# Patient Record
Sex: Female | Born: 1950
Health system: Southern US, Community
[De-identification: ages and names within clinical notes are randomized; demographics above are authoritative.]

## PROBLEM LIST (undated history)

## (undated) ENCOUNTER — Emergency Department (HOSPITAL_COMMUNITY): Payer: Medicare Other

## (undated) DIAGNOSIS — N390 Urinary tract infection, site not specified: Secondary | ICD-10-CM

## (undated) DIAGNOSIS — I10 Essential (primary) hypertension: Secondary | ICD-10-CM

## (undated) DIAGNOSIS — G8929 Other chronic pain: Secondary | ICD-10-CM

## (undated) DIAGNOSIS — F201 Disorganized schizophrenia: Secondary | ICD-10-CM

## (undated) DIAGNOSIS — E785 Hyperlipidemia, unspecified: Secondary | ICD-10-CM

## (undated) DIAGNOSIS — E1165 Type 2 diabetes mellitus with hyperglycemia: Secondary | ICD-10-CM

## (undated) DIAGNOSIS — I5022 Chronic systolic (congestive) heart failure: Secondary | ICD-10-CM

## (undated) DIAGNOSIS — I69391 Dysphagia following cerebral infarction: Secondary | ICD-10-CM

## (undated) DIAGNOSIS — F209 Schizophrenia, unspecified: Secondary | ICD-10-CM

## (undated) DIAGNOSIS — I639 Cerebral infarction, unspecified: Secondary | ICD-10-CM

## (undated) DIAGNOSIS — K5792 Diverticulitis of intestine, part unspecified, without perforation or abscess without bleeding: Secondary | ICD-10-CM

## (undated) DIAGNOSIS — I739 Peripheral vascular disease, unspecified: Secondary | ICD-10-CM

## (undated) DIAGNOSIS — I7123 Aneurysm of the descending thoracic aorta, without rupture: Secondary | ICD-10-CM

## (undated) DIAGNOSIS — I119 Hypertensive heart disease without heart failure: Secondary | ICD-10-CM

## (undated) DIAGNOSIS — I422 Other hypertrophic cardiomyopathy: Secondary | ICD-10-CM

## (undated) DIAGNOSIS — N182 Chronic kidney disease, stage 2 (mild): Secondary | ICD-10-CM

## (undated) DIAGNOSIS — I350 Nonrheumatic aortic (valve) stenosis: Secondary | ICD-10-CM

## (undated) DIAGNOSIS — M549 Dorsalgia, unspecified: Secondary | ICD-10-CM

## (undated) DIAGNOSIS — Z8659 Personal history of other mental and behavioral disorders: Secondary | ICD-10-CM

## (undated) DIAGNOSIS — E440 Moderate protein-calorie malnutrition: Secondary | ICD-10-CM

## (undated) DIAGNOSIS — I69322 Dysarthria following cerebral infarction: Secondary | ICD-10-CM

## (undated) DIAGNOSIS — I7121 Aneurysm of the ascending aorta, without rupture: Secondary | ICD-10-CM

## (undated) DIAGNOSIS — I2510 Atherosclerotic heart disease of native coronary artery without angina pectoris: Secondary | ICD-10-CM

## (undated) DIAGNOSIS — I420 Dilated cardiomyopathy: Secondary | ICD-10-CM

## (undated) DIAGNOSIS — I7143 Infrarenal abdominal aortic aneurysm, without rupture: Secondary | ICD-10-CM

## (undated) HISTORY — DX: Atherosclerotic heart disease of native coronary artery without angina pectoris: I25.10

## (undated) HISTORY — DX: Nonrheumatic aortic (valve) stenosis: I35.0

## (undated) HISTORY — DX: Hyperlipidemia, unspecified: E78.5

## (undated) HISTORY — DX: Other chronic pain: G89.29

## (undated) HISTORY — DX: Peripheral vascular disease, unspecified: I73.9

## (undated) HISTORY — DX: Infrarenal abdominal aortic aneurysm, without rupture: I71.43

## (undated) HISTORY — DX: Cerebral infarction, unspecified: I63.9

## (undated) HISTORY — PX: BUNIONECTOMY: SHX129

## (undated) HISTORY — DX: Other hypertrophic cardiomyopathy: I42.2

## (undated) HISTORY — DX: Dilated cardiomyopathy: I42.0

## (undated) HISTORY — DX: Disorganized schizophrenia: F20.1

## (undated) HISTORY — DX: Type 2 diabetes mellitus with hyperglycemia: E11.65

## (undated) HISTORY — DX: Personal history of other mental and behavioral disorders: Z86.59

## (undated) HISTORY — DX: Chronic kidney disease, stage 2 (mild): N18.2

## (undated) HISTORY — DX: Dorsalgia, unspecified: M54.9

## (undated) HISTORY — DX: Moderate protein-calorie malnutrition: E44.0

## (undated) HISTORY — DX: Dysphagia following cerebral infarction: I69.391

## (undated) HISTORY — DX: Aneurysm of the descending thoracic aorta, without rupture: I71.23

## (undated) HISTORY — DX: Chronic systolic (congestive) heart failure: I50.22

## (undated) HISTORY — DX: Aneurysm of the ascending aorta, without rupture: I71.21

## (undated) HISTORY — DX: Hypertensive heart disease without heart failure: I11.9

## (undated) HISTORY — DX: Dysarthria following cerebral infarction: I69.322

---

## 1997-06-23 ENCOUNTER — Inpatient Hospital Stay (HOSPITAL_COMMUNITY): Admission: AD | Admit: 1997-06-23 | Discharge: 1997-06-23 | Payer: Self-pay | Admitting: *Deleted

## 1998-01-24 ENCOUNTER — Emergency Department (HOSPITAL_COMMUNITY): Admission: EM | Admit: 1998-01-24 | Discharge: 1998-01-24 | Payer: Self-pay | Admitting: Emergency Medicine

## 1998-08-24 ENCOUNTER — Emergency Department (HOSPITAL_COMMUNITY): Admission: EM | Admit: 1998-08-24 | Discharge: 1998-08-24 | Payer: Self-pay | Admitting: Emergency Medicine

## 1998-10-08 ENCOUNTER — Emergency Department (HOSPITAL_COMMUNITY): Admission: EM | Admit: 1998-10-08 | Discharge: 1998-10-08 | Payer: Self-pay | Admitting: Emergency Medicine

## 1998-10-08 ENCOUNTER — Encounter: Payer: Self-pay | Admitting: Emergency Medicine

## 1998-11-03 ENCOUNTER — Emergency Department (HOSPITAL_COMMUNITY): Admission: EM | Admit: 1998-11-03 | Discharge: 1998-11-03 | Payer: Self-pay | Admitting: Emergency Medicine

## 1998-12-09 ENCOUNTER — Emergency Department (HOSPITAL_COMMUNITY): Admission: EM | Admit: 1998-12-09 | Discharge: 1998-12-09 | Payer: Self-pay | Admitting: Emergency Medicine

## 1999-02-15 ENCOUNTER — Emergency Department (HOSPITAL_COMMUNITY): Admission: EM | Admit: 1999-02-15 | Discharge: 1999-02-15 | Payer: Self-pay | Admitting: Emergency Medicine

## 1999-07-18 ENCOUNTER — Emergency Department (HOSPITAL_COMMUNITY): Admission: EM | Admit: 1999-07-18 | Discharge: 1999-07-18 | Payer: Self-pay | Admitting: Emergency Medicine

## 1999-08-24 ENCOUNTER — Emergency Department (HOSPITAL_COMMUNITY): Admission: EM | Admit: 1999-08-24 | Discharge: 1999-08-24 | Payer: Self-pay | Admitting: Emergency Medicine

## 1999-08-24 ENCOUNTER — Encounter: Payer: Self-pay | Admitting: Emergency Medicine

## 1999-09-27 ENCOUNTER — Emergency Department (HOSPITAL_COMMUNITY): Admission: EM | Admit: 1999-09-27 | Discharge: 1999-09-27 | Payer: Self-pay | Admitting: *Deleted

## 1999-10-26 ENCOUNTER — Emergency Department (HOSPITAL_COMMUNITY): Admission: EM | Admit: 1999-10-26 | Discharge: 1999-10-26 | Payer: Self-pay | Admitting: Emergency Medicine

## 1999-12-06 ENCOUNTER — Inpatient Hospital Stay (HOSPITAL_COMMUNITY): Admission: EM | Admit: 1999-12-06 | Discharge: 1999-12-07 | Payer: Self-pay | Admitting: *Deleted

## 2003-02-17 ENCOUNTER — Emergency Department (HOSPITAL_COMMUNITY): Admission: EM | Admit: 2003-02-17 | Discharge: 2003-02-17 | Payer: Self-pay | Admitting: Emergency Medicine

## 2003-03-25 ENCOUNTER — Emergency Department (HOSPITAL_COMMUNITY): Admission: EM | Admit: 2003-03-25 | Discharge: 2003-03-25 | Payer: Self-pay | Admitting: Emergency Medicine

## 2003-04-03 ENCOUNTER — Ambulatory Visit (HOSPITAL_COMMUNITY): Admission: RE | Admit: 2003-04-03 | Discharge: 2003-04-03 | Payer: Self-pay | Admitting: Orthopedic Surgery

## 2003-04-15 ENCOUNTER — Ambulatory Visit (HOSPITAL_COMMUNITY): Admission: RE | Admit: 2003-04-15 | Discharge: 2003-04-15 | Payer: Self-pay | Admitting: Orthopedic Surgery

## 2003-04-15 ENCOUNTER — Ambulatory Visit (HOSPITAL_BASED_OUTPATIENT_CLINIC_OR_DEPARTMENT_OTHER): Admission: RE | Admit: 2003-04-15 | Discharge: 2003-04-16 | Payer: Self-pay | Admitting: Orthopedic Surgery

## 2003-07-01 ENCOUNTER — Encounter: Admission: RE | Admit: 2003-07-01 | Discharge: 2003-09-29 | Payer: Self-pay | Admitting: Orthopedic Surgery

## 2003-09-30 ENCOUNTER — Encounter: Admission: RE | Admit: 2003-09-30 | Discharge: 2003-10-29 | Payer: Self-pay | Admitting: Orthopedic Surgery

## 2003-12-23 ENCOUNTER — Inpatient Hospital Stay (HOSPITAL_COMMUNITY): Admission: RE | Admit: 2003-12-23 | Discharge: 2003-12-25 | Payer: Self-pay | Admitting: Orthopedic Surgery

## 2004-01-26 ENCOUNTER — Encounter: Admission: RE | Admit: 2004-01-26 | Discharge: 2004-04-25 | Payer: Self-pay | Admitting: Orthopedic Surgery

## 2004-02-23 ENCOUNTER — Emergency Department (HOSPITAL_COMMUNITY): Admission: EM | Admit: 2004-02-23 | Discharge: 2004-02-23 | Payer: Self-pay | Admitting: Emergency Medicine

## 2004-04-26 ENCOUNTER — Encounter: Admission: RE | Admit: 2004-04-26 | Discharge: 2004-05-31 | Payer: Self-pay | Admitting: Orthopedic Surgery

## 2004-10-06 ENCOUNTER — Encounter: Admission: RE | Admit: 2004-10-06 | Discharge: 2004-10-06 | Payer: Self-pay | Admitting: Family Medicine

## 2004-10-19 ENCOUNTER — Encounter: Admission: RE | Admit: 2004-10-19 | Discharge: 2004-10-19 | Payer: Self-pay | Admitting: Family Medicine

## 2004-12-15 ENCOUNTER — Emergency Department (HOSPITAL_COMMUNITY): Admission: EM | Admit: 2004-12-15 | Discharge: 2004-12-15 | Payer: Self-pay | Admitting: Emergency Medicine

## 2004-12-25 ENCOUNTER — Emergency Department (HOSPITAL_COMMUNITY): Admission: EM | Admit: 2004-12-25 | Discharge: 2004-12-25 | Payer: Self-pay | Admitting: Emergency Medicine

## 2005-02-11 ENCOUNTER — Emergency Department (HOSPITAL_COMMUNITY): Admission: EM | Admit: 2005-02-11 | Discharge: 2005-02-12 | Payer: Self-pay | Admitting: Emergency Medicine

## 2005-02-13 ENCOUNTER — Emergency Department (HOSPITAL_COMMUNITY): Admission: EM | Admit: 2005-02-13 | Discharge: 2005-02-13 | Payer: Self-pay | Admitting: Emergency Medicine

## 2005-02-23 ENCOUNTER — Emergency Department (HOSPITAL_COMMUNITY): Admission: EM | Admit: 2005-02-23 | Discharge: 2005-02-23 | Payer: Self-pay | Admitting: Emergency Medicine

## 2005-02-24 ENCOUNTER — Other Ambulatory Visit: Admission: RE | Admit: 2005-02-24 | Discharge: 2005-02-24 | Payer: Self-pay | Admitting: Obstetrics and Gynecology

## 2005-02-26 ENCOUNTER — Inpatient Hospital Stay (HOSPITAL_COMMUNITY): Admission: AD | Admit: 2005-02-26 | Discharge: 2005-02-28 | Payer: Self-pay | Admitting: Psychiatry

## 2005-02-26 ENCOUNTER — Ambulatory Visit: Payer: Self-pay | Admitting: Psychiatry

## 2005-06-29 ENCOUNTER — Emergency Department (HOSPITAL_COMMUNITY): Admission: EM | Admit: 2005-06-29 | Discharge: 2005-06-29 | Payer: Self-pay | Admitting: Emergency Medicine

## 2005-07-11 ENCOUNTER — Ambulatory Visit: Payer: Self-pay | Admitting: Pulmonary Disease

## 2005-07-11 ENCOUNTER — Inpatient Hospital Stay (HOSPITAL_COMMUNITY): Admission: EM | Admit: 2005-07-11 | Discharge: 2005-07-14 | Payer: Self-pay | Admitting: Emergency Medicine

## 2005-08-20 ENCOUNTER — Inpatient Hospital Stay (HOSPITAL_COMMUNITY): Admission: AD | Admit: 2005-08-20 | Discharge: 2005-08-20 | Payer: Self-pay | Admitting: Obstetrics and Gynecology

## 2005-09-09 ENCOUNTER — Emergency Department (HOSPITAL_COMMUNITY): Admission: EM | Admit: 2005-09-09 | Discharge: 2005-09-10 | Payer: Self-pay | Admitting: Emergency Medicine

## 2005-10-05 ENCOUNTER — Emergency Department (HOSPITAL_COMMUNITY): Admission: EM | Admit: 2005-10-05 | Discharge: 2005-10-06 | Payer: Self-pay | Admitting: Emergency Medicine

## 2005-10-11 ENCOUNTER — Emergency Department (HOSPITAL_COMMUNITY): Admission: EM | Admit: 2005-10-11 | Discharge: 2005-10-12 | Payer: Self-pay | Admitting: Emergency Medicine

## 2005-10-13 ENCOUNTER — Emergency Department (HOSPITAL_COMMUNITY): Admission: EM | Admit: 2005-10-13 | Discharge: 2005-10-13 | Payer: Self-pay | Admitting: Emergency Medicine

## 2005-10-18 ENCOUNTER — Emergency Department (HOSPITAL_COMMUNITY): Admission: EM | Admit: 2005-10-18 | Discharge: 2005-10-18 | Payer: Self-pay | Admitting: *Deleted

## 2009-01-04 DIAGNOSIS — I420 Dilated cardiomyopathy: Secondary | ICD-10-CM

## 2009-01-04 HISTORY — DX: Dilated cardiomyopathy: I42.0

## 2009-02-01 ENCOUNTER — Inpatient Hospital Stay (HOSPITAL_COMMUNITY): Admission: EM | Admit: 2009-02-01 | Discharge: 2009-02-06 | Payer: Self-pay | Admitting: Emergency Medicine

## 2009-02-02 ENCOUNTER — Encounter (INDEPENDENT_AMBULATORY_CARE_PROVIDER_SITE_OTHER): Payer: Self-pay | Admitting: Cardiovascular Disease

## 2009-03-06 HISTORY — PX: NM MYOCAR PERF WALL MOTION: HXRAD629

## 2009-04-06 DIAGNOSIS — I2510 Atherosclerotic heart disease of native coronary artery without angina pectoris: Secondary | ICD-10-CM

## 2009-04-06 HISTORY — DX: Atherosclerotic heart disease of native coronary artery without angina pectoris: I25.10

## 2009-04-09 ENCOUNTER — Ambulatory Visit (HOSPITAL_COMMUNITY)
Admission: RE | Admit: 2009-04-09 | Discharge: 2009-04-10 | Payer: Self-pay | Source: Home / Self Care | Admitting: Cardiology

## 2010-03-27 ENCOUNTER — Encounter: Payer: Self-pay | Admitting: Family Medicine

## 2010-04-23 ENCOUNTER — Encounter (HOSPITAL_COMMUNITY): Payer: Self-pay | Admitting: Radiology

## 2010-04-23 ENCOUNTER — Emergency Department (HOSPITAL_COMMUNITY)
Admission: EM | Admit: 2010-04-23 | Discharge: 2010-04-23 | Disposition: A | Discharge status: Home / Self Care | Payer: Medicare Other | Attending: Emergency Medicine | Admitting: Emergency Medicine

## 2010-04-23 ENCOUNTER — Emergency Department (HOSPITAL_COMMUNITY): Payer: Medicare Other

## 2010-04-23 DIAGNOSIS — J029 Acute pharyngitis, unspecified: Secondary | ICD-10-CM | POA: Insufficient documentation

## 2010-04-23 DIAGNOSIS — N83209 Unspecified ovarian cyst, unspecified side: Secondary | ICD-10-CM | POA: Insufficient documentation

## 2010-04-23 DIAGNOSIS — R109 Unspecified abdominal pain: Secondary | ICD-10-CM | POA: Insufficient documentation

## 2010-04-23 DIAGNOSIS — I10 Essential (primary) hypertension: Secondary | ICD-10-CM | POA: Insufficient documentation

## 2010-04-23 DIAGNOSIS — R112 Nausea with vomiting, unspecified: Secondary | ICD-10-CM | POA: Insufficient documentation

## 2010-04-23 DIAGNOSIS — J3489 Other specified disorders of nose and nasal sinuses: Secondary | ICD-10-CM | POA: Insufficient documentation

## 2010-04-23 DIAGNOSIS — J069 Acute upper respiratory infection, unspecified: Secondary | ICD-10-CM | POA: Insufficient documentation

## 2010-04-23 HISTORY — DX: Essential (primary) hypertension: I10

## 2010-04-23 LAB — CBC
HCT: 37.9 % (ref 36.0–46.0)
Hemoglobin: 13 g/dL (ref 12.0–15.0)
MCV: 90.7 fL (ref 78.0–100.0)
Platelets: 332 10*3/uL (ref 150–400)
RBC: 4.18 MIL/uL (ref 3.87–5.11)
RDW: 13.1 % (ref 11.5–15.5)
WBC: 10.4 10*3/uL (ref 4.0–10.5)

## 2010-04-23 LAB — COMPREHENSIVE METABOLIC PANEL
ALT: 15 U/L (ref 0–35)
AST: 19 U/L (ref 0–37)
Albumin: 3.8 g/dL (ref 3.5–5.2)
Alkaline Phosphatase: 80 U/L (ref 39–117)
BUN: 13 mg/dL (ref 6–23)
CO2: 28 mEq/L (ref 19–32)
Chloride: 99 mEq/L (ref 96–112)
Creatinine, Ser: 1.56 mg/dL — ABNORMAL HIGH (ref 0.4–1.2)
GFR calc Af Amer: 41 mL/min — ABNORMAL LOW (ref >60)
GFR calc non Af Amer: 34 mL/min — ABNORMAL LOW (ref >60)
Glucose, Bld: 128 mg/dL — ABNORMAL HIGH (ref 70–99)
Potassium: 3.8 mEq/L (ref 3.5–5.1)
Sodium: 135 mEq/L (ref 135–145)
Total Bilirubin: 0.4 mg/dL (ref 0.3–1.2)
Total Protein: 8.3 g/dL (ref 6.0–8.3)

## 2010-04-23 LAB — LIPASE, BLOOD: Lipase: 103 U/L — ABNORMAL HIGH (ref 11–59)

## 2010-04-23 LAB — URINALYSIS, ROUTINE W REFLEX MICROSCOPIC
Bilirubin Urine: NEGATIVE
Ketones, ur: NEGATIVE mg/dL
Protein, ur: 30 mg/dL — AB
Specific Gravity, Urine: 1.036 — ABNORMAL HIGH (ref 1.005–1.030)
Urine Glucose, Fasting: NEGATIVE mg/dL
Urobilinogen, UA: 0.2 mg/dL (ref 0.0–1.0)
pH: 6.5 (ref 5.0–8.0)

## 2010-04-23 LAB — DIFFERENTIAL
Basophils Absolute: 0 10*3/uL (ref 0.0–0.1)
Basophils Relative: 0 % (ref 0–1)
Eosinophils Absolute: 0.1 10*3/uL (ref 0.0–0.7)
Eosinophils Relative: 1 % (ref 0–5)
Lymphocytes Relative: 16 % (ref 12–46)
Lymphs Abs: 1.6 10*3/uL (ref 0.7–4.0)
Monocytes Absolute: 0.5 10*3/uL (ref 0.1–1.0)
Monocytes Relative: 5 % (ref 3–12)

## 2010-04-23 LAB — URINE MICROSCOPIC-ADD ON

## 2010-04-23 MED ORDER — IOHEXOL 300 MG/ML  SOLN
100.0000 mL | Freq: Once | INTRAMUSCULAR | Status: AC | PRN
  Administered 2010-04-23: 100 mL via INTRAVENOUS

## 2010-05-27 LAB — POCT I-STAT 3, ART BLOOD GAS (G3+)
Acid-Base Excess: 3 mmol/L — ABNORMAL HIGH (ref 0.0–2.0)
Bicarbonate: 26.7 mEq/L — ABNORMAL HIGH (ref 20.0–24.0)
O2 Saturation: 96 %
pO2, Arterial: 75 mmHg — ABNORMAL LOW (ref 80.0–100.0)

## 2010-05-27 LAB — POCT I-STAT 3, VENOUS BLOOD GAS (G3P V)
Bicarbonate: 27.2 mEq/L — ABNORMAL HIGH (ref 20.0–24.0)
O2 Saturation: 62 %
TCO2: 29 mmol/L (ref 0–100)
pH, Ven: 7.4 — ABNORMAL HIGH (ref 7.250–7.300)

## 2010-05-27 LAB — BASIC METABOLIC PANEL
BUN: 19 mg/dL (ref 6–23)
CO2: 29 mEq/L (ref 19–32)
Calcium: 9 mg/dL (ref 8.4–10.5)
Calcium: 9 mg/dL (ref 8.4–10.5)
Chloride: 102 mEq/L (ref 96–112)
Creatinine, Ser: 1.37 mg/dL — ABNORMAL HIGH (ref 0.4–1.2)
GFR calc Af Amer: 48 mL/min — ABNORMAL LOW (ref >60)
GFR calc Af Amer: 50 mL/min — ABNORMAL LOW (ref >60)
GFR calc non Af Amer: 40 mL/min — ABNORMAL LOW (ref >60)
GFR calc non Af Amer: 41 mL/min — ABNORMAL LOW (ref >60)
Glucose, Bld: 106 mg/dL — ABNORMAL HIGH (ref 70–99)
Potassium: 3.2 mEq/L — ABNORMAL LOW (ref 3.5–5.1)
Potassium: 3.3 mEq/L — ABNORMAL LOW (ref 3.5–5.1)
Sodium: 137 mEq/L (ref 135–145)
Sodium: 138 mEq/L (ref 135–145)

## 2010-05-27 LAB — URINALYSIS, MICROSCOPIC ONLY
Bilirubin Urine: NEGATIVE
Nitrite: NEGATIVE
Protein, ur: NEGATIVE mg/dL
Specific Gravity, Urine: 1.018 (ref 1.005–1.030)
Urobilinogen, UA: 0.2 mg/dL (ref 0.0–1.0)

## 2010-05-27 LAB — CBC
MCHC: 33.8 g/dL (ref 30.0–36.0)
MCV: 89.3 fL (ref 78.0–100.0)
Platelets: 232 10*3/uL (ref 150–400)
RDW: 18 % — ABNORMAL HIGH (ref 11.5–15.5)

## 2010-05-27 LAB — URINALYSIS, ROUTINE W REFLEX MICROSCOPIC
Bilirubin Urine: NEGATIVE
Glucose, UA: NEGATIVE mg/dL
Hgb urine dipstick: NEGATIVE
Protein, ur: NEGATIVE mg/dL
Urobilinogen, UA: 0.2 mg/dL (ref 0.0–1.0)

## 2010-05-27 LAB — GLUCOSE, CAPILLARY
Glucose-Capillary: 107 mg/dL — ABNORMAL HIGH (ref 70–99)
Glucose-Capillary: 94 mg/dL (ref 70–99)

## 2010-05-27 LAB — PROTIME-INR
INR: 1.1 (ref 0.00–1.49)
Prothrombin Time: 14.1 seconds (ref 11.6–15.2)

## 2010-06-07 LAB — LIPID PANEL
LDL Cholesterol: 57 mg/dL (ref 0–99)
VLDL: 16 mg/dL (ref 0–40)

## 2010-06-07 LAB — BASIC METABOLIC PANEL
BUN: 16 mg/dL (ref 6–23)
BUN: 16 mg/dL (ref 6–23)
BUN: 25 mg/dL — ABNORMAL HIGH (ref 6–23)
CO2: 31 mEq/L (ref 19–32)
Calcium: 8.2 mg/dL — ABNORMAL LOW (ref 8.4–10.5)
Calcium: 8.6 mg/dL (ref 8.4–10.5)
Chloride: 106 mEq/L (ref 96–112)
Chloride: 99 mEq/L (ref 96–112)
Creatinine, Ser: 1.39 mg/dL — ABNORMAL HIGH (ref 0.4–1.2)
Creatinine, Ser: 1.7 mg/dL — ABNORMAL HIGH (ref 0.4–1.2)
GFR calc Af Amer: 37 mL/min — ABNORMAL LOW (ref >60)
GFR calc Af Amer: 46 mL/min — ABNORMAL LOW (ref >60)
GFR calc non Af Amer: 31 mL/min — ABNORMAL LOW (ref >60)
GFR calc non Af Amer: 39 mL/min — ABNORMAL LOW (ref >60)
Glucose, Bld: 106 mg/dL — ABNORMAL HIGH (ref 70–99)
Glucose, Bld: 89 mg/dL (ref 70–99)
Potassium: 4 mEq/L (ref 3.5–5.1)
Potassium: 4 mEq/L (ref 3.5–5.1)
Sodium: 139 mEq/L (ref 135–145)

## 2010-06-07 LAB — CBC
HCT: 34.7 % — ABNORMAL LOW (ref 36.0–46.0)
MCV: 93.3 fL (ref 78.0–100.0)
Platelets: 263 10*3/uL (ref 150–400)
RDW: 15.8 % — ABNORMAL HIGH (ref 11.5–15.5)

## 2010-06-07 LAB — BRAIN NATRIURETIC PEPTIDE: Pro B Natriuretic peptide (BNP): 673 pg/mL — ABNORMAL HIGH (ref 0.0–100.0)

## 2010-06-07 LAB — MAGNESIUM: Magnesium: 1.8 mg/dL (ref 1.5–2.5)

## 2010-06-08 LAB — GLUCOSE, CAPILLARY
Glucose-Capillary: 117 mg/dL — ABNORMAL HIGH (ref 70–99)
Glucose-Capillary: 119 mg/dL — ABNORMAL HIGH (ref 70–99)

## 2010-06-08 LAB — BASIC METABOLIC PANEL
BUN: 32 mg/dL — ABNORMAL HIGH (ref 6–23)
CO2: 25 mEq/L (ref 19–32)
Calcium: 8.7 mg/dL (ref 8.4–10.5)
Chloride: 110 mEq/L (ref 96–112)
Creatinine, Ser: 1.82 mg/dL — ABNORMAL HIGH (ref 0.4–1.2)
GFR calc Af Amer: 33 mL/min — ABNORMAL LOW (ref >60)
GFR calc Af Amer: 35 mL/min — ABNORMAL LOW (ref >60)
GFR calc non Af Amer: 27 mL/min — ABNORMAL LOW (ref >60)
GFR calc non Af Amer: 29 mL/min — ABNORMAL LOW (ref >60)
Glucose, Bld: 114 mg/dL — ABNORMAL HIGH (ref 70–99)
Potassium: 4 mEq/L (ref 3.5–5.1)
Sodium: 140 mEq/L (ref 135–145)

## 2010-06-08 LAB — COMPREHENSIVE METABOLIC PANEL
Albumin: 2.8 g/dL — ABNORMAL LOW (ref 3.5–5.2)
Alkaline Phosphatase: 85 U/L (ref 39–117)
BUN: 27 mg/dL — ABNORMAL HIGH (ref 6–23)
CO2: 23 mEq/L (ref 19–32)
Chloride: 112 mEq/L (ref 96–112)
Glucose, Bld: 115 mg/dL — ABNORMAL HIGH (ref 70–99)
Potassium: 3.8 mEq/L (ref 3.5–5.1)
Total Bilirubin: 1.5 mg/dL — ABNORMAL HIGH (ref 0.3–1.2)

## 2010-06-08 LAB — TROPONIN I: Troponin I: 0.47 ng/mL — ABNORMAL HIGH (ref 0.00–0.06)

## 2010-06-08 LAB — URINALYSIS, ROUTINE W REFLEX MICROSCOPIC
Glucose, UA: NEGATIVE mg/dL
Ketones, ur: NEGATIVE mg/dL
Nitrite: NEGATIVE
Specific Gravity, Urine: 1.01 (ref 1.005–1.030)
pH: 5 (ref 5.0–8.0)

## 2010-06-08 LAB — CBC
HCT: 36.1 % (ref 36.0–46.0)
Hemoglobin: 11.7 g/dL — ABNORMAL LOW (ref 12.0–15.0)
Hemoglobin: 12 g/dL (ref 12.0–15.0)
MCHC: 32.8 g/dL (ref 30.0–36.0)
Platelets: 252 10*3/uL (ref 150–400)
RDW: 16 % — ABNORMAL HIGH (ref 11.5–15.5)
WBC: 5.8 10*3/uL (ref 4.0–10.5)

## 2010-06-08 LAB — BRAIN NATRIURETIC PEPTIDE
Pro B Natriuretic peptide (BNP): >3200 pg/mL — ABNORMAL HIGH (ref 0.0–100.0)
Pro B Natriuretic peptide (BNP): >3200 pg/mL — ABNORMAL HIGH (ref 0.0–100.0)

## 2010-06-08 LAB — TSH: TSH: 3.219 u[IU]/mL (ref 0.350–4.500)

## 2010-06-08 LAB — DIFFERENTIAL
Basophils Absolute: 0 10*3/uL (ref 0.0–0.1)
Basophils Relative: 0 % (ref 0–1)
Monocytes Absolute: 0.5 10*3/uL (ref 0.1–1.0)
Neutro Abs: 3.5 10*3/uL (ref 1.7–7.7)
Neutrophils Relative %: 60 % (ref 43–77)

## 2010-06-08 LAB — POCT CARDIAC MARKERS: CKMB, poc: 1.6 ng/mL (ref 1.0–8.0)

## 2010-06-08 LAB — CK TOTAL AND CKMB (NOT AT ARMC)
CK, MB: 2.9 ng/mL (ref 0.3–4.0)
CK, MB: 3.4 ng/mL (ref 0.3–4.0)
Relative Index: 2.4 (ref 0.0–2.5)
Total CK: 172 U/L (ref 7–177)
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
## 2010-07-22 NOTE — H&P (Signed)
[facility]  Patient:    Erin Riley, Erin Riley                          MRN: [id number] Adm. Date:  [id number] Disc. Date: [id number] Attending:  Otilio Saber Dictator:   Johnella Moloney, N.P.                         History and Physical  IDENTIFYING INFORMATION:  Erin Riley is a 60 year old African-American married (?) female, admitted 12/06/99 on involuntary commitment via [facility] for psychotic behavior and a danger to herself due to inability to care for herself.  The patient is a poor historian and she refuses to cooperate.  So most of this information is recorded from the chart, from her sister, and information obtained by the ACT team and nursing staff.  HISTORY OF PRESENT ILLNESS:  Again, the patient refused to answer any questions, maybe answered two.  The patient was petitioned by her sister.  The petitionist stated that she was extremely paranoid, President Joni Fears was talking about her on TV today, claims her sister and her husband have computers in their homes and are watching.  She cannot sleep at night, does during the day.  Claims sister has been "rubbing on her behind through the computer."  According to the medical doctor who examined her at [facility], she was paranoid, thinking people had broken into her home, thought that someone unplugged the refrigerator and hit her in the head.  Her thoughts were very disorganized.  She was clearly unable to care for herself.  The patient states that she is not mentally ill, that she is going home, according to the nursing notes.  There has been no problems with appetite.  PAST PSYCHIATRIC HISTORY:  As best as we could determine, the patient has been to [facility] in the past on a commitment in 2000.  There were two previous admissions.  SOCIAL HISTORY:  All we have is that she lives alone, four children.  FAMILY HISTORY:  Unknown.  ALCOHOL AND  DRUG HISTORY:  Unknown.  PAST MEDICAL HISTORY:  Primary care Anuradha Chabot: [facility].  She said that she was last seen one week ago.  Medical problems: Hypertension; otherwise, unknown.  MEDICATIONS:   Verapamil, unknown dose.  The patient has not been taking this.  The family could not give [place] a list of medications.  She apparently has been noncompliant with all her medication.  She was supposed to be on Risperdal 1 mg b.i.d. and Verapamil, dose unknown.  DRUG ALLERGIES:  No known drug allergies.  PHYSICAL EXAMINATION:  The patient is extremely agitated.  She refuses a physical examination and basically is refusing to cooperate.  On 10/2 on admission, her blood pressure 193/118 and 211/137.  Dr. Claudette Head was called and Verapamil SR 240 mg q.d. was ordered by Dr. Claudette Head.  This morning, her blood pressure was 214/127 sitting, standing 218/129.  It did drop slightly during the night to 172/106.  Temperature 97.2, pulse 90, respirations 20, height 67 inches, weight 230.5 pounds.  MENTAL STATUS EXAMINATION:  A large African-American female lying in bed. Uncooperative, pacing some, and becoming irritable with the staff.  Speech: Immediately when I saw her, she would not talk.  She made an occasional comment; however she has since gotten up and apparently is agitated and is demanding to leave, refusing  to take medicine.  Mood: Irritable.  Affect: Labile, hostile.  Denies suicidal ideation, denies homicidal ideation. Thought process: Visual, auditory, and tactile hallucinations, paranoid, delusional, disorganized thinking, thought insertion.  Cognitive: Unable to ascertain at this time due to the patient being uncooperative secondary to her psychosis.  CURRENT DIAGNOSES: Axis I:    1. Psychotic disorder, not otherwise specified.            2. Rule out schizophrenia. Axis II:   Deferred. Axis III:  Hypertension. Axis IV:   Severe related to problems with social environment, medical             problems, and her mental illness. Axis V:    Current global assessment of functioning 20, highest in the past            year 60.  TREATMENT PLAN AND RECOMMENDATIONS:  Involuntary commitment to [facility].  Check every 15 minutes to maintain safety.  Attempt to stabilize psychosis.  Restart the Risperdal 1 mg b.i.d. p.o., Seroquel 25 h.s. p.o., Seroquel 25 mg q.4h. p.r.n. for agitation, Verapamil SR 240 mg q.d. per Dr. Claudette Head.  On 12/07/99, her blood pressure continued to be high.  Dr. Claudette Head ordered clonidine 0.1 mg p.o. stat along with bedrest while her blood pressure was elevated and to check her blood pressure frequently.  The patient is refusing all medications except the blood pressure medications at this time.  TENTATIVE LENGTH OF STAY AND DISCHARGE PLAN:  Four to five days. DD:  12/07/99 TD:  12/07/99 Job: [id number]

## 2010-07-22 NOTE — Op Note (Signed)
NAME:  Erin Riley, Erin Riley                             ACCOUNT NO.:  [id number]   MEDICAL RECORD NO.:  [id number]                   PATIENT TYPE:  AMB   LOCATION:  DSC                                  FACILITY:  [facility]   PHYSICIAN:  Thera Flake., M.D.             DATE OF BIRTH:  03-11-1950   DATE OF PROCEDURE:  04/15/2003  DATE OF DISCHARGE:                                 OPERATIVE REPORT   INDICATIONS FOR PROCEDURE:  60 year old female with partial quad tear and  torn lateral meniscus thought to be amenable to overnight hospitalization,  arthroscopy, and repair.   PREOPERATIVE DIAGNOSIS:  1. Torn lateral meniscus, right knee.  2. 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  3. Partial quad tear, right knee.   POSTOPERATIVE DIAGNOSIS:  1. Torn lateral meniscus, right knee.  2. 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  3. Partial quad tear, right knee.   OPERATION:  1. Arthroscopic lateral medial meniscectomy, right knee.  2. Debridement chondroplasty (tricompartmental), right knee.  3. Open quad tendon repair, right knee.   SURGEON:  Dyke Brackett, M.D.   ASSISTANT:   TOURNIQUET TIME:  Approximately 70 minutes for the procedure.   DESCRIPTION OF PROCEDURE:  She was arthroscoped through an inferomedial and  inferolateral portal.  Systematic inspection of the knee showed the patient  to have a torn lateral meniscus with grade 3 chondromalacia of the medial  compartment and lateral compartment, particularly laterally.  These were on  both sides of the joint.  There was a fraying type tear requiring resection  of about 15-20% of the posterior horn lateral meniscus debrided.  Patellofemoral arthrosis was encountered which required debridement, as  well.  The debridement of the patellofemoral joint medial and lateral  compartment as well as lateral meniscectomy was carried out.  The portals  were closed.  A longitudinal incision was made centered on the rectus tendon  where MRI had shown a partial lesion.  Indeed, there was attenuation and  retraction of the central part of the rectus that had healed back in an  elongated position.  The edges of the insertion were degenerative.  They  were freshened with a rongeur and split, tagged with #5 FiberWire sutures  placed through drill holes in the patella, to repair the partial lesion,  this was oversewn with #1  Ethibond which created an excellent repair.  Attenuated tendon was  encountered and repaired.  Closure was affected with 2-0 Vicryl and skin  clips.  Marcaine with epinephrine on the skin.  A lightly compressive  sterile dressing and knee immobilizer was applied.  Thera Flake., M.D.    WDC/MEDQ  D:  04/15/2003  T:  04/15/2003  Job:  [id number]

## 2010-07-22 NOTE — Discharge Summary (Signed)
Erin Riley, Erin Riley                   ACCOUNT NO.:  [id number]   MEDICAL RECORD NO.:  [id number]          PATIENT TYPE:  INP   LOCATION:  1511                         FACILITY:  [facility]   PHYSICIAN:  Lonia Blood, M.D.       DATE OF BIRTH:  [birth year]   DATE OF ADMISSION:  07/11/2005  DATE OF DISCHARGE:  07/14/2005                                 DISCHARGE SUMMARY   DISCHARGE DIAGNOSES:  1.  Hypertensive emergency, resolved.  2.  Schizophrenia with paranoid delusions.  3.  Osteoarthritis of the right shoulder status post hemiarthroplasty by Dr.      Lequita Halt in October 2005.  4.  Obesity.   DISCHARGE MEDICATIONS:  1.  Klonopin 0.2 mg by mouth twice a day.  2.  Hydralazine 10 mg by mouth twice a day.  3.  Dyazide 25/30 7.5 mg daily.  4.  Zyprexa mg at bedtime.   CONDITION ON DISCHARGE:  Erin Riley was discharged in good condition. At the  time of discharge she was instructed to follow-up with her primary care  physician Dr. Billee Cashing and to follow-up at [facility]  outpatient clinic.   For admission history and physical, the patient was admitted by Dr. Craige Cotta  from critical care services for a hypertensive crisis.  Briefly, the patient  presented to [facility] emergency room on Jul 12, 2005 with  complaints of chest pain, headaches and with elevated blood pressure  readings. In fact, in the emergency room the patient was found to have a  systolic blood pressure of 229 with a diastolic blood pressure 105.   The patient was admitted to the intensive care unit at [facility]  where she was started on intravenous Nipride and Labetalol together with  oral clonidine and hydralazine.   HOSPITAL COURSE:  Problem #1. Hypertensive crisis.  Erin Riley was admitted  to [facility] intensive care unit. She was started on intravenous  antihypertensives.  The patient did respond very well to the treatment and  by hospital day #2 she was switched to oral clonidine,  hydralazine and the  diuretic.  The patient's blood pressure levels remained fairly stable during  her hospitalization and at the time of discharge her systolic blood pressure  is 137, diastolic 90.  The patient was instructed to follow up with her  primary care physician for further hypertensive treatment.   Problem #2. Paranoid schizophrenia. The patient was seen in consultation by  Dr. Antonietta Breach from psychiatry who started the patient on Zyprexa 10 mg  daily.  The patient did seem to respond fairly well to the treatment and it  was deemed safe to discharge the patient home with outpatient psychiatric  follow-up.      Lonia Blood, M.D.  Electronically Signed     SL/MEDQ  D:  07/14/2005  T:  07/16/2005  Job:  [id number]   cc:   Lorelle Formosa, M.D.  Fax: [phone]

## 2010-07-22 NOTE — Op Note (Signed)
Erin, Riley                   ACCOUNT NO.:  [id number]   MEDICAL RECORD NO.:  [id number]          PATIENT TYPE:  INP   LOCATION:  0005                         FACILITY:  [facility]   PHYSICIAN:  Ollen Gross, M.D.    DATE OF BIRTH:  1951-02-28   DATE OF PROCEDURE:  12/23/2003  DATE OF DISCHARGE:                                 OPERATIVE REPORT   PREOPERATIVE DIAGNOSIS:  Right shoulder rotator cuff tear arthropathy.   POSTOPERATIVE DIAGNOSIS:  Right shoulder rotator cuff tear arthropathy.   OPERATION PERFORMED:  Right shoulder hemiarthroplasty.   SURGEON:  Ollen Gross, M.D.   ASSISTANT:  Alexzandrew L. Julien Girt, P.A.   ANESTHESIA:  General with interscalene block.   ESTIMATED BLOOD LOSS:  200 mL.   DRAINS:  None.   COMPLICATIONS:  None.  Condition stable to recovery.   INDICATIONS FOR PROCEDURE:  Erin Riley is a 60 year old female who has a  rotator cuff tear arthropathy of the right shoulder with severe degenerative  change, high riding humeral head and large osteophyte formation.  She has  intractable right shoulder pain and presents now for hemiarthroplasty.  She  had several injections by Dr. Kristeen Miss and the injections are no longer  working.   DESCRIPTION OF PROCEDURE:  After the successful administration of  interscalene block and general anesthetic, the patient was placed in the  beach chair position and her right upper extremity and shoulder girdle were  isolated from her trunk with plastic drapes, prepped and draped in the usual  sterile fashion.  I made the incision line along the deltopectoral interval  and cut the skin with 15 blade through the subcutaneous tissue to the level  of the deltopectoral interval.  We carefully dissected the cephalic vein and  retracted it laterally with the deltoid.  I incised the clavipectoral fascia  and released the superior one third of the pec tendon.  The shoulder was  externally rotated and a vertical incision was made  in the subscapularis  tendon and the tendon's medial side was then tagged with  Ethibond sutures.  The shoulder was then dislocated.  We then removed the osteophytes off the  inferior humeral neck and place the cutting template on the anterior  cervical humerus.  The osteotomy line was then marked and osteotomy made  with an oscillating saw.  I made the osteotomy with the arm externally  rotated 30 degrees to give 30 degrees of retroversion.  The osteophytes were  then removed around the rim of the osteotomy line.  We then reamed the canal  up to size 12 and broached starting at 8 up to 12.  The broach was placed in  30 degrees of retroversion.  She had no rotator cuff left, thus we were  going to do the CTA cuff tear arthropathy head and I made the additional cut  for that.  Based on the templating and the head that were removed, she had a  52 mm circumference and we used a 52 x 18 trial.  With the trial she had a  very stable  reduction with about 50% inferior and 50% posterior translation.  I removed some loose bodies from the glenoid, inspected the glenoid and it  was in condition with some chondromalacia but no full thickness defects.  Given that she had no rotator cuff, we were not going to put a glenoid  component.  We then removed the trials and placed the Depuy size 12 global,  humeral stem.  I then placed the 52 x 18 CTA head.  The shoulder was reduced  with the same stability.  The wound was copiously irrigated with saline  solution and the subscapularis repaired with interrupted #1 Ethibond  sutures.  It was felt to be a very stable repair.  The shoulder remained  reduced throughout the full abduction and full forward elevation.  I could  externally rotate to 20 degrees before tension was placed on the suture  line.  The deltopectoral interval was then tacked closed with interrupted #1  Vicryl, subcu closed with interrupted 2-0 Vicryl and subcuticular running 4-  0 Monocryl.   Incision was clean and dry and Steri-Strips and a bulky sterile  dressing applied.  She was then placed into a shoulder immobilizer, awakened  and transported to recovery in stable condition.      FA/MEDQ  D:  12/23/2003  T:  12/23/2003  Job:  [id number]
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
## 2010-08-15 ENCOUNTER — Emergency Department (HOSPITAL_COMMUNITY): Payer: Medicare Other

## 2010-08-15 ENCOUNTER — Emergency Department (HOSPITAL_COMMUNITY)
Admission: EM | Admit: 2010-08-15 | Discharge: 2010-08-15 | Disposition: A | Discharge status: Home / Self Care | Payer: Medicare Other | Attending: Emergency Medicine | Admitting: Emergency Medicine

## 2010-08-15 DIAGNOSIS — Z8659 Personal history of other mental and behavioral disorders: Secondary | ICD-10-CM | POA: Insufficient documentation

## 2010-08-15 DIAGNOSIS — R112 Nausea with vomiting, unspecified: Secondary | ICD-10-CM | POA: Insufficient documentation

## 2010-08-15 DIAGNOSIS — R1013 Epigastric pain: Secondary | ICD-10-CM | POA: Insufficient documentation

## 2010-08-15 DIAGNOSIS — I10 Essential (primary) hypertension: Secondary | ICD-10-CM | POA: Insufficient documentation

## 2010-08-15 DIAGNOSIS — R079 Chest pain, unspecified: Secondary | ICD-10-CM | POA: Insufficient documentation

## 2010-08-15 DIAGNOSIS — I509 Heart failure, unspecified: Secondary | ICD-10-CM | POA: Insufficient documentation

## 2010-08-15 DIAGNOSIS — Z79899 Other long term (current) drug therapy: Secondary | ICD-10-CM | POA: Insufficient documentation

## 2010-08-15 DIAGNOSIS — I059 Rheumatic mitral valve disease, unspecified: Secondary | ICD-10-CM | POA: Insufficient documentation

## 2010-08-15 LAB — CBC
HCT: 34.3 % — ABNORMAL LOW (ref 36.0–46.0)
Hemoglobin: 11.8 g/dL — ABNORMAL LOW (ref 12.0–15.0)
WBC: 7.8 10*3/uL (ref 4.0–10.5)

## 2010-08-15 LAB — COMPREHENSIVE METABOLIC PANEL
ALT: 16 U/L (ref 0–35)
AST: 18 U/L (ref 0–37)
Albumin: 3.8 g/dL (ref 3.5–5.2)
Alkaline Phosphatase: 75 U/L (ref 39–117)
Potassium: 4.1 mEq/L (ref 3.5–5.1)
Sodium: 136 mEq/L (ref 135–145)
Total Protein: 8 g/dL (ref 6.0–8.3)

## 2010-08-15 LAB — TROPONIN I
Troponin I: <0.3 ng/mL (ref <0.30)
Troponin I: <0.3 ng/mL (ref <0.30)

## 2010-08-15 LAB — CK TOTAL AND CKMB (NOT AT ARMC)
CK, MB: 2.7 ng/mL (ref 0.3–4.0)
CK, MB: 3 ng/mL (ref 0.3–4.0)
Relative Index: 1.1 (ref 0.0–2.5)
Relative Index: 1.2 (ref 0.0–2.5)
Total CK: 248 U/L — ABNORMAL HIGH (ref 7–177)

## 2010-08-15 LAB — DIFFERENTIAL
Basophils Absolute: 0 10*3/uL (ref 0.0–0.1)
Lymphocytes Relative: 22 % (ref 12–46)
Lymphs Abs: 1.7 10*3/uL (ref 0.7–4.0)
Neutro Abs: 5.6 10*3/uL (ref 1.7–7.7)

## 2010-08-15 LAB — LIPASE, BLOOD: Lipase: 27 U/L (ref 11–59)

## 2011-05-05 DIAGNOSIS — I739 Peripheral vascular disease, unspecified: Secondary | ICD-10-CM | POA: Insufficient documentation

## 2011-05-05 HISTORY — DX: Peripheral vascular disease, unspecified: I73.9

## 2011-05-29 HISTORY — PX: OTHER SURGICAL HISTORY: SHX169

## 2012-05-13 ENCOUNTER — Emergency Department (HOSPITAL_COMMUNITY): Payer: No Typology Code available for payment source

## 2012-05-13 ENCOUNTER — Observation Stay (HOSPITAL_COMMUNITY)
Admission: EM | Admit: 2012-05-13 | Discharge: 2012-05-15 | Disposition: A | Discharge status: Home / Self Care | Payer: No Typology Code available for payment source | Attending: General Surgery | Admitting: General Surgery

## 2012-05-13 ENCOUNTER — Encounter (HOSPITAL_COMMUNITY): Payer: Self-pay

## 2012-05-13 DIAGNOSIS — I10 Essential (primary) hypertension: Secondary | ICD-10-CM | POA: Insufficient documentation

## 2012-05-13 DIAGNOSIS — M542 Cervicalgia: Secondary | ICD-10-CM | POA: Insufficient documentation

## 2012-05-13 DIAGNOSIS — R079 Chest pain, unspecified: Principal | ICD-10-CM | POA: Insufficient documentation

## 2012-05-13 DIAGNOSIS — M545 Low back pain, unspecified: Secondary | ICD-10-CM | POA: Insufficient documentation

## 2012-05-13 DIAGNOSIS — R9431 Abnormal electrocardiogram [ECG] [EKG]: Secondary | ICD-10-CM | POA: Insufficient documentation

## 2012-05-13 DIAGNOSIS — S2020XA Contusion of thorax, unspecified, initial encounter: Secondary | ICD-10-CM

## 2012-05-13 LAB — CBC
HCT: 38.4 % (ref 36.0–46.0)
MCV: 93.2 fL (ref 78.0–100.0)
RBC: 4.12 MIL/uL (ref 3.87–5.11)
RDW: 13.3 % (ref 11.5–15.5)
WBC: 6.3 10*3/uL (ref 4.0–10.5)

## 2012-05-13 LAB — BASIC METABOLIC PANEL
BUN: 23 mg/dL (ref 6–23)
CO2: 23 mEq/L (ref 19–32)
Chloride: 105 mEq/L (ref 96–112)
Creatinine, Ser: 1.23 mg/dL — ABNORMAL HIGH (ref 0.50–1.10)
GFR calc Af Amer: 54 mL/min — ABNORMAL LOW (ref >90)
Potassium: 4.2 mEq/L (ref 3.5–5.1)

## 2012-05-13 LAB — POCT I-STAT TROPONIN I: Troponin i, poc: 0 ng/mL (ref 0.00–0.08)

## 2012-05-13 MED ORDER — HYDRALAZINE HCL 20 MG/ML IJ SOLN
10.0000 mg | Freq: Once | INTRAMUSCULAR | Status: AC
  Administered 2012-05-14: 10 mg via INTRAVENOUS
  Filled 2012-05-13 (×2): qty 0.5

## 2012-05-13 MED ORDER — HYDROMORPHONE HCL PF 1 MG/ML IJ SOLN
1.0000 mg | Freq: Once | INTRAMUSCULAR | Status: AC
  Administered 2012-05-13: 1 mg via INTRAVENOUS
  Filled 2012-05-13: qty 1

## 2012-05-13 MED ORDER — IOHEXOL 300 MG/ML  SOLN
100.0000 mL | Freq: Once | INTRAMUSCULAR | Status: AC | PRN
  Administered 2012-05-13: 100 mL via INTRAVENOUS
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
## 2012-05-13 NOTE — ED Notes (Signed)
Patient transported to CT 
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
## 2012-05-14 ENCOUNTER — Encounter (HOSPITAL_COMMUNITY): Payer: Self-pay | Admitting: *Deleted

## 2012-05-14 DIAGNOSIS — S2691XA Contusion of heart, unspecified with or without hemopericardium, initial encounter: Secondary | ICD-10-CM

## 2012-05-14 DIAGNOSIS — I10 Essential (primary) hypertension: Secondary | ICD-10-CM

## 2012-05-14 LAB — BASIC METABOLIC PANEL
Calcium: 9.5 mg/dL (ref 8.4–10.5)
GFR calc Af Amer: 68 mL/min — ABNORMAL LOW (ref >90)
GFR calc non Af Amer: 59 mL/min — ABNORMAL LOW (ref >90)
Glucose, Bld: 119 mg/dL — ABNORMAL HIGH (ref 70–99)
Potassium: 4.1 mEq/L (ref 3.5–5.1)
Sodium: 136 mEq/L (ref 135–145)

## 2012-05-14 LAB — CBC
Hemoglobin: 12.7 g/dL (ref 12.0–15.0)
MCHC: 33.4 g/dL (ref 30.0–36.0)
Platelets: 258 10*3/uL (ref 150–400)

## 2012-05-14 MED ORDER — HYDRALAZINE HCL 20 MG/ML IJ SOLN
20.0000 mg | INTRAMUSCULAR | Status: DC | PRN
  Administered 2012-05-14 – 2012-05-15 (×2): 20 mg via INTRAVENOUS
  Filled 2012-05-14 (×2): qty 1

## 2012-05-14 MED ORDER — PANTOPRAZOLE SODIUM 40 MG PO TBEC
40.0000 mg | DELAYED_RELEASE_TABLET | Freq: Every day | ORAL | Status: DC
  Administered 2012-05-14 – 2012-05-15 (×2): 40 mg via ORAL
  Filled 2012-05-14 (×2): qty 1

## 2012-05-14 MED ORDER — MORPHINE SULFATE 2 MG/ML IJ SOLN: 2.0000 mg | INTRAMUSCULAR | Status: DC | PRN

## 2012-05-14 MED ORDER — DOCUSATE SODIUM 100 MG PO CAPS
100.0000 mg | ORAL_CAPSULE | Freq: Two times a day (BID) | ORAL | Status: DC
  Administered 2012-05-14 – 2012-05-15 (×3): 100 mg via ORAL
  Filled 2012-05-14 (×4): qty 1

## 2012-05-14 MED ORDER — DIGOXIN 125 MCG PO TABS
0.1250 mg | ORAL_TABLET | Freq: Every day | ORAL | Status: DC
  Administered 2012-05-14 – 2012-05-15 (×2): 0.125 mg via ORAL
  Filled 2012-05-14 (×2): qty 1

## 2012-05-14 MED ORDER — ONDANSETRON HCL 4 MG PO TABS
4.0000 mg | ORAL_TABLET | Freq: Four times a day (QID) | ORAL | Status: DC | PRN
  Administered 2012-05-15: 4 mg via ORAL
  Filled 2012-05-14: qty 1

## 2012-05-14 MED ORDER — SIMVASTATIN 20 MG PO TABS
20.0000 mg | ORAL_TABLET | Freq: Every evening | ORAL | Status: DC
  Administered 2012-05-14: 20 mg via ORAL
  Filled 2012-05-14 (×2): qty 1

## 2012-05-14 MED ORDER — CARVEDILOL 25 MG PO TABS
25.0000 mg | ORAL_TABLET | Freq: Two times a day (BID) | ORAL | Status: DC
  Administered 2012-05-14 – 2012-05-15 (×3): 25 mg via ORAL
  Filled 2012-05-14 (×5): qty 1

## 2012-05-14 MED ORDER — SODIUM CHLORIDE 0.9 % IV SOLN
INTRAVENOUS | Status: AC
  Administered 2012-05-14: 04:00:00 via INTRAVENOUS

## 2012-05-14 MED ORDER — HYDRALAZINE HCL 50 MG PO TABS
50.0000 mg | ORAL_TABLET | Freq: Three times a day (TID) | ORAL | Status: DC
  Administered 2012-05-14 – 2012-05-15 (×4): 50 mg via ORAL
  Filled 2012-05-14 (×6): qty 1

## 2012-05-14 MED ORDER — ONDANSETRON HCL 4 MG/2ML IJ SOLN: 4.0000 mg | Freq: Four times a day (QID) | INTRAMUSCULAR | Status: DC | PRN

## 2012-05-14 MED ORDER — ASPIRIN EC 81 MG PO TBEC
81.0000 mg | DELAYED_RELEASE_TABLET | Freq: Every day | ORAL | Status: DC
  Administered 2012-05-14 – 2012-05-15 (×2): 81 mg via ORAL
  Filled 2012-05-14 (×2): qty 1

## 2012-05-14 MED ORDER — SPIRONOLACTONE 12.5 MG HALF TABLET
12.5000 mg | ORAL_TABLET | Freq: Every day | ORAL | Status: DC
  Administered 2012-05-14 – 2012-05-15 (×2): 12.5 mg via ORAL
  Filled 2012-05-14 (×3): qty 1

## 2012-05-14 MED ORDER — HYDROCODONE-ACETAMINOPHEN 5-325 MG PO TABS
2.0000 | ORAL_TABLET | ORAL | Status: DC | PRN
  Administered 2012-05-14 (×2): 2 via ORAL
  Filled 2012-05-14 (×2): qty 2

## 2012-05-14 MED ORDER — PANTOPRAZOLE SODIUM 40 MG IV SOLR: 40.0000 mg | Freq: Every day | INTRAVENOUS | Status: DC

## 2012-05-14 MED ORDER — ACETAMINOPHEN 325 MG PO TABS: 650.0000 mg | ORAL_TABLET | ORAL | Status: DC | PRN

## 2012-05-14 MED ORDER — ENOXAPARIN SODIUM 40 MG/0.4ML ~~LOC~~ SOLN
40.0000 mg | SUBCUTANEOUS | Status: DC
  Administered 2012-05-14: 40 mg via SUBCUTANEOUS
  Filled 2012-05-14 (×2): qty 0.4

## 2012-05-14 MED ORDER — ISOSORBIDE MONONITRATE ER 60 MG PO TB24
90.0000 mg | ORAL_TABLET | Freq: Every day | ORAL | Status: DC
  Administered 2012-05-14 – 2012-05-15 (×2): 90 mg via ORAL
  Filled 2012-05-14 (×2): qty 1

## 2012-05-14 NOTE — ED Notes (Signed)
[facility] called for transport 
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
## 2012-05-14 NOTE — Progress Notes (Signed)
  Echocardiogram 2D Echocardiogram has been performed.  Erin Riley 05/14/2012, 11:18 AM
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
## 2012-05-14 NOTE — Progress Notes (Signed)
Pt admitted to 3W06 from [facility] ED.  Pt in no acute distress, reports pain all over 9/10.  SBP 198 with heart rate 58.  Dr. Lindie Spruce notified of patient arrival.

## 2012-05-14 NOTE — Progress Notes (Signed)
UR completed 

## 2012-05-14 NOTE — Progress Notes (Signed)
Attempted to speak with patient to assess and complete SBIRT but patient refuses to speak with me right now as she is upset and "pissed she doesn't have her phone charger." Patient appears very agitated and "frazzled" at the moment- will f/u with patient later today to attempt SBIRT assessment.  Reece Levy, MSW, LCSWA [phone] For Macario Golds, LCSW, Trauma Team CSW

## 2012-05-15 DIAGNOSIS — I10 Essential (primary) hypertension: Secondary | ICD-10-CM | POA: Insufficient documentation

## 2012-05-15 DIAGNOSIS — S2020XA Contusion of thorax, unspecified, initial encounter: Secondary | ICD-10-CM

## 2012-05-15 MED ORDER — HYDROCODONE-ACETAMINOPHEN 5-325 MG PO TABS: 2.0000 | ORAL_TABLET | ORAL | Status: DC | PRN
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
## 2012-05-15 NOTE — Progress Notes (Signed)
Okay to go home.  This patient has been seen and I agree with the findings and treatment plan.  James O. Wyatt, III, MD, FACS [phone] (pager) [phone] (direct pager) Trauma Surgeon 
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
## 2012-05-15 NOTE — Discharge Summary (Signed)
Patient okay to go home.  This patient has been seen and I agree with the findings and treatment plan.  Marta Lamas. Gae Bon, MD, FACS [phone] (pager) [phone] (direct pager) Trauma Surgeon

## 2012-07-04 HISTORY — PX: TRANSTHORACIC ECHOCARDIOGRAM: SHX275

## 2012-07-31 ENCOUNTER — Inpatient Hospital Stay (HOSPITAL_COMMUNITY)
Admission: EM | Admit: 2012-07-31 | Discharge: 2012-08-02 | DRG: 684 | Disposition: A | Discharge status: Home / Self Care | Payer: No Typology Code available for payment source | Attending: Internal Medicine | Admitting: Internal Medicine

## 2012-07-31 ENCOUNTER — Encounter (HOSPITAL_COMMUNITY): Payer: Self-pay | Admitting: Emergency Medicine

## 2012-07-31 DIAGNOSIS — M549 Dorsalgia, unspecified: Secondary | ICD-10-CM

## 2012-07-31 DIAGNOSIS — I160 Hypertensive urgency: Secondary | ICD-10-CM

## 2012-07-31 DIAGNOSIS — N183 Chronic kidney disease, stage 3 unspecified: Secondary | ICD-10-CM | POA: Diagnosis present

## 2012-07-31 DIAGNOSIS — I10 Essential (primary) hypertension: Secondary | ICD-10-CM

## 2012-07-31 DIAGNOSIS — F209 Schizophrenia, unspecified: Secondary | ICD-10-CM

## 2012-07-31 DIAGNOSIS — I129 Hypertensive chronic kidney disease with stage 1 through stage 4 chronic kidney disease, or unspecified chronic kidney disease: Principal | ICD-10-CM | POA: Diagnosis present

## 2012-07-31 DIAGNOSIS — N1831 Chronic kidney disease, stage 3a: Secondary | ICD-10-CM | POA: Diagnosis present

## 2012-07-31 DIAGNOSIS — Z7982 Long term (current) use of aspirin: Secondary | ICD-10-CM

## 2012-07-31 DIAGNOSIS — I059 Rheumatic mitral valve disease, unspecified: Secondary | ICD-10-CM | POA: Diagnosis present

## 2012-07-31 DIAGNOSIS — M545 Low back pain, unspecified: Secondary | ICD-10-CM | POA: Diagnosis present

## 2012-07-31 DIAGNOSIS — G8929 Other chronic pain: Secondary | ICD-10-CM | POA: Diagnosis present

## 2012-07-31 DIAGNOSIS — F200 Paranoid schizophrenia: Secondary | ICD-10-CM | POA: Diagnosis present

## 2012-07-31 DIAGNOSIS — Z79899 Other long term (current) drug therapy: Secondary | ICD-10-CM

## 2012-07-31 DIAGNOSIS — M79605 Pain in left leg: Secondary | ICD-10-CM | POA: Diagnosis present

## 2012-07-31 DIAGNOSIS — I340 Nonrheumatic mitral (valve) insufficiency: Secondary | ICD-10-CM | POA: Diagnosis present

## 2012-07-31 DIAGNOSIS — R079 Chest pain, unspecified: Secondary | ICD-10-CM

## 2012-07-31 DIAGNOSIS — Z87891 Personal history of nicotine dependence: Secondary | ICD-10-CM

## 2012-07-31 LAB — LIPID PANEL
LDL Cholesterol: 130 mg/dL — ABNORMAL HIGH (ref 0–99)
Triglycerides: 120 mg/dL (ref <150)

## 2012-07-31 LAB — URINALYSIS, ROUTINE W REFLEX MICROSCOPIC
Hgb urine dipstick: NEGATIVE
Ketones, ur: NEGATIVE mg/dL
Protein, ur: 30 mg/dL — AB
Urobilinogen, UA: 0.2 mg/dL (ref 0.0–1.0)

## 2012-07-31 LAB — CBC
HCT: 38.8 % (ref 36.0–46.0)
HCT: 38 % (ref 36.0–46.0)
Hemoglobin: 12.9 g/dL (ref 12.0–15.0)
MCH: 31.1 pg (ref 26.0–34.0)
MCHC: 34.3 g/dL (ref 30.0–36.0)
MCHC: 33.9 g/dL (ref 30.0–36.0)
RDW: 12.9 % (ref 11.5–15.5)
WBC: 5.6 10*3/uL (ref 4.0–10.5)

## 2012-07-31 LAB — BASIC METABOLIC PANEL
BUN: 20 mg/dL (ref 6–23)
CO2: 28 mEq/L (ref 19–32)
Chloride: 101 mEq/L (ref 96–112)
Glucose, Bld: 182 mg/dL — ABNORMAL HIGH (ref 70–99)
Potassium: 3.7 mEq/L (ref 3.5–5.1)

## 2012-07-31 LAB — DIGOXIN LEVEL: Digoxin Level: <0.3 ng/mL — ABNORMAL LOW (ref 0.8–2.0)

## 2012-07-31 LAB — POCT I-STAT, CHEM 8
BUN: 20 mg/dL (ref 6–23)
Calcium, Ion: 1.22 mmol/L (ref 1.13–1.30)
Creatinine, Ser: 1.2 mg/dL — ABNORMAL HIGH (ref 0.50–1.10)
Hemoglobin: 13.9 g/dL (ref 12.0–15.0)
Sodium: 141 mEq/L (ref 135–145)
TCO2: 30 mmol/L (ref 0–100)

## 2012-07-31 LAB — URINE MICROSCOPIC-ADD ON

## 2012-07-31 MED ORDER — HYDRALAZINE HCL 50 MG PO TABS
50.0000 mg | ORAL_TABLET | Freq: Four times a day (QID) | ORAL | Status: DC
  Administered 2012-08-01 (×3): 50 mg via ORAL
  Filled 2012-07-31 (×6): qty 1

## 2012-07-31 MED ORDER — HEPARIN SODIUM (PORCINE) 5000 UNIT/ML IJ SOLN
5000.0000 [IU] | Freq: Three times a day (TID) | INTRAMUSCULAR | Status: DC
  Administered 2012-08-01 – 2012-08-02 (×4): 5000 [IU] via SUBCUTANEOUS
  Filled 2012-07-31 (×7): qty 1

## 2012-07-31 MED ORDER — CLONIDINE HCL 0.2 MG PO TABS: 0.2000 mg | ORAL_TABLET | Freq: Once | ORAL | Status: DC

## 2012-07-31 MED ORDER — ASPIRIN EC 81 MG PO TBEC
81.0000 mg | DELAYED_RELEASE_TABLET | Freq: Every day | ORAL | Status: DC
  Administered 2012-08-01 – 2012-08-02 (×2): 81 mg via ORAL
  Filled 2012-07-31 (×2): qty 1

## 2012-07-31 MED ORDER — SPIRONOLACTONE 12.5 MG HALF TABLET
12.5000 mg | ORAL_TABLET | Freq: Every day | ORAL | Status: DC
  Administered 2012-08-01 – 2012-08-02 (×2): 12.5 mg via ORAL
  Filled 2012-07-31 (×2): qty 1

## 2012-07-31 MED ORDER — SODIUM CHLORIDE 0.9 % IJ SOLN
3.0000 mL | Freq: Two times a day (BID) | INTRAMUSCULAR | Status: DC
  Administered 2012-08-01 – 2012-08-02 (×2): 3 mL via INTRAVENOUS

## 2012-07-31 MED ORDER — CLONIDINE HCL 0.1 MG PO TABS
0.1000 mg | ORAL_TABLET | Freq: Once | ORAL | Status: AC
  Administered 2012-07-31: 0.1 mg via ORAL
  Filled 2012-07-31: qty 1

## 2012-07-31 MED ORDER — CARVEDILOL 25 MG PO TABS
25.0000 mg | ORAL_TABLET | ORAL | Status: AC
  Administered 2012-07-31: 25 mg via ORAL
  Filled 2012-07-31: qty 1

## 2012-07-31 MED ORDER — SIMVASTATIN 20 MG PO TABS
20.0000 mg | ORAL_TABLET | Freq: Every evening | ORAL | Status: DC
  Administered 2012-08-01: 20 mg via ORAL
  Filled 2012-07-31 (×2): qty 1

## 2012-07-31 MED ORDER — SODIUM CHLORIDE 0.9 % IV SOLN: 250.0000 mL | INTRAVENOUS | Status: DC | PRN

## 2012-07-31 MED ORDER — AMLODIPINE BESYLATE 10 MG PO TABS
10.0000 mg | ORAL_TABLET | Freq: Every day | ORAL | Status: DC
  Administered 2012-07-31 – 2012-08-02 (×3): 10 mg via ORAL
  Filled 2012-07-31 (×3): qty 1

## 2012-07-31 MED ORDER — OXYCODONE HCL 5 MG PO TABS
5.0000 mg | ORAL_TABLET | ORAL | Status: DC | PRN
Start: 2012-07-31 — End: 2012-08-02
  Administered 2012-08-02: 5 mg via ORAL
  Filled 2012-07-31: qty 1

## 2012-07-31 MED ORDER — ISOSORBIDE MONONITRATE ER 60 MG PO TB24
90.0000 mg | ORAL_TABLET | Freq: Every day | ORAL | Status: DC
  Administered 2012-08-01 – 2012-08-02 (×2): 90 mg via ORAL
  Filled 2012-07-31 (×2): qty 1

## 2012-07-31 MED ORDER — SODIUM CHLORIDE 0.9 % IJ SOLN: 3.0000 mL | INTRAMUSCULAR | Status: DC | PRN

## 2012-07-31 MED ORDER — HYDRALAZINE HCL 50 MG PO TABS
50.0000 mg | ORAL_TABLET | ORAL | Status: DC
  Administered 2012-07-31: 50 mg via ORAL
  Filled 2012-07-31: qty 1

## 2012-07-31 MED ORDER — OXYCODONE-ACETAMINOPHEN 5-325 MG PO TABS
1.0000 | ORAL_TABLET | ORAL | Status: DC | PRN
Start: 2012-07-31 — End: 2012-08-02
  Administered 2012-08-01 – 2012-08-02 (×6): 1 via ORAL
  Filled 2012-07-31 (×6): qty 1

## 2012-07-31 MED ORDER — HYDROMORPHONE HCL PF 1 MG/ML IJ SOLN
1.0000 mg | Freq: Once | INTRAMUSCULAR | Status: AC
  Administered 2012-07-31: 1 mg via INTRAVENOUS
  Filled 2012-07-31: qty 1

## 2012-07-31 MED ORDER — LABETALOL HCL 5 MG/ML IV SOLN
20.0000 mg | Freq: Once | INTRAVENOUS | Status: AC
  Administered 2012-07-31: 20 mg via INTRAVENOUS
  Filled 2012-07-31: qty 4

## 2012-07-31 MED ORDER — DIGOXIN 125 MCG PO TABS
0.1250 mg | ORAL_TABLET | Freq: Every day | ORAL | Status: DC
  Administered 2012-08-01 – 2012-08-02 (×2): 0.125 mg via ORAL
  Filled 2012-07-31 (×2): qty 1

## 2012-07-31 MED ORDER — AMLODIPINE BESYLATE 10 MG PO TABS: 10.0000 mg | ORAL_TABLET | Freq: Every day | ORAL | Status: DC

## 2012-07-31 NOTE — ED Notes (Signed)
Pippin, MD aware that hydralazine and carvedilol must come from main pharmacy and may take a while to receive.

## 2012-07-31 NOTE — ED Notes (Signed)
Attempted to call report x 1  
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
## 2012-07-31 NOTE — ED Notes (Signed)
Pippin, MD made aware of pt's very high BP.  Stated will see pt.

## 2012-08-01 ENCOUNTER — Encounter (HOSPITAL_COMMUNITY): Payer: Self-pay | Admitting: General Practice

## 2012-08-01 ENCOUNTER — Inpatient Hospital Stay (HOSPITAL_COMMUNITY): Payer: No Typology Code available for payment source

## 2012-08-01 DIAGNOSIS — N1831 Chronic kidney disease, stage 3a: Secondary | ICD-10-CM | POA: Diagnosis present

## 2012-08-01 DIAGNOSIS — F200 Paranoid schizophrenia: Secondary | ICD-10-CM | POA: Diagnosis present

## 2012-08-01 DIAGNOSIS — I517 Cardiomegaly: Secondary | ICD-10-CM

## 2012-08-01 DIAGNOSIS — I340 Nonrheumatic mitral (valve) insufficiency: Secondary | ICD-10-CM | POA: Diagnosis present

## 2012-08-01 DIAGNOSIS — F209 Schizophrenia, unspecified: Secondary | ICD-10-CM

## 2012-08-01 DIAGNOSIS — I10 Essential (primary) hypertension: Secondary | ICD-10-CM

## 2012-08-01 DIAGNOSIS — M545 Low back pain: Secondary | ICD-10-CM

## 2012-08-01 LAB — CBC
HCT: 38.2 % (ref 36.0–46.0)
MCHC: 33.8 g/dL (ref 30.0–36.0)
RDW: 13.1 % (ref 11.5–15.5)

## 2012-08-01 LAB — COMPREHENSIVE METABOLIC PANEL
ALT: 13 U/L (ref 0–35)
Alkaline Phosphatase: 63 U/L (ref 39–117)
CO2: 26 mEq/L (ref 19–32)
GFR calc Af Amer: 58 mL/min — ABNORMAL LOW (ref >90)
GFR calc non Af Amer: 50 mL/min — ABNORMAL LOW (ref >90)
Glucose, Bld: 92 mg/dL (ref 70–99)
Potassium: 4.1 mEq/L (ref 3.5–5.1)
Sodium: 137 mEq/L (ref 135–145)

## 2012-08-01 LAB — TROPONIN I: Troponin I: <0.3 ng/mL (ref <0.30)

## 2012-08-01 LAB — PRO B NATRIURETIC PEPTIDE: Pro B Natriuretic peptide (BNP): 316.9 pg/mL — ABNORMAL HIGH (ref 0–125)

## 2012-08-01 LAB — HEMOGLOBIN A1C: Mean Plasma Glucose: 105 mg/dL (ref <117)

## 2012-08-01 LAB — RAPID URINE DRUG SCREEN, HOSP PERFORMED: Opiates: POSITIVE — AB

## 2012-08-01 MED ORDER — CARVEDILOL 25 MG PO TABS
25.0000 mg | ORAL_TABLET | Freq: Two times a day (BID) | ORAL | Status: DC
  Administered 2012-08-01 – 2012-08-02 (×2): 25 mg via ORAL
  Filled 2012-08-01 (×4): qty 1

## 2012-08-01 MED ORDER — HYDRALAZINE HCL 50 MG PO TABS
50.0000 mg | ORAL_TABLET | Freq: Three times a day (TID) | ORAL | Status: DC
  Administered 2012-08-01 – 2012-08-02 (×2): 50 mg via ORAL
  Filled 2012-08-01 (×5): qty 1

## 2012-08-01 MED ORDER — DOCUSATE SODIUM 100 MG PO CAPS
100.0000 mg | ORAL_CAPSULE | Freq: Two times a day (BID) | ORAL | Status: DC
  Administered 2012-08-01 – 2012-08-02 (×3): 100 mg via ORAL
  Filled 2012-08-01 (×4): qty 1

## 2012-08-01 NOTE — Evaluation (Signed)
Physical Therapy Evaluation Patient Details Name: Erin Riley MRN: [id number] DOB: 19-Nov-1950 Today's Date: 08/01/2012 Time: 6644-0347 PT Time Calculation (min): 25 min  PT Assessment / Plan / Recommendation Clinical Impression  Pt is a 62 y.o. female with increased low back pain and HTN. Patient demonstrates deficits in functional mobility as indicated below. Pt will benefit from skilled PT to address deficitis and maximize independence. Will continue to see as indicated. Rec use of rw and d/c home with assist.    PT Assessment  Patient needs continued PT services    Follow Up Recommendations  No PT follow up;Supervision for mobility/OOB    Does the patient have the potential to tolerate intense rehabilitation      Barriers to Discharge        Equipment Recommendations  Rolling walker with 5" wheels    Recommendations for Other Services OT consult   Frequency Min 3X/week    Precautions / Restrictions Precautions Precautions: Fall   Pertinent Vitals/Pain 10/10 pain; pt premedicated, no physiological signs of distress      Mobility  Bed Mobility Bed Mobility: Supine to Sit;Sitting - Scoot to Edge of Bed Supine to Sit: 5: Supervision Sitting - Scoot to Edge of Bed: 5: Supervision Details for Bed Mobility Assistance: pt somewhat impuslive with movements, VCs for controlled movement and educated on rolling for LBP Transfers Transfers: Sit to Stand;Stand to Sit Sit to Stand: 5: Supervision Stand to Sit: 5: Supervision Details for Transfer Assistance: VCs for hand placement and upright posture Ambulation/Gait Ambulation/Gait Assistance: 4: Min guard Ambulation Distance (Feet): 46 Feet Assistive device: Rolling walker Ambulation/Gait Assistance Details: VCs for upright posture and positioning within rw Gait Pattern: Step-through pattern;Decreased stride length;Antalgic;Trunk flexed;Wide base of support Gait velocity: decreased and guarded General Gait Details: slow gait  with some instability noted, increased pain reported with gait Stairs: No    Exercises Total Joint Exercises Ankle Circles/Pumps: AROM;Both;10 reps Long Arc Quad: AROM;Both;10 reps   PT Diagnosis: Difficulty walking;Acute pain  PT Problem List: Decreased strength;Decreased activity tolerance;Decreased balance;Decreased mobility;Decreased knowledge of use of DME PT Treatment Interventions: DME instruction;Gait training;Functional mobility training;Therapeutic activities;Therapeutic exercise;Patient/family education   PT Goals Acute Rehab PT Goals PT Goal Formulation: With patient Time For Goal Achievement: 08/15/12 Potential to Achieve Goals: Good Pt will go Supine/Side to Sit: with modified independence PT Goal: Supine/Side to Sit - Progress: Goal set today Pt will go Sit to Stand: with modified independence PT Goal: Sit to Stand - Progress: Goal set today Pt will Ambulate: >150 feet;with modified independence PT Goal: Ambulate - Progress: Goal set today  Visit Information  Last PT Received On: 08/01/12 Assistance Needed: +1    Subjective Data  Subjective: I was getting sharp pain in my back I just had to lay down Patient Stated Goal: to go home   Prior Functioning  Home Living Lives With: Son Available Help at Discharge: Family;Available PRN/intermittently Type of Home: Apartment Home Access: Level entry Home Layout: One level Bathroom Shower/Tub: Engineer, manufacturing systems: Standard Home Adaptive Equipment: None Additional Comments: mom has wc that she borrows Prior Function Level of Independence: Independent Able to Take Stairs?: Yes Driving: Yes Communication Communication: No difficulties Dominant Hand: Right    Cognition  Cognition Arousal/Alertness: Awake/alert Behavior During Therapy: WFL for tasks assessed/performed Overall Cognitive Status: Within Functional Limits for tasks assessed    Extremity/Trunk Assessment Right Upper Extremity  Assessment RUE ROM/Strength/Tone: WFL for tasks assessed RUE Sensation: WFL - Light Touch;WFL - Proprioception RUE Coordination:  WFL - gross/fine motor Left Upper Extremity Assessment LUE ROM/Strength/Tone: WFL for tasks assessed LUE Sensation: WFL - Light Touch;WFL - Proprioception LUE Coordination: WFL - gross/fine motor Right Lower Extremity Assessment RLE ROM/Strength/Tone: WFL for tasks assessed RLE Coordination: WFL - gross motor Left Lower Extremity Assessment LLE ROM/Strength/Tone: WFL for tasks assessed LLE Sensation: Deficits LLE Sensation Deficits: "pins and needles sensation" LLE Coordination: WFL - gross motor   Balance Balance Balance Assessed: Yes Dynamic Sitting Balance Dynamic Sitting - Balance Support: Feet supported;During functional activity Dynamic Sitting - Balance Activities: Lateral lean/weight shifting;Forward lean/weight shifting;Trunk control activities Dynamic Sitting - Comments: patient demonstrates good overall sitting balance despite pain Dynamic Standing Balance Dynamic Standing - Balance Support: No upper extremity supported Dynamic Standing - Level of Assistance: 5: Stand by assistance Dynamic Standing - Balance Activities: Reaching for objects;Reaching across midline Dynamic Standing - Comments: patient with increased flexion in standing activities High Level Balance High Level Balance Activites: Side stepping;Backward walking;Direction changes;Turns;Sudden stops;Head turns High Level Balance Comments: some instability noted, limited by pain   End of Session PT - End of Session Equipment Utilized During Treatment: Gait belt Activity Tolerance: Patient limited by pain Patient left: in chair;with call bell/phone within reach Nurse Communication: Mobility status;Other (comment) (provided lumbar support, pt states relief with use)  GP     Fabio Asa 08/01/2012, 2:37 PM Charlotte Crumb, PT DPT  [phone]

## 2012-08-01 NOTE — Progress Notes (Signed)
[facility] PROGRESS NOTE  DEEANNA BEIGHTOL WUJ:[id number] DOB: Feb 20, 1951 DOA: 07/31/2012 PCP: Billee Cashing, MD  Assessment/Plan: Principal Problem:   Hypertensive urgency Active Problems:   Schizophrenia   Low back pain radiating to both legs   CKD (chronic kidney disease) stage 3, GFR 30-59 ml/min   Mitral regurgitation  BP improved. Await echo and PT.  Will MRI LS spine  Code Status: full Family Communication: none today Disposition Plan: home   Consultants:    Procedures:    Antibiotics:    HPI/Subjective: C/o back pain with radiation to legs  Objective: Filed Vitals:   08/01/12 0500 08/01/12 1111 08/01/12 1114 08/01/12 1216  BP:  149/93  139/73  Pulse:   66   Temp:      TempSrc:      Resp:      Weight: 91.3 kg (201 lb 4.5 oz)     SpO2:        Intake/Output Summary (Last 24 hours) at 08/01/12 1308 Last data filed at 08/01/12 1118  Gross per 24 hour  Intake    363 ml  Output      0 ml  Net    363 ml   Filed Weights   08/01/12 0023 08/01/12 0500  Weight: 88.905 kg (196 lb) 91.3 kg (201 lb 4.5 oz)    Exam:   General:  Unkempt.  Red wig on  Cardiovascular: RRR without MGR  Respiratory: CTA without WRR  Abdomen: S, NT, ND  Musculoskeletal: SLR negative.  Yells when i even lightly touch any part of back  Data Reviewed: Basic Metabolic Panel:  Recent Labs Lab 07/31/12 2048 07/31/12 2323 08/01/12 0530  NA 141 138 137  K 3.9 3.7 4.1  CL 105 101 101  CO2  --  28 26  GLUCOSE 100* 182* 92  BUN 20 20 21   CREATININE 1.20* 1.18* 1.16*  CALCIUM  --  9.6 9.5   Liver Function Tests:  Recent Labs Lab 08/01/12 0530  AST 19  ALT 13  ALKPHOS 63  BILITOT 0.5  PROT 7.5  ALBUMIN 3.5   No results found for this basename: LIPASE, AMYLASE,  in the last 168 hours No results found for this basename: AMMONIA,  in the last 168 hours CBC:  Recent Labs Lab 07/31/12 2038 07/31/12 2048 07/31/12 2323 08/01/12 0530  WBC 6.5  --  5.6  6.2  HGB 13.3 13.9 12.9 12.9  HCT 38.8 41.0 38.0 38.2  MCV 90.7  --  91.1 91.2  PLT 273  --  248 287   Cardiac Enzymes:  Recent Labs Lab 07/31/12 2316 08/01/12 0530 08/01/12 1027  TROPONINI <0.30 <0.30 <0.30   BNP (last 3 results)  Recent Labs  07/31/12 2316  PROBNP 316.9*   CBG: No results found for this basename: GLUCAP,  in the last 168 hours  No results found for this or any previous visit (from the past 240 hour(s)).   Studies: No results found.  Scheduled Meds: . amLODipine  10 mg Oral Daily  . aspirin EC  81 mg Oral Daily  . digoxin  0.125 mg Oral Daily  . docusate sodium  100 mg Oral BID  . heparin  5,000 Units Subcutaneous Q8H  . hydrALAZINE  50 mg Oral Q6H  . isosorbide mononitrate  90 mg Oral Daily  . simvastatin  20 mg Oral QPM  . sodium chloride  3 mL Intravenous Q12H  . sodium chloride  3 mL Intravenous Q12H  . spironolactone  12.5 mg Oral Daily   Continuous Infusions:   Time spent: 35 min  Dennice Tindol L  [facility] Pager [phone]. If 7PM-7AM, please contact night-coverage at [email or web address], password [facility] 08/01/2012, 1:08 PM  LOS: 1 day

## 2012-08-01 NOTE — ED Provider Notes (Signed)
I have personally seen and examined the patient.  I have discussed the plan of care with the resident.  I have reviewed the documentation on PMH/FH/Soc. History.  I have reviewed the documentation of the resident and agree.  Doug Sou, MD 08/01/12 0111

## 2012-08-01 NOTE — Care Management Note (Unsigned)
    Page 1 of 1   08/01/2012     4:04:40 PM   CARE MANAGEMENT NOTE 08/01/2012  Patient:  Erin Riley, Erin Riley   Account Number:  [id number]  Date Initiated:  08/01/2012  Documentation initiated by:  Farin Buhman  Subjective/Objective Assessment:   PT ADM WITH CHRONIC BACK PAIN, HYPERTENSION.  PTA, PT RESIDES AT HOME WITH SON.     Action/Plan:   WILL FOLLOW FOR HOME NEEDS AS PT PROGRESSES.   Anticipated DC Date:  08/02/2012   Anticipated DC Plan:  HOME/SELF CARE      DC Planning Services  CM consult      Choice offered to / List presented to:             Status of service:  In process, will continue to follow Medicare Important Message given?   (If response is "NO", the following Medicare IM given date fields will be blank) Date Medicare IM given:   Date Additional Medicare IM given:    Discharge Disposition:    Per UR Regulation:  Reviewed for med. necessity/level of care/duration of stay  If discussed at Long Length of Stay Meetings, dates discussed:    Comments:

## 2012-08-01 NOTE — Progress Notes (Signed)
  Echocardiogram 2D Echocardiogram has been performed.  Mykell Rawl 08/01/2012, 10:00 AM

## 2012-08-02 DIAGNOSIS — M549 Dorsalgia, unspecified: Secondary | ICD-10-CM

## 2012-08-02 LAB — URINE CULTURE: Colony Count: 9000

## 2012-08-02 MED ORDER — ACETAMINOPHEN 325 MG PO TABS
650.0000 mg | ORAL_TABLET | ORAL | Status: DC | PRN
  Administered 2012-08-02: 650 mg via ORAL
  Filled 2012-08-02: qty 2

## 2012-08-02 MED ORDER — PROMETHAZINE HCL 12.5 MG PO TABS: 12.5000 mg | ORAL_TABLET | Freq: Four times a day (QID) | ORAL | Status: DC | PRN

## 2012-08-02 MED ORDER — METHOCARBAMOL 500 MG PO TABS: 500.0000 mg | ORAL_TABLET | Freq: Three times a day (TID) | ORAL | Status: DC | PRN

## 2012-08-02 MED ORDER — ACETAMINOPHEN 325 MG PO TABS: 650.0000 mg | ORAL_TABLET | Freq: Three times a day (TID) | ORAL | Status: DC

## 2012-08-02 MED ORDER — ONDANSETRON HCL 4 MG/2ML IJ SOLN
4.0000 mg | INTRAMUSCULAR | Status: DC | PRN
  Administered 2012-08-02 (×2): 4 mg via INTRAVENOUS
  Filled 2012-08-02 (×2): qty 2

## 2012-08-02 MED ORDER — METHOCARBAMOL 100 MG/ML IJ SOLN
500.0000 mg | Freq: Once | INTRAVENOUS | Status: DC
  Filled 2012-08-02: qty 5

## 2012-08-02 MED ORDER — ISOSORBIDE MONONITRATE ER 60 MG PO TB24: 60.0000 mg | ORAL_TABLET | Freq: Every day | ORAL | Status: DC

## 2012-08-02 MED ORDER — AMLODIPINE BESYLATE 10 MG PO TABS: 10.0000 mg | ORAL_TABLET | Freq: Every day | ORAL | Status: DC

## 2012-08-02 NOTE — Progress Notes (Signed)
Pt d/c home with instructions, rx, and f/u appointments. Pt and dtr verbalized understanding. RW ordered and delivered to room before discharge.  Pt home with family.
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
## 2012-08-02 NOTE — Progress Notes (Signed)
Physical Therapy Treatment Patient Details Name: Erin Riley MRN: [id number] DOB: 1950-05-04 Today's Date: 08/02/2012 Time: 4098-1191 PT Time Calculation (min): 23 min  PT Assessment / Plan / Recommendation Comments on Treatment Session  Patient overall demonstrates improvements in functional mobility at this time. Patient able to increase ambulation but was limited by nausea, Nsg aware.    Follow Up Recommendations  No PT follow up;Supervision for mobility/OOB     Does the patient have the potential to tolerate intense rehabilitation     Barriers to Discharge        Equipment Recommendations  Rolling walker with 5" wheels    Recommendations for Other Services    Frequency Min 3X/week   Plan Discharge plan remains appropriate    Precautions / Restrictions Precautions Precautions: Fall   Pertinent Vitals/Pain 2/10 pain at this time; nauseated at conclusion of session    Mobility  Bed Mobility Bed Mobility: Supine to Sit;Sitting - Scoot to Edge of Bed Supine to Sit: 5: Supervision Sitting - Scoot to Edge of Bed: 5: Supervision Details for Bed Mobility Assistance: pt somewhat impuslive with movements, VCs for controlled movement and educated on rolling for LBP Transfers Transfers: Sit to Stand;Stand to Sit Sit to Stand: 5: Supervision Stand to Sit: 5: Supervision Details for Transfer Assistance: VCs for hand placement and upright posture Ambulation/Gait Ambulation/Gait Assistance: 5: Supervision Ambulation Distance (Feet): 110 Feet Assistive device: Rolling walker Ambulation/Gait Assistance Details: Pt is a 62 year old with T1DM 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  Went to see pt for PT evaluation, upon entering patient was asleep in bed but easily aroused.  Pt initially declined activity with agreement to participate in the afternoon stating she was very tired. Pt did agree to ambulation to tub room for bathing per nsg request.  Patient demonstrates some  instability with ambulation, question if related to lethargy or instability.  Pt left in tub room, phone in hand, and nsg aware. This PT will follow up this afternoon to perform some ther-ex and additional activities to address deconditioning.  Per chart pt is in process of application for [place] [facility].  Will continue to and increase activity in preparation for pt discharge.  Gait Pattern: Step-through pattern;Decreased stride length;Antalgic;Trunk flexed;Wide base of support Gait velocity: decreased and guarded General Gait Details: slow gait with some instability noted, increased pain reported with gait Stairs: No    Exercises Total Joint Exercises Ankle Circles/Pumps: AROM;Both;10 reps Long Arc Quad: AROM;Both;10 reps General Exercises - Lower Extremity Hip Flexion/Marching: AROM;Both;10 reps (increased pain with this exercise but able to perform)   PT Diagnosis:    PT Problem List:   PT Treatment Interventions:     PT Goals Acute Rehab PT Goals PT Goal Formulation: With patient Time For Goal Achievement: 08/15/12 Potential to Achieve Goals: Good Pt will go Supine/Side to Sit: with modified independence PT Goal: Supine/Side to Sit - Progress: Progressing toward goal Pt will go Sit to Stand: with modified independence PT Goal: Sit to Stand - Progress: Progressing toward goal Pt will Ambulate: >150 feet;with modified independence PT Goal: Ambulate - Progress: Progressing toward goal  Visit Information  Last PT Received On: 08/02/12 Assistance Needed: +1    Subjective Data  Subjective: I feel better now but my headache was bad earlier Patient Stated Goal: to go home   Cognition  Cognition Arousal/Alertness: Awake/alert Behavior During Therapy: WFL for tasks assessed/performed Overall Cognitive Status: Within Functional Limits for tasks assessed    Balance  Balance Balance Assessed: Yes Dynamic Sitting Balance Dynamic Sitting - Balance Support: Feet supported;During  functional activity Dynamic Sitting - Balance Activities: Lateral lean/weight shifting;Forward lean/weight shifting;Trunk control activities Dynamic Sitting - Comments: steady with all activities Dynamic Standing Balance Dynamic Standing - Balance Support: No upper extremity supported Dynamic Standing - Level of Assistance: 5: Stand by assistance Dynamic Standing - Balance Activities: Reaching for objects;Reaching across midline Dynamic Standing - Comments: steady High Level Balance High Level Balance Activites: Side stepping;Backward walking;Direction changes;Turns;Sudden stops;Head turns High Level Balance Comments: some instability noted, limited by pain   End of Session PT - End of Session Equipment Utilized During Treatment: Gait belt Activity Tolerance: Patient limited by pain Patient left: in chair;with call bell/phone within reach Nurse Communication: Mobility status;Other (comment) (pt nauseated at end of session)   GP     Fabio Asa 08/02/2012, 2:39 PM

## 2012-08-11 ENCOUNTER — Encounter: Payer: Self-pay | Admitting: *Deleted

## 2012-08-12 ENCOUNTER — Ambulatory Visit: Payer: Medicare Other | Admitting: Cardiology

## 2012-08-12 ENCOUNTER — Encounter: Payer: Self-pay | Admitting: Cardiology

## 2012-10-08 ENCOUNTER — Other Ambulatory Visit: Payer: Self-pay | Admitting: Cardiology

## 2012-10-08 NOTE — Telephone Encounter (Signed)
Rx was sent to pharmacy electronically. 

## 2012-10-09 NOTE — Telephone Encounter (Signed)
Rx was sent to pharmacy electronically. 

## 2012-11-16 ENCOUNTER — Other Ambulatory Visit: Payer: Self-pay | Admitting: Cardiology

## 2012-11-18 NOTE — Telephone Encounter (Signed)
Rx was sent to pharmacy electronically. 

## 2013-02-13 ENCOUNTER — Encounter: Payer: Self-pay | Admitting: Cardiology

## 2013-02-13 ENCOUNTER — Ambulatory Visit (INDEPENDENT_AMBULATORY_CARE_PROVIDER_SITE_OTHER): Payer: Medicare Other | Admitting: Cardiology

## 2013-02-13 VITALS — BP 132/94 | HR 83 | Ht 68.0 in | Wt 225.2 lb

## 2013-02-13 DIAGNOSIS — I119 Hypertensive heart disease without heart failure: Secondary | ICD-10-CM

## 2013-02-13 DIAGNOSIS — I10 Essential (primary) hypertension: Secondary | ICD-10-CM

## 2013-02-13 DIAGNOSIS — I739 Peripheral vascular disease, unspecified: Secondary | ICD-10-CM

## 2013-02-13 DIAGNOSIS — E669 Obesity, unspecified: Secondary | ICD-10-CM

## 2013-02-13 DIAGNOSIS — I519 Heart disease, unspecified: Secondary | ICD-10-CM

## 2013-02-13 DIAGNOSIS — E785 Hyperlipidemia, unspecified: Secondary | ICD-10-CM

## 2013-02-13 DIAGNOSIS — R079 Chest pain, unspecified: Secondary | ICD-10-CM

## 2013-02-13 DIAGNOSIS — I340 Nonrheumatic mitral (valve) insufficiency: Secondary | ICD-10-CM

## 2013-02-13 DIAGNOSIS — I422 Other hypertrophic cardiomyopathy: Secondary | ICD-10-CM

## 2013-02-13 DIAGNOSIS — I059 Rheumatic mitral valve disease, unspecified: Secondary | ICD-10-CM

## 2013-02-13 DIAGNOSIS — I5189 Other ill-defined heart diseases: Secondary | ICD-10-CM

## 2013-02-13 MED ORDER — DIGOXIN 125 MCG PO TABS: ORAL_TABLET | ORAL | Status: DC

## 2013-02-13 MED ORDER — ISOSORBIDE MONONITRATE ER 60 MG PO TB24: 90.0000 mg | ORAL_TABLET | Freq: Every day | ORAL | Status: DC

## 2013-02-13 MED ORDER — ATORVASTATIN CALCIUM 40 MG PO TABS: 40.0000 mg | ORAL_TABLET | Freq: Every day | ORAL | Status: DC

## 2013-02-13 MED ORDER — SPIRONOLACTONE 25 MG PO TABS: ORAL_TABLET | ORAL | Status: DC

## 2013-02-13 MED ORDER — AMLODIPINE BESYLATE 10 MG PO TABS: 10.0000 mg | ORAL_TABLET | Freq: Every day | ORAL | Status: DC

## 2013-02-13 MED ORDER — CARVEDILOL 25 MG PO TABS: 25.0000 mg | ORAL_TABLET | Freq: Two times a day (BID) | ORAL | Status: DC

## 2013-02-13 MED ORDER — HYDRALAZINE HCL 50 MG PO TABS: ORAL_TABLET | ORAL | Status: DC
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
## 2013-02-13 NOTE — Patient Instructions (Signed)
Continue with current medication.   Your physician wants you to follow-up in 6 months Dr Harding.  You will receive a reminder letter in the mail two months in advance. If you don't receive a letter, please call our office to schedule the follow-up appointment.  

## 2013-02-15 ENCOUNTER — Encounter: Payer: Self-pay | Admitting: Cardiology

## 2013-02-15 DIAGNOSIS — I519 Heart disease, unspecified: Secondary | ICD-10-CM | POA: Insufficient documentation

## 2013-02-15 DIAGNOSIS — I422 Other hypertrophic cardiomyopathy: Secondary | ICD-10-CM | POA: Insufficient documentation

## 2013-02-15 DIAGNOSIS — E785 Hyperlipidemia, unspecified: Secondary | ICD-10-CM | POA: Insufficient documentation

## 2013-02-15 DIAGNOSIS — E669 Obesity, unspecified: Secondary | ICD-10-CM | POA: Insufficient documentation

## 2013-02-15 DIAGNOSIS — I119 Hypertensive heart disease without heart failure: Secondary | ICD-10-CM

## 2013-02-15 HISTORY — DX: Hypertensive heart disease without heart failure: I11.9

## 2013-02-15 NOTE — Assessment & Plan Note (Signed)
Not quite at goal for her by her last labs in May. Unfortunately does not on the simvastatin. There is troubled with the simvastatin plus amlodipine, therefore were noted just stop simvastatin altogether and restart her back on atorvastatin at 40 mg daily. We can recheck her labs her to see her back in followup.

## 2013-02-15 NOTE — Assessment & Plan Note (Signed)
For blood pressure control is likely important, she is on a good regimen. She also cannot tolerate dehydration with her significant LVOT gradient. Is a fine line between and diastolic heart failure which would occur in the setting of accelerated hypertension/hypertensive urgency. Thankfully she is only minimally symptomatic.  Plan: Stay at rehydrated, continue current measures for now. Consider stopping digoxin. Consider changing from hydralazine/by cerumen nitrate to ACE inhibitor versus ARB.

## 2013-02-15 NOTE — Assessment & Plan Note (Signed)
Thankfully she is not having any heart failure symptoms. She is on a good regimen as noted above.

## 2013-02-15 NOTE — Assessment & Plan Note (Signed)
Her blood pressure is great today, her diastolic pressure is a little high, but systolic pressure was good. She is on 50 twice a day hydralazine plus nitrate as well as carvedilol, amlodipine and spironolactone. She is not on an ACE inhibitor, mostly because of her history of CKD.  I think I see her back in 6 months we may be considered potentially trying an ARB or ACE inhibitor in lieu of the hydralazine- Imdur combination.

## 2013-02-15 NOTE — Assessment & Plan Note (Signed)
She does state he knows intuition with problems as far as claudication. She would probably be due for lower extremity arterial Dopplers at her next followup.

## 2013-02-15 NOTE — Assessment & Plan Note (Signed)
No cyanotic regurgitation at her last 2 echoes. It can take this off as a resolved diagnosis.
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
## 2013-03-17 ENCOUNTER — Telehealth: Payer: Self-pay | Admitting: Cardiology

## 2013-03-17 NOTE — Telephone Encounter (Signed)
Returned call and pt verified x 2.  Pt informed message received and advised she contact PCP for information.  Pt stated Dr. Ronne BinningMcKenzie didn't have one and Dr. Herbie BaltimoreHarding told her to go to "the place across the hall from y'all."  Pt informed that is [facility].  Pt needs number.  RN google searched the number and gave to pt.  Pt verbalized understanding and agreed w/ plan.

## 2013-03-17 NOTE — Telephone Encounter (Signed)
Wants to get name of a back specialist.  Was in hospital 2 days and was told to go to back specialist.  Please call

## 2013-04-10 ENCOUNTER — Telehealth: Payer: Self-pay | Admitting: *Deleted

## 2013-04-10 NOTE — Telephone Encounter (Signed)
Pt stated she was told by Dr. Herbie BaltimoreHarding that he was sending her over to [facility] for her back. She stated that they need a referral and they have not received one and she is in A LOT of pain and needs this done.  DH

## 2013-04-10 NOTE — Telephone Encounter (Signed)
Returned call and pt verified x 2.  Pt informed message received and advised she contact her PCP for a referral to [facility] for her back pain as Dr. Herbie BaltimoreHarding is her heart doctor and would not f/u on any findings for her back.  Pt stated Dr. Herbie BaltimoreHarding told her to go to [facility] at her last visit and that he would send a referral to them.  Pt informed there is no documentation of that in her note.  Pt stated Dr. Ronne BinningMcKenzie has her on meds for her back and Dr. Herbie BaltimoreHarding is aware of it b/c she brought all of her meds to her appt.  Pt informed that is true, but she is being treated by Dr. Ronne BinningMcKenzie for her back and her referral should come from him. Pt stated she wants to talk to Dr. Herbie BaltimoreHarding directly and informed he is out of the office and his nurse, Jasmine DecemberSharon, will be notified.  Pt verbalized understanding and agreed w/ plan.  Message forwarded to Jasmine DecemberSharon, [place].

## 2013-04-14 ENCOUNTER — Telehealth (HOSPITAL_COMMUNITY): Payer: Self-pay | Admitting: *Deleted

## 2013-04-25 NOTE — Telephone Encounter (Signed)
Spoke to patient.  RN apologies for not getting back to her sooner,per Dr Herbie BaltimoreHarding -he would like for her PCP to refer her to ortho.of his choice. Patient verbalized understanding.

## 2013-05-15 ENCOUNTER — Telehealth (HOSPITAL_COMMUNITY): Payer: Self-pay | Admitting: *Deleted

## 2013-07-16 ENCOUNTER — Encounter (HOSPITAL_COMMUNITY): Payer: Self-pay | Admitting: Emergency Medicine

## 2013-07-16 ENCOUNTER — Emergency Department (HOSPITAL_COMMUNITY)
Admission: EM | Admit: 2013-07-16 | Discharge: 2013-07-16 | Disposition: A | Discharge status: Home / Self Care | Payer: Medicare Other | Attending: Emergency Medicine | Admitting: Emergency Medicine

## 2013-07-16 ENCOUNTER — Emergency Department (HOSPITAL_COMMUNITY): Payer: Medicare Other

## 2013-07-16 DIAGNOSIS — Z8659 Personal history of other mental and behavioral disorders: Secondary | ICD-10-CM | POA: Insufficient documentation

## 2013-07-16 DIAGNOSIS — G8921 Chronic pain due to trauma: Secondary | ICD-10-CM | POA: Insufficient documentation

## 2013-07-16 DIAGNOSIS — M25511 Pain in right shoulder: Secondary | ICD-10-CM

## 2013-07-16 DIAGNOSIS — Z9889 Other specified postprocedural states: Secondary | ICD-10-CM | POA: Insufficient documentation

## 2013-07-16 DIAGNOSIS — I519 Heart disease, unspecified: Secondary | ICD-10-CM | POA: Insufficient documentation

## 2013-07-16 DIAGNOSIS — Z7982 Long term (current) use of aspirin: Secondary | ICD-10-CM | POA: Insufficient documentation

## 2013-07-16 DIAGNOSIS — E785 Hyperlipidemia, unspecified: Secondary | ICD-10-CM | POA: Insufficient documentation

## 2013-07-16 DIAGNOSIS — M25519 Pain in unspecified shoulder: Secondary | ICD-10-CM | POA: Insufficient documentation

## 2013-07-16 DIAGNOSIS — Z79899 Other long term (current) drug therapy: Secondary | ICD-10-CM | POA: Insufficient documentation

## 2013-07-16 DIAGNOSIS — R011 Cardiac murmur, unspecified: Secondary | ICD-10-CM | POA: Insufficient documentation

## 2013-07-16 DIAGNOSIS — I129 Hypertensive chronic kidney disease with stage 1 through stage 4 chronic kidney disease, or unspecified chronic kidney disease: Secondary | ICD-10-CM | POA: Insufficient documentation

## 2013-07-16 DIAGNOSIS — N182 Chronic kidney disease, stage 2 (mild): Secondary | ICD-10-CM | POA: Insufficient documentation

## 2013-07-16 DIAGNOSIS — I2510 Atherosclerotic heart disease of native coronary artery without angina pectoris: Secondary | ICD-10-CM | POA: Insufficient documentation

## 2013-07-16 DIAGNOSIS — Z87891 Personal history of nicotine dependence: Secondary | ICD-10-CM | POA: Insufficient documentation

## 2013-07-16 MED ORDER — OXYCODONE-ACETAMINOPHEN 5-325 MG PO TABS: 1.0000 | ORAL_TABLET | Freq: Four times a day (QID) | ORAL | Status: DC | PRN

## 2013-07-16 MED ORDER — OXYCODONE-ACETAMINOPHEN 5-325 MG PO TABS: 1.0000 | ORAL_TABLET | Freq: Once | ORAL | Status: DC

## 2013-07-16 MED ORDER — OXYCODONE-ACETAMINOPHEN 5-325 MG PO TABS
1.0000 | ORAL_TABLET | Freq: Once | ORAL | Status: AC
  Administered 2013-07-16: 1 via ORAL
  Filled 2013-07-16: qty 1

## 2013-07-16 MED ORDER — ONDANSETRON 4 MG PO TBDP
4.0000 mg | ORAL_TABLET | Freq: Once | ORAL | Status: AC
  Administered 2013-07-16: 4 mg via ORAL
  Filled 2013-07-16: qty 1

## 2013-07-16 NOTE — ED Notes (Signed)
PA at bedside.
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
## 2013-07-16 NOTE — ED Notes (Signed)
No answer when called in waiting room. 
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
## 2013-07-16 NOTE — Discharge Instructions (Signed)
Arthralgia  Your caregiver has diagnosed you as suffering from an arthralgia. Arthralgia means there is pain in a joint. This can come from many reasons including:  · Bruising the joint which causes soreness (inflammation) in the joint.  · Wear and tear on the joints which occur as we grow older (osteoarthritis).  · Overusing the joint.  · Various forms of arthritis.  · Infections of the joint.  Regardless of the cause of pain in your joint, most of these different pains respond to anti-inflammatory drugs and rest. The exception to this is when a joint is infected, and these cases are treated with antibiotics, if it is a bacterial infection.  HOME CARE INSTRUCTIONS   · Rest the injured area for as long as directed by your caregiver. Then slowly start using the joint as directed by your caregiver and as the pain allows. Crutches as directed may be useful if the ankles, knees or hips are involved. If the knee was splinted or casted, continue use and care as directed. If an stretchy or elastic wrapping bandage has been applied today, it should be removed and re-applied every 3 to 4 hours. It should not be applied tightly, but firmly enough to keep swelling down. Watch toes and feet for swelling, bluish discoloration, coldness, numbness or excessive pain. If any of these problems (symptoms) occur, remove the ace bandage and re-apply more loosely. If these symptoms persist, contact your caregiver or return to this location.  · For the first 24 hours, keep the injured extremity elevated on pillows while lying down.  · Apply ice for 15-20 minutes to the sore joint every couple hours while awake for the first half day. Then 03-04 times per day for the first 48 hours. Put the ice in a plastic bag and place a towel between the bag of ice and your skin.  · Wear any splinting, casting, elastic bandage applications, or slings as instructed.  · Only take over-the-counter or prescription medicines for pain, discomfort, or fever as  directed by your caregiver. Do not use aspirin immediately after the injury unless instructed by your physician. Aspirin can cause increased bleeding and bruising of the tissues.  · If you were given crutches, continue to use them as instructed and do not resume weight bearing on the sore joint until instructed.  Persistent pain and inability to use the sore joint as directed for more than 2 to 3 days are warning signs indicating that you should see a caregiver for a follow-up visit as soon as possible. Initially, a hairline fracture (break in bone) may not be evident on X-rays. Persistent pain and swelling indicate that further evaluation, non-weight bearing or use of the joint (use of crutches or slings as instructed), or further X-rays are indicated. X-rays may sometimes not show a small fracture until a week or 10 days later. Make a follow-up appointment with your own caregiver or one to whom we have referred you. A radiologist (specialist in reading X-rays) may read your X-rays. Make sure you know how you are to obtain your X-ray results. Do not assume everything is normal if you do not hear from us.  SEEK MEDICAL CARE IF:  Bruising, swelling, or pain increases.  SEEK IMMEDIATE MEDICAL CARE IF:   · Your fingers or toes are numb or blue.  · The pain is not responding to medications and continues to stay the same or get worse.  · The pain in your joint becomes severe.  · You   develop a fever over 102° F (38.9° C).  · It becomes impossible to move or use the joint.  MAKE SURE YOU:   · Understand these instructions.  · Will watch your condition.  · Will get help right away if you are not doing well or get worse.  Document Released: 02/20/2005 Document Revised: 05/15/2011 Document Reviewed: 10/09/2007  [employer]® Patient Information ©2014 [employer].

## 2013-07-21 NOTE — ED Provider Notes (Signed)
Medical screening examination/treatment/procedure(s) were performed by non-physician practitioner and as supervising physician I was immediately available for consultation/collaboration.  Zanden Colver T Kaelan Emami, MD 07/21/13 0732 

## 2013-09-25 ENCOUNTER — Encounter (HOSPITAL_COMMUNITY): Payer: Self-pay | Admitting: Emergency Medicine

## 2013-09-25 ENCOUNTER — Emergency Department (HOSPITAL_COMMUNITY)
Admission: EM | Admit: 2013-09-25 | Discharge: 2013-09-25 | Disposition: A | Discharge status: Home / Self Care | Payer: Medicare Other | Attending: Emergency Medicine | Admitting: Emergency Medicine

## 2013-09-25 ENCOUNTER — Emergency Department (HOSPITAL_COMMUNITY): Payer: Medicare Other

## 2013-09-25 DIAGNOSIS — E785 Hyperlipidemia, unspecified: Secondary | ICD-10-CM | POA: Insufficient documentation

## 2013-09-25 DIAGNOSIS — N182 Chronic kidney disease, stage 2 (mild): Secondary | ICD-10-CM | POA: Diagnosis not present

## 2013-09-25 DIAGNOSIS — Z8659 Personal history of other mental and behavioral disorders: Secondary | ICD-10-CM | POA: Diagnosis not present

## 2013-09-25 DIAGNOSIS — Z7982 Long term (current) use of aspirin: Secondary | ICD-10-CM | POA: Diagnosis not present

## 2013-09-25 DIAGNOSIS — I5030 Unspecified diastolic (congestive) heart failure: Secondary | ICD-10-CM | POA: Insufficient documentation

## 2013-09-25 DIAGNOSIS — R079 Chest pain, unspecified: Secondary | ICD-10-CM | POA: Insufficient documentation

## 2013-09-25 DIAGNOSIS — I129 Hypertensive chronic kidney disease with stage 1 through stage 4 chronic kidney disease, or unspecified chronic kidney disease: Secondary | ICD-10-CM | POA: Insufficient documentation

## 2013-09-25 DIAGNOSIS — R011 Cardiac murmur, unspecified: Secondary | ICD-10-CM | POA: Insufficient documentation

## 2013-09-25 DIAGNOSIS — R110 Nausea: Secondary | ICD-10-CM | POA: Insufficient documentation

## 2013-09-25 DIAGNOSIS — I2510 Atherosclerotic heart disease of native coronary artery without angina pectoris: Secondary | ICD-10-CM | POA: Diagnosis not present

## 2013-09-25 DIAGNOSIS — K2100 Gastro-esophageal reflux disease with esophagitis, without bleeding: Secondary | ICD-10-CM | POA: Insufficient documentation

## 2013-09-25 DIAGNOSIS — Z79899 Other long term (current) drug therapy: Secondary | ICD-10-CM | POA: Diagnosis not present

## 2013-09-25 DIAGNOSIS — Z87891 Personal history of nicotine dependence: Secondary | ICD-10-CM | POA: Insufficient documentation

## 2013-09-25 DIAGNOSIS — G8929 Other chronic pain: Secondary | ICD-10-CM | POA: Insufficient documentation

## 2013-09-25 DIAGNOSIS — I422 Other hypertrophic cardiomyopathy: Secondary | ICD-10-CM | POA: Insufficient documentation

## 2013-09-25 LAB — COMPREHENSIVE METABOLIC PANEL
ALBUMIN: 4 g/dL (ref 3.5–5.2)
ALK PHOS: 83 U/L (ref 39–117)
ALT: 14 U/L (ref 0–35)
AST: 17 U/L (ref 0–37)
Anion gap: 12 (ref 5–15)
BILIRUBIN TOTAL: 0.3 mg/dL (ref 0.3–1.2)
BUN: 17 mg/dL (ref 6–23)
CHLORIDE: 102 meq/L (ref 96–112)
CO2: 24 meq/L (ref 19–32)
Calcium: 9.4 mg/dL (ref 8.4–10.5)
Creatinine, Ser: 1.28 mg/dL — ABNORMAL HIGH (ref 0.50–1.10)
GFR calc Af Amer: 51 mL/min — ABNORMAL LOW (ref >90)
GFR, EST NON AFRICAN AMERICAN: 44 mL/min — AB (ref >90)
Glucose, Bld: 90 mg/dL (ref 70–99)
POTASSIUM: 4.4 meq/L (ref 3.7–5.3)
SODIUM: 138 meq/L (ref 137–147)
Total Protein: 8.2 g/dL (ref 6.0–8.3)

## 2013-09-25 LAB — CBC WITH DIFFERENTIAL/PLATELET
BASOS ABS: 0 10*3/uL (ref 0.0–0.1)
BASOS PCT: 0 % (ref 0–1)
Eosinophils Absolute: 0.1 10*3/uL (ref 0.0–0.7)
Eosinophils Relative: 2 % (ref 0–5)
HEMATOCRIT: 38.2 % (ref 36.0–46.0)
Hemoglobin: 12.8 g/dL (ref 12.0–15.0)
Lymphocytes Relative: 39 % (ref 12–46)
Lymphs Abs: 2.5 10*3/uL (ref 0.7–4.0)
MCH: 31.1 pg (ref 26.0–34.0)
MCHC: 33.5 g/dL (ref 30.0–36.0)
MCV: 92.9 fL (ref 78.0–100.0)
Monocytes Absolute: 0.5 10*3/uL (ref 0.1–1.0)
Monocytes Relative: 8 % (ref 3–12)
NEUTROS ABS: 3.3 10*3/uL (ref 1.7–7.7)
NEUTROS PCT: 51 % (ref 43–77)
Platelets: 344 10*3/uL (ref 150–400)
RBC: 4.11 MIL/uL (ref 3.87–5.11)
RDW: 13 % (ref 11.5–15.5)
WBC: 6.5 10*3/uL (ref 4.0–10.5)

## 2013-09-25 LAB — I-STAT TROPONIN, ED: Troponin i, poc: 0 ng/mL (ref 0.00–0.08)

## 2013-09-25 LAB — PRO B NATRIURETIC PEPTIDE: Pro B Natriuretic peptide (BNP): 173.3 pg/mL — ABNORMAL HIGH (ref 0–125)

## 2013-09-25 MED ORDER — ONDANSETRON 4 MG PO TBDP: 4.0000 mg | ORAL_TABLET | Freq: Three times a day (TID) | ORAL | Status: DC | PRN

## 2013-09-25 MED ORDER — GI COCKTAIL ~~LOC~~
30.0000 mL | Freq: Once | ORAL | Status: AC
  Administered 2013-09-25: 30 mL via ORAL
  Filled 2013-09-25: qty 30

## 2013-09-25 MED ORDER — SUCRALFATE 1 G PO TABS: 1.0000 g | ORAL_TABLET | Freq: Four times a day (QID) | ORAL | Status: DC

## 2013-09-25 MED ORDER — PANTOPRAZOLE SODIUM 20 MG PO TBEC: 20.0000 mg | DELAYED_RELEASE_TABLET | Freq: Every day | ORAL | Status: DC

## 2013-09-25 MED ORDER — MORPHINE SULFATE 10 MG/ML IJ SOLN
10.0000 mg | Freq: Once | INTRAMUSCULAR | Status: AC
  Administered 2013-09-25: 10 mg via INTRAMUSCULAR
  Filled 2013-09-25: qty 1

## 2013-09-25 MED ORDER — ONDANSETRON 4 MG PO TBDP
4.0000 mg | ORAL_TABLET | Freq: Once | ORAL | Status: AC
  Administered 2013-09-25: 4 mg via ORAL
  Filled 2013-09-25: qty 1

## 2013-09-25 NOTE — ED Notes (Signed)
Pt states that her chest is hurting and burning.  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  Pt has been treated for reflux but meds are not working.  Pt states pain is radiating up into neck area
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
## 2013-09-25 NOTE — Discharge Instructions (Signed)
Esophagitis °Esophagitis is inflammation of the esophagus. It can involve swelling, soreness, and pain in the esophagus. This condition can make it difficult and painful to swallow. °CAUSES  °Most causes of esophagitis are not serious. Many different factors can cause esophagitis, including: °· Gastroesophageal reflux disease (GERD). This is when acid from your stomach flows up into the esophagus. °· Recurrent vomiting. °· An allergic-type reaction. °· Certain medicines, especially those that come in large pills. °· Ingestion of harmful chemicals, such as household cleaning products. °· Heavy alcohol use. °· An infection of the esophagus. °· Radiation treatment for cancer. °· Certain diseases such as sarcoidosis, Crohn's disease, and scleroderma. These diseases may cause recurrent esophagitis. °SYMPTOMS  °· Trouble swallowing. °· Painful swallowing. °· Chest pain. °· Difficulty breathing. °· Nausea. °· Vomiting. °· Abdominal pain. °DIAGNOSIS  °Your caregiver will take your history and do a physical exam. Depending upon what your caregiver finds, certain tests may also be done, including: °· Barium X-ray. You will drink a solution that coats the esophagus, and X-rays will be taken. °· Endoscopy. A lighted tube is put down the esophagus so your caregiver can examine the area. °· Allergy tests. These can sometimes be arranged through follow-up visits. °TREATMENT  °Treatment will depend on the cause of your esophagitis. In some cases, steroids or other medicines may be given to help relieve your symptoms or to treat the underlying cause of your condition. Medicines that may be recommended include: °· Viscous lidocaine, to soothe the esophagus. °· Antacids. °· Acid reducers. °· Proton pump inhibitors. °· Antiviral medicines for certain viral infections of the esophagus. °· Antifungal medicines for certain fungal infections of the esophagus. °· Antibiotic medicines, depending on the cause of the esophagitis. °HOME CARE  INSTRUCTIONS  °· Avoid foods and drinks that seem to make your symptoms worse. °· Eat small, frequent meals instead of large meals. °· Avoid eating for the 3 hours prior to your bedtime. °· If you have trouble taking pills, use a pill splitter to decrease the size and likelihood of the pill getting stuck or injuring the esophagus on the way down. Drinking water after taking a pill also helps. °· Stop smoking if you smoke. °· Maintain a healthy weight. °· Wear loose-fitting clothing. Do not wear anything tight around your waist that causes pressure on your stomach. °· Raise the head of your bed 6 to 8 inches with wood blocks to help you sleep. Extra pillows will not help. °· Only take over-the-counter or prescription medicines as directed by your caregiver. °SEEK IMMEDIATE MEDICAL CARE IF: °· You have severe chest pain that radiates into your arm, neck, or jaw. °· You feel sweaty, dizzy, or lightheaded. °· You have shortness of breath. °· You vomit blood. °· You have difficulty or pain with swallowing. °· You have bloody or black, tarry stools. °· You have a fever. °· You have a burning sensation in the chest more than 3 times a week for more than 2 weeks. °· You cannot swallow, drink, or eat. °· You drool because you cannot swallow your saliva. °MAKE SURE YOU: °· Understand these instructions. °· Will watch your condition. °· Will get help right away if you are not doing well or get worse. °Document Released: 03/30/2004 Document Revised: 05/15/2011 Document Reviewed: 10/21/2010 °ExitCare® Patient Information ©2015 [employer]. This information is not intended to replace advice given to you by your health care provider. Make sure you discuss any questions you have with your health care provider. ° °  Gastroesophageal Reflux Disease, Adult °Gastroesophageal reflux disease (GERD) happens when acid from your stomach flows up into the esophagus. When acid comes in contact with the esophagus, the acid causes soreness  (inflammation) in the esophagus. Over time, GERD may create small holes (ulcers) in the lining of the esophagus. °CAUSES  °· Increased body weight. This puts pressure on the stomach, making acid rise from the stomach into the esophagus. °· Smoking. This increases acid production in the stomach. °· Drinking alcohol. This causes decreased pressure in the lower esophageal sphincter (valve or ring of muscle between the esophagus and stomach), allowing acid from the stomach into the esophagus. °· Late evening meals and a full stomach. This increases pressure and acid production in the stomach. °· A malformed lower esophageal sphincter. °Sometimes, no cause is found. °SYMPTOMS  °· Burning pain in the lower part of the mid-chest behind the breastbone and in the mid-stomach area. This may occur twice a week or more often. °· Trouble swallowing. °· Sore throat. °· Dry cough. °· Asthma-like symptoms including chest tightness, shortness of breath, or wheezing. °DIAGNOSIS  °Your caregiver may be able to diagnose GERD based on your symptoms. In some cases, X-rays and other tests may be done to check for complications or to check the condition of your stomach and esophagus. °TREATMENT  °Your caregiver may recommend over-the-counter or prescription medicines to help decrease acid production. Ask your caregiver before starting or adding any new medicines.  °HOME CARE INSTRUCTIONS  °· Change the factors that you can control. Ask your caregiver for guidance concerning weight loss, quitting smoking, and alcohol consumption. °· Avoid foods and drinks that make your symptoms worse, such as: °¨ Caffeine or alcoholic drinks. °¨ Chocolate. °¨ Peppermint or mint flavorings. °¨ Garlic and onions. °¨ Spicy foods. °¨ Citrus fruits, such as oranges, lemons, or limes. °¨ Tomato-based foods such as sauce, chili, salsa, and pizza. °¨ Fried and fatty foods. °· Avoid lying down for the 3 hours prior to your bedtime or prior to taking a nap. °· Eat  small, frequent meals instead of large meals. °· Wear loose-fitting clothing. Do not wear anything tight around your waist that causes pressure on your stomach. °· Raise the head of your bed 6 to 8 inches with wood blocks to help you sleep. Extra pillows will not help. °· Only take over-the-counter or prescription medicines for pain, discomfort, or fever as directed by your caregiver. °· Do not take aspirin, ibuprofen, or other nonsteroidal anti-inflammatory drugs (NSAIDs). °SEEK IMMEDIATE MEDICAL CARE IF:  °· You have pain in your arms, neck, jaw, teeth, or back. °· Your pain increases or changes in intensity or duration. °· You develop nausea, vomiting, or sweating (diaphoresis). °· You develop shortness of breath, or you faint. °· Your vomit is green, yellow, black, or looks like coffee grounds or blood. °· Your stool is red, bloody, or black. °These symptoms could be signs of other problems, such as heart disease, gastric bleeding, or esophageal bleeding. °MAKE SURE YOU:  °· Understand these instructions. °· Will watch your condition. °· Will get help right away if you are not doing well or get worse. °Document Released: 11/30/2004 Document Revised: 05/15/2011 Document Reviewed: 09/09/2010 °ExitCare® Patient Information ©2015 [employer]. This information is not intended to replace advice given to you by your health care provider. Make sure you discuss any questions you have with your health care provider. ° °

## 2013-09-30 ENCOUNTER — Telehealth: Payer: Self-pay | Admitting: Cardiology

## 2013-10-02 ENCOUNTER — Encounter: Payer: Self-pay | Admitting: Internal Medicine

## 2013-10-07 NOTE — Telephone Encounter (Signed)
Closed enounter °

## 2013-11-17 ENCOUNTER — Telehealth: Payer: Self-pay | Admitting: Cardiology

## 2013-11-17 NOTE — Telephone Encounter (Signed)
Pt called in stating that she is having bad muscle spasms on her behind and legs and would like to be seen sooner that 10/1. Please call  Thanks

## 2013-11-17 NOTE — Telephone Encounter (Signed)
Agree with this recommendation.  If no better after 1 month - it is not statin induced.  Marykay Lex, MD

## 2013-11-17 NOTE — Telephone Encounter (Signed)
Returned a call a call to patient. She informs me that she is having "cramps in her legs" pain is so bad that she can hardly stand it. Recommended to patient to take a statin holiday from her atorvastatin 40 mg until her appointment on October the 1st. Report the progress to Dr. Herbie Baltimore at that appointment. I will notify Dr. Herbie Baltimore of the problems that she is having if he wants her to do anything differently I will call her back. Message sent to Dr. Herbie Baltimore for review.

## 2013-12-03 ENCOUNTER — Encounter (HOSPITAL_COMMUNITY): Payer: Self-pay | Admitting: Emergency Medicine

## 2013-12-03 ENCOUNTER — Emergency Department (HOSPITAL_COMMUNITY)
Admission: EM | Admit: 2013-12-03 | Discharge: 2013-12-03 | Disposition: A | Discharge status: Home / Self Care | Payer: Medicare Other | Attending: Emergency Medicine | Admitting: Emergency Medicine

## 2013-12-03 DIAGNOSIS — I1310 Hypertensive heart and chronic kidney disease without heart failure, with stage 1 through stage 4 chronic kidney disease, or unspecified chronic kidney disease: Secondary | ICD-10-CM | POA: Insufficient documentation

## 2013-12-03 DIAGNOSIS — G8929 Other chronic pain: Secondary | ICD-10-CM | POA: Diagnosis not present

## 2013-12-03 DIAGNOSIS — N182 Chronic kidney disease, stage 2 (mild): Secondary | ICD-10-CM | POA: Insufficient documentation

## 2013-12-03 DIAGNOSIS — Z7982 Long term (current) use of aspirin: Secondary | ICD-10-CM | POA: Diagnosis not present

## 2013-12-03 DIAGNOSIS — R011 Cardiac murmur, unspecified: Secondary | ICD-10-CM | POA: Insufficient documentation

## 2013-12-03 DIAGNOSIS — I10 Essential (primary) hypertension: Secondary | ICD-10-CM | POA: Diagnosis not present

## 2013-12-03 DIAGNOSIS — M549 Dorsalgia, unspecified: Secondary | ICD-10-CM

## 2013-12-03 DIAGNOSIS — M545 Low back pain, unspecified: Secondary | ICD-10-CM | POA: Insufficient documentation

## 2013-12-03 DIAGNOSIS — Z9889 Other specified postprocedural states: Secondary | ICD-10-CM | POA: Insufficient documentation

## 2013-12-03 DIAGNOSIS — I739 Peripheral vascular disease, unspecified: Secondary | ICD-10-CM | POA: Insufficient documentation

## 2013-12-03 DIAGNOSIS — Z8659 Personal history of other mental and behavioral disorders: Secondary | ICD-10-CM | POA: Diagnosis not present

## 2013-12-03 DIAGNOSIS — E785 Hyperlipidemia, unspecified: Secondary | ICD-10-CM | POA: Insufficient documentation

## 2013-12-03 DIAGNOSIS — Z87891 Personal history of nicotine dependence: Secondary | ICD-10-CM | POA: Diagnosis not present

## 2013-12-03 DIAGNOSIS — Z79899 Other long term (current) drug therapy: Secondary | ICD-10-CM | POA: Diagnosis not present

## 2013-12-03 DIAGNOSIS — I2510 Atherosclerotic heart disease of native coronary artery without angina pectoris: Secondary | ICD-10-CM | POA: Diagnosis not present

## 2013-12-03 MED ORDER — CYCLOBENZAPRINE HCL 10 MG PO TABS: 10.0000 mg | ORAL_TABLET | Freq: Two times a day (BID) | ORAL | Status: DC | PRN

## 2013-12-03 MED ORDER — HYDROCODONE-ACETAMINOPHEN 5-325 MG PO TABS
2.0000 | ORAL_TABLET | Freq: Once | ORAL | Status: AC
  Administered 2013-12-03: 2 via ORAL
  Filled 2013-12-03: qty 2

## 2013-12-03 MED ORDER — IBUPROFEN 600 MG PO TABS: 600.0000 mg | ORAL_TABLET | Freq: Four times a day (QID) | ORAL | Status: DC | PRN

## 2013-12-03 NOTE — Discharge Instructions (Signed)
Back Pain, Adult Low back pain is very common. About 1 in 5 people have back pain.The cause of low back pain is rarely dangerous. The pain often gets better over time.About half of people with a sudden onset of back pain feel better in just 2 weeks. About 8 in 10 people feel better by 6 weeks.  CAUSES Some common causes of back pain include:  Strain of the muscles or ligaments supporting the spine.  Wear and tear (degeneration) of the spinal discs.  Arthritis.  Direct injury to the back. DIAGNOSIS Most of the time, the direct cause of low back pain is not known.However, back pain can be treated effectively even when the exact cause of the pain is unknown.Answering your caregiver's questions about your overall health and symptoms is one of the most accurate ways to make sure the cause of your pain is not dangerous. If your caregiver needs more information, he or she may order lab work or imaging tests (X-rays or MRIs).However, even if imaging tests show changes in your back, this usually does not require surgery. HOME CARE INSTRUCTIONS For many people, back pain returns.Since low back pain is rarely dangerous, it is often a condition that people can learn to manageon their own.   Remain active. It is stressful on the back to sit or stand in one place. Do not sit, drive, or stand in one place for more than 30 minutes at a time. Take short walks on level surfaces as soon as pain allows.Try to increase the length of time you walk each day.  Do not stay in bed.Resting more than 1 or 2 days can delay your recovery.  Do not avoid exercise or work.Your body is made to move.It is not dangerous to be active, even though your back may hurt.Your back will likely heal faster if you return to being active before your pain is gone.  Pay attention to your body when you bend and lift. Many people have less discomfortwhen lifting if they bend their knees, keep the load close to their bodies,and  avoid twisting. Often, the most comfortable positions are those that put less stress on your recovering back.  Find a comfortable position to sleep. Use a firm mattress and lie on your side with your knees slightly bent. If you lie on your back, put a pillow under your knees.  Only take over-the-counter or prescription medicines as directed by your caregiver. Over-the-counter medicines to reduce pain and inflammation are often the most helpful.Your caregiver may prescribe muscle relaxant drugs.These medicines help dull your pain so you can more quickly return to your normal activities and healthy exercise.  Put ice on the injured area.  Put ice in a plastic bag.  Place a towel between your skin and the bag.  Leave the ice on for 15-20 minutes, 03-04 times a day for the first 2 to 3 days. After that, ice and heat may be alternated to reduce pain and spasms.  Ask your caregiver about trying back exercises and gentle massage. This may be of some benefit.  Avoid feeling anxious or stressed.Stress increases muscle tension and can worsen back pain.It is important to recognize when you are anxious or stressed and learn ways to manage it.Exercise is a great option. SEEK MEDICAL CARE IF:  You have pain that is not relieved with rest or medicine.  You have pain that does not improve in 1 week.  You have new symptoms.  You are generally not feeling well. SEEK   IMMEDIATE MEDICAL CARE IF:   You have pain that radiates from your back into your legs.  You develop new bowel or bladder control problems.  You have unusual weakness or numbness in your arms or legs.  You develop nausea or vomiting.  You develop abdominal pain.  You feel faint. Document Released: 02/20/2005 Document Revised: 08/22/2011 Document Reviewed: 06/24/2013 Exit[employer] Patient Information 2015 [employer]. This information is not intended to replace advice given to you by your health care provider. Make sure you  discuss any questions you have with your health care provider.  
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
## 2013-12-03 NOTE — ED Provider Notes (Signed)
Medical screening examination/treatment/procedure(s) were performed by non-physician practitioner and as supervising physician I was immediately available for consultation/collaboration.   EKG Interpretation None        Curties Conigliaro H Shealee Yordy, MD 12/03/13 1955 

## 2013-12-03 NOTE — ED Notes (Signed)
Patient states went to ortho a while back "and they didn't do nothing.  They said nothing is wrong".   Patient has primary md appointment tomorrow.
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
## 2013-12-04 ENCOUNTER — Ambulatory Visit: Payer: Medicare Other | Admitting: Cardiology

## 2013-12-04 ENCOUNTER — Encounter: Payer: Self-pay | Admitting: Cardiology

## 2013-12-04 ENCOUNTER — Ambulatory Visit (INDEPENDENT_AMBULATORY_CARE_PROVIDER_SITE_OTHER): Payer: Medicare Other | Admitting: Cardiology

## 2013-12-04 VITALS — BP 156/110 | HR 68 | Ht 68.0 in | Wt 224.9 lb

## 2013-12-04 DIAGNOSIS — I422 Other hypertrophic cardiomyopathy: Secondary | ICD-10-CM

## 2013-12-04 DIAGNOSIS — I10 Essential (primary) hypertension: Secondary | ICD-10-CM

## 2013-12-04 DIAGNOSIS — Z79899 Other long term (current) drug therapy: Secondary | ICD-10-CM

## 2013-12-04 DIAGNOSIS — E785 Hyperlipidemia, unspecified: Secondary | ICD-10-CM

## 2013-12-04 DIAGNOSIS — I739 Peripheral vascular disease, unspecified: Secondary | ICD-10-CM

## 2013-12-04 DIAGNOSIS — E669 Obesity, unspecified: Secondary | ICD-10-CM

## 2013-12-04 DIAGNOSIS — I119 Hypertensive heart disease without heart failure: Secondary | ICD-10-CM

## 2013-12-04 NOTE — Patient Instructions (Addendum)
Your physician recommends that you schedule a follow-up appointment in BLOOD P RESSURE CHECK WITH NURSE IN 1-2 WEEKS   LABS FASTING CMP,FASTING LIPIDS   TAKE CO-Q10  300 MG THREE TIMES A DAY MAY GET OVER THE COUNTER.    Your physician wants you to follow-up in 6 MONTH DR [facility]ARD[facility]. You will receive a reminder letter in the mail two months in advance. If you don't receive a letter, please call our office to schedule the follow-up appointment.

## 2013-12-06 ENCOUNTER — Encounter: Payer: Self-pay | Admitting: Cardiology
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
## 2013-12-06 NOTE — Assessment & Plan Note (Signed)
No active heart failure symptoms. She does have a very thick myocardium. Blood pressure is high today which is above normal for her. Is more probably more related to her back pain because she is in significant pain still.  I will like her to come back in a couple weeks for blood pressure check just to confirm that she is usually better controlled.
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
## 2013-12-06 NOTE — Assessment & Plan Note (Signed)
Most of what she describes to be consistent with claudication is buttock claudication. She is on routine followups for her lower extremity Dopplers, not currently to the extent of either symptoms or Doppler stenoses to suggest invasive evaluation..[name]

## 2013-12-06 NOTE — Assessment & Plan Note (Signed)
See above. Blood pressure not quite at goal today. She is on hydralazine nitrate (not on ARB or ACE inhibitor due to history of renal insufficiency).  On max dose of carvedilol and amlodipine along with spironolactone.

## 2013-12-06 NOTE — Assessment & Plan Note (Addendum)
Last check was in May of 2014. Is old. She is now not on atorvastatin. I would like to recheck labs and determine if we need to restart a statin. I would not restart atorvastatin. She was not at goal with 40 of Lipitor, so going to a less potent statin would not be helpful either. Best option would be Crestor 40 mg, but for her insurance services wouldn't start with pravastatin 40 mg daily Start Coenzyme Q-10 & increase to 300 mg daily.

## 2013-12-08 ENCOUNTER — Ambulatory Visit: Payer: Medicare Other | Admitting: Internal Medicine

## 2013-12-12 ENCOUNTER — Ambulatory Visit (INDEPENDENT_AMBULATORY_CARE_PROVIDER_SITE_OTHER): Payer: Medicare Other | Admitting: Gastroenterology

## 2013-12-12 ENCOUNTER — Encounter: Payer: Self-pay | Admitting: Gastroenterology

## 2013-12-12 VITALS — BP 130/70 | HR 68 | Ht 67.0 in | Wt 224.4 lb

## 2013-12-12 DIAGNOSIS — K219 Gastro-esophageal reflux disease without esophagitis: Secondary | ICD-10-CM

## 2013-12-12 DIAGNOSIS — R112 Nausea with vomiting, unspecified: Secondary | ICD-10-CM
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
## 2013-12-12 NOTE — Patient Instructions (Addendum)
Please take prilosec twice a day (one pill 20-30 min before BF and dinner meals). You will be set up for an upper endoscopy for pyrosis, dysphagia.   01/28/2014 AT 12PM

## 2013-12-31 ENCOUNTER — Emergency Department (HOSPITAL_COMMUNITY)
Admission: EM | Admit: 2013-12-31 | Discharge: 2014-01-01 | Disposition: A | Discharge status: Home / Self Care | Payer: Medicare Other | Attending: Emergency Medicine | Admitting: Emergency Medicine

## 2013-12-31 ENCOUNTER — Encounter (HOSPITAL_COMMUNITY): Payer: Self-pay | Admitting: Emergency Medicine

## 2013-12-31 ENCOUNTER — Emergency Department (HOSPITAL_COMMUNITY): Payer: Medicare Other

## 2013-12-31 DIAGNOSIS — Z79899 Other long term (current) drug therapy: Secondary | ICD-10-CM | POA: Insufficient documentation

## 2013-12-31 DIAGNOSIS — Z7982 Long term (current) use of aspirin: Secondary | ICD-10-CM | POA: Diagnosis not present

## 2013-12-31 DIAGNOSIS — G8929 Other chronic pain: Secondary | ICD-10-CM | POA: Diagnosis not present

## 2013-12-31 DIAGNOSIS — Z9889 Other specified postprocedural states: Secondary | ICD-10-CM | POA: Diagnosis not present

## 2013-12-31 DIAGNOSIS — N182 Chronic kidney disease, stage 2 (mild): Secondary | ICD-10-CM | POA: Insufficient documentation

## 2013-12-31 DIAGNOSIS — E785 Hyperlipidemia, unspecified: Secondary | ICD-10-CM | POA: Diagnosis not present

## 2013-12-31 DIAGNOSIS — I2510 Atherosclerotic heart disease of native coronary artery without angina pectoris: Secondary | ICD-10-CM | POA: Diagnosis not present

## 2013-12-31 DIAGNOSIS — Z87891 Personal history of nicotine dependence: Secondary | ICD-10-CM | POA: Insufficient documentation

## 2013-12-31 DIAGNOSIS — Z8659 Personal history of other mental and behavioral disorders: Secondary | ICD-10-CM | POA: Insufficient documentation

## 2013-12-31 DIAGNOSIS — M79642 Pain in left hand: Secondary | ICD-10-CM | POA: Diagnosis not present

## 2013-12-31 DIAGNOSIS — R011 Cardiac murmur, unspecified: Secondary | ICD-10-CM | POA: Diagnosis not present

## 2013-12-31 DIAGNOSIS — I129 Hypertensive chronic kidney disease with stage 1 through stage 4 chronic kidney disease, or unspecified chronic kidney disease: Secondary | ICD-10-CM | POA: Insufficient documentation

## 2013-12-31 MED ORDER — ACETAMINOPHEN 325 MG PO TABS
650.0000 mg | ORAL_TABLET | Freq: Once | ORAL | Status: AC
  Administered 2014-01-01: 650 mg via ORAL
  Filled 2013-12-31: qty 2

## 2013-12-31 NOTE — Discharge Instructions (Signed)
Take Tylenol as needed for pain.
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
## 2014-01-01 NOTE — ED Provider Notes (Signed)
Medical screening examination/treatment/procedure(s) were performed by non-physician practitioner and as supervising physician I was immediately available for consultation/collaboration.   EKG Interpretation None        Paytan Recine, MD 01/01/14 0035 

## 2014-01-28 ENCOUNTER — Ambulatory Visit (AMBULATORY_SURGERY_CENTER): Payer: Medicare Other | Admitting: Gastroenterology

## 2014-01-28 ENCOUNTER — Encounter: Payer: Self-pay | Admitting: Gastroenterology

## 2014-01-28 VITALS — BP 121/61 | HR 67 | Temp 95.6°F | Resp 15 | Ht 67.0 in | Wt 224.0 lb

## 2014-01-28 DIAGNOSIS — K2970 Gastritis, unspecified, without bleeding: Secondary | ICD-10-CM

## 2014-01-28 DIAGNOSIS — K219 Gastro-esophageal reflux disease without esophagitis: Secondary | ICD-10-CM

## 2014-01-28 DIAGNOSIS — K2950 Unspecified chronic gastritis without bleeding: Secondary | ICD-10-CM

## 2014-01-28 DIAGNOSIS — K2990 Gastroduodenitis, unspecified, without bleeding: Secondary | ICD-10-CM

## 2014-01-28 MED ORDER — SODIUM CHLORIDE 0.9 % IV SOLN: 500.0000 mL | INTRAVENOUS | Status: DC

## 2014-01-28 NOTE — Patient Instructions (Signed)
YOU HAD AN ENDOSCOPIC PROCEDURE TODAY AT THE [facility]: Refer to the procedure report that was given to you for any specific questions about what was found during the examination.  If the procedure report does not answer your questions, please call your gastroenterologist to clarify.  If you requested that your care partner not be given the details of your procedure findings, then the procedure report has been included in a sealed envelope for you to review at your convenience later.  YOU SHOULD EXPECT: Some feelings of bloating in the abdomen. Passage of more gas than usual.  Walking can help get rid of the air that was put into your GI tract during the procedure and reduce the bloating. If you had a lower endoscopy (such as a colonoscopy or flexible sigmoidoscopy) you may notice spotting of blood in your stool or on the toilet paper. If you underwent a bowel prep for your procedure, then you may not have a normal bowel movement for a few days.  DIET: Your first meal following the procedure should be a light meal and then it is ok to progress to your normal diet.  A half-sandwich or bowl of soup is an example of a good first meal.  Heavy or fried foods are harder to digest and may make you feel nauseous or bloated.  Likewise meals heavy in dairy and vegetables can cause extra gas to form and this can also increase the bloating.  Drink plenty of fluids but you should avoid alcoholic beverages for 24 hours.  ACTIVITY: Your care partner should take you home directly after the procedure.  You should plan to take it easy, moving slowly for the rest of the day.  You can resume normal activity the day after the procedure however you should NOT DRIVE or use heavy machinery for 24 hours (because of the sedation medicines used during the test).    SYMPTOMS TO REPORT IMMEDIATELY: A gastroenterologist can be reached at any hour.  During normal business hours, 8:30 AM to 5:00 PM Monday through Friday,  call [phone].  After hours and on weekends, please call the GI answering service at [phone] who will take a message and have the physician on call contact you.    Following upper endoscopy (EGD)  Vomiting of blood or coffee ground material  New chest pain or pain under the shoulder blades  Painful or persistently difficult swallowing  New shortness of breath  Fever of 100F or higher  Black, tarry-looking stools  FOLLOW UP: If any biopsies were taken you will be contacted by phone or by letter within the next 1-3 weeks.  Call your gastroenterologist if you have not heard about the biopsies in 3 weeks.  Our staff will call the home number listed on your records the next business day following your procedure to check on you and address any questions or concerns that you may have at that time regarding the information given to you following your procedure. This is a courtesy call and so if there is no answer at the home number and we have not heard from you through the emergency physician on call, we will assume that you have returned to your regular daily activities without incident.  SIGNATURES/CONFIDENTIALITY: You and/or your care partner have signed paperwork which will be entered into your electronic medical record.  These signatures attest to the fact that that the information above on your After Visit Summary has been reviewed and is understood.  Full   responsibility of the confidentiality of this discharge information lies with you and/or your care-partner.   Resume medications. Information given on gastritis with discharge instructions. 

## 2014-01-28 NOTE — Progress Notes (Signed)
Called to room to assist during endoscopic procedure.  Patient ID and intended procedure confirmed with present staff. Received instructions for my participation in the procedure from the performing physician.  

## 2014-01-28 NOTE — Progress Notes (Signed)
Report to PACU, RN, vss, BBS= Clear.  

## 2014-01-28 NOTE — Op Note (Signed)
[facility] [address] [place], [id number]   ENDOSCOPY PROCEDURE REPORT  PATIENT: Erin Riley, Erin Riley  MR#: [id number] BIRTHDATE: 1950-05-10 , 63  yrs. old GENDER: female ENDOSCOPIST: Rachael Feeaniel P Ahriyah Vannest, MD REFERRED BY:  Billee CashingWayland McKenzie, M.D. PROCEDURE DATE:  01/28/2014 PROCEDURE:  EGD w/ biopsy ASA CLASS:     Class II INDICATIONS:  GERD, dyspepsia. MEDICATIONS: Monitored anesthesia care, Propofol 150 mg IV, and lidocaine 100mg  IV TOPICAL ANESTHETIC: none  DESCRIPTION OF PROCEDURE: After the risks benefits and alternatives of the procedure were thoroughly explained, informed consent was obtained.  The LB [id number] endoscope was introduced through the mouth and advanced to the second portion of the duodenum , Without limitations.  The instrument was slowly withdrawn as the mucosa was fully examined.  There was mild to moderate, non-specific distal gastritis.  This was biopsied and sent to pathology.  The examination was otherwise normal.  Retroflexed views revealed no abnormalities.     The scope was then withdrawn from the patient and the procedure completed.  COMPLICATIONS: There were no immediate complications.  ENDOSCOPIC IMPRESSION: There was mild to moderate, non-specific distal gastritis.  This was biopsied and sent to pathology.  The examination was otherwise normal  RECOMMENDATIONS: If biopsies show Riley.  pylori, you will be started on appropriate antibiotics.  eSigned:  Rachael Feeaniel P Yi Haugan, MD 01/28/2014 11:42 AM

## 2014-02-02 ENCOUNTER — Telehealth: Payer: Self-pay | Admitting: *Deleted

## 2014-02-02 NOTE — Telephone Encounter (Signed)
  Follow up Call-  Call back number 01/28/2014  Post procedure Call Back phone  # [phone]  Permission to leave phone message Yes     Patient questions:  Do you have a fever, pain , or abdominal swelling? No. Pain Score  0 *  Have you tolerated food without any problems? Yes.    Have you been able to return to your normal activities? Yes.    Do you have any questions about your discharge instructions: Diet   No. Medications  No. Follow up visit  No.  Do you have questions or concerns about your Care? No.  Actions: * If pain score is 4 or above: No action needed, pain <4.

## 2014-02-03 ENCOUNTER — Encounter: Payer: Self-pay | Admitting: Gastroenterology

## 2014-03-13 ENCOUNTER — Telehealth: Payer: Self-pay | Admitting: Cardiology

## 2014-03-13 MED ORDER — CARVEDILOL 25 MG PO TABS: 25.0000 mg | ORAL_TABLET | Freq: Two times a day (BID) | ORAL | Status: DC

## 2014-03-13 MED ORDER — HYDRALAZINE HCL 50 MG PO TABS: ORAL_TABLET | ORAL | Status: DC

## 2014-03-13 NOTE — Telephone Encounter (Signed)
Pt called in stating that the following medications [facility] [address] Hydralazine Digoxin Carvedilol Amlodipine Spironolactone Atorvastatin Isosorbide  Thanks

## 2014-03-13 NOTE — Telephone Encounter (Signed)
Refills submitted.  

## 2014-06-08 ENCOUNTER — Other Ambulatory Visit: Payer: Self-pay

## 2014-06-08 MED ORDER — ATORVASTATIN CALCIUM 40 MG PO TABS: 40.0000 mg | ORAL_TABLET | Freq: Every day | ORAL | Status: DC

## 2014-06-08 NOTE — Telephone Encounter (Signed)
Rx(s) sent to pharmacy electronically.  

## 2014-06-11 ENCOUNTER — Telehealth: Payer: Self-pay | Admitting: Cardiology

## 2014-06-12 NOTE — Telephone Encounter (Signed)
Close encounter 

## 2014-06-15 ENCOUNTER — Ambulatory Visit (INDEPENDENT_AMBULATORY_CARE_PROVIDER_SITE_OTHER): Payer: Medicare Other | Admitting: Cardiology

## 2014-06-15 ENCOUNTER — Encounter: Payer: Self-pay | Admitting: Cardiology

## 2014-06-15 VITALS — BP 110/68 | HR 73 | Ht 67.5 in | Wt 232.0 lb

## 2014-06-15 DIAGNOSIS — Z79899 Other long term (current) drug therapy: Secondary | ICD-10-CM

## 2014-06-15 DIAGNOSIS — I119 Hypertensive heart disease without heart failure: Secondary | ICD-10-CM | POA: Diagnosis not present

## 2014-06-15 DIAGNOSIS — I519 Heart disease, unspecified: Secondary | ICD-10-CM

## 2014-06-15 DIAGNOSIS — I340 Nonrheumatic mitral (valve) insufficiency: Secondary | ICD-10-CM

## 2014-06-15 DIAGNOSIS — I739 Peripheral vascular disease, unspecified: Secondary | ICD-10-CM

## 2014-06-15 DIAGNOSIS — E785 Hyperlipidemia, unspecified: Secondary | ICD-10-CM

## 2014-06-15 DIAGNOSIS — I10 Essential (primary) hypertension: Secondary | ICD-10-CM | POA: Diagnosis not present

## 2014-06-15 DIAGNOSIS — I5189 Other ill-defined heart diseases: Secondary | ICD-10-CM

## 2014-06-15 DIAGNOSIS — I422 Other hypertrophic cardiomyopathy: Secondary | ICD-10-CM

## 2014-06-15 LAB — COMPREHENSIVE METABOLIC PANEL
ALT: 21 U/L (ref 0–35)
AST: 20 U/L (ref 0–37)
Albumin: 3.7 g/dL (ref 3.5–5.2)
Alkaline Phosphatase: 64 U/L (ref 39–117)
BUN: 17 mg/dL (ref 6–23)
CALCIUM: 9 mg/dL (ref 8.4–10.5)
CHLORIDE: 105 meq/L (ref 96–112)
CO2: 26 meq/L (ref 19–32)
CREATININE: 1.21 mg/dL — AB (ref 0.50–1.10)
Glucose, Bld: 101 mg/dL — ABNORMAL HIGH (ref 70–99)
Potassium: 4.6 mEq/L (ref 3.5–5.3)
SODIUM: 138 meq/L (ref 135–145)
Total Bilirubin: 0.5 mg/dL (ref 0.2–1.2)
Total Protein: 6.9 g/dL (ref 6.0–8.3)

## 2014-06-15 LAB — LIPID PANEL
CHOLESTEROL: 134 mg/dL (ref 0–200)
HDL: 51 mg/dL (ref >=46)
LDL CALC: 67 mg/dL (ref 0–99)
Total CHOL/HDL Ratio: 2.6 Ratio
Triglycerides: 82 mg/dL (ref <150)
VLDL: 16 mg/dL (ref 0–40)

## 2014-06-15 NOTE — Patient Instructions (Addendum)
PLEASE DO LABS -CMP LPIDS  CONTINUE WITH CURRENT MEDICATIONS  Your physician wants you to follow-up in 6 MONTH DR HARDING   30 MIN APPOINTMENT.  You will receive a reminder letter in the mail two months in advance. If you don't receive a letter, please call our office to schedule the follow-up appointment. Cardiac Diet This diet can help prevent heart disease and stroke. Many factors influence your heart health, including eating and exercise habits. Coronary risk rises a lot with abnormal blood fat (lipid) levels. Cardiac meal planning includes limiting unhealthy fats, increasing healthy fats, and making other small dietary changes. General guidelines are as follows:  Adjust calorie intake to reach and maintain desirable body weight.  Limit total fat intake to less than 30% of total calories. Saturated fat should be less than 7% of calories.  Saturated fats are found in animal products and in some vegetable products. Saturated vegetable fats are found in coconut oil, cocoa butter, palm oil, and palm kernel oil. Read labels carefully to avoid these products as much as possible. Use butter in moderation. Choose tub margarines and oils that have 2 grams of fat or less. Good cooking oils are canola and olive oils.  Practice low-fat cooking techniques. Do not fry food. Instead, broil, bake, boil, steam, grill, roast on a rack, stir-fry, or microwave it. Other fat reducing suggestions include:  Remove the skin from poultry.  Remove all visible fat from meats.  Skim the fat off stews, soups, and gravies before serving them.  Steam vegetables in water or broth instead of sauting them in fat.  Avoid foods with trans fat (or hydrogenated oils), such as commercially fried foods and commercially baked goods. Commercial shortening and deep-frying fats will contain trans fat.  Increase intake of fruits, vegetables, whole grains, and legumes to replace foods high in fat.  Increase consumption of nuts,  legumes, and seeds to at least 4 servings weekly. One serving of a legume equals  cup, and 1 serving of nuts or seeds equals  cup.  Choose whole grains more often. Have 3 servings per day (a serving is 1 ounce [oz]).  Eat 4 to 5 servings of vegetables per day. A serving of vegetables is 1 cup of raw leafy vegetables;  cup of raw or cooked cut-up vegetables;  cup of vegetable juice.  Eat 4 to 5 servings of fruit per day. A serving of fruit is 1 medium whole fruit;  cup of dried fruit;  cup of fresh, frozen, or canned fruit;  cup of 100% fruit juice.  Increase your intake of dietary fiber to 20 to 30 grams per day. Insoluble fiber may help lower your risk of heart disease and may help curb your appetite.  Soluble fiber binds cholesterol to be removed from the blood. Foods high in soluble fiber are dried beans, citrus fruits, oats, apples, bananas, broccoli, Brussels sprouts, and eggplant.  Try to include foods fortified with plant sterols or stanols, such as yogurt, breads, juices, or margarines. Choose several fortified foods to achieve a daily intake of 2 to 3 grams of plant sterols or stanols.  Foods with omega-3 fats can help reduce your risk of heart disease. Aim to have a 3.5 oz portion of fatty fish twice per week, such as salmon, mackerel, albacore tuna, sardines, lake trout, or herring. If you wish to take a fish oil supplement, choose one that contains 1 gram of both DHA and EPA.  Limit processed meats to 2 servings (3 oz  portion) weekly.  Limit the sodium in your diet to 1500 milligrams (mg) per day. If you have high blood pressure, talk to a registered dietitian about a DASH (Dietary Approaches to Stop Hypertension) eating plan.  Limit sweets and beverages with added sugar, such as soda, to no more than 5 servings per week. One serving is:   1 tablespoon sugar.  1 tablespoon jelly or jam.   cup sorbet.  1 cup lemonade.   cup regular soda. CHOOSING  FOODS Starches  Allowed: Breads: All kinds (wheat, rye, raisin, white, oatmeal, [place], [place], and English muffin bread). Low-fat rolls: English muffins, frankfurter and hamburger buns, bagels, pita bread, tortillas (not fried). Pancakes, waffles, biscuits, and muffins made with recommended oil.  Avoid: Products made with saturated or trans fats, oils, or whole milk products. Butter rolls, cheese breads, croissants. Commercial doughnuts, muffins, sweet rolls, biscuits, waffles, pancakes, store-bought mixes. Crackers  Allowed: Low-fat crackers and snacks: Animal, graham, rye, saltine (with recommended oil, no lard), oyster, and matzo crackers. Bread sticks, melba toast, rusks, flatbread, pretzels, and light popcorn.  Avoid: High-fat crackers: cheese crackers, butter crackers, and those made with coconut, palm oil, or trans fat (hydrogenated oils). Buttered popcorn. Cereals  Allowed: Hot or cold whole-grain cereals.  Avoid: Cereals containing coconut, hydrogenated vegetable fat, or animal fat. Potatoes / Pasta / Rice  Allowed: All kinds of potatoes, rice, and pasta (such as macaroni, spaghetti, and noodles).  Avoid: Pasta or rice prepared with cream sauce or high-fat cheese. Chow mein noodles, [place] fries. Vegetables  Allowed: All vegetables and vegetable juices.  Avoid: Fried vegetables. Vegetables in cream, butter, or high-fat cheese sauces. Limit coconut. Fruit in cream or custard. Protein  Allowed: Limit your intake of meat, seafood, and poultry to no more than 6 oz (cooked weight) per day. All lean, well-trimmed beef, veal, pork, and lamb. All chicken and [place] without skin. All fish and shellfish. Wild game: wild duck, rabbit, pheasant, and venison. Egg whites or low-cholesterol egg substitutes may be used as desired. Meatless dishes: recipes with dried beans, peas, lentils, and tofu (soybean curd). Seeds and nuts: all seeds and most nuts.  Avoid: Prime grade and other heavily  marbled and fatty meats, such as short ribs, spare ribs, rib eye roast or steak, frankfurters, sausage, bacon, and high-fat luncheon meats, mutton. Caviar. Commercially fried fish. Domestic duck, goose, venison sausage. Organ meats: liver, gizzard, heart, chitterlings, brains, kidney, sweetbreads. Dairy  Allowed: Low-fat cheeses: nonfat or low-fat cottage cheese (1% or 2% fat), cheeses made with part skim milk, such as mozzarella, farmers, string, or ricotta. (Cheeses should be labeled no more than 2 to 6 grams fat per oz.). Skim (or 1%) milk: liquid, powdered, or evaporated. Buttermilk made with low-fat milk. Drinks made with skim or low-fat milk or cocoa. Chocolate milk or cocoa made with skim or low-fat (1%) milk. Nonfat or low-fat yogurt.  Avoid: Whole milk cheeses, including colby, cheddar, [place], [address], [place], [place], [place], [address], [place], and blue. Creamed cottage cheese, cream cheese. Whole milk and whole milk products, including buttermilk or yogurt made from whole milk, drinks made from whole milk. Condensed milk, evaporated whole milk, and 2% milk. Soups and Combination Foods  Allowed: Low-fat low-sodium soups: broth, dehydrated soups, homemade broth, soups with the fat removed, homemade cream soups made with skim or low-fat milk. Low-fat spaghetti, lasagna, chili, and Spanish rice if low-fat ingredients and low-fat cooking techniques are used.  Avoid: Cream soups made with whole milk, cream, or high-fat cheese.  All other soups. Desserts and Sweets  Allowed: Sherbet, fruit ices, gelatins, meringues, and angel food cake. Homemade desserts with recommended fats, oils, and milk products. Jam, jelly, honey, marmalade, sugars, and syrups. Pure sugar candy, such as gum drops, hard candy, jelly beans, marshmallows, mints, and small amounts of dark chocolate.  Avoid: Commercially prepared cakes, pies, cookies, frosting, pudding, or mixes for these products. Desserts containing  whole milk products, chocolate, coconut, lard, palm oil, or palm kernel oil. Ice cream or ice cream drinks. Candy that contains chocolate, coconut, butter, hydrogenated fat, or unknown ingredients. Buttered syrups. Fats and Oils  Allowed: Vegetable oils: safflower, sunflower, corn, soybean, cottonseed, sesame, canola, olive, or peanut. Non-hydrogenated margarines. Salad dressing or mayonnaise: homemade or commercial, made with a recommended oil. Low or nonfat salad dressing or mayonnaise.  Limit added fats and oils to 6 to 8 tsp per day (includes fats used in cooking, baking, salads, and spreads on bread). Remember to count the "hidden fats" in foods.  Avoid: Solid fats and shortenings: butter, lard, salt pork, bacon drippings. Gravy containing meat fat, shortening, or suet. Cocoa butter, coconut. Coconut oil, palm oil, palm kernel oil, or hydrogenated oils: these ingredients are often used in bakery products, nondairy creamers, whipped toppings, candy, and commercially fried foods. Read labels carefully. Salad dressings made of unknown oils, sour cream, or cheese, such as blue cheese and Roquefort. Cream, all kinds: half-and-half, light, heavy, or whipping. Sour cream or cream cheese (even if "light" or low-fat). Nondairy cream substitutes: coffee creamers and sour cream substitutes made with palm, palm kernel, hydrogenated oils, or coconut oil. Beverages  Allowed: Coffee (regular or decaffeinated), tea. Diet carbonated beverages, mineral water. Alcohol: Check with your caregiver. Moderation is recommended.  Avoid: Whole milk, regular sodas, and juice drinks with added sugar. Condiments  Allowed: All seasonings and condiments. Cocoa powder. "Cream" sauces made with recommended ingredients.  Avoid: Carob powder made with hydrogenated fats. SAMPLE MENU Breakfast   cup orange juice   cup oatmeal  1 slice toast  1 tsp margarine  1 cup skim milk Lunch  [place] sandwich with 2 oz [place],  2 slices bread  Lettuce and tomato slices  Fresh fruit  Carrot sticks  Coffee or tea Snack  Fresh fruit or low-fat crackers Dinner  3 oz lean ground beef  1 baked potato  1 tsp margarine   cup asparagus  Lettuce salad  1 tbs non-creamy dressing   cup peach slices  1 cup skim milk Document Released: 11/30/2007 Document Revised: 08/22/2011 Document Reviewed: 04/22/2013 Exit[employer] Patient Information 2015 [place], [place]. This information is not intended to replace advice given to you by your health care provider. Make sure you discuss any questions you have with your health care provider.
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
## 2014-06-17 ENCOUNTER — Encounter: Payer: Self-pay | Admitting: Cardiology

## 2014-06-17 NOTE — Assessment & Plan Note (Signed)
Frankly no active heart failure symptoms. Not expectedly does have some exertional dyspnea.. S. Not limiting at this particular point.  Heard her dyspnea has improved significantly after having stopped smoking.  Her blood pressures now finally well controlled. Continue current medications including: Spironolactone, Imdur,hydralazine, carvedilol, amlodipine

## 2014-06-17 NOTE — Assessment & Plan Note (Signed)
Previously reported severe MR in the setting of dilated cardiomyopathy.  Now she is no longer volume overloaded with dilated LV, Her MR is no longer seen on echo as significant. This would suggest that her MR was functional as opposed to structural.  Monitor - low threshold to recheck Echo if murmur worsens or symptoms dictate.

## 2014-06-17 NOTE — Assessment & Plan Note (Addendum)
She doesn't complain of any significant claudication symptoms. She has some buttock claudication but not overly aggressive. Tolerating walking without too much difficulty. followup of routine Dopplers.

## 2014-06-17 NOTE — Assessment & Plan Note (Signed)
See above. Blood pressure at goal today. Continue current medications. Not on ACE inhibitor or ARB due to history of renal insufficiency. As her pressures are well controlled now we'll continue to avoid ACE inhibitor or ARB for now.  Using hydralazine./nitrate as an alternative.

## 2014-06-17 NOTE — Assessment & Plan Note (Signed)
Not overly symptomatic. On aggressive medications.

## 2014-06-17 NOTE — Assessment & Plan Note (Addendum)
I don't have any recent labs. She is due for lipid panel but did not get drawn last time I saw her. She actually has not been taking atorvastatin for some time now. I want her to restart it and we will recheck her lipids and chemistries. Also discussed dietary modification. May consider switching from atorvastatin to his Crestor for more potency.

## 2014-06-29 ENCOUNTER — Telehealth: Payer: Self-pay | Admitting: *Deleted

## 2014-06-29 NOTE — Telephone Encounter (Signed)
-----   Message from Marykay Lexavid W Harding, MD sent at 06/29/2014  1:48 PM EDT ----- Labs look outstanding. Total cholesterol is down to 134 and triglycerides 82. HDL is stable at 51 but LDL is down from 1:30 to 67. This is similar to the labs from 5 years ago.  Continue current regimen  [facility]

## 2014-06-29 NOTE — Telephone Encounter (Signed)
No answer, unable to leave a message. °

## 2014-06-30 ENCOUNTER — Encounter: Payer: Self-pay | Admitting: *Deleted

## 2014-08-18 ENCOUNTER — Emergency Department (HOSPITAL_COMMUNITY)
Admission: EM | Admit: 2014-08-18 | Discharge: 2014-08-18 | Disposition: A | Discharge status: Home / Self Care | Payer: Medicare Other | Attending: Emergency Medicine | Admitting: Emergency Medicine

## 2014-08-18 ENCOUNTER — Encounter (HOSPITAL_COMMUNITY): Payer: Self-pay | Admitting: *Deleted

## 2014-08-18 DIAGNOSIS — G8929 Other chronic pain: Secondary | ICD-10-CM | POA: Diagnosis not present

## 2014-08-18 DIAGNOSIS — I2510 Atherosclerotic heart disease of native coronary artery without angina pectoris: Secondary | ICD-10-CM | POA: Insufficient documentation

## 2014-08-18 DIAGNOSIS — Z8659 Personal history of other mental and behavioral disorders: Secondary | ICD-10-CM | POA: Diagnosis not present

## 2014-08-18 DIAGNOSIS — Z87891 Personal history of nicotine dependence: Secondary | ICD-10-CM | POA: Diagnosis not present

## 2014-08-18 DIAGNOSIS — I129 Hypertensive chronic kidney disease with stage 1 through stage 4 chronic kidney disease, or unspecified chronic kidney disease: Secondary | ICD-10-CM | POA: Diagnosis not present

## 2014-08-18 DIAGNOSIS — N182 Chronic kidney disease, stage 2 (mild): Secondary | ICD-10-CM | POA: Diagnosis not present

## 2014-08-18 DIAGNOSIS — I519 Heart disease, unspecified: Secondary | ICD-10-CM | POA: Diagnosis not present

## 2014-08-18 DIAGNOSIS — Z9889 Other specified postprocedural states: Secondary | ICD-10-CM | POA: Insufficient documentation

## 2014-08-18 DIAGNOSIS — Z79899 Other long term (current) drug therapy: Secondary | ICD-10-CM | POA: Diagnosis not present

## 2014-08-18 DIAGNOSIS — R011 Cardiac murmur, unspecified: Secondary | ICD-10-CM | POA: Insufficient documentation

## 2014-08-18 DIAGNOSIS — M545 Low back pain, unspecified: Secondary | ICD-10-CM

## 2014-08-18 DIAGNOSIS — E785 Hyperlipidemia, unspecified: Secondary | ICD-10-CM | POA: Diagnosis not present

## 2014-08-18 DIAGNOSIS — Z7982 Long term (current) use of aspirin: Secondary | ICD-10-CM | POA: Diagnosis not present

## 2014-08-18 DIAGNOSIS — M549 Dorsalgia, unspecified: Secondary | ICD-10-CM

## 2014-08-18 MED ORDER — IBUPROFEN 800 MG PO TABS: 800.0000 mg | ORAL_TABLET | Freq: Three times a day (TID) | ORAL | Status: DC

## 2014-08-18 MED ORDER — CYCLOBENZAPRINE HCL 10 MG PO TABS
5.0000 mg | ORAL_TABLET | Freq: Once | ORAL | Status: AC
  Administered 2014-08-18: 5 mg via ORAL
  Filled 2014-08-18: qty 1

## 2014-08-18 MED ORDER — IBUPROFEN 400 MG PO TABS
800.0000 mg | ORAL_TABLET | Freq: Once | ORAL | Status: AC
  Administered 2014-08-18: 800 mg via ORAL
  Filled 2014-08-18: qty 2

## 2014-08-18 MED ORDER — CYCLOBENZAPRINE HCL 5 MG PO TABS: 5.0000 mg | ORAL_TABLET | Freq: Two times a day (BID) | ORAL | Status: DC | PRN
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
## 2014-08-18 NOTE — Discharge Instructions (Signed)
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
## 2014-08-24 ENCOUNTER — Other Ambulatory Visit: Payer: Self-pay | Admitting: Neurological Surgery

## 2014-09-16 ENCOUNTER — Encounter (HOSPITAL_COMMUNITY)
Admission: RE | Admit: 2014-09-16 | Discharge: 2014-09-16 | Disposition: A | Discharge status: Home / Self Care | Payer: Medicare Other | Source: Ambulatory Visit | Attending: Neurological Surgery | Admitting: Neurological Surgery

## 2014-09-16 DIAGNOSIS — Z0183 Encounter for blood typing: Secondary | ICD-10-CM | POA: Diagnosis not present

## 2014-09-16 DIAGNOSIS — Z01812 Encounter for preprocedural laboratory examination: Secondary | ICD-10-CM | POA: Insufficient documentation

## 2014-09-16 DIAGNOSIS — M7130 Other bursal cyst, unspecified site: Secondary | ICD-10-CM | POA: Diagnosis not present

## 2014-09-16 LAB — BASIC METABOLIC PANEL
ANION GAP: 5 (ref 5–15)
BUN: 19 mg/dL (ref 6–20)
CALCIUM: 9.8 mg/dL (ref 8.9–10.3)
CO2: 27 mmol/L (ref 22–32)
Chloride: 105 mmol/L (ref 101–111)
Creatinine, Ser: 1.19 mg/dL — ABNORMAL HIGH (ref 0.44–1.00)
GFR, EST AFRICAN AMERICAN: 55 mL/min — AB (ref >60)
GFR, EST NON AFRICAN AMERICAN: 48 mL/min — AB (ref >60)
Glucose, Bld: 137 mg/dL — ABNORMAL HIGH (ref 65–99)
Potassium: 4.1 mmol/L (ref 3.5–5.1)
Sodium: 137 mmol/L (ref 135–145)

## 2014-09-16 LAB — CBC WITH DIFFERENTIAL/PLATELET
BASOS PCT: 0 % (ref 0–1)
Basophils Absolute: 0 10*3/uL (ref 0.0–0.1)
EOS PCT: 2 % (ref 0–5)
Eosinophils Absolute: 0.1 10*3/uL (ref 0.0–0.7)
HCT: 37.1 % (ref 36.0–46.0)
Hemoglobin: 12.3 g/dL (ref 12.0–15.0)
LYMPHS PCT: 31 % (ref 12–46)
Lymphs Abs: 1.9 10*3/uL (ref 0.7–4.0)
MCH: 30.8 pg (ref 26.0–34.0)
MCHC: 33.2 g/dL (ref 30.0–36.0)
MCV: 92.8 fL (ref 78.0–100.0)
MONO ABS: 0.4 10*3/uL (ref 0.1–1.0)
MONOS PCT: 6 % (ref 3–12)
NEUTROS ABS: 3.7 10*3/uL (ref 1.7–7.7)
Neutrophils Relative %: 61 % (ref 43–77)
PLATELETS: 309 10*3/uL (ref 150–400)
RBC: 4 MIL/uL (ref 3.87–5.11)
RDW: 12.7 % (ref 11.5–15.5)
WBC: 6.1 10*3/uL (ref 4.0–10.5)

## 2014-09-16 LAB — PROTIME-INR
INR: 1.07 (ref 0.00–1.49)
Prothrombin Time: 14.1 seconds (ref 11.6–15.2)

## 2014-09-16 LAB — TYPE AND SCREEN
ABO/RH(D): B POS
Antibody Screen: NEGATIVE

## 2014-09-16 LAB — SURGICAL PCR SCREEN
MRSA, PCR: NEGATIVE
Staphylococcus aureus: NEGATIVE

## 2014-09-16 LAB — ABO/RH: ABO/RH(D): B POS

## 2014-09-16 NOTE — Pre-Procedure Instructions (Signed)
    Wyvonnia LoraMae H Kalas  09/16/2014      [facility] - [place], [place] - [address]izbeth Squires[address] [place] [id number] Phone: [phone] Fax: [phone]    Your procedure is scheduled on September 24, 2014.  [facility] Admitting at 5:30 A.M.  Call this number if you have problems the morning of surgery:  [phone]   Remember:  Do not eat food or drink liquids after midnight.  Take these medicines the morning of surgery with A SIP OF WATER : amLODipine (NORVASC), carvedilol (COREG), hydrALAZINE (APRESOLINE) , isosorbide  mononitrate (IMDUR), omeprazole (PRILOSEC), DIGITEK   IF NEEDED: pain pill, muscle relaxer    STOP ASPIRIN, NSAIDS (ADVIL, IBUPROFEN, ALEVE), HERBAL MEDICATIONS ONE WEEK PRIOR TO SURGERY    Do not wear jewelry, make-up or nail polish.  Do not wear lotions, powders, or perfumes.  You may wear deodorant.  Do not shave 48 hours prior to surgery.    Do not bring valuables to the hospital.  [facility] for any belongings or valuables.  Contacts, dentures or bridgework may not be worn into surgery.  Leave your suitcase in the car.  After surgery it may be brought to your room.  For patients admitted to the hospital, discharge time will be determined by your treatment team.  Patients discharged the day of surgery will not be allowed to drive home.   Name and phone number of your driver:    Special instructions:  "preparing for surgery"  Please read over the following fact sheets that you were given. Pain Booklet, Coughing and Deep Breathing, Blood Transfusion Information and Surgical Site Infection Prevention

## 2014-09-23 MED ORDER — CEFAZOLIN SODIUM-DEXTROSE 2-3 GM-% IV SOLR
2.0000 g | INTRAVENOUS | Status: AC
  Administered 2014-09-24: 2 g via INTRAVENOUS
  Filled 2014-09-23: qty 50

## 2014-09-23 MED ORDER — DEXAMETHASONE SODIUM PHOSPHATE 10 MG/ML IJ SOLN
10.0000 mg | INTRAMUSCULAR | Status: AC
  Administered 2014-09-24: 10 mg via INTRAVENOUS
  Filled 2014-09-23: qty 1

## 2014-09-24 ENCOUNTER — Inpatient Hospital Stay (HOSPITAL_COMMUNITY): Payer: Medicare Other | Admitting: Anesthesiology

## 2014-09-24 ENCOUNTER — Inpatient Hospital Stay (HOSPITAL_COMMUNITY): Payer: Medicare Other

## 2014-09-24 ENCOUNTER — Encounter (HOSPITAL_COMMUNITY)
Admission: RE | Disposition: A | Discharge status: Home / Self Care | Payer: Medicare Other | Source: Ambulatory Visit | Attending: Neurological Surgery

## 2014-09-24 ENCOUNTER — Inpatient Hospital Stay (HOSPITAL_COMMUNITY)
Admission: RE | Admit: 2014-09-24 | Discharge: 2014-09-28 | DRG: 516 | Disposition: A | Discharge status: Home / Self Care | Payer: Medicare Other | Source: Ambulatory Visit | Attending: Neurological Surgery | Admitting: Neurological Surgery

## 2014-09-24 ENCOUNTER — Encounter (HOSPITAL_COMMUNITY): Payer: Self-pay | Admitting: *Deleted

## 2014-09-24 DIAGNOSIS — Z79899 Other long term (current) drug therapy: Secondary | ICD-10-CM

## 2014-09-24 DIAGNOSIS — Z8249 Family history of ischemic heart disease and other diseases of the circulatory system: Secondary | ICD-10-CM | POA: Diagnosis not present

## 2014-09-24 DIAGNOSIS — I422 Other hypertrophic cardiomyopathy: Secondary | ICD-10-CM | POA: Diagnosis present

## 2014-09-24 DIAGNOSIS — E785 Hyperlipidemia, unspecified: Secondary | ICD-10-CM | POA: Diagnosis present

## 2014-09-24 DIAGNOSIS — M4806 Spinal stenosis, lumbar region: Secondary | ICD-10-CM | POA: Diagnosis not present

## 2014-09-24 DIAGNOSIS — I129 Hypertensive chronic kidney disease with stage 1 through stage 4 chronic kidney disease, or unspecified chronic kidney disease: Secondary | ICD-10-CM | POA: Diagnosis not present

## 2014-09-24 DIAGNOSIS — Z87891 Personal history of nicotine dependence: Secondary | ICD-10-CM

## 2014-09-24 DIAGNOSIS — N182 Chronic kidney disease, stage 2 (mild): Secondary | ICD-10-CM | POA: Diagnosis not present

## 2014-09-24 DIAGNOSIS — Z79891 Long term (current) use of opiate analgesic: Secondary | ICD-10-CM | POA: Diagnosis not present

## 2014-09-24 DIAGNOSIS — I2510 Atherosclerotic heart disease of native coronary artery without angina pectoris: Secondary | ICD-10-CM | POA: Diagnosis present

## 2014-09-24 DIAGNOSIS — Z7982 Long term (current) use of aspirin: Secondary | ICD-10-CM

## 2014-09-24 DIAGNOSIS — Z981 Arthrodesis status: Secondary | ICD-10-CM

## 2014-09-24 DIAGNOSIS — M7138 Other bursal cyst, other site: Secondary | ICD-10-CM | POA: Diagnosis present

## 2014-09-24 DIAGNOSIS — M5489 Other dorsalgia: Secondary | ICD-10-CM | POA: Diagnosis present

## 2014-09-24 DIAGNOSIS — I700 Atherosclerosis of aorta: Secondary | ICD-10-CM | POA: Diagnosis present

## 2014-09-24 DIAGNOSIS — G8929 Other chronic pain: Secondary | ICD-10-CM | POA: Diagnosis present

## 2014-09-24 DIAGNOSIS — M549 Dorsalgia, unspecified: Secondary | ICD-10-CM | POA: Diagnosis present

## 2014-09-24 DIAGNOSIS — I739 Peripheral vascular disease, unspecified: Secondary | ICD-10-CM | POA: Diagnosis present

## 2014-09-24 DIAGNOSIS — M4326 Fusion of spine, lumbar region: Secondary | ICD-10-CM

## 2014-09-24 SURGERY — POSTERIOR LUMBAR FUSION 2 LEVEL
Anesthesia: General | Site: Back

## 2014-09-24 MED ORDER — ONDANSETRON HCL 4 MG/2ML IJ SOLN
4.0000 mg | INTRAMUSCULAR | Status: DC | PRN
  Administered 2014-09-24 – 2014-09-25 (×3): 4 mg via INTRAVENOUS
  Filled 2014-09-24 (×3): qty 2

## 2014-09-24 MED ORDER — CARVEDILOL 12.5 MG PO TABS
25.0000 mg | ORAL_TABLET | Freq: Two times a day (BID) | ORAL | Status: DC
  Administered 2014-09-24 – 2014-09-28 (×9): 25 mg via ORAL
  Filled 2014-09-24 (×9): qty 2

## 2014-09-24 MED ORDER — STERILE WATER FOR INJECTION IJ SOLN
INTRAMUSCULAR | Status: AC
  Filled 2014-09-24: qty 20

## 2014-09-24 MED ORDER — HYDRALAZINE HCL 50 MG PO TABS
50.0000 mg | ORAL_TABLET | Freq: Three times a day (TID) | ORAL | Status: DC
  Administered 2014-09-24 – 2014-09-28 (×11): 50 mg via ORAL
  Filled 2014-09-24 (×12): qty 1

## 2014-09-24 MED ORDER — PROPOFOL 10 MG/ML IV BOLUS
INTRAVENOUS | Status: DC | PRN
  Administered 2014-09-24: 80 mg via INTRAVENOUS
  Administered 2014-09-24: 120 mg via INTRAVENOUS

## 2014-09-24 MED ORDER — HYDROMORPHONE HCL 1 MG/ML IJ SOLN
0.2500 mg | INTRAMUSCULAR | Status: DC | PRN
  Administered 2014-09-24 (×4): 0.25 mg via INTRAVENOUS

## 2014-09-24 MED ORDER — ONDANSETRON HCL 4 MG/2ML IJ SOLN: 4.0000 mg | Freq: Once | INTRAMUSCULAR | Status: DC | PRN

## 2014-09-24 MED ORDER — MENTHOL 3 MG MT LOZG: 1.0000 | LOZENGE | OROMUCOSAL | Status: DC | PRN

## 2014-09-24 MED ORDER — ACETAMINOPHEN 325 MG PO TABS: 650.0000 mg | ORAL_TABLET | ORAL | Status: DC | PRN

## 2014-09-24 MED ORDER — GLYCOPYRROLATE 0.2 MG/ML IJ SOLN
INTRAMUSCULAR | Status: AC
  Filled 2014-09-24: qty 3

## 2014-09-24 MED ORDER — ROCURONIUM BROMIDE 50 MG/5ML IV SOLN
INTRAVENOUS | Status: AC
  Filled 2014-09-24: qty 1

## 2014-09-24 MED ORDER — VECURONIUM BROMIDE 10 MG IV SOLR
INTRAVENOUS | Status: DC | PRN
Start: 2014-09-24 — End: 2014-09-24
  Administered 2014-09-24: 5 mg via INTRAVENOUS

## 2014-09-24 MED ORDER — ISOSORBIDE MONONITRATE ER 30 MG PO TB24
60.0000 mg | ORAL_TABLET | Freq: Every day | ORAL | Status: DC
  Administered 2014-09-25 – 2014-09-28 (×4): 60 mg via ORAL
  Filled 2014-09-24 (×4): qty 2

## 2014-09-24 MED ORDER — PHENYLEPHRINE HCL 10 MG/ML IJ SOLN
INTRAMUSCULAR | Status: DC | PRN
  Administered 2014-09-24: 80 ug via INTRAVENOUS

## 2014-09-24 MED ORDER — SUGAMMADEX SODIUM 500 MG/5ML IV SOLN
INTRAVENOUS | Status: AC
  Filled 2014-09-24: qty 5

## 2014-09-24 MED ORDER — MEPERIDINE HCL 25 MG/ML IJ SOLN: 6.2500 mg | INTRAMUSCULAR | Status: DC | PRN

## 2014-09-24 MED ORDER — ONDANSETRON HCL 4 MG/2ML IJ SOLN
INTRAMUSCULAR | Status: DC | PRN
  Administered 2014-09-24 (×2): 4 mg via INTRAVENOUS

## 2014-09-24 MED ORDER — SODIUM CHLORIDE 0.9 % IR SOLN
Status: DC | PRN
  Administered 2014-09-24: 500 mL

## 2014-09-24 MED ORDER — LIDOCAINE HCL (CARDIAC) 20 MG/ML IV SOLN
INTRAVENOUS | Status: AC
  Filled 2014-09-24: qty 20

## 2014-09-24 MED ORDER — SUCCINYLCHOLINE CHLORIDE 20 MG/ML IJ SOLN
INTRAMUSCULAR | Status: AC
  Filled 2014-09-24: qty 1

## 2014-09-24 MED ORDER — BUPIVACAINE HCL (PF) 0.25 % IJ SOLN
INTRAMUSCULAR | Status: DC | PRN
  Administered 2014-09-24: 4 mL

## 2014-09-24 MED ORDER — ROCURONIUM BROMIDE 100 MG/10ML IV SOLN
INTRAVENOUS | Status: DC | PRN
  Administered 2014-09-24: 10 mg via INTRAVENOUS

## 2014-09-24 MED ORDER — VECURONIUM BROMIDE 10 MG IV SOLR
INTRAVENOUS | Status: AC
  Filled 2014-09-24: qty 10

## 2014-09-24 MED ORDER — CEFAZOLIN SODIUM 1-5 GM-% IV SOLN
1.0000 g | Freq: Three times a day (TID) | INTRAVENOUS | Status: AC
  Administered 2014-09-24 (×2): 1 g via INTRAVENOUS
  Filled 2014-09-24 (×2): qty 50

## 2014-09-24 MED ORDER — NEOSTIGMINE METHYLSULFATE 10 MG/10ML IV SOLN
INTRAVENOUS | Status: AC
  Filled 2014-09-24: qty 1

## 2014-09-24 MED ORDER — LIDOCAINE HCL (CARDIAC) 20 MG/ML IV SOLN
INTRAVENOUS | Status: DC | PRN
  Administered 2014-09-24: 100 mg via INTRAVENOUS

## 2014-09-24 MED ORDER — PROPOFOL 10 MG/ML IV BOLUS
INTRAVENOUS | Status: AC
  Filled 2014-09-24: qty 20

## 2014-09-24 MED ORDER — FENTANYL CITRATE (PF) 250 MCG/5ML IJ SOLN
INTRAMUSCULAR | Status: AC
  Filled 2014-09-24: qty 5

## 2014-09-24 MED ORDER — ALUM & MAG HYDROXIDE-SIMETH 200-200-20 MG/5ML PO SUSP
15.0000 mL | Freq: Four times a day (QID) | ORAL | Status: DC | PRN
  Administered 2014-09-24 – 2014-09-26 (×2): 15 mL via ORAL
  Filled 2014-09-24 (×2): qty 30

## 2014-09-24 MED ORDER — MORPHINE SULFATE 2 MG/ML IJ SOLN
1.0000 mg | INTRAMUSCULAR | Status: DC | PRN
  Administered 2014-09-26 – 2014-09-27 (×3): 2 mg via INTRAVENOUS
  Filled 2014-09-24 (×4): qty 1

## 2014-09-24 MED ORDER — LACTATED RINGERS IV SOLN
INTRAVENOUS | Status: DC | PRN
  Administered 2014-09-24 (×3): via INTRAVENOUS

## 2014-09-24 MED ORDER — SODIUM CHLORIDE 0.9 % IV SOLN
10.0000 mg | INTRAVENOUS | Status: DC | PRN
  Administered 2014-09-24: 20 ug/min via INTRAVENOUS

## 2014-09-24 MED ORDER — SUGAMMADEX SODIUM 500 MG/5ML IV SOLN
INTRAVENOUS | Status: DC | PRN
  Administered 2014-09-24: 200 mg via INTRAVENOUS

## 2014-09-24 MED ORDER — ARTIFICIAL TEARS OP OINT
TOPICAL_OINTMENT | OPHTHALMIC | Status: DC | PRN
  Administered 2014-09-24: 1 via OPHTHALMIC

## 2014-09-24 MED ORDER — PHENOL 1.4 % MT LIQD: 1.0000 | OROMUCOSAL | Status: DC | PRN

## 2014-09-24 MED ORDER — CELECOXIB 200 MG PO CAPS
200.0000 mg | ORAL_CAPSULE | Freq: Two times a day (BID) | ORAL | Status: DC
  Administered 2014-09-24 – 2014-09-28 (×8): 200 mg via ORAL
  Filled 2014-09-24 (×8): qty 1

## 2014-09-24 MED ORDER — DOCUSATE SODIUM 100 MG PO CAPS
100.0000 mg | ORAL_CAPSULE | Freq: Two times a day (BID) | ORAL | Status: DC
  Administered 2014-09-24 – 2014-09-28 (×8): 100 mg via ORAL
  Filled 2014-09-24 (×8): qty 1

## 2014-09-24 MED ORDER — SUCRALFATE 1 G PO TABS
1.0000 g | ORAL_TABLET | Freq: Four times a day (QID) | ORAL | Status: DC
  Administered 2014-09-24 – 2014-09-28 (×16): 1 g via ORAL
  Filled 2014-09-24 (×16): qty 1

## 2014-09-24 MED ORDER — ACETAMINOPHEN 650 MG RE SUPP: 650.0000 mg | RECTAL | Status: DC | PRN

## 2014-09-24 MED ORDER — MIDAZOLAM HCL 5 MG/5ML IJ SOLN
INTRAMUSCULAR | Status: DC | PRN
  Administered 2014-09-24: 2 mg via INTRAVENOUS

## 2014-09-24 MED ORDER — SODIUM CHLORIDE 0.9 % IJ SOLN
3.0000 mL | INTRAMUSCULAR | Status: DC | PRN
Start: 2014-09-24 — End: 2014-09-28

## 2014-09-24 MED ORDER — SODIUM CHLORIDE 0.9 % IJ SOLN
INTRAMUSCULAR | Status: AC
  Filled 2014-09-24: qty 30

## 2014-09-24 MED ORDER — POTASSIUM CHLORIDE IN NACL 20-0.9 MEQ/L-% IV SOLN
INTRAVENOUS | Status: DC
  Administered 2014-09-24: 15:00:00 via INTRAVENOUS
  Administered 2014-09-25: 1000 mL via INTRAVENOUS
  Filled 2014-09-24 (×4): qty 1000

## 2014-09-24 MED ORDER — THROMBIN 5000 UNITS EX SOLR
OROMUCOSAL | Status: DC | PRN
  Administered 2014-09-24: 5 mL via TOPICAL

## 2014-09-24 MED ORDER — SURGIFOAM 100 EX MISC
CUTANEOUS | Status: DC | PRN
  Administered 2014-09-24: 20 mL via TOPICAL

## 2014-09-24 MED ORDER — FENTANYL CITRATE (PF) 100 MCG/2ML IJ SOLN
INTRAMUSCULAR | Status: DC | PRN
  Administered 2014-09-24 (×2): 50 ug via INTRAVENOUS
  Administered 2014-09-24 (×2): 100 ug via INTRAVENOUS
  Administered 2014-09-24 (×2): 50 ug via INTRAVENOUS

## 2014-09-24 MED ORDER — SODIUM CHLORIDE 0.9 % IV SOLN: 250.0000 mL | INTRAVENOUS | Status: DC

## 2014-09-24 MED ORDER — AMLODIPINE BESYLATE 10 MG PO TABS
10.0000 mg | ORAL_TABLET | Freq: Every day | ORAL | Status: DC
Start: 2014-09-25 — End: 2014-09-28
  Administered 2014-09-25 – 2014-09-28 (×4): 10 mg via ORAL
  Filled 2014-09-24 (×4): qty 1

## 2014-09-24 MED ORDER — ASPIRIN EC 81 MG PO TBEC
81.0000 mg | DELAYED_RELEASE_TABLET | Freq: Every day | ORAL | Status: DC
  Administered 2014-09-25 – 2014-09-28 (×4): 81 mg via ORAL
  Filled 2014-09-24 (×4): qty 1

## 2014-09-24 MED ORDER — 0.9 % SODIUM CHLORIDE (POUR BTL) OPTIME
TOPICAL | Status: DC | PRN
  Administered 2014-09-24: 1000 mL

## 2014-09-24 MED ORDER — CYCLOBENZAPRINE HCL 5 MG PO TABS
5.0000 mg | ORAL_TABLET | Freq: Two times a day (BID) | ORAL | Status: DC | PRN
  Administered 2014-09-26: 5 mg via ORAL
  Filled 2014-09-24 (×2): qty 1

## 2014-09-24 MED ORDER — OXYCODONE-ACETAMINOPHEN 5-325 MG PO TABS
1.0000 | ORAL_TABLET | ORAL | Status: DC | PRN
  Administered 2014-09-24 – 2014-09-27 (×14): 2 via ORAL
  Administered 2014-09-28: 1 via ORAL
  Administered 2014-09-28 (×2): 2 via ORAL
  Filled 2014-09-24 (×17): qty 2

## 2014-09-24 MED ORDER — DIGOXIN 125 MCG PO TABS
125.0000 ug | ORAL_TABLET | Freq: Every day | ORAL | Status: DC
  Administered 2014-09-25 – 2014-09-28 (×4): 125 ug via ORAL
  Filled 2014-09-24 (×4): qty 1

## 2014-09-24 MED ORDER — SODIUM CHLORIDE 0.9 % IJ SOLN
3.0000 mL | Freq: Two times a day (BID) | INTRAMUSCULAR | Status: DC
  Administered 2014-09-24 – 2014-09-28 (×4): 3 mL via INTRAVENOUS

## 2014-09-24 MED ORDER — SPIRONOLACTONE 25 MG PO TABS
12.5000 mg | ORAL_TABLET | Freq: Every morning | ORAL | Status: DC
  Administered 2014-09-25 – 2014-09-28 (×4): 12.5 mg via ORAL
  Filled 2014-09-24 (×4): qty 1

## 2014-09-24 MED ORDER — MIDAZOLAM HCL 2 MG/2ML IJ SOLN
INTRAMUSCULAR | Status: AC
  Filled 2014-09-24: qty 2

## 2014-09-24 MED ORDER — PROPOFOL INFUSION 10 MG/ML OPTIME
INTRAVENOUS | Status: DC | PRN
  Administered 2014-09-24: 50 ug/kg/min via INTRAVENOUS

## 2014-09-24 MED ORDER — SUCCINYLCHOLINE CHLORIDE 20 MG/ML IJ SOLN
INTRAMUSCULAR | Status: DC | PRN
  Administered 2014-09-24: 160 mg via INTRAVENOUS

## 2014-09-24 MED ORDER — EPHEDRINE SULFATE 50 MG/ML IJ SOLN
INTRAMUSCULAR | Status: AC
  Filled 2014-09-24: qty 2

## 2014-09-24 MED ORDER — EPHEDRINE SULFATE 50 MG/ML IJ SOLN
INTRAMUSCULAR | Status: DC | PRN
  Administered 2014-09-24: 10 mg via INTRAVENOUS
  Administered 2014-09-24: 20 mg via INTRAVENOUS

## 2014-09-24 MED ORDER — HYDROMORPHONE HCL 1 MG/ML IJ SOLN
INTRAMUSCULAR | Status: AC
  Administered 2014-09-24: 0.25 mg via INTRAVENOUS
  Filled 2014-09-24: qty 1

## 2014-09-24 SURGICAL SUPPLY — 68 items
APL SKNCLS STERI-STRIP NONHPOA (GAUZE/BANDAGES/DRESSINGS) ×1
BAG DECANTER FOR FLEXI CONT (MISCELLANEOUS) ×3 IMPLANT
BENZOIN TINCTURE PRP APPL 2/3 (GAUZE/BANDAGES/DRESSINGS) ×3 IMPLANT
BIT DRILL PLIF MAS 5.0MM DISP (DRILL) IMPLANT
BLADE CLIPPER SURG (BLADE) IMPLANT
BONE MATRIX OSTEOCEL PRO MED (Bone Implant) ×4 IMPLANT
BUR MATCHSTICK NEURO 3.0 LAGG (BURR) ×3 IMPLANT
CANISTER SUCT 3000ML PPV (MISCELLANEOUS) ×3 IMPLANT
CLIP NEUROVISION LG (CLIP) ×2 IMPLANT
CLOSURE WOUND 1/2 X4 (GAUZE/BANDAGES/DRESSINGS) ×2
CONT SPEC 4OZ CLIKSEAL STRL BL (MISCELLANEOUS) ×6 IMPLANT
COVER BACK TABLE 60X90IN (DRAPES) ×3 IMPLANT
DRAPE C-ARM 42X72 X-RAY (DRAPES) ×6 IMPLANT
DRAPE LAPAROTOMY 100X72X124 (DRAPES) ×3 IMPLANT
DRAPE POUCH INSTRU U-SHP 10X18 (DRAPES) ×3 IMPLANT
DRAPE SURG 17X23 STRL (DRAPES) ×3 IMPLANT
DRILL PLIF MAS 5.0MM DISP (DRILL) ×3
DRSG OPSITE 4X5.5 SM (GAUZE/BANDAGES/DRESSINGS) ×2 IMPLANT
DRSG OPSITE POSTOP 4X6 (GAUZE/BANDAGES/DRESSINGS) ×2 IMPLANT
DRSG TELFA 3X8 NADH (GAUZE/BANDAGES/DRESSINGS) IMPLANT
DURAPREP 26ML APPLICATOR (WOUND CARE) ×3 IMPLANT
ELECT BLADE 4.0 EZ CLEAN MEGAD (MISCELLANEOUS) ×3
ELECT REM PT RETURN 9FT ADLT (ELECTROSURGICAL) ×3
ELECTRODE BLDE 4.0 EZ CLN MEGD (MISCELLANEOUS) IMPLANT
ELECTRODE REM PT RTRN 9FT ADLT (ELECTROSURGICAL) ×1 IMPLANT
EVACUATOR 1/8 PVC DRAIN (DRAIN) ×3 IMPLANT
GAUZE SPONGE 4X4 16PLY XRAY LF (GAUZE/BANDAGES/DRESSINGS) IMPLANT
GLOVE BIO SURGEON STRL SZ8 (GLOVE) ×6 IMPLANT
GOWN STRL REUS W/ TWL LRG LVL3 (GOWN DISPOSABLE) IMPLANT
GOWN STRL REUS W/ TWL XL LVL3 (GOWN DISPOSABLE) ×2 IMPLANT
GOWN STRL REUS W/TWL 2XL LVL3 (GOWN DISPOSABLE) IMPLANT
GOWN STRL REUS W/TWL LRG LVL3 (GOWN DISPOSABLE) ×3
GOWN STRL REUS W/TWL XL LVL3 (GOWN DISPOSABLE) ×6
HEMOSTAT POWDER KIT SURGIFOAM (HEMOSTASIS) ×2 IMPLANT
KIT BASIN OR (CUSTOM PROCEDURE TRAY) ×3 IMPLANT
KIT NDL NVM5 EMG ELECT (KITS) IMPLANT
KIT NEEDLE NVM5 EMG ELECT (KITS) ×1 IMPLANT
KIT NEEDLE NVM5 EMG ELECTRODE (KITS) ×2
KIT ROOM TURNOVER OR (KITS) ×3 IMPLANT
NDL ASP BONE MRW 8GX15 (NEEDLE) IMPLANT
NDL HYPO 25X1 1.5 SAFETY (NEEDLE) ×1 IMPLANT
NEEDLE ASP BONE MRW 8GX15 (NEEDLE) ×3 IMPLANT
NEEDLE HYPO 25X1 1.5 SAFETY (NEEDLE) ×3 IMPLANT
NS IRRIG 1000ML POUR BTL (IV SOLUTION) ×3 IMPLANT
PACK LAMINECTOMY NEURO (CUSTOM PROCEDURE TRAY) ×3 IMPLANT
PAD ARMBOARD 7.5X6 YLW CONV (MISCELLANEOUS) ×9 IMPLANT
PAD DRESSING TELFA 3X8 NADH (GAUZE/BANDAGES/DRESSINGS) ×1 IMPLANT
ROD 60MM LUMBAR (Rod) ×4 IMPLANT
SCREW LOCK (Screw) ×18 IMPLANT
SCREW LOCK FXNS SPNE MAS PL (Screw) IMPLANT
SCREW SHANK 5.0X35 (Screw) ×8 IMPLANT
SCREW SHANK 6.5X65 (Screw) ×4 IMPLANT
SCREW TULIP 5.5 (Screw) ×12 IMPLANT
SPONGE LAP 4X18 X RAY DECT (DISPOSABLE) IMPLANT
SPONGE SURGIFOAM ABS GEL 100 (HEMOSTASIS) ×3 IMPLANT
STRIP BIOACTIVE VITOSS 25X100X (Neuro Prosthesis/Implant) ×2 IMPLANT
STRIP CLOSURE SKIN 1/2X4 (GAUZE/BANDAGES/DRESSINGS) ×4 IMPLANT
SUT VIC AB 0 CT1 18XCR BRD8 (SUTURE) ×1 IMPLANT
SUT VIC AB 0 CT1 8-18 (SUTURE) ×6
SUT VIC AB 2-0 CP2 18 (SUTURE) ×5 IMPLANT
SUT VIC AB 3-0 SH 8-18 (SUTURE) ×6 IMPLANT
SYR 20ML ECCENTRIC (SYRINGE) ×3 IMPLANT
TAPE STRIPS DRAPE STRL (GAUZE/BANDAGES/DRESSINGS) ×2 IMPLANT
TOWEL OR 17X24 6PK STRL BLUE (TOWEL DISPOSABLE) ×3 IMPLANT
TOWEL OR 17X26 10 PK STRL BLUE (TOWEL DISPOSABLE) ×3 IMPLANT
TRAP SPECIMEN MUCOUS 40CC (MISCELLANEOUS) ×2 IMPLANT
TRAY FOLEY W/METER SILVER 14FR (SET/KITS/TRAYS/PACK) ×3 IMPLANT
WATER STERILE IRR 1000ML POUR (IV SOLUTION) ×3 IMPLANT
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
## 2014-09-24 NOTE — Progress Notes (Signed)
Patient is admitted to 4N21 from PACU

## 2014-09-24 NOTE — Transfer of Care (Signed)
Immediate Anesthesia Transfer of Care Note  Patient: Erin Riley  Procedure(s) Performed: Procedure(s) with comments: PLIF - L4-L5 - L5-S1  (N/A) - MAS PLIF  Patient Location: PACU  Anesthesia Type:General  Level of Consciousness: awake, alert , oriented and patient cooperative  Airway & Oxygen Therapy: Patient Spontanous Breathing and Patient connected to nasal cannula oxygen  Post-op Assessment: Report given to RN, Post -op Vital signs reviewed and stable and Patient moving all extremities  Post vital signs: Reviewed and stable  Last Vitals:  Filed Vitals:   09/24/14 0622  BP: 100/43  Pulse: 70  Temp: 36.3 C  Resp: 18    Complications: No apparent anesthesia complications

## 2014-09-24 NOTE — Anesthesia Postprocedure Evaluation (Signed)
Anesthesia Post Note  Patient: Erin Riley  Procedure(s) Performed: Procedure(s) (LRB): PLIF - L4-L5 - L5-S1  (N/A)  Anesthesia type: general  Patient location: PACU  Post pain: Pain level controlled  Post assessment: Patient's Cardiovascular Status Stable  Last Vitals:  Filed Vitals:   09/24/14 1300  BP:   Pulse:   Temp: 36.1 C  Resp:     Post vital signs: Reviewed and stable  Level of consciousness: sedated  Complications: No apparent anesthesia complications

## 2014-09-24 NOTE — Anesthesia Procedure Notes (Signed)
Procedure Name: Intubation Date/Time: 09/24/2014 7:42 AM Performed by: Wray Kearns A Pre-anesthesia Checklist: Patient identified, Timeout performed, Emergency Drugs available, Suction available and Patient being monitored Patient Re-evaluated:Patient Re-evaluated prior to inductionOxygen Delivery Method: Circle system utilized Preoxygenation: Pre-oxygenation with 100% oxygen Intubation Type: IV induction and Cricoid Pressure applied Ventilation: Mask ventilation without difficulty and Oral airway inserted - appropriate to patient size Laryngoscope Size: Mac and 4 Grade View: Grade I Tube type: Oral Number of attempts: 1 Airway Equipment and Method: Stylet Placement Confirmation: ETT inserted through vocal cords under direct vision,  breath sounds checked- equal and bilateral and positive ETCO2 Secured at: 20 cm Tube secured with: Tape Dental Injury: Teeth and Oropharynx as per pre-operative assessment

## 2014-09-24 NOTE — H&P (Signed)
Subjective Patient is a 64 y.o. female admitted for PLIF. Onset of symptoms was many months ago, worse since that time.  The pain is rated severe, and is located at the low back and radiates to legs. The pain is described as aching and occurs al day. The symptoms have been progressive. Symptoms are exacerbated by activity. MRI or CT showed stenosis/ spondylolisthesis.  Past Medical History  Diagnosis Date  . Hypertension   . Left ventricular diastolic dysfunction, NYHA class 1 March 20    Severe concentric LVH (likely hypertensive), mild aortic sclerosis. Grade 1 diastolic dysfunction  . Coronary artery disease 04/2009    50% stenosis in the perforator of LAD; catheterization was for an abnormal Myoview in January 2000 showing anterior and inferolateral ischemia.  [name] PAD (peripheral artery disease) March 2013    Lower extremity Dopplers: R. SFA 50-60%, R. PTA proximally occluded with distal reconstitution;; L. common iliac ~50%, L. SFA 50-70% stenosis, L. PTA < 50%  . Hyperlipidemia   . Chronic kidney disease (CKD), stage II (mild)     Class I-II  . History of schizophrenia     However I am not sure about the validity of this. She is not on any medications.  . Chronic back pain   . 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 At least 3 subsequent echocardiograms have shown improved/normal EF with moderate to severe concentric LVH and diastolic dysfunction with LVOT/intracavitary gradient  . Heart murmur     Previously reported moderate to severe MR. Most recent echo in May 2014 revealed mild MR.  [name] Hypertensive hypertrophic cardiomyopathy: NYHA class II:  Echo: Severe concentric LVH with LV OT gradient; essentially preserved EF with diastolic dysfunction 02/15/2013    Not significantly symptomatic; echocardiogram from May 2014: Severe concentric LVH with EF 50-55%. Somewhat  unbelievably grade 1 diastolic dysfunction with moderate LE dilation. Trivial MR noted.    Past Surgical History  Procedure Laterality Date  . Bunionectomy    . Nm myocar perf wall motion  03/2009    Persantine; EF 51%-both anterior and inferolateral ischemia  . Lower extremity doppler  05/29/2011    right SFA 50% to 59% diameter reduction,right posterior tibal atreery occlusive disease,reconstituting distally, left common illiac<50%,left SFA 50 to70%,left post. tibial <50%  . Doppler echocardiography  May 2014    EF 50-55%; severe concentric LVH; only grade 1 diastolic dysfunction. Mild aortic sclerosis - with LVOT /intracavitary gradient of roughly 20 mmHg mean. Mild to moderately dilated LA;; previously reported MR not seen  . Doppler echocardiography  02/02/2009    mod to severe regurg mitral, EF 20 TO 25%, tricuspid severe regurg. ,pulmonic moderate regurg.  . Cardiac catheterization  04/09/2009    rt and lt cath -non obstructive coronary disease   . Carotid doppler  05/29/2011    left bulb/prox ICA moderate amtfibrous plaque with no evidence significant reduction.,right bulb /proximal ICA normal patency    Prior to Admission medications   Medication Sig Start Date End Date Taking? Authorizing Provider  amLODipine (NORVASC) 10 MG tablet take 1 tablet by mouth once daily 03/13/14  Yes Marykay Lex, MD  aspirin EC 81 MG tablet Take 81 mg by mouth daily.   Yes Historical Provider, MD  atorvastatin (LIPITOR) 40 MG tablet Take 1 tablet (40 mg total) by mouth daily. 06/08/14  Yes Marykay Lex, MD  carisoprodol (SOMA)  350 MG tablet Take 350 mg by mouth 3 (three) times daily.    Yes Historical Provider, MD  carvedilol (COREG) 25 MG tablet Take 1 tablet (25 mg total) by mouth 2 (two) times daily with a meal. 03/13/14  Yes Marykay Lex, MD  cyclobenzaprine (FLEXERIL) 5 MG tablet Take 1 tablet (5 mg total) by mouth 2 (two) times daily as needed for muscle spasms. 08/18/14  Yes Teressa Lower,  NP  DIGITEK 125 MCG tablet take 1 tablet by mouth once daily 03/13/14  Yes Marykay Lex, MD  hydrALAZINE (APRESOLINE) 50 MG tablet take 1 tablet by mouth three times a day 03/13/14  Yes Marykay Lex, MD  HYDROcodone-acetaminophen (NORCO/VICODIN) 5-325 MG per tablet Take 1 tablet by mouth every 4 (four) hours as needed for moderate pain.   Yes Historical Provider, MD  ibuprofen (ADVIL,MOTRIN) 800 MG tablet Take 1 tablet (800 mg total) by mouth 3 (three) times daily. 08/18/14  Yes Teressa Lower, NP  isosorbide mononitrate (IMDUR) 60 MG 24 hr tablet take 1 1/2 tablets by mouth once daily 03/13/14  Yes Marykay Lex, MD  omeprazole (PRILOSEC) 20 MG capsule Take 20 mg by mouth daily.  09/22/13  Yes Historical Provider, MD  spironolactone (ALDACTONE) 25 MG tablet take 1/2 tablet by mouth every morning 03/13/14  Yes Marykay Lex, MD  sucralfate (CARAFATE) 1 G tablet Take 1 g by mouth 4 (four) times daily. 09/25/13  Yes Rolland Porter, MD  methocarbamol (ROBAXIN) 500 MG tablet Take 500 mg by mouth 3 (three) times daily as needed for muscle spasms (back spasm). 08/02/12   Christiane Ha, MD   No Known Allergies  History  Substance Use Topics  . Smoking status: Former Smoker    Quit date: 05/14/2002  . Smokeless tobacco: Never Used  . Alcohol Use: No    Family History  Problem Relation Age of Onset  . Hypertension Mother      Review of Systems  Positive ROS: neg  All other systems have been reviewed and were otherwise negative with the exception of those mentioned in the HPI and as above.  Objective: Vital signs in last 24 hours: Temp:  [97.4 F (36.3 C)] 97.4 F (36.3 C) (07/21 0622) Pulse Rate:  [70] 70 (07/21 0622) Resp:  [18] 18 (07/21 0622) BP: (100)/(43) 100/43 mmHg (07/21 0622) SpO2:  [99 %] 99 % (07/21 0622) Weight:  [233 lb 12.8 oz (106.051 kg)] 233 lb 12.8 oz (106.051 kg) (07/21 0622)  General Appearance: Alert, cooperative, no distress, appears stated age Head:  Normocephalic, without obvious abnormality, atraumatic Eyes: PERRL, conjunctiva/corneas clear, EOM's intact    Neck: Supple, symmetrical, trachea midline Back: Symmetric, no curvature, ROM normal, no CVA tenderness Lungs:  respirations unlabored Heart: Regular rate and rhythm Abdomen: Soft, non-tender Extremities: Extremities normal, atraumatic, no cyanosis or edema Pulses: 2+ and symmetric all extremities Skin: Skin color, texture, turgor normal, no rashes or lesions  NEUROLOGIC:   Mental status: Alert and oriented x4,  no aphasia, good attention span, fund of knowledge, and memory Motor Exam - grossly normal Sensory Exam - grossly normal Reflexes: 1+ Coordination - grossly normal Gait - grossly normal Balance - grossly normal Cranial Nerves: I: smell Not tested  II: visual acuity  OS: nl    OD: nl  II: visual fields Full to confrontation  II: pupils Equal, round, reactive to light  III,VII: ptosis None  III,IV,VI: extraocular muscles  Full ROM  V: mastication Normal  V:  facial light touch sensation  Normal  V,VII: corneal reflex  Present  VII: facial muscle function - upper  Normal  VII: facial muscle function - lower Normal  VIII: hearing Not tested  IX: soft palate elevation  Normal  IX,X: gag reflex Present  XI: trapezius strength  5/5  XI: sternocleidomastoid strength 5/5  XI: neck flexion strength  5/5  XII: tongue strength  Normal    Data Review Lab Results  Component Value Date   WBC 6.1 09/16/2014   HGB 12.3 09/16/2014   HCT 37.1 09/16/2014   MCV 92.8 09/16/2014   PLT 309 09/16/2014   Lab Results  Component Value Date   NA 137 09/16/2014   K 4.1 09/16/2014   CL 105 09/16/2014   CO2 27 09/16/2014   BUN 19 09/16/2014   CREATININE 1.19* 09/16/2014   GLUCOSE 137* 09/16/2014   Lab Results  Component Value Date   INR 1.07 09/16/2014    Assessment/Plan: Patient admitted for PLIF. Patient has failed a reasonable attempt at conservative therapy.  I  explained the condition and procedure to the patient and answered any questions.  Patient wishes to proceed with procedure as planned. Understands risks/ benefits and typical outcomes of procedure.   Glynna Failla S 09/24/2014 6:23 AM

## 2014-09-24 NOTE — Op Note (Signed)
09/24/2014  11:35 AM  PATIENT:  Erin Riley  64 y.o. female  PRE-OPERATIVE DIAGNOSIS:  Spondylolisthesis L4-5 L5-S1, spinal stenosis, synovial cyst, back and leg pain  POST-OPERATIVE DIAGNOSIS:  Same  PROCEDURE:   1. Decompressive lumbar laminectomy L4-5 and L5-S1 on the left with sublaminar decompression and resection of synovial cyst 2. Posterior fixation L4-S1 inclusive using cortical pedicle screws.  3. Intertransverse arthrodesis L4-S1 inclusive bilaterally using morcellized autograft and allograft. 4. Bone marrow aspirate through separate fascial incision from the right iliac crest  SURGEON:  Marikay Alar, MD  ASSISTANTS: None  ANESTHESIA:  General  EBL: 350 ml  Total I/O In: 2000 [I.V.:2000] Out: 800 [Urine:450; Blood:350]  BLOOD ADMINISTERED:none  DRAINS: Hemovac   INDICATION FOR PROCEDURE: This is a patient who presented with severe chronic back pain with some leg pain. MRI and plain films showed a grade 1 spondylolisthesis at L4-5 and L5-S1 with severe facet arthropathy and a synovial cyst at L4-5. She tried medical management for quite a long time without significant relief. Recommended decompression and instrumented fusion L4-5 and L5-S1. My original plan was to perform a PLIF at L4-5 and L5-S1, but upon further review of her imaging intraoperatively we felt that her disc space were too tall and we would not have cages large enough to feel such a disc space. Therefore decided to move forward with decompression and posterior lateral fusion with instrumentation. This also made sense because her disks look quite healthy on the MRI despite the spondylolisthesis. Patient understood the risks, benefits, and alternatives and potential outcomes and wished to proceed.  PROCEDURE DETAILS:  The patient was brought to the operating room. After induction of generalized endotracheal anesthesia the patient was rolled into the prone position on chest rolls and all pressure points were  padded. The patient's lumbar region was cleaned and then prepped with DuraPrep and draped in the usual sterile fashion. Anesthesia was injected and then a dorsal midline incision was made and carried down to the lumbosacral fascia. The fascia was opened and the paraspinous musculature was taken down in a subperiosteal fashion to expose L4-5 and L5-S1. A self-retaining retractor was placed. Intraoperative fluoroscopy confirmed my level, and I started with placement of the L4 and L5 cortical pedicle screws. The pedicle screw entry zones were identified utilizing surface landmarks and  AP and lateral fluoroscopy. I scored the cortex with the high-speed drill and then used the hand drill and EMG monitoring to drill an upward and outward direction into the pedicle. I then tapped line to line, and the tap was also monitored. I then placed a 5-0 eye 35mm cortical pedicle screw into the pedicles of L4 and L5 bilaterally. I then turned my attention to the decompression and I performed a left L4-5 L5-S1 hemilaminectomy, medial facetectomy, and foraminotomies. The yellow ligament was removed to expose the underlying dura and nerve roots, and generous foraminotomies were performed to adequately decompress the neural elements. Both the exiting and traversing nerve roots were decompressed until a coronary dilator passed easily along the nerve roots. I also performed a sublaminar decompression at L4-5 and removed the synovial cyst on the patient's right-hand side. Once the decompression was complete, I dissected between the fascia and the subcutaneous tissues on the right to expose the right iliac crest. The fascia was opened over the right iliac crest and a Jamshidi was used to extract about 30 mL of bone marrow aspirate from the right iliac crest. An control any oozing from the hole in  the iliac crest and closed the fascia with interrupted 0 Vicryls. We then turned our attention to the placement of the lower pedicle screws. The  pedicle screw entry zones were identified utilizing surface landmarks and fluoroscopy. I drilled into each pedicle utilizing the hand drill and EMG monitoring, and tapped each pedicle with the appropriate tap. We palpated with a ball probe to assure no break in the cortex. We then placed 6.5 x 35 mm pedicle screws into the pedicles bilaterally at S1. We then decorticated the transverse processes and the remaining lamina and facet complex and laid a mixture of morcellized autograft and allograft out over these to perform intertransverse arthrodesis at L4-S1 bilaterally. We then placed lordotic rods into the multiaxial screw heads of the pedicle screws and locked these in position with the locking caps and anti-torque device. We then checked our construct with AP and lateral fluoroscopy. Irrigated with copious amounts of bacitracin-containing saline solution. Placed a medium Hemovac drain through separate stab incision. Inspected the nerve roots once again to assure adequate decompression, lined to the dura with Gelfoam, and closed the muscle and the fascia with 0 Vicryl. Closed the subcutaneous tissues with 2-0 Vicryl and subcuticular tissues with 3-0 Vicryl. The skin was closed with benzoin and Steri-Strips. Dressing was then applied, the patient was awakened from general anesthesia and transported to the recovery room in stable condition. At the end of the procedure all sponge, needle and instrument counts were correct.   PLAN OF CARE: Admit to inpatient   PATIENT DISPOSITION:  PACU - hemodynamically stable.   Delay start of Pharmacological VTE agent (>24hrs) due to surgical blood loss or risk of bleeding:  yes

## 2014-09-25 MED ORDER — PROMETHAZINE HCL 25 MG/ML IJ SOLN
12.5000 mg | INTRAMUSCULAR | Status: DC | PRN
  Administered 2014-09-25: 12.5 mg via INTRAVENOUS
  Filled 2014-09-25 (×2): qty 1

## 2014-09-25 MED FILL — Heparin Sodium (Porcine) Inj 1000 Unit/ML: INTRAMUSCULAR | Qty: 30 | Status: AC

## 2014-09-25 MED FILL — Sodium Chloride IV Soln 0.9%: INTRAVENOUS | Qty: 1000 | Status: AC

## 2014-09-25 NOTE — Progress Notes (Signed)
Patient voided 400 cc of dark yellow, clear urine, no odor noted. Patient mobilized with RN, 1 assist with front wheel walker. Patient returned to bed, patient is now speaking with physical therapist in room. Call bell at bedside, within patient's reach. Will continue to monitor.

## 2014-09-25 NOTE — Progress Notes (Signed)
Zofran administered, patient states nausea is unrelieved. MD paged. Phenergan ordered. Phenergan administered, patient reports nausea is relieved.

## 2014-09-25 NOTE — Progress Notes (Signed)
Patient began throwing up while eating lunch. Patient states it is because she was "feeling to hot." Zofran administered,  given plain crackers and ginger ale, area cleaned, air was previously turned down to cool, door opened to vent. Patient states she has not had bowel movement since day prior to admission however is passing gas, bowel sounds active and audile, non tender, not distended. Patient bladder scanned, estimated 345 mL, bladder not distended, patient states she does not feel she has to void, refuses to try voiding. Patient is now in bed watching TV, call bell on patient's bed, and within patient's reach.

## 2014-09-25 NOTE — Progress Notes (Signed)
Pt yelling out.  Angry when I entered room stating "she threw my phone in the floor!"  Her phone was laying on the bed and the call bell was attached to the bed but had fallen down. She continued to be inappropriate and refused to work with PT stating "she had better not bring her fat ass in no more!"

## 2014-09-25 NOTE — Progress Notes (Signed)
Patient refused to ambulate with RN. Patient states she was just working with OT and will not move to edge of bed, chair, or ambulate to bathroom with RN. Will continue to monitor. Call bell on bed within patient's reach.

## 2014-09-25 NOTE — Progress Notes (Signed)
Subjective: Patient reports she's doing ok, back sore, no leg pain or NTW  Objective: Vital signs in last 24 hours: Temp:  [96.4 F (35.8 C)-98.1 F (36.7 C)] 98 F (36.7 C) (07/22 0528) Pulse Rate:  [61-78] 78 (07/22 0528) Resp:  [9-18] 18 (07/22 0528) BP: (112-138)/(60-83) 120/65 mmHg (07/22 0528) SpO2:  [94 %-100 %] 94 % (07/22 0528) Arterial Line BP: (148-160)/(84-97) 160/96 mmHg (07/21 1245)  Intake/Output from previous day: 07/21 0730 - 07/22 0729 In: 2865 [P.O.:265; I.V.:2600] Out: 2625 [Urine:2225; Drains:50; Blood:350] Intake/Output this shift:    Neurologic: Grossly normal  Dressing dry  Lab Results: Lab Results  Component Value Date   WBC 6.1 09/16/2014   HGB 12.3 09/16/2014   HCT 37.1 09/16/2014   MCV 92.8 09/16/2014   PLT 309 09/16/2014   Lab Results  Component Value Date   INR 1.07 09/16/2014   BMET Lab Results  Component Value Date   NA 137 09/16/2014   K 4.1 09/16/2014   CL 105 09/16/2014   CO2 27 09/16/2014   GLUCOSE 137* 09/16/2014   BUN 19 09/16/2014   CREATININE 1.19* 09/16/2014   CALCIUM 9.8 09/16/2014    Studies/Results: Dg Lumbar Spine 2-3 Views  09/24/2014   CLINICAL DATA:  Patient status post L4-S1 spinal fusion.  EXAM: DG C-ARM 61-120 MIN; LUMBAR SPINE - 2-3 VIEW  COMPARISON:  Lumbar spine radiographs 01/27/2014  FINDINGS: Single fluoroscopic intraoperative image demonstrates posterior spinal fusion hardware. No definite evidence for acute abnormality.  IMPRESSION: Single fluoroscopic image demonstrates posterior spinal fusion hardware.   Electronically Signed   By: Annia Belt M.D.   On: 09/24/2014 11:16   Dg C-arm 61-120 Min  09/24/2014   CLINICAL DATA:  Patient status post L4-S1 spinal fusion.  EXAM: DG C-ARM 61-120 MIN; LUMBAR SPINE - 2-3 VIEW  COMPARISON:  Lumbar spine radiographs 01/27/2014  FINDINGS: Single fluoroscopic intraoperative image demonstrates posterior spinal fusion hardware. No definite evidence for acute  abnormality.  IMPRESSION: Single fluoroscopic image demonstrates posterior spinal fusion hardware.   Electronically Signed   By: Annia Belt M.D.   On: 09/24/2014 11:16    Assessment/Plan: Doing well, continue to mobilize   LOS: 1 day    Tamzin Bertling S 09/25/2014, 9:21 AM

## 2014-09-25 NOTE — Progress Notes (Signed)
Utilization review completed. Estell Puccini, RN, BSN. 

## 2014-09-25 NOTE — Evaluation (Signed)
Occupational Therapy Evaluation Patient Details Name: Erin Riley MRN: [id number] DOB: 31-Jul-1950 Today's Date: 09/25/2014    History of Present Illness 64 y.o. female s/p PLIF L4-L5, L5-S1. PMH: HTN, CAD, PAD, CKD, schizophrenia   Clinical Impression   Patient is s/p PLIF L4-L5 L5-S1 surgery resulting in functional limitations due to the deficits listed below (see OT problem list). Education and handout provided on precautions. Pt initially refused to work with therapy, but was agreeable to complete bed mobility and sit EOB for 3 min; continues to refuse to get OOB. Very agitated and yelling at OT and nurses. Patient will benefit from skilled OT acutely to increase independence and safety with ADLs. Recommending SNF at this time due to decreased caregiver support and unsafe to return home alone.     Follow Up Recommendations  Supervision/Assistance - 24 hour;SNF    Equipment Recommendations  Other (comment) (TBD)    Recommendations for Other Services       Precautions / Restrictions Precautions Precautions: Back Precaution Booklet Issued: Yes (comment) Precaution Comments: handout provided  Required Braces or Orthoses: Spinal Brace Spinal Brace: Lumbar corset;Applied in sitting position Restrictions Weight Bearing Restrictions: No      Mobility Bed Mobility Overal bed mobility: Needs Assistance Bed Mobility: Rolling;Sidelying to Sit;Sit to Sidelying Rolling: Min assist (VC for technique) Sidelying to sit: Min assist;HOB elevated     Sit to sidelying: Mod assist (bring feet up to bed) General bed mobility comments: VC for technique; pt sat EOB for 3 min. refused to get OOB.  Transfers   Equipment used: None                  Balance Overall balance assessment: Needs assistance Sitting-balance support: Feet supported;No upper extremity supported                                        ADL Overall ADL's : Needs  assistance/impaired Eating/Feeding: Independent;Sitting   Grooming: Minimal assistance;Standing   Upper Body Bathing: Minimal assitance;Sitting   Lower Body Bathing: Moderate assistance;Sit to/from stand   Upper Body Dressing : Minimal assistance;Sitting   Lower Body Dressing: Moderate assistance;Sit to/from stand   Toilet Transfer: Minimal assistance;BSC;RW   Toileting- Architect and Hygiene: Min guard;Sit to/from stand         General ADL Comments: Pt completed bed mobility but refused to get OOB. Will continue to assess ADL abilities.     Vision Vision Assessment?: No apparent visual deficits   Perception     Praxis      Pertinent Vitals/Pain Pain Assessment: 0-10 Pain Score: 9  Pain Location: back Pain Intervention(s): Limited activity within patient's tolerance;Monitored during session;Repositioned     Hand Dominance Right   Extremity/Trunk Assessment Upper Extremity Assessment Upper Extremity Assessment: Overall WFL for tasks assessed   Lower Extremity Assessment Lower Extremity Assessment: Overall WFL for tasks assessed       Communication Communication Communication: No difficulties   Cognition Arousal/Alertness: Awake/alert Behavior During Therapy: Restless;Agitated Overall Cognitive Status: Within Functional Limits for tasks assessed                     General Comments       Exercises       Shoulder Instructions      Home Living Family/patient expects to be discharged to:: Private residence Living Arrangements: Children Available Help at Discharge: Family;Available PRN/intermittently  Type of Home: Apartment Home Access: Stairs to enter Entrance Stairs-Number of Steps: 3   Home Layout: One level     Bathroom Shower/Tub: Tub/shower unit Shower/tub characteristics: Engineer, building services: Standard     Home Equipment: None          Prior Functioning/Environment Level of Independence: Independent              OT Diagnosis: Generalized weakness;Acute pain   OT Problem List: Decreased strength;Decreased activity tolerance;Impaired balance (sitting and/or standing);Decreased safety awareness;Decreased knowledge of use of DME or AE;Decreased knowledge of precautions;Obesity;Pain   OT Treatment/Interventions: Self-care/ADL training;Therapeutic exercise;DME and/or AE instruction;Therapeutic activities;Patient/family education;Balance training    OT Goals(Current goals can be found in the care plan section) Acute Rehab OT Goals Patient Stated Goal: none stated OT Goal Formulation: With patient ADL Goals Pt Will Perform Grooming: with supervision;standing Pt Will Perform Upper Body Dressing: with modified independence;sitting Pt Will Perform Lower Body Dressing: with min guard assist;with adaptive equipment;sit to/from stand Pt Will Transfer to Toilet: with min guard assist;ambulating;bedside commode Additional ADL Goal #1: Pt will verbalize 3/3 precautions as precursor to ADLs.  OT Frequency: Min 2X/week   Barriers to D/C: Decreased caregiver support  Reports "no one helps me at home"       Co-evaluation              End of Session Nurse Communication: Mobility status;Precautions;Other (comment);Patient requests pain meds (Pt refuse OOB)  Activity Tolerance: Patient limited by pain Patient left: in bed;with call bell/phone within reach;with bed alarm set   Time: 4782-9562 OT Time Calculation (min): 16 min Charges:  OT General Charges $OT Visit: 1 Procedure OT Evaluation $Initial OT Evaluation Tier I: 1 Procedure G-Codes:    Marden Noble 09/25/2014, 10:22 AM

## 2014-09-25 NOTE — Evaluation (Signed)
Physical Therapy Evaluation Patient Details Name: Erin Riley MRN: [id number] DOB: 01/14/1951 Today's Date: 09/25/2014   History of Present Illness  64 y.o. female s/p PLIF L4-L5, L5-S1. PMH: HTN, CAD, PAD, CKD, schizophrenia  Clinical Impression  Pt with increased agitation but willing to ambulate in room and reluctantly agreeable to sitting up in recliner.  Pt needs max cues to recall back precautions as well as cues for safe donning of brace.  PT recommends continued acute services address deficits.  Recommend HHPT for follow up to increase pts functional independence.     Follow Up Recommendations Home health PT;Supervision for mobility/OOB    Equipment Recommendations  Rolling walker with 5" wheels;3in1 (PT)    Recommendations for Other Services       Precautions / Restrictions Precautions Precautions: Back Precaution Booklet Issued: Yes (comment) Precaution Comments: handout provided  Required Braces or Orthoses: Spinal Brace Spinal Brace: Lumbar corset;Applied in sitting position Restrictions Weight Bearing Restrictions: No      Mobility  Bed Mobility               General bed mobility comments: sitting at EOB when PT arrived  Transfers Overall transfer level: Needs assistance Equipment used: Rolling walker (2 wheeled) Transfers: Sit to/from Stand Sit to Stand: Supervision         General transfer comment: Mod cues for hand placement and safety when sitting/standing.   Ambulation/Gait Ambulation/Gait assistance: Supervision Ambulation Distance (Feet): 18 Feet Assistive device: Rolling walker (2 wheeled) Gait Pattern/deviations: Step-through pattern;Decreased stride length;Trunk flexed     General Gait Details: Pt only agreeable to ambulation around room, despite max encouragement.  Min cues for posture and safety with RW.   Stairs            Wheelchair Mobility    Modified Rankin (Stroke Patients Only)       Balance Overall balance  assessment: Needs assistance Sitting-balance support: Feet supported;No upper extremity supported Sitting balance-Leahy Scale: Good     Standing balance support: During functional activity;Bilateral upper extremity supported Standing balance-Leahy Scale: Poor Standing balance comment: Requires use of RW to maintain balance.                              Pertinent Vitals/Pain Pain Assessment: 0-10 Pain Score: 8  Pain Location: back Pain Descriptors / Indicators: Aching Pain Intervention(s): Limited activity within patient's tolerance;Monitored during session;Repositioned    Home Living Family/patient expects to be discharged to:: Private residence Living Arrangements: Children Available Help at Discharge: Family;Available PRN/intermittently Type of Home: Apartment Home Access: Stairs to enter   Entrance Stairs-Number of Steps: 3 Home Layout: One level Home Equipment: None      Prior Function Level of Independence: Independent               Hand Dominance   Dominant Hand: Right    Extremity/Trunk Assessment   Upper Extremity Assessment: Overall WFL for tasks assessed           Lower Extremity Assessment: Overall WFL for tasks assessed         Communication   Communication: No difficulties  Cognition Arousal/Alertness: Awake/alert Behavior During Therapy: Restless;Agitated Overall Cognitive Status: Within Functional Limits for tasks assessed                      General Comments      Exercises        Assessment/Plan  PT Assessment Patient needs continued PT services  PT Diagnosis Difficulty walking;Generalized weakness;Acute pain   PT Problem List Decreased strength;Decreased activity tolerance;Decreased balance;Decreased mobility;Decreased knowledge of use of DME;Decreased safety awareness;Decreased knowledge of precautions;Pain  PT Treatment Interventions DME instruction;Gait training;Stair training;Functional mobility  training;Therapeutic activities;Therapeutic exercise;Balance training;Patient/family education   PT Goals (Current goals can be found in the Care Plan section) Acute Rehab PT Goals Patient Stated Goal: none stated PT Goal Formulation: With patient Time For Goal Achievement: 10/02/14 Potential to Achieve Goals: Good    Frequency Min 5X/week   Barriers to discharge        Co-evaluation               End of Session Equipment Utilized During Treatment: Back brace Activity Tolerance: No increased pain;Treatment limited secondary to agitation Patient left: in chair;with call bell/phone within reach Nurse Communication: Mobility status         Time: 1191-4782 PT Time Calculation (min) (ACUTE ONLY): 14 min   Charges:   PT Evaluation $Initial PT Evaluation Tier I: 1 Procedure     PT G CodesVista Deck 09/25/2014, 2:26 PM

## 2014-09-26 MED ORDER — BISACODYL 10 MG RE SUPP
10.0000 mg | Freq: Every day | RECTAL | Status: DC | PRN
  Administered 2014-09-26: 10 mg via RECTAL
  Filled 2014-09-26: qty 1

## 2014-09-26 MED ORDER — POLYETHYLENE GLYCOL 3350 17 G PO PACK
17.0000 g | PACK | Freq: Every day | ORAL | Status: DC
  Administered 2014-09-26 – 2014-09-28 (×3): 17 g via ORAL
  Filled 2014-09-26 (×3): qty 1

## 2014-09-26 NOTE — Progress Notes (Signed)
Patient complaining of indigestion, Maalox administered. Patient states she has not been passing gas since yesterday, miralax administered, suppository administered. Bowel sounds faint, abdomen tender. Will continue to monitor.

## 2014-09-26 NOTE — Progress Notes (Signed)
Physical Therapy Treatment Patient Details Name: Erin Riley MRN: [id number] DOB: 1950/07/10 Today's Date: 09/26/2014    History of Present Illness 64 y.o. female s/p PLIF L4-L5, L5-S1. PMH: HTN, CAD, PAD, CKD, schizophrenia    PT Comments    Patient making gains with mobility and gait.  Able to ambulate 110' with RW.  Patient reports her son will be with her 24 hours.  Recommend HHPT to continue therapy and education/precautions.  Follow Up Recommendations  Home health PT;Supervision for mobility/OOB (Patient reports son will be available 24 hours/day)     Equipment Recommendations  Rolling walker with 5" wheels;3in1 (PT)    Recommendations for Other Services       Precautions / Restrictions Precautions Precautions: Back Precaution Comments: Reviewed precautions with patient. Required Braces or Orthoses: Spinal Brace Spinal Brace: Lumbar corset;Applied in sitting position Restrictions Weight Bearing Restrictions: No    Mobility  Bed Mobility Overal bed mobility: Needs Assistance Bed Mobility: Rolling;Sidelying to Sit;Sit to Sidelying Rolling: Supervision Sidelying to sit: Min guard     Sit to sidelying: Min assist General bed mobility comments: Verbal cues for correct technique.  Reviewed step-by-step to maintain precautions.  Patient required increased time for mobility. Min assist to don back brace in sitting.  Assist to bring LE's onto bed to return to sidelying.    Transfers Overall transfer level: Needs assistance Equipment used: Rolling walker (2 wheeled) Transfers: Sit to/from Stand Sit to Stand: Supervision         General transfer comment: Patient using correct hand placement from bed and toilet.  Ambulation/Gait Ambulation/Gait assistance: Supervision Ambulation Distance (Feet): 110 Feet Assistive device: Rolling walker (2 wheeled) Gait Pattern/deviations: Step-through pattern;Decreased stride length;Trunk flexed Gait velocity: Decreased Gait  velocity interpretation: Below normal speed for age/gender General Gait Details: Verbal cues to stand upright and look forward during gait.  Increased distance today.   Stairs            Wheelchair Mobility    Modified Rankin (Stroke Patients Only)       Balance                                    Cognition Arousal/Alertness: Awake/alert Behavior During Therapy: Agitated (Frustrated at having to get OOB again) Overall Cognitive Status: Within Functional Limits for tasks assessed                      Exercises      General Comments        Pertinent Vitals/Pain Pain Assessment: 0-10 Pain Score: 8  Pain Location: Back Pain Descriptors / Indicators: Aching;Sore Pain Intervention(s): Monitored during session;Repositioned;Patient requesting pain meds-RN notified    Home Living                      Prior Function            PT Goals (current goals can now be found in the care plan section) Progress towards PT goals: Progressing toward goals    Frequency  Min 5X/week    PT Plan Current plan remains appropriate    Co-evaluation             End of Session Equipment Utilized During Treatment: Back brace Activity Tolerance: Patient tolerated treatment well (Encouragement to participate) Patient left: in bed;with call bell/phone within reach;with bed alarm set;with family/visitor present     Time:  1610-9604 PT Time Calculation (min) (ACUTE ONLY): 20 min  Charges:  $Gait Training: 8-22 mins $Therapeutic Activity: 8-22 mins                    G Codes:      Vena Austria October 08, 2014, 7:06 PM Durenda Hurt. Renaldo Fiddler, [facility] Acute Rehab Services Pager [phone]

## 2014-09-26 NOTE — Progress Notes (Signed)
Occupational Therapy Treatment Patient Details Name: Erin Riley MRN: [id number] DOB: 08-31-1950 Today's Date: 09/26/2014    History of present illness 64 y.o. female s/p PLIF L4-L5, L5-S1. PMH: HTN, CAD, PAD, CKD, schizophrenia   OT comments  Pt demonstrates ability to complete adls but does not complete them using correct back precautions. OT educated throughout session with handout , demo and verbal cues. Pt continues to complete task at baseline method. Pt unable to recall precautions at this time. Recommending SNF due to safety concerns. Pt is able to sit eob don brace and cross bil LE to don socks. Pt very impulsive and needs max cues for back precautions/ safety.    Follow Up Recommendations  Supervision/Assistance - 24 hour;SNF    Equipment Recommendations  Other (comment) (defer)    Recommendations for Other Services      Precautions / Restrictions Precautions Precautions: Back Precaution Comments: handout present at bedside Required Braces or Orthoses: Spinal Brace Spinal Brace: Lumbar corset;Applied in sitting position       Mobility Bed Mobility Overal bed mobility: Needs Assistance Bed Mobility: Rolling;Supine to Sit;Sit to Supine Rolling: Supervision Sidelying to sit: Min assist Supine to sit: Min guard Sit to supine: Min guard   General bed mobility comments: cues for safety and use of bed rail required. Pt return sit<>supine with correct form  Transfers Overall transfer level: Needs assistance Equipment used: Rolling walker (2 wheeled) Transfers: Sit to/from Stand Sit to Stand: Supervision         General transfer comment: cues for safety and correct hand placement    Balance Overall balance assessment: Needs assistance Sitting-balance support: No upper extremity supported;Feet supported Sitting balance-Leahy Scale: Good     Standing balance support: Single extremity supported;During functional activity Standing balance-Leahy Scale: Fair                      ADL Overall ADL's : Needs assistance/impaired     Grooming: Wash/dry hands;Wash/dry face;Oral care;Applying deodorant;Min guard Grooming Details (indicate cue type and reason): cues for safety with back precautions Upper Body Bathing: Supervision/ safety;Sitting Upper Body Bathing Details (indicate cue type and reason): cues for back precautions Lower Body Bathing: Min guard;Sit to/from stand Lower Body Bathing Details (indicate cue type and reason): pt standing and cued for safety with back precautions. pt educated with demo of squat for peri care. pt continues to bend. pt encouraged to sit to avoid bending but pt continued to bend with max cueing.   Upper Body Dressing Details (indicate cue type and reason): declined blue gown stating it will make me itch. OT ordering cotton gown via front desk and awaiting its arrival to floor Tech / RN notified. pt required mod (A) for don of brace Lower Body Dressing: Min guard;Sit to/from stand Lower Body Dressing Details (indicate cue type and reason): pt able to cross bil LE and don socks at EOB Toilet Transfer: Min guard;RW;Grab Paramedic Details (indicate cue type and reason): cues for safety and to maintain precautions Toileting- Clothing Manipulation and Hygiene: Min guard;Sit to/from stand Toileting - Clothing Manipulation Details (indicate cue type and reason): pt again educated on how to perform peri care with no return demo of edcuation. pt insist on bending with max cues for safety from OT     Functional mobility during ADLs: Min guard;Rolling walker General ADL Comments: pt agreeable to OOB to get clean linen placed on bed and brush teeth. Pt with poor return demo to  all precautions throughout session and no attempt to return demo correct form despite max cues. pt states "okay " Pt pushing RW ahead of patient at unsafe distance      Vision                     Perception      Praxis      Cognition   Behavior During Therapy: Restless;Impulsive Overall Cognitive Status: No family/caregiver present to determine baseline cognitive functioning                       Extremity/Trunk Assessment               Exercises     Shoulder Instructions       General Comments      Pertinent Vitals/ Pain       Pain Assessment: No/denies pain  Home Living                                          Prior Functioning/Environment              Frequency Min 2X/week     Progress Toward Goals  OT Goals(current goals can now be found in the care plan section)  Progress towards OT goals: Progressing toward goals  Acute Rehab OT Goals Patient Stated Goal: none stated OT Goal Formulation: With patient ADL Goals Pt Will Perform Grooming: with supervision;standing Pt Will Perform Upper Body Dressing: with modified independence;sitting Pt Will Perform Lower Body Dressing: with min guard assist;with adaptive equipment;sit to/from stand Pt Will Transfer to Toilet: with min guard assist;ambulating;bedside commode Additional ADL Goal #1: Pt will verbalize 3/3 precautions as precursor to ADLs.  Plan Discharge plan remains appropriate    Co-evaluation                 End of Session Equipment Utilized During Treatment: Rolling walker;Back brace   Activity Tolerance Patient tolerated treatment well   Patient Left in bed;with call bell/phone within reach;with bed alarm set;Other (comment) (RN entering room with medication OT requested for heart burn)   Nurse Communication Mobility status;Precautions        Time: [phone] OT Time Calculation (min): 26 min  Charges: OT General Charges $OT Visit: 1 Procedure OT Treatments $Self Care/Home Management : 23-37 mins  Harolyn Rutherford 09/26/2014, 9:52 AM  Pager: [phone]

## 2014-09-26 NOTE — Progress Notes (Signed)
Patient ID: Erin Riley, female   DOB: 1951/01/18, 64 y.o.   MRN: [id number] BP 105/64 mmHg  Pulse 63  Temp(Src) 98.1 F (36.7 C) (Oral)  Resp 20  Wt 106.051 kg (233 lb 12.8 oz)  SpO2 95%  LMP 12/11/2011 Alert and oriented x 4, speech is clear, and fluent Moving lower extremities well Wound dressing is dry Continue with pt

## 2014-09-27 NOTE — Progress Notes (Signed)
Patient ID: Erin Riley, female   DOB: 1950-03-21, 64 y.o.   MRN: [id number] Afeb, vss No new neuro issues Says she needs at least another day in the hospital. Will increase activity slowly.

## 2014-09-27 NOTE — Progress Notes (Signed)
Physical Therapy Treatment Patient Details Name: Erin Riley MRN: [id number] DOB: [birth year] Today's Date: 09/27/2014    History of Present Illness 64 y.o. female s/p PLIF L4-L5, L5-S1. PMH: HTN, CAD, PAD, CKD, schizophrenia    PT Comments    Patient reluctant to participate in therapy today. Agreeable to ambulating within room and to bathroom but declines ambulation in hallway, "it is too early for that." Explained importance of mobility however pt is self limiting. Pt needs to perform stair training prior to d/c. Requires max encouragement. Will follow acutely.    Follow Up Recommendations  Home health PT;Supervision for mobility/OOB     Equipment Recommendations  Rolling walker with 5" wheels;3in1 (PT)    Recommendations for Other Services       Precautions / Restrictions Precautions Precautions: Back Precaution Booklet Issued: Yes (comment) Precaution Comments: Reviewed precautions with patient. Required Braces or Orthoses: Spinal Brace Spinal Brace: Lumbar corset;Applied in sitting position Restrictions Weight Bearing Restrictions: No    Mobility  Bed Mobility Overal bed mobility: Needs Assistance Bed Mobility: Rolling;Sidelying to Sit;Sit to Supine Rolling: Supervision Sidelying to sit: Supervision;HOB elevated   Sit to supine: Supervision   General bed mobility comments: VC's for correct technique as pt impulsive with mobility. No physical assist required. Refused to get into bed with HOB flat, "bring that rail up"  Transfers Overall transfer level: Needs assistance Equipment used: Rolling walker (2 wheeled) Transfers: Sit to/from Stand Sit to Stand: Supervision         General transfer comment: Supervision for safety as pt impulsive. "grab me that thing" (referring to RW). Stood from EOB, x1 from toilet x1.  Ambulation/Gait Ambulation/Gait assistance: Min guard Ambulation Distance (Feet): 20 Feet (x2 bouts) Assistive device: Rolling walker (2  wheeled) Gait Pattern/deviations: Step-through pattern;Decreased stride length;Trunk flexed Gait velocity: Decreased Gait velocity interpretation: Below normal speed for age/gender General Gait Details: Verbal cues to stand upright and look forward during gait.  Declined ambulation out of room today, "it's too early, I am not doing it until that older woman comes back; if she is off, so am I."    Stairs            Wheelchair Mobility    Modified Rankin (Stroke Patients Only)       Balance Overall balance assessment: Needs assistance Sitting-balance support: Feet supported;No upper extremity supported Sitting balance-Leahy Scale: Good     Standing balance support: During functional activity Standing balance-Leahy Scale: Fair                      Cognition Arousal/Alertness: Awake/alert Behavior During Therapy: Agitated Overall Cognitive Status: Within Functional Limits for tasks assessed                      Exercises      General Comments        Pertinent Vitals/Pain Pain Assessment: Faces Faces Pain Scale: Hurts a little bit Pain Location: "sore in my back" Pain Descriptors / Indicators: Sore Pain Intervention(s): Limited activity within patient's tolerance;Monitored during session;Repositioned    Home Living                      Prior Function            PT Goals (current goals can now be found in the care plan section) Progress towards PT goals: Not progressing toward goals - comment (self limiting)    Frequency  Min 5X/week  PT Plan Current plan remains appropriate    Co-evaluation             End of Session Equipment Utilized During Treatment: Back brace Activity Tolerance: Patient tolerated treatment well Patient left: in bed;with call bell/phone within reach;with bed alarm set     Time: 1610-9604 PT Time Calculation (min) (ACUTE ONLY): 11 min  Charges:  $Therapeutic Activity: 8-22 mins                     G Codes:      Kavontae Pritchard A Callan Yontz 09/27/2014, 10:37 AM Mylo Red, PT, DPT [phone]

## 2014-09-28 MED ORDER — METHOCARBAMOL 500 MG PO TABS: 500.0000 mg | ORAL_TABLET | Freq: Three times a day (TID) | ORAL | Status: DC | PRN

## 2014-09-28 MED ORDER — OXYCODONE-ACETAMINOPHEN 5-325 MG PO TABS: 1.0000 | ORAL_TABLET | Freq: Four times a day (QID) | ORAL | Status: DC | PRN

## 2014-09-28 NOTE — Progress Notes (Signed)
Physical Therapy Treatment Patient Details Name: Erin Riley MRN: [id number] DOB: February 10, 1951 Today's Date: Oct 09, 2014    History of Present Illness 64 y.o. female s/p PLIF L4-L5, L5-S1. PMH: HTN, CAD, PAD, CKD, schizophrenia    PT Comments    Upon entering room pt said she only wanted to walk with a woman. After re-directing the conversation was able to get pt up and start to ambulate. Pt can be impulsive. Pt stated during ambulation that she felt nauseated and wanted to go back to room. Pt was unable to remember any back precautions. Re-educated pt. Continue with POC.  Follow Up Recommendations  Home health PT;Supervision for mobility/OOB     Equipment Recommendations  Rolling walker with 5" wheels;3in1 (PT)    Recommendations for Other Services       Precautions / Restrictions Precautions Precautions: Back Precaution Comments: Reviewed precautions with patient. Required Braces or Orthoses: Spinal Brace Restrictions Weight Bearing Restrictions: No    Mobility  Bed Mobility               General bed mobility comments: Pt found sitting on EOB.  Transfers Overall transfer level: Needs assistance Equipment used: Rolling walker (2 wheeled) Transfers: Sit to/from Stand Sit to Stand: Supervision         General transfer comment: Supervision for safety.  Ambulation/Gait Ambulation/Gait assistance: Min guard Ambulation Distance (Feet): 100 Feet Assistive device: Rolling walker (2 wheeled) Gait Pattern/deviations: Step-through pattern;Trunk flexed   Gait velocity interpretation: Below normal speed for age/gender General Gait Details: VC's for upright posture stated she could walk farther but was feeling nauseated.   Stairs            Wheelchair Mobility    Modified Rankin (Stroke Patients Only)       Balance                                    Cognition Arousal/Alertness: Awake/alert Behavior During Therapy: Impulsive Overall  Cognitive Status: Within Functional Limits for tasks assessed                      Exercises      General Comments        Pertinent Vitals/Pain Pain Assessment: No/denies pain Faces Pain Scale: Hurts a little bit Pain Location: Back. Pain Descriptors / Indicators: Sore Pain Intervention(s): Monitored during session;Repositioned    Home Living                      Prior Function            PT Goals (current goals can now be found in the care plan section) Progress towards PT goals: Progressing toward goals    Frequency  Min 5X/week    PT Plan Current plan remains appropriate    Co-evaluation             End of Session Equipment Utilized During Treatment: Back brace Activity Tolerance: Patient tolerated treatment well Patient left: in chair;with call bell/phone within reach;with chair alarm set     Time: 8101-7510 PT Time Calculation (min) (ACUTE ONLY): 14 min  Charges:  $Gait Training: 8-22 mins                    G Codes:      Nita Sells, SPTA 2014/10/09, 10:02 AM

## 2014-09-28 NOTE — Discharge Summary (Signed)
Physician Discharge Summary  Patient ID: Erin Riley MRN: [id number] DOB/AGE: [birth year] 64 y.o.  Admit date: 09/24/2014 Discharge date: 09/28/2014  Admission Diagnoses: lumbar stenosis/ instability   Discharge Diagnoses: same   Discharged Condition: stable  Hospital Course: The patient was admitted on 09/24/2014 and taken to the operating room where the patient underwent lumbar fusion. The patient tolerated the procedure well and was taken to the recovery room and then to the floor in stable condition. The hospital course was routine. There were no complications. The wound remained clean dry and intact. Pt had appropriate back soreness. No complaints of leg pain or new N/T/W. The patient remained afebrile with stable vital signs, and tolerated a regular diet. The patient continued to increase activities, and pain was well controlled with oral pain medications.   Consults: None  Significant Diagnostic Studies:  Results for orders placed or performed during the hospital encounter of 09/16/14  Surgical pcr screen  Result Value Ref Range   MRSA, PCR NEGATIVE NEGATIVE   Staphylococcus aureus NEGATIVE NEGATIVE  Basic metabolic panel  Result Value Ref Range   Sodium 137 135 - 145 mmol/L   Potassium 4.1 3.5 - 5.1 mmol/L   Chloride 105 101 - 111 mmol/L   CO2 27 22 - 32 mmol/L   Glucose, Bld 137 (H) 65 - 99 mg/dL   BUN 19 6 - 20 mg/dL   Creatinine, Ser 4.09 (H) 0.44 - 1.00 mg/dL   Calcium 9.8 8.9 - 81.1 mg/dL   GFR calc non Af Amer 48 (L) >60 mL/min   GFR calc Af Amer 55 (L) >60 mL/min   Anion gap 5 5 - 15  CBC WITH DIFFERENTIAL  Result Value Ref Range   WBC 6.1 4.0 - 10.5 K/uL   RBC 4.00 3.87 - 5.11 MIL/uL   Hemoglobin 12.3 12.0 - 15.0 g/dL   HCT 91.4 78.2 - 95.6 %   MCV 92.8 78.0 - 100.0 fL   MCH 30.8 26.0 - 34.0 pg   MCHC 33.2 30.0 - 36.0 g/dL   RDW 21.3 08.6 - 57.8 %   Platelets 309 150 - 400 K/uL   Neutrophils Relative % 61 43 - 77 %   Neutro Abs 3.7 1.7 - 7.7 K/uL   Lymphocytes Relative 31 12 - 46 %   Lymphs Abs 1.9 0.7 - 4.0 K/uL   Monocytes Relative 6 3 - 12 %   Monocytes Absolute 0.4 0.1 - 1.0 K/uL   Eosinophils Relative 2 0 - 5 %   Eosinophils Absolute 0.1 0.0 - 0.7 K/uL   Basophils Relative 0 0 - 1 %   Basophils Absolute 0.0 0.0 - 0.1 K/uL  Protime-INR  Result Value Ref Range   Prothrombin Time 14.1 11.6 - 15.2 seconds   INR 1.07 0.00 - 1.49  Type and screen  Result Value Ref Range   ABO/RH(D) B POS    Antibody Screen NEG    Sample Expiration 09/30/2014   ABO/Rh  Result Value Ref Range   ABO/RH(D) B POS     Dg Lumbar Spine 2-3 Views  09/24/2014   CLINICAL DATA:  Patient status post L4-S1 spinal fusion.  EXAM: DG C-ARM 61-120 MIN; LUMBAR SPINE - 2-3 VIEW  COMPARISON:  Lumbar spine radiographs 01/27/2014  FINDINGS: Single fluoroscopic intraoperative image demonstrates posterior spinal fusion hardware. No definite evidence for acute abnormality.  IMPRESSION: Single fluoroscopic image demonstrates posterior spinal fusion hardware.   Electronically Signed   By: Annia Belt M.D.   On:  09/24/2014 11:16   Dg C-arm 61-120 Min  09/24/2014   CLINICAL DATA:  Patient status post L4-S1 spinal fusion.  EXAM: DG C-ARM 61-120 MIN; LUMBAR SPINE - 2-3 VIEW  COMPARISON:  Lumbar spine radiographs 01/27/2014  FINDINGS: Single fluoroscopic intraoperative image demonstrates posterior spinal fusion hardware. No definite evidence for acute abnormality.  IMPRESSION: Single fluoroscopic image demonstrates posterior spinal fusion hardware.   Electronically Signed   By: Annia Belt M.D.   On: 09/24/2014 11:16    Antibiotics:  Anti-infectives    Start     Dose/Rate Route Frequency Ordered Stop   09/24/14 1500  ceFAZolin (ANCEF) IVPB 1 g/50 mL premix     1 g 100 mL/hr over 30 Minutes Intravenous Every 8 hours 09/24/14 1351 09/24/14 2334   09/24/14 0736  bacitracin 50,000 Units in sodium chloride irrigation 0.9 % 500 mL irrigation  Status:  Discontinued       As needed  09/24/14 0738 09/24/14 1134   09/24/14 0700  ceFAZolin (ANCEF) IVPB 2 g/50 mL premix     2 g 100 mL/hr over 30 Minutes Intravenous To [facility] 09/23/14 1225 09/24/14 0755      Discharge Exam: Blood pressure 159/53, pulse 77, temperature 97.8 F (36.6 C), temperature source Oral, resp. rate 17, weight 233 lb 12.8 oz (106.051 kg), last menstrual period 12/11/2011, SpO2 96 %. Neurologic: Grossly normal Incision CDI  Discharge Medications:     Medication List    STOP taking these medications        carisoprodol 350 MG tablet  Commonly known as:  SOMA     cyclobenzaprine 5 MG tablet  Commonly known as:  FLEXERIL     HYDROcodone-acetaminophen 5-325 MG per tablet  Commonly known as:  NORCO/VICODIN     ibuprofen 800 MG tablet  Commonly known as:  ADVIL,MOTRIN      TAKE these medications        amLODipine 10 MG tablet  Commonly known as:  NORVASC  take 1 tablet by mouth once daily     aspirin EC 81 MG tablet  Take 81 mg by mouth daily.     atorvastatin 40 MG tablet  Commonly known as:  LIPITOR  Take 1 tablet (40 mg total) by mouth daily.     carvedilol 25 MG tablet  Commonly known as:  COREG  Take 1 tablet (25 mg total) by mouth 2 (two) times daily with a meal.     DIGITEK 0.125 MG tablet  Generic drug:  digoxin  take 1 tablet by mouth once daily     hydrALAZINE 50 MG tablet  Commonly known as:  APRESOLINE  take 1 tablet by mouth three times a day     isosorbide mononitrate 60 MG 24 hr tablet  Commonly known as:  IMDUR  take 1 1/2 tablets by mouth once daily     methocarbamol 500 MG tablet  Commonly known as:  ROBAXIN  Take 1 tablet (500 mg total) by mouth 3 (three) times daily as needed for muscle spasms (back spasm).     omeprazole 20 MG capsule  Commonly known as:  PRILOSEC  Take 20 mg by mouth daily.     oxyCODONE-acetaminophen 5-325 MG per tablet  Commonly known as:  PERCOCET/ROXICET  Take 1-2 tablets by mouth every 6 (six) hours as needed  for moderate pain.     spironolactone 25 MG tablet  Commonly known as:  ALDACTONE  take 1/2 tablet by mouth every morning  sucralfate 1 G tablet  Commonly known as:  CARAFATE  Take 1 g by mouth 4 (four) times daily.        Disposition: home with [facility] PT   Final Dx: lumbar fusion      Discharge Instructions    Call MD for:  difficulty breathing, headache or visual disturbances    Complete by:  As directed      Call MD for:  persistant nausea and vomiting    Complete by:  As directed      Call MD for:  redness, tenderness, or signs of infection (pain, swelling, redness, odor or green/yellow discharge around incision site)    Complete by:  As directed      Call MD for:  severe uncontrolled pain    Complete by:  As directed      Call MD for:  temperature >100.4    Complete by:  As directed      Diet - low sodium heart healthy    Complete by:  As directed      Discharge instructions    Complete by:  As directed   No driving, no lifting, no bending. Mat shower     Increase activity slowly    Complete by:  As directed      Remove dressing in 48 hours    Complete by:  As directed            Follow-up Information    Follow up with Marquett Bertoli S, MD. Schedule an appointment as soon as possible for a visit in 2 weeks.   Specialty:  Neurosurgery   Contact information:   [address] [place] [address] [phone]        Signed: Tia Alert 09/28/2014, 1:04 PM

## 2014-09-28 NOTE — Care Management Important Message (Signed)
Important Message  Patient Details  Name: Erin Riley MRN: [id number] Date of Birth: 11/06/1950   Medicare Important Message Given:  Yes-second notification given    Yvonna Alanis 09/28/2014, 2:41 PM

## 2014-09-28 NOTE — Progress Notes (Signed)
Occupational Therapy Treatment Patient Details Name: Erin Riley MRN: [id number] DOB: 1951-01-05 Today's Date: 09/28/2014    History of present illness 64 y.o. female s/p PLIF L4-L5, L5-S1. PMH: HTN, CAD, PAD, CKD, schizophrenia   OT comments  Pt continues to break back precautions with poor return demo. Pt with no carry over from previous sessions. Pt needed cues to don brace and to complete bed mobility. Pt is at risk for falls and injury due to poor recall of precautions with adls. Recommending SNF for (A) for adl task. Pt completes transfers at Supervision level but can not carry out an adl task without breaking precautions.   Follow Up Recommendations  Supervision/Assistance - 24 hour;SNF    Equipment Recommendations  Other (comment)    Recommendations for Other Services      Precautions / Restrictions Precautions Precautions: Back Precaution Comments: Reviewed precautions with patient. Required Braces or Orthoses: Spinal Brace Spinal Brace: Lumbar corset;Applied in sitting position Restrictions Weight Bearing Restrictions: No       Mobility Bed Mobility Overal bed mobility: Needs Assistance Bed Mobility: Supine to Sit;Sit to Supine Rolling: Supervision   Supine to sit: Min guard Sit to supine: Min guard   General bed mobility comments: cues for safety and to avoid twisting  Transfers Overall transfer level: Needs assistance Equipment used: Rolling walker (2 wheeled) Transfers: Sit to/from Stand Sit to Stand: Supervision         General transfer comment: pt pulling up on RW    Balance                                   ADL Overall ADL's : Needs assistance/impaired     Grooming: Wash/dry hands;Min guard;Standing Grooming Details (indicate cue type and reason): cues to complete task         Upper Body Dressing : Supervision/safety;Sitting Upper Body Dressing Details (indicate cue type and reason): cued to don brace but able to apply  brace     Toilet Transfer: Min guard;Ambulation;Regular Toilet;Grab bars;RW Statistician Details (indicate cue type and reason): cues for safety Toileting- Clothing Manipulation and Hygiene: Min guard;Sit to/from stand Toileting - Clothing Manipulation Details (indicate cue type and reason): cues for safety and avoid bending     Functional mobility during ADLs: Min guard;Rolling walker General ADL Comments: pt with poor return demo of back precautions with adls. pt refusing medication from RN and asking OT if she is the nurse. OT visiting patient now 3 times and pt does not recognize therapist or purpose of therapist arrival. RN telling patient that she is the nurse. Pt states "no I want my nurse." Pt asking to compare medications in purse with medication being provided by RN at [facility] stime.       Vision                     Perception     Praxis      Cognition   Behavior During Therapy: Impulsive Overall Cognitive Status: No family/caregiver present to determine baseline cognitive functioning Area of Impairment: Safety/judgement     Memory: Decreased short-term memory    Safety/Judgement: Decreased awareness of deficits     General Comments: Pt with no recall of precautions and lack of awareness to safety with brace and RW    Extremity/Trunk Assessment               Exercises  Shoulder Instructions       General Comments      Pertinent Vitals/ Pain       Pain Assessment: No/denies pain Faces Pain Scale: Hurts a little bit Pain Location: Back. Pain Descriptors / Indicators: Sore Pain Intervention(s): Monitored during session;Repositioned  Home Living                                          Prior Functioning/Environment              Frequency Min 2X/week     Progress Toward Goals  OT Goals(current goals can now be found in the care plan section)  Progress towards OT goals: Progressing toward goals (but no return  demo of back precautions)  Acute Rehab OT Goals Patient Stated Goal: none stated OT Goal Formulation: With patient ADL Goals Pt Will Perform Grooming: with supervision;standing Pt Will Perform Upper Body Dressing: with modified independence;sitting Pt Will Perform Lower Body Dressing: with min guard assist;with adaptive equipment;sit to/from stand Pt Will Transfer to Toilet: with min guard assist;ambulating;bedside commode Additional ADL Goal #1: Pt will verbalize 3/3 precautions as precursor to ADLs.  Plan Discharge plan remains appropriate    Co-evaluation                 End of Session Equipment Utilized During Treatment: Rolling walker;Back brace   Activity Tolerance Patient tolerated treatment well   Patient Left in bed;with call bell/phone within reach;with bed alarm set;Other (comment)   Nurse Communication Mobility status;Precautions        Time: 8657-8469 OT Time Calculation (min): 9 min  Charges: OT General Charges $OT Visit: 1 Procedure OT Treatments $Self Care/Home Management : 8-22 mins  Harolyn Rutherford 09/28/2014, 1:37 PM Pager: [phone]

## 2014-09-28 NOTE — Progress Notes (Signed)
Patient is discharged from room 4N21 at this time. Alert and in stable condition. IV site d/c'd and dressing changed. Instructions read to patient and understanding verbalized. Left unit via wheelchair with all belongings at side.

## 2014-11-24 ENCOUNTER — Encounter (HOSPITAL_COMMUNITY): Payer: Self-pay | Admitting: Emergency Medicine

## 2014-11-24 ENCOUNTER — Emergency Department (HOSPITAL_COMMUNITY)
Admission: EM | Admit: 2014-11-24 | Discharge: 2014-11-25 | Disposition: A | Discharge status: Home / Self Care | Payer: Medicare Other | Attending: Emergency Medicine | Admitting: Emergency Medicine

## 2014-11-24 DIAGNOSIS — G8929 Other chronic pain: Secondary | ICD-10-CM | POA: Diagnosis not present

## 2014-11-24 DIAGNOSIS — Z8659 Personal history of other mental and behavioral disorders: Secondary | ICD-10-CM | POA: Insufficient documentation

## 2014-11-24 DIAGNOSIS — I2510 Atherosclerotic heart disease of native coronary artery without angina pectoris: Secondary | ICD-10-CM | POA: Insufficient documentation

## 2014-11-24 DIAGNOSIS — I422 Other hypertrophic cardiomyopathy: Secondary | ICD-10-CM | POA: Diagnosis not present

## 2014-11-24 DIAGNOSIS — Z79899 Other long term (current) drug therapy: Secondary | ICD-10-CM | POA: Diagnosis not present

## 2014-11-24 DIAGNOSIS — E785 Hyperlipidemia, unspecified: Secondary | ICD-10-CM | POA: Insufficient documentation

## 2014-11-24 DIAGNOSIS — N182 Chronic kidney disease, stage 2 (mild): Secondary | ICD-10-CM | POA: Insufficient documentation

## 2014-11-24 DIAGNOSIS — I739 Peripheral vascular disease, unspecified: Secondary | ICD-10-CM | POA: Diagnosis not present

## 2014-11-24 DIAGNOSIS — Z87891 Personal history of nicotine dependence: Secondary | ICD-10-CM | POA: Insufficient documentation

## 2014-11-24 DIAGNOSIS — I1310 Hypertensive heart and chronic kidney disease without heart failure, with stage 1 through stage 4 chronic kidney disease, or unspecified chronic kidney disease: Secondary | ICD-10-CM | POA: Insufficient documentation

## 2014-11-24 DIAGNOSIS — M545 Low back pain: Secondary | ICD-10-CM | POA: Diagnosis present

## 2014-11-24 DIAGNOSIS — Z7982 Long term (current) use of aspirin: Secondary | ICD-10-CM | POA: Diagnosis not present

## 2014-11-24 DIAGNOSIS — Z9889 Other specified postprocedural states: Secondary | ICD-10-CM | POA: Diagnosis not present

## 2014-11-24 DIAGNOSIS — R011 Cardiac murmur, unspecified: Secondary | ICD-10-CM | POA: Insufficient documentation

## 2014-11-24 DIAGNOSIS — M5410 Radiculopathy, site unspecified: Secondary | ICD-10-CM
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
## 2014-11-25 MED ORDER — NAPROXEN 500 MG PO TABS: 500.0000 mg | ORAL_TABLET | Freq: Two times a day (BID) | ORAL | Status: DC

## 2014-11-25 NOTE — Discharge Instructions (Signed)
Radicular Pain  Radicular pain in either the arm or leg is usually from a bulging or herniated disk in the spine. A piece of the herniated disk may press against the nerves as the nerves exit the spine. This causes pain which is felt at the tips of the nerves down the arm or leg. Other causes of radicular pain may include:  · Fractures.  · Heart disease.  · Cancer.  · An abnormal and usually degenerative state of the nervous system or nerves (neuropathy).  Diagnosis may require CT or MRI scanning to determine the primary cause.   Nerves that start at the neck (nerve roots) may cause radicular pain in the outer shoulder and arm. It can spread down to the thumb and fingers. The symptoms vary depending on which nerve root has been affected. In most cases radicular pain improves with conservative treatment. Neck problems may require physical therapy, a neck collar, or cervical traction. Treatment may take many weeks, and surgery may be considered if the symptoms do not improve.   Conservative treatment is also recommended for sciatica. Sciatica causes pain to radiate from the lower back or buttock area down the leg into the foot. Often there is a history of back problems. Most patients with sciatica are better after 2 to 4 weeks of rest and other supportive care. Short term bed rest can reduce the disk pressure considerably. Sitting, however, is not a good position since this increases the pressure on the disk. You should avoid bending, lifting, and all other activities which make the problem worse. Traction can be used in severe cases. Surgery is usually reserved for patients who do not improve within the first months of treatment.  Only take over-the-counter or prescription medicines for pain, discomfort, or fever as directed by your caregiver. Narcotics and muscle relaxants may help by relieving more severe pain and spasm and by providing mild sedation. Cold or massage can give significant relief. Spinal manipulation  is not recommended. It can increase the degree of disc protrusion. Epidural steroid injections are often effective treatment for radicular pain. These injections deliver medicine to the spinal nerve in the space between the protective covering of the spinal cord and back bones (vertebrae). Your caregiver can give you more information about steroid injections. These injections are most effective when given within two weeks of the onset of pain.   You should see your caregiver for follow up care as recommended. A program for neck and back injury rehabilitation with stretching and strengthening exercises is an important part of management.   SEEK IMMEDIATE MEDICAL CARE IF:  · You develop increased pain, weakness, or numbness in your arm or leg.  · You develop difficulty with bladder or bowel control.  · You develop abdominal pain.  Document Released: 03/30/2004 Document Revised: 05/15/2011 Document Reviewed: 06/15/2008  Exit[employer]® Patient Information ©2015 [employer]. This information is not intended to replace advice given to you by your health care provider. Make sure you discuss any questions you have with your health care provider.

## 2014-11-25 NOTE — ED Provider Notes (Signed)
CSN: [id number]     Arrival date & time 11/24/14  2158 History   First MD Initiated Contact with Patient 11/25/14 0011     Chief Complaint  Patient presents with  . Back Pain     (Consider location/radiation/quality/duration/timing/severity/associated sxs/prior Treatment) HPI Comments: Patient reporting periodic episodes of back pain, chronic in nature. Recent surgery by Dr. Yetta Barre.  Today, patient is requesting evaluation for intermittent hand numbness and pain.  Patient is a 64 y.o. female presenting with back pain. The history is provided by the patient. No language interpreter was used.  Back Pain Location:  Lumbar spine Quality:  Stiffness Stiffness is present:  All day Pain is:  Same all the time Chronicity:  Chronic Relieved by:  Narcotics Associated symptoms: no fever, no numbness, no perianal numbness, no tingling and no weakness     Past Medical History  Diagnosis Date  . Hypertension   . Left ventricular diastolic dysfunction, NYHA class 1 March 20    Severe concentric LVH (likely hypertensive), mild aortic sclerosis. Grade 1 diastolic dysfunction  . Coronary artery disease 04/2009    50% stenosis in the perforator of LAD; catheterization was for an abnormal Myoview in January 2000 showing anterior and inferolateral ischemia.  [name] PAD (peripheral artery disease) March 2013    Lower extremity Dopplers: R. SFA 50-60%, R. PTA proximally occluded with distal reconstitution;; L. common iliac ~50%, L. SFA 50-70% stenosis, L. PTA < 50%  . Hyperlipidemia   . Chronic kidney disease (CKD), stage II (mild)     Class I-II  . History of schizophrenia     However I am not sure about the validity of this. She is not on any medications.  . Chronic back pain   . 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 At least 3 subsequent echocardiograms have shown  improved/normal EF with moderate to severe concentric LVH and diastolic dysfunction with LVOT/intracavitary gradient  . Heart murmur     Previously reported moderate to severe MR. Most recent echo in May 2014 revealed mild MR.  [name] Hypertensive hypertrophic cardiomyopathy: NYHA class II:  Echo: Severe concentric LVH with LV OT gradient; essentially preserved EF with diastolic dysfunction 02/15/2013    Not significantly symptomatic; echocardiogram from May 2014: Severe concentric LVH with EF 50-55%. Somewhat unbelievably grade 1 diastolic dysfunction with moderate LE dilation. Trivial MR noted.   Past Surgical History  Procedure Laterality Date  . Bunionectomy    . Nm myocar perf wall motion  03/2009    Persantine; EF 51%-both anterior and inferolateral ischemia  . Lower extremity doppler  05/29/2011    right SFA 50% to 59% diameter reduction,right posterior tibal atreery occlusive disease,reconstituting distally, left common illiac<50%,left SFA 50 to70%,left post. tibial <50%  . Doppler echocardiography  May 2014    EF 50-55%; severe concentric LVH; only grade 1 diastolic dysfunction. Mild aortic sclerosis - with LVOT /intracavitary gradient of roughly 20 mmHg mean. Mild to moderately dilated LA;; previously reported MR not seen  . Doppler echocardiography  02/02/2009    mod to severe regurg mitral, EF 20 TO 25%, tricuspid severe regurg. ,pulmonic moderate regurg.  . Cardiac catheterization  04/09/2009    rt and lt cath -non obstructive coronary disease   . Carotid doppler  05/29/2011    left bulb/prox ICA moderate amtfibrous plaque with no evidence significant reduction.,right bulb /proximal ICA normal patency  Family History  Problem Relation Age of Onset  . Hypertension Mother    Social History  Substance Use Topics  . Smoking status: Former Smoker    Quit date: 05/14/2002  . Smokeless tobacco: Never Used  . Alcohol Use: No   OB History    No data available     Review of  Systems  Unable to perform ROS Constitutional: Negative for fever.  Musculoskeletal: Positive for back pain.  Neurological: Negative for tingling, weakness and numbness.  All other systems reviewed and are negative.     Allergies  Review of patient's allergies indicates no known allergies.  Home Medications   Prior to Admission medications   Medication Sig Start Date End Date Taking? Authorizing Provider  amLODipine (NORVASC) 10 MG tablet take 1 tablet by mouth once daily 03/13/14   Marykay Lex, MD  aspirin EC 81 MG tablet Take 81 mg by mouth daily.    Historical Provider, MD  atorvastatin (LIPITOR) 40 MG tablet Take 1 tablet (40 mg total) by mouth daily. 06/08/14   Marykay Lex, MD  carvedilol (COREG) 25 MG tablet Take 1 tablet (25 mg total) by mouth 2 (two) times daily with a meal. 03/13/14   Marykay Lex, MD  DIGITEK 125 MCG tablet take 1 tablet by mouth once daily 03/13/14   Marykay Lex, MD  hydrALAZINE (APRESOLINE) 50 MG tablet take 1 tablet by mouth three times a day 03/13/14   Marykay Lex, MD  isosorbide mononitrate (IMDUR) 60 MG 24 hr tablet take 1 1/2 tablets by mouth once daily 03/13/14   Marykay Lex, MD  methocarbamol (ROBAXIN) 500 MG tablet Take 1 tablet (500 mg total) by mouth 3 (three) times daily as needed for muscle spasms (back spasm). 09/28/14   Tia Alert, MD  omeprazole (PRILOSEC) 20 MG capsule Take 20 mg by mouth daily.  09/22/13   Historical Provider, MD  oxyCODONE-acetaminophen (PERCOCET/ROXICET) 5-325 MG per tablet Take 1-2 tablets by mouth every 6 (six) hours as needed for moderate pain. 09/28/14   Tia Alert, MD  spironolactone (ALDACTONE) 25 MG tablet take 1/2 tablet by mouth every morning 03/13/14   Marykay Lex, MD  sucralfate (CARAFATE) 1 G tablet Take 1 g by mouth 4 (four) times daily. 09/25/13   Rolland Porter, MD   BP 149/78 mmHg  Pulse 105  Temp(Src) 97.5 F (36.4 C) (Oral)  Resp 20  Ht  (1.727 m)  Wt 220 lb 7 oz (99.99 kg)  BMI  33.53 kg/m2  SpO2 97%  LMP 12/11/2011 Physical Exam  Constitutional: She is oriented to person, place, and time. She appears well-developed and well-nourished.  HENT:  Head: Normocephalic.  Eyes: Pupils are equal, round, and reactive to light.  Neck: Normal range of motion.  Cardiovascular: Normal rate, regular rhythm and intact distal pulses.   Murmur heard. Pulmonary/Chest: Effort normal and breath sounds normal.  Abdominal: Soft. Bowel sounds are normal.  Musculoskeletal: Normal range of motion. She exhibits no edema or tenderness.  Neurological: She is alert and oriented to person, place, and time. She has normal strength. No sensory deficit.  Skin: Skin is warm and dry.  Psychiatric: She has a normal mood and affect.  Nursing note and vitals reviewed.   ED Course  Procedures (including critical care time) Labs Review Labs Reviewed - No data to display  Imaging Review No results found. I have personally reviewed and evaluated these images and lab results as part  of my medical decision-making.   EKG Interpretation None     Pain and numbness in hands is episodic. On exam, she has mild cervical tenderness.  No grip or strength deficit noted in upper extremities. Normal strength of lower extremities. No bowel or bladder incontinence. Not febrile. She is scheduled to see neurosurgery next week. MDM   Final diagnoses:  None    Radicular pain.    Felicie Morn, NP 11/25/14 0131  Layla Maw Ward, DO 11/25/14 4132

## 2014-11-28 ENCOUNTER — Inpatient Hospital Stay (HOSPITAL_COMMUNITY)
Admission: EM | Admit: 2014-11-28 | Discharge: 2014-11-30 | DRG: 194 | Disposition: A | Discharge status: Home / Self Care | Payer: Medicare Other | Attending: Internal Medicine | Admitting: Internal Medicine

## 2014-11-28 ENCOUNTER — Encounter (HOSPITAL_COMMUNITY): Payer: Self-pay | Admitting: Emergency Medicine

## 2014-11-28 ENCOUNTER — Emergency Department (HOSPITAL_COMMUNITY): Payer: Medicare Other

## 2014-11-28 DIAGNOSIS — Z87891 Personal history of nicotine dependence: Secondary | ICD-10-CM | POA: Diagnosis not present

## 2014-11-28 DIAGNOSIS — Z7982 Long term (current) use of aspirin: Secondary | ICD-10-CM | POA: Diagnosis not present

## 2014-11-28 DIAGNOSIS — I5032 Chronic diastolic (congestive) heart failure: Secondary | ICD-10-CM | POA: Diagnosis present

## 2014-11-28 DIAGNOSIS — I2510 Atherosclerotic heart disease of native coronary artery without angina pectoris: Secondary | ICD-10-CM | POA: Diagnosis present

## 2014-11-28 DIAGNOSIS — Z6836 Body mass index (BMI) 36.0-36.9, adult: Secondary | ICD-10-CM | POA: Diagnosis not present

## 2014-11-28 DIAGNOSIS — I739 Peripheral vascular disease, unspecified: Secondary | ICD-10-CM | POA: Diagnosis not present

## 2014-11-28 DIAGNOSIS — Z23 Encounter for immunization: Secondary | ICD-10-CM | POA: Diagnosis not present

## 2014-11-28 DIAGNOSIS — R079 Chest pain, unspecified: Secondary | ICD-10-CM

## 2014-11-28 DIAGNOSIS — E669 Obesity, unspecified: Secondary | ICD-10-CM | POA: Diagnosis not present

## 2014-11-28 DIAGNOSIS — Z8249 Family history of ischemic heart disease and other diseases of the circulatory system: Secondary | ICD-10-CM

## 2014-11-28 DIAGNOSIS — I422 Other hypertrophic cardiomyopathy: Secondary | ICD-10-CM | POA: Diagnosis present

## 2014-11-28 DIAGNOSIS — E785 Hyperlipidemia, unspecified: Secondary | ICD-10-CM | POA: Diagnosis present

## 2014-11-28 DIAGNOSIS — J189 Pneumonia, unspecified organism: Secondary | ICD-10-CM | POA: Diagnosis not present

## 2014-11-28 DIAGNOSIS — N183 Chronic kidney disease, stage 3 unspecified: Secondary | ICD-10-CM | POA: Diagnosis present

## 2014-11-28 DIAGNOSIS — Z79899 Other long term (current) drug therapy: Secondary | ICD-10-CM | POA: Diagnosis not present

## 2014-11-28 DIAGNOSIS — N1831 Chronic kidney disease, stage 3a: Secondary | ICD-10-CM | POA: Diagnosis present

## 2014-11-28 DIAGNOSIS — J180 Bronchopneumonia, unspecified organism: Secondary | ICD-10-CM | POA: Diagnosis present

## 2014-11-28 DIAGNOSIS — I10 Essential (primary) hypertension: Secondary | ICD-10-CM | POA: Diagnosis present

## 2014-11-28 DIAGNOSIS — M545 Low back pain: Secondary | ICD-10-CM | POA: Diagnosis not present

## 2014-11-28 DIAGNOSIS — I129 Hypertensive chronic kidney disease with stage 1 through stage 4 chronic kidney disease, or unspecified chronic kidney disease: Secondary | ICD-10-CM | POA: Diagnosis present

## 2014-11-28 DIAGNOSIS — G8929 Other chronic pain: Secondary | ICD-10-CM | POA: Diagnosis not present

## 2014-11-28 DIAGNOSIS — Z981 Arthrodesis status: Secondary | ICD-10-CM

## 2014-11-28 DIAGNOSIS — I340 Nonrheumatic mitral (valve) insufficiency: Secondary | ICD-10-CM | POA: Diagnosis not present

## 2014-11-28 LAB — CBC
HEMATOCRIT: 36.3 % (ref 36.0–46.0)
HEMOGLOBIN: 11.9 g/dL — AB (ref 12.0–15.0)
MCH: 30.2 pg (ref 26.0–34.0)
MCHC: 32.8 g/dL (ref 30.0–36.0)
MCV: 92.1 fL (ref 78.0–100.0)
Platelets: 372 10*3/uL (ref 150–400)
RBC: 3.94 MIL/uL (ref 3.87–5.11)
RDW: 13.1 % (ref 11.5–15.5)
WBC: 9.4 10*3/uL (ref 4.0–10.5)

## 2014-11-28 LAB — COMPREHENSIVE METABOLIC PANEL
ALBUMIN: 3.6 g/dL (ref 3.5–5.0)
ALT: 15 U/L (ref 14–54)
AST: 21 U/L (ref 15–41)
Alkaline Phosphatase: 94 U/L (ref 38–126)
Anion gap: 9 (ref 5–15)
BUN: 17 mg/dL (ref 6–20)
CO2: 25 mmol/L (ref 22–32)
Calcium: 9.7 mg/dL (ref 8.9–10.3)
Chloride: 103 mmol/L (ref 101–111)
Creatinine, Ser: 1.52 mg/dL — ABNORMAL HIGH (ref 0.44–1.00)
GFR calc Af Amer: 41 mL/min — ABNORMAL LOW (ref >60)
GFR, EST NON AFRICAN AMERICAN: 35 mL/min — AB (ref >60)
GLUCOSE: 122 mg/dL — AB (ref 65–99)
POTASSIUM: 3.9 mmol/L (ref 3.5–5.1)
SODIUM: 137 mmol/L (ref 135–145)
Total Bilirubin: 0.3 mg/dL (ref 0.3–1.2)
Total Protein: 7.2 g/dL (ref 6.5–8.1)

## 2014-11-28 LAB — TROPONIN I

## 2014-11-28 LAB — LIPASE, BLOOD: LIPASE: 24 U/L (ref 22–51)

## 2014-11-28 LAB — URINALYSIS, ROUTINE W REFLEX MICROSCOPIC
Bilirubin Urine: NEGATIVE
Glucose, UA: NEGATIVE mg/dL
Hgb urine dipstick: NEGATIVE
KETONES UR: NEGATIVE mg/dL
NITRITE: NEGATIVE
Protein, ur: NEGATIVE mg/dL
Specific Gravity, Urine: 1.018 (ref 1.005–1.030)
UROBILINOGEN UA: 1 mg/dL (ref 0.0–1.0)
pH: 6.5 (ref 5.0–8.0)

## 2014-11-28 LAB — URINE MICROSCOPIC-ADD ON

## 2014-11-28 MED ORDER — AMLODIPINE BESYLATE 10 MG PO TABS
10.0000 mg | ORAL_TABLET | Freq: Every day | ORAL | Status: DC
  Administered 2014-11-28 – 2014-11-30 (×3): 10 mg via ORAL
  Filled 2014-11-28 (×3): qty 1

## 2014-11-28 MED ORDER — ASPIRIN EC 81 MG PO TBEC
81.0000 mg | DELAYED_RELEASE_TABLET | Freq: Every day | ORAL | Status: DC
  Administered 2014-11-28 – 2014-11-30 (×3): 81 mg via ORAL
  Filled 2014-11-28 (×3): qty 1

## 2014-11-28 MED ORDER — METHOCARBAMOL 500 MG PO TABS: 500.0000 mg | ORAL_TABLET | Freq: Three times a day (TID) | ORAL | Status: DC | PRN

## 2014-11-28 MED ORDER — HYDRALAZINE HCL 50 MG PO TABS
50.0000 mg | ORAL_TABLET | Freq: Three times a day (TID) | ORAL | Status: DC
  Administered 2014-11-28 – 2014-11-30 (×6): 50 mg via ORAL
  Filled 2014-11-28 (×6): qty 1

## 2014-11-28 MED ORDER — SODIUM CHLORIDE 0.9 % IV SOLN
INTRAVENOUS | Status: DC
  Administered 2014-11-28 – 2014-11-30 (×2): via INTRAVENOUS

## 2014-11-28 MED ORDER — PANTOPRAZOLE SODIUM 40 MG PO TBEC
40.0000 mg | DELAYED_RELEASE_TABLET | Freq: Every day | ORAL | Status: DC
  Administered 2014-11-28 – 2014-11-30 (×3): 40 mg via ORAL
  Filled 2014-11-28 (×3): qty 1

## 2014-11-28 MED ORDER — LEVOFLOXACIN 750 MG PO TABS
750.0000 mg | ORAL_TABLET | Freq: Every day | ORAL | Status: AC
  Administered 2014-11-29 – 2014-11-30 (×2): 750 mg via ORAL
  Filled 2014-11-28 (×2): qty 1

## 2014-11-28 MED ORDER — MORPHINE SULFATE (PF) 2 MG/ML IV SOLN: 2.0000 mg | INTRAVENOUS | Status: DC | PRN

## 2014-11-28 MED ORDER — PNEUMOCOCCAL VAC POLYVALENT 25 MCG/0.5ML IJ INJ
0.5000 mL | INJECTION | INTRAMUSCULAR | Status: AC
  Administered 2014-11-30: 0.5 mL via INTRAMUSCULAR
  Filled 2014-11-28 (×2): qty 0.5

## 2014-11-28 MED ORDER — FENTANYL CITRATE (PF) 100 MCG/2ML IJ SOLN
25.0000 ug | Freq: Once | INTRAMUSCULAR | Status: AC
  Administered 2014-11-28: 25 ug via INTRAVENOUS
  Filled 2014-11-28: qty 2

## 2014-11-28 MED ORDER — ENOXAPARIN SODIUM 40 MG/0.4ML ~~LOC~~ SOLN
40.0000 mg | SUBCUTANEOUS | Status: DC
  Administered 2014-11-28 – 2014-11-29 (×2): 40 mg via SUBCUTANEOUS
  Filled 2014-11-28 (×2): qty 0.4

## 2014-11-28 MED ORDER — LEVOFLOXACIN IN D5W 750 MG/150ML IV SOLN
750.0000 mg | Freq: Once | INTRAVENOUS | Status: AC
  Administered 2014-11-28: 750 mg via INTRAVENOUS
  Filled 2014-11-28: qty 150

## 2014-11-28 MED ORDER — ISOSORBIDE MONONITRATE ER 60 MG PO TB24
90.0000 mg | ORAL_TABLET | Freq: Every day | ORAL | Status: DC
  Administered 2014-11-28 – 2014-11-30 (×3): 90 mg via ORAL
  Filled 2014-11-28 (×6): qty 1

## 2014-11-28 MED ORDER — CARVEDILOL 25 MG PO TABS
25.0000 mg | ORAL_TABLET | Freq: Two times a day (BID) | ORAL | Status: DC
  Administered 2014-11-28 – 2014-11-30 (×4): 25 mg via ORAL
  Filled 2014-11-28 (×4): qty 1

## 2014-11-28 MED ORDER — ASPIRIN 81 MG PO CHEW
324.0000 mg | CHEWABLE_TABLET | Freq: Once | ORAL | Status: AC
  Administered 2014-11-28: 324 mg via ORAL
  Filled 2014-11-28: qty 4

## 2014-11-28 MED ORDER — ACETAMINOPHEN 650 MG RE SUPP: 650.0000 mg | Freq: Four times a day (QID) | RECTAL | Status: DC | PRN

## 2014-11-28 MED ORDER — ACETAMINOPHEN 325 MG PO TABS: 650.0000 mg | ORAL_TABLET | Freq: Four times a day (QID) | ORAL | Status: DC | PRN

## 2014-11-28 MED ORDER — DIGOXIN 125 MCG PO TABS
125.0000 ug | ORAL_TABLET | Freq: Every day | ORAL | Status: DC
  Administered 2014-11-28 – 2014-11-30 (×3): 125 ug via ORAL
  Filled 2014-11-28 (×3): qty 1

## 2014-11-28 MED ORDER — ONDANSETRON HCL 4 MG/2ML IJ SOLN: 4.0000 mg | Freq: Four times a day (QID) | INTRAMUSCULAR | Status: DC | PRN

## 2014-11-28 MED ORDER — ATORVASTATIN CALCIUM 40 MG PO TABS
40.0000 mg | ORAL_TABLET | Freq: Every day | ORAL | Status: DC
  Administered 2014-11-28 – 2014-11-30 (×3): 40 mg via ORAL
  Filled 2014-11-28 (×3): qty 1

## 2014-11-28 MED ORDER — SUCRALFATE 1 G PO TABS
1.0000 g | ORAL_TABLET | Freq: Four times a day (QID) | ORAL | Status: DC
  Administered 2014-11-28 – 2014-11-30 (×7): 1 g via ORAL
  Filled 2014-11-28 (×7): qty 1

## 2014-11-28 MED ORDER — SODIUM CHLORIDE 0.9 % IV BOLUS (SEPSIS)
1000.0000 mL | Freq: Once | INTRAVENOUS | Status: AC
  Administered 2014-11-28: 1000 mL via INTRAVENOUS

## 2014-11-28 MED ORDER — OXYCODONE-ACETAMINOPHEN 5-325 MG PO TABS
1.0000 | ORAL_TABLET | Freq: Four times a day (QID) | ORAL | Status: DC | PRN
  Administered 2014-11-29: 1 via ORAL
  Filled 2014-11-28: qty 1

## 2014-11-28 MED ORDER — ONDANSETRON HCL 4 MG PO TABS
4.0000 mg | ORAL_TABLET | Freq: Four times a day (QID) | ORAL | Status: DC | PRN
Start: 2014-11-28 — End: 2014-11-30

## 2014-11-28 MED ORDER — MAGNESIUM CITRATE PO SOLN: 1.0000 | Freq: Once | ORAL | Status: DC | PRN

## 2014-11-28 MED ORDER — MORPHINE SULFATE (PF) 4 MG/ML IV SOLN
4.0000 mg | Freq: Once | INTRAVENOUS | Status: AC
  Administered 2014-11-28: 4 mg via INTRAVENOUS
  Filled 2014-11-28: qty 1

## 2014-11-28 NOTE — ED Provider Notes (Signed)
CSN: [id number]     Arrival date & time 11/28/14  0507 History   First MD Initiated Contact with Patient 11/28/14 [phone]     Chief Complaint  Patient presents with  . Chest Pain  . Abdominal Pain     (Consider location/radiation/quality/duration/timing/severity/associated sxs/prior Treatment) HPI  64 year old female presents with multiple complaints. Primary complaint is dysuria and lower abdominal pain. Also complaining of chest pain in the middle of her chest and left breast. All this started around 11 PM last night. Has not noticed vaginal bleeding or hematuria. Also complaining of back pain that has worsened over this time as well. Patient has a history of CAD but she's not remember what pain felt like with this. Patient states all these pains the back seems to be the worst. Upper and lower back pain. No vomiting and no diarrhea. No fevers. Rates her pain as severe.  Past Medical History  Diagnosis Date  . Hypertension   . Left ventricular diastolic dysfunction, NYHA class 1 March 20    Severe concentric LVH (likely hypertensive), mild aortic sclerosis. Grade 1 diastolic dysfunction  . Coronary artery disease 04/2009    50% stenosis in the perforator of LAD; catheterization was for an abnormal Myoview in January 2000 showing anterior and inferolateral ischemia.  [name] PAD (peripheral artery disease) March 2013    Lower extremity Dopplers: R. SFA 50-60%, R. PTA proximally occluded with distal reconstitution;; L. common iliac ~50%, L. SFA 50-70% stenosis, L. PTA < 50%  . Hyperlipidemia   . Chronic kidney disease (CKD), stage II (mild)     Class I-II  . History of schizophrenia     However I am not sure about the validity of this. She is not on any medications.  . Chronic back pain   . 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 At least 3 subsequent echocardiograms have  shown improved/normal EF with moderate to severe concentric LVH and diastolic dysfunction with LVOT/intracavitary gradient  . Heart murmur     Previously reported moderate to severe MR. Most recent echo in May 2014 revealed mild MR.  [name] Hypertensive hypertrophic cardiomyopathy: NYHA class II:  Echo: Severe concentric LVH with LV OT gradient; essentially preserved EF with diastolic dysfunction 02/15/2013    Not significantly symptomatic; echocardiogram from May 2014: Severe concentric LVH with EF 50-55%. Somewhat unbelievably grade 1 diastolic dysfunction with moderate LE dilation. Trivial MR noted.   Past Surgical History  Procedure Laterality Date  . Bunionectomy    . Nm myocar perf wall motion  03/2009    Persantine; EF 51%-both anterior and inferolateral ischemia  . Lower extremity doppler  05/29/2011    right SFA 50% to 59% diameter reduction,right posterior tibal atreery occlusive disease,reconstituting distally, left common illiac<50%,left SFA 50 to70%,left post. tibial <50%  . Doppler echocardiography  May 2014    EF 50-55%; severe concentric LVH; only grade 1 diastolic dysfunction. Mild aortic sclerosis - with LVOT /intracavitary gradient of roughly 20 mmHg mean. Mild to moderately dilated LA;; previously reported MR not seen  . Doppler echocardiography  02/02/2009    mod to severe regurg mitral, EF 20 TO 25%, tricuspid severe regurg. ,pulmonic moderate regurg.  . Cardiac catheterization  04/09/2009    rt and lt cath -non obstructive coronary disease   . Carotid doppler  05/29/2011    left bulb/prox ICA moderate amtfibrous plaque with no evidence  significant reduction.,right bulb /proximal ICA normal patency   Family History  Problem Relation Age of Onset  . Hypertension Mother    Social History  Substance Use Topics  . Smoking status: Former Smoker    Quit date: 05/14/2002  . Smokeless tobacco: Never Used  . Alcohol Use: No   OB History    No data available     Review of  Systems  Constitutional: Negative for fever.  HENT: Positive for congestion.   Respiratory: Positive for cough and shortness of breath.   Cardiovascular: Positive for chest pain.  Gastrointestinal: Positive for abdominal pain. Negative for vomiting.  Genitourinary: Positive for dysuria and frequency. Negative for hematuria and vaginal bleeding.  Musculoskeletal: Positive for back pain.  All other systems reviewed and are negative.     Allergies  Review of patient's allergies indicates no known allergies.  Home Medications   Prior to Admission medications   Medication Sig Start Date End Date Taking? Authorizing Provider  amLODipine (NORVASC) 10 MG tablet take 1 tablet by mouth once daily 03/13/14   Marykay Lex, MD  aspirin EC 81 MG tablet Take 81 mg by mouth daily.    Historical Provider, MD  atorvastatin (LIPITOR) 40 MG tablet Take 1 tablet (40 mg total) by mouth daily. 06/08/14   Marykay Lex, MD  carvedilol (COREG) 25 MG tablet Take 1 tablet (25 mg total) by mouth 2 (two) times daily with a meal. 03/13/14   Marykay Lex, MD  DIGITEK 125 MCG tablet take 1 tablet by mouth once daily 03/13/14   Marykay Lex, MD  hydrALAZINE (APRESOLINE) 50 MG tablet take 1 tablet by mouth three times a day 03/13/14   Marykay Lex, MD  isosorbide mononitrate (IMDUR) 60 MG 24 hr tablet take 1 1/2 tablets by mouth once daily 03/13/14   Marykay Lex, MD  methocarbamol (ROBAXIN) 500 MG tablet Take 1 tablet (500 mg total) by mouth 3 (three) times daily as needed for muscle spasms (back spasm). 09/28/14   Tia Alert, MD  naproxen (NAPROSYN) 500 MG tablet Take 1 tablet (500 mg total) by mouth 2 (two) times daily. 11/25/14   Felicie Morn, NP  omeprazole (PRILOSEC) 20 MG capsule Take 20 mg by mouth daily.  09/22/13   Historical Provider, MD  oxyCODONE-acetaminophen (PERCOCET/ROXICET) 5-325 MG per tablet Take 1-2 tablets by mouth every 6 (six) hours as needed for moderate pain. 09/28/14   Tia Alert, MD    spironolactone (ALDACTONE) 25 MG tablet take 1/2 tablet by mouth every morning 03/13/14   Marykay Lex, MD  sucralfate (CARAFATE) 1 G tablet Take 1 g by mouth 4 (four) times daily. 09/25/13   Rolland Porter, MD   BP 131/75 mmHg  Pulse 81  Resp 19  Ht 5\' 7"  (1.702 m)  Wt 213 lb (96.616 kg)  BMI 33.35 kg/m2  SpO2 97%  LMP 12/11/2011 Physical Exam  Constitutional: She is oriented to person, place, and time. She appears well-developed and well-nourished.  HENT:  Head: Normocephalic and atraumatic.  Right Ear: External ear normal.  Left Ear: External ear normal.  Nose: Nose normal.  Eyes: Right eye exhibits no discharge. Left eye exhibits no discharge.  Neck: Neck supple.  Cardiovascular: Normal rate, regular rhythm and normal heart sounds.   Pulmonary/Chest: Effort normal and breath sounds normal. She exhibits tenderness.  Abdominal: Soft. There is tenderness (mild) in the right lower quadrant, suprapubic area and left lower quadrant.  Musculoskeletal:  Cervical back: She exhibits tenderness.       Thoracic back: She exhibits tenderness.       Lumbar back: She exhibits tenderness.       Back:  Neurological: She is alert and oriented to person, place, and time.  Skin: Skin is warm and dry. She is not diaphoretic.  Nursing note and vitals reviewed.   ED Course  Procedures (including critical care time) Labs Review Labs Reviewed  CBC - Abnormal; Notable for the following:    Hemoglobin 11.9 (*)    All other components within normal limits  URINALYSIS, ROUTINE W REFLEX MICROSCOPIC (NOT AT [facility]) - Abnormal; Notable for the following:    APPearance CLOUDY (*)    Leukocytes, UA SMALL (*)    All other components within normal limits  COMPREHENSIVE METABOLIC PANEL - Abnormal; Notable for the following:    Glucose, Bld 122 (*)    Creatinine, Ser 1.52 (*)    GFR calc non Af Amer 35 (*)    GFR calc Af Amer 41 (*)    All other components within normal limits  URINE  MICROSCOPIC-ADD ON - Abnormal; Notable for the following:    Squamous Epithelial / LPF MANY (*)    All other components within normal limits  CULTURE, BLOOD (ROUTINE X 2)  CULTURE, BLOOD (ROUTINE X 2)  LIPASE, BLOOD  TROPONIN I    Imaging Review Ct Abdomen Pelvis Wo Contrast  11/28/2014   CLINICAL DATA:  Chest/back/lower abdominal pain  EXAM: CT CHEST, ABDOMEN AND PELVIS WITHOUT CONTRAST  TECHNIQUE: Multidetector CT imaging of the chest, abdomen and pelvis was performed following the standard protocol without IV contrast.  COMPARISON:  CT chest dated 05/13/2012. CT abdomen pelvis dated 04/23/2010.  FINDINGS: CT CHEST FINDINGS  Mediastinum/Nodes: Heart is top-normal in size. No pericardial effusion.  Atherosclerotic calcifications of the aortic arch.  Small mediastinal lymph nodes which do not meet pathologic CT size criteria.  Visualized thyroid is mildly heterogeneous/ nodular.  Lungs/Pleura: Multifocal patchy tree-in-bud nodularity bilaterally, measuring up to 8 mm (series 3/image 12) in the posterior right upper lobe, suspicious for infection such as mild bronchopneumonia.  Mild dependent atelectasis in the right lower lobe.  Mild centrilobular and paraseptal emphysematous changes.  No pleural effusion or pneumothorax.  Musculoskeletal: Degenerative changes the thoracic spine.  Right shoulder arthroplasty.  CT ABDOMEN PELVIS FINDINGS  Hepatobiliary: Unenhanced liver is unremarkable.  Gallbladder is unremarkable. No intrahepatic or extrahepatic ductal dilatation.  Pancreas: Within normal limits.  Spleen: Within normal limits.  Adrenals/Urinary Tract: Adrenal glands are mildly thickened but without discrete mass.  Kidneys are within normal limits.  No renal, ureteral, or bladder calculi.  No hydronephrosis.  Bladder is within normal limits.  Stomach/Bowel: Stomach is within normal limits.  No evidence of bowel obstruction.  Normal appendix.  Colonic diverticulosis, without evidence of diverticulitis.   Vascular/Lymphatic: Atherosclerotic calcifications of the abdominal aorta and branch vessels.  No suspicious abdominopelvic lymphadenopathy.  Reproductive: Uterus is unremarkable.  Left ovary is within normal limits.  4.8 x 3.0 cm cystic right ovarian lesion, previously 4.8 x 2.7 cm, grossly unchanged since 2012.  Other: No abdominopelvic ascites.  Musculoskeletal: Degenerative changes of the lumbar spine. Status post PLIF at L4-S1.  IMPRESSION: Mild patchy tree-in-bud nodularity in the lungs bilaterally, suspicious for infection such as mild bronchopneumonia.  No evidence of bowel obstruction.  Normal appendix.  4.8 x 3.0 cm cystic right ovarian lesion, grossly unchanged since 2012, likely benign.   Electronically Signed  By: Charline Bills M.D.   On: 11/28/2014 08:19   Dg Chest 2 View  11/28/2014   CLINICAL DATA:  Chest pain come ache epigastric pain, and lower abdominal pain.  EXAM: CHEST  2 VIEW  COMPARISON:  09/25/2013  FINDINGS: Normal heart size and pulmonary vascularity. No focal airspace disease or consolidation in the lungs. No blunting of costophrenic angles. No pneumothorax. Mediastinal contours appear intact. Postoperative changes in the low right shoulder. Degenerative changes in the left shoulder and spine.  IMPRESSION: No active cardiopulmonary disease.   Electronically Signed   By: Burman Nieves M.D.   On: 11/28/2014 06:36   Ct Chest Wo Contrast  11/28/2014   CLINICAL DATA:  Chest/back/lower abdominal pain  EXAM: CT CHEST, ABDOMEN AND PELVIS WITHOUT CONTRAST  TECHNIQUE: Multidetector CT imaging of the chest, abdomen and pelvis was performed following the standard protocol without IV contrast.  COMPARISON:  CT chest dated 05/13/2012. CT abdomen pelvis dated 04/23/2010.  FINDINGS: CT CHEST FINDINGS  Mediastinum/Nodes: Heart is top-normal in size. No pericardial effusion.  Atherosclerotic calcifications of the aortic arch.  Small mediastinal lymph nodes which do not meet pathologic CT size  criteria.  Visualized thyroid is mildly heterogeneous/ nodular.  Lungs/Pleura: Multifocal patchy tree-in-bud nodularity bilaterally, measuring up to 8 mm (series 3/image 12) in the posterior right upper lobe, suspicious for infection such as mild bronchopneumonia.  Mild dependent atelectasis in the right lower lobe.  Mild centrilobular and paraseptal emphysematous changes.  No pleural effusion or pneumothorax.  Musculoskeletal: Degenerative changes the thoracic spine.  Right shoulder arthroplasty.  CT ABDOMEN PELVIS FINDINGS  Hepatobiliary: Unenhanced liver is unremarkable.  Gallbladder is unremarkable. No intrahepatic or extrahepatic ductal dilatation.  Pancreas: Within normal limits.  Spleen: Within normal limits.  Adrenals/Urinary Tract: Adrenal glands are mildly thickened but without discrete mass.  Kidneys are within normal limits.  No renal, ureteral, or bladder calculi.  No hydronephrosis.  Bladder is within normal limits.  Stomach/Bowel: Stomach is within normal limits.  No evidence of bowel obstruction.  Normal appendix.  Colonic diverticulosis, without evidence of diverticulitis.  Vascular/Lymphatic: Atherosclerotic calcifications of the abdominal aorta and branch vessels.  No suspicious abdominopelvic lymphadenopathy.  Reproductive: Uterus is unremarkable.  Left ovary is within normal limits.  4.8 x 3.0 cm cystic right ovarian lesion, previously 4.8 x 2.7 cm, grossly unchanged since 2012.  Other: No abdominopelvic ascites.  Musculoskeletal: Degenerative changes of the lumbar spine. Status post PLIF at L4-S1.  IMPRESSION: Mild patchy tree-in-bud nodularity in the lungs bilaterally, suspicious for infection such as mild bronchopneumonia.  No evidence of bowel obstruction.  Normal appendix.  4.8 x 3.0 cm cystic right ovarian lesion, grossly unchanged since 2012, likely benign.   Electronically Signed   By: Charline Bills M.D.   On: 11/28/2014 08:19   I have personally reviewed and evaluated these images  and lab results as part of my medical decision-making.   EKG Interpretation   Date/Time:  Saturday November 28 2014 05:17:36 EDT Ventricular Rate:  88 PR Interval:  158 QRS Duration: 91 QT Interval:  421 QTC Calculation: 509 R Axis:   45 Text Interpretation:  Sinus rhythm Right atrial enlargement Nonspecific  repol abnrm, inferolateral lds Prolonged QT interval Sinus rhythm ST-t  wave abnormality Artifact Abnormal ekg Confirmed by Gerhard Munch  MD  ([id number]) on 11/28/2014 6:04:37 AM      MDM   Final diagnoses:  HCAP (healthcare-associated pneumonia)  Chest pain, unspecified chest pain type    Patient is well-appearing  but complains of diffuse pain as above. Does have a cough and CT scan shows mild pneumonia. Is afebrile, will treat with Levaquin given that she was recently admitted to the hospital 2 months ago. Her EKG does show nonspecific ST/T-wave changes that seem more pronounced than prior EKG. Given her history of CAD, I feel she deserves an ACS rule out. Plan to admit to the hospitalist for further care. Patient does have diffuse pain but I have low suspicion for an aortic dissection/aneurysm, especially with normal caliber aorta despite a noncontrasted CT scan given her renal function.    Pricilla Loveless, MD 11/28/14 [phone]

## 2014-11-28 NOTE — ED Provider Notes (Signed)
Brief screening exam conducted as the patient was rolling to her x-ray evaluation. Patient appears calm, vital signs stable, remaining of evaluation likely conducted by another practitioner.   EKG Interpretation  Date/Time:  Saturday November 28 2014 05:17:36 EDT Ventricular Rate:  88 PR Interval:  158 QRS Duration: 91 QT Interval:  421 QTC Calculation: 509 R Axis:   45 Text Interpretation:  Sinus rhythm Right atrial enlargement Nonspecific repol abnrm, inferolateral lds Prolonged QT interval Sinus rhythm ST-t wave abnormality Artifact Abnormal ekg Confirmed by Gerhard Munch  MD ([id number]) on 11/28/2014 6:04:37 AM        Gerhard Munch, MD 11/28/14 [phone]

## 2014-11-28 NOTE — ED Notes (Signed)
Pt ambulated with standby assistance, pt tolerated well.

## 2014-11-28 NOTE — ED Notes (Signed)
Pt arrives from home c/o CP, epigastric and lower abdominal pain.  Pt reports waking from sleep to pain, feeling of pressure.  Pt endorses N, dizziness, lightheadedness.  Pt denies V, D, LOC.  Pt reports dysuria and increased frequency of urination.  Resp e/u at this time. Pain 7/10

## 2014-11-28 NOTE — ED Notes (Signed)
Heart healthy lunch tray ordered 

## 2014-11-28 NOTE — H&P (Signed)
History and Physical  Erin Riley [name]:[id number] DOB: Mar 02, 1951 DOA: 11/28/2014  Referring physician: Dr. Criss Alvine, EDP PCP: Billee Cashing, MD   Chief Complaint: chest pain   HPI: 64 y.o. female w/ a hx of CAD, diastolic CHF, PAD, HLD, CKD stage 2, HTN, and hypertensive hypertrophic CM who presented to the ED c/o chest and epigastric pain.  This pain started abruptly while the pt was at rest.  There were no particular exacerbating or alleviating factors.  The pain is described as a pressure type sensation.  Though she states it was in her chest on exam it appears to be primarily epigastric.  She denies nausea vomiting or diarrhea but does admit to constipation.  She admits to some shortness of breath intermittently associated with this pain but not necessarily associated with exertion.  She states "I think him coming down with something" because she has been expectorating a modest amount of thickish whitish to tan sputum.  She "felt feverish last night" but did not measure her temperature and she denies having had chills.  In the emergency room a plain film of the chest was obtained which was unrevealing.  Given that some of her pain was also located in the back CT scan of chest and abdomen was accomplished to rule out dissection.  There was no evidence of dissection but there was a suggestion of a possible early bronchopneumonia.  Laboratory studies have been primarily unremarkable.  EKG is a poor quality study but does suggest the possibility of ST changes in the inferior leads.  Assessment/Plan  Chest Pain - known CAD w/ 50% stenosis in the perforator of LAD per cath 2011 The patient's chest pain is atypical in many ways - she does have a significant history of coronary artery disease however - she will be admitted to the acute units and we will rule her out for acute coronary syndrome with serial troponins and a repeat EKG - we will also investigate potential GI sources in that a lot of  her pain is epigastric   ?bronchopneumonia  CXR noted "mild patchy tree-in-bud nodularity in the lungs bilaterally" - plan for short antibiotic course with clinical monitoring  CKD stage 2 crt baseline appears to be approximately 1.2 with mildly elevated creatinine of 1.52 at presentation  Normocytic anemia Likely due to CKD - check anemia panel to rule out iron deficiency  PAD Denies claudication symptoms  HTN Reasonably well controlled at present though not ideal  HLD Follow-up lipid panel in a.m.  Chronic back pain s/p lumbar surgery   Does not appear to be worse than her baseline pain  Obesity - Body mass index is 33.35 kg/(m^2).  Code Status: FULL DVT Prophylaxis: lovenox Family Communication: no family present at time of exam  Disposition Plan: admit to tele bed - tx pna - r/o ACS   Past Medical History  Diagnosis Date  . Hypertension   . Left ventricular diastolic dysfunction, NYHA class 1 March 20    Severe concentric LVH (likely hypertensive), mild aortic sclerosis. Grade 1 diastolic dysfunction  . Coronary artery disease 04/2009    50% stenosis in the perforator of LAD; catheterization was for an abnormal Myoview in January 2000 showing anterior and inferolateral ischemia.  [name] PAD (peripheral artery disease) March 2013    Lower extremity Dopplers: R. SFA 50-60%, R. PTA proximally occluded with distal reconstitution;; L. common iliac ~50%, L. SFA 50-70% stenosis, L. PTA < 50%  . Hyperlipidemia   . Chronic kidney disease (  CKD), stage II (mild)     Class I-II  . History of schizophrenia     However I am not sure about the validity of this. She is not on any medications.  . Chronic back pain   . 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 At least 3 subsequent echocardiograms have shown improved/normal EF with moderate to severe concentric LVH  and diastolic dysfunction with LVOT/intracavitary gradient  . Heart murmur     Previously reported moderate to severe MR. Most recent echo in May 2014 revealed mild MR.  [name] Hypertensive hypertrophic cardiomyopathy: NYHA class II:  Echo: Severe concentric LVH with LV OT gradient; essentially preserved EF with diastolic dysfunction 02/15/2013    Not significantly symptomatic; echocardiogram from May 2014: Severe concentric LVH with EF 50-55%. Somewhat unbelievably grade 1 diastolic dysfunction with moderate LE dilation. Trivial MR noted.   Past Surgical History  Procedure Laterality Date  . Bunionectomy    . Nm myocar perf wall motion  03/2009    Persantine; EF 51%-both anterior and inferolateral ischemia  . Lower extremity doppler  05/29/2011    right SFA 50% to 59% diameter reduction,right posterior tibal atreery occlusive disease,reconstituting distally, left common illiac<50%,left SFA 50 to70%,left post. tibial <50%  . Doppler echocardiography  May 2014    EF 50-55%; severe concentric LVH; only grade 1 diastolic dysfunction. Mild aortic sclerosis - with LVOT /intracavitary gradient of roughly 20 mmHg mean. Mild to moderately dilated LA;; previously reported MR not seen  . Doppler echocardiography  02/02/2009    mod to severe regurg mitral, EF 20 TO 25%, tricuspid severe regurg. ,pulmonic moderate regurg.  . Cardiac catheterization  04/09/2009    rt and lt cath -non obstructive coronary disease   . Carotid doppler  05/29/2011    left bulb/prox ICA moderate amtfibrous plaque with no evidence significant reduction.,right bulb /proximal ICA normal patency    No Known Allergies  Social Hx:  reports that she quit smoking about 12 years ago. She has never used smokeless tobacco. She reports that she does not drink alcohol or use illicit drugs. She lives independently in [place] and at baseline is fully functional.  Family Hx:   Family History  Problem Relation Age of Onset  . Hypertension  Mother      Review of Systems:  Constitutional:  No weight loss, night sweats, fatigue.  HEENT:  No headaches, Difficulty swallowing,Tooth/dental problems,Sore throat,  No sneezing, itching, ear ache, nasal congestion, post nasal drip,  Cardio-vascular:  No Orthopnea, PND, swelling in lower extremities, anasarca, dizziness, palpitations  GI:  No nausea, vomiting, diarrhea, change in bowel habits, loss of appetite  Resp:  No coughing up of blood.No change in color of mucus.No wheezing.No chest wall deformity  Skin:  no rash or lesions.  GU:  no dysuria, change in color of urine, no urgency or frequency. No flank pain.  Musculoskeletal:  No joint pain or swelling. No decreased range of motion. Psych:  No change in mood or affect. No depression or anxiety. No memory loss.   Prior to Admission medications   Medication Sig Start Date End Date Taking? Authorizing Provider  amLODipine (NORVASC) 10 MG tablet take 1 tablet by mouth once daily 03/13/14  Yes Marykay Lex, MD  aspirin EC 81 MG tablet Take 81 mg by mouth daily.   Yes Historical Provider, MD  atorvastatin (LIPITOR) 40  MG tablet Take 1 tablet (40 mg total) by mouth daily. 06/08/14  Yes Marykay Lex, MD  carvedilol (COREG) 25 MG tablet Take 1 tablet (25 mg total) by mouth 2 (two) times daily with a meal. 03/13/14  Yes Marykay Lex, MD  DIGITEK 125 MCG tablet take 1 tablet by mouth once daily 03/13/14  Yes Marykay Lex, MD  hydrALAZINE (APRESOLINE) 50 MG tablet take 1 tablet by mouth three times a day 03/13/14  Yes Marykay Lex, MD  isosorbide mononitrate (IMDUR) 60 MG 24 hr tablet take 1 1/2 tablets by mouth once daily 03/13/14  Yes Marykay Lex, MD  methocarbamol (ROBAXIN) 500 MG tablet Take 1 tablet (500 mg total) by mouth 3 (three) times daily as needed for muscle spasms (back spasm). 09/28/14  Yes Tia Alert, MD  naproxen (NAPROSYN) 500 MG tablet Take 1 tablet (500 mg total) by mouth 2 (two) times daily. 11/25/14  Yes  Felicie Morn, NP  omeprazole (PRILOSEC) 20 MG capsule Take 20 mg by mouth daily.  09/22/13  Yes Historical Provider, MD  oxyCODONE-acetaminophen (PERCOCET/ROXICET) 5-325 MG per tablet Take 1-2 tablets by mouth every 6 (six) hours as needed for moderate pain. 09/28/14  Yes Tia Alert, MD  spironolactone (ALDACTONE) 25 MG tablet take 1/2 tablet by mouth every morning 03/13/14  Yes Marykay Lex, MD  sucralfate (CARAFATE) 1 G tablet Take 1 g by mouth 4 (four) times daily. 09/25/13  Yes Rolland Porter, MD    Physical Exam: Filed Vitals:   11/28/14 0553 11/28/14 0600 11/28/14 0700 11/28/14 0715  BP: 118/71 130/73 131/75 139/72  Pulse: 91 86 81 82  Resp: 22 17 19 19   Height:      Weight:      SpO2: 97% 99% 97% 96%    Wt Readings from Last 3 Encounters:  11/28/14 96.616 kg (213 lb)  11/24/14 99.99 kg (220 lb 7 oz)  09/24/14 106.051 kg (233 lb 12.8 oz)    General: No acute respiratory distress  HEENT: Normocephalic,atraumatic, pupils equal round reactive to light and accommodation, extraocular muscles intact bilaterally, OC/OP clear, sclera nonicteric Neck: No JVD or thyromegaly Lungs: Clear to auscultation bilaterally without wheezes or rhonchi, with good air movement throughout all fields though bs somewhat distant  Cardiovascular: Regular rate and rhythm without gallop or rub - 2/6 holosystolic M Abdomen: Nontender, overweight , soft, bowel sounds present, no organomegaly, no rebound, no ascites Extremities: No significant cyanosis, clubbing, edema bilateral lower extremities Neurologic: Alert and oriented x4, cranial nerves II through XII intact bilaterally, 5 over 5 strength bilateral upper and lower extremities, no Babinski, intact to sensation of touch throughout  Labs on Admission:  Basic Metabolic Panel:  Recent Labs Lab 11/28/14 0532  NA 137  K 3.9  CL 103  CO2 25  GLUCOSE 122*  BUN 17  CREATININE 1.52*  CALCIUM 9.7   Liver Function Tests:  Recent Labs Lab  11/28/14 0532  AST 21  ALT 15  ALKPHOS 94  BILITOT 0.3  PROT 7.2  ALBUMIN 3.6    Recent Labs Lab 11/28/14 0532  LIPASE 24   CBC:  Recent Labs Lab 11/28/14 0532  WBC 9.4  HGB 11.9*  HCT 36.3  MCV 92.1  PLT 372   Cardiac Enzymes:  Recent Labs Lab 11/28/14 0532  TROPONINI <0.03   Radiological Exams on Admission: Ct Abdomen Pelvis Wo Contrast  11/28/2014   CLINICAL DATA:  Chest/back/lower abdominal pain  EXAM: CT CHEST, ABDOMEN  AND PELVIS WITHOUT CONTRAST  TECHNIQUE: Multidetector CT imaging of the chest, abdomen and pelvis was performed following the standard protocol without IV contrast.  COMPARISON:  CT chest dated 05/13/2012. CT abdomen pelvis dated 04/23/2010.  FINDINGS: CT CHEST FINDINGS  Mediastinum/Nodes: Heart is top-normal in size. No pericardial effusion.  Atherosclerotic calcifications of the aortic arch.  Small mediastinal lymph nodes which do not meet pathologic CT size criteria.  Visualized thyroid is mildly heterogeneous/ nodular.  Lungs/Pleura: Multifocal patchy tree-in-bud nodularity bilaterally, measuring up to 8 mm (series 3/image 12) in the posterior right upper lobe, suspicious for infection such as mild bronchopneumonia.  Mild dependent atelectasis in the right lower lobe.  Mild centrilobular and paraseptal emphysematous changes.  No pleural effusion or pneumothorax.  Musculoskeletal: Degenerative changes the thoracic spine.  Right shoulder arthroplasty.  CT ABDOMEN PELVIS FINDINGS  Hepatobiliary: Unenhanced liver is unremarkable.  Gallbladder is unremarkable. No intrahepatic or extrahepatic ductal dilatation.  Pancreas: Within normal limits.  Spleen: Within normal limits.  Adrenals/Urinary Tract: Adrenal glands are mildly thickened but without discrete mass.  Kidneys are within normal limits.  No renal, ureteral, or bladder calculi.  No hydronephrosis.  Bladder is within normal limits.  Stomach/Bowel: Stomach is within normal limits.  No evidence of bowel  obstruction.  Normal appendix.  Colonic diverticulosis, without evidence of diverticulitis.  Vascular/Lymphatic: Atherosclerotic calcifications of the abdominal aorta and branch vessels.  No suspicious abdominopelvic lymphadenopathy.  Reproductive: Uterus is unremarkable.  Left ovary is within normal limits.  4.8 x 3.0 cm cystic right ovarian lesion, previously 4.8 x 2.7 cm, grossly unchanged since 2012.  Other: No abdominopelvic ascites.  Musculoskeletal: Degenerative changes of the lumbar spine. Status post PLIF at L4-S1.  IMPRESSION: Mild patchy tree-in-bud nodularity in the lungs bilaterally, suspicious for infection such as mild bronchopneumonia.  No evidence of bowel obstruction.  Normal appendix.  4.8 x 3.0 cm cystic right ovarian lesion, grossly unchanged since 2012, likely benign.   Electronically Signed   By: Charline Bills M.D.   On: 11/28/2014 08:19   Dg Chest 2 View  11/28/2014   CLINICAL DATA:  Chest pain come ache epigastric pain, and lower abdominal pain.  EXAM: CHEST  2 VIEW  COMPARISON:  09/25/2013  FINDINGS: Normal heart size and pulmonary vascularity. No focal airspace disease or consolidation in the lungs. No blunting of costophrenic angles. No pneumothorax. Mediastinal contours appear intact. Postoperative changes in the low right shoulder. Degenerative changes in the left shoulder and spine.  IMPRESSION: No active cardiopulmonary disease.   Electronically Signed   By: Burman Nieves M.D.   On: 11/28/2014 06:36   Ct Chest Wo Contrast  11/28/2014   CLINICAL DATA:  Chest/back/lower abdominal pain  EXAM: CT CHEST, ABDOMEN AND PELVIS WITHOUT CONTRAST  TECHNIQUE: Multidetector CT imaging of the chest, abdomen and pelvis was performed following the standard protocol without IV contrast.  COMPARISON:  CT chest dated 05/13/2012. CT abdomen pelvis dated 04/23/2010.  FINDINGS: CT CHEST FINDINGS  Mediastinum/Nodes: Heart is top-normal in size. No pericardial effusion.  Atherosclerotic  calcifications of the aortic arch.  Small mediastinal lymph nodes which do not meet pathologic CT size criteria.  Visualized thyroid is mildly heterogeneous/ nodular.  Lungs/Pleura: Multifocal patchy tree-in-bud nodularity bilaterally, measuring up to 8 mm (series 3/image 12) in the posterior right upper lobe, suspicious for infection such as mild bronchopneumonia.  Mild dependent atelectasis in the right lower lobe.  Mild centrilobular and paraseptal emphysematous changes.  No pleural effusion or pneumothorax.  Musculoskeletal: Degenerative  changes the thoracic spine.  Right shoulder arthroplasty.  CT ABDOMEN PELVIS FINDINGS  Hepatobiliary: Unenhanced liver is unremarkable.  Gallbladder is unremarkable. No intrahepatic or extrahepatic ductal dilatation.  Pancreas: Within normal limits.  Spleen: Within normal limits.  Adrenals/Urinary Tract: Adrenal glands are mildly thickened but without discrete mass.  Kidneys are within normal limits.  No renal, ureteral, or bladder calculi.  No hydronephrosis.  Bladder is within normal limits.  Stomach/Bowel: Stomach is within normal limits.  No evidence of bowel obstruction.  Normal appendix.  Colonic diverticulosis, without evidence of diverticulitis.  Vascular/Lymphatic: Atherosclerotic calcifications of the abdominal aorta and branch vessels.  No suspicious abdominopelvic lymphadenopathy.  Reproductive: Uterus is unremarkable.  Left ovary is within normal limits.  4.8 x 3.0 cm cystic right ovarian lesion, previously 4.8 x 2.7 cm, grossly unchanged since 2012.  Other: No abdominopelvic ascites.  Musculoskeletal: Degenerative changes of the lumbar spine. Status post PLIF at L4-S1.  IMPRESSION: Mild patchy tree-in-bud nodularity in the lungs bilaterally, suspicious for infection such as mild bronchopneumonia.  No evidence of bowel obstruction.  Normal appendix.  4.8 x 3.0 cm cystic right ovarian lesion, grossly unchanged since 2012, likely benign.   Electronically Signed   By:  Charline Bills M.D.   On: 11/28/2014 08:19    EKG: sinus rhythm - prolonged QT at 509 - possible ST depression in inferior leads, though EKG poor quality   Time spent: 57 mins+  Lonia Blood, MD [facility] For Consults/Admissions - Flow Manager - [phone] Office  [phone] Pager [phone]  On-Call/Text Page:      [email or web address]      password [facility]

## 2014-11-29 DIAGNOSIS — E669 Obesity, unspecified: Secondary | ICD-10-CM

## 2014-11-29 LAB — COMPREHENSIVE METABOLIC PANEL
ALBUMIN: 3.2 g/dL — AB (ref 3.5–5.0)
ALT: 12 U/L — ABNORMAL LOW (ref 14–54)
ANION GAP: 6 (ref 5–15)
AST: 16 U/L (ref 15–41)
Alkaline Phosphatase: 82 U/L (ref 38–126)
BUN: 18 mg/dL (ref 6–20)
CALCIUM: 9.3 mg/dL (ref 8.9–10.3)
CO2: 26 mmol/L (ref 22–32)
CREATININE: 1.31 mg/dL — AB (ref 0.44–1.00)
Chloride: 102 mmol/L (ref 101–111)
GFR calc Af Amer: 49 mL/min — ABNORMAL LOW (ref >60)
GFR calc non Af Amer: 42 mL/min — ABNORMAL LOW (ref >60)
Glucose, Bld: 115 mg/dL — ABNORMAL HIGH (ref 65–99)
Potassium: 4.3 mmol/L (ref 3.5–5.1)
SODIUM: 134 mmol/L — AB (ref 135–145)
Total Bilirubin: 0.7 mg/dL (ref 0.3–1.2)
Total Protein: 7.2 g/dL (ref 6.5–8.1)

## 2014-11-29 LAB — CBC
HCT: 34.4 % — ABNORMAL LOW (ref 36.0–46.0)
Hemoglobin: 11.2 g/dL — ABNORMAL LOW (ref 12.0–15.0)
MCH: 30.3 pg (ref 26.0–34.0)
MCHC: 32.6 g/dL (ref 30.0–36.0)
MCV: 93 fL (ref 78.0–100.0)
PLATELETS: 337 10*3/uL (ref 150–400)
RBC: 3.7 MIL/uL — AB (ref 3.87–5.11)
RDW: 13.1 % (ref 11.5–15.5)
WBC: 7.9 10*3/uL (ref 4.0–10.5)

## 2014-11-29 LAB — IRON AND TIBC
IRON: 55 ug/dL (ref 28–170)
Saturation Ratios: 20 % (ref 10.4–31.8)
TIBC: 272 ug/dL (ref 250–450)
UIBC: 217 ug/dL

## 2014-11-29 LAB — FERRITIN: FERRITIN: 229 ng/mL (ref 11–307)

## 2014-11-29 LAB — TROPONIN I: Troponin I: <0.03 ng/mL (ref <0.031)

## 2014-11-29 LAB — FOLATE: Folate: 14 ng/mL (ref >5.9)

## 2014-11-29 LAB — RETICULOCYTES
RBC.: 3.7 MIL/uL — ABNORMAL LOW (ref 3.87–5.11)
RETIC COUNT ABSOLUTE: 44.4 10*3/uL (ref 19.0–186.0)
RETIC CT PCT: 1.2 % (ref 0.4–3.1)

## 2014-11-29 LAB — VITAMIN B12: VITAMIN B 12: 407 pg/mL (ref 180–914)

## 2014-11-29 MED ORDER — BENZONATATE 100 MG PO CAPS
100.0000 mg | ORAL_CAPSULE | Freq: Three times a day (TID) | ORAL | Status: DC | PRN
  Administered 2014-11-29 – 2014-11-30 (×2): 100 mg via ORAL
  Filled 2014-11-29 (×2): qty 1

## 2014-11-29 NOTE — Progress Notes (Signed)
PROGRESS NOTE  Erin Riley ZOX:[id number] DOB: 09-13-1950 DOA: 11/28/2014 PCP: Billee Cashing, MD  Assessment/Plan: Chest Pain - known CAD w/ 50% stenosis in the perforator of LAD per cath 2011 The patient's chest pain is atypical in many ways CE negative  bronchopneumonia  levaquin x 8 days  CKD stage 2 crt baseline appears to be approximately 1.2 with mildly elevated creatinine of 1.52 at presentation  Normocytic anemia Likely due to CKD -no sign of Fe def  PAD Denies claudication symptoms  HTN Reasonably well controlled at present though not ideal  Chronic back pain s/p lumbar surgery  Does not appear to be worse than her baseline pain  Obesity - Body mass index is 33.35 kg/(m^2).   Code Status: full Family Communication: patient Disposition Plan:    Consultants:    Procedures:      HPI/Subjective: Feeling some better  Objective: Filed Vitals:   11/29/14 0524  BP: 133/76  Pulse: 82  Temp: 98.4 F (36.9 C)  Resp: 20    Intake/Output Summary (Last 24 hours) at 11/29/14 0953 Last data filed at 11/29/14 0900  Gross per 24 hour  Intake 1426.67 ml  Output   1226 ml  Net 200.67 ml   Filed Weights   11/28/14 1430 11/28/14 1525 11/29/14 0524  Weight: 102.967 kg (227 lb) 101.606 kg (224 lb) 102.377 kg (225 lb 11.2 oz)    Exam:   General:  Alert, NAD  Cardiovascular: rrr  Respiratory: clear  Abdomen: +BS, soft  Musculoskeletal: no edema  Data Reviewed: Basic Metabolic Panel:  Recent Labs Lab 11/28/14 0532 11/29/14 0535  NA 137 134*  K 3.9 4.3  CL 103 102  CO2 25 26  GLUCOSE 122* 115*  BUN 17 18  CREATININE 1.52* 1.31*  CALCIUM 9.7 9.3   Liver Function Tests:  Recent Labs Lab 11/28/14 0532 11/29/14 0535  AST 21 16  ALT 15 12*  ALKPHOS 94 82  BILITOT 0.3 0.7  PROT 7.2 7.2  ALBUMIN 3.6 3.2*    Recent Labs Lab 11/28/14 0532  LIPASE 24   No results for input(s): AMMONIA in the last 168 hours. CBC:  Recent  Labs Lab 11/28/14 0532 11/29/14 0535  WBC 9.4 7.9  HGB 11.9* 11.2*  HCT 36.3 34.4*  MCV 92.1 93.0  PLT 372 337   Cardiac Enzymes:  Recent Labs Lab 11/28/14 0532  TROPONINI <0.03   BNP (last 3 results) No results for input(s): BNP in the last 8760 hours.  ProBNP (last 3 results) No results for input(s): PROBNP in the last 8760 hours.  CBG: No results for input(s): GLUCAP in the last 168 hours.  No results found for this or any previous visit (from the past 240 hour(s)).   Studies: Ct Abdomen Pelvis Wo Contrast  11/28/2014   CLINICAL DATA:  Chest/back/lower abdominal pain  EXAM: CT CHEST, ABDOMEN AND PELVIS WITHOUT CONTRAST  TECHNIQUE: Multidetector CT imaging of the chest, abdomen and pelvis was performed following the standard protocol without IV contrast.  COMPARISON:  CT chest dated 05/13/2012. CT abdomen pelvis dated 04/23/2010.  FINDINGS: CT CHEST FINDINGS  Mediastinum/Nodes: Heart is top-normal in size. No pericardial effusion.  Atherosclerotic calcifications of the aortic arch.  Small mediastinal lymph nodes which do not meet pathologic CT size criteria.  Visualized thyroid is mildly heterogeneous/ nodular.  Lungs/Pleura: Multifocal patchy tree-in-bud nodularity bilaterally, measuring up to 8 mm (series 3/image 12) in the posterior right upper lobe, suspicious for infection such as mild bronchopneumonia.  Mild  dependent atelectasis in the right lower lobe.  Mild centrilobular and paraseptal emphysematous changes.  No pleural effusion or pneumothorax.  Musculoskeletal: Degenerative changes the thoracic spine.  Right shoulder arthroplasty.  CT ABDOMEN PELVIS FINDINGS  Hepatobiliary: Unenhanced liver is unremarkable.  Gallbladder is unremarkable. No intrahepatic or extrahepatic ductal dilatation.  Pancreas: Within normal limits.  Spleen: Within normal limits.  Adrenals/Urinary Tract: Adrenal glands are mildly thickened but without discrete mass.  Kidneys are within normal limits.  No  renal, ureteral, or bladder calculi.  No hydronephrosis.  Bladder is within normal limits.  Stomach/Bowel: Stomach is within normal limits.  No evidence of bowel obstruction.  Normal appendix.  Colonic diverticulosis, without evidence of diverticulitis.  Vascular/Lymphatic: Atherosclerotic calcifications of the abdominal aorta and branch vessels.  No suspicious abdominopelvic lymphadenopathy.  Reproductive: Uterus is unremarkable.  Left ovary is within normal limits.  4.8 x 3.0 cm cystic right ovarian lesion, previously 4.8 x 2.7 cm, grossly unchanged since 2012.  Other: No abdominopelvic ascites.  Musculoskeletal: Degenerative changes of the lumbar spine. Status post PLIF at L4-S1.  IMPRESSION: Mild patchy tree-in-bud nodularity in the lungs bilaterally, suspicious for infection such as mild bronchopneumonia.  No evidence of bowel obstruction.  Normal appendix.  4.8 x 3.0 cm cystic right ovarian lesion, grossly unchanged since 2012, likely benign.   Electronically Signed   By: Charline Bills M.D.   On: 11/28/2014 08:19   Dg Chest 2 View  11/28/2014   CLINICAL DATA:  Chest pain come ache epigastric pain, and lower abdominal pain.  EXAM: CHEST  2 VIEW  COMPARISON:  09/25/2013  FINDINGS: Normal heart size and pulmonary vascularity. No focal airspace disease or consolidation in the lungs. No blunting of costophrenic angles. No pneumothorax. Mediastinal contours appear intact. Postoperative changes in the low right shoulder. Degenerative changes in the left shoulder and spine.  IMPRESSION: No active cardiopulmonary disease.   Electronically Signed   By: Burman Nieves M.D.   On: 11/28/2014 06:36   Ct Chest Wo Contrast  11/28/2014   CLINICAL DATA:  Chest/back/lower abdominal pain  EXAM: CT CHEST, ABDOMEN AND PELVIS WITHOUT CONTRAST  TECHNIQUE: Multidetector CT imaging of the chest, abdomen and pelvis was performed following the standard protocol without IV contrast.  COMPARISON:  CT chest dated 05/13/2012. CT  abdomen pelvis dated 04/23/2010.  FINDINGS: CT CHEST FINDINGS  Mediastinum/Nodes: Heart is top-normal in size. No pericardial effusion.  Atherosclerotic calcifications of the aortic arch.  Small mediastinal lymph nodes which do not meet pathologic CT size criteria.  Visualized thyroid is mildly heterogeneous/ nodular.  Lungs/Pleura: Multifocal patchy tree-in-bud nodularity bilaterally, measuring up to 8 mm (series 3/image 12) in the posterior right upper lobe, suspicious for infection such as mild bronchopneumonia.  Mild dependent atelectasis in the right lower lobe.  Mild centrilobular and paraseptal emphysematous changes.  No pleural effusion or pneumothorax.  Musculoskeletal: Degenerative changes the thoracic spine.  Right shoulder arthroplasty.  CT ABDOMEN PELVIS FINDINGS  Hepatobiliary: Unenhanced liver is unremarkable.  Gallbladder is unremarkable. No intrahepatic or extrahepatic ductal dilatation.  Pancreas: Within normal limits.  Spleen: Within normal limits.  Adrenals/Urinary Tract: Adrenal glands are mildly thickened but without discrete mass.  Kidneys are within normal limits.  No renal, ureteral, or bladder calculi.  No hydronephrosis.  Bladder is within normal limits.  Stomach/Bowel: Stomach is within normal limits.  No evidence of bowel obstruction.  Normal appendix.  Colonic diverticulosis, without evidence of diverticulitis.  Vascular/Lymphatic: Atherosclerotic calcifications of the abdominal aorta and branch vessels.  No suspicious abdominopelvic lymphadenopathy.  Reproductive: Uterus is unremarkable.  Left ovary is within normal limits.  4.8 x 3.0 cm cystic right ovarian lesion, previously 4.8 x 2.7 cm, grossly unchanged since 2012.  Other: No abdominopelvic ascites.  Musculoskeletal: Degenerative changes of the lumbar spine. Status post PLIF at L4-S1.  IMPRESSION: Mild patchy tree-in-bud nodularity in the lungs bilaterally, suspicious for infection such as mild bronchopneumonia.  No evidence of  bowel obstruction.  Normal appendix.  4.8 x 3.0 cm cystic right ovarian lesion, grossly unchanged since 2012, likely benign.   Electronically Signed   By: Charline Bills M.D.   On: 11/28/2014 08:19    Scheduled Meds: . amLODipine  10 mg Oral Daily  . aspirin EC  81 mg Oral Daily  . atorvastatin  40 mg Oral Daily  . carvedilol  25 mg Oral BID WC  . digoxin  125 mcg Oral Daily  . enoxaparin (LOVENOX) injection  40 mg Subcutaneous Q24H  . hydrALAZINE  50 mg Oral 3 times per day  . isosorbide mononitrate  90 mg Oral Daily  . levofloxacin  750 mg Oral Daily  . pantoprazole  40 mg Oral Daily  . pneumococcal 23 valent vaccine  0.5 mL Intramuscular Tomorrow-1000  . sucralfate  1 g Oral QID   Continuous Infusions: . sodium chloride 50 mL/hr at 11/28/14 1700   Antibiotics Given (last 72 hours)    None      Active Problems:   Chest pain   Essential hypertension   CKD (chronic kidney disease) stage 3, GFR 30-59 ml/min   Obesity (BMI 30-39.9)   Hyperlipidemia with target LDL less than 100   HCAP (healthcare-associated pneumonia)   Chest pain at rest    Time spent: 25 min    [facility], JESSICA  [facility] Pager [phone]. If 7PM-7AM, please contact night-coverage at [email or web address], password [facility] 11/29/2014, 9:53 AM  LOS: 1 day

## 2014-11-29 NOTE — Progress Notes (Signed)
Utilization review completed.  

## 2014-11-30 DIAGNOSIS — J189 Pneumonia, unspecified organism: Secondary | ICD-10-CM

## 2014-11-30 DIAGNOSIS — J180 Bronchopneumonia, unspecified organism: Secondary | ICD-10-CM | POA: Diagnosis not present

## 2014-11-30 LAB — BASIC METABOLIC PANEL
ANION GAP: 6 (ref 5–15)
BUN: 18 mg/dL (ref 6–20)
CALCIUM: 9.4 mg/dL (ref 8.9–10.3)
CO2: 24 mmol/L (ref 22–32)
CREATININE: 1.31 mg/dL — AB (ref 0.44–1.00)
Chloride: 108 mmol/L (ref 101–111)
GFR, EST AFRICAN AMERICAN: 49 mL/min — AB (ref >60)
GFR, EST NON AFRICAN AMERICAN: 42 mL/min — AB (ref >60)
Glucose, Bld: 125 mg/dL — ABNORMAL HIGH (ref 65–99)
Potassium: 3.9 mmol/L (ref 3.5–5.1)
SODIUM: 138 mmol/L (ref 135–145)

## 2014-11-30 LAB — CBC
HCT: 31.8 % — ABNORMAL LOW (ref 36.0–46.0)
Hemoglobin: 10.1 g/dL — ABNORMAL LOW (ref 12.0–15.0)
MCH: 29.2 pg (ref 26.0–34.0)
MCHC: 31.8 g/dL (ref 30.0–36.0)
MCV: 91.9 fL (ref 78.0–100.0)
PLATELETS: 290 10*3/uL (ref 150–400)
RBC: 3.46 MIL/uL — ABNORMAL LOW (ref 3.87–5.11)
RDW: 13.2 % (ref 11.5–15.5)
WBC: 6.7 10*3/uL (ref 4.0–10.5)

## 2014-11-30 MED ORDER — BENZONATATE 100 MG PO CAPS
100.0000 mg | ORAL_CAPSULE | Freq: Three times a day (TID) | ORAL | Status: DC | PRN
Start: 2014-11-30 — End: 2015-01-18

## 2014-11-30 MED ORDER — INFLUENZA VAC SPLIT QUAD 0.5 ML IM SUSY: 0.5000 mL | PREFILLED_SYRINGE | INTRAMUSCULAR | Status: DC

## 2014-11-30 MED ORDER — INFLUENZA VAC SPLIT QUAD 0.5 ML IM SUSY
0.5000 mL | PREFILLED_SYRINGE | INTRAMUSCULAR | Status: AC
  Administered 2014-11-30: 0.5 mL via INTRAMUSCULAR
  Filled 2014-11-30: qty 0.5

## 2014-11-30 MED ORDER — LEVOFLOXACIN 750 MG PO TABS: 750.0000 mg | ORAL_TABLET | Freq: Every day | ORAL | Status: DC

## 2014-11-30 NOTE — Clinical Documentation Improvement (Signed)
Hospitalist  A cause and effect relationship may not be assumed and must be documented by a provider.  Please clarify the relationship, if any, between "atypical chest pain" and "bronchopneumonia".  Are the conditions:   Due to or associated with each other  Unrelated to each other  Other  Clinically Undetermined   Supporting Information (risk factors, sign and symptoms, diagnostics, treatment): -- bronchopneumonia per CXR with cough, admitted with "atypical chest pain" with negative enzymes being treated with IVABX  Please exercise your independent, professional judgment when responding. A specific answer is not anticipated or expected.   Thank You,  Beverley Fiedler RN [facility] [employer] [facility] [place] [phone]

## 2014-11-30 NOTE — Discharge Summary (Addendum)
Physician Discharge Summary  Erin Riley AVW:[id number] DOB: [birth year] DOA: 11/28/2014  PCP: Billee Cashing, MD  Admit date: 11/28/2014 Discharge date: 11/30/2014  Time spent: 35 minutes  Recommendations for Outpatient Follow-up:  1. Outpatient cardiology follow up  Discharge Diagnoses:  Active Problems:   Chest pain   Essential hypertension   CKD (chronic kidney disease) stage 3, GFR 30-59 ml/min   Obesity (BMI 30-39.9)   Hyperlipidemia with target LDL less than 100   Chest pain at rest   CAP (community acquired pneumonia)   Discharge Condition: improved  Diet recommendation: cardiac  Filed Weights   11/28/14 1525 11/29/14 0524 11/30/14 0435  Weight: 101.606 kg (224 lb) 102.377 kg (225 lb 11.2 oz) 106.232 kg (234 lb 3.2 oz)    History of present illness:  64 y.o. female w/ a hx of CAD, diastolic CHF, PAD, HLD, CKD stage 2, HTN, and hypertensive hypertrophic CM who presented to the ED c/o chest and epigastric pain. This pain started abruptly while the pt was at rest. There were no particular exacerbating or alleviating factors. The pain is described as a pressure type sensation. Though she states it was in her chest on exam it appears to be primarily epigastric. She denies nausea vomiting or diarrhea but does admit to constipation. She admits to some shortness of breath intermittently associated with this pain but not necessarily associated with exertion. She states "I think him coming down with something" because she has been expectorating a modest amount of thickish whitish to tan sputum. She "felt feverish last night" but did not measure her temperature and she denies having had chills.  In the emergency room a plain film of the chest was obtained which was unrevealing. Given that some of her pain was also located in the back CT scan of chest and abdomen was accomplished to rule out dissection. There was no evidence of dissection but there was a suggestion of a  possible early bronchopneumonia. Laboratory studies have been primarily unremarkable. EKG is a poor quality study but does suggest the possibility of ST changes in the inferior leads.   Hospital Course:  Atypical CP due to  bronchopneumonia  levaquin x 5 days  CKD stage 2 crt baseline appears to be approximately 1.2 with mildly elevated creatinine of 1.52 at presentation  Normocytic anemia Likely due to CKD -no sign of Fe def  PAD Denies claudication symptoms  HTN Reasonably well controlled at present though not ideal  Chronic back pain s/p lumbar surgery  Does not appear to be worse than her baseline pain  Obesity - Body mass index is 33.35 kg/(m^2).   Procedures:      Discharge Exam: Filed Vitals:   11/30/14 0800  BP: 134/71  Pulse: 70  Temp: 97.6 F (36.4 C)  Resp: 18    General: feeling much better Cardiovascular: rrr Respiratory: clear  Discharge Instructions   Discharge Instructions    Diet - low sodium heart healthy    Complete by:  As directed      Increase activity slowly    Complete by:  As directed           Discharge Medication List as of 11/30/2014 11:54 AM    START taking these medications   Details  benzonatate (TESSALON) 100 MG capsule Take 1 capsule (100 mg total) by mouth 3 (three) times daily as needed for cough., Starting 11/30/2014, Until Discontinued, Print    levofloxacin (LEVAQUIN) 750 MG tablet Take 1 tablet (750 mg total)  by mouth daily., Starting 11/30/2014, Until Discontinued, Print      CONTINUE these medications which have NOT CHANGED   Details  amLODipine (NORVASC) 10 MG tablet take 1 tablet by mouth once daily, Normal    aspirin EC 81 MG tablet Take 81 mg by mouth daily., Until Discontinued, Historical Med    atorvastatin (LIPITOR) 40 MG tablet Take 1 tablet (40 mg total) by mouth daily., Starting 06/08/2014, Until Discontinued, Normal    carvedilol (COREG) 25 MG tablet Take 1 tablet (25 mg total) by mouth 2 (two)  times daily with a meal., Starting 03/13/2014, Until Discontinued, Normal    DIGITEK 125 MCG tablet take 1 tablet by mouth once daily, Normal    hydrALAZINE (APRESOLINE) 50 MG tablet take 1 tablet by mouth three times a day, Normal    isosorbide mononitrate (IMDUR) 60 MG 24 hr tablet take 1 1/2 tablets by mouth once daily, Normal    methocarbamol (ROBAXIN) 500 MG tablet Take 1 tablet (500 mg total) by mouth 3 (three) times daily as needed for muscle spasms (back spasm)., Starting 09/28/2014, Until Discontinued, Normal    naproxen (NAPROSYN) 500 MG tablet Take 1 tablet (500 mg total) by mouth 2 (two) times daily., Starting 11/25/2014, Until Discontinued, Print    omeprazole (PRILOSEC) 20 MG capsule Take 20 mg by mouth daily. , Starting 09/22/2013, Until Discontinued, Historical Med    oxyCODONE-acetaminophen (PERCOCET/ROXICET) 5-325 MG per tablet Take 1-2 tablets by mouth every 6 (six) hours as needed for moderate pain., Starting 09/28/2014, Until Discontinued, Print    spironolactone (ALDACTONE) 25 MG tablet take 1/2 tablet by mouth every morning, Normal    sucralfate (CARAFATE) 1 G tablet Take 1 g by mouth 4 (four) times daily., Starting 09/25/2013, Until Discontinued, Historical Med       No Known Allergies Follow-up Information    Follow up with Billee Cashing, MD In 1 week.   Specialty:  Family Medicine   Contact information:   [address] [place] [address] [phone]        The results of significant diagnostics from this hospitalization (including imaging, microbiology, ancillary and laboratory) are listed below for reference.    Significant Diagnostic Studies: Ct Abdomen Pelvis Wo Contrast  11/28/2014   CLINICAL DATA:  Chest/back/lower abdominal pain  EXAM: CT CHEST, ABDOMEN AND PELVIS WITHOUT CONTRAST  TECHNIQUE: Multidetector CT imaging of the chest, abdomen and pelvis was performed following the standard protocol without IV contrast.  COMPARISON:  CT chest  dated 05/13/2012. CT abdomen pelvis dated 04/23/2010.  FINDINGS: CT CHEST FINDINGS  Mediastinum/Nodes: Heart is top-normal in size. No pericardial effusion.  Atherosclerotic calcifications of the aortic arch.  Small mediastinal lymph nodes which do not meet pathologic CT size criteria.  Visualized thyroid is mildly heterogeneous/ nodular.  Lungs/Pleura: Multifocal patchy tree-in-bud nodularity bilaterally, measuring up to 8 mm (series 3/image 12) in the posterior right upper lobe, suspicious for infection such as mild bronchopneumonia.  Mild dependent atelectasis in the right lower lobe.  Mild centrilobular and paraseptal emphysematous changes.  No pleural effusion or pneumothorax.  Musculoskeletal: Degenerative changes the thoracic spine.  Right shoulder arthroplasty.  CT ABDOMEN PELVIS FINDINGS  Hepatobiliary: Unenhanced liver is unremarkable.  Gallbladder is unremarkable. No intrahepatic or extrahepatic ductal dilatation.  Pancreas: Within normal limits.  Spleen: Within normal limits.  Adrenals/Urinary Tract: Adrenal glands are mildly thickened but without discrete mass.  Kidneys are within normal limits.  No renal, ureteral, or bladder calculi.  No hydronephrosis.  Bladder is within normal limits.  Stomach/Bowel: Stomach is within normal limits.  No evidence of bowel obstruction.  Normal appendix.  Colonic diverticulosis, without evidence of diverticulitis.  Vascular/Lymphatic: Atherosclerotic calcifications of the abdominal aorta and branch vessels.  No suspicious abdominopelvic lymphadenopathy.  Reproductive: Uterus is unremarkable.  Left ovary is within normal limits.  4.8 x 3.0 cm cystic right ovarian lesion, previously 4.8 x 2.7 cm, grossly unchanged since 2012.  Other: No abdominopelvic ascites.  Musculoskeletal: Degenerative changes of the lumbar spine. Status post PLIF at L4-S1.  IMPRESSION: Mild patchy tree-in-bud nodularity in the lungs bilaterally, suspicious for infection such as mild  bronchopneumonia.  No evidence of bowel obstruction.  Normal appendix.  4.8 x 3.0 cm cystic right ovarian lesion, grossly unchanged since 2012, likely benign.   Electronically Signed   By: Charline Bills M.D.   On: 11/28/2014 08:19   Dg Chest 2 View  11/28/2014   CLINICAL DATA:  Chest pain come ache epigastric pain, and lower abdominal pain.  EXAM: CHEST  2 VIEW  COMPARISON:  09/25/2013  FINDINGS: Normal heart size and pulmonary vascularity. No focal airspace disease or consolidation in the lungs. No blunting of costophrenic angles. No pneumothorax. Mediastinal contours appear intact. Postoperative changes in the low right shoulder. Degenerative changes in the left shoulder and spine.  IMPRESSION: No active cardiopulmonary disease.   Electronically Signed   By: Burman Nieves M.D.   On: 11/28/2014 06:36   Ct Chest Wo Contrast  11/28/2014   CLINICAL DATA:  Chest/back/lower abdominal pain  EXAM: CT CHEST, ABDOMEN AND PELVIS WITHOUT CONTRAST  TECHNIQUE: Multidetector CT imaging of the chest, abdomen and pelvis was performed following the standard protocol without IV contrast.  COMPARISON:  CT chest dated 05/13/2012. CT abdomen pelvis dated 04/23/2010.  FINDINGS: CT CHEST FINDINGS  Mediastinum/Nodes: Heart is top-normal in size. No pericardial effusion.  Atherosclerotic calcifications of the aortic arch.  Small mediastinal lymph nodes which do not meet pathologic CT size criteria.  Visualized thyroid is mildly heterogeneous/ nodular.  Lungs/Pleura: Multifocal patchy tree-in-bud nodularity bilaterally, measuring up to 8 mm (series 3/image 12) in the posterior right upper lobe, suspicious for infection such as mild bronchopneumonia.  Mild dependent atelectasis in the right lower lobe.  Mild centrilobular and paraseptal emphysematous changes.  No pleural effusion or pneumothorax.  Musculoskeletal: Degenerative changes the thoracic spine.  Right shoulder arthroplasty.  CT ABDOMEN PELVIS FINDINGS  Hepatobiliary:  Unenhanced liver is unremarkable.  Gallbladder is unremarkable. No intrahepatic or extrahepatic ductal dilatation.  Pancreas: Within normal limits.  Spleen: Within normal limits.  Adrenals/Urinary Tract: Adrenal glands are mildly thickened but without discrete mass.  Kidneys are within normal limits.  No renal, ureteral, or bladder calculi.  No hydronephrosis.  Bladder is within normal limits.  Stomach/Bowel: Stomach is within normal limits.  No evidence of bowel obstruction.  Normal appendix.  Colonic diverticulosis, without evidence of diverticulitis.  Vascular/Lymphatic: Atherosclerotic calcifications of the abdominal aorta and branch vessels.  No suspicious abdominopelvic lymphadenopathy.  Reproductive: Uterus is unremarkable.  Left ovary is within normal limits.  4.8 x 3.0 cm cystic right ovarian lesion, previously 4.8 x 2.7 cm, grossly unchanged since 2012.  Other: No abdominopelvic ascites.  Musculoskeletal: Degenerative changes of the lumbar spine. Status post PLIF at L4-S1.  IMPRESSION: Mild patchy tree-in-bud nodularity in the lungs bilaterally, suspicious for infection such as mild bronchopneumonia.  No evidence of bowel obstruction.  Normal appendix.  4.8 x 3.0 cm cystic right ovarian lesion, grossly unchanged since 2012,  likely benign.   Electronically Signed   By: Charline Bills M.D.   On: 11/28/2014 08:19    Microbiology: Recent Results (from the past 240 hour(s))  Culture, blood (routine x 2)     Status: None (Preliminary result)   Collection Time: 11/28/14  9:05 AM  Result Value Ref Range Status   Specimen Description BLOOD LEFT ARM  Final   Special Requests BOTTLES DRAWN AEROBIC AND ANAEROBIC 3CC  Final   Culture NO GROWTH 1 DAY  Final   Report Status PENDING  Incomplete  Culture, blood (routine x 2)     Status: None (Preliminary result)   Collection Time: 11/28/14  9:10 AM  Result Value Ref Range Status   Specimen Description BLOOD LEFT HAND  Final   Special Requests BOTTLES  DRAWN AEROBIC AND ANAEROBIC 5CC  Final   Culture NO GROWTH 1 DAY  Final   Report Status PENDING  Incomplete     Labs: Basic Metabolic Panel:  Recent Labs Lab 11/28/14 0532 11/29/14 0535 11/30/14 0240  NA 137 134* 138  K 3.9 4.3 3.9  CL 103 102 108  CO2 25 26 24   GLUCOSE 122* 115* 125*  BUN 17 18 18   CREATININE 1.52* 1.31* 1.31*  CALCIUM 9.7 9.3 9.4   Liver Function Tests:  Recent Labs Lab 11/28/14 0532 11/29/14 0535  AST 21 16  ALT 15 12*  ALKPHOS 94 82  BILITOT 0.3 0.7  PROT 7.2 7.2  ALBUMIN 3.6 3.2*    Recent Labs Lab 11/28/14 0532  LIPASE 24   No results for input(s): AMMONIA in the last 168 hours. CBC:  Recent Labs Lab 11/28/14 0532 11/29/14 0535 11/30/14 0240  WBC 9.4 7.9 6.7  HGB 11.9* 11.2* 10.1*  HCT 36.3 34.4* 31.8*  MCV 92.1 93.0 91.9  PLT 372 337 290   Cardiac Enzymes:  Recent Labs Lab 11/28/14 0532 11/29/14 1044 11/29/14 1530 11/29/14 2203  TROPONINI <0.03 <0.03 <0.03 <0.03   BNP: BNP (last 3 results) No results for input(s): BNP in the last 8760 hours.  ProBNP (last 3 results) No results for input(s): PROBNP in the last 8760 hours.  CBG: No results for input(s): GLUCAP in the last 168 hours.     SignedMarlin Canary  [facility] 11/30/2014, 12:09 PM

## 2014-12-03 LAB — CULTURE, BLOOD (ROUTINE X 2)
Culture: NO GROWTH
Culture: NO GROWTH

## 2014-12-07 ENCOUNTER — Ambulatory Visit: Payer: Medicare Other | Admitting: Cardiology

## 2014-12-13 ENCOUNTER — Emergency Department (HOSPITAL_COMMUNITY)
Admission: EM | Admit: 2014-12-13 | Discharge: 2014-12-13 | Disposition: A | Discharge status: Home / Self Care | Payer: Medicare Other | Attending: Emergency Medicine | Admitting: Emergency Medicine

## 2014-12-13 ENCOUNTER — Encounter (HOSPITAL_COMMUNITY): Payer: Self-pay | Admitting: Emergency Medicine

## 2014-12-13 ENCOUNTER — Emergency Department (HOSPITAL_COMMUNITY): Payer: Medicare Other

## 2014-12-13 DIAGNOSIS — E785 Hyperlipidemia, unspecified: Secondary | ICD-10-CM | POA: Diagnosis not present

## 2014-12-13 DIAGNOSIS — I2510 Atherosclerotic heart disease of native coronary artery without angina pectoris: Secondary | ICD-10-CM | POA: Diagnosis not present

## 2014-12-13 DIAGNOSIS — I129 Hypertensive chronic kidney disease with stage 1 through stage 4 chronic kidney disease, or unspecified chronic kidney disease: Secondary | ICD-10-CM | POA: Diagnosis not present

## 2014-12-13 DIAGNOSIS — Z87891 Personal history of nicotine dependence: Secondary | ICD-10-CM | POA: Insufficient documentation

## 2014-12-13 DIAGNOSIS — G8929 Other chronic pain: Secondary | ICD-10-CM | POA: Insufficient documentation

## 2014-12-13 DIAGNOSIS — Z791 Long term (current) use of non-steroidal anti-inflammatories (NSAID): Secondary | ICD-10-CM | POA: Insufficient documentation

## 2014-12-13 DIAGNOSIS — N182 Chronic kidney disease, stage 2 (mild): Secondary | ICD-10-CM | POA: Insufficient documentation

## 2014-12-13 DIAGNOSIS — Z3202 Encounter for pregnancy test, result negative: Secondary | ICD-10-CM | POA: Insufficient documentation

## 2014-12-13 DIAGNOSIS — R109 Unspecified abdominal pain: Secondary | ICD-10-CM | POA: Diagnosis not present

## 2014-12-13 DIAGNOSIS — R011 Cardiac murmur, unspecified: Secondary | ICD-10-CM | POA: Insufficient documentation

## 2014-12-13 DIAGNOSIS — F209 Schizophrenia, unspecified: Secondary | ICD-10-CM | POA: Insufficient documentation

## 2014-12-13 DIAGNOSIS — Z7982 Long term (current) use of aspirin: Secondary | ICD-10-CM | POA: Insufficient documentation

## 2014-12-13 DIAGNOSIS — M545 Low back pain: Secondary | ICD-10-CM | POA: Diagnosis not present

## 2014-12-13 DIAGNOSIS — Z79899 Other long term (current) drug therapy: Secondary | ICD-10-CM | POA: Diagnosis not present

## 2014-12-13 DIAGNOSIS — M549 Dorsalgia, unspecified: Secondary | ICD-10-CM

## 2014-12-13 LAB — URINALYSIS, ROUTINE W REFLEX MICROSCOPIC
BILIRUBIN URINE: NEGATIVE
Glucose, UA: NEGATIVE mg/dL
Hgb urine dipstick: NEGATIVE
Ketones, ur: NEGATIVE mg/dL
Leukocytes, UA: NEGATIVE
NITRITE: NEGATIVE
PROTEIN: NEGATIVE mg/dL
UROBILINOGEN UA: 0.2 mg/dL (ref 0.0–1.0)
pH: 5.5 (ref 5.0–8.0)

## 2014-12-13 LAB — PREGNANCY, URINE: PREG TEST UR: NEGATIVE

## 2014-12-13 MED ORDER — TRAMADOL HCL 50 MG PO TABS: 50.0000 mg | ORAL_TABLET | Freq: Four times a day (QID) | ORAL | Status: DC | PRN

## 2014-12-13 NOTE — ED Provider Notes (Signed)
CSN: [id number]     Arrival date & time 12/13/14  1243 History   First MD Initiated Contact with Patient 12/13/14 1252     Chief Complaint  Patient presents with  . Back Pain  . Abdominal Pain  . Flank Pain      HPI Pt c/o intermittent back and abdominal pain x 2-3 days increasing today. Pt points to left flank area when asked where pain is. Pt reports increase urination with only small amounts of urine. Pt denies N/V. Past Medical History  Diagnosis Date  . Hypertension   . Left ventricular diastolic dysfunction, NYHA class 1 March 20    Severe concentric LVH (likely hypertensive), mild aortic sclerosis. Grade 1 diastolic dysfunction  . Coronary artery disease 04/2009    50% stenosis in the perforator of LAD; catheterization was for an abnormal Myoview in January 2000 showing anterior and inferolateral ischemia.  [name] PAD (peripheral artery disease) [facility]) March 2013    Lower extremity Dopplers: R. SFA 50-60%, R. PTA proximally occluded with distal reconstitution;; L. common iliac ~50%, L. SFA 50-70% stenosis, L. PTA < 50%  . Hyperlipidemia   . Chronic kidney disease (CKD), stage II (mild)     Class I-II  . History of schizophrenia     However I am not sure about the validity of this. She is not on any medications.  . Chronic back pain   . 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 At least 3 subsequent echocardiograms have shown improved/normal EF with moderate to severe concentric LVH and diastolic dysfunction with LVOT/intracavitary gradient  . Heart murmur     Previously reported moderate to severe MR. Most recent echo in May 2014 revealed mild MR.  [name] Hypertensive hypertrophic cardiomyopathy: NYHA class II:  Echo: Severe concentric LVH with LV OT gradient; essentially preserved EF with diastolic dysfunction 02/15/2013    Not significantly symptomatic;  echocardiogram from May 2014: Severe concentric LVH with EF 50-55%. Somewhat unbelievably grade 1 diastolic dysfunction with moderate LE dilation. Trivial MR noted.   Past Surgical History  Procedure Laterality Date  . Bunionectomy    . Nm myocar perf wall motion  03/2009    Persantine; EF 51%-both anterior and inferolateral ischemia  . Lower extremity doppler  05/29/2011    right SFA 50% to 59% diameter reduction,right posterior tibal atreery occlusive disease,reconstituting distally, left common illiac<50%,left SFA 50 to70%,left post. tibial <50%  . Doppler echocardiography  May 2014    EF 50-55%; severe concentric LVH; only grade 1 diastolic dysfunction. Mild aortic sclerosis - with LVOT /intracavitary gradient of roughly 20 mmHg mean. Mild to moderately dilated LA;; previously reported MR not seen  . Doppler echocardiography  02/02/2009    mod to severe regurg mitral, EF 20 TO 25%, tricuspid severe regurg. ,pulmonic moderate regurg.  . Cardiac catheterization  04/09/2009    rt and lt cath -non obstructive coronary disease   . Carotid doppler  05/29/2011    left bulb/prox ICA moderate amtfibrous plaque with no evidence significant reduction.,right bulb /proximal ICA normal patency   Family History  Problem Relation Age of Onset  . Hypertension Mother    Social History  Substance Use Topics  . Smoking status: Former Smoker    Quit date: 05/14/2002  . Smokeless tobacco: Never Used  . Alcohol Use: No   OB History    No data available  Review of Systems  All other systems reviewed and are negative.     Allergies  Review of patient's allergies indicates no known allergies.  Home Medications   Prior to Admission medications   Medication Sig Start Date End Date Taking? Authorizing Provider  amLODipine (NORVASC) 10 MG tablet take 1 tablet by mouth once daily 03/13/14  Yes Marykay Lex, MD  aspirin EC 81 MG tablet Take 81 mg by mouth daily.   Yes Historical Provider, MD   atorvastatin (LIPITOR) 40 MG tablet Take 1 tablet (40 mg total) by mouth daily. 06/08/14  Yes Marykay Lex, MD  benzonatate (TESSALON) 100 MG capsule Take 1 capsule (100 mg total) by mouth 3 (three) times daily as needed for cough. 11/30/14  Yes Joseph Art, DO  carvedilol (COREG) 25 MG tablet Take 1 tablet (25 mg total) by mouth 2 (two) times daily with a meal. 03/13/14  Yes Marykay Lex, MD  DIGITEK 125 MCG tablet take 1 tablet by mouth once daily 03/13/14  Yes Marykay Lex, MD  hydrALAZINE (APRESOLINE) 50 MG tablet take 1 tablet by mouth three times a day 03/13/14  Yes Marykay Lex, MD  isosorbide mononitrate (IMDUR) 60 MG 24 hr tablet take 1 1/2 tablets by mouth once daily 03/13/14  Yes Marykay Lex, MD  methocarbamol (ROBAXIN) 500 MG tablet Take 1 tablet (500 mg total) by mouth 3 (three) times daily as needed for muscle spasms (back spasm). 09/28/14  Yes Tia Alert, MD  naproxen (NAPROSYN) 500 MG tablet Take 1 tablet (500 mg total) by mouth 2 (two) times daily. 11/25/14  Yes Felicie Morn, NP  omeprazole (PRILOSEC) 20 MG capsule Take 20 mg by mouth daily.  09/22/13  Yes Historical Provider, MD  oxyCODONE-acetaminophen (PERCOCET/ROXICET) 5-325 MG per tablet Take 1-2 tablets by mouth every 6 (six) hours as needed for moderate pain. 09/28/14  Yes Tia Alert, MD  spironolactone (ALDACTONE) 25 MG tablet take 1/2 tablet by mouth every morning 03/13/14  Yes Marykay Lex, MD  sucralfate (CARAFATE) 1 G tablet Take 1 g by mouth 4 (four) times daily. 09/25/13  Yes Rolland Porter, MD  levofloxacin (LEVAQUIN) 750 MG tablet Take 1 tablet (750 mg total) by mouth daily. 11/30/14   Joseph Art, DO  traMADol (ULTRAM) 50 MG tablet Take 1 tablet (50 mg total) by mouth every 6 (six) hours as needed. 12/13/14   Nelva Nay, MD   BP 124/69 mmHg  Pulse 85  Temp(Src) 98 F (36.7 C) (Oral)  Resp 15  Ht  (1.727 m)  Wt 215 lb (97.523 kg)  BMI 32.70 kg/m2  SpO2 100%  LMP 12/11/2011 Physical  Exam Physical Exam  Nursing note and vitals reviewed. Constitutional: She is oriented to person, place, and time. She appears well-developed and well-nourished. No distress.  HENT:  Head: Normocephalic and atraumatic.  Eyes: Pupils are equal, round, and reactive to light.  Neck: Normal range of motion.  Cardiovascular: Normal rate and intact distal pulses.   Pulmonary/Chest: No respiratory distress.  Abdominal: Normal appearance. She exhibits no distension.  Musculoskeletal: Normal range of motion.  patient has paraspinous sciatic lumbar tenderness on the right.  This pain increases with palpation. Neurological: She is alert and oriented to person, place, and time. No cranial nerve deficit.  no weakness or peripheral numbness noted. Skin: Skin is warm and dry. No rash noted.  Psychiatric: She has a normal mood and affect. Her behavior is normal.  ED Course  Procedures (including critical care time) Labs Review Labs Reviewed  URINALYSIS, ROUTINE W REFLEX MICROSCOPIC (NOT AT [facility]) - Abnormal; Notable for the following:    Specific Gravity, Urine >1.030 (*)    All other components within normal limits  PREGNANCY, URINE    Imaging Review No results found. Results for orders placed or performed during the hospital encounter of 12/13/14  Urinalysis, Routine w reflex microscopic-may I&O cath if menses (not at [facility])  Result Value Ref Range   Color, Urine YELLOW YELLOW   APPearance CLEAR CLEAR   Specific Gravity, Urine >1.030 (H) 1.005 - 1.030   pH 5.5 5.0 - 8.0   Glucose, UA NEGATIVE NEGATIVE mg/dL   Hgb urine dipstick NEGATIVE NEGATIVE   Bilirubin Urine NEGATIVE NEGATIVE   Ketones, ur NEGATIVE NEGATIVE mg/dL   Protein, ur NEGATIVE NEGATIVE mg/dL   Urobilinogen, UA 0.2 0.0 - 1.0 mg/dL   Nitrite NEGATIVE NEGATIVE   Leukocytes, UA NEGATIVE NEGATIVE  Pregnancy, urine  Result Value Ref Range   Preg Test, Ur NEGATIVE NEGATIVE     .   EKG Interpretation None      MDM    Final diagnoses:  Chronic back pain        Nelva Nay, MD 12/18/14 1229

## 2014-12-13 NOTE — ED Notes (Signed)
Patient transported to X-ray 

## 2014-12-13 NOTE — ED Notes (Signed)
Please note that I ordered the urine pregnancy on this pt as this was per pt insistence.  Pt was taken to radiology where per radiology department she told them she would like a Urine pregnancy test prior to xray.  Her LMP was last year and I asked her if she thought she may be pregnant to which she replied "yes".

## 2014-12-13 NOTE — Discharge Instructions (Signed)

## 2014-12-13 NOTE — ED Notes (Addendum)
Pt c/o intermittent back and abdominal pain x 2-3 days increasing today. Pt points to left flank area when asked where pain is. Pt reports increase urination with only small amounts of urine. Pt denies N/V.

## 2014-12-13 NOTE — ED Notes (Addendum)
Pt is alert and oriented, resting.  Pt reports back pain is sharp pain in lower back.

## 2014-12-28 ENCOUNTER — Emergency Department (HOSPITAL_COMMUNITY)
Admission: EM | Admit: 2014-12-28 | Discharge: 2014-12-28 | Disposition: A | Discharge status: Home / Self Care | Payer: Medicare Other | Attending: Emergency Medicine | Admitting: Emergency Medicine

## 2014-12-28 ENCOUNTER — Emergency Department (HOSPITAL_COMMUNITY): Payer: Medicare Other

## 2014-12-28 ENCOUNTER — Encounter (HOSPITAL_COMMUNITY): Payer: Self-pay | Admitting: Emergency Medicine

## 2014-12-28 DIAGNOSIS — R011 Cardiac murmur, unspecified: Secondary | ICD-10-CM | POA: Diagnosis not present

## 2014-12-28 DIAGNOSIS — F209 Schizophrenia, unspecified: Secondary | ICD-10-CM | POA: Diagnosis not present

## 2014-12-28 DIAGNOSIS — N182 Chronic kidney disease, stage 2 (mild): Secondary | ICD-10-CM | POA: Diagnosis not present

## 2014-12-28 DIAGNOSIS — Z791 Long term (current) use of non-steroidal anti-inflammatories (NSAID): Secondary | ICD-10-CM | POA: Diagnosis not present

## 2014-12-28 DIAGNOSIS — Z87891 Personal history of nicotine dependence: Secondary | ICD-10-CM | POA: Insufficient documentation

## 2014-12-28 DIAGNOSIS — Z3202 Encounter for pregnancy test, result negative: Secondary | ICD-10-CM | POA: Insufficient documentation

## 2014-12-28 DIAGNOSIS — R112 Nausea with vomiting, unspecified: Secondary | ICD-10-CM | POA: Diagnosis not present

## 2014-12-28 DIAGNOSIS — I2510 Atherosclerotic heart disease of native coronary artery without angina pectoris: Secondary | ICD-10-CM | POA: Diagnosis not present

## 2014-12-28 DIAGNOSIS — E785 Hyperlipidemia, unspecified: Secondary | ICD-10-CM | POA: Insufficient documentation

## 2014-12-28 DIAGNOSIS — R109 Unspecified abdominal pain: Secondary | ICD-10-CM | POA: Insufficient documentation

## 2014-12-28 DIAGNOSIS — I129 Hypertensive chronic kidney disease with stage 1 through stage 4 chronic kidney disease, or unspecified chronic kidney disease: Secondary | ICD-10-CM | POA: Insufficient documentation

## 2014-12-28 DIAGNOSIS — Z79899 Other long term (current) drug therapy: Secondary | ICD-10-CM | POA: Insufficient documentation

## 2014-12-28 DIAGNOSIS — Z7982 Long term (current) use of aspirin: Secondary | ICD-10-CM | POA: Diagnosis not present

## 2014-12-28 DIAGNOSIS — R079 Chest pain, unspecified: Secondary | ICD-10-CM | POA: Diagnosis present

## 2014-12-28 DIAGNOSIS — G8929 Other chronic pain: Secondary | ICD-10-CM | POA: Insufficient documentation

## 2014-12-28 LAB — COMPREHENSIVE METABOLIC PANEL
ALK PHOS: 77 U/L (ref 38–126)
ALT: 19 U/L (ref 14–54)
AST: 26 U/L (ref 15–41)
Albumin: 3.5 g/dL (ref 3.5–5.0)
Anion gap: 8 (ref 5–15)
BILIRUBIN TOTAL: 0.4 mg/dL (ref 0.3–1.2)
BUN: 11 mg/dL (ref 6–20)
CO2: 25 mmol/L (ref 22–32)
CREATININE: 1.06 mg/dL — AB (ref 0.44–1.00)
Calcium: 9.8 mg/dL (ref 8.9–10.3)
Chloride: 105 mmol/L (ref 101–111)
GFR calc Af Amer: >60 mL/min (ref >60)
GFR, EST NON AFRICAN AMERICAN: 55 mL/min — AB (ref >60)
Glucose, Bld: 128 mg/dL — ABNORMAL HIGH (ref 65–99)
Potassium: 3.7 mmol/L (ref 3.5–5.1)
Sodium: 138 mmol/L (ref 135–145)
TOTAL PROTEIN: 7.1 g/dL (ref 6.5–8.1)

## 2014-12-28 LAB — URINALYSIS, ROUTINE W REFLEX MICROSCOPIC
Bilirubin Urine: NEGATIVE
GLUCOSE, UA: NEGATIVE mg/dL
HGB URINE DIPSTICK: NEGATIVE
Ketones, ur: NEGATIVE mg/dL
Leukocytes, UA: NEGATIVE
Nitrite: NEGATIVE
PH: 5.5 (ref 5.0–8.0)
Protein, ur: NEGATIVE mg/dL
SPECIFIC GRAVITY, URINE: 1.015 (ref 1.005–1.030)
Urobilinogen, UA: 0.2 mg/dL (ref 0.0–1.0)

## 2014-12-28 LAB — CBC
HEMATOCRIT: 37.1 % (ref 36.0–46.0)
Hemoglobin: 12 g/dL (ref 12.0–15.0)
MCH: 29.7 pg (ref 26.0–34.0)
MCHC: 32.3 g/dL (ref 30.0–36.0)
MCV: 91.8 fL (ref 78.0–100.0)
Platelets: 296 10*3/uL (ref 150–400)
RBC: 4.04 MIL/uL (ref 3.87–5.11)
RDW: 13.9 % (ref 11.5–15.5)
WBC: 5.2 10*3/uL (ref 4.0–10.5)

## 2014-12-28 LAB — I-STAT BETA HCG BLOOD, ED (MC, WL, AP ONLY): I-stat hCG, quantitative: <5 m[IU]/mL (ref <5)

## 2014-12-28 LAB — I-STAT CHEM 8, ED
BUN: 14 mg/dL (ref 6–20)
CHLORIDE: 104 mmol/L (ref 101–111)
Calcium, Ion: 1.26 mmol/L (ref 1.13–1.30)
Creatinine, Ser: 1 mg/dL (ref 0.44–1.00)
GLUCOSE: 125 mg/dL — AB (ref 65–99)
HEMATOCRIT: 38 % (ref 36.0–46.0)
Hemoglobin: 12.9 g/dL (ref 12.0–15.0)
POTASSIUM: 3.7 mmol/L (ref 3.5–5.1)
Sodium: 140 mmol/L (ref 135–145)
TCO2: 24 mmol/L (ref 0–100)

## 2014-12-28 LAB — LIPASE, BLOOD: LIPASE: 32 U/L (ref 11–51)

## 2014-12-28 LAB — I-STAT TROPONIN, ED: TROPONIN I, POC: 0.01 ng/mL (ref 0.00–0.08)

## 2014-12-28 MED ORDER — IOHEXOL 300 MG/ML  SOLN
100.0000 mL | Freq: Once | INTRAMUSCULAR | Status: AC | PRN
  Administered 2014-12-28: 100 mL via INTRAVENOUS

## 2014-12-28 MED ORDER — METOCLOPRAMIDE HCL 5 MG/ML IJ SOLN
10.0000 mg | Freq: Once | INTRAMUSCULAR | Status: AC
  Administered 2014-12-28: 10 mg via INTRAVENOUS
  Filled 2014-12-28: qty 2

## 2014-12-28 MED ORDER — SODIUM CHLORIDE 0.9 % IV BOLUS (SEPSIS)
1000.0000 mL | Freq: Once | INTRAVENOUS | Status: AC
  Administered 2014-12-28: 1000 mL via INTRAVENOUS

## 2014-12-28 MED ORDER — GI COCKTAIL ~~LOC~~
30.0000 mL | Freq: Once | ORAL | Status: AC
  Administered 2014-12-28: 30 mL via ORAL
  Filled 2014-12-28: qty 30

## 2014-12-28 MED ORDER — AMLODIPINE BESYLATE 5 MG PO TABS
10.0000 mg | ORAL_TABLET | Freq: Once | ORAL | Status: AC
  Administered 2014-12-28: 10 mg via ORAL
  Filled 2014-12-28: qty 2

## 2014-12-28 MED ORDER — ONDANSETRON 4 MG PO TBDP: ORAL_TABLET | ORAL | Status: DC

## 2014-12-28 MED ORDER — ONDANSETRON HCL 4 MG/2ML IJ SOLN
4.0000 mg | Freq: Once | INTRAMUSCULAR | Status: AC
Start: 2014-12-28 — End: 2014-12-28
  Administered 2014-12-28: 4 mg via INTRAVENOUS
  Filled 2014-12-28: qty 2

## 2014-12-28 NOTE — ED Notes (Signed)
Patient ambulated in hallway used bathroom patient did not obtain urine sample. Ambulated steady gait.

## 2014-12-28 NOTE — ED Notes (Signed)
Onset one day ago LUQ and RUQ abdominal pain radiating to middle chest pain continued today 8/10 achy sharp. States possible might be pregnant. LMP March 2015. EMS gave 324 mg aspirin and 3 nitro SL prior to arrival.

## 2014-12-28 NOTE — ED Notes (Signed)
Pt stable, ambulatory, states understanding of discharge instructions 

## 2014-12-28 NOTE — ED Notes (Signed)
Patient placed back on monitor, continuous pulse oximetry and blood pressure cuff 

## 2014-12-28 NOTE — ED Provider Notes (Signed)
CSN: [id number]     Arrival date & time 12/28/14  0912 History   First MD Initiated Contact with Patient 12/28/14 0914     Chief Complaint  Patient presents with  . Chest Pain     (Consider location/radiation/quality/duration/timing/severity/associated sxs/prior Treatment) HPI Comments: Patient here with 5 days of vomiting, diarrhea, nausea, burning-like chest pain. She states she's had vomiting around twice a day usually after eating. She denies any hematemesis. She is also coughing up a lot of mucus.  She reports chest pain, describes as burning and one saying burning she traces from her epigastrium up to her throat. She denies any shortness of breath. She reports only history of hypertension and she reports she has been taking her medications.   Patient is a 64 y.o. female presenting with chest pain and vomiting. The history is provided by the patient.  Chest Pain Pain location:  Substernal area Pain quality: no pressure, not stabbing and not tearing   Pain radiates to:  Does not radiate Pain severity:  Moderate Onset quality:  Gradual Timing:  Constant Associated symptoms: no abdominal pain, no cough, no fever, no shortness of breath and not vomiting   Emesis Severity:  Mild Timing:  Intermittent Associated symptoms: no abdominal pain     Past Medical History  Diagnosis Date  . Hypertension   . Left ventricular diastolic dysfunction, NYHA class 1 March 20    Severe concentric LVH (likely hypertensive), mild aortic sclerosis. Grade 1 diastolic dysfunction  . Coronary artery disease 04/2009    50% stenosis in the perforator of LAD; catheterization was for an abnormal Myoview in January 2000 showing anterior and inferolateral ischemia.  [name] PAD (peripheral artery disease) [facility]) March 2013    Lower extremity Dopplers: R. SFA 50-60%, R. PTA proximally occluded with distal reconstitution;; L. common iliac ~50%, L. SFA 50-70% stenosis, L. PTA < 50%  . Hyperlipidemia   . Chronic  kidney disease (CKD), stage II (mild)     Class I-II  . History of schizophrenia     However I am not sure about the validity of this. She is not on any medications.  . Chronic back pain   . 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 At least 3 subsequent echocardiograms have shown improved/normal EF with moderate to severe concentric LVH and diastolic dysfunction with LVOT/intracavitary gradient  . Heart murmur     Previously reported moderate to severe MR. Most recent echo in May 2014 revealed mild MR.  [name] Hypertensive hypertrophic cardiomyopathy: NYHA class II:  Echo: Severe concentric LVH with LV OT gradient; essentially preserved EF with diastolic dysfunction 02/15/2013    Not significantly symptomatic; echocardiogram from May 2014: Severe concentric LVH with EF 50-55%. Somewhat unbelievably grade 1 diastolic dysfunction with moderate LE dilation. Trivial MR noted.   Past Surgical History  Procedure Laterality Date  . Bunionectomy    . Nm myocar perf wall motion  03/2009    Persantine; EF 51%-both anterior and inferolateral ischemia  . Lower extremity doppler  05/29/2011    right SFA 50% to 59% diameter reduction,right posterior tibal atreery occlusive disease,reconstituting distally, left common illiac<50%,left SFA 50 to70%,left post. tibial <50%  . Doppler echocardiography  May 2014    EF 50-55%; severe concentric LVH; only grade 1 diastolic dysfunction. Mild aortic sclerosis - with LVOT /intracavitary gradient of roughly 20 mmHg mean. Mild to moderately dilated  LA;; previously reported MR not seen  . Doppler echocardiography  02/02/2009    mod to severe regurg mitral, EF 20 TO 25%, tricuspid severe regurg. ,pulmonic moderate regurg.  . Cardiac catheterization  04/09/2009    rt and lt cath -non obstructive coronary disease   . Carotid doppler  05/29/2011    left  bulb/prox ICA moderate amtfibrous plaque with no evidence significant reduction.,right bulb /proximal ICA normal patency   Family History  Problem Relation Age of Onset  . Hypertension Mother    Social History  Substance Use Topics  . Smoking status: Former Smoker    Quit date: 05/14/2002  . Smokeless tobacco: Never Used  . Alcohol Use: No   OB History    No data available     Review of Systems  Constitutional: Negative for fever.  Respiratory: Negative for cough and shortness of breath.   Cardiovascular: Positive for chest pain.  Gastrointestinal: Negative for vomiting and abdominal pain.  All other systems reviewed and are negative.     Allergies  Review of patient's allergies indicates no known allergies.  Home Medications   Prior to Admission medications   Medication Sig Start Date End Date Taking? Authorizing Provider  amLODipine (NORVASC) 10 MG tablet take 1 tablet by mouth once daily 03/13/14   Marykay Lex, MD  aspirin EC 81 MG tablet Take 81 mg by mouth daily.    Historical Provider, MD  atorvastatin (LIPITOR) 40 MG tablet Take 1 tablet (40 mg total) by mouth daily. 06/08/14   Marykay Lex, MD  benzonatate (TESSALON) 100 MG capsule Take 1 capsule (100 mg total) by mouth 3 (three) times daily as needed for cough. 11/30/14   Joseph Art, DO  carvedilol (COREG) 25 MG tablet Take 1 tablet (25 mg total) by mouth 2 (two) times daily with a meal. 03/13/14   Marykay Lex, MD  DIGITEK 125 MCG tablet take 1 tablet by mouth once daily 03/13/14   Marykay Lex, MD  hydrALAZINE (APRESOLINE) 50 MG tablet take 1 tablet by mouth three times a day 03/13/14   Marykay Lex, MD  isosorbide mononitrate (IMDUR) 60 MG 24 hr tablet take 1 1/2 tablets by mouth once daily 03/13/14   Marykay Lex, MD  levofloxacin (LEVAQUIN) 750 MG tablet Take 1 tablet (750 mg total) by mouth daily. 11/30/14   Joseph Art, DO  methocarbamol (ROBAXIN) 500 MG tablet Take 1 tablet (500 mg total) by  mouth 3 (three) times daily as needed for muscle spasms (back spasm). 09/28/14   Tia Alert, MD  naproxen (NAPROSYN) 500 MG tablet Take 1 tablet (500 mg total) by mouth 2 (two) times daily. 11/25/14   Felicie Morn, NP  omeprazole (PRILOSEC) 20 MG capsule Take 20 mg by mouth daily.  09/22/13   Historical Provider, MD  oxyCODONE-acetaminophen (PERCOCET/ROXICET) 5-325 MG per tablet Take 1-2 tablets by mouth every 6 (six) hours as needed for moderate pain. 09/28/14   Tia Alert, MD  spironolactone (ALDACTONE) 25 MG tablet take 1/2 tablet by mouth every morning 03/13/14   Marykay Lex, MD  sucralfate (CARAFATE) 1 G tablet Take 1 g by mouth 4 (four) times daily. 09/25/13   Rolland Porter, MD  traMADol (ULTRAM) 50 MG tablet Take 1 tablet (50 mg total) by mouth every 6 (six) hours as needed. 12/13/14   Nelva Nay, MD   BP 216/113 mmHg  Pulse 74  Temp(Src) 97.8 F (36.6 C) (Oral)  Resp  16  Ht 5\' 8"  (1.727 m)  Wt 181 lb (82.101 kg)  BMI 27.53 kg/m2  SpO2 96%  LMP 05/10/2013 Physical Exam  Constitutional: She is oriented to person, place, and time. She appears well-developed and well-nourished. No distress.  HENT:  Head: Normocephalic and atraumatic.  Mouth/Throat: Oropharynx is clear and moist.  Eyes: EOM are normal. Pupils are equal, round, and reactive to light.  Neck: Normal range of motion. Neck supple.  Cardiovascular: Normal rate and regular rhythm.  Exam reveals no friction rub.   No murmur heard. Pulmonary/Chest: Effort normal and breath sounds normal. No respiratory distress. She has no wheezes. She has no rales.  Abdominal: Soft. She exhibits no distension. There is no tenderness. There is no rebound.  Musculoskeletal: Normal range of motion. She exhibits no edema.  Neurological: She is alert and oriented to person, place, and time.  Skin: No rash noted. She is not diaphoretic.  Nursing note and vitals reviewed.   ED Course  Procedures (including critical care time) Labs  Review Labs Reviewed  CBC  COMPREHENSIVE METABOLIC PANEL  LIPASE, BLOOD  URINALYSIS, ROUTINE W REFLEX MICROSCOPIC (NOT AT [facility])  I-STAT BETA HCG BLOOD, ED ([facility], [facility], [facility] ONLY)  I-STAT CHEM 8, ED    Imaging Review Dg Chest 2 View  12/28/2014  CLINICAL DATA:  Chest pain for 5 days.  Hypertension. EXAM: CHEST  2 VIEW COMPARISON:  Chest radiograph and chest CT November 28, 2014 FINDINGS: There is no demonstrable edema or consolidation. Heart size and pulmonary vascularity are normal. Aorta is rather prominent but stable. There is atherosclerotic calcification in the aorta. No adenopathy. There is a total shoulder replacement on the right. There is degenerative change in the thoracic spine. No pneumothorax. IMPRESSION: No edema or consolidation. Aortic prominence may reflect chronic hypertension. Electronically Signed   By: Bretta BangWilliam  Woodruff III M.D.   On: 12/28/2014 09:58   Ct Abdomen Pelvis W Contrast  12/28/2014  CLINICAL DATA:  Upper abdominal pain, nausea/vomiting EXAM: CT ABDOMEN AND PELVIS WITH CONTRAST TECHNIQUE: Multidetector CT imaging of the abdomen and pelvis was performed using the standard protocol following bolus administration of intravenous contrast. CONTRAST:  100mL OMNIPAQUE IOHEXOL 300 MG/ML  SOLN COMPARISON:  11/28/2014 FINDINGS: Lower chest:  Lung bases are clear. Hepatobiliary: Liver is within normal limits. No suspicious/enhancing hepatic lesions. Gallbladder is unremarkable. No intrahepatic or extrahepatic ductal dilatation. Pancreas: Within normal limits. Spleen: Within normal limits. Adrenals/Urinary Tract: Adrenal glands within normal limits. Kidneys are within normal limits.  No hydronephrosis. Bladder is within normal limits. Stomach/Bowel: Stomach is within normal limits. No evidence of bowel obstruction. Normal appendix. Colonic diverticulosis, without evidence of diverticulitis. Vascular/Lymphatic: Atherosclerotic calcifications of the abdominal aorta and branch vessels. No  suspicious abdominopelvic lymphadenopathy. Reproductive: Uterus is within normal limits. Left ovary is within normal limits. Again noted is a complex cystic right ovarian lesion, mildly tubular in appearance, measuring at least 3.1 x 5.0 cm (series 14/ image 69). This appearance remains grossly unchanged from 2012 and therefore is favored to be benign in etiology. Other: No abdominopelvic ascites. Musculoskeletal: Degenerative changes of the visualized thoracolumbar spine. Status post PLIF at L4-S1. IMPRESSION: No evidence of bowel obstruction.  Normal appendix. Colonic diverticulosis, without evidence of diverticulitis. No CT findings to account for the patient's upper abdominal pain. Stable 3.1 x 5.1 cm complex cystic right ovarian lesion, grossly unchanged since 2012, favored to be benign. Electronically Signed   By: Charline BillsSriyesh  Krishnan M.D.   On: 12/28/2014 12:06  I have personally reviewed and evaluated these images and lab results as part of my medical decision-making.   EKG Interpretation   Date/Time:  Monday December 28 2014 09:18:54 EDT Ventricular Rate:  72 PR Interval:  175 QRS Duration: 85 QT Interval:  400 QTC Calculation: 438 R Axis:   -7 Text Interpretation:  Sinus rhythm Left ventricular hypertrophy No  significant change since last tracing Confirmed by Gwendolyn Grant  MD, Shelley Cocke  ([id number]) on 12/28/2014 9:37:40 AM      MDM   Final diagnoses:  Non-intractable vomiting with nausea, vomiting of unspecified type    Review of records shows she was here with chest and epigastric pain one month ago. On that admission, she was noted to have possible early bronchopneumonia. She had a noncontrasted abdominal CT that did not show any bowel pathology.  Here labs are normal. Urine is normal. CT scan of the abdomen is normal. She's feeling better after pain medicine. I feel like this could be psychiatric as earlier she was concerned she might be pregnant and she is 64 years old. She is ambulatory,  tolerating by mouth. With normal labs and imaging, feel she stable for discharge. I gave her a dose of her amlodipine as she hadn't been able to take her blood pressure medicine today. Given Zofran prescription   Elwin Mocha, MD 12/28/14 [phone]

## 2014-12-28 NOTE — ED Notes (Signed)
Given patient [place] sandwich meal and drink per Doctor order.

## 2014-12-28 NOTE — Discharge Instructions (Signed)

## 2015-01-01 ENCOUNTER — Telehealth: Payer: Self-pay | Admitting: *Deleted

## 2015-01-01 NOTE — Telephone Encounter (Signed)
IMMUNIZATION RECORDED IN PATIENT CHART.  ZOSTAVAX BOOSTRIX TDAP

## 2015-01-13 ENCOUNTER — Emergency Department (HOSPITAL_COMMUNITY): Payer: Medicare Other

## 2015-01-13 ENCOUNTER — Encounter (HOSPITAL_COMMUNITY): Payer: Self-pay | Admitting: Family Medicine

## 2015-01-13 ENCOUNTER — Emergency Department (HOSPITAL_COMMUNITY)
Admission: EM | Admit: 2015-01-13 | Discharge: 2015-01-13 | Disposition: A | Discharge status: Home / Self Care | Payer: Medicare Other | Attending: Emergency Medicine | Admitting: Emergency Medicine

## 2015-01-13 DIAGNOSIS — G8929 Other chronic pain: Secondary | ICD-10-CM | POA: Diagnosis not present

## 2015-01-13 DIAGNOSIS — Z87891 Personal history of nicotine dependence: Secondary | ICD-10-CM | POA: Insufficient documentation

## 2015-01-13 DIAGNOSIS — F209 Schizophrenia, unspecified: Secondary | ICD-10-CM | POA: Insufficient documentation

## 2015-01-13 DIAGNOSIS — I129 Hypertensive chronic kidney disease with stage 1 through stage 4 chronic kidney disease, or unspecified chronic kidney disease: Secondary | ICD-10-CM | POA: Diagnosis present

## 2015-01-13 DIAGNOSIS — E785 Hyperlipidemia, unspecified: Secondary | ICD-10-CM | POA: Insufficient documentation

## 2015-01-13 DIAGNOSIS — Z79899 Other long term (current) drug therapy: Secondary | ICD-10-CM | POA: Insufficient documentation

## 2015-01-13 DIAGNOSIS — I2510 Atherosclerotic heart disease of native coronary artery without angina pectoris: Secondary | ICD-10-CM | POA: Insufficient documentation

## 2015-01-13 DIAGNOSIS — I10 Essential (primary) hypertension: Secondary | ICD-10-CM

## 2015-01-13 DIAGNOSIS — N182 Chronic kidney disease, stage 2 (mild): Secondary | ICD-10-CM | POA: Diagnosis not present

## 2015-01-13 DIAGNOSIS — R011 Cardiac murmur, unspecified: Secondary | ICD-10-CM | POA: Diagnosis not present

## 2015-01-13 DIAGNOSIS — B349 Viral infection, unspecified: Secondary | ICD-10-CM

## 2015-01-13 DIAGNOSIS — Z7982 Long term (current) use of aspirin: Secondary | ICD-10-CM | POA: Insufficient documentation

## 2015-01-13 LAB — CBC WITH DIFFERENTIAL/PLATELET
BASOS ABS: 0 10*3/uL (ref 0.0–0.1)
Basophils Relative: 0 %
Eosinophils Absolute: 0.2 10*3/uL (ref 0.0–0.7)
Eosinophils Relative: 3 %
HEMATOCRIT: 36.7 % (ref 36.0–46.0)
HEMOGLOBIN: 11.8 g/dL — AB (ref 12.0–15.0)
LYMPHS PCT: 42 %
Lymphs Abs: 2.1 10*3/uL (ref 0.7–4.0)
MCH: 29.5 pg (ref 26.0–34.0)
MCHC: 32.2 g/dL (ref 30.0–36.0)
MCV: 91.8 fL (ref 78.0–100.0)
MONO ABS: 0.3 10*3/uL (ref 0.1–1.0)
Monocytes Relative: 6 %
NEUTROS ABS: 2.5 10*3/uL (ref 1.7–7.7)
NEUTROS PCT: 49 %
Platelets: 310 10*3/uL (ref 150–400)
RBC: 4 MIL/uL (ref 3.87–5.11)
RDW: 13.9 % (ref 11.5–15.5)
WBC: 5.1 10*3/uL (ref 4.0–10.5)

## 2015-01-13 LAB — COMPREHENSIVE METABOLIC PANEL
ALT: 20 U/L (ref 14–54)
ANION GAP: 5 (ref 5–15)
AST: 25 U/L (ref 15–41)
Albumin: 3.4 g/dL — ABNORMAL LOW (ref 3.5–5.0)
Alkaline Phosphatase: 65 U/L (ref 38–126)
BILIRUBIN TOTAL: 0.9 mg/dL (ref 0.3–1.2)
BUN: 22 mg/dL — ABNORMAL HIGH (ref 6–20)
CO2: 25 mmol/L (ref 22–32)
Calcium: 9.4 mg/dL (ref 8.9–10.3)
Chloride: 105 mmol/L (ref 101–111)
Creatinine, Ser: 1.15 mg/dL — ABNORMAL HIGH (ref 0.44–1.00)
GFR calc Af Amer: 57 mL/min — ABNORMAL LOW (ref >60)
GFR calc non Af Amer: 50 mL/min — ABNORMAL LOW (ref >60)
GLUCOSE: 132 mg/dL — AB (ref 65–99)
POTASSIUM: 3.8 mmol/L (ref 3.5–5.1)
Sodium: 135 mmol/L (ref 135–145)
TOTAL PROTEIN: 7.1 g/dL (ref 6.5–8.1)

## 2015-01-13 LAB — URINALYSIS, ROUTINE W REFLEX MICROSCOPIC
Bilirubin Urine: NEGATIVE
Glucose, UA: NEGATIVE mg/dL
Hgb urine dipstick: NEGATIVE
Ketones, ur: NEGATIVE mg/dL
LEUKOCYTES UA: NEGATIVE
NITRITE: NEGATIVE
PH: 5.5 (ref 5.0–8.0)
Protein, ur: NEGATIVE mg/dL
SPECIFIC GRAVITY, URINE: 1.018 (ref 1.005–1.030)
Urobilinogen, UA: 0.2 mg/dL (ref 0.0–1.0)

## 2015-01-13 NOTE — ED Notes (Signed)
Pt states, "I just  Peed, you are going to have to give me a minute." pt aware of plan to reassess ability to urinate by 15:40 & if not able to produce urine sample discuss I&O

## 2015-01-13 NOTE — Discharge Instructions (Signed)
Take acetaminophen as needed for fever, aching. Drink plenty of fluids.  Monitor your blood pressure at home and keep a log of the readings. Bring the log with you when you see your doctor.  Hypertension Hypertension, commonly called high blood pressure, is when the force of blood pumping through your arteries is too strong. Your arteries are the blood vessels that carry blood from your heart throughout your body. A blood pressure reading consists of a higher number over a lower number, such as 110/72. The higher number (systolic) is the pressure inside your arteries when your heart pumps. The lower number (diastolic) is the pressure inside your arteries when your heart relaxes. Ideally you want your blood pressure below 120/80. Hypertension forces your heart to work harder to pump blood. Your arteries may become narrow or stiff. Having untreated or uncontrolled hypertension can cause heart attack, stroke, kidney disease, and other problems. RISK FACTORS Some risk factors for high blood pressure are controllable. Others are not.  Risk factors you cannot control include:   Race. You may be at higher risk if you are African American.  Age. Risk increases with age.  Gender. Men are at higher risk than women before age 53 years. After age 75, women are at higher risk than men. Risk factors you can control include:  Not getting enough exercise or physical activity.  Being overweight.  Getting too much fat, sugar, calories, or salt in your diet.  Drinking too much alcohol. SIGNS AND SYMPTOMS Hypertension does not usually cause signs or symptoms. Extremely high blood pressure (hypertensive crisis) may cause headache, anxiety, shortness of breath, and nosebleed. DIAGNOSIS To check if you have hypertension, your health care provider will measure your blood pressure while you are seated, with your arm held at the level of your heart. It should be measured at least twice using the same arm. Certain  conditions can cause a difference in blood pressure between your right and left arms. A blood pressure reading that is higher than normal on one occasion does not mean that you need treatment. If it is not clear whether you have high blood pressure, you may be asked to return on a different day to have your blood pressure checked again. Or, you may be asked to monitor your blood pressure at home for 1 or more weeks. TREATMENT Treating high blood pressure includes making lifestyle changes and possibly taking medicine. Living a healthy lifestyle can help lower high blood pressure. You may need to change some of your habits. Lifestyle changes may include:  Following the DASH diet. This diet is high in fruits, vegetables, and whole grains. It is low in salt, red meat, and added sugars.  Keep your sodium intake below 2,300 mg per day.  Getting at least 30-45 minutes of aerobic exercise at least 4 times per week.  Losing weight if necessary.  Not smoking.  Limiting alcoholic beverages.  Learning ways to reduce stress. Your health care provider may prescribe medicine if lifestyle changes are not enough to get your blood pressure under control, and if one of the following is true:  You are 4-45 years of age and your systolic blood pressure is above 140.  You are 81 years of age or older, and your systolic blood pressure is above 150.  Your diastolic blood pressure is above 90.  You have diabetes, and your systolic blood pressure is over 140 or your diastolic blood pressure is over 90.  You have kidney disease and your blood  pressure is above 140/90.  You have heart disease and your blood pressure is above 140/90. Your personal target blood pressure may vary depending on your medical conditions, your age, and other factors. HOME CARE INSTRUCTIONS  Have your blood pressure rechecked as directed by your health care provider.   Take medicines only as directed by your health care provider.  Follow the directions carefully. Blood pressure medicines must be taken as prescribed. The medicine does not work as well when you skip doses. Skipping doses also puts you at risk for problems.  Do not smoke.   Monitor your blood pressure at home as directed by your health care provider. SEEK MEDICAL CARE IF:   You think you are having a reaction to medicines taken.  You have recurrent headaches or feel dizzy.  You have swelling in your ankles.  You have trouble with your vision. SEEK IMMEDIATE MEDICAL CARE IF:  You develop a severe headache or confusion.  You have unusual weakness, numbness, or feel faint.  You have severe chest or abdominal pain.  You vomit repeatedly.  You have trouble breathing. MAKE SURE YOU:   Understand these instructions.  Will watch your condition.  Will get help right away if you are not doing well or get worse.   This information is not intended to replace advice given to you by your health care provider. Make sure you discuss any questions you have with your health care provider.   Document Released: 02/20/2005 Document Revised: 07/07/2014 Document Reviewed: 12/13/2012 Elsevier Interactive Patient Education [employer]2016 Elsevier Inc.

## 2015-01-13 NOTE — ED Notes (Signed)
Pt here for hypertension and body chills. sts that she may be anemic.

## 2015-01-13 NOTE — ED Provider Notes (Signed)
CSN: [id number]     Arrival date & time 01/13/15  1200 History   First MD Initiated Contact with Patient 01/13/15 1455     Chief Complaint  Patient presents with  . Hypertension  . Chills     (Consider location/radiation/quality/duration/timing/severity/associated sxs/prior Treatment) Patient is a 64 y.o. female presenting with hypertension. The history is provided by the patient.  Hypertension  She has had subjective fever along with chills and sweats for the last 3 days. There's been some mild nasal congestion but no sore throat or cough. She denies abdominal pain, nausea, vomiting, diarrhea. She has had urinary urgency and frequency. She denies any flank pain but has had some mild myalgias. She denies any sick contacts. She has not taken anything for any of these symptoms.  Past Medical History  Diagnosis Date  . Hypertension   . Left ventricular diastolic dysfunction, NYHA class 1 March 20    Severe concentric LVH (likely hypertensive), mild aortic sclerosis. Grade 1 diastolic dysfunction  . Coronary artery disease 04/2009    50% stenosis in the perforator of LAD; catheterization was for an abnormal Myoview in January 2000 showing anterior and inferolateral ischemia.  [name] PAD (peripheral artery disease) [facility]) March 2013    Lower extremity Dopplers: R. SFA 50-60%, R. PTA proximally occluded with distal reconstitution;; L. common iliac ~50%, L. SFA 50-70% stenosis, L. PTA < 50%  . Hyperlipidemia   . Chronic kidney disease (CKD), stage II (mild)     Class I-II  . History of schizophrenia     However I am not sure about the validity of this. She is not on any medications.  . Chronic back pain   . 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 At least 3 subsequent echocardiograms have shown improved/normal EF with moderate to severe concentric LVH and diastolic  dysfunction with LVOT/intracavitary gradient  . Heart murmur     Previously reported moderate to severe MR. Most recent echo in May 2014 revealed mild MR.  [name] Hypertensive hypertrophic cardiomyopathy: NYHA class II:  Echo: Severe concentric LVH with LV OT gradient; essentially preserved EF with diastolic dysfunction 02/15/2013    Not significantly symptomatic; echocardiogram from May 2014: Severe concentric LVH with EF 50-55%. Somewhat unbelievably grade 1 diastolic dysfunction with moderate LE dilation. Trivial MR noted.   Past Surgical History  Procedure Laterality Date  . Bunionectomy    . Nm myocar perf wall motion  03/2009    Persantine; EF 51%-both anterior and inferolateral ischemia  . Lower extremity doppler  05/29/2011    right SFA 50% to 59% diameter reduction,right posterior tibal atreery occlusive disease,reconstituting distally, left common illiac<50%,left SFA 50 to70%,left post. tibial <50%  . Doppler echocardiography  May 2014    EF 50-55%; severe concentric LVH; only grade 1 diastolic dysfunction. Mild aortic sclerosis - with LVOT /intracavitary gradient of roughly 20 mmHg mean. Mild to moderately dilated LA;; previously reported MR not seen  . Doppler echocardiography  02/02/2009    mod to severe regurg mitral, EF 20 TO 25%, tricuspid severe regurg. ,pulmonic moderate regurg.  . Cardiac catheterization  04/09/2009    rt and lt cath -non obstructive coronary disease   . Carotid doppler  05/29/2011    left bulb/prox ICA moderate amtfibrous plaque with no evidence significant reduction.,right bulb /proximal ICA normal patency   Family History  Problem Relation Age of Onset  .  Hypertension Mother    Social History  Substance Use Topics  . Smoking status: Former Smoker    Quit date: 05/14/2002  . Smokeless tobacco: Never Used  . Alcohol Use: No   OB History    No data available     Review of Systems  All other systems reviewed and are negative.     Allergies   Review of patient's allergies indicates no known allergies.  Home Medications   Prior to Admission medications   Medication Sig Start Date End Date Taking? Authorizing Provider  amLODipine (NORVASC) 10 MG tablet Take 10 mg by mouth daily.    Historical Provider, MD  aspirin EC 81 MG tablet Take 81 mg by mouth daily.    Historical Provider, MD  atorvastatin (LIPITOR) 40 MG tablet Take 1 tablet (40 mg total) by mouth daily. 06/08/14   Marykay Lexavid W Harding, MD  benzonatate (TESSALON) 100 MG capsule Take 1 capsule (100 mg total) by mouth 3 (three) times daily as needed for cough. 11/30/14   Joseph ArtJessica U Vann, DO  carvedilol (COREG) 25 MG tablet Take 1 tablet (25 mg total) by mouth 2 (two) times daily with a meal. 03/13/14   Marykay Lexavid W Harding, MD  digoxin (LANOXIN) 0.125 MG tablet Take 0.125 mg by mouth daily.    Historical Provider, MD  hydrALAZINE (APRESOLINE) 50 MG tablet take 1 tablet by mouth three times a day Patient taking differently: Take 50 mg by mouth 3 (three) times daily.  03/13/14   Marykay Lexavid W Harding, MD  isosorbide mononitrate (IMDUR) 60 MG 24 hr tablet Take 90 mg by mouth daily.    Historical Provider, MD  levofloxacin (LEVAQUIN) 750 MG tablet Take 1 tablet (750 mg total) by mouth daily. 11/30/14   Joseph ArtJessica U Vann, DO  methocarbamol (ROBAXIN) 500 MG tablet Take 1 tablet (500 mg total) by mouth 3 (three) times daily as needed for muscle spasms (back spasm). 09/28/14   Tia Alertavid S Jones, MD  naproxen (NAPROSYN) 500 MG tablet Take 1 tablet (500 mg total) by mouth 2 (two) times daily. 11/25/14   Felicie Mornavid Smith, NP  omeprazole (PRILOSEC) 20 MG capsule Take 20 mg by mouth daily as needed (reflux).  09/22/13   Historical Provider, MD  ondansetron (ZOFRAN ODT) 4 MG disintegrating tablet 1 tablet sublingual q6h PRN nausea/vomiting 12/28/14   Elwin MochaBlair Walden, MD  oxyCODONE-acetaminophen (PERCOCET/ROXICET) 5-325 MG per tablet Take 1-2 tablets by mouth every 6 (six) hours as needed for moderate pain. 09/28/14   Tia Alertavid S Jones, MD   spironolactone (ALDACTONE) 25 MG tablet Take 12.5 mg by mouth daily.    Historical Provider, MD  sucralfate (CARAFATE) 1 G tablet Take 1 g by mouth 4 (four) times daily. 09/25/13   Rolland PorterMark James, MD  traMADol (ULTRAM) 50 MG tablet Take 1 tablet (50 mg total) by mouth every 6 (six) hours as needed. 12/13/14   Nelva Nayobert Beaton, MD   BP 203/101 mmHg  Pulse 80  Temp(Src) 98.5 F (36.9 C) (Oral)  Resp 20  Ht 5\' 7"  (1.702 m)  Wt 211 lb (95.709 kg)  BMI 33.04 kg/m2  SpO2 99%  LMP 05/10/2013 Physical Exam  Nursing note and vitals reviewed.  64 year old female, resting comfortably and in no acute distress. Vital signs are significant for hypertension. Oxygen saturation is 99%, which is normal. Head is normocephalic and atraumatic. PERRLA, EOMI. Oropharynx is clear. Neck is nontender and supple without adenopathy or JVD. Back is nontender and there is no CVA tenderness. Lungs are  clear without rales, wheezes, or rhonchi. Chest is nontender. Heart has regular rate and rhythm with 2/6 systolic ejection murmur with radiation to the neck. Abdomen is soft, flat, nontender without masses or hepatosplenomegaly and peristalsis is normoactive. Extremities have trace edema, full range of motion is present. Skin is warm and dry without rash. Neurologic: Mental status is normal, cranial nerves are intact, there are no motor or sensory deficits.  ED Course  Procedures (including critical care time) Labs Review Results for orders placed or performed during the hospital encounter of 01/13/15  Comprehensive metabolic panel  Result Value Ref Range   Sodium 135 135 - 145 mmol/L   Potassium 3.8 3.5 - 5.1 mmol/L   Chloride 105 101 - 111 mmol/L   CO2 25 22 - 32 mmol/L   Glucose, Bld 132 (H) 65 - 99 mg/dL   BUN 22 (H) 6 - 20 mg/dL   Creatinine, Ser 1.61 (H) 0.44 - 1.00 mg/dL   Calcium 9.4 8.9 - 09.6 mg/dL   Total Protein 7.1 6.5 - 8.1 g/dL   Albumin 3.4 (L) 3.5 - 5.0 g/dL   AST 25 15 - 41 U/L   ALT 20 14 -  54 U/L   Alkaline Phosphatase 65 38 - 126 U/L   Total Bilirubin 0.9 0.3 - 1.2 mg/dL   GFR calc non Af Amer 50 (L) >60 mL/min   GFR calc Af Amer 57 (L) >60 mL/min   Anion gap 5 5 - 15  CBC with Differential  Result Value Ref Range   WBC 5.1 4.0 - 10.5 K/uL   RBC 4.00 3.87 - 5.11 MIL/uL   Hemoglobin 11.8 (L) 12.0 - 15.0 g/dL   HCT 04.5 40.9 - 81.1 %   MCV 91.8 78.0 - 100.0 fL   MCH 29.5 26.0 - 34.0 pg   MCHC 32.2 30.0 - 36.0 g/dL   RDW 91.4 78.2 - 95.6 %   Platelets 310 150 - 400 K/uL   Neutrophils Relative % 49 %   Neutro Abs 2.5 1.7 - 7.7 K/uL   Lymphocytes Relative 42 %   Lymphs Abs 2.1 0.7 - 4.0 K/uL   Monocytes Relative 6 %   Monocytes Absolute 0.3 0.1 - 1.0 K/uL   Eosinophils Relative 3 %   Eosinophils Absolute 0.2 0.0 - 0.7 K/uL   Basophils Relative 0 %   Basophils Absolute 0.0 0.0 - 0.1 K/uL  Urinalysis, Routine w reflex microscopic  Result Value Ref Range   Color, Urine YELLOW YELLOW   APPearance CLEAR CLEAR   Specific Gravity, Urine 1.018 1.005 - 1.030   pH 5.5 5.0 - 8.0   Glucose, UA NEGATIVE NEGATIVE mg/dL   Hgb urine dipstick NEGATIVE NEGATIVE   Bilirubin Urine NEGATIVE NEGATIVE   Ketones, ur NEGATIVE NEGATIVE mg/dL   Protein, ur NEGATIVE NEGATIVE mg/dL   Urobilinogen, UA 0.2 0.0 - 1.0 mg/dL   Nitrite NEGATIVE NEGATIVE   Leukocytes, UA NEGATIVE NEGATIVE   Imaging Review Dg Chest 2 View  01/13/2015  CLINICAL DATA:  Fevers and bilateral lower extremity swelling EXAM: CHEST - 2 VIEW COMPARISON:  12/28/2014 FINDINGS: Cardiac shadow is at the upper limits of normal in size. The lungs are clear bilaterally. No sizable effusion is noted. Degenerative change of thoracic spine is seen. Postsurgical changes in the right shoulder are noted. IMPRESSION: No acute abnormality noted. Electronically Signed   By: Alcide Clever M.D.   On: 01/13/2015 15:33   I have personally reviewed and evaluated these images and lab  results as part of my medical decision-making.   MDM    Final diagnoses:  Viral illness  Essential hypertension    Chills and subjective fever and myalgias. Possible viral illness. With urinary symptoms, need to rule out urinary tract infection. Will also get chest x-ray to rule out occult pneumonia. Hypertension is noted and review of old records shows that she has had elevated blood pressures in similar range that usually come down with observation in the ED and without any intervention.  ED workup is unremarkable. No evidence of pneumonia or urinary tract infection and WBC is normal. Blood pressure has come down although still somewhat elevated. Patient is advised to monitor her blood pressure at home. She is discharged with instructions to use acetaminophen as needed for fever and aching and follow-up with PCP.  Dione Booze, MD 01/13/15 1736

## 2015-01-18 ENCOUNTER — Encounter (HOSPITAL_COMMUNITY): Payer: Self-pay | Admitting: *Deleted

## 2015-01-18 ENCOUNTER — Inpatient Hospital Stay (HOSPITAL_COMMUNITY)
Admission: AD | Admit: 2015-01-18 | Discharge: 2015-01-18 | Disposition: A | Discharge status: Home / Self Care | Payer: Medicare Other | Source: Ambulatory Visit | Attending: Obstetrics & Gynecology | Admitting: Obstetrics & Gynecology

## 2015-01-18 DIAGNOSIS — Z87891 Personal history of nicotine dependence: Secondary | ICD-10-CM | POA: Diagnosis not present

## 2015-01-18 DIAGNOSIS — Z7982 Long term (current) use of aspirin: Secondary | ICD-10-CM | POA: Insufficient documentation

## 2015-01-18 DIAGNOSIS — I129 Hypertensive chronic kidney disease with stage 1 through stage 4 chronic kidney disease, or unspecified chronic kidney disease: Secondary | ICD-10-CM | POA: Diagnosis not present

## 2015-01-18 DIAGNOSIS — M7989 Other specified soft tissue disorders: Secondary | ICD-10-CM | POA: Diagnosis present

## 2015-01-18 DIAGNOSIS — N898 Other specified noninflammatory disorders of vagina: Secondary | ICD-10-CM | POA: Diagnosis not present

## 2015-01-18 LAB — URINALYSIS, ROUTINE W REFLEX MICROSCOPIC
BILIRUBIN URINE: NEGATIVE
GLUCOSE, UA: NEGATIVE mg/dL
HGB URINE DIPSTICK: NEGATIVE
Ketones, ur: NEGATIVE mg/dL
Leukocytes, UA: NEGATIVE
NITRITE: NEGATIVE
PH: 5.5 (ref 5.0–8.0)
Protein, ur: NEGATIVE mg/dL
SPECIFIC GRAVITY, URINE: 1.02 (ref 1.005–1.030)
Urobilinogen, UA: 0.2 mg/dL (ref 0.0–1.0)

## 2015-01-18 LAB — WET PREP, GENITAL
Clue Cells Wet Prep HPF POC: NONE SEEN
Trich, Wet Prep: NONE SEEN
Yeast Wet Prep HPF POC: NONE SEEN

## 2015-01-18 NOTE — Discharge Instructions (Signed)
Angina Pectoris  Angina pectoris, often called angina, is extreme discomfort in the chest, neck, or arm. This is caused by a lack of blood in the middle and thickest layer of the heart wall (myocardium). There are four types of angina:  · Stable angina. Stable angina usually occurs in episodes of predictable frequency and duration. It is usually brought on by physical activity, stress, or excitement. Stable angina usually lasts a few minutes and can often be relieved by a medicine that you place under your tongue. This medicine is called sublingual nitroglycerin.  · Unstable angina. Unstable angina can occur even when you are doing little or no physical activity. It can even occur while you are sleeping or when you are at rest. It can suddenly increase in severity or frequency. It may not be relieved by sublingual nitroglycerin, and it can last up to 30 minutes.  · Microvascular angina. This type of angina is caused by a disorder of tiny blood vessels called arterioles. Microvascular angina is more common in women. The pain may be more severe and last longer than other types of angina pectoris.  · Prinzmetal or variant angina. This type of angina pectoris is rare and usually occurs when you are doing little or no physical activity. It especially occurs in the early morning hours.  CAUSES  Atherosclerosis is the cause of angina. This is the buildup of fat and cholesterol (plaque) on the inside of the arteries. Over time, the plaque may narrow or block the artery, and this will lessen blood flow to the heart. Plaque can also become weak and break off within a coronary artery to form a clot and cause a sudden blockage.  RISK FACTORS  Risk factors common to both men and women include:  · High cholesterol levels.  · High blood pressure (hypertension).  · Tobacco use.  · Diabetes.  · Family history of angina.  · Obesity.  · Lack of exercise.  · A diet high in saturated fats.  Women are at greater risk for angina if they  are:  · Over age 55.  · Postmenopausal.  SYMPTOMS  Many people do not experience any symptoms during the early stages of angina. As the condition progresses, symptoms common to both men and women may include:  · Chest pain.    The pain can be described as a crushing or squeezing in the chest, or a tightness, pressure, fullness, or heaviness in the chest.    The pain can last more than a few minutes, or it can stop and recur.  · Pain in the arms, neck, jaw, or back.  · Unexplained heartburn or indigestion.  · Shortness of breath.  · Nausea.  · Sudden cold sweats.  · Sudden light-headedness.  Many women have chest discomfort and some of the other symptoms. However, women often have different (atypical) symptoms, such as:   · Fatigue.  · Unexplained feelings of nervousness or anxiety.  · Unexplained weakness.  · Dizziness or fainting.  Sometimes, women may have angina without any symptoms.  DIAGNOSIS   Tests to diagnose angina may include:  · ECG (electrocardiogram).  · Exercise stress test. This looks for signs of blockage when the heart is being exercised.  · Pharmacologic stress test. This test looks for signs of blockage when the heart is being stressed with a medicine.  · Blood tests.  · Coronary angiogram. This is a procedure to look at the coronary arteries to see if there is any blockage.    TREATMENT   The treatment of angina may include the following:  · Healthy behavioral changes to reduce or control risk factors.  · Medicine.  · Coronary stenting. A stent helps to keep an artery open.  · Coronary angioplasty. This procedure widens a narrowed or blocked artery.  · Coronary artery bypass surgery. This will allow your blood to pass the blockage (bypass) to reach your heart.  HOME CARE INSTRUCTIONS   · Take medicines only as directed by your health care provider.  · Do not take the following medicines unless your health care provider approves:    Nonsteroidal anti-inflammatory drugs (NSAIDs), such as ibuprofen,  naproxen, or celecoxib.    Vitamin supplements that contain vitamin A, vitamin E, or both.    Hormone replacement therapy that contains estrogen with or without progestin.  · Manage other health conditions such as hypertension and diabetes as directed by your health care provider.  · Follow a heart-healthy diet. A dietitian can help to educate you about healthy food options and changes.  · Use healthy cooking methods such as roasting, grilling, broiling, baking, poaching, steaming, or stir-frying. Talk to a dietitian to learn more about healthy cooking methods.  · Follow an exercise program approved by your health care provider.  · Maintain a healthy weight. Lose weight as approved by your health care provider.  · Plan rest periods when fatigued.  · Learn to manage stress.  · Do not use any tobacco products, including cigarettes, chewing tobacco, or electronic cigarettes. If you need help quitting, ask your health care provider.  · If you drink alcohol, and your health care provider approves, limit your alcohol intake to no more than 1 drink per day. One drink equals 12 ounces of beer, 5 ounces of wine, or 1½ ounces of hard liquor.  · Stop illegal drug use.  · Keep all follow-up visits as directed by your health care provider. This is important.  SEEK IMMEDIATE MEDICAL CARE IF:   · You have pain in your chest, neck, arm, jaw, stomach, or back that lasts more than a few minutes, is recurring, or is unrelieved by taking sublingual nitroglycerin.  · You have profuse sweating without cause.  · You have unexplained:    Heartburn or indigestion.    Shortness of breath or difficulty breathing.    Nausea or vomiting.    Fatigue.    Feelings of nervousness or anxiety.    Weakness.    Diarrhea.  · You have sudden light-headedness or dizziness.  · You faint.  These symptoms may represent a serious problem that is an emergency. Do not wait to see if the symptoms will go away. Get medical help right away. Call your local  emergency services (911 in the [place] Do not drive yourself to the hospital.     This information is not intended to replace advice given to you by your health care provider. Make sure you discuss any questions you have with your health care provider.     Document Released: 02/20/2005 Document Revised: 03/13/2014 Document Reviewed: 06/24/2013  Elsevier Interactive Patient Education ©2016 [employer]

## 2015-01-18 NOTE — MAU Note (Addendum)
States this morning she noticed swelling in her feet, legs and hands. Urgency and frequency of urination. C/O tightness and pulling in lower abdomen and suprapubic area. States she has had loose stools and constipation. States she has a thick whitish-yellow discharge with an odor.

## 2015-01-18 NOTE — MAU Provider Note (Signed)
History     CSN: [id number]  Arrival date and time: 01/18/15 4010   First Provider Initiated Contact with Patient 01/18/15 [phone]      Chief Complaint  Patient presents with  . Swelling of legs, feet and hands    HPI    Erin Riley is a 64 y.o. female G2P3003 non-pregnant female presenting with vaginal discharge, symptoms of vaginal itching and urinary fequency since November 9 when she was seen in the ER. She denies chest pain, visual disturbances, headache.  She see's Dr. Adela Lank for primary care and management of her hypertension  OB History    Gravida Para Term Preterm AB TAB SAB Ectopic Multiple Living   Past Medical History  Diagnosis Date  . Hypertension   . Left ventricular diastolic dysfunction, NYHA class 1 March 20    Severe concentric LVH (likely hypertensive), mild aortic sclerosis. Grade 1 diastolic dysfunction  . Coronary artery disease 04/2009    50% stenosis in the perforator of LAD; catheterization was for an abnormal Myoview in January 2000 showing anterior and inferolateral ischemia.  [name] PAD (peripheral artery disease) [facility]) March 2013    Lower extremity Dopplers: R. SFA 50-60%, R. PTA proximally occluded with distal reconstitution;; L. common iliac ~50%, L. SFA 50-70% stenosis, L. PTA < 50%  . Hyperlipidemia   . Chronic kidney disease (CKD), stage II (mild)     Class I-II  . History of schizophrenia     However I am not sure about the validity of this. She is not on any medications.  . Chronic back pain   . 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 At least 3 subsequent echocardiograms have shown improved/normal EF with moderate to severe concentric LVH and diastolic dysfunction with LVOT/intracavitary gradient  . Heart murmur     Previously reported moderate to severe MR. Most recent echo in May 2014  revealed mild MR.  [name] Hypertensive hypertrophic cardiomyopathy: NYHA class II:  Echo: Severe concentric LVH with LV OT gradient; essentially preserved EF with diastolic dysfunction 02/15/2013    Not significantly symptomatic; echocardiogram from May 2014: Severe concentric LVH with EF 50-55%. Somewhat unbelievably grade 1 diastolic dysfunction with moderate LE dilation. Trivial MR noted.    Past Surgical History  Procedure Laterality Date  . Bunionectomy    . Nm myocar perf wall motion  03/2009    Persantine; EF 51%-both anterior and inferolateral ischemia  . Lower extremity doppler  05/29/2011    right SFA 50% to 59% diameter reduction,right posterior tibal atreery occlusive disease,reconstituting distally, left common illiac<50%,left SFA 50 to70%,left post. tibial <50%  . Doppler echocardiography  May 2014    EF 50-55%; severe concentric LVH; only grade 1 diastolic dysfunction. Mild aortic sclerosis - with LVOT /intracavitary gradient of roughly 20 mmHg mean. Mild to moderately dilated LA;; previously reported MR not seen  . Doppler echocardiography  02/02/2009    mod to severe regurg mitral, EF 20 TO 25%, tricuspid severe regurg. ,pulmonic moderate regurg.  . Cardiac catheterization  04/09/2009    rt and lt cath -non obstructive coronary disease   . Carotid doppler  05/29/2011    left bulb/prox ICA moderate amtfibrous plaque with no evidence significant reduction.,right bulb /proximal ICA normal patency    Family History  Problem Relation Age of  Onset  . Hypertension Mother     Social History  Substance Use Topics  . Smoking status: Former Smoker    Quit date: 05/14/2002  . Smokeless tobacco: Never Used  . Alcohol Use: No    Allergies: No Known Allergies  Prescriptions prior to admission  Medication Sig Dispense Refill Last Dose  . amLODipine (NORVASC) 10 MG tablet Take 10 mg by mouth daily.   01/18/2015 at Unknown time  . aspirin EC 81 MG tablet Take 81 mg by mouth daily.    01/18/2015 at Unknown time  . atorvastatin (LIPITOR) 40 MG tablet Take 1 tablet (40 mg total) by mouth daily. 90 tablet 1 01/18/2015 at Unknown time  . benzonatate (TESSALON) 100 MG capsule Take 1 capsule (100 mg total) by mouth 3 (three) times daily as needed for cough. 20 capsule 0 01/18/2015 at Unknown time  . carvedilol (COREG) 25 MG tablet Take 1 tablet (25 mg total) by mouth 2 (two) times daily with a meal. 60 tablet 11 01/18/2015 at Unknown time  . digoxin (LANOXIN) 0.125 MG tablet Take 0.125 mg by mouth daily.   01/18/2015 at Unknown time  . hydrALAZINE (APRESOLINE) 50 MG tablet take 1 tablet by mouth three times a day (Patient taking differently: Take 50 mg by mouth 3 (three) times daily. ) 90 tablet 11 01/18/2015 at Unknown time  . isosorbide mononitrate (IMDUR) 60 MG 24 hr tablet Take 90 mg by mouth daily.   01/18/2015 at Unknown time  . methocarbamol (ROBAXIN) 500 MG tablet Take 1 tablet (500 mg total) by mouth 3 (three) times daily as needed for muscle spasms (back spasm). 60 tablet 1 01/18/2015 at Unknown time  . naproxen (NAPROSYN) 500 MG tablet Take 1 tablet (500 mg total) by mouth 2 (two) times daily. 20 tablet 0 01/18/2015 at Unknown time  . omeprazole (PRILOSEC) 20 MG capsule Take 20 mg by mouth daily.    01/18/2015 at Unknown time  . ondansetron (ZOFRAN ODT) 4 MG disintegrating tablet 1 tablet sublingual q6h PRN nausea/vomiting 10 tablet 0 01/18/2015 at Unknown time  . oxyCODONE-acetaminophen (PERCOCET/ROXICET) 5-325 MG per tablet Take 1-2 tablets by mouth every 6 (six) hours as needed for moderate pain. 120 tablet 0 01/17/2015 at Unknown time  . spironolactone (ALDACTONE) 25 MG tablet Take 12.5 mg by mouth daily.   01/18/2015 at Unknown time  . sucralfate (CARAFATE) 1 G tablet Take 1 g by mouth 4 (four) times daily.   01/18/2015 at Unknown time  . traMADol (ULTRAM) 50 MG tablet Take 1 tablet (50 mg total) by mouth every 6 (six) hours as needed. 20 tablet 0 01/18/2015 at Unknown  time  . levofloxacin (LEVAQUIN) 750 MG tablet Take 1 tablet (750 mg total) by mouth daily. (Patient not taking: Reported on 01/18/2015) 2 tablet 0 Completed Course at Unknown time   Results for orders placed or performed during the hospital encounter of 01/18/15 (from the past 24 hour(s))  Urinalysis, Routine w reflex microscopic (not at [facility])     Status: None   Collection Time: 01/18/15  8:49 AM  Result Value Ref Range   Color, Urine YELLOW YELLOW   APPearance CLEAR CLEAR   Specific Gravity, Urine 1.020 1.005 - 1.030   pH 5.5 5.0 - 8.0   Glucose, UA NEGATIVE NEGATIVE mg/dL   Hgb urine dipstick NEGATIVE NEGATIVE   Bilirubin Urine NEGATIVE NEGATIVE   Ketones, ur NEGATIVE NEGATIVE mg/dL   Protein, ur NEGATIVE NEGATIVE mg/dL   Urobilinogen, UA 0.2 0.0 -  1.0 mg/dL   Nitrite NEGATIVE NEGATIVE   Leukocytes, UA NEGATIVE NEGATIVE    Review of Systems  Constitutional: Negative for fever.  Eyes: Negative for blurred vision.  Cardiovascular: Negative for chest pain.  Gastrointestinal: Positive for abdominal pain, diarrhea and constipation.  Genitourinary: Positive for urgency and frequency.       Vaginal discharge  Neurological: Negative for headaches.   Physical Exam   Blood pressure 188/100, pulse 85, temperature 97.9 F (36.6 C), temperature source Oral, resp. rate 18, height  (1.727 m), weight 237 lb (107.502 kg), last menstrual period 05/10/2013.  Physical Exam  Nursing note and vitals reviewed. Constitutional: She is oriented to person, place, and time. She appears well-developed and well-nourished. No distress.  HENT:  Head: Normocephalic and atraumatic.  Neck: Normal range of motion. Neck supple.  Cardiovascular: Normal rate.   Respiratory: Effort normal. No respiratory distress.  GI: Soft.  Slight suprapubic tenderness  Genitourinary: Vaginal discharge found.  Clear discharge on spec exam  Neurological: She is alert and oriented to person, place, and time.  Skin:  Skin is warm and dry.   Results for orders placed or performed during the hospital encounter of 01/18/15 (from the past 24 hour(s))  Urinalysis, Routine w reflex microscopic (not at [facility])     Status: None   Collection Time: 01/18/15  8:49 AM  Result Value Ref Range   Color, Urine YELLOW YELLOW   APPearance CLEAR CLEAR   Specific Gravity, Urine 1.020 1.005 - 1.030   pH 5.5 5.0 - 8.0   Glucose, UA NEGATIVE NEGATIVE mg/dL   Hgb urine dipstick NEGATIVE NEGATIVE   Bilirubin Urine NEGATIVE NEGATIVE   Ketones, ur NEGATIVE NEGATIVE mg/dL   Protein, ur NEGATIVE NEGATIVE mg/dL   Urobilinogen, UA 0.2 0.0 - 1.0 mg/dL   Nitrite NEGATIVE NEGATIVE   Leukocytes, UA NEGATIVE NEGATIVE  Wet prep, genital     Status: Abnormal   Collection Time: 01/18/15 10:20 AM  Result Value Ref Range   Yeast Wet Prep HPF POC NONE SEEN NONE SEEN   Trich, Wet Prep NONE SEEN NONE SEEN   Clue Cells Wet Prep HPF POC NONE SEEN NONE SEEN   WBC, Wet Prep HPF POC FEW (A) NONE SEEN    MAU Course  Procedures  MDM WEt Prep and UA reveal normal results. Discussed her going to her Dr. For her management of hypertension. Discussed chest pain and stroke symptoms with pt and should she expereince any of these she should return to [facility] ED.  Assessment and Plan  Vaginal discharge Discharge to home

## 2015-02-02 ENCOUNTER — Encounter (HOSPITAL_COMMUNITY): Payer: Self-pay | Admitting: Emergency Medicine

## 2015-02-02 ENCOUNTER — Emergency Department (HOSPITAL_COMMUNITY)
Admission: EM | Admit: 2015-02-02 | Discharge: 2015-02-02 | Disposition: A | Discharge status: Home / Self Care | Payer: Medicare Other | Attending: Emergency Medicine | Admitting: Emergency Medicine

## 2015-02-02 DIAGNOSIS — E785 Hyperlipidemia, unspecified: Secondary | ICD-10-CM | POA: Insufficient documentation

## 2015-02-02 DIAGNOSIS — Z79899 Other long term (current) drug therapy: Secondary | ICD-10-CM | POA: Insufficient documentation

## 2015-02-02 DIAGNOSIS — Z87891 Personal history of nicotine dependence: Secondary | ICD-10-CM | POA: Diagnosis not present

## 2015-02-02 DIAGNOSIS — I129 Hypertensive chronic kidney disease with stage 1 through stage 4 chronic kidney disease, or unspecified chronic kidney disease: Secondary | ICD-10-CM | POA: Diagnosis not present

## 2015-02-02 DIAGNOSIS — G8929 Other chronic pain: Secondary | ICD-10-CM | POA: Diagnosis not present

## 2015-02-02 DIAGNOSIS — I2510 Atherosclerotic heart disease of native coronary artery without angina pectoris: Secondary | ICD-10-CM | POA: Diagnosis not present

## 2015-02-02 DIAGNOSIS — Z7982 Long term (current) use of aspirin: Secondary | ICD-10-CM | POA: Insufficient documentation

## 2015-02-02 DIAGNOSIS — M25511 Pain in right shoulder: Secondary | ICD-10-CM | POA: Insufficient documentation

## 2015-02-02 DIAGNOSIS — R011 Cardiac murmur, unspecified: Secondary | ICD-10-CM | POA: Diagnosis not present

## 2015-02-02 DIAGNOSIS — I519 Heart disease, unspecified: Secondary | ICD-10-CM | POA: Insufficient documentation

## 2015-02-02 DIAGNOSIS — Z9889 Other specified postprocedural states: Secondary | ICD-10-CM | POA: Insufficient documentation

## 2015-02-02 DIAGNOSIS — N182 Chronic kidney disease, stage 2 (mild): Secondary | ICD-10-CM | POA: Diagnosis not present

## 2015-02-02 DIAGNOSIS — Z8659 Personal history of other mental and behavioral disorders: Secondary | ICD-10-CM | POA: Insufficient documentation

## 2015-02-02 MED ORDER — GABAPENTIN 300 MG PO CAPS: 300.0000 mg | ORAL_CAPSULE | Freq: Two times a day (BID) | ORAL | Status: DC

## 2015-02-02 NOTE — ED Notes (Signed)
Pt c/o pain in right shoulder onset this am while laying in bed

## 2015-02-02 NOTE — Discharge Instructions (Signed)
Follow-up with your primary care doctor for pain control.   Shoulder Pain The shoulder is the joint that connects your arms to your body. The bones that form the shoulder joint include the upper arm bone (humerus), the shoulder blade (scapula), and the collarbone (clavicle). The top of the humerus is shaped like a ball and fits into a rather flat socket on the scapula (glenoid cavity). A combination of muscles and strong, fibrous tissues that connect muscles to bones (tendons) support your shoulder joint and hold the ball in the socket. Small, fluid-filled sacs (bursae) are located in different areas of the joint. They act as cushions between the bones and the overlying soft tissues and help reduce friction between the gliding tendons and the bone as you move your arm. Your shoulder joint allows a wide range of motion in your arm. This range of motion allows you to do things like scratch your back or throw a ball. However, this range of motion also makes your shoulder more prone to pain from overuse and injury. Causes of shoulder pain can originate from both injury and overuse and usually can be grouped in the following four categories: 1. Redness, swelling, and pain (inflammation) of the tendon (tendinitis) or the bursae (bursitis). 2. Instability, such as a dislocation of the joint. 3. Inflammation of the joint (arthritis). 4. Broken bone (fracture). HOME CARE INSTRUCTIONS  1. Apply ice to the sore area.  Put ice in a plastic bag.  Place a towel between your skin and the bag.  Leave the ice on for 15-20 minutes, 3-4 times per day for the first 2 days, or as directed by your health care provider. 2. Stop using cold packs if they do not help with the pain. 3. If you have a shoulder sling or immobilizer, wear it as long as your caregiver instructs. Only remove it to shower or bathe. Move your arm as little as possible, but keep your hand moving to prevent swelling. 4. Squeeze a soft ball or foam  pad as much as possible to help prevent swelling. 5. Only take over-the-counter or prescription medicines for pain, discomfort, or fever as directed by your caregiver. SEEK MEDICAL CARE IF:  1. Your shoulder pain increases, or new pain develops in your arm, hand, or fingers. 2. Your hand or fingers become cold and numb. 3. Your pain is not relieved with medicines. SEEK IMMEDIATE MEDICAL CARE IF:  1. Your arm, hand, or fingers are numb or tingling. 2. Your arm, hand, or fingers are significantly swollen or turn white or blue. MAKE SURE YOU:  1. Understand these instructions. 2. Will watch your condition. 3. Will get help right away if you are not doing well or get worse.   This information is not intended to replace advice given to you by your health care provider. Make sure you discuss any questions you have with your health care provider.   Document Released: 11/30/2004 Document Revised: 03/13/2014 Document Reviewed: 06/15/2014 Elsevier Interactive Patient Education 2016 [employer]  Shoulder Range of Motion Exercises Shoulder range of motion (ROM) exercises are designed to keep the shoulder moving freely. They are often recommended for people who have shoulder pain. MOVEMENT EXERCISE When you are able, do this exercise 5-6 days per week, or as told by your health care provider. Work toward doing 2 sets of 10 swings. Pendulum Exercise How To Do This Exercise Lying Down 5. Lie face-down on a bed with your abdomen close to the side of the bed.  6. Let your arm hang over the side of the bed. 7. Relax your shoulder, arm, and hand. 8. Slowly and gently swing your arm forward and back. Do not use your neck muscles to swing your arm. They should be relaxed. If you are struggling to swing your arm, have someone gently swing it for you. When you do this exercise for the first time, swing your arm at a 15 degree angle for 15 seconds, or swing your arm 10 times. As pain lessens over time,  increase the angle of the swing to 30-45 degrees. 9. Repeat steps 1-4 with the other arm. How To Do This Exercise While Standing 6. Stand next to a sturdy chair or table and hold on to it with your hand.  Bend forward at the waist.  Bend your knees slightly.  Relax your other arm and let it hang limp.  Relax the shoulder blade of the arm that is hanging and let it drop.  While keeping your shoulder relaxed, use body motion to swing your arm in small circles. The first time you do this exercise, swing your arm for about 30 seconds or 10 times. When you do it next time, swing your arm for a little longer.  Stand up tall and relax.  Repeat steps 1-7, this time changing the direction of the circles. 7. Repeat steps 1-8 with the other arm. STRETCHING EXERCISES Do these exercises 3-4 times per day on 5-6 days per week or as told by your health care provider. Work toward holding the stretch for 20 seconds. Stretching Exercise 1 4. Lift your arm straight out in front of you. 5. Bend your arm 90 degrees at the elbow (right angle) so your forearm goes across your body and looks like the letter "L." 6. Use your other arm to gently pull the elbow forward and across your body. 7. Repeat steps 1-3 with the other arm. Stretching Exercise 2 You will need a towel or rope for this exercise. 3. Bend one arm behind your back with the palm facing outward. 4. Hold a towel with your other hand. 5. Reach the arm that holds the towel above your head, and bend that arm at the elbow. Your wrist should be behind your neck. 6. Use your free hand to grab the free end of the towel. 7. With the higher hand, gently pull the towel up behind you. 8. With the lower hand, pull the towel down behind you. 9. Repeat steps 1-6 with the other arm. STRENGTHENING EXERCISES Do each of these exercises at four different times of day (sessions) every day or as told by your health care provider. To begin with, repeat each  exercise 5 times (repetitions). Work toward doing 3 sets of 12 repetitions or as told by your health care provider. Strengthening Exercise 1 You will need a light weight for this activity. As you grow stronger, you may use a heavier weight. 4. Standing with a weight in your hand, lift your arm straight out to the side until it is at the same height as your shoulder. 5. Bend your arm at 90 degrees so that your fingers are pointing to the ceiling. 6. Slowly raise your hand until your arm is straight up in the air. 7. Repeat steps 1-3 with the other arm. Strengthening Exercise 2 You will need a light weight for this activity. As you grow stronger, you may use a heavier weight. 1. Standing with a weight in your hand, gradually move your straight  arm in an arc, starting at your side, then out in front of you, then straight up over your head. 2. Gradually move your other arm in an arc, starting at your side, then out in front of you, then straight up over your head. 3. Repeat steps 1-2 with the other arm. Strengthening Exercise 3 You will need an elastic band for this activity. As you grow stronger, gradually increase the size of the bands or increase the number of bands that you use at one time. 1. While standing, hold an elastic band in one hand and raise that arm up in the air. 2. With your other hand, pull down the band until that hand is by your side. 3. Repeat steps 1-2 with the other arm.   This information is not intended to replace advice given to you by your health care provider. Make sure you discuss any questions you have with your health care provider.   Document Released: 11/19/2002 Document Revised: 07/07/2014 Document Reviewed: 02/16/2014 Elsevier Interactive Patient Education [employer]
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
## 2015-02-02 NOTE — ED Notes (Signed)
Called pt name, no answer.
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
## 2015-02-17 ENCOUNTER — Other Ambulatory Visit: Payer: Self-pay

## 2015-02-17 DIAGNOSIS — Z1231 Encounter for screening mammogram for malignant neoplasm of breast: Secondary | ICD-10-CM

## 2015-02-18 ENCOUNTER — Ambulatory Visit: Payer: Medicare Other | Admitting: Cardiology

## 2015-02-18 ENCOUNTER — Ambulatory Visit
Admission: RE | Admit: 2015-02-18 | Discharge: 2015-02-18 | Disposition: A | Discharge status: Home / Self Care | Payer: Medicare Other | Source: Ambulatory Visit

## 2015-02-18 DIAGNOSIS — Z1231 Encounter for screening mammogram for malignant neoplasm of breast: Secondary | ICD-10-CM

## 2015-02-22 ENCOUNTER — Other Ambulatory Visit: Payer: Self-pay | Admitting: Family Medicine

## 2015-02-22 DIAGNOSIS — R928 Other abnormal and inconclusive findings on diagnostic imaging of breast: Secondary | ICD-10-CM

## 2015-02-25 ENCOUNTER — Other Ambulatory Visit: Payer: Medicare Other

## 2015-03-23 ENCOUNTER — Emergency Department (HOSPITAL_COMMUNITY): Payer: Medicare Other

## 2015-03-23 ENCOUNTER — Encounter (HOSPITAL_COMMUNITY): Payer: Self-pay

## 2015-03-23 ENCOUNTER — Emergency Department (HOSPITAL_COMMUNITY)
Admission: EM | Admit: 2015-03-23 | Discharge: 2015-03-23 | Disposition: A | Discharge status: Home / Self Care | Payer: Medicare Other | Attending: Emergency Medicine | Admitting: Emergency Medicine

## 2015-03-23 DIAGNOSIS — R109 Unspecified abdominal pain: Secondary | ICD-10-CM

## 2015-03-23 DIAGNOSIS — Z7982 Long term (current) use of aspirin: Secondary | ICD-10-CM | POA: Insufficient documentation

## 2015-03-23 DIAGNOSIS — N183 Chronic kidney disease, stage 3 (moderate): Secondary | ICD-10-CM | POA: Insufficient documentation

## 2015-03-23 DIAGNOSIS — E785 Hyperlipidemia, unspecified: Secondary | ICD-10-CM | POA: Insufficient documentation

## 2015-03-23 DIAGNOSIS — I2510 Atherosclerotic heart disease of native coronary artery without angina pectoris: Secondary | ICD-10-CM | POA: Diagnosis not present

## 2015-03-23 DIAGNOSIS — G8929 Other chronic pain: Secondary | ICD-10-CM | POA: Insufficient documentation

## 2015-03-23 DIAGNOSIS — K625 Hemorrhage of anus and rectum: Secondary | ICD-10-CM | POA: Insufficient documentation

## 2015-03-23 DIAGNOSIS — R011 Cardiac murmur, unspecified: Secondary | ICD-10-CM | POA: Diagnosis not present

## 2015-03-23 DIAGNOSIS — K5900 Constipation, unspecified: Secondary | ICD-10-CM

## 2015-03-23 DIAGNOSIS — R195 Other fecal abnormalities: Secondary | ICD-10-CM | POA: Diagnosis present

## 2015-03-23 DIAGNOSIS — I129 Hypertensive chronic kidney disease with stage 1 through stage 4 chronic kidney disease, or unspecified chronic kidney disease: Secondary | ICD-10-CM | POA: Diagnosis not present

## 2015-03-23 DIAGNOSIS — Z87891 Personal history of nicotine dependence: Secondary | ICD-10-CM | POA: Insufficient documentation

## 2015-03-23 DIAGNOSIS — Z79899 Other long term (current) drug therapy: Secondary | ICD-10-CM | POA: Insufficient documentation

## 2015-03-23 LAB — COMPREHENSIVE METABOLIC PANEL
ALBUMIN: 3.4 g/dL — AB (ref 3.5–5.0)
ALT: 22 U/L (ref 14–54)
ANION GAP: 9 (ref 5–15)
AST: 21 U/L (ref 15–41)
Alkaline Phosphatase: 78 U/L (ref 38–126)
BILIRUBIN TOTAL: 0.2 mg/dL — AB (ref 0.3–1.2)
BUN: 27 mg/dL — ABNORMAL HIGH (ref 6–20)
CO2: 26 mmol/L (ref 22–32)
Calcium: 10.1 mg/dL (ref 8.9–10.3)
Chloride: 102 mmol/L (ref 101–111)
Creatinine, Ser: 1.36 mg/dL — ABNORMAL HIGH (ref 0.44–1.00)
GFR calc Af Amer: 47 mL/min — ABNORMAL LOW (ref >60)
GFR calc non Af Amer: 40 mL/min — ABNORMAL LOW (ref >60)
GLUCOSE: 116 mg/dL — AB (ref 65–99)
POTASSIUM: 4.8 mmol/L (ref 3.5–5.1)
SODIUM: 137 mmol/L (ref 135–145)
Total Protein: 6.9 g/dL (ref 6.5–8.1)

## 2015-03-23 LAB — CBC WITH DIFFERENTIAL/PLATELET
BASOS ABS: 0 10*3/uL (ref 0.0–0.1)
BASOS PCT: 0 %
EOS ABS: 0.2 10*3/uL (ref 0.0–0.7)
Eosinophils Relative: 2 %
HEMATOCRIT: 36.2 % (ref 36.0–46.0)
HEMOGLOBIN: 11.9 g/dL — AB (ref 12.0–15.0)
Lymphocytes Relative: 35 %
Lymphs Abs: 2.5 10*3/uL (ref 0.7–4.0)
MCH: 29.9 pg (ref 26.0–34.0)
MCHC: 32.9 g/dL (ref 30.0–36.0)
MCV: 91 fL (ref 78.0–100.0)
MONO ABS: 0.5 10*3/uL (ref 0.1–1.0)
Monocytes Relative: 7 %
NEUTROS ABS: 3.9 10*3/uL (ref 1.7–7.7)
NEUTROS PCT: 56 %
Platelets: 290 10*3/uL (ref 150–400)
RBC: 3.98 MIL/uL (ref 3.87–5.11)
RDW: 13.5 % (ref 11.5–15.5)
WBC: 7 10*3/uL (ref 4.0–10.5)

## 2015-03-23 LAB — POC OCCULT BLOOD, ED: FECAL OCCULT BLD: POSITIVE — AB

## 2015-03-23 MED ORDER — POLYETHYLENE GLYCOL 3350 17 G PO PACK: 17.0000 g | PACK | Freq: Every day | ORAL | Status: DC

## 2015-03-23 MED ORDER — IOHEXOL 300 MG/ML  SOLN
100.0000 mL | Freq: Once | INTRAMUSCULAR | Status: AC | PRN
  Administered 2015-03-23: 100 mL via INTRAVENOUS

## 2015-03-23 NOTE — Discharge Instructions (Signed)
Take neuroleptics as prescribed for constipation. Please follow up with her primary care doctor regarding your rectal bleeding. I suspect it is most likely coming from a hemorrhoid. Try sitz bath and over-the-counter medications topically.   Anal Fissure, Adult An anal fissure is a small tear or crack in the skin around the anus. Bleeding from a fissure usually stops on its own within a few minutes. However, bleeding will often occur again with each bowel movement until the crack heals. CAUSES This condition may be caused by:  Passing large, hard stool (feces).  Frequent diarrhea.  Constipation.  Inflammatory bowel disease (Crohn disease or ulcerative colitis).  Infections.  Anal sex. SYMPTOMS Symptoms of this condition include:  Bleeding from the rectum.  Small amounts of blood seen on your stool, on toilet paper, or in the toilet after a bowel movement.  Painful bowel movements.  Itching or irritation around the anus. DIAGNOSIS A health care provider may diagnose this condition by closely examining the anal area. An anal fissure can usually be seen with careful inspection. In some cases, a rectal exam may be performed, or a short tube (anoscope) may be used to examine the anal canal. TREATMENT Treatment for this condition may include:  Taking steps to avoid constipation. This may include making changes to your diet, such as increasing your intake of fiber or fluid.  Taking fiber supplements. These supplements can soften your stool to help make bowel movements easier. Your health care provider may also prescribe a stool softener if your stool is often hard.  Taking sitz baths. This may help to heal the tear.  Using medicated creams or ointments. These may be prescribed to lessen discomfort. HOME CARE INSTRUCTIONS Eating and Drinking  Avoid foods that may be constipating, such as bananas and dairy products.  Drink enough fluid to keep your urine clear or pale  yellow.  Maintain a diet that is high in fruits, whole grains, and vegetables. General Instructions  Keep the anal area as clean and dry as possible.  Take sitz baths as told by your health care provider. Do not use soap in the sitz baths.  Take over-the-counter and prescription medicines only as told by your health care provider.  Use creams or ointments only as told by your health care provider.  Keep all follow-up visits as told by your health care provider. This is important. SEEK MEDICAL CARE IF:  You have more bleeding.  You have a fever.  You have diarrhea that is mixed with blood.  You continue to have pain.  Your problem is getting worse rather than better.   This information is not intended to replace advice given to you by your health care provider. Make sure you discuss any questions you have with your health care provider.   Document Released: 02/20/2005 Document Revised: 11/11/2014 Document Reviewed: 05/18/2014 Elsevier Interactive Patient Education 2016 [employer]  Abdominal Pain, Adult Many things can cause abdominal pain. Usually, abdominal pain is not caused by a disease and will improve without treatment. It can often be observed and treated at home. Your health care provider will do a physical exam and possibly order blood tests and X-rays to help determine the seriousness of your pain. However, in many cases, more time must pass before a clear cause of the pain can be found. Before that point, your health care provider may not know if you need more testing or further treatment. HOME CARE INSTRUCTIONS Monitor your abdominal pain for any changes. The following  actions may help to alleviate any discomfort you are experiencing:  Only take over-the-counter or prescription medicines as directed by your health care provider.  Do not take laxatives unless directed to do so by your health care provider.  Try a clear liquid diet (broth, tea, or water) as  directed by your health care provider. Slowly move to a bland diet as tolerated. SEEK MEDICAL CARE IF:  You have unexplained abdominal pain.  You have abdominal pain associated with nausea or diarrhea.  You have pain when you urinate or have a bowel movement.  You experience abdominal pain that wakes you in the night.  You have abdominal pain that is worsened or improved by eating food.  You have abdominal pain that is worsened with eating fatty foods.  You have a fever. SEEK IMMEDIATE MEDICAL CARE IF:  Your pain does not go away within 2 hours.  You keep throwing up (vomiting).  Your pain is felt only in portions of the abdomen, such as the right side or the left lower portion of the abdomen.  You pass bloody or black tarry stools. MAKE SURE YOU:  Understand these instructions.  Will watch your condition.  Will get help right away if you are not doing well or get worse.   This information is not intended to replace advice given to you by your health care provider. Make sure you discuss any questions you have with your health care provider.   Document Released: 11/30/2004 Document Revised: 11/11/2014 Document Reviewed: 10/30/2012 Elsevier Interactive Patient Education [employer]

## 2015-03-23 NOTE — ED Provider Notes (Signed)
CSN: [id number]     Arrival date & time 03/23/15  1606 History   First MD Initiated Contact with Patient 03/23/15 1612     Chief Complaint  Patient presents with  . Blood In Stools     (Consider location/radiation/quality/duration/timing/severity/associated sxs/prior Treatment) HPI Erin Riley is a 66 y.o. female with history of coronary disease, CHF, chronic kidney disease, chronic back pain, schizophrenia, presents to emergency department complaining of constipation and rectal bleeding. Patient states she has had constipation for several days. She states she tried to drink orange juice, states today he had 2 loose bowel movements. She states when she wiped after her bowel movement she noticed blood on the tissue. She does not know if there was any blood in her stool and the commode because she did not check. She states she has had some rectal pain with a bowel movement as well. She states "my rectum is sore." She denies any history of prior GI bleeds. She denies any abdominal pain unless you press on her abdomen. She denies any fever or chills. No nausea or vomiting. No back pain other than her chronic pain. No urinary symptoms.  Past Medical History  Diagnosis Date  . Hypertension   . Left ventricular diastolic dysfunction, NYHA class 1 March 20    Severe concentric LVH (likely hypertensive), mild aortic sclerosis. Grade 1 diastolic dysfunction  . Coronary artery disease 04/2009    50% stenosis in the perforator of LAD; catheterization was for an abnormal Myoview in January 2000 showing anterior and inferolateral ischemia.  [name] PAD (peripheral artery disease) [facility]) March 2013    Lower extremity Dopplers: R. SFA 50-60%, R. PTA proximally occluded with distal reconstitution;; L. common iliac ~50%, L. SFA 50-70% stenosis, L. PTA < 50%  . Hyperlipidemia   . Chronic kidney disease (CKD), stage II (mild)     Class I-II  . History of schizophrenia     However I am not sure about the validity of  this. She is not on any medications.  . Chronic back pain   . 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 At least 3 subsequent echocardiograms have shown improved/normal EF with moderate to severe concentric LVH and diastolic dysfunction with LVOT/intracavitary gradient  . Heart murmur     Previously reported moderate to severe MR. Most recent echo in May 2014 revealed mild MR.  [name] Hypertensive hypertrophic cardiomyopathy: NYHA class II:  Echo: Severe concentric LVH with LV OT gradient; essentially preserved EF with diastolic dysfunction 02/15/2013    Not significantly symptomatic; echocardiogram from May 2014: Severe concentric LVH with EF 50-55%. Somewhat unbelievably grade 1 diastolic dysfunction with moderate LE dilation. Trivial MR noted.   Past Surgical History  Procedure Laterality Date  . Bunionectomy    . Nm myocar perf wall motion  03/2009    Persantine; EF 51%-both anterior and inferolateral ischemia  . Lower extremity doppler  05/29/2011    right SFA 50% to 59% diameter reduction,right posterior tibal atreery occlusive disease,reconstituting distally, left common illiac<50%,left SFA 50 to70%,left post. tibial <50%  . Doppler echocardiography  May 2014    EF 50-55%; severe concentric LVH; only grade 1 diastolic dysfunction. Mild aortic sclerosis - with LVOT /intracavitary gradient of roughly 20 mmHg mean. Mild to moderately dilated LA;; previously reported MR not seen  . Doppler echocardiography  02/02/2009    mod to severe regurg  mitral, EF 20 TO 25%, tricuspid severe regurg. ,pulmonic moderate regurg.  . Cardiac catheterization  04/09/2009    rt and lt cath -non obstructive coronary disease   . Carotid doppler  05/29/2011    left bulb/prox ICA moderate amtfibrous plaque with no evidence significant reduction.,right bulb /proximal ICA normal patency   Family  History  Problem Relation Age of Onset  . Hypertension Mother    Social History  Substance Use Topics  . Smoking status: Former Smoker    Quit date: 05/14/2002  . Smokeless tobacco: Never Used  . Alcohol Use: No   OB History    Gravida Para Term Preterm AB TAB SAB Ectopic Multiple Living   3 3 3       3      Review of Systems  Constitutional: Negative for fever and chills.  Respiratory: Negative for cough, chest tightness and shortness of breath.   Cardiovascular: Negative for chest pain, palpitations and leg swelling.  Gastrointestinal: Positive for abdominal pain, diarrhea, constipation, blood in stool and rectal pain. Negative for nausea and vomiting.  Genitourinary: Negative for dysuria and flank pain.  Musculoskeletal: Negative for myalgias, arthralgias, neck pain and neck stiffness.  Skin: Negative for rash.  Neurological: Negative for dizziness, weakness and headaches.  All other systems reviewed and are negative.     Allergies  Review of patient's allergies indicates no known allergies.  Home Medications   Prior to Admission medications   Medication Sig Start Date End Date Taking? Authorizing Provider  amLODipine (NORVASC) 10 MG tablet Take 10 mg by mouth daily.   Yes Historical Provider, MD  aspirin EC 81 MG tablet Take 81 mg by mouth daily.   Yes Historical Provider, MD  atorvastatin (LIPITOR) 40 MG tablet Take 1 tablet (40 mg total) by mouth daily. 06/08/14  Yes Marykay Lex, MD  carvedilol (COREG) 25 MG tablet Take 1 tablet (25 mg total) by mouth 2 (two) times daily with a meal. 03/13/14  Yes Marykay Lex, MD  digoxin (LANOXIN) 0.125 MG tablet Take 0.125 mg by mouth daily.   Yes Historical Provider, MD  gabapentin (NEURONTIN) 300 MG capsule Take 1 capsule (300 mg total) by mouth 2 (two) times daily. 02/02/15  Yes Stevi Barrett, PA-C  hydrALAZINE (APRESOLINE) 50 MG tablet take 1 tablet by mouth three times a day Patient taking differently: Take 50 mg by mouth 3  (three) times daily.  03/13/14  Yes Marykay Lex, MD  isosorbide mononitrate (IMDUR) 60 MG 24 hr tablet Take 90 mg by mouth daily.   Yes Historical Provider, MD  methocarbamol (ROBAXIN) 500 MG tablet Take 1 tablet (500 mg total) by mouth 3 (three) times daily as needed for muscle spasms (back spasm). 09/28/14  Yes Tia Alert, MD  omeprazole (PRILOSEC) 20 MG capsule Take 20 mg by mouth daily.  09/22/13  Yes Historical Provider, MD  spironolactone (ALDACTONE) 25 MG tablet Take 12.5 mg by mouth daily.   Yes Historical Provider, MD  sucralfate (CARAFATE) 1 G tablet Take 1 g by mouth 4 (four) times daily. 09/25/13  Yes Rolland Porter, MD   BP 130/75 mmHg  Pulse 73  Temp(Src) 98.3 F (36.8 C) (Oral)  Resp 18  Ht 5\' 7"  (1.702 m)  Wt 96.163 kg  BMI 33.20 kg/m2  SpO2 96%  LMP 05/10/2013 Physical Exam  Constitutional: She is oriented to person, place, and time. She appears well-developed and well-nourished. No distress.  HENT:  Head: Normocephalic.  Eyes: Conjunctivae are  normal.  Neck: Neck supple.  Cardiovascular: Normal rate, regular rhythm and normal heart sounds.   Pulmonary/Chest: Effort normal and breath sounds normal. No respiratory distress. She has no wheezes. She has no rales.  Abdominal: Soft. Bowel sounds are normal. She exhibits no distension. There is tenderness. There is no rebound.  Diffuse tenderness  Genitourinary:  Nonthrombosed external hemorrhoids to the rectum, one with bright red blood on it. Tender to palpation. No impaction  Musculoskeletal: She exhibits no edema.  Neurological: She is alert and oriented to person, place, and time.  Skin: Skin is warm and dry.  Psychiatric: She has a normal mood and affect. Her behavior is normal.  Nursing note and vitals reviewed.   ED Course  Procedures (including critical care time) Labs Review Labs Reviewed  CBC WITH DIFFERENTIAL/PLATELET - Abnormal; Notable for the following:    Hemoglobin 11.9 (*)    All other components  within normal limits  COMPREHENSIVE METABOLIC PANEL - Abnormal; Notable for the following:    Glucose, Bld 116 (*)    BUN 27 (*)    Creatinine, Ser 1.36 (*)    Albumin 3.4 (*)    Total Bilirubin 0.2 (*)    GFR calc non Af Amer 40 (*)    GFR calc Af Amer 47 (*)    All other components within normal limits  POC OCCULT BLOOD, ED - Abnormal; Notable for the following:    Fecal Occult Bld POSITIVE (*)    All other components within normal limits    Imaging Review Ct Abdomen Pelvis W Contrast  03/23/2015  CLINICAL DATA:  Hematochezia. EXAM: CT ABDOMEN AND PELVIS WITH CONTRAST TECHNIQUE: Multidetector CT imaging of the abdomen and pelvis was performed using the standard protocol following bolus administration of intravenous contrast. CONTRAST:  OMNIPAQUE IOHEXOL 300 MG/ML  SOLN COMPARISON:  December 28, 2014 and April 23, 2010 FINDINGS: Lower chest: There is a small area of airspace consolidation in the medial aspect of the posterior segment of the right lower lobe. Lung bases otherwise appear clear. Hepatobiliary: No focal liver lesions are identified. Gallbladder wall is not appreciably thickened. There is no biliary duct dilatation. Pancreas: There is no pancreatic mass or inflammatory focus. Spleen: No splenic lesions are identified. There are small splenules inferior to the spleen. Adrenals/Urinary Tract: Adrenals appear unremarkable bilaterally. There is a cyst arising from the lower pole of the left kidney measuring 1.6 x 1.0 cm. No hydronephrosis on either side. There is no renal or ureteral calculus on either side. The urinary bladder is midline with wall thickness within normal limits. Several small phleboliths are noted in the pelvis. Stomach/Bowel: There are multiple sigmoid diverticula without diverticulitis. There is no bowel wall or mesenteric thickening. No bowel obstruction. No free air or portal venous air. Vascular/Lymphatic: There is atherosclerotic calcification in the aorta  and iliac arteries. No aneurysm is apparent. No focal vascular lesions are identified apart from the calcification in the aorta and iliac arteries. There is no demonstrable adenopathy in the abdomen or pelvis. Reproductive: Uterus is anteverted. There is a persistent mass with cystic attenuation in the right adnexal region measuring 5.1 x 3.1 cm, stable. No other pelvic masses seen. No pelvic fluid collections are appreciable. Other: The appendix appears normal. There is a small ventral hernia containing only fat. No abscess or ascites is seen in the abdomen or pelvis. Musculoskeletal: The patient has had posterior screw and plate fixation at L4, L5, and S1 bilaterally. There are no blastic or  lytic bone lesions. There is no intramuscular or abdominal wall lesions. 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 Question simple ovarian cyst versus ovarian cystadenoma. Sigmoid diverticula without diverticulitis. No bowel obstruction. No abscess. Appendix appears normal. Small area of infiltrate medial right base. Small ventral hernia containing only fat. Postoperative change in lumbar spine. Electronically Signed   By: Bretta Bang III M.D.   On: 03/23/2015 19:28   Dg Abd 2 Views  03/23/2015  CLINICAL DATA:  Rectal bleeding ; constipation EXAM: ABDOMEN - 2 VIEW COMPARISON:  CT abdomen and pelvis December 28, 2014 FINDINGS: Supine and upright images were obtained. There is moderate stool throughout the colon. There is no bowel dilatation or air-fluid level suggesting obstruction. No free air. There are vascular calcifications in the pelvis. There is postoperative change in the lower lumbar spine. Lung bases are clear. IMPRESSION: Moderate stool in colon. No obstruction or free air. Lung bases clear. Electronically Signed   By: Bretta Bang III M.D.   On: 03/23/2015 17:31   I have personally reviewed and evaluated these images and lab results as part of my medical decision-making.   EKG  Interpretation None      MDM   Final diagnoses:  Rectal bleeding  Abdominal pain, unspecified abdominal location  Constipation, unspecified constipation type    patient with abdominal pain, diarrhea, constipation, bright red blood on tissue when wiping with some rectal irritation. Exam shows external hemorrhoid with some blood. Abdomen is diffusely tender. Discussed with Dr. Al Decant, will do CT.   8:06 PM CT is negative other than chronic findings. Also showed possible small area of infiltrate in the right lung base. Patient denies any cough or congestion. No symptoms to suggest pneumonia. Will defer on treatment at this time. Will start patient on Mirapex. Sitz bath for rectal irritation. Follow-up with primary care doctor. Vital signs are normal.   Filed Vitals:   03/23/15 1700 03/23/15 1745 03/23/15 1800 03/23/15 1815  BP: 130/75 141/83 133/84 144/80  Pulse: 73 64 70 67  Temp:      TempSrc:      Resp: 18     Height:      Weight:      SpO2: 96% 98% 97% 95%     Jaynie Crumble, PA-C 03/23/15 2007  Leta Baptist, MD 03/31/15 2152

## 2015-03-23 NOTE — ED Notes (Signed)
Patient transported to CT 

## 2015-03-23 NOTE — ED Notes (Signed)
Pt. Coming from home via [facility] c/o bright red blood in stools. Pt. Feeling constipated today. While using the bathroom patient noted bright red frank blood in stool. Pt. Hx of constipation. Pt. Denies hx of bowel obstruction or GI bleed. Pt. Aox4. Pt. Vitals en route [facility].

## 2015-03-23 NOTE — ED Notes (Signed)
Pt left with all her belongings and ambulated out of the treatment area.  

## 2015-03-24 ENCOUNTER — Encounter (HOSPITAL_COMMUNITY): Payer: Self-pay | Admitting: Emergency Medicine

## 2015-03-24 ENCOUNTER — Emergency Department (HOSPITAL_COMMUNITY)
Admission: EM | Admit: 2015-03-24 | Discharge: 2015-03-24 | Disposition: A | Discharge status: Home / Self Care | Payer: Medicare Other | Attending: Emergency Medicine | Admitting: Emergency Medicine

## 2015-03-24 DIAGNOSIS — G8929 Other chronic pain: Secondary | ICD-10-CM | POA: Insufficient documentation

## 2015-03-24 DIAGNOSIS — Z79899 Other long term (current) drug therapy: Secondary | ICD-10-CM | POA: Diagnosis not present

## 2015-03-24 DIAGNOSIS — K644 Residual hemorrhoidal skin tags: Secondary | ICD-10-CM | POA: Diagnosis not present

## 2015-03-24 DIAGNOSIS — Z9889 Other specified postprocedural states: Secondary | ICD-10-CM | POA: Diagnosis not present

## 2015-03-24 DIAGNOSIS — Z87891 Personal history of nicotine dependence: Secondary | ICD-10-CM | POA: Diagnosis not present

## 2015-03-24 DIAGNOSIS — N182 Chronic kidney disease, stage 2 (mild): Secondary | ICD-10-CM | POA: Diagnosis not present

## 2015-03-24 DIAGNOSIS — R011 Cardiac murmur, unspecified: Secondary | ICD-10-CM | POA: Insufficient documentation

## 2015-03-24 DIAGNOSIS — Z7982 Long term (current) use of aspirin: Secondary | ICD-10-CM | POA: Insufficient documentation

## 2015-03-24 DIAGNOSIS — Z8659 Personal history of other mental and behavioral disorders: Secondary | ICD-10-CM | POA: Insufficient documentation

## 2015-03-24 DIAGNOSIS — K649 Unspecified hemorrhoids: Secondary | ICD-10-CM | POA: Diagnosis present

## 2015-03-24 DIAGNOSIS — I2510 Atherosclerotic heart disease of native coronary artery without angina pectoris: Secondary | ICD-10-CM | POA: Diagnosis not present

## 2015-03-24 DIAGNOSIS — E669 Obesity, unspecified: Secondary | ICD-10-CM | POA: Diagnosis not present

## 2015-03-24 DIAGNOSIS — E785 Hyperlipidemia, unspecified: Secondary | ICD-10-CM | POA: Insufficient documentation

## 2015-03-24 DIAGNOSIS — I129 Hypertensive chronic kidney disease with stage 1 through stage 4 chronic kidney disease, or unspecified chronic kidney disease: Secondary | ICD-10-CM | POA: Insufficient documentation

## 2015-03-24 MED ORDER — TUCKS 50 % EX PADS: MEDICATED_PAD | CUTANEOUS | Status: DC

## 2015-03-24 MED ORDER — SENNOSIDES-DOCUSATE SODIUM 8.6-50 MG PO TABS: 1.0000 | ORAL_TABLET | Freq: Every day | ORAL | Status: DC

## 2015-03-24 NOTE — ED Provider Notes (Signed)
CSN: [id number]     Arrival date & time 03/24/15  1552 History  By signing my name below, I, Marica Otter, attest that this documentation has been prepared under the direction and in the presence of  [employer], PA-C. Electronically Signed: Marica Otter, ED Scribe. 03/24/2015. 5:52 PM.   Chief Complaint  Patient presents with  . Hemorrhoids   The history is provided by the patient. No language interpreter was used.   PCP: Billee Cashing, MD HPI Comments: Erin Riley is a 65 y.o. female, with PMHx noted below, who presents to the Emergency Department complaining of persistent, worsening hemorrhoids with associated rectal bleeding and pain onset several days ago. Pt specifies that she noted blood in her toilet paper after a BM today; pt cannot verify if there was blood present in her stool. Pt denies taking any measures at home to alleviate her Sx. Pt further notes she was treated for the same at the ED yesterday.   Past Medical History  Diagnosis Date  . Hypertension   . Left ventricular diastolic dysfunction, NYHA class 1 March 20    Severe concentric LVH (likely hypertensive), mild aortic sclerosis. Grade 1 diastolic dysfunction  . Coronary artery disease 04/2009    50% stenosis in the perforator of LAD; catheterization was for an abnormal Myoview in January 2000 showing anterior and inferolateral ischemia.  [name] PAD (peripheral artery disease) [facility]) March 2013    Lower extremity Dopplers: R. SFA 50-60%, R. PTA proximally occluded with distal reconstitution;; L. common iliac ~50%, L. SFA 50-70% stenosis, L. PTA < 50%  . Hyperlipidemia   . Chronic kidney disease (CKD), stage II (mild)     Class I-II  . History of schizophrenia     However I am not sure about the validity of this. She is not on any medications.  . Chronic back pain   . 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 At least 3 subsequent echocardiograms have shown improved/normal EF with moderate to severe concentric LVH and diastolic dysfunction with LVOT/intracavitary gradient  . Heart murmur     Previously reported moderate to severe MR. Most recent echo in May 2014 revealed mild MR.  [name] Hypertensive hypertrophic cardiomyopathy: NYHA class II:  Echo: Severe concentric LVH with LV OT gradient; essentially preserved EF with diastolic dysfunction 02/15/2013    Not significantly symptomatic; echocardiogram from May 2014: Severe concentric LVH with EF 50-55%. Somewhat unbelievably grade 1 diastolic dysfunction with moderate LE dilation. Trivial MR noted.   Past Surgical History  Procedure Laterality Date  . Bunionectomy    . Nm myocar perf wall motion  03/2009    Persantine; EF 51%-both anterior and inferolateral ischemia  . Lower extremity doppler  05/29/2011    right SFA 50% to 59% diameter reduction,right posterior tibal atreery occlusive disease,reconstituting distally, left common illiac<50%,left SFA 50 to70%,left post. tibial <50%  . Doppler echocardiography  May 2014    EF 50-55%; severe concentric LVH; only grade 1 diastolic dysfunction. Mild aortic sclerosis - with LVOT /intracavitary gradient of roughly 20 mmHg mean. Mild to moderately dilated LA;; previously reported MR not seen  . Doppler echocardiography  02/02/2009    mod to severe regurg mitral, EF 20 TO 25%, tricuspid severe regurg. ,pulmonic moderate regurg.  . Cardiac catheterization  04/09/2009    rt and lt cath -non obstructive coronary disease   . Carotid  doppler  05/29/2011    left bulb/prox ICA moderate amtfibrous plaque with no evidence significant reduction.,right bulb /proximal ICA normal patency   Family History  Problem Relation Age of Onset  . Hypertension Mother    Social History  Substance Use Topics  . Smoking status: Former Smoker    Quit date: 05/14/2002  . Smokeless tobacco: Never Used  .  Alcohol Use: No   OB History    Gravida Para Term Preterm AB TAB SAB Ectopic Multiple Living   Review of Systems    Allergies  Review of patient's allergies indicates no known allergies.  Home Medications   Prior to Admission medications   Medication Sig Start Date End Date Taking? Authorizing Provider  amLODipine (NORVASC) 10 MG tablet Take 10 mg by mouth daily.    Historical Provider, MD  aspirin EC 81 MG tablet Take 81 mg by mouth daily.    Historical Provider, MD  atorvastatin (LIPITOR) 40 MG tablet Take 1 tablet (40 mg total) by mouth daily. 06/08/14   Marykay Lex, MD  carvedilol (COREG) 25 MG tablet Take 1 tablet (25 mg total) by mouth 2 (two) times daily with a meal. 03/13/14   Marykay Lex, MD  digoxin (LANOXIN) 0.125 MG tablet Take 0.125 mg by mouth daily.    Historical Provider, MD  gabapentin (NEURONTIN) 300 MG capsule Take 1 capsule (300 mg total) by mouth 2 (two) times daily. 02/02/15   Stevi Barrett, PA-C  hydrALAZINE (APRESOLINE) 50 MG tablet take 1 tablet by mouth three times a day Patient taking differently: Take 50 mg by mouth 3 (three) times daily.  03/13/14   Marykay Lex, MD  isosorbide mononitrate (IMDUR) 60 MG 24 hr tablet Take 90 mg by mouth daily.    Historical Provider, MD  methocarbamol (ROBAXIN) 500 MG tablet Take 1 tablet (500 mg total) by mouth 3 (three) times daily as needed for muscle spasms (back spasm). 09/28/14   Tia Alert, MD  omeprazole (PRILOSEC) 20 MG capsule Take 20 mg by mouth daily.  09/22/13   Historical Provider, MD  polyethylene glycol (MIRALAX / GLYCOLAX) packet Take 17 g by mouth daily. 03/23/15   Tatyana Kirichenko, PA-C  senna-docusate (SENOKOT-S) 8.6-50 MG tablet Take 1 tablet by mouth daily. 03/24/15   Vane Yapp, PA-C  spironolactone (ALDACTONE) 25 MG tablet Take 12.5 mg by mouth daily.    Historical Provider, MD  sucralfate (CARAFATE) 1 G tablet Take 1 g by mouth 4 (four) times daily. 09/25/13   Rolland Porter, MD  Witch Hazel (TUCKS) 50 % PADS As directed 03/24/15   Wynetta Emery, PA-C   Triage Vitals: BP 165/89 mmHg  Pulse 96  Temp(Src) 97.6 F (36.4 C) (Oral)  Resp 18  SpO2 95%  LMP 05/10/2013 Physical Exam  Constitutional: She is oriented to person, place, and time. She appears well-developed and well-nourished. No distress.  Obese  HENT:  Head: Normocephalic and atraumatic.  Mouth/Throat: Oropharynx is clear and moist.  Eyes: Conjunctivae and EOM are normal. Pupils are equal, round, and reactive to light.  Neck: Normal range of motion.  Cardiovascular: Normal rate, regular rhythm and intact distal pulses.   Pulmonary/Chest: Effort normal and breath sounds normal. No stridor.  Abdominal: Soft. She exhibits no distension. There is no tenderness.  Genitourinary:     Musculoskeletal: Normal range of motion.  Neurological: She is alert and oriented to person, place,  and time.  Skin: She is not diaphoretic.  Psychiatric: She has a normal mood and affect.  Nursing note and vitals reviewed.   ED Course  Procedures (including critical care time) DIAGNOSTIC STUDIES: Oxygen Saturation is 95% on ra, nl by my interpretation.    COORDINATION OF CARE: 5:46 PM: Discussed treatment plan which includes, tux hemorrhoid pads and stool softener, with pt at bedside; patient verbalizes understanding and agrees with treatment plan. Pt also encouraged to f/u with her PCP.    MDM   Final diagnoses:  External hemorrhoids with complication    Filed Vitals:   03/24/15 1657 03/24/15 1800  BP: 165/89 149/95  Pulse: 96 95  Temp: 97.6 F (36.4 C) 97.8 F (36.6 C)  TempSrc: Oral Oral  Resp: 18 20  SpO2: 95% 98%    AGAPITA SAVARINO is 65 y.o. female presenting with assisting discomfort and bleeding from hemorrhoids. Physical exam with external hemorrhoid, nonthrombosed. Patient had negative CT and extensive workup yesterday. Patient given prescription for Senokot and Tucks pads, advised to  follow closely with primary care.  This is a shared visit with the attending physician who personally evaluated the patient and agrees with the care plan.   Evaluation does not show pathology that would require ongoing emergent intervention or inpatient treatment. Pt is hemodynamically stable and mentating appropriately. Discussed findings and plan with patient/guardian, who agrees with care plan. All questions answered. Return precautions discussed and outpatient follow up given.   Discharge Medication List as of 03/24/2015  5:51 PM    START taking these medications   Details  senna-docusate (SENOKOT-S) 8.6-50 MG tablet Take 1 tablet by mouth daily., Starting 03/24/2015, Until Discontinued, Print    Witch Hazel (TUCKS) 50 % PADS As directed, Print         I personally performed the services described in this documentation, which was scribed in my presence. The recorded information has been reviewed and is accurate.   Wynetta Emery, PA-C 03/24/15 1832  Arby Barrette, MD 03/24/15 Barry Brunner

## 2015-03-24 NOTE — ED Notes (Signed)
Per patient, states she has hemorrhoids-was worked up at [facility] for the same thing-still having pain

## 2015-03-24 NOTE — ED Notes (Signed)
Pt called for triage without answer from the lobby.

## 2015-03-24 NOTE — ED Notes (Signed)
Pt taken to exit via w/c by RN.  Pt began yelling stating "ya'll gotta give me money for these Rx's.  You gotta give me a voucher or something."  Informed can request SW speak with her.  SW informed & stated "I will see her in a few minutes".  Pt informed & continued yelling "Epimenio Sarin have to give me a voucher or something.  You can't expect to send somebody home without their medicine.  I need this medicine."  Reitereated SW will come to speak with her.  Pt demanding to be taken to her office.  Informed SW will come out.

## 2015-03-24 NOTE — ED Notes (Signed)
Pt called,no answer.

## 2015-03-24 NOTE — Discharge Instructions (Signed)
Please follow with your primary care doctor in the next 2 days for a check-up. They must obtain records for further management.  ° °Do not hesitate to return to the Emergency Department for any new, worsening or concerning symptoms.  ° ° °Hemorrhoids °Hemorrhoids are swollen veins around the rectum or anus. There are two types of hemorrhoids:  °· Internal hemorrhoids. These occur in the veins just inside the rectum. They may poke through to the outside and become irritated and painful. °· External hemorrhoids. These occur in the veins outside the anus and can be felt as a painful swelling or hard lump near the anus. °CAUSES °· Pregnancy.   °· Obesity.   °· Constipation or diarrhea.   °· Straining to have a bowel movement.   °· Sitting for long periods on the toilet. °· Heavy lifting or other activity that caused you to strain. °· Anal intercourse. °SYMPTOMS  °· Pain.   °· Anal itching or irritation.   °· Rectal bleeding.   °· Fecal leakage.   °· Anal swelling.   °· One or more lumps around the anus.   °DIAGNOSIS  °Your caregiver may be able to diagnose hemorrhoids by visual examination. Other examinations or tests that may be performed include:  °· Examination of the rectal area with a gloved hand (digital rectal exam).   °· Examination of anal canal using a small tube (scope).   °· A blood test if you have lost a significant amount of blood. °· A test to look inside the colon (sigmoidoscopy or colonoscopy). °TREATMENT °Most hemorrhoids can be treated at home. However, if symptoms do not seem to be getting better or if you have a lot of rectal bleeding, your caregiver may perform a procedure to help make the hemorrhoids get smaller or remove them completely. Possible treatments include:  °· Placing a rubber band at the base of the hemorrhoid to cut off the circulation (rubber band ligation).   °· Injecting a chemical to shrink the hemorrhoid (sclerotherapy).   °· Using a tool to burn the hemorrhoid (infrared light  therapy).   °· Surgically removing the hemorrhoid (hemorrhoidectomy).   °· Stapling the hemorrhoid to block blood flow to the tissue (hemorrhoid stapling).   °HOME CARE INSTRUCTIONS  °· Eat foods with fiber, such as whole grains, beans, nuts, fruits, and vegetables. Ask your doctor about taking products with added fiber in them (fiber supplements). °· Increase fluid intake. Drink enough water and fluids to keep your urine clear or pale yellow.   °· Exercise regularly.   °· Go to the bathroom when you have the urge to have a bowel movement. Do not wait.   °· Avoid straining to have bowel movements.   °· Keep the anal area dry and clean. Use wet toilet paper or moist towelettes after a bowel movement.   °· Medicated creams and suppositories may be used or applied as directed.   °· Only take over-the-counter or prescription medicines as directed by your caregiver.   °· Take warm sitz baths for 15-20 minutes, 3-4 times a day to ease pain and discomfort.   °· Place ice packs on the hemorrhoids if they are tender and swollen. Using ice packs between sitz baths may be helpful.   °¨ Put ice in a plastic bag.   °¨ Place a towel between your skin and the bag.   °¨ Leave the ice on for 15-20 minutes, 3-4 times a day.   °· Do not use a donut-shaped pillow or sit on the toilet for long periods. This increases blood pooling and pain.   °SEEK MEDICAL CARE IF: °· You have increasing pain and swelling that is   not controlled by treatment or medicine. °· You have uncontrolled bleeding. °· You have difficulty or you are unable to have a bowel movement. °· You have pain or inflammation outside the area of the hemorrhoids. °MAKE SURE YOU: °· Understand these instructions. °· Will watch your condition. °· Will get help right away if you are not doing well or get worse. °  °This information is not intended to replace advice given to you by your health care provider. Make sure you discuss any questions you have with your health care  provider. °  °Document Released: 02/18/2000 Document Revised: 02/07/2012 Document Reviewed: 12/26/2011 °Elsevier Interactive Patient Education ©2016 [employer] ° °

## 2015-03-24 NOTE — Progress Notes (Signed)
[facility] received consult for medication assistance.  Patient with Medicare insurance.  Patient with prescription for Senekot and witch hazel pads.  EDCM spoke to patient in lobby.  EDCM explained to patient that we are unable to provided medication cost assistance for over the counter medications.  [facility] provided patient with phone number to [facility] and [facility] outpatient pharmacies.  Also suggested patient try [employer] store.  Patient continues to state that she does not have any money and "Somebody has got to help me with these meds.  I'm sure all of these people need help with their medications too."  [facility] again apologized to patient and again stated was unable to assist due to over the counter medications.  EDCM was unaware patient had Medicare insurance until checked patient chart.  Patient thanked [facility] for services.  No further EDCM needs at this time.

## 2015-03-24 NOTE — ED Notes (Signed)
Pt called from lobby without answer.  

## 2015-03-25 ENCOUNTER — Emergency Department (HOSPITAL_COMMUNITY)
Admission: EM | Admit: 2015-03-25 | Discharge: 2015-03-26 | Disposition: A | Discharge status: Other Acute Inpatient Hospital | Payer: Medicare Other | Attending: Emergency Medicine | Admitting: Emergency Medicine

## 2015-03-25 ENCOUNTER — Encounter (HOSPITAL_COMMUNITY): Payer: Self-pay | Admitting: Emergency Medicine

## 2015-03-25 DIAGNOSIS — R1030 Lower abdominal pain, unspecified: Secondary | ICD-10-CM | POA: Insufficient documentation

## 2015-03-25 DIAGNOSIS — E785 Hyperlipidemia, unspecified: Secondary | ICD-10-CM | POA: Diagnosis not present

## 2015-03-25 DIAGNOSIS — Z7982 Long term (current) use of aspirin: Secondary | ICD-10-CM | POA: Diagnosis not present

## 2015-03-25 DIAGNOSIS — Z9119 Patient's noncompliance with other medical treatment and regimen: Secondary | ICD-10-CM | POA: Diagnosis not present

## 2015-03-25 DIAGNOSIS — R011 Cardiac murmur, unspecified: Secondary | ICD-10-CM | POA: Insufficient documentation

## 2015-03-25 DIAGNOSIS — R45850 Homicidal ideations: Secondary | ICD-10-CM | POA: Diagnosis not present

## 2015-03-25 DIAGNOSIS — Z046 Encounter for general psychiatric examination, requested by authority: Secondary | ICD-10-CM | POA: Diagnosis present

## 2015-03-25 DIAGNOSIS — N182 Chronic kidney disease, stage 2 (mild): Secondary | ICD-10-CM | POA: Diagnosis not present

## 2015-03-25 DIAGNOSIS — Z87891 Personal history of nicotine dependence: Secondary | ICD-10-CM | POA: Diagnosis not present

## 2015-03-25 DIAGNOSIS — I129 Hypertensive chronic kidney disease with stage 1 through stage 4 chronic kidney disease, or unspecified chronic kidney disease: Secondary | ICD-10-CM | POA: Diagnosis not present

## 2015-03-25 DIAGNOSIS — Z9889 Other specified postprocedural states: Secondary | ICD-10-CM | POA: Insufficient documentation

## 2015-03-25 DIAGNOSIS — F209 Schizophrenia, unspecified: Secondary | ICD-10-CM | POA: Insufficient documentation

## 2015-03-25 DIAGNOSIS — Z79899 Other long term (current) drug therapy: Secondary | ICD-10-CM | POA: Diagnosis not present

## 2015-03-25 DIAGNOSIS — G8929 Other chronic pain: Secondary | ICD-10-CM | POA: Insufficient documentation

## 2015-03-25 DIAGNOSIS — I2510 Atherosclerotic heart disease of native coronary artery without angina pectoris: Secondary | ICD-10-CM | POA: Diagnosis not present

## 2015-03-25 LAB — URINALYSIS, ROUTINE W REFLEX MICROSCOPIC
BILIRUBIN URINE: NEGATIVE
GLUCOSE, UA: NEGATIVE mg/dL
Hgb urine dipstick: NEGATIVE
KETONES UR: NEGATIVE mg/dL
Leukocytes, UA: NEGATIVE
Nitrite: NEGATIVE
PH: 5 (ref 5.0–8.0)
Protein, ur: NEGATIVE mg/dL
Specific Gravity, Urine: 1.026 (ref 1.005–1.030)

## 2015-03-25 LAB — COMPREHENSIVE METABOLIC PANEL
ALBUMIN: 4 g/dL (ref 3.5–5.0)
ALT: 21 U/L (ref 14–54)
AST: 25 U/L (ref 15–41)
Alkaline Phosphatase: 78 U/L (ref 38–126)
Anion gap: 8 (ref 5–15)
BUN: 21 mg/dL — ABNORMAL HIGH (ref 6–20)
CHLORIDE: 102 mmol/L (ref 101–111)
CO2: 27 mmol/L (ref 22–32)
CREATININE: 1.11 mg/dL — AB (ref 0.44–1.00)
Calcium: 9.6 mg/dL (ref 8.9–10.3)
GFR calc non Af Amer: 52 mL/min — ABNORMAL LOW (ref >60)
GFR, EST AFRICAN AMERICAN: 60 mL/min — AB (ref >60)
GLUCOSE: 112 mg/dL — AB (ref 65–99)
Potassium: 4.5 mmol/L (ref 3.5–5.1)
SODIUM: 137 mmol/L (ref 135–145)
Total Bilirubin: 0.4 mg/dL (ref 0.3–1.2)
Total Protein: 7.7 g/dL (ref 6.5–8.1)

## 2015-03-25 LAB — RAPID URINE DRUG SCREEN, HOSP PERFORMED
AMPHETAMINES: NOT DETECTED
Barbiturates: NOT DETECTED
Benzodiazepines: NOT DETECTED
COCAINE: NOT DETECTED
OPIATES: NOT DETECTED
TETRAHYDROCANNABINOL: NOT DETECTED

## 2015-03-25 LAB — ACETAMINOPHEN LEVEL

## 2015-03-25 LAB — SALICYLATE LEVEL

## 2015-03-25 LAB — CBC
HEMATOCRIT: 38.2 % (ref 36.0–46.0)
HEMOGLOBIN: 12.4 g/dL (ref 12.0–15.0)
MCH: 29.5 pg (ref 26.0–34.0)
MCHC: 32.5 g/dL (ref 30.0–36.0)
MCV: 91 fL (ref 78.0–100.0)
Platelets: 307 10*3/uL (ref 150–400)
RBC: 4.2 MIL/uL (ref 3.87–5.11)
RDW: 13.6 % (ref 11.5–15.5)
WBC: 5.6 10*3/uL (ref 4.0–10.5)

## 2015-03-25 LAB — ETHANOL: Alcohol, Ethyl (B): <5 mg/dL (ref <5)

## 2015-03-25 MED ORDER — SUCRALFATE 1 G PO TABS
1.0000 g | ORAL_TABLET | Freq: Four times a day (QID) | ORAL | Status: DC
  Administered 2015-03-25 – 2015-03-26 (×4): 1 g via ORAL
  Filled 2015-03-25 (×4): qty 1

## 2015-03-25 MED ORDER — GABAPENTIN 300 MG PO CAPS
300.0000 mg | ORAL_CAPSULE | Freq: Two times a day (BID) | ORAL | Status: DC
  Administered 2015-03-25 – 2015-03-26 (×2): 300 mg via ORAL
  Filled 2015-03-25 (×2): qty 1

## 2015-03-25 MED ORDER — ATORVASTATIN CALCIUM 40 MG PO TABS
40.0000 mg | ORAL_TABLET | Freq: Every day | ORAL | Status: DC
  Administered 2015-03-26: 40 mg via ORAL
  Filled 2015-03-25: qty 1

## 2015-03-25 MED ORDER — PANTOPRAZOLE SODIUM 40 MG PO TBEC
40.0000 mg | DELAYED_RELEASE_TABLET | Freq: Every day | ORAL | Status: DC
  Administered 2015-03-25 – 2015-03-26 (×2): 40 mg via ORAL
  Filled 2015-03-25 (×2): qty 1

## 2015-03-25 MED ORDER — DIGOXIN 125 MCG PO TABS
0.1250 mg | ORAL_TABLET | Freq: Every day | ORAL | Status: DC
  Administered 2015-03-26: 0.125 mg via ORAL
  Filled 2015-03-25: qty 1

## 2015-03-25 MED ORDER — SPIRONOLACTONE 12.5 MG HALF TABLET
12.5000 mg | ORAL_TABLET | Freq: Every day | ORAL | Status: DC
  Administered 2015-03-26: 12.5 mg via ORAL
  Filled 2015-03-25: qty 1

## 2015-03-25 MED ORDER — ASPIRIN EC 81 MG PO TBEC
81.0000 mg | DELAYED_RELEASE_TABLET | Freq: Every day | ORAL | Status: DC
  Administered 2015-03-26: 81 mg via ORAL
  Filled 2015-03-25: qty 1

## 2015-03-25 MED ORDER — AMLODIPINE BESYLATE 10 MG PO TABS
10.0000 mg | ORAL_TABLET | Freq: Every day | ORAL | Status: DC
  Administered 2015-03-26: 10 mg via ORAL
  Filled 2015-03-25: qty 1

## 2015-03-25 MED ORDER — HYDRALAZINE HCL 50 MG PO TABS
50.0000 mg | ORAL_TABLET | Freq: Three times a day (TID) | ORAL | Status: DC
  Administered 2015-03-25 – 2015-03-26 (×2): 50 mg via ORAL
  Filled 2015-03-25 (×4): qty 1

## 2015-03-25 MED ORDER — SENNOSIDES-DOCUSATE SODIUM 8.6-50 MG PO TABS
1.0000 | ORAL_TABLET | Freq: Every day | ORAL | Status: DC
  Administered 2015-03-25 – 2015-03-26 (×2): 1 via ORAL
  Filled 2015-03-25 (×2): qty 1

## 2015-03-25 MED ORDER — CARVEDILOL 25 MG PO TABS
25.0000 mg | ORAL_TABLET | Freq: Two times a day (BID) | ORAL | Status: DC
Start: 2015-03-25 — End: 2015-03-26
  Administered 2015-03-25 – 2015-03-26 (×2): 25 mg via ORAL
  Filled 2015-03-25 (×4): qty 1

## 2015-03-25 MED ORDER — POLYETHYLENE GLYCOL 3350 17 G PO PACK
17.0000 g | PACK | Freq: Every day | ORAL | Status: DC
  Administered 2015-03-26: 17 g via ORAL
  Filled 2015-03-25: qty 1

## 2015-03-25 MED ORDER — PROMETHAZINE HCL 25 MG PO TABS: 25.0000 mg | ORAL_TABLET | Freq: Four times a day (QID) | ORAL | Status: DC | PRN

## 2015-03-25 MED ORDER — METHOCARBAMOL 500 MG PO TABS
500.0000 mg | ORAL_TABLET | Freq: Three times a day (TID) | ORAL | Status: DC | PRN
  Administered 2015-03-26: 500 mg via ORAL
  Filled 2015-03-25: qty 1

## 2015-03-25 MED ORDER — ISOSORBIDE MONONITRATE ER 60 MG PO TB24
90.0000 mg | ORAL_TABLET | Freq: Every day | ORAL | Status: DC
  Administered 2015-03-26: 90 mg via ORAL
  Filled 2015-03-25: qty 1

## 2015-03-25 NOTE — ED Notes (Signed)
Bed: [facility]38 Expected date:  Expected time:  Means of arrival:  Comments: Triage 4  

## 2015-03-25 NOTE — ED Notes (Signed)
Pt stated "I'm here because my belly was hurting."  Questioned pt if she filled Rx's provided last evening.  Pt again stated "I don't have any money."  Questioned pt if she had not been taking her meds.  Pt denied.  When pt was questioned as to why she was here, she replied "my stomach"

## 2015-03-25 NOTE — ED Notes (Signed)
IVC: "RESPONDENT WITH HPB AND SCHIZOPHRENIA. [name] BELIEVES SHE IS NOT TAKING HER MEDS AND IS NOT UNDER DOCTORS CARE. SHE HAS BEEN WALKING AROUND IN THE MIDDLE OF THE NIGHT HAVING CONVERSATIONS AND LAUGHING WITH DEAD RELATIVES. SHE STATED SHE WAS GOING TO HER SISTER'S HOME TO KILL HER. SHE HAS CALLED 911 SEVERAL TIMES TO TAKE HER TO THE HOSPITAL WHEN SHE DOESN'T HAVE MEDICAL ISSUES. SHE TOLD THE DOCTOR SHE IS PREGNANT. SHE IS 65 YEARS OLD. SHE BOUGHT INFANT DIAPERS AND A DOLL IN PREPARATION FOR THE BABY."  Pt states she's having lower abdominal pain with painful urination. She says she wants to be checked to make sure she's not pregnant. She denies SI and HI. Compliant, calm, smiling.

## 2015-03-25 NOTE — BH Assessment (Signed)
[facility] Assessment Progress Note  Pt referred to [facility] and to [facility].  Decision pending.  Doylene Canning, MA Triage Specialist [phone]

## 2015-03-25 NOTE — BH Assessment (Addendum)
Assessment Note  Erin Riley is an 65 y.o. female who presents under IVC (by her son) to [facility]. IVC states that pt is schizophrenic and it is believed that she is not taking her meds or under doctors care. IVC continues that pt has been walking around in the middle of the night having conversations and laughing with dead relatives. IVC also reports that pt stated she was going to her sister's home to kill her. IVC also indicated that pt has "called 911 several times to take her to the hospital when she doesn't have medical issues. She told the doctor she is pregnant. She is 65 years old. She bought infant diapers and a doll in preparation for the baby".    Pt denied SI/HI/AVH. Pt denied having any psychiatric hx or being on any psychiatric meds. Pt indicated that she did want to be checked for pregnancy, but then said that she knew she wasn't pregnant. Counselor asked if it were possible for pt to be pregnant-if she still had her monthly cycle. Pt replied that she did still have her monthly cycle.   Counselor attempted to call petitioner, pt's son Erin Riley [phone]), but there was no answer nor voicemail.   Diagnosis: Schizophrenia  Past Medical History:  Past Medical History  Diagnosis Date  . Hypertension   . Left ventricular diastolic dysfunction, NYHA class 1 March 20    Severe concentric LVH (likely hypertensive), mild aortic sclerosis. Grade 1 diastolic dysfunction  . Coronary artery disease 04/2009    50% stenosis in the perforator of LAD; catheterization was for an abnormal Myoview in January 2000 showing anterior and inferolateral ischemia.  [name] PAD (peripheral artery disease) [facility]) March 2013    Lower extremity Dopplers: R. SFA 50-60%, R. PTA proximally occluded with distal reconstitution;; L. common iliac ~50%, L. SFA 50-70% stenosis, L. PTA < 50%  . Hyperlipidemia   . Chronic kidney disease (CKD), stage II (mild)     Class I-II  . History of schizophrenia     However I am not  sure about the validity of this. She is not on any medications.  . Chronic back pain   . 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 At least 3 subsequent echocardiograms have shown improved/normal EF with moderate to severe concentric LVH and diastolic dysfunction with LVOT/intracavitary gradient  . Heart murmur     Previously reported moderate to severe MR. Most recent echo in May 2014 revealed mild MR.  [name] Hypertensive hypertrophic cardiomyopathy: NYHA class II:  Echo: Severe concentric LVH with LV OT gradient; essentially preserved EF with diastolic dysfunction 02/15/2013    Not significantly symptomatic; echocardiogram from May 2014: Severe concentric LVH with EF 50-55%. Somewhat unbelievably grade 1 diastolic dysfunction with moderate LE dilation. Trivial MR noted.    Past Surgical History  Procedure Laterality Date  . Bunionectomy    . Nm myocar perf wall motion  03/2009    Persantine; EF 51%-both anterior and inferolateral ischemia  . Lower extremity doppler  05/29/2011    right SFA 50% to 59% diameter reduction,right posterior tibal atreery occlusive disease,reconstituting distally, left common illiac<50%,left SFA 50 to70%,left post. tibial <50%  . Doppler echocardiography  May 2014    EF 50-55%; severe concentric LVH; only grade 1 diastolic dysfunction. Mild aortic sclerosis - with LVOT /intracavitary gradient of roughly 20 mmHg mean. Mild to moderately dilated LA;;  previously reported MR not seen  . Doppler echocardiography  02/02/2009    mod to severe regurg mitral, EF 20 TO 25%, tricuspid severe regurg. ,pulmonic moderate regurg.  . Cardiac catheterization  04/09/2009    rt and lt cath -non obstructive coronary disease   . Carotid doppler  05/29/2011    left bulb/prox ICA moderate amtfibrous plaque with no evidence significant reduction.,right bulb /proximal  ICA normal patency    Family History:  Family History  Problem Relation Age of Onset  . Hypertension Mother     Social History:  reports that she quit smoking about 12 years ago. She has never used smokeless tobacco. She reports that she does not drink alcohol or use illicit drugs.  Additional Social History:  Alcohol / Drug Use Pain Medications: Pt denies Prescriptions: Pt denies Over the Counter: Pt denies History of alcohol / drug use?:  (unknown)  CIWA: CIWA-Ar BP: 156/93 mmHg Pulse Rate: 83 COWS:    Allergies: No Known Allergies  Home Medications:  (Not in a hospital admission)  OB/GYN Status:  Patient's last menstrual period was 05/10/2013.  General Assessment Data Location of Assessment: [facility] TTS Assessment: In system Is this a Tele or Face-to-Face Assessment?: Face-to-Face Is this an Initial Assessment or a Re-assessment for this encounter?: Initial Assessment Marital status: Single Is patient pregnant?: No Pregnancy Status: No Living Arrangements: Children Can pt return to current living arrangement?: Yes Admission Status: Involuntary Is patient capable of signing voluntary admission?: No Referral Source: Self/Family/Friend Insurance type: Medicare  Medical Screening Exam [facility]) Medical Exam completed: Yes  Crisis Care Plan Living Arrangements: Children Name of Psychiatrist: pt denies Name of Therapist: pt denies  Education Status Is patient currently in school?: No  Risk to self with the past 6 months Suicidal Ideation: No Has patient been a risk to self within the past 6 months prior to admission? : No Suicidal Intent: No Has patient had any suicidal intent within the past 6 months prior to admission? : No Is patient at risk for suicide?: No Suicidal Plan?: No Has patient had any suicidal plan within the past 6 months prior to admission? : No Access to Means: No What has been your use of drugs/alcohol within the last 12 months?:  pt denies Previous Attempts/Gestures: No Intentional Self Injurious Behavior: None Family Suicide History: Unknown Persecutory voices/beliefs?: No Depression: No Substance abuse history and/or treatment for substance abuse?: No Suicide prevention information given to non-admitted patients: Not applicable  Risk to Others within the past 6 months Homicidal Ideation: No Does patient have any lifetime risk of violence toward others beyond the six months prior to admission? : Unknown Current Homicidal Intent: No Current Homicidal Plan: No Access to Homicidal Means: No History of harm to others?: No Assessment of Violence: None Noted Does patient have access to weapons?: No Criminal Charges Pending?:  (unknown) Does patient have a court date:  (unknown) Is patient on probation?: Unknown  Psychosis Hallucinations: None noted Delusions: Unspecified (pt believes she is pregnant)  Mental Status Report Appearance/Hygiene: Unremarkable Eye Contact: Poor Motor Activity: Unremarkable Speech: Pressured Level of Consciousness: Alert Mood: Other (Comment) (unremarkable) Affect: Flat Anxiety Level: Minimal Thought Processes: Coherent, Irrelevant Judgement: Unable to Assess Orientation: Unable to assess Obsessive Compulsive Thoughts/Behaviors: None  Cognitive Functioning Concentration: Poor Memory: Unable to Assess IQ: Average Insight: Poor Impulse Control: Unable to Assess Appetite: Good Sleep: No Change Vegetative Symptoms: None  ADLScreening [facility] Assessment Services) Patient's cognitive ability adequate to safely complete  daily activities?: Yes Patient able to express need for assistance with ADLs?: Yes Independently performs ADLs?: Yes (appropriate for developmental age)  Prior Inpatient Therapy Prior Inpatient Therapy: No  Prior Outpatient Therapy Prior Outpatient Therapy: No Does patient have an ACCT team?: Unknown Does patient have Intensive In-House Services?  :  No Does patient have [facility] services? : Unknown Does patient have P4CC services?: No  ADL Screening (condition at time of admission) Patient's cognitive ability adequate to safely complete daily activities?: Yes Is the patient deaf or have difficulty hearing?: No Does the patient have difficulty seeing, even when wearing glasses/contacts?: No Does the patient have difficulty concentrating, remembering, or making decisions?: No Patient able to express need for assistance with ADLs?: Yes Does the patient have difficulty dressing or bathing?: No Independently performs ADLs?: Yes (appropriate for developmental age) Does the patient have difficulty walking or climbing stairs?: No Weakness of Legs: None Weakness of Arms/Hands: None  Home Assistive Devices/Equipment Home Assistive Devices/Equipment: None  Therapy Consults (therapy consults require a physician order) PT Evaluation Needed: No OT Evalulation Needed: No SLP Evaluation Needed: No Abuse/Neglect Assessment (Assessment to be complete while patient is alone) Physical Abuse: Denies Verbal Abuse: Denies Sexual Abuse: Denies Exploitation of patient/patient's resources: Denies Self-Neglect: Denies Values / Beliefs Cultural Requests During Hospitalization: None Spiritual Requests During Hospitalization: None Consults Spiritual Care Consult Needed: No Social Work Consult Needed: No      Additional Information 1:1 In Past 12 Months?: No CIRT Risk: No Elopement Risk: No Does patient have medical clearance?: Yes     Disposition:  Disposition Initial Assessment Completed for this Encounter: Yes Disposition of Patient: Inpatient treatment program (per Dahlia Byes, NP) Type of inpatient treatment program: Adult (TTS to seek placement)  On Site Evaluation by:   Reviewed with Physician:    Laddie Aquas 03/25/2015 2:11 PM

## 2015-03-25 NOTE — ED Provider Notes (Addendum)
CSN: [id number]     Arrival date & time 03/25/15  1122 History   First MD Initiated Contact with Patient 03/25/15 1243     Chief Complaint  Patient presents with  . IVC   . Medical Clearance     (Consider location/radiation/quality/duration/timing/severity/associated sxs/prior Treatment) HPI   Erin Riley is a 65 y.o. female with PMH significant for HTN, CAD, PAD, CKD stage II, chronic back pain who presents with IVC and medical clearance.  Per IVC papers, pt with hx of schizophrenia and medicinal noncompliance.  Patient has been walking around in the middle of the night having conversations and laughing with dead relatives.  She has also stated that she was going to her sister's house to kill her.  She presents complaining of lower abdominal pain, and states that she wants to make sure she is not pregnant.  Currently, she denies SI or HI.  She has been seen twice in the past 2 days for abdominal pain and rectal bleeding. Negative work up including CT 03/23/15.   Past Medical History  Diagnosis Date  . Hypertension   . Left ventricular diastolic dysfunction, NYHA class 1 March 20    Severe concentric LVH (likely hypertensive), mild aortic sclerosis. Grade 1 diastolic dysfunction  . Coronary artery disease 04/2009    50% stenosis in the perforator of LAD; catheterization was for an abnormal Myoview in January 2000 showing anterior and inferolateral ischemia.  [name] PAD (peripheral artery disease) [facility]) March 2013    Lower extremity Dopplers: R. SFA 50-60%, R. PTA proximally occluded with distal reconstitution;; L. common iliac ~50%, L. SFA 50-70% stenosis, L. PTA < 50%  . Hyperlipidemia   . Chronic kidney disease (CKD), stage II (mild)     Class I-II  . History of schizophrenia     However I am not sure about the validity of this. She is not on any medications.  . Chronic back pain   . 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 At least 3 subsequent echocardiograms have shown improved/normal EF with moderate to severe concentric LVH and diastolic dysfunction with LVOT/intracavitary gradient  . Heart murmur     Previously reported moderate to severe MR. Most recent echo in May 2014 revealed mild MR.  [name] Hypertensive hypertrophic cardiomyopathy: NYHA class II:  Echo: Severe concentric LVH with LV OT gradient; essentially preserved EF with diastolic dysfunction 02/15/2013    Not significantly symptomatic; echocardiogram from May 2014: Severe concentric LVH with EF 50-55%. Somewhat unbelievably grade 1 diastolic dysfunction with moderate LE dilation. Trivial MR noted.   Past Surgical History  Procedure Laterality Date  . Bunionectomy    . Nm myocar perf wall motion  03/2009    Persantine; EF 51%-both anterior and inferolateral ischemia  . Lower extremity doppler  05/29/2011    right SFA 50% to 59% diameter reduction,right posterior tibal atreery occlusive disease,reconstituting distally, left common illiac<50%,left SFA 50 to70%,left post. tibial <50%  . Doppler echocardiography  May 2014    EF 50-55%; severe concentric LVH; only grade 1 diastolic dysfunction. Mild aortic sclerosis - with LVOT /intracavitary gradient of roughly 20 mmHg mean. Mild to moderately dilated LA;; previously reported MR not seen  . Doppler echocardiography  02/02/2009    mod to severe regurg mitral, EF 20 TO 25%, tricuspid severe regurg. ,pulmonic moderate regurg.  . Cardiac catheterization  04/09/2009  rt and lt cath -non obstructive coronary disease   . Carotid doppler  05/29/2011    left bulb/prox ICA moderate amtfibrous plaque with no evidence significant reduction.,right bulb /proximal ICA normal patency   Family History  Problem Relation Age of Onset  . Hypertension Mother    Social History  Substance Use Topics  . Smoking status: Former Smoker    Quit date: 05/14/2002  .  Smokeless tobacco: Never Used  . Alcohol Use: No   OB History    Gravida Para Term Preterm AB TAB SAB Ectopic Multiple Living   Review of Systems All other systems negative unless otherwise stated in HPI    Allergies  Review of patient's allergies indicates no known allergies.  Home Medications   Prior to Admission medications   Medication Sig Start Date End Date Taking? Authorizing Provider  amLODipine (NORVASC) 10 MG tablet Take 10 mg by mouth daily.   Yes Historical Provider, MD  aspirin EC 81 MG tablet Take 81 mg by mouth daily.   Yes Historical Provider, MD  atorvastatin (LIPITOR) 40 MG tablet Take 1 tablet (40 mg total) by mouth daily. 06/08/14  Yes Marykay Lex, MD  carvedilol (COREG) 25 MG tablet Take 1 tablet (25 mg total) by mouth 2 (two) times daily with a meal. 03/13/14  Yes Marykay Lex, MD  digoxin (LANOXIN) 0.125 MG tablet Take 0.125 mg by mouth daily.   Yes Historical Provider, MD  gabapentin (NEURONTIN) 300 MG capsule Take 1 capsule (300 mg total) by mouth 2 (two) times daily. 02/02/15  Yes Stevi Barrett, PA-C  hydrALAZINE (APRESOLINE) 50 MG tablet take 1 tablet by mouth three times a day Patient taking differently: Take 50 mg by mouth 3 (three) times daily.  03/13/14  Yes Marykay Lex, MD  isosorbide mononitrate (IMDUR) 60 MG 24 hr tablet Take 90 mg by mouth daily.   Yes Historical Provider, MD  methocarbamol (ROBAXIN) 500 MG tablet Take 1 tablet (500 mg total) by mouth 3 (three) times daily as needed for muscle spasms (back spasm). 09/28/14  Yes Tia Alert, MD  omeprazole (PRILOSEC) 20 MG capsule Take 20 mg by mouth daily.  09/22/13  Yes Historical Provider, MD  polyethylene glycol (MIRALAX / GLYCOLAX) packet Take 17 g by mouth daily. 03/23/15  Yes Tatyana Kirichenko, PA-C  promethazine (PHENERGAN) 25 MG tablet Take 25 mg by mouth 4 (four) times daily as needed for nausea or vomiting.  03/18/15  Yes Historical Provider, MD  senna-docusate  (SENOKOT-S) 8.6-50 MG tablet Take 1 tablet by mouth daily. 03/24/15  Yes Nicole Pisciotta, PA-C  spironolactone (ALDACTONE) 25 MG tablet Take 12.5 mg by mouth daily.   Yes Historical Provider, MD  sucralfate (CARAFATE) 1 G tablet Take 1 g by mouth 4 (four) times daily. 09/25/13  Yes Rolland Porter, MD  Witch Hazel (TUCKS) 50 % PADS As directed 03/24/15  Yes Nicole Pisciotta, PA-C   BP 156/93 mmHg  Pulse 83  Temp(Src) 97.6 F (36.4 C) (Oral)  Resp 18  SpO2 100%  LMP 05/10/2013 Physical Exam  Constitutional: She is oriented to person, place, and time. She appears well-developed and well-nourished.  Patient calmly sitting in exam chair watching TV.  HENT:  Head: Normocephalic and atraumatic.  Mouth/Throat: Oropharynx is clear and moist.  Eyes: Conjunctivae are normal. Pupils are equal, round, and reactive to light.  Neck: Normal range of motion. Neck supple.  Cardiovascular: Normal rate, regular rhythm and normal heart sounds.   No murmur heard. Pulmonary/Chest: Effort normal and breath sounds normal. No accessory muscle usage or stridor. No respiratory distress. She has no wheezes. She has no rhonchi. She has no rales.  Abdominal: Soft. Bowel sounds are normal. She exhibits no distension. There is no tenderness. There is no rigidity, no rebound and no guarding.  Musculoskeletal: Normal range of motion.  Lymphadenopathy:    She has no cervical adenopathy.  Neurological: She is alert and oriented to person, place, and time.  Speech clear without dysarthria.  Skin: Skin is warm and dry.  Psychiatric: Her speech is normal and behavior is normal. Her affect is labile. She expresses homicidal (reported) ideation. She expresses no suicidal ideation. She expresses homicidal plans (reported).    ED Course  Procedures (including critical care time) Labs Review Labs Reviewed  COMPREHENSIVE METABOLIC PANEL - Abnormal; Notable for the following:    Glucose, Bld 112 (*)    BUN 21 (*)    Creatinine,  Ser 1.11 (*)    GFR calc non Af Amer 52 (*)    GFR calc Af Amer 60 (*)    All other components within normal limits  ACETAMINOPHEN LEVEL - Abnormal; Notable for the following:    Acetaminophen (Tylenol), Serum <10 (*)    All other components within normal limits  ETHANOL  SALICYLATE LEVEL  CBC  URINE RAPID DRUG SCREEN, HOSP PERFORMED  URINALYSIS, ROUTINE W REFLEX MICROSCOPIC (NOT AT [facility])    Imaging Review Ct Abdomen Pelvis W Contrast  03/23/2015  CLINICAL DATA:  Hematochezia. EXAM: CT ABDOMEN AND PELVIS WITH CONTRAST TECHNIQUE: Multidetector CT imaging of the abdomen and pelvis was performed using the standard protocol following bolus administration of intravenous contrast. CONTRAST:  OMNIPAQUE IOHEXOL 300 MG/ML  SOLN COMPARISON:  December 28, 2014 and April 23, 2010 FINDINGS: Lower chest: There is a small area of airspace consolidation in the medial aspect of the posterior segment of the right lower lobe. Lung bases otherwise appear clear. Hepatobiliary: No focal liver lesions are identified. Gallbladder wall is not appreciably thickened. There is no biliary duct dilatation. Pancreas: There is no pancreatic mass or inflammatory focus. Spleen: No splenic lesions are identified. There are small splenules inferior to the spleen. Adrenals/Urinary Tract: Adrenals appear unremarkable bilaterally. There is a cyst arising from the lower pole of the left kidney measuring 1.6 x 1.0 cm. No hydronephrosis on either side. There is no renal or ureteral calculus on either side. The urinary bladder is midline with wall thickness within normal limits. Several small phleboliths are noted in the pelvis. Stomach/Bowel: There are multiple sigmoid diverticula without diverticulitis. There is no bowel wall or mesenteric thickening. No bowel obstruction. No free air or portal venous air. Vascular/Lymphatic: There is atherosclerotic calcification in the aorta and iliac arteries. No aneurysm is apparent. No focal  vascular lesions are identified apart from the calcification in the aorta and iliac arteries. There is no demonstrable adenopathy in the abdomen or pelvis. Reproductive: Uterus is anteverted. There is a persistent mass with cystic attenuation in the right adnexal region measuring 5.1 x 3.1 cm, stable. No other pelvic masses seen. No pelvic fluid collections are appreciable. Other: The appendix appears normal. There is a small ventral hernia containing only fat. No abscess or ascites is seen in the abdomen or pelvis. Musculoskeletal: The patient has had posterior screw and plate fixation at L4, L5, and S1 bilaterally. There are no blastic or lytic bone  lesions. There is no intramuscular or abdominal wall lesions. 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 Question simple ovarian cyst versus ovarian cystadenoma. Sigmoid diverticula without diverticulitis. No bowel obstruction. No abscess. Appendix appears normal. Small area of infiltrate medial right base. Small ventral hernia containing only fat. Postoperative change in lumbar spine. Electronically Signed   By: Bretta Bang III M.D.   On: 03/23/2015 19:28   Dg Abd 2 Views  03/23/2015  CLINICAL DATA:  Rectal bleeding ; constipation EXAM: ABDOMEN - 2 VIEW COMPARISON:  CT abdomen and pelvis December 28, 2014 FINDINGS: Supine and upright images were obtained. There is moderate stool throughout the colon. There is no bowel dilatation or air-fluid level suggesting obstruction. No free air. There are vascular calcifications in the pelvis. There is postoperative change in the lower lumbar spine. Lung bases are clear. IMPRESSION: Moderate stool in colon. No obstruction or free air. Lung bases clear. Electronically Signed   By: Bretta Bang III M.D.   On: 03/23/2015 17:31   I have personally reviewed and evaluated these images and lab results as part of my medical decision-making.   EKG Interpretation None      MDM   Final diagnoses:   Homicidal ideation    Pt presents to the ED for medical clearance for IVC. Hx of schizophrenia possible med noncompliance.  The patient complains of lower abdominal pain and has been seen in the ED the past 2 days with negative CT scan and negative work up.  Patient given rx for hemorrhoids.  She is in no acute distress. The patients demeanor is calm and cooperative.  Labs unremarkable. UA negative.  Per TTS, patient meets inpatient criteria, will place in psych hold. Case has been discussed with Dr. Jeraldine Loots who agrees with the above plan.      Cheri Fowler, PA-C 03/25/15 1510  Gerhard Munch, MD 03/25/15 1521  Cheri Fowler, PA-C 03/25/15 1523  Gerhard Munch, MD 03/25/15 [phone]

## 2015-03-26 DIAGNOSIS — F209 Schizophrenia, unspecified: Secondary | ICD-10-CM | POA: Diagnosis not present

## 2015-03-26 MED ORDER — ALUM & MAG HYDROXIDE-SIMETH 200-200-20 MG/5ML PO SUSP
30.0000 mL | ORAL | Status: DC | PRN
  Administered 2015-03-26: 30 mL via ORAL
  Filled 2015-03-26: qty 30

## 2015-03-26 NOTE — ED Notes (Signed)
Med list printed and faxed to [phone] per Stephanie's request with [facility] facility.  Report received and reflects successfully sent.

## 2015-03-26 NOTE — ED Notes (Signed)
Patient ambulatory to restroom.  Sitter provided snack at the patients request.

## 2015-03-26 NOTE — BH Assessment (Signed)
[facility] Assessment Progress Note  At 12:28 Darren calls from [facility] in [place].  Pt has been accepted to their facility by Dr. Delene Loll.  Please call report to [phone]; ask for the adult unit.  Legrand Como, NP concurs with this decision.  Pt's nurse has been notified, and she agrees to call report.  Pt is under IVC and is to be transported via [facility].  Doylene Canning, MA Triage Specialist [phone]

## 2015-03-26 NOTE — ED Notes (Signed)
Pt c/o 'heartburn".  Dr. Clarene Duke informed.

## 2015-03-26 NOTE — ED Notes (Signed)
Received call from [place], RN Admission, [facility]).  Will take patient today.  Requesting med list be faxed.  # provided for fax [phone].

## 2015-03-26 NOTE — ED Notes (Signed)
rn checked with cafeteria, they do not have danish rolls

## 2015-03-26 NOTE — ED Notes (Addendum)
Sheriff department at bedside. 1 pt belonging bag given to sheriff

## 2015-03-26 NOTE — ED Notes (Signed)
Pt given extra orange juice, pt requesting danish rolls, unsure if cafeteria has those, pt okay with graham crackers.

## 2015-03-26 NOTE — ED Notes (Signed)
Sheriff will be here in 15-20 min. Pt told and encouraged to use the bathroom. Pt given her tennis shoes without laces to wear.

## 2015-04-05 ENCOUNTER — Ambulatory Visit: Payer: Medicare Other | Admitting: Cardiology

## 2015-04-08 ENCOUNTER — Encounter: Payer: Self-pay | Admitting: *Deleted

## 2015-04-09 ENCOUNTER — Emergency Department (HOSPITAL_COMMUNITY)
Admission: EM | Admit: 2015-04-09 | Discharge: 2015-04-09 | Disposition: A | Discharge status: Home / Self Care | Payer: Medicare Other | Attending: Emergency Medicine | Admitting: Emergency Medicine

## 2015-04-09 ENCOUNTER — Encounter (HOSPITAL_COMMUNITY): Payer: Self-pay | Admitting: Emergency Medicine

## 2015-04-09 DIAGNOSIS — G8929 Other chronic pain: Secondary | ICD-10-CM | POA: Insufficient documentation

## 2015-04-09 DIAGNOSIS — I129 Hypertensive chronic kidney disease with stage 1 through stage 4 chronic kidney disease, or unspecified chronic kidney disease: Secondary | ICD-10-CM | POA: Diagnosis not present

## 2015-04-09 DIAGNOSIS — R011 Cardiac murmur, unspecified: Secondary | ICD-10-CM | POA: Insufficient documentation

## 2015-04-09 DIAGNOSIS — I2510 Atherosclerotic heart disease of native coronary artery without angina pectoris: Secondary | ICD-10-CM | POA: Diagnosis not present

## 2015-04-09 DIAGNOSIS — Z87891 Personal history of nicotine dependence: Secondary | ICD-10-CM | POA: Insufficient documentation

## 2015-04-09 DIAGNOSIS — R109 Unspecified abdominal pain: Secondary | ICD-10-CM | POA: Diagnosis present

## 2015-04-09 DIAGNOSIS — Z7982 Long term (current) use of aspirin: Secondary | ICD-10-CM | POA: Insufficient documentation

## 2015-04-09 DIAGNOSIS — Z79899 Other long term (current) drug therapy: Secondary | ICD-10-CM | POA: Diagnosis not present

## 2015-04-09 DIAGNOSIS — F209 Schizophrenia, unspecified: Secondary | ICD-10-CM | POA: Insufficient documentation

## 2015-04-09 DIAGNOSIS — R1084 Generalized abdominal pain: Secondary | ICD-10-CM | POA: Diagnosis not present

## 2015-04-09 DIAGNOSIS — E785 Hyperlipidemia, unspecified: Secondary | ICD-10-CM | POA: Insufficient documentation

## 2015-04-09 DIAGNOSIS — N182 Chronic kidney disease, stage 2 (mild): Secondary | ICD-10-CM | POA: Diagnosis not present

## 2015-04-09 LAB — CBC WITH DIFFERENTIAL/PLATELET
Basophils Absolute: 0 10*3/uL (ref 0.0–0.1)
Basophils Relative: 0 %
Eosinophils Absolute: 0.2 10*3/uL (ref 0.0–0.7)
Eosinophils Relative: 2 %
HEMATOCRIT: 34.8 % — AB (ref 36.0–46.0)
HEMOGLOBIN: 11.5 g/dL — AB (ref 12.0–15.0)
LYMPHS ABS: 2.4 10*3/uL (ref 0.7–4.0)
LYMPHS PCT: 30 %
MCH: 30.5 pg (ref 26.0–34.0)
MCHC: 33 g/dL (ref 30.0–36.0)
MCV: 92.3 fL (ref 78.0–100.0)
Monocytes Absolute: 0.6 10*3/uL (ref 0.1–1.0)
Monocytes Relative: 7 %
NEUTROS ABS: 4.7 10*3/uL (ref 1.7–7.7)
NEUTROS PCT: 61 %
Platelets: 312 10*3/uL (ref 150–400)
RBC: 3.77 MIL/uL — AB (ref 3.87–5.11)
RDW: 13.6 % (ref 11.5–15.5)
WBC: 7.9 10*3/uL (ref 4.0–10.5)

## 2015-04-09 LAB — URINALYSIS, ROUTINE W REFLEX MICROSCOPIC
BILIRUBIN URINE: NEGATIVE
Glucose, UA: NEGATIVE mg/dL
HGB URINE DIPSTICK: NEGATIVE
KETONES UR: NEGATIVE mg/dL
Leukocytes, UA: NEGATIVE
Nitrite: NEGATIVE
Protein, ur: NEGATIVE mg/dL
SPECIFIC GRAVITY, URINE: 1.019 (ref 1.005–1.030)
pH: 7.5 (ref 5.0–8.0)

## 2015-04-09 LAB — COMPREHENSIVE METABOLIC PANEL
ALK PHOS: 76 U/L (ref 38–126)
ALT: 19 U/L (ref 14–54)
ANION GAP: 10 (ref 5–15)
AST: 22 U/L (ref 15–41)
Albumin: 3.5 g/dL (ref 3.5–5.0)
BILIRUBIN TOTAL: 0.1 mg/dL — AB (ref 0.3–1.2)
BUN: 24 mg/dL — ABNORMAL HIGH (ref 6–20)
CALCIUM: 9.4 mg/dL (ref 8.9–10.3)
CO2: 28 mmol/L (ref 22–32)
CREATININE: 1.61 mg/dL — AB (ref 0.44–1.00)
Chloride: 101 mmol/L (ref 101–111)
GFR calc non Af Amer: 33 mL/min — ABNORMAL LOW (ref >60)
GFR, EST AFRICAN AMERICAN: 38 mL/min — AB (ref >60)
Glucose, Bld: 138 mg/dL — ABNORMAL HIGH (ref 65–99)
Potassium: 4.3 mmol/L (ref 3.5–5.1)
SODIUM: 139 mmol/L (ref 135–145)
TOTAL PROTEIN: 7.1 g/dL (ref 6.5–8.1)

## 2015-04-09 LAB — LIPASE, BLOOD: Lipase: 33 U/L (ref 11–51)

## 2015-04-09 MED ORDER — MORPHINE SULFATE (PF) 4 MG/ML IV SOLN
6.0000 mg | Freq: Once | INTRAVENOUS | Status: AC
  Administered 2015-04-09: 6 mg via INTRAVENOUS
  Filled 2015-04-09: qty 2

## 2015-04-09 NOTE — ED Provider Notes (Signed)
CSN: [id number]     Arrival date & time 04/09/15  0500 History   First MD Initiated Contact with Patient 04/09/15 [phone]     Chief Complaint  Patient presents with  . Abdominal Pain     (Consider location/radiation/quality/duration/timing/severity/associated sxs/prior Treatment) HPI  Erin Riley is a 65 y.o. female with no sig PMH, here with abdominal pain.  Patient states it has been going on for several days.  She has not taken any medication to make it better.  It is diffuse and feels sharp without radiation.  She has no N/V/D for the past 2 days.  She denies urinary or vaginal symptoms.  She has had no fevers or recent infections.  Patient has no further complaints.    10 Systems reviewed and are negative for acute change except as noted in the HPI.   Past Medical History  Diagnosis Date  . Hypertension   . Left ventricular diastolic dysfunction, NYHA class 1 March 20    Severe concentric LVH (likely hypertensive), mild aortic sclerosis. Grade 1 diastolic dysfunction  . Coronary artery disease 04/2009    50% stenosis in the perforator of LAD; catheterization was for an abnormal Myoview in January 2000 showing anterior and inferolateral ischemia.  [name] PAD (peripheral artery disease) [facility]) March 2013    Lower extremity Dopplers: R. SFA 50-60%, R. PTA proximally occluded with distal reconstitution;; L. common iliac ~50%, L. SFA 50-70% stenosis, L. PTA < 50%  . Hyperlipidemia   . Chronic kidney disease (CKD), stage II (mild)     Class I-II  . History of schizophrenia     However I am not sure about the validity of this. She is not on any medications.  . Chronic back pain   . 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 At least 3 subsequent echocardiograms have shown improved/normal EF with moderate to severe concentric LVH and diastolic dysfunction with  LVOT/intracavitary gradient  . Heart murmur     Previously reported moderate to severe MR. Most recent echo in May 2014 revealed mild MR.  [name] Hypertensive hypertrophic cardiomyopathy: NYHA class II:  Echo: Severe concentric LVH with LV OT gradient; essentially preserved EF with diastolic dysfunction 02/15/2013    Not significantly symptomatic; echocardiogram from May 2014: Severe concentric LVH with EF 50-55%. Somewhat unbelievably grade 1 diastolic dysfunction with moderate LE dilation. Trivial MR noted.   Past Surgical History  Procedure Laterality Date  . Bunionectomy    . Nm myocar perf wall motion  03/2009    Persantine; EF 51%-both anterior and inferolateral ischemia  . Lower extremity doppler  05/29/2011    right SFA 50% to 59% diameter reduction,right posterior tibal atreery occlusive disease,reconstituting distally, left common illiac<50%,left SFA 50 to70%,left post. tibial <50%  . Doppler echocardiography  May 2014    EF 50-55%; severe concentric LVH; only grade 1 diastolic dysfunction. Mild aortic sclerosis - with LVOT /intracavitary gradient of roughly 20 mmHg mean. Mild to moderately dilated LA;; previously reported MR not seen  . Doppler echocardiography  02/02/2009    mod to severe regurg mitral, EF 20 TO 25%, tricuspid severe regurg. ,pulmonic moderate regurg.  . Cardiac catheterization  04/09/2009    rt and lt cath -non obstructive coronary disease   . Carotid doppler  05/29/2011    left bulb/prox ICA moderate amtfibrous plaque with no evidence significant reduction.,right bulb /proximal ICA normal  patency   Family History  Problem Relation Age of Onset  . Hypertension Mother    Social History  Substance Use Topics  . Smoking status: Former Smoker    Quit date: 05/14/2002  . Smokeless tobacco: Never Used  . Alcohol Use: No   OB History    Gravida Para Term Preterm AB TAB SAB Ectopic Multiple Living   Review of Systems    Allergies  Review  of patient's allergies indicates no known allergies.  Home Medications   Prior to Admission medications   Medication Sig Start Date End Date Taking? Authorizing Provider  amLODipine (NORVASC) 10 MG tablet Take 10 mg by mouth daily.    Historical Provider, MD  aspirin EC 81 MG tablet Take 81 mg by mouth daily.    Historical Provider, MD  atorvastatin (LIPITOR) 40 MG tablet Take 1 tablet (40 mg total) by mouth daily. 06/08/14   Marykay Lex, MD  carvedilol (COREG) 25 MG tablet Take 1 tablet (25 mg total) by mouth 2 (two) times daily with a meal. 03/13/14   Marykay Lex, MD  digoxin (LANOXIN) 0.125 MG tablet Take 0.125 mg by mouth daily.    Historical Provider, MD  gabapentin (NEURONTIN) 300 MG capsule Take 1 capsule (300 mg total) by mouth 2 (two) times daily. 02/02/15   Stevi Barrett, PA-C  hydrALAZINE (APRESOLINE) 50 MG tablet take 1 tablet by mouth three times a day Patient taking differently: Take 50 mg by mouth 3 (three) times daily.  03/13/14   Marykay Lex, MD  isosorbide mononitrate (IMDUR) 60 MG 24 hr tablet Take 90 mg by mouth daily.    Historical Provider, MD  methocarbamol (ROBAXIN) 500 MG tablet Take 1 tablet (500 mg total) by mouth 3 (three) times daily as needed for muscle spasms (back spasm). 09/28/14   Tia Alert, MD  omeprazole (PRILOSEC) 20 MG capsule Take 20 mg by mouth daily.  09/22/13   Historical Provider, MD  polyethylene glycol (MIRALAX / GLYCOLAX) packet Take 17 g by mouth daily. 03/23/15   Tatyana Kirichenko, PA-C  promethazine (PHENERGAN) 25 MG tablet Take 25 mg by mouth 4 (four) times daily as needed for nausea or vomiting.  03/18/15   Historical Provider, MD  senna-docusate (SENOKOT-S) 8.6-50 MG tablet Take 1 tablet by mouth daily. 03/24/15   Nicole Pisciotta, PA-C  spironolactone (ALDACTONE) 25 MG tablet Take 12.5 mg by mouth daily.    Historical Provider, MD  sucralfate (CARAFATE) 1 G tablet Take 1 g by mouth 4 (four) times daily. 09/25/13   Rolland Porter, MD  Witch  Hazel (TUCKS) 50 % PADS As directed 03/24/15   Joni Reining Pisciotta, PA-C   BP 135/72 mmHg  Pulse 85  Temp(Src) 97.8 F (36.6 C) (Oral)  Resp 32  SpO2 95%  LMP 05/10/2013 Physical Exam  Constitutional: She is oriented to person, place, and time. She appears well-developed and well-nourished. No distress.  HENT:  Head: Normocephalic and atraumatic.  Nose: Nose normal.  Mouth/Throat: Oropharynx is clear and moist. No oropharyngeal exudate.  Eyes: Conjunctivae and EOM are normal. Pupils are equal, round, and reactive to light. No scleral icterus.  Neck: Normal range of motion. Neck supple. No JVD present. No tracheal deviation present. No thyromegaly present.  Cardiovascular: Normal rate, regular rhythm and normal heart sounds.  Exam reveals no gallop and no friction rub.   No murmur heard. Pulmonary/Chest: Effort normal and  breath sounds normal. No respiratory distress. She has no wheezes. She exhibits no tenderness.  Abdominal: Soft. Bowel sounds are normal. She exhibits no distension and no mass. There is no tenderness. There is no rebound and no guarding.  Musculoskeletal: Normal range of motion. She exhibits no edema or tenderness.  Lymphadenopathy:    She has no cervical adenopathy.  Neurological: She is alert and oriented to person, place, and time. No cranial nerve deficit. She exhibits normal muscle tone.  Skin: Skin is warm and dry. No rash noted. No erythema. No pallor.  Nursing note and vitals reviewed.   ED Course  Procedures (including critical care time) Labs Review Labs Reviewed  CBC WITH DIFFERENTIAL/PLATELET - Abnormal; Notable for the following:    RBC 3.77 (*)    Hemoglobin 11.5 (*)    HCT 34.8 (*)    All other components within normal limits  COMPREHENSIVE METABOLIC PANEL - Abnormal; Notable for the following:    Glucose, Bld 138 (*)    BUN 24 (*)    Creatinine, Ser 1.61 (*)    Total Bilirubin 0.1 (*)    GFR calc non Af Amer 33 (*)    GFR calc Af Amer 38 (*)     All other components within normal limits  URINALYSIS, ROUTINE W REFLEX MICROSCOPIC (NOT AT [facility]) - Abnormal; Notable for the following:    APPearance HAZY (*)    All other components within normal limits  LIPASE, BLOOD    Imaging Review No results found. I have personally reviewed and evaluated these images and lab results as part of my medical decision-making.   EKG Interpretation   Date/Time:  Friday April 09 2015 05:22:52 EST Ventricular Rate:  78 PR Interval:  177 QRS Duration: 92 QT Interval:  363 QTC Calculation: 413 R Axis:   27 Text Interpretation:  Sinus rhythm Premature ventricular complexes  Probable left atrial enlargement Left ventricular hypertrophy Nonspecific  T abnormalities, lateral leads No significant change since last tracing  Confirmed by Erroll Luna [phone]) on 04/09/2015 5:41:33 AM      MDM   Final diagnoses:  None    Patient presents to the ED for abdominal pain. She has no significant tenderness on exam.  Will obtains labs, give IVF and morphine.  Upon repeat evaluation, the patient's pain has improved.  Labs are unremarkable other than Cr of 1.61.  Patient was informed of lab value, need to drink plenty of fluids and see a primary care doctor for repeat check within 3 days.  She demonstrates good understanding.  Abdominal exam is still benign.  She appears well and in NAD.  VS remain within her normal limits and she is safe for DC.    [name], MD 04/09/15 [phone]

## 2015-04-09 NOTE — ED Notes (Signed)
Pt verbalized understanding of d/c instructions and has no further questions. Pt stable and NAD. Pt d/c home with family driving.  

## 2015-04-09 NOTE — ED Notes (Signed)
Pt presents from home for abd pain that is described as sharp and extends across entire upper abd; pt denies n/v but reports diarrhea;

## 2015-04-09 NOTE — Discharge Instructions (Signed)
Abdominal Pain, Adult Erin Riley, your blood work today shows mild dehydration.  Drink plenty of water and see your primary care doctor within 3 days to have your kidney function checked again.  If any symptoms worsen, come back to the ED immediately. Thank you. Many things can cause belly (abdominal) pain. Most times, the belly pain is not dangerous. Many cases of belly pain can be watched and treated at home. HOME CARE   Do not take medicines that help you go poop (laxatives) unless told to by your doctor.  Only take medicine as told by your doctor.  Eat or drink as told by your doctor. Your doctor will tell you if you should be on a special diet. GET HELP IF:  You do not know what is causing your belly pain.  You have belly pain while you are sick to your stomach (nauseous) or have runny poop (diarrhea).  You have pain while you pee or poop.  Your belly pain wakes you up at night.  You have belly pain that gets worse or better when you eat.  You have belly pain that gets worse when you eat fatty foods.  You have a fever. GET HELP RIGHT AWAY IF:   The pain does not go away within 2 hours.  You keep throwing up (vomiting).  The pain changes and is only in the right or left part of the belly.  You have bloody or tarry looking poop. MAKE SURE YOU:   Understand these instructions.  Will watch your condition.  Will get help right away if you are not doing well or get worse.   This information is not intended to replace advice given to you by your health care provider. Make sure you discuss any questions you have with your health care provider.   Document Released: 08/09/2007 Document Revised: 03/13/2014 Document Reviewed: 10/30/2012 Elsevier Interactive Patient Education [employer]

## 2015-04-12 ENCOUNTER — Other Ambulatory Visit: Payer: Self-pay

## 2015-04-12 MED ORDER — ISOSORBIDE MONONITRATE ER 60 MG PO TB24: 90.0000 mg | ORAL_TABLET | Freq: Every day | ORAL | Status: DC

## 2015-04-12 MED ORDER — SPIRONOLACTONE 25 MG PO TABS: 12.5000 mg | ORAL_TABLET | Freq: Every day | ORAL | Status: DC

## 2015-04-12 MED ORDER — DIGOXIN 125 MCG PO TABS: 0.1250 mg | ORAL_TABLET | Freq: Every day | ORAL | Status: DC

## 2015-04-12 NOTE — Telephone Encounter (Signed)
Rx(s) sent to pharmacy electronically.  

## 2015-04-26 ENCOUNTER — Other Ambulatory Visit: Payer: Self-pay | Admitting: Cardiology

## 2015-04-26 MED ORDER — HYDRALAZINE HCL 50 MG PO TABS: ORAL_TABLET | ORAL | Status: DC

## 2015-04-26 NOTE — Telephone Encounter (Signed)
Rx(s) sent to pharmacy electronically.  

## 2015-04-27 ENCOUNTER — Telehealth: Payer: Self-pay | Admitting: Cardiology

## 2015-04-27 ENCOUNTER — Encounter (HOSPITAL_COMMUNITY): Payer: Self-pay | Admitting: *Deleted

## 2015-04-27 ENCOUNTER — Emergency Department (HOSPITAL_COMMUNITY)
Admission: EM | Admit: 2015-04-27 | Discharge: 2015-04-27 | Disposition: A | Discharge status: Home / Self Care | Payer: Medicare Other | Attending: Emergency Medicine | Admitting: Emergency Medicine

## 2015-04-27 ENCOUNTER — Emergency Department (HOSPITAL_COMMUNITY): Payer: Medicare Other

## 2015-04-27 DIAGNOSIS — N644 Mastodynia: Secondary | ICD-10-CM | POA: Insufficient documentation

## 2015-04-27 DIAGNOSIS — N182 Chronic kidney disease, stage 2 (mild): Secondary | ICD-10-CM | POA: Diagnosis not present

## 2015-04-27 DIAGNOSIS — I2510 Atherosclerotic heart disease of native coronary artery without angina pectoris: Secondary | ICD-10-CM | POA: Insufficient documentation

## 2015-04-27 DIAGNOSIS — G8929 Other chronic pain: Secondary | ICD-10-CM | POA: Insufficient documentation

## 2015-04-27 DIAGNOSIS — I129 Hypertensive chronic kidney disease with stage 1 through stage 4 chronic kidney disease, or unspecified chronic kidney disease: Secondary | ICD-10-CM | POA: Insufficient documentation

## 2015-04-27 DIAGNOSIS — R011 Cardiac murmur, unspecified: Secondary | ICD-10-CM | POA: Insufficient documentation

## 2015-04-27 DIAGNOSIS — R079 Chest pain, unspecified: Secondary | ICD-10-CM | POA: Diagnosis not present

## 2015-04-27 DIAGNOSIS — R002 Palpitations: Secondary | ICD-10-CM | POA: Diagnosis not present

## 2015-04-27 LAB — BASIC METABOLIC PANEL
Anion gap: 8 (ref 5–15)
BUN: 17 mg/dL (ref 6–20)
CHLORIDE: 106 mmol/L (ref 101–111)
CO2: 24 mmol/L (ref 22–32)
CREATININE: 1.34 mg/dL — AB (ref 0.44–1.00)
Calcium: 9.6 mg/dL (ref 8.9–10.3)
GFR calc non Af Amer: 41 mL/min — ABNORMAL LOW (ref >60)
GFR, EST AFRICAN AMERICAN: 48 mL/min — AB (ref >60)
GLUCOSE: 134 mg/dL — AB (ref 65–99)
Potassium: 4.3 mmol/L (ref 3.5–5.1)
SODIUM: 138 mmol/L (ref 135–145)

## 2015-04-27 LAB — CBC
HEMATOCRIT: 36.2 % (ref 36.0–46.0)
HEMOGLOBIN: 11.5 g/dL — AB (ref 12.0–15.0)
MCH: 29.1 pg (ref 26.0–34.0)
MCHC: 31.8 g/dL (ref 30.0–36.0)
MCV: 91.6 fL (ref 78.0–100.0)
Platelets: 287 10*3/uL (ref 150–400)
RBC: 3.95 MIL/uL (ref 3.87–5.11)
RDW: 14.2 % (ref 11.5–15.5)
WBC: 6.8 10*3/uL (ref 4.0–10.5)

## 2015-04-27 LAB — I-STAT TROPONIN, ED: TROPONIN I, POC: 0.01 ng/mL (ref 0.00–0.08)

## 2015-04-27 MED ORDER — CARVEDILOL 25 MG PO TABS: 25.0000 mg | ORAL_TABLET | Freq: Two times a day (BID) | ORAL | Status: DC

## 2015-04-27 MED ORDER — HYDRALAZINE HCL 50 MG PO TABS: ORAL_TABLET | ORAL | Status: DC

## 2015-04-27 NOTE — Telephone Encounter (Signed)
Refills sent to cover pt until next appt.

## 2015-04-27 NOTE — Telephone Encounter (Signed)
New message   Pt wants RN to call her she has an appt scheduled but she states that she needs her medication now  And the pharmacy will not fill it yet

## 2015-04-27 NOTE — ED Notes (Signed)
Called and no answer to re-vitalize

## 2015-04-27 NOTE — ED Notes (Signed)
Per EMS- pt woke this morning with dizziness that was worse with standing. Pt also reports pain to left breast with palpation. Pt was 190/100 BP. Pt denies any complaints at this time. States that she came in today as well to have her prescriptions refilled because she is not able to see her MD till next month.

## 2015-05-24 NOTE — H&P (Signed)
Erin Riley is an 65 y.o. female. [id number] with postmenopausal bleeding and questionable mass noted on u/s on right ovary. Pt was seen in office for several visits when reported postmenopausal bleeding. Pt was to be scheduled for [facility] hysteroscopy and diagnostic laparoscopy earlier however she did not appear to understand when counseled and was later determined to be off her psychiatry medications. With help of family pt received treatment and subsequent counseling regarding thickened endometrial lining and questionable mass near right ovary. Pt consented to treatment. Family member present and also confirmed agreement with plan of care  Pertinent Gynecological History: Menses: post-menopausal Bleeding: post menopausal bleeding Contraception: none DES exposure: unknown Blood transfusions: none Sexually transmitted diseases: no past history Previous GYN Procedures: emb  Last pap: normal Date: 12/21/2014 OB History: G4, P4004   Menstrual History: Menarche age: unknown  Patient's last menstrual period was 05/10/2013.    Past Medical History  Diagnosis Date  . Hypertension   . Left ventricular diastolic dysfunction, NYHA class 1 March 20    Severe concentric LVH (likely hypertensive), mild aortic sclerosis. Grade 1 diastolic dysfunction  . Coronary artery disease 04/2009    50% stenosis in the perforator of LAD; catheterization was for an abnormal Myoview in January 2000 showing anterior and inferolateral ischemia.  [name] PAD (peripheral artery disease) [facility]) March 2013    Lower extremity Dopplers: R. SFA 50-60%, R. PTA proximally occluded with distal reconstitution;; L. common iliac ~50%, L. SFA 50-70% stenosis, L. PTA < 50%  . Hyperlipidemia   . Chronic kidney disease (CKD), stage II (mild)     Class I-II  . History of schizophrenia     However I am not sure about the validity of this. She is not on any medications.  . Chronic back pain   . 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 At least 3 subsequent echocardiograms have shown improved/normal EF with moderate to severe concentric LVH and diastolic dysfunction with LVOT/intracavitary gradient  . Heart murmur     Previously reported moderate to severe MR. Most recent echo in May 2014 revealed mild MR.  [name] Hypertensive hypertrophic cardiomyopathy: NYHA class II:  Echo: Severe concentric LVH with LV OT gradient; essentially preserved EF with diastolic dysfunction 02/15/2013    Not significantly symptomatic; echocardiogram from May 2014: Severe concentric LVH with EF 50-55%. Somewhat unbelievably grade 1 diastolic dysfunction with moderate LE dilation. Trivial MR noted.    Past Surgical History  Procedure Laterality Date  . Bunionectomy    . Nm myocar perf wall motion  03/2009    Persantine; EF 51%-both anterior and inferolateral ischemia  . Lower extremity doppler  05/29/2011    right SFA 50% to 59% diameter reduction,right posterior tibal atreery occlusive disease,reconstituting distally, left common illiac<50%,left SFA 50 to70%,left post. tibial <50%  . Doppler echocardiography  May 2014    EF 50-55%; severe concentric LVH; only grade 1 diastolic dysfunction. Mild aortic sclerosis - with LVOT /intracavitary gradient of roughly 20 mmHg mean. Mild to moderately dilated LA;; previously reported MR not seen  . Doppler echocardiography  02/02/2009    mod to severe regurg mitral, EF 20 TO 25%, tricuspid severe regurg. ,pulmonic moderate regurg.  . Cardiac catheterization  04/09/2009    rt and lt cath -non obstructive coronary disease   . Carotid doppler  05/29/2011    left bulb/prox ICA moderate amtfibrous plaque with  no evidence significant reduction.,right bulb /proximal ICA normal patency    Family History  Problem Relation Age of Onset  . Hypertension Mother     Social History:  reports that she  quit smoking about 13 years ago. She has never used smokeless tobacco. She reports that she does not drink alcohol or use illicit drugs.  Allergies: No Known Allergies  No prescriptions prior to admission    Review of Systems  Constitutional: Negative for fever, chills and malaise/fatigue.  Eyes: Negative for blurred vision.  Respiratory: Negative for shortness of breath.   Cardiovascular: Negative for chest pain.  Gastrointestinal: Positive for abdominal pain. Negative for heartburn, nausea and vomiting.  Genitourinary: Negative for dysuria.  Musculoskeletal: Negative for back pain.  Neurological: Negative for dizziness, tingling, weakness and headaches.  Endo/Heme/Allergies: Bruises/bleeds easily.  Psychiatric/Behavioral: Negative for memory loss.       Hx of bipolar disorder    Last menstrual period 05/10/2013. Physical Exam  Constitutional: She is oriented to person, place, and time. She appears well-developed and well-nourished.  Neck: Normal range of motion.  Respiratory: Effort normal.  GI: Soft.  Genitourinary: Vagina normal and uterus normal.  Musculoskeletal: Normal range of motion. She exhibits no edema or tenderness.  Neurological: She is alert and oriented to person, place, and time.  Skin: Skin is warm.  Psychiatric: She has a normal mood and affect. Her behavior is normal.    No results found for this or any previous visit (from the past 24 hour(s)).  No results found.  Assessment/Plan: 65yo G4P4 postmenopausal female with bleeding To OR for diagnostic laparoscopy and hysteroscopy with D&C  Consent confirmed and all questions answered  Edwinna AreolaCecilia Worema Camaya Gannett 05/24/2015, 12:43 PM

## 2015-06-01 NOTE — Patient Instructions (Signed)
Your procedure is scheduled on:  Monday, June 14, 2015  [facility] at:  6:00 AM  Pick up the phone at the desk and dial [phone].  Call this number if you have problems the morning of surgery: [phone].  Remember: Do NOT eat food or drink after:  Midnight Sunday  Take these medicines the morning of surgery with a SIP OF WATER:  Amlodipine, Atorvastatin, Carvedilol, Digoxin, Hydralazine, Isosorbide, Spironolactone, Sucralfate  Do NOT wear jewelry (body piercing), metal hair clips/bobby pins, make-up, or nail polish. Do NOT wear lotions, powders, or perfumes.  You may wear deodorant. Do NOT shave for 48 hours prior to surgery. Do NOT bring valuables to the hospital. Contacts, dentures, or bridgework may not be worn into surgery.  Have a responsible adult drive you home and stay with you for 24 hours after your procedure

## 2015-06-02 ENCOUNTER — Encounter (HOSPITAL_COMMUNITY)
Admission: RE | Admit: 2015-06-02 | Discharge: 2015-06-02 | Disposition: A | Discharge status: Home / Self Care | Payer: Medicare Other | Source: Ambulatory Visit | Attending: Obstetrics and Gynecology | Admitting: Obstetrics and Gynecology

## 2015-06-02 ENCOUNTER — Encounter (HOSPITAL_COMMUNITY): Payer: Self-pay | Admitting: Anesthesiology

## 2015-06-02 ENCOUNTER — Encounter (HOSPITAL_COMMUNITY): Payer: Self-pay

## 2015-06-02 DIAGNOSIS — Z01812 Encounter for preprocedural laboratory examination: Secondary | ICD-10-CM | POA: Diagnosis not present

## 2015-06-02 HISTORY — DX: Schizophrenia, unspecified: F20.9

## 2015-06-02 LAB — BASIC METABOLIC PANEL
Anion gap: 7 (ref 5–15)
BUN: 19 mg/dL (ref 6–20)
CO2: 28 mmol/L (ref 22–32)
CREATININE: 1.18 mg/dL — AB (ref 0.44–1.00)
Calcium: 9.5 mg/dL (ref 8.9–10.3)
Chloride: 103 mmol/L (ref 101–111)
GFR, EST AFRICAN AMERICAN: 55 mL/min — AB (ref >60)
GFR, EST NON AFRICAN AMERICAN: 48 mL/min — AB (ref >60)
Glucose, Bld: 113 mg/dL — ABNORMAL HIGH (ref 65–99)
POTASSIUM: 4.1 mmol/L (ref 3.5–5.1)
SODIUM: 138 mmol/L (ref 135–145)

## 2015-06-02 LAB — CBC
HCT: 36 % (ref 36.0–46.0)
Hemoglobin: 11.9 g/dL — ABNORMAL LOW (ref 12.0–15.0)
MCH: 30.1 pg (ref 26.0–34.0)
MCHC: 33.1 g/dL (ref 30.0–36.0)
MCV: 91.1 fL (ref 78.0–100.0)
PLATELETS: 339 10*3/uL (ref 150–400)
RBC: 3.95 MIL/uL (ref 3.87–5.11)
RDW: 13.5 % (ref 11.5–15.5)
WBC: 7.3 10*3/uL (ref 4.0–10.5)

## 2015-06-02 LAB — TYPE AND SCREEN
ABO/RH(D): B POS
Antibody Screen: NEGATIVE

## 2015-06-02 LAB — ABO/RH: ABO/RH(D): B POS

## 2015-06-09 ENCOUNTER — Telehealth: Payer: Self-pay | Admitting: Cardiology

## 2015-06-09 NOTE — Telephone Encounter (Signed)
So, I have not seen her in over a year. I cannot comment on her Cardiac Risk Assessment from 1 year ago. I do not remember getting a Pre-op letter either.  She seemed to have done well for her back surgery in July & will probably do fine for this surgery.   She has Hypertensive Hypertrophic Cardiomyopathy without active CHF (per my last note), moderate (non-obstructive CAD), no DM, normal renal function & no CVA.  Her surgery is "Low Risk."  - If she has not had any CHF or Angina Sx in the past ~month, she is probably OK.  I just cannot say that I have seen her.  I do no think that I would do any additional evaluation of her pre-op.  Her BP was controlled last visit.   Maybe she can get in to see an APP to assess her Sx ?  If not.  All I can do is comment on her medical history.  If it is an urgent procedure, they should just proceed.  No testing will change her pre-existing conditions.  Antwain Caliendo W

## 2015-06-09 NOTE — Telephone Encounter (Signed)
She is checking on clearance she sent over 06-07-15. Pt is scheduled for Monday,need this asap please.

## 2015-06-10 NOTE — Pre-Procedure Instructions (Signed)
Dr. Berneice HeinrichManny reviewed Dr. Elissa HeftyHarding's [facility] 06/09/[facility].  Dr. Berneice HeinrichManny requests patient is seen by cardiology to obtain a surgical clearance for this procedure due to patient's elevated blood pressures at her pre op appointment and the laparoscopic procedure she is scheduled for.  We can not proceed with the procedure until cardiac clearance is obtained.  Jamie at Dr. Shella SpearingBanga's office was made aware.  She has agreed to cancel surgery for Monday, June 14, 2015 and reschedule after cardiac clearance is received.

## 2015-06-11 NOTE — Telephone Encounter (Signed)
Patient has schedule appointment 06/15/15

## 2015-06-11 NOTE — Telephone Encounter (Signed)
SENT INFORMATION TO JAIMIE  AT  DR  Pryor OchoaECILIA BANGA [facility]

## 2015-06-14 ENCOUNTER — Ambulatory Visit (HOSPITAL_COMMUNITY)
Admission: RE | Admit: 2015-06-14 | Payer: Medicare Other | Source: Ambulatory Visit | Admitting: Obstetrics and Gynecology

## 2015-06-14 ENCOUNTER — Encounter (HOSPITAL_COMMUNITY): Admission: RE | Payer: Self-pay | Source: Ambulatory Visit

## 2015-06-14 SURGERY — DILATATION AND CURETTAGE /HYSTEROSCOPY
Anesthesia: Choice

## 2015-06-15 ENCOUNTER — Encounter: Payer: Self-pay | Admitting: Cardiology

## 2015-06-15 ENCOUNTER — Ambulatory Visit (INDEPENDENT_AMBULATORY_CARE_PROVIDER_SITE_OTHER): Payer: Medicare Other | Admitting: Cardiology

## 2015-06-15 VITALS — BP 160/90 | HR 74 | Wt 245.4 lb

## 2015-06-15 DIAGNOSIS — I422 Other hypertrophic cardiomyopathy: Secondary | ICD-10-CM | POA: Diagnosis not present

## 2015-06-15 DIAGNOSIS — I119 Hypertensive heart disease without heart failure: Secondary | ICD-10-CM

## 2015-06-15 DIAGNOSIS — I10 Essential (primary) hypertension: Secondary | ICD-10-CM

## 2015-06-15 DIAGNOSIS — Z01810 Encounter for preprocedural cardiovascular examination: Secondary | ICD-10-CM | POA: Diagnosis not present

## 2015-06-15 DIAGNOSIS — E785 Hyperlipidemia, unspecified: Secondary | ICD-10-CM | POA: Diagnosis not present

## 2015-06-15 DIAGNOSIS — E669 Obesity, unspecified: Secondary | ICD-10-CM

## 2015-06-15 MED ORDER — HYDRALAZINE HCL 50 MG PO TABS: ORAL_TABLET | ORAL | Status: DC

## 2015-06-15 MED ORDER — SPIRONOLACTONE 25 MG PO TABS
25.0000 mg | ORAL_TABLET | Freq: Every day | ORAL | Status: DC
Start: 2015-06-15 — End: 2016-10-27

## 2015-06-15 NOTE — Assessment & Plan Note (Signed)
Significant weight gain with her loss mobility now. We talked about the importance of dietary adjustment and hopefully she can get some therapy for her knee and will get back in exercising.
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
## 2015-06-15 NOTE — Assessment & Plan Note (Signed)
Labs are followed by her PCP. She is on atorvastatin.

## 2015-06-15 NOTE — Assessment & Plan Note (Signed)
Not well controlled. Increase hydralazine to 75 mg 3 times a day and spironolactone 25 mg daily.

## 2015-06-15 NOTE — Assessment & Plan Note (Signed)
No obvious diastolic heart failure symptoms. She does have probable hypertensive heart disease. Not currently on diuretic. On max dose amlodipine, carvedilol, spironolactone and  hydralazine/Imdur.-- Increasing hydralazine and spironolactone dosing

## 2015-06-15 NOTE — Assessment & Plan Note (Signed)
No obvious diastolic heart failure symptoms. She does have probable hypertensive heart disease. Not currently on diuretic. On max dose amlodipine, carvedilol, spironolactone and  hydralazine/Imdur.-- Increasing hydralazine and spironolactone dosing 

## 2015-06-15 NOTE — Patient Instructions (Addendum)
Okay for surgery -with OB- GYN  PLEASE CONTACT YOUR PRIMARY OR ORTHOPEDIC DOCTOR ABOUT YOUR KNEE PAIN   INCREASE HYDRALAZINE TO 1 AND 1/2 TABLETS  -TAKE THREE TIMES A DAY  INCREASE SPIROLACTONE TO 25 MG ONE TABLET DAILY  Your physician wants you to follow-up in 12 MONTHS WITH DR HARDING.  You will receive a reminder letter in the mail two months in advance. If you don't receive a letter, please call our office to schedule the follow-up appointment.  If you need a refill on your cardiac medications before your next appointment, please call your pharmacy.

## 2015-06-15 NOTE — Assessment & Plan Note (Signed)
She has hypertension, but no signs of heart failure. No CAD or history of stroke. She is not diabetic/on insulin. Planned surgery is low risk.   She will be a low risk patient for low risk surgery on the ACC/AHA guidelines. No further cardiac evaluation is required.

## 2015-07-01 ENCOUNTER — Telehealth: Payer: Self-pay | Admitting: *Deleted

## 2015-07-01 NOTE — Telephone Encounter (Signed)
Routed last office visit  06/15/15 Gives clearance for upcoming hysteroscopy/d/c, diagnostic laparoscopy

## 2015-07-16 ENCOUNTER — Other Ambulatory Visit: Payer: Self-pay | Admitting: *Deleted

## 2015-07-16 MED ORDER — ATORVASTATIN CALCIUM 40 MG PO TABS: 40.0000 mg | ORAL_TABLET | Freq: Every day | ORAL | Status: DC

## 2015-07-16 NOTE — Telephone Encounter (Signed)
Rx(s) sent to pharmacy electronically.  

## 2015-07-23 ENCOUNTER — Other Ambulatory Visit: Payer: Self-pay | Admitting: *Deleted

## 2015-07-23 MED ORDER — ISOSORBIDE MONONITRATE ER 60 MG PO TB24: 90.0000 mg | ORAL_TABLET | Freq: Every day | ORAL | Status: DC

## 2015-07-23 MED ORDER — DIGOXIN 125 MCG PO TABS: 0.1250 mg | ORAL_TABLET | Freq: Every day | ORAL | Status: DC

## 2015-07-23 NOTE — Telephone Encounter (Signed)
Rx(s) sent to pharmacy electronically.  

## 2015-07-28 ENCOUNTER — Encounter (HOSPITAL_COMMUNITY): Admission: RE | Payer: Self-pay | Source: Ambulatory Visit

## 2015-07-28 ENCOUNTER — Ambulatory Visit (HOSPITAL_COMMUNITY)
Admission: RE | Admit: 2015-07-28 | Payer: Medicare Other | Source: Ambulatory Visit | Admitting: Obstetrics and Gynecology

## 2015-07-28 SURGERY — LAPAROSCOPY, DIAGNOSTIC
Anesthesia: Choice

## 2015-08-12 ENCOUNTER — Other Ambulatory Visit: Payer: Self-pay | Admitting: Cardiology

## 2015-08-12 MED ORDER — CARVEDILOL 25 MG PO TABS: 25.0000 mg | ORAL_TABLET | Freq: Two times a day (BID) | ORAL | Status: DC

## 2015-08-12 NOTE — Telephone Encounter (Signed)
°*  STAT* If patient is at the pharmacy, call can be transferred to refill team.   1. Which medications need to be refilled? (please list name of each medication and dose if known) Carvedilol  25 mg   2. Which pharmacy/location (including street and city if local pharmacy) is medication to be sent to? [facility] [address]   3. Do they need a 30 day or 90 day supply? 90

## 2015-08-12 NOTE — Telephone Encounter (Signed)
Rx(s) sent to pharmacy electronically.  

## 2016-02-09 ENCOUNTER — Emergency Department (HOSPITAL_COMMUNITY): Payer: Medicare Other

## 2016-02-09 ENCOUNTER — Encounter (HOSPITAL_COMMUNITY): Payer: Self-pay | Admitting: *Deleted

## 2016-02-09 ENCOUNTER — Emergency Department (HOSPITAL_COMMUNITY)
Admission: EM | Admit: 2016-02-09 | Discharge: 2016-02-10 | Disposition: A | Discharge status: Home / Self Care | Payer: Medicare Other | Attending: Emergency Medicine | Admitting: Emergency Medicine

## 2016-02-09 DIAGNOSIS — Z87891 Personal history of nicotine dependence: Secondary | ICD-10-CM | POA: Insufficient documentation

## 2016-02-09 DIAGNOSIS — Z79899 Other long term (current) drug therapy: Secondary | ICD-10-CM | POA: Diagnosis not present

## 2016-02-09 DIAGNOSIS — N183 Chronic kidney disease, stage 3 (moderate): Secondary | ICD-10-CM | POA: Insufficient documentation

## 2016-02-09 DIAGNOSIS — R1084 Generalized abdominal pain: Secondary | ICD-10-CM | POA: Diagnosis not present

## 2016-02-09 DIAGNOSIS — R1030 Lower abdominal pain, unspecified: Secondary | ICD-10-CM | POA: Diagnosis present

## 2016-02-09 DIAGNOSIS — I129 Hypertensive chronic kidney disease with stage 1 through stage 4 chronic kidney disease, or unspecified chronic kidney disease: Secondary | ICD-10-CM | POA: Insufficient documentation

## 2016-02-09 DIAGNOSIS — Z7982 Long term (current) use of aspirin: Secondary | ICD-10-CM | POA: Insufficient documentation

## 2016-02-09 DIAGNOSIS — I2510 Atherosclerotic heart disease of native coronary artery without angina pectoris: Secondary | ICD-10-CM | POA: Insufficient documentation

## 2016-02-09 LAB — COMPREHENSIVE METABOLIC PANEL
ALBUMIN: 3.9 g/dL (ref 3.5–5.0)
ALT: 21 U/L (ref 14–54)
AST: 25 U/L (ref 15–41)
Alkaline Phosphatase: 75 U/L (ref 38–126)
Anion gap: 8 (ref 5–15)
BILIRUBIN TOTAL: 0.6 mg/dL (ref 0.3–1.2)
BUN: 16 mg/dL (ref 6–20)
CO2: 24 mmol/L (ref 22–32)
Calcium: 9.9 mg/dL (ref 8.9–10.3)
Chloride: 105 mmol/L (ref 101–111)
Creatinine, Ser: 1.5 mg/dL — ABNORMAL HIGH (ref 0.44–1.00)
GFR calc Af Amer: 41 mL/min — ABNORMAL LOW (ref >60)
GFR calc non Af Amer: 36 mL/min — ABNORMAL LOW (ref >60)
GLUCOSE: 123 mg/dL — AB (ref 65–99)
POTASSIUM: 3.8 mmol/L (ref 3.5–5.1)
SODIUM: 137 mmol/L (ref 135–145)
TOTAL PROTEIN: 7.2 g/dL (ref 6.5–8.1)

## 2016-02-09 LAB — URINALYSIS, ROUTINE W REFLEX MICROSCOPIC
Bilirubin Urine: NEGATIVE
Glucose, UA: NEGATIVE mg/dL
Hgb urine dipstick: NEGATIVE
KETONES UR: NEGATIVE mg/dL
NITRITE: NEGATIVE
PROTEIN: 30 mg/dL — AB
Specific Gravity, Urine: 1.023 (ref 1.005–1.030)
pH: 5 (ref 5.0–8.0)

## 2016-02-09 LAB — CBC
HEMATOCRIT: 40.2 % (ref 36.0–46.0)
Hemoglobin: 13.2 g/dL (ref 12.0–15.0)
MCH: 30.3 pg (ref 26.0–34.0)
MCHC: 32.8 g/dL (ref 30.0–36.0)
MCV: 92.2 fL (ref 78.0–100.0)
Platelets: 333 10*3/uL (ref 150–400)
RBC: 4.36 MIL/uL (ref 3.87–5.11)
RDW: 13 % (ref 11.5–15.5)
WBC: 7.2 10*3/uL (ref 4.0–10.5)

## 2016-02-09 MED ORDER — HYOSCYAMINE SULFATE 0.5 MG/ML IJ SOLN
0.1250 mg | Freq: Once | INTRAMUSCULAR | Status: AC
  Administered 2016-02-10: 0.125 mg via INTRAVENOUS
  Filled 2016-02-09: qty 0.25

## 2016-02-09 NOTE — ED Triage Notes (Signed)
Pt c/o LLQ pain onset today, denies NVD
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
## 2016-02-09 NOTE — ED Notes (Signed)
Patient transported to X-ray 

## 2016-02-10 ENCOUNTER — Encounter (HOSPITAL_COMMUNITY): Payer: Self-pay | Admitting: *Deleted

## 2016-02-10 ENCOUNTER — Emergency Department (HOSPITAL_COMMUNITY): Payer: Medicare Other

## 2016-02-10 DIAGNOSIS — R1084 Generalized abdominal pain: Secondary | ICD-10-CM | POA: Diagnosis not present

## 2016-02-10 MED ORDER — DICYCLOMINE HCL 20 MG PO TABS: 20.0000 mg | ORAL_TABLET | Freq: Two times a day (BID) | ORAL | 0 refills | Status: DC

## 2016-02-10 MED ORDER — IOPAMIDOL (ISOVUE-300) INJECTION 61%
INTRAVENOUS | Status: AC
Start: 2016-02-10 — End: 2016-02-10
  Administered 2016-02-10: 80 mL
  Filled 2016-02-10: qty 100

## 2016-02-10 NOTE — ED Notes (Signed)
Patient transported to CT 

## 2016-02-12 LAB — URINE CULTURE

## 2016-02-13 ENCOUNTER — Telehealth: Payer: Self-pay

## 2016-02-13 NOTE — Progress Notes (Signed)
ED Antimicrobial Stewardship Positive Culture Follow Up   Erin Riley is an 65 y.o. female who [facility] with a chief complaint of   Chief Complaint  Patient presents with  . Abdominal Pain    Recent Results (from the past 720 hour(s))  Urine culture     Status: Abnormal   Collection Time: 02/09/16 10:30 AM  Result Value Ref Range Status   Specimen Description URINE, RANDOM  Final   Special Requests NONE  Final   Culture >=100,000 COLONIES/mL ENTEROCOCCUS FAECALIS (A)  Final   Report Status 02/12/2016 FINAL  Final   Organism ID, Bacteria ENTEROCOCCUS FAECALIS (A)  Final      Susceptibility   Enterococcus faecalis - MIC*    AMPICILLIN <=2 SENSITIVE Sensitive     LEVOFLOXACIN 2 SENSITIVE Sensitive     NITROFURANTOIN <=16 SENSITIVE Sensitive     VANCOMYCIN 1 SENSITIVE Sensitive     * >=100,000 COLONIES/mL ENTEROCOCCUS FAECALIS    [x]  Patient discharged originally without antimicrobial agent and treatment is now indicated  New antibiotic prescription: Amoxicillin 500 mg PO every 8 hours for 7 days   ED Provider: Georgiann Mohshris Lauyer PA-C   Erin Riley 02/13/2016, 8:37 AM Infectious Diseases Pharmacist Phone# [phone] for 02/13/16

## 2016-02-13 NOTE — Telephone Encounter (Signed)
Post ED Visit - Positive Culture Follow-up: Unsuccessful Patient Follow-up  Culture assessed and recommendations reviewed by: []  Enzo BiNathan Batchelder, Pharm.D. []  Celedonio MiyamotoJeremy Frens, Pharm.D., BCPS []  Garvin FilaMike Maccia, Pharm.D. []  Georgina PillionElizabeth Martin, Pharm.D., BCPS []  PastoriaMinh Pham, [address] BoulevardPharm.D., BCPS, AAHIVP []  Estella HuskMichelle Turner, Pharm.D., BCPS, AAHIVP []  Tennis Mustassie Stewart, Pharm.D. []  Sherle Poeob Vincent, [place]. Pollyann SamplesAndy Johnston Pharm D Positive urine culture  [x]  Patient discharged without antimicrobial prescription and treatment is now indicated []  Organism is resistant to prescribed ED discharge antimicrobial []  Patient with positive blood cultures   Unable to contact patient after 3 attempts, letter will be sent to address on file  Jerry CarasCullom, Alexius Hangartner Burnett 02/13/2016, 9:14 AM

## 2016-03-06 ENCOUNTER — Encounter (HOSPITAL_COMMUNITY): Payer: Self-pay

## 2016-03-06 ENCOUNTER — Emergency Department (HOSPITAL_COMMUNITY)
Admission: EM | Admit: 2016-03-06 | Discharge: 2016-03-07 | Disposition: A | Discharge status: Home / Self Care | Payer: Medicare Other | Attending: Emergency Medicine | Admitting: Emergency Medicine

## 2016-03-06 DIAGNOSIS — I129 Hypertensive chronic kidney disease with stage 1 through stage 4 chronic kidney disease, or unspecified chronic kidney disease: Secondary | ICD-10-CM | POA: Insufficient documentation

## 2016-03-06 DIAGNOSIS — I2510 Atherosclerotic heart disease of native coronary artery without angina pectoris: Secondary | ICD-10-CM | POA: Insufficient documentation

## 2016-03-06 DIAGNOSIS — Z79899 Other long term (current) drug therapy: Secondary | ICD-10-CM | POA: Insufficient documentation

## 2016-03-06 DIAGNOSIS — R1084 Generalized abdominal pain: Secondary | ICD-10-CM | POA: Insufficient documentation

## 2016-03-06 DIAGNOSIS — N183 Chronic kidney disease, stage 3 (moderate): Secondary | ICD-10-CM | POA: Diagnosis not present

## 2016-03-06 DIAGNOSIS — R1010 Upper abdominal pain, unspecified: Secondary | ICD-10-CM | POA: Diagnosis present

## 2016-03-06 LAB — URINALYSIS, ROUTINE W REFLEX MICROSCOPIC
Bacteria, UA: NONE SEEN
Bilirubin Urine: NEGATIVE
Glucose, UA: NEGATIVE mg/dL
HGB URINE DIPSTICK: NEGATIVE
Ketones, ur: NEGATIVE mg/dL
Nitrite: NEGATIVE
PH: 5 (ref 5.0–8.0)
Protein, ur: NEGATIVE mg/dL
SPECIFIC GRAVITY, URINE: 1.011 (ref 1.005–1.030)

## 2016-03-06 MED ORDER — ONDANSETRON HCL 4 MG/2ML IJ SOLN
4.0000 mg | Freq: Once | INTRAMUSCULAR | Status: AC
  Administered 2016-03-06: 4 mg via INTRAVENOUS
  Filled 2016-03-06: qty 2

## 2016-03-06 MED ORDER — FENTANYL CITRATE (PF) 100 MCG/2ML IJ SOLN
50.0000 ug | INTRAMUSCULAR | Status: DC | PRN
  Administered 2016-03-06: 50 ug via INTRAVENOUS
  Filled 2016-03-06: qty 2

## 2016-03-06 MED ORDER — SODIUM CHLORIDE 0.9 % IV BOLUS (SEPSIS)
1000.0000 mL | Freq: Once | INTRAVENOUS | Status: AC
  Administered 2016-03-06: 1000 mL via INTRAVENOUS

## 2016-03-06 NOTE — ED Notes (Signed)
ED Provider at bedside. 

## 2016-03-06 NOTE — ED Triage Notes (Signed)
Patient presents with ems after having left lower abdominal pain, a headache, nausea, vomiting and hypertension that started this morning.  She has a hx of htn. Alert and oriented and ambulatory. No neurological deficits present.
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
## 2016-03-07 DIAGNOSIS — R1084 Generalized abdominal pain: Secondary | ICD-10-CM | POA: Diagnosis not present

## 2016-03-07 LAB — COMPREHENSIVE METABOLIC PANEL
ALK PHOS: 67 U/L (ref 38–126)
ALT: 22 U/L (ref 14–54)
AST: 22 U/L (ref 15–41)
Albumin: 3.7 g/dL (ref 3.5–5.0)
Anion gap: 7 (ref 5–15)
BILIRUBIN TOTAL: 0.4 mg/dL (ref 0.3–1.2)
BUN: 19 mg/dL (ref 6–20)
CALCIUM: 9.8 mg/dL (ref 8.9–10.3)
CHLORIDE: 103 mmol/L (ref 101–111)
CO2: 26 mmol/L (ref 22–32)
CREATININE: 1.23 mg/dL — AB (ref 0.44–1.00)
GFR calc Af Amer: 53 mL/min — ABNORMAL LOW (ref >60)
GFR, EST NON AFRICAN AMERICAN: 45 mL/min — AB (ref >60)
Glucose, Bld: 103 mg/dL — ABNORMAL HIGH (ref 65–99)
Potassium: 4.8 mmol/L (ref 3.5–5.1)
Sodium: 136 mmol/L (ref 135–145)
Total Protein: 7.2 g/dL (ref 6.5–8.1)

## 2016-03-07 LAB — LIPASE, BLOOD: LIPASE: 38 U/L (ref 11–51)

## 2016-03-07 LAB — CBC WITH DIFFERENTIAL/PLATELET
BASOS ABS: 0 10*3/uL (ref 0.0–0.1)
Basophils Relative: 0 %
Eosinophils Absolute: 0.2 10*3/uL (ref 0.0–0.7)
Eosinophils Relative: 3 %
HEMATOCRIT: 39.5 % (ref 36.0–46.0)
HEMOGLOBIN: 12.8 g/dL (ref 12.0–15.0)
LYMPHS PCT: 43 %
Lymphs Abs: 2.6 10*3/uL (ref 0.7–4.0)
MCH: 30.8 pg (ref 26.0–34.0)
MCHC: 32.4 g/dL (ref 30.0–36.0)
MCV: 95.2 fL (ref 78.0–100.0)
Monocytes Absolute: 0.3 10*3/uL (ref 0.1–1.0)
Monocytes Relative: 6 %
NEUTROS ABS: 2.9 10*3/uL (ref 1.7–7.7)
Neutrophils Relative %: 48 %
PLATELETS: 279 10*3/uL (ref 150–400)
RBC: 4.15 MIL/uL (ref 3.87–5.11)
RDW: 13.9 % (ref 11.5–15.5)
WBC: 6 10*3/uL (ref 4.0–10.5)

## 2016-03-07 NOTE — ED Notes (Signed)
Pt ambulated to bathroom 

## 2016-03-08 LAB — URINE CULTURE

## 2016-03-10 ENCOUNTER — Encounter (HOSPITAL_COMMUNITY): Payer: Self-pay

## 2016-03-10 ENCOUNTER — Emergency Department (HOSPITAL_COMMUNITY)
Admission: EM | Admit: 2016-03-10 | Discharge: 2016-03-10 | Disposition: A | Discharge status: Home / Self Care | Payer: Medicare Other | Attending: Emergency Medicine | Admitting: Emergency Medicine

## 2016-03-10 DIAGNOSIS — Z87891 Personal history of nicotine dependence: Secondary | ICD-10-CM | POA: Diagnosis not present

## 2016-03-10 DIAGNOSIS — I129 Hypertensive chronic kidney disease with stage 1 through stage 4 chronic kidney disease, or unspecified chronic kidney disease: Secondary | ICD-10-CM | POA: Diagnosis not present

## 2016-03-10 DIAGNOSIS — N183 Chronic kidney disease, stage 3 (moderate): Secondary | ICD-10-CM | POA: Diagnosis not present

## 2016-03-10 DIAGNOSIS — R1084 Generalized abdominal pain: Secondary | ICD-10-CM | POA: Diagnosis present

## 2016-03-10 DIAGNOSIS — I2510 Atherosclerotic heart disease of native coronary artery without angina pectoris: Secondary | ICD-10-CM | POA: Insufficient documentation

## 2016-03-10 LAB — CBC WITH DIFFERENTIAL/PLATELET
BASOS ABS: 0 10*3/uL (ref 0.0–0.1)
BASOS PCT: 0 %
EOS ABS: 0.2 10*3/uL (ref 0.0–0.7)
EOS PCT: 3 %
HCT: 37.8 % (ref 36.0–46.0)
Hemoglobin: 12.5 g/dL (ref 12.0–15.0)
LYMPHS ABS: 2.4 10*3/uL (ref 0.7–4.0)
Lymphocytes Relative: 37 %
MCH: 30.4 pg (ref 26.0–34.0)
MCHC: 33.1 g/dL (ref 30.0–36.0)
MCV: 92 fL (ref 78.0–100.0)
Monocytes Absolute: 0.5 10*3/uL (ref 0.1–1.0)
Monocytes Relative: 7 %
Neutro Abs: 3.5 10*3/uL (ref 1.7–7.7)
Neutrophils Relative %: 53 %
PLATELETS: 298 10*3/uL (ref 150–400)
RBC: 4.11 MIL/uL (ref 3.87–5.11)
RDW: 13.4 % (ref 11.5–15.5)
WBC: 6.5 10*3/uL (ref 4.0–10.5)

## 2016-03-10 LAB — COMPREHENSIVE METABOLIC PANEL
ALK PHOS: 74 U/L (ref 38–126)
ALT: 22 U/L (ref 14–54)
AST: 22 U/L (ref 15–41)
Albumin: 3.9 g/dL (ref 3.5–5.0)
Anion gap: 7 (ref 5–15)
BILIRUBIN TOTAL: 0.4 mg/dL (ref 0.3–1.2)
BUN: 17 mg/dL (ref 6–20)
CALCIUM: 9.5 mg/dL (ref 8.9–10.3)
CO2: 25 mmol/L (ref 22–32)
CREATININE: 0.88 mg/dL (ref 0.44–1.00)
Chloride: 103 mmol/L (ref 101–111)
Glucose, Bld: 108 mg/dL — ABNORMAL HIGH (ref 65–99)
Potassium: 4.1 mmol/L (ref 3.5–5.1)
Sodium: 135 mmol/L (ref 135–145)
TOTAL PROTEIN: 7.4 g/dL (ref 6.5–8.1)

## 2016-03-10 LAB — URINALYSIS, ROUTINE W REFLEX MICROSCOPIC
Bilirubin Urine: NEGATIVE
GLUCOSE, UA: NEGATIVE mg/dL
Hgb urine dipstick: NEGATIVE
KETONES UR: NEGATIVE mg/dL
Nitrite: NEGATIVE
PH: 6 (ref 5.0–8.0)
Protein, ur: NEGATIVE mg/dL
Specific Gravity, Urine: 1.009 (ref 1.005–1.030)

## 2016-03-10 LAB — WET PREP, GENITAL
CLUE CELLS WET PREP: NONE SEEN
Sperm: NONE SEEN
Trich, Wet Prep: NONE SEEN
Yeast Wet Prep HPF POC: NONE SEEN

## 2016-03-10 LAB — LIPASE, BLOOD: LIPASE: 33 U/L (ref 11–51)

## 2016-03-10 MED ORDER — DICYCLOMINE HCL 10 MG PO CAPS
10.0000 mg | ORAL_CAPSULE | Freq: Once | ORAL | Status: AC
  Administered 2016-03-10: 10 mg via ORAL
  Filled 2016-03-10: qty 1

## 2016-03-10 MED ORDER — ONDANSETRON 4 MG PO TBDP
4.0000 mg | ORAL_TABLET | Freq: Once | ORAL | Status: AC
  Administered 2016-03-10: 4 mg via ORAL
  Filled 2016-03-10: qty 1

## 2016-03-10 MED ORDER — ONDANSETRON HCL 4 MG PO TABS: 4.0000 mg | ORAL_TABLET | Freq: Four times a day (QID) | ORAL | 0 refills | Status: DC

## 2016-03-10 MED ORDER — DICYCLOMINE HCL 20 MG PO TABS: 20.0000 mg | ORAL_TABLET | Freq: Two times a day (BID) | ORAL | 0 refills | Status: DC

## 2016-03-10 NOTE — ED Notes (Signed)
Bed: WA06 Expected date:  Expected time:  Means of arrival:  Comments: EMS-abdominal pain 
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
## 2016-03-10 NOTE — Discharge Instructions (Signed)

## 2016-03-10 NOTE — ED Triage Notes (Signed)
[facility]15 [facility]. [facility] states [facility] was told then ? P[facility]. Pt states she is here for ? Pregnancy. Pt states vaginal discharge with bleeding (spotting) started yesterday. Pt states she has not been to the bathroom to verify if it has continued. Pt stated vomited yesterday however not today.

## 2016-03-10 NOTE — ED Notes (Signed)
EDPA Doug Provider at bedside.

## 2016-03-13 ENCOUNTER — Encounter (HOSPITAL_COMMUNITY): Payer: Self-pay | Admitting: *Deleted

## 2016-03-13 ENCOUNTER — Inpatient Hospital Stay (HOSPITAL_COMMUNITY)
Admission: AD | Admit: 2016-03-13 | Discharge: 2016-03-14 | Disposition: A | Discharge status: Home / Self Care | Payer: Medicare Other | Source: Ambulatory Visit | Attending: Family Medicine | Admitting: Family Medicine

## 2016-03-13 DIAGNOSIS — E785 Hyperlipidemia, unspecified: Secondary | ICD-10-CM | POA: Diagnosis not present

## 2016-03-13 DIAGNOSIS — G8929 Other chronic pain: Secondary | ICD-10-CM | POA: Diagnosis not present

## 2016-03-13 DIAGNOSIS — I10 Essential (primary) hypertension: Secondary | ICD-10-CM | POA: Diagnosis not present

## 2016-03-13 DIAGNOSIS — F209 Schizophrenia, unspecified: Secondary | ICD-10-CM | POA: Diagnosis not present

## 2016-03-13 DIAGNOSIS — I129 Hypertensive chronic kidney disease with stage 1 through stage 4 chronic kidney disease, or unspecified chronic kidney disease: Secondary | ICD-10-CM | POA: Diagnosis not present

## 2016-03-13 DIAGNOSIS — Z7982 Long term (current) use of aspirin: Secondary | ICD-10-CM | POA: Insufficient documentation

## 2016-03-13 DIAGNOSIS — Z87891 Personal history of nicotine dependence: Secondary | ICD-10-CM | POA: Diagnosis not present

## 2016-03-13 DIAGNOSIS — R1030 Lower abdominal pain, unspecified: Secondary | ICD-10-CM | POA: Diagnosis present

## 2016-03-13 DIAGNOSIS — I2510 Atherosclerotic heart disease of native coronary artery without angina pectoris: Secondary | ICD-10-CM | POA: Insufficient documentation

## 2016-03-13 DIAGNOSIS — F458 Other somatoform disorders: Secondary | ICD-10-CM

## 2016-03-13 DIAGNOSIS — N182 Chronic kidney disease, stage 2 (mild): Secondary | ICD-10-CM | POA: Insufficient documentation

## 2016-03-13 DIAGNOSIS — M549 Dorsalgia, unspecified: Secondary | ICD-10-CM | POA: Insufficient documentation

## 2016-03-13 DIAGNOSIS — R109 Unspecified abdominal pain: Secondary | ICD-10-CM | POA: Diagnosis not present

## 2016-03-13 DIAGNOSIS — F22 Delusional disorders: Secondary | ICD-10-CM | POA: Diagnosis not present

## 2016-03-13 LAB — URINALYSIS, ROUTINE W REFLEX MICROSCOPIC
BILIRUBIN URINE: NEGATIVE
GLUCOSE, UA: NEGATIVE mg/dL
HGB URINE DIPSTICK: NEGATIVE
Ketones, ur: NEGATIVE mg/dL
Leukocytes, UA: NEGATIVE
Nitrite: NEGATIVE
PH: 5 (ref 5.0–8.0)
Protein, ur: NEGATIVE mg/dL
SPECIFIC GRAVITY, URINE: 1.014 (ref 1.005–1.030)

## 2016-03-13 LAB — GC/CHLAMYDIA PROBE AMP (~~LOC~~) NOT AT ARMC
Chlamydia: NEGATIVE
Neisseria Gonorrhea: NEGATIVE

## 2016-03-13 LAB — POCT PREGNANCY, URINE: Preg Test, Ur: NEGATIVE

## 2016-03-13 NOTE — MAU Provider Note (Signed)
History     CSN: [id number]  Arrival date and time: 03/13/16 2244   First Provider Initiated Contact with Patient 03/13/16 2343      Chief Complaint  Patient presents with  . Abdominal Pain   Erin Riley is a 66 y.o. [phone] who presents today with lower abdominal pain. She reported to the RN that she was [redacted] weeks pregnant, and having contractions. She reports to me that she is here because "I have had a miscarriage before." LMP 05/2013. She states that she see Dr. Ronne Binning for PCP. She has a hx of HTN, and she is on apresoline.    Abdominal Pain  This is a new problem. The current episode started today. The onset quality is gradual. The problem occurs intermittently. The problem has been unchanged. The pain is located in the suprapubic region. The pain is at a severity of 8/10. The quality of the pain is cramping. The abdominal pain does not radiate. Associated symptoms include constipation (last BM about 15 mins PTA. ) and dysuria. Pertinent negatives include no fever. Nothing aggravates the pain. The pain is relieved by nothing.    Past Medical History:  Diagnosis Date  . Chronic back pain   . Chronic kidney disease (CKD), stage II (mild)    Class I-II  . Coronary artery disease 04/2009   50% stenosis in the perforator of LAD; catheterization was for an abnormal Myoview in January 2000 showing anterior and inferolateral ischemia.  [name] Heart murmur    Previously reported moderate to severe MR. Most recent echo in May 2014 revealed mild MR.  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 At least 3 subsequent echocardiograms have shown improved/normal EF with moderate to severe concentric LVH and diastolic dysfunction with LVOT/intracavitary gradient  . History of schizophrenia    However I am not sure about the validity of this. She is not on any medications.  .  Hyperlipidemia   . Hypertension   . Hypertensive hypertrophic cardiomyopathy: NYHA class II:  Echo: Severe concentric LVH with LV OT gradient; essentially preserved EF with diastolic dysfunction 02/15/2013   Not significantly symptomatic; echocardiogram from May 2014: Severe concentric LVH with EF 50-55%. Somewhat unbelievably grade 1 diastolic dysfunction with moderate LE dilation. Trivial MR noted.  . Left ventricular diastolic dysfunction, NYHA class 1 March 20   Severe concentric LVH (likely hypertensive), mild aortic sclerosis. Grade 1 diastolic dysfunction  . PAD (peripheral artery disease) [facility]) March 2013   Lower extremity Dopplers: R. SFA 50-60%, R. PTA proximally occluded with distal reconstitution;; L. common iliac ~50%, L. SFA 50-70% stenosis, L. PTA < 50%  . Schizophrenia [facility])     Past Surgical History:  Procedure Laterality Date  . BUNIONECTOMY    . CARDIAC CATHETERIZATION  04/09/2009   rt and lt cath -non obstructive coronary disease   . carotid doppler  05/29/2011   left bulb/prox ICA moderate amtfibrous plaque with no evidence significant reduction.,right bulb /proximal ICA normal patency  . DOPPLER ECHOCARDIOGRAPHY  May 2014   EF 50-55%; severe concentric LVH; only grade 1 diastolic dysfunction. Mild aortic sclerosis - with LVOT /intracavitary gradient of roughly 20 mmHg mean. Mild to moderately dilated LA;; previously reported MR not seen  . DOPPLER ECHOCARDIOGRAPHY  02/02/2009   mod to severe regurg mitral, EF 20 TO 25%, tricuspid severe regurg. ,pulmonic moderate regurg.  . lower extremity doppler  05/29/2011   right SFA 50% to 59% diameter reduction,right posterior tibal atreery occlusive disease,reconstituting distally, left common illiac<50%,left SFA 50 to70%,left post. tibial <50%  . NM MYOCAR PERF WALL MOTION  03/2009   Persantine; EF 51%-both anterior and inferolateral ischemia    Family History  Problem Relation Age of Onset  . Hypertension Mother      Social History  Substance Use Topics  . Smoking status: Former Smoker    Quit date: 05/14/2002  . Smokeless tobacco: Never Used  . Alcohol use No    Allergies: No Known Allergies  Prescriptions Prior to Admission  Medication Sig Dispense Refill Last Dose  . amLODipine (NORVASC) 10 MG tablet Take 10 mg by mouth daily.   03/10/2016 at Unknown time  . aspirin EC 81 MG tablet Take 81 mg by mouth daily.   03/10/2016 at Unknown time  . atorvastatin (LIPITOR) 40 MG tablet Take 1 tablet (40 mg total) by mouth daily. 90 tablet 3 03/10/2016 at Unknown time  . carvedilol (COREG) 25 MG tablet Take 1 tablet (25 mg total) by mouth 2 (two) times daily with a meal. 60 tablet 9 03/10/2016 at 1000  . dicyclomine (BENTYL) 20 MG tablet Take 1 tablet (20 mg total) by mouth 2 (two) times daily. 20 tablet 0   . digoxin (LANOXIN) 0.125 MG tablet Take 1 tablet (0.125 mg total) by mouth daily. 30 tablet 6 03/10/2016 at Unknown time  . gabapentin (NEURONTIN) 300 MG capsule Take 1 capsule (300 mg total) by mouth 2 (two) times daily. 14 capsule 0 03/10/2016 at Unknown time  . hydrALAZINE (APRESOLINE) 50 MG tablet take 1 and 1/2 tablets by mouth three times a day 270 tablet 3 03/10/2016 at Unknown time  . isosorbide mononitrate (IMDUR) 60 MG 24 hr tablet Take 1.5 tablets (90 mg total) by mouth daily. 45 tablet 6 03/10/2016 at Unknown time  . methocarbamol (ROBAXIN) 500 MG tablet Take 1 tablet (500 mg total) by mouth 3 (three) times daily as needed for muscle spasms (back spasm). 60 tablet 1 03/10/2016 at Unknown time  . ondansetron (ZOFRAN) 4 MG tablet Take 1 tablet (4 mg total) by mouth every 6 (six) hours. 12 tablet 0   . polyethylene glycol (MIRALAX / GLYCOLAX) packet Take 17 g by mouth daily. 14 each 0 03/10/2016 at Unknown time  . senna-docusate (SENOKOT-S) 8.6-50 MG tablet Take 1 tablet by mouth daily. 30 tablet 0 03/10/2016 at Unknown time  . spironolactone (ALDACTONE) 25 MG tablet Take 1 tablet (25 mg total) by mouth daily. 90  tablet 3 03/10/2016 at Unknown time  . sucralfate (CARAFATE) 1 G tablet Take 1 g by mouth 3 (three) times daily.    03/10/2016 at Unknown time  . Witch Hazel (TUCKS) 50 % PADS As directed (Patient not taking: Reported on 03/10/2016) 40 each 0 Not Taking at Unknown time    Review of Systems  Constitutional: Negative for chills and fever.  Gastrointestinal: Positive for abdominal pain and constipation (last BM about 15 mins PTA. ).  Genitourinary: Positive for dysuria. Negative for vaginal bleeding and vaginal discharge.   Physical Exam   Blood pressure (!) 181/110, pulse 107, temperature 98.8 F (37.1 C), temperature source Oral, resp. rate 20, height 5\' 8"  (1.727 m), weight 238 lb 4 oz (108.1 kg), last menstrual period 05/10/2013.  Physical Exam  Nursing note and vitals reviewed. Constitutional: She is oriented to person, place, and time. She appears well-developed and well-nourished. No distress.  HENT:  Head: Normocephalic.  Respiratory: Effort normal.  GI: Soft. There is no tenderness. There is no rebound.  Neurological: She is alert and oriented to person, place, and time.  Skin: Skin is warm.  Psychiatric: She expresses impulsivity and inappropriate judgment.   MAU Course  Procedures  MDM Advised patient that she is not pregnant. She is asking for a [place] to confirm. Advised patient that with negative UPT and her age that there is no indication to perform an [place] to verify pregnancy. She is agreeable. She states that she is only here to find out if she is pregnant.   Assessment and Plan   1. Delusion of pregnancy    DC home Comfort measures reviewed  RX: none  Return to MAU as needed   Follow-up Information    Billee Cashing, MD Follow up.   Specialty:  Family Medicine Contact information: [address] [place] [address] [phone]            Tawnya Crook 03/13/2016, 11:45 PM

## 2016-03-13 NOTE — MAU Note (Addendum)
PT SAYS SHE IS HAVING  [facility]  -  STARTED  AT  10PM-       GETS [facility]-  DR MCKENZIE  .  HAS  BEEN VOIDING  A LOT

## 2016-03-13 NOTE — Discharge Instructions (Signed)

## 2016-03-14 DIAGNOSIS — F22 Delusional disorders: Secondary | ICD-10-CM | POA: Diagnosis not present

## 2016-04-23 ENCOUNTER — Other Ambulatory Visit: Payer: Self-pay | Admitting: Cardiology

## 2016-04-25 NOTE — Telephone Encounter (Signed)
Rx(s) sent to pharmacy electronically.  

## 2016-05-09 ENCOUNTER — Encounter: Payer: Self-pay | Admitting: Cardiology

## 2016-05-09 ENCOUNTER — Ambulatory Visit (INDEPENDENT_AMBULATORY_CARE_PROVIDER_SITE_OTHER): Payer: Medicare HMO | Admitting: Cardiology

## 2016-05-09 VITALS — BP 122/58 | HR 81 | Ht 68.0 in | Wt 245.0 lb

## 2016-05-09 DIAGNOSIS — I358 Other nonrheumatic aortic valve disorders: Secondary | ICD-10-CM

## 2016-05-09 DIAGNOSIS — E785 Hyperlipidemia, unspecified: Secondary | ICD-10-CM

## 2016-05-09 DIAGNOSIS — I8393 Asymptomatic varicose veins of bilateral lower extremities: Secondary | ICD-10-CM

## 2016-05-09 DIAGNOSIS — I422 Other hypertrophic cardiomyopathy: Secondary | ICD-10-CM | POA: Diagnosis not present

## 2016-05-09 DIAGNOSIS — I10 Essential (primary) hypertension: Secondary | ICD-10-CM | POA: Diagnosis not present

## 2016-05-09 DIAGNOSIS — I340 Nonrheumatic mitral (valve) insufficiency: Secondary | ICD-10-CM | POA: Diagnosis not present

## 2016-05-09 DIAGNOSIS — I119 Hypertensive heart disease without heart failure: Secondary | ICD-10-CM | POA: Diagnosis not present

## 2016-05-09 DIAGNOSIS — I739 Peripheral vascular disease, unspecified: Secondary | ICD-10-CM | POA: Diagnosis not present

## 2016-05-09 NOTE — Patient Instructions (Signed)
SCHEDULE AT [address] Your physician has requested that you have an echocardiogram. Echocardiography is a painless test that uses sound waves to create images of your heart. It provides your doctor with information about the size and shape of your heart and how well your heart's chambers and valves are working. This procedure takes approximately one hour. There are no restrictions for this procedure.   STOP TAKING - DIGOXIN (DIGATEK)   BLOOD PRESSURE IS GOOD.   KEEP WALKING, AND USE SUPPORT STOCKING OR SOCKS ( MAY PURCHASE AT PHARMACY OR STORE LIKE [facility] , [employer], [employer],   DRINK PLENTY OF WATER - 8-10 GLASSES     Your physician wants you to follow-up in 12 MONTHS WITH DR HARDING. You will receive a reminder letter in the mail two months in advance. If you don't receive a letter, please call our office to schedule the follow-up appointment.   If you need a refill on your cardiac medications before your next appointment, please call your pharmacy.

## 2016-05-09 NOTE — Progress Notes (Signed)
PCP: Billee Cashing, MD  Clinic Note: Chief Complaint  Patient presents with  . Follow-up    has some leg and ankle swelling sometimes, has tiredness sometimes. no other complaints.  . Hypertension    Hypertensive heart disease with mild valve disease    HPI: Erin Riley is a 66 y.o. female with a PMH below who presents today for annual follow-up for moderate CAD, hypertension, cardiomyopathy and PAD. She had an abnormal Myoview in 2011 followed by cardiac catheterization revealing nonobstructive disease.  Erin Riley was last seen on 06/15/2015. Was doing well at that time. Only noted some pain in her left knee. Therefore gained weight. This was for a preop evaluation - for back surgery   Recent Hospitalizations: Onalee Hua a generalized abdominal pain in January 2018  Studies Reviewed: Nothing since echo in 2014  Interval History: Erin Riley arrived today borderline late for her visit. This is a very difficult visit as she is a very poor historian, is very tangential speech. She will sometimes just nowhere start giggling when discussing symptoms. She would not directly answer questions. As far as I can tell, she is able to get out and about and walk without too much difficulty, but does not do routine exercise. Her major complaint today is what looks like nodular varicose veins in her lower extremities and some bruising. No real edema is suggested. She denies any resting or exertional chest tightness or pressure, but does get short of breath if she walks fast. 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 She says she is occasionally dizzy with positions or turning her head a certain direction. She says that occasionally she feels her heart skipping a beat nothing to suggest a rapid arrhythmia. She has a headache but denies any syncope/near-syncope or TIA/amaurosis fugax symptoms.   As a result of the  difficult interview, this visit lasted close to 30 minutes. Greater than 50% of the time was simply trying to obtain history and explain recommendations.  ROS: A comprehensive was performed. Review of Systems  Unable to perform ROS: Mental acuity  Constitutional: Positive for malaise/fatigue (Mostly lack of motivation).  Respiratory: Positive for cough (Occasional).   Cardiovascular:       Per history of present illness  Gastrointestinal: Positive for constipation (Doesn't routinely take her bowel regimen). Negative for blood in stool and melena.  Musculoskeletal: Positive for back pain and joint pain.       Her knee and back pain and neuropathy limits her activity  Skin:       Mild nodular varicosities in both legs.  Neurological: Positive for dizziness and headaches.       Neuropathic pain down mostly her left side.  Endo/Heme/Allergies: Bruises/bleeds easily (On her legs).  Psychiatric/Behavioral:       Very poor historian.  All other systems reviewed and are negative.   Past Medical History:  Diagnosis Date  . Chronic back pain   . Chronic kidney disease (CKD), stage II (mild)    Class I-II  . Coronary artery disease 04/2009   50% stenosis in the perforator of LAD; catheterization was for an abnormal Myoview in January 2000 showing anterior and inferolateral ischemia.  [name] Heart murmur    Previously reported moderate to severe MR. Most recent echo in May 2014 revealed mild MR.  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 At least 3 subsequent echocardiograms have shown improved/normal EF with moderate to severe concentric LVH and diastolic dysfunction with LVOT/intracavitary gradient  . History of schizophrenia    However I am not sure about the validity of this. She is not on any medications.  . Hyperlipidemia   . Hypertension   . Hypertensive  hypertrophic cardiomyopathy: NYHA class II:  Echo: Severe concentric LVH with LV OT gradient; essentially preserved EF with diastolic dysfunction 02/15/2013   Not significantly symptomatic; echocardiogram from May 2014: Severe concentric LVH with EF 50-55%. Somewhat unbelievably grade 1 diastolic dysfunction with moderate LE dilation. Trivial MR noted.  . Left ventricular diastolic dysfunction, NYHA class 1 March 20   Severe concentric LVH (likely hypertensive), mild aortic sclerosis. Grade 1 diastolic dysfunction  . PAD (peripheral artery disease) [facility]) March 2013   Lower extremity Dopplers: R. SFA 50-60%, R. PTA proximally occluded with distal reconstitution;; L. common iliac ~50%, L. SFA 50-70% stenosis, L. PTA < 50%  . Schizophrenia [facility])     Past Surgical History:  Procedure Laterality Date  . BUNIONECTOMY    . CARDIAC CATHETERIZATION  04/09/2009   rt and lt cath -non obstructive coronary disease   . carotid doppler  05/29/2011   left bulb/prox ICA moderate amtfibrous plaque with no evidence significant reduction.,right bulb /proximal ICA normal patency  . DOPPLER ECHOCARDIOGRAPHY  May 2014   EF 50-55%; severe concentric LVH; only grade 1 diastolic dysfunction. Mild aortic sclerosis - with LVOT /intracavitary gradient of roughly 20 mmHg mean. Mild to moderately dilated LA;; previously reported MR not seen  . DOPPLER ECHOCARDIOGRAPHY  02/02/2009   mod to severe regurg mitral, EF 20 TO 25%, tricuspid severe regurg. ,pulmonic moderate regurg.  . lower extremity doppler  05/29/2011   right SFA 50% to 59% diameter reduction,right posterior tibal atreery occlusive disease,reconstituting distally, left common illiac<50%,left SFA 50 to70%,left post. tibial <50%  . NM MYOCAR PERF WALL MOTION  03/2009   Persantine; EF 51%-both anterior and inferolateral ischemia    Current Meds  Medication Sig  . aspirin EC 81 MG tablet Take 81 mg by mouth daily.  [name] atorvastatin (LIPITOR) 40 MG tablet Take 1  tablet (40 mg total) by mouth daily.  . carvedilol (COREG) 25 MG tablet Take 1 tablet (25 mg total) by mouth 2 (two) times daily with a meal.  . dicyclomine (BENTYL) 20 MG tablet Take 1 tablet (20 mg total) by mouth 2 (two) times daily.  . hydrALAZINE (APRESOLINE) 50 MG tablet take 1 and 1/2 tablets by mouth three times a day  . isosorbide mononitrate (IMDUR) 60 MG 24 hr tablet Take 1.5 tablets (90 mg total) by mouth daily.  . Multiple Vitamins-Minerals (STRESS B COMPLEX/ZINC PO) Take 1 tablet by mouth daily.  . polyethylene glycol (MIRALAX / GLYCOLAX) packet Take 17 g by mouth daily.  [name] senna-docusate (SENOKOT-S) 8.6-50 MG tablet Take 1 tablet by mouth daily.  [name] spironolactone (ALDACTONE) 25 MG tablet Take 1 tablet (25 mg total) by mouth daily.  . sucralfate (CARAFATE) 1 G tablet Take 1 g by mouth 3 (three) times daily.   Weyman Croon Hazel (TUCKS) 50 % PADS As directed  . [DISCONTINUED] digoxin (DIGITEK) 0.125 MG tablet Take 1 tablet (0.125 mg total) by mouth daily.    No Known Allergies  Social History   Social History  . Marital status: Single    Spouse name: N/A  . Number of children:  N/A  . Years of education: N/A   Social History Main Topics  . Smoking status: Former Smoker    Quit date: 05/14/2002  . Smokeless tobacco: Never Used  . Alcohol use No  . Drug use: No  . Sexual activity: Not Asked   Other Topics Concern  . None   Social History Narrative   Now single mother of 2 with one grandchild. She quit smoking roughly 5 years ago and is not so since. She has also stopped drinking alcohol. She does try get routine exercise walking at least a mile 3-4 days a week.    She lives with her 65 year old mother. 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    family history includes Hypertension in her mother.  Wt Readings from Last 3 Encounters:  05/09/16 111.1 kg (245 lb)  03/13/16 108.1 kg (238 lb 4 oz)  03/10/16 108.9 kg (240 lb)    PHYSICAL EXAM BP (!) 122/58   Pulse 81   Ht  5\' 8"  (1.727 m)   Wt 111.1 kg (245 lb)   LMP 05/10/2013   SpO2 97%   BMI 37.25 kg/m  General appearance: alert, cooperative, appears stated age, no distress, mild-moderately obese. She is well-groomed and well-nourished. She is in a good mood with normal affect  Neck: Supple, no LAN or JVD Lungs: CTAB, normal percussion bilaterally and Nonlabored, good air movement  Heart: prominent apical impulse, RRR with nl S1&S2, S4 present and 2/6 c-d SEM at RUSB --> carotids, soft 1/6 HSM --> axilla.  Abdomen: soft, non-tender; bowel sounds normal; no masses, no organomegaly  Extremities: extremities normal, atraumatic, no cyanosis or edema; She does have stiffness and pain in the left knee. Mild swelling.She does have nodular liver post veins on both lower 70s with minimal edema. They are somewhat tender to touch. Pulses: Trace to 1+ R PT, 1+ LPT; otherwise 2+ pulses throughout  Skin: No obvious rashes or lesions  Neurologic: Mental status: Alert, oriented, thought content appropriate - inappropriate laughing.  Very tangential - hard to keep on topic.     Adult ECG Report None  Other studies Reviewed: Additional studies/ records that were reviewed today include:  Recent Labs:  Followed by PCP Lab Results  Component Value Date   CHOL 134 06/15/2014   HDL 51 06/15/2014   LDLCALC 67 06/15/2014   TRIG 82 06/15/2014   CHOLHDL 2.6 06/15/2014   Lab Results  Component Value Date   CREATININE 0.88 03/10/2016   BUN 17 03/10/2016   NA 135 03/10/2016   K 4.1 03/10/2016   CL 103 03/10/2016   CO2 25 03/10/2016    ASSESSMENT / PLAN: Problem List Items Addressed This Visit    Essential hypertension (Chronic)    Low pressure looks much better today. Interestingly, it appears that her amlodipine was discontinued.  Continue carvedilol and hydralazine/Imdur at current doses. Continue spironolactone      Relevant Orders   ECHOCARDIOGRAM COMPLETE   Heart murmur, aortic   Relevant Orders     ECHOCARDIOGRAM COMPLETE   Hyperlipidemia with target LDL less than 100 (Chronic)    She is on simvastatin. Labs followed presumably by her PCP. I have not seen recent labs, but by last check that I see she was relatively well-controlled.      Hypertensive hypertrophic cardiomyopathy: NYHA class II:  Echo: Severe concentric LVH with LV OT gradient; essentially preserved EF with diastolic dysfunction - Primary (Chronic)    No obvious CHF symptoms. Some exertional dyspnea, but no  real PND and orthopnea. Minimal edema. On max dose carvedilol. She is on hydralazine/Imdur. - No longer on amlodipine, but blood pressure looks relatively well-controlled.  She is not currently on a diuretic other than spironolactone. Relatively euvolemic today. No requirement for Lasix.  - As she has vigorous LV function, I think we can discontinue digoxin to avoid potential toxicities. - She is not on an ACE inhibitor or ARB (likely related to renal insufficiency in the past. Most recent creatinine was normal however. For the sake of avoiding effusion, I will simply continue the hydralazine/nitrate combination, but we may want to consider converting to an ARB for afterload reduction.      Relevant Orders   ECHOCARDIOGRAM COMPLETE   Nonrheumatic mitral valve regurgitation    Previously reported as severe in the setting of dilated cardiomyopathy.  Has not had a recheck. We will order echocardiogram to evaluate her aortic valve murmur as well as mitral valve.[name]      PAD (peripheral artery disease) (HCC) (Chronic)    No real claudication symptoms. We can recheck Dopplers if if her symptoms worsen - but otherwise would probably recheck next year.      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 My recommendation is continue walking into use support stockings. If symptoms continue to get worse, we can consider referral for the vein  and vascular evaluation.         Current medicines are reviewed at length with the patient today. (+/- concerns) None The following changes have been made: See below  Patient Instructions  SCHEDULE AT [address] Your physician has requested that you have an echocardiogram. Echocardiography is a painless test that uses sound waves to create images of your heart. It provides your doctor with information about the size and shape of your heart and how well your heart's chambers and valves are working. This procedure takes approximately one hour. There are no restrictions for this procedure.   STOP TAKING - DIGOXIN (DIGATEK)   BLOOD PRESSURE IS GOOD.   KEEP WALKING, AND USE SUPPORT STOCKING OR SOCKS ( MAY PURCHASE AT PHARMACY OR STORE LIKE [facility] , [employer], [employer],   DRINK PLENTY OF WATER - 8-10 GLASSES     Your physician wants you to follow-up in 12 MONTHS WITH DR Daisia Slomski. You will receive a reminder letter in the mail two months in advance. If you don't receive a letter, please call our office to schedule the follow-up appointment.   If you need a refill on your cardiac medications before your next appointment, please call your pharmacy.      Studies Ordered:   Orders Placed This Encounter  Procedures  . ECHOCARDIOGRAM COMPLETE      Bryan Lemma, M.D., M.S. Interventional Cardiologist   Pager # [phone] Phone # [phone] [address] [place], [place] [address]

## 2016-05-10 ENCOUNTER — Encounter: Payer: Self-pay | Admitting: Cardiology

## 2016-05-10 DIAGNOSIS — I8393 Asymptomatic varicose veins of bilateral lower extremities: Secondary | ICD-10-CM | POA: Insufficient documentation

## 2016-05-10 NOTE — Assessment & Plan Note (Signed)
Low pressure looks much better today. Interestingly, it appears that her amlodipine was discontinued.  Continue carvedilol and hydralazine/Imdur at current doses. Continue spironolactone

## 2016-05-10 NOTE — Assessment & Plan Note (Addendum)
Previously reported as severe in the setting of dilated cardiomyopathy.  Has not had a recheck. We will order echocardiogram to evaluate her aortic valve murmur as well as mitral valve..[name]

## 2016-05-10 NOTE — Assessment & Plan Note (Signed)
I she probably has some venous insufficiency. My recommendation is continue walking into use support stockings. If symptoms continue to get worse, we can consider referral for the vein and vascular evaluation.

## 2016-05-10 NOTE — Assessment & Plan Note (Signed)
No obvious CHF symptoms. Some exertional dyspnea, but no real PND and orthopnea. Minimal edema. On max dose carvedilol. She is on hydralazine/Imdur. - No longer on amlodipine, but blood pressure looks relatively well-controlled.  She is not currently on a diuretic other than spironolactone. Relatively euvolemic today. No requirement for Lasix.  - As she has vigorous LV function, I think we can discontinue digoxin to avoid potential toxicities. - She is not on an ACE inhibitor or ARB (likely related to renal insufficiency in the past. Most recent creatinine was normal however. For the sake of avoiding effusion, I will simply continue the hydralazine/nitrate combination, but we may want to consider converting to an ARB for afterload reduction.

## 2016-05-10 NOTE — Assessment & Plan Note (Signed)
She is on simvastatin. Labs followed presumably by her PCP. I have not seen recent labs, but by last check that I see she was relatively well-controlled.

## 2016-05-10 NOTE — Assessment & Plan Note (Addendum)
No real claudication symptoms. We can recheck Dopplers if if her symptoms worsen - but otherwise would probably recheck next year.

## 2016-05-25 ENCOUNTER — Ambulatory Visit (HOSPITAL_COMMUNITY): Payer: Medicare HMO | Attending: Cardiovascular Disease

## 2016-06-04 HISTORY — PX: TRANSTHORACIC ECHOCARDIOGRAM: SHX275

## 2016-06-08 ENCOUNTER — Emergency Department (HOSPITAL_COMMUNITY)
Admission: EM | Admit: 2016-06-08 | Discharge: 2016-06-09 | Disposition: A | Discharge status: Other Acute Inpatient Hospital | Payer: Medicare HMO | Attending: Emergency Medicine | Admitting: Emergency Medicine

## 2016-06-08 ENCOUNTER — Encounter (HOSPITAL_COMMUNITY): Payer: Self-pay | Admitting: Emergency Medicine

## 2016-06-08 DIAGNOSIS — I2510 Atherosclerotic heart disease of native coronary artery without angina pectoris: Secondary | ICD-10-CM | POA: Diagnosis not present

## 2016-06-08 DIAGNOSIS — I129 Hypertensive chronic kidney disease with stage 1 through stage 4 chronic kidney disease, or unspecified chronic kidney disease: Secondary | ICD-10-CM | POA: Diagnosis not present

## 2016-06-08 DIAGNOSIS — R443 Hallucinations, unspecified: Secondary | ICD-10-CM | POA: Diagnosis present

## 2016-06-08 DIAGNOSIS — F209 Schizophrenia, unspecified: Secondary | ICD-10-CM | POA: Diagnosis not present

## 2016-06-08 DIAGNOSIS — Z9114 Patient's other noncompliance with medication regimen: Secondary | ICD-10-CM

## 2016-06-08 DIAGNOSIS — N183 Chronic kidney disease, stage 3 (moderate): Secondary | ICD-10-CM | POA: Insufficient documentation

## 2016-06-08 DIAGNOSIS — Z87891 Personal history of nicotine dependence: Secondary | ICD-10-CM | POA: Diagnosis not present

## 2016-06-08 DIAGNOSIS — Z79899 Other long term (current) drug therapy: Secondary | ICD-10-CM | POA: Diagnosis not present

## 2016-06-08 DIAGNOSIS — Z7982 Long term (current) use of aspirin: Secondary | ICD-10-CM | POA: Diagnosis not present

## 2016-06-08 DIAGNOSIS — I10 Essential (primary) hypertension: Secondary | ICD-10-CM

## 2016-06-08 LAB — COMPREHENSIVE METABOLIC PANEL
ALBUMIN: 3.8 g/dL (ref 3.5–5.0)
ALT: 18 U/L (ref 14–54)
AST: 23 U/L (ref 15–41)
Alkaline Phosphatase: 68 U/L (ref 38–126)
Anion gap: 7 (ref 5–15)
BUN: 17 mg/dL (ref 6–20)
CHLORIDE: 104 mmol/L (ref 101–111)
CO2: 25 mmol/L (ref 22–32)
CREATININE: 1.23 mg/dL — AB (ref 0.44–1.00)
Calcium: 9.5 mg/dL (ref 8.9–10.3)
GFR calc Af Amer: 52 mL/min — ABNORMAL LOW (ref >60)
GFR calc non Af Amer: 45 mL/min — ABNORMAL LOW (ref >60)
GLUCOSE: 99 mg/dL (ref 65–99)
POTASSIUM: 3.8 mmol/L (ref 3.5–5.1)
Sodium: 136 mmol/L (ref 135–145)
Total Bilirubin: 0.7 mg/dL (ref 0.3–1.2)
Total Protein: 7.4 g/dL (ref 6.5–8.1)

## 2016-06-08 LAB — RAPID URINE DRUG SCREEN, HOSP PERFORMED
Amphetamines: NOT DETECTED
BARBITURATES: NOT DETECTED
Benzodiazepines: NOT DETECTED
COCAINE: NOT DETECTED
Opiates: NOT DETECTED
TETRAHYDROCANNABINOL: NOT DETECTED

## 2016-06-08 LAB — CBC
HEMATOCRIT: 38.8 % (ref 36.0–46.0)
HEMOGLOBIN: 12.6 g/dL (ref 12.0–15.0)
MCH: 30.5 pg (ref 26.0–34.0)
MCHC: 32.5 g/dL (ref 30.0–36.0)
MCV: 93.9 fL (ref 78.0–100.0)
Platelets: 302 10*3/uL (ref 150–400)
RBC: 4.13 MIL/uL (ref 3.87–5.11)
RDW: 12.9 % (ref 11.5–15.5)
WBC: 6.9 10*3/uL (ref 4.0–10.5)

## 2016-06-08 LAB — ETHANOL

## 2016-06-08 NOTE — ED Notes (Signed)
Staffing called for sitter. Placed in [place] scrubs. Security notified to wand pt.

## 2016-06-08 NOTE — ED Notes (Signed)
Wanded at this time

## 2016-06-08 NOTE — ED Triage Notes (Signed)
Pt arrives with [employer], pt was walk in at [facility] but they noted HTN at 200 systolic. Pt IVC'd by sister for hallucinations, off her meds one year. Hx schizoprenia.
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
## 2016-06-09 DIAGNOSIS — F209 Schizophrenia, unspecified: Secondary | ICD-10-CM | POA: Diagnosis not present

## 2016-06-09 LAB — LIPID PANEL
CHOLESTEROL: 171 mg/dL (ref 0–200)
HDL: 36 mg/dL — ABNORMAL LOW (ref >40)
LDL Cholesterol: 80 mg/dL (ref 0–99)
Total CHOL/HDL Ratio: 4.8 RATIO
Triglycerides: 274 mg/dL — ABNORMAL HIGH (ref <150)
VLDL: 55 mg/dL — ABNORMAL HIGH (ref 0–40)

## 2016-06-09 LAB — SALICYLATE LEVEL: Salicylate Lvl: <7 mg/dL (ref 2.8–30.0)

## 2016-06-09 LAB — ACETAMINOPHEN LEVEL

## 2016-06-09 LAB — TSH: TSH: 0.589 u[IU]/mL (ref 0.350–4.500)

## 2016-06-09 MED ORDER — ASPIRIN EC 81 MG PO TBEC
81.0000 mg | DELAYED_RELEASE_TABLET | Freq: Every day | ORAL | Status: DC
  Administered 2016-06-09: 81 mg via ORAL
  Filled 2016-06-09: qty 1

## 2016-06-09 MED ORDER — SPIRONOLACTONE 25 MG PO TABS
25.0000 mg | ORAL_TABLET | Freq: Every day | ORAL | Status: DC
  Administered 2016-06-09: 25 mg via ORAL
  Filled 2016-06-09: qty 1

## 2016-06-09 MED ORDER — SODIUM CHLORIDE 0.9 % IV BOLUS (SEPSIS)
1000.0000 mL | Freq: Once | INTRAVENOUS | Status: AC
  Administered 2016-06-09: 1000 mL via INTRAVENOUS

## 2016-06-09 MED ORDER — HALOPERIDOL 2 MG PO TABS: 2.0000 mg | ORAL_TABLET | Freq: Two times a day (BID) | ORAL | Status: DC | PRN

## 2016-06-09 MED ORDER — HYDRALAZINE HCL 25 MG PO TABS
100.0000 mg | ORAL_TABLET | Freq: Once | ORAL | Status: AC
  Administered 2016-06-09: 100 mg via ORAL
  Filled 2016-06-09: qty 4

## 2016-06-09 MED ORDER — HYDRALAZINE HCL 25 MG PO TABS
75.0000 mg | ORAL_TABLET | Freq: Three times a day (TID) | ORAL | Status: DC
  Administered 2016-06-09: 75 mg via ORAL
  Filled 2016-06-09 (×2): qty 3

## 2016-06-09 MED ORDER — ACETAMINOPHEN 325 MG PO TABS
650.0000 mg | ORAL_TABLET | ORAL | Status: DC | PRN
  Administered 2016-06-09: 650 mg via ORAL
  Filled 2016-06-09: qty 2

## 2016-06-09 MED ORDER — ATORVASTATIN CALCIUM 40 MG PO TABS
40.0000 mg | ORAL_TABLET | Freq: Every day | ORAL | Status: DC
  Administered 2016-06-09: 40 mg via ORAL
  Filled 2016-06-09: qty 1

## 2016-06-09 MED ORDER — LORAZEPAM 1 MG PO TABS
1.0000 mg | ORAL_TABLET | Freq: Three times a day (TID) | ORAL | Status: DC | PRN
  Filled 2016-06-09: qty 1

## 2016-06-09 MED ORDER — HALOPERIDOL LACTATE 5 MG/ML IJ SOLN: 5.0000 mg | Freq: Two times a day (BID) | INTRAMUSCULAR | Status: DC | PRN

## 2016-06-09 MED ORDER — ISOSORBIDE MONONITRATE ER 30 MG PO TB24
90.0000 mg | ORAL_TABLET | Freq: Every day | ORAL | Status: DC
  Administered 2016-06-09: 90 mg via ORAL
  Filled 2016-06-09: qty 3

## 2016-06-09 MED ORDER — IBUPROFEN 400 MG PO TABS
600.0000 mg | ORAL_TABLET | Freq: Three times a day (TID) | ORAL | Status: DC | PRN
  Administered 2016-06-09: 600 mg via ORAL
  Filled 2016-06-09: qty 1

## 2016-06-09 MED ORDER — ZOLPIDEM TARTRATE 5 MG PO TABS: 5.0000 mg | ORAL_TABLET | Freq: Every evening | ORAL | Status: DC | PRN

## 2016-06-09 MED ORDER — CARVEDILOL 12.5 MG PO TABS
25.0000 mg | ORAL_TABLET | ORAL | Status: AC
  Administered 2016-06-09: 25 mg via ORAL
  Filled 2016-06-09: qty 2

## 2016-06-09 MED ORDER — ALUM & MAG HYDROXIDE-SIMETH 200-200-20 MG/5ML PO SUSP: 30.0000 mL | ORAL | Status: DC | PRN

## 2016-06-09 MED ORDER — CARVEDILOL 12.5 MG PO TABS
25.0000 mg | ORAL_TABLET | Freq: Two times a day (BID) | ORAL | Status: DC
  Administered 2016-06-09: 25 mg via ORAL
  Filled 2016-06-09 (×2): qty 2

## 2016-06-09 MED ORDER — ONDANSETRON HCL 4 MG PO TABS: 4.0000 mg | ORAL_TABLET | Freq: Three times a day (TID) | ORAL | Status: DC | PRN

## 2016-06-09 NOTE — Progress Notes (Signed)
Pt meets criteria for inpatient admission; CSW faxed referral packet to the following facilities in attempts to secure inpatient bed:   [facility]  TTS will continue to seek placement.   Vernie Shanks, LCSW Clinical Social Work [phone]

## 2016-06-09 NOTE — ED Notes (Signed)
Breakfast order placed ?

## 2016-06-09 NOTE — ED Notes (Signed)
c/o h/a meds given

## 2016-06-09 NOTE — ED Notes (Signed)
[place], RN from [facility] contacted this RN stating that once patient was medically cleared they should be able to accept her back, [place] requesting update on patient after TTS assessment.

## 2016-06-09 NOTE — ED Provider Notes (Signed)
Received care of patient at 9AM.  Patient with schizophrenia, off of medications, responding to internal stimuli, concern she is danger to self. Completed assessment for continuing IVC.  She was initially very hypertensive at [facility], sent here for concern for BP. Blood pressure medications started yesterday and today blood pressures are decreased to 90s systolic. Patient denies vaginal bleeding, black or bloody stools, nausea, vomiting, diarrhea, fevers, cough, urinary symptoms or other infectious symptoms. Suspect most likely cause of her decreased blood pressures today is the blood pressure medications. Question whether patient has been taking these at all at home, whether doses rx are appropriate for her.  Given IV fluids with improvement of blood pressures. Feel she is stable for transfer to psychiatric facility.   Alvira Monday, MD 06/10/16 [phone]

## 2016-06-09 NOTE — ED Notes (Signed)
IVC paperwork faxed to [facility] at [facility]

## 2016-06-09 NOTE — Progress Notes (Signed)
Per Amy at [facility], Pt has been accepted to their facility to [facility]; accepting provider is Margarita Rana MD. Please call report to [phone].   RN Aram Beecham aware. Pt is IVC'd. [place] [employer] to trasnport.    Vernie Shanks, LCSW Clinical Social Work [phone]

## 2016-06-09 NOTE — ED Notes (Signed)
Breakfast tray at bedside 

## 2016-06-09 NOTE — ED Notes (Signed)
IVC paperwork given to pod A secretary to fax to [facility]

## 2016-06-09 NOTE — ED Notes (Addendum)
This RN spoke with marcus at [facility] in regards to receipt of IVC paperwork, Berna Spare advised they did not receive fax of IVC paperwork yet. This RN advised we will attempt to fax papers again.

## 2016-06-09 NOTE — ED Notes (Signed)
TTS assessment in process  

## 2016-06-09 NOTE — BH Assessment (Signed)
Tele Assessment Note   Erin Riley is an 66 y.o. female.  -Clinician reviewed note by Dr. Blinda Leatherwood.  Pt is a 66 y.o. female with hx of schizophrenia who presents to the Emergency Department [facility] for a mental evaluation onset x1 day. Per triage, pt was IVC'd by sister for hallucinations. According to IVC paperwork Pt is "a danger to self and other, TO WIT: Diagnosed with schizophrenia and has bene off medication for a year. Pt is 66 years old and thinks that she is pregnant. Sister believes that pt is hearing voices because she talks for hours and laughs inappropriately." Pt states "I'm fine, I just want a sandwich and to go to sleep" when asked a series of questions. She denies any modifying factors. Pt denies audible hallucinations, suicidal/homicidal ideation.  Patient does not wish to answer questions.  She asked why clinician doesn't ask various relatives of her about how they feel instead of how she feels.  She moved the teleassessment machine so that she did not have to see clinician.  When asked if she believed herself to be pregnant she says that "the blood test will tell the truth."  Patient then starts asking if clinician is still having sex and whether wife is pregnant.  Patient then said she would see if blood test showed anything.  Patient denies any SI, HI or A/V hallucinations.  Patient talks almost non-stop to herself.  She finds it difficult to focus on questions being asked.  Patient does not answer questions with simple answers.  In between questions she will be talking to herself.    Patient is unable to say whether she has a psychiatrist now.  It is presumed that she does not.  Patient has had inpatient care in the past.  -Patient care discussed with Nira Conn, FNP who recommends inpatient psychiatric care for stabilization.  Diagnosis: Schizophrenia  Past Medical History:  Past Medical History:  Diagnosis Date  . Chronic back pain   . Chronic kidney disease (CKD), stage  II (mild)    Class I-II  . Coronary artery disease 04/2009   50% stenosis in the perforator of LAD; catheterization was for an abnormal Myoview in January 2000 showing anterior and inferolateral ischemia.  [name] Heart murmur    Previously reported moderate to severe MR. Most recent echo in May 2014 revealed mild MR.  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 At least 3 subsequent echocardiograms have shown improved/normal EF with moderate to severe concentric LVH and diastolic dysfunction with LVOT/intracavitary gradient  . History of schizophrenia    However I am not sure about the validity of this. She is not on any medications.  . Hyperlipidemia   . Hypertension   . Hypertensive hypertrophic cardiomyopathy: NYHA class II:  Echo: Severe concentric LVH with LV OT gradient; essentially preserved EF with diastolic dysfunction 02/15/2013   Not significantly symptomatic; echocardiogram from May 2014: Severe concentric LVH with EF 50-55%. Somewhat unbelievably grade 1 diastolic dysfunction with moderate LE dilation. Trivial MR noted.  . Left ventricular diastolic dysfunction, NYHA class 1 March 20   Severe concentric LVH (likely hypertensive), mild aortic sclerosis. Grade 1 diastolic dysfunction  . PAD (peripheral artery disease) [facility] March 2013   Lower extremity Dopplers: R. SFA 50-60%, R. PTA proximally occluded with distal reconstitution;; L. common iliac ~50%, L. SFA 50-70% stenosis, L. PTA < 50%  .  Schizophrenia [facility])     Past Surgical History:  Procedure Laterality Date  . BUNIONECTOMY    . CARDIAC CATHETERIZATION  04/09/2009   rt and lt cath -non obstructive coronary disease   . carotid doppler  05/29/2011   left bulb/prox ICA moderate amtfibrous plaque with no evidence significant reduction.,right bulb /proximal ICA normal patency  . DOPPLER ECHOCARDIOGRAPHY  May  2014   EF 50-55%; severe concentric LVH; only grade 1 diastolic dysfunction. Mild aortic sclerosis - with LVOT /intracavitary gradient of roughly 20 mmHg mean. Mild to moderately dilated LA;; previously reported MR not seen  . DOPPLER ECHOCARDIOGRAPHY  02/02/2009   mod to severe regurg mitral, EF 20 TO 25%, tricuspid severe regurg. ,pulmonic moderate regurg.  . lower extremity doppler  05/29/2011   right SFA 50% to 59% diameter reduction,right posterior tibal atreery occlusive disease,reconstituting distally, left common illiac<50%,left SFA 50 to70%,left post. tibial <50%  . NM MYOCAR PERF WALL MOTION  03/2009   Persantine; EF 51%-both anterior and inferolateral ischemia    Family History:  Family History  Problem Relation Age of Onset  . Hypertension Mother     Social History:  reports that she quit smoking about 14 years ago. She has never used smokeless tobacco. She reports that she does not drink alcohol or use drugs.  Additional Social History:  Alcohol / Drug Use Pain Medications: PTA medication list Prescriptions: See PTA medications list Over the Counter: See PTA medication list History of alcohol / drug use?: No history of alcohol / drug abuse  CIWA: CIWA-Ar BP: (!) 158/88 Pulse Rate: 70 Nausea and Vomiting: no nausea and no vomiting Tactile Disturbances: none Tremor: no tremor Auditory Disturbances: not present Paroxysmal Sweats: no sweat visible Visual Disturbances: not present Anxiety: no anxiety, at ease Headache, Fullness in Head: none present Agitation: normal activity Orientation and Clouding of Sensorium: oriented and can do serial additions CIWA-Ar Total: 0 COWS:    PATIENT STRENGTHS: (choose at least two) Average or above average intelligence Communication skills  Allergies: No Known Allergies  Home Medications:  (Not in a hospital admission)  OB/GYN Status:  Patient's last menstrual period was 05/10/2013.  General Assessment Data Location of  Assessment: [facility] ED TTS Assessment: In system Is this a Tele or Face-to-Face Assessment?: Tele Assessment Is this an Initial Assessment or a Re-assessment for this encounter?: Initial Assessment Marital status: Single Is patient pregnant?: No Pregnancy Status: No Living Arrangements: Children Can pt return to current living arrangement?: Yes Admission Status: Involuntary Is patient capable of signing voluntary admission?: No Referral Source: Self/Family/Friend (Sister took out IVC papers.) Insurance type: [employer] MCR / MCD     Crisis Care Plan Living Arrangements: Children Name of Psychiatrist: None Name of Therapist: None  Education Status Is patient currently in school?: No Highest grade of school patient has completed: "Far enough"  Risk to self with the past 6 months Suicidal Ideation: No Has patient been a risk to self within the past 6 months prior to admission? : No Suicidal Intent: No Has patient had any suicidal intent within the past 6 months prior to admission? : No Is patient at risk for suicide?: No Suicidal Plan?: No Has patient had any suicidal plan within the past 6 months prior to admission? : No Access to Means: No What has been your use of drugs/alcohol within the last 12 months?: Pt denies Previous Attempts/Gestures: No How many times?: 0 Other Self Harm Risks: None Triggers for Past Attempts: None known Intentional Self Injurious  Behavior: None Family Suicide History: No Recent stressful life event(s): Other (Comment) (Pt denies any recent stressors.) Persecutory voices/beliefs?: Yes Depression: No Depression Symptoms:  (Pt denies.) Substance abuse history and/or treatment for substance abuse?: No Suicide prevention information given to non-admitted patients: Not applicable  Risk to Others within the past 6 months Homicidal Ideation: No Does patient have any lifetime risk of violence toward others beyond the six months prior to admission? :  No Thoughts of Harm to Others: No Current Homicidal Intent: No Current Homicidal Plan: No Access to Homicidal Means: No Identified Victim: No one History of harm to others?: No Assessment of Violence: None Noted Violent Behavior Description: None reported Does patient have access to weapons?: No Criminal Charges Pending?: No Does patient have a court date: No Is patient on probation?: No  Psychosis Hallucinations: Auditory Delusions: Somatic (Pt believes that she may be pregnant)  Mental Status Report Appearance/Hygiene: Disheveled, In scrubs Eye Contact: Poor Motor Activity: Freedom of movement, Unremarkable Speech: Incoherent Level of Consciousness: Alert, Irritable Mood: Anxious Affect: Preoccupied Anxiety Level: Minimal Thought Processes: Irrelevant, Flight of Ideas Judgement: Unimpaired Orientation: Appropriate for developmental age Obsessive Compulsive Thoughts/Behaviors: Moderate  Cognitive Functioning Concentration: Decreased Memory: Recent Impaired, Remote Impaired IQ: Average Insight: Poor Impulse Control: Poor Appetite: Good Weight Loss: 0 Weight Gain: 0 Sleep: Decreased Total Hours of Sleep:  (<6H/D) Vegetative Symptoms: Staying in bed  ADLScreening [facility] Assessment Services) Patient's cognitive ability adequate to safely complete daily activities?: Yes  Prior Inpatient Therapy Prior Inpatient Therapy: Yes Prior Therapy Dates: Unknown Prior Therapy Facilty/Provider(s): Unknown Reason for Treatment: Unknown  Prior Outpatient Therapy Prior Outpatient Therapy: Yes Prior Therapy Dates: Unknown Prior Therapy Facilty/Provider(s): Unknown Reason for Treatment: Unknown Does patient have an ACCT team?: No Does patient have Intensive In-House Services?  : No Does patient have [facility] services? : No Does patient have P4CC services?: No  ADL Screening (condition at time of admission) Patient's cognitive ability adequate to safely complete daily  activities?: Yes Is the patient deaf or have difficulty hearing?: No Does the patient have difficulty seeing, even when wearing glasses/contacts?: No Does the patient have difficulty concentrating, remembering, or making decisions?: Yes Does the patient have difficulty dressing or bathing?: No Does the patient have difficulty walking or climbing stairs?: No Weakness of Legs: None Weakness of Arms/Hands: None       Abuse/Neglect Assessment (Assessment to be complete while patient is alone) Physical Abuse: Denies Verbal Abuse: Denies Sexual Abuse: Denies Exploitation of patient/patient's resources: Denies Self-Neglect: Denies     Merchant navy officer (For Healthcare) Does Patient Have a Medical Advance Directive?: No Would patient like information on creating a medical advance directive?: Yes (ED - Information included in AVS)    Additional Information 1:1 In Past 12 Months?: No CIRT Risk: No Elopement Risk: No Does patient have medical clearance?: Yes     Disposition:  Disposition Initial Assessment Completed for this Encounter: Yes Disposition of Patient: Other dispositions Other disposition(s): Other (Comment) (Pt to be reviewed with FNP)  Beatriz Stallion Ray 06/09/2016 4:53 AM

## 2016-06-09 NOTE — ED Notes (Signed)
DR Dalene Seltzer into see pt

## 2016-06-09 NOTE — ED Notes (Signed)
Spoke to [facility]  New labs drawn , await geri bed

## 2016-06-09 NOTE — ED Notes (Signed)
Marcus at TTS mad aware machine is now available for him to assess patient.

## 2016-06-10 LAB — PROLACTIN: Prolactin: 16.5 ng/mL (ref 4.8–23.3)

## 2016-06-10 LAB — HEMOGLOBIN A1C
Hgb A1c MFr Bld: 5.9 % — ABNORMAL HIGH (ref 4.8–5.6)
Mean Plasma Glucose: 123 mg/dL

## 2016-06-14 ENCOUNTER — Other Ambulatory Visit (HOSPITAL_COMMUNITY): Payer: Medicare Other

## 2016-06-16 ENCOUNTER — Other Ambulatory Visit: Payer: Self-pay | Admitting: Cardiology

## 2016-06-16 NOTE — Telephone Encounter (Signed)
REFILL 

## 2016-07-03 ENCOUNTER — Other Ambulatory Visit: Payer: Self-pay

## 2016-07-03 ENCOUNTER — Ambulatory Visit (HOSPITAL_COMMUNITY): Payer: Medicare HMO | Attending: Cardiology

## 2016-07-03 DIAGNOSIS — I358 Other nonrheumatic aortic valve disorders: Secondary | ICD-10-CM | POA: Diagnosis not present

## 2016-07-03 DIAGNOSIS — I422 Other hypertrophic cardiomyopathy: Secondary | ICD-10-CM | POA: Diagnosis present

## 2016-07-03 DIAGNOSIS — I340 Nonrheumatic mitral (valve) insufficiency: Secondary | ICD-10-CM

## 2016-07-03 DIAGNOSIS — I10 Essential (primary) hypertension: Secondary | ICD-10-CM | POA: Diagnosis not present

## 2016-07-03 DIAGNOSIS — I119 Hypertensive heart disease without heart failure: Secondary | ICD-10-CM | POA: Diagnosis not present

## 2016-07-03 DIAGNOSIS — I348 Other nonrheumatic mitral valve disorders: Secondary | ICD-10-CM | POA: Diagnosis not present

## 2016-07-03 DIAGNOSIS — I350 Nonrheumatic aortic (valve) stenosis: Secondary | ICD-10-CM | POA: Insufficient documentation

## 2016-07-03 DIAGNOSIS — I517 Cardiomegaly: Secondary | ICD-10-CM | POA: Insufficient documentation

## 2016-07-03 DIAGNOSIS — I5030 Unspecified diastolic (congestive) heart failure: Secondary | ICD-10-CM | POA: Insufficient documentation

## 2016-07-07 ENCOUNTER — Telehealth: Payer: Self-pay | Admitting: *Deleted

## 2016-07-07 NOTE — Telephone Encounter (Signed)
unable to leave message - on voicemail not set up

## 2016-07-07 NOTE — Telephone Encounter (Signed)
-----   Message from Marykay Lexavid W Harding, MD sent at 07/04/2016 11:51 PM EDT ----- Relatively stable echocardiogram results. As previously described, severe LVH/thickening of the heart ventricle muscle. Vigorous contraction however with supernormal ejection fraction of 60 07/14/1968 percent. Normal wall motion. Again grade 1 diastolic dysfunction does not correlate with this.  Overall essentially stable findings. Murmur is likely caused by aortic calcification with maybe mild aortic stenosis. No significant change.  Bryan Lemmaavid Harding, MD

## 2016-07-10 ENCOUNTER — Telehealth: Payer: Self-pay

## 2016-07-10 NOTE — Telephone Encounter (Signed)
Patient walked in office wanting handi capp forms she brought last week.Dr.Harding signed form for a handi capp parking placard.Form was given to patient.Advised she will have to PCP sign form for handi c[employer] license tag.Patient complaining of lower abd pain.Stated she has had pain for years but worse this past weekend.Advised she needs to see PCP or go to Urgent care,ED.

## 2016-07-14 ENCOUNTER — Emergency Department (HOSPITAL_COMMUNITY): Payer: Medicare HMO

## 2016-07-14 ENCOUNTER — Encounter (HOSPITAL_COMMUNITY): Payer: Self-pay

## 2016-07-14 ENCOUNTER — Emergency Department (HOSPITAL_COMMUNITY)
Admission: EM | Admit: 2016-07-14 | Discharge: 2016-07-14 | Disposition: A | Discharge status: Home / Self Care | Payer: Medicare HMO | Attending: Emergency Medicine | Admitting: Emergency Medicine

## 2016-07-14 DIAGNOSIS — R079 Chest pain, unspecified: Secondary | ICD-10-CM | POA: Diagnosis not present

## 2016-07-14 DIAGNOSIS — Z87891 Personal history of nicotine dependence: Secondary | ICD-10-CM | POA: Insufficient documentation

## 2016-07-14 DIAGNOSIS — N183 Chronic kidney disease, stage 3 (moderate): Secondary | ICD-10-CM | POA: Insufficient documentation

## 2016-07-14 DIAGNOSIS — R4182 Altered mental status, unspecified: Secondary | ICD-10-CM | POA: Diagnosis present

## 2016-07-14 DIAGNOSIS — Z7982 Long term (current) use of aspirin: Secondary | ICD-10-CM | POA: Insufficient documentation

## 2016-07-14 DIAGNOSIS — I130 Hypertensive heart and chronic kidney disease with heart failure and stage 1 through stage 4 chronic kidney disease, or unspecified chronic kidney disease: Secondary | ICD-10-CM | POA: Insufficient documentation

## 2016-07-14 DIAGNOSIS — I2510 Atherosclerotic heart disease of native coronary artery without angina pectoris: Secondary | ICD-10-CM | POA: Insufficient documentation

## 2016-07-14 DIAGNOSIS — I509 Heart failure, unspecified: Secondary | ICD-10-CM | POA: Diagnosis not present

## 2016-07-14 DIAGNOSIS — Z79899 Other long term (current) drug therapy: Secondary | ICD-10-CM | POA: Diagnosis not present

## 2016-07-14 LAB — COMPREHENSIVE METABOLIC PANEL
ALT: 18 U/L (ref 14–54)
AST: 22 U/L (ref 15–41)
Albumin: 3.7 g/dL (ref 3.5–5.0)
Alkaline Phosphatase: 70 U/L (ref 38–126)
Anion gap: 7 (ref 5–15)
BUN: 16 mg/dL (ref 6–20)
CHLORIDE: 105 mmol/L (ref 101–111)
CO2: 23 mmol/L (ref 22–32)
CREATININE: 1.2 mg/dL — AB (ref 0.44–1.00)
Calcium: 9.8 mg/dL (ref 8.9–10.3)
GFR calc non Af Amer: 46 mL/min — ABNORMAL LOW (ref >60)
GFR, EST AFRICAN AMERICAN: 54 mL/min — AB (ref >60)
Glucose, Bld: 107 mg/dL — ABNORMAL HIGH (ref 65–99)
Potassium: 4.1 mmol/L (ref 3.5–5.1)
Sodium: 135 mmol/L (ref 135–145)
Total Bilirubin: 0.4 mg/dL (ref 0.3–1.2)
Total Protein: 7.5 g/dL (ref 6.5–8.1)

## 2016-07-14 LAB — CBC
HCT: 39.1 % (ref 36.0–46.0)
Hemoglobin: 12.7 g/dL (ref 12.0–15.0)
MCH: 30.8 pg (ref 26.0–34.0)
MCHC: 32.5 g/dL (ref 30.0–36.0)
MCV: 94.9 fL (ref 78.0–100.0)
Platelets: 264 10*3/uL (ref 150–400)
RBC: 4.12 MIL/uL (ref 3.87–5.11)
RDW: 13.5 % (ref 11.5–15.5)
WBC: 6.6 10*3/uL (ref 4.0–10.5)

## 2016-07-14 LAB — ACETAMINOPHEN LEVEL

## 2016-07-14 LAB — CBG MONITORING, ED: Glucose-Capillary: 96 mg/dL (ref 65–99)

## 2016-07-14 LAB — I-STAT TROPONIN, ED: TROPONIN I, POC: 0.05 ng/mL (ref 0.00–0.08)

## 2016-07-14 LAB — SALICYLATE LEVEL

## 2016-07-14 LAB — ETHANOL: Alcohol, Ethyl (B): <5 mg/dL (ref <5)

## 2016-07-14 MED ORDER — HYDRALAZINE HCL 50 MG PO TABS
50.0000 mg | ORAL_TABLET | Freq: Once | ORAL | Status: AC
  Administered 2016-07-14: 50 mg via ORAL
  Filled 2016-07-14: qty 1

## 2016-07-14 MED ORDER — ISOSORBIDE MONONITRATE ER 30 MG PO TB24: 90.0000 mg | ORAL_TABLET | Freq: Every day | ORAL | Status: DC

## 2016-07-14 MED ORDER — CARVEDILOL 12.5 MG PO TABS: 25.0000 mg | ORAL_TABLET | Freq: Two times a day (BID) | ORAL | Status: DC

## 2016-07-14 NOTE — ED Triage Notes (Signed)
Pt initially presented to nurse first and was very confused. P making conversation and appeared to have hallucinations. Once back in triage she expressed chest pain. Pt alert and oriented to my questions in triage.

## 2016-07-14 NOTE — ED Notes (Signed)
This writer attempting to ask pt about history and she stated "hush baby I just trying to watch the news".

## 2016-07-14 NOTE — ED Notes (Signed)
ED Provider at bedside. 

## 2016-07-14 NOTE — Discharge Instructions (Signed)
Follow up with your doctor about your chest pain, Come back to the ED if you change your mind about your workup.

## 2016-07-14 NOTE — ED Notes (Signed)
Pt cursing at staff stating she is ready to go, refusing to wait on paperwork or sign.

## 2016-07-14 NOTE — ED Notes (Signed)
Pt stating she is ready to go, MD made aware.

## 2016-07-14 NOTE — ED Notes (Signed)
Pt asking "Do ya'll have any children here?" When asked what she meant by this she stated "Do you hear anything? I just wanted to be checked."

## 2016-07-14 NOTE — ED Notes (Signed)
Pt laughing on the way back from triage looking at a magazine mumbling under her breath about "she got the same blood". Pt laughing in room when asked what brought her to the hospital stating "I was little nauseous." Pt denies vomiting.

## 2016-07-14 NOTE — BH Assessment (Signed)
This clinician spoke with patients nurse who reports the TTS consult has been pulled for now.

## 2016-07-14 NOTE — ED Notes (Signed)
Portable xray at bedside.
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
## 2016-07-19 ENCOUNTER — Telehealth: Payer: Self-pay

## 2016-07-19 NOTE — Telephone Encounter (Signed)
Patient walked in office complaining of pain in left upper side and pain in lower abdomen.Patient requesting to be seen by Dr.Rheman.Advised Dr.Rheman not in this office.Patient requesting his phone number.Dr.Rheman's phone number given to patient.

## 2016-07-30 ENCOUNTER — Emergency Department (HOSPITAL_COMMUNITY): Payer: Medicare HMO

## 2016-07-30 ENCOUNTER — Encounter (HOSPITAL_COMMUNITY): Payer: Self-pay

## 2016-07-30 ENCOUNTER — Emergency Department (HOSPITAL_COMMUNITY)
Admission: EM | Admit: 2016-07-30 | Discharge: 2016-07-30 | Disposition: A | Discharge status: Home / Self Care | Payer: Medicare HMO | Attending: Emergency Medicine | Admitting: Emergency Medicine

## 2016-07-30 DIAGNOSIS — N183 Chronic kidney disease, stage 3 (moderate): Secondary | ICD-10-CM | POA: Diagnosis not present

## 2016-07-30 DIAGNOSIS — I129 Hypertensive chronic kidney disease with stage 1 through stage 4 chronic kidney disease, or unspecified chronic kidney disease: Secondary | ICD-10-CM | POA: Diagnosis not present

## 2016-07-30 DIAGNOSIS — I2510 Atherosclerotic heart disease of native coronary artery without angina pectoris: Secondary | ICD-10-CM | POA: Diagnosis not present

## 2016-07-30 DIAGNOSIS — R1032 Left lower quadrant pain: Secondary | ICD-10-CM | POA: Diagnosis present

## 2016-07-30 DIAGNOSIS — Z87891 Personal history of nicotine dependence: Secondary | ICD-10-CM | POA: Insufficient documentation

## 2016-07-30 DIAGNOSIS — Z79899 Other long term (current) drug therapy: Secondary | ICD-10-CM | POA: Insufficient documentation

## 2016-07-30 DIAGNOSIS — R109 Unspecified abdominal pain: Secondary | ICD-10-CM

## 2016-07-30 LAB — I-STAT CHEM 8, ED
BUN: 19 mg/dL (ref 6–20)
CALCIUM ION: 1.19 mmol/L (ref 1.15–1.40)
Chloride: 105 mmol/L (ref 101–111)
Creatinine, Ser: 1.3 mg/dL — ABNORMAL HIGH (ref 0.44–1.00)
Glucose, Bld: 103 mg/dL — ABNORMAL HIGH (ref 65–99)
HCT: 40 % (ref 36.0–46.0)
Hemoglobin: 13.6 g/dL (ref 12.0–15.0)
Potassium: 3.6 mmol/L (ref 3.5–5.1)
SODIUM: 138 mmol/L (ref 135–145)
TCO2: 25 mmol/L (ref 0–100)

## 2016-07-30 LAB — URINALYSIS, ROUTINE W REFLEX MICROSCOPIC
BACTERIA UA: NONE SEEN
BILIRUBIN URINE: NEGATIVE
Glucose, UA: NEGATIVE mg/dL
HGB URINE DIPSTICK: NEGATIVE
Ketones, ur: NEGATIVE mg/dL
NITRITE: NEGATIVE
PH: 5 (ref 5.0–8.0)
Protein, ur: 30 mg/dL — AB
SPECIFIC GRAVITY, URINE: 1.014 (ref 1.005–1.030)

## 2016-07-30 LAB — CBC WITH DIFFERENTIAL/PLATELET
Basophils Absolute: 0 10*3/uL (ref 0.0–0.1)
Basophils Relative: 0 %
EOS ABS: 0.2 10*3/uL (ref 0.0–0.7)
EOS PCT: 3 %
HCT: 39.2 % (ref 36.0–46.0)
Hemoglobin: 13.2 g/dL (ref 12.0–15.0)
LYMPHS ABS: 3 10*3/uL (ref 0.7–4.0)
Lymphocytes Relative: 46 %
MCH: 31.3 pg (ref 26.0–34.0)
MCHC: 33.7 g/dL (ref 30.0–36.0)
MCV: 92.9 fL (ref 78.0–100.0)
MONO ABS: 0.3 10*3/uL (ref 0.1–1.0)
MONOS PCT: 5 %
Neutro Abs: 3 10*3/uL (ref 1.7–7.7)
Neutrophils Relative %: 46 %
PLATELETS: 285 10*3/uL (ref 150–400)
RBC: 4.22 MIL/uL (ref 3.87–5.11)
RDW: 12.9 % (ref 11.5–15.5)
WBC: 6.5 10*3/uL (ref 4.0–10.5)

## 2016-07-30 MED ORDER — DICLOFENAC SODIUM 1 % TD GEL: 4.0000 g | Freq: Four times a day (QID) | TRANSDERMAL | 0 refills | Status: DC

## 2016-07-30 NOTE — ED Triage Notes (Signed)
Pt complaining of L flank pain. Pt denies any N/V/D. Pt states some vaginal discharge. Pt also complaining of some dysuria. Pt denies any abdominal pain.
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
## 2016-07-30 NOTE — ED Notes (Signed)
Patient transported to CT 

## 2016-08-14 ENCOUNTER — Encounter (HOSPITAL_COMMUNITY): Payer: Self-pay | Admitting: Emergency Medicine

## 2016-08-14 ENCOUNTER — Emergency Department (HOSPITAL_COMMUNITY): Payer: Medicare HMO

## 2016-08-14 ENCOUNTER — Emergency Department (HOSPITAL_COMMUNITY)
Admission: EM | Admit: 2016-08-14 | Discharge: 2016-08-15 | Disposition: A | Discharge status: Other Acute Inpatient Hospital | Payer: Medicare HMO | Attending: Emergency Medicine | Admitting: Emergency Medicine

## 2016-08-14 DIAGNOSIS — R45850 Homicidal ideations: Secondary | ICD-10-CM | POA: Diagnosis not present

## 2016-08-14 DIAGNOSIS — N183 Chronic kidney disease, stage 3 (moderate): Secondary | ICD-10-CM | POA: Diagnosis not present

## 2016-08-14 DIAGNOSIS — I2510 Atherosclerotic heart disease of native coronary artery without angina pectoris: Secondary | ICD-10-CM | POA: Insufficient documentation

## 2016-08-14 DIAGNOSIS — Z049 Encounter for examination and observation for unspecified reason: Secondary | ICD-10-CM

## 2016-08-14 DIAGNOSIS — F200 Paranoid schizophrenia: Secondary | ICD-10-CM | POA: Diagnosis present

## 2016-08-14 DIAGNOSIS — F419 Anxiety disorder, unspecified: Secondary | ICD-10-CM | POA: Diagnosis not present

## 2016-08-14 DIAGNOSIS — I129 Hypertensive chronic kidney disease with stage 1 through stage 4 chronic kidney disease, or unspecified chronic kidney disease: Secondary | ICD-10-CM | POA: Insufficient documentation

## 2016-08-14 DIAGNOSIS — Z79899 Other long term (current) drug therapy: Secondary | ICD-10-CM | POA: Insufficient documentation

## 2016-08-14 DIAGNOSIS — Z7982 Long term (current) use of aspirin: Secondary | ICD-10-CM | POA: Diagnosis not present

## 2016-08-14 DIAGNOSIS — Z87891 Personal history of nicotine dependence: Secondary | ICD-10-CM | POA: Diagnosis not present

## 2016-08-14 DIAGNOSIS — F209 Schizophrenia, unspecified: Secondary | ICD-10-CM

## 2016-08-14 DIAGNOSIS — R451 Restlessness and agitation: Secondary | ICD-10-CM | POA: Diagnosis not present

## 2016-08-14 DIAGNOSIS — R443 Hallucinations, unspecified: Secondary | ICD-10-CM | POA: Diagnosis present

## 2016-08-14 DIAGNOSIS — I10 Essential (primary) hypertension: Secondary | ICD-10-CM

## 2016-08-14 LAB — COMPREHENSIVE METABOLIC PANEL
ALT: 19 U/L (ref 14–54)
ANION GAP: 6 (ref 5–15)
AST: 21 U/L (ref 15–41)
Albumin: 3.9 g/dL (ref 3.5–5.0)
Alkaline Phosphatase: 71 U/L (ref 38–126)
BILIRUBIN TOTAL: 0.3 mg/dL (ref 0.3–1.2)
BUN: 25 mg/dL — AB (ref 6–20)
CO2: 24 mmol/L (ref 22–32)
Calcium: 9.7 mg/dL (ref 8.9–10.3)
Chloride: 107 mmol/L (ref 101–111)
Creatinine, Ser: 1.19 mg/dL — ABNORMAL HIGH (ref 0.44–1.00)
GFR calc Af Amer: 54 mL/min — ABNORMAL LOW (ref >60)
GFR, EST NON AFRICAN AMERICAN: 47 mL/min — AB (ref >60)
Glucose, Bld: 102 mg/dL — ABNORMAL HIGH (ref 65–99)
POTASSIUM: 4.4 mmol/L (ref 3.5–5.1)
Sodium: 137 mmol/L (ref 135–145)
TOTAL PROTEIN: 7.7 g/dL (ref 6.5–8.1)

## 2016-08-14 LAB — URINALYSIS, ROUTINE W REFLEX MICROSCOPIC
Bilirubin Urine: NEGATIVE
Glucose, UA: NEGATIVE mg/dL
Hgb urine dipstick: NEGATIVE
KETONES UR: NEGATIVE mg/dL
LEUKOCYTES UA: NEGATIVE
NITRITE: NEGATIVE
PH: 6 (ref 5.0–8.0)
PROTEIN: NEGATIVE mg/dL
Specific Gravity, Urine: 1.015 (ref 1.005–1.030)

## 2016-08-14 LAB — RAPID URINE DRUG SCREEN, HOSP PERFORMED
Amphetamines: NOT DETECTED
BENZODIAZEPINES: NOT DETECTED
Barbiturates: NOT DETECTED
Cocaine: NOT DETECTED
Opiates: NOT DETECTED
Tetrahydrocannabinol: NOT DETECTED

## 2016-08-14 LAB — CBC WITH DIFFERENTIAL/PLATELET
Basophils Absolute: 0 10*3/uL (ref 0.0–0.1)
Basophils Relative: 0 %
EOS PCT: 2 %
Eosinophils Absolute: 0.2 10*3/uL (ref 0.0–0.7)
HEMATOCRIT: 41.3 % (ref 36.0–46.0)
HEMOGLOBIN: 13.6 g/dL (ref 12.0–15.0)
LYMPHS ABS: 2.8 10*3/uL (ref 0.7–4.0)
Lymphocytes Relative: 41 %
MCH: 30.7 pg (ref 26.0–34.0)
MCHC: 32.9 g/dL (ref 30.0–36.0)
MCV: 93.2 fL (ref 78.0–100.0)
Monocytes Absolute: 0.3 10*3/uL (ref 0.1–1.0)
Monocytes Relative: 5 %
NEUTROS ABS: 3.6 10*3/uL (ref 1.7–7.7)
NEUTROS PCT: 52 %
Platelets: 309 10*3/uL (ref 150–400)
RBC: 4.43 MIL/uL (ref 3.87–5.11)
RDW: 13.1 % (ref 11.5–15.5)
WBC: 6.9 10*3/uL (ref 4.0–10.5)

## 2016-08-14 LAB — ETHANOL

## 2016-08-14 MED ORDER — ISOSORBIDE MONONITRATE ER 60 MG PO TB24
60.0000 mg | ORAL_TABLET | Freq: Every day | ORAL | Status: DC
  Administered 2016-08-14 – 2016-08-15 (×2): 60 mg via ORAL
  Filled 2016-08-14 (×2): qty 1

## 2016-08-14 MED ORDER — SPIRONOLACTONE 25 MG PO TABS
25.0000 mg | ORAL_TABLET | Freq: Every day | ORAL | Status: DC
  Administered 2016-08-14 – 2016-08-15 (×2): 25 mg via ORAL
  Filled 2016-08-14 (×2): qty 1

## 2016-08-14 MED ORDER — CARVEDILOL 25 MG PO TABS
25.0000 mg | ORAL_TABLET | Freq: Two times a day (BID) | ORAL | Status: DC
  Administered 2016-08-14 – 2016-08-15 (×2): 25 mg via ORAL
  Filled 2016-08-14 (×5): qty 1

## 2016-08-14 MED ORDER — ATORVASTATIN CALCIUM 40 MG PO TABS
40.0000 mg | ORAL_TABLET | Freq: Every day | ORAL | Status: DC
  Administered 2016-08-14 – 2016-08-15 (×2): 40 mg via ORAL
  Filled 2016-08-14 (×2): qty 1

## 2016-08-14 MED ORDER — ASPIRIN EC 81 MG PO TBEC
81.0000 mg | DELAYED_RELEASE_TABLET | Freq: Every day | ORAL | Status: DC
Start: 2016-08-14 — End: 2016-08-15
  Administered 2016-08-14 – 2016-08-15 (×2): 81 mg via ORAL
  Filled 2016-08-14 (×2): qty 1

## 2016-08-14 MED ORDER — SUCRALFATE 1 G PO TABS
1.0000 g | ORAL_TABLET | Freq: Three times a day (TID) | ORAL | Status: DC
  Administered 2016-08-14 – 2016-08-15 (×3): 1 g via ORAL
  Filled 2016-08-14 (×5): qty 1

## 2016-08-14 MED ORDER — HYDRALAZINE HCL 50 MG PO TABS
50.0000 mg | ORAL_TABLET | Freq: Three times a day (TID) | ORAL | Status: DC
  Administered 2016-08-14 – 2016-08-15 (×3): 50 mg via ORAL
  Filled 2016-08-14 (×4): qty 1

## 2016-08-14 NOTE — [facility] Assessment (Signed)
[facility] Assessment Progress Note   Case was staffed with Lord DNP who recommended patient be re-evaluated in the a.m.    

## 2016-08-14 NOTE — Progress Notes (Signed)
Pt ambulatory to [facility] with a steady gait, [facility]. Presents elated, observed talking to self with inappropriate laughter on initial approach. Pt A & O to self, place and time. Replied "I'm here because of my stomach" when asked about reasons leading to admission. [facility] related to being dangerous to self and others, responding to unheard voices and h/o medication noncompliance. Intermittent verbal outburst observed related to d/c demands and wanting to see her purse. Pt redirectable at this time. Tolerates PO intake well when offered. Q 15 minutes safety checks initiated without self harm gestures to note.
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
## 2016-08-14 NOTE — ED Notes (Signed)
Bed: [facility] Expected date:  Expected time:  Means of arrival:  Comments: 

## 2016-08-14 NOTE — ED Notes (Signed)
Pt resting at present, no distress noted, calm at present.  Monitoring for safety, Q 15 min checks in effect. 
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
## 2016-08-14 NOTE — ED Notes (Signed)
Bed: [facility] Expected date:  Expected time:  Means of arrival:  Comments: [employer]-IVC 
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
## 2016-08-15 DIAGNOSIS — F200 Paranoid schizophrenia: Secondary | ICD-10-CM | POA: Diagnosis not present

## 2016-08-15 DIAGNOSIS — R451 Restlessness and agitation: Secondary | ICD-10-CM

## 2016-08-15 DIAGNOSIS — Z79899 Other long term (current) drug therapy: Secondary | ICD-10-CM | POA: Diagnosis not present

## 2016-08-15 DIAGNOSIS — Z7982 Long term (current) use of aspirin: Secondary | ICD-10-CM | POA: Diagnosis not present

## 2016-08-15 DIAGNOSIS — F419 Anxiety disorder, unspecified: Secondary | ICD-10-CM

## 2016-08-15 DIAGNOSIS — F209 Schizophrenia, unspecified: Secondary | ICD-10-CM | POA: Diagnosis not present

## 2016-08-15 DIAGNOSIS — Z87891 Personal history of nicotine dependence: Secondary | ICD-10-CM | POA: Diagnosis not present

## 2016-08-15 DIAGNOSIS — R45850 Homicidal ideations: Secondary | ICD-10-CM

## 2016-08-15 MED ORDER — HALOPERIDOL 1 MG PO TABS
0.5000 mg | ORAL_TABLET | Freq: Two times a day (BID) | ORAL | Status: DC
  Administered 2016-08-15: 0.5 mg via ORAL
  Filled 2016-08-15: qty 1

## 2016-08-15 MED ORDER — HYDROXYZINE HCL 10 MG PO TABS
10.0000 mg | ORAL_TABLET | Freq: Two times a day (BID) | ORAL | Status: DC
  Administered 2016-08-15: 10 mg via ORAL
  Filled 2016-08-15: qty 1

## 2016-08-15 NOTE — ED Notes (Signed)
Patient demanding to go home.  Redirection necssary.  Compliant with po meds.  Support offered and 15' safety checks cont.

## 2016-08-15 NOTE — BH Assessment (Signed)
[facility] Assessment Progress Note  Per Thedore MinsMojeed Akintayo, MD, this pt requires psychiatric hospitalization at this time.  Pt presents under IVC initiated by her son, which Dr Jannifer FranklinAkintayo has upheld.  The following facilities have been contacted to seek placement for this pt, with results as noted:  Beds available, information sent, decision pending:  [facility] [place]   [facility]   [facility]oyl[facility] [place] Alexie Lanni, [place] Triage Specialist [phone]

## 2016-08-15 NOTE — ED Provider Notes (Signed)
BH team [facility] Dr Loann Quillh[facility].   Pt alert, content, nad.   Vitals:   08/14/16 2059 08/15/16 0652  BP: 108/64 131/72  Pulse: 72 77  Resp: 18 16  Temp: 98.7 F (37.1 C) 97.7 F (36.5 C)   Patient currently appears stable for transfer.      Cathren LaineSteinl, Taha Dimond, MD 08/15/16 [phone]

## 2016-08-15 NOTE — Progress Notes (Signed)
08/15/16 1403:  LRT was informed by staff to let pt remain sleep.  Caroll RancherMarjette Saesha Llerenas, LRT/CTRS

## 2016-08-15 NOTE — Discharge Instructions (Signed)
[facility] Dr Loann Quill[facility].
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
## 2016-08-15 NOTE — BH Assessment (Signed)
[facility] Assessment Progress Note  Per Thedore MinsMojeed Akintayo, MD, this pt requires psychiatric hospitalization at this time.  Pt presents under IVC initiated by her son, which Dr Jannifer FranklinAkintayo has upheld.  At 15:30 Britta MccreedyBarbara [facility] accepted to their facility by Dr Loann Quillhivukula to Rm 153-2 on the [facility].  EDP Cathren LaineKevin Steinl, MD concurs with this decision.  Pt's nurse, Carlisle BeersLuann, has been notified, and agrees to call report to [phone].  Pt must arrive either before 23:00 today or after 06:00 tomorrow.  [facility]   Doylene Canninghomas Caitlinn Klinker, MA Triage Specialist [phone]

## 2016-09-22 ENCOUNTER — Other Ambulatory Visit: Payer: Self-pay | Admitting: Cardiology

## 2016-09-28 ENCOUNTER — Inpatient Hospital Stay (HOSPITAL_COMMUNITY)
Admission: AD | Admit: 2016-09-28 | Discharge: 2016-09-28 | Disposition: A | Discharge status: Home / Self Care | Payer: Medicare HMO | Source: Ambulatory Visit | Attending: Obstetrics and Gynecology | Admitting: Obstetrics and Gynecology

## 2016-09-28 ENCOUNTER — Encounter (HOSPITAL_COMMUNITY): Payer: Self-pay | Admitting: *Deleted

## 2016-09-28 DIAGNOSIS — I739 Peripheral vascular disease, unspecified: Secondary | ICD-10-CM | POA: Diagnosis not present

## 2016-09-28 DIAGNOSIS — Z87891 Personal history of nicotine dependence: Secondary | ICD-10-CM | POA: Insufficient documentation

## 2016-09-28 DIAGNOSIS — Z79899 Other long term (current) drug therapy: Secondary | ICD-10-CM | POA: Insufficient documentation

## 2016-09-28 DIAGNOSIS — S80211A Abrasion, right knee, initial encounter: Secondary | ICD-10-CM | POA: Diagnosis not present

## 2016-09-28 DIAGNOSIS — I2510 Atherosclerotic heart disease of native coronary artery without angina pectoris: Secondary | ICD-10-CM | POA: Insufficient documentation

## 2016-09-28 DIAGNOSIS — I129 Hypertensive chronic kidney disease with stage 1 through stage 4 chronic kidney disease, or unspecified chronic kidney disease: Secondary | ICD-10-CM | POA: Diagnosis not present

## 2016-09-28 DIAGNOSIS — N182 Chronic kidney disease, stage 2 (mild): Secondary | ICD-10-CM | POA: Diagnosis not present

## 2016-09-28 DIAGNOSIS — N950 Postmenopausal bleeding: Secondary | ICD-10-CM | POA: Diagnosis not present

## 2016-09-28 DIAGNOSIS — W19XXXA Unspecified fall, initial encounter: Secondary | ICD-10-CM | POA: Diagnosis not present

## 2016-09-28 DIAGNOSIS — Z7982 Long term (current) use of aspirin: Secondary | ICD-10-CM | POA: Insufficient documentation

## 2016-09-28 DIAGNOSIS — E785 Hyperlipidemia, unspecified: Secondary | ICD-10-CM | POA: Insufficient documentation

## 2016-09-28 LAB — URINALYSIS, ROUTINE W REFLEX MICROSCOPIC
BILIRUBIN URINE: NEGATIVE
Glucose, UA: NEGATIVE mg/dL
HGB URINE DIPSTICK: NEGATIVE
Ketones, ur: NEGATIVE mg/dL
Leukocytes, UA: NEGATIVE
NITRITE: NEGATIVE
PROTEIN: NEGATIVE mg/dL
SPECIFIC GRAVITY, URINE: 1.01 (ref 1.005–1.030)
pH: 6 (ref 5.0–8.0)

## 2016-09-28 LAB — POCT PREGNANCY, URINE: PREG TEST UR: NEGATIVE

## 2016-09-28 NOTE — MAU Provider Note (Signed)
Chief Complaint: Fall  SUBJECTIVE HPI: Erin Riley is a 66 y.o. [id number] who presents to MAU reporting vaginal bleeding that has been on and off for several months. She presents to MAU from psychiatrist office with concerns of being pregnant. Patient concerned that she may be going through "the changes of life". States that she last bleed 4 weeks ago and it lasted 3 days. Denies ever having a period >7312months of no bleeding.  Has never been worked up for this. Denies bleeding currently. Denies abdominal pain. Last had unprotected intercourse about 4 weeks ago.   Also of note, patient mentions that she fell 2 days ago at the grocery store. She fell on her right knee. Was able to ambulate after. Did not seek medical care. Denies falling and hitting her head or losing consciousness.   Past Medical History:  Diagnosis Date  . Chronic back pain   . Chronic kidney disease (CKD), stage II (mild)    Class I-II  . Coronary artery disease 04/2009   50% stenosis in the perforator of LAD; catheterization was for an abnormal Myoview in January 2000 showing anterior and inferolateral ischemia.  [name]. Heart murmur    Previously reported moderate to severe MR. Most recent echo in May 2014 revealed mild MR.  [name]. 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 At least 3 subsequent echocardiograms have shown improved/normal EF with moderate to severe concentric LVH and diastolic dysfunction with LVOT/intracavitary gradient  . History of schizophrenia    However I am not sure about the validity of this. She is not on any medications.  . Hyperlipidemia   . Hypertension   . Hypertensive hypertrophic cardiomyopathy: NYHA class II:  Echo: Severe concentric LVH with LV OT gradient; essentially preserved EF with diastolic dysfunction 02/15/2013   Not significantly symptomatic; echocardiogram from May 2014:  Severe concentric LVH with EF 50-55%. Somewhat unbelievably grade 1 diastolic dysfunction with moderate LE dilation. Trivial MR noted.  . Left ventricular diastolic dysfunction, NYHA class 1 March 20   Severe concentric LVH (likely hypertensive), mild aortic sclerosis. Grade 1 diastolic dysfunction  . PAD (peripheral artery disease) [facility] March 2013   Lower extremity Dopplers: R. SFA 50-60%, R. PTA proximally occluded with distal reconstitution;; L. common iliac ~50%, L. SFA 50-70% stenosis, L. PTA < 50%  . Schizophrenia ([facility])    OB History  Gravida Para Term Preterm AB Living  3 3 3     3   SAB TAB Ectopic Multiple Live Births          3    # Outcome Date GA Lbr Len/2nd Weight Sex Delivery Anes PTL Lv  3 Term      Vag-Spont     2 Term      Vag-Spont     1 Term      Vag-Spont        Past Surgical History:  Procedure Laterality Date  . BUNIONECTOMY    . CARDIAC CATHETERIZATION  04/09/2009   rt and lt cath -non obstructive coronary disease   . carotid doppler  05/29/2011   left bulb/prox ICA moderate amtfibrous plaque with no evidence significant reduction.,right bulb /proximal ICA normal patency  . DOPPLER ECHOCARDIOGRAPHY  May 2014   EF 50-55%; severe concentric LVH; only grade 1 diastolic dysfunction. Mild aortic sclerosis - with LVOT /intracavitary gradient of roughly 20 mmHg mean. Mild to moderately dilated LA;;  previously reported MR not seen  . DOPPLER ECHOCARDIOGRAPHY  02/02/2009   mod to severe regurg mitral, EF 20 TO 25%, tricuspid severe regurg. ,pulmonic moderate regurg.  . lower extremity doppler  05/29/2011   right SFA 50% to 59% diameter reduction,right posterior tibal atreery occlusive disease,reconstituting distally, left common illiac<50%,left SFA 50 to70%,left post. tibial <50%  . NM MYOCAR PERF WALL MOTION  03/2009   Persantine; EF 51%-both anterior and inferolateral ischemia   Social History   Social History  . Marital status: Married    Spouse name: N/A  .  Number of children: N/A  . Years of education: N/A   Occupational History  . Not on file.   Social History Main Topics  . Smoking status: Former Smoker    Quit date: 05/14/2002  . Smokeless tobacco: Never Used  . Alcohol use No  . Drug use: No  . Sexual activity: Not on file   Other Topics Concern  . Not on file   Social History Narrative   Now single mother of 2 with one grandchild. She quit smoking roughly 5 years ago and is not so since. She has also stopped drinking alcohol. She does try get routine exercise walking at least a mile 3-4 days a week.    She lives with her 65 year old mother. 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   No current facility-administered medications on file prior to encounter.    Current Outpatient Prescriptions on File Prior to Encounter  Medication Sig Dispense Refill  . aspirin EC 81 MG tablet Take 81 mg by mouth daily.    [name]. atorvastatin (LIPITOR) 40 MG tablet Take 1 tablet (40 mg total) by mouth daily. 90 tablet 3  . carvedilol (COREG) 25 MG tablet take 1 tablet by mouth twice a day with meals 60 tablet 9  . diclofenac sodium (VOLTAREN) 1 % GEL Apply 4 g topically 4 (four) times daily. (Patient not taking: Reported on 08/14/2016) 100 g 0  . dicyclomine (BENTYL) 20 MG tablet Take 1 tablet (20 mg total) by mouth 2 (two) times daily. 20 tablet 0  . hydrALAZINE (APRESOLINE) 50 MG tablet take 1 and 1/2 tablets by mouth three times a day 270 tablet 3  . isosorbide mononitrate (IMDUR) 60 MG 24 hr tablet Take 1.5 tablets (90 mg total) by mouth daily. 45 tablet 2  . Multiple Vitamins-Minerals (STRESS B COMPLEX/ZINC PO) Take 1 tablet by mouth daily.    . polyethylene glycol (MIRALAX / GLYCOLAX) packet Take 17 g by mouth daily. 14 each 0  . senna-docusate (SENOKOT-S) 8.6-50 MG tablet Take 1 tablet by mouth daily. 30 tablet 0  . spironolactone (ALDACTONE) 25 MG tablet Take 1 tablet (25 mg total) by mouth daily. 90 tablet 3  . sucralfate (CARAFATE) 1 G tablet Take 1  g by mouth 3 (three) times daily.     Weyman Croon. Witch Hazel (TUCKS) 50 % PADS As directed 40 each 0   No Known Allergies  I have reviewed the past Medical Hx, Surgical Hx, Social Hx, Allergies and Medications.   REVIEW OF SYSTEMS All systems reviewed and are negative for acute change except as noted in the HPI.  OBJECTIVE Patient Vitals for the past 24 hrs:  BP Temp Pulse Resp Height Weight  09/28/16 1024 (!) 147/89 - 84 18 - -  09/28/16 0947 (!) 188/122 98.5 F (36.9 C) 90 18 5' 8.31" (1.735 m) 258 lb 1.9 oz (117.1 kg)    PHYSICAL EXAM Constitutional: Well-developed, well-nourished female in  no acute distress. Tardative dyskinesia appreciated.  Cardiovascular: normal rate Respiratory: normal rate and effort.  GI: Abd soft, non-tender, non-distended. Pos BS x 4 MS: Extremities nontender, no edema, normal ROM. Right patella with abrasion.  Neurologic: Alert and oriented.  Psych: appropriate mood and affect  LAB RESULTS Results for orders placed or performed during the hospital encounter of 09/28/16 (from the past 24 hour(s))  Urinalysis, Routine w reflex microscopic     Status: Abnormal   Collection Time: 09/28/16  9:35 AM  Result Value Ref Range   Color, Urine STRAW (A) YELLOW   APPearance CLEAR CLEAR   Specific Gravity, Urine 1.010 1.005 - 1.030   pH 6.0 5.0 - 8.0   Glucose, UA NEGATIVE NEGATIVE mg/dL   Hgb urine dipstick NEGATIVE NEGATIVE   Bilirubin Urine NEGATIVE NEGATIVE   Ketones, ur NEGATIVE NEGATIVE mg/dL   Protein, ur NEGATIVE NEGATIVE mg/dL   Nitrite NEGATIVE NEGATIVE   Leukocytes, UA NEGATIVE NEGATIVE  Pregnancy, urine POC     Status: None   Collection Time: 09/28/16  9:55 AM  Result Value Ref Range   Preg Test, Ur NEGATIVE NEGATIVE    IMAGING No results found.  MAU COURSE Elevated BP. Patient took her BP medications in MAU. On recheck improved.  MDM Plan of care reviewed with patient, including labs and tests ordered and medical  treatment.   ASSESSMENT 1. Postmenopausal bleeding   2. Fall, initial encounter     PLAN Referral sent to admin pool to work patient up for postmenopausal bleeding Mechanical fall without concern  Discharge home in stable condition. Handout given - see AVS   Caryl Ada, DO OB Fellow 09/28/2016 9:43 AM

## 2016-09-28 NOTE — MAU Note (Addendum)
Pt fell day before yesterday at the store hit her right  knee. Right Knee has a dry abrasion on it. Pt stated she could not walk on it yesterday. It is better today. Unsure when her LMP maybe 4 seeks ago. Has been on and off. Thinks she is pregnant because she has been nauseated.

## 2016-09-28 NOTE — Discharge Instructions (Signed)
Postmenopausal Bleeding Postmenopausal bleeding is any bleeding after menopause. Menopause is when a woman's period stops. Any type of bleeding after menopause is concerning. It should be checked by your doctor. Any treatment will depend on the cause. Follow these instructions at home: Watch your condition for any changes.  Avoid the use of tampons and douches as told by your doctor.  Change your pads often.  Get regular pelvic exams and Pap tests.  Keep all appointments for tests as told by your doctor.  Contact a doctor if:  Your bleeding lasts for more than 1 week.  You have belly (abdominal) pain.  You have bleeding after sex (intercourse). Get help right away if:  You have a fever, chills, a headache, dizziness, muscle aches, and bleeding.  You have strong pain with bleeding.  You have clumps of blood (blood clots) coming from your vagina.  You have bleeding and need more than 1 pad an hour.  You feel like you are going to pass out (faint). This information is not intended to replace advice given to you by your health care provider. Make sure you discuss any questions you have with your health care provider. Document Released: 11/30/2007 Document Revised: 07/29/2015 Document Reviewed: 09/19/2012 Elsevier Interactive Patient Education  2017 Elsevier Inc.  

## 2016-10-02 ENCOUNTER — Telehealth: Payer: Self-pay | Admitting: *Deleted

## 2016-10-02 NOTE — Telephone Encounter (Signed)
Patient was in MAU for post menopausal bleeding 7/76/18. Was told to f/u to be rechecked. Will forward to admin pool for scheduling.

## 2016-10-12 ENCOUNTER — Telehealth: Payer: Self-pay | Admitting: *Deleted

## 2016-10-12 NOTE — Telephone Encounter (Signed)
Received DMV papers to fill out from [facility]( marg[place]) . patient aware may be few weeks for them to be completed.  Will call when ready for pick up.

## 2016-10-17 ENCOUNTER — Telehealth: Payer: Self-pay | Admitting: Cardiology

## 2016-10-17 NOTE — Telephone Encounter (Signed)
Duplicate msg.

## 2016-10-17 NOTE — Telephone Encounter (Signed)
F/u message  Pt call to f/u on DMV paperwork. Pt states she needs the paperwork filled out as soon as possible. Pt would like to speak with RN. Please call back to discuss

## 2016-10-17 NOTE — Telephone Encounter (Signed)
Returned call. Pt called regarding DMV paperwork she dropped off a few days ago. Informed patient Jasmine DecemberSharon is out of office. Reminded her the paperwork may take a few weeks to be completed and that we would call once ready. Patient asked if Jasmine DecemberSharon can call when back in office w any updates. Informed pt I would route msg. Pt verbalized understanding and thanks.

## 2016-10-17 NOTE — Telephone Encounter (Signed)
New message      Pt [facility] to talk to the nurse [facility].  Pt is aware Dr Herbie BaltimoreHarding is out of the office this week.  She insist on talking to the nurse.  Please call

## 2016-10-23 ENCOUNTER — Ambulatory Visit (INDEPENDENT_AMBULATORY_CARE_PROVIDER_SITE_OTHER): Payer: Medicare HMO | Admitting: Obstetrics & Gynecology

## 2016-10-23 ENCOUNTER — Encounter: Payer: Self-pay | Admitting: Obstetrics & Gynecology

## 2016-10-23 ENCOUNTER — Other Ambulatory Visit: Payer: Self-pay | Admitting: Obstetrics & Gynecology

## 2016-10-23 VITALS — BP 160/88 | HR 84 | Ht 67.5 in | Wt 252.2 lb

## 2016-10-23 DIAGNOSIS — Z0000 Encounter for general adult medical examination without abnormal findings: Secondary | ICD-10-CM | POA: Diagnosis not present

## 2016-10-23 DIAGNOSIS — N950 Postmenopausal bleeding: Secondary | ICD-10-CM

## 2016-10-23 DIAGNOSIS — Z1231 Encounter for screening mammogram for malignant neoplasm of breast: Secondary | ICD-10-CM

## 2016-10-23 NOTE — Progress Notes (Signed)
   Subjective:    Patient ID: Erin Riley, female    DOB: 19-Sep-1950, 66 y.o.   MRN: [id number]  HPI 66 yo aa P3 here for PMB. She was seen in the MAU 09-28-16 with the complaint of PMB. She was also concerned that she may be pregnant.   Review of Systems     Objective:   Physical Exam Obese black female, no acute distress Abd- benign Cervix with no lesions, nothing noted on gyn exam to explain PMB       Assessment & Plan:  Preventative care- mammogram ordered PMB- schedule gyn u/s If EMBX needed, pretreat with cytotec

## 2016-10-23 NOTE — Progress Notes (Signed)
Pt reports that she had a lot of periods last year.

## 2016-10-24 ENCOUNTER — Encounter: Payer: Self-pay | Admitting: *Deleted

## 2016-10-24 NOTE — Telephone Encounter (Signed)
PATIENT CALLED BACK TO STATE SHE WILL PICK THE FORM.ON Friday 10/27/16

## 2016-10-24 NOTE — Telephone Encounter (Signed)
Follow up     Please fax the paper work for her -to the [facility] in [place] , please call to confirm that you are going to do this for her

## 2016-10-24 NOTE — Telephone Encounter (Signed)
Spoke to patient. She states she doesn't have fax number, thinks this is on the forms she dropped off for MD signature. Informed her we will call her to let her know when these are ready. If fax number not on forms, I will call DMV to find out where to send.

## 2016-10-26 ENCOUNTER — Emergency Department (HOSPITAL_COMMUNITY): Payer: Medicare HMO

## 2016-10-26 ENCOUNTER — Encounter (HOSPITAL_COMMUNITY): Payer: Self-pay

## 2016-10-26 ENCOUNTER — Other Ambulatory Visit: Payer: Self-pay

## 2016-10-26 ENCOUNTER — Emergency Department (HOSPITAL_COMMUNITY)
Admission: EM | Admit: 2016-10-26 | Discharge: 2016-10-26 | Disposition: A | Discharge status: Home / Self Care | Payer: Medicare HMO | Attending: Emergency Medicine | Admitting: Emergency Medicine

## 2016-10-26 DIAGNOSIS — Z7982 Long term (current) use of aspirin: Secondary | ICD-10-CM | POA: Diagnosis not present

## 2016-10-26 DIAGNOSIS — F209 Schizophrenia, unspecified: Secondary | ICD-10-CM | POA: Insufficient documentation

## 2016-10-26 DIAGNOSIS — R51 Headache: Secondary | ICD-10-CM | POA: Diagnosis present

## 2016-10-26 DIAGNOSIS — R519 Headache, unspecified: Secondary | ICD-10-CM

## 2016-10-26 DIAGNOSIS — Z79899 Other long term (current) drug therapy: Secondary | ICD-10-CM | POA: Diagnosis not present

## 2016-10-26 DIAGNOSIS — I2510 Atherosclerotic heart disease of native coronary artery without angina pectoris: Secondary | ICD-10-CM | POA: Insufficient documentation

## 2016-10-26 DIAGNOSIS — N183 Chronic kidney disease, stage 3 (moderate): Secondary | ICD-10-CM | POA: Diagnosis not present

## 2016-10-26 DIAGNOSIS — Z87891 Personal history of nicotine dependence: Secondary | ICD-10-CM | POA: Insufficient documentation

## 2016-10-26 DIAGNOSIS — I129 Hypertensive chronic kidney disease with stage 1 through stage 4 chronic kidney disease, or unspecified chronic kidney disease: Secondary | ICD-10-CM | POA: Diagnosis not present

## 2016-10-26 DIAGNOSIS — I160 Hypertensive urgency: Secondary | ICD-10-CM | POA: Diagnosis not present

## 2016-10-26 DIAGNOSIS — R1084 Generalized abdominal pain: Secondary | ICD-10-CM | POA: Diagnosis not present

## 2016-10-26 LAB — COMPREHENSIVE METABOLIC PANEL
ALBUMIN: 3.7 g/dL (ref 3.5–5.0)
ALK PHOS: 58 U/L (ref 38–126)
ALT: 22 U/L (ref 14–54)
AST: 35 U/L (ref 15–41)
Anion gap: 6 (ref 5–15)
BILIRUBIN TOTAL: 0.7 mg/dL (ref 0.3–1.2)
BUN: 11 mg/dL (ref 6–20)
CALCIUM: 9.6 mg/dL (ref 8.9–10.3)
CO2: 28 mmol/L (ref 22–32)
CREATININE: 1.13 mg/dL — AB (ref 0.44–1.00)
Chloride: 102 mmol/L (ref 101–111)
GFR calc Af Amer: 58 mL/min — ABNORMAL LOW (ref >60)
GFR calc non Af Amer: 50 mL/min — ABNORMAL LOW (ref >60)
GLUCOSE: 94 mg/dL (ref 65–99)
Potassium: 4.9 mmol/L (ref 3.5–5.1)
Sodium: 136 mmol/L (ref 135–145)
TOTAL PROTEIN: 6.9 g/dL (ref 6.5–8.1)

## 2016-10-26 LAB — URINALYSIS, ROUTINE W REFLEX MICROSCOPIC
Bilirubin Urine: NEGATIVE
Glucose, UA: NEGATIVE mg/dL
Hgb urine dipstick: NEGATIVE
Ketones, ur: NEGATIVE mg/dL
Nitrite: NEGATIVE
Protein, ur: 30 mg/dL — AB
SPECIFIC GRAVITY, URINE: 1.014 (ref 1.005–1.030)
pH: 7 (ref 5.0–8.0)

## 2016-10-26 LAB — CBC WITH DIFFERENTIAL/PLATELET
BASOS ABS: 0 10*3/uL (ref 0.0–0.1)
BASOS PCT: 0 %
EOS ABS: 0.1 10*3/uL (ref 0.0–0.7)
Eosinophils Relative: 2 %
HEMATOCRIT: 41.1 % (ref 36.0–46.0)
HEMOGLOBIN: 13.4 g/dL (ref 12.0–15.0)
Lymphocytes Relative: 40 %
Lymphs Abs: 2.5 10*3/uL (ref 0.7–4.0)
MCH: 30.6 pg (ref 26.0–34.0)
MCHC: 32.6 g/dL (ref 30.0–36.0)
MCV: 93.8 fL (ref 78.0–100.0)
MONOS PCT: 7 %
Monocytes Absolute: 0.5 10*3/uL (ref 0.1–1.0)
NEUTROS ABS: 3.2 10*3/uL (ref 1.7–7.7)
NEUTROS PCT: 51 %
Platelets: 292 10*3/uL (ref 150–400)
RBC: 4.38 MIL/uL (ref 3.87–5.11)
RDW: 13.5 % (ref 11.5–15.5)
WBC: 6.2 10*3/uL (ref 4.0–10.5)

## 2016-10-26 LAB — I-STAT TROPONIN, ED: TROPONIN I, POC: 0 ng/mL (ref 0.00–0.08)

## 2016-10-26 LAB — LIPASE, BLOOD: LIPASE: 30 U/L (ref 11–51)

## 2016-10-26 MED ORDER — CLONIDINE HCL 0.1 MG PO TABS
0.1000 mg | ORAL_TABLET | Freq: Once | ORAL | Status: AC
  Administered 2016-10-26: 0.1 mg via ORAL
  Filled 2016-10-26: qty 1

## 2016-10-26 MED ORDER — MORPHINE SULFATE (PF) 4 MG/ML IV SOLN
4.0000 mg | Freq: Once | INTRAVENOUS | Status: AC
  Administered 2016-10-26: 4 mg via INTRAVENOUS
  Filled 2016-10-26: qty 1

## 2016-10-26 MED ORDER — HYDRALAZINE HCL 20 MG/ML IJ SOLN
5.0000 mg | Freq: Once | INTRAMUSCULAR | Status: AC
  Administered 2016-10-26: 5 mg via INTRAVENOUS
  Filled 2016-10-26: qty 1

## 2016-10-26 MED ORDER — CARVEDILOL 12.5 MG PO TABS: 25.0000 mg | ORAL_TABLET | Freq: Two times a day (BID) | ORAL | Status: DC

## 2016-10-26 MED ORDER — ACETAMINOPHEN 500 MG PO TABS
1000.0000 mg | ORAL_TABLET | Freq: Once | ORAL | Status: AC
  Administered 2016-10-26: 1000 mg via ORAL
  Filled 2016-10-26: qty 2

## 2016-10-26 MED ORDER — CARVEDILOL 12.5 MG PO TABS
25.0000 mg | ORAL_TABLET | Freq: Once | ORAL | Status: AC
  Administered 2016-10-26: 25 mg via ORAL
  Filled 2016-10-26: qty 2

## 2016-10-26 MED ORDER — HYDRALAZINE HCL 25 MG PO TABS
75.0000 mg | ORAL_TABLET | Freq: Once | ORAL | Status: AC
  Administered 2016-10-26: 75 mg via ORAL
  Filled 2016-10-26: qty 3

## 2016-10-26 MED ORDER — LISINOPRIL 5 MG PO TABS: 5.0000 mg | ORAL_TABLET | Freq: Every day | ORAL | 0 refills | Status: DC
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
## 2016-10-26 NOTE — ED Triage Notes (Signed)
Pt presents for evaluation of migraine since 0800 this AM. Pt reports took BP meds this am and felt like it made the pain worse. Pt also endorses some abd pain, denies N/V/D. Reports abd pain related to headache.

## 2016-10-26 NOTE — ED Provider Notes (Signed)
Patient was handed off to me at shift change by previous EDPA Street pending labs, med administration and reassessment for VS and headache normalization. Please see previous EDPA note for full HPI, ROS and PE.  Briefly, pt is a 66 yo female with h/o HTN, CKD, CAD, HTN, HLD, PAD, schizophrenia who presents to ED for evaluation of headache in setting of elevated BP. HTN medications include hydralazine, coreg and isosorbide, she reports medication compliance.   ED lab work has been reviewed including CBC, CMP, lipase, U/A, troponin, EKG and CT scan head. All overall reassuring. Cr/BUN 1.13 58, which is patient's baseline.   On my exam, neuro intact. Rates headache is better 8/10. SBP 185, manual taken by RN 190.  2045: At shift change, SBP remained elevated highest 204 despite IV hydralazine, clonidine and pain control. Oral HTN medications were added by supervising physician including coreg, hydralazine and tylenol. Will reassess after meds have been given.    2236: BP normalized to SBP 160s for the last 35 min. Will add lisinopril 5mg  to her BP regimen for renal protection. Encouraged she establish care with cardiology for HTN f/u. Pt considered safe for discharge at this time. Discussed return precautions. She verbalized understanding and is agreeable with plan. Patient, ED treatment and discharge plan was discussed with supervising physician who is agreeable with plan.    Liberty Handy, PA-C 10/26/16 2317    Arby Barrette, MD 10/30/16 [phone]

## 2016-10-26 NOTE — Discharge Instructions (Signed)
You presented to ED for evaluation of headache. Your blood pressure was elevated. We were able to bring your blood pressure down with your oral blood pressure medications and pain control.   Please continue taking your blood pressure medications as indicated. Additionally, we are going to add another blood pressure medication to make sure your blood pressure is well controlled. Please start taking lisinopril once daily.   You need to establish care with a cardiologist. Please contact cardiologist (see above) as soon as possible so they can see you in office within 1-2 weeks to re-evaluate your blood pressure.   Check your blood pressure at home. Your top number should always be around 150-160.   Return to ED if you develop worsening and persistent headache with visual changes, nausea, vomiting, CP, SOB, abdominal pain, lower extremity swelling.

## 2016-10-26 NOTE — ED Provider Notes (Signed)
Medical screening examination/treatment/procedure(s) were conducted as a shared visit with non-physician practitioner(s) and myself.  I personally evaluated the patient during the encounter.   EKG Interpretation  Date/Time:  Thursday October 26 2016 19:29:03 EDT Ventricular Rate:  57 PR Interval:  194 QRS Duration: 94 QT Interval:  394 QTC Calculation: 383 R Axis:   26 Text Interpretation:  Sinus bradycardia Possible Left atrial enlargement Left ventricular hypertrophy Nonspecific T wave abnormality Abnormal ECG agree,no sig change from previous Confirmed by Arby Barrette [id number]) on 10/26/2016 7:48:54 PM     Patient has frontal headache that is throbbing in quality. She does not have associated symptoms of confusion, nausea vomiting or focal neurologic deficit. On examination the patient is alert and appropriate. She does not show signs of mental status change, incoordination, or visual changes. Patient blood pressure is significant elevated. Patient is alert and appropriate. She shows no confusion or incoordination. Heart is regular. Lungs are clear. Cranial nerves are intact. Motor strength is 4\4. Plan is to treat patients hypertension and headache. Patient needs her own evening medications. Upon reassessment patient has improved significantly. Blood pressure is moving toward the patient's frequent baseline. I do not suspect hypertensive encephalopathy. I feel patient is stable for discharge with close follow-up. I agree with adding lisinopril to the patient's medication regimen.   Arby Barrette, MD 10/29/16 [phone]

## 2016-10-26 NOTE — ED Notes (Signed)
Patient brought to [facility]. Given warm blanket and ice pack for forehead for HA. Report to Nancee Liter. RN

## 2016-10-27 ENCOUNTER — Other Ambulatory Visit: Payer: Self-pay | Admitting: *Deleted

## 2016-10-27 MED ORDER — ATORVASTATIN CALCIUM 40 MG PO TABS: 40.0000 mg | ORAL_TABLET | Freq: Every day | ORAL | 3 refills | Status: DC

## 2016-10-27 MED ORDER — LISINOPRIL 5 MG PO TABS: 5.0000 mg | ORAL_TABLET | Freq: Every day | ORAL | 3 refills | Status: DC

## 2016-10-27 MED ORDER — SPIRONOLACTONE 25 MG PO TABS: 25.0000 mg | ORAL_TABLET | Freq: Every day | ORAL | 3 refills | Status: DC

## 2016-10-27 NOTE — Telephone Encounter (Signed)
PATIENT GIVEN DMV FORM - CARDIOVASCULAR PORTION COMPLETED . PATIENT AWARE  SHE NEEDS TO HAVE THE REST COMPLETED BY HER OTHER DOCTORS

## 2016-10-31 ENCOUNTER — Ambulatory Visit (HOSPITAL_COMMUNITY)
Admission: RE | Admit: 2016-10-31 | Discharge: 2016-10-31 | Disposition: A | Discharge status: Home / Self Care | Payer: Medicare HMO | Source: Ambulatory Visit | Attending: Obstetrics & Gynecology | Admitting: Obstetrics & Gynecology

## 2016-10-31 DIAGNOSIS — R938 Abnormal findings on diagnostic imaging of other specified body structures: Secondary | ICD-10-CM | POA: Diagnosis not present

## 2016-10-31 DIAGNOSIS — N950 Postmenopausal bleeding: Secondary | ICD-10-CM | POA: Insufficient documentation

## 2016-11-01 ENCOUNTER — Telehealth: Payer: Self-pay | Admitting: Cardiology

## 2016-11-01 NOTE — Telephone Encounter (Signed)
New message     *STAT* If patient is at the pharmacy, call can be transferred to refill team.   1. Which medications need to be refilled? (please list name of each medication and dose if known) digox 125 mg  2. Which pharmacy/location (including street and city if local pharmacy) is medication to be sent to? [facility] on [address]  3. Do they need a 30 day or 90 day supply? 30 day

## 2016-11-01 NOTE — Telephone Encounter (Signed)
Spoke with pt, digoxin was stopped at her visit in march. She reports she did not have any and wanted to make sure she was not supposed to be taking.

## 2016-11-08 ENCOUNTER — Telehealth: Payer: Self-pay | Admitting: General Practice

## 2016-11-08 NOTE — Telephone Encounter (Signed)
Patient called and left message stating she is calling about her mammogram appt. Called patient and informed her of appt. Patient states she already has something that day and cannot come then and is requesting we reschedule her appt for her. [facility] and [facility]13 @ 1pm. Called patient back and informed her of appt. Patient verbalized understanding & had no questions

## 2016-11-13 ENCOUNTER — Other Ambulatory Visit: Payer: Self-pay | Admitting: Cardiology

## 2016-11-13 MED ORDER — LISINOPRIL 5 MG PO TABS: 5.0000 mg | ORAL_TABLET | Freq: Every day | ORAL | 2 refills | Status: DC

## 2016-11-13 NOTE — Telephone Encounter (Signed)
°*  STAT* If patient is at the pharmacy, call can be transferred to refill team.   1. Which medications need to be refilled? (please list name of each medication and dose if known) New prescription for Lisinopril and Isosorbide    2. Which pharmacy/location (including street and city if local pharmacy) is medication to be sent to?[phone]  3. Do they need a 30 day or 90 day supply?Lisinopril #15 and Isosorbide #45 and refills

## 2016-11-13 NOTE — Telephone Encounter (Signed)
Attempted to call patient, no answer, and unable to leave message.

## 2016-11-13 NOTE — Addendum Note (Signed)
Addended by: Kandice RobinsonsYOUNG, Caprice Wasko T on: 11/13/2016 04:42 PM   Modules accepted: Orders

## 2016-11-13 NOTE — Telephone Encounter (Signed)
Rx(s) sent to pharmacy electronically.  

## 2016-11-14 ENCOUNTER — Ambulatory Visit: Payer: Self-pay

## 2016-11-15 ENCOUNTER — Telehealth: Payer: Self-pay | Admitting: General Practice

## 2016-11-15 ENCOUNTER — Encounter: Payer: Self-pay | Admitting: Obstetrics & Gynecology

## 2016-11-15 DIAGNOSIS — Z01818 Encounter for other preprocedural examination: Secondary | ICD-10-CM

## 2016-11-15 MED ORDER — MISOPROSTOL 200 MCG PO TABS: 600.0000 ug | ORAL_TABLET | Freq: Once | ORAL | 0 refills | Status: DC

## 2016-11-15 MED ORDER — MISOPROSTOL 200 MCG PO TABS: 200.0000 ug | ORAL_TABLET | Freq: Once | ORAL | 0 refills | Status: DC

## 2016-11-15 NOTE — Telephone Encounter (Signed)
Called patient and informed her of results, biopsy recommendation & medication sent to pharmacy. Discussed with patient having her take the medication the night before her appt with us. Told patient the front office will call her with an appt to see Dr Marice Potterove. Patient verbalized understanding to all and said she would see us tomorrow. Told patient [facility]. Patient states no it isn't. I'm not going there. I don't need my breast checked they're fine. Told patient the mammogram is recommended to make sure everything is okay because we don't know without the test. Reminded her of time and patient states no it isn't you're lying to me I didn't agree to that. Reminded patient of phone call in which I talked to her two weeks ago and she wanted the mammogram rescheduled because she had an appt yesterday and needed the appt changed. Patient states yes that's right. Told patient of her appt tomorrow and that she currently doesn't have an appointment in our office but someone from the front office will call her with an appt. Patient states she is supposed to be coming in tomorrow. I told patient that appt is for a mammogram not an appt with Dr Marice Potterove. Patient disagreed and states she had an appt two weeks and her had baby/body checked. Told patient yes that's the ultrasound and reminded her of results and Dr Ellin Sabaove's plan for a biopsy. Patient verbalized understanding. Reminded patient of medication instructions, mammogram appt, & that someone would contact her with an appt in our office. Patient verbalized understanding and had no questions

## 2016-11-15 NOTE — Telephone Encounter (Signed)
-----   Message from Allie BossierMyra C Dove, MD sent at 11/02/2016  5:14 PM EDT ----- [facility] Please give her a prescription for cytotec 600 mcg PO the night before her EMBX. Thanks

## 2016-11-16 ENCOUNTER — Ambulatory Visit: Payer: Self-pay

## 2016-11-21 ENCOUNTER — Other Ambulatory Visit (HOSPITAL_COMMUNITY): Payer: Self-pay | Admitting: Obstetrics & Gynecology

## 2016-11-21 ENCOUNTER — Ambulatory Visit: Payer: Self-pay

## 2016-11-21 DIAGNOSIS — Z0000 Encounter for general adult medical examination without abnormal findings: Secondary | ICD-10-CM

## 2016-11-23 ENCOUNTER — Telehealth: Payer: Self-pay | Admitting: *Deleted

## 2016-11-23 NOTE — Telephone Encounter (Signed)
Hien called today and left a message she had a mammogram and  Wants [place] to call her about that.

## 2016-11-24 NOTE — Telephone Encounter (Signed)
I called Erin Riley and we discussed her appointment for her mammogram- I explained the date /time and location and that it is not here at [facility].  She also discussed questions about her appointment here and that she may not be able to be here until 2-2;15- I offered to transfer to registrar and she declined, states she would try to get here on time.

## 2016-12-04 ENCOUNTER — Encounter (HOSPITAL_COMMUNITY): Payer: Self-pay | Admitting: *Deleted

## 2016-12-04 ENCOUNTER — Emergency Department (HOSPITAL_COMMUNITY)
Admission: EM | Admit: 2016-12-04 | Discharge: 2016-12-04 | Disposition: A | Discharge status: Home / Self Care | Payer: Medicare HMO | Attending: Emergency Medicine | Admitting: Emergency Medicine

## 2016-12-04 ENCOUNTER — Ambulatory Visit
Admission: RE | Admit: 2016-12-04 | Discharge: 2016-12-04 | Disposition: A | Discharge status: Home / Self Care | Payer: Medicare HMO | Source: Ambulatory Visit | Attending: Obstetrics & Gynecology | Admitting: Obstetrics & Gynecology

## 2016-12-04 DIAGNOSIS — Z1231 Encounter for screening mammogram for malignant neoplasm of breast: Secondary | ICD-10-CM

## 2016-12-04 DIAGNOSIS — Z79899 Other long term (current) drug therapy: Secondary | ICD-10-CM | POA: Insufficient documentation

## 2016-12-04 DIAGNOSIS — I129 Hypertensive chronic kidney disease with stage 1 through stage 4 chronic kidney disease, or unspecified chronic kidney disease: Secondary | ICD-10-CM | POA: Insufficient documentation

## 2016-12-04 DIAGNOSIS — Z87891 Personal history of nicotine dependence: Secondary | ICD-10-CM | POA: Diagnosis not present

## 2016-12-04 DIAGNOSIS — N898 Other specified noninflammatory disorders of vagina: Secondary | ICD-10-CM | POA: Diagnosis present

## 2016-12-04 DIAGNOSIS — N183 Chronic kidney disease, stage 3 (moderate): Secondary | ICD-10-CM | POA: Diagnosis not present

## 2016-12-04 DIAGNOSIS — Z7982 Long term (current) use of aspirin: Secondary | ICD-10-CM | POA: Insufficient documentation

## 2016-12-04 DIAGNOSIS — N3000 Acute cystitis without hematuria: Secondary | ICD-10-CM | POA: Insufficient documentation

## 2016-12-04 DIAGNOSIS — I2510 Atherosclerotic heart disease of native coronary artery without angina pectoris: Secondary | ICD-10-CM | POA: Insufficient documentation

## 2016-12-04 LAB — URINALYSIS, ROUTINE W REFLEX MICROSCOPIC
Bilirubin Urine: NEGATIVE
Glucose, UA: NEGATIVE mg/dL
KETONES UR: NEGATIVE mg/dL
Nitrite: NEGATIVE
PROTEIN: 100 mg/dL — AB
Specific Gravity, Urine: 1.017 (ref 1.005–1.030)
pH: 5 (ref 5.0–8.0)

## 2016-12-04 LAB — WET PREP, GENITAL
CLUE CELLS WET PREP: NONE SEEN
Sperm: NONE SEEN
TRICH WET PREP: NONE SEEN
Yeast Wet Prep HPF POC: NONE SEEN

## 2016-12-04 MED ORDER — CEPHALEXIN 500 MG PO CAPS: 500.0000 mg | ORAL_CAPSULE | Freq: Four times a day (QID) | ORAL | 0 refills | Status: AC

## 2016-12-04 NOTE — ED Notes (Signed)
Pelvic done by Irving Burton - PA and Kenney Houseman - EMT assisted.

## 2016-12-04 NOTE — ED Provider Notes (Signed)
  Face-to-face evaluation   History: She presents for evaluation of vaginal itching and burning.  Physical exam: Alert elderly female who is calm and comfortable.  Abdomen soft and nontender.  MDM -evaluation is consistent with UTI.  Doubt vaginitis, serious bacterial infection or metabolic instability.  Medical screening examination/treatment/procedure(s) were conducted as a shared visit with non-physician practitioner(s) and myself.  I personally evaluated the patient during the encounter   Mancel Bale, MD 12/05/16 [phone]

## 2016-12-04 NOTE — ED Notes (Signed)
Lab is adding on a urine culture at this time.

## 2016-12-04 NOTE — ED Notes (Signed)
Pt stated that she has not taken her BP meds today. Pt stated that she will take them as soon as she gets home.

## 2016-12-04 NOTE — ED Triage Notes (Signed)
Pt states she is here with vaginal infection of itching and burning for the last 2 days.  She denies diabetes or antibiotics recently.

## 2016-12-04 NOTE — ED Notes (Signed)
Pt. upset about the wait.Tried to explain to her how the acuities operate. She is the next pt. To go back with the same acuity.

## 2016-12-04 NOTE — Discharge Instructions (Signed)
You have a urinary tract infection. Please fill the prescription for the antibiotic and take 4xs a day for the next 7days  Please take all of your antibiotics until finished!   You may develop abdominal discomfort or diarrhea from the antibiotic.  You may help offset this with probiotics which you can buy or get in yogurt. Do not eat  or take the probiotics until 2 hours after your antibiotic.   Please schedule an appointment with your primary care doctor (Dr. Ronne Binning) for recheck of your blood pressure and further medication maintenance as your blood pressure was high today in the Emergency Department. Also follow up with your primary doctor if your symptoms continue, despite taking the antibiotic.   Please return to the ER if you have vomiting that will not stop, worsening abdominal pain or fever with your abdominal pain. Also for any new or worsening symptoms.
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
## 2016-12-05 LAB — GC/CHLAMYDIA PROBE AMP (~~LOC~~) NOT AT ARMC
CHLAMYDIA, DNA PROBE: NEGATIVE
Neisseria Gonorrhea: NEGATIVE

## 2016-12-06 ENCOUNTER — Other Ambulatory Visit (HOSPITAL_COMMUNITY): Payer: Self-pay | Admitting: Obstetrics & Gynecology

## 2016-12-06 DIAGNOSIS — R928 Other abnormal and inconclusive findings on diagnostic imaging of breast: Secondary | ICD-10-CM

## 2016-12-06 LAB — URINE CULTURE

## 2016-12-07 ENCOUNTER — Ambulatory Visit (INDEPENDENT_AMBULATORY_CARE_PROVIDER_SITE_OTHER): Payer: Medicare HMO | Admitting: Obstetrics & Gynecology

## 2016-12-07 ENCOUNTER — Encounter: Payer: Self-pay | Admitting: Obstetrics & Gynecology

## 2016-12-07 ENCOUNTER — Telehealth: Payer: Self-pay | Admitting: *Deleted

## 2016-12-07 ENCOUNTER — Other Ambulatory Visit (HOSPITAL_COMMUNITY)
Admission: RE | Admit: 2016-12-07 | Discharge: 2016-12-07 | Disposition: A | Discharge status: Home / Self Care | Payer: Medicare HMO | Source: Ambulatory Visit | Attending: Obstetrics & Gynecology | Admitting: Obstetrics & Gynecology

## 2016-12-07 VITALS — BP 131/83 | HR 86 | Wt 246.9 lb

## 2016-12-07 DIAGNOSIS — N950 Postmenopausal bleeding: Secondary | ICD-10-CM | POA: Diagnosis present

## 2016-12-07 DIAGNOSIS — N840 Polyp of corpus uteri: Secondary | ICD-10-CM | POA: Diagnosis not present

## 2016-12-07 NOTE — Telephone Encounter (Signed)
Post ED Visit - Positive Culture Follow-up  Culture report reviewed by antimicrobial stewardship pharmacist:   Enzo Bi, Pharm.D.  Celedonio Miyamoto, [address].D., BCPS AQ-ID  Garvin Fila, Pharm.D., BCPS  Georgina Pillion, Pharm.D., BCPS  [place], [address]D., BCPS, AAHIVP  Estella Husk, Pharm.D., BCPS, AAHIVP  Lysle Pearl, PharmD, BCPS  Casilda Carls, PharmD, BCPS  Pollyann Samples, PharmD, BCPS  Positive urine culture Treated with Cephalexin, organism sensitive to the same and no further patient follow-up is required at this time.  Virl Axe [facility] 12/07/2016, 9:45 AM

## 2016-12-07 NOTE — Progress Notes (Signed)
Patient ID: Erin Riley, female   DOB: January 21, 1951, 66 y.o.   MRN: [id number]  Chief Complaint  Patient presents with  . Vaginal Bleeding    postmenopausal     HPI Erin Riley is a 66 y.o. female.  She is here for an EMBX. She denies current PMB but was seen in the MAU 7/18. She had an u/s that showed a thickeded endometrium. HPI  Past Medical History:  Diagnosis Date  . Chronic back pain   . Chronic kidney disease (CKD), stage II (mild)    Class I-II  . Coronary artery disease 04/2009   50% stenosis in the perforator of LAD; catheterization was for an abnormal Myoview in January 2000 showing anterior and inferolateral ischemia.  [name] Heart murmur    Previously reported moderate to severe MR. Most recent echo in May 2014 revealed mild MR.  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 At least 3 subsequent echocardiograms have shown improved/normal EF with moderate to severe concentric LVH and diastolic dysfunction with LVOT/intracavitary gradient  . History of schizophrenia    However I am not sure about the validity of this. She is not on any medications.  . Hyperlipidemia   . Hypertension   . Hypertensive hypertrophic cardiomyopathy: NYHA class II:  Echo: Severe concentric LVH with LV OT gradient; essentially preserved EF with diastolic dysfunction 02/15/2013   Not significantly symptomatic; echocardiogram from May 2014: Severe concentric LVH with EF 50-55%. Somewhat unbelievably grade 1 diastolic dysfunction with moderate LE dilation. Trivial MR noted.  . Left ventricular diastolic dysfunction, NYHA class 1 March 20   Severe concentric LVH (likely hypertensive), mild aortic sclerosis. Grade 1 diastolic dysfunction  . PAD (peripheral artery disease) [facility]) March 2013   Lower extremity Dopplers: R. SFA 50-60%, R. PTA proximally occluded with distal reconstitution;; L.  common iliac ~50%, L. SFA 50-70% stenosis, L. PTA < 50%  . Schizophrenia [facility])     Past Surgical History:  Procedure Laterality Date  . BUNIONECTOMY    . CARDIAC CATHETERIZATION  04/09/2009   rt and lt cath -non obstructive coronary disease   . carotid doppler  05/29/2011   left bulb/prox ICA moderate amtfibrous plaque with no evidence significant reduction.,right bulb /proximal ICA normal patency  . DOPPLER ECHOCARDIOGRAPHY  May 2014   EF 50-55%; severe concentric LVH; only grade 1 diastolic dysfunction. Mild aortic sclerosis - with LVOT /intracavitary gradient of roughly 20 mmHg mean. Mild to moderately dilated LA;; previously reported MR not seen  . DOPPLER ECHOCARDIOGRAPHY  02/02/2009   mod to severe regurg mitral, EF 20 TO 25%, tricuspid severe regurg. ,pulmonic moderate regurg.  . lower extremity doppler  05/29/2011   right SFA 50% to 59% diameter reduction,right posterior tibal atreery occlusive disease,reconstituting distally, left common illiac<50%,left SFA 50 to70%,left post. tibial <50%  . NM MYOCAR PERF WALL MOTION  03/2009   Persantine; EF 51%-both anterior and inferolateral ischemia    Family History  Problem Relation Age of Onset  . Hypertension Mother   . Breast cancer Neg Hx     Social History Social History  Substance Use Topics  . Smoking status: Former Smoker    Quit date: 05/14/2002  . Smokeless tobacco: Never Used  . Alcohol use No    No Known Allergies  Current Outpatient Prescriptions  Medication Sig Dispense Refill  . atorvastatin (LIPITOR) 40 MG tablet Take 1  tablet (40 mg total) by mouth daily. 90 tablet 3  . carvedilol (COREG) 25 MG tablet take 1 tablet by mouth twice a day with meals 60 tablet 9  . hydrALAZINE (APRESOLINE) 50 MG tablet take 1 and 1/2 tablets by mouth three times a day (Patient taking differently: take 75 mg tablets by mouth three times a day) 270 tablet 3  . Multiple Vitamins-Minerals (STRESS B COMPLEX/ZINC PO) Take 1 tablet by  mouth daily.    [name] aspirin EC 81 MG tablet Take 81 mg by mouth daily.    . cephALEXin (KEFLEX) 500 MG capsule Take 1 capsule (500 mg total) by mouth 4 (four) times daily. (Patient not taking: Reported on 12/07/2016) 28 capsule 0  . diclofenac sodium (VOLTAREN) 1 % GEL Apply 4 g topically 4 (four) times daily. (Patient not taking: Reported on 10/26/2016) 100 g 0  . dicyclomine (BENTYL) 20 MG tablet Take 1 tablet (20 mg total) by mouth 2 (two) times daily. (Patient not taking: Reported on 10/26/2016) 20 tablet 0  . isosorbide mononitrate (IMDUR) 60 MG 24 hr tablet take 1 and 1/2 tablets by mouth once daily (Patient not taking: Reported on 12/07/2016) 45 tablet 2  . lisinopril (PRINIVIL,ZESTRIL) 5 MG tablet Take 1 tablet (5 mg total) by mouth daily. 90 tablet 2  . misoprostol (CYTOTEC) 200 MCG tablet Take 3 tablets (600 mcg total) by mouth once. 3 tablet 0  . polyethylene glycol (MIRALAX / GLYCOLAX) packet Take 17 g by mouth daily. (Patient not taking: Reported on 10/26/2016) 14 each 0  . senna-docusate (SENOKOT-S) 8.6-50 MG tablet Take 1 tablet by mouth daily. (Patient not taking: Reported on 10/26/2016) 30 tablet 0  . spironolactone (ALDACTONE) 25 MG tablet Take 1 tablet (25 mg total) by mouth daily. (Patient not taking: Reported on 12/07/2016) 90 tablet 3  . Witch Hazel (TUCKS) 50 % PADS As directed (Patient not taking: Reported on 10/26/2016) 40 each 0   No current facility-administered medications for this visit.     Review of Systems Review of Systems  Blood pressure 131/83, pulse 86, weight 246 lb 14.4 oz (112 kg), last menstrual period 05/10/2013.  Physical Exam Physical Exam  Breathing, conversing, and ambulating normally Well nourished, well hydrated black female, no apparent distress  UPT negative, consent signed, time out done Cervix prepped with betadine and grasped with a single tooth tenaculum Uterus sounded to 9 cm Pipelle used for 2 passes with a moderate amount of tissue  obtained. She tolerated the procedure well.      Assessment    PMB    Plan    EMBX done- will await pathology       Hazen Brumett C Jamirra Curnow 12/07/2016, 1:41 PM

## 2016-12-11 ENCOUNTER — Emergency Department (HOSPITAL_COMMUNITY)
Admission: EM | Admit: 2016-12-11 | Discharge: 2016-12-11 | Disposition: A | Discharge status: Home / Self Care | Payer: Medicare HMO | Attending: Emergency Medicine | Admitting: Emergency Medicine

## 2016-12-11 ENCOUNTER — Emergency Department (HOSPITAL_COMMUNITY): Payer: Medicare HMO

## 2016-12-11 ENCOUNTER — Encounter (HOSPITAL_COMMUNITY): Payer: Self-pay | Admitting: Emergency Medicine

## 2016-12-11 DIAGNOSIS — K5792 Diverticulitis of intestine, part unspecified, without perforation or abscess without bleeding: Secondary | ICD-10-CM

## 2016-12-11 DIAGNOSIS — K5732 Diverticulitis of large intestine without perforation or abscess without bleeding: Secondary | ICD-10-CM | POA: Insufficient documentation

## 2016-12-11 DIAGNOSIS — Z79899 Other long term (current) drug therapy: Secondary | ICD-10-CM | POA: Diagnosis not present

## 2016-12-11 DIAGNOSIS — R1030 Lower abdominal pain, unspecified: Secondary | ICD-10-CM | POA: Diagnosis present

## 2016-12-11 DIAGNOSIS — I130 Hypertensive heart and chronic kidney disease with heart failure and stage 1 through stage 4 chronic kidney disease, or unspecified chronic kidney disease: Secondary | ICD-10-CM | POA: Insufficient documentation

## 2016-12-11 DIAGNOSIS — Z87891 Personal history of nicotine dependence: Secondary | ICD-10-CM | POA: Insufficient documentation

## 2016-12-11 DIAGNOSIS — I2510 Atherosclerotic heart disease of native coronary artery without angina pectoris: Secondary | ICD-10-CM | POA: Diagnosis not present

## 2016-12-11 DIAGNOSIS — N183 Chronic kidney disease, stage 3 (moderate): Secondary | ICD-10-CM | POA: Insufficient documentation

## 2016-12-11 DIAGNOSIS — I5030 Unspecified diastolic (congestive) heart failure: Secondary | ICD-10-CM | POA: Insufficient documentation

## 2016-12-11 LAB — URINALYSIS, ROUTINE W REFLEX MICROSCOPIC
Bilirubin Urine: NEGATIVE
GLUCOSE, UA: NEGATIVE mg/dL
KETONES UR: NEGATIVE mg/dL
Nitrite: NEGATIVE
PH: 6 (ref 5.0–8.0)
PROTEIN: NEGATIVE mg/dL
Specific Gravity, Urine: 1.024 (ref 1.005–1.030)

## 2016-12-11 LAB — CBC
HEMATOCRIT: 36.5 % (ref 36.0–46.0)
Hemoglobin: 11.9 g/dL — ABNORMAL LOW (ref 12.0–15.0)
MCH: 30.2 pg (ref 26.0–34.0)
MCHC: 32.6 g/dL (ref 30.0–36.0)
MCV: 92.6 fL (ref 78.0–100.0)
PLATELETS: 310 10*3/uL (ref 150–400)
RBC: 3.94 MIL/uL (ref 3.87–5.11)
RDW: 13 % (ref 11.5–15.5)
WBC: 6.9 10*3/uL (ref 4.0–10.5)

## 2016-12-11 LAB — COMPREHENSIVE METABOLIC PANEL
ALBUMIN: 3.6 g/dL (ref 3.5–5.0)
ALT: 14 U/L (ref 14–54)
AST: 17 U/L (ref 15–41)
Alkaline Phosphatase: 76 U/L (ref 38–126)
Anion gap: 6 (ref 5–15)
BUN: 23 mg/dL — AB (ref 6–20)
CHLORIDE: 104 mmol/L (ref 101–111)
CO2: 26 mmol/L (ref 22–32)
CREATININE: 1.43 mg/dL — AB (ref 0.44–1.00)
Calcium: 9.5 mg/dL (ref 8.9–10.3)
GFR calc Af Amer: 43 mL/min — ABNORMAL LOW (ref >60)
GFR, EST NON AFRICAN AMERICAN: 38 mL/min — AB (ref >60)
GLUCOSE: 143 mg/dL — AB (ref 65–99)
POTASSIUM: 3.7 mmol/L (ref 3.5–5.1)
SODIUM: 136 mmol/L (ref 135–145)
Total Bilirubin: 0.6 mg/dL (ref 0.3–1.2)
Total Protein: 7.5 g/dL (ref 6.5–8.1)

## 2016-12-11 LAB — LIPASE, BLOOD: LIPASE: 36 U/L (ref 11–51)

## 2016-12-11 MED ORDER — ONDANSETRON HCL 4 MG/2ML IJ SOLN
4.0000 mg | Freq: Once | INTRAMUSCULAR | Status: AC
  Administered 2016-12-11: 4 mg via INTRAVENOUS
  Filled 2016-12-11: qty 2

## 2016-12-11 MED ORDER — METRONIDAZOLE IN NACL 5-0.79 MG/ML-% IV SOLN
500.0000 mg | Freq: Once | INTRAVENOUS | Status: AC
  Administered 2016-12-11: 500 mg via INTRAVENOUS
  Filled 2016-12-11: qty 100

## 2016-12-11 MED ORDER — IOPAMIDOL (ISOVUE-300) INJECTION 61%
INTRAVENOUS | Status: AC
  Filled 2016-12-11: qty 100

## 2016-12-11 MED ORDER — SODIUM CHLORIDE 0.9 % IV SOLN
3.0000 g | Freq: Once | INTRAVENOUS | Status: AC
  Administered 2016-12-11: 3 g via INTRAVENOUS
  Filled 2016-12-11: qty 3

## 2016-12-11 MED ORDER — MORPHINE SULFATE (PF) 4 MG/ML IV SOLN
4.0000 mg | INTRAVENOUS | Status: DC | PRN
  Administered 2016-12-11: 4 mg via INTRAVENOUS
  Filled 2016-12-11: qty 1

## 2016-12-11 MED ORDER — IOPAMIDOL (ISOVUE-300) INJECTION 61%
100.0000 mL | Freq: Once | INTRAVENOUS | Status: AC | PRN
  Administered 2016-12-11: 80 mL via INTRAVENOUS

## 2016-12-11 MED ORDER — AMOXICILLIN-POT CLAVULANATE 875-125 MG PO TABS: 1.0000 | ORAL_TABLET | Freq: Two times a day (BID) | ORAL | 0 refills | Status: DC

## 2016-12-11 MED ORDER — TRAMADOL HCL 50 MG PO TABS: 50.0000 mg | ORAL_TABLET | Freq: Four times a day (QID) | ORAL | 0 refills | Status: DC | PRN

## 2016-12-11 MED ORDER — METRONIDAZOLE 500 MG PO TABS: 500.0000 mg | ORAL_TABLET | Freq: Two times a day (BID) | ORAL | 0 refills | Status: DC
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
## 2016-12-11 NOTE — Discharge Instructions (Signed)
Absolutely no alcohol while taking Flagyl. Take entire course of Augmentin and Flagyl. Tramadol for pain if not relieved with tylenol or motrin. Return to ER with fever, vomiting, or worsening pain. Routine follow up with Dr. Thea Silversmith

## 2016-12-11 NOTE — ED Triage Notes (Signed)
Per [facility] patient from home for abd pain that started around 4am patient reported to EMS that pain was due to possibly being pregnant.  Denies n/v/d.

## 2016-12-13 ENCOUNTER — Other Ambulatory Visit (HOSPITAL_COMMUNITY): Payer: Self-pay | Admitting: Obstetrics & Gynecology

## 2016-12-13 ENCOUNTER — Ambulatory Visit
Admission: RE | Admit: 2016-12-13 | Discharge: 2016-12-13 | Disposition: A | Discharge status: Home / Self Care | Payer: Medicare HMO | Source: Ambulatory Visit | Attending: Obstetrics & Gynecology | Admitting: Obstetrics & Gynecology

## 2016-12-13 DIAGNOSIS — N6489 Other specified disorders of breast: Secondary | ICD-10-CM

## 2016-12-13 DIAGNOSIS — R928 Other abnormal and inconclusive findings on diagnostic imaging of breast: Secondary | ICD-10-CM

## 2016-12-18 ENCOUNTER — Ambulatory Visit
Admission: RE | Admit: 2016-12-18 | Discharge: 2016-12-18 | Disposition: A | Discharge status: Home / Self Care | Payer: Medicare HMO | Source: Ambulatory Visit | Attending: Obstetrics & Gynecology | Admitting: Obstetrics & Gynecology

## 2016-12-18 ENCOUNTER — Other Ambulatory Visit (HOSPITAL_COMMUNITY): Payer: Self-pay | Admitting: Obstetrics & Gynecology

## 2016-12-18 ENCOUNTER — Ambulatory Visit
Admission: RE | Admit: 2016-12-18 | Discharge: 2016-12-18 | Disposition: A | Discharge status: Home / Self Care | Payer: Medicaid Other | Source: Ambulatory Visit | Attending: Obstetrics & Gynecology | Admitting: Obstetrics & Gynecology

## 2016-12-18 DIAGNOSIS — N6489 Other specified disorders of breast: Secondary | ICD-10-CM

## 2016-12-25 ENCOUNTER — Other Ambulatory Visit (HOSPITAL_COMMUNITY)
Admission: RE | Admit: 2016-12-25 | Discharge: 2016-12-25 | Disposition: A | Discharge status: Home / Self Care | Payer: Medicare HMO | Source: Ambulatory Visit | Attending: Obstetrics & Gynecology | Admitting: Obstetrics & Gynecology

## 2016-12-25 ENCOUNTER — Ambulatory Visit (INDEPENDENT_AMBULATORY_CARE_PROVIDER_SITE_OTHER): Payer: Medicare HMO | Admitting: Obstetrics & Gynecology

## 2016-12-25 ENCOUNTER — Encounter: Payer: Self-pay | Admitting: Obstetrics & Gynecology

## 2016-12-25 VITALS — BP 152/102 | HR 120 | Wt 243.1 lb

## 2016-12-25 DIAGNOSIS — Z23 Encounter for immunization: Secondary | ICD-10-CM

## 2016-12-25 DIAGNOSIS — Z01419 Encounter for gynecological examination (general) (routine) without abnormal findings: Secondary | ICD-10-CM | POA: Diagnosis present

## 2016-12-25 DIAGNOSIS — Z1151 Encounter for screening for human papillomavirus (HPV): Secondary | ICD-10-CM

## 2016-12-25 DIAGNOSIS — G8929 Other chronic pain: Secondary | ICD-10-CM

## 2016-12-25 DIAGNOSIS — M545 Low back pain: Principal | ICD-10-CM

## 2016-12-25 DIAGNOSIS — R109 Unspecified abdominal pain: Secondary | ICD-10-CM

## 2016-12-25 DIAGNOSIS — Z0000 Encounter for general adult medical examination without abnormal findings: Secondary | ICD-10-CM

## 2016-12-25 DIAGNOSIS — Z124 Encounter for screening for malignant neoplasm of cervix: Secondary | ICD-10-CM | POA: Diagnosis not present

## 2016-12-25 NOTE — Progress Notes (Signed)
Patient ID: TKEYA STENCIL, female   DOB: April 11, 1950, 66 y.o.   MRN: [id number]  Chief Complaint  Patient presents with  . Abdominal Pain    HPI GIRTIE WIERSMA is a 66 y.o. female.   HPI 66 yo commonlaw married AA P4 (2 grandkids) here today with the issue LBP. She was seen in the ED about 10 days ago. A CT showed a chronic Right hydrosalpinx and diverticulitis.  She has a h/o constipation but that is resolved currently with meds and lots of water.  Past Medical History:  Diagnosis Date  . Chronic back pain   . Chronic kidney disease (CKD), stage II (mild)    Class I-II  . Coronary artery disease 04/2009   50% stenosis in the perforator of LAD; catheterization was for an abnormal Myoview in January 2000 showing anterior and inferolateral ischemia.  [name] Heart murmur    Previously reported moderate to severe MR. Most recent echo in May 2014 revealed mild MR.  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 At least 3 subsequent echocardiograms have shown improved/normal EF with moderate to severe concentric LVH and diastolic dysfunction with LVOT/intracavitary gradient  . History of schizophrenia    However I am not sure about the validity of this. She is not on any medications.  . Hyperlipidemia   . Hypertension   . Hypertensive hypertrophic cardiomyopathy: NYHA class II:  Echo: Severe concentric LVH with LV OT gradient; essentially preserved EF with diastolic dysfunction 02/15/2013   Not significantly symptomatic; echocardiogram from May 2014: Severe concentric LVH with EF 50-55%. Somewhat unbelievably grade 1 diastolic dysfunction with moderate LE dilation. Trivial MR noted.  . Left ventricular diastolic dysfunction, NYHA class 1 March 20   Severe concentric LVH (likely hypertensive), mild aortic sclerosis. Grade 1 diastolic dysfunction  . PAD (peripheral artery disease)  [facility]) March 2013   Lower extremity Dopplers: R. SFA 50-60%, R. PTA proximally occluded with distal reconstitution;; L. common iliac ~50%, L. SFA 50-70% stenosis, L. PTA < 50%  . Schizophrenia [facility])     Past Surgical History:  Procedure Laterality Date  . BUNIONECTOMY    . CARDIAC CATHETERIZATION  04/09/2009   rt and lt cath -non obstructive coronary disease   . carotid doppler  05/29/2011   left bulb/prox ICA moderate amtfibrous plaque with no evidence significant reduction.,right bulb /proximal ICA normal patency  . DOPPLER ECHOCARDIOGRAPHY  May 2014   EF 50-55%; severe concentric LVH; only grade 1 diastolic dysfunction. Mild aortic sclerosis - with LVOT /intracavitary gradient of roughly 20 mmHg mean. Mild to moderately dilated LA;; previously reported MR not seen  . DOPPLER ECHOCARDIOGRAPHY  02/02/2009   mod to severe regurg mitral, EF 20 TO 25%, tricuspid severe regurg. ,pulmonic moderate regurg.  . lower extremity doppler  05/29/2011   right SFA 50% to 59% diameter reduction,right posterior tibal atreery occlusive disease,reconstituting distally, left common illiac<50%,left SFA 50 to70%,left post. tibial <50%  . NM MYOCAR PERF WALL MOTION  03/2009   Persantine; EF 51%-both anterior and inferolateral ischemia    Family History  Problem Relation Age of Onset  . Hypertension Mother   . Breast cancer Neg Hx     Social History Social History  Substance Use Topics  . Smoking status: Former Smoker    Quit date: 05/14/2002  . Smokeless tobacco: Never Used  . Alcohol use No   She has  sex occasionally. She is due for a pap smear. She is on disability.  No Known Allergies  Current Outpatient Prescriptions  Medication Sig Dispense Refill  . atorvastatin (LIPITOR) 40 MG tablet Take 1 tablet (40 mg total) by mouth daily. 90 tablet 3  . carvedilol (COREG) 25 MG tablet take 1 tablet by mouth twice a day with meals 60 tablet 9  . isosorbide mononitrate (IMDUR) 60 MG 24 hr tablet take  1 and 1/2 tablets by mouth once daily 45 tablet 2  . amoxicillin-clavulanate (AUGMENTIN) 875-125 MG tablet Take 1 tablet by mouth 2 (two) times daily. (Patient not taking: Reported on 12/25/2016) 14 tablet 0  . diclofenac sodium (VOLTAREN) 1 % GEL Apply 4 g topically 4 (four) times daily. (Patient not taking: Reported on 10/26/2016) 100 g 0  . dicyclomine (BENTYL) 20 MG tablet Take 1 tablet (20 mg total) by mouth 2 (two) times daily. (Patient not taking: Reported on 10/26/2016) 20 tablet 0  . hydrALAZINE (APRESOLINE) 50 MG tablet take 1 and 1/2 tablets by mouth three times a day (Patient not taking: Reported on 12/25/2016) 270 tablet 3  . lisinopril (PRINIVIL,ZESTRIL) 5 MG tablet Take 1 tablet (5 mg total) by mouth daily. (Patient not taking: Reported on 12/11/2016) 90 tablet 2  . metroNIDAZOLE (FLAGYL) 500 MG tablet Take 1 tablet (500 mg total) by mouth 2 (two) times daily. (Patient not taking: Reported on 12/25/2016) 20 tablet 0  . misoprostol (CYTOTEC) 200 MCG tablet Take 3 tablets (600 mcg total) by mouth once. (Patient not taking: Reported on 12/11/2016) 3 tablet 0  . spironolactone (ALDACTONE) 25 MG tablet Take 1 tablet (25 mg total) by mouth daily. (Patient not taking: Reported on 12/07/2016) 90 tablet 3  . traMADol (ULTRAM) 50 MG tablet Take 1 tablet (50 mg total) by mouth every 6 (six) hours as needed. (Patient not taking: Reported on 12/25/2016) 10 tablet 0  . Witch Hazel (TUCKS) 50 % PADS As directed (Patient not taking: Reported on 10/26/2016) 40 each 0   No current facility-administered medications for this visit.     Review of Systems Review of Systems  Blood pressure (!) 152/102, pulse (!) 120, weight 243 lb 1.6 oz (110.3 kg), last menstrual period 05/10/2013.  Physical Exam Physical Exam  Breathing, conversing, and ambulating normally Abd- benign EG, vagina- atrophy noted No masses or tenderness with bimanual exam Pap smear obtained  Data Reviewed IMPRESSION: Sigmoid  diverticulitis without abscess or perforation.  Atherosclerosis.  Small fat containing umbilical hernia.  Chronic right hydrosalpinx.   Assessment    Lower back pain- reassurance given Preventative care-    Plan    Pap smear with cotesting today Flu vaccine today       Shamela Haydon C Marykate Heuberger 12/25/2016, 10:13 AM

## 2016-12-26 LAB — CYTOLOGY - PAP
Diagnosis: NEGATIVE
HPV (WINDOPATH): NOT DETECTED

## 2017-01-04 ENCOUNTER — Ambulatory Visit (INDEPENDENT_AMBULATORY_CARE_PROVIDER_SITE_OTHER): Payer: Medicare HMO

## 2017-01-04 ENCOUNTER — Other Ambulatory Visit (HOSPITAL_COMMUNITY)
Admission: RE | Admit: 2017-01-04 | Discharge: 2017-01-04 | Disposition: A | Discharge status: Home / Self Care | Payer: Medicare HMO | Source: Ambulatory Visit | Attending: Student | Admitting: Student

## 2017-01-04 DIAGNOSIS — R8290 Unspecified abnormal findings in urine: Secondary | ICD-10-CM

## 2017-01-04 DIAGNOSIS — N898 Other specified noninflammatory disorders of vagina: Secondary | ICD-10-CM | POA: Diagnosis present

## 2017-01-04 LAB — POCT URINALYSIS DIP (DEVICE)
BILIRUBIN URINE: NEGATIVE
Glucose, UA: NEGATIVE mg/dL
KETONES UR: NEGATIVE mg/dL
NITRITE: NEGATIVE
PH: 5.5 (ref 5.0–8.0)
Protein, ur: 100 mg/dL — AB
SPECIFIC GRAVITY, URINE: 1.02 (ref 1.005–1.030)
Urobilinogen, UA: 0.2 mg/dL (ref 0.0–1.0)

## 2017-01-04 NOTE — Progress Notes (Signed)
Pt here today for questionable UTI.  Pt reports leg and back pain.  UA resulted in trace of blood will send urine for culture.  Pt reports having vaginal discharge with irriation.  Pt requested to self-swab.  Pt advised that we will send off for urine culture and wet prep.  Pt also informed to please give us to Monday for results.  Pt stated understanding.

## 2017-01-04 NOTE — Progress Notes (Signed)
Chart reviewed for nurse visit. Agree with plan of care.   Steel Kerney Lorraine, CNM 01/04/2017 5:07 PM   

## 2017-01-05 LAB — CERVICOVAGINAL ANCILLARY ONLY
BACTERIAL VAGINITIS: POSITIVE — AB
CANDIDA VAGINITIS: NEGATIVE
Chlamydia: NEGATIVE
Neisseria Gonorrhea: NEGATIVE
Trichomonas: NEGATIVE

## 2017-01-06 LAB — URINE CULTURE

## 2017-01-08 ENCOUNTER — Encounter: Payer: Self-pay | Admitting: General Practice

## 2017-01-08 ENCOUNTER — Other Ambulatory Visit: Payer: Self-pay | Admitting: Student

## 2017-01-08 DIAGNOSIS — N3001 Acute cystitis with hematuria: Secondary | ICD-10-CM

## 2017-01-08 DIAGNOSIS — B9689 Other specified bacterial agents as the cause of diseases classified elsewhere: Secondary | ICD-10-CM

## 2017-01-08 DIAGNOSIS — N760 Acute vaginitis: Principal | ICD-10-CM

## 2017-01-08 MED ORDER — CEPHALEXIN 500 MG PO CAPS: 500.0000 mg | ORAL_CAPSULE | Freq: Three times a day (TID) | ORAL | 0 refills | Status: AC

## 2017-01-08 MED ORDER — METRONIDAZOLE 500 MG PO TABS: 500.0000 mg | ORAL_TABLET | Freq: Two times a day (BID) | ORAL | 0 refills | Status: DC

## 2017-01-08 NOTE — Progress Notes (Signed)
Patient came by office reporting abdominal pain. Informed patient of UTI & BV and antibiotics sent to pharmacy. Patient verbalized understanding and requests to see a provider. Discussed results with patient again as that could be the source of her pain. Patient states she wants to be checked out by the doctor because some stuff has changed since then. Encouraged patient to make an appt with the doctor if that's what she would like. Patient verbalized understanding & had no other questions.

## 2017-01-15 ENCOUNTER — Ambulatory Visit (INDEPENDENT_AMBULATORY_CARE_PROVIDER_SITE_OTHER): Payer: Medicare HMO | Admitting: Obstetrics & Gynecology

## 2017-01-15 ENCOUNTER — Encounter: Payer: Self-pay | Admitting: Obstetrics & Gynecology

## 2017-01-15 VITALS — BP 155/90 | HR 92 | Ht 68.0 in | Wt 247.0 lb

## 2017-01-15 DIAGNOSIS — N390 Urinary tract infection, site not specified: Secondary | ICD-10-CM | POA: Diagnosis not present

## 2017-01-15 DIAGNOSIS — R319 Hematuria, unspecified: Secondary | ICD-10-CM | POA: Diagnosis not present

## 2017-01-15 LAB — POCT URINALYSIS DIP (DEVICE)
Bilirubin Urine: NEGATIVE
Glucose, UA: NEGATIVE mg/dL
Hgb urine dipstick: NEGATIVE
Nitrite: POSITIVE — AB
Protein, ur: 100 mg/dL — AB
Specific Gravity, Urine: 1.02 (ref 1.005–1.030)
Urobilinogen, UA: 0.2 mg/dL (ref 0.0–1.0)
pH: 6 (ref 5.0–8.0)

## 2017-01-15 MED ORDER — CEFTRIAXONE SODIUM 500 MG IJ SOLR
500.0000 mg | Freq: Once | INTRAMUSCULAR | Status: AC
  Administered 2017-01-15: 250 mg via INTRAMUSCULAR

## 2017-01-15 NOTE — Progress Notes (Signed)
Patient ID: Erin Riley, female   DOB: 01/27/1951, 66 y.o.   MRN: [id number]  Chief Complaint  Patient presents with  . Pelvic Pain    HPI Erin LoraMae H Cadavid is a 66 y.o. female.   HPI 66 yo here because she is "still having pain". She was treated for a Klebsiella UTI, took augmentin.  Past Medical History:  Diagnosis Date  . Chronic back pain   . Chronic kidney disease (CKD), stage II (mild)    Class I-II  . Coronary artery disease 04/2009   50% stenosis in the perforator of LAD; catheterization was for an abnormal Myoview in January 2000 showing anterior and inferolateral ischemia.  [name]. Heart murmur    Previously reported moderate to severe MR. Most recent echo in May 2014 revealed mild MR.  [name]. 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 At least 3 subsequent echocardiograms have shown improved/normal EF with moderate to severe concentric LVH and diastolic dysfunction with LVOT/intracavitary gradient  . History of schizophrenia    However I am not sure about the validity of this. She is not on any medications.  . Hyperlipidemia   . Hypertension   . Hypertensive hypertrophic cardiomyopathy: NYHA class II:  Echo: Severe concentric LVH with LV OT gradient; essentially preserved EF with diastolic dysfunction 02/15/2013   Not significantly symptomatic; echocardiogram from May 2014: Severe concentric LVH with EF 50-55%. Somewhat unbelievably grade 1 diastolic dysfunction with moderate LE dilation. Trivial MR noted.  . Left ventricular diastolic dysfunction, NYHA class 1 March 20   Severe concentric LVH (likely hypertensive), mild aortic sclerosis. Grade 1 diastolic dysfunction  . PAD (peripheral artery disease) [facility](HCC) March 2013   Lower extremity Dopplers: R. SFA 50-60%, R. PTA proximally occluded with distal reconstitution;; L. common iliac ~50%, L. SFA 50-70% stenosis, L. PTA <  50%  . Schizophrenia [facility](HCC)     Past Surgical History:  Procedure Laterality Date  . BUNIONECTOMY    . CARDIAC CATHETERIZATION  04/09/2009   rt and lt cath -non obstructive coronary disease   . carotid doppler  05/29/2011   left bulb/prox ICA moderate amtfibrous plaque with no evidence significant reduction.,right bulb /proximal ICA normal patency  . DOPPLER ECHOCARDIOGRAPHY  May 2014   EF 50-55%; severe concentric LVH; only grade 1 diastolic dysfunction. Mild aortic sclerosis - with LVOT /intracavitary gradient of roughly 20 mmHg mean. Mild to moderately dilated LA;; previously reported MR not seen  . DOPPLER ECHOCARDIOGRAPHY  02/02/2009   mod to severe regurg mitral, EF 20 TO 25%, tricuspid severe regurg. ,pulmonic moderate regurg.  . lower extremity doppler  05/29/2011   right SFA 50% to 59% diameter reduction,right posterior tibal atreery occlusive disease,reconstituting distally, left common illiac<50%,left SFA 50 to70%,left post. tibial <50%  . NM MYOCAR PERF WALL MOTION  03/2009   Persantine; EF 51%-both anterior and inferolateral ischemia    Family History  Problem Relation Age of Onset  . Hypertension Mother   . Breast cancer Neg Hx     Social History Social History   Tobacco Use  . Smoking status: Former Smoker    Last attempt to quit: 05/14/2002    Years since quitting: 14.6  . Smokeless tobacco: Never Used  Substance Use Topics  . Alcohol use: No  . Drug use: No    No Known Allergies  Current Outpatient Medications  Medication Sig Dispense Refill  . atorvastatin (  LIPITOR) 40 MG tablet Take 1 tablet (40 mg total) by mouth daily. 90 tablet 3  . carvedilol (COREG) 25 MG tablet take 1 tablet by mouth twice a day with meals 60 tablet 9  . hydrALAZINE (APRESOLINE) 50 MG tablet take 1 and 1/2 tablets by mouth three times a day 270 tablet 3  . isosorbide mononitrate (IMDUR) 60 MG 24 hr tablet take 1 and 1/2 tablets by mouth once daily 45 tablet 2  . Multiple Vitamin  (MULTIVITAMIN) capsule Take 1 capsule daily by mouth.     No current facility-administered medications for this visit.     Review of Systems Review of Systems  Blood pressure (!) 155/90, pulse 92, height 5\' 8"  (1.727 m), weight 247 lb (112 kg), last menstrual period 05/10/2013.  Physical Exam Physical Exam Breathing and ambulating normally She has a conversation normally, but most of what she says is not factual (denies having schizophrenia, kidney disease, etc) Well nourished, well hydrated Black female, no apparent distress Back- no CVAT Speculum exam normal   Data Reviewed Collected: 01/04/17 1434  Resulting lab: [id number]  Value: Klebsiella pneumoniae Abnormal   Comment: Greater than 100,000 colony forming units per mL  Cefazolin <=4 ug/mL  Cefazolin with an MIC <=16 predicts susceptibility to the oral agents  cefaclor, cefdinir, cefpodoxime, cefprozil, cefuroxime, cephalexin,  and loracarbef when used for therapy of uncomplicated urinary tract  infections due to E. coli, Klebsiella pneumoniae, and Proteus  mirabilis.      Assessment    Continued UTI  schizophrenia  Plan    Treat with rocephin 500mg  She refuses to see Asher MuirJamie today, declines to call her psychiatrist Denies that she is any harm to herself or others        Allie BossierMyra C Daimien Patmon 01/15/2017, 8:08 AM

## 2017-01-20 ENCOUNTER — Encounter (HOSPITAL_COMMUNITY): Payer: Self-pay

## 2017-01-20 ENCOUNTER — Emergency Department (HOSPITAL_COMMUNITY): Payer: Medicare HMO

## 2017-01-20 ENCOUNTER — Emergency Department (HOSPITAL_COMMUNITY)
Admission: EM | Admit: 2017-01-20 | Discharge: 2017-01-20 | Disposition: A | Discharge status: Home / Self Care | Payer: Medicare HMO | Attending: Emergency Medicine | Admitting: Emergency Medicine

## 2017-01-20 ENCOUNTER — Other Ambulatory Visit: Payer: Self-pay

## 2017-01-20 DIAGNOSIS — I129 Hypertensive chronic kidney disease with stage 1 through stage 4 chronic kidney disease, or unspecified chronic kidney disease: Secondary | ICD-10-CM | POA: Diagnosis not present

## 2017-01-20 DIAGNOSIS — F209 Schizophrenia, unspecified: Secondary | ICD-10-CM | POA: Insufficient documentation

## 2017-01-20 DIAGNOSIS — Z79899 Other long term (current) drug therapy: Secondary | ICD-10-CM | POA: Diagnosis not present

## 2017-01-20 DIAGNOSIS — Z87891 Personal history of nicotine dependence: Secondary | ICD-10-CM | POA: Insufficient documentation

## 2017-01-20 DIAGNOSIS — N183 Chronic kidney disease, stage 3 (moderate): Secondary | ICD-10-CM | POA: Insufficient documentation

## 2017-01-20 DIAGNOSIS — I2510 Atherosclerotic heart disease of native coronary artery without angina pectoris: Secondary | ICD-10-CM | POA: Diagnosis not present

## 2017-01-20 DIAGNOSIS — R1030 Lower abdominal pain, unspecified: Secondary | ICD-10-CM | POA: Diagnosis present

## 2017-01-20 DIAGNOSIS — K5900 Constipation, unspecified: Secondary | ICD-10-CM | POA: Insufficient documentation

## 2017-01-20 DIAGNOSIS — R109 Unspecified abdominal pain: Secondary | ICD-10-CM

## 2017-01-20 LAB — URINALYSIS, ROUTINE W REFLEX MICROSCOPIC
BACTERIA UA: NONE SEEN
Bilirubin Urine: NEGATIVE
Glucose, UA: NEGATIVE mg/dL
Hgb urine dipstick: NEGATIVE
Ketones, ur: NEGATIVE mg/dL
NITRITE: NEGATIVE
Protein, ur: 30 mg/dL — AB
SPECIFIC GRAVITY, URINE: 1.017 (ref 1.005–1.030)
pH: 5 (ref 5.0–8.0)

## 2017-01-20 LAB — COMPREHENSIVE METABOLIC PANEL
ALBUMIN: 3.8 g/dL (ref 3.5–5.0)
ALT: 16 U/L (ref 14–54)
ANION GAP: 6 (ref 5–15)
AST: 22 U/L (ref 15–41)
Alkaline Phosphatase: 85 U/L (ref 38–126)
BILIRUBIN TOTAL: 0.6 mg/dL (ref 0.3–1.2)
BUN: 14 mg/dL (ref 6–20)
CALCIUM: 9.4 mg/dL (ref 8.9–10.3)
CO2: 25 mmol/L (ref 22–32)
Chloride: 103 mmol/L (ref 101–111)
Creatinine, Ser: 1.4 mg/dL — ABNORMAL HIGH (ref 0.44–1.00)
GFR, EST AFRICAN AMERICAN: 45 mL/min — AB (ref >60)
GFR, EST NON AFRICAN AMERICAN: 38 mL/min — AB (ref >60)
Glucose, Bld: 103 mg/dL — ABNORMAL HIGH (ref 65–99)
POTASSIUM: 4.3 mmol/L (ref 3.5–5.1)
SODIUM: 134 mmol/L — AB (ref 135–145)
TOTAL PROTEIN: 7.6 g/dL (ref 6.5–8.1)

## 2017-01-20 LAB — CBC
HCT: 42 % (ref 36.0–46.0)
HEMOGLOBIN: 13.9 g/dL (ref 12.0–15.0)
MCH: 30.7 pg (ref 26.0–34.0)
MCHC: 33.1 g/dL (ref 30.0–36.0)
MCV: 92.7 fL (ref 78.0–100.0)
Platelets: 318 10*3/uL (ref 150–400)
RBC: 4.53 MIL/uL (ref 3.87–5.11)
RDW: 13.6 % (ref 11.5–15.5)
WBC: 8.7 10*3/uL (ref 4.0–10.5)

## 2017-01-20 LAB — LIPASE, BLOOD: Lipase: 40 U/L (ref 11–51)

## 2017-01-20 MED ORDER — POLYETHYLENE GLYCOL 3350 17 G PO PACK: 17.0000 g | PACK | Freq: Every day | ORAL | 0 refills | Status: DC

## 2017-01-20 MED ORDER — OXYCODONE-ACETAMINOPHEN 5-325 MG PO TABS
1.0000 | ORAL_TABLET | ORAL | Status: DC | PRN
  Administered 2017-01-20: 1 via ORAL
  Filled 2017-01-20: qty 1

## 2017-01-20 NOTE — ED Triage Notes (Signed)
Pt arrived POV c/o lower abdominal pain that started today denies N/V/D
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
## 2017-01-22 ENCOUNTER — Telehealth: Payer: Self-pay | Admitting: General Practice

## 2017-01-22 NOTE — Telephone Encounter (Signed)
Patient called and left message requesting call back.

## 2017-01-29 ENCOUNTER — Other Ambulatory Visit (HOSPITAL_COMMUNITY): Payer: Self-pay | Admitting: Obstetrics & Gynecology

## 2017-01-29 DIAGNOSIS — N6489 Other specified disorders of breast: Secondary | ICD-10-CM

## 2017-01-29 NOTE — Telephone Encounter (Addendum)
I called Erin Riley and she states she wants to schedule the breast exam. States she has gotten 3 letters stating they want to see her, but can't find letters. Per chart review it appears she Erin Riley appointments for DX and breast wire.  I conference called [facility] to assist Erin Riley with rescheduling . They state she refused the biopsy.  Patient states she  Erin FlesherWent to another doctor and they couldn't find anything to biopsy and she wants to come back and get rechecked.  Per discussion with [facility] will reschedule for follow up in 3 months which will be January for left diagnostic mammogram and left breast ultrasound per recommendation from radiologist.

## 2017-02-04 ENCOUNTER — Other Ambulatory Visit: Payer: Self-pay

## 2017-02-04 ENCOUNTER — Encounter (HOSPITAL_COMMUNITY): Payer: Self-pay

## 2017-02-04 DIAGNOSIS — I129 Hypertensive chronic kidney disease with stage 1 through stage 4 chronic kidney disease, or unspecified chronic kidney disease: Secondary | ICD-10-CM | POA: Insufficient documentation

## 2017-02-04 DIAGNOSIS — Z79899 Other long term (current) drug therapy: Secondary | ICD-10-CM | POA: Diagnosis not present

## 2017-02-04 DIAGNOSIS — I2510 Atherosclerotic heart disease of native coronary artery without angina pectoris: Secondary | ICD-10-CM | POA: Diagnosis not present

## 2017-02-04 DIAGNOSIS — N183 Chronic kidney disease, stage 3 (moderate): Secondary | ICD-10-CM | POA: Insufficient documentation

## 2017-02-04 DIAGNOSIS — Z87891 Personal history of nicotine dependence: Secondary | ICD-10-CM | POA: Diagnosis not present

## 2017-02-04 DIAGNOSIS — R1030 Lower abdominal pain, unspecified: Secondary | ICD-10-CM | POA: Diagnosis present

## 2017-02-04 LAB — CBC
HCT: 41.6 % (ref 36.0–46.0)
HEMOGLOBIN: 13.9 g/dL (ref 12.0–15.0)
MCH: 31.1 pg (ref 26.0–34.0)
MCHC: 33.4 g/dL (ref 30.0–36.0)
MCV: 93.1 fL (ref 78.0–100.0)
PLATELETS: 298 10*3/uL (ref 150–400)
RBC: 4.47 MIL/uL (ref 3.87–5.11)
RDW: 13.9 % (ref 11.5–15.5)
WBC: 7.1 10*3/uL (ref 4.0–10.5)

## 2017-02-04 LAB — URINALYSIS, ROUTINE W REFLEX MICROSCOPIC
BILIRUBIN URINE: NEGATIVE
GLUCOSE, UA: NEGATIVE mg/dL
HGB URINE DIPSTICK: NEGATIVE
Ketones, ur: NEGATIVE mg/dL
Nitrite: NEGATIVE
PH: 6 (ref 5.0–8.0)
PROTEIN: 100 mg/dL — AB
Specific Gravity, Urine: 1.012 (ref 1.005–1.030)

## 2017-02-04 LAB — COMPREHENSIVE METABOLIC PANEL
ALT: 19 U/L (ref 14–54)
ANION GAP: 7 (ref 5–15)
AST: 22 U/L (ref 15–41)
Albumin: 3.7 g/dL (ref 3.5–5.0)
Alkaline Phosphatase: 81 U/L (ref 38–126)
BUN: 11 mg/dL (ref 6–20)
CALCIUM: 9.7 mg/dL (ref 8.9–10.3)
CHLORIDE: 105 mmol/L (ref 101–111)
CO2: 26 mmol/L (ref 22–32)
Creatinine, Ser: 1.07 mg/dL — ABNORMAL HIGH (ref 0.44–1.00)
GFR, EST NON AFRICAN AMERICAN: 53 mL/min — AB (ref >60)
Glucose, Bld: 100 mg/dL — ABNORMAL HIGH (ref 65–99)
Potassium: 3.6 mmol/L (ref 3.5–5.1)
SODIUM: 138 mmol/L (ref 135–145)
Total Bilirubin: 0.5 mg/dL (ref 0.3–1.2)
Total Protein: 7.9 g/dL (ref 6.5–8.1)

## 2017-02-04 LAB — LIPASE, BLOOD: LIPASE: 34 U/L (ref 11–51)

## 2017-02-04 NOTE — ED Triage Notes (Addendum)
Patient complains of 2 days of abdominal and back pain, complains of urinary frequency with same. Patient continues to state I am having contractions, NAD. Patient states that she took her Bp meds this am. Alert and oriented. Appears in no distress

## 2017-02-05 ENCOUNTER — Emergency Department (HOSPITAL_COMMUNITY)
Admission: EM | Admit: 2017-02-05 | Discharge: 2017-02-05 | Disposition: A | Discharge status: Home / Self Care | Payer: Medicare HMO | Attending: Emergency Medicine | Admitting: Emergency Medicine

## 2017-02-05 ENCOUNTER — Emergency Department (HOSPITAL_COMMUNITY): Payer: Medicare HMO

## 2017-02-05 DIAGNOSIS — N3000 Acute cystitis without hematuria: Secondary | ICD-10-CM

## 2017-02-05 MED ORDER — IOPAMIDOL (ISOVUE-300) INJECTION 61%
INTRAVENOUS | Status: AC
  Administered 2017-02-05: 100 mL
  Filled 2017-02-05: qty 100

## 2017-02-05 MED ORDER — CEPHALEXIN 500 MG PO CAPS: 500.0000 mg | ORAL_CAPSULE | Freq: Four times a day (QID) | ORAL | 0 refills | Status: DC

## 2017-02-05 MED ORDER — SODIUM CHLORIDE 0.9 % IV SOLN
INTRAVENOUS | Status: DC
  Administered 2017-02-05: 05:00:00 via INTRAVENOUS

## 2017-02-05 MED ORDER — ONDANSETRON HCL 4 MG/2ML IJ SOLN
4.0000 mg | Freq: Once | INTRAMUSCULAR | Status: AC
  Administered 2017-02-05: 4 mg via INTRAVENOUS
  Filled 2017-02-05: qty 2

## 2017-02-05 MED ORDER — HYDROMORPHONE HCL 1 MG/ML IJ SOLN
1.0000 mg | Freq: Once | INTRAMUSCULAR | Status: AC
  Administered 2017-02-05: 1 mg via INTRAVENOUS
  Filled 2017-02-05: qty 1

## 2017-02-05 MED ORDER — DEXTROSE 5 % IV SOLN
1.0000 g | Freq: Once | INTRAVENOUS | Status: AC
  Administered 2017-02-05: 1 g via INTRAVENOUS
  Filled 2017-02-05: qty 10

## 2017-02-05 MED ORDER — ISOSORBIDE MONONITRATE ER 30 MG PO TB24: 90.0000 mg | ORAL_TABLET | Freq: Every day | ORAL | Status: DC

## 2017-02-05 MED ORDER — METOCLOPRAMIDE HCL 5 MG/ML IJ SOLN
10.0000 mg | Freq: Once | INTRAMUSCULAR | Status: AC
  Administered 2017-02-05: 10 mg via INTRAVENOUS
  Filled 2017-02-05: qty 2

## 2017-02-05 MED ORDER — MORPHINE SULFATE (PF) 4 MG/ML IV SOLN
4.0000 mg | Freq: Once | INTRAVENOUS | Status: AC
  Administered 2017-02-05: 4 mg via INTRAVENOUS
  Filled 2017-02-05: qty 1

## 2017-02-05 NOTE — ED Notes (Signed)
ED Provider at bedside. 

## 2017-02-05 NOTE — ED Provider Notes (Signed)
I received the patient in signout from Dr. Anitra LauthPlunkett, briefly the patient is a 66 year old female with crampy lower abdominal pain.  She has had multiple recent urinary tract infections.  There is some concern that her urine today was infected as well.  Was given Rocephin.  The patient has some chronic low back pain that is somewhat worsened.  She was given pain medicines.  Plan is to reassess to make sure the patient can tolerate p.o.  On my reexamination the patient is still having some low back pain.  Her nausea is much better.  As her imaging has been unremarkable I will discharge the patient and let her follow-up with her family physician.  8:52 AM:  I have discussed the diagnosis/risks/treatment options with the patient and believe the pt to be eligible for discharge home to follow-up with PCP. We also discussed returning to the ED immediately if new or worsening sx occur. We discussed the sx which are most concerning (e.g., sudden worsening pain, fever, inability to tolerate by mouth) that necessitate immediate return. Medications administered to the patient during their visit and any new prescriptions provided to the patient are listed below.  Medications given during this visit Medications  0.9 %  sodium chloride infusion ( Intravenous New Bag/Given 02/05/17 0459)  isosorbide mononitrate (IMDUR) 24 hr tablet 90 mg (not administered)  morphine 4 MG/ML injection 4 mg (4 mg Intravenous Given 02/05/17 0456)  ondansetron (ZOFRAN) injection 4 mg (4 mg Intravenous Given 02/05/17 0456)  iopamidol (ISOVUE-300) 61 % injection (100 mLs  Contrast Given 02/05/17 0516)  HYDROmorphone (DILAUDID) injection 1 mg (1 mg Intravenous Given 02/05/17 0606)  cefTRIAXone (ROCEPHIN) 1 g in dextrose 5 % 50 mL IVPB (0 g Intravenous Stopped 02/05/17 0727)  metoCLOPramide (REGLAN) injection 10 mg (10 mg Intravenous Given 02/05/17 0727)     The patient appears reasonably screen and/or stabilized for discharge and I doubt any  other medical condition or other [facility] requiring further screening, evaluation, or treatment in the ED at this time prior to discharge.     Melene PlanFloyd, Durga Saldarriaga, DO 02/05/17 [id number]
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
## 2017-02-05 NOTE — ED Notes (Signed)
Patient transported to CT 

## 2017-02-06 LAB — URINE CULTURE

## 2017-02-20 ENCOUNTER — Inpatient Hospital Stay (HOSPITAL_COMMUNITY)
Admission: AD | Admit: 2017-02-20 | Discharge: 2017-02-20 | Disposition: A | Discharge status: Home / Self Care | Payer: Medicare HMO | Source: Ambulatory Visit | Attending: Family Medicine | Admitting: Family Medicine

## 2017-02-20 ENCOUNTER — Other Ambulatory Visit: Payer: Self-pay

## 2017-02-20 DIAGNOSIS — Z3202 Encounter for pregnancy test, result negative: Secondary | ICD-10-CM | POA: Diagnosis not present

## 2017-02-20 DIAGNOSIS — F209 Schizophrenia, unspecified: Secondary | ICD-10-CM | POA: Diagnosis not present

## 2017-02-20 DIAGNOSIS — N912 Amenorrhea, unspecified: Secondary | ICD-10-CM | POA: Insufficient documentation

## 2017-02-20 DIAGNOSIS — Z87891 Personal history of nicotine dependence: Secondary | ICD-10-CM | POA: Diagnosis not present

## 2017-02-20 DIAGNOSIS — I10 Essential (primary) hypertension: Secondary | ICD-10-CM | POA: Diagnosis not present

## 2017-02-20 DIAGNOSIS — I129 Hypertensive chronic kidney disease with stage 1 through stage 4 chronic kidney disease, or unspecified chronic kidney disease: Secondary | ICD-10-CM | POA: Diagnosis not present

## 2017-02-20 DIAGNOSIS — F458 Other somatoform disorders: Secondary | ICD-10-CM

## 2017-02-20 LAB — URINALYSIS, ROUTINE W REFLEX MICROSCOPIC
BILIRUBIN URINE: NEGATIVE
GLUCOSE, UA: NEGATIVE mg/dL
Hgb urine dipstick: NEGATIVE
KETONES UR: NEGATIVE mg/dL
Leukocytes, UA: NEGATIVE
Nitrite: NEGATIVE
PH: 5 (ref 5.0–8.0)
Protein, ur: 100 mg/dL — AB
SPECIFIC GRAVITY, URINE: 1.021 (ref 1.005–1.030)

## 2017-02-20 LAB — POCT PREGNANCY, URINE: Preg Test, Ur: NEGATIVE

## 2017-02-20 NOTE — MAU Note (Signed)
Pt presents with request to checked for a yeast infection, drug testing, and have pregnancy checked for dating.  Pt reports she got pregnant between February through April.  States she was seen for prenatal care 3-4 weeks ago and drugs were found in her urine, states was seen around the corner in the basement.

## 2017-02-20 NOTE — Discharge Instructions (Signed)
Pregnancy Test Information °What is a pregnancy test? °A pregnancy test is used to detect the presence of human chorionic gonadotropin (hCG) in a sample of your urine or blood. hCG is a hormone produced by the cells of the placenta. The placenta is the organ that forms to nourish and support a developing baby. °This test requires a sample of either blood or urine. A pregnancy test determines whether you are pregnant or not. °How are pregnancy tests done? °Pregnancy tests are done using a home pregnancy test or having a blood or urine test done at your health care provider's office. °Home pregnancy tests require a urine sample. °· Most kits use a plastic testing device with a strip of paper that indicates whether there is hCG in your urine. °· Follow the test instructions very carefully. °· After you urinate on the test stick, markings will appear to let you know whether you are pregnant. °· For best results, use your first urine of the morning. That is when the concentration of hCG is highest. ° °Having a blood test to check for pregnancy requires a sample of blood drawn from a vein in your hand or arm. Your health care provider will send your sample to a lab for testing. Results of a pregnancy test will be positive or negative. °Is one type of pregnancy test better than another? °In some cases, a blood test will return a positive result even if a urine test was negative because blood tests are more sensitive. This means blood tests can detect hCG earlier than home pregnancy tests. °How accurate are home pregnancy tests? °Both types of pregnancy tests are very accurate. °· A blood test is about 98% accurate. °· When you are far enough along in your pregnancy and when used correctly, home pregnancy tests are equally accurate. ° °Can anything interfere with home pregnancy test results °It is possible for certain conditions to cause an inaccurate test result (false positive or false negative). °· A false positive is a  positive test result when you are not pregnant. This can happen if you: °? Are taking certain medicines, including anticonvulsants or tranquilizers. °? Have certain proteins in your blood. °· A false negative is a negative test result when you are pregnant. This can happen if you: °? Took the test before there was enough hCG to detect. A pregnancy test will not be positive in most women until 3-4 weeks after conception. °? Drank a lot of liquid before the test. Diluted urine samples can sometimes give an inaccurate result. °? Take certain medicines, such as water pills (diuretics) or some antihistamines. ° °What should I do if I have a positive pregnancy test? °If you have a positive pregnancy test, schedule an appointment with your health care provider. You might need additional testing to confirm the pregnancy. In the meantime, begin taking a prenatal vitamin, stop smoking, stop drinking alcohol, and do not use street drugs. °Talk to your health care provider about how to take care of yourself during your pregnancy. Ask about what to expect from the care you will need throughout pregnancy (prenatal care). °This information is not intended to replace advice given to you by your health care provider. Make sure you discuss any questions you have with your health care provider. °Document Released: 02/23/2003 Document Revised: 01/18/2016 Document Reviewed: 06/17/2013 °Elsevier Interactive Patient Education © 2017 [employer] ° °

## 2017-02-20 NOTE — MAU Provider Note (Signed)
Chief Complaint: Amenorrhea   First Provider Initiated Contact with Patient 02/20/17 1722     SUBJECTIVE HPI: Erin Riley is a 66 y.o. G3P3003 female who presents to [facility] reporting being pregnant since some time btw February and April and wanting to get "checked out". Also wants to get drug tested because she states drugs were found in her urine. (On review of Epic multiple neg UDS's. One pos for opiates in 2014.)  Also reported yeast infection. Is a patient [facility]. Post-menopausal per office note 12/07/16.   Very difficult to obtain Hx due to pt's mental illness. Pt insistent that she has proof that she is pregnant.   Associated signs and symptoms: neg for abd pain or VB.   PMH pos for HTN, Schizophrenia. Several previous reports of pregnancy per Epic Notes.   Past Medical History:  Diagnosis Date  . Chronic back pain   . Chronic kidney disease (CKD), stage II (mild)    Class I-II  . Coronary artery disease 04/2009   50% stenosis in the perforator of LAD; catheterization was for an abnormal Myoview in January 2000 showing anterior and inferolateral ischemia.  [name]. Heart murmur    Previously reported moderate to severe MR. Most recent echo in May 2014 revealed mild MR.  [name]. 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 At least 3 subsequent echocardiograms have shown improved/normal EF with moderate to severe concentric LVH and diastolic dysfunction with LVOT/intracavitary gradient  . History of schizophrenia    However I am not sure about the validity of this. She is not on any medications.  . Hyperlipidemia   . Hypertension   . Hypertensive hypertrophic cardiomyopathy: NYHA class II:  Echo: Severe concentric LVH with LV OT gradient; essentially preserved EF with diastolic dysfunction 02/15/2013   Not significantly symptomatic; echocardiogram from May  2014: Severe concentric LVH with EF 50-55%. Somewhat unbelievably grade 1 diastolic dysfunction with moderate LE dilation. Trivial MR noted.  . Left ventricular diastolic dysfunction, NYHA class 1 March 20   Severe concentric LVH (likely hypertensive), mild aortic sclerosis. Grade 1 diastolic dysfunction  . PAD (peripheral artery disease) [facility](HCC) March 2013   Lower extremity Dopplers: R. SFA 50-60%, R. PTA proximally occluded with distal reconstitution;; L. common iliac ~50%, L. SFA 50-70% stenosis, L. PTA < 50%  . Schizophrenia (HCC)    OB History  Gravida Para Term Preterm AB Living  3 3 3     3   SAB TAB Ectopic Multiple Live Births          3    # Outcome Date GA Lbr Len/2nd Weight Sex Delivery Anes PTL Lv  3 Term      Vag-Spont     2 Term      Vag-Spont     1 Term      Vag-Spont        Past Surgical History:  Procedure Laterality Date  . BUNIONECTOMY    . CARDIAC CATHETERIZATION  04/09/2009   rt and lt cath -non obstructive coronary disease   . carotid doppler  05/29/2011   left bulb/prox ICA moderate amtfibrous plaque with no evidence significant reduction.,right bulb /proximal ICA normal patency  . DOPPLER ECHOCARDIOGRAPHY  May 2014   EF 50-55%; severe concentric LVH; only grade 1 diastolic dysfunction. Mild aortic sclerosis - with LVOT /intracavitary gradient of roughly 20 mmHg mean. Mild to moderately dilated  LA;; previously reported MR not seen  . DOPPLER ECHOCARDIOGRAPHY  02/02/2009   mod to severe regurg mitral, EF 20 TO 25%, tricuspid severe regurg. ,pulmonic moderate regurg.  . lower extremity doppler  05/29/2011   right SFA 50% to 59% diameter reduction,right posterior tibal atreery occlusive disease,reconstituting distally, left common illiac<50%,left SFA 50 to70%,left post. tibial <50%  . NM MYOCAR PERF WALL MOTION  03/2009   Persantine; EF 51%-both anterior and inferolateral ischemia   Social History   Socioeconomic History  . Marital status: Married    Spouse  name: Not on file  . Number of children: Not on file  . Years of education: Not on file  . Highest education level: Not on file  Social Needs  . Financial resource strain: Not on file  . Food insecurity - worry: Not on file  . Food insecurity - inability: Not on file  . Transportation needs - medical: Not on file  . Transportation needs - non-medical: Not on file  Occupational History  . Not on file  Tobacco Use  . Smoking status: Former Smoker    Last attempt to quit: 05/14/2002    Years since quitting: 14.7  . Smokeless tobacco: Never Used  Substance and Sexual Activity  . Alcohol use: No  . Drug use: No  . Sexual activity: Not on file  Other Topics Concern  . Not on file  Social History Narrative   Now single mother of 2 with one grandchild. She quit smoking roughly 5 years ago and is not so since. She has also stopped drinking alcohol. She does try get routine exercise walking at least a mile 3-4 days a week.    She lives with her 33 year old mother. 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   Family History  Problem Relation Age of Onset  . Hypertension Mother   . Breast cancer Neg Hx    No current facility-administered medications on file prior to encounter.    Current Outpatient Medications on File Prior to Encounter  Medication Sig Dispense Refill  . Ascorbic Acid (VITAMIN C) 100 MG tablet Take 500 mg daily by mouth.    [name] atorvastatin (LIPITOR) 40 MG tablet Take 1 tablet (40 mg total) by mouth daily. (Patient not taking: Reported on 02/05/2017) 90 tablet 3  . carvedilol (COREG) 25 MG tablet take 1 tablet by mouth twice a day with meals (Patient not taking: Reported on 02/05/2017) 60 tablet 9  . cephALEXin (KEFLEX) 500 MG capsule Take 1 capsule (500 mg total) by mouth 4 (four) times daily. 40 capsule 0  . cholecalciferol (VITAMIN D) 1000 units tablet Take 1,000 Units daily by mouth.    . hydrALAZINE (APRESOLINE) 50 MG tablet take 1 and 1/2 tablets by mouth three times a day  (Patient not taking: Reported on 01/20/2017) 270 tablet 3  . isosorbide mononitrate (IMDUR) 60 MG 24 hr tablet take 1 and 1/2 tablets by mouth once daily (Patient taking differently: take 90 mg tablets by mouth once daily) 45 tablet 2  . Multiple Vitamin (MULTIVITAMIN) capsule Take 1 capsule daily by mouth.    . polyethylene glycol (MIRALAX / GLYCOLAX) packet Take 17 g daily by mouth. 14 each 0  . spironolactone (ALDACTONE) 25 MG tablet Take 25 mg daily by mouth.  0  . traMADol (ULTRAM) 50 MG tablet Take 50 mg by mouth every 6 (six) hours as needed for moderate pain.   0   No Known Allergies  I have reviewed patient's Past Medical Hx,  Surgical Hx, Family Hx, Social Hx, medications and allergies.   Review of Systems  Unable to perform ROS: Psychiatric disorder  Respiratory: Negative for shortness of breath.   Gastrointestinal: Negative for abdominal pain.  Genitourinary: Positive for vaginal discharge. Negative for vaginal bleeding.  Neurological: Negative for headaches.    OBJECTIVE Patient Vitals for the past 24 hrs:  BP Temp Temp src Pulse Resp SpO2 Height Weight  02/20/17 1635 (!) 189/114 97.6 F (36.4 C) Oral 95 18 98 % 5\' 7"  (1.702 m) 187 lb (84.8 kg)   Constitutional: Well-developed, well-nourished female in no acute distress. No diaphoresis.  Cardiovascular: normal rate Respiratory: normal rate and effort.  GI: Deferred Neurologic: Alert and oriented x 4.  Psych: Agitated. Unable to obtain clear Hx due to perseveration on notification of neg UPT.  GU: Deferred  LAB RESULTS Results for orders placed or performed during the hospital encounter of 02/20/17 (from the past 24 hour(s))  Urinalysis, Routine w reflex microscopic     Status: Abnormal   Collection Time: 02/20/17  4:39 PM  Result Value Ref Range   Color, Urine YELLOW YELLOW   APPearance HAZY (A) CLEAR   Specific Gravity, Urine 1.021 1.005 - 1.030   pH 5.0 5.0 - 8.0   Glucose, UA NEGATIVE NEGATIVE mg/dL   Hgb  urine dipstick NEGATIVE NEGATIVE   Bilirubin Urine NEGATIVE NEGATIVE   Ketones, ur NEGATIVE NEGATIVE mg/dL   Protein, ur 161 (A) NEGATIVE mg/dL   Nitrite NEGATIVE NEGATIVE   Leukocytes, UA NEGATIVE NEGATIVE   RBC / HPF 0-5 0 - 5 RBC/hpf   WBC, UA 0-5 0 - 5 WBC/hpf   Bacteria, UA RARE (A) NONE SEEN   Squamous Epithelial / LPF 6-30 (A) NONE SEEN   Mucus PRESENT   Pregnancy, urine POC     Status: None   Collection Time: 02/20/17  4:59 PM  Result Value Ref Range   Preg Test, Ur NEGATIVE NEGATIVE    IMAGING NA  MAU COURSE/MDM Orders Placed This Encounter  Procedures  . Urinalysis, Routine w reflex microscopic  . Pregnancy, urine POC  . Discharge patient   Informed pt of neg UPT. Pt extremely insistent that she is pregnant. Pulling numerous hospital papers, hospital arm bands and receipts out of her pocketbook and stating she has proof. Asking to see our result. Shown neg result in computer. Does not believe results. Asking for another urine cup.   Asked what she needed to have checked out and patient said that people just keep giving her prescriptions and she doesn't want any more. CNM asked is she has been taking prescriptions and she says that she has been taking the important ones. CNM asked who patient lives with (wondering who helps with her medications and health problems.) Pt became very agitated at nurse and said she already told her that. Would not answer question. Asked to have results pronted out. Informe dthat she will have to request them from Medical Records or can sign up for MyChart. Activation Code given. Says she is going to the pharmacy to take her own pregnancy test and will bring it back as proof and walked out.   - Uncontrolled CHTN on Hydralazine and Coreg under care of Dr. Ronne Binning. No Chest pain, HA. Informed Dr. Adrian Blackwater that pt has known severe HTN and walked out on exam room.  ASSESSMENT 1. Pregnancy examination or test, negative result   2. Hypertension,  unspecified type   3. Schizophrenia, unspecified type (HCC)   4.  Pseudocyesis     PLAN Discharge home in stable condition. BP C/W previous ED visits. Unable to give precautions Follow-up Information    Billee CashingMcKenzie, [place], MD Follow up.   Specialty:  Family Medicine Why:  For HTN and as needed Contact information: [address] [place] [id number] [phone]          Allergies as of 02/20/2017   No Known Allergies     Medication List    TAKE these medications   atorvastatin 40 MG tablet Commonly known as:  LIPITOR Take 1 tablet (40 mg total) by mouth daily.   carvedilol 25 MG tablet Commonly known as:  COREG take 1 tablet by mouth twice a day with meals   cephALEXin 500 MG capsule Commonly known as:  KEFLEX Take 1 capsule (500 mg total) by mouth 4 (four) times daily.   cholecalciferol 1000 units tablet Commonly known as:  VITAMIN D Take 1,000 Units daily by mouth.   hydrALAZINE 50 MG tablet Commonly known as:  APRESOLINE take 1 and 1/2 tablets by mouth three times a day   isosorbide mononitrate 60 MG 24 hr tablet Commonly known as:  IMDUR take 1 and 1/2 tablets by mouth once daily What changed:    how much to take  how to take this  when to take this   multivitamin capsule Take 1 capsule daily by mouth.   polyethylene glycol packet Commonly known as:  MIRALAX / GLYCOLAX Take 17 g daily by mouth.   spironolactone 25 MG tablet Commonly known as:  ALDACTONE Take 25 mg daily by mouth.   traMADol 50 MG tablet Commonly known as:  ULTRAM Take 50 mg by mouth every 6 (six) hours as needed for moderate pain.   vitamin C 100 MG tablet Take 500 mg daily by mouth.        Katrinka BlazingSmith, [place], [place] 02/20/2017  8:04 PM

## 2017-02-21 ENCOUNTER — Other Ambulatory Visit: Payer: Self-pay

## 2017-02-21 ENCOUNTER — Emergency Department (HOSPITAL_COMMUNITY)
Admission: EM | Admit: 2017-02-21 | Discharge: 2017-02-21 | Disposition: A | Discharge status: Home / Self Care | Payer: Medicare HMO | Attending: Emergency Medicine | Admitting: Emergency Medicine

## 2017-02-21 ENCOUNTER — Encounter (HOSPITAL_COMMUNITY): Payer: Self-pay | Admitting: *Deleted

## 2017-02-21 DIAGNOSIS — R197 Diarrhea, unspecified: Secondary | ICD-10-CM | POA: Insufficient documentation

## 2017-02-21 DIAGNOSIS — Z5321 Procedure and treatment not carried out due to patient leaving prior to being seen by health care provider: Secondary | ICD-10-CM | POA: Insufficient documentation

## 2017-02-21 DIAGNOSIS — M545 Low back pain: Secondary | ICD-10-CM | POA: Diagnosis not present

## 2017-02-21 DIAGNOSIS — R1030 Lower abdominal pain, unspecified: Secondary | ICD-10-CM | POA: Diagnosis present

## 2017-02-21 DIAGNOSIS — R110 Nausea: Secondary | ICD-10-CM | POA: Insufficient documentation

## 2017-02-21 LAB — COMPREHENSIVE METABOLIC PANEL
ALBUMIN: 3.7 g/dL (ref 3.5–5.0)
ALK PHOS: 72 U/L (ref 38–126)
ALT: 18 U/L (ref 14–54)
ANION GAP: 6 (ref 5–15)
AST: 19 U/L (ref 15–41)
BILIRUBIN TOTAL: 0.6 mg/dL (ref 0.3–1.2)
BUN: 15 mg/dL (ref 6–20)
CALCIUM: 9.7 mg/dL (ref 8.9–10.3)
CO2: 23 mmol/L (ref 22–32)
CREATININE: 1.21 mg/dL — AB (ref 0.44–1.00)
Chloride: 103 mmol/L (ref 101–111)
GFR calc Af Amer: 53 mL/min — ABNORMAL LOW (ref >60)
GFR calc non Af Amer: 46 mL/min — ABNORMAL LOW (ref >60)
GLUCOSE: 108 mg/dL — AB (ref 65–99)
Potassium: 3.6 mmol/L (ref 3.5–5.1)
Sodium: 132 mmol/L — ABNORMAL LOW (ref 135–145)
TOTAL PROTEIN: 7.5 g/dL (ref 6.5–8.1)

## 2017-02-21 LAB — CBC
HCT: 41.1 % (ref 36.0–46.0)
HEMOGLOBIN: 13.5 g/dL (ref 12.0–15.0)
MCH: 30.9 pg (ref 26.0–34.0)
MCHC: 32.8 g/dL (ref 30.0–36.0)
MCV: 94.1 fL (ref 78.0–100.0)
PLATELETS: 341 10*3/uL (ref 150–400)
RBC: 4.37 MIL/uL (ref 3.87–5.11)
RDW: 14 % (ref 11.5–15.5)
WBC: 7.2 10*3/uL (ref 4.0–10.5)

## 2017-02-21 LAB — URINALYSIS, ROUTINE W REFLEX MICROSCOPIC
Bilirubin Urine: NEGATIVE
GLUCOSE, UA: NEGATIVE mg/dL
Hgb urine dipstick: NEGATIVE
Ketones, ur: NEGATIVE mg/dL
Leukocytes, UA: NEGATIVE
NITRITE: NEGATIVE
PH: 5 (ref 5.0–8.0)
Protein, ur: 100 mg/dL — AB
SPECIFIC GRAVITY, URINE: 1.023 (ref 1.005–1.030)

## 2017-02-21 LAB — LIPASE, BLOOD: Lipase: 35 U/L (ref 11–51)

## 2017-02-21 NOTE — ED Notes (Signed)
Pt called for treatment room x 2, no answer. 

## 2017-02-21 NOTE — ED Triage Notes (Signed)
Pt states she does not feel good- abdominal pain lower and lower back pain, leg problems.  Pt reports nausea and reports both diarrhea and constipation

## 2017-03-04 ENCOUNTER — Emergency Department (HOSPITAL_COMMUNITY)
Admission: EM | Admit: 2017-03-04 | Discharge: 2017-03-04 | Disposition: A | Discharge status: Home / Self Care | Payer: Medicare HMO | Attending: Emergency Medicine | Admitting: Emergency Medicine

## 2017-03-04 ENCOUNTER — Encounter (HOSPITAL_COMMUNITY): Payer: Self-pay

## 2017-03-04 ENCOUNTER — Other Ambulatory Visit: Payer: Self-pay

## 2017-03-04 DIAGNOSIS — I129 Hypertensive chronic kidney disease with stage 1 through stage 4 chronic kidney disease, or unspecified chronic kidney disease: Secondary | ICD-10-CM | POA: Diagnosis not present

## 2017-03-04 DIAGNOSIS — G8929 Other chronic pain: Secondary | ICD-10-CM | POA: Diagnosis not present

## 2017-03-04 DIAGNOSIS — Z87891 Personal history of nicotine dependence: Secondary | ICD-10-CM | POA: Diagnosis not present

## 2017-03-04 DIAGNOSIS — I2510 Atherosclerotic heart disease of native coronary artery without angina pectoris: Secondary | ICD-10-CM | POA: Insufficient documentation

## 2017-03-04 DIAGNOSIS — N183 Chronic kidney disease, stage 3 (moderate): Secondary | ICD-10-CM | POA: Diagnosis not present

## 2017-03-04 DIAGNOSIS — Z79899 Other long term (current) drug therapy: Secondary | ICD-10-CM | POA: Diagnosis not present

## 2017-03-04 DIAGNOSIS — I77819 Aortic ectasia, unspecified site: Secondary | ICD-10-CM | POA: Insufficient documentation

## 2017-03-04 DIAGNOSIS — M545 Low back pain: Secondary | ICD-10-CM | POA: Diagnosis not present

## 2017-03-04 DIAGNOSIS — M791 Myalgia, unspecified site: Secondary | ICD-10-CM | POA: Insufficient documentation

## 2017-03-04 DIAGNOSIS — R1030 Lower abdominal pain, unspecified: Secondary | ICD-10-CM | POA: Insufficient documentation

## 2017-03-04 LAB — COMPREHENSIVE METABOLIC PANEL
ALBUMIN: 3.5 g/dL (ref 3.5–5.0)
ALK PHOS: 78 U/L (ref 38–126)
ALT: 19 U/L (ref 14–54)
AST: 22 U/L (ref 15–41)
Anion gap: 3 — ABNORMAL LOW (ref 5–15)
BUN: 17 mg/dL (ref 6–20)
CALCIUM: 9.4 mg/dL (ref 8.9–10.3)
CHLORIDE: 110 mmol/L (ref 101–111)
CO2: 26 mmol/L (ref 22–32)
CREATININE: 1.13 mg/dL — AB (ref 0.44–1.00)
GFR calc non Af Amer: 50 mL/min — ABNORMAL LOW (ref >60)
GFR, EST AFRICAN AMERICAN: 58 mL/min — AB (ref >60)
GLUCOSE: 101 mg/dL — AB (ref 65–99)
Potassium: 3.6 mmol/L (ref 3.5–5.1)
SODIUM: 139 mmol/L (ref 135–145)
Total Bilirubin: 0.6 mg/dL (ref 0.3–1.2)
Total Protein: 7.2 g/dL (ref 6.5–8.1)

## 2017-03-04 LAB — CBC
HCT: 38.2 % (ref 36.0–46.0)
Hemoglobin: 12.7 g/dL (ref 12.0–15.0)
MCH: 30.9 pg (ref 26.0–34.0)
MCHC: 33.2 g/dL (ref 30.0–36.0)
MCV: 92.9 fL (ref 78.0–100.0)
PLATELETS: 300 10*3/uL (ref 150–400)
RBC: 4.11 MIL/uL (ref 3.87–5.11)
RDW: 13.9 % (ref 11.5–15.5)
WBC: 6.7 10*3/uL (ref 4.0–10.5)

## 2017-03-04 LAB — LIPASE, BLOOD: LIPASE: 40 U/L (ref 11–51)

## 2017-03-04 LAB — URINALYSIS, ROUTINE W REFLEX MICROSCOPIC
Bacteria, UA: NONE SEEN
Bilirubin Urine: NEGATIVE
GLUCOSE, UA: NEGATIVE mg/dL
Hgb urine dipstick: NEGATIVE
KETONES UR: NEGATIVE mg/dL
Leukocytes, UA: NEGATIVE
Nitrite: NEGATIVE
PH: 5 (ref 5.0–8.0)
PROTEIN: 30 mg/dL — AB
RBC / HPF: NONE SEEN RBC/hpf (ref 0–5)
Specific Gravity, Urine: 1.02 (ref 1.005–1.030)

## 2017-03-04 MED ORDER — HYDROMORPHONE HCL 1 MG/ML IJ SOLN
0.5000 mg | Freq: Once | INTRAMUSCULAR | Status: AC
  Administered 2017-03-04: 0.5 mg via INTRAVENOUS
  Filled 2017-03-04: qty 1

## 2017-03-04 NOTE — ED Provider Notes (Signed)
Medical screening examination/treatment/procedure(s) were conducted as a shared visit with non-physician practitioner(s) and myself.  I personally evaluated the patient during the encounter.  Clinical Impression:   Final diagnoses:  Lower abdominal pain  Aortic dilatation (HCC)  Chronic right-sided low back pain, with sciatica presence unspecified  Muscle pain    The pt has had recent diverticulitis stability however the CT scan that she underwent on December 3 did not show any evidence of diverticulitis.  She presented back to the hospital today with abdominal pain again.  This time she states that the pain is located in the left lower quadrant, on my exam the patient is a very soft abdomen and when I asked her about other things such as the scar on her shoulder I am able to deeply palpate diffusely through her abdomen without any tenderness or guarding.  When I asked her about her abdomen she pushes my hand away with even touching gently on her skin.  She is not tachycardic, she does have a soft systolic murmur, she has no significant edema of the legs and appears in absolutely no distress.  Lab work is totally unremarkable with no leukocytosis, her renal function is at baseline, and the urinalysis is negative for any signs of infection.  She appears stable for discharge, I do not think that she needs recurrent imaging since she just had a CT scan a month ago, she does not need antibiotics, she can be treated safely with Tylenol and supportive care.   Eber HongMiller, Maxine Fredman, MD 03/05/17 [phone]
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
## 2017-03-04 NOTE — ED Triage Notes (Signed)
Pt reports lower abd and back pain and bilateral leg pain since this morning. Denies urinary symptoms. Endorses nausea.

## 2017-03-04 NOTE — Discharge Instructions (Signed)
Please return to the ER for any worsening abdominal pain, fevers, or any new or worsening symptoms.  You may treat your lower back pain with Tylenol and ibuprofen as well as with warm and cold compresses as discussed.  Please follow-up with your primary doctor within a week to reevaluate your symptoms and also inform them of the atherosclerosis with dilatation of the intra-abdominal aorta was noted on your CT scan earlier this month.

## 2017-03-12 ENCOUNTER — Emergency Department (HOSPITAL_COMMUNITY)
Admission: EM | Admit: 2017-03-12 | Discharge: 2017-03-12 | Disposition: A | Discharge status: Home / Self Care | Payer: Medicare HMO | Attending: Emergency Medicine | Admitting: Emergency Medicine

## 2017-03-12 ENCOUNTER — Encounter: Payer: Self-pay | Admitting: Obstetrics & Gynecology

## 2017-03-12 ENCOUNTER — Other Ambulatory Visit: Payer: Self-pay

## 2017-03-12 ENCOUNTER — Ambulatory Visit (INDEPENDENT_AMBULATORY_CARE_PROVIDER_SITE_OTHER): Payer: Medicare HMO | Admitting: Obstetrics & Gynecology

## 2017-03-12 ENCOUNTER — Encounter (HOSPITAL_COMMUNITY): Payer: Self-pay

## 2017-03-12 VITALS — BP 237/117 | HR 92 | Wt 228.9 lb

## 2017-03-12 DIAGNOSIS — M545 Low back pain, unspecified: Secondary | ICD-10-CM

## 2017-03-12 DIAGNOSIS — R102 Pelvic and perineal pain: Secondary | ICD-10-CM

## 2017-03-12 DIAGNOSIS — Z5321 Procedure and treatment not carried out due to patient leaving prior to being seen by health care provider: Secondary | ICD-10-CM | POA: Diagnosis not present

## 2017-03-12 DIAGNOSIS — G8929 Other chronic pain: Secondary | ICD-10-CM

## 2017-03-12 DIAGNOSIS — R109 Unspecified abdominal pain: Secondary | ICD-10-CM | POA: Diagnosis not present

## 2017-03-12 LAB — COMPREHENSIVE METABOLIC PANEL
ALBUMIN: 3.6 g/dL (ref 3.5–5.0)
ALT: 19 U/L (ref 14–54)
ANION GAP: 7 (ref 5–15)
AST: 21 U/L (ref 15–41)
Alkaline Phosphatase: 82 U/L (ref 38–126)
BUN: 16 mg/dL (ref 6–20)
CHLORIDE: 103 mmol/L (ref 101–111)
CO2: 24 mmol/L (ref 22–32)
Calcium: 9.8 mg/dL (ref 8.9–10.3)
Creatinine, Ser: 1.03 mg/dL — ABNORMAL HIGH (ref 0.44–1.00)
GFR calc Af Amer: >60 mL/min (ref >60)
GFR calc non Af Amer: 56 mL/min — ABNORMAL LOW (ref >60)
GLUCOSE: 105 mg/dL — AB (ref 65–99)
POTASSIUM: 4 mmol/L (ref 3.5–5.1)
Sodium: 134 mmol/L — ABNORMAL LOW (ref 135–145)
Total Bilirubin: 0.8 mg/dL (ref 0.3–1.2)
Total Protein: 7.4 g/dL (ref 6.5–8.1)

## 2017-03-12 LAB — CBC
HEMATOCRIT: 43 % (ref 36.0–46.0)
HEMOGLOBIN: 14 g/dL (ref 12.0–15.0)
MCH: 30.4 pg (ref 26.0–34.0)
MCHC: 32.6 g/dL (ref 30.0–36.0)
MCV: 93.5 fL (ref 78.0–100.0)
Platelets: 297 10*3/uL (ref 150–400)
RBC: 4.6 MIL/uL (ref 3.87–5.11)
RDW: 13.4 % (ref 11.5–15.5)
WBC: 6.8 10*3/uL (ref 4.0–10.5)

## 2017-03-12 LAB — URINALYSIS, ROUTINE W REFLEX MICROSCOPIC
Bilirubin Urine: NEGATIVE
Glucose, UA: NEGATIVE mg/dL
Hgb urine dipstick: NEGATIVE
Ketones, ur: NEGATIVE mg/dL
NITRITE: NEGATIVE
PROTEIN: 30 mg/dL — AB
Specific Gravity, Urine: 1.016 (ref 1.005–1.030)
pH: 5 (ref 5.0–8.0)

## 2017-03-12 LAB — LIPASE, BLOOD: LIPASE: 39 U/L (ref 11–51)

## 2017-03-12 MED ORDER — IBUPROFEN 600 MG PO TABS: 600.0000 mg | ORAL_TABLET | Freq: Four times a day (QID) | ORAL | 1 refills | Status: DC | PRN

## 2017-03-12 NOTE — ED Notes (Signed)
Pt cannot be found no answer x 2

## 2017-03-12 NOTE — Progress Notes (Signed)
Patient ID: Erin Riley, female   DOB: [birth year], 67 y.o.   MRN: [id number]  No chief complaint on file.   HPI Erin Riley is a 67 y.o. female.  Single P3 here today with pain in her left lower quadrant and left flank pain for months. She had a CT done 02/05/17 when she went to the [facility] ED. It showed diverticulosis, chonic, stable chronic right hydrosalpinx, and a 3 cm aorta. She does not take IBU or tylenol when she has the pain, although the ER provider suggested this. HPI  Past Medical History:  Diagnosis Date  . Chronic back pain   . Chronic kidney disease (CKD), stage II (mild)    Class I-II  . Coronary artery disease 04/2009   50% stenosis in the perforator of LAD; catheterization was for an abnormal Myoview in January 2000 showing anterior and inferolateral ischemia.  [name] Heart murmur    Previously reported moderate to severe MR. Most recent echo in May 2014 revealed mild MR.  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 At least 3 subsequent echocardiograms have shown improved/normal EF with moderate to severe concentric LVH and diastolic dysfunction with LVOT/intracavitary gradient  . History of schizophrenia    However I am not sure about the validity of this. She is not on any medications.  . Hyperlipidemia   . Hypertension   . Hypertensive hypertrophic cardiomyopathy: NYHA class II:  Echo: Severe concentric LVH with LV OT gradient; essentially preserved EF with diastolic dysfunction 02/15/2013   Not significantly symptomatic; echocardiogram from May 2014: Severe concentric LVH with EF 50-55%. Somewhat unbelievably grade 1 diastolic dysfunction with moderate LE dilation. Trivial MR noted.  . Left ventricular diastolic dysfunction, NYHA class 1 March 20   Severe concentric LVH (likely hypertensive), mild aortic sclerosis. Grade 1 diastolic dysfunction  .  PAD (peripheral artery disease) [facility]) March 2013   Lower extremity Dopplers: R. SFA 50-60%, R. PTA proximally occluded with distal reconstitution;; L. common iliac ~50%, L. SFA 50-70% stenosis, L. PTA < 50%  . Schizophrenia [facility])     Past Surgical History:  Procedure Laterality Date  . BUNIONECTOMY    . CARDIAC CATHETERIZATION  04/09/2009   rt and lt cath -non obstructive coronary disease   . carotid doppler  05/29/2011   left bulb/prox ICA moderate amtfibrous plaque with no evidence significant reduction.,right bulb /proximal ICA normal patency  . DOPPLER ECHOCARDIOGRAPHY  May 2014   EF 50-55%; severe concentric LVH; only grade 1 diastolic dysfunction. Mild aortic sclerosis - with LVOT /intracavitary gradient of roughly 20 mmHg mean. Mild to moderately dilated LA;; previously reported MR not seen  . DOPPLER ECHOCARDIOGRAPHY  02/02/2009   mod to severe regurg mitral, EF 20 TO 25%, tricuspid severe regurg. ,pulmonic moderate regurg.  . lower extremity doppler  05/29/2011   right SFA 50% to 59% diameter reduction,right posterior tibal atreery occlusive disease,reconstituting distally, left common illiac<50%,left SFA 50 to70%,left post. tibial <50%  . NM MYOCAR PERF WALL MOTION  03/2009   Persantine; EF 51%-both anterior and inferolateral ischemia    Family History  Problem Relation Age of Onset  . Hypertension Mother   . Breast cancer Neg Hx     Social History Social History   Tobacco Use  . Smoking status: Current Every Day Smoker    Types: Cigarettes    Last attempt to quit: 05/14/2002  Years since quitting: 14.8  . Smokeless tobacco: Never Used  Substance Use Topics  . Alcohol use: No  . Drug use: No    No Known Allergies  Current Outpatient Medications  Medication Sig Dispense Refill  . cholecalciferol (VITAMIN D) 1000 units tablet Take 1,000 Units daily by mouth.    . hydrALAZINE (APRESOLINE) 50 MG tablet take 1 and 1/2 tablets by mouth three times a day 270 tablet  3  . isosorbide mononitrate (IMDUR) 60 MG 24 hr tablet take 1 and 1/2 tablets by mouth once daily (Patient taking differently: take 90 mg tablets by mouth once daily) 45 tablet 2  . Multiple Vitamin (MULTIVITAMIN) capsule Take 1 capsule daily by mouth.    . polyethylene glycol (MIRALAX / GLYCOLAX) packet Take 17 g daily by mouth. 14 each 0  . spironolactone (ALDACTONE) 25 MG tablet Take 25 mg daily by mouth.  0  . traMADol (ULTRAM) 50 MG tablet Take 50 mg by mouth every 6 (six) hours as needed for moderate pain.   0  . Ascorbic Acid (VITAMIN C) 100 MG tablet Take 500 mg daily by mouth.    [name]. atorvastatin (LIPITOR) 40 MG tablet Take 1 tablet (40 mg total) by mouth daily. (Patient not taking: Reported on 02/05/2017) 90 tablet 3  . carvedilol (COREG) 25 MG tablet take 1 tablet by mouth twice a day with meals (Patient not taking: Reported on 02/05/2017) 60 tablet 9  . cephALEXin (KEFLEX) 500 MG capsule Take 1 capsule (500 mg total) by mouth 4 (four) times daily. (Patient not taking: Reported on 03/12/2017) 40 capsule 0   No current facility-administered medications for this visit.     Review of Systems Review of Systems  She has been abstinent, but can't remember when.   Blood pressure (!) 237/117, pulse 92, weight 228 lb 14.4 oz (103.8 kg), last menstrual period 05/10/2013.  Physical Exam Physical Exam  Breathing, conversing, and ambulating normally Well nourished, well hydrated Black female, no apparent distress Bimanual exam with no masses or tenderness No CVAT  Data Reviewed Dx:  Preventative health care  Component 51mo ago  Adequacy Satisfactory for evaluation endocervical/transformation zone component PRESENT.   Diagnosis NEGATIVE FOR INTRAEPITHELIAL LESIONS OR MALIGNANCY.   HPV NOT DETECTED   Comment: Normal Reference Range - NOT Detected  Material Submitted CervicoVaginal Pap [ThinPrep Imaged]   CYTOLOGY - PAP PAP RESULT   Resulting Agency [place]      Specimen           Assessment    Chronic pelvic pain, reassurance given IBU prescribed Urine culture today    Plan     Very high blood pressure today She agrees to go back to the ER for this today.       Aden Youngman C Konrad Hoak 03/12/2017, 10:06 AM

## 2017-03-12 NOTE — ED Triage Notes (Signed)
Per Pt, Pt Is coming from home with left flank pain and back pain that has been going on since 12/30. Reports blood in her urine and some burning when she urinates.

## 2017-03-12 NOTE — ED Notes (Signed)
No answer to vitals. 

## 2017-03-15 ENCOUNTER — Other Ambulatory Visit: Payer: Self-pay | Admitting: Family Medicine

## 2017-03-15 ENCOUNTER — Other Ambulatory Visit: Payer: Medicare HMO

## 2017-03-15 DIAGNOSIS — Z1231 Encounter for screening mammogram for malignant neoplasm of breast: Secondary | ICD-10-CM

## 2017-03-20 ENCOUNTER — Emergency Department (HOSPITAL_COMMUNITY)
Admission: EM | Admit: 2017-03-20 | Discharge: 2017-03-20 | Disposition: A | Discharge status: Home / Self Care | Payer: Medicare HMO | Attending: Emergency Medicine | Admitting: Emergency Medicine

## 2017-03-20 ENCOUNTER — Encounter (HOSPITAL_COMMUNITY): Payer: Self-pay | Admitting: Emergency Medicine

## 2017-03-20 ENCOUNTER — Other Ambulatory Visit: Payer: Self-pay

## 2017-03-20 DIAGNOSIS — Z5321 Procedure and treatment not carried out due to patient leaving prior to being seen by health care provider: Secondary | ICD-10-CM | POA: Insufficient documentation

## 2017-03-20 DIAGNOSIS — R300 Dysuria: Secondary | ICD-10-CM | POA: Diagnosis not present

## 2017-03-20 LAB — COMPREHENSIVE METABOLIC PANEL
ALT: 17 U/L (ref 14–54)
AST: 20 U/L (ref 15–41)
Albumin: 3.7 g/dL (ref 3.5–5.0)
Alkaline Phosphatase: 81 U/L (ref 38–126)
Anion gap: 9 (ref 5–15)
BUN: 23 mg/dL — AB (ref 6–20)
CHLORIDE: 102 mmol/L (ref 101–111)
CO2: 25 mmol/L (ref 22–32)
Calcium: 9.9 mg/dL (ref 8.9–10.3)
Creatinine, Ser: 1.68 mg/dL — ABNORMAL HIGH (ref 0.44–1.00)
GFR, EST AFRICAN AMERICAN: 36 mL/min — AB (ref >60)
GFR, EST NON AFRICAN AMERICAN: 31 mL/min — AB (ref >60)
Glucose, Bld: 146 mg/dL — ABNORMAL HIGH (ref 65–99)
POTASSIUM: 3.9 mmol/L (ref 3.5–5.1)
SODIUM: 136 mmol/L (ref 135–145)
Total Bilirubin: 0.8 mg/dL (ref 0.3–1.2)
Total Protein: 7.6 g/dL (ref 6.5–8.1)

## 2017-03-20 LAB — CBC
HCT: 43.1 % (ref 36.0–46.0)
Hemoglobin: 14.2 g/dL (ref 12.0–15.0)
MCH: 30.9 pg (ref 26.0–34.0)
MCHC: 32.9 g/dL (ref 30.0–36.0)
MCV: 93.9 fL (ref 78.0–100.0)
Platelets: 339 10*3/uL (ref 150–400)
RBC: 4.59 MIL/uL (ref 3.87–5.11)
RDW: 13.6 % (ref 11.5–15.5)
WBC: 6.3 10*3/uL (ref 4.0–10.5)

## 2017-03-20 LAB — LIPASE, BLOOD: LIPASE: 38 U/L (ref 11–51)

## 2017-03-20 NOTE — ED Notes (Addendum)
Called for this patient in entire lobby x 3. Pt not found. Nurse first made aware

## 2017-03-20 NOTE — ED Triage Notes (Signed)
Pt reports abd pain since last night with burning upon urination. Reports abd pain is a recurrent problem.

## 2017-03-21 ENCOUNTER — Emergency Department (HOSPITAL_COMMUNITY)
Admission: EM | Admit: 2017-03-21 | Discharge: 2017-03-21 | Disposition: A | Discharge status: Home / Self Care | Payer: Medicare HMO | Attending: Emergency Medicine | Admitting: Emergency Medicine

## 2017-03-21 ENCOUNTER — Encounter (HOSPITAL_COMMUNITY): Payer: Self-pay | Admitting: Nurse Practitioner

## 2017-03-21 ENCOUNTER — Emergency Department (HOSPITAL_COMMUNITY): Payer: Medicare HMO

## 2017-03-21 ENCOUNTER — Ambulatory Visit
Admission: RE | Admit: 2017-03-21 | Discharge: 2017-03-21 | Disposition: A | Discharge status: Home / Self Care | Payer: Medicare HMO | Source: Ambulatory Visit | Attending: Obstetrics & Gynecology | Admitting: Obstetrics & Gynecology

## 2017-03-21 DIAGNOSIS — N6489 Other specified disorders of breast: Secondary | ICD-10-CM

## 2017-03-21 DIAGNOSIS — I2510 Atherosclerotic heart disease of native coronary artery without angina pectoris: Secondary | ICD-10-CM | POA: Insufficient documentation

## 2017-03-21 DIAGNOSIS — N183 Chronic kidney disease, stage 3 (moderate): Secondary | ICD-10-CM | POA: Diagnosis not present

## 2017-03-21 DIAGNOSIS — I129 Hypertensive chronic kidney disease with stage 1 through stage 4 chronic kidney disease, or unspecified chronic kidney disease: Secondary | ICD-10-CM | POA: Insufficient documentation

## 2017-03-21 DIAGNOSIS — K5792 Diverticulitis of intestine, part unspecified, without perforation or abscess without bleeding: Secondary | ICD-10-CM | POA: Diagnosis not present

## 2017-03-21 DIAGNOSIS — R1030 Lower abdominal pain, unspecified: Secondary | ICD-10-CM

## 2017-03-21 DIAGNOSIS — N898 Other specified noninflammatory disorders of vagina: Secondary | ICD-10-CM | POA: Insufficient documentation

## 2017-03-21 DIAGNOSIS — F17210 Nicotine dependence, cigarettes, uncomplicated: Secondary | ICD-10-CM | POA: Insufficient documentation

## 2017-03-21 LAB — WET PREP, GENITAL
Sperm: NONE SEEN
Trich, Wet Prep: NONE SEEN
YEAST WET PREP: NONE SEEN

## 2017-03-21 LAB — COMPREHENSIVE METABOLIC PANEL
ALBUMIN: 3.7 g/dL (ref 3.5–5.0)
ALK PHOS: 83 U/L (ref 38–126)
ALT: 17 U/L (ref 14–54)
ANION GAP: 9 (ref 5–15)
AST: 20 U/L (ref 15–41)
BILIRUBIN TOTAL: 1.1 mg/dL (ref 0.3–1.2)
BUN: 20 mg/dL (ref 6–20)
CALCIUM: 9.5 mg/dL (ref 8.9–10.3)
CO2: 25 mmol/L (ref 22–32)
Chloride: 104 mmol/L (ref 101–111)
Creatinine, Ser: 1.31 mg/dL — ABNORMAL HIGH (ref 0.44–1.00)
GFR, EST AFRICAN AMERICAN: 48 mL/min — AB (ref >60)
GFR, EST NON AFRICAN AMERICAN: 42 mL/min — AB (ref >60)
GLUCOSE: 108 mg/dL — AB (ref 65–99)
POTASSIUM: 3.7 mmol/L (ref 3.5–5.1)
Sodium: 138 mmol/L (ref 135–145)
TOTAL PROTEIN: 8.1 g/dL (ref 6.5–8.1)

## 2017-03-21 LAB — CBC WITH DIFFERENTIAL/PLATELET
BASOS PCT: 0 %
Basophils Absolute: 0 10*3/uL (ref 0.0–0.1)
Eosinophils Absolute: 0.2 10*3/uL (ref 0.0–0.7)
Eosinophils Relative: 2 %
HEMATOCRIT: 42.5 % (ref 36.0–46.0)
HEMOGLOBIN: 14.3 g/dL (ref 12.0–15.0)
LYMPHS ABS: 2.8 10*3/uL (ref 0.7–4.0)
Lymphocytes Relative: 42 %
MCH: 31.4 pg (ref 26.0–34.0)
MCHC: 33.6 g/dL (ref 30.0–36.0)
MCV: 93.4 fL (ref 78.0–100.0)
MONO ABS: 0.3 10*3/uL (ref 0.1–1.0)
MONOS PCT: 5 %
NEUTROS ABS: 3.3 10*3/uL (ref 1.7–7.7)
NEUTROS PCT: 51 %
Platelets: 319 10*3/uL (ref 150–400)
RBC: 4.55 MIL/uL (ref 3.87–5.11)
RDW: 13.3 % (ref 11.5–15.5)
WBC: 6.6 10*3/uL (ref 4.0–10.5)

## 2017-03-21 LAB — LIPASE, BLOOD: LIPASE: 37 U/L (ref 11–51)

## 2017-03-21 MED ORDER — TRAMADOL HCL 50 MG PO TABS: 50.0000 mg | ORAL_TABLET | Freq: Four times a day (QID) | ORAL | 0 refills | Status: DC | PRN

## 2017-03-21 MED ORDER — ONDANSETRON 4 MG PO TBDP: 4.0000 mg | ORAL_TABLET | Freq: Three times a day (TID) | ORAL | 0 refills | Status: DC | PRN

## 2017-03-21 MED ORDER — ONDANSETRON HCL 4 MG PO TABS
4.0000 mg | ORAL_TABLET | Freq: Once | ORAL | Status: AC
  Administered 2017-03-21: 4 mg via ORAL
  Filled 2017-03-21: qty 1

## 2017-03-21 MED ORDER — CIPROFLOXACIN HCL 500 MG PO TABS: 500.0000 mg | ORAL_TABLET | Freq: Two times a day (BID) | ORAL | 0 refills | Status: DC

## 2017-03-21 MED ORDER — METRONIDAZOLE 500 MG PO TABS
500.0000 mg | ORAL_TABLET | Freq: Once | ORAL | Status: AC
  Administered 2017-03-21: 500 mg via ORAL
  Filled 2017-03-21: qty 1

## 2017-03-21 MED ORDER — IOPAMIDOL (ISOVUE-300) INJECTION 61%
100.0000 mL | Freq: Once | INTRAVENOUS | Status: AC | PRN
  Administered 2017-03-21: 100 mL via INTRAVENOUS

## 2017-03-21 MED ORDER — CIPROFLOXACIN HCL 500 MG PO TABS
500.0000 mg | ORAL_TABLET | Freq: Once | ORAL | Status: AC
  Administered 2017-03-21: 500 mg via ORAL
  Filled 2017-03-21: qty 1

## 2017-03-21 MED ORDER — METRONIDAZOLE 500 MG PO TABS: 500.0000 mg | ORAL_TABLET | Freq: Two times a day (BID) | ORAL | 0 refills | Status: DC

## 2017-03-21 NOTE — ED Triage Notes (Addendum)
Pt endorses lower abdominal pain started yesterday with associated nausea diarrhea. Pt endorses dysuria and yellow vaginal discharge ongoing for 9 months. Pt comfortably eating cheetos during exam.

## 2017-03-21 NOTE — ED Provider Notes (Signed)
Patient placed in Quick Look pathway, seen and evaluated   Chief Complaint: lower abd pain  HPI:   Constant lower abd pain x 2 days with nausea, vomiting, diarrhea, (report urinary frequency and urgency with vaginal discharge x 9 months, no new sexual partner and haven't been evaluated for it)   ROS: no fever, chills, back pain, hematuria or rash.  Physical Exam:   Gen: No distress  Neuro: Awake and Alert  Skin: Warm    Focused Exam: heart RRR, no M/R/G, Lungs CTAB, abd soft, diffusely tender without guarding, no CVA tenderness.   Initiation of care has begun. The patient has been counseled on the process, plan, and necessity for staying for the completion/evaluation, and the remainder of the medical screening examination        Fayrene Helperran, Shaquel Josephson, Cordelia Poche-C 03/21/17 1521    Margarita Grizzleay, Danielle, MD 03/23/17 1114

## 2017-03-21 NOTE — ED Notes (Signed)
Patient transported to CT 

## 2017-03-21 NOTE — Discharge Instructions (Addendum)
We believe your symptoms are caused by diverticulitis.  Most of the time this condition (please read through the included information) can be cured with outpatient antibiotics.  Please take the full course of prescribed medication(s) and follow up with the doctors recommended above. ° °Return to the ED if your abdominal pain worsens or fails to improve, you develop bloody vomiting, bloody diarrhea, you are unable to tolerate fluids due to vomiting, fever greater than 101, or other symptoms that concern you. ° °Take Tramadol as prescribed for severe pain. Do not drink alcohol, drive or participate in any other potentially dangerous activities while taking this medication as it may make you sleepy. Do not take this medication with any other sedating medications, either prescription or over-the-counter.  °  °This medication is an opiate (or narcotic) pain medication and can be habit forming.  Use it as little as possible to achieve adequate pain control.  Do not use or use it with extreme caution if you have a history of opiate abuse or dependence. This medication is intended for your use only - do not give any to anyone else and keep it in a secure place where nobody else, especially children, have access to it.  It will also cause or worsen constipation, so you may want to consider taking an over-the-counter stool softener while you are taking this medication. ° ° °Diverticulitis °Diverticulitis is inflammation or infection of small pouches in your colon that form when you have a condition called diverticulosis. The pouches in your colon are called diverticula. Your colon, or large intestine, is where water is absorbed and stool is formed. °Complications of diverticulitis can include: °Bleeding. °Severe infection. °Severe pain. °Perforation of your colon. °Obstruction of your colon. °CAUSES  °Diverticulitis is caused by bacteria. °Diverticulitis happens when stool becomes trapped in diverticula. This allows bacteria  to grow in the diverticula, which can lead to inflammation and infection. °RISK FACTORS °People with diverticulosis are at risk for diverticulitis. Eating a diet that does not include enough fiber from fruits and vegetables may make diverticulitis more likely to develop. °SYMPTOMS  °Symptoms of diverticulitis may include: °Abdominal pain and tenderness. The pain is normally located on the left side of the abdomen, but may occur in other areas. °Fever and chills. °Bloating. °Cramping. °Nausea. °Vomiting. °Constipation. °Diarrhea. °Blood in your stool. °DIAGNOSIS  °Your health care provider will ask you about your medical history and do a physical exam. You may need to have tests done because many medical conditions can cause the same symptoms as diverticulitis. Tests may include: °Blood tests. °Urine tests. °Imaging tests of the abdomen, including X-rays and CT scans. °When your condition is under control, your health care provider may recommend that you have a colonoscopy. A colonoscopy can show how severe your diverticula are and whether something else is causing your symptoms. °TREATMENT  °Most cases of diverticulitis are mild and can be treated at home. Treatment may include: °Taking over-the-counter pain medicines. °Following a clear liquid diet. °Taking antibiotic medicines by mouth for 7-10 days. °More severe cases may be treated at a hospital. Treatment may include: °Not eating or drinking. °Taking prescription pain medicine. °Receiving antibiotic medicines through an IV tube. °Receiving fluids and nutrition through an IV tube. °Surgery. °HOME CARE INSTRUCTIONS  °Follow your health care provider's instructions carefully. °Follow a full liquid diet or other diet as directed by your health care provider. After your symptoms improve, your health care provider may tell you to change your diet. He or   she may recommend you eat a high-fiber diet. Fruits and vegetables are good sources of fiber. Fiber makes it easier  to pass stool. °Take fiber supplements or probiotics as directed by your health care provider. °Only take medicines as directed by your health care provider. °Keep all your follow-up appointments. °SEEK MEDICAL CARE IF:  °Your pain does not improve. °You have a hard time eating food. °Your bowel movements do not return to normal. °SEEK IMMEDIATE MEDICAL CARE IF:  °Your pain becomes worse. °Your symptoms do not get better. °Your symptoms suddenly get worse. °You have a fever. °You have repeated vomiting. °You have bloody or black, tarry stools. °MAKE SURE YOU:  °Understand these instructions. °Will watch your condition. °Will get help right away if you are not doing well or get worse. °Document Released: 11/30/2004 Document Revised: 02/25/2013 Document Reviewed: 01/15/2013 °[employer]® Patient Information ©2015 [employer]. This information is not intended to replace advice given to you by your health care provider. Make sure you discuss any questions you have with your health care provider. ° ° °

## 2017-03-21 NOTE — ED Notes (Signed)
Lab work, radiology results and vital signs reviewed, no critical results at this time, no change in acuity indicated. Awaiting urine result.

## 2017-03-22 LAB — GC/CHLAMYDIA PROBE AMP (~~LOC~~) NOT AT ARMC
CHLAMYDIA, DNA PROBE: NEGATIVE
Neisseria Gonorrhea: NEGATIVE
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
## 2017-03-29 ENCOUNTER — Encounter (HOSPITAL_COMMUNITY): Payer: Self-pay | Admitting: *Deleted

## 2017-03-29 ENCOUNTER — Other Ambulatory Visit: Payer: Self-pay

## 2017-03-29 ENCOUNTER — Emergency Department (HOSPITAL_COMMUNITY)
Admission: EM | Admit: 2017-03-29 | Discharge: 2017-03-31 | Disposition: A | Discharge status: Other Acute Inpatient Hospital | Payer: Medicare HMO | Attending: Emergency Medicine | Admitting: Emergency Medicine

## 2017-03-29 DIAGNOSIS — M545 Low back pain, unspecified: Secondary | ICD-10-CM

## 2017-03-29 DIAGNOSIS — Z87891 Personal history of nicotine dependence: Secondary | ICD-10-CM | POA: Insufficient documentation

## 2017-03-29 DIAGNOSIS — G8929 Other chronic pain: Secondary | ICD-10-CM | POA: Insufficient documentation

## 2017-03-29 DIAGNOSIS — Z79899 Other long term (current) drug therapy: Secondary | ICD-10-CM | POA: Insufficient documentation

## 2017-03-29 DIAGNOSIS — F259 Schizoaffective disorder, unspecified: Secondary | ICD-10-CM | POA: Diagnosis not present

## 2017-03-29 DIAGNOSIS — N182 Chronic kidney disease, stage 2 (mild): Secondary | ICD-10-CM | POA: Diagnosis not present

## 2017-03-29 DIAGNOSIS — B379 Candidiasis, unspecified: Secondary | ICD-10-CM | POA: Insufficient documentation

## 2017-03-29 DIAGNOSIS — I129 Hypertensive chronic kidney disease with stage 1 through stage 4 chronic kidney disease, or unspecified chronic kidney disease: Secondary | ICD-10-CM | POA: Diagnosis not present

## 2017-03-29 DIAGNOSIS — F209 Schizophrenia, unspecified: Secondary | ICD-10-CM

## 2017-03-29 DIAGNOSIS — I2510 Atherosclerotic heart disease of native coronary artery without angina pectoris: Secondary | ICD-10-CM | POA: Diagnosis not present

## 2017-03-29 DIAGNOSIS — F23 Brief psychotic disorder: Secondary | ICD-10-CM | POA: Diagnosis not present

## 2017-03-29 HISTORY — DX: Urinary tract infection, site not specified: N39.0

## 2017-03-29 HISTORY — DX: Diverticulitis of intestine, part unspecified, without perforation or abscess without bleeding: K57.92

## 2017-03-29 LAB — URINALYSIS, ROUTINE W REFLEX MICROSCOPIC
BACTERIA UA: NONE SEEN
Bilirubin Urine: NEGATIVE
Glucose, UA: NEGATIVE mg/dL
Hgb urine dipstick: NEGATIVE
KETONES UR: NEGATIVE mg/dL
Leukocytes, UA: NEGATIVE
Nitrite: NEGATIVE
PH: 6 (ref 5.0–8.0)
Protein, ur: 100 mg/dL — AB
SPECIFIC GRAVITY, URINE: 1.012 (ref 1.005–1.030)

## 2017-03-29 LAB — COMPREHENSIVE METABOLIC PANEL
ALBUMIN: 3.8 g/dL (ref 3.5–5.0)
ALK PHOS: 81 U/L (ref 38–126)
ALT: 22 U/L (ref 14–54)
AST: 24 U/L (ref 15–41)
Anion gap: 12 (ref 5–15)
BILIRUBIN TOTAL: 0.5 mg/dL (ref 0.3–1.2)
BUN: 10 mg/dL (ref 6–20)
CALCIUM: 9.4 mg/dL (ref 8.9–10.3)
CO2: 22 mmol/L (ref 22–32)
CREATININE: 1.09 mg/dL — AB (ref 0.44–1.00)
Chloride: 102 mmol/L (ref 101–111)
GFR calc Af Amer: >60 mL/min (ref >60)
GFR calc non Af Amer: 52 mL/min — ABNORMAL LOW (ref >60)
GLUCOSE: 108 mg/dL — AB (ref 65–99)
POTASSIUM: 3.9 mmol/L (ref 3.5–5.1)
Sodium: 136 mmol/L (ref 135–145)
Total Protein: 7.7 g/dL (ref 6.5–8.1)

## 2017-03-29 LAB — CBC WITH DIFFERENTIAL/PLATELET
Basophils Absolute: 0 10*3/uL (ref 0.0–0.1)
Basophils Relative: 0 %
Eosinophils Absolute: 0.2 10*3/uL (ref 0.0–0.7)
Eosinophils Relative: 3 %
HEMATOCRIT: 42.7 % (ref 36.0–46.0)
HEMOGLOBIN: 14.1 g/dL (ref 12.0–15.0)
LYMPHS ABS: 2.8 10*3/uL (ref 0.7–4.0)
Lymphocytes Relative: 38 %
MCH: 31 pg (ref 26.0–34.0)
MCHC: 33 g/dL (ref 30.0–36.0)
MCV: 93.8 fL (ref 78.0–100.0)
MONO ABS: 0.4 10*3/uL (ref 0.1–1.0)
Monocytes Relative: 5 %
NEUTROS ABS: 4 10*3/uL (ref 1.7–7.7)
NEUTROS PCT: 54 %
Platelets: 320 10*3/uL (ref 150–400)
RBC: 4.55 MIL/uL (ref 3.87–5.11)
RDW: 13.2 % (ref 11.5–15.5)
WBC: 7.4 10*3/uL (ref 4.0–10.5)

## 2017-03-29 LAB — RAPID URINE DRUG SCREEN, HOSP PERFORMED
Amphetamines: NOT DETECTED
Barbiturates: NOT DETECTED
Benzodiazepines: NOT DETECTED
COCAINE: NOT DETECTED
OPIATES: NOT DETECTED
Tetrahydrocannabinol: NOT DETECTED

## 2017-03-29 LAB — ETHANOL

## 2017-03-29 MED ORDER — OLANZAPINE 10 MG IM SOLR
10.0000 mg | Freq: Once | INTRAMUSCULAR | Status: DC | PRN
  Filled 2017-03-29: qty 10

## 2017-03-29 MED ORDER — CYCLOBENZAPRINE HCL 10 MG PO TABS: 10.0000 mg | ORAL_TABLET | Freq: Two times a day (BID) | ORAL | 0 refills | Status: DC | PRN

## 2017-03-29 MED ORDER — HYDRALAZINE HCL 25 MG PO TABS
50.0000 mg | ORAL_TABLET | Freq: Three times a day (TID) | ORAL | Status: DC
  Administered 2017-03-29 – 2017-03-31 (×7): 50 mg via ORAL
  Filled 2017-03-29 (×7): qty 2

## 2017-03-29 MED ORDER — FLUCONAZOLE 150 MG PO TABS
150.0000 mg | ORAL_TABLET | Freq: Once | ORAL | Status: AC
  Administered 2017-03-29: 150 mg via ORAL
  Filled 2017-03-29 (×2): qty 1

## 2017-03-29 MED ORDER — IBUPROFEN 600 MG PO TABS: 600.0000 mg | ORAL_TABLET | Freq: Four times a day (QID) | ORAL | 1 refills | Status: DC | PRN

## 2017-03-29 MED ORDER — LORAZEPAM 1 MG PO TABS
1.0000 mg | ORAL_TABLET | Freq: Once | ORAL | Status: AC
  Administered 2017-03-29: 1 mg via ORAL
  Filled 2017-03-29: qty 1

## 2017-03-29 MED ORDER — ISOSORBIDE MONONITRATE ER 30 MG PO TB24
90.0000 mg | ORAL_TABLET | Freq: Every day | ORAL | Status: DC
  Administered 2017-03-29 – 2017-03-31 (×3): 90 mg via ORAL
  Filled 2017-03-29 (×3): qty 3

## 2017-03-29 MED ORDER — HYDRALAZINE HCL 50 MG PO TABS: ORAL_TABLET | ORAL | 0 refills | Status: DC

## 2017-03-29 MED ORDER — CYCLOBENZAPRINE HCL 10 MG PO TABS
5.0000 mg | ORAL_TABLET | Freq: Once | ORAL | Status: AC
  Administered 2017-03-29: 5 mg via ORAL
  Filled 2017-03-29: qty 1

## 2017-03-29 MED ORDER — HYDRALAZINE HCL 25 MG PO TABS
25.0000 mg | ORAL_TABLET | Freq: Once | ORAL | Status: AC
  Administered 2017-03-29: 25 mg via ORAL
  Filled 2017-03-29: qty 1

## 2017-03-29 NOTE — Discharge Instructions (Signed)
Please call or go to one of the places on list to seek help for your anger and hallucinations. Return here if worsening, suicidal, homicidal or not able to find help elsewhere.

## 2017-03-29 NOTE — ED Notes (Signed)
Spoke with lab, they will add on UDS to urine sample.

## 2017-03-29 NOTE — Care Management (Signed)
[facility] is reviewing the patient for possible placement.

## 2017-03-29 NOTE — ED Triage Notes (Signed)
C/o having back problems for a long time, states she has been here many times for same.

## 2017-03-29 NOTE — ED Triage Notes (Signed)
Faxed vitals to [facility] at [phone]

## 2017-03-29 NOTE — Care Management (Signed)
Patient is being [facility].

## 2017-03-29 NOTE — Care Management (Signed)
[facility] has requested updated vitals for the patient to be faxed to [phone].

## 2017-03-29 NOTE — ED Notes (Signed)
Vitals faxed to [facility]

## 2017-03-29 NOTE — ED Triage Notes (Signed)
Refaxed to [facility] vitals

## 2017-03-29 NOTE — Progress Notes (Signed)
Patient chart reviewed.  Pt meets criteria for inpatient treatment.  Pt has been IVC'd by EDP.    Referrals faxed and now UNDER REVIEW at the following:  [facility]     [facility]  [facility]  [facility]  [facility]  [facility]  [facility]        Disposition CSW will continue to follow for placement.  Timmothy EulerJean T. Kaylyn LimSutter, MSW, LCSWA Disposition Clinical Social Work [phone] (cell) [phone] (office)

## 2017-03-29 NOTE — ED Notes (Signed)
Dr. Clayborne DanaMesner made aware of BP. Pt is lethargic, drowsy, confused. Does report that her back hurts. Dr. Clayborne DanaMesner will come check on her. Will give hydralazine per Dr. Clayborne DanaMesner.

## 2017-03-29 NOTE — [facility] Counselor (Signed)
Spoke with representative at [facility] and she stated the pt was declined due to medical reasons.

## 2017-03-29 NOTE — ED Notes (Signed)
Belongings in locker 3, 2 bags. Valuables in security.

## 2017-03-29 NOTE — ED Notes (Signed)
ORDERED DIET FOR PT. PER RN  
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
## 2017-03-29 NOTE — ED Notes (Signed)
Pt in her room and in hallway yelling that she's having contractions and pregnant with triplets. Pt yelling she want documentation of her children's birth on the 3rd floor. States her kids are 66 [address] old[address] Dr. Yetta BarreJones did her back surgery. Pt with flight of ideas. Difficult to console. Dr. Clayborne DanaMesner made aware. Pt agreeable to wait for urine results.

## 2017-03-29 NOTE — Care Management (Signed)
Writer informed the nurse of the need for the updated vitals.  The nurse will [facility] the [facility]als to [facility].
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
## 2017-03-29 NOTE — ED Notes (Signed)
Pt ambulated to the bathroom and was cooperative getting back in the bed and asked if she could stay

## 2017-03-29 NOTE — ED Notes (Signed)
[facility]

## 2017-03-30 DIAGNOSIS — M545 Low back pain: Secondary | ICD-10-CM | POA: Diagnosis not present

## 2017-03-30 NOTE — [facility] Counselor (Signed)
TTS Assessment:   Patient became upset when TTS writer attempted to complete re-assessment. TTS writer informed patient 'inpatient treatment was being seek.' Patient became upset expressing she was not going inpatient she was going home. Patient refused to answer additional questions. Patient continued to express she wanted her cloths so she could go home. Report she has been here twice and she was ready to return home.   TTS Clinical research associatewriter called and informed patient nurse of patient disposition.   Patient refused to answer questions pertaining to SI, HI, AVH.   Patient continues to be meeting inpatient criteria per Assunta FoundShuvon Rankin, NP.

## 2017-03-30 NOTE — ED Notes (Signed)
Patient was given [employer] Crackers, Peanut Butter, and Sprite for Snack, and A Regular Diet was ordered for Lunch.

## 2017-03-30 NOTE — ED Notes (Signed)
Pt given snack. 

## 2017-03-30 NOTE — ED Notes (Signed)
Regular Diet Ordered for Dinner. 

## 2017-03-31 ENCOUNTER — Inpatient Hospital Stay (HOSPITAL_COMMUNITY)
Admission: AD | Admit: 2017-03-31 | Discharge: 2017-04-05 | DRG: 683 | Disposition: A | Discharge status: Home / Self Care | Payer: Medicare HMO | Attending: Psychiatry | Admitting: Psychiatry

## 2017-03-31 ENCOUNTER — Encounter (HOSPITAL_COMMUNITY): Payer: Self-pay | Admitting: *Deleted

## 2017-03-31 ENCOUNTER — Other Ambulatory Visit: Payer: Self-pay

## 2017-03-31 ENCOUNTER — Encounter (HOSPITAL_COMMUNITY): Payer: Self-pay

## 2017-03-31 DIAGNOSIS — F200 Paranoid schizophrenia: Secondary | ICD-10-CM | POA: Diagnosis present

## 2017-03-31 DIAGNOSIS — I739 Peripheral vascular disease, unspecified: Secondary | ICD-10-CM | POA: Diagnosis present

## 2017-03-31 DIAGNOSIS — I25118 Atherosclerotic heart disease of native coronary artery with other forms of angina pectoris: Secondary | ICD-10-CM | POA: Diagnosis present

## 2017-03-31 DIAGNOSIS — F209 Schizophrenia, unspecified: Secondary | ICD-10-CM | POA: Diagnosis not present

## 2017-03-31 DIAGNOSIS — G4700 Insomnia, unspecified: Secondary | ICD-10-CM | POA: Diagnosis present

## 2017-03-31 DIAGNOSIS — B9689 Other specified bacterial agents as the cause of diseases classified elsewhere: Secondary | ICD-10-CM | POA: Diagnosis present

## 2017-03-31 DIAGNOSIS — E785 Hyperlipidemia, unspecified: Secondary | ICD-10-CM | POA: Diagnosis present

## 2017-03-31 DIAGNOSIS — N183 Chronic kidney disease, stage 3 (moderate): Secondary | ICD-10-CM | POA: Diagnosis present

## 2017-03-31 DIAGNOSIS — G8929 Other chronic pain: Secondary | ICD-10-CM | POA: Diagnosis present

## 2017-03-31 DIAGNOSIS — Z79899 Other long term (current) drug therapy: Secondary | ICD-10-CM | POA: Diagnosis not present

## 2017-03-31 DIAGNOSIS — F17210 Nicotine dependence, cigarettes, uncomplicated: Secondary | ICD-10-CM | POA: Diagnosis not present

## 2017-03-31 DIAGNOSIS — F201 Disorganized schizophrenia: Secondary | ICD-10-CM | POA: Diagnosis not present

## 2017-03-31 DIAGNOSIS — Z87891 Personal history of nicotine dependence: Secondary | ICD-10-CM

## 2017-03-31 DIAGNOSIS — M545 Low back pain: Secondary | ICD-10-CM | POA: Diagnosis present

## 2017-03-31 DIAGNOSIS — N760 Acute vaginitis: Secondary | ICD-10-CM | POA: Diagnosis present

## 2017-03-31 DIAGNOSIS — Z8249 Family history of ischemic heart disease and other diseases of the circulatory system: Secondary | ICD-10-CM

## 2017-03-31 DIAGNOSIS — I129 Hypertensive chronic kidney disease with stage 1 through stage 4 chronic kidney disease, or unspecified chronic kidney disease: Secondary | ICD-10-CM | POA: Diagnosis present

## 2017-03-31 DIAGNOSIS — I10 Essential (primary) hypertension: Secondary | ICD-10-CM | POA: Diagnosis not present

## 2017-03-31 MED ORDER — OLANZAPINE 5 MG PO TBDP
5.0000 mg | ORAL_TABLET | Freq: Two times a day (BID) | ORAL | Status: DC
  Administered 2017-03-31 – 2017-04-05 (×10): 5 mg via ORAL
  Filled 2017-03-31 (×18): qty 1

## 2017-03-31 MED ORDER — ALUM & MAG HYDROXIDE-SIMETH 200-200-20 MG/5ML PO SUSP: 30.0000 mL | ORAL | Status: DC | PRN

## 2017-03-31 MED ORDER — MAGNESIUM HYDROXIDE 400 MG/5ML PO SUSP: 30.0000 mL | Freq: Every day | ORAL | Status: DC | PRN

## 2017-03-31 MED ORDER — BENZTROPINE MESYLATE 1 MG PO TABS
1.0000 mg | ORAL_TABLET | Freq: Four times a day (QID) | ORAL | Status: DC | PRN
  Administered 2017-04-03: 1 mg via ORAL
  Filled 2017-03-31: qty 1

## 2017-03-31 MED ORDER — HALOPERIDOL 5 MG PO TABS
5.0000 mg | ORAL_TABLET | Freq: Four times a day (QID) | ORAL | Status: DC | PRN
  Administered 2017-04-03: 5 mg via ORAL
  Filled 2017-03-31: qty 1

## 2017-03-31 MED ORDER — OLANZAPINE 5 MG PO TBDP
5.0000 mg | ORAL_TABLET | Freq: Two times a day (BID) | ORAL | Status: DC
  Administered 2017-03-31: 5 mg via ORAL
  Filled 2017-03-31: qty 1

## 2017-03-31 MED ORDER — ISOSORBIDE MONONITRATE ER 60 MG PO TB24
90.0000 mg | ORAL_TABLET | Freq: Every day | ORAL | Status: DC
  Administered 2017-04-01 – 2017-04-05 (×5): 90 mg via ORAL
  Filled 2017-03-31 (×9): qty 1

## 2017-03-31 MED ORDER — ACETAMINOPHEN 325 MG PO TABS: 650.0000 mg | ORAL_TABLET | Freq: Four times a day (QID) | ORAL | Status: DC | PRN

## 2017-03-31 MED ORDER — TRAZODONE HCL 50 MG PO TABS
50.0000 mg | ORAL_TABLET | Freq: Every evening | ORAL | Status: DC | PRN
  Administered 2017-04-01 – 2017-04-04 (×3): 50 mg via ORAL
  Filled 2017-03-31 (×4): qty 1

## 2017-03-31 MED ORDER — HYDROXYZINE HCL 25 MG PO TABS
25.0000 mg | ORAL_TABLET | Freq: Three times a day (TID) | ORAL | Status: DC | PRN
  Administered 2017-03-31: 25 mg via ORAL
  Filled 2017-03-31: qty 1

## 2017-03-31 MED ORDER — HYDRALAZINE HCL 50 MG PO TABS
50.0000 mg | ORAL_TABLET | Freq: Three times a day (TID) | ORAL | Status: DC
  Administered 2017-04-01 – 2017-04-02 (×4): 50 mg via ORAL
  Filled 2017-03-31 (×9): qty 1

## 2017-03-31 NOTE — Tx Team (Signed)
Initial Treatment Plan 03/31/2017 10:25 PM Erin Riley Dettman ZOX:[id number]    PATIENT STRESSORS: Health problems Medication change or noncompliance   PATIENT STRENGTHS: Wellsite geologistCommunication skills General fund of knowledge   PATIENT IDENTIFIED PROBLEMS: Psychosis  "Nothing, I just want to go home"                   DISCHARGE CRITERIA:  Improved stabilization in mood, thinking, and/or behavior Verbal commitment to aftercare and medication compliance  PRELIMINARY DISCHARGE PLAN: Outpatient therapy Medication management  PATIENT/FAMILY INVOLVEMENT: This treatment plan has been presented to and reviewed with the patient, Erin Riley Risko.  The patient and family have been given the opportunity to ask questions and make suggestions.  Levin BaconHeather V Aryka Coonradt, RN 03/31/2017, 10:25 PM

## 2017-03-31 NOTE — Progress Notes (Signed)
[facility], pt declined due to medical acuity. Pt may be restaffed in 24 hours with updated vitals if blood pressure comes down.  Trula SladeHeather Smart, MSW, LCSW Clinical Social Worker 03/31/2017 9:06 AM

## 2017-03-31 NOTE — ED Notes (Addendum)
Pt ambulated to nurses' desk attempting to make phone call. States she wants to leave. Pt attempted to walk out door of Pod F then returned to room as requested. Pt noted to be talking loudly continuously - repetitive, circumstantial, tangential. Advised pt she is under IVC. Pt states she is not and that she will leave d/t "came here to pay my bill then leave". Pt then talking about "I didn't have them children for y'all". States "I didn't have them kids out of wedlock".

## 2017-03-31 NOTE — BH Assessment (Addendum)
Reassessment: Pt was initially cooperative, and became frustrated as reassessment continued. She states that she is eating and sleeping well, lives with her son, states that she came to the hospital because "an RN wanted to check my urine". When asked if she still felt like she was pregnant, she stated, "Dr. Yetta BarreJones gave me back surgery and he took 3 sons off of me, and I could not take them home because they were premature and y'all were advertising them, but I am too strong for y'all". She states, "Can you please just let me go home". ? MSE: Pt is casually dressed in scrubs, disheveled, alert, oriented x4 with normal speech and restless motor behavior. Eye contact is good. Pt's mood is slightly irritable and affect is depressed and anxious. Affect is congruent with mood. Thought process is tangential, with ideas of reference. It is possible that pt is currently responding to internal stimuli and she seems to be experiencing delusional thought content. Pt was cooperative throughout assessment. Per tina, NP, pt continues to meet IP criteria, and TTS continues to seek inpatient psychiatric treatment. Erin Fermoina, NP will review medications.

## 2017-03-31 NOTE — ED Notes (Signed)
Patient given water to drink.  

## 2017-03-31 NOTE — ED Notes (Signed)
Pt woke - states "I'm not going to take anymore of that medicine. My mouth is dry". Offered pt po fluids - declined.

## 2017-03-31 NOTE — ED Notes (Signed)
Pt has returned to her room from bathroom - lying on bed - noted to be talking to herself - stating "I was asleep" repeatedly and mumbling.

## 2017-03-31 NOTE — ED Notes (Signed)
Will recheck BP. Pt noted to be talking loudly to herself while VS's were taken.

## 2017-03-31 NOTE — ED Notes (Addendum)
Re-TTS being performed. Asked for Counselor to ask [facility], for [facility].

## 2017-03-31 NOTE — ED Notes (Signed)
Pt noted to be irritable. Sitting on bed in room. Talking loudly to herself- states "These people are out here screwing. I was screwing too. I wasn't doing anything wrong. These African American women out here leaving these kids. It's the truth because I am talking to y'all and y'all are talking to me". Pt noted w/repetitive. tangential speech. Noted to be laughing inappropriately.

## 2017-03-31 NOTE — ED Notes (Signed)
Pt awake now - sitting up in bed talking to herself.

## 2017-03-31 NOTE — Progress Notes (Signed)
Harvie BridgeMae is a 67 year old female being admitted involuntarily to [address]-1 from [facility].  She came to the ED for back pain and was medically cleared.  She was noted talking to herself, confused and claiming that she is pregnant with triplets.  EDP completed the IVC.  She hasn't been taking her medications on a regular basis.  [facility], she was very uncooperative.  She denied SI/HI or A/V hallucinations.  She was noted talking to her self and laughing at in appropriate times.  She refused to answer many questions and would argue that RN was asking too many personal questions.  She was able to be redirected at times.   Oriented her to the unit.  Admission paperwork completed but she refused to sign any paperwork because "I want to go home."  Belongings searched and secured in locker # 17, no contraband found.  Skin assessment completed and no skin issues noted.  Q 15 minute checks initiated for safety.  We will continue to monitor the progress towards her goals.

## 2017-03-31 NOTE — ED Notes (Signed)
Patient ambulated to the restroom

## 2017-03-31 NOTE — ED Notes (Signed)
Pt states "What are you bringing this medicine to me for? I'm ready to go home?" Pt took meds w/o difficulty. Offered pt to shower - states she will wash off in room.

## 2017-03-31 NOTE — ED Notes (Signed)
Pt noted to be talking loudly. States she wants to leave and will follow up w/her dr tomorrow. Advised pt today is Sat and dr's office will not be open tomorrow. States, "Yes, they are". Pt ambulating in room - repeatedly stating she wants to leave and "I'm not leaving w/o my pocket book. I need you to [facility]". Advised pt it is not time for leave as of yet. Pt not in agreement. Sitter and RN talking calmly w/pt and attempting to reassure pt.

## 2017-03-31 NOTE — ED Notes (Signed)
Patient continues to state she wants to leave, RN explained she could not leave as she is IVC. Patient states well I am going to sit in the chair until I can go home. Patient continues to  Respond to verbal stimuli.  Patient remain in room at this time. Patient return to bed.

## 2017-03-31 NOTE — ED Notes (Signed)
Pt declined snack offered. States she wants "a Engineer, siteed [employer]" - repeatedly and laughing.

## 2017-03-31 NOTE — ED Notes (Signed)
Patient laying in bed.  

## 2017-03-31 NOTE — ED Notes (Signed)
Patient laughing inappropriate. Patient reports "I want to go home". RN advised patient she is unable to leave at this time. Patient countines to laugh in room while responding to external stimuli.

## 2017-03-31 NOTE — ED Notes (Signed)
[facility]

## 2017-04-01 DIAGNOSIS — F17210 Nicotine dependence, cigarettes, uncomplicated: Secondary | ICD-10-CM

## 2017-04-01 DIAGNOSIS — F209 Schizophrenia, unspecified: Secondary | ICD-10-CM

## 2017-04-01 NOTE — [facility] Group Notes (Signed)
[facility] Group Notes:  (Nursing/MHT/Case Management/Adjunct)  Date:  04/01/2017  Time:  11:44 AM Type of Therapy:  Psychoeducational Skills  Participation Level:  Did Not Attend  Participation Quality:  DID NOT ATTEND  Affect:  DID NOT ATTEND  Cognitive:  DID NOT ATTEND  Insight:  None  Engagement in Group:  DID NOT ATTEND  Modes of Intervention:  DID NOT ATTEND  Summary of Progress/Problems: Pt did not attend patient self inventory group.   Bethann PunchesJane O Ron Beske 04/01/2017, 11:44 AM

## 2017-04-01 NOTE — Progress Notes (Signed)
D: Pt denies SI/HI/VH, pt +ve AH- better. Pt is pleasant and cooperative. Pt seen in dayroom not interacting, but pt stayed in there some of the evening.   A: Pt was offered support and encouragement. Pt was given scheduled medications. Pt was encourage to attend groups. Q 15 minute checks were done for safety.   R: safety maintained on unit.

## 2017-04-01 NOTE — Plan of Care (Signed)
  Safety: Periods of time without injury will increase 04/01/2017 2315 - Progressing by Delos HaringPhillips, Tatyana Biber A, RN Note Pt safe on the unit at this time

## 2017-04-01 NOTE — [facility] Counselor (Signed)
Clinical Social Work Note  PSA will be attempted tomorrow due to patient's reportedly being a poor historian and delusions at this time.  Ambrose MantleMareida Grossman-Orr, LCSW 04/01/2017, 3:56 PM
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
## 2017-04-01 NOTE — Progress Notes (Signed)
DAR NOTE: Pt present with calm affect and pleasant  mood in the unit. Pt has been visible in the milieu but most of the time has been her room sleeping. Pt denies physical pain, took all her meds as scheduled. As per self inventory, pt had a good night sleep, good appetite, normal energy, and good concentration. Pt rate depression at 7, hopeless ness at 6, and anxiety at 7. Pt's safety ensured with 15 minute and environmental checks. Pt currently denies SI/HI and A/V hallucinations. Pt verbally agrees to seek staff if SI/HI or A/VH occurs and to consult with staff before acting on these thoughts. Will continue POC.
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
## 2017-04-01 NOTE — [facility] Group Notes (Signed)
[facility] LCSW Group Therapy Note  Date/Time:  04/01/2017  11:00AM-12:00PM  Type of Therapy and Topic:  Group Therapy:  Music and Mood  Participation Level:  Did Not Attend   Description of Group: In this process group, members listened to a variety of genres of music and identified that different types of music evoke different responses.  Patients were encouraged to identify music that was soothing for them and music that was energizing for them.  Patients discussed how this knowledge can help with wellness and recovery in various ways including managing depression and anxiety as well as encouraging healthy sleep habits.    Therapeutic Goals: 1. Patients will explore the impact of different varieties of music on mood 2. Patients will verbalize the thoughts they have when listening to different types of music 3. Patients will identify music that is soothing to them as well as music that is energizing to them 4. Patients will discuss how to use this knowledge to assist in maintaining wellness and recovery 5. Patients will explore the use of music as a coping skill  Summary of Patient Progress:  N/A  Therapeutic Modalities: Solution Focused Brief Therapy Activity   Ranada Vigorito Grossman-Orr, LCSW    

## 2017-04-02 MED ORDER — CIPROFLOXACIN HCL 500 MG PO TABS
500.0000 mg | ORAL_TABLET | Freq: Two times a day (BID) | ORAL | Status: DC
  Administered 2017-04-02 – 2017-04-05 (×7): 500 mg via ORAL
  Filled 2017-04-02 (×2): qty 1
  Filled 2017-04-02: qty 2
  Filled 2017-04-02 (×6): qty 1
  Filled 2017-04-02: qty 2
  Filled 2017-04-02: qty 1

## 2017-04-02 MED ORDER — HYDRALAZINE HCL 25 MG PO TABS
75.0000 mg | ORAL_TABLET | Freq: Three times a day (TID) | ORAL | Status: DC
  Administered 2017-04-02 – 2017-04-05 (×9): 75 mg via ORAL
  Filled 2017-04-02 (×15): qty 3

## 2017-04-02 MED ORDER — METRONIDAZOLE 500 MG PO TABS
500.0000 mg | ORAL_TABLET | Freq: Two times a day (BID) | ORAL | Status: DC
  Administered 2017-04-02 – 2017-04-05 (×7): 500 mg via ORAL
  Filled 2017-04-02: qty 1
  Filled 2017-04-02 (×2): qty 7
  Filled 2017-04-02 (×2): qty 1
  Filled 2017-04-02: qty 2
  Filled 2017-04-02 (×4): qty 1
  Filled 2017-04-02: qty 2
  Filled 2017-04-02 (×2): qty 1
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
## 2017-04-02 NOTE — Progress Notes (Signed)
Recreation Therapy Notes  INPATIENT RECREATION THERAPY ASSESSMENT  Patient Details Name: Erin Riley MRN: [id number] DOB: 05-May-1950 Today's Date: 04/02/2017  Patient Stressors: (Pt identified no stressors.)  Pt stated she was here because she had a yeast infection and came to get help.  Coping Skills:   Exercise, Music, Sports, Other (Comment)(Shopping, watch tv)  Personal Challenges: (No personal challenges identified.)  Leisure Interests (2+):  Exercise - Walking, Individual - TV, MetLifeCommunity - Edison InternationalShopping mall, Garment/textile technologistCommunity - Other (Comment)(Visiting people in the hospital)  Awareness of Community Resources:  Yes  Community Resources:  Park, Public affairs consultantestaurants, Research scientist (physical sciences)Movie Theaters, Engineering geologistLibrary  Current Use: Yes  Patient Strengths:  Skills; Ambitious  Patient Identified Areas of Improvement:  None identified  Current Recreation Participation:  Every other day  Patient Goal for Hospitalization:  "Building up self esteem"  [address] [place]  [place]  [place] (including self-harm):  No  Current HI:  No  Consent to Intern Participation: N/A    Erin Riley, Erin Riley  Erin RancherLindsay, Ehtan Delfavero A 04/02/2017, 1:56 PM

## 2017-04-02 NOTE — [facility] Suicide Risk Assessment (Addendum)
[facility] INPATIENT:  Family/Significant Other Suicide Prevention Education  Suicide Prevention Education:  Contact Attempts: Gordan PaymentDurwin Hill, son, [phone], has been identified by the patient as the family member/significant other with whom the patient will be residing, and identified as the person(s) who will aid the patient in the event of a mental health crisis.  With written consent from the patient, two attempts were made to provide suicide prevention education, prior to and/or following the patient's discharge.  We were unsuccessful in providing suicide prevention education.  A suicide education pamphlet was given to the patient to share with family/significant other.  Date and time of first attempt:04/02/17, 1238 Date and time of second attempt:04/03/17 1105  Lorri FrederickWierda, Judyann Casasola Jon, Alexander MtLCSW 04/02/2017, 12:38 PM

## 2017-04-02 NOTE — Progress Notes (Signed)
Pt did not attend goals/orientation group this morning.  

## 2017-04-02 NOTE — Progress Notes (Signed)
[facility] Group Notes:  (Nursing/MHT/Case Management/Adjunct)  Date:  04/02/2017  Time:  2:51 PM  Type of Therapy:  Nurse Education  Participation Level:  Active  Participation Quality:  Appropriate  Affect:  Flat  Cognitive:  Alert  Insight: minimal  Engagement in Group:  Engaged  Modes of Intervention:  Activity, Discussion, Education, Socialization and Support  Summary of Progress/Problems:The purpose of this group is to educate and introduce patients to the benefits of aromatherapy. Pt participated and was engaged in the group. Beatrix ShipperWright, Jasten Guyette Martin 04/02/2017, 2:51 PM

## 2017-04-02 NOTE — Progress Notes (Signed)
Recreation Therapy Notes  Date: 04/02/17 Time: 1000 Location: 500 Hall Dayroom  Group Topic: Anger Management  Goal Area(s) Addresses:  Patient will identify triggers for anger.  Patient will identify physical reaction to anger.   Patient will identify benefit of using coping skills when angry.  Intervention: Worksheet, pencils  Activity: Introduction to Anger Management.  Patients were given a worksheet where they were to identify at least 3 situations/topics/people that lead to anger, how they react to anger and problems caused because of anger.  Patients were also asked to identify coping skills they use to deal with anger.  Education: Anger Management, Discharge Planning   Education Outcome: Acknowledges education/In group clarification offered/Needs additional education.   Clinical Observations/Feedback: Pt did not attend group.   Caroll RancherMarjette Bellarose Burtt, LRT/CTRS          Caroll RancherLindsay, Kamyla Olejnik A 04/02/2017 12:49 PM

## 2017-04-02 NOTE — Progress Notes (Signed)
D:Pt is irritable and paranoid on the unit. She is dischieveled wearing scrubs with a strong mal odor. Pt's blood pressure is elevated this morning and pt says that she takes a different medication for her blood pressure. A:Offered support and 15 minute checks. Reported elevated blood pressure to MD and pt's report of a different medication.  R:Pt is currently taking a shower. Pt's blood pressure medication has been adjusted. Safety maintained on the unit.

## 2017-04-02 NOTE — Progress Notes (Signed)
Pt attended afternoon psychoed group and participated [facility] the [facility]. Pt was appropriate and actively engaged.

## 2017-04-02 NOTE — Progress Notes (Signed)
Adult Psychoeducational Group Note  Date:  04/02/2017 Time:  12:01 AM  Group Topic/Focus:  Wrap-Up Group:   The focus of this group is to help patients review their daily goal of treatment and discuss progress on daily workbooks.  Participation Level:  Did Not Attend  Participation Quality:  Did Not Attend  Affect:  Did Not Attend  Cognitive:  Did Not Attend  Insight: None  Engagement in Group:  Did Not Attend  Modes of Intervention:  Did Not Attend  Additional Comments:  Pt did not attend evening wrap up group tonight.  Erin FurnaceChristopher  Bevan Riley 04/02/2017, 12:01 AM

## 2017-04-02 NOTE — Tx Team (Signed)
Interdisciplinary Treatment and Diagnostic Plan Update  04/02/2017 Time of Session: 0920 Erin Riley MRN: [id number]  Principal Diagnosis: <principal problem not specified>  Secondary Diagnoses: Active Problems:   Schizophrenia ([place])   Current Medications:  Current Facility-Administered Medications  Medication Dose Route Frequency Provider Last Rate Last Dose  . acetaminophen (TYLENOL) tablet 650 mg  650 mg Oral Q6H PRN Okonkwo, Justina A, NP      . alum & mag hydroxide-simeth (MAALOX/MYLANTA) 200-200-20 MG/5ML suspension 30 mL  30 mL Oral Q4H PRN Okonkwo, Justina A, NP      . haloperidol (HALDOL) tablet 5 mg  5 mg Oral Q6H PRN Okonkwo, Justina A, NP       And  . benztropine (COGENTIN) tablet 1 mg  1 mg Oral Q6H PRN Okonkwo, Justina A, NP      . ciprofloxacin (CIPRO) tablet 500 mg  500 mg Oral BID Maris Berger T, MD   500 mg at 04/02/17 1153  . hydrALAZINE (APRESOLINE) tablet 75 mg  75 mg Oral Q8H [place], [name] T, MD      . isosorbide mononitrate (IMDUR) 24 hr tablet 90 mg  90 mg Oral Daily Okonkwo, Justina A, NP   90 mg at 04/02/17 0828  . magnesium hydroxide (MILK OF MAGNESIA) suspension 30 mL  30 mL Oral Daily PRN Okonkwo, Justina A, NP      . metroNIDAZOLE (FLAGYL) tablet 500 mg  500 mg Oral Q12H Pennelope Bracken, MD   500 mg at 04/02/17 1153  . OLANZapine zydis (ZYPREXA) disintegrating tablet 5 mg  5 mg Oral BID Okonkwo, Justina A, NP   5 mg at 04/02/17 0829  . traZODone (DESYREL) tablet 50 mg  50 mg Oral QHS PRN,MR X 1 Okonkwo, Justina A, NP   50 mg at 04/01/17 2132   PTA Medications: Medications Prior to Admission  Medication Sig Dispense Refill Last Dose  . acetaminophen (TYLENOL) 325 MG tablet Take 650 mg by mouth every 6 (six) hours as needed for mild pain or headache.   UNK  . aspirin EC 81 MG tablet Take 81 mg by mouth daily.   UNK  . ciprofloxacin (CIPRO) 500 MG tablet Take 1 tablet (500 mg total) by mouth 2 (two) times daily. (Patient not  taking: Reported on 03/31/2017) 14 tablet 0 Not Taking at Unknown time  . cyclobenzaprine (FLEXERIL) 10 MG tablet Take 1 tablet (10 mg total) by mouth 2 (two) times daily as needed for muscle spasms. 20 tablet 0   . hydrALAZINE (APRESOLINE) 50 MG tablet take 1 and 1/2 tablets by mouth three times a day 30 tablet 0 no fills in last 6 mos  . metroNIDAZOLE (FLAGYL) 500 MG tablet Take 1 tablet (500 mg total) by mouth 2 (two) times daily. (Patient not taking: Reported on 03/31/2017) 14 tablet 0 Not Taking at Unknown time  . ondansetron (ZOFRAN ODT) 4 MG disintegrating tablet Take 1 tablet (4 mg total) by mouth every 8 (eight) hours as needed for nausea or vomiting. (Patient not taking: Reported on 03/31/2017) 20 tablet 0 Not Taking at Unknown time  . traMADol (ULTRAM) 50 MG tablet Take 1 tablet (50 mg total) by mouth every 6 (six) hours as needed. (Patient not taking: Reported on 03/31/2017) 15 tablet 0 Not Taking at Unknown time    Patient Stressors: Health problems Medication change or noncompliance  Patient Strengths: Curator fund of knowledge  Treatment Modalities: Medication Management, Group therapy, Case management,  1 to 1 session with  clinician, Psychoeducation, Recreational therapy.   Physician Treatment Plan for Primary Diagnosis: <principal problem not specified> Long Term Goal(s): Improvement in symptoms so as ready for discharge Improvement in symptoms so as ready for discharge   Short Term Goals: Ability to identify changes in lifestyle to reduce recurrence of condition will improve Ability to maintain clinical measurements within normal limits will improve Ability to identify changes in lifestyle to reduce recurrence of condition will improve Ability to maintain clinical measurements within normal limits will improve Compliance with prescribed medications will improve  Medication Management: Evaluate patient's response, side effects, and tolerance of medication  regimen.  Therapeutic Interventions: 1 to 1 sessions, Unit Group sessions and Medication administration.  Evaluation of Outcomes: Not Met  Physician Treatment Plan for Secondary Diagnosis: Active Problems:   Schizophrenia ([place])  Long Term Goal(s): Improvement in symptoms so as ready for discharge Improvement in symptoms so as ready for discharge   Short Term Goals: Ability to identify changes in lifestyle to reduce recurrence of condition will improve Ability to maintain clinical measurements within normal limits will improve Ability to identify changes in lifestyle to reduce recurrence of condition will improve Ability to maintain clinical measurements within normal limits will improve Compliance with prescribed medications will improve     Medication Management: Evaluate patient's response, side effects, and tolerance of medication regimen.  Therapeutic Interventions: 1 to 1 sessions, Unit Group sessions and Medication administration.  Evaluation of Outcomes: Not Met   RN Treatment Plan for Primary Diagnosis: <principal problem not specified> Long Term Goal(s): Knowledge of disease and therapeutic regimen to maintain health will improve  Short Term Goals: Ability to identify and develop effective coping behaviors will improve and Compliance with prescribed medications will improve  Medication Management: RN will administer medications as ordered by provider, will assess and evaluate patient's response and provide education to patient for prescribed medication. RN will report any adverse and/or side effects to prescribing provider.  Therapeutic Interventions: 1 on 1 counseling sessions, Psychoeducation, Medication administration, Evaluate responses to treatment, Monitor vital signs and CBGs as ordered, Perform/monitor CIWA, COWS, AIMS and Fall Risk screenings as ordered, Perform wound care treatments as ordered.  Evaluation of Outcomes: Not Met   LCSW Treatment Plan for Primary  Diagnosis: <principal problem not specified> Long Term Goal(s): Safe transition to appropriate next level of care at discharge, Engage patient in therapeutic group addressing interpersonal concerns.  Short Term Goals: Engage patient in aftercare planning with referrals and resources, Increase social support and Increase skills for wellness and recovery  Therapeutic Interventions: Assess for all discharge needs, 1 to 1 time with Social worker, Explore available resources and support systems, Assess for adequacy in community support network, Educate family and significant other(s) on suicide prevention, Complete Psychosocial Assessment, Interpersonal group therapy.  Evaluation of Outcomes: Not Met   Progress in Treatment: Attending groups: No. Participating in groups: No. Taking medication as prescribed: Yes. Toleration medication: Yes. Family/Significant other contact made: No, will contact:  son Patient understands diagnosis: Yes. Discussing patient identified problems/goals with staff: Yes. Medical problems stabilized or resolved: Yes. Denies suicidal/homicidal ideation: Yes. Issues/concerns per patient self-inventory: No. Other: none  New problem(s) identified: No, Describe:  none  New Short Term/Long Term Goal(s): Pt goal: discharge, "I don't need to be here."  Discharge Plan or Barriers:   Reason for Continuation of Hospitalization: Delusions  Medication stabilization  Estimated Length of Stay: 3-5 days.  Attendees: Patient: Erin Riley 04/02/2017   Physician: Dr Nancy Fetter, MD 04/02/2017  Nursing: Sena Hitch, RN 04/02/2017   RN Care Manager: 04/02/2017   Social Worker: Lurline Idol, LCSW 04/02/2017   Recreational Therapist:  04/02/2017   Other:  04/02/2017   Other:  04/02/2017   Other: 04/02/2017        Scribe for Treatment Team: Joanne Chars, [place] 04/02/2017 12:53 PM

## 2017-04-02 NOTE — [facility] Group Notes (Signed)
[facility] LCSW Group Therapy Note  Date/Time: 04/02/17, 1315  Type of Therapy and Topic:  Group Therapy:  Overcoming Obstacles  Participation Level:  minimal  Description of Group:    In this group patients will be encouraged to explore what they see as obstacles to their own wellness and recovery. They will be guided to discuss their thoughts, feelings, and behaviors related to these obstacles. The group will process together ways to cope with barriers, with attention given to specific choices patients can make. Each patient will be challenged to identify changes they are motivated to make in order to overcome their obstacles. This group will be process-oriented, with patients participating in exploration of their own experiences as well as giving and receiving support and challenge from other group members.  Therapeutic Goals: 1. Patient will identify personal and current obstacles as they relate to admission. 2. Patient will identify barriers that currently interfere with their wellness or overcoming obstacles.  3. Patient will identify feelings, thought process and behaviors related to these barriers. 4. Patient will identify two changes they are willing to make to overcome these obstacles:    Summary of Patient Progress: Pt shared with the group that she needed to get [facility]  Pt did not speak much during group, but continues to say that "there is nothing wrong with me and they need to let me go home."      Therapeutic Modalities:   Cognitive Behavioral Therapy Solution Focused Therapy Motivational Interviewing Relapse Prevention Therapy  Daleen SquibbGreg Lawrence Mitch, LCSW
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
## 2017-04-03 NOTE — [facility] Group Notes (Signed)
[facility] Group Notes:  (Nursing/MHT/Case Management/Adjunct)  Date:  04/03/2017  Time:  10:44 AM  Type of Therapy:  Orientation and Goals Group  Participation Level:  Active  Participation Quality:  Appropriate  Affect:  Appropriate  Cognitive:  Appropriate  Insight:  Appropriate and Good  Engagement in Group:  Developing/Improving  Modes of Intervention:  Discussion, Education and Orientation  Summary of Progress/Problems:  Pt attended goals/orientation group this morning and participated in group. Pt goal for today is to try to get discharged. Pt. Seems frustrated because she is not with family.  Staff was able to discuss the treatment plan, safety, and rules for the unit. Pt was in engaged and appropriate in group.   Erin Riley J Ronnell Clinger J Jahon Bart 04/03/2017, 10:44 AM

## 2017-04-03 NOTE — Progress Notes (Signed)
Patient ID: Erin Riley, female   DOB: 12-01-1950, 67 y.o.   MRN: [id number] DAR Note: Pt observed in dayroom interacting with his peers. Pt endorsed moderate depression and anxiety, "I feel very shaky." Pt observed laughing inappropriately and talking to self; Pt however, denied pain, AVH or HI. Support, encouragement, and safe environment provided. All patient's questions and concerns addressed. 15-minute safety checks continue. Safety checks continue. Pt was med compliant. Pt attended wrap-up group.

## 2017-04-03 NOTE — [facility] Group Notes (Signed)
[facility] Group Notes:  (Nursing/MHT/Case Management/Adjunct)  Date:  04/03/2017  Time:  4:40 PM  Type of Therapy:  Psychoeducational Skills  Participation Level:  Did Not Attend  Audrie Lializabeth O Uliana Brinker 04/03/2017, 4:40 PM

## 2017-04-03 NOTE — [facility] Suicide Risk Assessment (Signed)
[facility] INPATIENT:  Family/Significant Other Suicide Prevention Education  Suicide Prevention Education:  Education Completed; Gordan PaymentDurwin Riley, Erin Riley, [phone], has been identified by the patient as the family member/significant other with whom the patient will be residing, and identified as the person(s) who will aid the patient in the event of a mental health crisis (suicidal ideations/suicide attempt).  With written consent from the patient, the family member/significant other has been provided the following suicide prevention education, prior to the and/or following the discharge of the patient.  The suicide prevention education provided includes the following:  Suicide risk factors  Suicide prevention and interventions  National Suicide Hotline telephone number  [facility] assessment telephone number  [facility] Emergency Assistance 911  [facility] and/or Residential Mobile Crisis Unit telephone number  Request made of family/significant other to:  Remove weapons (e.g., guns, rifles, knives), all items previously/currently identified as safety concern.  Erin reports he has one gun, which is locked up in a safe.  Remove drugs/medications (over-the-counter, prescriptions, illicit drugs), all items previously/currently identified as a safety concern.  The family member/significant other verbalizes understanding of the suicide prevention education information provided.  The family member/significant other agrees to remove the items of safety concern listed above.  Erin provided significant information on pt.  She has had mental health problems going back to when Erin was 67 years old.  She has lived with him for the past 18 months.  [place]  She has a history of multiple hospitalization where she "gets right" and stays that way for 4-6 months and then she starts acting strangely again.  Pt has currently been acting  this way for the past 2 weeks.  She starts talking to herself, getting into arguments with the neighbors.  She disappeared last week leaving the door wide open and did not return until 10pm.  Erin said there is no local MD that is working with her that he is aware of.  No one keeps track of if she is taking her medication.  His older brother is payee for her disability check.    Erin Riley, Erin Cornette Jon, LCSW 04/03/2017, 3:15 PM
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
## 2017-04-03 NOTE — Plan of Care (Signed)
  Safety: Periods of time without injury will increase 04/03/2017 2320 - Progressing by Delos HaringPhillips, Vincenza Dail A, RN Note Safe at this time   Education: Will be free of psychotic symptoms 04/03/2017 2320 - Progressing by Delos HaringPhillips, Ashtyn Freilich A, RN Note Pt denies , but appears to be responding at times

## 2017-04-03 NOTE — [facility] Group Notes (Signed)
[facility] LCSW Group Therapy Note  Date/Time: 04/03/17, 1115  Type of Therapy/Topic:  Group Therapy:  Feelings about Diagnosis  Participation Level:  Minimal   Mood:pleasant   Description of Group:    This group will allow patients to explore their thoughts and feelings about diagnoses they have received. Patients will be guided to explore their level of understanding and acceptance of these diagnoses. Facilitator will encourage patients to process their thoughts and feelings about the reactions of others to their diagnosis, and will guide patients in identifying ways to discuss their diagnosis with significant others in their lives. This group will be process-oriented, with patients participating in exploration of their own experiences as well as giving and receiving support and challenge from other group members.   Therapeutic Goals: 1. Patient will demonstrate understanding of diagnosis as evidence by identifying two or more symptoms of the disorder:  2. Patient will be able to express two feelings regarding the diagnosis 3. Patient will demonstrate ability to communicate their needs through discussion and/or role plays  Summary of Patient Progress:Pt joined group near the end.  Her only comments were that none of the symptoms that were being discussed applied to her. "I don't have any of that"        Therapeutic Modalities:   Cognitive Behavioral Therapy Brief Therapy Feelings Identification   Daleen SquibbGreg Ellyn Rubiano, LCSW

## 2017-04-03 NOTE — [facility] Group Notes (Signed)
[facility] Group Notes:  (Nursing/MHT/Case Management/Adjunct)  Date:  04/03/2017  Time:  4:30 PM  Type of Therapy:  Psychoeducational Skills  Participation Level:  Did Not Attend  Flonnie Wierman O Deshanna Kama 04/03/2017, 4:30 PM 
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
## 2017-04-03 NOTE — Progress Notes (Signed)
Recreation Therapy Notes  Date: 04/03/17 Time: 1000 Location: 500 Hall Dayroom  Group Topic: Coping Skills  Goal Area(s) Addresses:  Patient will be able to identify positive coping skills. Patient will be able to identify benefits of coping skills. Patient will be able to identify benefits of using coping skills post d/c.  Intervention: Magazines, scissors, glue sticks, construction paper, worksheet  Activity: Patients were given a worksheet divided into 5 categories (diversions, cognitive, social, tension release and physical).  Patients were to look through the magazines to find pictures to represent coping skills they could use for each category.  Education: PharmacologistCoping Skills, Building control surveyorDischarge Planning.   Education Outcome: Acknowledges understanding/In group clarification offered/Needs additional education.   Clinical Observations/Feedback: Pt did not attend group.     Caroll RancherMarjette Rishith Siddoway,  LRT/CTRS     Caroll RancherLindsay, Luva Metzger A 04/03/2017 11:55 AM

## 2017-04-03 NOTE — Progress Notes (Signed)
D: Pt continues to be very flat and depressed on the unit today. Pt also continues to be very isolative. Pt reported this morning that she was ready for discharge, pt has been very irritable and delusional. Pt stated " you all are giving the wrong medication, I can see medication coming out of legs and my hands." Pt required to be redirected most of the time. As per self inventory, pt reported that his depression was a 0, his hopelessness was a 0, and that his anxiety was a 0. Pt reported being negative SI/HI, no AH/VH noted. A: 15 min checks continued for patient safety. R: Pts safety maintained.

## 2017-04-03 NOTE — Progress Notes (Signed)
Adult Psychoeducational Group Note  Date:  04/03/2017 Time:  2:58 AM  Group Topic/Focus:  Wrap-Up Group:   The focus of this group is to help patients review their daily goal of treatment and discuss progress on daily workbooks.  Participation Level:  Active  Participation Quality:  Appropriate  Affect:  Appropriate  Cognitive:  Appropriate  Insight: Appropriate  Engagement in Group:  Engaged  Modes of Intervention:  Discussion  Additional Comments:  Pt stated her goal for today was to keep a positive outlook on her treatment plan. Pt stated she felt like she accomplished her goal today. Pt rated her over all day a 10. Pt stated something positive that happen today was she enjoyed going down to the gym and exercising with her peers. Pt stated she attend all groups held today.  Erin FurnaceChristopher  [name] 04/03/2017, 2:58 AM

## 2017-04-03 NOTE — Progress Notes (Signed)
D: Pt denies SI/HI/AVH, pt appears to be responding at times. Pt is pleasant and cooperative. Pt spent some time by herself in the room, but sat out for awhile in the dayroom watching TV.   A: Pt was offered support and encouragement. Pt was given scheduled medications. Pt was encourage to attend groups. Q 15 minute checks were done for safety.   R:Pt attends groups and interacts well with peers and staff. Pt is taking medication. Pt has no complaints.Pt receptive to treatment and safety maintained on unit.

## 2017-04-04 DIAGNOSIS — N760 Acute vaginitis: Secondary | ICD-10-CM

## 2017-04-04 DIAGNOSIS — Z87891 Personal history of nicotine dependence: Secondary | ICD-10-CM

## 2017-04-04 DIAGNOSIS — G4700 Insomnia, unspecified: Secondary | ICD-10-CM

## 2017-04-04 DIAGNOSIS — I10 Essential (primary) hypertension: Secondary | ICD-10-CM

## 2017-04-04 DIAGNOSIS — F201 Disorganized schizophrenia: Secondary | ICD-10-CM

## 2017-04-04 NOTE — Progress Notes (Signed)
D:Pt is paranoid on the unit. Pt's mood is labile at times and she is suspicious when taking her medications. She refused to take her medications earlier today and later with much encouragement she took her scheduled medications. Later in the afternoon pt refused her 1400 dose of blood pressure medication and later took the medication.  A:Offered support and 15 minute checks. Medication given and MD made aware of pt's reluctance to take her medications.  R:Pt verbally denies si and hi. Safety maintained on the unit.
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
## 2017-04-04 NOTE — [facility] Group Notes (Signed)
LCSW Group Therapy Note   04/04/2017 1:15pm   Type of Therapy and Topic:  Group Therapy:  Trust and Honesty  Participation Level:  Did Not Attend  Description of Group:    In this group patients will be asked to explore the value of being honest.  Patients will be guided to discuss their thoughts, feelings, and behaviors related to honesty and trusting in others. Patients will process together how trust and honesty relate to forming relationships with peers, family members, and self. Each patient will be challenged to identify and express feelings of being vulnerable. Patients will discuss reasons why people are dishonest and identify alternative outcomes if one was truthful (to self or others). This group will be process-oriented, with patients participating in exploration of their own experiences, giving and receiving support, and processing challenge from other group members.   Therapeutic Goals: 1. Patient will identify why honesty is important to relationships and how honesty overall affects relationships.  2. Patient will identify a situation where they lied or were lied too and the  feelings, thought process, and behaviors surrounding the situation 3. Patient will identify the meaning of being vulnerable, how that feels, and how that correlates to being honest with self and others. 4. Patient will identify situations where they could have told the truth, but instead lied and explain reasons of dishonesty.   Summary of Patient Progress    Therapeutic Modalities:   Cognitive Behavioral Therapy Solution Focused Therapy Motivational Interviewing Brief Therapy  Ida RogueRodney B Rhianon Zabawa, LCSW 04/04/2017 1:27 PM

## 2017-04-04 NOTE — Plan of Care (Signed)
No self injurious behavior observed or expressed. 

## 2017-04-04 NOTE — Progress Notes (Addendum)
[facility] Group Notes:  (Nursing/MHT/Case Management/Adjunct)  Date:  04/04/2017  Time:  5:59 PM  Type of Therapy:  Nurse Education  Participation Level:  Pt did not attend  Participation Quality:    Affect:    Cognitive:    Insight:    Engagement in Group:    Modes of Intervention:    Summary of Progress/Problems:  Erin Riley, Erin Riley 04/04/2017, 5:59 PM

## 2017-04-04 NOTE — Plan of Care (Signed)
  Coping: Ability to demonstrate self-control will improve 04/04/2017 2216 - Progressing by Delos HaringPhillips, Tennyson Wacha A, RN Note Pt has been appropriate on the unit this evening , not outburst or agitation observed

## 2017-04-04 NOTE — Progress Notes (Signed)
Recreation Therapy Notes  Date: 04/04/17 Time: 1000 Location: 500 Hall Dayroom  Group Topic: Stress Management  Goal Area(s) Addresses:  Patient will verbalize importance of using healthy stress management.  Patient will identify positive emotions associated with healthy stress management.   Behavioral Response: Minimal  Intervention: Calm app, script  Activity :  LRT introduced the stress management techniques of meditation and guided imagery.  LRT played a meditation from the Calm app that focused on taking inventory of any sensation they may be feeling in the body.  LRT then read a script to take patients on a mental vacation to the beach.  Education:  Stress Management, Discharge Planning.   Education Outcome: Acknowledges edcuation/In group clarification offered/Needs additional education  Clinical Observations/Feedback: Pt was quiet but unable to sit still.  However, pt stated the techniques helped to get a lot of stress off of her.   Caroll RancherMarjette Haytham Maher, LRT/CTRS         Caroll RancherLindsay, Tjay Velazquez A 04/04/2017 12:07 PM

## 2017-04-04 NOTE — Progress Notes (Signed)
Pt upset that she was not taking her medications for her Bp . Pt stated that we needed to contact her doctor to get the medications she was on. Per pt past visits [facility] other [facility] it did seem as if pt was on different medications along with the Hydralazine , but pt could not remember which ones she was taking, "call my doctor".

## 2017-04-04 NOTE — Progress Notes (Signed)
D: Pt denies SI/HI/AVH. Pt is pleasant and cooperative. Pt appears paranoid at times, pt stated she was doing whatever she needed to do so she could get out of here. Pt visible in the dayroom at times.   A: Pt was offered support and encouragement. Pt was given scheduled medications. Pt was encourage to attend groups. Q 15 minute checks were done for safety.   R:Pt attends groups and interacts well with peers and staff. Pt is taking medication. Pt receptive to treatment and safety maintained on unit.

## 2017-04-05 MED ORDER — TRAZODONE HCL 100 MG PO TABS: 100.0000 mg | ORAL_TABLET | Freq: Every evening | ORAL | 0 refills | Status: DC | PRN

## 2017-04-05 MED ORDER — TRIAMTERENE-HCTZ 37.5-25 MG PO TABS: 1.0000 | ORAL_TABLET | Freq: Every day | ORAL | 0 refills | Status: DC

## 2017-04-05 MED ORDER — TRIAMTERENE-HCTZ 37.5-25 MG PO TABS
1.0000 | ORAL_TABLET | Freq: Every day | ORAL | Status: DC
  Administered 2017-04-05: 1 via ORAL
  Filled 2017-04-05 (×3): qty 1

## 2017-04-05 MED ORDER — ACETAMINOPHEN 325 MG PO TABS: 650.0000 mg | ORAL_TABLET | Freq: Four times a day (QID) | ORAL | Status: DC | PRN

## 2017-04-05 MED ORDER — HYDRALAZINE HCL 25 MG PO TABS: 75.0000 mg | ORAL_TABLET | Freq: Three times a day (TID) | ORAL | 0 refills | Status: DC

## 2017-04-05 MED ORDER — ISOSORBIDE MONONITRATE ER 30 MG PO TB24: 90.0000 mg | ORAL_TABLET | Freq: Every day | ORAL | 0 refills | Status: DC

## 2017-04-05 MED ORDER — METRONIDAZOLE 500 MG PO TABS: 500.0000 mg | ORAL_TABLET | Freq: Two times a day (BID) | ORAL | 0 refills | Status: DC

## 2017-04-05 MED ORDER — OLANZAPINE 5 MG PO TBDP: 5.0000 mg | ORAL_TABLET | Freq: Two times a day (BID) | ORAL | 0 refills | Status: DC

## 2017-04-05 NOTE — Progress Notes (Signed)
Recreation Therapy Notes  INPATIENT RECREATION TR PLAN  Patient Details Name: Erin Riley MRN: [id number] DOB: Aug 18, 1950 Today's Date: 04/05/2017  Rec Therapy Plan Is patient appropriate for Therapeutic Recreation?: Yes Treatment times per week: about 3 days Estimated Length of Stay: 5-7 days TR Treatment/Interventions: Group participation (Comment)  Discharge Criteria Pt will be discharged from therapy if:: Discharged Treatment plan/goals/alternatives discussed and agreed upon by:: Patient/family  Discharge Summary Short term goals set: Pt will attend and participate in recreation therapy sessions. Short term goals met: Adequate for discharge Progress toward goals comments: Groups attended Which groups?: Self-esteem, Stress management Reason goals not met: None Therapeutic equipment acquired: N/A Reason patient discharged from therapy: Discharge from hospital Pt/family agrees with progress & goals achieved: Yes Date patient discharged from therapy: 04/05/17    Victorino Sparrow, LRT/CTRS  Ria Comment, Kwasi Joung A 04/05/2017, 12:04 PM

## 2017-04-05 NOTE — Plan of Care (Signed)
Pt attended and minimally participated in stress management recreation therapy session.   Caroll RancherMarjette Stalin Gruenberg, LRT/CTRS

## 2017-04-05 NOTE — Progress Notes (Signed)
Pt stated she has to leave by the 3rd to pay her bills

## 2017-04-05 NOTE — Progress Notes (Signed)
Recreation Therapy Notes  Date: 04/05/17 Time: 1000 Location: 500 Hall Dayroom  Group Topic: Self-Esteem  Goal Area(s) Addresses:  Patient will successfully identify positive attributes about themselves.  Patient will successfully identify benefit of improved self-esteem.   Behavioral Response: None  Intervention: Construction paper, markers, colored pencils  Activity: Brochure About Me:  Patients were to create a brochure to highlight the things that make them unique and set them apart from everyone else.  Patients could highlight things such as biggest accomplishment, favorite feature, important dates, best quality, etc.  Education:  Self-Esteem, Building control surveyorDischarge Planning.   Education Outcome: Acknowledges education/In group clarification offered/Needs additional education  Clinical Observations/Feedback: Pt did not participate but observed.    Caroll RancherMarjette Timmie Dugue, LRT/CTRS     Caroll RancherLindsay, Arriah Wadle A 04/05/2017 12:35 PM

## 2017-04-05 NOTE — Progress Notes (Signed)
[facility] Group Notes:  (Nursing/MHT/Case Management/Adjunct)  Date:  [id number]  Time:  2000  Type of Therapy:  Group Therapy  Participation Level:  Did Not Attend  Participation Quality:  did not attend  Affect:  did not attend  Cognitive:  did not attend  Insight:  None  Engagement in Group:  did not attend  Modes of Intervention:  did not attend  Summary of Progress/Problems:  Doristine JohnsBrooks, Virgilio Broadhead Laverne 04/05/2017, 4:35 AM

## 2017-04-05 NOTE — Progress Notes (Signed)
Pt d/c from the hospital. [facility]. All items returned. D/C instructions given, prescriptions given and diflucan samples given. Pt denies si and hi.
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
## 2017-04-05 NOTE — Progress Notes (Signed)
  [facility] Discharge Plan :  Will you be returning to the same living situation after discharge:  Yes,  home At discharge, do you have transportation home?: Yes,  security to provide transportation to [facility] Do you have the ability to pay for your medications: Yes,  MCD  Release of information consent forms completed and in the chart;  Patient's signature needed at discharge.  Patient to Follow up at: Follow-up Information    [facility] Follow up on 04/12/2017.   Why:  Follow up is scheduled for February 7th, 2019 at 9:45am.  Please bring your ID and proof of insurance.  Contact information: [address] [place] [address] [phone]        Billee CashingMcKenzie, [place], MD Follow up on 04/17/2017.   Specialty:  Family Medicine Why:  Follow up with you primary care physicain is scheduled for February 12th, 2019 at 1:00pm.   Contact information: [address] [place] [address] [phone]           Next level of care provider has access to [facility] Link:no  Safety Planning and Suicide Prevention discussed: Yes,  yes  Have you used any form of tobacco in the last 30 days? (Cigarettes, Smokeless Tobacco, Cigars, and/or Pipes): Yes  Has patient been referred to the Quitline?: Patient refused referral  Patient has been referred for addiction treatment: N/A  Ida RogueRodney B Donnamaria Shands, LCSW 04/05/2017, 10:41 AM

## 2017-05-15 ENCOUNTER — Ambulatory Visit: Payer: Medicare HMO | Admitting: Cardiology

## 2017-05-17 ENCOUNTER — Ambulatory Visit (INDEPENDENT_AMBULATORY_CARE_PROVIDER_SITE_OTHER): Payer: Medicare HMO | Admitting: Cardiology

## 2017-05-17 ENCOUNTER — Encounter: Payer: Self-pay | Admitting: Cardiology

## 2017-05-17 ENCOUNTER — Encounter: Payer: Self-pay | Admitting: *Deleted

## 2017-05-17 VITALS — BP 136/84 | HR 75 | Ht 68.0 in | Wt 253.0 lb

## 2017-05-17 DIAGNOSIS — I422 Other hypertrophic cardiomyopathy: Secondary | ICD-10-CM | POA: Diagnosis not present

## 2017-05-17 DIAGNOSIS — I119 Hypertensive heart disease without heart failure: Secondary | ICD-10-CM | POA: Diagnosis not present

## 2017-05-17 DIAGNOSIS — I10 Essential (primary) hypertension: Secondary | ICD-10-CM | POA: Diagnosis not present

## 2017-05-17 DIAGNOSIS — E785 Hyperlipidemia, unspecified: Secondary | ICD-10-CM | POA: Diagnosis not present

## 2017-05-17 DIAGNOSIS — E669 Obesity, unspecified: Secondary | ICD-10-CM

## 2017-05-17 NOTE — Progress Notes (Signed)
PCP: Billee CashingMcKenzie, Wayland, MD  Clinic Note: Chief Complaint  Patient presents with  . Follow-up    No major complaints  . Cardiomyopathy    Hypertensive Hypertrophic     HPI: Erin Riley is a 67 y.o. 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 She is a very poor historian.  She does have schizophrenia and really has no ability to help me with knowing what medications she is on how she is taking them.  Hypertensive Heart Disease  Moderate CAD, hypertension, and PAD.   She had an abnormal Myoview in 2011 followed by cardiac catheterization revealing nonobstructive disease.   Erin Riley was last seen on May 09, 2016 (very difficult visit, but she seem to be doing fine).  Recheck 2D echocardiogram to reassess filling pressures and valve.  Recent Hospitalizations: She has had several emergency room evaluations.    Behavioral health admission January 31  Studies Personally Reviewed - (if available, images/films reviewed: From Epic Chart or Care Everywhere)  Nothing since echocardiogram April 2018: Severe LVH.  Vigorous EF of 65-70%.  No RWMA. ~Only grade 1 diastolic dysfunction.  Mild aortic stenosis (mean gradient 15 mmHg)  Interval History: Erin Riley returns today really with no complaints.  She is not very active, but states that she is has not really had any complications.  She is getting over a cold and so she is little more short of breath than usual.  She denies any significant change to any exertional dyspnea.  Minimal edema that is usually easily treated by raising her feet.  She denies any PND orthopnea.  She has not had any chest tightness or pressure with rest or exertion.  No rapid irregular heartbeats palpitations.  No syncope/near syncope or TIA/amaurosis fugax.  No melena, hematochezia, hematuria, or epstaxis. No claudication.  ROS: A comprehensive was performed. Review of Systems    Constitutional: Negative for malaise/fatigue and weight loss (She is actually gained about 40 pounds according to our scales.).  HENT: Positive for congestion and sinus pain.        Getting over a cold  Respiratory:       Getting over cold.  Some residual cough  Gastrointestinal: Positive for abdominal pain (Frequent episodes of abdominal pain), constipation and nausea. Negative for blood in stool and melena.  Musculoskeletal: Positive for joint pain.  Psychiatric/Behavioral: Negative for hallucinations and suicidal ideas. The patient is nervous/anxious.        Very poor historian.  She seems more coherent today than usual does not really tell me much about what happened over the last month.  All other systems reviewed and are negative.  I have reviewed and (if needed) personally updated the patient's problem list, medications, allergies, past medical and surgical history, social and family history.   Past Medical History:  Diagnosis Date  . Chronic back pain   . Chronic kidney disease (CKD), stage II (mild)    Class I-II  . Coronary artery disease 04/2009   50% stenosis in the perforator of LAD; catheterization was for an abnormal Myoview in January 2000 showing anterior and inferolateral ischemia.  . Diverticulitis   . 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 > 3 subsequent Echos show improved/normal EF with moderate to severe concentric LVH and diastolic dysfunction with LVOT/intracavitary gradient --> 06/2016: Severe LVH.  Vigorous EF, 65-70%.??  Gr 1 DD. Mild AS.  [name] History of schizophrenia    However I am not sure about the validity of this. She is not on any medications.  . Hyperlipidemia   . Hypertension   . Hypertensive hypertrophic cardiomyopathy: NYHA class II:  Echo: Severe concentric LVH with LV OT gradient; essentially preserved EF with diastolic dysfunction 02/15/2013   Echo  06/2016: Severe Concentric LVH. Vigorous EF 65-70%. ~ Gr I DD.   . Mild aortic stenosis by prior echocardiography    Echo 06/2016: Mild AS (Mean Gradient 15 mmHg); has had prior Mod-Severe MR (not seen on current echo)  . PAD (peripheral artery disease) [facility]) March 2013   Lower extremity Dopplers: R. SFA 50-60%, R. PTA proximally occluded with distal reconstitution;; L. common iliac ~50%, L. SFA 50-70% stenosis, L. PTA < 50%  . Schizophrenia (HCC)   . Urinary tract infection     Past Surgical History:  Procedure Laterality Date  . BUNIONECTOMY    . carotid doppler  05/29/2011   left bulb/prox ICA moderate amtfibrous plaque with no evidence significant reduction.,right bulb /proximal ICA normal patency  . DOPPLER ECHOCARDIOGRAPHY  May 2014   EF 50-55%; severe concentric LVH; only grade 1 diastolic dysfunction. Mild aortic sclerosis - with LVOT /intracavitary gradient of roughly 20 mmHg mean. Mild to moderately dilated LA;; previously reported MR not seen  . DOPPLER ECHOCARDIOGRAPHY  02/02/2009   mod to severe regurg mitral, EF 20 TO 25%, tricuspid severe regurg. ,pulmonic moderate regurg.  [name] DOPPLER ECHOCARDIOGRAPHY  06/2016   Severe LVH.  Vigorous EF of 65-70%.  No RWMA. ~Only grade 1 diastolic dysfunction.  Mild aortic stenosis (mean gradient 15 mmHg)  . lower extremity doppler  05/29/2011   right SFA 50% to 59% diameter reduction,right posterior tibal atreery occlusive disease,reconstituting distally, left common illiac<50%,left SFA 50 to70%,left post. tibial <50%  . NM MYOCAR PERF WALL MOTION  03/2009   Persantine; EF 51%-both anterior and inferolateral ischemia    Current Meds  Medication Sig  . acetaminophen (TYLENOL) 325 MG tablet Take 2 tablets (650 mg total) by mouth every 6 (six) hours as needed for mild pain or headache.  . hydrALAZINE (APRESOLINE) 25 MG tablet Take 3 tablets (75 mg total) by mouth every 8 (eight) hours. For high blood pressure  . isosorbide mononitrate (IMDUR)  30 MG 24 hr tablet Take 3 tablets (90 mg total) by mouth daily. For chest pain  . metroNIDAZOLE (FLAGYL) 500 MG tablet Take 1 tablet (500 mg total) by mouth every 12 (twelve) hours. For yeast infection  . OLANZapine zydis (ZYPREXA) 5 MG disintegrating tablet Take 1 tablet (5 mg total) by mouth 2 (two) times daily. For mood control  . traZODone (DESYREL) 100 MG tablet Take 1 tablet (100 mg total) by mouth at bedtime as needed and may repeat dose one time if needed for sleep. Take 1 tablet (100 mg) by mouth at bedtime as needed: For sleep  . triamterene-hydrochlorothiazide (MAXZIDE-25) 37.5-25 MG tablet Take 1 tablet by mouth daily. For high blood pressure    No Known Allergies  Social History   Tobacco Use  . Smoking status: Former Smoker    Types: Cigarettes    Last attempt to quit: 05/14/2002    Years since quitting: 15.0  . Smokeless tobacco: Never Used  Substance Use Topics  . Alcohol use: No  . Drug use: No   Social History   Social History Narrative   Now single mother of 2 with one grandchild.  She quit smoking roughly 5 years ago and is not so since. She has also stopped drinking alcohol. She does try get routine exercise walking at least a mile 3-4 days a week.    She lives with her 32 year old mother. 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    family history includes Hypertension in her mother.  Wt Readings from Last 3 Encounters:  05/17/17 253 lb (114.8 kg)  03/21/17 212 lb (96.2 kg)  03/20/17 212 lb (96.2 kg)    PHYSICAL EXAM BP 136/84   Pulse 75   Ht 5\' 8"  (1.727 m)   Wt 253 lb (114.8 kg)   LMP 05/10/2013   BMI 38.47 kg/m   Physical Exam  Constitutional: She appears well-developed and well-nourished.  Actually well-groomed.  Does not appear disheveled  HENT:  Head: Normocephalic and atraumatic.  Neck: No hepatojugular reflux and no JVD present. Carotid bruit is not present (Referred SEM, but no bruit).  Cardiovascular: Normal rate, regular rhythm, S1 normal, S2  normal, intact distal pulses and normal pulses.  No extrasystoles are present. Exam reveals no gallop, no friction rub and no decreased pulses.  Murmur heard. High-pitched harsh early systolic murmur is present with a grade of 2/6 at the upper right sternal border radiating to the neck.  Medium-pitched blowing holosystolic murmur of grade 1/6 is also present at the upper left sternal border and apex radiating to the axilla. Pulmonary/Chest: Effort normal and breath sounds normal. No respiratory distress. She has no wheezes. She has no rales. She exhibits no tenderness.  Abdominal: Soft. Bowel sounds are normal. She exhibits no distension. There is no tenderness. There is no rebound.  Skin: Skin is warm and dry.  Psychiatric:  More lucid than usual today.  But blunted affect.  Poor historian.  Nursing note and vitals reviewed.   Adult ECG Report Not checked  Other studies Reviewed:  Additional studies/ records that were reviewed today include:  Recent Labs:  Not checked  Lab Results  Component Value Date   CHOL 171 06/09/2016   HDL 36 (L) 06/09/2016   LDLCALC 80 06/09/2016   TRIG 274 (H) 06/09/2016   CHOLHDL 4.8 06/09/2016    ASSESSMENT / PLAN: Problem List Items Addressed This Visit    Obesity (BMI 30-39.9) (Chronic)    Again significant weight gain.  Her weight still up and down and up and down.  Makes it hard to know which direction she is going.  Likely complicated by her psych condition.      Hypertensive hypertrophic cardiomyopathy: NYHA class II:   (Chronic)    Follow-up echo confirms vigorous LVEF of 65-70%.  I am not exactly sure how he can have grade 1 diastolic dysfunction with severe LVH, but this is what was read.  Thankfully, she is not showing any signs of volume overload/CHF.  Remains on hydralazine/Imdur at stable dose along with Maxide. No longer on amlodipine. Was supposed to be on max dose carvedilol, but that is not listed.  Need to make sure that she is still  taking carvedilol, but otherwise no change.      Relevant Orders   EKG 12-Lead (Completed)   Hyperlipidemia with target LDL less than 100 (Chronic)    Had been on a statin, no longer on statin.  Not sure again what is happening.  Will defer to PCP who would be following labs.      Essential hypertension - Primary (Chronic)    Well-controlled.  Had been on Spironolactone and amlodipine.  Amlodipine was discontinued.  Now on Maxide.  Not on carvedilol.  Not exactly sure who is managing her medications, but would like her to be back on a beta-blocker.  We need to figure out what happened.  Unfortunately, she is not able to tell me when and how this happened.      Relevant Orders   EKG 12-Lead (Completed)      Current medicines are reviewed at length with the patient today. (+/- concerns) n/a The following changes have been made: n/a  Patient Instructions  NO CHANGE WITH CURRENT MEDICATIONS      Your physician wants you to follow-up in 12 MONTHS WITH DR HARDING. You will receive a reminder letter in the mail two months in advance. If you don't receive a letter, please call our office to schedule the follow-up appointment.    Studies Ordered:   Orders Placed This Encounter  Procedures  . EKG 12-Lead      Bryan Lemma, M.D., M.S. Interventional Cardiologist   Pager # [phone] Phone # [phone] [address] [place], [place] [address]   Thank you for choosing [facility] at [facility]!!

## 2017-05-17 NOTE — Patient Instructions (Signed)
NO CHANGE WITH CURRENT MEDICATIONS    Your physician wants you to follow-up in 12 MONTHS WITH DR HARDING.You will receive a reminder letter in the mail two months in advance. If you don't receive a letter, please call our office to schedule the follow-up appointment.  

## 2017-05-19 ENCOUNTER — Emergency Department (HOSPITAL_COMMUNITY)
Admission: EM | Admit: 2017-05-19 | Discharge: 2017-05-19 | Discharge status: Against Medical Advice | Payer: Medicare HMO

## 2017-05-19 ENCOUNTER — Encounter: Payer: Self-pay | Admitting: Cardiology

## 2017-05-19 NOTE — Assessment & Plan Note (Signed)
Well-controlled.  Had been on Spironolactone and amlodipine.  Amlodipine was discontinued.  Now on Maxide.  Not on carvedilol.  Not exactly sure who is managing her medications, but would like her to be back on a beta-blocker.  We need to figure out what happened.  Unfortunately, she is not able to tell me when and how this happened.

## 2017-05-19 NOTE — ED Notes (Signed)
Patient left saying she is going to another hospital

## 2017-05-19 NOTE — Assessment & Plan Note (Signed)
Had been on a statin, no longer on statin.  Not sure again what is happening.  Will defer to PCP who would be following labs.

## 2017-05-19 NOTE — Assessment & Plan Note (Signed)
Again significant weight gain.  Her weight still up and down and up and down.  Makes it hard to know which direction she is going.  Likely complicated by her psych condition.

## 2017-05-19 NOTE — Assessment & Plan Note (Addendum)
Follow-up echo confirms vigorous LVEF of 65-70%.  I am not exactly sure how he can have grade 1 diastolic dysfunction with severe LVH, but this is what was read.  Thankfully, she is not showing any signs of volume overload/CHF.  Remains on hydralazine/Imdur at stable dose along with Maxide. No longer on amlodipine. Was supposed to be on max dose carvedilol, but that is not listed.  Need to make sure that she is still taking carvedilol, but otherwise no change.

## 2017-05-25 ENCOUNTER — Ambulatory Visit: Payer: Medicare HMO

## 2017-06-10 ENCOUNTER — Other Ambulatory Visit: Payer: Self-pay | Admitting: Cardiology

## 2017-06-29 ENCOUNTER — Telehealth: Payer: Self-pay | Admitting: *Deleted

## 2017-06-29 DIAGNOSIS — N183 Chronic kidney disease, stage 3 unspecified: Secondary | ICD-10-CM

## 2017-06-29 DIAGNOSIS — E669 Obesity, unspecified: Secondary | ICD-10-CM

## 2017-06-29 DIAGNOSIS — I10 Essential (primary) hypertension: Secondary | ICD-10-CM

## 2017-06-29 NOTE — Telephone Encounter (Signed)
Patient wanted to ask  Someone about an area between her  fingers on right hand    No bleeding noted or broken skin --  White grayish (dry  Area) . Patient states it does not itch "it was not there when I saw y'all".  RN recommend  Patient to monitor area may apply hydrocortisone cream follow up with her primary.  patient states she does bo have a primary anymore he not in practice . She wanted an referral from dr harding. Informed patient to TRY [facility]. She unable to , she want a referral - RN STATES AN REFERRAL WILL BE PUT IN FOR HER

## 2017-07-09 NOTE — Telephone Encounter (Signed)
REFERRAL PLACED FOR [facility].

## 2017-08-13 ENCOUNTER — Ambulatory Visit: Payer: Self-pay | Admitting: Obstetrics & Gynecology

## 2017-08-13 ENCOUNTER — Ambulatory Visit: Payer: Self-pay

## 2017-08-20 ENCOUNTER — Telehealth: Payer: Self-pay | Admitting: *Deleted

## 2017-08-20 NOTE — Telephone Encounter (Signed)
Late entry patient came to office today @ 11:30 am- stating she having burning on urination - wants an urinalysis. Patient states she went to [facility]. She state someone there told her to come to the office.  RN informed patient that she would have to go to the [facility] for assistance. She does not have  Establish care until 08/31/17 with [facility].  Cardiology would not be the best for her symptoms at present time. Address given and phone number . Patient states she will [facility].

## 2017-08-23 ENCOUNTER — Telehealth: Payer: Self-pay | Admitting: *Deleted

## 2017-08-23 NOTE — Telephone Encounter (Signed)
Patient walked into office ,  Demanding the result of urine test. Patient is stating she had one done at another office "[facility]" she states that she did not go to the place that was suggested 3 days ago.  Patient is talking incoherently about see a Dr McKenzie seeing her , and people taking gas out of her car , and people taking clothes out oRonne Binningf house. Patient started talking- loudly and cussing- about her issues to a family member who happen to be in the office today. The family member leaves patient her at the office since they did not come together. Patient continue to state she had test done and want RN TO CALL AND FIND OUT WHAT HAPPEN AND GET RESULTS. RN inform patient that if she had test results - the other office would give her the results. rn reviewed chart and no know results or conversation with [facility] documented. Patient does not have an appointment until 08/31/17 with [facility] to establish primarycare. RN did call an [facility]. It was stated that patient had gone to office but will not be establish until 08/31/17. Per staffer - patient did bring them a sample of urine in cup - but it was discard because no order was given.  RN informed information to the patient. Patient stating she will not be going anywhere else to have anything done - "I don't have any gas or money to be trying to find a place. Patient asked "are you going to give the money?" the RN replied no. RN ASKED patient what was  going to do. Patient states I am not going to anything until 08/31/17 and then walked out of the office

## 2017-08-31 ENCOUNTER — Telehealth: Payer: Self-pay | Admitting: Cardiology

## 2017-08-31 ENCOUNTER — Ambulatory Visit (INDEPENDENT_AMBULATORY_CARE_PROVIDER_SITE_OTHER): Payer: Medicare HMO | Admitting: Physician Assistant

## 2017-08-31 ENCOUNTER — Other Ambulatory Visit: Payer: Self-pay

## 2017-08-31 ENCOUNTER — Encounter: Payer: Self-pay | Admitting: Physician Assistant

## 2017-08-31 VITALS — BP 195/123 | HR 97 | Temp 97.6°F | Resp 20 | Ht 68.9 in | Wt 238.8 lb

## 2017-08-31 DIAGNOSIS — E669 Obesity, unspecified: Secondary | ICD-10-CM

## 2017-08-31 DIAGNOSIS — F209 Schizophrenia, unspecified: Secondary | ICD-10-CM | POA: Diagnosis not present

## 2017-08-31 DIAGNOSIS — E785 Hyperlipidemia, unspecified: Secondary | ICD-10-CM | POA: Diagnosis not present

## 2017-08-31 DIAGNOSIS — N183 Chronic kidney disease, stage 3 unspecified: Secondary | ICD-10-CM

## 2017-08-31 DIAGNOSIS — R7989 Other specified abnormal findings of blood chemistry: Secondary | ICD-10-CM

## 2017-08-31 DIAGNOSIS — I160 Hypertensive urgency: Secondary | ICD-10-CM

## 2017-08-31 MED ORDER — TRIAMTERENE-HCTZ 37.5-25 MG PO TABS: 1.0000 | ORAL_TABLET | Freq: Every day | ORAL | 0 refills | Status: DC

## 2017-08-31 MED ORDER — CARVEDILOL 25 MG PO TABS: 25.0000 mg | ORAL_TABLET | Freq: Every day | ORAL | 0 refills | Status: DC

## 2017-08-31 MED ORDER — CARVEDILOL 12.5 MG PO TABS: 12.5000 mg | ORAL_TABLET | Freq: Every day | ORAL | 0 refills | Status: DC

## 2017-08-31 NOTE — Progress Notes (Signed)
MRN: [id number] DOB: [birth year]  Subjective:   Erin Riley is a 67 y.o. postmenopausal female with PMH of HTN, CKD, hypertensive hypertrophic cardiomyopathy, PAD,  HLD, obesity, schizophrenia, presenting to establish care.  Of note, patient is poor historian due to schizophrenia.  She has been not accompanied by anyone else. Lives at home with sons.   Last PCP was Dr. Alyson Ingles.  Unsure as to why she is not following them anymore.   Today, has concerns of being pregnant due to no menstrual cycle in years.  Also concerned there are drugs in her urine.  Notes " those people placed them in my urine, I know they did."   Reports taking medications Zyprexa and Desyrel.  He follows with cardiology, Dr. Ellyn Hack, last visit was 05/17/17. HTN: "all this stuff they put on me makes my blood pressure high." Not taking any medications for blood pressure at this time.  Patient is not checking blood pressure at home.  Denies lightheadedness, dizziness, chronic headache, double vision, chest pain, shortness of breath, heart racing, palpitations, nausea, vomiting, abdominal pain, hematuria, lower leg swelling. Schizophrenia: When asked about her schizophrenia and psych follow-up, patient just starts laughing saying " those people are the ones that made ne this way, no I do not see them, do I need to?"   Review of Systems  Constitutional: Negative for diaphoresis and malaise/fatigue.  Eyes: Negative for blurred vision, double vision, photophobia and redness.  Respiratory: Negative for sputum production.   Cardiovascular: Negative for orthopnea, leg swelling and PND.  Genitourinary: Negative for hematuria.  Musculoskeletal: Negative for neck pain.  Neurological: Negative for tingling, speech change and weakness.    Erin Riley has a current medication list which includes the following prescription(s): acetaminophen, hydralazine, isosorbide mononitrate, isosorbide mononitrate, olanzapine zydis, paliperidone, trazodone,  triamterene-hydrochlorothiazide, and metronidazole. Also has No Known Allergies.  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 Also  has a past surgical history that includes Bunionectomy; [place] (03/2009); lower extremity doppler (05/29/2011); doppler echocardiography (May 2014); doppler echocardiography (02/02/2009); carotid doppler (05/29/2011); and doppler echocardiography (06/2016).     Social History   Socioeconomic History  . Marital status: Single    Spouse name: Not on file  . Number of children: 7  . Years of education: Not on file  . Highest education level: Not on file  Occupational History  . Not on file  Social Needs  . Financial resource strain: Not on file  . Food insecurity:    Worry: Not on file    Inability: Not on file  . Transportation needs:    Medical: Not on file    Non-medical: Not on file  Tobacco Use  . Smoking status: Former Smoker    Types: Cigarettes    Last attempt to quit: 05/14/2002    Years since quitting: 15.3  . Smokeless tobacco: Never Used  Substance and Sexual Activity  . Alcohol use: No  . Drug use: No  . Sexual activity: Yes  Lifestyle  . Physical activity:    Days per week: Not on  file    Minutes per session: Not on file  . Stress: Not on file  Relationships  . Social connections:    Talks on phone: Not on file    Gets together: Not on file    Attends religious  service: Not on file    Active member of club or organization: Not on file    Attends meetings of clubs or organizations: Not on file    Relationship status: Not on file  . Intimate partner violence:    Fear of current or ex partner: Not on file    Emotionally abused: Not on file    Physically abused: Not on file    Forced sexual activity: Not on file  Other Topics Concern  . Not on file  Social History Narrative   Now single mother of 2 with one grandchild. She quit smoking roughly 5 years ago and is not so since. She has also stopped drinking alcohol. She does try get routine exercise walking at least a mile 3-4 days a week.    She lives with her 43 year old mother. 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    Objective:   Vitals: BP (!) 195/123 (BP Location: Right Arm, Patient Position: Sitting, Cuff Size: Large)   Pulse 97   Temp 97.6 F (36.4 C) (Oral)   Resp 20   Ht 5' 8.9" (1.75 m)   Wt 238 lb 12.8 oz (108.3 kg)   LMP 05/10/2013   SpO2 99%   BMI 35.37 kg/m    Physical Exam  Constitutional: She is oriented to person, place, and time. She appears well-developed and well-nourished. No distress.  HENT:  Head: Normocephalic and atraumatic.  Eyes: Pupils are equal, round, and reactive to light. Conjunctivae and EOM are normal.  Fundoscopic exam:      The right eye shows no AV nicking, no exudate, no hemorrhage and no papilledema. The right eye shows red reflex.       The left eye shows no AV nicking, no exudate and no papilledema. The left eye shows red reflex.  Neck: Normal range of motion.  Cardiovascular: Normal rate and regular rhythm.  Murmur heard.  Systolic murmur is present. Pulmonary/Chest: Effort normal.  Musculoskeletal:       Right lower leg: She exhibits no edema.       Left lower leg: She exhibits no edema.  Neurological: She is alert and oriented to person, place, and time.  Skin: Skin is warm and dry.  Psychiatric: Her affect is blunt. Her affect is not  angry. She is not agitated and not aggressive. Thought content is paranoid. She expresses no suicidal plans and no homicidal plans.  Inappropriate laughter Tangential speech noted   Vitals reviewed.   No results found for this or any previous visit (from the past 24 hour(s)).   BP Readings from Last 3 Encounters:  08/31/17 (!) 195/123  05/17/17 136/84  04/05/17 (!) 174/97    Wt Readings from Last 3 Encounters:  08/31/17 238 lb 12.8 oz (108.3 kg)  05/17/17 253 lb (114.8 kg)  03/31/17 250 lb (113.4 kg)    Depression screen [facility] 2/9 08/31/2017  Decreased Interest 0  Down, Depressed, Hopeless 0  PHQ - 2 Score 0   EKG shows sinus rhythm with rate of 69 bpm. PR and QRS intervals within normal limits. No acute changes noted from prior EKG of 05/2017. Findings presented and discussed with Dr. Pamella Pert.   Assessment and Plan :  1. Hypertensive urgency HTN very  uncontrolled at this time. Pt is asx. EKG with no acute changes from prior EKG on 05/2017. I personally contacted patient's cardiologist, Dr. Ellyn Hack, and spoke with him about patient's case.  He reports difficulty in the past with medication compliance.  As of now, I do not think that she is on any blood pressure medications.  Contacted her pharmacy and they say that nothing has been filled in the past 6 months.  Dr. Ellyn Hack recommended restarting on Coreg and Maxide today.  Would like close follow-up in the next 3 days with her at his office.  I scheduled her an appointment with his nurse practitioner on 09/03/2017.  Given strict ED precautions in the meantime. Will follow-up with Dr. Nolon Rod on 09/05/17.  Educated patient that it would be very beneficial if she would bring her medications from her house to her next visit and it would also help  if she has a son with her to help with obtaining a better history.  Patient agrees to do so.  Does not give me permission to contact her sons today.  Notes they are working and cannot be bothered.  Would also benefit from having home health to go over medications with her to ensure she is taking the appropriate medications. - CBC with Differential/Platelet - CMP14+EGFR - Lipid panel - TSH - Urinalysis, dipstick only - EKG 12-Lead - carvedilol (COREG) 12.5 MG tablet; Take 1 tablet (12.5 mg total) by mouth daily.  Dispense: 30 tablet; Refill: 0 - triamterene-hydrochlorothiazide (MAXZIDE-25) 37.5-25 MG tablet; Take 1 tablet by mouth daily. For high blood pressure  Dispense: 30 tablet; Refill: 0 - Ambulatory referral to [facility]  2. Schizophrenia, unspecified type [facility]) Per chart review, last saw Dr. Sindy Messing during [facility] admission in 03/2017. Was started on zyprexa zydis 59m BID at that time. Pt states she is taking medication but per chart review last given 60 tablets in 03/2017. Suspect she is not taking any schizophrenia medication. Needs to follow up with psych. Wound benefit from injections.  - Ambulatory referral to Psychiatry - Ambulatory referral to [facility] 3. Hyperlipidemia with target LDL less than 100 - Lipid panel - [facility]ulatory referral to [facility] 4. CKD (chronic kidney disease) stage 3, GFR 30-59 ml/min (HCC) - [facility] [place] [facility] 5. Obesity (BMI 30-39.9)  A total of 45 minutes was spent in the room with the patient, greater than 50% of which was in counseling/coordination of care regarding hypertensive urgency and schizophrenia.   BTenna Delaine PA-C  [facility] 08/31/2017 9:56 AM

## 2017-08-31 NOTE — Patient Instructions (Addendum)
In terms of elevated blood pressure, I would like you to start taking carvedilol 12.5 mg and maxide today.  Take daily. If you start to have chest pain, blurred vision, shortness of breath, severe headache, lower leg swelling, or nausea/vomiting please seek care immediately here or at the ED.   You have an appointment with Dr. Elissa HeftyHarding's NP on 09/03/17 at 11am NP Lyman BishopLawrence. Arrive at 10:45am.  Address is [address] [place], [place] [id number]  You need to schedule an appointment for here on 09/05/17 with Dr. Creta LevinStallings. Try to bring in your medication bottles at this appointment and if your son can come as well that will be great.  I have placed a referral to psychiatry and they should contact you within 1-2 weeks. I have also placed a referral to have someone come out and help manage your medications.    Thank you for letting me participate in your health and well being.  IF you received an x-ray today, you will receive an invoice from [facility]. Please contact [facility] at [phone] with questions or concerns regarding your invoice.   IF you received labwork today, you will receive an invoice from [facility]. Please contact [facility] at [phone] with questions or concerns regarding your invoice.   Our billing staff will not be able to assist you with questions regarding bills from these companies.  You will be contacted with the lab results as soon as they are available. The fastest way to get your results is to activate your My Chart account. Instructions are located on the last page of this paperwork. If you have not heard from us regarding the results in 2 weeks, please contact this office.    I personally contacted his scheduler and scheduled for an appointment for 09/03/2017 at 11 AM with his nurse practitioner.

## 2017-08-31 NOTE — Telephone Encounter (Signed)
New message   Primary Care office calling to get medication clarification. Patient in office now; confused.

## 2017-08-31 NOTE — Telephone Encounter (Signed)
S/w Magdalene RiverWiseman, Brittany D, PA-C  @ [facility], she states that pt is there and schizophrenic very confused. She is "all over the place" and does not know what medication that she has taken today and her BP is 189/114 so she is assuming that pt has not taken her BP medication and at this point she does not know how long and has a few questions for Dr Herbie BaltimoreHarding. She will page him and see if he will call her back to discuss this while pt is there

## 2017-09-01 LAB — URINALYSIS, DIPSTICK ONLY
Bilirubin, UA: NEGATIVE
Glucose, UA: NEGATIVE
Leukocytes, UA: NEGATIVE
Nitrite, UA: NEGATIVE
RBC, UA: NEGATIVE
Specific Gravity, UA: 1.024 (ref 1.005–1.030)
Urobilinogen, Ur: 0.2 mg/dL (ref 0.2–1.0)
pH, UA: 5.5 (ref 5.0–7.5)

## 2017-09-01 LAB — LIPID PANEL
CHOLESTEROL TOTAL: 123 mg/dL (ref 100–199)
Chol/HDL Ratio: 3.1 ratio (ref 0.0–4.4)
HDL: 40 mg/dL (ref >39)
LDL Calculated: 59 mg/dL (ref 0–99)
Triglycerides: 120 mg/dL (ref 0–149)
VLDL Cholesterol Cal: 24 mg/dL (ref 5–40)

## 2017-09-01 LAB — CMP14+EGFR
ALT: 17 IU/L (ref 0–32)
AST: 16 IU/L (ref 0–40)
Albumin/Globulin Ratio: 1.2 (ref 1.2–2.2)
Albumin: 3.8 g/dL (ref 3.6–4.8)
Alkaline Phosphatase: 92 IU/L (ref 39–117)
BUN/Creatinine Ratio: 12 (ref 12–28)
BUN: 15 mg/dL (ref 8–27)
Bilirubin Total: 0.3 mg/dL (ref 0.0–1.2)
CO2: 23 mmol/L (ref 20–29)
Calcium: 9.3 mg/dL (ref 8.7–10.3)
Chloride: 104 mmol/L (ref 96–106)
Creatinine, Ser: 1.29 mg/dL — ABNORMAL HIGH (ref 0.57–1.00)
GFR calc Af Amer: 50 mL/min/1.73 — ABNORMAL LOW
GFR calc non Af Amer: 43 mL/min/1.73 — ABNORMAL LOW
Globulin, Total: 3.1 g/dL (ref 1.5–4.5)
Glucose: 87 mg/dL (ref 65–99)
Potassium: 4.1 mmol/L (ref 3.5–5.2)
Sodium: 139 mmol/L (ref 134–144)
Total Protein: 6.9 g/dL (ref 6.0–8.5)

## 2017-09-01 LAB — CBC WITH DIFFERENTIAL/PLATELET
Basophils Absolute: 0 x10E3/uL (ref 0.0–0.2)
Basos: 0 %
EOS (ABSOLUTE): 0.2 x10E3/uL (ref 0.0–0.4)
Eos: 3 %
Hematocrit: 40.1 % (ref 34.0–46.6)
Hemoglobin: 12.9 g/dL (ref 11.1–15.9)
Immature Grans (Abs): 0 x10E3/uL (ref 0.0–0.1)
Immature Granulocytes: 0 %
Lymphocytes Absolute: 1.9 x10E3/uL (ref 0.7–3.1)
Lymphs: 32 %
MCH: 30.4 pg (ref 26.6–33.0)
MCHC: 32.2 g/dL (ref 31.5–35.7)
MCV: 95 fL (ref 79–97)
Monocytes Absolute: 0.4 x10E3/uL (ref 0.1–0.9)
Monocytes: 7 %
Neutrophils Absolute: 3.4 x10E3/uL (ref 1.4–7.0)
Neutrophils: 58 %
Platelets: 290 x10E3/uL (ref 150–450)
RBC: 4.24 x10E6/uL (ref 3.77–5.28)
RDW: 14.2 % (ref 12.3–15.4)
WBC: 5.9 x10E3/uL (ref 3.4–10.8)

## 2017-09-01 LAB — TSH: TSH: 0.389 u[IU]/mL — ABNORMAL LOW (ref 0.450–4.500)

## 2017-09-02 ENCOUNTER — Encounter: Payer: Self-pay | Admitting: Physician Assistant

## 2017-09-02 NOTE — Progress Notes (Deleted)
Cardiology Office Note   Date:  09/02/2017   ID:  Erin Riley, DOB 1950/04/30, MRN [id number]  PCP:  Billee CashingMcKenzie, [place], MD  Cardiologist: Dr. Herbie BaltimoreHarding No chief complaint on file.    History of Present Illness: Erin Riley is a 67 y.o. female who presents for ongoing assessment and management of hypertensive hypertrophic cardiomyopathy, history of mild aortic valve stenosis, moderate CAD, peripheral arterial disease, most recent Myoview in 2011 followed by catheterization revealing nonobstructive disease.  She is also followed by behavioral health in the setting of schizophrenia.  The patient has had several evaluations in the emergency room.  She is a poor historian.  She was last seen by Dr. Rennis GoldenHilty on 05/17/2017, at which time we discussed recent echocardiogram revealing EF of 65% 70%.  She was not found to be volume overloaded.  She was continued on hydralazine/Imdur, carvedilol and Maxide.  Received a phone call from social worker on 08/31/2017 describing the patient was very confused, schizophrenia was not well controlled, she was unable to discuss what medication she was taking in and found that her blood pressure was 189/114.  It was uncertain if she had been taking her medications regularly.    Past Medical History:  Diagnosis Date  . Chronic back pain   . Chronic kidney disease (CKD), stage II (mild)    Class I-II  . Coronary artery disease 04/2009   50% stenosis in the perforator of LAD; catheterization was for an abnormal Myoview in January 2000 showing anterior and inferolateral ischemia.  . Diverticulitis   . 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 > 3 subsequent Echos show improved/normal EF with moderate to severe concentric LVH and diastolic dysfunction with LVOT/intracavitary gradient --> 06/2016: Severe LVH.  Vigorous EF, 65-70%.?? Gr 1 DD. Mild AS.  [name]. History of schizophrenia    However I am not sure about the validity of this. She is not on any medications.  . Hyperlipidemia   . Hypertension   . Hypertensive hypertrophic cardiomyopathy: NYHA class II:  Echo: Severe concentric LVH with LV OT gradient; essentially preserved EF with diastolic dysfunction 02/15/2013   Echo 06/2016: Severe Concentric LVH. Vigorous EF 65-70%. ~ Gr I DD.   . Mild aortic stenosis by prior echocardiography    Echo 06/2016: Mild AS (Mean Gradient 15 mmHg); has had prior Mod-Severe MR (not seen on current echo)  . P[facility]peripheral artery disease) [facility] March [facility]   Lower extremity Dopplers: R. SFA 50-60%, R. PTA proximally occluded with distal reconstitution;; L. common iliac ~50%, L. SFA 50-70% stenosis, L. PTA < 50%  . Schizophrenia (HCC)   . Urinary tract infection     Past Surgical History:  Procedure Laterality Date  . BUNIONECTOMY    . carotid doppler  05/29/2011   left bulb/prox ICA moderate amtfibrous plaque with no evidence significant reduction.,right bulb /proximal ICA normal patency  . DOPPLER ECHOCARDIOGRAPHY  May 2014   EF 50-55%; severe concentric LVH; only grade 1 diastolic dysfunction. Mild aortic sclerosis - with LVOT /intracavitary gradient of roughly 20 mmHg mean. Mild to moderately dilated LA;; previously reported MR not seen  . DOPPLER ECHOCARDIOGRAPHY  02/02/2009   mod to severe regurg mitral, EF 20 TO 25%, tricuspid severe regurg. ,pulmonic moderate regurg.  [name]. DOPPLER ECHOCARDIOGRAPHY  06/2016   Severe LVH.  Vigorous EF of 65-70%.  No RWMA. ~Only grade 1 diastolic dysfunction.  Mild aortic stenosis (  mean gradient 15 mmHg)  . lower extremity doppler  05/29/2011   right SFA 50% to 59% diameter reduction,right posterior tibal atreery occlusive disease,reconstituting distally, left common illiac<50%,left SFA 50 to70%,left post. tibial <50%  . NM MYOCAR PERF WALL MOTION  03/2009   Persantine; EF 51%-both anterior and inferolateral ischemia     Current Outpatient  Medications  Medication Sig Dispense Refill  . acetaminophen (TYLENOL) 325 MG tablet Take 2 tablets (650 mg total) by mouth every 6 (six) hours as needed for mild pain or headache.    . carvedilol (COREG) 12.5 MG tablet Take 1 tablet (12.5 mg total) by mouth daily. 30 tablet 0  . hydrALAZINE (APRESOLINE) 25 MG tablet Take 3 tablets (75 mg total) by mouth every 8 (eight) hours. For high blood pressure 12 tablet 0  . isosorbide mononitrate (IMDUR) 30 MG 24 hr tablet Take 3 tablets (90 mg total) by mouth daily. For chest pain 12 tablet 0  . isosorbide mononitrate (IMDUR) 60 MG 24 hr tablet TAKE 1 AND 1/2 TABLETS BY MOUTH EVERY DAY 45 tablet 0  . metroNIDAZOLE (FLAGYL) 500 MG tablet Take 1 tablet (500 mg total) by mouth every 12 (twelve) hours. For yeast infection (Patient not taking: Reported on 08/31/2017) 6 tablet 0  . OLANZapine zydis (ZYPREXA) 5 MG disintegrating tablet Take 1 tablet (5 mg total) by mouth 2 (two) times daily. For mood control 60 tablet 0  . paliperidone (INVEGA SUSTENNA) 156 MG/ML SUSY injection Inject into the muscle.    . traZODone (DESYREL) 100 MG tablet Take 1 tablet (100 mg total) by mouth at bedtime as needed and may repeat dose one time if needed for sleep. Take 1 tablet (100 mg) by mouth at bedtime as needed: For sleep 30 tablet 0  . triamterene-hydrochlorothiazide (MAXZIDE-25) 37.5-25 MG tablet Take 1 tablet by mouth daily. For high blood pressure 30 tablet 0   No current facility-administered medications for this visit.     Allergies:   Patient has no known allergies.    Social History:  The patient  reports that she quit smoking about 15 years ago. Her smoking use included cigarettes. She has never used smokeless tobacco. She reports that she does not drink alcohol or use drugs.   Family History:  The patient's family history includes Hypertension in her mother.    ROS: All other systems are reviewed and negative. Unless otherwise mentioned in H&P    PHYSICAL  EXAM: VS:  LMP 05/10/2013  , BMI There is no height or weight on file to calculate BMI. GEN: Well nourished, well developed, in no acute distress  HEENT: normal  Neck: no JVD, carotid bruits, or masses Cardiac: ***RRR; no murmurs, rubs, or gallops,no edema  Respiratory:  clear to auscultation bilaterally, normal work of breathing GI: soft, nontender, nondistended, + BS MS: no deformity or atrophy  Skin: warm and dry, no rash Neuro:  Strength and sensation are intact Psych: euthymic mood, full affect   EKG:  EKG {ACTION; IS/IS [id number]} ordered today. The ekg ordered today demonstrates ***   Recent Labs: 08/31/2017: ALT 17; BUN 15; Creatinine, Ser 1.29; Hemoglobin 12.9; Platelets 290; Potassium 4.1; Sodium 139; TSH 0.389    Lipid Panel    Component Value Date/Time   CHOL 123 08/31/2017 1043   TRIG 120 08/31/2017 1043   HDL 40 08/31/2017 1043   CHOLHDL 3.1 08/31/2017 1043   CHOLHDL 4.8 06/09/2016 1056   VLDL 55 (H) 06/09/2016 1056   LDLCALC 59 08/31/2017 1043  Wt Readings from Last 3 Encounters:  08/31/17 238 lb 12.8 oz (108.3 kg)  05/17/17 253 lb (114.8 kg)  03/21/17 212 lb (96.2 kg)      Other studies Reviewed: Additional studies/ records that were reviewed today include: ***. Review of the above records demonstrates: ***   ASSESSMENT AND PLAN:  1.  ***   Current medicines are reviewed at length with the patient today.    Labs/ tests ordered today include: *** Bettey Mare. Liborio Nixon, ANP, AACC   09/02/2017 10:20 AM     [facility] [address], [place], [place] [address] Phone: [phone]; Fax: [phone]

## 2017-09-03 ENCOUNTER — Inpatient Hospital Stay (EMERGENCY_DEPARTMENT_HOSPITAL)
Admission: AD | Admit: 2017-09-03 | Discharge: 2017-09-04 | Disposition: A | Discharge status: Home / Self Care | Payer: Medicare HMO | Source: Ambulatory Visit | Attending: Obstetrics & Gynecology | Admitting: Obstetrics & Gynecology

## 2017-09-03 ENCOUNTER — Ambulatory Visit: Payer: Self-pay | Admitting: Adult Health

## 2017-09-03 ENCOUNTER — Encounter (HOSPITAL_COMMUNITY): Payer: Self-pay | Admitting: *Deleted

## 2017-09-03 DIAGNOSIS — G8929 Other chronic pain: Secondary | ICD-10-CM

## 2017-09-03 DIAGNOSIS — I10 Essential (primary) hypertension: Secondary | ICD-10-CM

## 2017-09-03 DIAGNOSIS — Z789 Other specified health status: Secondary | ICD-10-CM | POA: Diagnosis not present

## 2017-09-03 DIAGNOSIS — B373 Candidiasis of vulva and vagina: Secondary | ICD-10-CM | POA: Diagnosis not present

## 2017-09-03 DIAGNOSIS — N182 Chronic kidney disease, stage 2 (mild): Secondary | ICD-10-CM

## 2017-09-03 DIAGNOSIS — I129 Hypertensive chronic kidney disease with stage 1 through stage 4 chronic kidney disease, or unspecified chronic kidney disease: Secondary | ICD-10-CM

## 2017-09-03 DIAGNOSIS — Z792 Long term (current) use of antibiotics: Secondary | ICD-10-CM

## 2017-09-03 DIAGNOSIS — Z9889 Other specified postprocedural states: Secondary | ICD-10-CM | POA: Insufficient documentation

## 2017-09-03 DIAGNOSIS — F209 Schizophrenia, unspecified: Secondary | ICD-10-CM | POA: Diagnosis not present

## 2017-09-03 DIAGNOSIS — Z5321 Procedure and treatment not carried out due to patient leaving prior to being seen by health care provider: Secondary | ICD-10-CM | POA: Diagnosis not present

## 2017-09-03 DIAGNOSIS — M549 Dorsalgia, unspecified: Secondary | ICD-10-CM

## 2017-09-03 DIAGNOSIS — B3731 Acute candidiasis of vulva and vagina: Secondary | ICD-10-CM

## 2017-09-03 DIAGNOSIS — Z79899 Other long term (current) drug therapy: Secondary | ICD-10-CM | POA: Insufficient documentation

## 2017-09-03 DIAGNOSIS — I422 Other hypertrophic cardiomyopathy: Secondary | ICD-10-CM | POA: Insufficient documentation

## 2017-09-03 DIAGNOSIS — Z3200 Encounter for pregnancy test, result unknown: Secondary | ICD-10-CM | POA: Diagnosis present

## 2017-09-03 LAB — URINALYSIS, ROUTINE W REFLEX MICROSCOPIC
Bilirubin Urine: NEGATIVE
Glucose, UA: NEGATIVE mg/dL
Hgb urine dipstick: NEGATIVE
Ketones, ur: NEGATIVE mg/dL
Nitrite: NEGATIVE
Protein, ur: 30 mg/dL — AB
Specific Gravity, Urine: 1.015 (ref 1.005–1.030)
pH: 6 (ref 5.0–8.0)

## 2017-09-03 LAB — WET PREP, GENITAL
Clue Cells Wet Prep HPF POC: NONE SEEN
Sperm: NONE SEEN
Trich, Wet Prep: NONE SEEN
Yeast Wet Prep HPF POC: NONE SEEN

## 2017-09-03 LAB — POCT PREGNANCY, URINE: Preg Test, Ur: NEGATIVE

## 2017-09-03 MED ORDER — HYDRALAZINE HCL 50 MG PO TABS
75.0000 mg | ORAL_TABLET | Freq: Once | ORAL | Status: AC
  Administered 2017-09-03: 75 mg via ORAL
  Filled 2017-09-03: qty 1

## 2017-09-03 MED ORDER — FLUCONAZOLE 150 MG PO TABS: 150.0000 mg | ORAL_TABLET | Freq: Once | ORAL | 0 refills | Status: AC

## 2017-09-03 NOTE — Addendum Note (Signed)
Addended by: Isaac BlissGALLOWAY, Tierria Watson J on: 09/03/2017 10:02 AM   Modules accepted: Orders

## 2017-09-04 ENCOUNTER — Encounter: Payer: Self-pay | Admitting: *Deleted

## 2017-09-04 ENCOUNTER — Emergency Department (HOSPITAL_COMMUNITY)
Admission: EM | Admit: 2017-09-04 | Discharge: 2017-09-04 | Discharge status: Against Medical Advice | Payer: Medicare HMO | Attending: Emergency Medicine | Admitting: Emergency Medicine

## 2017-09-04 ENCOUNTER — Encounter (HOSPITAL_COMMUNITY): Payer: Self-pay | Admitting: Emergency Medicine

## 2017-09-04 DIAGNOSIS — Z5321 Procedure and treatment not carried out due to patient leaving prior to being seen by health care provider: Secondary | ICD-10-CM | POA: Insufficient documentation

## 2017-09-04 DIAGNOSIS — Z3200 Encounter for pregnancy test, result unknown: Secondary | ICD-10-CM | POA: Diagnosis not present

## 2017-09-04 LAB — GC/CHLAMYDIA PROBE AMP (~~LOC~~) NOT AT ARMC
Chlamydia: NEGATIVE
Neisseria Gonorrhea: NEGATIVE

## 2017-09-04 LAB — T3, FREE: T3, Free: 3.5 pg/mL (ref 2.0–4.4)

## 2017-09-04 LAB — T4, FREE: FREE T4: 1.1 ng/dL (ref 0.82–1.77)

## 2017-09-04 NOTE — ED Notes (Signed)
Pt in hallway bed, upset about being here for the past 3 hours and "no one has taken by blood to see my numbers to be pregnant." Pt initially laughing and cooperative, allowing RN to recheck her vitals. Pt then started calling another staff member names, stating "No one is helping me because they mad they can't have kids and I'm pregnant." Pt requesting to leave. Encouraged pt to stay. Denies SI/HI, reports "I need to get home to my kids." Pt got up from bed stating"this bed is wet, I can't believe you pt me in a wet damn bed. I didn't piss myself." Pt attempted to ambulate but was limping, offered wheelchair numerous times, but pt refused. Then, pt became more aggressive, yelling at this RN to grab her a wheelchair "because you know how bad my feet are." Pt wheeled to lobby. MD informed

## 2017-09-04 NOTE — MAU Provider Note (Signed)
Chief Complaint: Vaginal Itching   First Provider Initiated Contact with Patient 09/03/17 2235    SUBJECTIVE HPI: Erin Riley is a 67 y.o. G3P3003 not currently pregnant who presents to maternity admissions reporting vaginal itching. She reports vaginal itching started yesterday. She reports that she is [redacted] weeks pregnant and was at the doctors office today but did not reports vaginal itching. She reports yellow discharge is associated with the itching- has not taken any OTC medication. She denies abdominal pain, vaginal bleeding, urinary symptoms, h/a, dizziness, n/v, or fever/chills. She has a hx of Schizophrenia that she takes medication for and a hx of Hypertension that she has not been taken her medication for.   Past Medical History:  Diagnosis Date  . Chronic back pain   . Chronic kidney disease (CKD), stage II (mild)    Class I-II  . Coronary artery disease 04/2009   50% stenosis in the perforator of LAD; catheterization was for an abnormal Myoview in January 2000 showing anterior and inferolateral ischemia.  . Diverticulitis   . 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 > 3 subsequent Echos show improved/normal EF with moderate to severe concentric LVH and diastolic dysfunction with LVOT/intracavitary gradient --> 06/2016: Severe LVH.  Vigorous EF, 65-70%.?? Gr 1 DD. Mild AS.  [name] History of schizophrenia    However I am not sure about the validity of this. She is not on any medications.  . Hyperlipidemia   . Hypertension   . Hypertensive hypertrophic cardiomyopathy: NYHA class II:  Echo: Severe concentric LVH with LV OT gradient; essentially preserved EF with diastolic dysfunction 02/15/2013   Echo 06/2016: Severe Concentric LVH. Vigorous EF 65-70%. ~ Gr I DD.   . Mild aortic stenosis by prior echocardiography    Echo 06/2016: Mild AS (Mean Gradient 15 mmHg); has had prior Mod-Severe MR (not seen  on current echo)  . PAD (peripheral artery disease) [facility]) March 2013   Lower extremity Dopplers: R. SFA 50-60%, R. PTA proximally occluded with distal reconstitution;; L. common iliac ~50%, L. SFA 50-70% stenosis, L. PTA < 50%  . Schizophrenia (HCC)   . Urinary tract infection    Past Surgical History:  Procedure Laterality Date  . BUNIONECTOMY    . carotid doppler  05/29/2011   left bulb/prox ICA moderate amtfibrous plaque with no evidence significant reduction.,right bulb /proximal ICA normal patency  . DOPPLER ECHOCARDIOGRAPHY  May 2014   EF 50-55%; severe concentric LVH; only grade 1 diastolic dysfunction. Mild aortic sclerosis - with LVOT /intracavitary gradient of roughly 20 mmHg mean. Mild to moderately dilated LA;; previously reported MR not seen  . DOPPLER ECHOCARDIOGRAPHY  02/02/2009   mod to severe regurg mitral, EF 20 TO 25%, tricuspid severe regurg. ,pulmonic moderate regurg.  [name] DOPPLER ECHOCARDIOGRAPHY  06/2016   Severe LVH.  Vigorous EF of 65-70%.  No RWMA. ~Only grade 1 diastolic dysfunction.  Mild aortic stenosis (mean gradient 15 mmHg)  . lower extremity doppler  05/29/2011   right SFA 50% to 59% diameter reduction,right posterior tibal atreery occlusive disease,reconstituting distally, left common illiac<50%,left SFA 50 to70%,left post. tibial <50%  . NM MYOCAR PERF WALL MOTION  03/2009   Persantine; EF 51%-both anterior and inferolateral ischemia   Social History   Socioeconomic History  . Marital status: Single    Spouse name: Not on file  . Number of children: 7  . Years of education: Not on  file  . Highest education level: Not on file  Occupational History  . Not on file  Social Needs  . Financial resource strain: Not on file  . Food insecurity:    Worry: Not on file    Inability: Not on file  . Transportation needs:    Medical: Not on file    Non-medical: Not on file  Tobacco Use  . Smoking status: Former Smoker    Types: Cigarettes    Last attempt  to quit: 05/14/2002    Years since quitting: 15.3  . Smokeless tobacco: Never Used  Substance and Sexual Activity  . Alcohol use: No  . Drug use: No  . Sexual activity: Yes  Lifestyle  . Physical activity:    Days per week: Not on file    Minutes per session: Not on file  . Stress: Not on file  Relationships  . Social connections:    Talks on phone: Not on file    Gets together: Not on file    Attends religious service: Not on file    Active member of club or organization: Not on file    Attends meetings of clubs or organizations: Not on file    Relationship status: Not on file  . Intimate partner violence:    Fear of current or ex partner: Not on file    Emotionally abused: Not on file    Physically abused: Not on file    Forced sexual activity: Not on file  Other Topics Concern  . Not on file  Social History Narrative   Now single mother of 2 with one grandchild. She quit smoking roughly 5 years ago and is not so since. She has also stopped drinking alcohol. She does try get routine exercise walking at least a mile 3-4 days a week.    She lives with her 66 year old mother. 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   No current facility-administered medications on file prior to encounter.    Current Outpatient Medications on File Prior to Encounter  Medication Sig Dispense Refill  . acetaminophen (TYLENOL) 325 MG tablet Take 2 tablets (650 mg total) by mouth every 6 (six) hours as needed for mild pain or headache.    . carvedilol (COREG) 12.5 MG tablet Take 1 tablet (12.5 mg total) by mouth daily. 30 tablet 0  . hydrALAZINE (APRESOLINE) 25 MG tablet Take 3 tablets (75 mg total) by mouth every 8 (eight) hours. For high blood pressure 12 tablet 0  . isosorbide mononitrate (IMDUR) 30 MG 24 hr tablet Take 3 tablets (90 mg total) by mouth daily. For chest pain 12 tablet 0  . isosorbide mononitrate (IMDUR) 60 MG 24 hr tablet TAKE 1 AND 1/2 TABLETS BY MOUTH EVERY DAY 45 tablet 0  .  metroNIDAZOLE (FLAGYL) 500 MG tablet Take 1 tablet (500 mg total) by mouth every 12 (twelve) hours. For yeast infection (Patient not taking: Reported on 08/31/2017) 6 tablet 0  . OLANZapine zydis (ZYPREXA) 5 MG disintegrating tablet Take 1 tablet (5 mg total) by mouth 2 (two) times daily. For mood control 60 tablet 0  . paliperidone (INVEGA SUSTENNA) 156 MG/ML SUSY injection Inject into the muscle.    . traZODone (DESYREL) 100 MG tablet Take 1 tablet (100 mg total) by mouth at bedtime as needed and may repeat dose one time if needed for sleep. Take 1 tablet (100 mg) by mouth at bedtime as needed: For sleep 30 tablet 0  . triamterene-hydrochlorothiazide (MAXZIDE-25) 37.5-25 MG tablet Take  1 tablet by mouth daily. For high blood pressure 30 tablet 0   No Known Allergies  ROS:  Review of Systems  Respiratory: Negative.   Cardiovascular: Negative.   Gastrointestinal: Negative.   Genitourinary: Positive for vaginal discharge. Negative for difficulty urinating, dysuria, frequency, urgency and vaginal bleeding.       Vaginal irritation  Neurological: Negative.    I have reviewed patient's Past Medical Hx, Surgical Hx, Family Hx, Social Hx, medications and allergies.   Physical Exam   Patient Vitals for the past 24 hrs:  BP Temp Temp src Pulse Resp  09/04/17 0016 (!) 152/91 - - - -  09/04/17 0007 (!) 145/96 - - - -  09/03/17 2345 (!) 160/99 - - - -  09/03/17 2316 (!) 180/86 (!) 97.4 F (36.3 C) Oral 79 18  09/03/17 2315 (!) 191/105 - - - -   Constitutional: Well-developed, obese female, all over the place with her thoughts due to schizophrenia  Cardiovascular: normal rate Respiratory: normal effort GI: Abd soft, non-tender. Pos BS x 4 MS: Extremities nontender, no edema, normal ROM Neurologic: Alert and oriented x 4.   PELVIC EXAM: cervical swabs obtained, erythema and yellow vaginal discharge noted at introitus   LAB RESULTS Results for orders placed or performed during the hospital  encounter of 09/03/17 (from the past 24 hour(s))  Pregnancy, urine POC     Status: None   Collection Time: 09/03/17 10:19 PM  Result Value Ref Range   Preg Test, Ur NEGATIVE NEGATIVE  Urinalysis, Routine w reflex microscopic     Status: Abnormal   Collection Time: 09/03/17 10:23 PM  Result Value Ref Range   Color, Urine YELLOW YELLOW   APPearance HAZY (A) CLEAR   Specific Gravity, Urine 1.015 1.005 - 1.030   pH 6.0 5.0 - 8.0   Glucose, UA NEGATIVE NEGATIVE mg/dL   Hgb urine dipstick NEGATIVE NEGATIVE   Bilirubin Urine NEGATIVE NEGATIVE   Ketones, ur NEGATIVE NEGATIVE mg/dL   Protein, ur 30 (A) NEGATIVE mg/dL   Nitrite NEGATIVE NEGATIVE   Leukocytes, UA MODERATE (A) NEGATIVE   RBC / HPF 0-5 0 - 5 RBC/hpf   WBC, UA 6-10 0 - 5 WBC/hpf   Bacteria, UA RARE (A) NONE SEEN   Squamous Epithelial / LPF 11-20 0 - 5   Mucus PRESENT    Hyaline Casts, UA PRESENT   Wet prep, genital     Status: Abnormal   Collection Time: 09/03/17 10:36 PM  Result Value Ref Range   Yeast Wet Prep HPF POC NONE SEEN NONE SEEN   Trich, Wet Prep NONE SEEN NONE SEEN   Clue Cells Wet Prep HPF POC NONE SEEN NONE SEEN   WBC, Wet Prep HPF POC MANY (A) NONE SEEN   Sperm NONE SEEN     MAU Management/MDM: Orders Placed This Encounter  Procedures  . Wet prep, genital  . Urinalysis, Routine w reflex microscopic  . Pregnancy, urine POC  . Discharge patient Discharge disposition: 01-Home or Self Care; Discharge patient date: 09/03/2017   UPT- negative  Wet prep- negative, will treat for yeast based on clinical symptoms  UA- WNL, negative for nitrates   Meds ordered this encounter  Medications  . fluconazole (DIFLUCAN) 150 MG tablet    Sig: Take 1 tablet (150 mg total) by mouth once for 1 dose. Can take additional dose three days later if symptoms persist    Dispense:  1 tablet    Refill:  0  Order Specific Question:   Supervising Provider    Answer:   Adam Phenix [[id number]]  . hydrALAZINE (APRESOLINE) tablet  75 mg   Treatments in MAU included 75mg  of Hydralazine- patient is scheduled to take medication every 8 hours for elevated BP but has not taken medication is "a couple of days", reports not going to pharmacy to pick up new prescription. Educated and discussed importance of taking all prescribed medication as scheduled- Educated on BP is not normal to be this high, needs to follow up as scheduled with PCP and cardiology. Patient verbalizes understanding and reports she will pick up medication in the morning. BP dropped significantly with medication treatment . Pt discharged with strict Hypertension precautions and discussed reasons to go to [facility] ED ASAP.  ASSESSMENT 1. Chronic hypertension   2. Vaginal yeast infection   3. Schizophrenia, unspecified type (HCC)   4. Not currently pregnant     PLAN Discharge home Rx for diflucan  Follow up as scheduled with PCP and Cardio  Discussed reason to return to MAU  Educated on reason to go to [facility] ED to be evaluated immediately  Pt stable at time of discharge.   Follow-up Information    Billee Cashing, MD. Schedule an appointment as soon as possible for a visit.   Specialty:  Family Medicine Why:  Follow up with primary care doctor for worsening symptoms and control of hypertension  Contact information: [address] [place] [address] [phone]        Marykay Lex, MD .   Specialty:  Cardiology Contact information: [address] [place] [address] [phone]        [facility] Follow up.   Why:  Go to [facility] ED for discussed symptoms of hypertension and stroke Contact information: [address] [place] [address] [phone]          Allergies as of 09/04/2017   No Known Allergies     Medication List    STOP taking these medications   metroNIDAZOLE 500 MG tablet Commonly known as:  FLAGYL     TAKE these medications   acetaminophen 325 MG  tablet Commonly known as:  TYLENOL Take 2 tablets (650 mg total) by mouth every 6 (six) hours as needed for mild pain or headache.   carvedilol 12.5 MG tablet Commonly known as:  COREG Take 1 tablet (12.5 mg total) by mouth daily.   hydrALAZINE 25 MG tablet Commonly known as:  APRESOLINE Take 3 tablets (75 mg total) by mouth every 8 (eight) hours. For high blood pressure   INVEGA SUSTENNA 156 MG/ML Susy injection Generic drug:  paliperidone Inject into the muscle.   isosorbide mononitrate 30 MG 24 hr tablet Commonly known as:  IMDUR Take 3 tablets (90 mg total) by mouth daily. For chest pain   isosorbide mononitrate 60 MG 24 hr tablet Commonly known as:  IMDUR TAKE 1 AND 1/2 TABLETS BY MOUTH EVERY DAY   OLANZapine zydis 5 MG disintegrating tablet Commonly known as:  ZYPREXA Take 1 tablet (5 mg total) by mouth 2 (two) times daily. For mood control   traZODone 100 MG tablet Commonly known as:  DESYREL Take 1 tablet (100 mg total) by mouth at bedtime as needed and may repeat dose one time if needed for sleep. Take 1 tablet (100 mg) by mouth at bedtime as needed: For sleep   triamterene-hydrochlorothiazide 37.5-25 MG tablet Commonly known as:  MAXZIDE-25 Take  1 tablet by mouth daily. For high blood pressure     ASK your doctor about these medications   fluconazole 150 MG tablet Commonly known as:  DIFLUCAN Take 1 tablet (150 mg total) by mouth once for 1 dose. Can take additional dose three days later if symptoms persist Ask about: Should I take this medication?       Steward Drone  Certified Nurse-Midwife 09/04/2017  12:14 AM

## 2017-09-04 NOTE — ED Triage Notes (Signed)
Pt presents stating she is "pregnant and needs to be checked out" [facility] for same where she was told she is NOT pregnant... Pt was also reporting vaginal itching and was dx with yeast infection. Pt has hx of schizophrenia. Pt is refusing all labs in triage.

## 2017-09-05 ENCOUNTER — Ambulatory Visit: Payer: Medicare HMO | Admitting: Family Medicine

## 2017-09-10 ENCOUNTER — Telehealth: Payer: Self-pay

## 2017-09-10 NOTE — Telephone Encounter (Signed)
Patient called in seeming very confused and would not tell me what she needed other than she just needed to get a message to Dr. Herbie BaltimoreHarding and wanted to talk to him or his office.

## 2017-09-11 ENCOUNTER — Encounter (HOSPITAL_COMMUNITY): Payer: Self-pay | Admitting: *Deleted

## 2017-09-11 ENCOUNTER — Other Ambulatory Visit: Payer: Self-pay

## 2017-09-11 ENCOUNTER — Inpatient Hospital Stay (HOSPITAL_COMMUNITY)
Admission: AD | Admit: 2017-09-11 | Discharge: 2017-09-11 | Disposition: A | Discharge status: Home / Self Care | Payer: Medicare HMO | Source: Ambulatory Visit | Attending: Obstetrics and Gynecology | Admitting: Obstetrics and Gynecology

## 2017-09-11 ENCOUNTER — Other Ambulatory Visit: Payer: Self-pay | Admitting: Physician Assistant

## 2017-09-11 DIAGNOSIS — Z87891 Personal history of nicotine dependence: Secondary | ICD-10-CM | POA: Insufficient documentation

## 2017-09-11 DIAGNOSIS — I10 Essential (primary) hypertension: Secondary | ICD-10-CM

## 2017-09-11 DIAGNOSIS — F209 Schizophrenia, unspecified: Secondary | ICD-10-CM | POA: Diagnosis not present

## 2017-09-11 DIAGNOSIS — Z9119 Patient's noncompliance with other medical treatment and regimen: Secondary | ICD-10-CM | POA: Insufficient documentation

## 2017-09-11 DIAGNOSIS — I129 Hypertensive chronic kidney disease with stage 1 through stage 4 chronic kidney disease, or unspecified chronic kidney disease: Secondary | ICD-10-CM | POA: Insufficient documentation

## 2017-09-11 DIAGNOSIS — Z91199 Patient's noncompliance with other medical treatment and regimen due to unspecified reason: Secondary | ICD-10-CM

## 2017-09-11 DIAGNOSIS — I160 Hypertensive urgency: Secondary | ICD-10-CM | POA: Diagnosis not present

## 2017-09-11 DIAGNOSIS — N898 Other specified noninflammatory disorders of vagina: Secondary | ICD-10-CM

## 2017-09-11 MED ORDER — TRIAMTERENE-HCTZ 37.5-25 MG PO TABS: 1.0000 | ORAL_TABLET | Freq: Every day | ORAL | 0 refills | Status: DC

## 2017-09-11 MED ORDER — CARVEDILOL 12.5 MG PO TABS
12.5000 mg | ORAL_TABLET | Freq: Every day | ORAL | 0 refills | Status: DC
Start: 2017-09-11 — End: 2017-12-12

## 2017-09-11 MED ORDER — HYDRALAZINE HCL 25 MG PO TABS: 75.0000 mg | ORAL_TABLET | Freq: Three times a day (TID) | ORAL | 0 refills | Status: DC

## 2017-09-11 NOTE — MAU Note (Signed)
Pt presents with c/o having vaginal burning & itching since 0530 this morning.  Reports had +UPT yesterday evening and "wants to be checked".

## 2017-09-11 NOTE — MAU Provider Note (Addendum)
Patient Erin Riley is a 67 y.o. G26P3003 non-pregnant female here with a significant history of schizophrenia and Cardiovascular disease. She is here because she says she is pregnant and needs to check on the baby; she is insisting on documentation for the baby that she had in 2016.    History     CSN: [id number]  Arrival date and time: 09/11/17 4098   First Provider Initiated Contact with Patient 09/11/17 [phone]      Chief Complaint  Patient presents with  . Vaginal Burming  . Vaginal Itching   HPI  A review of patient's past visits shows psych in-patient in February 2019, and multiple cardiology appts and PCP appts that have been missed. She is here insisting she is pregnant and that she needs to check on the baby. She was in the MAU for a similar complaint on July 1st and d/c home with RX for Diflucan. She went to the [facility] ED on July 2nd but left after become agitated (per RN notes).   OB History    Gravida  3   Para  3   Term  3   Preterm      AB      Living  3     SAB      TAB      Ectopic      Multiple      Live Births  3           Past Medical History:  Diagnosis Date  . Chronic back pain   . Chronic kidney disease (CKD), stage II (mild)    Class I-II  . Coronary artery disease 04/2009   50% stenosis in the perforator of LAD; catheterization was for an abnormal Myoview in January 2000 showing anterior and inferolateral ischemia.  . Diverticulitis   . 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 > 3 subsequent Echos show improved/normal EF with moderate to severe concentric LVH and diastolic dysfunction with LVOT/intracavitary gradient --> 06/2016: Severe LVH.  Vigorous EF, 65-70%.?? Gr 1 DD. Mild AS.  [name] History of schizophrenia    However I am not sure about the validity of this. She is not on any medications.  . Hyperlipidemia   . Hypertension   .  Hypertensive hypertrophic cardiomyopathy: NYHA class II:  Echo: Severe concentric LVH with LV OT gradient; essentially preserved EF with diastolic dysfunction 02/15/2013   Echo 06/2016: Severe Concentric LVH. Vigorous EF 65-70%. ~ Gr I DD.   . Mild aortic stenosis by prior echocardiography    Echo 06/2016: Mild AS (Mean Gradient 15 mmHg); has had prior Mod-Severe MR (not seen on current echo)  . PAD (peripheral artery disease) [facility]) March 2013   Lower extremity Dopplers: R. SFA 50-60%, R. PTA proximally occluded with distal reconstitution;; L. common iliac ~50%, L. SFA 50-70% stenosis, L. PTA < 50%  . Schizophrenia (HCC)   . Urinary tract infection     Past Surgical History:  Procedure Laterality Date  . BUNIONECTOMY    . carotid doppler  05/29/2011   left bulb/prox ICA moderate amtfibrous plaque with no evidence significant reduction.,right bulb /proximal ICA normal patency  . DOPPLER ECHOCARDIOGRAPHY  May 2014   EF 50-55%; severe concentric LVH; only grade 1 diastolic dysfunction. Mild aortic sclerosis - with LVOT /intracavitary gradient of roughly 20 mmHg mean. Mild to moderately dilated LA;; previously reported MR not seen  .  DOPPLER ECHOCARDIOGRAPHY  02/02/2009   mod to severe regurg mitral, EF 20 TO 25%, tricuspid severe regurg. ,pulmonic moderate regurg.  [name] DOPPLER ECHOCARDIOGRAPHY  06/2016   Severe LVH.  Vigorous EF of 65-70%.  No RWMA. ~Only grade 1 diastolic dysfunction.  Mild aortic stenosis (mean gradient 15 mmHg)  . lower extremity doppler  05/29/2011   right SFA 50% to 59% diameter reduction,right posterior tibal atreery occlusive disease,reconstituting distally, left common illiac<50%,left SFA 50 to70%,left post. tibial <50%  . NM MYOCAR PERF WALL MOTION  03/2009   Persantine; EF 51%-both anterior and inferolateral ischemia    Family History  Problem Relation Age of Onset  . Hypertension Mother   . Breast cancer Neg Hx     Social History   Tobacco Use  . Smoking  status: Former Smoker    Types: Cigarettes    Last attempt to quit: 05/14/2002    Years since quitting: 15.3  . Smokeless tobacco: Never Used  Substance Use Topics  . Alcohol use: No  . Drug use: No    Allergies: No Known Allergies  Medications Prior to Admission  Medication Sig Dispense Refill Last Dose  . acetaminophen (TYLENOL) 325 MG tablet Take 2 tablets (650 mg total) by mouth every 6 (six) hours as needed for mild pain or headache.   Unknown at Unknown time  . carvedilol (COREG) 12.5 MG tablet Take 1 tablet (12.5 mg total) by mouth daily. 30 tablet 0 Unknown at Unknown time  . hydrALAZINE (APRESOLINE) 25 MG tablet Take 3 tablets (75 mg total) by mouth every 8 (eight) hours. For high blood pressure 12 tablet 0 Unknown at Unknown time  . isosorbide mononitrate (IMDUR) 30 MG 24 hr tablet Take 3 tablets (90 mg total) by mouth daily. For chest pain 12 tablet 0 Unknown at Unknown time  . isosorbide mononitrate (IMDUR) 60 MG 24 hr tablet TAKE 1 AND 1/2 TABLETS BY MOUTH EVERY DAY 45 tablet 0 Unknown at Unknown time  . OLANZapine zydis (ZYPREXA) 5 MG disintegrating tablet Take 1 tablet (5 mg total) by mouth 2 (two) times daily. For mood control 60 tablet 0 Unknown at Unknown time  . paliperidone (INVEGA SUSTENNA) 156 MG/ML SUSY injection Inject into the muscle.   Unknown at Unknown time  . traZODone (DESYREL) 100 MG tablet Take 1 tablet (100 mg total) by mouth at bedtime as needed and may repeat dose one time if needed for sleep. Take 1 tablet (100 mg) by mouth at bedtime as needed: For sleep 30 tablet 0 Unknown at Unknown time  . triamterene-hydrochlorothiazide (MAXZIDE-25) 37.5-25 MG tablet Take 1 tablet by mouth daily. For high blood pressure 30 tablet 0 Unknown at Unknown time    Review of Systems  Constitutional: Negative.   HENT: Negative.   Respiratory: Negative.   Cardiovascular: Negative.   Gastrointestinal: Negative.   Genitourinary: Negative.   Musculoskeletal: Negative.    Neurological: Negative.   Hematological: Negative.   Psychiatric/Behavioral: Negative.    Physical Exam   Blood pressure (!) 191/101, temperature 97.6 F (36.4 C), temperature source Oral, resp. rate 20, height 5\' 8"  (1.727 m), weight 235 lb (106.6 kg), last menstrual period 05/10/2013, SpO2 95 %.  Physical Exam  Constitutional: She is oriented to person, place, and time. She appears well-developed and well-nourished. No distress.  HENT:  Head: Normocephalic.  Eyes: Pupils are equal, round, and reactive to light.  Cardiovascular:  Murmur heard. Respiratory: Effort normal and breath sounds normal. No respiratory distress. She has no  wheezes. She has no rales.  Musculoskeletal: Normal range of motion.  Neurological: She is alert and oriented to person, place, and time.  Skin: Skin is warm. She is not diaphoretic.  Psychiatric: She is withdrawn. She is not combative. Thought content is paranoid. She expresses impulsivity. She expresses no homicidal and no suicidal ideation.   MAU Course  Procedures  MDM Discussed case with Dr. Vergie LivingPickens, who recommends EKG now. If EKG shows no changes, will discharge patient on 12.5 of coreg. If new changes, [facility].   Patient had a full STD work up on 7/1: which was negative Discussed BP readings with [facility]Kohut [facility], patient is asymptomatic. Ok to DC home with refills on BP medications Social work consult today however patient walked out of the room and says she is ready to go home EKG consistent with previous reading 2 weeks ago, nothing acute today. Reviewed with Dr. Raynelle FanningJulie Degele.    Assessment and Plan   A:  1. Vaginal irritation   2. Essential hypertension   3. Non-compliance   4. Hypertensive urgency     P:  BP medications refilled Patient walked off the unit Reviewed stroke/MI symptoms  Charlesetta GaribaldiKathryn Lorraine Kooistra 09/11/2017, 7:43 AM   Rasch, Harolyn RutherfordJennifer I, NP 09/11/2017 9:57 AM

## 2017-09-11 NOTE — MAU Note (Signed)
Pt at admissions desk and states she is going to leave, this is ridiculous that we can't just send her meds. Call to Social Work and they will come now.

## 2017-09-11 NOTE — Discharge Instructions (Signed)
Heart Disease Prevention Heart disease is a leading cause of death. There are many things you can do to help prevent heart disease. Be physically active Physical activity is good for your heart. It helps control your blood pressure, cholesterol levels, and weight. Try to be physically active every day. Ask your health care provider what activities are best for you. Be a healthy weight Extra weight can strain your heart and affect your blood pressure and cholesterol levels. Lose weight with diet and exercise if recommended by your health care provider. Eat heart-healthy foods Follow a healthy eating plan as recommended by your health care provider or dietitian. Heart-healthy foods include:  High-fiber foods. These include oat bran, oatmeal, and whole-grain breads and cereals.  Fruits and vegetables.  Avoid:  Alcohol.  Fried foods.  Foods high in saturated fat. These include meats, butter, whole dairy products, shortening, and coconut or palm oil.  Salty foods. These include canned food, luncheon meat, salty snacks, and fast food.  Keep your cholesterol levels under control Cholesterol is a substance that is used for many important functions. When your cholesterol levels are high, cholesterol can stick to the insides of your blood vessels, making them narrow or clog. This can lead to chest pain (angina) and a heart attack. Keep your cholesterol levels under control as recommended by your health care provider. Have your cholesterol checked at least once a year. Target cholesterol levels (in mg/dL) for most people are:  Total cholesterol below 200.  LDL cholesterol below 100.  HDL cholesterol above 40 in men and above 50 in women.  Triglycerides below 150.  Keep your blood pressure under control Having high blood pressure (hypertension) puts you at risk for stroke and other forms of heart disease. Keep your blood pressure under control as recommended by your health care provider. Ask  your health care provider if you need treatment to lower your blood pressure. If you are 18-39 years of age, have your blood pressure checked every 3-5 years. If you are 40 years of age or older, have your blood pressure checked every year. Do not use tobacco products Tobacco smoke can damage your heart and blood vessels. Do not use any tobacco products including cigarettes, chewing tobacco, or electronic cigarettes. If you need help quitting, ask your health care provider. Take medicines as directed Take medicines only as directed by your health care provider. Ask your health care provider whether you should take an aspirin every day. Taking aspirin can help reduce your risk of heart disease and stroke. Where to find more information: To find out more about heart disease, visit the American Heart Association's website at www.americanheart.org This information is not intended to replace advice given to you by your health care provider. Make sure you discuss any questions you have with your health care provider. Document Released: 10/05/2003 Document Revised: 07/21/2015 Document Reviewed: 04/16/2013 Elsevier Interactive Patient Education  2018 Elsevier Inc.  

## 2017-09-11 NOTE — Telephone Encounter (Signed)
No answer, voice mail has not been setup.
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
## 2017-09-11 NOTE — Progress Notes (Signed)
Attempted to contact pt to discuss missed appointments and to discuss talking with son. No answer. Voicemail is not set up.

## 2017-09-11 NOTE — MAU Note (Signed)
Unable to verify medical history, information pulled forward from previous visits. Pt denies history of cardiac issues or hypertension, but verifies she is on b/p meds.

## 2017-09-11 NOTE — MAU Note (Signed)
Social Worker on unit and chart reviewed. Pt in lobby but refuses to come back to her room. Discharge instructions printed and pt convinced to come into triage to discuss instructions. Social Work to file a report but doesn't need to speak to pt. Pt refuses a b/p check prior to discharge. Pt states she will get her medicines but doesn't understand whey we are not giving her prenatal vitamins. Once again reinforce to pt that she is not pregnant, pt laughs and says she has spoken to a lawyer and he will tell her what she needs to do about the fetus. Pt states she is going to leave but wants the rn to print a copy of her records when she had her last baby 4 years ago. Informed pt she has to go to medical records for a copy of her records and pt leaves to do that.

## 2017-09-11 NOTE — MAU Note (Signed)
Pt came out of room and stated she was going to the lobby to wait. Provider tells pt she is going to send rx to pharmacy. Currently waiting on Social Work consult.

## 2017-09-12 ENCOUNTER — Telehealth: Payer: Self-pay | Admitting: Physician Assistant

## 2017-09-12 NOTE — Telephone Encounter (Signed)
Called pt son and told him that [facility] does not require a referral that he could walk her in gave him the address and phone number to the facility. Pt son asked would they help her with getting her a place to live and everything I told him I was unsure of that but they possibly could help with that or lead him in the right direction for that.

## 2017-09-14 ENCOUNTER — Encounter (HOSPITAL_COMMUNITY): Payer: Self-pay

## 2017-09-14 ENCOUNTER — Inpatient Hospital Stay (HOSPITAL_COMMUNITY)
Admission: AD | Admit: 2017-09-14 | Discharge: 2017-09-15 | Disposition: A | Discharge status: Other Acute Inpatient Hospital | Payer: Medicare HMO | Source: Ambulatory Visit | Attending: Emergency Medicine | Admitting: Emergency Medicine

## 2017-09-14 ENCOUNTER — Other Ambulatory Visit: Payer: Self-pay

## 2017-09-14 DIAGNOSIS — Z79899 Other long term (current) drug therapy: Secondary | ICD-10-CM | POA: Insufficient documentation

## 2017-09-14 DIAGNOSIS — Z87891 Personal history of nicotine dependence: Secondary | ICD-10-CM | POA: Insufficient documentation

## 2017-09-14 DIAGNOSIS — I129 Hypertensive chronic kidney disease with stage 1 through stage 4 chronic kidney disease, or unspecified chronic kidney disease: Secondary | ICD-10-CM | POA: Diagnosis not present

## 2017-09-14 DIAGNOSIS — R4182 Altered mental status, unspecified: Secondary | ICD-10-CM | POA: Diagnosis present

## 2017-09-14 DIAGNOSIS — N183 Chronic kidney disease, stage 3 (moderate): Secondary | ICD-10-CM | POA: Diagnosis not present

## 2017-09-14 DIAGNOSIS — Z046 Encounter for general psychiatric examination, requested by authority: Secondary | ICD-10-CM | POA: Insufficient documentation

## 2017-09-14 DIAGNOSIS — F22 Delusional disorders: Secondary | ICD-10-CM | POA: Insufficient documentation

## 2017-09-14 DIAGNOSIS — I2510 Atherosclerotic heart disease of native coronary artery without angina pectoris: Secondary | ICD-10-CM | POA: Diagnosis not present

## 2017-09-14 DIAGNOSIS — F201 Disorganized schizophrenia: Secondary | ICD-10-CM | POA: Insufficient documentation

## 2017-09-14 LAB — URINALYSIS, ROUTINE W REFLEX MICROSCOPIC
BACTERIA UA: NONE SEEN
Bilirubin Urine: NEGATIVE
Glucose, UA: NEGATIVE mg/dL
Hgb urine dipstick: NEGATIVE
Ketones, ur: NEGATIVE mg/dL
Nitrite: NEGATIVE
PH: 6 (ref 5.0–8.0)
Protein, ur: 30 mg/dL — AB
SPECIFIC GRAVITY, URINE: 1.016 (ref 1.005–1.030)

## 2017-09-14 LAB — CBC WITH DIFFERENTIAL/PLATELET
BASOS PCT: 0 %
Basophils Absolute: 0 10*3/uL (ref 0.0–0.1)
Eosinophils Absolute: 0.2 10*3/uL (ref 0.0–0.7)
Eosinophils Relative: 3 %
HEMATOCRIT: 39 % (ref 36.0–46.0)
HEMOGLOBIN: 13 g/dL (ref 12.0–15.0)
LYMPHS ABS: 2.7 10*3/uL (ref 0.7–4.0)
LYMPHS PCT: 40 %
MCH: 31.1 pg (ref 26.0–34.0)
MCHC: 33.3 g/dL (ref 30.0–36.0)
MCV: 93.3 fL (ref 78.0–100.0)
MONOS PCT: 2 %
Monocytes Absolute: 0.1 10*3/uL (ref 0.1–1.0)
NEUTROS ABS: 3.7 10*3/uL (ref 1.7–7.7)
Neutrophils Relative %: 55 %
Platelets: 270 10*3/uL (ref 150–400)
RBC: 4.18 MIL/uL (ref 3.87–5.11)
RDW: 13.2 % (ref 11.5–15.5)
WBC: 6.8 10*3/uL (ref 4.0–10.5)

## 2017-09-14 LAB — COMPREHENSIVE METABOLIC PANEL
ALBUMIN: 3.8 g/dL (ref 3.5–5.0)
ALK PHOS: 84 U/L (ref 38–126)
ALT: 17 U/L (ref 0–44)
ANION GAP: 9 (ref 5–15)
AST: 21 U/L (ref 15–41)
BILIRUBIN TOTAL: 0.5 mg/dL (ref 0.3–1.2)
BUN: 19 mg/dL (ref 8–23)
CHLORIDE: 105 mmol/L (ref 98–111)
CO2: 24 mmol/L (ref 22–32)
Calcium: 9.7 mg/dL (ref 8.9–10.3)
Creatinine, Ser: 1.08 mg/dL — ABNORMAL HIGH (ref 0.44–1.00)
GFR calc Af Amer: >60 mL/min (ref >60)
GFR calc non Af Amer: 52 mL/min — ABNORMAL LOW (ref >60)
Glucose, Bld: 99 mg/dL (ref 70–99)
POTASSIUM: 4.2 mmol/L (ref 3.5–5.1)
Sodium: 138 mmol/L (ref 135–145)
Total Protein: 7.6 g/dL (ref 6.5–8.1)

## 2017-09-14 MED ORDER — CARVEDILOL 12.5 MG PO TABS
12.5000 mg | ORAL_TABLET | Freq: Every day | ORAL | Status: DC
  Administered 2017-09-15: 12.5 mg via ORAL
  Filled 2017-09-14: qty 1

## 2017-09-14 MED ORDER — HYDRALAZINE HCL 25 MG PO TABS
75.0000 mg | ORAL_TABLET | Freq: Once | ORAL | Status: AC
  Administered 2017-09-14: 75 mg via ORAL
  Filled 2017-09-14: qty 1

## 2017-09-14 MED ORDER — OLANZAPINE 5 MG PO TBDP
5.0000 mg | ORAL_TABLET | Freq: Two times a day (BID) | ORAL | Status: DC
  Administered 2017-09-15: 5 mg via ORAL
  Filled 2017-09-14 (×2): qty 1

## 2017-09-14 MED ORDER — ACETAMINOPHEN 500 MG PO TABS
1000.0000 mg | ORAL_TABLET | Freq: Once | ORAL | Status: AC
  Administered 2017-09-14: 1000 mg via ORAL
  Filled 2017-09-14: qty 2

## 2017-09-14 MED ORDER — TRIAMTERENE-HCTZ 37.5-25 MG PO TABS
1.0000 | ORAL_TABLET | Freq: Every day | ORAL | Status: DC
  Administered 2017-09-15: 1 via ORAL
  Filled 2017-09-14: qty 1

## 2017-09-14 MED ORDER — ISOSORBIDE MONONITRATE ER 30 MG PO TB24
90.0000 mg | ORAL_TABLET | Freq: Every day | ORAL | Status: DC
  Administered 2017-09-15: 90 mg via ORAL
  Filled 2017-09-14: qty 3

## 2017-09-14 MED ORDER — HYDRALAZINE HCL 25 MG PO TABS
75.0000 mg | ORAL_TABLET | Freq: Three times a day (TID) | ORAL | Status: DC
  Administered 2017-09-15: 75 mg via ORAL
  Filled 2017-09-14 (×2): qty 3

## 2017-09-14 MED ORDER — LORAZEPAM 1 MG PO TABS
1.0000 mg | ORAL_TABLET | Freq: Once | ORAL | Status: AC
  Administered 2017-09-14: 1 mg via ORAL
  Filled 2017-09-14: qty 1
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
## 2017-09-14 NOTE — Progress Notes (Signed)
[place] [facility] [place]. [facility]. There is no bed available and will let us know when they get one
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
## 2017-09-14 NOTE — Progress Notes (Signed)
Called [facility] and spoke to charge KakaWoody, will accept pt and hold until bed become available in geriatric psy. [facility] called to transport pt

## 2017-09-14 NOTE — Progress Notes (Signed)
[facility]

## 2017-09-14 NOTE — MAU Note (Signed)
Pt. States has been having abd. Pain since last night.  Pt. Denies vaginal bleeding but has had some yellow dc.

## 2017-09-14 NOTE — MAU Provider Note (Addendum)
History     CSN: [id number]  Arrival date and time: 09/14/17 1635   First Provider Initiated Contact with Patient 09/14/17 1732      Chief Complaint  Patient presents with  . Abdominal Pain   Erin Riley is a 67 y.o. [phone] who presents today with abdominal pain. She originally told me that she had some pain because she was pregnant. After I informed her of her negative pregnancy test she then told me she thought she had an infection. She denies any vaginal bleeding.   Abdominal Pain  This is a new problem. The current episode started today. The onset quality is sudden. The problem occurs constantly. The problem has been unchanged. The pain is located in the suprapubic region. The pain is at a severity of 5/10. The abdominal pain does not radiate. Associated symptoms include diarrhea. Pertinent negatives include no dysuria, fever, frequency, nausea or vomiting. Nothing aggravates the pain. The pain is relieved by nothing. She has tried nothing for the symptoms.    OB History    Gravida  3   Para  3   Term  3   Preterm      AB      Living  3     SAB      TAB      Ectopic      Multiple      Live Births  3           Past Medical History:  Diagnosis Date  . Chronic back pain   . Chronic kidney disease (CKD), stage II (mild)    Class I-II  . Coronary artery disease 04/2009   50% stenosis in the perforator of LAD; catheterization was for an abnormal Myoview in January 2000 showing anterior and inferolateral ischemia.  . Diverticulitis   . 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 > 3 subsequent Echos show improved/normal EF with moderate to severe concentric LVH and diastolic dysfunction with LVOT/intracavitary gradient --> 06/2016: Severe LVH.  Vigorous EF, 65-70%.?? Gr 1 DD. Mild AS.  [name]. History of schizophrenia    However I am not sure about the validity of this. She  is not on any medications.  . Hyperlipidemia   . Hypertension   . Hypertensive hypertrophic cardiomyopathy: NYHA class II:  Echo: Severe concentric LVH with LV OT gradient; essentially preserved EF with diastolic dysfunction 02/15/2013   Echo 06/2016: Severe Concentric LVH. Vigorous EF 65-70%. ~ Gr I DD.   . Mild aortic stenosis by prior echocardiography    Echo 06/2016: Mild AS (Mean Gradient 15 mmHg); has had prior Mod-Severe MR (not seen on current echo)  . PAD (peripheral artery disease) [facility](HCC) March 2013   Lower extremity Dopplers: R. SFA 50-60%, R. PTA proximally occluded with distal reconstitution;; L. common iliac ~50%, L. SFA 50-70% stenosis, L. PTA < 50%  . Schizophrenia (HCC)   . Urinary tract infection     Past Surgical History:  Procedure Laterality Date  . BUNIONECTOMY    . carotid doppler  05/29/2011   left bulb/prox ICA moderate amtfibrous plaque with no evidence significant reduction.,right bulb /proximal ICA normal patency  . DOPPLER ECHOCARDIOGRAPHY  May 2014   EF 50-55%; severe concentric LVH; only grade 1 diastolic dysfunction. Mild aortic sclerosis - with LVOT /intracavitary gradient of roughly 20 mmHg mean. Mild to moderately dilated LA;; previously reported MR not seen  .  DOPPLER ECHOCARDIOGRAPHY  02/02/2009   mod to severe regurg mitral, EF 20 TO 25%, tricuspid severe regurg. ,pulmonic moderate regurg.  [name] DOPPLER ECHOCARDIOGRAPHY  06/2016   Severe LVH.  Vigorous EF of 65-70%.  No RWMA. ~Only grade 1 diastolic dysfunction.  Mild aortic stenosis (mean gradient 15 mmHg)  . lower extremity doppler  05/29/2011   right SFA 50% to 59% diameter reduction,right posterior tibal atreery occlusive disease,reconstituting distally, left common illiac<50%,left SFA 50 to70%,left post. tibial <50%  . NM MYOCAR PERF WALL MOTION  03/2009   Persantine; EF 51%-both anterior and inferolateral ischemia    Family History  Problem Relation Age of Onset  . Hypertension Mother   . Breast  cancer Neg Hx     Social History   Tobacco Use  . Smoking status: Former Smoker    Types: Cigarettes    Last attempt to quit: 05/14/2002    Years since quitting: 15.3  . Smokeless tobacco: Never Used  Substance Use Topics  . Alcohol use: No  . Drug use: No    Allergies: No Known Allergies  Medications Prior to Admission  Medication Sig Dispense Refill Last Dose  . acetaminophen (TYLENOL) 325 MG tablet Take 2 tablets (650 mg total) by mouth every 6 (six) hours as needed for mild pain or headache.   Unknown at Unknown time  . carvedilol (COREG) 12.5 MG tablet Take 1 tablet (12.5 mg total) by mouth daily. 30 tablet 0   . hydrALAZINE (APRESOLINE) 25 MG tablet Take 3 tablets (75 mg total) by mouth every 8 (eight) hours. For high blood pressure 12 tablet 0   . isosorbide mononitrate (IMDUR) 30 MG 24 hr tablet Take 3 tablets (90 mg total) by mouth daily. For chest pain 12 tablet 0 Unknown at Unknown time  . isosorbide mononitrate (IMDUR) 60 MG 24 hr tablet TAKE 1 AND 1/2 TABLETS BY MOUTH EVERY DAY 45 tablet 0 Unknown at Unknown time  . OLANZapine zydis (ZYPREXA) 5 MG disintegrating tablet Take 1 tablet (5 mg total) by mouth 2 (two) times daily. For mood control 60 tablet 0 Unknown at Unknown time  . paliperidone (INVEGA SUSTENNA) 156 MG/ML SUSY injection Inject into the muscle.   Unknown at Unknown time  . triamterene-hydrochlorothiazide (MAXZIDE-25) 37.5-25 MG tablet Take 1 tablet by mouth daily. For high blood pressure 30 tablet 0     Review of Systems  Constitutional: Negative for chills and fever.  Gastrointestinal: Positive for abdominal pain and diarrhea. Negative for nausea and vomiting.  Genitourinary: Negative for dysuria, frequency, pelvic pain, vaginal bleeding and vaginal discharge.   Physical Exam   Blood pressure (!) 136/136, temperature 98.2 F (36.8 C), temperature source Oral, resp. rate 20, last menstrual period 05/10/2013, SpO2 97 %.  Physical Exam  Nursing note  and vitals reviewed. Constitutional: She appears well-developed and well-nourished. No distress.  HENT:  Head: Normocephalic.  Cardiovascular: Normal rate.  Respiratory: Effort normal.  GI: Soft. There is no tenderness.  Neurological: She is alert.  Skin: Skin is warm and dry.  Psychiatric: Her affect is blunt and labile. Her speech is tangential. She is slowed. Thought content is paranoid. She exhibits a depressed mood.   Results for orders placed or performed during the hospital encounter of 09/14/17 (from the past 24 hour(s))  Urinalysis, Routine w reflex microscopic     Status: Abnormal   Collection Time: 09/14/17  5:33 PM  Result Value Ref Range   Color, Urine YELLOW YELLOW   APPearance CLEAR CLEAR  Specific Gravity, Urine 1.016 1.005 - 1.030   pH 6.0 5.0 - 8.0   Glucose, UA NEGATIVE NEGATIVE mg/dL   Hgb urine dipstick NEGATIVE NEGATIVE   Bilirubin Urine NEGATIVE NEGATIVE   Ketones, ur NEGATIVE NEGATIVE mg/dL   Protein, ur 30 (A) NEGATIVE mg/dL   Nitrite NEGATIVE NEGATIVE   Leukocytes, UA TRACE (A) NEGATIVE   RBC / HPF 0-5 0 - 5 RBC/hpf   WBC, UA 0-5 0 - 5 WBC/hpf   Bacteria, UA NONE SEEN NONE SEEN   Squamous Epithelial / LPF 6-10 0 - 5  CBC with Differential/Platelet     Status: None   Collection Time: 09/14/17  5:54 PM  Result Value Ref Range   WBC 6.8 4.0 - 10.5 K/uL   RBC 4.18 3.87 - 5.11 MIL/uL   Hemoglobin 13.0 12.0 - 15.0 g/dL   HCT 91.4 78.2 - 95.6 %   MCV 93.3 78.0 - 100.0 fL   MCH 31.1 26.0 - 34.0 pg   MCHC 33.3 30.0 - 36.0 g/dL   RDW 21.3 08.6 - 57.8 %   Platelets 270 150 - 400 K/uL   Neutrophils Relative % 55 %   Neutro Abs 3.7 1.7 - 7.7 K/uL   Lymphocytes Relative 40 %   Lymphs Abs 2.7 0.7 - 4.0 K/uL   Monocytes Relative 2 %   Monocytes Absolute 0.1 0.1 - 1.0 K/uL   Eosinophils Relative 3 %   Eosinophils Absolute 0.2 0.0 - 0.7 K/uL   Basophils Relative 0 %   Basophils Absolute 0.0 0.0 - 0.1 K/uL  Comprehensive metabolic panel     Status: Abnormal    Collection Time: 09/14/17  5:54 PM  Result Value Ref Range   Sodium 138 135 - 145 mmol/L   Potassium 4.2 3.5 - 5.1 mmol/L   Chloride 105 98 - 111 mmol/L   CO2 24 22 - 32 mmol/L   Glucose, Bld 99 70 - 99 mg/dL   BUN 19 8 - 23 mg/dL   Creatinine, Ser 4.69 (H) 0.44 - 1.00 mg/dL   Calcium 9.7 8.9 - 62.9 mg/dL   Total Protein 7.6 6.5 - 8.1 g/dL   Albumin 3.8 3.5 - 5.0 g/dL   AST 21 15 - 41 U/L   ALT 17 0 - 44 U/L   Alkaline Phosphatase 84 38 - 126 U/L   Total Bilirubin 0.5 0.3 - 1.2 mg/dL   GFR calc non Af Amer 52 (L) >60 mL/min   GFR calc Af Amer >60 >60 mL/min   Anion gap 9 5 - 15     MAU Course  Procedures  [facility] Psych is recommending inpatient admission. Patient informed that we would like her to stay, and we are working on finding her a room.   8:20 PM Care turned over to Vonzella Nipple, PA Thressa Sheller 8:21 PM 09/14/17   Rn discussed patient with [facility] charge nurse who states that there is space there for her to hold until an admission facility is determined.  Discussed patient with Dr. Jeraldine Loots. He will accept patient transfer at this time.  1 mg Ativan ordered for patient prior to transfer   Assessment and Plan  A:  Schizophrenia Hypertension   P:  Transfer to [facility] for hold until admission to appropriate psychiatric facility   Marny Lowenstein, PA-C 09/14/2017 8:35 PM

## 2017-09-14 NOTE — Progress Notes (Signed)
Pt meets inpatient criteria per Reola Calkinsravis Money, NP. Referral information has been sent to the following hospitals for [facility]: [facility]  [facility]  [facility]  [facility]  [facility]   CSW will continue to assist with placement needs.  Wells GuilesSarah Fuquan Wilson, LCSW, LCAS Disposition CSW [facility] [phone]

## 2017-09-15 ENCOUNTER — Other Ambulatory Visit: Payer: Self-pay

## 2017-09-15 DIAGNOSIS — F201 Disorganized schizophrenia: Secondary | ICD-10-CM | POA: Diagnosis not present

## 2017-09-15 NOTE — ED Notes (Signed)
IVC papers faxed earlier this am to [facility] as requested.

## 2017-09-15 NOTE — ED Notes (Addendum)
Pt eating snack given - noted to be talking to herself - cursing. Pt ambulated to hallway d/t had requested to use the phone - pt then ambulated back to her room.

## 2017-09-15 NOTE — ED Notes (Signed)
Deputy called and advised is planning to arrive around 1100-11:30 to transport pt to [facility] [place].

## 2017-09-15 NOTE — ED Notes (Addendum)
ALL belongings - 1 labeled belongings bag - Pt wearing her slides - NO Valuables noted in ED - Deputy.

## 2017-09-15 NOTE — Progress Notes (Signed)
LCSW called Kriste BasqueBecky, RN (Patient's nurse) and patient has been accepted to [facility] and will be arriving there after 10 AM.   Moss McKy-Sha Mackensi Mahadeo MSW, LCSW, LCAS Clinical Social Worker 09/15/2017 7:52 AM

## 2017-09-15 NOTE — ED Notes (Signed)
Pt states "my foot hurts" as she lifts her left leg/foot. Offered pt pain med - refuses - states "I want them to take that bone out that is hurting me". Pt then cursed at [employer].

## 2017-09-15 NOTE — ED Notes (Signed)
Pt was informed she was excepted at a facility and asked to sign a consent form for treatment and pt is refusing. Notified Dr Judd Lienelo and he came over to talk with pt. She was still refusing so MD  possible  IVC patient.

## 2017-09-19 ENCOUNTER — Ambulatory Visit: Payer: Medicare HMO | Admitting: Family Medicine

## 2017-09-19 NOTE — Progress Notes (Deleted)
No chief complaint on file.   HPI  4 review of systems  Past Medical History:  Diagnosis Date  . Chronic back pain   . Chronic kidney disease (CKD), stage II (mild)    Class I-II  . Coronary artery disease 04/2009   50% stenosis in the perforator of LAD; catheterization was for an abnormal Myoview in January 2000 showing anterior and inferolateral ischemia.  . Diverticulitis   . 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 > 3 subsequent Echos show improved/normal EF with moderate to severe concentric LVH and diastolic dysfunction with LVOT/intracavitary gradient --> 06/2016: Severe LVH.  Vigorous EF, 65-70%.?? Gr 1 DD. Mild AS.  [name]. History of schizophrenia    However I am not sure about the validity of this. She is not on any medications.  . Hyperlipidemia   . Hypertension   . Hypertensive hypertrophic cardiomyopathy: NYHA class II:  Echo: Severe concentric LVH with LV OT gradient; essentially preserved EF with diastolic dysfunction 02/15/2013   Echo 06/2016: Severe Concentric LVH. Vigorous EF 65-70%. ~ Gr I DD.   . Mild aortic stenosis by prior echocardiography    Echo 06/2016: Mild AS (Mean Gradient 15 mmHg); has had prior Mod-Severe MR (not seen on current echo)  . PAD (peripheral artery disease) [facility](HCC) March 2013   Lower extremity Dopplers: R. SFA 50-60%, R. PTA proximally occluded with distal reconstitution;; L. common iliac ~50%, L. SFA 50-70% stenosis, L. PTA < 50%  . Schizophrenia (HCC)   . Urinary tract infection     Current Outpatient Medications  Medication Sig Dispense Refill  . acetaminophen (TYLENOL) 325 MG tablet Take 2 tablets (650 mg total) by mouth every 6 (six) hours as needed for mild pain or headache.    . carvedilol (COREG) 12.5 MG tablet Take 1 tablet (12.5 mg total) by mouth daily. 30 tablet 0  . hydrALAZINE (APRESOLINE) 25 MG tablet Take 3 tablets (75 mg total) by mouth  every 8 (eight) hours. For high blood pressure 12 tablet 0  . isosorbide mononitrate (IMDUR) 30 MG 24 hr tablet Take 3 tablets (90 mg total) by mouth daily. For chest pain 12 tablet 0  . isosorbide mononitrate (IMDUR) 60 MG 24 hr tablet TAKE 1 AND 1/2 TABLETS BY MOUTH EVERY DAY 45 tablet 0  . OLANZapine zydis (ZYPREXA) 5 MG disintegrating tablet Take 1 tablet (5 mg total) by mouth 2 (two) times daily. For mood control 60 tablet 0  . paliperidone (INVEGA SUSTENNA) 156 MG/ML SUSY injection Inject into the muscle.    . triamterene-hydrochlorothiazide (MAXZIDE-25) 37.5-25 MG tablet Take 1 tablet by mouth daily. For high blood pressure 30 tablet 0   No current facility-administered medications for this visit.     Allergies: No Known Allergies  Past Surgical History:  Procedure Laterality Date  . BUNIONECTOMY    . carotid doppler  05/29/2011   left bulb/prox ICA moderate amtfibrous plaque with no evidence significant reduction.,right bulb /proximal ICA normal patency  . DOPPLER ECHOCARDIOGRAPHY  May 2014   EF 50-55%; severe concentric LVH; only grade 1 diastolic dysfunction. Mild aortic sclerosis - with LVOT /intracavitary gradient of roughly 20 mmHg mean. Mild to moderately dilated LA;; previously reported MR not seen  . DOPPLER ECHOCARDIOGRAPHY  02/02/2009   mod to severe regurg mitral, EF 20 TO 25%, tricuspid severe regurg. ,pulmonic moderate regurg.  [name]. DOPPLER ECHOCARDIOGRAPHY  06/2016   Severe LVH.  Vigorous EF of 65-70%.  No RWMA. ~Only grade 1 diastolic dysfunction.  Mild aortic stenosis (mean gradient 15 mmHg)  . lower extremity doppler  05/29/2011   right SFA 50% to 59% diameter reduction,right posterior tibal atreery occlusive disease,reconstituting distally, left common illiac<50%,left SFA 50 to70%,left post. tibial <50%  . NM MYOCAR PERF WALL MOTION  03/2009   Persantine; EF 51%-both anterior and inferolateral ischemia    Social History   Socioeconomic History  . Marital status:  Single    Spouse name: Not on file  . Number of children: 7  . Years of education: Not on file  . Highest education level: Not on file  Occupational History  . Not on file  Social Needs  . Financial resource strain: Not on file  . Food insecurity:    Worry: Not on file    Inability: Not on file  . Transportation needs:    Medical: Not on file    Non-medical: Not on file  Tobacco Use  . Smoking status: Former Smoker    Types: Cigarettes    Last attempt to quit: 05/14/2002    Years since quitting: 15.3  . Smokeless tobacco: Never Used  Substance and Sexual Activity  . Alcohol use: No  . Drug use: No  . Sexual activity: Yes    Birth control/protection: Post-menopausal  Lifestyle  . Physical activity:    Days per week: Not on file    Minutes per session: Not on file  . Stress: Not on file  Relationships  . Social connections:    Talks on phone: Not on file    Gets together: Not on file    Attends religious service: Not on file    Active member of club or organization: Not on file    Attends meetings of clubs or organizations: Not on file    Relationship status: Not on file  Other Topics Concern  . Not on file  Social History Narrative   Now single mother of 2 with one grandchild. She quit smoking roughly 5 years ago and is not so since. She has also stopped drinking alcohol. She does try get routine exercise walking at least a mile 3-4 days a week.    She lives with her 58 year old mother. 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    Family History  Problem Relation Age of Onset  . Hypertension Mother   . Breast cancer Neg Hx      ROS Review of Systems See HPI Constitution: No fevers or chills No malaise No diaphoresis Skin: No rash or itching Eyes: no blurry vision, no double vision GU: no dysuria or hematuria Neuro: no dizziness or headaches * all others reviewed and negative   Objective: There were no vitals filed for this visit.  Physical Exam  Assessment  and Plan There are no diagnoses linked to this encounter.   Erin Riley [employer]

## 2017-09-21 ENCOUNTER — Inpatient Hospital Stay (HOSPITAL_COMMUNITY)
Admission: AD | Admit: 2017-09-21 | Discharge: 2017-09-21 | Discharge status: Against Medical Advice | Payer: Medicare HMO | Source: Ambulatory Visit | Attending: Obstetrics & Gynecology | Admitting: Obstetrics & Gynecology

## 2017-09-21 NOTE — MAU Note (Signed)
Not in lobby x 3 assume patient left

## 2017-09-21 NOTE — MAU Note (Signed)
Not in lobby x2.

## 2017-09-21 NOTE — MAU Note (Signed)
Not in lobby

## 2017-09-22 ENCOUNTER — Other Ambulatory Visit: Payer: Self-pay

## 2017-09-22 ENCOUNTER — Encounter (HOSPITAL_COMMUNITY): Payer: Self-pay | Admitting: *Deleted

## 2017-09-22 ENCOUNTER — Emergency Department (HOSPITAL_COMMUNITY)
Admission: AD | Admit: 2017-09-22 | Discharge: 2017-09-23 | Disposition: A | Discharge status: Other Acute Inpatient Hospital | Payer: Medicare HMO | Source: Ambulatory Visit | Attending: Emergency Medicine | Admitting: Emergency Medicine

## 2017-09-22 DIAGNOSIS — I129 Hypertensive chronic kidney disease with stage 1 through stage 4 chronic kidney disease, or unspecified chronic kidney disease: Secondary | ICD-10-CM | POA: Insufficient documentation

## 2017-09-22 DIAGNOSIS — F209 Schizophrenia, unspecified: Secondary | ICD-10-CM | POA: Insufficient documentation

## 2017-09-22 DIAGNOSIS — Z79899 Other long term (current) drug therapy: Secondary | ICD-10-CM | POA: Diagnosis not present

## 2017-09-22 DIAGNOSIS — F458 Other somatoform disorders: Secondary | ICD-10-CM | POA: Diagnosis not present

## 2017-09-22 DIAGNOSIS — R109 Unspecified abdominal pain: Secondary | ICD-10-CM | POA: Diagnosis not present

## 2017-09-22 DIAGNOSIS — N182 Chronic kidney disease, stage 2 (mild): Secondary | ICD-10-CM | POA: Insufficient documentation

## 2017-09-22 DIAGNOSIS — Z87891 Personal history of nicotine dependence: Secondary | ICD-10-CM | POA: Insufficient documentation

## 2017-09-22 DIAGNOSIS — I2510 Atherosclerotic heart disease of native coronary artery without angina pectoris: Secondary | ICD-10-CM | POA: Diagnosis not present

## 2017-09-22 DIAGNOSIS — F22 Delusional disorders: Secondary | ICD-10-CM

## 2017-09-22 LAB — CBC
HEMATOCRIT: 39.2 % (ref 36.0–46.0)
HEMOGLOBIN: 12.4 g/dL (ref 12.0–15.0)
MCH: 30.4 pg (ref 26.0–34.0)
MCHC: 31.6 g/dL (ref 30.0–36.0)
MCV: 96.1 fL (ref 78.0–100.0)
Platelets: 269 10*3/uL (ref 150–400)
RBC: 4.08 MIL/uL (ref 3.87–5.11)
RDW: 13.1 % (ref 11.5–15.5)
WBC: 6.3 10*3/uL (ref 4.0–10.5)

## 2017-09-22 LAB — COMPREHENSIVE METABOLIC PANEL
ALK PHOS: 73 U/L (ref 38–126)
ALT: 20 U/L (ref 0–44)
ANION GAP: 7 (ref 5–15)
AST: 21 U/L (ref 15–41)
Albumin: 3.4 g/dL — ABNORMAL LOW (ref 3.5–5.0)
BILIRUBIN TOTAL: 0.7 mg/dL (ref 0.3–1.2)
BUN: 34 mg/dL — ABNORMAL HIGH (ref 8–23)
CALCIUM: 9.8 mg/dL (ref 8.9–10.3)
CO2: 28 mmol/L (ref 22–32)
CREATININE: 1.68 mg/dL — AB (ref 0.44–1.00)
Chloride: 102 mmol/L (ref 98–111)
GFR calc non Af Amer: 31 mL/min — ABNORMAL LOW (ref >60)
GFR, EST AFRICAN AMERICAN: 36 mL/min — AB (ref >60)
Glucose, Bld: 106 mg/dL — ABNORMAL HIGH (ref 70–99)
Potassium: 4.6 mmol/L (ref 3.5–5.1)
SODIUM: 137 mmol/L (ref 135–145)
Total Protein: 7.3 g/dL (ref 6.5–8.1)

## 2017-09-22 LAB — URINALYSIS, ROUTINE W REFLEX MICROSCOPIC
BILIRUBIN URINE: NEGATIVE
GLUCOSE, UA: NEGATIVE mg/dL
HGB URINE DIPSTICK: NEGATIVE
KETONES UR: NEGATIVE mg/dL
Leukocytes, UA: NEGATIVE
NITRITE: NEGATIVE
PH: 7 (ref 5.0–8.0)
Protein, ur: NEGATIVE mg/dL
Specific Gravity, Urine: 1.012 (ref 1.005–1.030)

## 2017-09-22 LAB — SALICYLATE LEVEL

## 2017-09-22 LAB — RAPID URINE DRUG SCREEN, HOSP PERFORMED
Amphetamines: NOT DETECTED
Barbiturates: NOT DETECTED
Benzodiazepines: NOT DETECTED
COCAINE: NOT DETECTED
OPIATES: NOT DETECTED
Tetrahydrocannabinol: NOT DETECTED

## 2017-09-22 LAB — POCT PREGNANCY, URINE: Preg Test, Ur: NEGATIVE

## 2017-09-22 LAB — ACETAMINOPHEN LEVEL: Acetaminophen (Tylenol), Serum: <10 ug/mL — ABNORMAL LOW (ref 10–30)

## 2017-09-22 LAB — ETHANOL: Alcohol, Ethyl (B): <10 mg/dL (ref <10)

## 2017-09-22 MED ORDER — LORAZEPAM 1 MG PO TABS
1.0000 mg | ORAL_TABLET | Freq: Once | ORAL | Status: AC
  Administered 2017-09-22: 1 mg via ORAL
  Filled 2017-09-22: qty 1

## 2017-09-22 MED ORDER — TRIAMTERENE-HCTZ 37.5-25 MG PO TABS
1.0000 | ORAL_TABLET | Freq: Every day | ORAL | Status: DC
  Administered 2017-09-22: 1 via ORAL
  Filled 2017-09-22 (×2): qty 1

## 2017-09-22 MED ORDER — ISOSORBIDE MONONITRATE ER 30 MG PO TB24
30.0000 mg | ORAL_TABLET | Freq: Every day | ORAL | Status: DC
  Administered 2017-09-22: 30 mg via ORAL
  Filled 2017-09-22 (×2): qty 1

## 2017-09-22 MED ORDER — HYDRALAZINE HCL 25 MG PO TABS
75.0000 mg | ORAL_TABLET | Freq: Three times a day (TID) | ORAL | Status: DC
  Administered 2017-09-22: 75 mg via ORAL
  Filled 2017-09-22: qty 3

## 2017-09-22 MED ORDER — OLANZAPINE 5 MG PO TABS
15.0000 mg | ORAL_TABLET | Freq: Every day | ORAL | Status: DC
  Administered 2017-09-22: 15 mg via ORAL
  Filled 2017-09-22: qty 3

## 2017-09-22 MED ORDER — CARVEDILOL 12.5 MG PO TABS
12.5000 mg | ORAL_TABLET | Freq: Every day | ORAL | Status: DC
  Administered 2017-09-22: 12.5 mg via ORAL
  Filled 2017-09-22 (×2): qty 1

## 2017-09-22 MED ORDER — LORAZEPAM 1 MG PO TABS
1.0000 mg | ORAL_TABLET | Freq: Every day | ORAL | Status: DC
  Administered 2017-09-22: 1 mg via ORAL
  Filled 2017-09-22: qty 1

## 2017-09-22 NOTE — ED Notes (Signed)
Pt talking to herself at this time.

## 2017-09-22 NOTE — ED Notes (Signed)
Pt woke briefly for Dr Donnald GarrePfeiffer then returned to sleeping. Pt voiced understanding she is aware her meal tray is on bedside table.

## 2017-09-22 NOTE — Progress Notes (Signed)
Patient meets criteria for inpatient treatment. There are [facility]. CSW faxed referrals to the following facilities for review.  [address], [facility] [place], [address] [place], [address] [place], [address], [facility], [address], [place], [address], [place], [facility], [facility], [facility], [facility], [facility] and [place].   TTS will continue to seek bed placement.     Moss McKy-sha Donathan Buller, MSW, LCSW, LCAS 09/22/2017 12:47 PM

## 2017-09-22 NOTE — ED Notes (Addendum)
Pharm Tech called son attempting to verify pt's meds. Son advised pt lives w/him and his brother. States pt does not tell them what's going on w/her and when she comes to the hospital. Son advised pt was d/c'd from [facility] 09/21/17 and he is unsure of pt's meds. Son questioned if pt drove herself d/t "she lost her last car". Advised him that [facility]. Pharm Tech advised she will attempt to reach out to [facility] so may verify pt's meds. Staffing Office aware of need for [facility].
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
## 2017-09-22 NOTE — ED Notes (Signed)
Dr Donnald GarrePfeiffer aware pt has arrived.

## 2017-09-22 NOTE — Progress Notes (Addendum)
10:20am CSW received call from MAU regarding patient. Patient has a history of schizophrenia and frequent visits to MAU due to her belief that she is currently pregnant. CSW spoke with CNM to have telepsych consult placed to evaluate patient's mental status.  11:00am CSW received call from RN in MAU regarding disposition plan from TTS for patient. Patient meets inpatient criteria at this time. CSW spoke with Dannielle Huhanny, [facility] to confirm that the CSW there will begin seeking placement for patient.  1:45pm CSW spoke with RN in MAU to determine that [facility]. No further [facility] CS[facility] intervention needed at this time.  Edwin Dadaarol Caetano Oberhaus, MSW, LCSW-A Clinical Social Worker [facility] [phone]
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
## 2017-09-22 NOTE — ED Notes (Signed)
Regular Diet was ordered for Dinner. 

## 2017-09-22 NOTE — MAU Note (Addendum)
[facility] rece[facility] consult to TTS order.   @1026  consult to TTS in progress.  @1104  [facility] to confirm patient disposition of meeting criteria for inpatient status

## 2017-09-22 NOTE — ED Notes (Signed)
Pt wanded by security. 

## 2017-09-22 NOTE — ED Notes (Signed)
Pt wanded by Security.  

## 2017-09-22 NOTE — MAU Note (Addendum)
Rn left message for weekend social worker [phone].  @1004  social work returned call. Report given. SW coming down to see patient.  @1104  [facility].

## 2017-09-22 NOTE — ED Notes (Signed)
Pt noted to be cooperative for labs to be drawn - voiced understanding of need for urine specimen. Offered for pt to eat - states she will later then returned to sleeping.

## 2017-09-22 NOTE — MAU Provider Note (Signed)
History     CSN: [id number]  Arrival date and time: 09/22/17 [id number]   First Provider Initiated Contact with Patient 09/22/17 [phone]      Chief Complaint  Patient presents with  . Abdominal Pain   Erin LoraMae H Riley is a 67 y.o. Who presents today because she believes she is having contractions. She states that she has been getting prenatal care here, and that she has been here several times recently. She doesn't understand why we can't pick up her contractions and keep sending her home. She reports that she lives at home with her 2 sons, and they help take care of her. She was in the hospital recently (IVC), and reports that she became upset with that hospital and had to leave.   Abdominal Pain  This is a new problem. The current episode started today. The onset quality is sudden. The problem occurs intermittently. The problem has been unchanged. The pain is located in the generalized abdominal region. The pain is severe. The quality of the pain is sharp. The abdominal pain does not radiate. Associated symptoms include constipation and dysuria. Pertinent negatives include no fever, nausea or vomiting. The pain is aggravated by bowel movement.    Past Medical History:  Diagnosis Date  . Chronic back pain   . Chronic kidney disease (CKD), stage II (mild)    Class I-II  . Coronary artery disease 04/2009   50% stenosis in the perforator of LAD; catheterization was for an abnormal Myoview in January 2000 showing anterior and inferolateral ischemia.  . Diverticulitis   . 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 > 3 subsequent Echos show improved/normal EF with moderate to severe concentric LVH and diastolic dysfunction with LVOT/intracavitary gradient --> 06/2016: Severe LVH.  Vigorous EF, 65-70%.?? Gr 1 DD. Mild AS.  [name]. History of schizophrenia    However I am not sure about the validity of this. She is not on  any medications.  . Hyperlipidemia   . Hypertension   . Hypertensive hypertrophic cardiomyopathy: NYHA class II:  Echo: Severe concentric LVH with LV OT gradient; essentially preserved EF with diastolic dysfunction 02/15/2013   Echo 06/2016: Severe Concentric LVH. Vigorous EF 65-70%. ~ Gr I DD.   . Mild aortic stenosis by prior echocardiography    Echo 06/2016: Mild AS (Mean Gradient 15 mmHg); has had prior Mod-Severe MR (not seen on current echo)  . PAD (peripheral artery disease) [facility](HCC) March 2013   Lower extremity Dopplers: R. SFA 50-60%, R. PTA proximally occluded with distal reconstitution;; L. common iliac ~50%, L. SFA 50-70% stenosis, L. PTA < 50%  . Schizophrenia (HCC)   . Urinary tract infection     Past Surgical History:  Procedure Laterality Date  . BUNIONECTOMY    . carotid doppler  05/29/2011   left bulb/prox ICA moderate amtfibrous plaque with no evidence significant reduction.,right bulb /proximal ICA normal patency  . DOPPLER ECHOCARDIOGRAPHY  May 2014   EF 50-55%; severe concentric LVH; only grade 1 diastolic dysfunction. Mild aortic sclerosis - with LVOT /intracavitary gradient of roughly 20 mmHg mean. Mild to moderately dilated LA;; previously reported MR not seen  . DOPPLER ECHOCARDIOGRAPHY  02/02/2009   mod to severe regurg mitral, EF 20 TO 25%, tricuspid severe regurg. ,pulmonic moderate regurg.  [name]. DOPPLER ECHOCARDIOGRAPHY  06/2016   Severe LVH.  Vigorous EF of 65-70%.  No RWMA. ~Only grade 1 diastolic dysfunction.  Mild aortic stenosis (mean gradient 15 mmHg)  . lower extremity doppler  05/29/2011   right SFA 50% to 59% diameter reduction,right posterior tibal atreery occlusive disease,reconstituting distally, left common illiac<50%,left SFA 50 to70%,left post. tibial <50%  . NM MYOCAR PERF WALL MOTION  03/2009   Persantine; EF 51%-both anterior and inferolateral ischemia    Family History  Problem Relation Age of Onset  . Hypertension Mother   . Breast cancer Neg  Hx     Social History   Tobacco Use  . Smoking status: Former Smoker    Types: Cigarettes    Last attempt to quit: 05/14/2002    Years since quitting: 15.3  . Smokeless tobacco: Never Used  Substance Use Topics  . Alcohol use: No  . Drug use: No    Allergies: No Known Allergies  Medications Prior to Admission  Medication Sig Dispense Refill Last Dose  . acetaminophen (TYLENOL) 325 MG tablet Take 2 tablets (650 mg total) by mouth every 6 (six) hours as needed for mild pain or headache.   Unknown at Unknown time  . carvedilol (COREG) 12.5 MG tablet Take 1 tablet (12.5 mg total) by mouth daily. 30 tablet 0   . hydrALAZINE (APRESOLINE) 25 MG tablet Take 3 tablets (75 mg total) by mouth every 8 (eight) hours. For high blood pressure 12 tablet 0   . isosorbide mononitrate (IMDUR) 30 MG 24 hr tablet Take 3 tablets (90 mg total) by mouth daily. For chest pain 12 tablet 0 Unknown at Unknown time  . isosorbide mononitrate (IMDUR) 60 MG 24 hr tablet TAKE 1 AND 1/2 TABLETS BY MOUTH EVERY DAY 45 tablet 0 Unknown at Unknown time  . OLANZapine zydis (ZYPREXA) 5 MG disintegrating tablet Take 1 tablet (5 mg total) by mouth 2 (two) times daily. For mood control 60 tablet 0 Unknown at Unknown time  . paliperidone (INVEGA SUSTENNA) 156 MG/ML SUSY injection Inject into the muscle.   Unknown at Unknown time  . triamterene-hydrochlorothiazide (MAXZIDE-25) 37.5-25 MG tablet Take 1 tablet by mouth daily. For high blood pressure 30 tablet 0     Review of Systems  Constitutional: Positive for chills ("I had a cold sweat, and I came on in"). Negative for fever.  Gastrointestinal: Positive for abdominal pain and constipation. Negative for nausea and vomiting.  Genitourinary: Positive for dysuria.  All other systems reviewed and are negative.  Physical Exam   Blood pressure (!) 126/95, pulse 81, temperature 98.2 F (36.8 C), temperature source Oral, resp. rate 18, weight 238 lb (108 kg), last menstrual  period 05/10/2013, SpO2 97 %.  Physical Exam  Nursing note and vitals reviewed. Constitutional: She is oriented to person, place, and time. She appears well-developed and well-nourished. No distress.  HENT:  Head: Normocephalic.  Cardiovascular: Normal rate.  Respiratory: Effort normal.  GI: Soft. There is no tenderness. There is no rebound.  Neurological: She is alert and oriented to person, place, and time.  Skin: Skin is warm and dry.  Psychiatric: She is slowed. Thought content is delusional. She exhibits abnormal recent memory and abnormal remote memory.    MAU Course  Procedures  MDM Consult to SW Consult to TTS  10:28 AM Tele-psyc consult has begun. SW is here as well, and will see the patient when tele-psych is completed.  11:06 AM per TTS patient meets criteria for inpatient admission. The will attempt to find geriatric psych unit for her.    Patient holding for a bed. DW  and they  will hold her there until she can get a bed.   1:06 PM Dr. Particia Nearing accepts patient at [facility] ED to hold for psych bed.   Order placed for 1mg  of ativan. Patient has had this before without adverse effects. She is starting to become upset that we are wanting to keep her again. She states that she has a doctor's appointment tomorrow, and company coming to visit so she cannot stay any longer. Patient reassured that we want to keep her for her wellbeing.   1:07 PM [facility] transport here to take patient to [facility] ED.   1:11 PM Ivy, with [facility] reports that patient will need IVC if she attempts to leave.   1:13 PM CSW on site, and can assist with IVC papers if needed.   1:15 PM patient has calmed down, and is now in agreement to go to [facility] ED at this time. Patient is ambulating off the unit with transport.    Assessment and Plan   1. Delusional disorder [facility])    Patient transferred to [facility] ED to hold for an inpatient psych bed.   Thressa Sheller 09/22/2017, 9:56 AM

## 2017-09-22 NOTE — MAU Note (Addendum)
[facility] called (ex [phone]) notified of transfer. [facility] for report.  Becky [facility] at [facility] (ex [phone]) notified of patient transfer. Report given. Patient will go to bed F7.  @1320  RN called Kriste BasqueBecky RN back to informed patient just left with [facility] and that Ativan was given prior to transport. [facility] if [facility] tries to leave again needs to be IVC'd.

## 2017-09-22 NOTE — ED Notes (Signed)
Warm blankets placed on pt as requested.

## 2017-09-22 NOTE — MAU Note (Signed)
When asked what is wrong pt just shook her head yes and didn't say anything. When I asked again, she pointed to her abdomen and said "this". I asked if she was having pain and she said yes. 9/10  States it started this morning. Heavy discharge. States she's still having periods and last one was in January. Pt is diaphoretic.

## 2017-09-22 NOTE — ED Notes (Addendum)
Pt [facility] via [facility]. Per report, pt c/o being pregnant and her water breaking. Stated [facility]. Pt noted to be labile. [facility] and Inpt tx is recommended. Pt initially refusing to be transported [facility] then agreed. Pt noted to be Voluntary. Pt arrived wearing personal clothing and shoes. Pt noted w/purse. Pt changed into burgundy scrubs and removed her personal clothing. Clothing and shoes placed in 1 labeled bag and purse placed in another - 2 labeled belongings bags placed in [place].

## 2017-09-23 NOTE — ED Notes (Addendum)
Pt talking to herself stating "What do they mean I can't have no more children?" Pt refusing meds d/t states "I don't need them". States she is leaving and is ready to go home. States "That's discrimination - telling me I can't have no more children". Dr Criss AlvineGoldston completed IVC papers.

## 2017-09-23 NOTE — ED Notes (Signed)
Woke pt to advise has been accepted to [facility] - states "I'm not going back there" - refuses to sign in voluntarily - then rolled over in bed and turned away from RN. IVC paperwork given to Dr Criss AlvineGoldston.

## 2017-09-23 NOTE — ED Notes (Signed)
[facility] aware need LEO to come serve IVC papers.

## 2017-09-23 NOTE — ED Provider Notes (Signed)
10:06 AM Asked by psych to IVC due to psychosis. Patient is currently delusional and doesn't cooperate with exam. Ready for psych admission. IVC paperwork filled out.   Pricilla LovelessGoldston, Jennife Zaucha, MD 09/23/17 1006

## 2017-09-23 NOTE — ED Notes (Signed)
Pt continues to state she is ready to leave. Pt sitting in chair in doorway of exam room. Staff talking w/pt.

## 2017-09-23 NOTE — Progress Notes (Signed)
Patient meets criteria for inpatient treatment. There are [facility] beds [facility]. CSW faxed referrals to the following facilities for review.  [address], [place], [address] [place], [address] [place], [address], [place], [address], [place], [address], [place], [place], [place], [place], [facility], [facility] and [place].   TTS will continue to seek bed placement.   Moss McKy-Sha Shontelle Muska MSW, LCSW, LCAS Clinical Social Worker 09/23/2017 7:50 AM

## 2017-09-23 NOTE — Progress Notes (Signed)
Patient has been accepted to [facility] in [place]. The accepting doctor is Dr. Mikey CollegeGannon. The reporting number is [phone]. She will need to be there by 5 PM. She needs to [place] to [place]. LCSW has notified the patient's nurse of discharging plans.   Moss McKy-Sha Cyler Kappes MSW, LCSW, LCAS Clinical Social Worker 09/23/2017 9:29 AM

## 2017-09-23 NOTE — ED Notes (Signed)
Pt bathing in room.  

## 2017-09-23 NOTE — ED Notes (Addendum)
IVC papers served - copy sent to Medical Records, copy faxed to [facility], original placed in folder for Magistrate, and all 3 sets on clipboard. Left message for [facility] advising papers have been served and [facility] is requesting for pt to arrive prior to 1700. Christine, [facility], aware waiting for call back from [facility] so may advise of time of arrival.

## 2017-09-23 NOTE — ED Notes (Signed)
Left message x 3 on VM of [facility] d/t no one has yet returned call to advise when will be transporting pt.

## 2017-09-23 NOTE — ED Notes (Signed)
Pt asking for items - one at a time - toothpaste and toothbrush and comb given.

## 2017-09-23 NOTE — ED Notes (Signed)
Confirmed Magistrate received IVC papers.

## 2017-09-23 NOTE — ED Notes (Signed)
Regular lunch tray ordered 

## 2017-09-23 NOTE — ED Provider Notes (Signed)
Patient is alert and nontoxic. no Respiratory distress.  All movements coordinated purposeful symmetric.  Vital signs stable.  Patient stable for transport.  EMTALA completed.   Arby BarrettePfeiffer, Efton Thomley, MD 09/23/17 1400

## 2017-09-23 NOTE — ED Notes (Signed)
Pt woke - ambulated to bathroom w/o difficulty and back to room. Pt asking for her shoes d/t c/o feet hurt when ambulating w/o them - stated "They ain't got no laces in them". White sandals given. Voiced appreciation. Pt ate breakfast now has returned to lying down on bed w/eyes closed. Respirations even, unlabored.

## 2017-09-23 NOTE — ED Notes (Addendum)
ALL belongings - 2 labeled belongings bags - No Valuables envelope noted. Deputy. Pt noted to be irritable and still cooperative w/staff and deputy. Pt's son, Gordan PaymentDurwin Hill, aware - per pt's request - being transported to [facility].

## 2017-10-04 ENCOUNTER — Encounter (HOSPITAL_COMMUNITY): Payer: Self-pay | Admitting: Emergency Medicine

## 2017-10-04 ENCOUNTER — Other Ambulatory Visit: Payer: Self-pay

## 2017-10-04 ENCOUNTER — Inpatient Hospital Stay (HOSPITAL_COMMUNITY)
Admission: AD | Admit: 2017-10-04 | Discharge: 2017-10-04 | Disposition: A | Discharge status: Home / Self Care | Payer: Medicare HMO | Source: Ambulatory Visit | Attending: Emergency Medicine | Admitting: Emergency Medicine

## 2017-10-04 ENCOUNTER — Ambulatory Visit: Payer: Medicare HMO | Admitting: Cardiology

## 2017-10-04 DIAGNOSIS — F22 Delusional disorders: Secondary | ICD-10-CM | POA: Insufficient documentation

## 2017-10-04 DIAGNOSIS — I129 Hypertensive chronic kidney disease with stage 1 through stage 4 chronic kidney disease, or unspecified chronic kidney disease: Secondary | ICD-10-CM | POA: Diagnosis not present

## 2017-10-04 DIAGNOSIS — I10 Essential (primary) hypertension: Secondary | ICD-10-CM

## 2017-10-04 DIAGNOSIS — N182 Chronic kidney disease, stage 2 (mild): Secondary | ICD-10-CM | POA: Insufficient documentation

## 2017-10-04 DIAGNOSIS — Z79899 Other long term (current) drug therapy: Secondary | ICD-10-CM | POA: Diagnosis not present

## 2017-10-04 LAB — URINALYSIS, ROUTINE W REFLEX MICROSCOPIC
BACTERIA UA: NONE SEEN
BILIRUBIN URINE: NEGATIVE
Glucose, UA: NEGATIVE mg/dL
HGB URINE DIPSTICK: NEGATIVE
Ketones, ur: NEGATIVE mg/dL
Leukocytes, UA: NEGATIVE
NITRITE: NEGATIVE
PROTEIN: 30 mg/dL — AB
SPECIFIC GRAVITY, URINE: 1.016 (ref 1.005–1.030)
pH: 5 (ref 5.0–8.0)

## 2017-10-04 LAB — BASIC METABOLIC PANEL
ANION GAP: 6 (ref 5–15)
BUN: 17 mg/dL (ref 8–23)
CALCIUM: 9.2 mg/dL (ref 8.9–10.3)
CO2: 25 mmol/L (ref 22–32)
CREATININE: 1.1 mg/dL — AB (ref 0.44–1.00)
Chloride: 110 mmol/L (ref 98–111)
GFR calc non Af Amer: 51 mL/min — ABNORMAL LOW (ref >60)
GFR, EST AFRICAN AMERICAN: 59 mL/min — AB (ref >60)
Glucose, Bld: 91 mg/dL (ref 70–99)
Potassium: 3.4 mmol/L — ABNORMAL LOW (ref 3.5–5.1)
SODIUM: 141 mmol/L (ref 135–145)

## 2017-10-04 LAB — RAPID URINE DRUG SCREEN, HOSP PERFORMED
Amphetamines: NOT DETECTED
BARBITURATES: NOT DETECTED
BENZODIAZEPINES: NOT DETECTED
COCAINE: NOT DETECTED
OPIATES: NOT DETECTED
Tetrahydrocannabinol: NOT DETECTED

## 2017-10-04 LAB — CBC
HEMATOCRIT: 35.9 % — AB (ref 36.0–46.0)
Hemoglobin: 12 g/dL (ref 12.0–15.0)
MCH: 30.8 pg (ref 26.0–34.0)
MCHC: 33.4 g/dL (ref 30.0–36.0)
MCV: 92.3 fL (ref 78.0–100.0)
PLATELETS: 257 10*3/uL (ref 150–400)
RBC: 3.89 MIL/uL (ref 3.87–5.11)
RDW: 13.5 % (ref 11.5–15.5)
WBC: 6.4 10*3/uL (ref 4.0–10.5)

## 2017-10-04 LAB — POCT PREGNANCY, URINE: Preg Test, Ur: NEGATIVE

## 2017-10-04 LAB — ETHANOL: Alcohol, Ethyl (B): <10 mg/dL (ref <10)

## 2017-10-04 MED ORDER — LACTATED RINGERS IV SOLN
INTRAVENOUS | Status: DC
  Administered 2017-10-04 (×2): via INTRAVENOUS

## 2017-10-04 MED ORDER — HYDRALAZINE HCL 20 MG/ML IJ SOLN
10.0000 mg | Freq: Once | INTRAMUSCULAR | Status: AC
  Administered 2017-10-04: 10 mg via INTRAVENOUS
  Filled 2017-10-04: qty 1

## 2017-10-04 MED ORDER — HYDRALAZINE HCL 25 MG PO TABS
25.0000 mg | ORAL_TABLET | Freq: Once | ORAL | Status: AC
  Administered 2017-10-04: 25 mg via ORAL
  Filled 2017-10-04: qty 1

## 2017-10-04 MED ORDER — ACETAMINOPHEN 325 MG PO TABS
650.0000 mg | ORAL_TABLET | Freq: Once | ORAL | Status: AC
  Administered 2017-10-04: 650 mg via ORAL
  Filled 2017-10-04: qty 2

## 2017-10-04 MED ORDER — HYDRALAZINE HCL 25 MG PO TABS
75.0000 mg | ORAL_TABLET | Freq: Three times a day (TID) | ORAL | Status: DC
  Administered 2017-10-04: 75 mg via ORAL
  Filled 2017-10-04 (×3): qty 1
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
## 2017-10-04 NOTE — ED Notes (Addendum)
Patient refused d/c vital signs and to sign for d/c paperwork. States "I will get this blood pressure down on my own, we are not taking it again and I will not sign anything." MD made aware of patient's previous trending BP.  MD made aware patient refused hydralazine.

## 2017-10-04 NOTE — ED Notes (Signed)
Bed: WA09 Expected date:  Expected time:  Means of arrival:  Comments: [facility]

## 2017-10-04 NOTE — BH Assessment (Addendum)
Assessment Note  Erin Riley is an 67 y.o. female who presents to the ED voluntarily. Pt was sent to [facility] from [facility] hospital. Per reports, pt presented to the [facility] hospital with delusions that she is pregnant and going into labor. Pt has a hx of presenting to the ED claiming she is pregnant and having contractions. Upon entering the room, TTS asked the pt why she came to the ED and she stated "I was having contractions." Pt states she went to [facility] and was sent to [facility] for "help." Pt was recently assessed by TTS on 09/22/17 due to a similar presentation related to delusions that she is pregnant. Pt was recently admitted to [facility]. TTS asked the pt when she was d/c from [facility] and pt appeared agitated. Pt stated "I'm not telling you that. I want to leave. I'm not pregnant. I'm not going back to no mental institution. I'm not crazy." TTS asked the pt who she lives with and she refused to disclose.  Pt refusing to engage with this Clinical research associate. Pt denies SI and demands to be d/c. TTS spoke with EDP Vanetta Mulders, MD who states he does not feel the need to IVC the pt as she denies SI, HI, and AVH. TTS provided pt with OPT resources. Pt to be d/c by EDP. Nira Conn, NP contacted and in agreement with disposition.   Diagnosis: Delusional disorder   Past Medical History:  Past Medical History:  Diagnosis Date  . Chronic back pain   . Chronic kidney disease (CKD), stage II (mild)    Class I-II  . Coronary artery disease 04/2009   50% stenosis in the perforator of LAD; catheterization was for an abnormal Myoview in January 2000 showing anterior and inferolateral ischemia.  . Diverticulitis   . 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 > 3 subsequent Echos show improved/normal EF with moderate to severe concentric LVH and diastolic dysfunction with LVOT/intracavitary gradient --> 06/2016: Severe  LVH.  Vigorous EF, 65-70%.?? Gr 1 DD. Mild AS.  [name] History of schizophrenia    However I am not sure about the validity of this. She is not on any medications.  . Hyperlipidemia   . Hypertension   . Hypertensive hypertrophic cardiomyopathy: NYHA class II:  Echo: Severe concentric LVH with LV OT gradient; essentially preserved EF with diastolic dysfunction 02/15/2013   Echo 06/2016: Severe Concentric LVH. Vigorous EF 65-70%. ~ Gr I DD.   . Mild aortic stenosis by prior echocardiography    Echo 06/2016: Mild AS (Mean Gradient 15 mmHg); has had prior Mod-Severe MR (not seen on current echo)  . PAD (peripheral artery disease) [facility]) March 2013   Lower extremity Dopplers: R. SFA 50-60%, R. PTA proximally occluded with distal reconstitution;; L. common iliac ~50%, L. SFA 50-70% stenosis, L. PTA < 50%  . Schizophrenia (HCC)   . Urinary tract infection     Past Surgical History:  Procedure Laterality Date  . BUNIONECTOMY    . carotid doppler  05/29/2011   left bulb/prox ICA moderate amtfibrous plaque with no evidence significant reduction.,right bulb /proximal ICA normal patency  . DOPPLER ECHOCARDIOGRAPHY  May 2014   EF 50-55%; severe concentric LVH; only grade 1 diastolic dysfunction. Mild aortic sclerosis - with LVOT /intracavitary gradient of roughly 20 mmHg mean. Mild to moderately dilated LA;; previously reported MR not seen  . DOPPLER ECHOCARDIOGRAPHY  02/02/2009   mod to  severe regurg mitral, EF 20 TO 25%, tricuspid severe regurg. ,pulmonic moderate regurg.  [name] DOPPLER ECHOCARDIOGRAPHY  06/2016   Severe LVH.  Vigorous EF of 65-70%.  No RWMA. ~Only grade 1 diastolic dysfunction.  Mild aortic stenosis (mean gradient 15 mmHg)  . lower extremity doppler  05/29/2011   right SFA 50% to 59% diameter reduction,right posterior tibal atreery occlusive disease,reconstituting distally, left common illiac<50%,left SFA 50 to70%,left post. tibial <50%  . NM MYOCAR PERF WALL MOTION  03/2009   Persantine; EF  51%-both anterior and inferolateral ischemia    Family History:  Family History  Problem Relation Age of Onset  . Hypertension Mother   . Breast cancer Neg Hx     Social History:  reports that she quit smoking about 15 years ago. Her smoking use included cigarettes. She has never used smokeless tobacco. She reports that she does not drink alcohol or use drugs.  Additional Social History:  Alcohol / Drug Use Pain Medications: See MAR Prescriptions: See MAR Over the Counter: See MAR History of alcohol / drug use?: No history of alcohol / drug abuse  CIWA: CIWA-Ar BP: (!) 195/87 Pulse Rate: 69 COWS:    Allergies: No Known Allergies  Home Medications:  (Not in a hospital admission)  OB/GYN Status:  Patient's last menstrual period was 05/10/2013.  General Assessment Data Location of Assessment: [facility] TTS Assessment: In system Is this a Tele or Face-to-Face Assessment?: Face-to-Face Is this an Initial Assessment or a Re-assessment for this encounter?: Initial Assessment Marital status: Other (comment)(UTA, pt refused to engage with Clinical research associate ) Is patient pregnant?: No Pregnancy Status: No Living Arrangements: Children(per chart review ) Can pt return to current living arrangement?: Yes Admission Status: Voluntary Is patient capable of signing voluntary admission?: Yes Referral Source: Self/Family/Friend Insurance type: [facility]     Crisis Care Plan Living Arrangements: Children(per chart review ) Name of Psychiatrist: [facility](per chart review, unable to confirm) Name of Therapist: UTA, pt refused to engage with Clinical research associate   Education Status Is patient currently in school?: No Is the patient employed, unemployed or receiving disability?: Receiving disability income  Risk to self with the past 6 months Suicidal Ideation: No Has patient been a risk to self within the past 6 months prior to admission? : No Suicidal Intent: No Has patient had any suicidal intent within the  past 6 months prior to admission? : No Is patient at risk for suicide?: No Suicidal Plan?: No Has patient had any suicidal plan within the past 6 months prior to admission? : No Access to Means: No What has been your use of drugs/alcohol within the last 12 months?: UTA, pt refused to engage with Clinical research associate. Labs negative Previous Attempts/Gestures: No Triggers for Past Attempts: None known Intentional Self Injurious Behavior: None Family Suicide History: No Recent stressful life event(s): Other (Comment)(delusions) Persecutory voices/beliefs?: No Depression: No Substance abuse history and/or treatment for substance abuse?: No Suicide prevention information given to non-admitted patients: Not applicable  Risk to Others within the past 6 months Homicidal Ideation: No Does patient have any lifetime risk of violence toward others beyond the six months prior to admission? : No Thoughts of Harm to Others: No Current Homicidal Intent: No Current Homicidal Plan: No Access to Homicidal Means: No History of harm to others?: No Assessment of Violence: None Noted Does patient have access to weapons?: (UTA, pt refused to engage with Clinical research associate ) Criminal Charges Pending?: (UTA, pt refused to engage with Clinical research associate ) Does patient have  a court date: (UTA, pt refused to engage with Clinical research associatewriter ) Is patient on probation?: Unknown  Psychosis Hallucinations: (UTA, pt refused to engage with Clinical research associatewriter ) Delusions: Unspecified  Mental Status Report Appearance/Hygiene: Disheveled Eye Contact: Fair Motor Activity: Freedom of movement Speech: Aggressive Level of Consciousness: Irritable Mood: Irritable Affect: Irritable Anxiety Level: None Thought Processes: Flight of Ideas Judgement: Impaired Orientation: Person, Place Obsessive Compulsive Thoughts/Behaviors: Severe  Cognitive Functioning Concentration: Fair Memory: Remote Impaired, Recent Impaired Is patient IDD: No Is patient DD?: No Insight:  Poor Impulse Control: Poor Appetite: Fair Have you had any weight changes? : No Change Sleep: Unable to Assess Vegetative Symptoms: None  ADLScreening [facility] Assessment Services) Patient's cognitive ability adequate to safely complete daily activities?: Yes Patient able to express need for assistance with ADLs?: Yes Independently performs ADLs?: Yes (appropriate for developmental age)  Prior Inpatient Therapy Prior Inpatient Therapy: Yes Prior Therapy Dates: 2019 and other admissions  Prior Therapy Facilty/Provider(s): [facility]  Reason for Treatment: SCHIZOPHRENIA   Prior Outpatient Therapy Prior Outpatient Therapy: Yes Prior Therapy Dates: Current(PER CHART ) Prior Therapy Facilty/Provider(s): [facility] Reason for Treatment: Schizophrenia Does patient have an ACCT team?: No Does patient have Intensive In-House Services?  : No Does patient have [facility] services? : Yes Does patient have P4CC services?: No  ADL Screening (condition at time of admission) Patient's cognitive ability adequate to safely complete daily activities?: Yes Is the patient deaf or have difficulty hearing?: No Does the patient have difficulty seeing, even when wearing glasses/contacts?: No Does the patient have difficulty concentrating, remembering, or making decisions?: Yes Patient able to express need for assistance with ADLs?: Yes Does the patient have difficulty dressing or bathing?: No Independently performs ADLs?: Yes (appropriate for developmental age) Does the patient have difficulty walking or climbing stairs?: No Weakness of Legs: None Weakness of Arms/Hands: None  Home Assistive Devices/Equipment Home Assistive Devices/Equipment: None    Abuse/Neglect Assessment (Assessment to be complete while patient is alone) Abuse/Neglect Assessment Can Be Completed: Unable to assess, patient is non-responsive or altered mental status     Advance Directives (For Healthcare) Does Patient Have a  Medical Advance Directive?: No Would patient like information on creating a medical advance directive?: No - Patient declined    Additional Information 1:1 In Past 12 Months?: No CIRT Risk: No Elopement Risk: No Does patient have medical clearance?: Yes     Disposition: Pt refusing to engage with this Clinical research associatewriter. Pt denies SI and demands to be d/c. TTS spoke with EDP Vanetta MuldersZackowski, Scott, MD who states he does not feel the need to IVC the pt as she denies SI, HI, and AVH. TTS provided pt with OPT resources. Pt to be d/c by EDP. Nira ConnJason Berry, NP contacted and in agreement with disposition.   Disposition Initial Assessment Completed for this Encounter: Yes Disposition of Patient: Discharge Patient refused recommended treatment: No  On Site Evaluation by:   Reviewed with Physician:    Karolee OhsAquicha R Jay Kempe 10/04/2017 10:07 PM

## 2017-10-04 NOTE — ED Notes (Signed)
Pt ambulated with 1 assist to bathroom. Pt had episode of incontinence in the stretcher.

## 2017-10-04 NOTE — Progress Notes (Signed)
Pt refusing to engage with this Clinical research associatewriter. Pt denies SI and demands to be d/c. TTS spoke with EDP Vanetta MuldersZackowski, Scott, MD who states he does not feel the need to IVC the pt as she denies SI, HI, and AVH. TTS provided pt with OPT resources. Pt to be d/c by EDP. Nira ConnJason Berry, NP contacted and in agreement with disposition.   Erin Riley, MSW, LCSW Therapeutic Triage Specialist  [phone]

## 2017-10-04 NOTE — MAU Note (Signed)
Pt reports vaginal itching and back pian that started today. Stated she is [redacted] weeks pregnant. Informed pt that she had a negative pregnancy test 10 days ago.

## 2017-10-04 NOTE — ED Notes (Signed)
TTS at bedside. 

## 2017-10-04 NOTE — ED Notes (Signed)
Patient [facility]. Security reports they will [facility]

## 2017-10-04 NOTE — MAU Note (Cosign Needed)
History  Chief Complaint:  Vaginal Itching and Back Pain  Erin Riley is a 67 y.o. [id number] female at Unknown presenting with complaints of abdominal pains which she calls labor pains. She states that she is pregnant and is dilating.  She says that she took her medication this morning but that she cannot remember what medicine it was and does not want to answer questions about it.   Obstetrical History: OB History    Gravida  3   Para  3   Term  3   Preterm      AB      Living  3     SAB      TAB      Ectopic      Multiple      Live Births  3           Past Medical History: Past Medical History:  Diagnosis Date  . Chronic back pain   . Chronic kidney disease (CKD), stage II (mild)    Class I-II  . Coronary artery disease 04/2009   50% stenosis in the perforator of LAD; catheterization was for an abnormal Myoview in January 2000 showing anterior and inferolateral ischemia.  . Diverticulitis   . 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 > 3 subsequent Echos show improved/normal EF with moderate to severe concentric LVH and diastolic dysfunction with LVOT/intracavitary gradient --> 06/2016: Severe LVH.  Vigorous EF, 65-70%.?? Gr 1 DD. Mild AS.  [name]. History of schizophrenia    However I am not sure about the validity of this. She is not on any medications.  . Hyperlipidemia   . Hypertension   . Hypertensive hypertrophic cardiomyopathy: NYHA class II:  Echo: Severe concentric LVH with LV OT gradient; essentially preserved EF with diastolic dysfunction 02/15/2013   Echo 06/2016: Severe Concentric LVH. Vigorous EF 65-70%. ~ Gr I DD.   . Mild aortic stenosis by prior echocardiography    Echo 06/2016: Mild AS (Mean Gradient 15 mmHg); has had prior Mod-Severe MR (not seen on current echo)  . PAD (peripheral artery disease) [facility](HCC) March 2013   Lower extremity Dopplers: R. SFA 50-60%,  R. PTA proximally occluded with distal reconstitution;; L. common iliac ~50%, L. SFA 50-70% stenosis, L. PTA < 50%  . Schizophrenia (HCC)   . Urinary tract infection     Past Surgical History: Past Surgical History:  Procedure Laterality Date  . BUNIONECTOMY    . carotid doppler  05/29/2011   left bulb/prox ICA moderate amtfibrous plaque with no evidence significant reduction.,right bulb /proximal ICA normal patency  . DOPPLER ECHOCARDIOGRAPHY  May 2014   EF 50-55%; severe concentric LVH; only grade 1 diastolic dysfunction. Mild aortic sclerosis - with LVOT /intracavitary gradient of roughly 20 mmHg mean. Mild to moderately dilated LA;; previously reported MR not seen  . DOPPLER ECHOCARDIOGRAPHY  02/02/2009   mod to severe regurg mitral, EF 20 TO 25%, tricuspid severe regurg. ,pulmonic moderate regurg.  [name]. DOPPLER ECHOCARDIOGRAPHY  06/2016   Severe LVH.  Vigorous EF of 65-70%.  No RWMA. ~Only grade 1 diastolic dysfunction.  Mild aortic stenosis (mean gradient 15 mmHg)  . lower extremity doppler  05/29/2011   right SFA 50% to 59% diameter reduction,right posterior tibal atreery occlusive disease,reconstituting distally, left common illiac<50%,left SFA 50 to70%,left post. tibial <50%  . NM MYOCAR PERF WALL MOTION  03/2009   Persantine; EF 51%-both  anterior and inferolateral ischemia    Social History: Social History   Socioeconomic History  . Marital status: Single    Spouse name: Not on file  . Number of children: 7  . Years of education: Not on file  . Highest education level: Not on file  Occupational History  . Not on file  Social Needs  . Financial resource strain: Not on file  . Food insecurity:    Worry: Not on file    Inability: Not on file  . Transportation needs:    Medical: Not on file    Non-medical: Not on file  Tobacco Use  . Smoking status: Former Smoker    Types: Cigarettes    Last attempt to quit: 05/14/2002    Years since quitting: 15.4  . Smokeless  tobacco: Never Used  Substance and Sexual Activity  . Alcohol use: No  . Drug use: No  . Sexual activity: Yes    Birth control/protection: Post-menopausal  Lifestyle  . Physical activity:    Days per week: Not on file    Minutes per session: Not on file  . Stress: Not on file  Relationships  . Social connections:    Talks on phone: Not on file    Gets together: Not on file    Attends religious service: Not on file    Active member of club or organization: Not on file    Attends meetings of clubs or organizations: Not on file    Relationship status: Not on file  Other Topics Concern  . Not on file  Social History Narrative   Now single mother of 2 with one grandchild. She quit smoking roughly 5 years ago and is not so since. She has also stopped drinking alcohol. She does try get routine exercise walking at least a mile 3-4 days a week.    She lives with her 39 year old mother. 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    Allergies: No Known Allergies   (Not in a hospital admission)  Review of Systems  Pertinent pos/neg as indicated in HPI  Physical Exam  Blood pressure (!) 217/90, pulse 70, temperature 98.2 F (36.8 C), resp. rate 15, height 5\' 7"  (1.702 m), weight 238 lb (108 kg), last menstrual period 05/10/2013, SpO2 100 %. General appearance: alert, cooperative and no distress Lungs: clear to auscultation bilaterally, normal effort Heart: regular rate and rhythm Abdomen: gravid, soft, non-tender Extremities: Noedema DTR's   MAU Course  Labs:  Results for orders placed or performed during the hospital encounter of 10/04/17 (from the past 24 hour(s))  Urinalysis, Routine w reflex microscopic     Status: Abnormal   Collection Time: 10/04/17 11:25 AM  Result Value Ref Range   Color, Urine YELLOW YELLOW   APPearance HAZY (A) CLEAR   Specific Gravity, Urine 1.016 1.005 - 1.030   pH 5.0 5.0 - 8.0   Glucose, UA NEGATIVE NEGATIVE mg/dL   Hgb urine dipstick NEGATIVE  NEGATIVE   Bilirubin Urine NEGATIVE NEGATIVE   Ketones, ur NEGATIVE NEGATIVE mg/dL   Protein, ur 30 (A) NEGATIVE mg/dL   Nitrite NEGATIVE NEGATIVE   Leukocytes, UA NEGATIVE NEGATIVE   WBC, UA 0-5 0 - 5 WBC/hpf   Bacteria, UA NONE SEEN NONE SEEN   Squamous Epithelial / LPF 6-10 0 - 5   Mucus PRESENT   Pregnancy, urine POC     Status: None   Collection Time: 10/04/17 11:29 AM  Result Value Ref Range   Preg Test, Ur NEGATIVE  NEGATIVE    Imaging:  Patient complaining of backache and abdominal pain as well as leg swelling. Legs appear normal with no signs of edema; Tylenol given and patient reports no relief. Patient's EKG abnormal and BP reading very concerning so given oral and PO hydralazine.  Dr. Adrian Blackwater updated on patient's status.     Assessment and Plan  A: Given patient's EKG reading and BP, will transfer to [facility] ED. Patient was transferred via [facility] without incident. Dr. Earnestine Mealing accepting physicians     Charlesetta Garibaldi Kyan Yurkovich 8/1/20193:28 PM

## 2017-10-04 NOTE — ED Notes (Signed)
Messaged pharmacy to send hydralazine

## 2017-10-04 NOTE — ED Triage Notes (Signed)
[facility] complaints of vaginal itching and back pain. Has been checked out over there and has had persistent HTN 230/100 en route.

## 2017-10-04 NOTE — ED Notes (Signed)
Called pharmacy to send hydralazine

## 2017-10-04 NOTE — Discharge Instructions (Addendum)
Also follow-up with behavioral health resources provided.  Continue take all your current medications.Follow-up with your primary care doctor regarding the blood pressure.

## 2017-10-05 ENCOUNTER — Encounter: Payer: Self-pay | Admitting: *Deleted

## 2017-10-26 ENCOUNTER — Encounter: Payer: Self-pay | Admitting: General Practice

## 2017-10-26 ENCOUNTER — Ambulatory Visit: Payer: Self-pay | Admitting: Obstetrics & Gynecology

## 2017-10-26 NOTE — Progress Notes (Unsigned)
Patient no showed for appt in office today, per Dr Dove patient can reschedule on her own 

## 2017-10-27 ENCOUNTER — Encounter (HOSPITAL_COMMUNITY): Payer: Self-pay | Admitting: *Deleted

## 2017-10-27 ENCOUNTER — Inpatient Hospital Stay (HOSPITAL_COMMUNITY)
Admission: AD | Admit: 2017-10-27 | Discharge: 2017-10-27 | Disposition: A | Discharge status: Home / Self Care | Payer: Medicare HMO | Attending: Obstetrics and Gynecology | Admitting: Obstetrics and Gynecology

## 2017-10-27 DIAGNOSIS — Z8719 Personal history of other diseases of the digestive system: Secondary | ICD-10-CM | POA: Diagnosis not present

## 2017-10-27 DIAGNOSIS — I2510 Atherosclerotic heart disease of native coronary artery without angina pectoris: Secondary | ICD-10-CM | POA: Diagnosis not present

## 2017-10-27 DIAGNOSIS — R1030 Lower abdominal pain, unspecified: Secondary | ICD-10-CM | POA: Insufficient documentation

## 2017-10-27 DIAGNOSIS — I129 Hypertensive chronic kidney disease with stage 1 through stage 4 chronic kidney disease, or unspecified chronic kidney disease: Secondary | ICD-10-CM | POA: Insufficient documentation

## 2017-10-27 DIAGNOSIS — Z79899 Other long term (current) drug therapy: Secondary | ICD-10-CM | POA: Diagnosis not present

## 2017-10-27 DIAGNOSIS — Z8249 Family history of ischemic heart disease and other diseases of the circulatory system: Secondary | ICD-10-CM | POA: Insufficient documentation

## 2017-10-27 DIAGNOSIS — E785 Hyperlipidemia, unspecified: Secondary | ICD-10-CM | POA: Diagnosis not present

## 2017-10-27 DIAGNOSIS — N182 Chronic kidney disease, stage 2 (mild): Secondary | ICD-10-CM | POA: Diagnosis not present

## 2017-10-27 DIAGNOSIS — R109 Unspecified abdominal pain: Secondary | ICD-10-CM | POA: Diagnosis present

## 2017-10-27 DIAGNOSIS — Z87891 Personal history of nicotine dependence: Secondary | ICD-10-CM | POA: Insufficient documentation

## 2017-10-27 DIAGNOSIS — I739 Peripheral vascular disease, unspecified: Secondary | ICD-10-CM | POA: Insufficient documentation

## 2017-10-27 DIAGNOSIS — F22 Delusional disorders: Secondary | ICD-10-CM | POA: Diagnosis not present

## 2017-10-27 DIAGNOSIS — Z87440 Personal history of urinary (tract) infections: Secondary | ICD-10-CM | POA: Insufficient documentation

## 2017-10-27 LAB — URINALYSIS, ROUTINE W REFLEX MICROSCOPIC
BILIRUBIN URINE: NEGATIVE
Glucose, UA: NEGATIVE mg/dL
Hgb urine dipstick: NEGATIVE
Ketones, ur: NEGATIVE mg/dL
NITRITE: NEGATIVE
PH: 6 (ref 5.0–8.0)
Protein, ur: 30 mg/dL — AB
SPECIFIC GRAVITY, URINE: 1.015 (ref 1.005–1.030)

## 2017-10-27 NOTE — MAU Note (Signed)
Urine in lab 

## 2017-10-27 NOTE — Discharge Instructions (Signed)
I am not changing any of your medications.  Please check with your doctor and take your medicines with you each time you visit your doctor. You can take Tylenol or ibuprofen by the package directions for abdominal pain if you want.

## 2017-10-27 NOTE — MAU Note (Signed)
Erin Riley is a 67 y.o. here in MAU reporting:  That she is pregnant and wants to have her pregnancy checked out. +lower mid abdominal cramping Pain score: 7/10 Vitals:   10/27/17 1031  BP: (!) 156/91  Pulse: (!) 109  Resp: 18  Temp: (!) 97.4 F (36.3 C)  SpO2: 96%      Lab orders placed from triage: ua

## 2017-10-27 NOTE — MAU Provider Note (Signed)
History     CSN: [id number]  Arrival date and time: 10/27/17 7829   First Provider Initiated Contact with Patient 10/27/17 1054      Chief Complaint  Patient presents with  . Abdominal Pain   HPI Erin Riley 67 y.o. Comes to MAU today to be checked for her pregnancy.  Had an appointment in the clinic yesterday but did not show for that appointment.  Reports she took her blood pressure medicine this morning.  She lives with her son, Erin Riley, and she drove herself to the hospital today.  She has a Psych history and the notes from 09-23-17 ([facility] to [facility]) and 10-04-17 (discharged from ER) reviewed.  She had an inpatient psych admission but is now home.  She does not report any doctor visits - she knows she sees a Dr. Barnett Abu at [facility] and Dr. Herbie Baltimore for cardiology - another appointment on 12-12-17.  She reports today similar complaints to previous visits - she is having some abdominal pian and wants her pregnancy checked.  Her pregnancy test on 10-04-17 was negative.  She reports she is 21 years old and her baby is "pooting" and "sucking it's thumb".  Of note, client has a history of schizophrenia.   OB History    Gravida  3   Para  3   Term  3   Preterm      AB      Living  3     SAB      TAB      Ectopic      Multiple      Live Births  3           Past Medical History:  Diagnosis Date  . Chronic back pain   . Chronic kidney disease (CKD), stage II (mild)    Class I-II  . Coronary artery disease 04/2009   50% stenosis in the perforator of LAD; catheterization was for an abnormal Myoview in January 2000 showing anterior and inferolateral ischemia.  . Diverticulitis   . 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 > 3 subsequent Echos show improved/normal EF with moderate to severe concentric LVH and diastolic dysfunction with LVOT/intracavitary gradient --> 06/2016: Severe LVH.   Vigorous EF, 65-70%.?? Gr 1 DD. Mild AS.  [name] History of schizophrenia    However I am not sure about the validity of this. She is not on any medications.  . Hyperlipidemia   . Hypertension   . Hypertensive hypertrophic cardiomyopathy: NYHA class II:  Echo: Severe concentric LVH with LV OT gradient; essentially preserved EF with diastolic dysfunction 02/15/2013   Echo 06/2016: Severe Concentric LVH. Vigorous EF 65-70%. ~ Gr I DD.   . Mild aortic stenosis by prior echocardiography    Echo 06/2016: Mild AS (Mean Gradient 15 mmHg); has had prior Mod-Severe MR (not seen on current echo)  . PAD (peripheral artery disease) [facility]) March 2013   Lower extremity Dopplers: R. SFA 50-60%, R. PTA proximally occluded with distal reconstitution;; L. common iliac ~50%, L. SFA 50-70% stenosis, L. PTA < 50%  . Schizophrenia (HCC)   . Urinary tract infection     Past Surgical History:  Procedure Laterality Date  . BUNIONECTOMY    . carotid doppler  05/29/2011   left bulb/prox ICA moderate amtfibrous plaque with no evidence significant reduction.,right bulb /proximal ICA normal patency  . DOPPLER ECHOCARDIOGRAPHY  May 2014  EF 50-55%; severe concentric LVH; only grade 1 diastolic dysfunction. Mild aortic sclerosis - with LVOT /intracavitary gradient of roughly 20 mmHg mean. Mild to moderately dilated LA;; previously reported MR not seen  . DOPPLER ECHOCARDIOGRAPHY  02/02/2009   mod to severe regurg mitral, EF 20 TO 25%, tricuspid severe regurg. ,pulmonic moderate regurg.  [name]. DOPPLER ECHOCARDIOGRAPHY  06/2016   Severe LVH.  Vigorous EF of 65-70%.  No RWMA. ~Only grade 1 diastolic dysfunction.  Mild aortic stenosis (mean gradient 15 mmHg)  . lower extremity doppler  05/29/2011   right SFA 50% to 59% diameter reduction,right posterior tibal atreery occlusive disease,reconstituting distally, left common illiac<50%,left SFA 50 to70%,left post. tibial <50%  . NM MYOCAR PERF WALL MOTION  03/2009   Persantine; EF  51%-both anterior and inferolateral ischemia    Family History  Problem Relation Age of Onset  . Hypertension Mother   . Breast cancer Neg Hx     Social History   Tobacco Use  . Smoking status: Former Smoker    Types: Cigarettes    Last attempt to quit: 05/14/2002    Years since quitting: 15.4  . Smokeless tobacco: Never Used  Substance Use Topics  . Alcohol use: No  . Drug use: No    Allergies: No Known Allergies  Medications Prior to Admission  Medication Sig Dispense Refill Last Dose  . carvedilol (COREG) 12.5 MG tablet Take 1 tablet (12.5 mg total) by mouth daily. 30 tablet 0 10/04/2017 at 1100am  . hydrALAZINE (APRESOLINE) 25 MG tablet Take 3 tablets (75 mg total) by mouth every 8 (eight) hours. For high blood pressure 12 tablet 0 10/04/2017 at Unknown time  . isosorbide mononitrate (IMDUR) 30 MG 24 hr tablet Take 3 tablets (90 mg total) by mouth daily. For chest pain (Patient not taking: Reported on 10/04/2017) 12 tablet 0 Not Taking at Unknown time  . isosorbide mononitrate (IMDUR) 60 MG 24 hr tablet TAKE 1 AND 1/2 TABLETS BY MOUTH EVERY DAY 45 tablet 0 10/04/2017 at Unknown time  . OLANZapine (ZYPREXA) 20 MG tablet Take 20 mg by mouth at bedtime.  0 10/03/2017 at Unknown time  . OLANZapine zydis (ZYPREXA) 5 MG disintegrating tablet Take 1 tablet (5 mg total) by mouth 2 (two) times daily. For mood control (Patient not taking: Reported on 09/22/2017) 60 tablet 0 Not Taking at Unknown time  . triamterene-hydrochlorothiazide (MAXZIDE-25) 37.5-25 MG tablet Take 1 tablet by mouth daily. For high blood pressure 30 tablet 0 10/04/2017 at Unknown time  . Vitamin D, Ergocalciferol, (DRISDOL) 50000 units CAPS capsule Take 50,000 Units by mouth every Monday.   10/04/2017 at Unknown time    Review of Systems  Constitutional: Negative for fever.  Gastrointestinal: Positive for abdominal pain. Negative for constipation, diarrhea, nausea and vomiting.  Genitourinary: Negative for dysuria, vaginal  bleeding and vaginal discharge.   Physical Exam   Blood pressure (!) 156/91, pulse (!) 109, temperature (!) 97.4 F (36.3 C), temperature source Oral, resp. rate 18, weight 111.2 kg, last menstrual period 05/10/2013, SpO2 96 %.  Physical Exam  Nursing note and vitals reviewed. Constitutional: She is oriented to person, place, and time. She appears well-developed and well-nourished.  Fully dressed and sitting in chair in exam room.  HENT:  Head: Normocephalic.  Eyes: EOM are normal.  Neck: Neck supple.  GI: Soft. Bowel sounds are normal. There is no rebound and no guarding.  Reports pain from pubic area to umbilicus.  No masses palpated.    Musculoskeletal:  Normal range of motion.  Moves from the chair to the bed with no difficulty.    Neurological: She is alert and oriented to person, place, and time.  Skin: Skin is warm and dry.  Psychiatric: She has a normal mood and affect.  Thinks she is pregnant.  Is very calm and talkative but persistent in this thought.    MAU Course  Procedures Results for orders placed or performed during the hospital encounter of 10/27/17 (from the past 24 hour(s))  Urinalysis, Routine w reflex microscopic     Status: Abnormal   Collection Time: 10/27/17 10:21 AM  Result Value Ref Range   Color, Urine YELLOW YELLOW   APPearance HAZY (A) CLEAR   Specific Gravity, Urine 1.015 1.005 - 1.030   pH 6.0 5.0 - 8.0   Glucose, UA NEGATIVE NEGATIVE mg/dL   Hgb urine dipstick NEGATIVE NEGATIVE   Bilirubin Urine NEGATIVE NEGATIVE   Ketones, ur NEGATIVE NEGATIVE mg/dL   Protein, ur 30 (A) NEGATIVE mg/dL   Nitrite NEGATIVE NEGATIVE   Leukocytes, UA MODERATE (A) NEGATIVE   RBC / HPF 0-5 0 - 5 RBC/hpf   WBC, UA 11-20 0 - 5 WBC/hpf   Bacteria, UA RARE (A) NONE SEEN   Squamous Epithelial / LPF 11-20 0 - 5   Mucus PRESENT     MDM With several recent negative urine pregnancy tests and her age of 53  there is not a need to repeat the pregnancy test today, but  will check for any Urinary tract infection as she does report lower midline abdominal pain.  Reports a soft BM today and is not having severe pain.    Urine results reviewed - No infection found today - contaminated specien due to many squamous cells.  Discussed with her the importance of seeing her regular doctor and reviewed the missed clinic appointment yesterday.  Client has persistent delusions of pregnancy, but is calm, and not reporting any ideas of harm to herself.  She drove to the hospital so will discharge her to home today.  Client asking to go home - urinalysis not yet ready, but will discharge client.  Assessment and Plan  Persistent delusions of pregnancy  Plan Call the clinic to follow up if desired - is up to date on Pap Done 12-25-16 Needs to see her PCP and cardiologist as scheduled. Would recommend ongoing outpatient behavioral health visits but I doubt client is agreeable to this - she does not report seeing any mental health provider since her involuntary commitment earlier this year.  Mikolaj Woolstenhulme L Anastacia Reinecke 10/27/2017, 10:54 AM

## 2017-11-13 ENCOUNTER — Encounter: Payer: Self-pay | Admitting: Physician Assistant

## 2017-11-13 ENCOUNTER — Other Ambulatory Visit: Payer: Self-pay

## 2017-11-13 ENCOUNTER — Ambulatory Visit (INDEPENDENT_AMBULATORY_CARE_PROVIDER_SITE_OTHER): Payer: Medicare HMO | Admitting: Physician Assistant

## 2017-11-13 VITALS — BP 160/80 | HR 99 | Temp 98.4°F | Resp 20 | Ht 68.5 in | Wt 241.6 lb

## 2017-11-13 DIAGNOSIS — M545 Low back pain: Secondary | ICD-10-CM

## 2017-11-13 DIAGNOSIS — I10 Essential (primary) hypertension: Secondary | ICD-10-CM

## 2017-11-13 DIAGNOSIS — N183 Chronic kidney disease, stage 3 unspecified: Secondary | ICD-10-CM

## 2017-11-13 DIAGNOSIS — G8929 Other chronic pain: Secondary | ICD-10-CM | POA: Diagnosis not present

## 2017-11-13 MED ORDER — DICLOFENAC SODIUM 1 % TD GEL: 2.0000 g | Freq: Four times a day (QID) | TRANSDERMAL | 0 refills | Status: DC

## 2017-11-13 NOTE — Patient Instructions (Addendum)
Please follow up with Dr. Creta Levin or Dr. Leretha Pol on a Saturday for a complete physical exam. Bring your son and all of your medications to this visit.   I recommend resting today. However, tomorrow I would begin walking and moving around as much as tolerated. Begin stretching in a couple of days. The worse thing you can do for low back pain is lie in bed all day or sit down all day. Use medications as needed.   I recommend using topical antiinflammatory gel to the affected area up to 3 times a day. I also recommend using a heating pad at least 4-5 x a day for 20 minutes at a time.   Please perform exercises below. Stretches are to be performed for 2 sets, holding 10-15 seconds each. Recommended to perform this rehab twice daily within pain tolerance for 2 weeks.   FLEXION RANGE OF MOTION AND STRETCHING EXERCISES: STRETCH - Flexion, Single Knee to Chest   Lie on a firm bed or floor with both legs extended in front of you.  Keeping one leg in contact with the floor, bring your opposite knee to your chest. Hold your leg in place by either grabbing behind your thigh or at your knee.  Pull until you feel a gentle stretch in your lower back.   Slowly release your grasp and repeat the exercise with the opposite side.  STRETCH - Flexion, Double Knee to Chest   Lie on a firm bed or floor with both legs extended in front of you.  Keeping one leg in contact with the floor, bring your opposite knee to your chest.  Tense your stomach muscles to support your back and then lift your other knee to your chest. Hold your legs in place by either grabbing behind your thighs or at your knees.  Pull both knees toward your chest until you feel a gentle stretch in your lower back.   Tense your stomach muscles and slowly return one leg at a time to the floor.  STRETCH - Low Trunk Rotation  Lie on a firm bed or floor. Keeping your legs in front of you, bend your knees so they are both pointed toward the  ceiling and your feet are flat on the floor.  Extend your arms out to the side. This will stabilize your upper body by keeping your shoulders in contact with the floor.  Gently and slowly drop both knees together to one side until you feel a gentle stretch in your lower back.   Tense your stomach muscles to support your lower back as you bring your knees back to the starting position. Repeat the exercise to the other side.   EXTENSION RANGE OF MOTION AND FLEXIBILITY EXERCISES: STRETCH - Extension, Prone on Elbows   Lie on your stomach on the floor, a bed will be too soft. Place your palms about shoulder width apart and at the height of your head.  Place your elbows under your shoulders. If this is too painful, stack pillows under your chest.  Allow your body to relax so that your hips drop lower and make contact more completely with the floor.  Slowly return to lying flat on the floor.  RANGE OF MOTION - Extension, Prone Press Ups  Lie on your stomach on the floor, a bed will be too soft. Place your palms about shoulder width apart and at the height of your head.  Keeping your back as relaxed as possible, slowly straighten your elbows while keeping your  hips on the floor. You may adjust the placement of your hands to maximize your comfort. As you gain motion, your hands will come more underneath your shoulders.  Slowly return to lying flat on the floor.  RANGE OF MOTION- Quadruped, Neutral Spine   Assume a hands and knees position on a firm surface. Keep your hands under your shoulders and your knees under your hips. You may place padding under your knees for comfort.  Drop your head and point your tail bone toward the ground below you. This will round out your lower back like an angry cat.    Slowly lift your head and release your tail bone so that your back sags into a large arch, like an old horse.  Repeat this until you feel limber in your lower back.  Now, find your "sweet  spot." This will be the most comfortable position somewhere between the two previous positions. This is your neutral spine. Once you have found this position, tense your stomach muscles to support your lower back.  STRENGTHENING EXERCISES - Low Back Strain These exercises may help you when beginning to rehabilitate your injury. These exercises should be done near your "sweet spot." This is the neutral, low-back arch, somewhere between fully rounded and fully arched, that is your least painful position. When performed in this safe range of motion, these exercises can be used for people who have either a flexion or extension based injury. These exercises may resolve your symptoms with or without further involvement from your physician, physical therapist or athletic trainer. While completing these exercises, remember:   Muscles can gain both the endurance and the strength needed for everyday activities through controlled exercises.  Complete these exercises as instructed by your physician, physical therapist or athletic trainer. Increase the resistance and repetitions only as guided.  You may experience muscle soreness or fatigue, but the pain or discomfort you are trying to eliminate should never worsen during these exercises. If this pain does worsen, stop and make certain you are following the directions exactly. If the pain is still present after adjustments, discontinue the exercise until you can discuss the trouble with your caregiver.  STRENGTHENING - Deep Abdominals, Pelvic Tilt  Lie on a firm bed or floor. Keeping your legs in front of you, bend your knees so they are both pointed toward the ceiling and your feet are flat on the floor.  Tense your lower abdominal muscles to press your lower back into the floor. This motion will rotate your pelvis so that your tail bone is scooping upwards rather than pointing at your feet or into the floor.  STRENGTHENING - Abdominals, Crunches   Lie on a  firm bed or floor. Keeping your legs in front of you, bend your knees so they are both pointed toward the ceiling and your feet are flat on the floor. Cross your arms over your chest.  Slightly tip your chin down without bending your neck.  Tense your abdominals and slowly lift your trunk high enough to just clear your shoulder blades. Lifting higher can put excessive stress on the lower back and does not further strengthen your abdominal muscles.  Control your return to the starting position.  STRENGTHENING - Quadruped, Opposite UE/LE Lift   Assume a hands and knees position on a firm surface. Keep your hands under your shoulders and your knees under your hips. You may place padding under your knees for comfort.  Find your neutral spine and gently tense your abdominal  muscles so that you can maintain this position. Your shoulders and hips should form a rectangle that is parallel with the floor and is not twisted.  Keeping your trunk steady, lift your right hand no higher than your shoulder and then your left leg no higher than your hip. Make sure you are not holding your breath.   Continuing to keep your abdominal muscles tense and your back steady, slowly return to your starting position. Repeat with the opposite arm and leg.  STRENGTHENING - Lower Abdominals, Double Knee Lift  Lie on a firm bed or floor. Keeping your legs in front of you, bend your knees so they are both pointed toward the ceiling and your feet are flat on the floor.  Tense your abdominal muscles to brace your lower back and slowly lift both of your knees until they come over your hips. Be certain not to hold your breath.  POSTURE AND BODY MECHANICS CONSIDERATIONS - Low Back Strain Keeping correct posture when sitting, standing or completing your activities will reduce the stress put on different body tissues, allowing injured tissues a chance to heal and limiting painful experiences. The following are general guidelines  for improved posture. Your physician or physical therapist will provide you with any instructions specific to your needs. While reading these guidelines, remember:  The exercises prescribed by your provider will help you have the flexibility and strength to maintain correct postures.  The correct posture provides the best environment for your joints to work. All of your joints have less wear and tear when properly supported by a spine with good posture. This means you will experience a healthier, less painful body.  Correct posture must be practiced with all of your activities, especially prolonged sitting and standing. Correct posture is as important when doing repetitive low-stress activities (typing) as it is when doing a single heavy-load activity (lifting). RESTING POSITIONS Consider which positions are most painful for you when choosing a resting position. If you have pain with flexion-based activities (sitting, bending, stooping, squatting), choose a position that allows you to rest in a less flexed posture. You would want to avoid curling into a fetal position on your side. If your pain worsens with extension-based activities (prolonged standing, working overhead), avoid resting in an extended position such as sleeping on your stomach. Most people will find more comfort when they rest with their spine in a more neutral position, neither too rounded nor too arched. Lying on a non-sagging bed on your side with a pillow between your knees, or on your back with a pillow under your knees will often provide some relief. Keep in mind, being in any one position for a prolonged period of time, no matter how correct your posture, can still lead to stiffness. PROPER SITTING POSTURE In order to minimize stress and discomfort on your spine, you must sit with correct posture. Sitting with good posture should be effortless for a healthy body. Returning to good posture is a gradual process. Many people can work  toward this most comfortably by using various supports until they have the flexibility and strength to maintain this posture on their own. When sitting with proper posture, your ears will fall over your shoulders and your shoulders will fall over your hips. You should use the back of the chair to support your upper back. Your lower back will be in a neutral position, just slightly arched. You may place a small pillow or folded towel at the base of your lower back  for support.  When working at a desk, create an environment that supports good, upright posture. Without extra support, muscles tire, which leads to excessive strain on joints and other tissues. Keep these recommendations in mind: CHAIR:  A chair should be able to slide under your desk when your back makes contact with the back of the chair. This allows you to work closely.  The chair's height should allow your eyes to be level with the upper part of your monitor and your hands to be slightly lower than your elbows. BODY POSITION  Your feet should make contact with the floor. If this is not possible, use a foot rest.  Keep your ears over your shoulders. This will reduce stress on your neck and lower back. INCORRECT SITTING POSTURES  If you are feeling tired and unable to assume a healthy sitting posture, do not slouch or slump. This puts excessive strain on your back tissues, causing more damage and pain. Healthier options include:  Using more support, like a lumbar pillow.  Switching tasks to something that requires you to be upright or walking.  Talking a brief walk.  Lying down to rest in a neutral-spine position. PROLONGED STANDING WHILE SLIGHTLY LEANING FORWARD  When completing a task that requires you to lean forward while standing in one place for a long time, place either foot up on a stationary 2-4 inch high object to help maintain the best posture. When both feet are on the ground, the lower back tends to lose its slight  inward curve. If this curve flattens (or becomes too large), then the back and your other joints will experience too much stress, tire more quickly, and can cause pain. CORRECT STANDING POSTURES Proper standing posture should be assumed with all daily activities, even if they only take a few moments, like when brushing your teeth. As in sitting, your ears should fall over your shoulders and your shoulders should fall over your hips. You should keep a slight tension in your abdominal muscles to brace your spine. Your tailbone should point down to the ground, not behind your body, resulting in an over-extended swayback posture.  INCORRECT STANDING POSTURES  Common incorrect standing postures include a forward head, locked knees and/or an excessive swayback. WALKING Walk with an upright posture. Your ears, shoulders and hips should all line-up. PROLONGED ACTIVITY IN A FLEXED POSITION When completing a task that requires you to bend forward at your waist or lean over a low surface, try to find a way to stabilize 3 out of 4 of your limbs. You can place a hand or elbow on your thigh or rest a knee on the surface you are reaching across. This will provide you more stability so that your muscles do not fatigue as quickly. By keeping your knees relaxed, or slightly bent, you will also reduce stress across your lower back. CORRECT LIFTING TECHNIQUES DO :   Assume a wide stance. This will provide you more stability and the opportunity to get as close as possible to the object which you are lifting.  Tense your abdominals to brace your spine. Bend at the knees and hips. Keeping your back locked in a neutral-spine position, lift using your leg muscles. Lift with your legs, keeping your back straight.  Test the weight of unknown objects before attempting to lift them.  Try to keep your elbows locked down at your sides in order get the best strength from your shoulders when carrying an object.  Always ask for  help when lifting heavy or awkward objects. INCORRECT LIFTING TECHNIQUES DO NOT:   Lock your knees when lifting, even if it is a small object.  Bend and twist. Pivot at your feet or move your feet when needing to change directions.  Assume that you can safely pick up even a paper clip without proper posture.       IF you received an x-ray today, you will receive an invoice from [facility]. Please contact [facility] at [phone] with questions or concerns regarding your invoice.   IF you received labwork today, you will receive an invoice from [facility]. Please contact [facility] at [phone] with questions or concerns regarding your invoice.   Our billing staff will not be able to assist you with questions regarding bills from these companies.  You will be contacted with the lab results as soon as they are available. The fastest way to get your results is to activate your My Chart account. Instructions are located on the last page of this paperwork. If you have not heard from [place] regarding the results in 2 weeks, please contact this office.
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
## 2017-11-28 ENCOUNTER — Telehealth: Payer: Self-pay | Admitting: Family Medicine

## 2017-11-28 NOTE — Telephone Encounter (Signed)
Called pt to advise of the two different appointments that she has for CPE. Patient has one CPE scheduled for 12/07/17 with McVey at 10:40 and the second one is with Dr. Leretha Pol on 01/05/18 at 8:40.  Patient has a VM that has not been set up and no MyChart message is able to be sent. If patient calls in, please ask which appointment she would like to keep.  Thank you!

## 2017-12-07 ENCOUNTER — Encounter: Payer: Medicare HMO | Admitting: Physician Assistant

## 2017-12-12 ENCOUNTER — Encounter: Payer: Self-pay | Admitting: Cardiology

## 2017-12-12 ENCOUNTER — Ambulatory Visit (INDEPENDENT_AMBULATORY_CARE_PROVIDER_SITE_OTHER): Payer: Medicare HMO | Admitting: Cardiology

## 2017-12-12 VITALS — BP 164/104 | HR 76 | Ht 68.5 in | Wt 244.0 lb

## 2017-12-12 DIAGNOSIS — I119 Hypertensive heart disease without heart failure: Secondary | ICD-10-CM | POA: Diagnosis not present

## 2017-12-12 DIAGNOSIS — I5189 Other ill-defined heart diseases: Secondary | ICD-10-CM

## 2017-12-12 DIAGNOSIS — E785 Hyperlipidemia, unspecified: Secondary | ICD-10-CM

## 2017-12-12 DIAGNOSIS — I771 Stricture of artery: Secondary | ICD-10-CM | POA: Insufficient documentation

## 2017-12-12 DIAGNOSIS — I358 Other nonrheumatic aortic valve disorders: Secondary | ICD-10-CM | POA: Diagnosis not present

## 2017-12-12 DIAGNOSIS — I10 Essential (primary) hypertension: Secondary | ICD-10-CM | POA: Diagnosis not present

## 2017-12-12 DIAGNOSIS — I519 Heart disease, unspecified: Secondary | ICD-10-CM

## 2017-12-12 DIAGNOSIS — I422 Other hypertrophic cardiomyopathy: Secondary | ICD-10-CM

## 2017-12-12 MED ORDER — ISOSORBIDE MONONITRATE ER 60 MG PO TB24: 90.0000 mg | ORAL_TABLET | Freq: Every day | ORAL | 11 refills | Status: DC

## 2017-12-12 MED ORDER — ATORVASTATIN CALCIUM 40 MG PO TABS: 40.0000 mg | ORAL_TABLET | Freq: Every day | ORAL | 3 refills | Status: DC

## 2017-12-12 MED ORDER — HYDRALAZINE HCL 100 MG PO TABS: 100.0000 mg | ORAL_TABLET | Freq: Three times a day (TID) | ORAL | 3 refills | Status: DC

## 2017-12-12 MED ORDER — CARVEDILOL 25 MG PO TABS: 25.0000 mg | ORAL_TABLET | Freq: Every day | ORAL | 3 refills | Status: DC

## 2017-12-12 NOTE — Patient Instructions (Signed)
Medication Instructions:  Increase- Coreg 25 mg two times a day       Hydralazine 100 mg three times a day  If you need a refill on your cardiac medications before your next appointment, please call your pharmacy.   Lab work: None   Testing/Procedures: Your physician has requested that you have a carotid duplex. This test is an ultrasound of the carotid arteries in your neck. It looks at blood flow through these arteries that supply the brain with blood. Allow one hour for this exam. There are no restrictions or special instructions. [address]  Follow-Up: At [facility], you and your health needs are our priority.  As part of our continuing mission to provide you with exceptional heart care, we have created designated Provider Care Teams.  These Care Teams include your primary Cardiologist (physician) and Advanced Practice Providers (APPs -  Physician Assistants and Nurse Practitioners) who all work together to provide you with the care you need, when you need it. You will need a follow up appointment in 6 months.  Please call our office 2 months in advance to schedule this appointment.  You may see Bryan Lemma, MD or one of the following Advanced Practice Providers on your designated Care Team:   Theodore Demark, PA-C . Joni Reining, DNP, ANP  Any Other Special Instructions Will Be Listed Below (If Applicable).

## 2017-12-12 NOTE — Progress Notes (Signed)
PCP: Magdalene River, PA-C  Clinic Note: Chief Complaint  Patient presents with  . Follow-up    Hypertension    HPI: Erin Riley is a 67 y.o. 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 She is a very poor historian.  She does have schizophrenia and really has no ability to help me with knowing what medications she is on how she is taking them.  Hypertensive Heart Disease  Moderate CAD, hypertension, and PAD.   She had an abnormal Myoview in 2011 followed by cardiac catheterization revealing nonobstructive disease.   ACHAIA GARLOCK was last seen on May 09, 2016 (very difficult visit, but she seem to be doing fine).  Recheck 2D echocardiogram to reassess filling pressures and valve.  Recent Hospitalizations: She has had several emergency room evaluations.    September 22, 2017 disorder issues.  August 1 ER visit with hypertension --not sure if she was truly taking her medicines or not.  August 24 -came in to be checked for pregnancy --> schizophrenia  Studies Personally Reviewed - (if available, images/films reviewed: From Epic Chart or Care Everywhere)  Nothing since echocardiogram April 2018: Severe LVH.  Vigorous EF of 65-70%.  No RWMA. ~Only grade 1 diastolic dysfunction.  Mild aortic stenosis (mean gradient 15 mmHg)  Interval History: Erin Riley returns today stating that she is taking all of her medicines, but has been out of her hydralazine for couple days.  Otherwise really she is totally symptomatically cardiac standpoint.  No heart failure symptoms of PND, orthopnea with only minimal edema.  She still is not very active any significant occasional palpitations but no rapid irregular heartbeats.  (Very poor historian)  No syncope/near syncope or TIA shows amaurosis fugax.  No real bad headaches or blurred vision.  No syncope/near syncope or TIA shows amaurosis fugax.  No chest pain or  pressure with rest or exertion. No claudication.  ROS: A comprehensive was performed. Review of Systems  Constitutional: Negative for malaise/fatigue and weight loss.  HENT: Positive for congestion and sinus pain.        Getting over a cold  Respiratory:       Getting over cold.  Some residual cough  Gastrointestinal: Positive for abdominal pain (Frequent episodes of abdominal pain), constipation and nausea. Negative for blood in stool and melena.  Musculoskeletal: Positive for joint pain.  Psychiatric/Behavioral: Negative for hallucinations and suicidal ideas. The patient is nervous/anxious.        Very poor historian.  Although she seems coherent, she does not really answer questions appropriately.  All other systems reviewed and are negative.  I have reviewed and (if needed) personally updated the patient's problem list, medications, allergies, past medical and surgical history, social and family history.   Past Medical History:  Diagnosis Date  . Chronic back pain   . Chronic kidney disease (CKD), stage II (mild)    Class I-II  . Coronary artery disease 04/2009   50% stenosis in the perforator of LAD; catheterization was for an abnormal Myoview in January 2000 showing anterior and inferolateral ischemia.  . Diverticulitis   . 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 > 3 subsequent Echos show improved/normal EF with moderate to severe concentric LVH and diastolic dysfunction with LVOT/intracavitary gradient --> 06/2016: Severe LVH.  Vigorous EF, 65-70%.?? Gr 1 DD. Mild AS.  Erin Riley  History of schizophrenia    However I am not sure about the validity of this. She is not on any medications.  . Hyperlipidemia   . Hypertension   . Hypertensive hypertrophic cardiomyopathy: NYHA class II:  Echo: Severe concentric LVH with LV OT gradient; essentially preserved EF with diastolic dysfunction 02/15/2013     Echo 06/2016: Severe Concentric LVH. Vigorous EF 65-70%. ~ Gr I DD.   . Mild aortic stenosis by prior echocardiography    Echo 06/2016: Mild AS (Mean Gradient 15 mmHg); has had prior Mod-Severe MR (not seen on current echo)  . PAD (peripheral artery disease) [facility]) March 2013   Lower extremity Dopplers: R. SFA 50-60%, R. PTA proximally occluded with distal reconstitution;; L. common iliac ~50%, L. SFA 50-70% stenosis, L. PTA < 50%  . Schizophrenia (HCC)   . Urinary tract infection     Past Surgical History:  Procedure Laterality Date  . BUNIONECTOMY    . carotid doppler  05/29/2011   left bulb/prox ICA moderate amtfibrous plaque with no evidence significant reduction.,right bulb /proximal ICA normal patency  . lower extremity doppler  05/29/2011   right SFA 50% to 59% diameter reduction,right posterior tibal atreery occlusive disease,reconstituting distally, left common illiac<50%,left SFA 50 to70%,left post. tibial <50%  . NM MYOCAR PERF WALL MOTION  03/2009   Persantine; EF 51%-both anterior and inferolateral ischemia  . TRANSTHORACIC ECHOCARDIOGRAM  06/2016   Severe LVH.  Vigorous EF of 65-70%.  No RWMA. ~Only grade 1 diastolic dysfunction.  Mild aortic stenosis (mean gradient 15 mmHg)  . TRANSTHORACIC ECHOCARDIOGRAM  07/2012   EF 50-55%; severe concentric LVH; only grade 1 diastolic dysfunction. Mild aortic sclerosis - with LVOT /intracavitary gradient of roughly 20 mmHg mean. Mild to moderately dilated LA;; previously reported MR not seen    Current Meds  Medication Sig  . atorvastatin (LIPITOR) 40 MG tablet Take 1 tablet (40 mg total) by mouth daily.  . carvedilol (COREG) 25 MG tablet Take 1 tablet (25 mg total) by mouth daily.  . diclofenac sodium (VOLTAREN) 1 % GEL Apply 2 g topically 4 (four) times daily.  . hydrALAZINE (APRESOLINE) 100 MG tablet Take 1 tablet (100 mg total) by mouth 3 (three) times daily. For high blood pressure  . isosorbide mononitrate (IMDUR) 60 MG 24 hr  tablet Take 1.5 tablets (90 mg total) by mouth daily.  Erin Riley OLANZapine zydis (ZYPREXA) 20 MG disintegrating tablet   . triamterene-hydrochlorothiazide (MAXZIDE-25) 37.5-25 MG tablet Take 1 tablet by mouth daily. For high blood pressure  . Vitamin D, Ergocalciferol, (DRISDOL) 50000 units CAPS capsule Take 50,000 Units by mouth every Monday.  . [DISCONTINUED] atorvastatin (LIPITOR) 40 MG tablet Take 40 mg by mouth daily.  . [DISCONTINUED] carvedilol (COREG) 12.5 MG tablet Take 1 tablet (12.5 mg total) by mouth daily.  . [DISCONTINUED] hydrALAZINE (APRESOLINE) 100 MG tablet Take 1 tablet (100 mg total) by mouth 3 (three) times daily. For high blood pressure  . [DISCONTINUED] hydrALAZINE (APRESOLINE) 25 MG tablet Take 3 tablets (75 mg total) by mouth every 8 (eight) hours. For high blood pressure  . [DISCONTINUED] isosorbide mononitrate (IMDUR) 30 MG 24 hr tablet Take 3 tablets (90 mg total) by mouth daily. For chest pain  . [DISCONTINUED] isosorbide mononitrate (IMDUR) 60 MG 24 hr tablet TAKE 1 AND 1/2 TABLETS BY MOUTH EVERY DAY    No Known Allergies  Social History   Tobacco Use  . Smoking status: Former Smoker    Types: Cigarettes    Last  attempt to quit: 05/14/2002    Years since quitting: 15.5  . Smokeless tobacco: Never Used  Substance Use Topics  . Alcohol use: No  . Drug use: No   Social History   Social History Narrative   Now single mother of 2 with one grandchild. She quit smoking roughly 5 years ago and is not so since. She has also stopped drinking alcohol. She does try get routine exercise walking at least a mile 3-4 days a week.    She lives with her 16 year old mother. 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    family history includes Hypertension in her mother.  Wt Readings from Last 3 Encounters:  12/12/17 244 lb (110.7 kg)  11/13/17 241 lb 9.6 oz (109.6 kg)  10/27/17 245 lb 1.9 oz (111.2 kg)    PHYSICAL EXAM BP (!) 164/104   Pulse 76   Ht 5' 8.5" (1.74 m)   Wt 244  lb (110.7 kg)   LMP 05/10/2013   SpO2 96%   BMI 36.56 kg/m  --CMA noted different pulse pressure and blood pressures on both arms. Physical Exam  Constitutional: She appears well-developed and well-nourished.  Actually well-groomed.  Does not appear disheveled  HENT:  Head: Normocephalic and atraumatic.  Neck: No hepatojugular reflux and no JVD present. Carotid bruit is not present (Referred SEM, but no bruit).  Cardiovascular: Normal rate, regular rhythm, S1 normal, S2 normal, intact distal pulses and normal pulses.  No extrasystoles are present. PMI is not displaced (Unable to assess). Exam reveals no gallop and no friction rub.  Murmur heard. High-pitched harsh early systolic murmur is present with a grade of 2/6 at the upper right sternal border radiating to the neck.  Medium-pitched blowing holosystolic murmur of grade 1/6 is also present at the upper left sternal border and apex radiating to the axilla. With the murmur, I cannot tell if there is subclavian stenosis or not.  There may be a bruit.  Pulmonary/Chest: Effort normal and breath sounds normal. No respiratory distress. She has no wheezes. She has no rales. She exhibits no tenderness.  Abdominal: Soft. Bowel sounds are normal. She exhibits no distension. There is no tenderness. There is no rebound.  Skin: Skin is warm and dry.  Psychiatric:  Still blunted/Odd affect; difficult to redirect with questions.  Insists that she has been pregnant.  Nursing note and vitals reviewed.   Adult ECG Report Not checked  Other studies Reviewed:  Additional studies/ records that were reviewed today include:  Recent Labs:  Not checked  Lab Results  Component Value Date   CHOL 123 08/31/2017   HDL 40 08/31/2017   LDLCALC 59 08/31/2017   TRIG 120 08/31/2017   CHOLHDL 3.1 08/31/2017    ASSESSMENT / PLAN: Problem List Items Addressed This Visit    Essential hypertension (Chronic)    Previously was well controlled.  I am not sure what  is happening now. Plan: Increase carvedilol to 25 mg twice daily and hydralazine to 100 mg 3 times daily. Continue Maxide and Imdur at current doses. Not sure what happened to spironolactone.  Presumably was stopped in the hospital for some reason.  Apparently somewhere along the line carvedilol was also stopped.  She is now back on it.      Relevant Medications   atorvastatin (LIPITOR) 40 MG tablet   isosorbide mononitrate (IMDUR) 60 MG 24 hr tablet   carvedilol (COREG) 25 MG tablet   hydrALAZINE (APRESOLINE) 100 MG tablet   Heart murmur, aortic  Mild aortic stenosis on echo back in April 2018.  Will need to reassess after 92-month follow-up.      Hyperlipidemia with target LDL less than 100 (Chronic)    Now she appears to be back on atorvastatin.  Most recent lipid panel shows total cholesterol 123, triglycerides 120, HDL 40 and LDL 59.  Pretty well controlled.  No change.      Relevant Medications   atorvastatin (LIPITOR) 40 MG tablet   isosorbide mononitrate (IMDUR) 60 MG 24 hr tablet   carvedilol (COREG) 25 MG tablet   hydrALAZINE (APRESOLINE) 100 MG tablet   Hypertensive hypertrophic cardiomyopathy: NYHA class II:   (Chronic)    Confirmed by echo.  Continue aggressive blood pressure management. Increase hydralazine and carvedilol as noted.      Relevant Medications   atorvastatin (LIPITOR) 40 MG tablet   isosorbide mononitrate (IMDUR) 60 MG 24 hr tablet   carvedilol (COREG) 25 MG tablet   hydrALAZINE (APRESOLINE) 100 MG tablet   Left ventricular diastolic dysfunction, NYHA class 1 (Chronic)    Basically hypertensive heart disease but with no active heart failure symptoms.  Continue to titrate up antihypertensives as noted.  No requirement for diuretic besides Maxide now.      Relevant Medications   atorvastatin (LIPITOR) 40 MG tablet   isosorbide mononitrate (IMDUR) 60 MG 24 hr tablet   carvedilol (COREG) 25 MG tablet   hydrALAZINE (APRESOLINE) 100 MG tablet    Subclavian artery stenosis (HCC) - Primary    Weakening pulse left arm noted difference in blood pressure as well.  Will check carotid Dopplers focus, subclavian arteries as well.      Relevant Medications   atorvastatin (LIPITOR) 40 MG tablet   isosorbide mononitrate (IMDUR) 60 MG 24 hr tablet   carvedilol (COREG) 25 MG tablet   hydrALAZINE (APRESOLINE) 100 MG tablet   Other Relevant Orders   VAS US CAROTID      Current medicines are reviewed at length with the patient today. (+/- concerns) n/a The following changes have been made: n/a  Patient Instructions  Medication Instructions:  Increase- Coreg 25 mg two times a day       Hydralazine 100 mg three times a day  If you need a refill on your cardiac medications before your next appointment, please call your pharmacy.   Lab work: None   Testing/Procedures: Your physician has requested that you have a carotid duplex. This test is an ultrasound of the carotid arteries in your neck. It looks at blood flow through these arteries that supply the brain with blood. Allow one hour for this exam. There are no restrictions or special instructions. [address]  Follow-Up: At [facility], you and your health needs are our priority.  As part of our continuing mission to provide you with exceptional heart care, we have created designated Provider Care Teams.  These Care Teams include your primary Cardiologist (physician) and Advanced Practice Providers (APPs -  Physician Assistants and Nurse Practitioners) who all work together to provide you with the care you need, when you need it. You will need a follow up appointment in 6 months.  Please call our office 2 months in advance to schedule this appointment.  You may see Bryan Lemma, MD or one of the following Advanced Practice Providers on your designated Care Team:   Theodore Demark, PA-C . Joni Reining, DNP, ANP  Any Other Special Instructions Will Be Listed  Below (If Applicable).  Studies Ordered:   No orders of the defined types were placed in this encounter.     Glenetta Hew, M.D., M.S. Interventional Cardiologist   Pager # [phone] Phone # [phone] [address] [place], [place] [address]   Thank you for choosing [facility] at [facility]!!

## 2017-12-14 ENCOUNTER — Encounter: Payer: Self-pay | Admitting: Cardiology

## 2017-12-14 NOTE — Assessment & Plan Note (Signed)
Basically hypertensive heart disease but with no active heart failure symptoms.  Continue to titrate up antihypertensives as noted.  No requirement for diuretic besides Maxide now.

## 2017-12-14 NOTE — Assessment & Plan Note (Signed)
Now she appears to be back on atorvastatin.  Most recent lipid panel shows total cholesterol 123, triglycerides 120, HDL 40 and LDL 59.  Pretty well controlled.  No change.

## 2017-12-14 NOTE — Assessment & Plan Note (Signed)
Confirmed by echo.  Continue aggressive blood pressure management. Increase hydralazine and carvedilol as noted.

## 2017-12-14 NOTE — Assessment & Plan Note (Signed)
Weakening pulse left arm noted difference in blood pressure as well.  Will check carotid Dopplers focus, subclavian arteries as well.

## 2017-12-14 NOTE — Assessment & Plan Note (Signed)
Previously was well controlled.  I am not sure what is happening now. Plan: Increase carvedilol to 25 mg twice daily and hydralazine to 100 mg 3 times daily. Continue Maxide and Imdur at current doses. Not sure what happened to spironolactone.  Presumably was stopped in the hospital for some reason.  Apparently somewhere along the line carvedilol was also stopped.  She is now back on it.

## 2017-12-14 NOTE — Assessment & Plan Note (Signed)
Mild aortic stenosis on echo back in April 2018.  Will need to reassess after 63-month follow-up.

## 2017-12-20 ENCOUNTER — Ambulatory Visit (HOSPITAL_COMMUNITY)
Admission: RE | Admit: 2017-12-20 | Discharge: 2017-12-20 | Disposition: A | Discharge status: Home / Self Care | Payer: Medicare HMO | Source: Ambulatory Visit | Attending: Cardiology | Admitting: Cardiology

## 2017-12-20 DIAGNOSIS — I771 Stricture of artery: Secondary | ICD-10-CM

## 2017-12-27 ENCOUNTER — Encounter: Payer: Self-pay | Admitting: *Deleted

## 2018-01-03 IMAGING — CT CT ABD-PELV W/ CM
2 of 5 series · 8 of 46 positions shown, 9 images · IV contrast (Iodine)
Comparison: December 28, 2014 and April 23, 2010

CLINICAL DATA: Hematochezia.

EXAM:
CT ABDOMEN AND PELVIS WITH CONTRAST
TECHNIQUE: Multidetector CT imaging of the abdomen and pelvis was performed
using the standard protocol following bolus administration of
intravenous contrast.
CONTRAST:  100mL OMNIPAQUE IOHEXOL 300 MG/ML  SOLN

[Series 201: routine, idose (2) · axial · 0.90mm/px · z∈[+207,+592]mm · 5 of 97 slices shown, 6 images]
[im 10/97  soft-tissue]
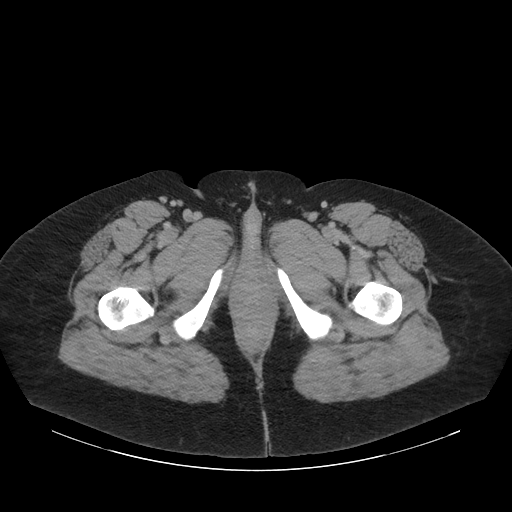
[im 10/97  bone]
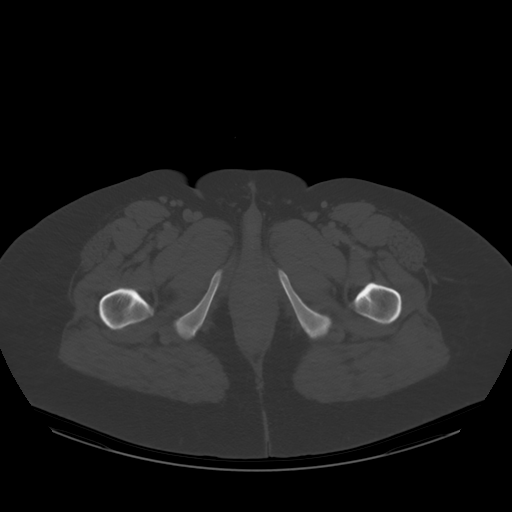
[im 29/97  soft-tissue]
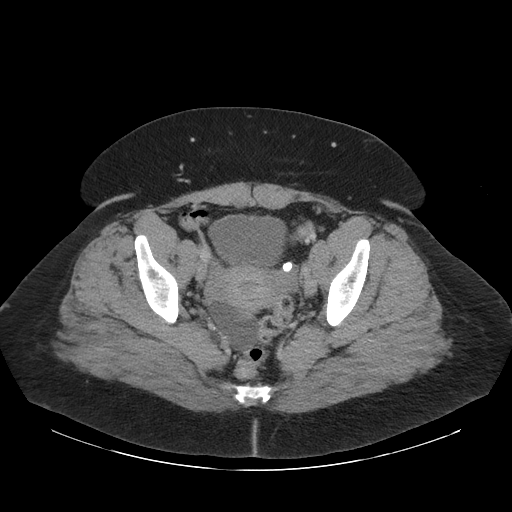
[im 49/97  soft-tissue]
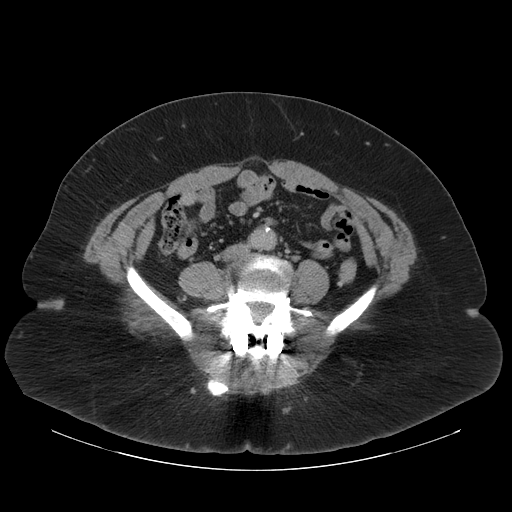
[im 68/97  soft-tissue]
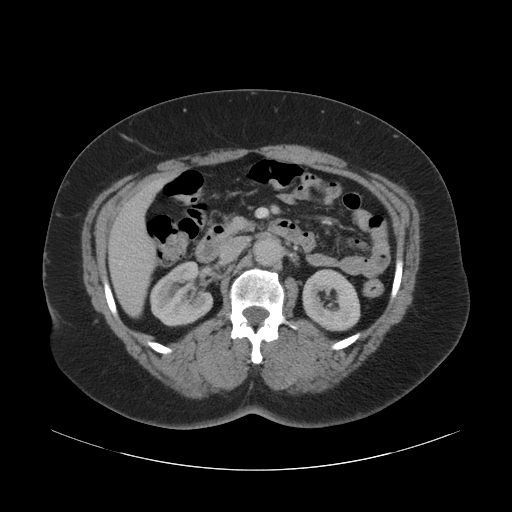
[im 87/97  soft-tissue]
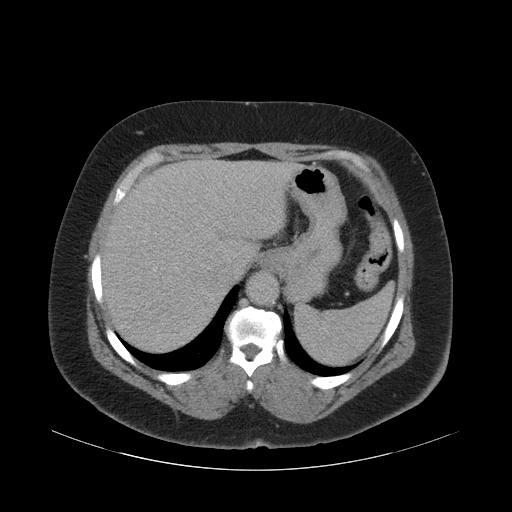

[Series 203: coronals, idose (2) · coronal · 0.45mm/px · 3 of 142 slices shown]
[im 48/142  soft-tissue]
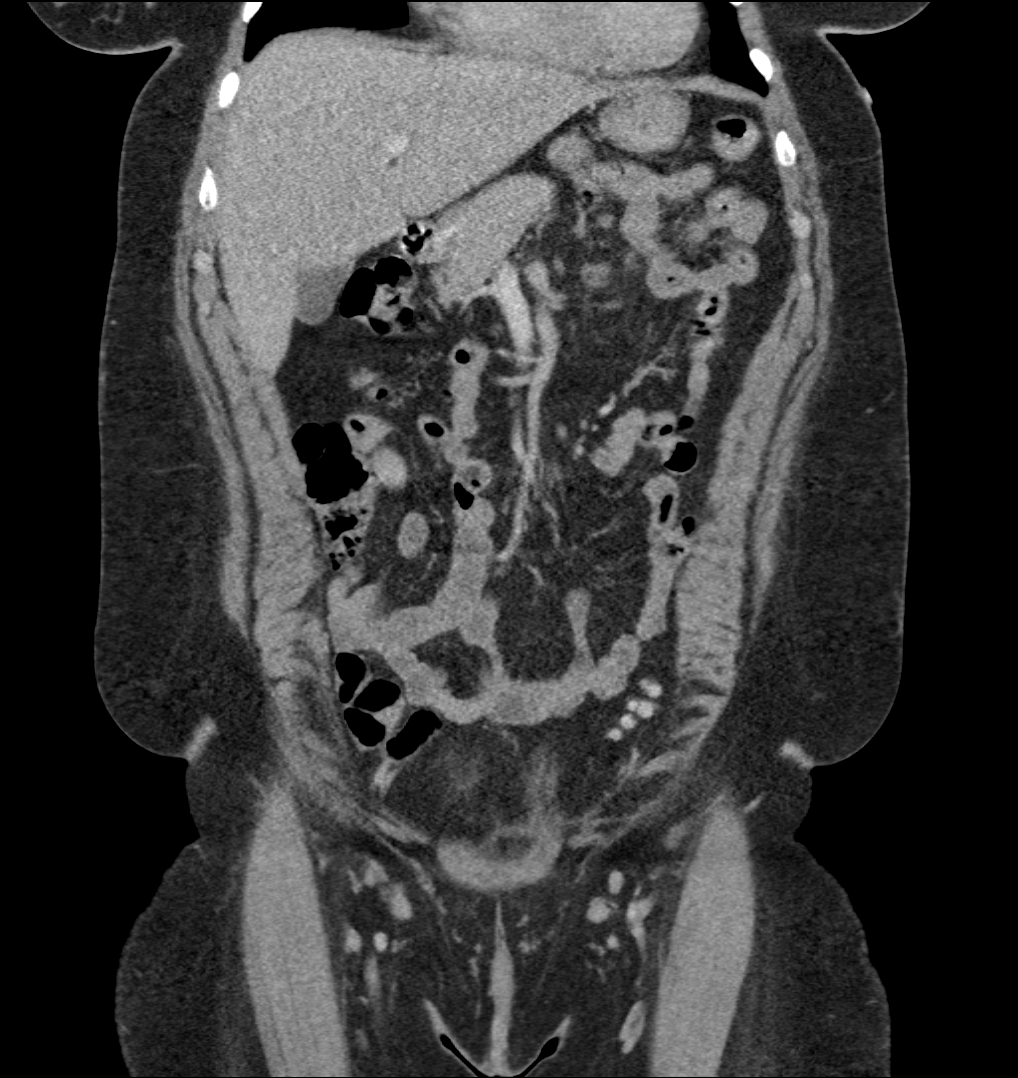
[im 63/142  soft-tissue]
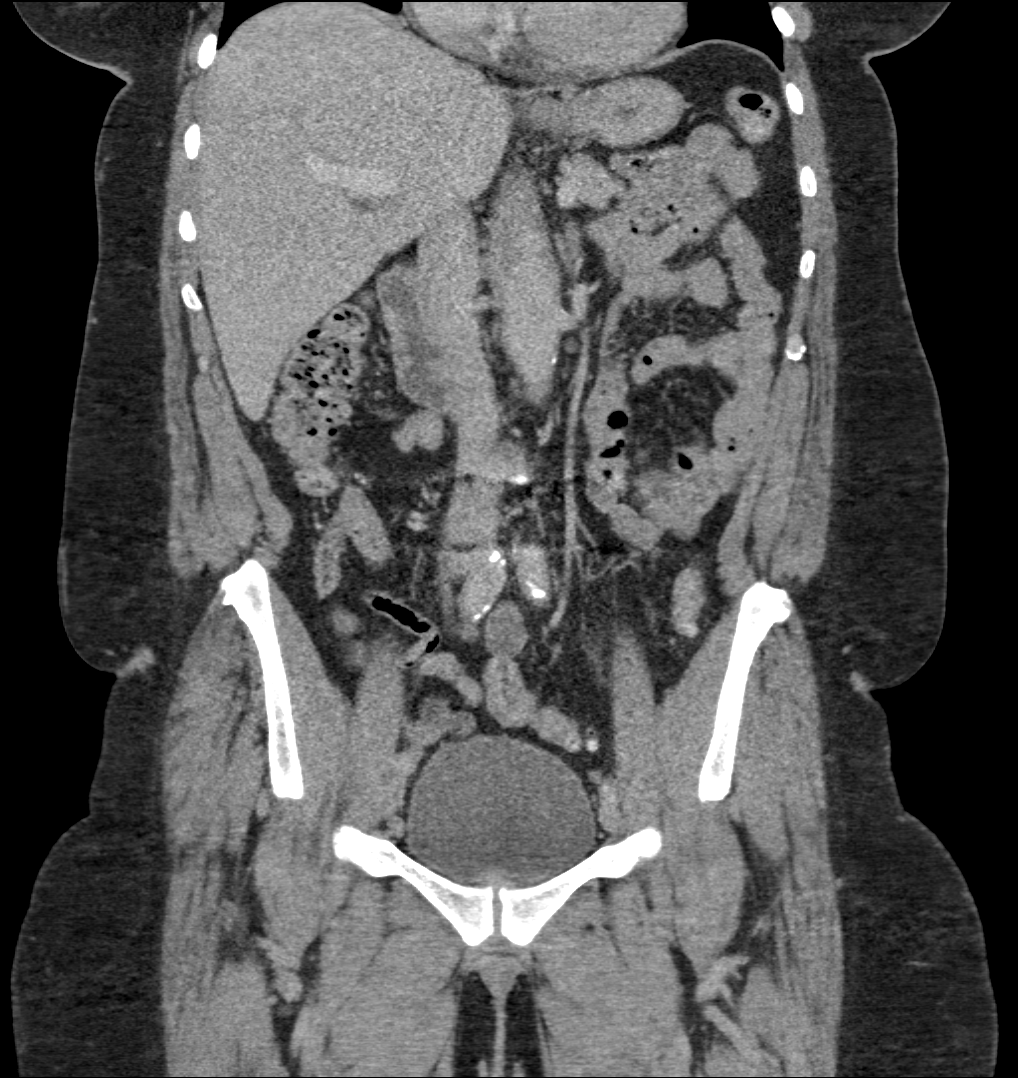
[im 79/142  soft-tissue]
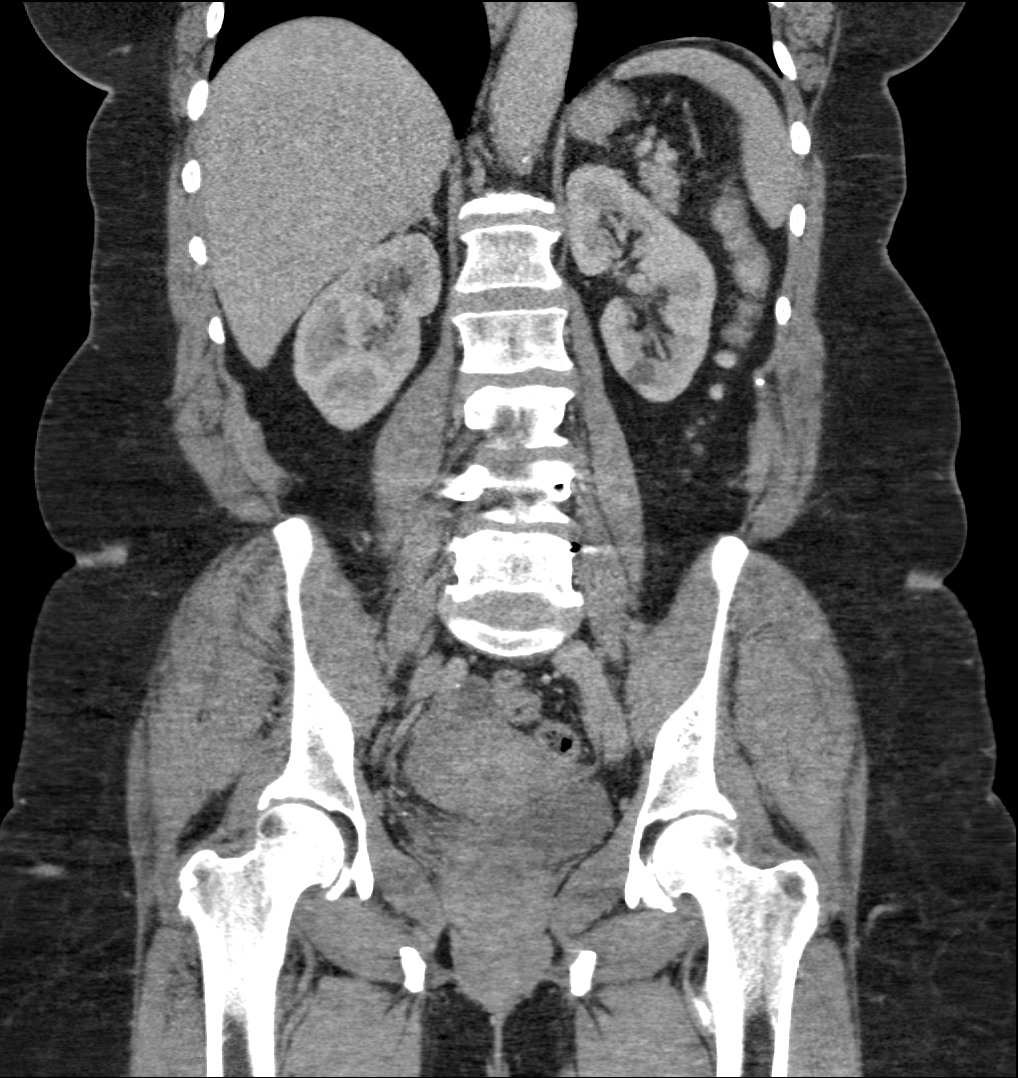

[8 of 46 positions shown; findings below may reference images not displayed]

FINDINGS: Lower chest: There is a small area of airspace consolidation in the
medial aspect of the posterior segment of the right lower lobe. Lung
bases otherwise appear clear.

Hepatobiliary: No focal liver lesions are identified. Gallbladder
wall is not appreciably thickened. There is no biliary duct
dilatation.

Pancreas: There is no pancreatic mass or inflammatory focus.

Spleen: No splenic lesions are identified. There are small splenules
inferior to the spleen.

Adrenals/Urinary Tract: Adrenals appear unremarkable bilaterally.
There is a cyst arising from the lower pole of the left kidney
measuring 1.6 x 1.0 cm. No hydronephrosis on either side. There is
no renal or ureteral calculus on either side. The urinary bladder is
midline with wall thickness within normal limits. Several small
phleboliths are noted in the pelvis.

Stomach/Bowel: There are multiple sigmoid diverticula without
diverticulitis. There is no bowel wall or mesenteric thickening. No
bowel obstruction. No free air or portal venous air.

Vascular/Lymphatic: There is atherosclerotic calcification in the
aorta and iliac arteries. No aneurysm is apparent. No focal vascular
lesions are identified apart from the calcification in the aorta and
iliac arteries. There is no demonstrable adenopathy in the abdomen
or pelvis.

Reproductive: Uterus is anteverted. There is a persistent mass with
cystic attenuation in the right adnexal region measuring 5.1 x
cm, stable. No other pelvic masses seen. No pelvic fluid collections
are appreciable.

Other: The appendix appears normal. There is a small ventral hernia
containing only fat. No abscess or ascites is seen in the abdomen or
pelvis.

Musculoskeletal: The patient has had posterior screw and plate
fixation at L4, L5, and S1 bilaterally. There are no blastic or
lytic bone lesions. There is no intramuscular or abdominal wall
lesions.
IMPRESSION: Chronic cystic adnexal mass on the right. Question simple ovarian
cyst versus ovarian cystadenoma.

Sigmoid diverticula without diverticulitis. No bowel obstruction. No
abscess. Appendix appears normal.

Small area of infiltrate medial right base.

Small ventral hernia containing only fat. Postoperative change in
lumbar spine.

## 2018-01-03 IMAGING — DX DG ABDOMEN 2V
3 series · 3 of 3 positions shown · non-contrast
Comparison: CT abdomen and pelvis December 28, 2014

CLINICAL DATA: Rectal bleeding ; constipation

EXAM:
ABDOMEN - 2 VIEW

[abdomen erect]
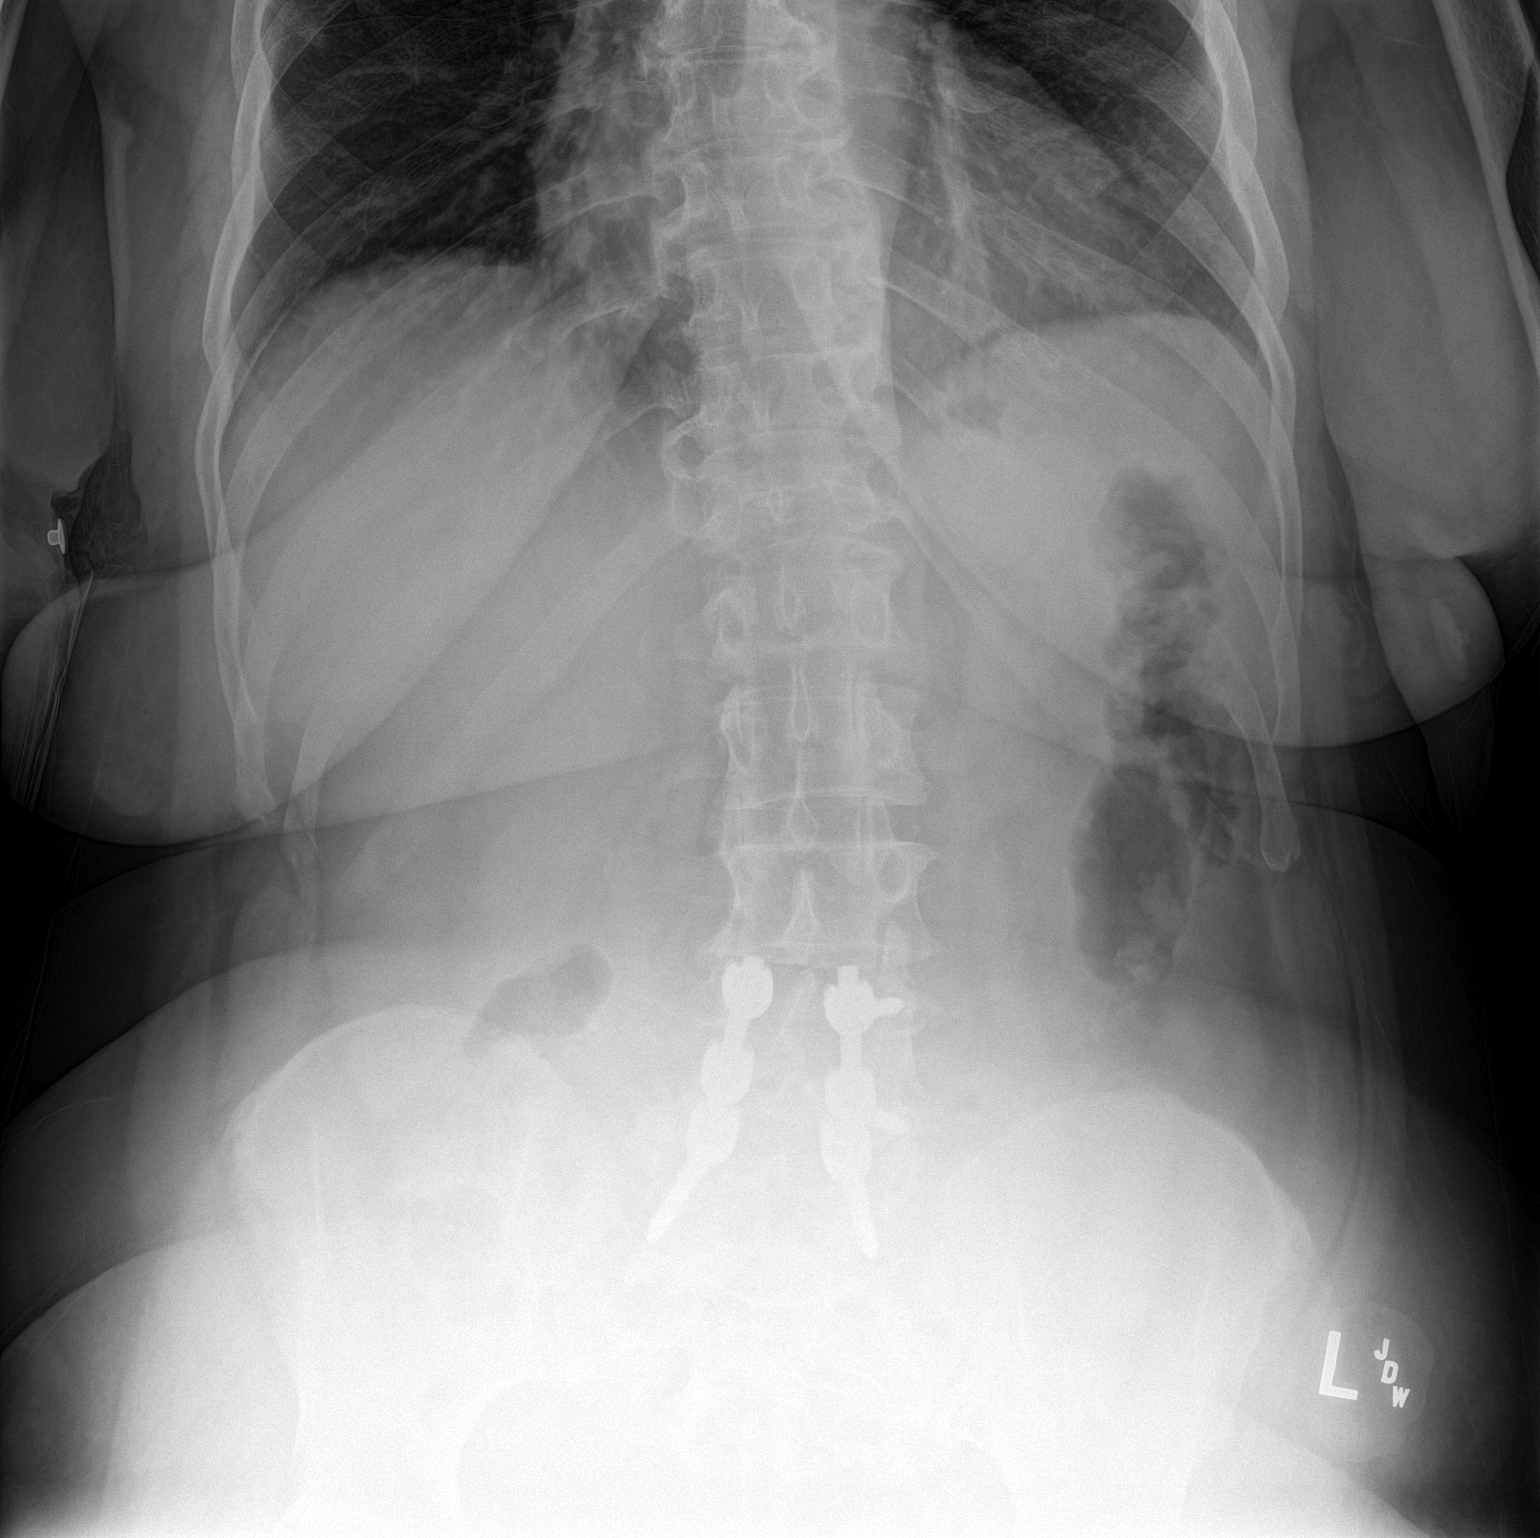

[abdomen supine (1 of 2)]
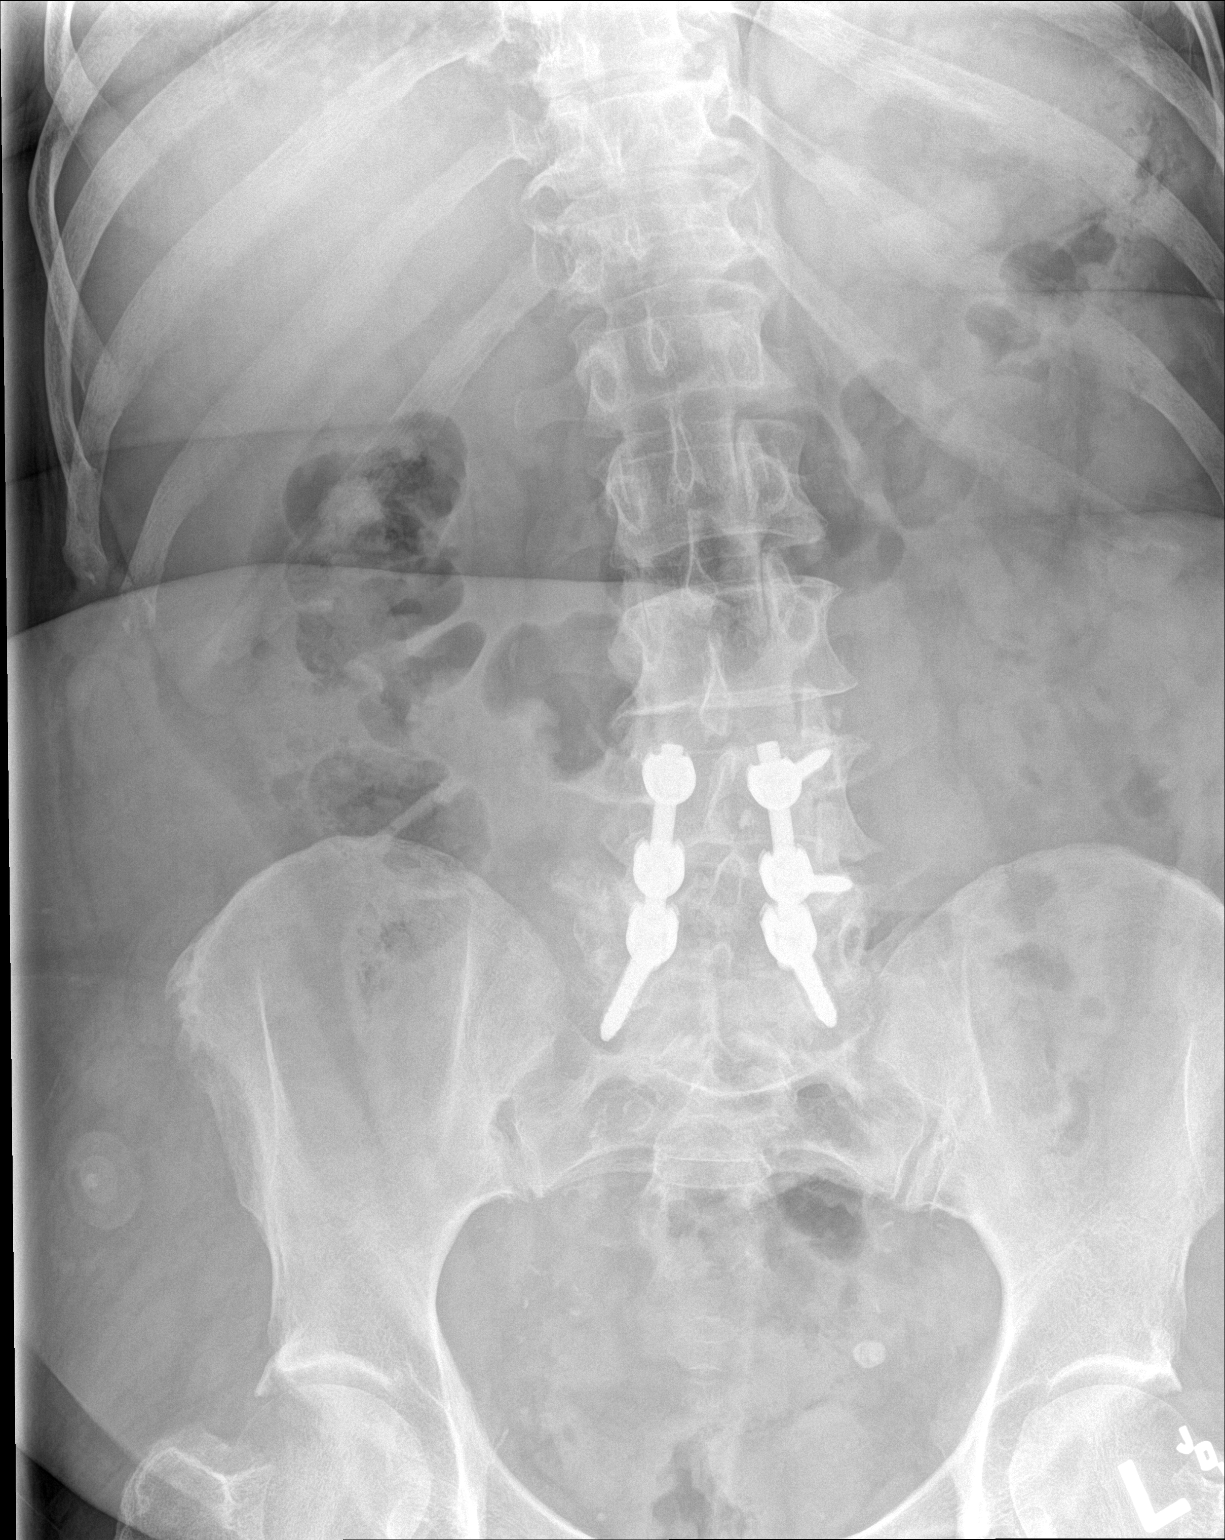

[abdomen supine (2 of 2)]
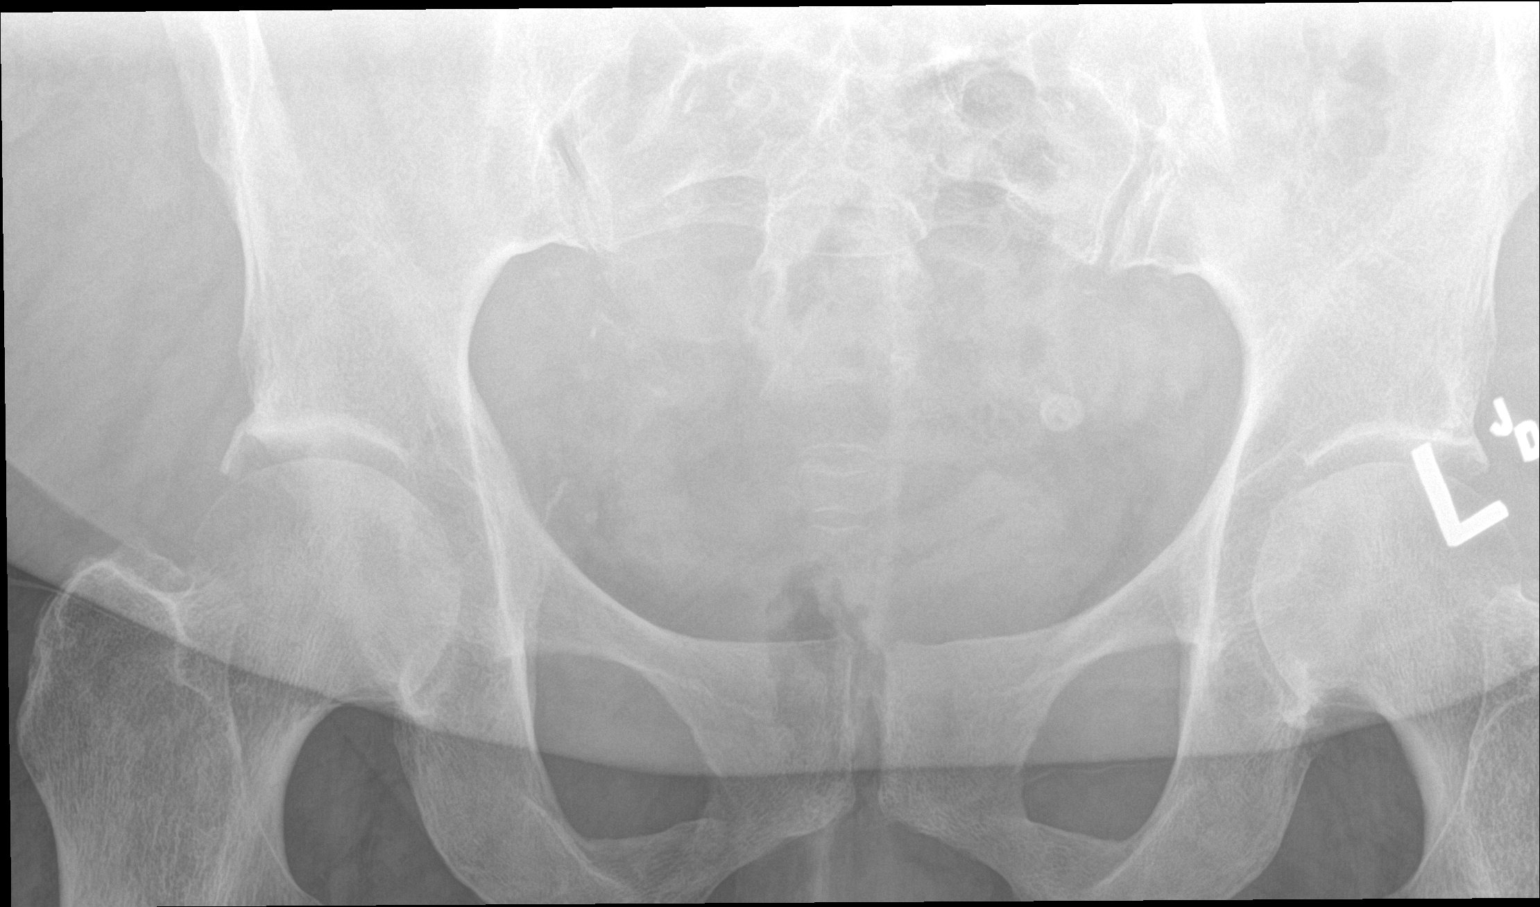

[3 of 3 positions shown; findings below may reference images not displayed]

FINDINGS: Supine and upright images were obtained. There is moderate stool
throughout the colon. There is no bowel dilatation or air-fluid
level suggesting obstruction. No free air. There are vascular
calcifications in the pelvis. There is postoperative change in the
lower lumbar spine. Lung bases are clear.
IMPRESSION: Moderate stool in colon. No obstruction or free air. Lung bases
clear.

## 2018-01-05 ENCOUNTER — Encounter: Payer: Medicare HMO | Admitting: Family Medicine

## 2018-01-09 ENCOUNTER — Other Ambulatory Visit: Payer: Self-pay

## 2018-01-09 ENCOUNTER — Ambulatory Visit (INDEPENDENT_AMBULATORY_CARE_PROVIDER_SITE_OTHER): Payer: Medicare HMO

## 2018-01-09 ENCOUNTER — Encounter: Payer: Self-pay | Admitting: Family Medicine

## 2018-01-09 ENCOUNTER — Ambulatory Visit (INDEPENDENT_AMBULATORY_CARE_PROVIDER_SITE_OTHER): Payer: Medicare HMO | Admitting: Family Medicine

## 2018-01-09 VITALS — BP 156/90 | HR 100 | Temp 98.0°F | Resp 16 | Ht 68.0 in | Wt 245.0 lb

## 2018-01-09 DIAGNOSIS — R000 Tachycardia, unspecified: Secondary | ICD-10-CM

## 2018-01-09 DIAGNOSIS — I5189 Other ill-defined heart diseases: Secondary | ICD-10-CM

## 2018-01-09 DIAGNOSIS — J209 Acute bronchitis, unspecified: Secondary | ICD-10-CM | POA: Diagnosis not present

## 2018-01-09 DIAGNOSIS — I519 Heart disease, unspecified: Secondary | ICD-10-CM | POA: Diagnosis not present

## 2018-01-09 DIAGNOSIS — J4521 Mild intermittent asthma with (acute) exacerbation: Secondary | ICD-10-CM

## 2018-01-09 DIAGNOSIS — I422 Other hypertrophic cardiomyopathy: Secondary | ICD-10-CM

## 2018-01-09 DIAGNOSIS — N183 Chronic kidney disease, stage 3 unspecified: Secondary | ICD-10-CM

## 2018-01-09 DIAGNOSIS — R062 Wheezing: Secondary | ICD-10-CM | POA: Diagnosis not present

## 2018-01-09 DIAGNOSIS — I119 Hypertensive heart disease without heart failure: Secondary | ICD-10-CM

## 2018-01-09 LAB — POCT CBC
Granulocyte percent: 51.9 %G (ref 37–80)
HEMATOCRIT: 42.2 % — AB (ref 29–41)
HEMOGLOBIN: 13.9 g/dL — AB (ref 9.5–13.5)
LYMPH, POC: 2.4 (ref 0.6–3.4)
MCH, POC: 30.5 pg (ref 27–31.2)
MCHC: 32.8 g/dL (ref 31.8–35.4)
MCV: 93 fL (ref 76–111)
MID (cbc): 0.4 (ref 0–0.9)
MPV: 6.8 fL (ref 0–99.8)
PLATELET COUNT, POC: 341 10*3/uL (ref 142–424)
POC GRANULOCYTE: 3 (ref 2–6.9)
POC LYMPH PERCENT: 42 %L (ref 10–50)
POC MID %: 6.1 %M (ref 0–12)
RBC: 4.54 M/uL (ref 4.04–5.48)
RDW, POC: 13.6 %
WBC: 5.8 10*3/uL (ref 4.6–10.2)

## 2018-01-09 LAB — POC INFLUENZA A&B (BINAX/QUICKVUE)
INFLUENZA A, POC: NEGATIVE
INFLUENZA B, POC: NEGATIVE

## 2018-01-09 MED ORDER — ALBUTEROL SULFATE HFA 108 (90 BASE) MCG/ACT IN AERS: 2.0000 | INHALATION_SPRAY | RESPIRATORY_TRACT | 0 refills | Status: DC | PRN

## 2018-01-09 MED ORDER — METHYLPREDNISOLONE SODIUM SUCC 125 MG IJ SOLR
125.0000 mg | Freq: Once | INTRAMUSCULAR | Status: AC
  Administered 2018-01-09: 125 mg via INTRAMUSCULAR

## 2018-01-09 MED ORDER — LEVALBUTEROL HCL 0.63 MG/3ML IN NEBU
0.6300 mg | INHALATION_SOLUTION | Freq: Once | RESPIRATORY_TRACT | Status: AC
  Administered 2018-01-09: 0.63 mg via RESPIRATORY_TRACT

## 2018-01-09 MED ORDER — IPRATROPIUM BROMIDE 0.02 % IN SOLN
0.5000 mg | Freq: Once | RESPIRATORY_TRACT | Status: AC
  Administered 2018-01-09: 0.5 mg via RESPIRATORY_TRACT

## 2018-01-09 MED ORDER — PREDNISONE 20 MG PO TABS: 40.0000 mg | ORAL_TABLET | Freq: Every day | ORAL | 0 refills | Status: DC

## 2018-01-09 MED ORDER — BENZONATATE 200 MG PO CAPS: 200.0000 mg | ORAL_CAPSULE | Freq: Three times a day (TID) | ORAL | 0 refills | Status: DC | PRN

## 2018-01-09 NOTE — Patient Instructions (Addendum)
You should stay away from all cough and cold medications containing decongestant, especially phenylephrine and pseudoephedrine (will be listed under the active ingredient list).  To make it easier, CoricidinHBP is a product tailored towards people with hypertension. YOU CAN ALSO TAKE ROBITUSSIN-DM OR MUCINEX-DM BUT DO NOT TAKE ANY MEDICATION WITH -D ALONE, ONLY THE -DM OR PLAIN MUCINEX/ROBITUSSIN.  You can try the tessalon pearles to suppress cough - just a cough medicine - if you need in addition but your insurance will not cover it or any other type of cough medicine so fine to use the over the counter instead. Start the prednisone tomorrow morning. Take the albuterol inhaler every 4 hours.  Recheck in 2days if not significantly better.    If you have lab work done today you will be contacted with your lab results within the next 2 weeks.  If you have not heard from [place] then please contact us. The fastest way to get your results is to register for My Chart.   IF you received an x-ray today, you will receive an invoice from [facility]. Please contact [facility] at [phone] with questions or concerns regarding your invoice.   IF you received labwork today, you will receive an invoice from [facility]. Please contact [facility] at [phone] with questions or concerns regarding your invoice.   Our billing staff will not be able to assist you with questions regarding bills from these companies.  You will be contacted with the lab results as soon as they are available. The fastest way to get your results is to activate your My Chart account. Instructions are located on the last page of this paperwork. If you have not heard from [place] regarding the results in 2 weeks, please contact this office.     Acute Bronchitis, Adult Acute bronchitis is sudden (acute) swelling of the air tubes (bronchi) in the lungs. Acute bronchitis causes these tubes to fill with mucus, which can make it  hard to breathe. It can also cause coughing or wheezing. In adults, acute bronchitis usually goes away within 2 weeks. A cough caused by bronchitis may last up to 3 weeks. Smoking, allergies, and asthma can make the condition worse. Repeated episodes of bronchitis may cause further lung problems, such as chronic obstructive pulmonary disease (COPD). What are the causes? This condition can be caused by germs and by substances that irritate the lungs, including:  Cold and flu viruses. This condition is most often caused by the same virus that causes a cold.  Bacteria.  Exposure to tobacco smoke, dust, fumes, and air pollution.  What increases the risk? This condition is more likely to develop in people who:  Have close contact with someone with acute bronchitis.  Are exposed to lung irritants, such as tobacco smoke, dust, fumes, and vapors.  Have a weak immune system.  Have a respiratory condition such as asthma.  What are the signs or symptoms? Symptoms of this condition include:  A cough.  Coughing up clear, yellow, or green mucus.  Wheezing.  Chest congestion.  Shortness of breath.  A fever.  Body aches.  Chills.  A sore throat.  How is this diagnosed? This condition is usually diagnosed with a physical exam. During the exam, your health care provider may order tests, such as chest X-rays, to rule out other conditions. He or she may also:  Test a sample of your mucus for bacterial infection.  Check the level of oxygen in your blood. This is done to  check for pneumonia.  Do a chest X-ray or lung function testing to rule out pneumonia and other conditions.  Perform blood tests.  Your health care provider will also ask about your symptoms and medical history. How is this treated? Most cases of acute bronchitis clear up over time without treatment. Your health care provider may recommend:  Drinking more fluids. Drinking more makes your mucus thinner, which may  make it easier to breathe.  Taking a medicine for a fever or cough.  Taking an antibiotic medicine.  Using an inhaler to help improve shortness of breath and to control a cough.  Using a cool mist vaporizer or humidifier to make it easier to breathe.  Follow these instructions at home: Medicines  Take over-the-counter and prescription medicines only as told by your health care provider.  If you were prescribed an antibiotic, take it as told by your health care provider. Do not stop taking the antibiotic even if you start to feel better. General instructions  Get plenty of rest.  Drink enough fluids to keep your urine clear or pale yellow.  Avoid smoking and secondhand smoke. Exposure to cigarette smoke or irritating chemicals will make bronchitis worse. If you smoke and you need help quitting, ask your health care provider. Quitting smoking will help your lungs heal faster.  Use an inhaler, cool mist vaporizer, or humidifier as told by your health care provider.  Keep all follow-up visits as told by your health care provider. This is important. How is this prevented? To lower your risk of getting this condition again:  Wash your hands often with soap and water. If soap and water are not available, use hand sanitizer.  Avoid contact with people who have cold symptoms.  Try not to touch your hands to your mouth, nose, or eyes.  Make sure to get the flu shot every year.  Contact a health care provider if:  Your symptoms do not improve in 2 weeks of treatment. Get help right away if:  You cough up blood.  You have chest pain.  You have severe shortness of breath.  You become dehydrated.  You faint or keep feeling like you are going to faint.  You keep vomiting.  You have a severe headache.  Your fever or chills gets worse. This information is not intended to replace advice given to you by your health care provider. Make sure you discuss any questions you have  with your health care provider. Document Released: 03/30/2004 Document Revised: 09/15/2015 Document Reviewed: 08/11/2015 Elsevier Interactive Patient Education  [employer].

## 2018-01-09 NOTE — Progress Notes (Signed)
Subjective:    Patient: Erin Riley  DOB: 12/31/1950; 67 y.o.   MRN: [id number]  Chief Complaint  Patient presents with  . Cough    x 2 days with wheezing     HPI  For the past 2d she has had URI sxs with sweats and lots of phlegm. No HA, not sure if fever, no chills., +nasal congestion, + sinus pressure/pain, no pharyngitis, +wheeze and + cough productive of yellow and white phlegm.  No arthralgias/myalgias. No GI, tol po but trying to push fluids - lots of fruits.   No pedal edema, no orthopnea but + paroxysmal nocturnal dyspnea. No h/o asthma, COPD, seasonal allergies. Pt states she has taking  any otc meds or other prior rx meds for this - not been around any sick contacts she is awre of. Not had flu shot.  Medical History Past Medical History:  Diagnosis Date  . Chronic back pain   . Chronic kidney disease (CKD), stage II (mild)    Class I-II  . Coronary artery disease 04/2009   50% stenosis in the perforator of LAD; catheterization was for an abnormal Myoview in January 2000 showing anterior and inferolateral ischemia.  . Diverticulitis   . 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 > 3 subsequent Echos show improved/normal EF with moderate to severe concentric LVH and diastolic dysfunction with LVOT/intracavitary gradient --> 06/2016: Severe LVH.  Vigorous EF, 65-70%.?? Gr 1 DD. Mild AS.  [name] History of schizophrenia    However I am not sure about the validity of this. She is not on any medications.  . Hyperlipidemia   . Hypertension   . Hypertensive hypertrophic cardiomyopathy: NYHA class II:  Echo: Severe concentric LVH with LV OT gradient; essentially preserved EF with diastolic dysfunction 02/15/2013   Echo 06/2016: Severe Concentric LVH. Vigorous EF 65-70%. ~ Gr I DD.   . Mild aortic stenosis by prior echocardiography    Echo 06/2016: Mild AS (Mean Gradient 15 mmHg); has had prior  Mod-Severe MR (not seen on current echo)  . PAD (peripheral artery disease) [facility]) March 2013   Lower extremity Dopplers: R. SFA 50-60%, R. PTA proximally occluded with distal reconstitution;; L. common iliac ~50%, L. SFA 50-70% stenosis, L. PTA < 50%  . Schizophrenia (HCC)   . Urinary tract infection    Past Surgical History:  Procedure Laterality Date  . BUNIONECTOMY    . carotid doppler  05/29/2011   left bulb/prox ICA moderate amtfibrous plaque with no evidence significant reduction.,right bulb /proximal ICA normal patency  . lower extremity doppler  05/29/2011   right SFA 50% to 59% diameter reduction,right posterior tibal atreery occlusive disease,reconstituting distally, left common illiac<50%,left SFA 50 to70%,left post. tibial <50%  . NM MYOCAR PERF WALL MOTION  03/2009   Persantine; EF 51%-both anterior and inferolateral ischemia  . TRANSTHORACIC ECHOCARDIOGRAM  06/2016   Severe LVH.  Vigorous EF of 65-70%.  No RWMA. ~Only grade 1 diastolic dysfunction.  Mild aortic stenosis (mean gradient 15 mmHg)  . TRANSTHORACIC ECHOCARDIOGRAM  07/2012   EF 50-55%; severe concentric LVH; only grade 1 diastolic dysfunction. Mild aortic sclerosis - with LVOT /intracavitary gradient of roughly 20 mmHg mean. Mild to moderately dilated LA;; previously reported MR not seen   Current Outpatient Medications on File Prior to Visit  Medication Sig Dispense Refill  . atorvastatin (LIPITOR) 40 MG tablet Take 1 tablet (40 mg total)  by mouth daily. 90 tablet 3  . carvedilol (COREG) 25 MG tablet Take 1 tablet (25 mg total) by mouth daily. 90 tablet 3  . diclofenac sodium (VOLTAREN) 1 % GEL Apply 2 g topically 4 (four) times daily. 100 g 0  . hydrALAZINE (APRESOLINE) 100 MG tablet Take 1 tablet (100 mg total) by mouth 3 (three) times daily. For high blood pressure 270 tablet 3  . isosorbide mononitrate (IMDUR) 60 MG 24 hr tablet Take 1.5 tablets (90 mg total) by mouth daily. 45 tablet 11  . OLANZapine zydis  (ZYPREXA) 20 MG disintegrating tablet     . triamterene-hydrochlorothiazide (MAXZIDE-25) 37.5-25 MG tablet Take 1 tablet by mouth daily. For high blood pressure 30 tablet 0  . Vitamin D, Ergocalciferol, (DRISDOL) 50000 units CAPS capsule Take 50,000 Units by mouth every Monday.     No current facility-administered medications on file prior to visit.    No Known Allergies Family History  Problem Relation Age of Onset  . Hypertension Mother   . Breast cancer Neg Hx    Social History   Socioeconomic History  . Marital status: Single    Spouse name: Not on file  . Number of children: 7  . Years of education: Not on file  . Highest education level: Not on file  Occupational History  . Not on file  Social Needs  . Financial resource strain: Not on file  . Food insecurity:    Worry: Not on file    Inability: Not on file  . Transportation needs:    Medical: Not on file    Non-medical: Not on file  Tobacco Use  . Smoking status: Former Smoker    Types: Cigarettes    Last attempt to quit: 05/14/2002    Years since quitting: 15.6  . Smokeless tobacco: Never Used  Substance and Sexual Activity  . Alcohol use: No  . Drug use: No  . Sexual activity: Yes    Birth control/protection: Post-menopausal  Lifestyle  . Physical activity:    Days per week: Not on file    Minutes per session: Not on file  . Stress: Not on file  Relationships  . Social connections:    Talks on phone: Not on file    Gets together: Not on file    Attends religious service: Not on file    Active member of club or organization: Not on file    Attends meetings of clubs or organizations: Not on file    Relationship status: Not on file  Other Topics Concern  . Not on file  Social History Narrative   Now single mother of 2 with one grandchild. She quit smoking roughly 5 years ago and is not so since. She has also stopped drinking alcohol. She does try get routine exercise walking at least a mile 3-4 days a  week.    She lives with her 41 year old mother. 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   Depression screen [facility] 2/9 01/09/2018 11/13/2017 08/31/2017  Decreased Interest 0 0 0  Down, Depressed, Hopeless 0 0 0  PHQ - 2 Score 0 0 0    ROS As noted in HPI  Objective:  BP (!) 158/90   Pulse (!) 117   Temp 98 F (36.7 C) (Oral)   Resp 16   Ht 5\' 8"  (1.727 m)   Wt 245 lb (111.1 kg)   LMP 05/10/2013   SpO2 96%   BMI 37.25 kg/m   Recheck Physical Exam  Constitutional:  She is oriented to person, place, and time. She appears well-developed and well-nourished. She appears lethargic. She appears ill. No distress.  HENT:  Head: Normocephalic and atraumatic.  Right Ear: Tympanic membrane, external ear and ear canal normal.  Left Ear: Tympanic membrane, external ear and ear canal normal.  Nose: Rhinorrhea present. No mucosal edema. Right sinus exhibits no maxillary sinus tenderness. Left sinus exhibits no maxillary sinus tenderness.  Mouth/Throat: Uvula is midline and mucous membranes are normal. Posterior oropharyngeal erythema present. No oropharyngeal exudate or posterior oropharyngeal edema.  Eyes: Conjunctivae are normal. Right eye exhibits no discharge. Left eye exhibits no discharge. No scleral icterus.  Neck: Normal range of motion. Neck supple.  Cardiovascular: Regular rhythm, normal heart sounds and intact distal pulses. Tachycardia present.  Pulmonary/Chest: Effort normal. Tachypnea noted. No respiratory distress. She has decreased breath sounds (sig improved after duoneb). She has wheezes. She has no rhonchi. She has no rales.  Lymphadenopathy:    She has no cervical adenopathy.  Neurological: She is oriented to person, place, and time. She appears lethargic.  Skin: Skin is warm and dry. She is not diaphoretic. No erythema.  Psychiatric: She has a normal mood and affect. Her behavior is normal.    POC TESTING Office Visit on 01/09/2018  Component Date Value Ref Range Status  . WBC  01/09/2018 5.8  4.6 - 10.2 K/uL Final  . Lymph, poc 01/09/2018 2.4  0.6 - 3.4 Final  . POC LYMPH PERCENT 01/09/2018 42.0  10 - 50 %L Final  . MID (cbc) 01/09/2018 0.4  0 - 0.9 Final  . POC MID % 01/09/2018 6.1  0 - 12 %M Final  . POC Granulocyte 01/09/2018 3.0  2 - 6.9 Final  . Granulocyte percent 01/09/2018 51.9  37 - 80 %G Final  . RBC 01/09/2018 4.54  4.04 - 5.48 M/uL Final  . Hemoglobin 01/09/2018 13.9* 9.5 - 13.5 g/dL Final  . HCT, POC 40/98/1191 42.2* 29 - 41 % Final  . MCV 01/09/2018 93.0  76 - 111 fL Final  . MCH, POC 01/09/2018 30.5  27 - 31.2 pg Final  . MCHC 01/09/2018 32.8  31.8 - 35.4 g/dL Final  . RDW, POC 47/82/9562 13.6  % Final  . Platelet Count, POC 01/09/2018 341  142 - 424 K/uL Final  . MPV 01/09/2018 6.8  0 - 99.8 fL Final  . Influenza A, POC 01/09/2018 Negative  Negative Final  . Influenza B, POC 01/09/2018 Negative  Negative Final    Dg Chest 2 View  Result Date: 01/09/2018 CLINICAL DATA:  Wheezing and cough improved on vasodilators. History of coronary artery disease, CHF, cardiomyopathy. Former smoker. EXAM: CHEST - 2 VIEW COMPARISON:  PA and lateral chest x-ray of August 14, 2016 FINDINGS: The lungs are well-expanded. There is no focal infiltrate. The interstitial markings are minimally prominent but stable. The heart and pulmonary vascularity are normal. There is tortuosity of the ascending and descending thoracic aorta. The trachea is midline. The bony thorax exhibits no acute abnormality. There is mild multilevel degenerative disc disease of the thoracic spine. IMPRESSION: There is no acute pneumonia nor CHF. Thoracic aortic atherosclerosis. Electronically Signed   By: David  [place] M.D.   On: 01/09/2018 17:08   Assessment & Plan:   1. Acute bronchitis, unspecified organism - if worsens at all, f/u immed and cons adding zpack but cxr and cbc nml. Pt with sig improvement in sxs, improved air mvmnt and decreased breath sig after alb/ipratrop neb, and HR  decreased to  100 - O2 sat nml.  So start albuterol inhaler and gave solumedrol inj today - start prednisone tomorrow.   See AVS - can try tessalon for sxs if want but don't need and ins won't cover sxs med so ok to use otc guaifenesin-dm but no decongestants  2. Wheezing   3. Tachycardia   4. CKD (chronic kidney disease) stage 3, GFR 30-59 ml/min (HCC)   5. Hypertensive hypertrophic cardiomyopathy, without heart failure (HCC)   6. Left ventricular diastolic dysfunction, NYHA class 1 - no sxs of fluid overload and or change on cxr, futher r/o as bnp  7. Mild intermittent reactive airway disease with acute exacerbation - pt denies any h/o ashtma, copd, allergies but responded so well to duoneb will trx for RAD w/ steroids and bronchodialtors.      See after visit summary for patient specific instructions.  Orders Placed This Encounter  Procedures  . DG Chest 2 View    Standing Status:   Future    Number of Occurrences:   1    Standing Expiration Date:   01/09/2019    Order Specific Question:   Reason for Exam (SYMPTOM  OR DIAGNOSIS REQUIRED)    Answer:   wheeze, cough, improved with broncodilators, no h/o RAD, + h/o CHF    Order Specific Question:   Preferred imaging location?    Answer:   External  . Brain natriuretic peptide  . POCT CBC  . POC Influenza A&B(BINAX/QUICKVUE)    Meds ordered this encounter  Medications  . levalbuterol (XOPENEX) nebulizer solution 0.63 mg  . ipratropium (ATROVENT) nebulizer solution 0.5 mg  . methylPREDNISolone sodium succinate (SOLU-MEDROL) 125 mg/2 mL injection 125 mg  . predniSONE (DELTASONE) 20 MG tablet    Sig: Take 2 tablets (40 mg total) by mouth daily with breakfast.    Dispense:  10 tablet    Refill:  0  . albuterol (VENTOLIN HFA) 108 (90 Base) MCG/ACT inhaler    Sig: Inhale 2 puffs into the lungs every 4 (four) hours as needed for wheezing or shortness of breath (or cough).    Dispense:  1 Inhaler    Refill:  0  . benzonatate (TESSALON) 200 MG  capsule    Sig: Take 1 capsule (200 mg total) by mouth 3 (three) times daily as needed for cough.    Dispense:  40 capsule    Refill:  0    Patient verbalized to me that they understand the following: diagnosis, what is being done for them, what to expect and what should be done at home.  Their questions have been answered. They understand that I am unable to predict every possible medication interaction or adverse outcome and that if any unexpected symptoms arise, they should contact us and their pharmacist, as well as never hesitate to seek urgent/emergent care at [facility] or ER if they think it might be warranted.    Norberto Sorenson, MD, MPH Primary Care at [facility] [address] [place], [place]  [address] [phone] Office phone  [phone] Office fax  01/09/18 4:14 PM

## 2018-01-10 LAB — BRAIN NATRIURETIC PEPTIDE: BNP: 30.4 pg/mL (ref 0.0–100.0)

## 2018-01-20 ENCOUNTER — Inpatient Hospital Stay (HOSPITAL_COMMUNITY)
Admission: AD | Admit: 2018-01-20 | Discharge: 2018-01-20 | Disposition: A | Discharge status: Home / Self Care | Payer: Medicare HMO | Source: Ambulatory Visit | Attending: Family Medicine | Admitting: Family Medicine

## 2018-01-20 DIAGNOSIS — R928 Other abnormal and inconclusive findings on diagnostic imaging of breast: Secondary | ICD-10-CM | POA: Diagnosis not present

## 2018-01-20 DIAGNOSIS — Z87891 Personal history of nicotine dependence: Secondary | ICD-10-CM | POA: Insufficient documentation

## 2018-01-20 DIAGNOSIS — G8929 Other chronic pain: Secondary | ICD-10-CM | POA: Diagnosis not present

## 2018-01-20 DIAGNOSIS — M549 Dorsalgia, unspecified: Secondary | ICD-10-CM | POA: Insufficient documentation

## 2018-01-20 DIAGNOSIS — I129 Hypertensive chronic kidney disease with stage 1 through stage 4 chronic kidney disease, or unspecified chronic kidney disease: Secondary | ICD-10-CM | POA: Insufficient documentation

## 2018-01-20 DIAGNOSIS — Z79899 Other long term (current) drug therapy: Secondary | ICD-10-CM | POA: Diagnosis not present

## 2018-01-20 DIAGNOSIS — I739 Peripheral vascular disease, unspecified: Secondary | ICD-10-CM | POA: Insufficient documentation

## 2018-01-20 DIAGNOSIS — N390 Urinary tract infection, site not specified: Secondary | ICD-10-CM | POA: Diagnosis not present

## 2018-01-20 DIAGNOSIS — F209 Schizophrenia, unspecified: Secondary | ICD-10-CM | POA: Diagnosis not present

## 2018-01-20 DIAGNOSIS — Z8249 Family history of ischemic heart disease and other diseases of the circulatory system: Secondary | ICD-10-CM | POA: Insufficient documentation

## 2018-01-20 DIAGNOSIS — N3001 Acute cystitis with hematuria: Secondary | ICD-10-CM

## 2018-01-20 DIAGNOSIS — I420 Dilated cardiomyopathy: Secondary | ICD-10-CM | POA: Insufficient documentation

## 2018-01-20 DIAGNOSIS — I2510 Atherosclerotic heart disease of native coronary artery without angina pectoris: Secondary | ICD-10-CM | POA: Diagnosis not present

## 2018-01-20 DIAGNOSIS — N182 Chronic kidney disease, stage 2 (mild): Secondary | ICD-10-CM | POA: Insufficient documentation

## 2018-01-20 DIAGNOSIS — E785 Hyperlipidemia, unspecified: Secondary | ICD-10-CM | POA: Insufficient documentation

## 2018-01-20 DIAGNOSIS — I10 Essential (primary) hypertension: Secondary | ICD-10-CM

## 2018-01-20 DIAGNOSIS — R102 Pelvic and perineal pain: Secondary | ICD-10-CM | POA: Diagnosis present

## 2018-01-20 LAB — URINALYSIS, ROUTINE W REFLEX MICROSCOPIC
Bilirubin Urine: NEGATIVE
GLUCOSE, UA: NEGATIVE mg/dL
KETONES UR: NEGATIVE mg/dL
NITRITE: NEGATIVE
Protein, ur: 100 mg/dL — AB
Specific Gravity, Urine: 1.014 (ref 1.005–1.030)
pH: 7 (ref 5.0–8.0)

## 2018-01-20 LAB — WET PREP, GENITAL
Clue Cells Wet Prep HPF POC: NONE SEEN
Sperm: NONE SEEN
Trich, Wet Prep: NONE SEEN
YEAST WET PREP: NONE SEEN

## 2018-01-20 MED ORDER — SULFAMETHOXAZOLE-TRIMETHOPRIM 800-160 MG PO TABS: 1.0000 | ORAL_TABLET | Freq: Two times a day (BID) | ORAL | 0 refills | Status: AC

## 2018-01-20 NOTE — Discharge Instructions (Signed)

## 2018-01-20 NOTE — MAU Provider Note (Signed)
History     CSN: [id number]  Arrival date and time: 01/20/18 4098   First Provider Initiated Contact with Patient 01/20/18 1019      Chief Complaint  Patient presents with  . Vaginal Pain   HPI  Erin Riley is a 67 y.o. [phone] non-pregnant patient who presents to MAU with multiple complaints:  Vaginal itching, burning and abnormal discharge This is a new problem, onset within the past week. Patient endorses consistent vaginal itching and burning throughout the day. She also endorses abnormal vaginal discharge which is "greenish yellow" and foul-smelling. Denies pain. Denies aggravating or alleviating factors. Has not attempted medication or other treatments.  Painful urination This is a new problem, onset within the past week. Patient endorses moderate discomfort, beginning with onset of urination and continuing the entire time she is voiding. Endorses history of UTIs. Denies abdominal tenderness, flank pain, history of kidney stones.  Patient states she still has a period each month and her mother was the same way throughout her 31s. Denies sexual intercourse. Denies concern for STI exposure.   Patient lives in a ground condo with her son. Denies activity intolerance, SOB, episodes of dizziness.  Patient has not taken her daily HCTZ this morning but has meds in her purse and will take after snack is provided.  Pertinent Gynecological History: Menses: flow is light Bleeding: N/A Contraception: none DES exposure: denies Blood transfusions: none Sexually transmitted diseases: currently at risk Previous GYN Procedures: None  Last mammogram: abnormal: focal asymmetry, patient refused stereotactic biopsy Date: 03/21/17 Last pap: normal Date: 12/25/2016   Past Medical History:  Diagnosis Date  . Chronic back pain   . Chronic kidney disease (CKD), stage II (mild)    Class I-II  . Coronary artery disease 04/2009   50% stenosis in the perforator of LAD; catheterization was for  an abnormal Myoview in January 2000 showing anterior and inferolateral ischemia.  . Diverticulitis   . 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 > 3 subsequent Echos show improved/normal EF with moderate to severe concentric LVH and diastolic dysfunction with LVOT/intracavitary gradient --> 06/2016: Severe LVH.  Vigorous EF, 65-70%.?? Gr 1 DD. Mild AS.  [name] History of schizophrenia    However I am not sure about the validity of this. She is not on any medications.  . Hyperlipidemia   . Hypertension   . Hypertensive hypertrophic cardiomyopathy: NYHA class II:  Echo: Severe concentric LVH with LV OT gradient; essentially preserved EF with diastolic dysfunction 02/15/2013   Echo 06/2016: Severe Concentric LVH. Vigorous EF 65-70%. ~ Gr I DD.   . Mild aortic stenosis by prior echocardiography    Echo 06/2016: Mild AS (Mean Gradient 15 mmHg); has had prior Mod-Severe MR (not seen on current echo)  . PAD (peripheral artery disease) [facility]) March 2013   Lower extremity Dopplers: R. SFA 50-60%, R. PTA proximally occluded with distal reconstitution;; L. common iliac ~50%, L. SFA 50-70% stenosis, L. PTA < 50%  . Schizophrenia (HCC)   . Urinary tract infection     Past Surgical History:  Procedure Laterality Date  . BUNIONECTOMY    . carotid doppler  05/29/2011   left bulb/prox ICA moderate amtfibrous plaque with no evidence significant reduction.,right bulb /proximal ICA normal patency  . lower extremity doppler  05/29/2011   right SFA 50% to 59% diameter reduction,right posterior tibal atreery occlusive disease,reconstituting distally, left common illiac<50%,left SFA  50 to70%,left post. tibial <50%  . NM MYOCAR PERF WALL MOTION  03/2009   Persantine; EF 51%-both anterior and inferolateral ischemia  . TRANSTHORACIC ECHOCARDIOGRAM  06/2016   Severe LVH.  Vigorous EF of 65-70%.  No RWMA. ~Only grade 1 diastolic  dysfunction.  Mild aortic stenosis (mean gradient 15 mmHg)  . TRANSTHORACIC ECHOCARDIOGRAM  07/2012   EF 50-55%; severe concentric LVH; only grade 1 diastolic dysfunction. Mild aortic sclerosis - with LVOT /intracavitary gradient of roughly 20 mmHg mean. Mild to moderately dilated LA;; previously reported MR not seen    Family History  Problem Relation Age of Onset  . Hypertension Mother   . Breast cancer Neg Hx     Social History   Tobacco Use  . Smoking status: Former Smoker    Types: Cigarettes    Last attempt to quit: 05/14/2002    Years since quitting: 15.6  . Smokeless tobacco: Never Used  Substance Use Topics  . Alcohol use: No  . Drug use: No    Allergies: No Known Allergies  Medications Prior to Admission  Medication Sig Dispense Refill Last Dose  . albuterol (VENTOLIN HFA) 108 (90 Base) MCG/ACT inhaler Inhale 2 puffs into the lungs every 4 (four) hours as needed for wheezing or shortness of breath (or cough). 1 Inhaler 0   . atorvastatin (LIPITOR) 40 MG tablet Take 1 tablet (40 mg total) by mouth daily. 90 tablet 3 Taking  . benzonatate (TESSALON) 200 MG capsule Take 1 capsule (200 mg total) by mouth 3 (three) times daily as needed for cough. 40 capsule 0   . carvedilol (COREG) 25 MG tablet Take 1 tablet (25 mg total) by mouth daily. 90 tablet 3 Taking  . diclofenac sodium (VOLTAREN) 1 % GEL Apply 2 g topically 4 (four) times daily. 100 g 0 Taking  . hydrALAZINE (APRESOLINE) 100 MG tablet Take 1 tablet (100 mg total) by mouth 3 (three) times daily. For high blood pressure 270 tablet 3 Taking  . isosorbide mononitrate (IMDUR) 60 MG 24 hr tablet Take 1.5 tablets (90 mg total) by mouth daily. 45 tablet 11 Taking  . OLANZapine zydis (ZYPREXA) 20 MG disintegrating tablet    Taking  . predniSONE (DELTASONE) 20 MG tablet Take 2 tablets (40 mg total) by mouth daily with breakfast. 10 tablet 0   . triamterene-hydrochlorothiazide (MAXZIDE-25) 37.5-25 MG tablet Take 1 tablet by  mouth daily. For high blood pressure 30 tablet 0 Taking  . Vitamin D, Ergocalciferol, (DRISDOL) 50000 units CAPS capsule Take 50,000 Units by mouth every Monday.   Taking    Review of Systems  Constitutional: Negative for chills, fatigue and fever.  Gastrointestinal: Negative for abdominal pain, diarrhea, nausea and vomiting.  Endocrine: Negative for polydipsia, polyphagia and polyuria.  Genitourinary: Positive for dysuria, vaginal discharge and vaginal pain. Negative for decreased urine volume, difficulty urinating, flank pain, frequency, hematuria, pelvic pain, urgency and vaginal bleeding.  Musculoskeletal: Negative for back pain.  Neurological: Negative for dizziness, syncope, weakness and headaches.   Physical Exam   Blood pressure (!) 197/117, pulse 79, temperature 97.8 F (36.6 C), temperature source Oral, resp. rate 18, weight 113 kg, last menstrual period 05/10/2013.  Physical Exam  Nursing note and vitals reviewed. Constitutional: She is oriented to person, place, and time. She appears well-developed and well-nourished.  Cardiovascular: Normal rate and normal pulses.  Respiratory: Effort normal and breath sounds normal. No accessory muscle usage. No respiratory distress. She has no wheezes. She has no rales. She exhibits  no tenderness.  GI: Soft. Bowel sounds are normal. She exhibits no distension. There is no tenderness. There is no rebound, no guarding and no CVA tenderness.  Genitourinary: Vagina normal and uterus normal. Cervix exhibits no motion tenderness, no discharge and no friability. No vaginal discharge found.  Musculoskeletal: Normal range of motion.  Neurological: She is alert and oriented to person, place, and time. She has normal reflexes.  Skin: Skin is warm and dry.  Psychiatric: She has a normal mood and affect. Her behavior is normal. Judgment and thought content normal.    MAU Course/MDM   --BP 166/90 at 11am after Hydralazine taken at 1040am --Patient's  chart reflects abnormal mammogram 03/2017. Patient advised to pursue biopsy, patient declined, stating she preferred to have follow-up mammography in six months. Did not occur. --Low suspicion for kidney stones. Pertinent negatives: nausea/vomiting, fever, flank pain, pelvic pain . Reviewed signs and symptoms based on hematuria  Patient Vitals for the past 24 hrs:  BP Temp Temp src Pulse Resp Weight  01/20/18 1100 (!) 166/90 - - 88 - -  01/20/18 1029 (!) 197/117 - - 79 - -  01/20/18 1003 (!) 205/113 97.8 F (36.6 C) Oral 79 18 113 kg    Results for orders placed or performed during the hospital encounter of 01/20/18 (from the past 24 hour(s))  Wet prep, genital     Status: Abnormal   Collection Time: 01/20/18 10:07 AM  Result Value Ref Range   Yeast Wet Prep HPF POC NONE SEEN NONE SEEN   Trich, Wet Prep NONE SEEN NONE SEEN   Clue Cells Wet Prep HPF POC NONE SEEN NONE SEEN   WBC, Wet Prep HPF POC FEW (A) NONE SEEN   Sperm NONE SEEN   Urinalysis, Routine w reflex microscopic     Status: Abnormal   Collection Time: 01/20/18 10:13 AM  Result Value Ref Range   Color, Urine YELLOW YELLOW   APPearance CLOUDY (A) CLEAR   Specific Gravity, Urine 1.014 1.005 - 1.030   pH 7.0 5.0 - 8.0   Glucose, UA NEGATIVE NEGATIVE mg/dL   Hgb urine dipstick SMALL (A) NEGATIVE   Bilirubin Urine NEGATIVE NEGATIVE   Ketones, ur NEGATIVE NEGATIVE mg/dL   Protein, ur 956 (A) NEGATIVE mg/dL   Nitrite NEGATIVE NEGATIVE   Leukocytes, UA LARGE (A) NEGATIVE   RBC / HPF 11-20 0 - 5 RBC/hpf   WBC, UA >50 (H) 0 - 5 WBC/hpf   Bacteria, UA RARE (A) NONE SEEN   Squamous Epithelial / LPF 6-10 0 - 5   WBC Clumps PRESENT    Mucus PRESENT    Meds ordered this encounter  Medications  . sulfamethoxazole-trimethoprim (BACTRIM DS,SEPTRA DS) 800-160 MG tablet    Sig: Take 1 tablet by mouth 2 (two) times daily for 7 days.    Dispense:  14 tablet    Refill:  0    Order Specific Question:   Supervising Provider    Answer:    Reva Bores [[id number]]    Assessment and Plan  --67 y.o. [phone]  --UTI, rx to patient pharmacy --Essential Hypertension controlled when patient takes prescribed medications --Outpatient order placed for diagnostic mammogram. Patient understand she should expect a scheduling phone call. --Reviewed criteria for return to MAU or closest ED  Calvert Cantor, CNM 01/20/2018, 11:57 AM

## 2018-01-20 NOTE — MAU Note (Signed)
Pt states she has vaginal infection, has itching & burning, yellow discharge.

## 2018-01-21 LAB — GC/CHLAMYDIA PROBE AMP (~~LOC~~) NOT AT ARMC
CHLAMYDIA, DNA PROBE: NEGATIVE
NEISSERIA GONORRHEA: NEGATIVE

## 2018-01-22 LAB — URINE CULTURE
Culture: >=100000 — AB
Special Requests: NORMAL

## 2018-01-27 ENCOUNTER — Other Ambulatory Visit: Payer: Self-pay

## 2018-01-27 ENCOUNTER — Encounter (HOSPITAL_COMMUNITY): Payer: Self-pay | Admitting: *Deleted

## 2018-01-27 ENCOUNTER — Emergency Department (HOSPITAL_COMMUNITY)
Admission: EM | Admit: 2018-01-27 | Discharge: 2018-01-27 | Disposition: A | Discharge status: Home / Self Care | Payer: Medicare HMO | Attending: Emergency Medicine | Admitting: Emergency Medicine

## 2018-01-27 DIAGNOSIS — Z79899 Other long term (current) drug therapy: Secondary | ICD-10-CM | POA: Diagnosis not present

## 2018-01-27 DIAGNOSIS — Z87891 Personal history of nicotine dependence: Secondary | ICD-10-CM | POA: Insufficient documentation

## 2018-01-27 DIAGNOSIS — I129 Hypertensive chronic kidney disease with stage 1 through stage 4 chronic kidney disease, or unspecified chronic kidney disease: Secondary | ICD-10-CM | POA: Insufficient documentation

## 2018-01-27 DIAGNOSIS — N183 Chronic kidney disease, stage 3 (moderate): Secondary | ICD-10-CM | POA: Diagnosis not present

## 2018-01-27 DIAGNOSIS — I2510 Atherosclerotic heart disease of native coronary artery without angina pectoris: Secondary | ICD-10-CM | POA: Insufficient documentation

## 2018-01-27 DIAGNOSIS — B9689 Other specified bacterial agents as the cause of diseases classified elsewhere: Secondary | ICD-10-CM | POA: Diagnosis not present

## 2018-01-27 DIAGNOSIS — N760 Acute vaginitis: Secondary | ICD-10-CM | POA: Insufficient documentation

## 2018-01-27 DIAGNOSIS — N898 Other specified noninflammatory disorders of vagina: Secondary | ICD-10-CM | POA: Diagnosis present

## 2018-01-27 DIAGNOSIS — I10 Essential (primary) hypertension: Secondary | ICD-10-CM

## 2018-01-27 LAB — URINALYSIS, ROUTINE W REFLEX MICROSCOPIC
Bilirubin Urine: NEGATIVE
Glucose, UA: NEGATIVE mg/dL
HGB URINE DIPSTICK: NEGATIVE
Ketones, ur: NEGATIVE mg/dL
Leukocytes, UA: NEGATIVE
Nitrite: NEGATIVE
Protein, ur: NEGATIVE mg/dL
SPECIFIC GRAVITY, URINE: 1.012 (ref 1.005–1.030)
pH: 7 (ref 5.0–8.0)

## 2018-01-27 LAB — WET PREP, GENITAL
SPERM: NONE SEEN
Trich, Wet Prep: NONE SEEN
YEAST WET PREP: NONE SEEN

## 2018-01-27 MED ORDER — METRONIDAZOLE 500 MG PO TABS: 500.0000 mg | ORAL_TABLET | Freq: Two times a day (BID) | ORAL | 0 refills | Status: DC
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
## 2018-01-27 NOTE — ED Triage Notes (Signed)
Pt states she has yeast infection. Pt reports itching and discharge.

## 2018-01-27 NOTE — Discharge Instructions (Signed)
Take Flagyl as prescribed, do not drink alcohol while taking Flagyl. Follow up with your doctor for recheck and management of your blood pressure. Return to ER for worsening or concerning symptoms.

## 2018-01-28 LAB — GC/CHLAMYDIA PROBE AMP (~~LOC~~) NOT AT ARMC
Chlamydia: NEGATIVE
Neisseria Gonorrhea: NEGATIVE

## 2018-02-09 ENCOUNTER — Emergency Department (HOSPITAL_COMMUNITY): Payer: Medicare HMO

## 2018-02-09 ENCOUNTER — Other Ambulatory Visit: Payer: Self-pay

## 2018-02-09 ENCOUNTER — Emergency Department (HOSPITAL_COMMUNITY)
Admission: EM | Admit: 2018-02-09 | Discharge: 2018-02-09 | Disposition: A | Discharge status: Home / Self Care | Payer: Medicare HMO | Attending: Emergency Medicine | Admitting: Emergency Medicine

## 2018-02-09 ENCOUNTER — Encounter (HOSPITAL_COMMUNITY): Payer: Self-pay

## 2018-02-09 DIAGNOSIS — I2510 Atherosclerotic heart disease of native coronary artery without angina pectoris: Secondary | ICD-10-CM | POA: Insufficient documentation

## 2018-02-09 DIAGNOSIS — Z87891 Personal history of nicotine dependence: Secondary | ICD-10-CM | POA: Insufficient documentation

## 2018-02-09 DIAGNOSIS — I129 Hypertensive chronic kidney disease with stage 1 through stage 4 chronic kidney disease, or unspecified chronic kidney disease: Secondary | ICD-10-CM | POA: Insufficient documentation

## 2018-02-09 DIAGNOSIS — N182 Chronic kidney disease, stage 2 (mild): Secondary | ICD-10-CM | POA: Insufficient documentation

## 2018-02-09 DIAGNOSIS — R102 Pelvic and perineal pain: Secondary | ICD-10-CM | POA: Diagnosis not present

## 2018-02-09 DIAGNOSIS — Z79899 Other long term (current) drug therapy: Secondary | ICD-10-CM | POA: Insufficient documentation

## 2018-02-09 DIAGNOSIS — I10 Essential (primary) hypertension: Secondary | ICD-10-CM

## 2018-02-09 LAB — COMPREHENSIVE METABOLIC PANEL
ALT: 16 U/L (ref 0–44)
AST: 17 U/L (ref 15–41)
Albumin: 3.7 g/dL (ref 3.5–5.0)
Alkaline Phosphatase: 76 U/L (ref 38–126)
Anion gap: 9 (ref 5–15)
BUN: 17 mg/dL (ref 8–23)
CHLORIDE: 104 mmol/L (ref 98–111)
CO2: 25 mmol/L (ref 22–32)
CREATININE: 1.23 mg/dL — AB (ref 0.44–1.00)
Calcium: 9.5 mg/dL (ref 8.9–10.3)
GFR, EST AFRICAN AMERICAN: 53 mL/min — AB (ref >60)
GFR, EST NON AFRICAN AMERICAN: 46 mL/min — AB (ref >60)
Glucose, Bld: 100 mg/dL — ABNORMAL HIGH (ref 70–99)
POTASSIUM: 3.7 mmol/L (ref 3.5–5.1)
Sodium: 138 mmol/L (ref 135–145)
Total Bilirubin: 0.4 mg/dL (ref 0.3–1.2)
Total Protein: 7.6 g/dL (ref 6.5–8.1)

## 2018-02-09 LAB — CBC WITH DIFFERENTIAL/PLATELET
Abs Immature Granulocytes: 0.01 10*3/uL (ref 0.00–0.07)
BASOS PCT: 0 %
Basophils Absolute: 0 10*3/uL (ref 0.0–0.1)
EOS ABS: 0.1 10*3/uL (ref 0.0–0.5)
Eosinophils Relative: 2 %
HCT: 44.3 % (ref 36.0–46.0)
Hemoglobin: 14 g/dL (ref 12.0–15.0)
Immature Granulocytes: 0 %
Lymphocytes Relative: 44 %
Lymphs Abs: 2.4 10*3/uL (ref 0.7–4.0)
MCH: 29.7 pg (ref 26.0–34.0)
MCHC: 31.6 g/dL (ref 30.0–36.0)
MCV: 94.1 fL (ref 80.0–100.0)
MONOS PCT: 6 %
Monocytes Absolute: 0.3 10*3/uL (ref 0.1–1.0)
NEUTROS PCT: 48 %
Neutro Abs: 2.6 10*3/uL (ref 1.7–7.7)
PLATELETS: 298 10*3/uL (ref 150–400)
RBC: 4.71 MIL/uL (ref 3.87–5.11)
RDW: 12.9 % (ref 11.5–15.5)
WBC: 5.4 10*3/uL (ref 4.0–10.5)
nRBC: 0 % (ref 0.0–0.2)

## 2018-02-09 LAB — URINALYSIS, ROUTINE W REFLEX MICROSCOPIC
Bilirubin Urine: NEGATIVE
GLUCOSE, UA: NEGATIVE mg/dL
Hgb urine dipstick: NEGATIVE
KETONES UR: NEGATIVE mg/dL
Nitrite: NEGATIVE
PH: 6 (ref 5.0–8.0)
Protein, ur: 100 mg/dL — AB
Specific Gravity, Urine: 1.015 (ref 1.005–1.030)

## 2018-02-09 LAB — I-STAT BETA HCG BLOOD, ED (MC, WL, AP ONLY): I-stat hCG, quantitative: <5 m[IU]/mL (ref <5)

## 2018-02-09 LAB — WET PREP, GENITAL
Clue Cells Wet Prep HPF POC: NONE SEEN
Sperm: NONE SEEN
Trich, Wet Prep: NONE SEEN
WBC, Wet Prep HPF POC: NONE SEEN
Yeast Wet Prep HPF POC: NONE SEEN

## 2018-02-09 LAB — LIPASE, BLOOD: LIPASE: 40 U/L (ref 11–51)

## 2018-02-09 MED ORDER — HYDRALAZINE HCL 50 MG PO TABS
100.0000 mg | ORAL_TABLET | Freq: Once | ORAL | Status: AC
  Administered 2018-02-09: 100 mg via ORAL
  Filled 2018-02-09: qty 2

## 2018-02-09 MED ORDER — ACETAMINOPHEN 325 MG PO TABS
650.0000 mg | ORAL_TABLET | Freq: Once | ORAL | Status: AC
  Administered 2018-02-09: 650 mg via ORAL
  Filled 2018-02-09: qty 2

## 2018-02-09 MED ORDER — CEPHALEXIN 500 MG PO CAPS: 500.0000 mg | ORAL_CAPSULE | Freq: Three times a day (TID) | ORAL | 0 refills | Status: DC

## 2018-02-09 NOTE — ED Triage Notes (Signed)
Pt c/o lower abd cramping. Pt describes the pain as sharp stabbing pain. Pt denies n/v/d.

## 2018-02-09 NOTE — ED Notes (Signed)
Pt transported to US

## 2018-02-09 NOTE — ED Notes (Signed)
US at bedside

## 2018-02-09 NOTE — ED Provider Notes (Signed)
Planes of lower abdominal pain for the past several days.  She denies change in appetite she is presently hungry.  She ate pizza last night.  She admits to vaginal discharge for several months.  She thinks she may have a yeast infection.  She also complains of a mild headache gradual in onset this morning.  On exam patient is alert Glasgow Coma Score 15 appears in no distress HEENT exam no facial asymmetry neck supple lungs clear to auscultation abdomen obese, minimally tender at the supra pubic area.  No right lower quadrant tenderness.  No right upper quadrant tenderness.   Doug SouJacubowitz, Arn Mcomber, MD 02/09/18 [phone]
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
## 2018-02-09 NOTE — ED Notes (Signed)
Bed: WA01 Expected date:  Expected time:  Means of arrival:  Comments: 

## 2018-02-09 NOTE — Discharge Instructions (Addendum)
You are seen in the ER for left sided pelvic pain.  Urine shows some signs of infection, given your symptoms we will treat you for UTI.  You can take Tylenol for pain.  Return for fevers, worsening pain, pain to your flank, nausea, vomiting, diarrhea, blood in your stools.  Your blood pressure was noted to be elevated today.  Make sure you are taking all your blood pressure medicines as prescribed by  your doctor. You need to call your doctor on Monday and make an appointment for further discussion of your blood pressure.  It looks like it is always high and not well controlled.  You need to get your blood pressure rechecked within a week.  Return to the ER for severe sudden headache, vision changes, chest pain, shortness of breath, severe back or abdominal pain, numbness weakness or drooping to one side of your body.

## 2018-02-11 ENCOUNTER — Telehealth: Payer: Self-pay | Admitting: *Deleted

## 2018-02-11 LAB — URINE CULTURE

## 2018-02-11 LAB — GC/CHLAMYDIA PROBE AMP (~~LOC~~) NOT AT ARMC
CHLAMYDIA, DNA PROBE: NEGATIVE
NEISSERIA GONORRHEA: NEGATIVE

## 2018-02-11 NOTE — Telephone Encounter (Signed)
RN called patient informed  Her that  The cardiovascular  Section has been filled out  And can be picked up  -- patient will need to take to her other doctocrs to fill out their  Sections.  patient a states she will pick up form today  ( placed to be scanned)

## 2018-02-12 ENCOUNTER — Telehealth: Payer: Self-pay | Admitting: Emergency Medicine

## 2018-02-12 NOTE — Telephone Encounter (Signed)
Post ED Visit - Positive Culture Follow-up: Successful Patient Follow-Up  Culture assessed and recommendations reviewed by:  []  Enzo BiNathan Batchelder, Pharm.D. []  Celedonio MiyamotoJeremy Frens, Pharm.D., BCPS AQ-ID []  Garvin FilaMike Maccia, Pharm.D., BCPS []  Georgina PillionElizabeth Martin, Pharm.D., BCPS []  DrummondMinh Pham, VermontPharm.D., BCPS, AAHIVP []  Estella HuskMichelle Turner, Pharm.D., BCPS, AAHIVP [x]  Lysle Pearlachel Rumbarger, PharmD, BCPS []  Phillips Climeshuy Dang, PharmD, BCPS []  Agapito GamesAlison Masters, PharmD, BCPS []  Verlan FriendsErin Deja, PharmD  Positive urine culture  []  Patient discharged without antimicrobial prescription and treatment is now indicated [x]  Organism is resistant to prescribed ED discharge antimicrobial []  Patient with positive blood cultures  Changes discussed with ED provider: Eyvonne MechanicJeffrey Hedges PA New antibiotic prescription symptom check,d/c Keflex, start Macrobid 100mg  po bid x 5 days [facility] [address] [phone]  Contacted patient, 02/12/2018 1010   Berle MullMiller, Ryanne Morand 02/12/2018, 10:10 AM

## 2018-02-12 NOTE — Progress Notes (Signed)
ED Antimicrobial Stewardship Positive Culture Follow Up   Erin Riley is an 67 y.o. female who presented to [facility] on 02/09/2018 with a chief complaint of  Chief Complaint  Patient presents with  . Abdominal Cramping    Recent Results (from the past 720 hour(s))  Wet prep, genital     Status: Abnormal   Collection Time: 01/20/18 10:07 AM  Result Value Ref Range Status   Yeast Wet Prep HPF POC NONE SEEN NONE SEEN Final   Trich, Wet Prep NONE SEEN NONE SEEN Final   Clue Cells Wet Prep HPF POC NONE SEEN NONE SEEN Final   WBC, Wet Prep HPF POC FEW (A) NONE SEEN Final    Comment: MODERATE BACTERIA SEEN   Sperm NONE SEEN  Final    Comment: Performed at [facility], [address] [place], [place] [address]  Urine culture     Status: Abnormal   Collection Time: 01/20/18 11:11 AM  Result Value Ref Range Status   Specimen Description   Final    URINE, RANDOM Performed at [facility], [address], [place], [place] [address]    Special Requests   Final    Normal Performed at [facility], [address], [place], [place] [address]    Culture >=100,000 COLONIES/mL ESCHERICHIA COLI (A)  Final   Report Status 01/22/2018 FINAL  Final   Organism ID, Bacteria ESCHERICHIA COLI (A)  Final      Susceptibility   Escherichia coli - MIC*    AMPICILLIN >=32 RESISTANT Resistant     CEFAZOLIN >=64 RESISTANT Resistant     CEFTRIAXONE <=1 SENSITIVE Sensitive     CIPROFLOXACIN <=0.25 SENSITIVE Sensitive     GENTAMICIN <=1 SENSITIVE Sensitive     IMIPENEM <=0.25 SENSITIVE Sensitive     NITROFURANTOIN <=16 SENSITIVE Sensitive     TRIMETH/SULFA <=20 SENSITIVE Sensitive     AMPICILLIN/SULBACTAM >=32 RESISTANT Resistant     Extended ESBL NEGATIVE Sensitive     * >=100,000 COLONIES/mL ESCHERICHIA COLI  Wet prep, genital     Status: Abnormal   Collection Time: 01/27/18 12:01 PM  Result Value Ref Range Status   Yeast Wet Prep HPF POC NONE SEEN NONE SEEN Final   Trich, Wet Prep NONE  SEEN NONE SEEN Final   Clue Cells Wet Prep HPF POC PRESENT (A) NONE SEEN Final   WBC, Wet Prep HPF POC FEW (A) NONE SEEN Final   Sperm NONE SEEN  Final    Comment: Performed at [facility], [address], [place], [place] [address]  Urine culture     Status: Abnormal   Collection Time: 02/09/18 12:39 PM  Result Value Ref Range Status   Specimen Description   Final    URINE, CLEAN CATCH Performed at [facility], [address] [place], [place] [address]    Special Requests   Final    NONE Performed at [facility], [address], [place], [place] [address]    Culture 10,000 COLONIES/mL STAPHYLOCOCCUS EPIDERMIDIS (A)  Final   Report Status 02/11/2018 FINAL  Final   Organism ID, Bacteria STAPHYLOCOCCUS EPIDERMIDIS (A)  Final      Susceptibility   Staphylococcus epidermidis - MIC*    CIPROFLOXACIN >=8 RESISTANT Resistant     GENTAMICIN <=0.5 SENSITIVE Sensitive     NITROFURANTOIN <=16 SENSITIVE Sensitive     OXACILLIN >=4 RESISTANT Resistant     TETRACYCLINE <=1 SENSITIVE Sensitive     VANCOMYCIN 1  SENSITIVE Sensitive     TRIMETH/SULFA 160 RESISTANT Resistant     CLINDAMYCIN <=0.25 SENSITIVE Sensitive     RIFAMPIN <=0.5 SENSITIVE Sensitive     Inducible Clindamycin NEGATIVE Sensitive     * 10,000 COLONIES/mL STAPHYLOCOCCUS EPIDERMIDIS  Wet prep, genital     Status: None   Collection Time: 02/09/18  3:27 PM  Result Value Ref Range Status   Yeast Wet Prep HPF POC NONE SEEN NONE SEEN Final   Trich, Wet Prep NONE SEEN NONE SEEN Final   Clue Cells Wet Prep HPF POC NONE SEEN NONE SEEN Final   WBC, Wet Prep HPF POC NONE SEEN NONE SEEN Final   Sperm NONE SEEN  Final    Comment: Performed at [facility], [address] [place], [place] [id number]    [x]  Treated with cephalexin, organism resistant to prescribed antimicrobial []  Patient discharged originally without antimicrobial agent and treatment is now  indicated  New antibiotic prescription: If pt with worsening symptoms, DC cephalexin and start macrobid 100mg  PO BID x 5 days  ED Provider: Alveria ApleySophia Caccavale, PA   Aleigh Grunden, Drake LeachRachel Lynn 02/12/2018, 8:47 AM Clinical Pharmacist Monday - Friday phone -  [phone] Saturday - Sunday phone - [phone]

## 2018-02-26 ENCOUNTER — Encounter (HOSPITAL_COMMUNITY): Payer: Self-pay

## 2018-02-26 ENCOUNTER — Inpatient Hospital Stay (HOSPITAL_COMMUNITY)
Admission: AD | Admit: 2018-02-26 | Discharge: 2018-02-26 | Disposition: A | Discharge status: Home / Self Care | Payer: Medicare HMO | Source: Ambulatory Visit | Attending: Obstetrics and Gynecology | Admitting: Obstetrics and Gynecology

## 2018-02-26 DIAGNOSIS — I129 Hypertensive chronic kidney disease with stage 1 through stage 4 chronic kidney disease, or unspecified chronic kidney disease: Secondary | ICD-10-CM | POA: Diagnosis not present

## 2018-02-26 DIAGNOSIS — I10 Essential (primary) hypertension: Secondary | ICD-10-CM

## 2018-02-26 DIAGNOSIS — Z87891 Personal history of nicotine dependence: Secondary | ICD-10-CM | POA: Insufficient documentation

## 2018-02-26 DIAGNOSIS — N898 Other specified noninflammatory disorders of vagina: Secondary | ICD-10-CM | POA: Insufficient documentation

## 2018-02-26 DIAGNOSIS — N182 Chronic kidney disease, stage 2 (mild): Secondary | ICD-10-CM | POA: Diagnosis not present

## 2018-02-26 DIAGNOSIS — L293 Anogenital pruritus, unspecified: Secondary | ICD-10-CM | POA: Diagnosis present

## 2018-02-26 LAB — URINALYSIS, COMPLETE (UACMP) WITH MICROSCOPIC
BACTERIA UA: NONE SEEN
Bilirubin Urine: NEGATIVE
Glucose, UA: NEGATIVE mg/dL
Hgb urine dipstick: NEGATIVE
KETONES UR: NEGATIVE mg/dL
Leukocytes, UA: NEGATIVE
Nitrite: NEGATIVE
Protein, ur: 100 mg/dL — AB
Specific Gravity, Urine: 1.019 (ref 1.005–1.030)
pH: 5 (ref 5.0–8.0)

## 2018-02-26 LAB — WET PREP, GENITAL
Clue Cells Wet Prep HPF POC: NONE SEEN
Sperm: NONE SEEN
Trich, Wet Prep: NONE SEEN
Yeast Wet Prep HPF POC: NONE SEEN

## 2018-02-26 MED ORDER — HYDRALAZINE HCL 100 MG PO TABS: 100.0000 mg | ORAL_TABLET | Freq: Three times a day (TID) | ORAL | 3 refills | Status: DC

## 2018-02-26 MED ORDER — ISOSORBIDE MONONITRATE ER 60 MG PO TB24: 90.0000 mg | ORAL_TABLET | Freq: Every day | ORAL | 1 refills | Status: DC

## 2018-02-26 MED ORDER — CARVEDILOL 25 MG PO TABS: 25.0000 mg | ORAL_TABLET | Freq: Two times a day (BID) | ORAL | 1 refills | Status: DC

## 2018-02-26 MED ORDER — HYDRALAZINE HCL 50 MG PO TABS
100.0000 mg | ORAL_TABLET | Freq: Once | ORAL | Status: AC
  Administered 2018-02-26: 100 mg via ORAL
  Filled 2018-02-26: qty 2

## 2018-02-26 MED ORDER — TRIAMTERENE-HCTZ 37.5-25 MG PO TABS: 1.0000 | ORAL_TABLET | Freq: Every day | ORAL | 1 refills | Status: DC

## 2018-02-26 MED ORDER — ACETAMINOPHEN 325 MG PO TABS
650.0000 mg | ORAL_TABLET | Freq: Once | ORAL | Status: AC
  Administered 2018-02-26: 650 mg via ORAL
  Filled 2018-02-26: qty 2

## 2018-02-26 NOTE — MAU Provider Note (Signed)
Patient Erin Riley is a 67 y.o. [id number] Non-pregnant female here with complaints of vaginal itching and discharge. She also has some pain when she urinates. Patient also reports a headache. She drove herself here after she went Christmas shopping.   She said that she took her BP medicine at 10 am but has not taken it since then. She denies all other complaints other than headache and itching. History     CSN: [id number]  Arrival date and time: 02/26/18 1626   First Provider Initiated Contact with Patient 02/26/18 1723      Chief Complaint  Patient presents with  . Vaginal Itching  . Vaginal Discharge   Vaginal Itching  The patient's primary symptoms include vaginal discharge. The current episode started 1 to 4 weeks ago. The problem occurs 2 to 4 times per day. The problem has been unchanged. Pertinent negatives include no constipation or diarrhea. The vaginal discharge was yellow (no odor).  Vaginal Discharge  The patient's primary symptoms include vaginal discharge. Pertinent negatives include no constipation or diarrhea.    OB History    Gravida  3   Para  3   Term  3   Preterm      AB      Living  3     SAB      TAB      Ectopic      Multiple      Live Births  3           Past Medical History:  Diagnosis Date  . Chronic back pain   . Chronic kidney disease (CKD), stage II (mild)    Class I-II  . Coronary artery disease 04/2009   50% stenosis in the perforator of LAD; catheterization was for an abnormal Myoview in January 2000 showing anterior and inferolateral ischemia.  . Diverticulitis   . 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 > 3 subsequent Echos show improved/normal EF with moderate to severe concentric LVH and diastolic dysfunction with LVOT/intracavitary gradient --> 06/2016: Severe LVH.  Vigorous EF, 65-70%.?? Gr 1 DD. Mild AS.  [name]. History of  schizophrenia    However I am not sure about the validity of this. She is not on any medications.  . Hyperlipidemia   . Hypertension   . Hypertensive hypertrophic cardiomyopathy: NYHA class II:  Echo: Severe concentric LVH with LV OT gradient; essentially preserved EF with diastolic dysfunction 02/15/2013   Echo 06/2016: Severe Concentric LVH. Vigorous EF 65-70%. ~ Gr I DD.   . Mild aortic stenosis by prior echocardiography    Echo 06/2016: Mild AS (Mean Gradient 15 mmHg); has had prior Mod-Severe MR (not seen on current echo)  . PAD (peripheral artery disease) [facility](HCC) March 2013   Lower extremity Dopplers: R. SFA 50-60%, R. PTA proximally occluded with distal reconstitution;; L. common iliac ~50%, L. SFA 50-70% stenosis, L. PTA < 50%  . Schizophrenia (HCC)   . Urinary tract infection     Past Surgical History:  Procedure Laterality Date  . BUNIONECTOMY    . carotid doppler  05/29/2011   left bulb/prox ICA moderate amtfibrous plaque with no evidence significant reduction.,right bulb /proximal ICA normal patency  . lower extremity doppler  05/29/2011   right SFA 50% to 59% diameter reduction,right posterior tibal atreery occlusive disease,reconstituting distally, left common illiac<50%,left SFA 50 to70%,left post. tibial <50%  . NM MYOCAR PERF WALL  MOTION  03/2009   Persantine; EF 51%-both anterior and inferolateral ischemia  . TRANSTHORACIC ECHOCARDIOGRAM  06/2016   Severe LVH.  Vigorous EF of 65-70%.  No RWMA. ~Only grade 1 diastolic dysfunction.  Mild aortic stenosis (mean gradient 15 mmHg)  . TRANSTHORACIC ECHOCARDIOGRAM  07/2012   EF 50-55%; severe concentric LVH; only grade 1 diastolic dysfunction. Mild aortic sclerosis - with LVOT /intracavitary gradient of roughly 20 mmHg mean. Mild to moderately dilated LA;; previously reported MR not seen    Family History  Problem Relation Age of Onset  . Hypertension Mother   . Breast cancer Neg Hx     Social History   Tobacco Use  .  Smoking status: Former Smoker    Types: Cigarettes    Last attempt to quit: 05/14/2002    Years since quitting: 15.8  . Smokeless tobacco: Never Used  Substance Use Topics  . Alcohol use: No  . Drug use: No    Allergies: No Known Allergies  Medications Prior to Admission  Medication Sig Dispense Refill Last Dose  . cephALEXin (KEFLEX) 500 MG capsule Take 1 capsule (500 mg total) by mouth 3 (three) times daily. 20 capsule 0   . hydrALAZINE (APRESOLINE) 100 MG tablet Take 1 tablet (100 mg total) by mouth 3 (three) times daily. For high blood pressure 270 tablet 3 02/09/2018 at Unknown time  . isosorbide mononitrate (IMDUR) 60 MG 24 hr tablet Take 90 mg by mouth daily.   02/09/2018 at Unknown time  . metroNIDAZOLE (FLAGYL) 500 MG tablet Take 1 tablet (500 mg total) by mouth 2 (two) times daily. 14 tablet 0 02/09/2018 at Unknown time    Review of Systems  Constitutional: Negative.   HENT: Negative.   Respiratory: Negative.   Gastrointestinal: Negative.  Negative for constipation and diarrhea.  Genitourinary: Positive for vaginal discharge.  Hematological: Negative.   Psychiatric/Behavioral: Negative.    Physical Exam   Blood pressure (!) 217/117, pulse 81, temperature 98.5 F (36.9 C), resp. rate 20, weight 115.2 kg, last menstrual period 05/10/2013, SpO2 97 %.  Patient Vitals for the past 24 hrs:  BP Temp Pulse Resp SpO2 Weight  02/26/18 1830 (!) 171/85 - 99 - - -  02/26/18 1757 (!) 135/100 - 86 - - -  02/26/18 1652 (!) 217/117 - 81 - 97 % -  02/26/18 1645 (!) 197/131 98.5 F (36.9 C) 90 20 98 % 115.2 kg     Physical Exam  HENT:  Head: Normocephalic.  Eyes: Pupils are equal, round, and reactive to light.  Neck: Normal range of motion.  Respiratory: Effort normal.  GI: Soft.  Genitourinary:    Genitourinary Comments: NEFG: normal external female genitalia; no discharge or blood in the vagina. No CMT, suprapubic or adnexal tenderness.    Neurological: She is alert.  Skin:  Skin is warm.    MAU Course  Procedures  MDM -wet prep negative -gc chlamydia pending -UA appears negative, no leuks or nitrites. Patient states that she has completed her medicine for UTI; appears to have treated her UTI.  -BP is elevated, however, is within normal range for patient.  She was given 100 mg of apresoline as patient missed her afternoon dose.  E[facility]; EKG shows  Normal sinus rythmn with sinus arrthymia, possible left atrial enlargement, left ventricular hypertrophy and non-specific T-wave abnormality.   -Patient is alert and oriented times 3; she desires discharge.   Assessment and Plan   1. Essential hypertension  2. Patient stable for discharge; reviewed in detail with the patient the importance of taking her medications as prescribed.   3. Patient is still alert and oriented times 3; verbalized understanding of her medications.   4. Msg sent to Dr. Herbie Baltimore to have patient come in for follow-up as multiple ED visits show that patient has had her medicines changed multiple times and her hypertension is uncontrolled.   5. Emphasized to patient that she needs to go to [facility] ED ASAP if she develops any severe  headaches, chest pain, blurry vision, fatigue, SOB  Or other signs of heart attack or stroke.   Charlesetta Garibaldi Kooistra 02/26/2018, 5:25 PM

## 2018-02-26 NOTE — MAU Note (Signed)
Urine in lab 

## 2018-02-26 NOTE — MAU Note (Signed)
Pt having vaginal itching and burning with discharge. No pain

## 2018-02-28 LAB — GC/CHLAMYDIA PROBE AMP (~~LOC~~) NOT AT ARMC
Chlamydia: NEGATIVE
Neisseria Gonorrhea: NEGATIVE

## 2018-03-05 ENCOUNTER — Emergency Department (HOSPITAL_COMMUNITY)
Admission: EM | Admit: 2018-03-05 | Discharge: 2018-03-05 | Disposition: A | Discharge status: Home / Self Care | Payer: Medicare HMO | Attending: Emergency Medicine | Admitting: Emergency Medicine

## 2018-03-05 ENCOUNTER — Emergency Department (HOSPITAL_COMMUNITY): Payer: Medicare HMO

## 2018-03-05 ENCOUNTER — Other Ambulatory Visit: Payer: Self-pay

## 2018-03-05 DIAGNOSIS — N182 Chronic kidney disease, stage 2 (mild): Secondary | ICD-10-CM | POA: Insufficient documentation

## 2018-03-05 DIAGNOSIS — Z87891 Personal history of nicotine dependence: Secondary | ICD-10-CM | POA: Insufficient documentation

## 2018-03-05 DIAGNOSIS — I129 Hypertensive chronic kidney disease with stage 1 through stage 4 chronic kidney disease, or unspecified chronic kidney disease: Secondary | ICD-10-CM | POA: Insufficient documentation

## 2018-03-05 DIAGNOSIS — Z79899 Other long term (current) drug therapy: Secondary | ICD-10-CM | POA: Insufficient documentation

## 2018-03-05 DIAGNOSIS — G8929 Other chronic pain: Secondary | ICD-10-CM | POA: Diagnosis not present

## 2018-03-05 DIAGNOSIS — I2510 Atherosclerotic heart disease of native coronary artery without angina pectoris: Secondary | ICD-10-CM | POA: Insufficient documentation

## 2018-03-05 DIAGNOSIS — I10 Essential (primary) hypertension: Secondary | ICD-10-CM

## 2018-03-05 DIAGNOSIS — M545 Low back pain: Secondary | ICD-10-CM | POA: Diagnosis not present

## 2018-03-05 DIAGNOSIS — F209 Schizophrenia, unspecified: Secondary | ICD-10-CM | POA: Diagnosis not present

## 2018-03-05 LAB — URINALYSIS, ROUTINE W REFLEX MICROSCOPIC
Bacteria, UA: NONE SEEN
Bilirubin Urine: NEGATIVE
Glucose, UA: NEGATIVE mg/dL
Hgb urine dipstick: NEGATIVE
Ketones, ur: NEGATIVE mg/dL
Leukocytes, UA: NEGATIVE
Nitrite: NEGATIVE
PROTEIN: 30 mg/dL — AB
Specific Gravity, Urine: 1.014 (ref 1.005–1.030)
pH: 6 (ref 5.0–8.0)

## 2018-03-05 LAB — PREGNANCY, URINE: Preg Test, Ur: NEGATIVE

## 2018-03-05 MED ORDER — HYDRALAZINE HCL 25 MG PO TABS
100.0000 mg | ORAL_TABLET | Freq: Once | ORAL | Status: AC
  Administered 2018-03-05: 100 mg via ORAL
  Filled 2018-03-05: qty 4
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
## 2018-03-05 NOTE — ED Triage Notes (Signed)
Pt. Stated, I way over due for my period, and my back hurts.

## 2018-03-05 NOTE — BH Assessment (Signed)
Tele Assessment Note   Patient Name: Erin Riley MRN: [id number] Referring Physician: Charyl Bigger Location of Patient: [facility] Location of Provider: Behavioral Health TTS Department  Erin Riley is an 67 y.o. female presents to [facility] alone voluntarily. Pt reports of back pain and reporting she thinks she is late and pregnant and/or that she has just given birth to triplets. Per hx pt is post menopausal and has not had a period since 2015. Pt lives with her son. Pt denies SI currently or at any time in the past. Pt denies any history of suicide attempts and denies history of self-mutilation. Pt denies homicidal thoughts or physical aggression. Pt denies having access to firearms. Pt denies having any legal problems at this time. Pt denies AH/VH. Per chart pt has hx of schizophrenia diagnosis. Pt denies hx of inpatient or outpatient services. Pt denies any current or past substance abuse problems. Pt does not appear to be intoxicated or in withdrawal at this time.   Pt is dressed in scrubs, alert, oriented x4 with normal speech and normal motor behavior. Eye contact is fair and Pt is calm. Pt's mood is flat and affect is congruent.  Pt's insight is poor and judgement is fair. Pt was cooperative throughout assessment.    Diagnosis: F22 Delusional disorder  Past Medical History:  Past Medical History:  Diagnosis Date  . Chronic back pain   . Chronic kidney disease (CKD), stage II (mild)    Class I-II  . Coronary artery disease 04/2009   50% stenosis in the perforator of LAD; catheterization was for an abnormal Myoview in January 2000 showing anterior and inferolateral ischemia.  . Diverticulitis   . 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 > 3 subsequent Echos show improved/normal EF with moderate to severe concentric LVH and diastolic dysfunction with LVOT/intracavitary gradient --> 06/2016: Severe LVH.   Vigorous EF, 65-70%.?? Gr 1 DD. Mild AS.  [name] History of schizophrenia    However I am not sure about the validity of this. She is not on any medications.  . Hyperlipidemia   . Hypertension   . Hypertensive hypertrophic cardiomyopathy: NYHA class II:  Echo: Severe concentric LVH with LV OT gradient; essentially preserved EF with diastolic dysfunction 02/15/2013   Echo 06/2016: Severe Concentric LVH. Vigorous EF 65-70%. ~ Gr I DD.   . Mild aortic stenosis by prior echocardiography    Echo 06/2016: Mild AS (Mean Gradient 15 mmHg); has had prior Mod-Severe MR (not seen on current echo)  . PAD (peripheral artery disease) [facility]) March 2013   Lower extremity Dopplers: R. SFA 50-60%, R. PTA proximally occluded with distal reconstitution;; L. common iliac ~50%, L. SFA 50-70% stenosis, L. PTA < 50%  . Schizophrenia (HCC)   . Urinary tract infection     Past Surgical History:  Procedure Laterality Date  . BUNIONECTOMY    . carotid doppler  05/29/2011   left bulb/prox ICA moderate amtfibrous plaque with no evidence significant reduction.,right bulb /proximal ICA normal patency  . lower extremity doppler  05/29/2011   right SFA 50% to 59% diameter reduction,right posterior tibal atreery occlusive disease,reconstituting distally, left common illiac<50%,left SFA 50 to70%,left post. tibial <50%  . NM MYOCAR PERF WALL MOTION  03/2009   Persantine; EF 51%-both anterior and inferolateral ischemia  . TRANSTHORACIC ECHOCARDIOGRAM  06/2016   Severe LVH.  Vigorous EF of 65-70%.  No RWMA. ~Only grade 1 diastolic  dysfunction.  Mild aortic stenosis (mean gradient 15 mmHg)  . TRANSTHORACIC ECHOCARDIOGRAM  07/2012   EF 50-55%; severe concentric LVH; only grade 1 diastolic dysfunction. Mild aortic sclerosis - with LVOT /intracavitary gradient of roughly 20 mmHg mean. Mild to moderately dilated LA;; previously reported MR not seen    Family History:  Family History  Problem Relation Age of Onset  . Hypertension  Mother   . Breast cancer Neg Hx     Social History:  reports that she quit smoking about 15 years ago. Her smoking use included cigarettes. She has never used smokeless tobacco. She reports that she does not drink alcohol or use drugs.  Additional Social History:  Alcohol / Drug Use Pain Medications: See MAR Prescriptions: See MAR Over the Counter: See MAR History of alcohol / drug use?: No history of alcohol / drug abuse Longest period of sobriety (when/how long): N/A  CIWA: CIWA-Ar BP: (!) 190/100 Pulse Rate: 85 COWS:    Allergies: No Known Allergies  Home Medications: (Not in a hospital admission)   OB/GYN Status:  Patient's last menstrual period was 05/10/2013.  General Assessment Data [facility] Assessment: In system Is this a Tele or Face-to-Face Assessment?: Tele Assessment Is this an Initial Assessment or a Re-assessment for this encounter?: Initial Assessment Language Other than English: No What gender do you identify as?: Female Marital status: Widowed Pregnancy Status: No Living Arrangements: Children Can pt return to current living arrangement?: Yes Admission Status: Voluntary Is patient capable of signing voluntary admission?: Yes Referral Source: Self/Family/Friend     Crisis Care Plan Living Arrangements: Children Name of Psychiatrist: None Name of Therapist: None  Education Status Is patient currently in school?: No  Risk to self with the past 6 months Suicidal Ideation: No Has patient been a risk to self within the past 6 months prior to admission? : No Suicidal Intent: No Has patient had any suicidal intent within the past 6 months prior to admission? : No Is patient at risk for suicide?: No Suicidal Plan?: No Has patient had any suicidal plan within the past 6 months prior to admission? : No Access to Means: No What has been your use of drugs/alcohol within the last 12 months?: None Previous Attempts/Gestures:  No Intentional Self Injurious Behavior: None Family Suicide History: No Persecutory voices/beliefs?: No Depression: No Substance abuse history and/or treatment for substance abuse?: No Suicide prevention information given to non-admitted patients: Not applicable  Risk to Others within the past 6 months Homicidal Ideation: No Does patient have any lifetime risk of violence toward others beyond the six months prior to admission? : No Thoughts of Harm to Others: No Current Homicidal Intent: No Current Homicidal Plan: No Access to Homicidal Means: No History of harm to others?: No Assessment of Violence: None Noted Does patient have access to weapons?: No Criminal Charges Pending?: No Does patient have a court date: No Is patient on probation?: No  Psychosis Hallucinations: None noted Delusions: Grandiose(Pt believes she could be pregnant and/or just hsd triplets)  Mental Status Report Appearance/Hygiene: Unremarkable Eye Contact: Fair Motor Activity: Freedom of movement Speech: Logical/coherent Level of Consciousness: Quiet/awake Mood: Sullen Affect: Blunted, Flat Anxiety Level: None Thought Processes: Thought Blocking Judgement: Partial Orientation: Person, Place, Time, Situation, Appropriate for developmental age Obsessive Compulsive Thoughts/Behaviors: None  Cognitive Functioning Concentration: Normal Memory: Recent Intact Is patient IDD: No Insight: Poor Impulse Control: Poor Appetite: Good Have you had any weight changes? : No Change Sleep: No  Change Total Hours of Sleep: 8 Vegetative Symptoms: None  ADLScreening [facility] Assessment Services) Patient's cognitive ability adequate to safely complete daily activities?: Yes Patient able to express need for assistance with ADLs?: Yes Independently performs ADLs?: Yes (appropriate for developmental age)  Prior Inpatient Therapy Prior Inpatient Therapy: No  Prior Outpatient Therapy Prior Outpatient Therapy:  No Does patient have an ACCT team?: No Does patient have Intensive In-House Services?  : No Does patient have [facility] services? : No Does patient have P4CC services?: No  ADL Screening (condition at time of admission) Patient's cognitive ability adequate to safely complete daily activities?: Yes Is the patient deaf or have difficulty hearing?: No Does the patient have difficulty seeing, even when wearing glasses/contacts?: No Does the patient have difficulty concentrating, remembering, or making decisions?: No Patient able to express need for assistance with ADLs?: Yes Does the patient have difficulty dressing or bathing?: No Independently performs ADLs?: Yes (appropriate for developmental age) Does the patient have difficulty walking or climbing stairs?: No Weakness of Legs: None Weakness of Arms/Hands: None  Home Assistive Devices/Equipment Home Assistive Devices/Equipment: None  Therapy Consults (therapy consults require a physician order) PT Evaluation Needed: No OT Evalulation Needed: No SLP Evaluation Needed: No Abuse/Neglect Assessment (Assessment to be complete while patient is alone) Abuse/Neglect Assessment Can Be Completed: Yes Physical Abuse: Denies Verbal Abuse: Denies Sexual Abuse: Denies Exploitation of patient/patient's resources: Denies Self-Neglect: Denies Values / Beliefs Cultural Requests During Hospitalization: None Spiritual Requests During Hospitalization: None Consults Spiritual Care Consult Needed: No Social Work Consult Needed: No Merchant navy officerAdvance Directives (For Healthcare) Does Patient Have a Medical Advance Directive?: No Would patient like information on creating a medical advance directive?: No - Patient declined          Disposition:  Disposition Initial Assessment Completed for this Encounter: Yes Disposition of Patient: Discharge   Per Assunta FoundShuvon Rankin, NP pt does not meet inpatient criteria.  This service was provided via telemedicine using  a 2-way, interactive audio and video technology.  Names of all persons participating in this telemedicine service and their role in this encounter. Name: Gilford RaidMae Gabler Role: Pt  Name: Danae OrleansVanessa Bari Leib, [place], [place] Role: Therapeutic Triage Specialist  Name: Assunta FoundShuvon Rankin, NP Role: Provider  Name:  Role:     Danae OrleansVanessa  Jobany Montellano, [place], [facility] 03/05/2018 6:26 PM

## 2018-03-05 NOTE — Discharge Instructions (Addendum)
Evaluated today for low back pain.  Your x-ray is negative.  You did have elevated blood pressure in department.  Please follow-up with your PCP for reevaluation.

## 2018-03-12 ENCOUNTER — Telehealth: Payer: Self-pay | Admitting: *Deleted

## 2018-03-12 NOTE — Telephone Encounter (Signed)
Called unable to leave message to call back - voice mail not set up - need to make follow up appointment per  Message - to see blood pressure  And medication complaince

## 2018-03-12 NOTE — Telephone Encounter (Signed)
-----   Message from Marykay Lex, MD sent at 03/04/2018  5:17 PM EST ----- Regarding: RE: Macie Goodine OK - will see if we can get her in with me or APP.  Bryan Lemma, MD ----- Message ----- From: Marylene Land, CNM Sent: 02/26/2018   7:50 PM EST To: Marykay Lex, MD Subject: [name]                                      Hi Dr. Herbie Baltimore, This is Luna Kitchens, a midwife at [facility]. [name] was here for a gyn complaint; her blood pressure was elevated and we treated her with hydralazine and I put her back on coreg, apresoline, maxide and imdur. She has had several ED visits since she saw you in October and many of her meds have been D/C. The attending and I here decided to restart her on the meds she was on, based on her visit with you in October. Wanted to let you know she probably needs a follow up and hoped your office could schedule.   Thank you so much!  Samara Deist

## 2018-03-31 ENCOUNTER — Encounter (HOSPITAL_COMMUNITY): Payer: Self-pay

## 2018-03-31 ENCOUNTER — Observation Stay (HOSPITAL_COMMUNITY)
Admission: EM | Admit: 2018-03-31 | Discharge: 2018-04-01 | Disposition: A | Discharge status: Home / Self Care | Payer: Medicare Other | Attending: Family Medicine | Admitting: Family Medicine

## 2018-03-31 ENCOUNTER — Other Ambulatory Visit: Payer: Self-pay

## 2018-03-31 ENCOUNTER — Emergency Department (HOSPITAL_COMMUNITY): Payer: Medicare Other

## 2018-03-31 DIAGNOSIS — I739 Peripheral vascular disease, unspecified: Secondary | ICD-10-CM | POA: Diagnosis not present

## 2018-03-31 DIAGNOSIS — I1310 Hypertensive heart and chronic kidney disease without heart failure, with stage 1 through stage 4 chronic kidney disease, or unspecified chronic kidney disease: Secondary | ICD-10-CM | POA: Insufficient documentation

## 2018-03-31 DIAGNOSIS — E785 Hyperlipidemia, unspecified: Secondary | ICD-10-CM | POA: Diagnosis not present

## 2018-03-31 DIAGNOSIS — R1010 Upper abdominal pain, unspecified: Principal | ICD-10-CM | POA: Insufficient documentation

## 2018-03-31 DIAGNOSIS — Z6832 Body mass index (BMI) 32.0-32.9, adult: Secondary | ICD-10-CM | POA: Insufficient documentation

## 2018-03-31 DIAGNOSIS — Z87891 Personal history of nicotine dependence: Secondary | ICD-10-CM | POA: Diagnosis not present

## 2018-03-31 DIAGNOSIS — E669 Obesity, unspecified: Secondary | ICD-10-CM | POA: Diagnosis not present

## 2018-03-31 DIAGNOSIS — K5909 Other constipation: Secondary | ICD-10-CM | POA: Insufficient documentation

## 2018-03-31 DIAGNOSIS — I2510 Atherosclerotic heart disease of native coronary artery without angina pectoris: Secondary | ICD-10-CM | POA: Insufficient documentation

## 2018-03-31 DIAGNOSIS — R778 Other specified abnormalities of plasma proteins: Secondary | ICD-10-CM

## 2018-03-31 DIAGNOSIS — R109 Unspecified abdominal pain: Secondary | ICD-10-CM | POA: Diagnosis present

## 2018-03-31 DIAGNOSIS — R7989 Other specified abnormal findings of blood chemistry: Secondary | ICD-10-CM | POA: Insufficient documentation

## 2018-03-31 DIAGNOSIS — F200 Paranoid schizophrenia: Secondary | ICD-10-CM | POA: Insufficient documentation

## 2018-03-31 DIAGNOSIS — Z7982 Long term (current) use of aspirin: Secondary | ICD-10-CM | POA: Insufficient documentation

## 2018-03-31 DIAGNOSIS — Z79899 Other long term (current) drug therapy: Secondary | ICD-10-CM | POA: Diagnosis not present

## 2018-03-31 DIAGNOSIS — R1013 Epigastric pain: Secondary | ICD-10-CM

## 2018-03-31 DIAGNOSIS — I255 Ischemic cardiomyopathy: Secondary | ICD-10-CM | POA: Insufficient documentation

## 2018-03-31 DIAGNOSIS — N183 Chronic kidney disease, stage 3 (moderate): Secondary | ICD-10-CM | POA: Insufficient documentation

## 2018-03-31 DIAGNOSIS — Z9114 Patient's other noncompliance with medication regimen: Secondary | ICD-10-CM | POA: Diagnosis not present

## 2018-03-31 DIAGNOSIS — R9431 Abnormal electrocardiogram [ECG] [EKG]: Secondary | ICD-10-CM | POA: Insufficient documentation

## 2018-03-31 DIAGNOSIS — Z8249 Family history of ischemic heart disease and other diseases of the circulatory system: Secondary | ICD-10-CM | POA: Diagnosis not present

## 2018-03-31 LAB — CBC
HCT: 40.2 % (ref 36.0–46.0)
HEMOGLOBIN: 12.7 g/dL (ref 12.0–15.0)
MCH: 30.8 pg (ref 26.0–34.0)
MCHC: 31.6 g/dL (ref 30.0–36.0)
MCV: 97.6 fL (ref 80.0–100.0)
Platelets: 272 10*3/uL (ref 150–400)
RBC: 4.12 MIL/uL (ref 3.87–5.11)
RDW: 13.5 % (ref 11.5–15.5)
WBC: 5.9 10*3/uL (ref 4.0–10.5)
nRBC: 0 % (ref 0.0–0.2)

## 2018-03-31 LAB — TROPONIN I: Troponin I: 0.03 ng/mL (ref <0.03)

## 2018-03-31 LAB — COMPREHENSIVE METABOLIC PANEL
ALK PHOS: 71 U/L (ref 38–126)
ALT: 14 U/L (ref 0–44)
AST: 17 U/L (ref 15–41)
Albumin: 3.5 g/dL (ref 3.5–5.0)
Anion gap: 6 (ref 5–15)
BUN: 19 mg/dL (ref 8–23)
CO2: 28 mmol/L (ref 22–32)
Calcium: 9.1 mg/dL (ref 8.9–10.3)
Chloride: 107 mmol/L (ref 98–111)
Creatinine, Ser: 1.09 mg/dL — ABNORMAL HIGH (ref 0.44–1.00)
GFR calc Af Amer: >60 mL/min (ref >60)
GFR calc non Af Amer: 53 mL/min — ABNORMAL LOW (ref >60)
Glucose, Bld: 130 mg/dL — ABNORMAL HIGH (ref 70–99)
POTASSIUM: 3.8 mmol/L (ref 3.5–5.1)
SODIUM: 141 mmol/L (ref 135–145)
Total Bilirubin: 0.8 mg/dL (ref 0.3–1.2)
Total Protein: 6.9 g/dL (ref 6.5–8.1)

## 2018-03-31 LAB — LIPASE, BLOOD: Lipase: 43 U/L (ref 11–51)

## 2018-03-31 MED ORDER — MORPHINE SULFATE (PF) 4 MG/ML IV SOLN
4.0000 mg | Freq: Once | INTRAVENOUS | Status: AC
  Administered 2018-03-31: 4 mg via INTRAVENOUS
  Filled 2018-03-31: qty 1

## 2018-03-31 MED ORDER — ALUM & MAG HYDROXIDE-SIMETH 200-200-20 MG/5ML PO SUSP
30.0000 mL | Freq: Once | ORAL | Status: AC
  Administered 2018-03-31: 30 mL via ORAL
  Filled 2018-03-31: qty 30

## 2018-03-31 MED ORDER — ALUM & MAG HYDROXIDE-SIMETH 200-200-20 MG/5ML PO SUSP: 30.0000 mL | Freq: Four times a day (QID) | ORAL | Status: DC | PRN

## 2018-03-31 MED ORDER — ONDANSETRON HCL 4 MG/2ML IJ SOLN: 4.0000 mg | Freq: Four times a day (QID) | INTRAMUSCULAR | Status: DC | PRN

## 2018-03-31 MED ORDER — SODIUM CHLORIDE 0.9% FLUSH
3.0000 mL | Freq: Once | INTRAVENOUS | Status: AC
  Administered 2018-03-31: 3 mL via INTRAVENOUS

## 2018-03-31 MED ORDER — TRIAMTERENE-HCTZ 37.5-25 MG PO TABS
1.0000 | ORAL_TABLET | Freq: Every day | ORAL | Status: DC
  Administered 2018-03-31 – 2018-04-01 (×2): 1 via ORAL
  Filled 2018-03-31 (×2): qty 1

## 2018-03-31 MED ORDER — ISOSORBIDE MONONITRATE ER 60 MG PO TB24
90.0000 mg | ORAL_TABLET | Freq: Every day | ORAL | Status: DC
  Administered 2018-03-31: 90 mg via ORAL
  Filled 2018-03-31: qty 1

## 2018-03-31 MED ORDER — HYDRALAZINE HCL 50 MG PO TABS: 100.0000 mg | ORAL_TABLET | Freq: Once | ORAL | Status: DC

## 2018-03-31 MED ORDER — METOCLOPRAMIDE HCL 10 MG PO TABS: 10.0000 mg | ORAL_TABLET | Freq: Four times a day (QID) | ORAL | Status: DC | PRN

## 2018-03-31 MED ORDER — FAMOTIDINE 20 MG PO TABS
20.0000 mg | ORAL_TABLET | Freq: Every day | ORAL | Status: DC
  Administered 2018-03-31: 20 mg via ORAL
  Filled 2018-03-31: qty 1

## 2018-03-31 MED ORDER — HYDRALAZINE HCL 100 MG PO TABS: 100.0000 mg | ORAL_TABLET | Freq: Three times a day (TID) | ORAL | Status: DC

## 2018-03-31 MED ORDER — PANTOPRAZOLE SODIUM 40 MG PO TBEC
40.0000 mg | DELAYED_RELEASE_TABLET | Freq: Every day | ORAL | Status: DC
  Administered 2018-04-01: 40 mg via ORAL
  Filled 2018-03-31: qty 1

## 2018-03-31 MED ORDER — HYDRALAZINE HCL 20 MG/ML IJ SOLN
10.0000 mg | Freq: Once | INTRAMUSCULAR | Status: AC
  Administered 2018-03-31: 10 mg via INTRAVENOUS
  Filled 2018-03-31: qty 1

## 2018-03-31 MED ORDER — ASPIRIN EC 81 MG PO TBEC
81.0000 mg | DELAYED_RELEASE_TABLET | Freq: Every day | ORAL | Status: DC
  Administered 2018-04-01: 81 mg via ORAL
  Filled 2018-03-31: qty 1

## 2018-03-31 MED ORDER — ENOXAPARIN SODIUM 40 MG/0.4ML ~~LOC~~ SOLN: 40.0000 mg | SUBCUTANEOUS | Status: DC

## 2018-03-31 MED ORDER — ONDANSETRON HCL 4 MG/2ML IJ SOLN
4.0000 mg | Freq: Once | INTRAMUSCULAR | Status: AC
  Administered 2018-03-31: 4 mg via INTRAVENOUS
  Filled 2018-03-31: qty 2

## 2018-03-31 MED ORDER — ATORVASTATIN CALCIUM 40 MG PO TABS: 40.0000 mg | ORAL_TABLET | Freq: Every day | ORAL | Status: DC

## 2018-03-31 MED ORDER — CARVEDILOL 25 MG PO TABS
25.0000 mg | ORAL_TABLET | Freq: Two times a day (BID) | ORAL | Status: DC
  Administered 2018-03-31 – 2018-04-01 (×2): 25 mg via ORAL
  Filled 2018-03-31 (×3): qty 1

## 2018-03-31 NOTE — H&P (Signed)
History and Physical  TAMELIA SILVERIO RNH:[id number] DOB: [birth year] DOA: 03/31/2018 1503  Referring physician: Archie Endo [facility] ED) PCP: Magdalene River, PA-C  Outpatient Specialists: Mitzi Hansen (OBGYN); Ranae Palms (cardiology)  HISTORY   Chief Complaint: abdominal pain  HPI: Erin Riley is a 68 y.o. female with LVH, resolved NICM (now with preserved EF), CKD stage 2, non obstructive CAD, HTN, schizophrenia who presented to [facility] ED with acute sharp recurrent abdominal pain. Patient reports that early in AM, felt sharp "wire poking" sensation in upper mid abdomen, in a band like distribution. Somewhat positional as pain seemed to worsen more with upright position. Patient states that this pain has been recurrent and occurs on different parts of her upper to mid abdomen. +associated mild nausea, without vomiting. Cannot identify clear triggers. Reports chronic constipation; last BM reported earlier today was normal in color and quantity (no diarrhea or bloody stools). Denies alleviating factors. Denies eating raw meats/seafood or recent travel. No fevers/chills.  Review of Systems:  + abdominal pain (recurrent); +mild nausea - no fevers/chills - no cough - no chest pain, dyspnea on exertion - no edema, PND, orthopnea - no tarry, melanotic or bloody stools - no dysuria, increased urinary frequency - no weight changes  Rest of systems reviewed are negative, except as per above history.   ED course:  Vitals Blood pressure (!) 201/116, pulse 83, temperature 98 F (36.7 C), temperature source Oral, resp. rate 20, height 5\' 8"  (1.727 m), weight 97.5 kg, last menstrual period 05/10/2013, SpO2 94 %. Received maalox 63ml x1; morphine 4mg  IV x 1; zofran 4mg  IV x 1;   Past Medical History:  Diagnosis Date  . Chronic back pain   . Chronic kidney disease (CKD), stage II (mild)    Class I-II  . Coronary artery disease 04/2009   50% stenosis in the perforator of LAD; catheterization was for an abnormal  Myoview in January 2000 showing anterior and inferolateral ischemia.  . Diverticulitis   . 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 > 3 subsequent Echos show improved/normal EF with moderate to severe concentric LVH and diastolic dysfunction with LVOT/intracavitary gradient --> 06/2016: Severe LVH.  Vigorous EF, 65-70%.?? Gr 1 DD. Mild AS.  [name] History of schizophrenia    However I am not sure about the validity of this. She is not on any medications.  . Hyperlipidemia   . Hypertension   . Hypertensive hypertrophic cardiomyopathy: NYHA class II:  Echo: Severe concentric LVH with LV OT gradient; essentially preserved EF with diastolic dysfunction 02/15/2013   Echo 06/2016: Severe Concentric LVH. Vigorous EF 65-70%. ~ Gr I DD.   . Mild aortic stenosis by prior echocardiography    Echo 06/2016: Mild AS (Mean Gradient 15 mmHg); has had prior Mod-Severe MR (not seen on current echo)  . PAD (peripheral artery disease) [facility]) March 2013   Lower extremity Dopplers: R. SFA 50-60%, R. PTA proximally occluded with distal reconstitution;; L. common iliac ~50%, L. SFA 50-70% stenosis, L. PTA < 50%  . Schizophrenia (HCC)   . Urinary tract infection    Past Surgical History:  Procedure Laterality Date  . BUNIONECTOMY    . carotid doppler  05/29/2011   left bulb/prox ICA moderate amtfibrous plaque with no evidence significant reduction.,right bulb /proximal ICA normal patency  . lower extremity doppler  05/29/2011   right SFA 50% to 59% diameter reduction,right  posterior tibal atreery occlusive disease,reconstituting distally, left common illiac<50%,left SFA 50 to70%,left post. tibial <50%  . NM MYOCAR PERF WALL MOTION  03/2009   Persantine; EF 51%-both anterior and inferolateral ischemia  . TRANSTHORACIC ECHOCARDIOGRAM  06/2016   Severe LVH.  Vigorous EF of 65-70%.  No RWMA. ~Only grade 1 diastolic dysfunction.   Mild aortic stenosis (mean gradient 15 mmHg)  . TRANSTHORACIC ECHOCARDIOGRAM  07/2012   EF 50-55%; severe concentric LVH; only grade 1 diastolic dysfunction. Mild aortic sclerosis - with LVOT /intracavitary gradient of roughly 20 mmHg mean. Mild to moderately dilated LA;; previously reported MR not seen    Social History:  reports that she quit smoking about 15 years ago. Her smoking use included cigarettes. She has never used smokeless tobacco. She reports that she does not drink alcohol or use drugs.  No Known Allergies  Family History  Problem Relation Age of Onset  . Hypertension Mother   . Breast cancer Neg Hx       Prior to Admission medications   Medication Sig Start Date End Date Taking? Authorizing Provider  aspirin EC 81 MG tablet Take 81 mg by mouth daily.   Yes [provider]  atorvastatin (LIPITOR) 40 MG tablet Take 40 mg by mouth daily.   Yes [provider]  carvedilol (COREG) 25 MG tablet Take 1 tablet (25 mg total) by mouth 2 (two) times daily with a meal. 02/26/18  Yes Kooistra, Charlesetta GaribaldiKathryn Lorraine, CNM  hydrALAZINE (APRESOLINE) 100 MG tablet Take 1 tablet (100 mg total) by mouth 3 (three) times daily. For high blood pressure Patient not taking: Reported on 03/31/2018 02/26/18   Marylene LandKooistra, Kathryn Lorraine, CNM  isosorbide mononitrate (IMDUR) 60 MG 24 hr tablet Take 1.5 tablets (90 mg total) by mouth daily. Patient not taking: Reported on 03/31/2018 02/26/18   Marylene LandKooistra, Kathryn Lorraine, CNM  metroNIDAZOLE (FLAGYL) 500 MG tablet Take 1 tablet (500 mg total) by mouth 2 (two) times daily. Patient not taking: Reported on 03/31/2018 01/27/18   Jeannie FendMurphy, Laura A, PA-C  triamterene-hydrochlorothiazide (MAXZIDE-25) 37.5-25 MG tablet Take 1 tablet by mouth daily. Patient not taking: Reported on 03/31/2018 02/26/18   Marylene LandKooistra, Kathryn Lorraine, CNM    PHYSICAL EXAM   Temp:  [98 F (36.7 C)] 98 F (36.7 C) (01/26 1623) Pulse Rate:  [70-83] 83 (01/26 1930) Resp:   [16-20] 20 (01/26 1930) BP: (155-201)/(107-116) 201/116 (01/26 1930) SpO2:  [94 %-97 %] 94 % (01/26 1930) Weight:  [97.5 kg] 97.5 kg (01/26 1417)  BP (!) 201/116   Pulse 83   Temp 98 F (36.7 C) (Oral)   Resp 20   Ht 5\' 8"  (1.727 m)   Wt 97.5 kg   LMP 05/10/2013   SpO2 94%   BMI 32.69 kg/m    GEN obese middle-aged african-american female; resting in bed comfortably  HEENT NCAT EOM intact PERRL; clear oropharynx, no cervical LAD; moist mucus membranes  JVP estimated 5 cm H2O above RA; no HJR ; no carotid bruits b/l ;  CV regular normal rate; normal S1 and S2; 2/6 cresc-decr systolic murmur at USB; PMI diffuse; no parasternal heave  RESP CTA b/l; breathing unlabored and symmetric  ABD soft NT ND +hyperactive BS throughout;  EXT warm throughout b/l; no peripheral edema b/l  PULSES  DP and radials 2+ intact b/l  SKIN/MSK no rashes or lesions  NEURO/PSYCH AAOx4; no focal deficits + moderate tangentiality; denies AH/VH;    DATA   LABS ON ADMISSION:  Basic Metabolic Panel:  Recent Labs  Lab 03/31/18 1604  NA 141  K 3.8  CL 107  CO2 28  GLUCOSE 130*  BUN 19  CREATININE 1.09*  CALCIUM 9.1   CBC: Recent Labs  Lab 03/31/18 1604  WBC 5.9  HGB 12.7  HCT 40.2  MCV 97.6  PLT 272   Liver Function Tests: Recent Labs  Lab 03/31/18 1604  AST 17  ALT 14  ALKPHOS 71  BILITOT 0.8  PROT 6.9  ALBUMIN 3.5   Recent Labs  Lab 03/31/18 1604  LIPASE 43   No results for input(s): AMMONIA in the last 168 hours. Coagulation:  Lab Results  Component Value Date   INR 1.07 09/16/2014   INR 1.10 04/09/2009   No results found for: PTT Lactic Acid, Venous:  No results found for: LATICACIDVEN Cardiac Enzymes: Recent Labs  Lab 03/31/18 1605  TROPONINI 0.03*   Urinalysis:    Component Value Date/Time   COLORURINE YELLOW 03/05/2018 1318   APPEARANCEUR CLEAR 03/05/2018 1318   APPEARANCEUR Clear 08/31/2017 1043   LABSPEC 1.014 03/05/2018 1318   PHURINE 6.0 03/05/2018  1318   GLUCOSEU NEGATIVE 03/05/2018 1318   HGBUR NEGATIVE 03/05/2018 1318   BILIRUBINUR NEGATIVE 03/05/2018 1318   BILIRUBINUR Negative 08/31/2017 1043   KETONESUR NEGATIVE 03/05/2018 1318   PROTEINUR 30 (A) 03/05/2018 1318   UROBILINOGEN 0.2 01/15/2017 0812   NITRITE NEGATIVE 03/05/2018 1318   LEUKOCYTESUR NEGATIVE 03/05/2018 1318   LEUKOCYTESUR Negative 08/31/2017 1043    BNP (last 3 results) No results for input(s): PROBNP in the last 8760 hours. CBG: No results for input(s): GLUCAP in the last 168 hours.  Radiological Exams on Admission: Dg Chest 2 View  Result Date: 03/31/2018 CLINICAL DATA:  History of hypertrophic cardiomyopathy. Patient does not feel well. EXAM: CHEST - 2 VIEW COMPARISON:  01/09/2018 FINDINGS: Mildly increased cardiac silhouette.  Tortuosity of the aorta. There is no evidence of focal airspace consolidation, pleural effusion or pneumothorax. Osseous structures are without acute abnormality. Soft tissues are grossly normal. IMPRESSION: Mildly increased cardiac silhouette. Electronically Signed   By: Ted Mcalpineobrinka  Dimitrova M.D.   On: 03/31/2018 17:56   [place] Abdomen Limited  Result Date: 03/31/2018 CLINICAL DATA:  Upper abdominal pain EXAM: ULTRASOUND ABDOMEN LIMITED RIGHT UPPER QUADRANT COMPARISON:  03/21/2017 FINDINGS: Gallbladder: No gallstones or wall thickening visualized. No sonographic Murphy sign noted by sonographer. Common bile duct: Diameter: 4.7 mm. Liver: No focal lesion identified. Within normal limits in parenchymal echogenicity. Portal vein is patent on color Doppler imaging with normal direction of blood flow towards the liver. IMPRESSION: No acute abnormality noted. Electronically Signed   By: Alcide CleverMark  Lukens M.D.   On: 03/31/2018 17:26    EKG: Independently reviewed. NSR with anterolateral TWI likely due to LVH with repolarization changes (no new changes from prior EKG)    I have reviewed the patient's previous electronic chart records, labs, and other  data.   ASSESSMENT AND PLAN   Assessment: Erin Riley is a 68 y.o. female with LVH, resolved NICM (now with preserved EF), CKD stage 2, non obstructive CAD, HTN, schizophrenia who presents with recurrent sharp abdominal pain. Suspect gastritis, constipation, or bowel gas as culprit. Abdom pain actually resolved after maalox while in ED. However exam is notable for HTN to 180-200 systolics and detectable troponin at 0.03. No chest pain or EKG changes. Patient is reportedly not taking several of her anti-HTN/cardiac meds, which may explain uncontrolled pressures. Cardiac ischemic process highly unlikely at this time, but will recheck  troponins and EKG overnight. Will empirically treat for reflux as well.    Active Problems:   Abdominal pain   Plan:   # Upper abdominal pain, likely gastritis vs gas related > prior CT abd/pelvis in 03/2017 showed possible mild sigmoid divertiulitis - start PPI and trial pepcid tonight - maalox prn abdom sx - zofran IV prn nausea - if abdom pain recurs, low threshold for CT abd/pelvis  # Mild troponin elevation at 0.03, likely demand. No chest pain or EKG changes (stable LVH changes)  > known LVH, G1DD, preserved EF, mild AS (TTE 06/2016) - BP control as below - repeat troponins overnight and in AM - repeat EKG in AM - telemetry  # HTNsive urgency with uncontrolled BP in setting of med non compliance > BP 180-200s systolics in ED - hydralazine 10mg  IV x 1 overnight - restart home coreg 25mg  BID - resume home imdur 90mg  qd tonight (pt not taking at home) - resume traimterene-HCTZ tonight (pt not taking at home) - if BP still elevated after 6 hours, resume hydral 100mg  TID - spot dose IV hydral for sBP > 180s  # Hx of nonobstructive CAD (50% in perforator of LAD) - resume home asa 81mg  and atorvastatin - trop and EKG check as above  # Hx of ?schizophrena > pt currently not on psych meds; no evidence of acute psychosis currently, although notably  tangential on my exam - follow up as outpatient  # CKD stage 2 - at baseline Cr 1.09  - monitor with daily BMP, follow up as outpatient    DVT Prophylaxis: lovenox Code Status:  Full Code Family Communication: discussed with pt at bedside  Disposition Plan: observation on telemetry; dispo pending repeat cardiac labs/EKG   Patient contact: Extended Emergency Contact Information Primary Emergency Contact: Hill,[place]          Jacky Kindle [place] Home Phone: [phone] Mobile Phone: [phone] Relation: Son  Time spent: > 35 mins  Ike Bene, MD [facility] Pager [phone]  If 7PM-7AM, please contact night-coverage [email or web address] Password [facility]erson [facility] 03/31/2018, 8:09 PM

## 2018-03-31 NOTE — ED Notes (Addendum)
Pt stated that she cannot urinate at this time.
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
## 2018-03-31 NOTE — ED Notes (Signed)
Date and time results received: 03/31/18 1702 (use smartphrase ".now" to insert current time)  Test: troponin Critical Value: 0.03  Name of Provider Notified: Fayrene Helper  Orders Received? Or Actions Taken?: reported troponin 0.03 to Fayrene Helper.

## 2018-03-31 NOTE — ED Triage Notes (Signed)
Pt states that she has lower abd pain that just started. Denies N/V/D.

## 2018-03-31 NOTE — ED Notes (Signed)
ED TO INPATIENT HANDOFF REPORT  Name/Age/Gender Erin Riley 68 y.o. female  Code Status    Code Status Orders  (From admission, onward)         Start     Ordered   03/31/18 1902  Full code  Continuous     03/31/18 1903        Code Status History    Date Active Date Inactive Code Status Order ID Comments User Context   09/14/2017 2155 09/15/2017 1509 Full Code [id number]  Melene Plan, DO ED   03/31/2017 2149 04/05/2017 2131 Full Code [id number]  Delila Pereyra, NP Inpatient   03/29/2017 1331 03/31/2017 2131 Full Code [id number]  Marily Memos, MD ED   08/14/2016 1138 08/15/2016 1950 Full Code [id number]  Cathren Laine, MD ED   06/09/2016 0251 06/09/2016 2205 Full Code [id number]  Gilda Crease, MD ED   03/25/2015 1501 03/26/2015 1750 Full Code [id number]  Cheri Fowler, PA-C ED   11/28/2014 1443 11/30/2014 1508 Full Code [id number]  Lonia Blood, MD Inpatient   09/24/2014 1351 09/28/2014 2036 Full Code [id number]  Tia Alert, MD Inpatient   07/31/2012 2259 08/02/2012 1823 Full Code [id number]  Doree Albee, MD ED      Home/SNF/Other Home  Chief Complaint abdominal pain  Level of Care/Admitting Diagnosis ED Disposition    ED Disposition Condition Comment   Admit  Hospital Area: [facility] [[id number]]  Level of Care: Telemetry [5]  Admit to tele based on following criteria: Monitor for Ischemic changes  Diagnosis: Abdominal pain [607371]  Admitting Physician: Ike Bene [[id number]]  Attending Physician: Ike Bene [[id number]]  PT Class (Do Not Modify): Observation [104]  PT Acc Code (Do Not Modify): Observation [10022]       Medical History Past Medical History:  Diagnosis Date  . Chronic back pain   . Chronic kidney disease (CKD), stage II (mild)    Class I-II  . Coronary artery disease 04/2009   50% stenosis in the perforator of LAD; catheterization was for an abnormal Myoview in January 2000 showing anterior and inferolateral ischemia.  .  Diverticulitis   . 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 > 3 subsequent Echos show improved/normal EF with moderate to severe concentric LVH and diastolic dysfunction with LVOT/intracavitary gradient --> 06/2016: Severe LVH.  Vigorous EF, 65-70%.?? Gr 1 DD. Mild AS.  [name] History of schizophrenia    However I am not sure about the validity of this. She is not on any medications.  . Hyperlipidemia   . Hypertension   . Hypertensive hypertrophic cardiomyopathy: NYHA class II:  Echo: Severe concentric LVH with LV OT gradient; essentially preserved EF with diastolic dysfunction 02/15/2013   Echo 06/2016: Severe Concentric LVH. Vigorous EF 65-70%. ~ Gr I DD.   . Mild aortic stenosis by prior echocardiography    Echo 06/2016: Mild AS (Mean Gradient 15 mmHg); has had prior Mod-Severe MR (not seen on current echo)  . PAD (peripheral artery disease) [facility]) March 2013   Lower extremity Dopplers: R. SFA 50-60%, R. PTA proximally occluded with distal reconstitution;; L. common iliac ~50%, L. SFA 50-70% stenosis, L. PTA < 50%  . Schizophrenia (HCC)   . Urinary tract infection     Allergies No Known Allergies  IV Location/Drains/Wounds Patient Lines/Drains/Airways Status   Active Line/Drains/Airways    Name:   Placement date:   Placement time:  Site:   Days:   Peripheral IV 03/31/18 Right Antecubital   03/31/18    1601    Antecubital   less than 1          Labs/Imaging Results for orders placed or performed during the hospital encounter of 03/31/18 (from the past 48 hour(s))  Lipase, blood     Status: None   Collection Time: 03/31/18  4:04 PM  Result Value Ref Range   Lipase 43 11 - 51 U/L    Comment: Performed at [facility], [address] [place], [place] [address]  Comprehensive metabolic panel     Status: Abnormal   Collection Time: 03/31/18  4:04 PM  Result Value Ref Range    Sodium 141 135 - 145 mmol/L   Potassium 3.8 3.5 - 5.1 mmol/L   Chloride 107 98 - 111 mmol/L   CO2 28 22 - 32 mmol/L   Glucose, Bld 130 (H) 70 - 99 mg/dL   BUN 19 8 - 23 mg/dL   Creatinine, Ser 6.571.09 (H) 0.44 - 1.00 mg/dL   Calcium 9.1 8.9 - 84.610.3 mg/dL   Total Protein 6.9 6.5 - 8.1 g/dL   Albumin 3.5 3.5 - 5.0 g/dL   AST 17 15 - 41 U/L   ALT 14 0 - 44 U/L   Alkaline Phosphatase 71 38 - 126 U/L   Total Bilirubin 0.8 0.3 - 1.2 mg/dL   GFR calc non Af Amer 53 (L) >60 mL/min   GFR calc Af Amer >60 >60 mL/min   Anion gap 6 5 - 15    Comment: Performed at [facility], [address] [place], [place] [address]  CBC     Status: None   Collection Time: 03/31/18  4:04 PM  Result Value Ref Range   WBC 5.9 4.0 - 10.5 K/uL   RBC 4.12 3.87 - 5.11 MIL/uL   Hemoglobin 12.7 12.0 - 15.0 g/dL   HCT 28.440.2 13.236.0 - 44.046.0 %   MCV 97.6 80.0 - 100.0 fL   MCH 30.8 26.0 - 34.0 pg   MCHC 31.6 30.0 - 36.0 g/dL   RDW 10.213.5 72.511.5 - 36.615.5 %   Platelets 272 150 - 400 K/uL   nRBC 0.0 0.0 - 0.2 %    Comment: Performed at [facility], [address] [place], [place] [address]  Troponin I - ONCE - STAT     Status: Abnormal   Collection Time: 03/31/18  4:05 PM  Result Value Ref Range   Troponin I 0.03 (HH) <0.03 ng/mL    Comment: CRITICAL RESULT CALLED TO, READ BACK BY AND VERIFIED WITH: JESSEE,B. RN AT 1701 03/31/18 MULLINS,T Performed at [facility], [address] [place], [place] [address]    Dg Chest 2 View  Result Date: 03/31/2018 CLINICAL DATA:  History of hypertrophic cardiomyopathy. Patient does not feel well. EXAM: CHEST - 2 VIEW COMPARISON:  01/09/2018 FINDINGS: Mildly increased cardiac silhouette.  Tortuosity of the aorta. There is no evidence of focal airspace consolidation, pleural effusion or pneumothorax. Osseous structures are without acute abnormality. Soft tissues are grossly normal. IMPRESSION: Mildly increased cardiac silhouette.  Electronically Signed   By: Ted Mcalpineobrinka  Dimitrova M.D.   On: 03/31/2018 17:56   [place] Abdomen Limited  Result Date: 03/31/2018 CLINICAL DATA:  Upper abdominal pain EXAM: ULTRASOUND ABDOMEN LIMITED RIGHT UPPER QUADRANT COMPARISON:  03/21/2017 FINDINGS: Gallbladder: No gallstones or wall thickening visualized. No sonographic Murphy sign noted by sonographer. Common bile  duct: Diameter: 4.7 mm. Liver: No focal lesion identified. Within normal limits in parenchymal echogenicity. Portal vein is patent on color Doppler imaging with normal direction of blood flow towards the liver. IMPRESSION: No acute abnormality noted. Electronically Signed   By: Alcide Clever M.D.   On: 03/31/2018 17:26   EKG Interpretation  Date/Time:  Sunday March 31 2018 16:14:13 EST Ventricular Rate:  74 PR Interval:    QRS Duration: 96 QT Interval:  414 QTC Calculation: 460 R Axis:   39 Text Interpretation:  Sinus rhythm Abnormal R-wave progression, early transition Probable LVH with secondary repol abnrm similar to Dec 2019 Confirmed by Goldston, Scott ([id number]) on 03/31/2018 4:25:44 PM Also confirmed by Goldston, Scott ([id number]), editor Brewer, Glenn ([id number])  on 03/31/2018 4:45:38 PM   Pending Labs Unresulted Labs (From admission, onward)    Start     Ordered   04/01/18 0500  Basic metabolic panel  Daily,   R     03/31/18 1917   04/01/18 0500  CBC  Daily,   R     03/31/18 1917   04/01/18 0500  Magnesium  Daily,   R     03/31/18 1917   04/01/18 0500  Troponin I - Tomorrow AM 0500  Tomorrow morning,   R     03/31/18 1917   03/31/18 2300  Troponin I - Once-Timed  Once-Timed,   R     03/31/18 1917   03/31/18 1449  Urinalysis, Routine w reflex microscopic  ONCE - STAT,   STAT     01 /26/20 1449          Vitals/Pain Today's Vitals   03/31/18 1930 03/31/18 2021 03/31/18 2100 03/31/18 2130  BP: (!) 201/116 (!) 199/133 (!) 141/51 128/72  Pulse: 83  80 77  Resp: 20  (!) 26 18  Temp:      TempSrc:      SpO2: 94%  97% 95%   Weight:      Height:      PainSc:        Isolation Precautions No active isolations  Medications Medications  carvedilol (COREG) tablet 25 mg (25 mg Oral Given 03/31/18 2017)  isosorbide mononitrate (IMDUR) 24 hr tablet 90 mg (90 mg Oral Given 03/31/18 2017)  triamterene-hydrochlorothiazide (MAXZIDE-25) 37.5-25 MG per tablet 1 tablet (1 tablet Oral Given 03/31/18 2017)  enoxaparin (LOVENOX) injection 40 mg (has no administration in time range)  ondansetron (ZOFRAN) injection 4 mg (has no administration in time range)  pantoprazole (PROTONIX) EC tablet 40 mg (has no administration in time range)  famotidine (PEPCID) tablet 20 mg (has no administration in time range)  aspirin EC tablet 81 mg (has no administration in time range)  atorvastatin (LIPITOR) tablet 40 mg (has no administration in time range)  alum & mag hydroxide-simeth (MAALOX/MYLANTA) 200-200-20 MG/5ML suspension 30 mL (has no administration in time range)  sodium chloride flush (NS) 0.9 % injection 3 mL (3 mLs Intravenous Given 03/31/18 1559)  morphine 4 MG/ML injection 4 mg (4 mg Intravenous Given 03/31/18 1708)  ondansetron (ZOFRAN) injection 4 mg (4 mg Intravenous Given 03/31/18 1708)  alum & mag hydroxide-simeth (MAALOX/MYLANTA) 200-200-20 MG/5ML suspension 30 mL (30 mLs Oral Given 03/31/18 1708)  hydrALAZINE (APRESOLINE) injection 10 mg (10 mg Intravenous Given 03/31/18 2021)    Mobility walks

## 2018-04-01 ENCOUNTER — Other Ambulatory Visit: Payer: Self-pay

## 2018-04-01 DIAGNOSIS — R9431 Abnormal electrocardiogram [ECG] [EKG]: Secondary | ICD-10-CM | POA: Diagnosis not present

## 2018-04-01 DIAGNOSIS — I1310 Hypertensive heart and chronic kidney disease without heart failure, with stage 1 through stage 4 chronic kidney disease, or unspecified chronic kidney disease: Secondary | ICD-10-CM | POA: Diagnosis not present

## 2018-04-01 DIAGNOSIS — R1010 Upper abdominal pain, unspecified: Secondary | ICD-10-CM

## 2018-04-01 DIAGNOSIS — N183 Chronic kidney disease, stage 3 (moderate): Secondary | ICD-10-CM | POA: Diagnosis not present

## 2018-04-01 LAB — URINALYSIS, ROUTINE W REFLEX MICROSCOPIC
Bacteria, UA: NONE SEEN
Bilirubin Urine: NEGATIVE
Glucose, UA: NEGATIVE mg/dL
Hgb urine dipstick: NEGATIVE
Ketones, ur: NEGATIVE mg/dL
Leukocytes, UA: NEGATIVE
Nitrite: NEGATIVE
Protein, ur: 30 mg/dL — AB
Specific Gravity, Urine: 1.014 (ref 1.005–1.030)
pH: 6 (ref 5.0–8.0)

## 2018-04-01 LAB — BASIC METABOLIC PANEL
Anion gap: 7 (ref 5–15)
BUN: 20 mg/dL (ref 8–23)
CO2: 25 mmol/L (ref 22–32)
CREATININE: 1.31 mg/dL — AB (ref 0.44–1.00)
Calcium: 9 mg/dL (ref 8.9–10.3)
Chloride: 104 mmol/L (ref 98–111)
GFR calc Af Amer: 49 mL/min — ABNORMAL LOW (ref >60)
GFR calc non Af Amer: 42 mL/min — ABNORMAL LOW (ref >60)
Glucose, Bld: 119 mg/dL — ABNORMAL HIGH (ref 70–99)
Potassium: 3.8 mmol/L (ref 3.5–5.1)
Sodium: 136 mmol/L (ref 135–145)

## 2018-04-01 LAB — CBC
HCT: 38.2 % (ref 36.0–46.0)
Hemoglobin: 11.8 g/dL — ABNORMAL LOW (ref 12.0–15.0)
MCH: 29.5 pg (ref 26.0–34.0)
MCHC: 30.9 g/dL (ref 30.0–36.0)
MCV: 95.5 fL (ref 80.0–100.0)
PLATELETS: 292 10*3/uL (ref 150–400)
RBC: 4 MIL/uL (ref 3.87–5.11)
RDW: 13.6 % (ref 11.5–15.5)
WBC: 6.1 10*3/uL (ref 4.0–10.5)
nRBC: 0 % (ref 0.0–0.2)

## 2018-04-01 LAB — TROPONIN I
Troponin I: 0.03 ng/mL (ref <0.03)
Troponin I: 0.03 ng/mL (ref <0.03)

## 2018-04-01 LAB — MAGNESIUM: Magnesium: 2.6 mg/dL — ABNORMAL HIGH (ref 1.7–2.4)

## 2018-04-01 MED ORDER — ALUM & MAG HYDROXIDE-SIMETH 200-200-20 MG/5ML PO SUSP: 30.0000 mL | Freq: Four times a day (QID) | ORAL | 0 refills | Status: DC | PRN

## 2018-04-01 MED ORDER — CARVEDILOL 25 MG PO TABS: 25.0000 mg | ORAL_TABLET | Freq: Two times a day (BID) | ORAL | 2 refills | Status: DC

## 2018-04-01 MED ORDER — ISOSORBIDE MONONITRATE ER 60 MG PO TB24: 90.0000 mg | ORAL_TABLET | Freq: Every day | ORAL | 2 refills | Status: DC

## 2018-04-01 MED ORDER — HYDRALAZINE HCL 100 MG PO TABS: 100.0000 mg | ORAL_TABLET | Freq: Three times a day (TID) | ORAL | 2 refills | Status: DC

## 2018-04-01 MED ORDER — FAMOTIDINE 20 MG PO TABS: 20.0000 mg | ORAL_TABLET | Freq: Every day | ORAL | 1 refills | Status: DC

## 2018-04-01 MED ORDER — PANTOPRAZOLE SODIUM 40 MG PO TBEC: 40.0000 mg | DELAYED_RELEASE_TABLET | Freq: Every day | ORAL | 0 refills | Status: DC

## 2018-04-01 NOTE — Plan of Care (Signed)
  Problem: Education: Goal: Knowledge of medication regimen will be met for pain relief regimen by discharge Outcome: Progressing   Problem: Coping: Goal: Ability to verbalize feelings will improve by discharge Outcome: Progressing Goal: Family members realistic understanding of the patients condition will improve by discharge Outcome: Progressing   Problem: Coping: Goal: Family members realistic understanding of the patients condition will improve by discharge Outcome: Progressing   Problem: Activity: Goal: Risk for activity intolerance will decrease Outcome: Progressing   Problem: Coping: Goal: Level of anxiety will decrease Outcome: Progressing   Problem: Physical Regulation: Goal: Will remain free from infection Outcome: Progressing

## 2018-04-01 NOTE — Discharge Summary (Signed)
Physician Discharge Summary  Erin Riley STM:[id number] DOB: Nov 09, 1950 DOA: 03/31/2018  PCP: Benjiman Core D, PA-C  Admit date: 03/31/2018 Discharge date: 04/01/2018  Time spent: 25 minutes  Recommendations for Outpatient Follow-up:  1. Take meds regularly 2. Needs Chem-12 and CBC 1 month 3. Needs refills on meds which were provided  Discharge Diagnoses:  Active Problems:   Abdominal pain   Discharge Condition: Improved  Diet recommendation: Heart healthy  Filed Weights   03/31/18 1417 03/31/18 2219  Weight: 97.5 kg 117.1 kg    History of present illness:  23 68-year-old female with LVH and ICM nonobstructive CAD CKD stage II admitted with chest pain 1/26 She ruled out by troponins She is quite tangential and has flight of ideas She was able to eat without any distress or pain in her belly EKG in the morning was negative She was discharged home with strict instructions to take her medications (note that she has not filled her medications including Imdur in a while)  Discharge Exam: Vitals:   03/31/18 2219 04/01/18 0454  BP: 112/61 137/87  Pulse: 72 70  Resp: 20 20  Temp: 98 F (36.7 C) 98.6 F (37 C)  SpO2: 95% 97%    General: Awake alert pleasant no distress EOMI NCAT thick neck obese African-American female Cardiovascular: S1-S2 no murmur telemetry is benign Respiratory: Chest is clinically clear no added sound neurologically intact with no focal deficits No lower extremity edema  Discharge Instructions   Discharge Instructions    Diet - low sodium heart healthy   Complete by:  As directed    Discharge instructions   Complete by:  As directed    Please refill your medications and take them as appropriate I would recommend you get labs in about 1 week Your chest pain was not because of your heart probably more reflux related   Increase activity slowly   Complete by:  As directed      Allergies as of 04/01/2018   No Known Allergies     Medication  List    STOP taking these medications   metroNIDAZOLE 500 MG tablet Commonly known as:  FLAGYL   triamterene-hydrochlorothiazide 37.5-25 MG tablet Commonly known as:  MAXZIDE-25     TAKE these medications   alum & mag hydroxide-simeth 200-200-20 MG/5ML suspension Commonly known as:  MAALOX/MYLANTA Take 30 mLs by mouth every 6 (six) hours as needed for indigestion or heartburn.   aspirin EC 81 MG tablet Take 81 mg by mouth daily.   atorvastatin 40 MG tablet Commonly known as:  LIPITOR Take 40 mg by mouth daily.   carvedilol 25 MG tablet Commonly known as:  COREG Take 1 tablet (25 mg total) by mouth 2 (two) times daily with a meal.   famotidine 20 MG tablet Commonly known as:  PEPCID Take 1 tablet (20 mg total) by mouth at bedtime.   hydrALAZINE 100 MG tablet Commonly known as:  APRESOLINE Take 1 tablet (100 mg total) by mouth 3 (three) times daily. For high blood pressure   isosorbide mononitrate 60 MG 24 hr tablet Commonly known as:  IMDUR Take 1.5 tablets (90 mg total) by mouth daily.   pantoprazole 40 MG tablet Commonly known as:  PROTONIX Take 1 tablet (40 mg total) by mouth daily. Start taking on:  April 02, 2018      No Known Allergies    The results of significant diagnostics from this hospitalization (including imaging, microbiology, ancillary and laboratory) are listed below for  reference.    Significant Diagnostic Studies: Dg Chest 2 View  Result Date: 03/31/2018 CLINICAL DATA:  History of hypertrophic cardiomyopathy. Patient does not feel well. EXAM: CHEST - 2 VIEW COMPARISON:  01/09/2018 FINDINGS: Mildly increased cardiac silhouette.  Tortuosity of the aorta. There is no evidence of focal airspace consolidation, pleural effusion or pneumothorax. Osseous structures are without acute abnormality. Soft tissues are grossly normal. IMPRESSION: Mildly increased cardiac silhouette. Electronically Signed   By: Ted Mcalpine M.D.   On: 03/31/2018 17:56    Dg Lumbar Spine Complete  Result Date: 03/05/2018 CLINICAL DATA:  68 year old female with midline lumbar spine pain over the past several months. History of prior posterior lumbar interbody fusion in 2016. EXAM: LUMBAR SPINE - COMPLETE 4+ VIEW COMPARISON:  Prior CT scan of the abdomen and pelvis 03/21/2017 FINDINGS: Surgical changes of prior posterior lumbar interbody fusion from L4-S1. No interbody grafts visible. Bilateral pedicle screw and rod construct. No evidence of hardware complication. Advanced facet arthropathy present at L3-L4, L4-L5 and L5-S1. No evidence of fracture or malalignment. IMPRESSION: 1. No evidence of fracture, malalignment or hardware complication. 2. Surgical changes of prior L4-S1 posterior lumbar interbody fusion with bilateral pedicle screw and rod construct. No evidence of interbody grafts or bony ankylosis. 3. Multilevel bilateral facet arthropathy. Electronically Signed   By: Malachy Moan M.D.   On: 03/05/2018 16:04   US Abdomen Limited  Result Date: 03/31/2018 CLINICAL DATA:  Upper abdominal pain EXAM: ULTRASOUND ABDOMEN LIMITED RIGHT UPPER QUADRANT COMPARISON:  03/21/2017 FINDINGS: Gallbladder: No gallstones or wall thickening visualized. No sonographic Murphy sign noted by sonographer. Common bile duct: Diameter: 4.7 mm. Liver: No focal lesion identified. Within normal limits in parenchymal echogenicity. Portal vein is patent on color Doppler imaging with normal direction of blood flow towards the liver. IMPRESSION: No acute abnormality noted. Electronically Signed   By: Alcide Clever M.D.   On: 03/31/2018 17:26    Microbiology: No results found for this or any previous visit (from the past 240 hour(s)).   Labs: Basic Metabolic Panel: Recent Labs  Lab 03/31/18 1604 04/01/18 0535  NA 141 136  K 3.8 3.8  CL 107 104  CO2 28 25  GLUCOSE 130* 119*  BUN 19 20  CREATININE 1.09* 1.31*  CALCIUM 9.1 9.0  MG  --  2.6*   Liver Function Tests: Recent Labs   Lab 03/31/18 1604  AST 17  ALT 14  ALKPHOS 71  BILITOT 0.8  PROT 6.9  ALBUMIN 3.5   Recent Labs  Lab 03/31/18 1604  LIPASE 43   No results for input(s): AMMONIA in the last 168 hours. CBC: Recent Labs  Lab 03/31/18 1604 04/01/18 0535  WBC 5.9 6.1  HGB 12.7 11.8*  HCT 40.2 38.2  MCV 97.6 95.5  PLT 272 292   Cardiac Enzymes: Recent Labs  Lab 03/31/18 1605 03/31/18 2245 04/01/18 0535  TROPONINI 0.03* 0.03* 0.03*   BNP: BNP (last 3 results) Recent Labs    01/09/18 1707  BNP 30.4    ProBNP (last 3 results) No results for input(s): PROBNP in the last 8760 hours.  CBG: No results for input(s): GLUCAP in the last 168 hours.     Signed:  Rhetta Mura MD   [facility] 04/01/2018, 10:51 AM

## 2018-04-01 NOTE — Progress Notes (Signed)
CRITICAL VALUE ALERT  Critical Value:  troponin  Date & Time Notied:  04/01/2018  Provider Notified: notified to hospitalist via Amion  Orders Received/Actions taken: waiting and continue to monitor.

## 2018-04-01 NOTE — Progress Notes (Signed)
Patient given discharge instructions, and verbalized an understanding of all discharge instructions.  Patient agrees with discharge plan, and is being discharged in stable medical condition.  Patient given transportation via wheelchair. 

## 2018-04-01 NOTE — Progress Notes (Signed)
Pt. arrived to the unit around 2213.Pt is A&OX 4 and verbally responsive.Pt.Oriented to the unit ,room and call buttons.Pt.resting comfortably  and continue to monitor.

## 2018-04-12 ENCOUNTER — Other Ambulatory Visit: Payer: Self-pay

## 2018-04-12 ENCOUNTER — Encounter (HOSPITAL_COMMUNITY): Payer: Self-pay

## 2018-04-12 DIAGNOSIS — F209 Schizophrenia, unspecified: Secondary | ICD-10-CM | POA: Insufficient documentation

## 2018-04-12 DIAGNOSIS — I129 Hypertensive chronic kidney disease with stage 1 through stage 4 chronic kidney disease, or unspecified chronic kidney disease: Secondary | ICD-10-CM | POA: Diagnosis not present

## 2018-04-12 DIAGNOSIS — I2510 Atherosclerotic heart disease of native coronary artery without angina pectoris: Secondary | ICD-10-CM | POA: Insufficient documentation

## 2018-04-12 DIAGNOSIS — L299 Pruritus, unspecified: Secondary | ICD-10-CM | POA: Insufficient documentation

## 2018-04-12 DIAGNOSIS — N182 Chronic kidney disease, stage 2 (mild): Secondary | ICD-10-CM | POA: Insufficient documentation

## 2018-04-12 NOTE — ED Triage Notes (Addendum)
Pt reports itching and pain all over her body for the last 2 days. She states that it feels like its in her skin. Denies a rash and nothing seen on her hands or face. Pt is also hypertensive.

## 2018-04-13 ENCOUNTER — Emergency Department (HOSPITAL_COMMUNITY)
Admission: EM | Admit: 2018-04-13 | Discharge: 2018-04-13 | Disposition: A | Discharge status: Home / Self Care | Payer: Medicare Other | Attending: Emergency Medicine | Admitting: Emergency Medicine

## 2018-04-13 DIAGNOSIS — L299 Pruritus, unspecified: Secondary | ICD-10-CM

## 2018-04-13 MED ORDER — DIPHENHYDRAMINE HCL 25 MG PO CAPS
25.0000 mg | ORAL_CAPSULE | Freq: Once | ORAL | Status: DC
Start: 2018-04-13 — End: 2018-04-13

## 2018-04-13 MED ORDER — HYDROXYZINE HCL 25 MG PO TABS
25.0000 mg | ORAL_TABLET | Freq: Once | ORAL | Status: AC
  Administered 2018-04-13: 25 mg via ORAL
  Filled 2018-04-13: qty 1

## 2018-04-13 MED ORDER — PREDNISONE 10 MG PO TABS: 20.0000 mg | ORAL_TABLET | Freq: Two times a day (BID) | ORAL | 0 refills | Status: DC

## 2018-04-13 MED ORDER — PREDNISONE 20 MG PO TABS
20.0000 mg | ORAL_TABLET | Freq: Once | ORAL | Status: AC
  Administered 2018-04-13: 20 mg via ORAL
  Filled 2018-04-13: qty 1

## 2018-04-13 MED ORDER — HYDROXYZINE HCL 25 MG PO TABS: 25.0000 mg | ORAL_TABLET | Freq: Four times a day (QID) | ORAL | 0 refills | Status: DC

## 2018-04-13 MED ORDER — FAMOTIDINE 20 MG PO TABS
20.0000 mg | ORAL_TABLET | Freq: Once | ORAL | Status: DC
Start: 2018-04-13 — End: 2018-04-13
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
## 2018-04-13 NOTE — Discharge Instructions (Addendum)
Prednisone and hydroxyzine as prescribed.  Follow-up with your primary doctor if symptoms or not improving in the next 2 to 3 days.  Also, keep a record of your blood pressures and follow this up with your primary doctor to determine if you need medication adjustments.

## 2018-04-18 ENCOUNTER — Emergency Department (HOSPITAL_COMMUNITY)
Admission: EM | Admit: 2018-04-18 | Discharge: 2018-04-19 | Disposition: A | Discharge status: Other Acute Inpatient Hospital | Payer: Medicare Other | Attending: Emergency Medicine | Admitting: Emergency Medicine

## 2018-04-18 ENCOUNTER — Emergency Department (HOSPITAL_COMMUNITY): Payer: Medicare Other

## 2018-04-18 ENCOUNTER — Other Ambulatory Visit: Payer: Self-pay

## 2018-04-18 ENCOUNTER — Encounter (HOSPITAL_COMMUNITY): Payer: Self-pay | Admitting: Emergency Medicine

## 2018-04-18 DIAGNOSIS — Z79899 Other long term (current) drug therapy: Secondary | ICD-10-CM | POA: Insufficient documentation

## 2018-04-18 DIAGNOSIS — I701 Atherosclerosis of renal artery: Secondary | ICD-10-CM | POA: Diagnosis not present

## 2018-04-18 DIAGNOSIS — F209 Schizophrenia, unspecified: Secondary | ICD-10-CM | POA: Diagnosis not present

## 2018-04-18 DIAGNOSIS — I771 Stricture of artery: Secondary | ICD-10-CM

## 2018-04-18 DIAGNOSIS — Z7982 Long term (current) use of aspirin: Secondary | ICD-10-CM | POA: Diagnosis not present

## 2018-04-18 DIAGNOSIS — Z87891 Personal history of nicotine dependence: Secondary | ICD-10-CM | POA: Diagnosis not present

## 2018-04-18 DIAGNOSIS — R079 Chest pain, unspecified: Secondary | ICD-10-CM | POA: Diagnosis not present

## 2018-04-18 DIAGNOSIS — I774 Celiac artery compression syndrome: Secondary | ICD-10-CM | POA: Diagnosis not present

## 2018-04-18 DIAGNOSIS — N183 Chronic kidney disease, stage 3 (moderate): Secondary | ICD-10-CM | POA: Insufficient documentation

## 2018-04-18 DIAGNOSIS — I129 Hypertensive chronic kidney disease with stage 1 through stage 4 chronic kidney disease, or unspecified chronic kidney disease: Secondary | ICD-10-CM | POA: Insufficient documentation

## 2018-04-18 DIAGNOSIS — I10 Essential (primary) hypertension: Secondary | ICD-10-CM

## 2018-04-18 DIAGNOSIS — F200 Paranoid schizophrenia: Secondary | ICD-10-CM | POA: Diagnosis present

## 2018-04-18 DIAGNOSIS — R4182 Altered mental status, unspecified: Secondary | ICD-10-CM | POA: Diagnosis present

## 2018-04-18 LAB — COMPREHENSIVE METABOLIC PANEL
ALT: 21 U/L (ref 0–44)
AST: 25 U/L (ref 15–41)
Albumin: 4.2 g/dL (ref 3.5–5.0)
Alkaline Phosphatase: 80 U/L (ref 38–126)
Anion gap: 8 (ref 5–15)
BUN: 18 mg/dL (ref 8–23)
CO2: 24 mmol/L (ref 22–32)
Calcium: 9.5 mg/dL (ref 8.9–10.3)
Chloride: 105 mmol/L (ref 98–111)
Creatinine, Ser: 1.27 mg/dL — ABNORMAL HIGH (ref 0.44–1.00)
GFR calc Af Amer: 51 mL/min — ABNORMAL LOW (ref >60)
GFR calc non Af Amer: 44 mL/min — ABNORMAL LOW (ref >60)
GLUCOSE: 93 mg/dL (ref 70–99)
Potassium: 4.1 mmol/L (ref 3.5–5.1)
Sodium: 137 mmol/L (ref 135–145)
TOTAL PROTEIN: 7.9 g/dL (ref 6.5–8.1)
Total Bilirubin: 0.5 mg/dL (ref 0.3–1.2)

## 2018-04-18 LAB — URINALYSIS, ROUTINE W REFLEX MICROSCOPIC
Bilirubin Urine: NEGATIVE
GLUCOSE, UA: NEGATIVE mg/dL
Hgb urine dipstick: NEGATIVE
Ketones, ur: NEGATIVE mg/dL
Leukocytes,Ua: NEGATIVE
NITRITE: NEGATIVE
Protein, ur: 100 mg/dL — AB
Specific Gravity, Urine: 1.013 (ref 1.005–1.030)
pH: 5 (ref 5.0–8.0)

## 2018-04-18 LAB — CBC WITH DIFFERENTIAL/PLATELET
Abs Immature Granulocytes: 0.04 10*3/uL (ref 0.00–0.07)
Basophils Absolute: 0 10*3/uL (ref 0.0–0.1)
Basophils Relative: 1 %
Eosinophils Absolute: 0.2 10*3/uL (ref 0.0–0.5)
Eosinophils Relative: 2 %
HCT: 47.4 % — ABNORMAL HIGH (ref 36.0–46.0)
Hemoglobin: 14.9 g/dL (ref 12.0–15.0)
Immature Granulocytes: 1 %
LYMPHS ABS: 3.3 10*3/uL (ref 0.7–4.0)
LYMPHS PCT: 39 %
MCH: 30.3 pg (ref 26.0–34.0)
MCHC: 31.4 g/dL (ref 30.0–36.0)
MCV: 96.5 fL (ref 80.0–100.0)
Monocytes Absolute: 0.6 10*3/uL (ref 0.1–1.0)
Monocytes Relative: 7 %
Neutro Abs: 4.5 10*3/uL (ref 1.7–7.7)
Neutrophils Relative %: 50 %
Platelets: 291 10*3/uL (ref 150–400)
RBC: 4.91 MIL/uL (ref 3.87–5.11)
RDW: 13.6 % (ref 11.5–15.5)
WBC: 8.6 10*3/uL (ref 4.0–10.5)
nRBC: 0 % (ref 0.0–0.2)

## 2018-04-18 LAB — RAPID URINE DRUG SCREEN, HOSP PERFORMED
Amphetamines: NOT DETECTED
Barbiturates: NOT DETECTED
Benzodiazepines: NOT DETECTED
Cocaine: NOT DETECTED
OPIATES: NOT DETECTED
Tetrahydrocannabinol: NOT DETECTED

## 2018-04-18 LAB — I-STAT CREATININE, ED: Creatinine, Ser: 1.3 mg/dL — ABNORMAL HIGH (ref 0.44–1.00)

## 2018-04-18 LAB — I-STAT TROPONIN, ED: Troponin i, poc: 0.03 ng/mL (ref 0.00–0.08)

## 2018-04-18 LAB — FOLATE: Folate: 15.3 ng/mL (ref >5.9)

## 2018-04-18 LAB — VITAMIN B12: Vitamin B-12: 571 pg/mL (ref 180–914)

## 2018-04-18 LAB — AMMONIA: Ammonia: 29 umol/L (ref 9–35)

## 2018-04-18 LAB — ETHANOL: Alcohol, Ethyl (B): <10 mg/dL (ref <10)

## 2018-04-18 LAB — TSH: TSH: 0.717 u[IU]/mL (ref 0.350–4.500)

## 2018-04-18 MED ORDER — ISOSORBIDE MONONITRATE ER 60 MG PO TB24
90.0000 mg | ORAL_TABLET | Freq: Every day | ORAL | Status: DC
  Administered 2018-04-19: 90 mg via ORAL
  Filled 2018-04-18: qty 1

## 2018-04-18 MED ORDER — HYDRALAZINE HCL 50 MG PO TABS
100.0000 mg | ORAL_TABLET | Freq: Once | ORAL | Status: AC
  Administered 2018-04-18: 100 mg via ORAL
  Filled 2018-04-18: qty 2

## 2018-04-18 MED ORDER — ISOSORBIDE MONONITRATE ER 60 MG PO TB24
90.0000 mg | ORAL_TABLET | Freq: Once | ORAL | Status: AC
  Administered 2018-04-18: 90 mg via ORAL
  Filled 2018-04-18: qty 1

## 2018-04-18 MED ORDER — IOPAMIDOL (ISOVUE-370) INJECTION 76%
100.0000 mL | Freq: Once | INTRAVENOUS | Status: AC | PRN
  Administered 2018-04-18: 80 mL via INTRAVENOUS

## 2018-04-18 MED ORDER — PANTOPRAZOLE SODIUM 40 MG PO TBEC
40.0000 mg | DELAYED_RELEASE_TABLET | Freq: Every day | ORAL | Status: DC
  Administered 2018-04-19: 40 mg via ORAL
  Filled 2018-04-18: qty 1

## 2018-04-18 MED ORDER — CLONIDINE HCL 0.1 MG PO TABS
0.1000 mg | ORAL_TABLET | Freq: Once | ORAL | Status: AC
  Administered 2018-04-18: 0.1 mg via ORAL
  Filled 2018-04-18: qty 1

## 2018-04-18 MED ORDER — HYDRALAZINE HCL 20 MG/ML IJ SOLN
10.0000 mg | Freq: Once | INTRAMUSCULAR | Status: AC
  Administered 2018-04-18: 10 mg via INTRAVENOUS
  Filled 2018-04-18: qty 1

## 2018-04-18 MED ORDER — SODIUM CHLORIDE 0.9 % IV BOLUS
1000.0000 mL | Freq: Once | INTRAVENOUS | Status: AC
  Administered 2018-04-18: 1000 mL via INTRAVENOUS

## 2018-04-18 NOTE — ED Notes (Signed)
Patient transported to CT 

## 2018-04-18 NOTE — ED Notes (Signed)
Pt ambulated to the restroom.
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
## 2018-04-19 DIAGNOSIS — F200 Paranoid schizophrenia: Secondary | ICD-10-CM

## 2018-04-19 DIAGNOSIS — I701 Atherosclerosis of renal artery: Secondary | ICD-10-CM | POA: Diagnosis not present

## 2018-04-19 LAB — URINE CULTURE

## 2018-04-19 MED ORDER — TRAZODONE HCL 100 MG PO TABS: 100.0000 mg | ORAL_TABLET | Freq: Every day | ORAL | Status: DC

## 2018-04-19 MED ORDER — HYDRALAZINE HCL 50 MG PO TABS
100.0000 mg | ORAL_TABLET | Freq: Three times a day (TID) | ORAL | Status: DC
  Administered 2018-04-19: 100 mg via ORAL
  Filled 2018-04-19 (×2): qty 2

## 2018-04-19 MED ORDER — OLANZAPINE 5 MG PO TBDP
5.0000 mg | ORAL_TABLET | Freq: Two times a day (BID) | ORAL | Status: DC
  Administered 2018-04-19: 5 mg via ORAL
  Filled 2018-04-19: qty 1

## 2018-04-19 MED ORDER — TRAZODONE HCL 50 MG PO TABS: 50.0000 mg | ORAL_TABLET | Freq: Every day | ORAL | Status: DC

## 2018-04-19 NOTE — Consult Note (Addendum)
[facility]   Reason for Consult:  Altered mental status Referring Physician:  EDP Patient Identification: Erin Riley H Sipp MRN:  [id number] Principal Diagnosis: Paranoid schizophrenia (HCC) Diagnosis:  Principal Problem:   Paranoid schizophrenia (HCC)   Total Time spent with patient: 30 minutes  Subjective:   Erin Riley H Asmar is a 68 y.o. female patient who [facility] and [facility]accompan[facility] with flight of ideas and poor history regarding current symptoms.  HPI: Per chart review, pt noted to be a poor historian with flight of ideas. ED provider obtained collateral information from Pt's psychiatrist's office at [facility] where she was seen prior to ED visit. Patient had been referred to ED due to level of altered mental status and flight of ideas.      On Evaluation Today:  CC: they have chemicals in the bathrooms and I have to wash it off".  In room, patient observed washing her feet with wash cloth and water. She reported that she has to do this because "they put crack in the bathroom floor". When questioned about why she was hospitalized, her response was very tangental with flight of ideas. This provider was unable to obtain any substantial history from patient due to her thought process and content. She however reported that she lived with her son Denzel and [facility].  Pt denies suicidal/homicidal ideation, denies auditory/visual hallucinations although she was observed to be speaking to herself prior to interview.                                     Past Psychiatric History: Schizoprenia  Risk to Self: Suicidal Ideation: No(Pt denies. ) Suicidal Intent: No Is patient at risk for suicide?: No Suicidal Plan?: No(Pt denies. ) Access to Means: Yes Specify Access to Suicidal Means: Pt reported her son has a licensed gun.  What has been your use of drugs/alcohol within the last 12 months?: Negative.  How many times?: 0 Other Self  Harm Risks: NA Triggers for Past Attempts: None known Intentional Self Injurious Behavior: None(Pt denies. ) Risk to Others: Homicidal Ideation: No(Pt denies. ) Thoughts of Harm to Others: No(Pt denies. ) Current Homicidal Intent: No Current Homicidal Plan: No(Pt denies. ) Access to Homicidal Means: No Identified Victim: NA History of harm to others?: No Assessment of Violence: None Noted Violent Behavior Description: NA Does patient have access to weapons?: No Criminal Charges Pending?: No Does patient have a court date: No Prior Inpatient Therapy: Prior Inpatient Therapy: (UTA) Prior Outpatient Therapy: Prior Outpatient Therapy: Yes Prior Therapy Dates: Current.  [facility]  Reason for Treatment: Medication management.  Does patient have an ACCT team?: No Does patient have Intensive In-House Services?  : No Does patient have [facility] services? : No Does patient have P4CC services?: No  Past Medical History:  Past Medical History:  Diagnosis Date  . Chronic back pain   . Chronic kidney disease (CKD), stage II (mild)    Class I-II  . Coronary artery disease 04/2009   50% stenosis in the perforator of LAD; catheterization was for an abnormal Myoview in January 2000 showing anterior and inferolateral ischemia.  . Diverticulitis   . 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 > 3 subsequent Echos show improved/normal EF with moderate to severe  concentric LVH and diastolic dysfunction with LVOT/intracavitary gradient --> 06/2016: Severe LVH.  Vigorous EF, 65-70%.?? Gr 1 DD. Mild AS.  [name] History of schizophrenia    However I am not sure about the validity of this. She is not on any medications.  . Hyperlipidemia   . Hypertension   . Hypertensive hypertrophic cardiomyopathy: NYHA class II:  Echo: Severe concentric LVH with LV OT gradient;  essentially preserved EF with diastolic dysfunction 02/15/2013   Echo 06/2016: Severe Concentric LVH. Vigorous EF 65-70%. ~ Gr I DD.   . Mild aortic stenosis by prior echocardiography    Echo 06/2016: Mild AS (Mean Gradient 15 mmHg); has had prior Mod-Severe MR (not seen on current echo)  . PAD (peripheral artery disease) [facility]) March 2013   Lower extremity Dopplers: R. SFA 50-60%, R. PTA proximally occluded with distal reconstitution;; L. common iliac ~50%, L. SFA 50-70% stenosis, L. PTA < 50%  . Schizophrenia (HCC)   . Urinary tract infection     Past Surgical History:  Procedure Laterality Date  . BUNIONECTOMY    . carotid doppler  05/29/2011   left bulb/prox ICA moderate amtfibrous plaque with no evidence significant reduction.,right bulb /proximal ICA normal patency  . lower extremity doppler  05/29/2011   right SFA 50% to 59% diameter reduction,right posterior tibal atreery occlusive disease,reconstituting distally, left common illiac<50%,left SFA 50 to70%,left post. tibial <50%  . NM MYOCAR PERF WALL MOTION  03/2009   Persantine; EF 51%-both anterior and inferolateral ischemia  . TRANSTHORACIC ECHOCARDIOGRAM  06/2016   Severe LVH.  Vigorous EF of 65-70%.  No RWMA. ~Only grade 1 diastolic dysfunction.  Mild aortic stenosis (mean gradient 15 mmHg)  . TRANSTHORACIC ECHOCARDIOGRAM  07/2012   EF 50-55%; severe concentric LVH; only grade 1 diastolic dysfunction. Mild aortic sclerosis - with LVOT /intracavitary gradient of roughly 20 mmHg mean. Mild to moderately dilated LA;; previously reported MR not seen   Family History:  Family History  Problem Relation Age of Onset  . Hypertension Mother   . Breast cancer Neg Hx    Family Psychiatric  History: As above Social History:  Social History   Substance and Sexual Activity  Alcohol Use No     Social History   Substance and Sexual Activity  Drug Use No    Social History   Socioeconomic History  . Marital status: Single     Spouse name: Not on file  . Number of children: 7  . Years of education: Not on file  . Highest education level: Not on file  Occupational History  . Not on file  Social Needs  . Financial resource strain: Not on file  . Food insecurity:    Worry: Not on file    Inability: Not on file  . Transportation needs:    Medical: Not on file    Non-medical: Not on file  Tobacco Use  . Smoking status: Former Smoker    Types: Cigarettes    Last attempt to quit: 05/14/2002    Years since quitting: 15.9  . Smokeless tobacco: Never Used  Substance and Sexual Activity  . Alcohol use: No  . Drug use: No  . Sexual activity: Yes    Birth control/protection: Post-menopausal  Lifestyle  . Physical activity:    Days per week: Not on file    Minutes per session: Not on file  . Stress: Not on file  Relationships  . Social connections:    Talks on phone: Not on file  Gets together: Not on file    Attends religious service: Not on file    Active member of club or organization: Not on file    Attends meetings of clubs or organizations: Not on file    Relationship status: Not on file  Other Topics Concern  . Not on file  Social History Narrative   Now single mother of 2 with one grandchild. She quit smoking roughly 5 years ago and is not so since. She has also stopped drinking alcohol. She does try get routine exercise walking at least a mile 3-4 days a week.    She lives with her 29 year old mother. 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   Additional Social History: N/A    Allergies:  No Known Allergies  Labs:  Results for orders placed or performed during the hospital encounter of 04/18/18 (from the past 48 hour(s))  Ammonia     Status: None   Collection Time: 04/18/18  2:47 PM  Result Value Ref Range   Ammonia 29 9 - 35 umol/L    Comment: Performed at [facility], [address], [place], [place] [address]  TSH     Status: None   Collection Time: 04/18/18  2:48 PM   Result Value Ref Range   TSH 0.717 0.350 - 4.500 uIU/mL    Comment: Performed by a 3rd Generation assay with a functional sensitivity of <=0.01 uIU/mL. Performed at [facility], [address] [place], [place] [address]   Vitamin B12     Status: None   Collection Time: 04/18/18  2:48 PM  Result Value Ref Range   Vitamin B-12 571 180 - 914 pg/mL    Comment: (NOTE) This assay is not validated for testing neonatal or myeloproliferative syndrome specimens for Vitamin B12 levels. Performed at [facility], [address], [place], [place] [address]   Folate     Status: None   Collection Time: 04/18/18  2:48 PM  Result Value Ref Range   Folate 15.3 >5.9 ng/mL    Comment: Performed at [facility], [address], [place], [place] [address]  CBC with Differential     Status: Abnormal   Collection Time: 04/18/18  3:38 PM  Result Value Ref Range   WBC 8.6 4.0 - 10.5 K/uL   RBC 4.91 3.87 - 5.11 MIL/uL   Hemoglobin 14.9 12.0 - 15.0 g/dL   HCT 29.5 (H) 62.1 - 30.8 %   MCV 96.5 80.0 - 100.0 fL   MCH 30.3 26.0 - 34.0 pg   MCHC 31.4 30.0 - 36.0 g/dL   RDW 65.7 84.6 - 96.2 %   Platelets 291 150 - 400 K/uL   nRBC 0.0 0.0 - 0.2 %   Neutrophils Relative % 50 %   Neutro Abs 4.5 1.7 - 7.7 K/uL   Lymphocytes Relative 39 %   Lymphs Abs 3.3 0.7 - 4.0 K/uL   Monocytes Relative 7 %   Monocytes Absolute 0.6 0.1 - 1.0 K/uL   Eosinophils Relative 2 %   Eosinophils Absolute 0.2 0.0 - 0.5 K/uL   Basophils Relative 1 %   Basophils Absolute 0.0 0.0 - 0.1 K/uL   Immature Granulocytes 1 %   Abs Immature Granulocytes 0.04 0.00 - 0.07 K/uL    Comment: Performed at [facility], [address], [place], [place] [address]  Comprehensive metabolic panel     Status: Abnormal   Collection Time: 04/18/18  3:38 PM  Result  Value Ref Range   Sodium 137 135 - 145 mmol/L   Potassium 4.1 3.5 - 5.1 mmol/L   Chloride 105 98 - 111 mmol/L    CO2 24 22 - 32 mmol/L   Glucose, Bld 93 70 - 99 mg/dL   BUN 18 8 - 23 mg/dL   Creatinine, Ser 1.61 (H) 0.44 - 1.00 mg/dL   Calcium 9.5 8.9 - 09.6 mg/dL   Total Protein 7.9 6.5 - 8.1 g/dL   Albumin 4.2 3.5 - 5.0 g/dL   AST 25 15 - 41 U/L   ALT 21 0 - 44 U/L   Alkaline Phosphatase 80 38 - 126 U/L   Total Bilirubin 0.5 0.3 - 1.2 mg/dL   GFR calc non Af Amer 44 (L) >60 mL/min   GFR calc Af Amer 51 (L) >60 mL/min   Anion gap 8 5 - 15    Comment: Performed at [facility], [address] [place], [place] [address]  Ethanol     Status: None   Collection Time: 04/18/18  3:39 PM  Result Value Ref Range   Alcohol, Ethyl (B) <10 <10 mg/dL    Comment: (NOTE) Lowest detectable limit for serum alcohol is 10 mg/dL. For medical purposes only. Performed at [facility], [address] [place], [place] [address]   Urinalysis, Routine w reflex microscopic     Status: Abnormal   Collection Time: 04/18/18  3:59 PM  Result Value Ref Range   Color, Urine YELLOW YELLOW   APPearance CLEAR CLEAR   Specific Gravity, Urine 1.013 1.005 - 1.030   pH 5.0 5.0 - 8.0   Glucose, UA NEGATIVE NEGATIVE mg/dL   Hgb urine dipstick NEGATIVE NEGATIVE   Bilirubin Urine NEGATIVE NEGATIVE   Ketones, ur NEGATIVE NEGATIVE mg/dL   Protein, ur 147 (A) NEGATIVE mg/dL   Nitrite NEGATIVE NEGATIVE   Leukocytes,Ua NEGATIVE NEGATIVE   RBC / HPF 0-5 0 - 5 RBC/hpf   WBC, UA 0-5 0 - 5 WBC/hpf   Bacteria, UA RARE (A) NONE SEEN   Squamous Epithelial / LPF 0-5 0 - 5   Mucus PRESENT    Hyaline Casts, UA PRESENT     Comment: Performed at [facility], [address], [place], [place] [address]  Urine rapid drug screen (hosp performed)     Status: None   Collection Time: 04/18/18  4:00 PM  Result Value Ref Range   Opiates NONE DETECTED NONE DETECTED   Cocaine NONE DETECTED NONE DETECTED   Benzodiazepines NONE DETECTED NONE DETECTED   Amphetamines NONE DETECTED NONE  DETECTED   Tetrahydrocannabinol NONE DETECTED NONE DETECTED   Barbiturates NONE DETECTED NONE DETECTED    Comment: (NOTE) DRUG SCREEN FOR MEDICAL PURPOSES ONLY.  IF CONFIRMATION IS NEEDED FOR ANY PURPOSE, NOTIFY LAB WITHIN 5 DAYS. LOWEST DETECTABLE LIMITS FOR URINE DRUG SCREEN Drug Class                     Cutoff (ng/mL) Amphetamine and metabolites    1000 Barbiturate and metabolites    200 Benzodiazepine                 200 Tricyclics and metabolites     300 Opiates and metabolites        300 Cocaine and metabolites        300 THC  50 Performed at [facility], [address], [place], [place] [address]   I-stat Creatinine, ED     Status: Abnormal   Collection Time: 04/18/18  4:38 PM  Result Value Ref Range   Creatinine, Ser 1.30 (H) 0.44 - 1.00 mg/dL  I-Stat Troponin, ED (not at [facility])     Status: None   Collection Time: 04/18/18  7:54 PM  Result Value Ref Range   Troponin i, poc 0.03 0.00 - 0.08 ng/mL   Comment 3            Comment: Due to the release kinetics of cTnI, a negative result within the first hours of the onset of symptoms does not rule out myocardial infarction with certainty. If myocardial infarction is still suspected, repeat the test at appropriate intervals.     Current Facility-Administered Medications  Medication Dose Route Frequency Provider Last Rate Last Dose  . hydrALAZINE (APRESOLINE) tablet 100 mg  100 mg Oral TID Gilda Crease, MD   100 mg at 04/19/18 1033  . isosorbide mononitrate (IMDUR) 24 hr tablet 90 mg  90 mg Oral Daily Murray, Alyssa B, PA-C   90 mg at 04/19/18 1034  . OLANZapine zydis (ZYPREXA) disintegrating tablet 5 mg  5 mg Oral BID Juanetta Beets J, DO   5 mg at 04/19/18 1034  . pantoprazole (PROTONIX) EC tablet 40 mg  40 mg Oral Daily Murray, Alyssa B, PA-C   40 mg at 04/19/18 1035  . traZODone (DESYREL) tablet 50 mg  50 mg Oral QHS Laveda Abbe, NP       Current  Outpatient Medications  Medication Sig Dispense Refill  . alum & mag hydroxide-simeth (MAALOX/MYLANTA) 200-200-20 MG/5ML suspension Take 30 mLs by mouth every 6 (six) hours as needed for indigestion or heartburn. 355 mL 0  . aspirin EC 81 MG tablet Take 81 mg by mouth daily.    . famotidine (PEPCID) 20 MG tablet Take 1 tablet (20 mg total) by mouth at bedtime. 30 tablet 1  . hydrALAZINE (APRESOLINE) 100 MG tablet Take 1 tablet (100 mg total) by mouth 3 (three) times daily. For high blood pressure 270 tablet 2  . isosorbide mononitrate (IMDUR) 60 MG 24 hr tablet Take 1.5 tablets (90 mg total) by mouth daily. 90 tablet 2  . pantoprazole (PROTONIX) 40 MG tablet Take 1 tablet (40 mg total) by mouth daily. 60 tablet 0  . triamcinolone (KENALOG) 0.025 % ointment Apply 1 application topically daily as needed for itching.    . carvedilol (COREG) 25 MG tablet Take 1 tablet (25 mg total) by mouth 2 (two) times daily with a meal. (Patient not taking: Reported on 04/18/2018) 60 tablet 2  . hydrOXYzine (ATARAX/VISTARIL) 25 MG tablet Take 1 tablet (25 mg total) by mouth every 6 (six) hours. (Patient not taking: Reported on 04/18/2018) 15 tablet 0  . predniSONE (DELTASONE) 10 MG tablet Take 2 tablets (20 mg total) by mouth 2 (two) times daily with a meal. (Patient not taking: Reported on 04/18/2018) 12 tablet 0    Musculoskeletal: Strength & Muscle Tone: within normal limits Gait & Station: not observed Patient leans: N/A  Psychiatric Specialty Exam: Physical Exam  Nursing note and vitals reviewed. Constitutional: She is oriented to person, place, and time. She appears well-developed and well-nourished.  HENT:  Head: Normocephalic and atraumatic.  Neck: Normal range of motion.  Musculoskeletal: Normal range of motion.  Neurological: She is alert and oriented to person, place, and time.  Psychiatric: Her mood appears anxious. Her speech is rapid and/or pressured. She is agitated and actively hallucinating.  Thought content is paranoid and delusional. Cognition and memory are impaired. She expresses impulsivity.    Review of Systems  Constitutional: Negative.   Respiratory: Negative.   Neurological: Negative.   Psychiatric/Behavioral: Negative for depression, substance abuse and suicidal ideas. The patient is nervous/anxious.     Blood pressure 129/69, pulse (!) 108, temperature 98.5 F (36.9 C), temperature source Oral, resp. rate 18, height 5\' 7"  (1.702 m), weight 97.5 kg, last menstrual period 05/10/2013, SpO2 93 %.Body mass index is 33.67 kg/m.  General Appearance: Disheveled  Eye Contact:  Fair  Speech:  Pressured  Volume:  Increased  Mood:  preoccupied  Affect:  Flat  Thought Process:  Disorganized and Descriptions of Associations: Tangential  Orientation:  Full (Time, Place, and Person)  Thought Content:  Illogical and Tangential  Suicidal Thoughts:  No  Homicidal Thoughts:  No  Memory:  Immediate;   Poor Recent;   Poor Remote;   Poor  Judgement:  Impaired  Insight:  Lacking  Psychomotor Activity:  Normal  Concentration:  Concentration: Poor and Attention Span: Poor  Recall:  Poor  Fund of Knowledge:  Poor  Language:  Fair  Akathisia:  No  Handed:  Right  AIMS (if indicated):   N/A  Assets:  Housing  ADL's:  Intact  Cognition:  Impaired,  Moderate  Sleep:   N/A     Treatment Plan Summary: Daily contact with patient to assess and evaluate symptoms and progress in treatment and Medication management  Start Zyprexa 5mg  BID, Trazodone 50mg  QHS   Disposition: Recommend psychiatric Inpatient admission. Pt has been accepted to [facility]  L[place] Abbe, [place] 04/19/2018 1:35 PM   Patient seen face-to-face for psychiatric evaluation, chart reviewed and case discussed with the physician extender and developed treatment plan. Reviewed the information documented and agree with the treatment plan.  Juanetta Beets, DO 04/19/18 6:52 PM

## 2018-04-19 NOTE — BHH Counselor (Signed)
Pt declined to have clinician call family/friend supports. Pt reported, her son knows she's in the ED.     Redmond Pulling, MS, [facility], [facility] Triage Specialist [phone]

## 2018-04-19 NOTE — ED Notes (Signed)
BP 173/98. Called Dr. Blinda Leatherwood. Home medications ordered. Will continue to monitor.

## 2018-04-19 NOTE — BH Assessment (Addendum)
[facility] Assessment Progress Note  Per Juanetta Beets, DO, this pt requires psychiatric hospitalization at this time.  Dr Sharma Covert also finds that pt meets criteria for IVC, which she has initiated.  IVC documents have been faxed to [facility], and at 13:54 Burna Cash confirms receipt.  She has since faxed Findings and Custody Order to this Clinical research associate.  At 14:06 I called [employer] and spoke to [employer], who took demographic information, agreeing to dispatch law enforcement to fill out Return of Service.  [place] police then presented at [facility], completing Return of Service.  At 14:56 Revonda Standard calls from [facility] to report that pt has been accepted to their facility by Dr Loyola Mast to the [place] [facility].  Elta Guadeloupe, FNP concurs with this decision.  Pt's nurse, Misty Stanley, has been notified, and agrees to call report to [phone].  Pt is to be transported via [facility].  Doylene Canning Behavioral Health Coordinator [phone]

## 2018-04-19 NOTE — ED Notes (Signed)
Report given to Wilhelmina Mcardle, provider at [facility] called for transport

## 2018-04-19 NOTE — Discharge Instructions (Addendum)
When you are discharged from the hospital, you will need of reevaluation by your primary care provider of the imaging that we got today.  It shows that you have some calcifications in your femoral arteries as well as your kidney arteries.  This could be contributing to your high blood pressure.  You will need further work-up and follow-up with this.  Please have your primary care provider request the records from today's imaging to review with you.   Please also have your blood pressure medications rechecked by your primary care provider.  It appears that you have blood pressure that goes up and down very rapidly.  We would like to get this better under control.

## 2018-04-19 NOTE — ED Notes (Signed)
Report called to [facility] accepting patient-sheriff called for transport-IVC/[place] and patient's belongings to go with patient via sheriff

## 2018-06-30 ENCOUNTER — Other Ambulatory Visit: Payer: Self-pay

## 2018-06-30 ENCOUNTER — Emergency Department (HOSPITAL_COMMUNITY)
Admission: EM | Admit: 2018-06-30 | Discharge: 2018-06-30 | Disposition: A | Discharge status: Home / Self Care | Payer: Medicare Other | Attending: Emergency Medicine | Admitting: Emergency Medicine

## 2018-06-30 ENCOUNTER — Encounter (HOSPITAL_COMMUNITY): Payer: Self-pay | Admitting: *Deleted

## 2018-06-30 DIAGNOSIS — N183 Chronic kidney disease, stage 3 (moderate): Secondary | ICD-10-CM | POA: Diagnosis not present

## 2018-06-30 DIAGNOSIS — N760 Acute vaginitis: Secondary | ICD-10-CM | POA: Diagnosis not present

## 2018-06-30 DIAGNOSIS — Z87891 Personal history of nicotine dependence: Secondary | ICD-10-CM | POA: Insufficient documentation

## 2018-06-30 DIAGNOSIS — I259 Chronic ischemic heart disease, unspecified: Secondary | ICD-10-CM | POA: Insufficient documentation

## 2018-06-30 DIAGNOSIS — I129 Hypertensive chronic kidney disease with stage 1 through stage 4 chronic kidney disease, or unspecified chronic kidney disease: Secondary | ICD-10-CM | POA: Diagnosis not present

## 2018-06-30 DIAGNOSIS — N898 Other specified noninflammatory disorders of vagina: Secondary | ICD-10-CM | POA: Diagnosis present

## 2018-06-30 DIAGNOSIS — Z79899 Other long term (current) drug therapy: Secondary | ICD-10-CM | POA: Diagnosis not present

## 2018-06-30 DIAGNOSIS — B9689 Other specified bacterial agents as the cause of diseases classified elsewhere: Secondary | ICD-10-CM

## 2018-06-30 LAB — WET PREP, GENITAL
Sperm: NONE SEEN
Trich, Wet Prep: NONE SEEN
Yeast Wet Prep HPF POC: NONE SEEN

## 2018-06-30 MED ORDER — METRONIDAZOLE 500 MG PO TABS: 500.0000 mg | ORAL_TABLET | Freq: Two times a day (BID) | ORAL | 0 refills | Status: DC
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
## 2018-06-30 NOTE — ED Triage Notes (Signed)
Pt states she noticed heavy yellowish vaginal discharge yesterday. Pt also reports vaginal itching and burning, which is worse when urinating.

## 2018-06-30 NOTE — ED Notes (Signed)
Pt. Went to the restroom upon arrival. Did not get a urine specimen. Nurse aware.

## 2018-06-30 NOTE — Discharge Instructions (Addendum)
Flagyl as prescribed.  Follow-up with your primary doctor if symptoms or not improving in the next week, and return to the ER if symptoms significantly worsen or change.

## 2018-07-01 LAB — GC/CHLAMYDIA PROBE AMP (~~LOC~~) NOT AT ARMC
Chlamydia: NEGATIVE
Neisseria Gonorrhea: NEGATIVE

## 2018-07-11 ENCOUNTER — Emergency Department (HOSPITAL_COMMUNITY)
Admission: EM | Admit: 2018-07-11 | Discharge: 2018-07-11 | Disposition: A | Discharge status: Home / Self Care | Payer: Medicare Other | Attending: Emergency Medicine | Admitting: Emergency Medicine

## 2018-07-11 ENCOUNTER — Other Ambulatory Visit: Payer: Self-pay

## 2018-07-11 DIAGNOSIS — I129 Hypertensive chronic kidney disease with stage 1 through stage 4 chronic kidney disease, or unspecified chronic kidney disease: Secondary | ICD-10-CM | POA: Diagnosis not present

## 2018-07-11 DIAGNOSIS — Z7982 Long term (current) use of aspirin: Secondary | ICD-10-CM | POA: Diagnosis not present

## 2018-07-11 DIAGNOSIS — I2510 Atherosclerotic heart disease of native coronary artery without angina pectoris: Secondary | ICD-10-CM | POA: Insufficient documentation

## 2018-07-11 DIAGNOSIS — Z79899 Other long term (current) drug therapy: Secondary | ICD-10-CM | POA: Diagnosis not present

## 2018-07-11 DIAGNOSIS — Z87891 Personal history of nicotine dependence: Secondary | ICD-10-CM | POA: Insufficient documentation

## 2018-07-11 DIAGNOSIS — N760 Acute vaginitis: Secondary | ICD-10-CM | POA: Diagnosis not present

## 2018-07-11 DIAGNOSIS — N182 Chronic kidney disease, stage 2 (mild): Secondary | ICD-10-CM | POA: Diagnosis not present

## 2018-07-11 DIAGNOSIS — B9689 Other specified bacterial agents as the cause of diseases classified elsewhere: Secondary | ICD-10-CM

## 2018-07-11 DIAGNOSIS — N898 Other specified noninflammatory disorders of vagina: Secondary | ICD-10-CM | POA: Diagnosis present

## 2018-07-11 LAB — URINALYSIS, ROUTINE W REFLEX MICROSCOPIC
Bilirubin Urine: NEGATIVE
Glucose, UA: NEGATIVE mg/dL
Hgb urine dipstick: NEGATIVE
Ketones, ur: NEGATIVE mg/dL
Leukocytes,Ua: NEGATIVE
Nitrite: NEGATIVE
Protein, ur: NEGATIVE mg/dL
Specific Gravity, Urine: 1.014 (ref 1.005–1.030)
pH: 6 (ref 5.0–8.0)

## 2018-07-11 MED ORDER — METRONIDAZOLE 500 MG PO TABS: 500.0000 mg | ORAL_TABLET | Freq: Two times a day (BID) | ORAL | 0 refills | Status: DC

## 2018-07-11 NOTE — ED Notes (Signed)
Discharge paperwork reviewed with pt, emphasis placed on taking antibiotic as prescribed in order to be effective.  Pt appeared dissatisfied with results of UA, stating "This isnt what Dr. Sedonia Small had found."   This RN stated she was unaware of mentioned Dr, only able to provide information about what current ED MD was prescribing and diagnosing.  Pt requested wheelchair escort to ED entrance, which will be provided.  Pt alert, able to walk to doorway and back to bed when requesting wheelchair, speaking in full sentences.
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
## 2018-07-11 NOTE — ED Triage Notes (Signed)
Pt reports vaginal burning, dysuria starting this morning.   Also reports left sided pain.  Pt reports left side has been hurting since time she was seen here, was prescribed antibiotics, no relief.

## 2018-07-11 NOTE — ED Notes (Signed)
Pt provided with water, encourage to drink.  Pt informed that RN will return to request urine sample shortly.

## 2018-07-11 NOTE — Discharge Instructions (Addendum)
Please return for any problem. Follow up with your regular care provider as instructed.   It is imperative that you take the course of Flagyl as prescribed -- take one tablet in the morning and one tablet in the evening for 7 days. There should be no pills left over.   Failure to take Flagyl as prescribed will result in your symptoms failing to improve.

## 2018-07-11 NOTE — ED Notes (Signed)
Pt able to independently ambulate to bathroom. 

## 2018-08-02 ENCOUNTER — Inpatient Hospital Stay: Admission: RE | Admit: 2018-08-02 | Payer: Medicare Other | Source: Ambulatory Visit

## 2018-08-06 ENCOUNTER — Emergency Department (HOSPITAL_COMMUNITY)
Admission: EM | Admit: 2018-08-06 | Discharge: 2018-08-06 | Disposition: A | Discharge status: Home / Self Care | Payer: Medicare Other | Attending: Emergency Medicine | Admitting: Emergency Medicine

## 2018-08-06 ENCOUNTER — Other Ambulatory Visit: Payer: Self-pay

## 2018-08-06 ENCOUNTER — Emergency Department (HOSPITAL_COMMUNITY): Payer: Medicare Other

## 2018-08-06 ENCOUNTER — Encounter (HOSPITAL_COMMUNITY): Payer: Self-pay | Admitting: Emergency Medicine

## 2018-08-06 DIAGNOSIS — Z87891 Personal history of nicotine dependence: Secondary | ICD-10-CM | POA: Diagnosis not present

## 2018-08-06 DIAGNOSIS — Z79899 Other long term (current) drug therapy: Secondary | ICD-10-CM | POA: Insufficient documentation

## 2018-08-06 DIAGNOSIS — N182 Chronic kidney disease, stage 2 (mild): Secondary | ICD-10-CM | POA: Insufficient documentation

## 2018-08-06 DIAGNOSIS — F209 Schizophrenia, unspecified: Secondary | ICD-10-CM | POA: Insufficient documentation

## 2018-08-06 DIAGNOSIS — R1030 Lower abdominal pain, unspecified: Secondary | ICD-10-CM | POA: Diagnosis present

## 2018-08-06 DIAGNOSIS — I129 Hypertensive chronic kidney disease with stage 1 through stage 4 chronic kidney disease, or unspecified chronic kidney disease: Secondary | ICD-10-CM | POA: Diagnosis not present

## 2018-08-06 DIAGNOSIS — N939 Abnormal uterine and vaginal bleeding, unspecified: Secondary | ICD-10-CM | POA: Insufficient documentation

## 2018-08-06 DIAGNOSIS — I2510 Atherosclerotic heart disease of native coronary artery without angina pectoris: Secondary | ICD-10-CM | POA: Diagnosis not present

## 2018-08-06 LAB — COMPREHENSIVE METABOLIC PANEL
ALT: 14 U/L (ref 0–44)
AST: 15 U/L (ref 15–41)
Albumin: 3.9 g/dL (ref 3.5–5.0)
Alkaline Phosphatase: 78 U/L (ref 38–126)
Anion gap: 7 (ref 5–15)
BUN: 17 mg/dL (ref 8–23)
CO2: 25 mmol/L (ref 22–32)
Calcium: 9.5 mg/dL (ref 8.9–10.3)
Chloride: 106 mmol/L (ref 98–111)
Creatinine, Ser: 1.23 mg/dL — ABNORMAL HIGH (ref 0.44–1.00)
GFR calc Af Amer: 53 mL/min — ABNORMAL LOW (ref >60)
GFR calc non Af Amer: 45 mL/min — ABNORMAL LOW (ref >60)
Glucose, Bld: 112 mg/dL — ABNORMAL HIGH (ref 70–99)
Potassium: 4.4 mmol/L (ref 3.5–5.1)
Sodium: 138 mmol/L (ref 135–145)
Total Bilirubin: 0.6 mg/dL (ref 0.3–1.2)
Total Protein: 7.6 g/dL (ref 6.5–8.1)

## 2018-08-06 LAB — CBC WITH DIFFERENTIAL/PLATELET
Abs Immature Granulocytes: 0.02 10*3/uL (ref 0.00–0.07)
Basophils Absolute: 0 10*3/uL (ref 0.0–0.1)
Basophils Relative: 0 %
Eosinophils Absolute: 0.2 10*3/uL (ref 0.0–0.5)
Eosinophils Relative: 3 %
HCT: 41.9 % (ref 36.0–46.0)
Hemoglobin: 13.5 g/dL (ref 12.0–15.0)
Immature Granulocytes: 0 %
Lymphocytes Relative: 35 %
Lymphs Abs: 2.2 10*3/uL (ref 0.7–4.0)
MCH: 30.8 pg (ref 26.0–34.0)
MCHC: 32.2 g/dL (ref 30.0–36.0)
MCV: 95.4 fL (ref 80.0–100.0)
Monocytes Absolute: 0.4 10*3/uL (ref 0.1–1.0)
Monocytes Relative: 7 %
Neutro Abs: 3.5 10*3/uL (ref 1.7–7.7)
Neutrophils Relative %: 55 %
Platelets: 303 10*3/uL (ref 150–400)
RBC: 4.39 MIL/uL (ref 3.87–5.11)
RDW: 13.6 % (ref 11.5–15.5)
WBC: 6.3 10*3/uL (ref 4.0–10.5)
nRBC: 0 % (ref 0.0–0.2)

## 2018-08-06 LAB — URINALYSIS, ROUTINE W REFLEX MICROSCOPIC
Bilirubin Urine: NEGATIVE
Glucose, UA: NEGATIVE mg/dL
Hgb urine dipstick: NEGATIVE
Ketones, ur: NEGATIVE mg/dL
Leukocytes,Ua: NEGATIVE
Nitrite: NEGATIVE
Protein, ur: 100 mg/dL — AB
Specific Gravity, Urine: 1.015 (ref 1.005–1.030)
pH: 6 (ref 5.0–8.0)

## 2018-08-06 LAB — LIPASE, BLOOD: Lipase: 41 U/L (ref 11–51)

## 2018-08-06 LAB — RAPID URINE DRUG SCREEN, HOSP PERFORMED
Amphetamines: NOT DETECTED
Barbiturates: NOT DETECTED
Benzodiazepines: NOT DETECTED
Cocaine: NOT DETECTED
Opiates: NOT DETECTED
Tetrahydrocannabinol: NOT DETECTED

## 2018-08-06 MED ORDER — IOHEXOL 300 MG/ML  SOLN
100.0000 mL | Freq: Once | INTRAMUSCULAR | Status: AC | PRN
  Administered 2018-08-06: 13:00:00 100 mL via INTRAVENOUS

## 2018-08-06 MED ORDER — HYDROCHLOROTHIAZIDE 12.5 MG PO CAPS
25.0000 mg | ORAL_CAPSULE | Freq: Once | ORAL | Status: AC
  Administered 2018-08-06: 14:00:00 25 mg via ORAL
  Filled 2018-08-06: qty 2

## 2018-08-06 MED ORDER — SODIUM CHLORIDE (PF) 0.9 % IJ SOLN
INTRAMUSCULAR | Status: AC
  Filled 2018-08-06: qty 50

## 2018-08-06 MED ORDER — CLONIDINE HCL 0.1 MG PO TABS
0.1000 mg | ORAL_TABLET | Freq: Once | ORAL | Status: AC
  Administered 2018-08-06: 14:00:00 0.1 mg via ORAL
  Filled 2018-08-06: qty 1
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
## 2018-08-06 NOTE — Discharge Instructions (Addendum)
Lab work and imaging were reassuring. Follow-up with your primary care provider for any further management of this complaint. For the vaginal discharge, may follow-up with OB/GYN. Your blood pressure was high today.  Recommend taking your prescribed medications.

## 2018-08-06 NOTE — ED Triage Notes (Signed)
Per pt, states Dr Merilynn Finland sent her over here for vaginal itching and "contractions" in her back-has been seen for the same symptoms on 5/7-states issues started this am
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
## 2018-08-06 NOTE — ED Notes (Signed)
Patient stated that she is 9 months pregnant and having contractions. Patient stated that she needs an ultrasound to check the babies heart beat.

## 2018-08-06 NOTE — ED Notes (Signed)
Pelvic cart materials placed at bedside.

## 2018-08-06 NOTE — Progress Notes (Signed)
   08/06/18 1319  General Assessment Data  Reason for not completing assessment [facility] attempted to complete pt's assessment.  [facility] was told by pt's nurse Ngozi that pt was not medically cleared.  TTS will  complete assessment once pt is medically cleared.    Jeanmarc Viernes L. Madeleine Fenn, MS, [facility], [facility] Therapeutic Triage Specialist  [phone]

## 2018-08-06 NOTE — ED Notes (Signed)
This Clinical research associate spoke with TTS. Patient waiting for CT Scan Results before being medically cleared by provider.

## 2018-08-07 LAB — URINE CULTURE: Culture: NO GROWTH

## 2018-08-08 NOTE — Progress Notes (Signed)
[facility] ED TOC CM -referral 7 ED visits in six months   NCM arranged follow up appt with [facility], for September 20, 2018 at 3 pm. Pt states she has not seen an ob-gyn in the past few years. Contacted pt with appt info, unable to leave message. Will attempt call tomorrow with appt time. Isidoro Donning RN CCM Case Mgmt phone [phone]

## 2018-08-16 ENCOUNTER — Telehealth: Payer: Self-pay | Admitting: *Deleted

## 2018-08-16 NOTE — Telephone Encounter (Signed)
Contacted pt's son, Josephine Cables # [phone]. Pt's son answered and was able to give phone to pt. Provided pt with information on her new obgyn appt on July 17 at 3 pm at [facility], Dr Janyth Pupa. Jonnie Finner RN CCM Case Mgmt phone [phone]

## 2018-08-22 ENCOUNTER — Other Ambulatory Visit: Payer: Self-pay | Admitting: Student

## 2018-08-25 ENCOUNTER — Emergency Department (HOSPITAL_COMMUNITY)
Admission: EM | Admit: 2018-08-25 | Discharge: 2018-08-25 | Disposition: A | Discharge status: Home / Self Care | Payer: Medicare Other | Attending: Emergency Medicine | Admitting: Emergency Medicine

## 2018-08-25 ENCOUNTER — Other Ambulatory Visit: Payer: Self-pay

## 2018-08-25 ENCOUNTER — Encounter (HOSPITAL_COMMUNITY): Payer: Self-pay | Admitting: Emergency Medicine

## 2018-08-25 ENCOUNTER — Emergency Department (HOSPITAL_COMMUNITY)
Admission: EM | Admit: 2018-08-25 | Discharge: 2018-08-25 | Discharge status: Absent without leave | Payer: Medicare Other

## 2018-08-25 DIAGNOSIS — N898 Other specified noninflammatory disorders of vagina: Secondary | ICD-10-CM | POA: Diagnosis not present

## 2018-08-25 DIAGNOSIS — Z5321 Procedure and treatment not carried out due to patient leaving prior to being seen by health care provider: Secondary | ICD-10-CM | POA: Diagnosis not present
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
## 2018-08-25 NOTE — ED Notes (Signed)
Pt left to give someone a ride home.

## 2018-09-10 ENCOUNTER — Encounter (HOSPITAL_COMMUNITY): Payer: Self-pay

## 2018-09-10 ENCOUNTER — Other Ambulatory Visit: Payer: Self-pay

## 2018-09-10 ENCOUNTER — Emergency Department (HOSPITAL_COMMUNITY)
Admission: EM | Admit: 2018-09-10 | Discharge: 2018-09-10 | Discharge status: Against Medical Advice | Payer: Medicare Other | Attending: Emergency Medicine | Admitting: Emergency Medicine

## 2018-09-10 DIAGNOSIS — Z5321 Procedure and treatment not carried out due to patient leaving prior to being seen by health care provider: Secondary | ICD-10-CM | POA: Diagnosis not present

## 2018-09-10 DIAGNOSIS — R102 Pelvic and perineal pain: Secondary | ICD-10-CM | POA: Diagnosis present

## 2018-09-10 NOTE — ED Notes (Signed)
P[t placed back in the system and called again for a room with no answer

## 2018-09-10 NOTE — ED Triage Notes (Signed)
[facility]. Pt states she is overdue. Pt c/o burning

## 2018-09-11 ENCOUNTER — Observation Stay (HOSPITAL_COMMUNITY): Payer: Medicare Other

## 2018-09-11 ENCOUNTER — Emergency Department (HOSPITAL_COMMUNITY): Payer: Medicare Other

## 2018-09-11 ENCOUNTER — Observation Stay (HOSPITAL_COMMUNITY)
Admission: EM | Admit: 2018-09-11 | Discharge: 2018-09-13 | Payer: Medicare Other | Attending: Internal Medicine | Admitting: Internal Medicine

## 2018-09-11 ENCOUNTER — Observation Stay (HOSPITAL_BASED_OUTPATIENT_CLINIC_OR_DEPARTMENT_OTHER): Payer: Medicare Other

## 2018-09-11 ENCOUNTER — Encounter (HOSPITAL_COMMUNITY): Payer: Self-pay | Admitting: Family Medicine

## 2018-09-11 DIAGNOSIS — Z6834 Body mass index (BMI) 34.0-34.9, adult: Secondary | ICD-10-CM | POA: Diagnosis not present

## 2018-09-11 DIAGNOSIS — E669 Obesity, unspecified: Secondary | ICD-10-CM | POA: Diagnosis present

## 2018-09-11 DIAGNOSIS — Z1159 Encounter for screening for other viral diseases: Secondary | ICD-10-CM | POA: Diagnosis not present

## 2018-09-11 DIAGNOSIS — I10 Essential (primary) hypertension: Secondary | ICD-10-CM | POA: Diagnosis present

## 2018-09-11 DIAGNOSIS — R9431 Abnormal electrocardiogram [ECG] [EKG]: Secondary | ICD-10-CM

## 2018-09-11 DIAGNOSIS — F200 Paranoid schizophrenia: Principal | ICD-10-CM | POA: Diagnosis present

## 2018-09-11 DIAGNOSIS — Z79899 Other long term (current) drug therapy: Secondary | ICD-10-CM | POA: Insufficient documentation

## 2018-09-11 DIAGNOSIS — Z87891 Personal history of nicotine dependence: Secondary | ICD-10-CM | POA: Insufficient documentation

## 2018-09-11 DIAGNOSIS — R079 Chest pain, unspecified: Secondary | ICD-10-CM

## 2018-09-11 DIAGNOSIS — I739 Peripheral vascular disease, unspecified: Secondary | ICD-10-CM | POA: Diagnosis present

## 2018-09-11 DIAGNOSIS — N183 Chronic kidney disease, stage 3 unspecified: Secondary | ICD-10-CM | POA: Diagnosis present

## 2018-09-11 DIAGNOSIS — I2510 Atherosclerotic heart disease of native coronary artery without angina pectoris: Secondary | ICD-10-CM | POA: Insufficient documentation

## 2018-09-11 DIAGNOSIS — I129 Hypertensive chronic kidney disease with stage 1 through stage 4 chronic kidney disease, or unspecified chronic kidney disease: Secondary | ICD-10-CM | POA: Diagnosis not present

## 2018-09-11 DIAGNOSIS — I169 Hypertensive crisis, unspecified: Secondary | ICD-10-CM

## 2018-09-11 DIAGNOSIS — E785 Hyperlipidemia, unspecified: Secondary | ICD-10-CM | POA: Diagnosis present

## 2018-09-11 DIAGNOSIS — R4182 Altered mental status, unspecified: Secondary | ICD-10-CM | POA: Insufficient documentation

## 2018-09-11 DIAGNOSIS — I519 Heart disease, unspecified: Secondary | ICD-10-CM | POA: Diagnosis present

## 2018-09-11 DIAGNOSIS — R7989 Other specified abnormal findings of blood chemistry: Secondary | ICD-10-CM | POA: Diagnosis not present

## 2018-09-11 DIAGNOSIS — Z9114 Patient's other noncompliance with medication regimen: Secondary | ICD-10-CM | POA: Diagnosis not present

## 2018-09-11 DIAGNOSIS — I422 Other hypertrophic cardiomyopathy: Secondary | ICD-10-CM

## 2018-09-11 DIAGNOSIS — I5189 Other ill-defined heart diseases: Secondary | ICD-10-CM | POA: Diagnosis present

## 2018-09-11 DIAGNOSIS — N1831 Chronic kidney disease, stage 3a: Secondary | ICD-10-CM | POA: Diagnosis present

## 2018-09-11 LAB — TROPONIN I (HIGH SENSITIVITY)
Troponin I (High Sensitivity): 22 ng/L — ABNORMAL HIGH (ref <18)
Troponin I (High Sensitivity): 24 ng/L — ABNORMAL HIGH (ref <18)
Troponin I (High Sensitivity): 24 ng/L — ABNORMAL HIGH (ref <18)

## 2018-09-11 LAB — CBC
HCT: 42.9 % (ref 36.0–46.0)
Hemoglobin: 13.9 g/dL (ref 12.0–15.0)
MCH: 31.1 pg (ref 26.0–34.0)
MCHC: 32.4 g/dL (ref 30.0–36.0)
MCV: 96 fL (ref 80.0–100.0)
Platelets: 284 10*3/uL (ref 150–400)
RBC: 4.47 MIL/uL (ref 3.87–5.11)
RDW: 13.3 % (ref 11.5–15.5)
WBC: 7.1 10*3/uL (ref 4.0–10.5)
nRBC: 0 % (ref 0.0–0.2)

## 2018-09-11 LAB — COMPREHENSIVE METABOLIC PANEL
ALT: 14 U/L (ref 0–44)
AST: 15 U/L (ref 15–41)
Albumin: 3.9 g/dL (ref 3.5–5.0)
Alkaline Phosphatase: 72 U/L (ref 38–126)
Anion gap: 10 (ref 5–15)
BUN: 17 mg/dL (ref 8–23)
CO2: 24 mmol/L (ref 22–32)
Calcium: 9.4 mg/dL (ref 8.9–10.3)
Chloride: 105 mmol/L (ref 98–111)
Creatinine, Ser: 1.23 mg/dL — ABNORMAL HIGH (ref 0.44–1.00)
GFR calc Af Amer: 53 mL/min — ABNORMAL LOW (ref >60)
GFR calc non Af Amer: 45 mL/min — ABNORMAL LOW (ref >60)
Glucose, Bld: 127 mg/dL — ABNORMAL HIGH (ref 70–99)
Potassium: 3.6 mmol/L (ref 3.5–5.1)
Sodium: 139 mmol/L (ref 135–145)
Total Bilirubin: 0.7 mg/dL (ref 0.3–1.2)
Total Protein: 7.6 g/dL (ref 6.5–8.1)

## 2018-09-11 LAB — RAPID URINE DRUG SCREEN, HOSP PERFORMED
Amphetamines: NOT DETECTED
Barbiturates: NOT DETECTED
Benzodiazepines: NOT DETECTED
Cocaine: NOT DETECTED
Opiates: NOT DETECTED
Tetrahydrocannabinol: NOT DETECTED

## 2018-09-11 LAB — URINALYSIS, ROUTINE W REFLEX MICROSCOPIC
Bilirubin Urine: NEGATIVE
Glucose, UA: NEGATIVE mg/dL
Hgb urine dipstick: NEGATIVE
Ketones, ur: NEGATIVE mg/dL
Leukocytes,Ua: NEGATIVE
Nitrite: NEGATIVE
Protein, ur: 100 mg/dL — AB
Specific Gravity, Urine: 1.021 (ref 1.005–1.030)
pH: 5 (ref 5.0–8.0)

## 2018-09-11 LAB — SARS CORONAVIRUS 2 BY RT PCR (HOSPITAL ORDER, PERFORMED IN ~~LOC~~ HOSPITAL LAB): SARS Coronavirus 2: NEGATIVE

## 2018-09-11 LAB — ECHOCARDIOGRAM COMPLETE
Height: 68 in
Weight: 3595.2 oz

## 2018-09-11 LAB — WET PREP, GENITAL
Clue Cells Wet Prep HPF POC: NONE SEEN
Sperm: NONE SEEN
Trich, Wet Prep: NONE SEEN
Yeast Wet Prep HPF POC: NONE SEEN

## 2018-09-11 MED ORDER — HYDROXYZINE HCL 25 MG PO TABS
25.0000 mg | ORAL_TABLET | Freq: Four times a day (QID) | ORAL | Status: DC
  Administered 2018-09-11 – 2018-09-13 (×7): 25 mg via ORAL
  Filled 2018-09-11 (×7): qty 1

## 2018-09-11 MED ORDER — LABETALOL HCL 5 MG/ML IV SOLN
20.0000 mg | Freq: Once | INTRAVENOUS | Status: AC
  Administered 2018-09-11: 20 mg via INTRAVENOUS
  Filled 2018-09-11: qty 4

## 2018-09-11 MED ORDER — CLONIDINE HCL 0.1 MG PO TABS
0.1000 mg | ORAL_TABLET | Freq: Once | ORAL | Status: AC
  Administered 2018-09-11: 0.1 mg via ORAL
  Filled 2018-09-11: qty 1

## 2018-09-11 MED ORDER — ONDANSETRON HCL 4 MG/2ML IJ SOLN: 4.0000 mg | Freq: Four times a day (QID) | INTRAMUSCULAR | Status: DC | PRN

## 2018-09-11 MED ORDER — HYDRALAZINE HCL 50 MG PO TABS
100.0000 mg | ORAL_TABLET | Freq: Once | ORAL | Status: AC
  Administered 2018-09-11: 100 mg via ORAL
  Filled 2018-09-11: qty 2

## 2018-09-11 MED ORDER — HEPARIN SODIUM (PORCINE) 5000 UNIT/ML IJ SOLN
5000.0000 [IU] | Freq: Three times a day (TID) | INTRAMUSCULAR | Status: DC
  Administered 2018-09-11 – 2018-09-13 (×5): 5000 [IU] via SUBCUTANEOUS
  Filled 2018-09-11 (×5): qty 1

## 2018-09-11 MED ORDER — HYDRALAZINE HCL 100 MG PO TABS: 100.0000 mg | ORAL_TABLET | Freq: Three times a day (TID) | ORAL | Status: DC

## 2018-09-11 MED ORDER — ACETAMINOPHEN 325 MG PO TABS
650.0000 mg | ORAL_TABLET | Freq: Four times a day (QID) | ORAL | Status: DC | PRN
  Administered 2018-09-12: 650 mg via ORAL
  Filled 2018-09-11: qty 2

## 2018-09-11 MED ORDER — TRIAMTERENE-HCTZ 37.5-25 MG PO TABS: 1.0000 | ORAL_TABLET | Freq: Every day | ORAL | Status: DC

## 2018-09-11 MED ORDER — FAMOTIDINE 20 MG PO TABS
20.0000 mg | ORAL_TABLET | Freq: Every day | ORAL | Status: DC
  Administered 2018-09-11 – 2018-09-12 (×2): 20 mg via ORAL
  Filled 2018-09-11 (×2): qty 1

## 2018-09-11 MED ORDER — ISOSORBIDE MONONITRATE ER 60 MG PO TB24
90.0000 mg | ORAL_TABLET | Freq: Every day | ORAL | Status: DC
  Administered 2018-09-11 – 2018-09-12 (×2): 90 mg via ORAL
  Filled 2018-09-11 (×2): qty 1

## 2018-09-11 MED ORDER — ACETAMINOPHEN 650 MG RE SUPP: 650.0000 mg | Freq: Four times a day (QID) | RECTAL | Status: DC | PRN

## 2018-09-11 MED ORDER — CLONIDINE HCL 0.1 MG PO TABS
0.1000 mg | ORAL_TABLET | Freq: Two times a day (BID) | ORAL | Status: DC
  Administered 2018-09-11 – 2018-09-12 (×2): 0.1 mg via ORAL
  Filled 2018-09-11 (×2): qty 1

## 2018-09-11 MED ORDER — CARVEDILOL 25 MG PO TABS
25.0000 mg | ORAL_TABLET | Freq: Two times a day (BID) | ORAL | Status: DC
  Administered 2018-09-11 – 2018-09-12 (×2): 25 mg via ORAL
  Filled 2018-09-11 (×2): qty 1

## 2018-09-11 MED ORDER — HYDRALAZINE HCL 50 MG PO TABS
100.0000 mg | ORAL_TABLET | Freq: Three times a day (TID) | ORAL | Status: DC
  Administered 2018-09-11 – 2018-09-12 (×2): 100 mg via ORAL
  Filled 2018-09-11 (×4): qty 2

## 2018-09-11 MED ORDER — SENNOSIDES-DOCUSATE SODIUM 8.6-50 MG PO TABS: 1.0000 | ORAL_TABLET | Freq: Every evening | ORAL | Status: DC | PRN

## 2018-09-11 MED ORDER — ONDANSETRON HCL 4 MG PO TABS: 4.0000 mg | ORAL_TABLET | Freq: Four times a day (QID) | ORAL | Status: DC | PRN
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
## 2018-09-11 NOTE — ED Notes (Signed)
Pt used BR before being roomed in rm 14, so writer was unable to collect a urine specimen at this time. Pt is aware a urine specimen is needed.

## 2018-09-11 NOTE — ED Provider Notes (Signed)
Received patient at signout from [facility].  Refer to provider note for full history and physical examination.  Briefly, patient is a 68 year old female with history of schizophrenia, coronary artery disease, CKD, ventricular hypertrophy presenting for evaluation of left breast pain and "my water broke ".  She has new T wave inversions on her EKG.  Also found to be quite hypertensive, received 2 doses of IV labetalol, pending home medication reconciliation.  Will need delta troponin, possible cardiology consultation and admission for further evaluation.   Physical Exam  BP (!) 218/155 (BP Location: Left Arm)   Pulse 84   Temp (!) 97.4 F (36.3 C) (Oral)   Resp 17   Ht 5\' 8"  (1.727 m)   Wt 101.9 kg   LMP 05/10/2013   SpO2 100%   BMI 34.17 kg/m   Physical Exam Vitals signs and nursing note reviewed.  Constitutional:      General: She is not in acute distress.    Appearance: She is well-developed.     Comments: Sleeping, easily arousable  HENT:     Head: Normocephalic and atraumatic.  Eyes:     General:        Right eye: No discharge.        Left eye: No discharge.     Conjunctiva/sclera: Conjunctivae normal.  Neck:     Vascular: No JVD.     Trachea: No tracheal deviation.  Cardiovascular:     Rate and Rhythm: Normal rate.  Pulmonary:     Effort: Pulmonary effort is normal.  Abdominal:     General: There is no distension.  Skin:    General: Skin is warm and dry.     Findings: No erythema.  Neurological:     Mental Status: She is alert.  Psychiatric:        Behavior: Behavior normal.       MDM  High-sensitivity troponin increased from 22 to 24.  Spoke with Dr. Percival Spanish with cardiology inconsultation who recommends hospitalist admission and cardiology consultation for evaluation of her hypertension, EKG changes, left-sided chest pains.  Patient received a dose of clonidine and hydralazine with some improvement in her blood pressure.  Patient resting comfortably, denies  any chest pain at this time but states that when she was experiencing it it was a "soreness" on the left side.  Spoke with Dr. Horris Latino with [facility] who agrees to assume care of patient and bring her into the hospital for further evaluation and management.      Renita Papa, PA-C 09/11/18 1630    Daleen Bo, MD 09/11/18 1806

## 2018-09-11 NOTE — ED Notes (Signed)
ED PA at bedside

## 2018-09-11 NOTE — ED Notes (Signed)
Spoke with Erin Riley for 4W, regarding pts IVC status.  Pts IVC paperwork has been received from magistrate, still needs to be served to pt.  Floor made aware that pt will be transported to floor before pt is served paperwork.  Safety sitter orders placed for pt, per protocol.

## 2018-09-11 NOTE — H&P (Signed)
History and Physical  Erin LoraMae H Uresti UXL:[id number] DOB: 31-Jan-1951 DOA: 09/11/2018  Patient coming from: Home & is able to ambulate  Chief Complaint: Left-sided breast pain  HPI: Erin Riley is a 68 y.o. female with medical history significant for paranoid schizophrenia, CAD, CKD, Hypertension, presents to the ED complaining that she is here in the ED because her water broke and she is having contractions, about to have a baby.  Patient has been psychotic, paranoid and mentally unstable.  Patient was found talking to herself, being somewhat aggressive and yelling at staff members.  Patient reports some left-sided breast pain, unable to characterize the pain further.  Patient stated she has not been taking her medication for the last couple of weeks because someone has been harassing her.  Patient is a very poor historian likely due to her psychosis.  ED Course: Patient noted to be in hypertensive crisis, troponins noted to be mildly elevated with some new T wave changes on her EKG. cardiology was consulted.  Patient initially refusing to be admitted, had to be IVC'ed.  UDS negative, CT head no acute intracranial abnormalities.  Patient admitted for further management  Review of Systems: Review of systems are otherwise negative   Past Medical History:  Diagnosis Date  . Chronic back pain   . Chronic kidney disease (CKD), stage II (mild)    Class I-II  . Coronary artery disease 04/2009   50% stenosis in the perforator of LAD; catheterization was for an abnormal Myoview in January 2000 showing anterior and inferolateral ischemia.  . Diverticulitis   . 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 > 3 subsequent Echos show improved/normal EF with moderate to severe concentric LVH and diastolic dysfunction with LVOT/intracavitary gradient --> 06/2016: Severe LVH.  Vigorous EF, 65-70%.?? Gr 1 DD. Mild AS.  [name].  Hyperlipidemia   . Hypertension   . Hypertensive hypertrophic cardiomyopathy: NYHA class II:  Echo: Severe concentric LVH with LV OT gradient; essentially preserved EF with diastolic dysfunction 02/15/2013   Echo 06/2016: Severe Concentric LVH. Vigorous EF 65-70%. ~ Gr I DD.   . Mild aortic stenosis by prior echocardiography    Echo 06/2016: Mild AS (Mean Gradient 15 mmHg); has had prior Mod-Severe MR (not seen on current echo)  . PAD (peripheral artery disease) [facility](HCC) March 2013   Lower extremity Dopplers: R. SFA 50-60%, R. PTA proximally occluded with distal reconstitution;; L. common iliac ~50%, L. SFA 50-70% stenosis, L. PTA < 50%  . Schizophrenia [facility](HCC)    Past Surgical History:  Procedure Laterality Date  . BUNIONECTOMY    . carotid doppler  05/29/2011   left bulb/prox ICA moderate amtfibrous plaque with no evidence significant reduction.,right bulb /proximal ICA normal patency  . lower extremity doppler  05/29/2011   right SFA 50% to 59% diameter reduction,right posterior tibal atreery occlusive disease,reconstituting distally, left common illiac<50%,left SFA 50 to70%,left post. tibial <50%  . NM MYOCAR PERF WALL MOTION  03/2009   Persantine; EF 51%-both anterior and inferolateral ischemia  . TRANSTHORACIC ECHOCARDIOGRAM  06/2016   Severe LVH.  Vigorous EF of 65-70%.  No RWMA. ~Only grade 1 diastolic dysfunction.  Mild aortic stenosis (mean gradient 15 mmHg)  . TRANSTHORACIC ECHOCARDIOGRAM  07/2012   EF 50-55%; severe concentric LVH; only grade 1 diastolic dysfunction. Mild aortic sclerosis - with LVOT /intracavitary gradient of roughly 20 mmHg mean. Mild to moderately dilated LA;; previously reported  MR not seen    Social History:  reports that she quit smoking about 16 years ago. Her smoking use included cigarettes. She has never used smokeless tobacco. She reports that she does not drink alcohol or use drugs.   No Known Allergies  Family History  Problem Relation Age of Onset  .  Hypertension Mother   . Breast cancer Neg Hx       Prior to Admission medications   Medication Sig Start Date End Date Taking? Authorizing Provider  alum & mag hydroxide-simeth (MAALOX/MYLANTA) 200-200-20 MG/5ML suspension Take 30 mLs by mouth every 6 (six) hours as needed for indigestion or heartburn. Patient not taking: Reported on 07/11/2018 04/01/18   Nita Sells, MD  carvedilol (COREG) 25 MG tablet Take 1 tablet (25 mg total) by mouth 2 (two) times daily with a meal. Patient not taking: Reported on 09/11/2018 04/01/18   Nita Sells, MD  cloNIDine (CATAPRES) 0.1 MG tablet Take 0.1 mg by mouth 2 (two) times daily.    [provider]  famotidine (PEPCID) 20 MG tablet Take 1 tablet (20 mg total) by mouth at bedtime. Patient not taking: Reported on 07/11/2018 04/01/18   Nita Sells, MD  hydrALAZINE (APRESOLINE) 100 MG tablet Take 1 tablet (100 mg total) by mouth 3 (three) times daily. For high blood pressure Patient not taking: Reported on 07/11/2018 04/01/18   Nita Sells, MD  hydrOXYzine (ATARAX/VISTARIL) 25 MG tablet Take 1 tablet (25 mg total) by mouth every 6 (six) hours. Patient not taking: Reported on 09/11/2018 04/13/18   Veryl Speak, MD  isosorbide mononitrate (IMDUR) 60 MG 24 hr tablet Take 1.5 tablets (90 mg total) by mouth daily. Patient not taking: Reported on 07/11/2018 04/01/18   Nita Sells, MD  metroNIDAZOLE (FLAGYL) 500 MG tablet Take 1 tablet (500 mg total) by mouth 2 (two) times daily. One po bid x 7 days Patient not taking: Reported on 08/06/2018 07/11/18   Valarie Merino, MD  metroNIDAZOLE (FLAGYL) 500 MG tablet Take 1 tablet (500 mg total) by mouth 2 (two) times daily. Patient not taking: Reported on 08/06/2018 07/11/18   Valarie Merino, MD  pantoprazole (PROTONIX) 40 MG tablet Take 1 tablet (40 mg total) by mouth daily. Patient not taking: Reported on 07/11/2018 04/02/18   Nita Sells, MD  predniSONE (DELTASONE) 10 MG tablet  Take 2 tablets (20 mg total) by mouth 2 (two) times daily with a meal. Patient not taking: Reported on 07/11/2018 04/13/18   Veryl Speak, MD  triamterene-hydrochlorothiazide (MAXZIDE-25) 37.5-25 MG tablet Take 1 tablet by mouth daily.    [provider]    Physical Exam: BP (!) 167/99 (BP Location: Left Arm)   Pulse 76   Temp 97.8 F (36.6 C) (Oral)   Resp 16   Ht 5\' 8"  (1.727 m)   Wt 101.9 kg   LMP 05/10/2013   SpO2 100%   BMI 34.17 kg/m   General: NAD, psychotic Eyes: Normal ENT: Normal Neck: Supple Cardiovascular: S1, S2 presents Respiratory: CTAB Abdomen: Soft, NT, ND, BS present  Skin: Normal Musculoskeletal: No pedal edema bilaterally Psychiatric: Elevated mood, paranoia Neurologic: No focal neurologic deficits noted          Labs on Admission:  Basic Metabolic Panel: Recent Labs  Lab 09/11/18 0627  NA 139  K 3.6  CL 105  CO2 24  GLUCOSE 127*  BUN 17  CREATININE 1.23*  CALCIUM 9.4   Liver Function Tests: Recent Labs  Lab 09/11/18 0627  AST 15  ALT 14  ALKPHOS 72  BILITOT 0.7  PROT 7.6  ALBUMIN 3.9   No results for input(s): LIPASE, AMYLASE in the last 168 hours. No results for input(s): AMMONIA in the last 168 hours. CBC: Recent Labs  Lab 09/11/18 0627  WBC 7.1  HGB 13.9  HCT 42.9  MCV 96.0  PLT 284   Cardiac Enzymes: No results for input(s): CKTOTAL, CKMB, CKMBINDEX, TROPONINI in the last 168 hours.  BNP (last 3 results) Recent Labs    01/09/18 1707  BNP 30.4    ProBNP (last 3 results) No results for input(s): PROBNP in the last 8760 hours.  CBG: No results for input(s): GLUCAP in the last 168 hours.  Radiological Exams on Admission: Dg Chest 2 View  Result Date: 09/11/2018 CLINICAL DATA:  Chest pain. EXAM: CHEST - 2 VIEW COMPARISON:  CT scan and radiographs of April 18, 2018. FINDINGS: Stable cardiomegaly. No pneumothorax or pleural effusion is noted. Both lungs are clear. The visualized skeletal structures are  unremarkable. IMPRESSION: No active cardiopulmonary disease. Electronically Signed   By: Lupita RaiderJames  Green Jr M.D.   On: 09/11/2018 07:10   Ct Head Wo Contrast  Result Date: 09/11/2018 CLINICAL DATA:  Altered mental status. Schizophrenia. EXAM: CT HEAD WITHOUT CONTRAST TECHNIQUE: Contiguous axial images were obtained from the base of the skull through the vertex without intravenous contrast. COMPARISON:  04/18/2018 FINDINGS: Brain: No evidence of acute infarction, hemorrhage, hydrocephalus, extra-axial collection or mass lesion/mass effect. Chronic prominent empty sella. Vascular: No hyperdense vessel or unexpected calcification. Skull: Normal. Negative for fracture or focal lesion. Sinuses/Orbits: Normal. Other: None IMPRESSION: 1. No acute intracranial abnormality. 2. Chronic prominent empty sella. Electronically Signed   By: Francene BoyersJames  Maxwell M.D.   On: 09/11/2018 12:00    EKG: Independently reviewed.  Some T wave changes noted  Assessment/Plan Present on Admission: . Essential hypertension . Obesity (BMI 30-39.9) . Left ventricular diastolic dysfunction, NYHA class 1 . PAD (peripheral artery disease) (HCC) . Hyperlipidemia with target LDL less than 100 . Paranoid schizophrenia (HCC) . CKD (chronic kidney disease) stage 3, GFR 30-59 ml/min (HCC)  Principal Problem:   Hypertensive crisis Active Problems:   Essential hypertension   Paranoid schizophrenia (HCC)   CKD (chronic kidney disease) stage 3, GFR 30-59 ml/min (HCC)   Obesity (BMI 30-39.9)   PAD (peripheral artery disease) (HCC)   Left ventricular diastolic dysfunction, NYHA class 1   Hyperlipidemia with target LDL less than 100   Chest pain  Hypertensive crisis BP noted to be elevated SBP 200s, DBP 100s Noncompliant to medication UDS negative CT head negative for any acute intracranial changes Continue home meds Coreg, clonidine, hydralazine, Imdur Telemetry  Elevated troponin/EKG changes/history of CAD Left-sided breast/chest  pain Troponin mildly elevated EKG with T wave changes Echo with normal EF Cardiology consulted, appreciate recs  CKD stage III Cr at baseline Daily BMP  Paranoid schizophrenia Not taking any meds Psych consulted appreciate recs  Obesity Lifestyle modification advised    DVT prophylaxis: Heparin  Code Status: Full  Family Communication: None at bedside  Disposition Plan: To be determined  Consults called: Cardiology  Admission status: Observation telemetry    Briant CedarNkeiruka J Kalenna Millett MD [facility]   If 7PM-7AM, please contact night-coverage [email or web address]   09/11/2018, 6:32 PM

## 2018-09-11 NOTE — Consult Note (Addendum)
Cardiology Consultation:   Patient ID: Erin Riley MRN: [id number]; DOB: Apr 16, 1950  Admit date: 09/11/2018 Date of Consult: 09/11/2018  Primary Care Provider: Patient, No Pcp Per Primary Cardiologist: Erin Lemmaavid Harding, MD  Primary Electrophysiologist:  None    Patient Profile:   Erin Riley is a 68 y.o. female with a hx of hypertensive hypertrophic cardiomyopathy and mild aortic stenosis, schizophrenia, mod CAD non obstructive on cath 2011 who is being seen today for the evaluation of elevated HS troponin and abnormal EKG at the request of Erin Riley. Effie Riley.  History of Present Illness:   Erin Riley with above hx with cath 04/09/09 with EF 50% ostial LAD septal.  Last echo 2018 with severe LVH, with EF 65-70% no RWMA, G1 DD, mild AS.  On last visit with Erin Riley. Herbie Riley pt did not know what meds she was taking.  She is poor historian.    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    Pt presented to ER today for urinalysis for urinary burning.  She then noted she was having a baby her water had broken.  She did mention lt sided breast pain.  She noted she had not been taking meds.  Because of the Lt breast pain and abnormal EKG troponin   EKG:  The EKG was personally reviewed and demonstrates:  SR  LAE poor R wave progression and T wave inversion V4-6 - when compared to prior EKGs this has been seen before 03/31/18 may be due to LVH Telemetry:  Telemetry was personally reviewed and demonstrates:  SR  Troponin HS 22; 24   Na 139, K+ 3.6 BUN 17 Cr 1.23  LFTs normal.  WBC 7.1 Hgb 13.9 plts 284  COVID neg   BP 216/157 on admit now 167/105   She has rec'd clonidine 0.1 hydralazine 100 mg and labetalol 20 mg  She states her chest pain was when we pressed on chest.  She is tender Lt breast.  But again poor historian.   Heart Pathway Score:    5  Past Medical History:  Diagnosis Date  . Chronic back  pain   . Chronic kidney disease (CKD), stage II (mild)    Class I-II  . Coronary artery disease 04/2009   50% stenosis in the perforator of LAD; catheterization was for an abnormal Myoview in January 2000 showing anterior and inferolateral ischemia.  . Diverticulitis   . 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 > 3 subsequent Echos show improved/normal EF with moderate to severe concentric LVH and diastolic dysfunction with LVOT/intracavitary gradient --> 06/2016: Severe LVH.  Vigorous EF, 65-70%.?? Gr 1 DD. Mild AS.  [name]. History of schizophrenia    However I am not sure about the validity of this. She is not on any medications.  . Hyperlipidemia   . Hypertension   . Hypertensive hypertrophic cardiomyopathy: NYHA class II:  Echo: Severe concentric LVH with LV OT gradient; essentially preserved EF with diastolic dysfunction 02/15/2013   Echo 06/2016: Severe Concentric LVH. Vigorous EF 65-70%. ~ Gr I DD.   . Mild aortic stenosis by prior echocardiography    Echo 06/2016: Mild AS (Mean Gradient 15 mmHg); has had prior Mod-Severe MR (not seen on current echo)  . PAD (peripheral artery disease) [facility](HCC) March 2013   Lower extremity Dopplers:  R. SFA 50-60%, R. PTA proximally occluded with distal reconstitution;; L. common iliac ~50%, L. SFA 50-70% stenosis, L. PTA < 50%  . Schizophrenia ([place])   . Urinary tract infection     Past Surgical History:  Procedure Laterality Date  . BUNIONECTOMY    . carotid doppler  05/29/2011   left bulb/prox ICA moderate amtfibrous plaque with no evidence significant reduction.,right bulb /proximal ICA normal patency  . lower extremity doppler  05/29/2011   right SFA 50% to 59% diameter reduction,right posterior tibal atreery occlusive disease,reconstituting distally, left common illiac<50%,left SFA 50 to70%,left post. tibial <50%  . NM MYOCAR PERF WALL MOTION  03/2009    Persantine; EF 51%-both anterior and inferolateral ischemia  . TRANSTHORACIC ECHOCARDIOGRAM  06/2016   Severe LVH.  Vigorous EF of 65-70%.  No RWMA. ~Only grade 1 diastolic dysfunction.  Mild aortic stenosis (mean gradient 15 mmHg)  . TRANSTHORACIC ECHOCARDIOGRAM  07/2012   EF 50-55%; severe concentric LVH; only grade 1 diastolic dysfunction. Mild aortic sclerosis - with LVOT /intracavitary gradient of roughly 20 mmHg mean. Mild to moderately dilated LA;; previously reported MR not seen     Home Medications:  Prior to Admission medications   Medication Sig Start Date End Date Taking? Authorizing Provider  alum & mag hydroxide-simeth (MAALOX/MYLANTA) 200-200-20 MG/5ML suspension Take 30 mLs by mouth every 6 (six) hours as needed for indigestion or heartburn. Patient not taking: Reported on 07/11/2018 04/01/18   Nita Sells, MD  carvedilol (COREG) 25 MG tablet Take 1 tablet (25 mg total) by mouth 2 (two) times daily with a meal. Patient not taking: Reported on 09/11/2018 04/01/18   Nita Sells, MD  cloNIDine (CATAPRES) 0.1 MG tablet Take 0.1 mg by mouth 2 (two) times daily.    [provider]  famotidine (PEPCID) 20 MG tablet Take 1 tablet (20 mg total) by mouth at bedtime. Patient not taking: Reported on 07/11/2018 04/01/18   Nita Sells, MD  hydrALAZINE (APRESOLINE) 100 MG tablet Take 1 tablet (100 mg total) by mouth 3 (three) times daily. For high blood pressure Patient not taking: Reported on 07/11/2018 04/01/18   Nita Sells, MD  hydrOXYzine (ATARAX/VISTARIL) 25 MG tablet Take 1 tablet (25 mg total) by mouth every 6 (six) hours. Patient not taking: Reported on 09/11/2018 04/13/18   Veryl Speak, MD  isosorbide mononitrate (IMDUR) 60 MG 24 hr tablet Take 1.5 tablets (90 mg total) by mouth daily. Patient not taking: Reported on 07/11/2018 04/01/18   Nita Sells, MD  metroNIDAZOLE (FLAGYL) 500 MG tablet Take 1 tablet (500 mg total) by mouth 2 (two) times  daily. One po bid x 7 days Patient not taking: Reported on 08/06/2018 07/11/18   Valarie Merino, MD  metroNIDAZOLE (FLAGYL) 500 MG tablet Take 1 tablet (500 mg total) by mouth 2 (two) times daily. Patient not taking: Reported on 08/06/2018 07/11/18   Valarie Merino, MD  pantoprazole (PROTONIX) 40 MG tablet Take 1 tablet (40 mg total) by mouth daily. Patient not taking: Reported on 07/11/2018 04/02/18   Nita Sells, MD  predniSONE (DELTASONE) 10 MG tablet Take 2 tablets (20 mg total) by mouth 2 (two) times daily with a meal. Patient not taking: Reported on 07/11/2018 04/13/18   Veryl Speak, MD  triamterene-hydrochlorothiazide (MAXZIDE-25) 37.5-25 MG tablet Take 1 tablet by mouth daily.    [provider]    Inpatient Medications: Scheduled Meds:  Continuous Infusions:  PRN Meds:   Allergies:   No Known Allergies  Social History:  Social History   Socioeconomic History  . Marital status: Single    Spouse name: Not on file  . Number of children: 7  . Years of education: Not on file  . Highest education level: Not on file  Occupational History  . Not on file  Social Needs  . Financial resource strain: Not on file  . Food insecurity    Worry: Not on file    Inability: Not on file  . Transportation needs    Medical: Not on file    Non-medical: Not on file  Tobacco Use  . Smoking status: Former Smoker    Types: Cigarettes    Quit date: 05/14/2002    Years since quitting: 16.3  . Smokeless tobacco: Never Used  Substance and Sexual Activity  . Alcohol use: No  . Drug use: No  . Sexual activity: Yes    Birth control/protection: Post-menopausal  Lifestyle  . Physical activity    Days per week: Not on file    Minutes per session: Not on file  . Stress: Not on file  Relationships  . Social Musician on phone: Not on file    Gets together: Not on file    Attends religious service: Not on file    Active member of club or organization: Not on file     Attends meetings of clubs or organizations: Not on file    Relationship status: Not on file  . Intimate partner violence    Fear of current or ex partner: Not on file    Emotionally abused: Not on file    Physically abused: Not on file    Forced sexual activity: Not on file  Other Topics Concern  . Not on file  Social History Narrative   Now single mother of 2 with one grandchild. She quit smoking roughly 5 years ago and is not so since. She has also stopped drinking alcohol. She does try get routine exercise walking at least a mile 3-4 days a week.    She lives with her 55 year old mother. 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    Family History:    Family History  Problem Relation Age of Onset  . Hypertension Mother   . Breast cancer Neg Hx      ROS:  Pt did not wish to answer questions " I have already told people the answer"  info from chart Please see the history of present illness.  General:no colds or fevers, no weight changes Skin:no rashes or ulcers HEENT:no blurred vision, no congestion CV:see HPI PUL:see HPI GI:no diarrhea constipation or melena, no indigestion GU:no hematuria, no dysuria MS:no joint pain, no claudication Neuro:no syncope, no lightheadedness Endo:no diabetes, no thyroid disease  All other ROS reviewed and negative.     Physical Exam/Data:   Vitals:   09/11/18 1030 09/11/18 1034 09/11/18 1045 09/11/18 1100  BP: (!) 158/95 (!) 158/95  (!) 159/108  Pulse: 72 73 71 71  Resp: Temp:      TempSrc:      SpO2: 100% 100% 100% 99%  Weight:      Height:       No intake or output data in the 24 hours ending 09/11/18 1206 Last 3 Weights 09/11/2018 09/10/2018 08/06/2018  Weight (lbs) 224 lb 11.2 oz 229 lb 212 lb  Weight (kg) 101.923 kg 103.874 kg 96.163 kg  Some encounter information is confidential and restricted. Go to Review Flowsheets activity to see  all data.     Body mass index is 34.17 kg/m.  General:  Well nourished, well developed, in  no acute distress HEENT: normal Lymph: no adenopathy Neck: no JVD Endocrine:  No thryomegaly Vascular: No carotid bruits; pedal pulses 2+ bilaterally  Cardiac:  normal S1, S2; RRR; 2/6 systolic murmur no gallup or rub Lungs:  clear to auscultation bilaterally, no wheezing, rhonchi or rales  Abd: soft, nontender, no hepatomegaly  Ext: no edema Musculoskeletal:  No deformities, BUE and BLE strength normal and equal Skin: warm and dry  Neuro:  Alert and oriented X 3 , talks out loud without talking to me on exam,  no focal abnormalities noted Psych:  Normal affect though agitated with many questions.  Relevant CV Studies: Echo 07/03/2016 Study Conclusions  - Left ventricle: The cavity size was normal. Wall thickness was   increased in a pattern of severe LVH. Systolic function was   vigorous. The estimated ejection fraction was in the range of 65%   to 70%. Wall motion was normal; there were no regional wall   motion abnormalities. Doppler parameters are consistent with   abnormal left ventricular relaxation (grade 1 diastolic   dysfunction). Doppler parameters are consistent with high   ventricular filling pressure. - Aortic valve: Valve mobility was restricted. There was mild   stenosis. - Mitral valve: Calcified annulus.  Impressions:  - Normal LV systolic function; severe LVH; mild diastolic   dysfunction with elevated LV filling pressure; calcified aortic   valve with mild AS (mean gradient 15 mmHg).    Laboratory Data:  High Sensitivity Troponin:   Recent Labs  Lab 09/11/18 0627 09/11/18 0906  TROPONINIHS 22.0* 24.0*     Cardiac EnzymesNo results for input(s): TROPONINI in the last 168 hours. No results for input(s): TROPIPOC in the last 168 hours.  Chemistry Recent Labs  Lab 09/11/18 0627  NA 139  K 3.6  CL 105  CO2 24  GLUCOSE 127*  BUN 17  CREATININE 1.23*  CALCIUM 9.4  GFRNONAA 45*  GFRAA 53*  ANIONGAP 10    Recent Labs  Lab 09/11/18 0627   PROT 7.6  ALBUMIN 3.9  AST 15  ALT 14  ALKPHOS 72  BILITOT 0.7   Hematology Recent Labs  Lab 09/11/18 0627  WBC 7.1  RBC 4.47  HGB 13.9  HCT 42.9  MCV 96.0  MCH 31.1  MCHC 32.4  RDW 13.3  PLT 284   BNPNo results for input(s): BNP, PROBNP in the last 168 hours.  DDimer No results for input(s): DDIMER in the last 168 hours.   Radiology/Studies:  Dg Chest 2 View  Result Date: 09/11/2018 CLINICAL DATA:  Chest pain. EXAM: CHEST - 2 VIEW COMPARISON:  CT scan and radiographs of April 18, 2018. FINDINGS: Stable cardiomegaly. No pneumothorax or pleural effusion is noted. Both lungs are clear. The visualized skeletal structures are unremarkable. IMPRESSION: No active cardiopulmonary disease. Electronically Signed   By: Lupita RaiderJames  Green Jr M.D.   On: 09/11/2018 07:10   Ct Head Wo Contrast  Result Date: 09/11/2018 CLINICAL DATA:  Altered mental status. Schizophrenia. EXAM: CT HEAD WITHOUT CONTRAST TECHNIQUE: Contiguous axial images were obtained from the base of the skull through the vertex without intravenous contrast. COMPARISON:  04/18/2018 FINDINGS: Brain: No evidence of acute infarction, hemorrhage, hydrocephalus, extra-axial collection or mass lesion/mass effect. Chronic prominent empty sella. Vascular: No hyperdense vessel or unexpected calcification. Skull: Normal. Negative for fracture or focal lesion. Sinuses/Orbits: Normal. Other: None IMPRESSION: 1. No  acute intracranial abnormality. 2. Chronic prominent empty sella. Electronically Signed   By: Francene BoyersJames  Maxwell M.D.   On: 09/11/2018 12:00    Assessment and Plan:   Hypertension uncontrolled- she may have missed her meds from her account and BP elevation could be due to missing meds.    Resume home meds, should be admitted to control and further eval CAD.  Will repeat echo.  Will defer to Erin Riley. Antoine PocheHochrein on need for heparin.   Elevated troponin most likely due to HTN and her hx of LVH  Would repeat troponin in 6 hours    hypertrophic  cardiomyopathy with LVH - EKG is abnormal but has had this appearance in past   Non obstructive CAD on cath 2011   Schizophrenia per IM       For questions or updates, please contact [facility] Please consult [email or web address] for contact info under    Signed, Nada BoozerLaura Ingold, NP  09/11/2018 12:06 PM  History and all data above reviewed.  Patient examined.  I agree with the findings as above.   The patient presented for vague reasons.  She currently is refusing to answer my questions.  There is a mildly elevated hs Trop.  She has no acute changes on an EKG with LVH and repol.  To me she denies any pain and she just wants to go home.  She is not reporting chest pain.  The patient exam reveals COR:RRR, systolic murmur out the aortic outflow tract  ,  Lungs: Clear  ,  Abd: Positive bowel sounds, no rebound no guarding, Ext No edema  .  All available labs, radiology testing, previous records reviewed. Agree with documented assessment and plan. Elevated trop:  This is consistent with her LVH and hypertension on presentation.  It does not suggest an acute coronary syndrome.  It is really not possible to calculate the HEAR score as she cannot provide any details.  However, based on the flat trop and other reasons for the hs Trop to be high I will not suggest further cardiac work up.  HCM:  While she is here we could get an echo for follow up but this could also be done as an outpatient.  HTN:  She agrees to take her HTN meds if we refill the prescriptions that she does not have.  Continue out patient therapy.   Fayrene FearingJames Tarrin Lebow  12:55 PM  09/11/2018

## 2018-09-11 NOTE — Progress Notes (Signed)
CSW called pt's RN who confirmed pt was IVC's by [employer] and RN will make four copies of the papers served (IVC) and place them into the chart.  Please reconsult if future social work needs arise.  CSW signing off, as social work intervention is no longer needed.  Alphonse Guild. Alex Leahy, LCSW, LCAS, CSI Transitions of Care Clinical Social Worker Care Coordination Department Ph: [phone]

## 2018-09-11 NOTE — ED Triage Notes (Signed)
[facility]. Pt is complaining of burning. Patient has been called several occurrence and not responded or left the emergency department lobby.

## 2018-09-11 NOTE — ED Notes (Signed)
Another RN entered room to transport pt upstairs, pt refusing to be transported upstairs, stating that she is going home.  Pt is heard screaming from room.  Admitting provider, Horris Latino, paged.  Awaiting response.

## 2018-09-11 NOTE — ED Notes (Signed)
Floor RN, Archer Asa, informed of delay in transfer of care due to pts possible impending IVC status.

## 2018-09-11 NOTE — ED Notes (Signed)
Pt seen to be speaking, at times shouting, while no one else is in the room.  This RN entered room and asked to whom the patient was speaking, to which she replied that she was just thinking out loud.  Will continue to monitor.

## 2018-09-11 NOTE — ED Notes (Signed)
Per CT tech, pt reporting seeing white cat in her room.  Pt remains lying in bed.  Will continue to monitor.

## 2018-09-11 NOTE — ED Notes (Signed)
Pt in XR. 

## 2018-09-11 NOTE — ED Notes (Signed)
Pt provided with turkey sandwich and water

## 2018-09-11 NOTE — Progress Notes (Signed)
CSW was consulted to assist with IVC process. CSW completed IVC paperwork and faxed it over the the magistrates office.   Golden Circle, LCSW Transitions of Care Department [facility] ED [phone]

## 2018-09-11 NOTE — ED Notes (Signed)
ED TO INPATIENT HANDOFF REPORT  Name/Age/Gender Erin Riley 68 y.o. female  Code Status Code Status History    Date Active Date Inactive Code Status Order ID Comments User Context   08/06/2018 1537 08/06/2018 1920 Full Code [id number]  Concepcion LivingJoy, Shawn C, PA-C ED   04/18/2018 2028 04/19/2018 1844 Full Code [id number]  Delia ChimesMurray, Alyssa B, PA-C ED   03/31/2018 1903 04/01/2018 1411 Full Code [id number]  Ike BenePark, Carolyn J, MD ED   09/14/2017 2155 09/15/2017 1509 Full Code [id number]  Melene PlanFloyd, Dan, DO ED   03/31/2017 2149 04/05/2017 2131 Full Code [id number]  Delila Pereyrakonkwo, Justina A, NP Inpatient   03/29/2017 1331 03/31/2017 2131 Full Code [id number]  Marily MemosMesner, Jason, MD ED   08/14/2016 1138 08/15/2016 1950 Full Code [id number]  Cathren LaineSteinl, Kevin, MD ED   06/09/2016 0251 06/09/2016 2205 Full Code [id number]  Gilda CreasePollina, [name] J, MD ED   03/25/2015 1501 03/26/2015 1750 Full Code [id number]  Cheri Fowlerose, Kayla, PA-C ED   11/28/2014 1443 11/30/2014 1508 Full Code [id number]  Lonia BloodMcClung, Jeffrey T, MD Inpatient   09/24/2014 1351 09/28/2014 2036 Full Code [id number]  Tia AlertJones, David S, MD Inpatient   07/31/2012 2259 08/02/2012 1823 Full Code [id number]  Doree AlbeeNewton, Steven, MD ED   Advance Care Planning Activity      Home/SNF/Other Home  Chief Complaint Vaginal Issues  Level of Care/Admitting Diagnosis ED Disposition    ED Disposition Condition Comment   Admit  [facility] [[id number]]  Level of Care: Telemetry [5]  Admit to tele based on following criteria: Monitor for Ischemic changes  Covid Evaluation: Confirmed COVID Negative  Diagnosis: Hypertensive crisis [025427][[id number]]  Admitting Physician: Briant CedarEZENDUKA, NKEIRUKA J [[id number]]  Attending Physician: Briant CedarEZENDUKA, NKEIRUKA J [[id number]]  PT Class (Do Not Modify): Observation [104]  PT Acc Code (Do Not Modify): Observation [10022]       Medical History Past Medical History:  Diagnosis Date  . Chronic back pain   . Chronic kidney disease (CKD), stage II (mild)    Class I-II  . Coronary  artery disease 04/2009   50% stenosis in the perforator of LAD; catheterization was for an abnormal Myoview in January 2000 showing anterior and inferolateral ischemia.  . Diverticulitis   . 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 > 3 subsequent Echos show improved/normal EF with moderate to severe concentric LVH and diastolic dysfunction with LVOT/intracavitary gradient --> 06/2016: Severe LVH.  Vigorous EF, 65-70%.?? Gr 1 DD. Mild AS.  [name]. Hyperlipidemia   . Hypertension   . Hypertensive hypertrophic cardiomyopathy: NYHA class II:  Echo: Severe concentric LVH with LV OT gradient; essentially preserved EF with diastolic dysfunction 02/15/2013   Echo 06/2016: Severe Concentric LVH. Vigorous EF 65-70%. ~ Gr I DD.   . Mild aortic stenosis by prior echocardiography    Echo 06/2016: Mild AS (Mean Gradient 15 mmHg); has had prior Mod-Severe MR (not seen on current echo)  . PAD (peripheral artery disease) [facility](HCC) March 2013   Lower extremity Dopplers: R. SFA 50-60%, R. PTA proximally occluded with distal reconstitution;; L. common iliac ~50%, L. SFA 50-70% stenosis, L. PTA < 50%  . Schizophrenia (HCC)     Allergies No Known Allergies  IV Location/Drains/Wounds Patient Lines/Drains/Airways Status   Active Line/Drains/Airways    Name:   Placement date:   Placement time:   Site:   Days:   Peripheral IV 09/11/18 Right Antecubital   09/11/18  0745    Antecubital   less than 1          Labs/Imaging Results for orders placed or performed during the hospital encounter of 09/11/18 (from the past 48 hour(s))  Wet prep, genital     Status: Abnormal   Collection Time: 09/11/18  6:06 AM  Result Value Ref Range   Yeast Wet Prep HPF POC NONE SEEN NONE SEEN   Trich, Wet Prep NONE SEEN NONE SEEN   Clue Cells Wet Prep HPF POC NONE SEEN NONE SEEN   WBC, Wet Prep HPF POC MANY (A) NONE SEEN   Sperm NONE SEEN      Comment: Performed at [facility], [address] [place], [place] [address]  CBC     Status: None   Collection Time: 09/11/18  6:27 AM  Result Value Ref Range   WBC 7.1 4.0 - 10.5 K/uL   RBC 4.47 3.87 - 5.11 MIL/uL   Hemoglobin 13.9 12.0 - 15.0 g/dL   HCT 29.542.9 62.136.0 - 30.846.0 %   MCV 96.0 80.0 - 100.0 fL   MCH 31.1 26.0 - 34.0 pg   MCHC 32.4 30.0 - 36.0 g/dL   RDW 65.713.3 84.611.5 - 96.215.5 %   Platelets 284 150 - 400 K/uL   nRBC 0.0 0.0 - 0.2 %    Comment: Performed at [facility], [address] [place], [place] [address]  Comprehensive metabolic panel     Status: Abnormal   Collection Time: 09/11/18  6:27 AM  Result Value Ref Range   Sodium 139 135 - 145 mmol/L   Potassium 3.6 3.5 - 5.1 mmol/L   Chloride 105 98 - 111 mmol/L   CO2 24 22 - 32 mmol/L   Glucose, Bld 127 (H) 70 - 99 mg/dL   BUN 17 8 - 23 mg/dL   Creatinine, Ser 1.321.23 (H) 0.44 - 1.00 mg/dL   Calcium 9.4 8.9 - 44.010.3 mg/dL   Total Protein 7.6 6.5 - 8.1 g/dL   Albumin 3.9 3.5 - 5.0 g/dL   AST 15 15 - 41 U/L   ALT 14 0 - 44 U/L   Alkaline Phosphatase 72 38 - 126 U/L   Total Bilirubin 0.7 0.3 - 1.2 mg/dL   GFR calc non Af Amer 45 (L) >60 mL/min   GFR calc Af Amer 53 (L) >60 mL/min   Anion gap 10 5 - 15    Comment: Performed at [facility], [address] [place], [place] [address]  Troponin I (High Sensitivity)     Status: Abnormal   Collection Time: 09/11/18  6:27 AM  Result Value Ref Range   Troponin I (High Sensitivity) 22.0 (H) <18 ng/L    Comment: (NOTE) Elevated high sensitivity troponin I (hsTnI) values and significant  changes across serial measurements may suggest ACS but many other  chronic and acute conditions are known to elevate hsTnI results.  Refer to the "Links" section for chest pain algorithms and additional  guidance. Performed at [facility], [address] [place], [place] [address]   Urinalysis, Routine w reflex  microscopic- may I&O cath if menses     Status: Abnormal   Collection Time: 09/11/18  7:21 AM  Result Value Ref Range   Color, Urine YELLOW YELLOW   APPearance CLEAR CLEAR   Specific Gravity, Urine 1.021 1.005 - 1.030   pH 5.0 5.0 - 8.0   Glucose, UA NEGATIVE NEGATIVE mg/dL   Hgb urine  dipstick NEGATIVE NEGATIVE   Bilirubin Urine NEGATIVE NEGATIVE   Ketones, ur NEGATIVE NEGATIVE mg/dL   Protein, ur 161100 (A) NEGATIVE mg/dL   Nitrite NEGATIVE NEGATIVE   Leukocytes,Ua NEGATIVE NEGATIVE   RBC / HPF 0-5 0 - 5 RBC/hpf   WBC, UA 0-5 0 - 5 WBC/hpf   Bacteria, UA RARE (A) NONE SEEN   Squamous Epithelial / LPF 0-5 0 - 5   Mucus PRESENT     Comment: Performed at [facility], [address] [place], [place] [address]  Urine rapid drug screen (hosp performed)     Status: None   Collection Time: 09/11/18  7:31 AM  Result Value Ref Range   Opiates NONE DETECTED NONE DETECTED   Cocaine NONE DETECTED NONE DETECTED   Benzodiazepines NONE DETECTED NONE DETECTED   Amphetamines NONE DETECTED NONE DETECTED   Tetrahydrocannabinol NONE DETECTED NONE DETECTED   Barbiturates NONE DETECTED NONE DETECTED    Comment: (NOTE) DRUG SCREEN FOR MEDICAL PURPOSES ONLY.  IF CONFIRMATION IS NEEDED FOR ANY PURPOSE, NOTIFY LAB WITHIN 5 DAYS. LOWEST DETECTABLE LIMITS FOR URINE DRUG SCREEN Drug Class                     Cutoff (ng/mL) Amphetamine and metabolites    1000 Barbiturate and metabolites    200 Benzodiazepine                 200 Tricyclics and metabolites     300 Opiates and metabolites        300 Cocaine and metabolites        300 THC                            50 Performed at [facility], [address] [place], [place] [address]   SARS Coronavirus 2 (CEPHEID - Performed in [facility] hospital lab), Hosp Order     Status: None   Collection Time: 09/11/18  8:30 AM   Specimen: Nasopharyngeal Swab  Result Value Ref Range   SARS Coronavirus 2 NEGATIVE  NEGATIVE    Comment: (NOTE) If result is NEGATIVE SARS-CoV-2 target nucleic acids are NOT DETECTED. The SARS-CoV-2 RNA is generally detectable in upper and lower  respiratory specimens during the acute phase of infection. The lowest  concentration of SARS-CoV-2 viral copies this assay can detect is 250  copies / mL. A negative result does not preclude SARS-CoV-2 infection  and should not be used as the sole basis for treatment or other  patient management decisions.  A negative result may occur with  improper specimen collection / handling, submission of specimen other  than nasopharyngeal swab, presence of viral mutation(s) within the  areas targeted by this assay, and inadequate number of viral copies  (<250 copies / mL). A negative result must be combined with clinical  observations, patient history, and epidemiological information. If result is POSITIVE SARS-CoV-2 target nucleic acids are DETECTED. The SARS-CoV-2 RNA is generally detectable in upper and lower  respiratory specimens dur ing the acute phase of infection.  Positive  results are indicative of active infection with SARS-CoV-2.  Clinical  correlation with patient history and other diagnostic information is  necessary to determine patient infection status.  Positive results do  not rule out bacterial infection or co-infection with other viruses. If result is PRESUMPTIVE POSTIVE SARS-CoV-2 nucleic acids MAY BE PRESENT.   A presumptive positive result was obtained on  the submitted specimen  and confirmed on repeat testing.  While 2019 novel coronavirus  (SARS-CoV-2) nucleic acids may be present in the submitted sample  additional confirmatory testing may be necessary for epidemiological  and / or clinical management purposes  to differentiate between  SARS-CoV-2 and other Sarbecovirus currently known to infect humans.  If clinically indicated additional testing with an alternate test  methodology [phone]) is advised. The  SARS-CoV-2 RNA is generally  detectable in upper and lower respiratory sp ecimens during the acute  phase of infection. The expected result is Negative. Fact Sheet for Patients:  BoilerBrush.com.cy Fact Sheet for Healthcare Providers: https://pope.com/ This test is not yet approved or cleared by the [place] FDA and has been authorized for detection and/or diagnosis of SARS-CoV-2 by FDA under an Emergency Use Authorization (EUA).  This EUA will remain in effect (meaning this test can be used) for the duration of the COVID-19 declaration under Section 564(b)(1) of the Act, 21 U.S.C. section 360bbb-3(b)(1), unless the authorization is terminated or revoked sooner. Performed at [facility], [address] [place], [place] [address]   Troponin I (High Sensitivity)     Status: Abnormal   Collection Time: 09/11/18  9:06 AM  Result Value Ref Range   Troponin I (High Sensitivity) 24.0 (H) <18 ng/L    Comment: (NOTE) Elevated high sensitivity troponin I (hsTnI) values and significant  changes across serial measurements may suggest ACS but many other  chronic and acute conditions are known to elevate hsTnI results.  Refer to the "Links" section for chest pain algorithms and additional  guidance. Performed at [facility], [address] [place], [place] [address]    Dg Chest 2 View  Result Date: 09/11/2018 CLINICAL DATA:  Chest pain. EXAM: CHEST - 2 VIEW COMPARISON:  CT scan and radiographs of April 18, 2018. FINDINGS: Stable cardiomegaly. No pneumothorax or pleural effusion is noted. Both lungs are clear. The visualized skeletal structures are unremarkable. IMPRESSION: No active cardiopulmonary disease. Electronically Signed   By: Lupita Raider M.D.   On: 09/11/2018 07:10   Ct Head Wo Contrast  Result Date: 09/11/2018 CLINICAL DATA:  Altered mental status. Schizophrenia. EXAM: CT HEAD WITHOUT  CONTRAST TECHNIQUE: Contiguous axial images were obtained from the base of the skull through the vertex without intravenous contrast. COMPARISON:  04/18/2018 FINDINGS: Brain: No evidence of acute infarction, hemorrhage, hydrocephalus, extra-axial collection or mass lesion/mass effect. Chronic prominent empty sella. Vascular: No hyperdense vessel or unexpected calcification. Skull: Normal. Negative for fracture or focal lesion. Sinuses/Orbits: Normal. Other: None IMPRESSION: 1. No acute intracranial abnormality. 2. Chronic prominent empty sella. Electronically Signed   By: Francene Boyers M.D.   On: 09/11/2018 12:00    Pending Labs Unresulted Labs (From admission, onward)    Start     Ordered   09/11/18 1700  Troponin I (High Sensitivity)  ONCE - STAT,   STAT    Question:  Indication  Answer:  Suspect ACS   09/11/18 1213   Signed and Held  HIV antibody (Routine Testing)  Once,   R     Signed and Held   Signed and Held  Basic metabolic panel  Tomorrow morning,   R     Signed and Held   Signed and Held  CBC  Tomorrow morning,   R     Signed and Held          Vitals/Pain Today's Vitals   09/11/18 1045 09/11/18 1100 09/11/18  1200 09/11/18 1230  BP:  (!) 159/108 (!) 163/89 (!) 167/98  Pulse: 71 71 76 77  Resp: 17 14 (!) 25 17  Temp:      TempSrc:      SpO2: 100% 99% 99% 99%  Weight:      Height:      PainSc:        Isolation Precautions No active isolations  Medications Medications  labetalol (NORMODYNE) injection 20 mg (20 mg Intravenous Given 09/11/18 0748)  cloNIDine (CATAPRES) tablet 0.1 mg (0.1 mg Oral Given 09/11/18 0902)  hydrALAZINE (APRESOLINE) tablet 100 mg (100 mg Oral Given 09/11/18 0903)    Mobility walks

## 2018-09-11 NOTE — Progress Notes (Signed)
CSW called non-emergency dispatch and was told that an officer was sent out but there are notes indicating the outcome.  Dispatch is re-sending [employer] to serve the pt on the medical floor now.  CSW will continue to follow for D/C needs.  Alphonse Guild. Quianna Avery, LCSW, LCAS, CSI Transitions of Care Clinical Social Worker Care Coordination Department Ph: [phone]

## 2018-09-11 NOTE — Progress Notes (Signed)
2nd shift ED CSW received a handoff from the 1st shift [facility] ED CSW.   CSW received IVC papers to be served by [facility] and will arrive to the ED shortly after calling [employer] so paperwork can be placed into the chart.  CSW will continue to follow for D/C needs.  Alphonse Guild. Dalila Arca, LCSW, LCAS, CSI Transitions of Care Clinical Social Worker Care Coordination Department Ph: [phone]

## 2018-09-11 NOTE — ED Notes (Signed)
Pt with tangenital speech.  Able to appropriately answer orientation question.  Will continue to monitor.

## 2018-09-11 NOTE — Progress Notes (Signed)
  Echocardiogram 2D Echocardiogram has been performed.  Erin Riley 09/11/2018, 3:10 PM

## 2018-09-12 DIAGNOSIS — F29 Unspecified psychosis not due to a substance or known physiological condition: Secondary | ICD-10-CM

## 2018-09-12 DIAGNOSIS — F200 Paranoid schizophrenia: Secondary | ICD-10-CM | POA: Diagnosis not present

## 2018-09-12 LAB — BASIC METABOLIC PANEL
Anion gap: 7 (ref 5–15)
BUN: 22 mg/dL (ref 8–23)
CO2: 25 mmol/L (ref 22–32)
Calcium: 9.3 mg/dL (ref 8.9–10.3)
Chloride: 105 mmol/L (ref 98–111)
Creatinine, Ser: 1.42 mg/dL — ABNORMAL HIGH (ref 0.44–1.00)
GFR calc Af Amer: 44 mL/min — ABNORMAL LOW (ref >60)
GFR calc non Af Amer: 38 mL/min — ABNORMAL LOW (ref >60)
Glucose, Bld: 115 mg/dL — ABNORMAL HIGH (ref 70–99)
Potassium: 3.6 mmol/L (ref 3.5–5.1)
Sodium: 137 mmol/L (ref 135–145)

## 2018-09-12 LAB — CBC
HCT: 38.5 % (ref 36.0–46.0)
Hemoglobin: 12.6 g/dL (ref 12.0–15.0)
MCH: 31.3 pg (ref 26.0–34.0)
MCHC: 32.7 g/dL (ref 30.0–36.0)
MCV: 95.8 fL (ref 80.0–100.0)
Platelets: 265 10*3/uL (ref 150–400)
RBC: 4.02 MIL/uL (ref 3.87–5.11)
RDW: 13.4 % (ref 11.5–15.5)
WBC: 7.2 10*3/uL (ref 4.0–10.5)
nRBC: 0 % (ref 0.0–0.2)

## 2018-09-12 LAB — HIV ANTIBODY (ROUTINE TESTING W REFLEX): HIV Screen 4th Generation wRfx: NONREACTIVE

## 2018-09-12 MED ORDER — HYDROXYZINE HCL 25 MG PO TABS: 25.0000 mg | ORAL_TABLET | Freq: Four times a day (QID) | ORAL | 0 refills | Status: DC

## 2018-09-12 MED ORDER — CLONIDINE HCL 0.1 MG PO TABS: 0.1000 mg | ORAL_TABLET | Freq: Two times a day (BID) | ORAL | 0 refills | Status: DC

## 2018-09-12 MED ORDER — HYDRALAZINE HCL 50 MG PO TABS: 100.0000 mg | ORAL_TABLET | Freq: Three times a day (TID) | ORAL | Status: DC

## 2018-09-12 MED ORDER — OLANZAPINE 5 MG PO TABS
5.0000 mg | ORAL_TABLET | Freq: Two times a day (BID) | ORAL | Status: DC
  Administered 2018-09-12 – 2018-09-13 (×2): 5 mg via ORAL
  Filled 2018-09-12 (×2): qty 1

## 2018-09-12 MED ORDER — CARVEDILOL 3.125 MG PO TABS
3.1250 mg | ORAL_TABLET | Freq: Two times a day (BID) | ORAL | Status: DC
  Administered 2018-09-13: 3.125 mg via ORAL
  Filled 2018-09-12: qty 1

## 2018-09-12 MED ORDER — CARVEDILOL 25 MG PO TABS: 25.0000 mg | ORAL_TABLET | Freq: Two times a day (BID) | ORAL | 0 refills | Status: DC

## 2018-09-12 MED ORDER — ISOSORBIDE MONONITRATE ER 30 MG PO TB24: 90.0000 mg | ORAL_TABLET | Freq: Every day | ORAL | 0 refills | Status: DC

## 2018-09-12 MED ORDER — SODIUM CHLORIDE 0.9 % IV SOLN
INTRAVENOUS | Status: DC
  Administered 2018-09-12: 15:00:00 via INTRAVENOUS

## 2018-09-12 MED ORDER — HYDRALAZINE HCL 100 MG PO TABS: 100.0000 mg | ORAL_TABLET | Freq: Three times a day (TID) | ORAL | 0 refills | Status: DC

## 2018-09-12 MED ORDER — CLONIDINE HCL 0.1 MG PO TABS
0.1000 mg | ORAL_TABLET | Freq: Two times a day (BID) | ORAL | Status: DC
  Administered 2018-09-13: 0.1 mg via ORAL
  Filled 2018-09-12: qty 1

## 2018-09-12 NOTE — Progress Notes (Signed)
Secured iPad for Dr. Mariea Clonts to talk with patient.  MD and patient had virtual appointment while RN and NT were present in patient room.

## 2018-09-12 NOTE — BH Assessment (Signed)
Patient is to be admitted to [facility] by[facility] Nurse Practitioner, Marvia Pickles. Attending Physician will be Dr. Weber Cooks.  Patient has been assigned to room 309, by [place] F.  Call report to [phone].  Representative/Transfer Coordinator is Dispensing optician Patient pre-admitted by [facility] Patient Access Meredith Mody S.) [address] [place] Staff Alinda Sierras C.,Social Work) made aware of acceptance.  Patient bed is available anytime after 8pm.

## 2018-09-12 NOTE — Consult Note (Signed)
Telepsych Consultation   Reason for Consult:  "Acute psychosis. History of paranoid schizophrenia" Referring Physician:  Dr. Dow Adolph Location of Patient: [facility] Location of Provider:  [facility]  Patient Identification: Erin Riley MRN:  [id number] Principal Diagnosis: Paranoid schizophrenia [facility]) Diagnosis:  Principal Problem:   Hypertensive crisis Active Problems:   Essential hypertension   Paranoid schizophrenia (HCC)   CKD (chronic kidney disease) stage 3, GFR 30-59 ml/min (HCC)   Obesity (BMI 30-39.9)   PAD (peripheral artery disease) (HCC)   Left ventricular diastolic dysfunction, NYHA class 1   Hyperlipidemia with target LDL less than 100   Chest pain   Total Time spent with patient: 1 hour  Subjective:   Erin Riley is a 68 y.o. female patient admitted with hypertensive crisis.  HPI:   Per chart review, patient was admitted with hypertensive crisis likely secondary to medication noncompliance. She is medically cleared. Psychiatry is consulted for psychosis. She has a history of paranoid schizophrenia. She has not been taking her medications for a couple of weeks. She believes that someone is harassing her. She also reports that she is pregnant and is in labor. She has been responding to internal stimuli. She has bene aggressive and yelling at staff. She was placed under IVC since she was refusing to be admitted to the hospital.   Of note, she was last seen at [facility] by the psychiatry service for psychosis. She was started on Zyprexa 5 mg BID and Trazodone 50 mg qhs. She was transferred to [facility] for further psychiatric care.   On interview, Erin Riley reports reports that she is doing fine. She reports that her legs and arms were bothering her so she came to the hospital. She reports taking her medications for hypertension. She denies a history of mental health problems. She reports that a woman named Elnita Maxwell has been harassing her and "putting  stuff inside of me." She was asked to elaborate and reported, "You ask to ask the doctor. You ask the people downstairs." She reports thoughts to hurt her but denies any intention. She reports, "I would like the police to do something about it." She denies SI or AVH. She becomes more irritable and disorganized in thought process throughout the interview.    Past Psychiatric History: Schizophrenia   Risk to Self:  None. Denies SI.  Risk to Others:  She endorses thoughts to harm a woman named Elnita Maxwell likely secondary to delusions.  Prior Inpatient Therapy:  She was last admitted to [facility] in 04/2018 for psychosis.  Prior Outpatient Therapy:  She is followed at [facility].   Past Medical History:  Past Medical History:  Diagnosis Date  . Chronic back pain   . Chronic kidney disease (CKD), stage II (mild)    Class I-II  . Coronary artery disease 04/2009   50% stenosis in the perforator of LAD; catheterization was for an abnormal Myoview in January 2000 showing anterior and inferolateral ischemia.  . Diverticulitis   . 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 > 3 subsequent Echos show improved/normal EF with moderate to severe concentric LVH and diastolic dysfunction with LVOT/intracavitary gradient --> 06/2016: Severe LVH.  Vigorous EF, 65-70%.?? Gr 1 DD. Mild AS.  [name] Hyperlipidemia   . Hypertension   . Hypertensive hypertrophic cardiomyopathy: NYHA class II:  Echo: Severe concentric LVH with LV OT gradient; essentially preserved EF with  diastolic dysfunction 02/15/2013   Echo 06/2016: Severe Concentric LVH. Vigorous EF 65-70%. ~ Gr I DD.   . Mild aortic stenosis by prior echocardiography    Echo 06/2016: Mild AS (Mean Gradient 15 mmHg); has had prior Mod-Severe MR (not seen on current echo)  . PAD (peripheral artery disease) [facility]) March 2013   Lower extremity Dopplers: R. SFA 50-60%, R.  PTA proximally occluded with distal reconstitution;; L. common iliac ~50%, L. SFA 50-70% stenosis, L. PTA < 50%  . Schizophrenia [facility])     Past Surgical History:  Procedure Laterality Date  . BUNIONECTOMY    . carotid doppler  05/29/2011   left bulb/prox ICA moderate amtfibrous plaque with no evidence significant reduction.,right bulb /proximal ICA normal patency  . lower extremity doppler  05/29/2011   right SFA 50% to 59% diameter reduction,right posterior tibal atreery occlusive disease,reconstituting distally, left common illiac<50%,left SFA 50 to70%,left post. tibial <50%  . NM MYOCAR PERF WALL MOTION  03/2009   Persantine; EF 51%-both anterior and inferolateral ischemia  . TRANSTHORACIC ECHOCARDIOGRAM  06/2016   Severe LVH.  Vigorous EF of 65-70%.  No RWMA. ~Only grade 1 diastolic dysfunction.  Mild aortic stenosis (mean gradient 15 mmHg)  . TRANSTHORACIC ECHOCARDIOGRAM  07/2012   EF 50-55%; severe concentric LVH; only grade 1 diastolic dysfunction. Mild aortic sclerosis - with LVOT /intracavitary gradient of roughly 20 mmHg mean. Mild to moderately dilated LA;; previously reported MR not seen   Family History:  Family History  Problem Relation Age of Onset  . Hypertension Mother   . Breast cancer Neg Hx    Family Psychiatric  History: Sister-marijuana  Social History:  Social History   Substance and Sexual Activity  Alcohol Use No     Social History   Substance and Sexual Activity  Drug Use No    Social History   Socioeconomic History  . Marital status: Single    Spouse name: Not on file  . Number of children: 7  . Years of education: Not on file  . Highest education level: Not on file  Occupational History  . Not on file  Social Needs  . Financial resource strain: Not on file  . Food insecurity    Worry: Not on file    Inability: Not on file  . Transportation needs    Medical: Not on file    Non-medical: Not on file  Tobacco Use  . Smoking status: Former  Smoker    Types: Cigarettes    Quit date: 05/14/2002    Years since quitting: 16.3  . Smokeless tobacco: Never Used  Substance and Sexual Activity  . Alcohol use: No  . Drug use: No  . Sexual activity: Yes    Birth control/protection: Post-menopausal  Lifestyle  . Physical activity    Days per week: Not on file    Minutes per session: Not on file  . Stress: Not on file  Relationships  . Social Musician on phone: Not on file    Gets together: Not on file    Attends religious service: Not on file    Active member of club or organization: Not on file    Attends meetings of clubs or organizations: Not on file    Relationship status: Not on file  Other Topics Concern  . Not on file  Social History Narrative   Now single mother of 2 with one grandchild. She quit smoking roughly 5 years ago and is not so  since. She has also stopped drinking alcohol. She does try get routine exercise walking at least a mile 3-4 days a week.    She lives with her 67 year old mother. 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   Additional Social History: She lives with her son, Denzil. She denies alcohol or illicit substance use.     Allergies:  No Known Allergies  Labs:  Results for orders placed or performed during the hospital encounter of 09/11/18 (from the past 48 hour(s))  Wet prep, genital     Status: Abnormal   Collection Time: 09/11/18  6:06 AM  Result Value Ref Range   Yeast Wet Prep HPF POC NONE SEEN NONE SEEN   Trich, Wet Prep NONE SEEN NONE SEEN   Clue Cells Wet Prep HPF POC NONE SEEN NONE SEEN   WBC, Wet Prep HPF POC MANY (A) NONE SEEN   Sperm NONE SEEN     Comment: Performed at [facility], [address] [place], [place] [address]  CBC     Status: None   Collection Time: 09/11/18  6:27 AM  Result Value Ref Range   WBC 7.1 4.0 - 10.5 K/uL   RBC 4.47 3.87 - 5.11 MIL/uL   Hemoglobin 13.9 12.0 - 15.0 g/dL   HCT 21.342.9 08.636.0 - 57.846.0 %   MCV 96.0 80.0 - 100.0 fL    MCH 31.1 26.0 - 34.0 pg   MCHC 32.4 30.0 - 36.0 g/dL   RDW 46.913.3 62.911.5 - 52.815.5 %   Platelets 284 150 - 400 K/uL   nRBC 0.0 0.0 - 0.2 %    Comment: Performed at [facility], [address] [place], [place] [address]  Comprehensive metabolic panel     Status: Abnormal   Collection Time: 09/11/18  6:27 AM  Result Value Ref Range   Sodium 139 135 - 145 mmol/L   Potassium 3.6 3.5 - 5.1 mmol/L   Chloride 105 98 - 111 mmol/L   CO2 24 22 - 32 mmol/L   Glucose, Bld 127 (H) 70 - 99 mg/dL   BUN 17 8 - 23 mg/dL   Creatinine, Ser 4.011.23 (H) 0.44 - 1.00 mg/dL   Calcium 9.4 8.9 - 02.710.3 mg/dL   Total Protein 7.6 6.5 - 8.1 g/dL   Albumin 3.9 3.5 - 5.0 g/dL   AST 15 15 - 41 U/L   ALT 14 0 - 44 U/L   Alkaline Phosphatase 72 38 - 126 U/L   Total Bilirubin 0.7 0.3 - 1.2 mg/dL   GFR calc non Af Amer 45 (L) >60 mL/min   GFR calc Af Amer 53 (L) >60 mL/min   Anion gap 10 5 - 15    Comment: Performed at [facility], [address] [place], [place] [address]  Troponin I (High Sensitivity)     Status: Abnormal   Collection Time: 09/11/18  6:27 AM  Result Value Ref Range   Troponin I (High Sensitivity) 22.0 (H) <18 ng/L    Comment: (NOTE) Elevated high sensitivity troponin I (hsTnI) values and significant  changes across serial measurements may suggest ACS but many other  chronic and acute conditions are known to elevate hsTnI results.  Refer to the "Links" section for chest pain algorithms and additional  guidance. Performed at [facility], [address] [place], [place] [address]   Urinalysis, Routine w reflex microscopic- may I&O cath if menses     Status: Abnormal   Collection Time: 09/11/18  7:21 AM  Result Value Ref Range   Color, Urine YELLOW YELLOW   APPearance CLEAR CLEAR   Specific Gravity, Urine 1.021 1.005 - 1.030   pH 5.0 5.0 - 8.0   Glucose, UA NEGATIVE NEGATIVE mg/dL   Hgb urine dipstick NEGATIVE NEGATIVE   Bilirubin  Urine NEGATIVE NEGATIVE   Ketones, ur NEGATIVE NEGATIVE mg/dL   Protein, ur 409100 (A) NEGATIVE mg/dL   Nitrite NEGATIVE NEGATIVE   Leukocytes,Ua NEGATIVE NEGATIVE   RBC / HPF 0-5 0 - 5 RBC/hpf   WBC, UA 0-5 0 - 5 WBC/hpf   Bacteria, UA RARE (A) NONE SEEN   Squamous Epithelial / LPF 0-5 0 - 5   Mucus PRESENT     Comment: Performed at [facility], [address] [place], [place] [address]  Urine rapid drug screen (hosp performed)     Status: None   Collection Time: 09/11/18  7:31 AM  Result Value Ref Range   Opiates NONE DETECTED NONE DETECTED   Cocaine NONE DETECTED NONE DETECTED   Benzodiazepines NONE DETECTED NONE DETECTED   Amphetamines NONE DETECTED NONE DETECTED   Tetrahydrocannabinol NONE DETECTED NONE DETECTED   Barbiturates NONE DETECTED NONE DETECTED    Comment: (NOTE) DRUG SCREEN FOR MEDICAL PURPOSES ONLY.  IF CONFIRMATION IS NEEDED FOR ANY PURPOSE, NOTIFY LAB WITHIN 5 DAYS. LOWEST DETECTABLE LIMITS FOR URINE DRUG SCREEN Drug Class                     Cutoff (ng/mL) Amphetamine and metabolites    1000 Barbiturate and metabolites    200 Benzodiazepine                 200 Tricyclics and metabolites     300 Opiates and metabolites        300 Cocaine and metabolites        300 THC                            50 Performed at [facility], [address] [place], [place] [address]   SARS Coronavirus 2 (CEPHEID - Performed in [facility] hospital lab), Hosp Order     Status: None   Collection Time: 09/11/18  8:30 AM   Specimen: Nasopharyngeal Swab  Result Value Ref Range   SARS Coronavirus 2 NEGATIVE NEGATIVE    Comment: (NOTE) If result is NEGATIVE SARS-CoV-2 target nucleic acids are NOT DETECTED. The SARS-CoV-2 RNA is generally detectable in upper and lower  respiratory specimens during the acute phase of infection. The lowest  concentration of SARS-CoV-2 viral copies this assay can detect is 250  copies / mL. A negative  result does not preclude SARS-CoV-2 infection  and should not be used as the sole basis for treatment or other  patient management decisions.  A negative result may occur with  improper specimen collection / handling, submission of specimen other  than nasopharyngeal swab, presence of viral mutation(s) within the  areas targeted by this assay, and inadequate number of viral copies  (<250 copies / mL). A negative result must be combined with clinical  observations, patient history, and epidemiological information. If result is POSITIVE SARS-CoV-2 target nucleic acids are DETECTED. The SARS-CoV-2 RNA is generally detectable in upper and lower  respiratory specimens dur ing the acute phase of infection.  Positive  results are indicative of active infection with SARS-CoV-2.  Clinical  correlation with patient history and  other diagnostic information is  necessary to determine patient infection status.  Positive results do  not rule out bacterial infection or co-infection with other viruses. If result is PRESUMPTIVE POSTIVE SARS-CoV-2 nucleic acids MAY BE PRESENT.   A presumptive positive result was obtained on the submitted specimen  and confirmed on repeat testing.  While 2019 novel coronavirus  (SARS-CoV-2) nucleic acids may be present in the submitted sample  additional confirmatory testing may be necessary for epidemiological  and / or clinical management purposes  to differentiate between  SARS-CoV-2 and other Sarbecovirus currently known to infect humans.  If clinically indicated additional testing with an alternate test  methodology [phone]) is advised. The SARS-CoV-2 RNA is generally  detectable in upper and lower respiratory sp ecimens during the acute  phase of infection. The expected result is Negative. Fact Sheet for Patients:  BoilerBrush.com.cy Fact Sheet for Healthcare Providers: https://pope.com/ This test is not yet  approved or cleared by the [place] FDA and has been authorized for detection and/or diagnosis of SARS-CoV-2 by FDA under an Emergency Use Authorization (EUA).  This EUA will remain in effect (meaning this test can be used) for the duration of the COVID-19 declaration under Section 564(b)(1) of the Act, 21 U.S.C. section 360bbb-3(b)(1), unless the authorization is terminated or revoked sooner. Performed at [facility], [address], [place], [place] [address]   Troponin I (High Sensitivity)     Status: Abnormal   Collection Time: 09/11/18  9:06 AM  Result Value Ref Range   Troponin I (High Sensitivity) 24.0 (H) <18 ng/L    Comment: (NOTE) Elevated high sensitivity troponin I (hsTnI) values and significant  changes across serial measurements may suggest ACS but many other  chronic and acute conditions are known to elevate hsTnI results.  Refer to the "Links" section for chest pain algorithms and additional  guidance. Performed at [facility], [address], [place], [place] [address]   Troponin I (High Sensitivity)     Status: Abnormal   Collection Time: 09/11/18  5:29 PM  Result Value Ref Range   Troponin I (High Sensitivity) 24.0 (H) <18 ng/L    Comment: (NOTE) Elevated high sensitivity troponin I (hsTnI) values and significant  changes across serial measurements may suggest ACS but many other  chronic and acute conditions are known to elevate hsTnI results.  Refer to the "Links" section for chest pain algorithms and additional  guidance. Performed at [facility], [address] [place], [place] [address]   HIV antibody (Routine Testing)     Status: None   Collection Time: 09/11/18  5:29 PM  Result Value Ref Range   HIV Screen 4th Generation wRfx Non Reactive Non Reactive    Comment: (NOTE) Performed At: [facility] [address] [place], [place] [id number] Jolene Schimke MD HQ:[id number]    Basic metabolic panel     Status: Abnormal   Collection Time: 09/12/18  4:56 AM  Result Value Ref Range   Sodium 137 135 - 145 mmol/L   Potassium 3.6 3.5 - 5.1 mmol/L   Chloride 105 98 - 111 mmol/L   CO2 25 22 - 32 mmol/L   Glucose, Bld 115 (H) 70 - 99 mg/dL   BUN 22 8 - 23 mg/dL   Creatinine, Ser 1.32 (H) 0.44 - 1.00 mg/dL   Calcium 9.3 8.9 - 44.0 mg/dL   GFR calc non Af Amer 38 (L) >60 mL/min   GFR calc Af Amer 44 (  L) >60 mL/min   Anion gap 7 5 - 15    Comment: Performed at [facility], [place] [address], [place], [place] [address]  CBC     Status: None   Collection Time: 09/12/18  4:56 AM  Result Value Ref Range   WBC 7.2 4.0 - 10.5 K/uL   RBC 4.02 3.87 - 5.11 MIL/uL   Hemoglobin 12.6 12.0 - 15.0 g/dL   HCT 38.5 36.0 - 46.0 %   MCV 95.8 80.0 - 100.0 fL   MCH 31.3 26.0 - 34.0 pg   MCHC 32.7 30.0 - 36.0 g/dL   RDW 13.4 11.5 - 15.5 %   Platelets 265 150 - 400 K/uL   nRBC 0.0 0.0 - 0.2 %    Comment: Performed at [facility], [place] [address], [place], [place] [address]    Medications:  Current Facility-Administered Medications  Medication Dose Route Frequency Provider Last Rate Last Dose  . acetaminophen (TYLENOL) tablet 650 mg  650 mg Oral Q6H PRN Alma Friendly, MD       Or  . acetaminophen (TYLENOL) suppository 650 mg  650 mg Rectal Q6H PRN Alma Friendly, MD      . carvedilol (COREG) tablet 25 mg  25 mg Oral BID WC Alma Friendly, MD   25 mg at 09/12/18 1109  . cloNIDine (CATAPRES) tablet 0.1 mg  0.1 mg Oral BID Alma Friendly, MD   0.1 mg at 09/12/18 1110  . famotidine (PEPCID) tablet 20 mg  20 mg Oral QHS Alma Friendly, MD   20 mg at 09/11/18 2354  . heparin injection 5,000 Units  5,000 Units Subcutaneous Q8H Alma Friendly, MD   5,000 Units at 09/12/18 [phone]  . hydrALAZINE (APRESOLINE) tablet 100 mg  100 mg Oral Q8H Alma Friendly, MD   100 mg at 09/12/18 0650  . hydrOXYzine  (ATARAX/VISTARIL) tablet 25 mg  25 mg Oral QID Alma Friendly, MD   25 mg at 09/12/18 1109  . isosorbide mononitrate (IMDUR) 24 hr tablet 90 mg  90 mg Oral Daily Alma Friendly, MD   90 mg at 09/12/18 1109  . ondansetron (ZOFRAN) tablet 4 mg  4 mg Oral Q6H PRN Alma Friendly, MD       Or  . ondansetron [facility]) injection 4 mg  4 mg Intravenous Q6H PRN Alma Friendly, MD      . senna-docusate (Senokot-S) tablet 1 tablet  1 tablet Oral QHS PRN Alma Friendly, MD        Musculoskeletal: Strength & Muscle Tone: No atrophy noted. Gait & Station: UTA since patient is sitting in a chair. Patient leans: N/A  Psychiatric Specialty Exam: Physical Exam  Nursing note and vitals reviewed. Constitutional: She is oriented to person, place, and time. She appears well-developed and well-nourished.  HENT:  Head: Normocephalic and atraumatic.  Neck: Normal range of motion.  Respiratory: Effort normal.  Musculoskeletal: Normal range of motion.  Neurological: She is alert and oriented to person, place, and time.  Psychiatric: Her behavior is normal. Judgment normal. Her affect is labile. Her speech is tangential. Thought content is delusional. Cognition and memory are impaired.    Review of Systems  Gastrointestinal: Negative for constipation, diarrhea, nausea and vomiting.  Musculoskeletal: Negative.   Psychiatric/Behavioral: Negative for hallucinations, substance abuse and suicidal ideas.  All other systems reviewed and are negative.   Blood pressure 114/60, pulse 74, temperature 98.6 F (37 C),  resp. rate 18, height 5\' 8"  (1.727 m), weight 101.9 kg, last menstrual period 05/10/2013, SpO2 97 %.Body mass index is 34.17 kg/m.  General Appearance: Fairly Groomed  Eye Contact:  Good  Speech:  Clear and Coherent and Normal Rate  Volume:  Normal  Mood:  Irritable  Affect:  Congruent  Thought Process:  Disorganized and Descriptions of Associations: Tangential   Orientation:  Full (Time, Place, and Person)  Thought Content:  Delusions and Paranoid Ideation  Suicidal Thoughts:  No  Homicidal Thoughts:  Yes.  without intent/plan  Memory:  Immediate;   Good Recent;   Good Remote;   Good  Judgement:  Impaired  Insight:  Shallow  Psychomotor Activity:  Normal  Concentration:  Concentration: Good and Attention Span: Good  Recall:  Good  Fund of Knowledge:  Good  Language:  Good  Akathisia:  No  Handed:  Right  AIMS (if indicated):   N/A  Assets:  Housing Resilience Social Support  ADL's:  Intact  Cognition:  Impaired due to psychiatric condition.  Sleep:   N/A   Assessment: Erin Riley is a 68 y.o. female with hypertensive crisis likely secondary to medication noncompliance. Patient endorses delusional thoughts and paranoia. She is irritable and labile in affect. She is tangential in thought process. She denies a psychiatric history and is not currently taking psychotropic medication which likely negatively affects her ability to care for herself. She presented with hypertensive crisis and therefore likely has not been taking her antihypertensive medication either. She warrants inpatient psychiatric hospitalization for stabilization and treatment.   Treatment Plan Summary: -Patient warrants inpatient psychiatric hospitalization due to active psychosis.  -Continue bedside sitter.  -Start Zyprexa 5 mg BID for psychosis.  -Patient is under IVC so she cannot leave the hospital.  -Will sign off on patient at this time. Please consult psychiatry again as needed.     Disposition: Recommend psychiatric Inpatient admission when medically cleared.  This service was provided via telemedicine using a 2-way, interactive audio and video technology.  Names of all persons participating in this telemedicine service and their role in this encounter. Name: Juanetta BeetsJacqueline Norman, DO Role: Psychiatrist  Name: Erin Riley  Role: Patient    Cherly BeachJacqueline J Norman,  DO 09/12/2018 12:48 PM

## 2018-09-12 NOTE — TOC Transition Note (Addendum)
Transition of Care [facility]) - CM/SW Discharge Note   Patient Details  Name: Erin Riley MRN: [id number] Date of Birth: [birth year]  Transition of Care [facility]) CM/SW Contact:  Lynnell Catalan, RN Phone Number: 09/12/2018, 4:13 PM   Clinical Narrative:     Can transport to [facility] 09/13/2018 in am to room 309. Transport not available this evening. Number for RN to call report [phone].   Marney Doctor RN,BSN [phone]

## 2018-09-12 NOTE — TOC Transition Note (Signed)
Transition of Care [facility]) - CM/SW Discharge Note   Patient Details  Name: Erin Riley MRN: [id number] Date of Birth: 1950-10-22  Transition of [facility]) CM/SW Contact:  Lynnell Catalan, RN Phone Number: 09/12/2018, 2:45 PM   Clinical Narrative:    [facility] called for inpt psych bed. Awaiting return call.  Marney Doctor RN,BSN [phone]

## 2018-09-12 NOTE — Care Management Obs Status (Signed)
[place] NOTIFICATION   Patient Details  Name: Erin Riley MRN: [id number] Date of Birth: 05/03/1950   Medicare Observation Status Notification Given:  No(Pt aggressive. Unable to reach son)    Lynnell Catalan, RN 09/12/2018, 1:40 PM

## 2018-09-12 NOTE — Discharge Summary (Signed)
Discharge Summary  Erin Riley:[id number] DOB: Mar 09, 1950  PCP: Patient, No Pcp Per  Admit date: 09/11/2018 Discharge date: 09/12/2018  Time spent: 35 minutes  Recommendations for Outpatient Follow-up:  1. Transfer to [facility] when bed is available. 2. The patient is medically stable for discharge to [facility].  Discharge Diagnoses:  Active Hospital Problems   Diagnosis Date Noted  . Hypertensive crisis 09/11/2018  . Chest pain 09/11/2018  . Obesity (BMI 30-39.9) 02/15/2013  . Left ventricular diastolic dysfunction, NYHA class 1   . Hyperlipidemia with target LDL less than 100   . Paranoid schizophrenia ([place]) 08/01/2012  . CKD (chronic kidney disease) stage 3, GFR 30-59 ml/min (HCC) 08/01/2012  . Essential hypertension 05/15/2012  . PAD (peripheral artery disease) ([place]) 05/05/2011    Resolved Hospital Problems  No resolved problems to display.    Discharge Condition: Stable  Diet recommendation: Resume previous diet  Vitals:   09/12/18 0008 09/12/18 0625  BP: 127/72 114/60  Pulse: 78 74  Resp: 18 18  Temp: 97.6 F (36.4 C) 98.6 F (37 C)  SpO2: 99% 97%    History of present illness:  Erin Riley is a 68 y.o. female with medical history significant for paranoid schizophrenia, CAD, CKD, Hypertension, presents to the ED complaining that she is here in the ED because her water broke and she is having contractions, about to have a baby.  Patient has been psychotic, paranoid and mentally unstable.  Patient was found talking to herself, being somewhat aggressive and yelling at staff members.  Patient reports some left-sided breast pain, unable to characterize the pain further.  Patient stated she has not been taking her medication for the last couple of weeks because someone has been harassing her.  Patient is a very poor historian likely due to her psychosis.  ED Course: Patient noted to be in hypertensive crisis, troponins noted to be mildly elevated with some new T wave changes on  her EKG. cardiology was consulted.  Patient initially refusing to be admitted, had to be IVC'ed.  UDS negative, CT head no acute intracranial abnormalities.  Patient admitted for further management.  09/12/18: Patient seen and examined at her bedside.  She denies any headache or change in vision.  She denies chest pain, dyspnea or palpitations.  No GI symptoms.  Blood pressure is normotensive.  Lab studies reviewed and are stable.  Per cardiology no plan for further cardiac work up.  Patient is medically stable for transfer to [facility].    Hospital Course:  Principal Problem:   Hypertensive crisis Active Problems:   Essential hypertension   Paranoid schizophrenia (HCC)   CKD (chronic kidney disease) stage 3, GFR 30-59 ml/min (HCC)   Obesity (BMI 30-39.9)   PAD (peripheral artery disease) (HCC)   Left ventricular diastolic dysfunction, NYHA class 1   Hyperlipidemia with target LDL less than 100   Chest pain  Hypertensive crisis likely secondary to medication noncompliance On presentation BP noted to be elevated SBP 200s, DBP 100s Currently blood pressure is normotensive and has been stable since restarting on her blood pressure medications UDS negative CT head negative for any acute intracranial changes Continue home meds Coreg, clonidine, hydralazine, Imdur  Elevated troponin/EKG changes/history of CAD Resolved left-sided breast/chest pain Troponin mildly elevated, peaked at 24 and flat Denies chest pain during visit on 09/12/2018 EKG with T wave changes, present previously Echo with normal EF Cardiology consulted no plan for further cardiac work-up.  CKD stage III Cr at baseline  Continue to avoid nephrotoxins  Paranoid schizophrenia Not taking any meds Psych consulted appreciate recs  Obesity Lifestyle modification advised     Code Status: Full    Consults called: Cardiology, psychiatry    Discharge Exam: BP 114/60 (BP Location: Left Arm)   Pulse 74    Temp 98.6 F (37 C)   Resp 18   Ht 5\' 8"  (1.727 m)   Wt 101.9 kg   LMP 05/10/2013   SpO2 97%   BMI 34.17 kg/m  . General: 68 y.o. year-old female well developed well nourished in no acute distress.  Alert and oriented x3. . Cardiovascular: Regular rate and rhythm with no rubs or gallops.  No thyromegaly or JVD noted.   [name]. Respiratory: Clear to auscultation with no wheezes or rales. Good inspiratory effort. . Abdomen: Soft nontender nondistended with normal bowel sounds x4 quadrants. . Musculoskeletal: No lower extremity edema. 2/4 pulses in all 4 extremities. . Skin: No ulcerative lesions noted or rashes, . Psychiatry: Mood is appropriate for condition and setting  Discharge Instructions You were cared for by a hospitalist during your hospital stay. If you have any questions about your discharge medications or the care you received while you were in the hospital after you are discharged, you can call the unit and asked to speak with the hospitalist on call if the hospitalist that took care of you is not available. Once you are discharged, your primary care physician will handle any further medical issues. Please note that NO REFILLS for any discharge medications will be authorized once you are discharged, as it is imperative that you return to your primary care physician (or establish a relationship with a primary care physician if you do not have one) for your aftercare needs so that they can reassess your need for medications and monitor your lab values.   Allergies as of 09/12/2018   No Known Allergies     Medication List    STOP taking these medications   alum & mag hydroxide-simeth 200-200-20 MG/5ML suspension Commonly known as: MAALOX/MYLANTA   famotidine 20 MG tablet Commonly known as: PEPCID   metroNIDAZOLE 500 MG tablet Commonly known as: FLAGYL   pantoprazole 40 MG tablet Commonly known as: PROTONIX   predniSONE 10 MG tablet Commonly known as: DELTASONE    triamterene-hydrochlorothiazide 37.5-25 MG tablet Commonly known as: MAXZIDE-25     TAKE these medications   carvedilol 25 MG tablet Commonly known as: COREG Take 1 tablet (25 mg total) by mouth 2 (two) times daily with a meal.   cloNIDine 0.1 MG tablet Commonly known as: CATAPRES Take 1 tablet (0.1 mg total) by mouth 2 (two) times daily.   hydrALAZINE 100 MG tablet Commonly known as: APRESOLINE Take 1 tablet (100 mg total) by mouth 3 (three) times daily. For high blood pressure   hydrOXYzine 25 MG tablet Commonly known as: ATARAX/VISTARIL Take 1 tablet (25 mg total) by mouth 4 (four) times daily. What changed: when to take this   isosorbide mononitrate 30 MG 24 hr tablet Commonly known as: IMDUR Take 3 tablets (90 mg total) by mouth daily. What changed: medication strength      No Known Allergies Follow-up Information    Marykay LexHarding, David W, MD. Call in 1 day(s).   Specialty: Cardiology Why: Please call for a post hospital follow-up appointment. Contact information: [address] [place] [address] [phone]            The results of significant diagnostics from  this hospitalization (including imaging, microbiology, ancillary and laboratory) are listed below for reference.    Significant Diagnostic Studies: Dg Chest 2 View  Result Date: 09/11/2018 CLINICAL DATA:  Chest pain. EXAM: CHEST - 2 VIEW COMPARISON:  CT scan and radiographs of April 18, 2018. FINDINGS: Stable cardiomegaly. No pneumothorax or pleural effusion is noted. Both lungs are clear. The visualized skeletal structures are unremarkable. IMPRESSION: No active cardiopulmonary disease. Electronically Signed   By: Lupita RaiderJames  Green Jr M.D.   On: 09/11/2018 07:10   Ct Head Wo Contrast  Result Date: 09/11/2018 CLINICAL DATA:  Altered mental status. Schizophrenia. EXAM: CT HEAD WITHOUT CONTRAST TECHNIQUE: Contiguous axial images were obtained from the base of the skull through the vertex  without intravenous contrast. COMPARISON:  04/18/2018 FINDINGS: Brain: No evidence of acute infarction, hemorrhage, hydrocephalus, extra-axial collection or mass lesion/mass effect. Chronic prominent empty sella. Vascular: No hyperdense vessel or unexpected calcification. Skull: Normal. Negative for fracture or focal lesion. Sinuses/Orbits: Normal. Other: None IMPRESSION: 1. No acute intracranial abnormality. 2. Chronic prominent empty sella. Electronically Signed   By: Francene BoyersJames  Maxwell M.D.   On: 09/11/2018 12:00    Microbiology: Recent Results (from the past 240 hour(s))  Wet prep, genital     Status: Abnormal   Collection Time: 09/11/18  6:06 AM  Result Value Ref Range Status   Yeast Wet Prep HPF POC NONE SEEN NONE SEEN Final   Trich, Wet Prep NONE SEEN NONE SEEN Final   Clue Cells Wet Prep HPF POC NONE SEEN NONE SEEN Final   WBC, Wet Prep HPF POC MANY (A) NONE SEEN Final   Sperm NONE SEEN  Final    Comment: Performed at [facility], [address] [place], [place] [address]  SARS Coronavirus 2 (CEPHEID - Performed in [facility] lab), Hosp Order     Status: None   Collection Time: 09/11/18  8:30 AM   Specimen: Nasopharyngeal Swab  Result Value Ref Range Status   SARS Coronavirus 2 NEGATIVE NEGATIVE Final    Comment: (NOTE) If result is NEGATIVE SARS-CoV-2 target nucleic acids are NOT DETECTED. The SARS-CoV-2 RNA is generally detectable in upper and lower  respiratory specimens during the acute phase of infection. The lowest  concentration of SARS-CoV-2 viral copies this assay can detect is 250  copies / mL. A negative result does not preclude SARS-CoV-2 infection  and should not be used as the sole basis for treatment or other  patient management decisions.  A negative result may occur with  improper specimen collection / handling, submission of specimen other  than nasopharyngeal swab, presence of viral mutation(s) within the  areas targeted by this  assay, and inadequate number of viral copies  (<250 copies / mL). A negative result must be combined with clinical  observations, patient history, and epidemiological information. If result is POSITIVE SARS-CoV-2 target nucleic acids are DETECTED. The SARS-CoV-2 RNA is generally detectable in upper and lower  respiratory specimens dur ing the acute phase of infection.  Positive  results are indicative of active infection with SARS-CoV-2.  Clinical  correlation with patient history and other diagnostic information is  necessary to determine patient infection status.  Positive results do  not rule out bacterial infection or co-infection with other viruses. If result is PRESUMPTIVE POSTIVE SARS-CoV-2 nucleic acids MAY BE PRESENT.   A presumptive positive result was obtained on the submitted specimen  and confirmed on repeat testing.  While 2019 novel coronavirus  (SARS-CoV-2) nucleic acids may  be present in the submitted sample  additional confirmatory testing may be necessary for epidemiological  and / or clinical management purposes  to differentiate between  SARS-CoV-2 and other Sarbecovirus currently known to infect humans.  If clinically indicated additional testing with an alternate test  methodology [phone] is advised. The SARS-CoV-2 RNA is generally  detectable in upper and lower respiratory sp ecimens during the acute  phase of infection. The expected result is Negative. Fact Sheet for Patients:  BoilerBrush.com.cyhttps://www.fda.gov/media/136312/download Fact Sheet for Healthcare Providers: https://pope.com/https://www.fda.gov/media/136313/download This test is not yet approved or cleared by the [place] FDA and has been authorized for detection and/or diagnosis of SARS-CoV-2 by FDA under an Emergency Use Authorization (EUA).  This EUA will remain in effect (meaning this test can be used) for the duration of the COVID-19 declaration under Section 564(b)(1) of the Act, 21 U.S.C. section 360bbb-3(b)(1),  unless the authorization is terminated or revoked sooner. Performed at [facility], [address] [place], [place] [address]      Labs: Basic Metabolic Panel: Recent Labs  Lab 09/11/18 0627 09/12/18 0456  NA 139 137  K 3.6 3.6  CL 105 105  CO2 24 25  GLUCOSE 127* 115*  BUN 17 22  CREATININE 1.23* 1.42*  CALCIUM 9.4 9.3   Liver Function Tests: Recent Labs  Lab 09/11/18 0627  AST 15  ALT 14  ALKPHOS 72  BILITOT 0.7  PROT 7.6  ALBUMIN 3.9   No results for input(s): LIPASE, AMYLASE in the last 168 hours. No results for input(s): AMMONIA in the last 168 hours. CBC: Recent Labs  Lab 09/11/18 0627 09/12/18 0456  WBC 7.1 7.2  HGB 13.9 12.6  HCT 42.9 38.5  MCV 96.0 95.8  PLT 284 265   Cardiac Enzymes: No results for input(s): CKTOTAL, CKMB, CKMBINDEX, TROPONINI in the last 168 hours. BNP: BNP (last 3 results) Recent Labs    01/09/18 1707  BNP 30.4    ProBNP (last 3 results) No results for input(s): PROBNP in the last 8760 hours.  CBG: No results for input(s): GLUCAP in the last 168 hours.     Signed:  Darlin Droparole N Emmajane Altamura, MD [facility] 09/12/2018, 9:29 AM

## 2018-09-12 NOTE — Discharge Instructions (Signed)
Nonspecific Chest Pain Chest pain can be caused by many different conditions. Some causes of chest pain can be life-threatening. These will require treatment right away. Serious causes of chest pain include:  Heart attack.  A tear in the body's main blood vessel.  Redness and swelling (inflammation) around your heart.  Blood clot in your lungs. Other causes of chest pain may not be so serious. These include:  Heartburn.  Anxiety or stress.  Damage to bones or muscles in your chest.  Lung infections. Chest pain can feel like:  Pain or discomfort in your chest.  Crushing, pressure, aching, or squeezing pain.  Burning or tingling.  Dull or sharp pain that is worse when you move, cough, or take a deep breath.  Pain or discomfort that is also felt in your back, neck, jaw, shoulder, or arm, or pain that spreads to any of these areas. It is hard to know whether your pain is caused by something that is serious or something that is not so serious. So it is important to see your doctor right away if you have chest pain. Follow these instructions at home: Medicines  Take over-the-counter and prescription medicines only as told by your doctor.  If you were prescribed an antibiotic medicine, take it as told by your doctor. Do not stop taking the antibiotic even if you start to feel better. Lifestyle   Rest as told by your doctor.  Do not use any products that contain nicotine or tobacco, such as cigarettes, e-cigarettes, and chewing tobacco. If you need help quitting, ask your doctor.  Do not drink alcohol.  Make lifestyle changes as told by your doctor. These may include: ? Getting regular exercise. Ask your doctor what activities are safe for you. ? Eating a heart-healthy diet. A diet and nutrition specialist (dietitian) can help you to learn healthy eating options. ? Staying at a healthy weight. ? Treating diabetes or high blood pressure, if needed. ? Lowering your stress.  Activities such as yoga and relaxation techniques can help. General instructions  Pay attention to any changes in your symptoms. Tell your doctor about them or any new symptoms.  Avoid any activities that cause chest pain.  Keep all follow-up visits as told by your doctor. This is important. You may need more testing if your chest pain does not go away. Contact a doctor if:  Your chest pain does not go away.  You feel depressed.  You have a fever. Get help right away if:  Your chest pain is worse.  You have a cough that gets worse, or you cough up blood.  You have very bad (severe) pain in your belly (abdomen).  You pass out (faint).  You have either of these for no clear reason: ? Sudden chest discomfort. ? Sudden discomfort in your arms, back, neck, or jaw.  You have shortness of breath at any time.  You suddenly start to sweat, or your skin gets clammy.  You feel sick to your stomach (nauseous).  You throw up (vomit).  You suddenly feel lightheaded or dizzy.  You feel very weak or tired.  Your heart starts to beat fast, or it feels like it is skipping beats. These symptoms may be an emergency. Do not wait to see if the symptoms will go away. Get medical help right away. Call your local emergency services (911 in the [place] Do not drive yourself to the hospital. Summary  Chest pain can be caused by many different conditions. The  cause may be serious and need treatment right away. If you have chest pain, see your doctor right away.  Follow your doctor's instructions for taking medicines and making lifestyle changes.  Keep all follow-up visits as told by your doctor. This includes visits for any further testing if your chest pain does not go away.  Be sure to know the signs that show that your condition has become worse. Get help right away if you have these symptoms. This information is not intended to replace advice given to you by your health care provider. Make  sure you discuss any questions you have with your health care provider. Document Released: 08/09/2007 Document Revised: 08/23/2017 Document Reviewed: 08/23/2017 Elsevier Patient Education  2020 Elsevier Inc.  DASH Eating Plan DASH stands for "Dietary Approaches to Stop Hypertension." The DASH eating plan is a healthy eating plan that has been shown to reduce high blood pressure (hypertension). It may also reduce your risk for type 2 diabetes, heart disease, and stroke. The DASH eating plan may also help with weight loss. What are tips for following this plan?  General guidelines  Avoid eating more than 2,300 mg (milligrams) of salt (sodium) a day. If you have hypertension, you may need to reduce your sodium intake to 1,500 mg a day.  Limit alcohol intake to no more than 1 drink a day for nonpregnant women and 2 drinks a day for men. One drink equals 12 oz of beer, 5 oz of wine, or 1 oz of hard liquor.  Work with your health care provider to maintain a healthy body weight or to lose weight. Ask what an ideal weight is for you.  Get at least 30 minutes of exercise that causes your heart to beat faster (aerobic exercise) most days of the week. Activities may include walking, swimming, or biking.  Work with your health care provider or diet and nutrition specialist (dietitian) to adjust your eating plan to your individual calorie needs. Reading food labels   Check food labels for the amount of sodium per serving. Choose foods with less than 5 percent of the Daily Value of sodium. Generally, foods with less than 300 mg of sodium per serving fit into this eating plan.  To find whole grains, look for the word "whole" as the first word in the ingredient list. Shopping  Buy products labeled as "low-sodium" or "no salt added."  Buy fresh foods. Avoid canned foods and premade or frozen meals. Cooking  Avoid adding salt when cooking. Use salt-free seasonings or herbs instead of table salt or  sea salt. Check with your health care provider or pharmacist before using salt substitutes.  Do not fry foods. Cook foods using healthy methods such as baking, boiling, grilling, and broiling instead.  Cook with heart-healthy oils, such as olive, canola, soybean, or sunflower oil. Meal planning  Eat a balanced diet that includes: ? 5 or more servings of fruits and vegetables each day. At each meal, try to fill half of your plate with fruits and vegetables. ? Up to 6-8 servings of whole grains each day. ? Less than 6 oz of lean meat, poultry, or fish each day. A 3-oz serving of meat is about the same size as a deck of cards. One egg equals 1 oz. ? 2 servings of low-fat dairy each day. ? A serving of nuts, seeds, or beans 5 times each week. ? Heart-healthy fats. Healthy fats called Omega-3 fatty acids are found in foods such as flaxseeds and coldwater  fish, like sardines, salmon, and mackerel.  Limit how much you eat of the following: ? Canned or prepackaged foods. ? Food that is high in trans fat, such as fried foods. ? Food that is high in saturated fat, such as fatty meat. ? Sweets, desserts, sugary drinks, and other foods with added sugar. ? Full-fat dairy products.  Do not salt foods before eating.  Try to eat at least 2 vegetarian meals each week.  Eat more home-cooked food and less restaurant, buffet, and fast food.  When eating at a restaurant, ask that your food be prepared with less salt or no salt, if possible. What foods are recommended? The items listed may not be a complete list. Talk with your dietitian about what dietary choices are best for you. Grains Whole-grain or whole-wheat bread. Whole-grain or whole-wheat pasta. Brown rice. Orpah Cobbatmeal. Quinoa. Bulgur. Whole-grain and low-sodium cereals. Pita bread. Low-fat, low-sodium crackers. Whole-wheat flour tortillas. Vegetables Fresh or frozen vegetables (raw, steamed, roasted, or grilled). Low-sodium or reduced-sodium  tomato and vegetable juice. Low-sodium or reduced-sodium tomato sauce and tomato paste. Low-sodium or reduced-sodium canned vegetables. Fruits All fresh, dried, or frozen fruit. Canned fruit in natural juice (without added sugar). Meat and other protein foods Skinless chicken or Malawiturkey. Ground chicken or [place]urkey. Pork with fat trimmed off. Fish and seafood. Egg whites. Dried beans, peas, or lentils. Unsalted nuts, nut butters, and seeds. Unsalted canned beans. Lean cuts of beef with fat trimmed off. Low-sodium, lean deli meat. Dairy Low-fat (1%) or fat-free (skim) milk. Fat-free, low-fat, or reduced-fat cheeses. Nonfat, low-sodium ricotta or cottage cheese. Low-fat or nonfat yogurt. Low-fat, low-sodium cheese. Fats and oils Soft margarine without trans fats. Vegetable oil. Low-fat, reduced-fat, or light mayonnaise and salad dressings (reduced-sodium). Canola, safflower, olive, soybean, and sunflower oils. Avocado. Seasoning and other foods Herbs. Spices. Seasoning mixes without salt. Unsalted popcorn and pretzels. Fat-free sweets. What foods are not recommended? The items listed may not be a complete list. Talk with your dietitian about what dietary choices are best for you. Grains Baked goods made with fat, such as croissants, muffins, or some breads. Dry pasta or rice meal packs. Vegetables Creamed or fried vegetables. Vegetables in a cheese sauce. Regular canned vegetables (not low-sodium or reduced-sodium). Regular canned tomato sauce and paste (not low-sodium or reduced-sodium). Regular tomato and vegetable juice (not low-sodium or reduced-sodium). Rosita FirePickles. Olives. Fruits Canned fruit in a light or heavy syrup. Fried fruit. Fruit in cream or butter sauce. Meat and other protein foods Fatty cuts of meat. Ribs. Fried meat. Tomasa BlaseBacon. Sausage. Bologna and other processed lunch meats. Salami. Fatback. Hotdogs. Bratwurst. Salted nuts and seeds. Canned beans with added salt. Canned or smoked fish.  Whole eggs or egg yolks. Chicken or Mal[place]urkey with skin. Dairy Whole or 2% milk, cream, and half-and-half. Whole or full-fat cream cheese. Whole-fat or sweetened yogurt. Full-fat cheese. Nondairy creamers. Whipped toppings. Processed cheese and cheese spreads. Fats and oils Butter. Stick margarine. Lard. Shortening. Ghee. Bacon fat. Tropical oils, such as coconut, palm kernel, or palm oil. Seasoning and other foods Salted popcorn and pretzels. Onion salt, garlic salt, seasoned salt, table salt, and sea salt. Worcestershire sauce. Tartar sauce. Barbecue sauce. Teriyaki sauce. Soy sauce, including reduced-sodium. Steak sauce. Canned and packaged gravies. Fish sauce. Oyster sauce. Cocktail sauce. Horseradish that you find on the shelf. Ketchup. Mustard. Meat flavorings and tenderizers. Bouillon cubes. Hot sauce and Tabasco sauce. Premade or packaged marinades. Premade or packaged taco seasonings. Relishes. Regular salad dressings. Where to find more  information:  National Heart, Lung, and Blood Institute: PopSteam.iswww.nhlbi.nih.gov  American Heart Association: www.heart.org Summary  The DASH eating plan is a healthy eating plan that has been shown to reduce high blood pressure (hypertension). It may also reduce your risk for type 2 diabetes, heart disease, and stroke.  With the DASH eating plan, you should limit salt (sodium) intake to 2,300 mg a day. If you have hypertension, you may need to reduce your sodium intake to 1,500 mg a day.  When on the DASH eating plan, aim to eat more fresh fruits and vegetables, whole grains, lean proteins, low-fat dairy, and heart-healthy fats.  Work with your health care provider or diet and nutrition specialist (dietitian) to adjust your eating plan to your individual calorie needs. This information is not intended to replace advice given to you by your health care provider. Make sure you discuss any questions you have with your health care provider. Document Released:  02/09/2011 Document Revised: 02/02/2017 Document Reviewed: 02/14/2016 Elsevier Patient Education  2020 [employer]  Hypertension, Adult Hypertension is another name for high blood pressure. High blood pressure forces your heart to work harder to pump blood. This can cause problems over time. There are two numbers in a blood pressure reading. There is a top number (systolic) over a bottom number (diastolic). It is best to have a blood pressure that is below 120/80. Healthy choices can help lower your blood pressure, or you may need medicine to help lower it. What are the causes? The cause of this condition is not known. Some conditions may be related to high blood pressure. What increases the risk?  Smoking.  Having type 2 diabetes mellitus, high cholesterol, or both.  Not getting enough exercise or physical activity.  Being overweight.  Having too much fat, sugar, calories, or salt (sodium) in your diet.  Drinking too much alcohol.  Having long-term (chronic) kidney disease.  Having a family history of high blood pressure.  Age. Risk increases with age.  Race. You may be at higher risk if you are African American.  Gender. Men are at higher risk than women before age 67. After age 67, women are at higher risk than men.  Having obstructive sleep apnea.  Stress. What are the signs or symptoms?  High blood pressure may not cause symptoms. Very high blood pressure (hypertensive crisis) may cause: ? Headache. ? Feelings of worry or nervousness (anxiety). ? Shortness of breath. ? Nosebleed. ? A feeling of being sick to your stomach (nausea). ? Throwing up (vomiting). ? Changes in how you see. ? Very bad chest pain. ? Seizures. How is this treated?  This condition is treated by making healthy lifestyle changes, such as: ? Eating healthy foods. ? Exercising more. ? Drinking less alcohol.  Your health care provider may prescribe medicine if lifestyle changes are not  enough to get your blood pressure under control, and if: ? Your top number is above 130. ? Your bottom number is above 80.  Your personal target blood pressure may vary. Follow these instructions at home: Eating and drinking   If told, follow the DASH eating plan. To follow this plan: ? Fill one half of your plate at each meal with fruits and vegetables. ? Fill one fourth of your plate at each meal with whole grains. Whole grains include whole-wheat pasta, brown rice, and whole-grain bread. ? Eat or drink low-fat dairy products, such as skim milk or low-fat yogurt. ? Fill one fourth of your plate at  each meal with low-fat (lean) proteins. Low-fat proteins include fish, chicken without skin, eggs, beans, and tofu. ? Avoid fatty meat, cured and processed meat, or chicken with skin. ? Avoid pre-made or processed food.  Eat less than 1,500 mg of salt each day.  Do not drink alcohol if: ? Your doctor tells you not to drink. ? You are pregnant, may be pregnant, or are planning to become pregnant.  If you drink alcohol: ? Limit how much you use to:  0-1 drink a day for women.  0-2 drinks a day for men. ? Be aware of how much alcohol is in your drink. In the [place] one drink equals one 12 oz bottle of beer (355 mL), one 5 oz glass of wine (148 mL), or one 1 oz glass of hard liquor (44 mL). Lifestyle   Work with your doctor to stay at a healthy weight or to lose weight. Ask your doctor what the best weight is for you.  Get at least 30 minutes of exercise most days of the week. This may include walking, swimming, or biking.  Get at least 30 minutes of exercise that strengthens your muscles (resistance exercise) at least 3 days a week. This may include lifting weights or doing Pilates.  Do not use any products that contain nicotine or tobacco, such as cigarettes, e-cigarettes, and chewing tobacco. If you need help quitting, ask your doctor.  Check your blood pressure at home as told by  your doctor.  Keep all follow-up visits as told by your doctor. This is important. Medicines  Take over-the-counter and prescription medicines only as told by your doctor. Follow directions carefully.  Do not skip doses of blood pressure medicine. The medicine does not work as well if you skip doses. Skipping doses also puts you at risk for problems.  Ask your doctor about side effects or reactions to medicines that you should watch for. Contact a doctor if you:  Think you are having a reaction to the medicine you are taking.  Have headaches that keep coming back (recurring).  Feel dizzy.  Have swelling in your ankles.  Have trouble with your vision. Get help right away if you:  Get a very bad headache.  Start to feel mixed up (confused).  Feel weak or numb.  Feel faint.  Have very bad pain in your: ? Chest. ? Belly (abdomen).  Throw up more than once.  Have trouble breathing. Summary  Hypertension is another name for high blood pressure.  High blood pressure forces your heart to work harder to pump blood.  For most people, a normal blood pressure is less than 120/80.  Making healthy choices can help lower blood pressure. If your blood pressure does not get lower with healthy choices, you may need to take medicine. This information is not intended to replace advice given to you by your health care provider. Make sure you discuss any questions you have with your health care provider. Document Released: 08/09/2007 Document Revised: 10/31/2017 Document Reviewed: 10/31/2017 Elsevier Patient Education  2020 [employer].

## 2018-09-13 ENCOUNTER — Inpatient Hospital Stay
Admission: AD | Admit: 2018-09-13 | Discharge: 2018-09-17 | DRG: 885 | Disposition: A | Discharge status: Home / Self Care | Payer: Medicare Other | Source: Intra-hospital | Attending: Psychiatry | Admitting: Psychiatry

## 2018-09-13 ENCOUNTER — Other Ambulatory Visit: Payer: Self-pay

## 2018-09-13 DIAGNOSIS — F200 Paranoid schizophrenia: Secondary | ICD-10-CM | POA: Diagnosis not present

## 2018-09-13 DIAGNOSIS — Z6838 Body mass index (BMI) 38.0-38.9, adult: Secondary | ICD-10-CM | POA: Diagnosis not present

## 2018-09-13 DIAGNOSIS — N182 Chronic kidney disease, stage 2 (mild): Secondary | ICD-10-CM | POA: Diagnosis present

## 2018-09-13 DIAGNOSIS — F22 Delusional disorders: Secondary | ICD-10-CM | POA: Diagnosis present

## 2018-09-13 DIAGNOSIS — I119 Hypertensive heart disease without heart failure: Secondary | ICD-10-CM

## 2018-09-13 DIAGNOSIS — F209 Schizophrenia, unspecified: Secondary | ICD-10-CM | POA: Diagnosis present

## 2018-09-13 DIAGNOSIS — K219 Gastro-esophageal reflux disease without esophagitis: Secondary | ICD-10-CM | POA: Diagnosis present

## 2018-09-13 DIAGNOSIS — F203 Undifferentiated schizophrenia: Secondary | ICD-10-CM

## 2018-09-13 DIAGNOSIS — I350 Nonrheumatic aortic (valve) stenosis: Secondary | ICD-10-CM | POA: Diagnosis present

## 2018-09-13 DIAGNOSIS — I739 Peripheral vascular disease, unspecified: Secondary | ICD-10-CM | POA: Diagnosis present

## 2018-09-13 DIAGNOSIS — Z9114 Patient's other noncompliance with medication regimen: Secondary | ICD-10-CM | POA: Diagnosis not present

## 2018-09-13 DIAGNOSIS — Z79899 Other long term (current) drug therapy: Secondary | ICD-10-CM

## 2018-09-13 DIAGNOSIS — E669 Obesity, unspecified: Secondary | ICD-10-CM | POA: Diagnosis present

## 2018-09-13 DIAGNOSIS — Z87891 Personal history of nicotine dependence: Secondary | ICD-10-CM

## 2018-09-13 DIAGNOSIS — Z8249 Family history of ischemic heart disease and other diseases of the circulatory system: Secondary | ICD-10-CM

## 2018-09-13 DIAGNOSIS — I422 Other hypertrophic cardiomyopathy: Secondary | ICD-10-CM | POA: Diagnosis present

## 2018-09-13 DIAGNOSIS — I129 Hypertensive chronic kidney disease with stage 1 through stage 4 chronic kidney disease, or unspecified chronic kidney disease: Secondary | ICD-10-CM | POA: Diagnosis present

## 2018-09-13 DIAGNOSIS — I2510 Atherosclerotic heart disease of native coronary artery without angina pectoris: Secondary | ICD-10-CM | POA: Diagnosis present

## 2018-09-13 DIAGNOSIS — I420 Dilated cardiomyopathy: Secondary | ICD-10-CM | POA: Diagnosis present

## 2018-09-13 DIAGNOSIS — I10 Essential (primary) hypertension: Secondary | ICD-10-CM | POA: Diagnosis present

## 2018-09-13 LAB — GC/CHLAMYDIA PROBE AMP (~~LOC~~) NOT AT ARMC
Chlamydia: NEGATIVE
Neisseria Gonorrhea: NEGATIVE

## 2018-09-13 MED ORDER — ISOSORBIDE MONONITRATE ER 60 MG PO TB24
60.0000 mg | ORAL_TABLET | Freq: Every day | ORAL | Status: DC
  Administered 2018-09-13: 60 mg via ORAL
  Filled 2018-09-13: qty 1

## 2018-09-13 MED ORDER — HYDRALAZINE HCL 10 MG PO TABS
10.0000 mg | ORAL_TABLET | Freq: Three times a day (TID) | ORAL | Status: DC
  Administered 2018-09-13: 10 mg via ORAL
  Filled 2018-09-13: qty 1

## 2018-09-13 MED ORDER — SENNOSIDES-DOCUSATE SODIUM 8.6-50 MG PO TABS
1.0000 | ORAL_TABLET | Freq: Every evening | ORAL | Status: DC | PRN
  Filled 2018-09-13: qty 1

## 2018-09-13 MED ORDER — CARVEDILOL 3.125 MG PO TABS: 3.1250 mg | ORAL_TABLET | Freq: Two times a day (BID) | ORAL | 0 refills | Status: DC

## 2018-09-13 MED ORDER — ACETAMINOPHEN 325 MG PO TABS
650.0000 mg | ORAL_TABLET | Freq: Four times a day (QID) | ORAL | Status: DC | PRN
  Administered 2018-09-13: 650 mg via ORAL
  Filled 2018-09-13: qty 2

## 2018-09-13 MED ORDER — ALUM & MAG HYDROXIDE-SIMETH 200-200-20 MG/5ML PO SUSP: 30.0000 mL | ORAL | Status: DC | PRN

## 2018-09-13 MED ORDER — HYDRALAZINE HCL 50 MG PO TABS
100.0000 mg | ORAL_TABLET | Freq: Three times a day (TID) | ORAL | Status: DC
  Administered 2018-09-13 – 2018-09-17 (×13): 100 mg via ORAL
  Filled 2018-09-13 (×15): qty 2

## 2018-09-13 MED ORDER — CARVEDILOL 12.5 MG PO TABS
25.0000 mg | ORAL_TABLET | Freq: Two times a day (BID) | ORAL | Status: DC
  Administered 2018-09-14: 25 mg via ORAL
  Filled 2018-09-13: qty 2

## 2018-09-13 MED ORDER — OLANZAPINE 5 MG PO TABS
5.0000 mg | ORAL_TABLET | Freq: Two times a day (BID) | ORAL | Status: DC
  Administered 2018-09-13 – 2018-09-14 (×2): 5 mg via ORAL
  Filled 2018-09-13 (×2): qty 1

## 2018-09-13 MED ORDER — ISOSORBIDE MONONITRATE ER 60 MG PO TB24: 60.0000 mg | ORAL_TABLET | Freq: Every day | ORAL | 0 refills | Status: DC

## 2018-09-13 MED ORDER — ONDANSETRON HCL 4 MG PO TABS: 4.0000 mg | ORAL_TABLET | Freq: Four times a day (QID) | ORAL | Status: DC | PRN

## 2018-09-13 MED ORDER — HYDRALAZINE HCL 10 MG PO TABS: 10.0000 mg | ORAL_TABLET | Freq: Three times a day (TID) | ORAL | 0 refills | Status: DC

## 2018-09-13 MED ORDER — MAGNESIUM HYDROXIDE 400 MG/5ML PO SUSP: 30.0000 mL | Freq: Every day | ORAL | Status: DC | PRN

## 2018-09-13 MED ORDER — HYDROXYZINE HCL 25 MG PO TABS: 25.0000 mg | ORAL_TABLET | Freq: Three times a day (TID) | ORAL | Status: DC | PRN

## 2018-09-13 MED ORDER — OLANZAPINE 5 MG PO TABS: 5.0000 mg | ORAL_TABLET | Freq: Two times a day (BID) | ORAL | 0 refills | Status: DC

## 2018-09-13 MED ORDER — ISOSORBIDE MONONITRATE ER 30 MG PO TB24
90.0000 mg | ORAL_TABLET | Freq: Every day | ORAL | Status: DC
  Administered 2018-09-13 – 2018-09-17 (×5): 90 mg via ORAL
  Filled 2018-09-13 (×5): qty 3

## 2018-09-13 MED ORDER — CARVEDILOL 6.25 MG PO TABS
3.1250 mg | ORAL_TABLET | Freq: Two times a day (BID) | ORAL | Status: DC
  Administered 2018-09-13: 3.125 mg via ORAL
  Filled 2018-09-13: qty 1

## 2018-09-13 MED ORDER — PANTOPRAZOLE SODIUM 40 MG PO TBEC
40.0000 mg | DELAYED_RELEASE_TABLET | Freq: Every day | ORAL | Status: DC
  Administered 2018-09-13 – 2018-09-17 (×5): 40 mg via ORAL
  Filled 2018-09-13 (×5): qty 1

## 2018-09-13 MED ORDER — CLONIDINE HCL 0.1 MG PO TABS
0.1000 mg | ORAL_TABLET | Freq: Two times a day (BID) | ORAL | Status: DC
  Administered 2018-09-13: 0.1 mg via ORAL
  Filled 2018-09-13: qty 1

## 2018-09-13 MED ORDER — FAMOTIDINE 20 MG PO TABS
20.0000 mg | ORAL_TABLET | Freq: Every day | ORAL | Status: DC
  Administered 2018-09-13 – 2018-09-16 (×4): 20 mg via ORAL
  Filled 2018-09-13 (×4): qty 1

## 2018-09-13 NOTE — Plan of Care (Signed)
  Problem: Education: Goal: Knowledge of [facility] Education information/materials will improve Outcome: Not Progressing Goal: Emotional status will improve Outcome: Not Progressing Goal: Mental status will improve Outcome: Not Progressing Goal: Verbalization of understanding the information provided will improve Outcome: Not Progressing   Problem: Coping: Goal: Ability to verbalize frustrations and anger appropriately will improve Outcome: Not Progressing Goal: Ability to demonstrate self-control will improve Outcome: Not Progressing   Problem: Health Behavior/Discharge Planning: Goal: Compliance with treatment plan for underlying cause of condition will improve Outcome: Not Progressing   Problem: Activity: Goal: Will verbalize the importance of balancing activity with adequate rest periods Outcome: Not Progressing   Problem: Education: Goal: Will be free of psychotic symptoms Outcome: Not Progressing Goal: Knowledge of the prescribed therapeutic regimen will improve Outcome: Not Progressing   Problem: Coping: Goal: Coping ability will improve Outcome: Not Progressing   Problem: Health Behavior/Discharge Planning: Goal: Compliance with prescribed medication regimen will improve Outcome: Not Progressing

## 2018-09-13 NOTE — Progress Notes (Signed)
Called [facility] to give report. Was told the staff was in a meeting and RN was asked to call back in 15-20 minutes.  RN will do so.

## 2018-09-13 NOTE — Progress Notes (Signed)
Pt arrived on the unit. She is a 29yof who lives with her son. She denies SI, HI and AVH. She said the reason she is here is "drama". In report from the nurse at [facility] it says  that she had a hypertensive crisis and also  become psychotic thinking she was having "contractions".She has been non compliant with her medicines.  She was oriented to the unit. Pt was educated on care plan. Pt will be monitored and kept safe. Collier Bullock RN

## 2018-09-13 NOTE — Progress Notes (Signed)
RN called and gave handoff report to Collier Bullock, RN at [facility].  RN will call back to notify receiving RN when patient is on her way to [facility].

## 2018-09-13 NOTE — Progress Notes (Addendum)
RN removed patient IV which was intact and called telemetry to notify them of patient being discharged from [facility].  NT helped patient clean up and get dressed.  Sheriff gathered patient belongings and necessary paperwork for IVC transfer to [facility], then wheeled patient to her waiting sheriff Lucianne Lei.  Nurse sitter accompanied patient to the [place]. Patient was calm and cooperative.

## 2018-09-13 NOTE — [facility] Group Notes (Signed)
Feelings Around Relapse 09/13/2018 1PM  Type of Therapy and Topic:  Group Therapy:  Feelings around Relapse and Recovery  Participation Level:  Did Not Attend   Description of Group:    Patients in this group will discuss emotions they experience before and after a relapse. They will process how experiencing these feelings, or avoidance of experiencing them, relates to having a relapse. Facilitator will guide patients to explore emotions they have related to recovery. Patients will be encouraged to process which emotions are more powerful. They will be guided to discuss the emotional reaction significant others in their lives may have to patients' relapse or recovery. Patients will be assisted in exploring ways to respond to the emotions of others without this contributing to a relapse.  Therapeutic Goals: 1. Patient will identify two or more emotions that lead to a relapse for them 2. Patient will identify two emotions that result when they relapse 3. Patient will identify two emotions related to recovery 4. Patient will demonstrate ability to communicate their needs through discussion and/or role plays   Summary of Patient Progress:     Therapeutic Modalities:   Cognitive Behavioral Therapy Solution-Focused Therapy Assertiveness Training Relapse Prevention Therapy   Yvette Rack, LCSW 09/13/2018 1:46 PM

## 2018-09-13 NOTE — H&P (Signed)
Psychiatric Admission Assessment Adult  Patient Identification: Erin Riley MRN:  [id number] Date of Evaluation:  09/13/2018 Chief Complaint:  Scizophrenia Principal Diagnosis: Schizophrenia ([place]) Diagnosis:  Principal Problem:   Schizophrenia ([place]) Active Problems:   Essential hypertension   Obesity (BMI 30-39.9)   Hypertensive hypertrophic cardiomyopathy: NYHA class II:    History of Present Illness: Patient seen and chart reviewed.  68 year old woman with a past history of schizophrenia transferred to [place] from [place].  Patient was only partially cooperative with the interview much of the history was obtained from the chart.  It looks like she presented to the emergency room in [place] with complaints of burning in her urine and some other medical concerns.  While she was being evaluated it became apparent that first of all her blood pressure was badly controlled and second of all that she was delusional.  Patient was initially admitted to the medical service for evaluation of her hypertension and how it was affecting her cardiomyopathy.  Once she was cleared medically she was seen by psychiatry who felt she needed inpatient treatment.  It was documented that the patient continued to state that she was pregnant and that frequently she would yell at people.  On interview today the patient tells me that I should know everything if I just read the chart.  Goes on to tell me she came to the hospital because she was off her blood pressure medicine.  She denies any belief that she is pregnant.  Denies any hallucinations.  Denies any mood symptoms denies suicidal or homicidal ideation.  She is rather dismissive and on helpful in discussing her social situation but says she still lives with her son.  Denies alcohol or drug abuse Associated Signs/Symptoms: Depression Symptoms:  difficulty concentrating, (Hypo) Manic Symptoms:  Irritable Mood, Anxiety Symptoms:  None reported Psychotic Symptoms:   Paranoia, PTSD Symptoms: Negative Total Time spent with patient: 1 hour  Past Psychiatric History: Patient has been admitted to psychiatric hospital several times previously.  In 2019 she was [facility].  Eventually stabilized on Zyprexa although it sounds like even at her best her baseline continued to be somewhat disorganized.  Since then she has been to the emergency room several times usually for medical complaints and on at least a couple of those has been referred to psychiatric hospitals.  No known history of suicide attempts or violence.  Looks like Zyprexa has been used on several occasions with some benefit.  Patient not willing to discuss any outpatient treatment not clear whether she goes for any.  Is the patient at risk to self? No.  Has the patient been a risk to self in the past 6 months? No.  Has the patient been a risk to self within the distant past? No.  Is the patient a risk to others? No.  Has the patient been a risk to others in the past 6 months? No.  Has the patient been a risk to others within the distant past? No.   Prior Inpatient Therapy:   Prior Outpatient Therapy:    Alcohol Screening: 1. How often do you have a drink containing alcohol?: Never 2. How many drinks containing alcohol do you have on a typical day when you are drinking?: 1 or 2 3. How often do you have six or more drinks on one occasion?: Never AUDIT-C Score: 0 4. How often during the last year have you found that you were not able to stop drinking once you had  started?: Never 5. How often during the last year have you failed to do what was normally expected from you becasue of drinking?: Never 6. How often during the last year have you needed a first drink in the morning to get yourself going after a heavy drinking session?: Never 7. How often during the last year have you had a feeling of guilt of remorse after drinking?: Never 8. How often during the last year have you been  unable to remember what happened the night before because you had been drinking?: Never 9. Have you or someone else been injured as a result of your drinking?: No 10. Has a relative or friend or a doctor or another health worker been concerned about your drinking or suggested you cut down?: No Alcohol Use Disorder Identification Test Final Score (AUDIT): 0 Alcohol Brief Interventions/Follow-up: AUDIT Score <7 follow-up not indicated Substance Abuse History in the last 12 months:  No. Consequences of Substance Abuse: Negative Previous Psychotropic Medications: Yes  Psychological Evaluations: Yes  Past Medical History:  Past Medical History:  Diagnosis Date  . Chronic back pain   . Chronic kidney disease (CKD), stage II (mild)    Class I-II  . Coronary artery disease 04/2009   50% stenosis in the perforator of LAD; catheterization was for an abnormal Myoview in January 2000 showing anterior and inferolateral ischemia.  . Diverticulitis   . 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 > 3 subsequent Echos show improved/normal EF with moderate to severe concentric LVH and diastolic dysfunction with LVOT/intracavitary gradient --> 06/2016: Severe LVH.  Vigorous EF, 65-70%.?? Gr 1 DD. Mild AS.  [name] Hyperlipidemia   . Hypertension   . Hypertensive hypertrophic cardiomyopathy: NYHA class II:  Echo: Severe concentric LVH with LV OT gradient; essentially preserved EF with diastolic dysfunction 02/15/2013   Echo 06/2016: Severe Concentric LVH. Vigorous EF 65-70%. ~ Gr I DD.   . Mild aortic stenosis by prior echocardiography    Echo 06/2016: Mild AS (Mean Gradient 15 mmHg); has had prior Mod-Severe MR (not seen on current echo)  . PAD (peripheral artery disease) [facility]) March 2013   Lower extremity Dopplers: R. SFA 50-60%, R. PTA proximally occluded with distal reconstitution;; L. common iliac ~50%, L. SFA 50-70%  stenosis, L. PTA < 50%  . Schizophrenia [facility])     Past Surgical History:  Procedure Laterality Date  . BUNIONECTOMY    . carotid doppler  05/29/2011   left bulb/prox ICA moderate amtfibrous plaque with no evidence significant reduction.,right bulb /proximal ICA normal patency  . lower extremity doppler  05/29/2011   right SFA 50% to 59% diameter reduction,right posterior tibal atreery occlusive disease,reconstituting distally, left common illiac<50%,left SFA 50 to70%,left post. tibial <50%  . NM MYOCAR PERF WALL MOTION  03/2009   Persantine; EF 51%-both anterior and inferolateral ischemia  . TRANSTHORACIC ECHOCARDIOGRAM  06/2016   Severe LVH.  Vigorous EF of 65-70%.  No RWMA. ~Only grade 1 diastolic dysfunction.  Mild aortic stenosis (mean gradient 15 mmHg)  . TRANSTHORACIC ECHOCARDIOGRAM  07/2012   EF 50-55%; severe concentric LVH; only grade 1 diastolic dysfunction. Mild aortic sclerosis - with LVOT /intracavitary gradient of roughly 20 mmHg mean. Mild to moderately dilated LA;; previously reported MR not seen   Family History:  Family History  Problem Relation Age of Onset  . Hypertension Mother   . Breast cancer Neg Hx    Family  Psychiatric  History: Patient denies any Tobacco Screening: Have you used any form of tobacco in the last 30 days? (Cigarettes, Smokeless Tobacco, Cigars, and/or Pipes): No Social History:  Social History   Substance and Sexual Activity  Alcohol Use No     Social History   Substance and Sexual Activity  Drug Use No    Additional Social History:                           Allergies:  No Known Allergies Lab Results:  Results for orders placed or performed during the hospital encounter of 09/11/18 (from the past 48 hour(s))  Basic metabolic panel     Status: Abnormal   Collection Time: 09/12/18  4:56 AM  Result Value Ref Range   Sodium 137 135 - 145 mmol/L   Potassium 3.6 3.5 - 5.1 mmol/L   Chloride 105 98 - 111 mmol/L   CO2 25 22 -  32 mmol/L   Glucose, Bld 115 (H) 70 - 99 mg/dL   BUN 22 8 - 23 mg/dL   Creatinine, Ser 1.611.42 (H) 0.44 - 1.00 mg/dL   Calcium 9.3 8.9 - 09.610.3 mg/dL   GFR calc non Af Amer 38 (L) >60 mL/min   GFR calc Af Amer 44 (L) >60 mL/min   Anion gap 7 5 - 15    Comment: Performed at [facility], [address] [place], [place] [address]  CBC     Status: None   Collection Time: 09/12/18  4:56 AM  Result Value Ref Range   WBC 7.2 4.0 - 10.5 K/uL   RBC 4.02 3.87 - 5.11 MIL/uL   Hemoglobin 12.6 12.0 - 15.0 g/dL   HCT 98.138.5 19.136.0 - 47.846.0 %   MCV 95.8 80.0 - 100.0 fL   MCH 31.3 26.0 - 34.0 pg   MCHC 32.7 30.0 - 36.0 g/dL   RDW 29.513.4 62.111.5 - 30.815.5 %   Platelets 265 150 - 400 K/uL   nRBC 0.0 0.0 - 0.2 %    Comment: Performed at [facility], [address] [place], [place] [address]    Blood Alcohol level:  Lab Results  Component Value Date   ETH <10 04/18/2018   ETH <10 10/04/2017    Metabolic Disorder Labs:  Lab Results  Component Value Date   HGBA1C 5.9 (H) 06/09/2016   MPG 123 06/09/2016   MPG 105 07/31/2012   Lab Results  Component Value Date   PROLACTIN 16.5 06/09/2016   Lab Results  Component Value Date   CHOL 123 08/31/2017   TRIG 120 08/31/2017   HDL 40 08/31/2017   CHOLHDL 3.1 08/31/2017   VLDL 55 (H) 06/09/2016   LDLCALC 59 08/31/2017   LDLCALC 80 06/09/2016    Current Medications: Current Facility-Administered Medications  Medication Dose Route Frequency Provider Last Rate Last Dose  . acetaminophen (TYLENOL) tablet 650 mg  650 mg Oral Q6H PRN Money, Gerlene Burdockravis B, FNP   650 mg at 09/13/18 1500  . alum & mag hydroxide-simeth (MAALOX/MYLANTA) 200-200-20 MG/5ML suspension 30 mL  30 mL Oral Q4H PRN Money, Gerlene Burdockravis B, FNP      . [START ON 09/14/2018] carvedilol (COREG) tablet 25 mg  25 mg Oral BID WC Clapacs, John T, MD      . famotidine (PEPCID) tablet 20 mg  20 mg Oral QHS Money, Gerlene Burdockravis B, FNP      . hydrALAZINE (APRESOLINE) tablet 100 mg  100 mg Oral Q8H Money, Feliz Beamravis B, FNP   100 mg at 09/13/18 1459  . hydrOXYzine (ATARAX/VISTARIL) tablet 25 mg  25 mg Oral TID PRN Money, Gerlene Burdockravis B, FNP      . isosorbide mononitrate (IMDUR) 24 hr tablet 90 mg  90 mg Oral Daily Money, Travis B, FNP   90 mg at 09/13/18 1459  . magnesium hydroxide (MILK OF MAGNESIA) suspension 30 mL  30 mL Oral Daily PRN Money, Gerlene Burdockravis B, FNP      . OLANZapine (ZYPREXA) tablet 5 mg  5 mg Oral BID Money, Gerlene Burdockravis B, FNP   5 mg at 09/13/18 1712  . ondansetron (ZOFRAN) tablet 4 mg  4 mg Oral Q6H PRN Money, Gerlene Burdockravis B, FNP      . pantoprazole (PROTONIX) EC tablet 40 mg  40 mg Oral Daily Clapacs, John T, MD      . senna-docusate (Senokot-S) tablet 1 tablet  1 tablet Oral QHS PRN Money, Gerlene Burdockravis B, FNP       PTA Medications: Medications Prior to Admission  Medication Sig Dispense Refill Last Dose  . carvedilol (COREG) 3.125 MG tablet Take 1 tablet (3.125 mg total) by mouth 2 (two) times daily with a meal. 60 tablet 0   . cloNIDine (CATAPRES) 0.1 MG tablet Take 1 tablet (0.1 mg total) by mouth 2 (two) times daily. 60 tablet 0   . hydrALAZINE (APRESOLINE) 10 MG tablet Take 1 tablet (10 mg total) by mouth every 8 (eight) hours. 90 tablet 0   . hydrOXYzine (ATARAX/VISTARIL) 25 MG tablet Take 1 tablet (25 mg total) by mouth 4 (four) times daily. 30 tablet 0   . isosorbide mononitrate (IMDUR) 60 MG 24 hr tablet Take 1 tablet (60 mg total) by mouth daily. 30 tablet 0   . OLANZapine (ZYPREXA) 5 MG tablet Take 1 tablet (5 mg total) by mouth 2 (two) times a day. 60 tablet 0     Musculoskeletal: Strength & Muscle Tone: within normal limits Gait & Station: normal Patient leans: N/A  Psychiatric Specialty Exam: Physical Exam  Nursing note and vitals reviewed. Constitutional: She appears well-developed and well-nourished.  HENT:  Head: Normocephalic and atraumatic.  Eyes: Pupils are equal, round, and reactive to light. Conjunctivae are normal.  Neck: Normal range of motion.   Cardiovascular: Regular rhythm and normal heart sounds.  Respiratory: Effort normal. No respiratory distress.  GI: Soft.  Musculoskeletal: Normal range of motion.  Neurological: She is alert.  Skin: Skin is warm and dry.  Psychiatric: Her affect is blunt. Her speech is delayed and tangential. She is agitated. She is not aggressive. Thought content is paranoid. She expresses impulsivity. She expresses no homicidal and no suicidal ideation. She exhibits abnormal recent memory.    Review of Systems  Constitutional: Negative.   HENT: Negative.   Eyes: Negative.   Respiratory: Negative.   Cardiovascular: Negative.   Gastrointestinal: Negative.   Musculoskeletal: Negative.   Skin: Negative.   Neurological: Negative.   Psychiatric/Behavioral: Negative for depression, hallucinations, memory loss, substance abuse and suicidal ideas. The patient is not nervous/anxious and does not have insomnia.     Blood pressure (!) 144/85, pulse 90, temperature 97.7 F (36.5 C), temperature source Oral, resp. rate 18, height 5\' 8"  (1.727 m), weight 113.4 kg, last menstrual period 05/10/2013, SpO2 99 %.Body mass index is 38.01 kg/m.  General Appearance: Casual  Eye Contact:  Good  Speech:  Slow  Volume:  Decreased  Mood:  Irritable  Affect:  Congruent  Thought  Process:  Disorganized  Orientation:  Negative  Thought Content:  Illogical, Paranoid Ideation and Rumination  Suicidal Thoughts:  No  Homicidal Thoughts:  No  Memory:  Immediate;   Fair Recent;   Poor Remote;   Poor  Judgement:  Impaired  Insight:  Shallow  Psychomotor Activity:  Decreased  Concentration:  Concentration: Fair  Recall:  [employer]Fair  Fund of Knowledge:  Fair  Language:  Fair  Akathisia:  No  Handed:  Right  AIMS (if indicated):     Assets:  Financial Resources/Insurance Housing  ADL's:  Intact  Cognition:  Impaired,  Mild  Sleep:       Treatment Plan Summary: Daily contact with patient to assess and evaluate symptoms  and progress in treatment, Medication management and Plan 68 year old woman with schizophrenia.  Off of medicine.  [place] some delusions [place].  Patient is slightly uncooperative but not violent or threatening.  No sign of her being suicidal or dangerous other than being noncompliant with her blood pressure medicine despite having cardiomyopathy and extremely high blood pressure.  Patient is here in the psychiatric ward.  Continue blood pressure medicine.  Continue Zyprexa twice a day.  I tried to reach her son and was not able to get anyone on the phone.  We will try to work on disposition as soon as possible  Observation Level/Precautions:  15 minute checks  Laboratory:  Chemistry Profile  Psychotherapy:    Medications:    Consultations:    Discharge Concerns:    Estimated LOS:  Other:     Physician Treatment Plan for Primary Diagnosis: Schizophrenia (HCC) Long Term Goal(s): Improvement in symptoms so as ready for discharge  Short Term Goals: Ability to verbalize feelings will improve and Ability to demonstrate self-control will improve  Physician Treatment Plan for Secondary Diagnosis: Principal Problem:   Schizophrenia ([facility]) Active Problems:   Essential hypertension   Obesity (BMI 30-39.9)   Hypertensive hypertrophic cardiomyopathy: NYHA class II:    Long Term Goal(s): Improvement in symptoms so as ready for discharge  Short Term Goals: Compliance with prescribed medications will improve  I certify that inpatient services furnished can reasonably be expected to improve the patient's condition.    Mordecai RasmussenJohn Clapacs, MD 7/10/20205:58 PM

## 2018-09-13 NOTE — Discharge Summary (Signed)
Discharge Summary  Erin LoraMae H Scatena JXB:[id number] DOB: [birth year]  PCP: Patient, No Pcp Per  Admit date: 09/11/2018 Discharge date: 09/13/2018  Time spent: 35 minutes  Recommendations for Outpatient Follow-up:  1. [facility]. 2. The patient is medically stable for discharge to [facility].  Discharge Diagnoses:  Active Hospital Problems   Diagnosis Date Noted  . 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 Chest pain 09/11/2018  . Hypertensive crisis 09/11/2018  . Obesity (BMI 30-39.9) 02/15/2013  . Left ventricular diastolic dysfunction, NYHA class 1   . Hyperlipidemia with target LDL less than 100   . CKD (chronic kidney disease) stage 3, GFR 30-59 ml/min (HCC) 08/01/2012  . Essential hypertension 05/15/2012  . PAD (peripheral artery disease) (HCC) 05/05/2011    Resolved Hospital Problems  No resolved problems to display.    Discharge Condition: Stable  Diet recommendation: Resume previous diet  Vitals:   09/12/18 2120 09/13/18 0557  BP: 128/66 (!) 164/94  Pulse: 65 73  Resp: 18 18  Temp: 98.6 F (37 C) 98.6 F (37 C)  SpO2: 96% 99%    History of present illness:  Erin Riley is a 68 y.o. female with medical history significant for paranoid schizophrenia, CAD, CKD, Hypertension, presents to the ED complaining that she is here in the ED because her water broke and she is having contractions, about to have a baby.  Patient has been psychotic, paranoid and mentally unstable.  Patient was found talking to herself, being somewhat aggressive and yelling at staff members.  Patient reports some left-sided breast pain, unable to characterize the pain further.  Patient stated she has not been taking her medication for the last couple of weeks because someone has been harassing her.  Patient is a very poor historian likely due to her psychosis.  ED Course: Patient noted to be in hypertensive crisis, troponins noted to be mildly elevated with some new T wave changes  on her EKG. cardiology was consulted.  Patient initially refusing to be admitted, had to be IVC'ed.  UDS negative, CT head no acute intracranial abnormalities.  Patient admitted for further management.  09/13/18: Patient seen and examined at her bedside.  She has no new complaints.  She has tangential speech.  [facility].     Hospital Course:  Principal Problem:   Paranoid schizophrenia (HCC) Active Problems:   Essential hypertension   CKD (chronic kidney disease) stage 3, GFR 30-59 ml/min (HCC)   Obesity (BMI 30-39.9)   PAD (peripheral artery disease) (HCC)   Left ventricular diastolic dysfunction, NYHA class 1   Hyperlipidemia with target LDL less than 100   Chest pain   Hypertensive crisis  Hypertensive crisis likely secondary to medication noncompliance On presentation BP noted to be elevated SBP 200s, DBP 100s Continue antihypertensive medications, titrate up as needed. Currently on Coreg, clonidine, hydralazine, and Imdur UDS negative CT head negative for any acute intracranial changes Last TSH was 0.717 on 04/18/2018.  Elevated troponin/EKG changes/history of CAD Resolved left-sided breast/chest pain Troponin mildly elevated, peaked at 24 and flat EKG with T wave changes, present previously Echo with normal EF Cardiology consulted no plan for further cardiac work-up.  CKD stage III Continue to avoid nephrotoxins Avoid dehydration   Paranoid schizophrenia Psychiatry recommended Zyprexa 5 mg twice daily Greatly appreciate psychiatry assistance. Discharged to [facility] to continue psychiatric care.  Obesity Lifestyle modification advised     Code Status: Full  Consults called: Cardiology, psychiatry    Discharge Exam: BP (!) 164/94 (BP Location: Right Arm)   Pulse 73   Temp 98.6 F (37 C) (Oral)   Resp 18   Ht 5\' 8"  (1.727 m)   Wt 101.9 kg   LMP 05/10/2013   SpO2 99%   BMI 34.17 kg/m   . General: 68 y.o. year-old female well-developed well-nourished in no acute distress.  Alert with tangential speech.   . Cardiovascular: Regular rate and rhythm with no rubs or gallops.  No JVD or thyromegaly noted.   [name] Respiratory: Clear to auscultation with no wheezes or rales.  Poor inspiratory effort. . Abdomen: Soft obese nontender nondistended with bowel sounds present. . Musculoskeletal: No lower extremity edema.  24 pulses in all 4 extremities. [name] Psychiatry: Speech is tangential, delusional.  Discharge Instructions You were cared for by a hospitalist during your hospital stay. If you have any questions about your discharge medications or the care you received while you were in the hospital after you are discharged, you can call the unit and asked to speak with the hospitalist on call if the hospitalist that took care of you is not available. Once you are discharged, your primary care physician will handle any further medical issues. Please note that NO REFILLS for any discharge medications will be authorized once you are discharged, as it is imperative that you return to your primary care physician (or establish a relationship with a primary care physician if you do not have one) for your aftercare needs so that they can reassess your need for medications and monitor your lab values.   Allergies as of 09/13/2018   No Known Allergies     Medication List    STOP taking these medications   alum & mag hydroxide-simeth 200-200-20 MG/5ML suspension Commonly known as: MAALOX/MYLANTA   famotidine 20 MG tablet Commonly known as: PEPCID   metroNIDAZOLE 500 MG tablet Commonly known as: FLAGYL   pantoprazole 40 MG tablet Commonly known as: PROTONIX   predniSONE 10 MG tablet Commonly known as: DELTASONE   triamterene-hydrochlorothiazide 37.5-25 MG tablet Commonly known as: MAXZIDE-25     TAKE these medications   carvedilol 3.125 MG tablet Commonly known as: COREG Take 1 tablet  (3.125 mg total) by mouth 2 (two) times daily with a meal. What changed:   medication strength  how much to take   cloNIDine 0.1 MG tablet Commonly known as: CATAPRES Take 1 tablet (0.1 mg total) by mouth 2 (two) times daily.   hydrALAZINE 10 MG tablet Commonly known as: APRESOLINE Take 1 tablet (10 mg total) by mouth every 8 (eight) hours. What changed:   medication strength  how much to take  when to take this  additional instructions   hydrOXYzine 25 MG tablet Commonly known as: ATARAX/VISTARIL Take 1 tablet (25 mg total) by mouth 4 (four) times daily. What changed: when to take this   isosorbide mononitrate 60 MG 24 hr tablet Commonly known as: IMDUR Take 1 tablet (60 mg total) by mouth daily. What changed: how much to take   OLANZapine 5 MG tablet Commonly known as: ZYPREXA Take 1 tablet (5 mg total) by mouth 2 (two) times a day.      No Known Allergies Follow-up Information    Leonie Man, MD. Call in 1 day(s).   Specialty: Cardiology Why: Please call for a post hospital follow-up appointment. Contact information: [place] [address] [phone]  The results of significant diagnostics from this hospitalization (including imaging, microbiology, ancillary and laboratory) are listed below for reference.    Significant Diagnostic Studies: Dg Chest 2 View  Result Date: 09/11/2018 CLINICAL DATA:  Chest pain. EXAM: CHEST - 2 VIEW COMPARISON:  CT scan and radiographs of April 18, 2018. FINDINGS: Stable cardiomegaly. No pneumothorax or pleural effusion is noted. Both lungs are clear. The visualized skeletal structures are unremarkable. IMPRESSION: No active cardiopulmonary disease. Electronically Signed   By: Lupita RaiderJames  Green Jr M.D.   On: 09/11/2018 07:10   Ct Head Wo Contrast  Result Date: 09/11/2018 CLINICAL DATA:  Altered mental status. Schizophrenia. EXAM: CT HEAD WITHOUT CONTRAST TECHNIQUE: Contiguous  axial images were obtained from the base of the skull through the vertex without intravenous contrast. COMPARISON:  04/18/2018 FINDINGS: Brain: No evidence of acute infarction, hemorrhage, hydrocephalus, extra-axial collection or mass lesion/mass effect. Chronic prominent empty sella. Vascular: No hyperdense vessel or unexpected calcification. Skull: Normal. Negative for fracture or focal lesion. Sinuses/Orbits: Normal. Other: None IMPRESSION: 1. No acute intracranial abnormality. 2. Chronic prominent empty sella. Electronically Signed   By: Francene BoyersJames  Maxwell M.D.   On: 09/11/2018 12:00    Microbiology: Recent Results (from the past 240 hour(s))  Wet prep, genital     Status: Abnormal   Collection Time: 09/11/18  6:06 AM  Result Value Ref Range Status   Yeast Wet Prep HPF POC NONE SEEN NONE SEEN Final   Trich, Wet Prep NONE SEEN NONE SEEN Final   Clue Cells Wet Prep HPF POC NONE SEEN NONE SEEN Final   WBC, Wet Prep HPF POC MANY (A) NONE SEEN Final   Sperm NONE SEEN  Final    Comment: Performed at [facility], [address] [place], [place] [address]  SARS Coronavirus 2 (CEPHEID - Performed in [facility] lab), Hosp Order     Status: None   Collection Time: 09/11/18  8:30 AM   Specimen: Nasopharyngeal Swab  Result Value Ref Range Status   SARS Coronavirus 2 NEGATIVE NEGATIVE Final    Comment: (NOTE) If result is NEGATIVE SARS-CoV-2 target nucleic acids are NOT DETECTED. The SARS-CoV-2 RNA is generally detectable in upper and lower  respiratory specimens during the acute phase of infection. The lowest  concentration of SARS-CoV-2 viral copies this assay can detect is 250  copies / mL. A negative result does not preclude SARS-CoV-2 infection  and should not be used as the sole basis for treatment or other  patient management decisions.  A negative result may occur with  improper specimen collection / handling, submission of specimen other  than nasopharyngeal  swab, presence of viral mutation(s) within the  areas targeted by this assay, and inadequate number of viral copies  (<250 copies / mL). A negative result must be combined with clinical  observations, patient history, and epidemiological information. If result is POSITIVE SARS-CoV-2 target nucleic acids are DETECTED. The SARS-CoV-2 RNA is generally detectable in upper and lower  respiratory specimens dur ing the acute phase of infection.  Positive  results are indicative of active infection with SARS-CoV-2.  Clinical  correlation with patient history and other diagnostic information is  necessary to determine patient infection status.  Positive results do  not rule out bacterial infection or co-infection with other viruses. If result is PRESUMPTIVE POSTIVE SARS-CoV-2 nucleic acids MAY BE PRESENT.   A presumptive positive result was obtained on the submitted specimen  and confirmed on repeat testing.  While 2019 novel  coronavirus  (SARS-CoV-2) nucleic acids may be present in the submitted sample  additional confirmatory testing may be necessary for epidemiological  and / or clinical management purposes  to differentiate between  SARS-CoV-2 and other Sarbecovirus currently known to infect humans.  If clinically indicated additional testing with an alternate test  methodology [phone]([id number]) is advised. The SARS-CoV-2 RNA is generally  detectable in upper and lower respiratory sp ecimens during the acute  phase of infection. The expected result is Negative. Fact Sheet for Patients:  BoilerBrush.com.cyhttps://www.fda.gov/media/136312/download Fact Sheet for Healthcare Providers: https://pope.com/https://www.fda.gov/media/136313/download This test is not yet approved or cleared by the [place] FDA and has been authorized for detection and/or diagnosis of SARS-CoV-2 by FDA under an Emergency Use Authorization (EUA).  This EUA will remain in effect (meaning this test can be used) for the duration of the COVID-19 declaration  under Section 564(b)(1) of the Act, 21 U.S.C. section 360bbb-3(b)(1), unless the authorization is terminated or revoked sooner. Performed at [facility], [address] [place], [place] [address]      Labs: Basic Metabolic Panel: Recent Labs  Lab 09/11/18 0627 09/12/18 0456  NA 139 137  K 3.6 3.6  CL 105 105  CO2 24 25  GLUCOSE 127* 115*  BUN 17 22  CREATININE 1.23* 1.42*  CALCIUM 9.4 9.3   Liver Function Tests: Recent Labs  Lab 09/11/18 0627  AST 15  ALT 14  ALKPHOS 72  BILITOT 0.7  PROT 7.6  ALBUMIN 3.9   No results for input(s): LIPASE, AMYLASE in the last 168 hours. No results for input(s): AMMONIA in the last 168 hours. CBC: Recent Labs  Lab 09/11/18 0627 09/12/18 0456  WBC 7.1 7.2  HGB 13.9 12.6  HCT 42.9 38.5  MCV 96.0 95.8  PLT 284 265   Cardiac Enzymes: No results for input(s): CKTOTAL, CKMB, CKMBINDEX, TROPONINI in the last 168 hours. BNP: BNP (last 3 results) Recent Labs    01/09/18 1707  BNP 30.4    ProBNP (last 3 results) No results for input(s): PROBNP in the last 8760 hours.  CBG: No results for input(s): GLUCAP in the last 168 hours.     Signed:  Darlin Droparole N William Schake, MD [facility] 09/13/2018, 8:07 AM

## 2018-09-13 NOTE — TOC Transition Note (Signed)
Transition of Care [facility]) - CM/SW Discharge Note   Patient Details  Name: Erin Riley MRN: [id number] Date of Birth: 1950/05/12  [facility] CM/SW Contact:  Lynnell Catalan, RN Phone Number: 09/13/2018, 9:17 AM   Clinical Narrative:    [place] transport called to pick up pt and transport to [facility]. Marney Doctor RN,BSN [phone]

## 2018-09-13 NOTE — Tx Team (Signed)
Initial Treatment Plan 09/13/2018 2:45 PM [name] TDH:[id number]    PATIENT STRESSORS: Educational concerns Health problems Medication change or noncompliance   PATIENT STRENGTHS: Average or above average intelligence Capable of independent living Communication skills Financial means   PATIENT IDENTIFIED PROBLEMS: Medication noncompliance   Psychosis                    DISCHARGE CRITERIA:  Ability to meet basic life and health needs Improved stabilization in mood, thinking, and/or behavior Motivation to continue treatment in a less acute level of care Safe-care adequate arrangements made  PRELIMINARY DISCHARGE PLAN: Outpatient therapy Return to previous living arrangement  PATIENT/FAMILY INVOLVEMENT: This treatment plan has been presented to and reviewed with the patient, Erin Riley.  The patient and family have been given the opportunity to ask questions and make suggestions.  Kieth Brightly, RN 09/13/2018, 2:45 PM

## 2018-09-13 NOTE — [facility] Suicide Risk Assessment (Signed)
[facility] Admission Suicide Risk Assessment   Nursing information obtained from:  Patient Demographic factors:  NA Current Mental Status:  NA Loss Factors:  NA Historical Factors:  NA Risk Reduction Factors:  Living with another person, especially a relative  Total Time spent with patient: 1 hour Principal Problem: <principal problem not specified> Diagnosis:  Active Problems:   Schizophrenia ([place])  Subjective Data: Patient seen chart reviewed.  68 year old woman with a history of schizophrenia sent here from [facility] because of identified psychotic symptoms.  Patient did not make any suicidal or homicidal statements.  On interview today completely denies suicidality or homicidality.  Insight is poor.  She is not threatening however and appears to have been taking care of herself reasonably well at home.  No known past history of suicide attempts  Continued Clinical Symptoms:  Alcohol Use Disorder Identification Test Final Score (AUDIT): 0 The "Alcohol Use Disorders Identification Test", Guidelines for Use in Primary Care, Second Edition.  World Pharmacologist [facility]). Score between 0-7:  no or low risk or alcohol related problems. Score between 8-15:  moderate risk of alcohol related problems. Score between 16-19:  high risk of alcohol related problems. Score 20 or above:  warrants further diagnostic evaluation for alcohol dependence and treatment.   CLINICAL FACTORS:   Schizophrenia:   Paranoid or undifferentiated type   Musculoskeletal: Strength & Muscle Tone: within normal limits Gait & Station: normal Patient leans: N/A  Psychiatric Specialty Exam: Physical Exam  Nursing note and vitals reviewed. Constitutional: She appears well-developed and well-nourished.  HENT:  Head: Normocephalic and atraumatic.  Eyes: Pupils are equal, round, and reactive to light. Conjunctivae are normal.  Neck: Normal range of motion.  Cardiovascular: Regular rhythm and normal heart sounds.   Respiratory: Effort normal.  GI: Soft.  Musculoskeletal: Normal range of motion.  Neurological: She is alert.  Skin: Skin is warm and dry.  Psychiatric: Her affect is blunt. Her speech is tangential. She is agitated. She is not aggressive. Thought content is paranoid. Cognition and memory are impaired. She expresses impulsivity. She expresses no homicidal and no suicidal ideation.    Review of Systems  Constitutional: Negative.   HENT: Negative.   Eyes: Negative.   Respiratory: Negative.   Cardiovascular: Negative.   Gastrointestinal: Negative.   Musculoskeletal: Negative.   Skin: Negative.   Neurological: Negative.   Psychiatric/Behavioral: Negative for depression, hallucinations, memory loss, substance abuse and suicidal ideas. The patient is not nervous/anxious and does not have insomnia.     Blood pressure (!) 144/85, pulse 90, temperature 97.7 F (36.5 C), temperature source Oral, resp. rate 18, height 5\' 8"  (1.727 m), weight 113.4 kg, last menstrual period 05/10/2013, SpO2 99 %.Body mass index is 38.01 kg/m.  General Appearance: Disheveled  Eye Contact:  Fair  Speech:  Slow  Volume:  Decreased  Mood:  Euthymic  Affect:  Constricted  Thought Process:  Coherent  Orientation:  Full (Time, Place, and Person)  Thought Content:  Illogical and Rumination  Suicidal Thoughts:  No  Homicidal Thoughts:  No  Memory:  Immediate;   Fair Recent;   Poor Remote;   Poor  Judgement:  Impaired  Insight:  Shallow  Psychomotor Activity:  Normal  Concentration:  Concentration: Fair  Recall:  [employer] of Knowledge:  Fair  Language:  Fair  Akathisia:  No  Handed:  Right  AIMS (if indicated):     Assets:  Housing  ADL's:  Intact  Cognition:  Impaired,  Mild  Sleep:  COGNITIVE FEATURES THAT CONTRIBUTE TO RISK:  Thought constriction (tunnel vision)    SUICIDE RISK:   Minimal: No identifiable suicidal ideation.  Patients presenting with no risk factors but with morbid  ruminations; may be classified as minimal risk based on the severity of the depressive symptoms  PLAN OF CARE: Patient admitted.  15-minute checks in place.  Medication including Zyprexa prescribed.  Patient will be seen by treatment team if possible and we will try to contact her family and start arranging for appropriate disposition.  Dangerousness will be reevaluated prior to discharge.  I certify that inpatient services furnished can reasonably be expected to improve the patient's condition.   Mordecai RasmussenJohn Cathlin Buchan, MD 09/13/2018, 5:54 PM

## 2018-09-14 DIAGNOSIS — F200 Paranoid schizophrenia: Secondary | ICD-10-CM

## 2018-09-14 MED ORDER — OLANZAPINE 10 MG PO TABS
10.0000 mg | ORAL_TABLET | Freq: Every day | ORAL | Status: DC
  Administered 2018-09-14 – 2018-09-16 (×3): 10 mg via ORAL
  Filled 2018-09-14 (×3): qty 1

## 2018-09-14 MED ORDER — CARVEDILOL 12.5 MG PO TABS
50.0000 mg | ORAL_TABLET | Freq: Two times a day (BID) | ORAL | Status: DC
  Administered 2018-09-14 – 2018-09-17 (×6): 50 mg via ORAL
  Filled 2018-09-14 (×6): qty 4

## 2018-09-14 MED ORDER — OLANZAPINE 5 MG PO TABS
5.0000 mg | ORAL_TABLET | Freq: Every day | ORAL | Status: DC
  Administered 2018-09-15 – 2018-09-17 (×3): 5 mg via ORAL
  Filled 2018-09-14 (×3): qty 1

## 2018-09-14 NOTE — Plan of Care (Signed)
Patient is calm and adjusting well in the unit socializing appropriately in the unit with peers, complying with medications and therapy regimen, contract for safety , denies any SI.HI,AVH education and support is provided no distress.   Problem: Education: Goal: Knowledge of [facility] Education information/materials will improve Outcome: Progressing Goal: Emotional status will improve Outcome: Progressing Goal: Mental status will improve Outcome: Progressing Goal: Verbalization of understanding the information provided will improve Outcome: Progressing   Problem: Coping: Goal: Ability to verbalize frustrations and anger appropriately will improve Outcome: Progressing Goal: Ability to demonstrate self-control will improve Outcome: Progressing   Problem: Health Behavior/Discharge Planning: Goal: Compliance with treatment plan for underlying cause of condition will improve Outcome: Progressing   Problem: Activity: Goal: Will verbalize the importance of balancing activity with adequate rest periods Outcome: Progressing   Problem: Education: Goal: Will be free of psychotic symptoms Outcome: Progressing Goal: Knowledge of the prescribed therapeutic regimen will improve Outcome: Progressing   Problem: Coping: Goal: Coping ability will improve Outcome: Progressing   Problem: Health Behavior/Discharge Planning: Goal: Compliance with prescribed medication regimen will improve Outcome: Progressing

## 2018-09-14 NOTE — Plan of Care (Signed)
Pt denies depression, anxiety, SI, HI and AVH. Pt was educated on care plan and verbalizes understanding. Collier Bullock RN Problem: Education: Goal: Knowledge of [facility] Education information/materials will improve Outcome: Progressing Goal: Emotional status will improve Outcome: Progressing Goal: Mental status will improve Outcome: Progressing Goal: Verbalization of understanding the information provided will improve Outcome: Progressing   Problem: Coping: Goal: Ability to verbalize frustrations and anger appropriately will improve Outcome: Progressing Goal: Ability to demonstrate self-control will improve Outcome: Progressing   Problem: Health Behavior/Discharge Planning: Goal: Compliance with treatment plan for underlying cause of condition will improve Outcome: Progressing   Problem: Activity: Goal: Will verbalize the importance of balancing activity with adequate rest periods Outcome: Progressing   Problem: Education: Goal: Will be free of psychotic symptoms Outcome: Progressing Goal: Knowledge of the prescribed therapeutic regimen will improve Outcome: Progressing   Problem: Coping: Goal: Coping ability will improve Outcome: Progressing   Problem: Health Behavior/Discharge Planning: Goal: Compliance with prescribed medication regimen will improve Outcome: Progressing

## 2018-09-14 NOTE — Progress Notes (Signed)
D: Patient has been calm and cooperative. Forwards very little in conversation. Minimal to no interaction with peers or staff. Affect is flat. Appears internally preoccupied. Denies SI and HI. Has been in dayroom watching TV with peers. Denies AVH. A: Continue to monitor for safety. R: Safety maintained.

## 2018-09-14 NOTE — [facility] Group Notes (Signed)
LCSW Group Therapy Note   09/14/2018 1:15pm   Type of Therapy and Topic:  Group Therapy:  Trust and Honesty  Participation Level:  Did Not Attend  Description of Group:    In this group patients will be asked to explore the value of being honest.  Patients will be guided to discuss their thoughts, feelings, and behaviors related to honesty and trusting in others. Patients will process together how trust and honesty relate to forming relationships with peers, family members, and self. Each patient will be challenged to identify and express feelings of being vulnerable. Patients will discuss reasons why people are dishonest and identify alternative outcomes if one was truthful (to self or others). This group will be process-oriented, with patients participating in exploration of their own experiences, giving and receiving support, and processing challenge from other group members.   Therapeutic Goals: 1. Patient will identify why honesty is important to relationships and how honesty overall affects relationships.  2. Patient will identify a situation where they lied or were lied too and the  feelings, thought process, and behaviors surrounding the situation 3. Patient will identify the meaning of being vulnerable, how that feels, and how that correlates to being honest with self and others. 4. Patient will identify situations where they could have told the truth, but instead lied and explain reasons of dishonesty.   Summary of Patient Progress Pt was invited to attend group but chose not to attend. CSW will continue to encourage pt to attend group throughout their admission.     Therapeutic Modalities:   Cognitive Behavioral Therapy Solution Focused Therapy Motivational Interviewing Brief Therapy  Kenzlei Runions  CUEBAS-COLON, LCSW 09/14/2018 12:48 PM  

## 2018-09-14 NOTE — Plan of Care (Signed)
  Problem: Education: Goal: Knowledge of [place] [facility] Education information/materials will improve Outcome: Progressing Goal: Emotional status will improve Outcome: Progressing Goal: Mental status will improve Outcome: Progressing Goal: Verbalization of understanding the information provided will improve Outcome: Progressing  D: Patient has been calm and cooperative. Forwards very little in conversation. Minimal to no interaction with peers or staff. Affect is flat. Appears internally preoccupied. Denies SI and HI. Has been in dayroom watching TV with peers. Denies AVH. A: Continue to monitor for safety. R: Safety maintained.

## 2018-09-14 NOTE — Progress Notes (Signed)
Pt has been calm and cooperative today. She has been somewhat social in the day room and then resting in her room. Collier Bullock RN

## 2018-09-14 NOTE — Progress Notes (Signed)
[facility]  09/14/2018 8:44 AM Erin Riley  MRN:  [id number] Subjective:   Patient seen she is still very guarded in providing minimal information unable to articulate why she is hospitalized but she expresses no delusional believes she is calm and redirectable she is alert and oriented to person place general situation but not date and knows the day and month.  Her affect is constricted again she is providing all negative answers on review of systems other than acknowledging hypertension and providing all negative answers with regards to screening for positive symptoms. Principal Problem: Schizophrenia (HCC) Diagnosis: Principal Problem:   Schizophrenia (HCC) Active Problems:   Essential hypertension   Obesity (BMI 30-39.9)   Hypertensive hypertrophic cardiomyopathy: NYHA class II:    Total Time spent with patient: 20 minutes  Past Psychiatric History: schizophrenia  Past Medical History:  Past Medical History:  Diagnosis Date  . Chronic back pain   . Chronic kidney disease (CKD), stage II (mild)    Class I-II  . Coronary artery disease 04/2009   50% stenosis in the perforator of LAD; catheterization was for an abnormal Myoview in January 2000 showing anterior and inferolateral ischemia.  . Diverticulitis   . 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 > 3 subsequent Echos show improved/normal EF with moderate to severe concentric LVH and diastolic dysfunction with LVOT/intracavitary gradient --> 06/2016: Severe LVH.  Vigorous EF, 65-70%.?? Gr 1 DD. Mild AS.  [name]. Hyperlipidemia   . Hypertension   . Hypertensive hypertrophic cardiomyopathy: NYHA class II:  Echo: Severe concentric LVH with LV OT gradient; essentially preserved EF with diastolic dysfunction 02/15/2013   Echo 06/2016: Severe Concentric LVH. Vigorous EF 65-70%. ~ Gr I DD.   . Mild aortic stenosis by prior echocardiography    Echo 06/2016: Mild AS (Mean Gradient 15 mmHg); has had prior Mod-Severe MR (not seen on current echo)  . PAD (peripheral artery disease) [facility](HCC) March 2013   Lower extremity Dopplers: R. SFA 50-60%, R. PTA proximally occluded with distal reconstitution;; L. common iliac ~50%, L. SFA 50-70% stenosis, L. PTA < 50%  . Schizophrenia [facility](HCC)     Past Surgical History:  Procedure Laterality Date  . BUNIONECTOMY    . carotid doppler  05/29/2011   left bulb/prox ICA moderate amtfibrous plaque with no evidence significant reduction.,right bulb /proximal ICA normal patency  . lower extremity doppler  05/29/2011   right SFA 50% to 59% diameter reduction,right posterior tibal atreery occlusive disease,reconstituting distally, left common illiac<50%,left SFA 50 to70%,left post. tibial <50%  . NM MYOCAR PERF WALL MOTION  03/2009   Persantine; EF 51%-both anterior and inferolateral ischemia  . TRANSTHORACIC ECHOCARDIOGRAM  06/2016   Severe LVH.  Vigorous EF of 65-70%.  No RWMA. ~Only grade 1 diastolic dysfunction.  Mild aortic stenosis (mean gradient 15 mmHg)  . TRANSTHORACIC ECHOCARDIOGRAM  07/2012   EF 50-55%; severe concentric LVH; only grade 1 diastolic dysfunction. Mild aortic sclerosis - with LVOT /intracavitary gradient of roughly 20 mmHg mean. Mild to moderately dilated LA;; previously reported MR not seen   Family History:  Family History  Problem Relation Age of Onset  . Hypertension Mother   . Breast cancer Neg Hx    Family Psychiatric  History: ukn to pt Social History:  Social History   Substance and Sexual Activity  Alcohol Use No     Social History   Substance and Sexual  Activity  Drug Use No    Social History   Socioeconomic History  . Marital status: Single    Spouse name: Not on file  . Number of children: 7  . Years of education: Not on file  . Highest education level: Not on file  Occupational History  . Not on file  Social Needs  . Financial resource strain: Not on  file  . Food insecurity    Worry: Not on file    Inability: Not on file  . Transportation needs    Medical: Not on file    Non-medical: Not on file  Tobacco Use  . Smoking status: Former Smoker    Types: Cigarettes    Quit date: 05/14/2002    Years since quitting: 16.3  . Smokeless tobacco: Never Used  Substance and Sexual Activity  . Alcohol use: No  . Drug use: No  . Sexual activity: Yes    Birth control/protection: Post-menopausal  Lifestyle  . Physical activity    Days per week: Not on file    Minutes per session: Not on file  . Stress: Not on file  Relationships  . Social Herbalist on phone: Not on file    Gets together: Not on file    Attends religious service: Not on file    Active member of club or organization: Not on file    Attends meetings of clubs or organizations: Not on file    Relationship status: Not on file  Other Topics Concern  . Not on file  Social History Narrative   Now single mother of 2 with one grandchild. She quit smoking roughly 5 years ago and is not so since. She has also stopped drinking alcohol. She does try get routine exercise walking at least a mile 3-4 days a week.    She lives with her 69 year old mother. 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   Additional Social History:                         Sleep: Fair  Appetite:  Fair  Current Medications: Current Facility-Administered Medications  Medication Dose Route Frequency Provider Last Rate Last Dose  . acetaminophen (TYLENOL) tablet 650 mg  650 mg Oral Q6H PRN Money, Lowry Ram, FNP   650 mg at 09/13/18 1500  . alum & mag hydroxide-simeth (MAALOX/MYLANTA) 200-200-20 MG/5ML suspension 30 mL  30 mL Oral Q4H PRN Money, Darnelle Maffucci B, FNP      . carvedilol (COREG) tablet 50 mg  50 mg Oral BID WC Johnn Hai, MD      . famotidine (PEPCID) tablet 20 mg  20 mg Oral QHS Money, Travis B, FNP   20 mg at 09/13/18 2210  . hydrALAZINE (APRESOLINE) tablet 100 mg  100 mg Oral Q8H Money,  Travis B, FNP   100 mg at 09/14/18 4166  . hydrOXYzine (ATARAX/VISTARIL) tablet 25 mg  25 mg Oral TID PRN Money, Lowry Ram, FNP      . isosorbide mononitrate (IMDUR) 24 hr tablet 90 mg  90 mg Oral Daily Money, Lowry Ram, FNP   90 mg at 09/14/18 0816  . magnesium hydroxide (MILK OF MAGNESIA) suspension 30 mL  30 mL Oral Daily PRN Money, Lowry Ram, FNP      . OLANZapine (ZYPREXA) tablet 5 mg  5 mg Oral BID Money, Lowry Ram, FNP   5 mg at 09/14/18 0816  . ondansetron (ZOFRAN) tablet 4 mg  4 mg  Oral Q6H PRN Money, Gerlene Burdockravis B, FNP      . pantoprazole (PROTONIX) EC tablet 40 mg  40 mg Oral Daily Clapacs, Jackquline DenmarkJohn T, MD   40 mg at 09/14/18 0817  . senna-docusate (Senokot-S) tablet 1 tablet  1 tablet Oral QHS PRN Money, Gerlene Burdockravis B, FNP        Lab Results: No results found for this or any previous visit (from the past 48 hour(s)).  Blood Alcohol level:  Lab Results  Component Value Date   ETH <10 04/18/2018   ETH <10 10/04/2017    Metabolic Disorder Labs: Lab Results  Component Value Date   HGBA1C 5.9 (H) 06/09/2016   MPG 123 06/09/2016   MPG 105 07/31/2012   Lab Results  Component Value Date   PROLACTIN 16.5 06/09/2016   Lab Results  Component Value Date   CHOL 123 08/31/2017   TRIG 120 08/31/2017   HDL 40 08/31/2017   CHOLHDL 3.1 08/31/2017   VLDL 55 (H) 06/09/2016   LDLCALC 59 08/31/2017   LDLCALC 80 06/09/2016    Physical Findings: AIMS:  , ,  ,  ,    CIWA:    COWS:     Musculoskeletal: Strength & Muscle Tone: within normal limits Gait & Station: normal Patient leans: N/A  Psychiatric Specialty Exam: Physical Exam  ROS  Blood pressure (!) 159/86, pulse 95, temperature 98.6 F (37 C), temperature source Oral, resp. rate 16, height 5\' 8"  (1.727 m), weight 113.4 kg, last menstrual period 05/10/2013, SpO2 95 %.Body mass index is 38.01 kg/m.  General Appearance: Casual  Eye Contact:  Minimal  Speech:  Slow  Volume:  Decreased  Mood:  Dysphoric  Affect:  Flat  Thought Process:   Irrelevant and Descriptions of Associations: Circumstantial  Orientation:  Other:  Person place day month in general situation  Thought Content:  Poverty of content but denies all positive symptoms and no delusional believes expressed on this exam  Suicidal Thoughts:  No  Homicidal Thoughts:  No  Memory:  Immediate;   Poor  Judgement:  Impaired  Insight:  Shallow  Psychomotor Activity:  Normal  Concentration:  Concentration: Poor  Recall:  Poor  Fund of Knowledge:  Poor  Language:  Poor  Akathisia:  Negative  Handed:  Right  AIMS (if indicated):     Assets:  Resilience  ADL's:  Intact  Cognition:  WNL  Sleep:  Number of Hours: 5.75     Treatment Plan Summary: Daily contact with patient to assess and evaluate symptoms and progress in treatment and Medication management escalate beta-blocker as she has been hypertensive since admission and she is also concerned about her hypertension this is the one concern she had, escalate olanzapine at bedtime continue reality-based therapies  Malvin JohnsFARAH,Sharlyn Odonnel, MD 09/14/2018, 8:44 AM

## 2018-09-15 NOTE — Progress Notes (Signed)
[facility]  09/15/2018 8:10 AM Erin Riley  MRN:  [id number] Subjective:   Patient provides no new information continues to deny auditory or visual hallucinations and deny thoughts of harming self, some poverty of content reflected in her speech.  Hypertension persists despite therapies will address, again she is on antihypertensives already.  No involuntary movements.  Cordial and compliant but again minimally engaged  Principal Problem: Schizophrenia (HCC) Diagnosis: Principal Problem:   Schizophrenia (HCC) Active Problems:   Essential hypertension   Obesity (BMI 30-39.9)   Hypertensive hypertrophic cardiomyopathy: NYHA class II:    Total Time spent with patient: 20 minutes  Past Psychiatric History: see eval  Past Medical History:  Past Medical History:  Diagnosis Date  . Chronic back pain   . Chronic kidney disease (CKD), stage II (mild)    Class I-II  . Coronary artery disease 04/2009   50% stenosis in the perforator of LAD; catheterization was for an abnormal Myoview in January 2000 showing anterior and inferolateral ischemia.  . Diverticulitis   . 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 > 3 subsequent Echos show improved/normal EF with moderate to severe concentric LVH and diastolic dysfunction with LVOT/intracavitary gradient --> 06/2016: Severe LVH.  Vigorous EF, 65-70%.?? Gr 1 DD. Mild AS.  [name]. Hyperlipidemia   . Hypertension   . Hypertensive hypertrophic cardiomyopathy: NYHA class II:  Echo: Severe concentric LVH with LV OT gradient; essentially preserved EF with diastolic dysfunction 02/15/2013   Echo 06/2016: Severe Concentric LVH. Vigorous EF 65-70%. ~ Gr I DD.   . Mild aortic stenosis by prior echocardiography    Echo 06/2016: Mild AS (Mean Gradient 15 mmHg); has had prior Mod-Severe MR (not seen on current echo)  . PAD (peripheral artery disease) [facility](HCC) March 2013   Lower extremity Dopplers: R. SFA 50-60%, R. PTA proximally occluded with distal reconstitution;; L. common iliac ~50%, L. SFA 50-70% stenosis, L. PTA < 50%  . Schizophrenia [facility](HCC)     Past Surgical History:  Procedure Laterality Date  . BUNIONECTOMY    . carotid doppler  05/29/2011   left bulb/prox ICA moderate amtfibrous plaque with no evidence significant reduction.,right bulb /proximal ICA normal patency  . lower extremity doppler  05/29/2011   right SFA 50% to 59% diameter reduction,right posterior tibal atreery occlusive disease,reconstituting distally, left common illiac<50%,left SFA 50 to70%,left post. tibial <50%  . NM MYOCAR PERF WALL MOTION  03/2009   Persantine; EF 51%-both anterior and inferolateral ischemia  . TRANSTHORACIC ECHOCARDIOGRAM  06/2016   Severe LVH.  Vigorous EF of 65-70%.  No RWMA. ~Only grade 1 diastolic dysfunction.  Mild aortic stenosis (mean gradient 15 mmHg)  . TRANSTHORACIC ECHOCARDIOGRAM  07/2012   EF 50-55%; severe concentric LVH; only grade 1 diastolic dysfunction. Mild aortic sclerosis - with LVOT /intracavitary gradient of roughly 20 mmHg mean. Mild to moderately dilated LA;; previously reported MR not seen   Family History:  Family History  Problem Relation Age of Onset  . Hypertension Mother   . Breast cancer Neg Hx    Family Psychiatric  History: shares no new data Social History:  Social History   Substance and Sexual Activity  Alcohol Use No     Social History   Substance and Sexual Activity  Drug Use No    Social History   Socioeconomic History  . Marital status: Single    Spouse name: Not on  file  . Number of children: 7  . Years of education: Not on file  . Highest education level: Not on file  Occupational History  . Not on file  Social Needs  . Financial resource strain: Not on file  . Food insecurity    Worry: Not on file    Inability: Not on file  . Transportation needs    Medical: Not on file    Non-medical: Not on  file  Tobacco Use  . Smoking status: Former Smoker    Types: Cigarettes    Quit date: 05/14/2002    Years since quitting: 16.3  . Smokeless tobacco: Never Used  Substance and Sexual Activity  . Alcohol use: No  . Drug use: No  . Sexual activity: Yes    Birth control/protection: Post-menopausal  Lifestyle  . Physical activity    Days per week: Not on file    Minutes per session: Not on file  . Stress: Not on file  Relationships  . Social Herbalist on phone: Not on file    Gets together: Not on file    Attends religious service: Not on file    Active member of club or organization: Not on file    Attends meetings of clubs or organizations: Not on file    Relationship status: Not on file  Other Topics Concern  . Not on file  Social History Narrative   Now single mother of 2 with one grandchild. She quit smoking roughly 5 years ago and is not so since. She has also stopped drinking alcohol. She does try get routine exercise walking at least a mile 3-4 days a week.    She lives with her 62 year old mother. 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   Additional Social History:                         Sleep: Good  Appetite:  Good  Current Medications: Current Facility-Administered Medications  Medication Dose Route Frequency Provider Last Rate Last Dose  . acetaminophen (TYLENOL) tablet 650 mg  650 mg Oral Q6H PRN Money, Lowry Ram, FNP   650 mg at 09/13/18 1500  . alum & mag hydroxide-simeth (MAALOX/MYLANTA) 200-200-20 MG/5ML suspension 30 mL  30 mL Oral Q4H PRN Money, Darnelle Maffucci B, FNP      . carvedilol (COREG) tablet 50 mg  50 mg Oral BID WC Johnn Hai, MD   50 mg at 09/15/18 0804  . famotidine (PEPCID) tablet 20 mg  20 mg Oral QHS Money, Travis B, FNP   20 mg at 09/14/18 2129  . hydrALAZINE (APRESOLINE) tablet 100 mg  100 mg Oral Q8H Money, Travis B, FNP   100 mg at 09/15/18 0640  . hydrOXYzine (ATARAX/VISTARIL) tablet 25 mg  25 mg Oral TID PRN Money, Lowry Ram, FNP       . isosorbide mononitrate (IMDUR) 24 hr tablet 90 mg  90 mg Oral Daily Money, Lowry Ram, FNP   90 mg at 09/15/18 0803  . magnesium hydroxide (MILK OF MAGNESIA) suspension 30 mL  30 mL Oral Daily PRN Money, Lowry Ram, FNP      . OLANZapine (ZYPREXA) tablet 10 mg  10 mg Oral QHS Johnn Hai, MD   10 mg at 09/14/18 2129  . OLANZapine (ZYPREXA) tablet 5 mg  5 mg Oral Daily Johnn Hai, MD   5 mg at 09/15/18 0803  . ondansetron (ZOFRAN) tablet 4 mg  4 mg Oral Q6H  PRN Money, Gerlene Burdockravis B, FNP      . pantoprazole (PROTONIX) EC tablet 40 mg  40 mg Oral Daily Clapacs, Jackquline DenmarkJohn T, MD   40 mg at 09/15/18 0803  . senna-docusate (Senokot-S) tablet 1 tablet  1 tablet Oral QHS PRN Money, Gerlene Burdockravis B, FNP        Lab Results: No results found for this or any previous visit (from the past 48 hour(s)).  Blood Alcohol level:  Lab Results  Component Value Date   ETH <10 04/18/2018   ETH <10 10/04/2017    Metabolic Disorder Labs: Lab Results  Component Value Date   HGBA1C 5.9 (H) 06/09/2016   MPG 123 06/09/2016   MPG 105 07/31/2012   Lab Results  Component Value Date   PROLACTIN 16.5 06/09/2016   Lab Results  Component Value Date   CHOL 123 08/31/2017   TRIG 120 08/31/2017   HDL 40 08/31/2017   CHOLHDL 3.1 08/31/2017   VLDL 55 (H) 06/09/2016   LDLCALC 59 08/31/2017   LDLCALC 80 06/09/2016    Physical Findings: AIMS:  , ,  ,  ,    CIWA:    COWS:     Musculoskeletal: Strength & Muscle Tone: within normal limits Gait & Station: normal Patient leans: N/A  Psychiatric Specialty Exam: Physical Exam  ROS  Blood pressure (!) 169/89, pulse 81, temperature 98 F (36.7 C), temperature source Oral, resp. rate 18, height 5\' 8"  (1.727 m), weight 113.4 kg, last menstrual period 05/10/2013, SpO2 100 %.Body mass index is 38.01 kg/m.  General Appearance: Casual  Eye Contact:  Good  Speech:  Clear and Coherent  Volume:  Decreased  Mood:  Dysphoric  Affect:  Constricted  Thought Process:  Goal Directed  and Descriptions of Associations: Circumstantial  Orientation:  Full (Time, Place, and Person)  Thought Content:  poverty of content  Suicidal Thoughts:  No  Homicidal Thoughts:  No  Memory:  Immediate;   Fair  Judgement:  Fair  Insight:  Fair  Psychomotor Activity:  Normal  Concentration:  Concentration: Fair and Attention Span: Fair  Recall:  Good  Fund of Knowledge:  Fair  Language:  Fair  Akathisia:  Negative  Handed:  Right  AIMS (if indicated):     Assets:  Leisure Time Physical Health Resilience  ADL's:  Intact  Cognition:  WNL  Sleep:  Number of Hours: 7.25     Treatment Plan Summary: Daily contact with patient to assess and evaluate symptoms and progress in treatment, Medication management and Plan No change in antipsychotics precautions or therapy emphasis will address hypertension further  Malvin JohnsFARAH,Emilene Roma, MD 09/15/2018, 8:10 AM

## 2018-09-15 NOTE — Plan of Care (Signed)
D- Patient alert and oriented. Patient presents in a pleasant mood on assessment stating that she slept good last night and had no complaints or concerns to voice to this Probation officer. Patient denies SI, HI, AVH, and pain at this time. Patient also denies any signs/symptoms of depression and anxiety stating that overall she feels "good". Patient had no stated goals for today.   A- Scheduled medications administered to patient, per MD orders. Support and encouragement provided.  Routine safety checks conducted every 15 minutes.  Patient informed to notify staff with problems or concerns.  R- No adverse drug reactions noted. Patient contracts for safety at this time. Patient compliant with medications and treatment plan. Patient receptive, calm, and cooperative. Patient interacts well with others on the unit.  Patient remains safe at this time.  Problem: Education: Goal: Knowledge of [place] [facility] Education information/materials will improve Outcome: Progressing Goal: Emotional status will improve Outcome: Progressing Goal: Mental status will improve Outcome: Progressing Goal: Verbalization of understanding the information provided will improve Outcome: Progressing   Problem: Coping: Goal: Ability to verbalize frustrations and anger appropriately will improve Outcome: Progressing Goal: Ability to demonstrate self-control will improve Outcome: Progressing   Problem: Health Behavior/Discharge Planning: Goal: Compliance with treatment plan for underlying cause of condition will improve Outcome: Progressing   Problem: Activity: Goal: Will verbalize the importance of balancing activity with adequate rest periods Outcome: Progressing   Problem: Education: Goal: Will be free of psychotic symptoms Outcome: Progressing Goal: Knowledge of the prescribed therapeutic regimen will improve Outcome: Progressing   Problem: Coping: Goal: Coping ability will improve Outcome: Progressing    Problem: Health Behavior/Discharge Planning: Goal: Compliance with prescribed medication regimen will improve Outcome: Progressing

## 2018-09-15 NOTE — B[facility] Counselor (Signed)
Weekend CSW attempted 3 times to complete PSA. Patient refused to speak with CSW and stated she was not going to answer any questions or sign any documents because she is not "crazy." CSW explained purpose of PSA, patient declined.    Cheree Ditto, LCSW  09/15/2018

## 2018-09-15 NOTE — [facility] Group Notes (Signed)
LCSW Group Therapy Note 09/15/2018 1:15pm  Type of Therapy and Topic: Group Therapy: Feelings Around Returning Home & Establishing a Supportive Framework and Supporting Oneself When Supports Not Available  Participation Level: Did Not Attend  Description of Group:  Patients first processed thoughts and feelings about upcoming discharge. These included fears of upcoming changes, lack of change, new living environments, judgements and expectations from others and overall stigma of mental health issues. The group then discussed the definition of a supportive framework, what that looks and feels like, and how do to discern it from an unhealthy non-supportive network. The group identified different types of supports as well as what to do when your family/friends are less than helpful or unavailable  Therapeutic Goals  1. Patient will identify one healthy supportive network that they can use at discharge. 2. Patient will identify one factor of a supportive framework and how to tell it from an unhealthy network. 3. Patient able to identify one coping skill to use when they do not have positive supports from others. 4. Patient will demonstrate ability to communicate their needs through discussion and/or role plays.  Summary of Patient Progress:  Pt was invited to attend group but chose not to attend. CSW will continue to encourage pt to attend group throughout their admission.   Therapeutic Modalities Cognitive Behavioral Therapy Motivational Interviewing   Erin Riley  CUEBAS-COLON, LCSW 09/15/2018 3:41 PM  

## 2018-09-16 NOTE — Progress Notes (Signed)
D: Patient has been isolative to self. Affect flat. Went outside with patients. Denies SI, HI and AVH. A: Continue to monitor for safety. R: Safety maintained

## 2018-09-16 NOTE — Progress Notes (Signed)
Recreation Therapy Notes   Date: 09/16/2018  Time: 9:30 am   Location: Craft room   Behavioral response: N/A   Intervention Topic: Self-care  Discussion/Intervention: Patient did not attend group.   Clinical Observations/Feedback:  Patient did not attend group.   Alexei Ey LRT/CTRS          Kayceon Oki 09/16/2018 10:38 AM

## 2018-09-16 NOTE — BHH Group Notes (Signed)
LCSW Group Therapy Note   09/16/2018 1:00 PM  Type of Therapy and Topic:  Group Therapy:  Overcoming Obstacles   Participation Level:  Did Not Attend   Description of Group:    In this group patients will be encouraged to explore what they see as obstacles to their own wellness and recovery. They will be guided to discuss their thoughts, feelings, and behaviors related to these obstacles. The group will process together ways to cope with barriers, with attention given to specific choices patients can make. Each patient will be challenged to identify changes they are motivated to make in order to overcome their obstacles. This group will be process-oriented, with patients participating in exploration of their own experiences as well as giving and receiving support and challenge from other group members.   Therapeutic Goals: 1. Patient will identify personal and current obstacles as they relate to admission. 2. Patient will identify barriers that currently interfere with their wellness or overcoming obstacles.  3. Patient will identify feelings, thought process and behaviors related to these barriers. 4. Patient will identify two changes they are willing to make to overcome these obstacles:      Summary of Patient Progress X   Therapeutic Modalities:   Cognitive Behavioral Therapy Solution Focused Therapy Motivational Interviewing Relapse Prevention Therapy  Assunta Curtis, MSW, LCSW 09/16/2018 11:14 AM
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
## 2018-09-16 NOTE — Plan of Care (Signed)
Pt denies depression, anxiety, SI, HI an AVH. Pt was educated on care plan and verbalizes understanding. Collier Bullock RN Problem: Education: Goal: Knowledge of  General Education information/materials will improve Outcome: Progressing Goal: Emotional status will improve Outcome: Progressing Goal: Mental status will improve Outcome: Progressing Goal: Verbalization of understanding the information provided will improve Outcome: Progressing   Problem: Coping: Goal: Ability to verbalize frustrations and anger appropriately will improve Outcome: Progressing Goal: Ability to demonstrate self-control will improve Outcome: Progressing   Problem: Health Behavior/Discharge Planning: Goal: Compliance with treatment plan for underlying cause of condition will improve Outcome: Progressing   Problem: Activity: Goal: Will verbalize the importance of balancing activity with adequate rest periods Outcome: Progressing   Problem: Education: Goal: Will be free of psychotic symptoms Outcome: Progressing Goal: Knowledge of the prescribed therapeutic regimen will improve Outcome: Progressing   Problem: Coping: Goal: Coping ability will improve Outcome: Progressing   Problem: Health Behavior/Discharge Planning: Goal: Compliance with prescribed medication regimen will improve Outcome: Progressing

## 2018-09-16 NOTE — BHH Counselor (Signed)
CSW met with the patient to complete PSA with the patient.  CSW checked in on aftercare plans and patient declined.  Assunta Curtis, MSW, LCSW 09/16/2018 9:53 AM

## 2018-09-16 NOTE — Progress Notes (Signed)
Patient has been calm and cooperative, states that she feels weak and tired today , her blood pressure is under control and stable see flow chart.  Denied si/hi/avh , responding well to treatment and sleeping adequately with out PRN sleep aid , patient has no complains at this time, no distress.

## 2018-09-16 NOTE — BHH Suicide Risk Assessment (Signed)
[place] INPATIENT:  Family/Significant Other Suicide Prevention Education  Suicide Prevention Education:  Patient Refusal for Family/Significant Other Suicide Prevention Education: The patient Erin Riley has refused to provide written consent for family/significant other to be provided Family/Significant Other Suicide Prevention Education during admission and/or prior to discharge.  Physician notified.  SPE completed with pt, as pt refused to consent to family contact. SPI pamphlet provided to pt and pt was encouraged to share information with support network, ask questions, and talk about any concerns relating to SPE. Pt denies access to guns/firearms and verbalized understanding of information provided. Mobile Crisis information also provided to pt.   Rozann Lesches 09/16/2018, 10:47 AM

## 2018-09-16 NOTE — Progress Notes (Signed)
Recreation Therapy Notes  INPATIENT RECREATION THERAPY ASSESSMENT  Patient Details Name: Erin Riley MRN: [id number] DOB: 1950/12/21 Today's Date: 09/16/2018       Information Obtained From: Patient  Able to Participate in Assessment/Interview: Yes  Patient Presentation: Responsive  Reason for Admission (Per Patient): Active Symptoms  Patient Stressors:    Coping Skills:   Exercise  Leisure Interests (2+):  Exercise - Walking, Games - Cards  Frequency of Recreation/Participation: Monthly  Awareness of Community Resources:     [employer]:     Current Use:    If no, Barriers?:    Expressed Interest in [employer] Information:    [employer] of Residence:  [place]  Patient Main Form of Transportation: Musician  Patient Strengths:  Im just me  Patient Identified Areas of Improvement:  Get self established  Patient Goal for Hospitalization:  Get blood pressure right  Current SI (including self-harm):  No  Current HI:  No  Current AVH: No  Staff Intervention Plan: Group Attendance, Collaborate with Interdisciplinary Treatment Team  Consent to Intern Participation: N/A  Erin Riley 09/16/2018, 2:16 PM

## 2018-09-16 NOTE — Tx Team (Addendum)
Interdisciplinary Treatment and Diagnostic Plan Update  09/16/2018 Time of Session: 230p Erin Riley MRN: [id number]  Principal Diagnosis: Schizophrenia [facility])  Secondary Diagnoses: Principal Problem:   Schizophrenia ([place]) Active Problems:   Essential hypertension   Obesity (BMI 30-39.9)   Hypertensive hypertrophic cardiomyopathy: NYHA class II:     Current Medications:  Current Facility-Administered Medications  Medication Dose Route Frequency Provider Last Rate Last Dose  . acetaminophen (TYLENOL) tablet 650 mg  650 mg Oral Q6H PRN Money, Lowry Ram, FNP   650 mg at 09/13/18 1500  . alum & mag hydroxide-simeth (MAALOX/MYLANTA) 200-200-20 MG/5ML suspension 30 mL  30 mL Oral Q4H PRN Money, Darnelle Maffucci B, FNP      . carvedilol (COREG) tablet 50 mg  50 mg Oral BID WC Johnn Hai, MD   50 mg at 09/16/18 0727  . famotidine (PEPCID) tablet 20 mg  20 mg Oral QHS Money, Lowry Ram, FNP   20 mg at 09/15/18 2142  . hydrALAZINE (APRESOLINE) tablet 100 mg  100 mg Oral Q8H Money, Travis B, FNP   100 mg at 09/16/18 1353  . hydrOXYzine (ATARAX/VISTARIL) tablet 25 mg  25 mg Oral TID PRN Money, Lowry Ram, FNP      . isosorbide mononitrate (IMDUR) 24 hr tablet 90 mg  90 mg Oral Daily Money, Lowry Ram, FNP   90 mg at 09/16/18 0731  . magnesium hydroxide (MILK OF MAGNESIA) suspension 30 mL  30 mL Oral Daily PRN Money, Lowry Ram, FNP      . OLANZapine (ZYPREXA) tablet 10 mg  10 mg Oral QHS Johnn Hai, MD   10 mg at 09/15/18 2141  . OLANZapine (ZYPREXA) tablet 5 mg  5 mg Oral Daily Johnn Hai, MD   5 mg at 09/16/18 0730  . ondansetron (ZOFRAN) tablet 4 mg  4 mg Oral Q6H PRN Money, Lowry Ram, FNP      . pantoprazole (PROTONIX) EC tablet 40 mg  40 mg Oral Daily Clapacs, Madie Reno, MD   40 mg at 09/16/18 0732  . senna-docusate (Senokot-S) tablet 1 tablet  1 tablet Oral QHS PRN Money, Lowry Ram, FNP       PTA Medications: Medications Prior to Admission  Medication Sig Dispense Refill Last Dose  . carvedilol (COREG)  3.125 MG tablet Take 1 tablet (3.125 mg total) by mouth 2 (two) times daily with a meal. 60 tablet 0   . cloNIDine (CATAPRES) 0.1 MG tablet Take 1 tablet (0.1 mg total) by mouth 2 (two) times daily. 60 tablet 0   . hydrALAZINE (APRESOLINE) 10 MG tablet Take 1 tablet (10 mg total) by mouth every 8 (eight) hours. 90 tablet 0   . hydrOXYzine (ATARAX/VISTARIL) 25 MG tablet Take 1 tablet (25 mg total) by mouth 4 (four) times daily. 30 tablet 0   . isosorbide mononitrate (IMDUR) 60 MG 24 hr tablet Take 1 tablet (60 mg total) by mouth daily. 30 tablet 0   . OLANZapine (ZYPREXA) 5 MG tablet Take 1 tablet (5 mg total) by mouth 2 (two) times a day. 60 tablet 0     Patient Stressors: Educational concerns Health problems Medication change or noncompliance  Patient Strengths: Average or above average intelligence Capable of independent living Warehouse manager means  Treatment Modalities: Medication Management, Group therapy, Case management,  1 to 1 session with clinician, Psychoeducation, Recreational therapy.   Physician Treatment Plan for Primary Diagnosis: Schizophrenia [facility]) Long Term Goal(s): Improvement in symptoms so as ready for discharge Improvement in symptoms  so as ready for discharge   Short Term Goals: Ability to verbalize feelings will improve Ability to demonstrate self-control will improve Compliance with prescribed medications will improve  Medication Management: Evaluate patient's response, side effects, and tolerance of medication regimen.  Therapeutic Interventions: 1 to 1 sessions, Unit Group sessions and Medication administration.  Evaluation of Outcomes: Not Met  Physician Treatment Plan for Secondary Diagnosis: Principal Problem:   Schizophrenia ([place]) Active Problems:   Essential hypertension   Obesity (BMI 30-39.9)   Hypertensive hypertrophic cardiomyopathy: NYHA class II:    Long Term Goal(s): Improvement in symptoms so as ready for  discharge Improvement in symptoms so as ready for discharge   Short Term Goals: Ability to verbalize feelings will improve Ability to demonstrate self-control will improve Compliance with prescribed medications will improve     Medication Management: Evaluate patient's response, side effects, and tolerance of medication regimen.  Therapeutic Interventions: 1 to 1 sessions, Unit Group sessions and Medication administration.  Evaluation of Outcomes: Not Met   RN Treatment Plan for Primary Diagnosis: Schizophrenia ([place]) Long Term Goal(s): Knowledge of disease and therapeutic regimen to maintain health will improve  Short Term Goals: Ability to verbalize feelings will improve, Ability to identify and develop effective coping behaviors will improve and Compliance with prescribed medications will improve  Medication Management: RN will administer medications as ordered by provider, will assess and evaluate patient's response and provide education to patient for prescribed medication. RN will report any adverse and/or side effects to prescribing provider.  Therapeutic Interventions: 1 on 1 counseling sessions, Psychoeducation, Medication administration, Evaluate responses to treatment, Monitor vital signs and CBGs as ordered, Perform/monitor CIWA, COWS, AIMS and Fall Risk screenings as ordered, Perform wound care treatments as ordered.  Evaluation of Outcomes: Not Met   LCSW Treatment Plan for Primary Diagnosis: Schizophrenia ([place]) Long Term Goal(s): Safe transition to appropriate next level of care at discharge, Engage patient in therapeutic group addressing interpersonal concerns.  Short Term Goals: Engage patient in aftercare planning with referrals and resources  Therapeutic Interventions: Assess for all discharge needs, 1 to 1 time with Social worker, Explore available resources and support systems, Assess for adequacy in community support network, Educate family and significant other(s)  on suicide prevention, Complete Psychosocial Assessment, Interpersonal group therapy.  Evaluation of Outcomes: Not Met   Progress in Treatment: Attending groups: No. Participating in groups: No. Taking medication as prescribed: Yes. Toleration medication: Yes. Family/Significant other contact made: Yes, individual(s) contacted:  pt declined family contact Patient understands diagnosis: No. Discussing patient identified problems/goals with staff: No. Medical problems stabilized or resolved: Yes. Denies suicidal/homicidal ideation: Yes. Issues/concerns per patient self-inventory: No. Other: NA  New problem(s) identified: No, Describe:  None  New Short Term/Long Term Goal(s): Pt declined to attend today's treatment team meeting.   Patient Goals:  Goal not obtained at this time.  Discharge Plan or Barriers: Pt declined follow up. D/C plan TBD.  Reason for Continuation of Hospitalization: Delusions  Medication stabilization  Estimated Length of Stay: 5-7 days   Recreational Therapy: Patient stressors: N/A Patient Goal: Patient will engage in groups without prompting or encouragement from LRT x3 group sessions within 5 recreation therapy group sessions  Attendees: Patient: 09/16/2018 2:55 PM  Physician: Alethia Berthold 09/16/2018 2:55 PM  Nursing:  09/16/2018 2:55 PM  RN Care Manager: 09/16/2018 2:55 PM  Social Worker: Darren Lehman Prom Assunta Curtis [facility] 09/16/2018 2:55 PM  Recreational Therapist: Roanna Epley 09/16/2018 2:55 PM  Other:  09/16/2018 2:55 PM  Other:  09/16/2018 2:55 PM  Other: 09/16/2018 2:55 PM    Scribe for Treatment Team: Yvette Rack, LCSW 09/16/2018 2:55 PM

## 2018-09-16 NOTE — BHH Counselor (Signed)
Pt was only willing to allow CSW to speak with son, [place], [phone], when she was present. He reports that pt has Schizophrenia and Delusional Disorder.  He reports "she needs to be taking her medication".    CSW attempted to have patient give son her code and the nurses station number, however, pt interrupted and declined for son to have that information.  Son reports that he was unaware that his mother was in the hospital.  He reports that [place] call his brother [name], however pt became agitated and angry and began to yell before he could provide a number.    Pt again declined to sign a consent for contact and asked for call to be ended.  Assunta Curtis, MSW, LCSW 09/16/2018 10:50 AM

## 2018-09-16 NOTE — Progress Notes (Signed)
[facility]  09/16/2018 1:22 PM Erin Riley  MRN:  [id number] Subjective: Follow-up for this patient with schizophrenia and multiple medical problems.  Patient seen chart reviewed.  Patient's only complaint was that her blood pressure remained high.  She did have a particularly high reading at 1 point this morning but overall she has had much better control of her blood pressure than previously on her current medicine.  Patient did not spontaneously voice anything psychotic but when I asked her about whether she was still pregnant she tacitly acknowledge that belief but said she did not want to have any help with it right now.  Patient denied suicidal or homicidal thought.  Patient has spoken with social work who had been trying to get in touch with her family.  Although the patient says that she wants to go home she has been resistant to allowing social work to get in touch with her family because of her own confusion and paranoia.  Patient has not been aggressive violent or threatening and has been compliant with most medication Principal Problem: Schizophrenia (HCC) Diagnosis: Principal Problem:   Schizophrenia (HCC) Active Problems:   Essential hypertension   Obesity (BMI 30-39.9)   Hypertensive hypertrophic cardiomyopathy: NYHA class II:    Total Time spent with patient: 30 minutes  Past Psychiatric History: Past history of schizophrenia chronic attempts to get her onto medication with limited success  Past Medical History:  Past Medical History:  Diagnosis Date  . Chronic back pain   . Chronic kidney disease (CKD), stage II (mild)    Class I-II  . Coronary artery disease 04/2009   50% stenosis in the perforator of LAD; catheterization was for an abnormal Myoview in January 2000 showing anterior and inferolateral ischemia.  . Diverticulitis   . 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 > 3 subsequent Echos show improved/normal EF with moderate to severe concentric LVH and diastolic dysfunction with LVOT/intracavitary gradient --> 06/2016: Severe LVH.  Vigorous EF, 65-70%.?? Gr 1 DD. Mild AS.  [name]. Hyperlipidemia   . Hypertension   . Hypertensive hypertrophic cardiomyopathy: NYHA class II:  Echo: Severe concentric LVH with LV OT gradient; essentially preserved EF with diastolic dysfunction 02/15/2013   Echo 06/2016: Severe Concentric LVH. Vigorous EF 65-70%. ~ Gr I DD.   . Mild aortic stenosis by prior echocardiography    Echo 06/2016: Mild AS (Mean Gradient 15 mmHg); has had prior Mod-Severe MR (not seen on current echo)  . PAD (peripheral artery disease) [facility](HCC) March 2013   Lower extremity Dopplers: R. SFA 50-60%, R. PTA proximally occluded with distal reconstitution;; L. common iliac ~50%, L. SFA 50-70% stenosis, L. PTA < 50%  . Schizophrenia [facility](HCC)     Past Surgical History:  Procedure Laterality Date  . BUNIONECTOMY    . carotid doppler  05/29/2011   left bulb/prox ICA moderate amtfibrous plaque with no evidence significant reduction.,right bulb /proximal ICA normal patency  . lower extremity doppler  05/29/2011   right SFA 50% to 59% diameter reduction,right posterior tibal atreery occlusive disease,reconstituting distally, left common illiac<50%,left SFA 50 to70%,left post. tibial <50%  . NM MYOCAR PERF WALL MOTION  03/2009   Persantine; EF 51%-both anterior and inferolateral ischemia  . TRANSTHORACIC ECHOCARDIOGRAM  06/2016   Severe LVH.  Vigorous EF of 65-70%.  No RWMA. ~Only grade 1 diastolic dysfunction.  Mild aortic stenosis (mean gradient 15 mmHg)  .  TRANSTHORACIC ECHOCARDIOGRAM  07/2012   EF 50-55%; severe concentric LVH; only grade 1 diastolic dysfunction. Mild aortic sclerosis - with LVOT /intracavitary gradient of roughly 20 mmHg mean. Mild to moderately dilated LA;; previously reported MR not seen   Family History:  Family History  Problem Relation Age of  Onset  . Hypertension Mother   . Breast cancer Neg Hx    Family Psychiatric  History: See previous Social History:  Social History   Substance and Sexual Activity  Alcohol Use No     Social History   Substance and Sexual Activity  Drug Use No    Social History   Socioeconomic History  . Marital status: Single    Spouse name: Not on file  . Number of children: 7  . Years of education: Not on file  . Highest education level: Not on file  Occupational History  . Not on file  Social Needs  . Financial resource strain: Not on file  . Food insecurity    Worry: Not on file    Inability: Not on file  . Transportation needs    Medical: Not on file    Non-medical: Not on file  Tobacco Use  . Smoking status: Former Smoker    Types: Cigarettes    Quit date: 05/14/2002    Years since quitting: 16.3  . Smokeless tobacco: Never Used  Substance and Sexual Activity  . Alcohol use: No  . Drug use: No  . Sexual activity: Yes    Birth control/protection: Post-menopausal  Lifestyle  . Physical activity    Days per week: Not on file    Minutes per session: Not on file  . Stress: Not on file  Relationships  . Social Musicianconnections    Talks on phone: Not on file    Gets together: Not on file    Attends religious service: Not on file    Active member of club or organization: Not on file    Attends meetings of clubs or organizations: Not on file    Relationship status: Not on file  Other Topics Concern  . Not on file  Social History Narrative   Now single mother of 2 with one grandchild. She quit smoking roughly 5 years ago and is not so since. She has also stopped drinking alcohol. She does try get routine exercise walking at least a mile 3-4 days a week.    She lives with her 67 year old mother. 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   Additional Social History:                         Sleep: Fair  Appetite:  Fair  Current Medications: Current Facility-Administered  Medications  Medication Dose Route Frequency Provider Last Rate Last Dose  . acetaminophen (TYLENOL) tablet 650 mg  650 mg Oral Q6H PRN Money, Gerlene Burdockravis B, FNP   650 mg at 09/13/18 1500  . alum & mag hydroxide-simeth (MAALOX/MYLANTA) 200-200-20 MG/5ML suspension 30 mL  30 mL Oral Q4H PRN Money, Feliz Beamravis B, FNP      . carvedilol (COREG) tablet 50 mg  50 mg Oral BID WC Malvin JohnsFarah, Brian, MD   50 mg at 09/16/18 0727  . famotidine (PEPCID) tablet 20 mg  20 mg Oral QHS Money, Gerlene Burdockravis B, FNP   20 mg at 09/15/18 2142  . hydrALAZINE (APRESOLINE) tablet 100 mg  100 mg Oral Q8H Money, Travis B, FNP   100 mg at 09/16/18 0717  . hydrOXYzine (  ATARAX/VISTARIL) tablet 25 mg  25 mg Oral TID PRN Money, Gerlene Burdockravis B, FNP      . isosorbide mononitrate (IMDUR) 24 hr tablet 90 mg  90 mg Oral Daily Money, Gerlene Burdockravis B, FNP   90 mg at 09/16/18 0731  . magnesium hydroxide (MILK OF MAGNESIA) suspension 30 mL  30 mL Oral Daily PRN Money, Gerlene Burdockravis B, FNP      . OLANZapine (ZYPREXA) tablet 10 mg  10 mg Oral QHS Malvin JohnsFarah, Brian, MD   10 mg at 09/15/18 2141  . OLANZapine (ZYPREXA) tablet 5 mg  5 mg Oral Daily Malvin JohnsFarah, Brian, MD   5 mg at 09/16/18 0730  . ondansetron (ZOFRAN) tablet 4 mg  4 mg Oral Q6H PRN Money, Gerlene Burdockravis B, FNP      . pantoprazole (PROTONIX) EC tablet 40 mg  40 mg Oral Daily Faria Casella, Jackquline DenmarkJohn T, MD   40 mg at 09/16/18 0732  . senna-docusate (Senokot-S) tablet 1 tablet  1 tablet Oral QHS PRN Money, Gerlene Burdockravis B, FNP        Lab Results: No results found for this or any previous visit (from the past 48 hour(s)).  Blood Alcohol level:  Lab Results  Component Value Date   ETH <10 04/18/2018   ETH <10 10/04/2017    Metabolic Disorder Labs: Lab Results  Component Value Date   HGBA1C 5.9 (H) 06/09/2016   MPG 123 06/09/2016   MPG 105 07/31/2012   Lab Results  Component Value Date   PROLACTIN 16.5 06/09/2016   Lab Results  Component Value Date   CHOL 123 08/31/2017   TRIG 120 08/31/2017   HDL 40 08/31/2017   CHOLHDL 3.1 08/31/2017    VLDL 55 (H) 06/09/2016   LDLCALC 59 08/31/2017   LDLCALC 80 06/09/2016    Physical Findings: AIMS:  , ,  ,  ,    CIWA:    COWS:     Musculoskeletal: Strength & Muscle Tone: within normal limits Gait & Station: normal Patient leans: N/A  Psychiatric Specialty Exam: Physical Exam  Nursing note and vitals reviewed. Constitutional: She appears well-developed and well-nourished.  HENT:  Head: Normocephalic and atraumatic.  Eyes: Pupils are equal, round, and reactive to light. Conjunctivae are normal.  Neck: Normal range of motion.  Cardiovascular: Regular rhythm and normal heart sounds.  Respiratory: Effort normal. No respiratory distress.  GI: Soft.  Musculoskeletal: Normal range of motion.  Neurological: She is alert.  Skin: Skin is warm and dry.  Psychiatric: Her affect is blunt. Her speech is delayed. She is slowed. Thought content is paranoid and delusional. Cognition and memory are impaired. She expresses impulsivity. She expresses no homicidal and no suicidal ideation.    Review of Systems  Constitutional: Negative.   HENT: Negative.   Eyes: Negative.   Respiratory: Negative.   Cardiovascular: Negative.   Gastrointestinal: Negative.   Musculoskeletal: Negative.   Skin: Negative.   Neurological: Negative.   Psychiatric/Behavioral: Negative.     Blood pressure 102/60, pulse 79, temperature 98.5 F (36.9 C), temperature source Oral, resp. rate 18, height 5\' 8"  (1.727 m), weight 113.4 kg, last menstrual period 05/10/2013, SpO2 99 %.Body mass index is 38.01 kg/m.  General Appearance: Casual  Eye Contact:  Fair  Speech:  Slow  Volume:  Decreased  Mood:  Euthymic  Affect:  Constricted  Thought Process:  Coherent  Orientation:  Full (Time, Place, and Person)  Thought Content:  Illogical, Delusions and Tangential  Suicidal Thoughts:  No  Homicidal Thoughts:  No  Memory:  Immediate;   Fair Recent;   Fair Remote;   Fair  Judgement:  Impaired  Insight:  Lacking   Psychomotor Activity:  Decreased  Concentration:  Concentration: Fair  Recall:  [place] of Knowledge:  Poor  Language:  Fair  Akathisia:  No  Handed:  Right  AIMS (if indicated):     Assets:  Communication Skills Desire for Improvement Financial Resources/Insurance Housing Social Support  ADL's:  Impaired  Cognition:  Impaired,  Mild  Sleep:  Number of Hours: 6.75     Treatment Plan Summary: Daily contact with patient to assess and evaluate symptoms and progress in treatment, Medication management and Plan Patient with chronic psychotic symptoms.  Not acutely aggressive violent or dangerous.  Taking her medicine.  Blood pressure improved.  Patient does not really meet criteria for requiring inpatient treatment and is unlikely to benefit from further inpatient hospitalization.  Optimal plan at this point should be trying to get her back home with her family.  The patient is not capable of making lucid judgments about whether to allow the social work to contact her family.  Her decisions have been based on psychotic thinking.  It would be the appropriate thing for [place] to reach out to the family in spite of this for the patient's own good.  Meanwhile continue antihypertensive and antipsychotic medication.  Alethia Berthold, MD 09/16/2018, 1:22 PM

## 2018-09-16 NOTE — Plan of Care (Signed)
  Problem: Education: Goal: Knowledge of [place] [facility] Education information/materials will improve Outcome: Not Progressing Goal: Emotional status will improve Outcome: Not Progressing Goal: Mental status will improve Outcome: Not Progressing Goal: Verbalization of understanding the information provided will improve Outcome: Not Progressing  D: Patient has been isolative to self. Affect flat. Went outside with patients. Denies SI, HI and AVH. A: Continue to monitor for safety. R: Safety maintained

## 2018-09-17 MED ORDER — HYDRALAZINE HCL 100 MG PO TABS: 100.0000 mg | ORAL_TABLET | Freq: Three times a day (TID) | ORAL | 0 refills | Status: DC

## 2018-09-17 MED ORDER — CARVEDILOL 25 MG PO TABS: 50.0000 mg | ORAL_TABLET | Freq: Two times a day (BID) | ORAL | 0 refills | Status: DC

## 2018-09-17 MED ORDER — OLANZAPINE 5 MG PO TABS: 5.0000 mg | ORAL_TABLET | Freq: Every day | ORAL | 0 refills | Status: DC

## 2018-09-17 MED ORDER — FAMOTIDINE 20 MG PO TABS: 20.0000 mg | ORAL_TABLET | Freq: Every day | ORAL | 0 refills | Status: DC

## 2018-09-17 MED ORDER — OLANZAPINE 10 MG PO TABS: 10.0000 mg | ORAL_TABLET | Freq: Every day | ORAL | 0 refills | Status: DC

## 2018-09-17 MED ORDER — HYDROXYZINE HCL 25 MG PO TABS: 25.0000 mg | ORAL_TABLET | Freq: Three times a day (TID) | ORAL | 0 refills | Status: DC | PRN

## 2018-09-17 MED ORDER — ISOSORBIDE MONONITRATE ER 30 MG PO TB24: 90.0000 mg | ORAL_TABLET | Freq: Every day | ORAL | 0 refills | Status: DC

## 2018-09-17 NOTE — [facility] Suicide Risk Assessment (Signed)
[facility] Discharge Suicide Risk Assessment   Principal Problem: Schizophrenia [facility]) Discharge Diagnoses: Principal Problem:   Schizophrenia ([place]) Active Problems:   Essential hypertension   Obesity (BMI 30-39.9)   Hypertensive hypertrophic cardiomyopathy: NYHA class II:     Total Time spent with patient: 45 minutes  Musculoskeletal: Strength & Muscle Tone: within normal limits Gait & Station: normal Patient leans: N/A  Psychiatric Specialty Exam: Review of Systems  Constitutional: Negative.   HENT: Negative.   Eyes: Negative.   Respiratory: Negative.   Cardiovascular: Negative.   Gastrointestinal: Negative.   Musculoskeletal: Negative.   Skin: Negative.   Neurological: Negative.   Psychiatric/Behavioral: Negative.     Blood pressure 125/65, pulse 79, temperature 98.2 F (36.8 C), temperature source Oral, resp. rate 18, height 5\' 8"  (1.727 m), weight 113.4 kg, last menstrual period 05/10/2013, SpO2 99 %.Body mass index is 38.01 kg/m.  General Appearance: Casual  Eye Contact::  Good  Speech:  Normal Rate409  Volume:  Normal  Mood:  Euthymic  Affect:  Congruent  Thought Process:  Goal Directed  Orientation:  Full (Time, Place, and Person)  Thought Content:  Logical  Suicidal Thoughts:  No  Homicidal Thoughts:  No  Memory:  Immediate;   Fair Recent;   Fair Remote;   Fair  Judgement:  Fair  Insight:  Shallow  Psychomotor Activity:  Normal  Concentration:  Fair  Recall:  Green  Language: Fair  Akathisia:  No  Handed:  Right  AIMS (if indicated):     Assets:  Desire for Improvement Housing Physical Health Resilience Social Support  Sleep:  Number of Hours: 7.3  Cognition: Impaired,  Mild  ADL's:  Intact   Mental Status Per Nursing Assessment::   On Admission:  NA  Demographic Factors:  Unemployed  Loss Factors: NA  Historical Factors: NA  Risk Reduction Factors:   Sense of responsibility to family, Religious beliefs about  death and Positive social support  Continued Clinical Symptoms:  Schizophrenia:   Paranoid or undifferentiated type  Cognitive Features That Contribute To Risk:  None    Suicide Risk:  Minimal: No identifiable suicidal ideation.  Patients presenting with no risk factors but with morbid ruminations; may be classified as minimal risk based on the severity of the depressive symptoms  Follow-up Information    Patient declined. Follow up.   Contact information: Patient declined aftercare referral.          Plan Of Care/Follow-up recommendations:  Activity:  Activity as tolerated Diet:  Heart healthy diet with salt limitation Other:  Patient was counseled several times about the importance of controlling her blood pressure.  Reminded of the severe negative consequences of untreated hypertension including kidney failure stroke heart attack death.  Patient states an understanding of that and is agreeable to continuing her medicine for blood pressure.  He has been ambivalent at best about any psychiatric medicine and declined follow-up for outpatient psychiatric care but at this point there is no sign of any acute dangerousness.  She is calm and appropriate with no suicidal ideation and is agreeable to going back to stay with her family.  Alethia Berthold, MD 09/17/2018, 2:45 PM

## 2018-09-17 NOTE — Plan of Care (Signed)
Pt denies depression, anxiety, SI, HI and AVH. Pt was educated on care plan and verbalizes understanding. Collier Bullock RN Problem: Education: Goal: Knowledge of [place] [facility] Education information/materials will improve Outcome: Progressing Goal: Emotional status will improve Outcome: Progressing Goal: Mental status will improve Outcome: Progressing Goal: Verbalization of understanding the information provided will improve Outcome: Progressing   Problem: Coping: Goal: Ability to verbalize frustrations and anger appropriately will improve Outcome: Progressing Goal: Ability to demonstrate self-control will improve Outcome: Progressing   Problem: Health Behavior/Discharge Planning: Goal: Compliance with treatment plan for underlying cause of condition will improve Outcome: Progressing   Problem: Activity: Goal: Will verbalize the importance of balancing activity with adequate rest periods Outcome: Progressing   Problem: Education: Goal: Will be free of psychotic symptoms Outcome: Progressing Goal: Knowledge of the prescribed therapeutic regimen will improve Outcome: Progressing   Problem: Coping: Goal: Coping ability will improve Outcome: Progressing   Problem: Health Behavior/Discharge Planning: Goal: Compliance with prescribed medication regimen

## 2018-09-17 NOTE — Plan of Care (Signed)
  Problem: Education: Goal: Knowledge of [place] [facility] Education information/materials will improve Outcome: Progressing Goal: Emotional status will improve Outcome: Progressing Goal: Mental status will improve Outcome: Progressing Goal: Verbalization of understanding the information provided will improve Outcome: Progressing   Problem: Coping: Goal: Ability to verbalize frustrations and anger appropriately will improve Outcome: Progressing Goal: Ability to demonstrate self-control will improve Outcome: Progressing   Problem: Health Behavior/Discharge Planning: Goal: Compliance with treatment plan for underlying cause of condition will improve Outcome: Progressing   Problem: Activity: Goal: Will verbalize the importance of balancing activity with adequate rest periods Outcome: Progressing   Problem: Education: Goal: Will be free of psychotic symptoms Outcome: Progressing Goal: Knowledge of the prescribed therapeutic regimen will improve Outcome: Progressing   Problem: Coping: Goal: Coping ability will improve Outcome: Progressing   Problem: Health Behavior/Discharge Planning: Goal: Compliance with prescribed medication regimen will improve Outcome: Progressing

## 2018-09-17 NOTE — BHH Counselor (Signed)
CSW spoke with the patients son Denzel to identify whre patient lives and see if he could pick her up. He stated that the address on file is "where he mail goes, that is my brothers address but she doesn't live there".  He reported that he was at work and unable to speak further and will call this CSW back.  Assunta Curtis, MSW, LCSW 09/17/2018 10:01 AM

## 2018-09-17 NOTE — BHH Counselor (Signed)
CSW provided the patient with outpatient resources.  Patient was receptive to the list.    CSW attempted to see if CSW could support with scheduling an aftercare appointment and patient again declined.   Assunta Curtis, MSW, LCSW 09/17/2018 2:38 PM

## 2018-09-17 NOTE — Progress Notes (Signed)
Patient accept to get her BP done 179/97, but refused to take her hydralazine  bp meds, expressing that is pink and refused it for that reason.

## 2018-09-17 NOTE — Progress Notes (Signed)
Pt received her belongings, perscriptions, AVS, transition record and suicide risk assessment. Pt was educated on dc plan and verbalizes understanding. Pt denies SI, HI and AVS. Pt dc to taxi to go home. Collier Bullock RN

## 2018-09-17 NOTE — Progress Notes (Signed)
Patient is refusing the nursing  staff to check her blood pressure this morning , stating that we are the reason  her  blood pressure is high.and would not let us check it.

## 2018-09-17 NOTE — Progress Notes (Signed)
Recreation Therapy Notes  INPATIENT RECREATION TR PLAN  Patient Details Name: MAKAIA RAPPA MRN: [id number] DOB: 20-Feb-1951 Today's Date: 09/17/2018  Rec Therapy Plan Is patient appropriate for Therapeutic Recreation?: Yes Treatment times per week: at least 3 Estimated Length of Stay: 5-7 days TR Treatment/Interventions: Group participation (Comment)  Discharge Criteria Pt will be discharged from therapy if:: Discharged Treatment plan/goals/alternatives discussed and agreed upon by:: Patient/family  Discharge Summary Short term goals set: Patient will engage in groups without prompting or encouragement from LRT x3 group sessions within 5 recreation therapy group sessions Short term goals met: Not met Reason goals not met: Patient did not attend any groups Therapeutic equipment acquired: N/A Reason patient discharged from therapy: Discharge from hospital Pt/family agrees with progress & goals achieved: Yes Date patient discharged from therapy: 09/17/18   Isabellarose Kope 09/17/2018, 1:50 PM

## 2018-09-17 NOTE — Discharge Summary (Signed)
Physician Discharge Summary Note  Patient:  Erin Riley is an 68 y.o., female MRN:  [id number] DOB:  [birth year] Patient phone:  [phone] (home)  Patient address:   [address] [place] [id number],  Total Time spent with patient: 30 minutes  Date of Admission:  09/13/2018 Date of Discharge: 09/17/2018  Reason for Admission:     History of Present Illness: Patient seen and chart reviewed.  68 year old woman with a past history of schizophrenia transferred to us from [place].  Patient was only partially cooperative with the interview much of the history was obtained from the chart.  It looks like she presented to the emergency room in [place] with complaints of burning in her urine and some other medical concerns.  While she was being evaluated it became apparent that first of all her blood pressure was badly controlled and second of all that she was delusional.  Patient was initially admitted to the medical service for evaluation of her hypertension and how it was affecting her cardiomyopathy.  Once she was cleared medically she was seen by psychiatry who felt she needed inpatient treatment.  It was documented that the patient continued to state that she was pregnant and that frequently she would yell at people.  On interview today the patient tells me that I should know everything if I just read the chart.  Goes on to tell me she came to the hospital because she was off her blood pressure medicine.  She denies any belief that she is pregnant.  Denies any hallucinations.  Denies any mood symptoms denies suicidal or homicidal ideation.  She is rather dismissive and on helpful in discussing her social situation but says she still lives with her son.  Denies alcohol or drug abuse Associated Signs/Symptoms: Depression Symptoms:  difficulty concentrating, (Hypo) Manic Symptoms:  Irritable Mood, Anxiety Symptoms:  None reported Psychotic Symptoms:  Paranoia, PTSD  Symptoms: Negative Total Time spent with patient: 1 hour  Past Psychiatric History: Patient has been admitted to psychiatric hospital several times previously.  In 2019 she was at behavioral [facility].  Eventually stabilized on Zyprexa although it sounds like even at her best her baseline continued to be somewhat disorganized.  Since then she has been to the emergency room several times usually for medical complaints and on at least a couple of those has been referred to psychiatric hospitals.  No known history of suicide attempts or violence.  Looks like Zyprexa has been used on several occasions with some benefit.  Patient not willing to discuss any outpatient treatment not clear whether she goes for any. Principal Problem: Schizophrenia [facility](HCC) Discharge Diagnoses: Principal Problem:   Schizophrenia (HCC) Active Problems:   Essential hypertension   Obesity (BMI 30-39.9)   Hypertensive hypertrophic cardiomyopathy: NYHA class II:     Past Medical History:  Past Medical History:  Diagnosis Date  . Chronic back pain   . Chronic kidney disease (CKD), stage II (mild)    Class I-II  . Coronary artery disease 04/2009   50% stenosis in the perforator of LAD; catheterization was for an abnormal Myoview in January 2000 showing anterior and inferolateral ischemia.  . Diverticulitis   . 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 > 3 subsequent Echos show improved/normal EF with moderate to severe concentric LVH and diastolic dysfunction with LVOT/intracavitary gradient --> 06/2016: Severe LVH.  Vigorous EF, 65-70%.?? Gr  1 DD. Mild AS.  [name]. Hyperlipidemia   . Hypertension   . Hypertensive hypertrophic cardiomyopathy: NYHA class II:  Echo: Severe concentric LVH with LV OT gradient; essentially preserved EF with diastolic dysfunction 02/15/2013   Echo 06/2016: Severe Concentric LVH. Vigorous EF 65-70%. ~ Gr I  DD.   . Mild aortic stenosis by prior echocardiography    Echo 06/2016: Mild AS (Mean Gradient 15 mmHg); has had prior Mod-Severe MR (not seen on current echo)  . PAD (peripheral artery disease) [facility](HCC) March 2013   Lower extremity Dopplers: R. SFA 50-60%, R. PTA proximally occluded with distal reconstitution;; L. common iliac ~50%, L. SFA 50-70% stenosis, L. PTA < 50%  . Schizophrenia [facility](HCC)     Past Surgical History:  Procedure Laterality Date  . BUNIONECTOMY    . carotid doppler  05/29/2011   left bulb/prox ICA moderate amtfibrous plaque with no evidence significant reduction.,right bulb /proximal ICA normal patency  . lower extremity doppler  05/29/2011   right SFA 50% to 59% diameter reduction,right posterior tibal atreery occlusive disease,reconstituting distally, left common illiac<50%,left SFA 50 to70%,left post. tibial <50%  . NM MYOCAR PERF WALL MOTION  03/2009   Persantine; EF 51%-both anterior and inferolateral ischemia  . TRANSTHORACIC ECHOCARDIOGRAM  06/2016   Severe LVH.  Vigorous EF of 65-70%.  No RWMA. ~Only grade 1 diastolic dysfunction.  Mild aortic stenosis (mean gradient 15 mmHg)  . TRANSTHORACIC ECHOCARDIOGRAM  07/2012   EF 50-55%; severe concentric LVH; only grade 1 diastolic dysfunction. Mild aortic sclerosis - with LVOT /intracavitary gradient of roughly 20 mmHg mean. Mild to moderately dilated LA;; previously reported MR not seen   Family History:  Family History  Problem Relation Age of Onset  . Hypertension Mother   . Breast cancer Neg Hx    Family Psychiatric  History: As per patient she denies Social History:  Social History   Substance and Sexual Activity  Alcohol Use No     Social History   Substance and Sexual Activity  Drug Use No    Social History   Socioeconomic History  . Marital status: Single    Spouse name: Not on file  . Number of children: 7  . Years of education: Not on file  . Highest education level: Not on file  Occupational  History  . Not on file  Social Needs  . Financial resource strain: Not on file  . Food insecurity    Worry: Not on file    Inability: Not on file  . Transportation needs    Medical: Not on file    Non-medical: Not on file  Tobacco Use  . Smoking status: Former Smoker    Types: Cigarettes    Quit date: 05/14/2002    Years since quitting: 16.3  . Smokeless tobacco: Never Used  Substance and Sexual Activity  . Alcohol use: No  . Drug use: No  . Sexual activity: Yes    Birth control/protection: Post-menopausal  Lifestyle  . Physical activity    Days per week: Not on file    Minutes per session: Not on file  . Stress: Not on file  Relationships  . Social Musicianconnections    Talks on phone: Not on file    Gets together: Not on file    Attends religious service: Not on file    Active member of club or organization: Not on file    Attends meetings of clubs or organizations: Not on file    Relationship status: Not  on file  Other Topics Concern  . Not on file  Social History Narrative   Now single mother of 2 with one grandchild. She quit smoking roughly 5 years ago and is not so since. She has also stopped drinking alcohol. She does try get routine exercise walking at least a mile 3-4 days a week.    She lives with her 60 year old mother. 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    Hospital Course: She presented to ED for abdominal pain, hypertensive and new EKG changes. While in the ED she was observed to have some psychosis and delusional, thinking she was pregnant and her water broke. She was subquently admitted to the medical floor, then transferred to [facility]. Once on the unit she was disorganized and having racing thoughts. Today she is vagely irritable yet alert and oriented x 3. She denies any depression, neuro-vegetative symptoms.  Of note, presents fully alert, attentive, and is oriented x 3- no current symptoms of delirium or acute confusion noted. Denies drug or alcohol  abuse.   During the course of her hospitalization, Erin Riley was medicated & discharged on, Olanzapine disintegrating tablets 5 mg po qam and olanzapine 10mg  po qhs  for mood control & Trazodone 100 mg for insomnia. She was enrolled, however did not participate in many groups and counseling sessions that were offered on the unit.  She was resumed on all her pertinent home medications for the other previously existing medical issues presented. She tolerated her treatment regimen without any adverse effects reported.   While her treatment was on going, Erin Riley improvement was monitored by observation & her daily reports of symptom reduction noted. Her emotional & mental status were monitored by daily self-inventory reports completed by her & the clinical staff. She was evaluated daily by the treatment team for mood stability & the need for continued recovery after discharge. She was offered further treatment options upon discharge & will follow up with the outpatient psychiatric services however she has refused to sign release of records.  She was provided with all the necessary information needed to make this appointment without problems.    Upon discharge, Erin Riley was both mentally & medically stable. She is currently denying suicidal, homicidal ideation, auditory, visual/tactile hallucinations, delusional thoughts & or paranoia.  Erin Riley left [facility] with all personal belongings in no apparent distress. Transportation per taxi cab. Erin Riley assisted with taxi cab.      Musculoskeletal: Strength & Muscle Tone: within normal limits Gait & Station: normal Patient leans: N/A  Psychiatric Specialty Exam: See MD SRA Physical Exam  ROS  Blood pressure (!) 172/97, pulse 79, temperature 98.2 F (36.8 C), temperature source Oral, resp. rate 18, height 5\' 8"  (1.727 m), weight 113.4 kg, last menstrual period 05/10/2013, SpO2 99 %.Body mass index is 38.01 kg/m.  Sleep:  Number of Hours: 7.3     Have you used any form of tobacco  in the last 30 days? (Cigarettes, Smokeless Tobacco, Cigars, and/or Pipes): No  Has this patient used any form of tobacco in the last 30 days? (Cigarettes, Smokeless Tobacco, Cigars, and/or Pipes)  No  Blood Alcohol level:  Lab Results  Component Value Date   ETH <10 04/18/2018   ETH <10 67/02/4579    Metabolic Disorder Labs:  Lab Results  Component Value Date   HGBA1C 5.9 (H) 06/09/2016   MPG 123 06/09/2016   MPG 105 07/31/2012   Lab Results  Component Value Date   PROLACTIN 16.5 06/09/2016   Lab Results  Component Value Date   CHOL 123 08/31/2017   TRIG 120 08/31/2017   HDL 40 08/31/2017   CHOLHDL 3.1 08/31/2017   VLDL 55 (H) 06/09/2016   LDLCALC 59 08/31/2017   LDLCALC 80 06/09/2016    See Psychiatric Specialty Exam and Suicide Risk Assessment completed by Attending Physician prior to discharge.  Discharge destination:  Home  Is patient on multiple antipsychotic therapies at discharge:  No   Has Patient had three or more failed trials of antipsychotic monotherapy by history:  No  Recommended Plan for Multiple Antipsychotic Therapies: NA  Discharge Instructions    Discharge instructions   Complete by: As directed    Discharge instructions   Complete by: As directed    Please continue to take medications as directed. If your symptoms return, worsen, or persist please call your 911, report to local ER, or contact crisis hotline. Please do not drink alcohol or use any illegal substances while taking prescription medications.   Discharge patient   Complete by: As directed    Discharge disposition: 01-Home or Self Care   Discharge patient date: 09/17/2018     Allergies as of 09/17/2018   No Known Allergies     Medication List    STOP taking these medications   cloNIDine 0.1 MG tablet Commonly known as: CATAPRES     TAKE these medications     Indication  carvedilol 25 MG tablet Commonly known as: COREG Take 2 tablets (50 mg total) by mouth 2 (two) times  daily with a meal. What changed:   medication strength  how much to take  Indication: High Blood Pressure Disorder   famotidine 20 MG tablet Commonly known as: PEPCID Take 1 tablet (20 mg total) by mouth at bedtime.  Indication: Gastroesophageal Reflux Disease   hydrALAZINE 100 MG tablet Commonly known as: APRESOLINE Take 1 tablet (100 mg total) by mouth every 8 (eight) hours. What changed:   medication strength  how much to take  Indication: High Blood Pressure Disorder   hydrOXYzine 25 MG tablet Commonly known as: ATARAX/VISTARIL Take 1 tablet (25 mg total) by mouth 3 (three) times daily as needed for anxiety. What changed:   when to take this  reasons to take this  Indication: Feeling Anxious   isosorbide mononitrate 30 MG 24 hr tablet Commonly known as: IMDUR Take 3 tablets (90 mg total) by mouth daily. Start taking on: September 18, 2018 What changed:   medication strength  how much to take  Indication: hypertension   OLANZapine 10 MG tablet Commonly known as: ZYPREXA Take 1 tablet (10 mg total) by mouth at bedtime. What changed: You were already taking a medication with the same name, and this prescription was added. Make sure you understand how and when to take each.  Indication: Schizophrenia   OLANZapine 5 MG tablet Commonly known as: ZYPREXA Take 1 tablet (5 mg total) by mouth daily. Start taking on: September 18, 2018 What changed: when to take this  Indication: Schizophrenia        Follow-up recommendations:  Activity:  Increase activity as tolerated Diet:  Routine heart healthy diet Tests:  Routine testing as suggested by outpatient physician Other:  Even if you begin to feel better continue taking your medication.   Signed: Maryagnes Amosakia S Starkes-Perry, FNP 09/17/2018, 1:16 PM

## 2018-09-17 NOTE — Progress Notes (Signed)
Recreation Therapy Notes   Date: 09/17/2018  Time: 9:30 am   Location: Craft room   Behavioral response: N/A   Intervention Topic: Necessities  Discussion/Intervention: Patient did not attend group.   Clinical Observations/Feedback:  Patient did not attend group.   Kimberli Winne LRT/CTRS        Erin Riley 09/17/2018 10:45 AM 

## 2018-09-17 NOTE — B[facility] Group Notes (Signed)
Feelings Around Diagnosis 09/17/2018 1PM  Type of Therapy/Topic:  Group Therapy:  Feelings about Diagnosis  Participation Level:  Did Not Attend   Description of Group:   This group will allow patients to explore their thoughts and feelings about diagnoses they have received. Patients will be guided to explore their level of understanding and acceptance of these diagnoses. Facilitator will encourage patients to process their thoughts and feelings about the reactions of others to their diagnosis and will guide patients in identifying ways to discuss their diagnosis with significant others in their lives. This group will be process-oriented, with patients participating in exploration of their own experiences, giving and receiving support, and processing challenge from other group members.   Therapeutic Goals: 1. Patient will demonstrate understanding of diagnosis as evidenced by identifying two or more symptoms of the disorder 2. Patient will be able to express two feelings regarding the diagnosis 3. Patient will demonstrate their ability to communicate their needs through discussion and/or role play  Summary of Patient Progress:       Therapeutic Modalities:   Cognitive Behavioral Therapy Brief Therapy Feelings Identification    Yvette Rack, LCSW 09/17/2018 2:14 PM

## 2018-09-30 ENCOUNTER — Other Ambulatory Visit: Payer: Self-pay | Admitting: Obstetrics & Gynecology

## 2018-10-01 ENCOUNTER — Inpatient Hospital Stay (HOSPITAL_COMMUNITY)
Admission: AD | Admit: 2018-10-01 | Discharge: 2018-10-01 | Disposition: A | Discharge status: Home / Self Care | Payer: Medicare Other | Attending: Emergency Medicine | Admitting: Emergency Medicine

## 2018-10-01 ENCOUNTER — Encounter (HOSPITAL_COMMUNITY): Payer: Self-pay | Admitting: Emergency Medicine

## 2018-10-01 ENCOUNTER — Other Ambulatory Visit: Payer: Self-pay

## 2018-10-01 DIAGNOSIS — Z87891 Personal history of nicotine dependence: Secondary | ICD-10-CM | POA: Diagnosis not present

## 2018-10-01 DIAGNOSIS — F200 Paranoid schizophrenia: Secondary | ICD-10-CM | POA: Insufficient documentation

## 2018-10-01 DIAGNOSIS — F22 Delusional disorders: Secondary | ICD-10-CM

## 2018-10-01 DIAGNOSIS — I10 Essential (primary) hypertension: Secondary | ICD-10-CM

## 2018-10-01 DIAGNOSIS — Z3202 Encounter for pregnancy test, result negative: Secondary | ICD-10-CM

## 2018-10-01 DIAGNOSIS — I129 Hypertensive chronic kidney disease with stage 1 through stage 4 chronic kidney disease, or unspecified chronic kidney disease: Secondary | ICD-10-CM | POA: Diagnosis not present

## 2018-10-01 DIAGNOSIS — N939 Abnormal uterine and vaginal bleeding, unspecified: Secondary | ICD-10-CM | POA: Diagnosis present

## 2018-10-01 LAB — URINALYSIS, ROUTINE W REFLEX MICROSCOPIC
Bacteria, UA: NONE SEEN
Bilirubin Urine: NEGATIVE
Glucose, UA: NEGATIVE mg/dL
Hgb urine dipstick: NEGATIVE
Ketones, ur: NEGATIVE mg/dL
Leukocytes,Ua: NEGATIVE
Nitrite: NEGATIVE
Protein, ur: 100 mg/dL — AB
Specific Gravity, Urine: 1.018 (ref 1.005–1.030)
pH: 6 (ref 5.0–8.0)

## 2018-10-01 LAB — WET PREP, GENITAL
Clue Cells Wet Prep HPF POC: NONE SEEN
Sperm: NONE SEEN
Trich, Wet Prep: NONE SEEN
Yeast Wet Prep HPF POC: NONE SEEN

## 2018-10-01 LAB — RAPID URINE DRUG SCREEN, HOSP PERFORMED
Amphetamines: NOT DETECTED
Barbiturates: NOT DETECTED
Benzodiazepines: NOT DETECTED
Cocaine: NOT DETECTED
Opiates: NOT DETECTED
Tetrahydrocannabinol: NOT DETECTED

## 2018-10-01 LAB — CBC WITH DIFFERENTIAL/PLATELET
Abs Immature Granulocytes: 0.02 10*3/uL (ref 0.00–0.07)
Basophils Absolute: 0 10*3/uL (ref 0.0–0.1)
Basophils Relative: 0 %
Eosinophils Absolute: 0.2 10*3/uL (ref 0.0–0.5)
Eosinophils Relative: 4 %
HCT: 41 % (ref 36.0–46.0)
Hemoglobin: 13.1 g/dL (ref 12.0–15.0)
Immature Granulocytes: 0 %
Lymphocytes Relative: 33 %
Lymphs Abs: 1.9 10*3/uL (ref 0.7–4.0)
MCH: 30.6 pg (ref 26.0–34.0)
MCHC: 32 g/dL (ref 30.0–36.0)
MCV: 95.8 fL (ref 80.0–100.0)
Monocytes Absolute: 0.4 10*3/uL (ref 0.1–1.0)
Monocytes Relative: 7 %
Neutro Abs: 3.3 10*3/uL (ref 1.7–7.7)
Neutrophils Relative %: 56 %
Platelets: 366 10*3/uL (ref 150–400)
RBC: 4.28 MIL/uL (ref 3.87–5.11)
RDW: 13 % (ref 11.5–15.5)
WBC: 5.8 10*3/uL (ref 4.0–10.5)
nRBC: 0 % (ref 0.0–0.2)

## 2018-10-01 LAB — BASIC METABOLIC PANEL
Anion gap: 8 (ref 5–15)
BUN: 11 mg/dL (ref 8–23)
CO2: 26 mmol/L (ref 22–32)
Calcium: 9.2 mg/dL (ref 8.9–10.3)
Chloride: 105 mmol/L (ref 98–111)
Creatinine, Ser: 1.22 mg/dL — ABNORMAL HIGH (ref 0.44–1.00)
GFR calc Af Amer: 53 mL/min — ABNORMAL LOW (ref >60)
GFR calc non Af Amer: 46 mL/min — ABNORMAL LOW (ref >60)
Glucose, Bld: 109 mg/dL — ABNORMAL HIGH (ref 70–99)
Potassium: 4.7 mmol/L (ref 3.5–5.1)
Sodium: 139 mmol/L (ref 135–145)

## 2018-10-01 LAB — POCT PREGNANCY, URINE: Preg Test, Ur: NEGATIVE

## 2018-10-01 LAB — ETHANOL: Alcohol, Ethyl (B): <10 mg/dL (ref <10)

## 2018-10-01 LAB — SALICYLATE LEVEL: Salicylate Lvl: <7 mg/dL (ref 2.8–30.0)

## 2018-10-01 LAB — ACETAMINOPHEN LEVEL: Acetaminophen (Tylenol), Serum: <10 ug/mL — ABNORMAL LOW (ref 10–30)

## 2018-10-01 MED ORDER — LABETALOL HCL 5 MG/ML IV SOLN
10.0000 mg | Freq: Once | INTRAVENOUS | Status: AC
  Administered 2018-10-01: 13:00:00 10 mg via INTRAVENOUS
  Filled 2018-10-01: qty 4

## 2018-10-01 MED ORDER — HYDRALAZINE HCL 25 MG PO TABS
100.0000 mg | ORAL_TABLET | Freq: Once | ORAL | Status: AC
  Administered 2018-10-01: 100 mg via ORAL
  Filled 2018-10-01: qty 4

## 2018-10-01 NOTE — BHH Counselor (Addendum)
This clinician left a HIPPA compliant voice mail for patient's son, Erin Riley, to obtain collateral information. Patient may be at baseline. Disposition is pending collateral information.  TTS spoke with sons Erin Riley [phone]) and Erin Riley [phone])- they expressed frustration that patient continues to access ED with same delusion.They state that they do not have any safety concerns about patient being discharged. They state that they are currently trying to get guardianship of patient so that she has to see her psychiatrist and take her medications. Per Mordecai Maes, NP patient is psych cleared.

## 2018-10-01 NOTE — Discharge Instructions (Signed)
Your pregnancy test today was negative.  Your blood work was reassuring.   As we discussed, your blood pressure continued to be high. You need to make sure you are taking your medications.

## 2018-10-01 NOTE — ED Notes (Signed)
Pt placed in purple scrubs and belongings inventoried

## 2018-10-01 NOTE — ED Notes (Signed)
Patient verbalizes understanding of discharge instructions. Opportunity for questioning and answers were provided. Armband removed by staff, pt discharged from ED ambulatory to home.  
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
## 2018-10-01 NOTE — ED Triage Notes (Signed)
Pt sent over from MAU. Pt has a psych history. Pt states she is pregnant and bleeding but is not pregnant due to her age. This is not the first time this has happened.

## 2018-10-01 NOTE — MAU Provider Note (Signed)
First Provider Initiated Contact with Patient 10/01/18 1002      S [name] is a 68 y.o. [phone] non-pregnant female who presents to MAU today with complaint of imminent delivery. An imminent delivery code was called on this patient in the [facility] who stated her water broke. Pt is 68 years old.  O BP (!) 209/97 (BP Location: Right Arm)   Pulse 68   Temp 98.5 F (36.9 C) (Oral)   Resp 17   LMP 05/10/2013   SpO2 100%  Physical Exam  Constitutional: She appears well-developed and well-nourished. She appears distressed.  HENT:  Head: Normocephalic and atraumatic.  Respiratory: Effort normal.  Neurological: She is alert.  Skin: She is not diaphoretic.  Psychiatric: Her mood appears anxious. She is agitated. Thought content is delusional. She expresses inappropriate judgment.   A Non pregnant female Medical screening exam complete  When provider went in room to let patient know that her pregnancy test was negative, she stated "no it's not, I just came from the doctor and I'm bleeding from my vagina."  P Transfer to ED Called and spoke with Dr. Ronnald Nian in ED to alert him to transfer, provider aware of vitals and states we do not need to medicate her in MAU prior to transfer to ED Patient transferred by staff to ED  Nugent, Gerrie Nordmann, NP 10/01/2018 10:21 AM

## 2018-10-01 NOTE — Progress Notes (Signed)
Called to imminent del at [facility]	[place].  When staff arrived patient appeared to not be of child bearing age.  Dr Nehemiah Settle assessed patient and instructed staff to go to MAU to determine if patient is pregnant or needs to be transferred to main ED.

## 2018-10-01 NOTE — ED Notes (Signed)
Pt yelling "I need someone to induce my pregnancy. I'm ready to have this baby."

## 2018-10-01 NOTE — BH Assessment (Addendum)
Tele Assessment Note   Patient Name: Erin Riley MRN: [id number] Referring Physician: Ashok Cordia Location of Patient: [facility] ED Location of Provider: [place] is an 68 y.o. female presenting voluntarily to [facility] ED. Patient was sent from maternity unit after reporting that she is pregnant and experiencing vaginal bleeding. Patient reports to this assessor she came to the hospital due to blood in her stool and vaginal bleeding. Patient states she is [redacted] weeks pregnant. Patient is past childbearing age. Patient not forthright with information during assessment process. She denies SI/HI/AVH, states that she has never been diagnosed with any mental illness or had any psychiatric treatment. She reports her only prescribed medication is for blood pressure. She denies and substance use or criminal charges. Per chart review, patient has accessed ED 3 times in 2020 with same delusion and was admitted to [facility]. She was discharged on 7/14. Patient states that she lives with her adult son. This clinician asked if she could contact him for collateral information and she replied "No, he is at work." Patient states she does not have anyone else we can contact for collateral.   Patient is alert and oriented x 3. She is dressed in hospital gown, laying in bed. Her speech is logical, eye contact is fair, and thoughts are organized. Patient's mood is pleasant and affect is congruent. Patient's insight, judgement, and impulse control are impaired. Patient does not appear to be responding to internal stimuli. Patient appears to be delusional.  Diagnosis: Schizophrenia (per history)  Past Medical History:  Past Medical History:  Diagnosis Date  . Chronic back pain   . Chronic kidney disease (CKD), stage II (mild)    Class I-II  . Coronary artery disease 04/2009   50% stenosis in the perforator of LAD; catheterization was for an abnormal Myoview in January 2000 showing anterior and  inferolateral ischemia.  . Diverticulitis   . 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 > 3 subsequent Echos show improved/normal EF with moderate to severe concentric LVH and diastolic dysfunction with LVOT/intracavitary gradient --> 06/2016: Severe LVH.  Vigorous EF, 65-70%.?? Gr 1 DD. Mild AS.  [name] Hyperlipidemia   . Hypertension   . Hypertensive hypertrophic cardiomyopathy: NYHA class II:  Echo: Severe concentric LVH with LV OT gradient; essentially preserved EF with diastolic dysfunction 62/22/9798   Echo 06/2016: Severe Concentric LVH. Vigorous EF 65-70%. ~ Gr I DD.   . Mild aortic stenosis by prior echocardiography    Echo 06/2016: Mild AS (Mean Gradient 15 mmHg); has had prior Mod-Severe MR (not seen on current echo)  . PAD (peripheral artery disease) [facility]) March 2013   Lower extremity Dopplers: R. SFA 50-60%, R. PTA proximally occluded with distal reconstitution;; L. common iliac ~50%, L. SFA 50-70% stenosis, L. PTA < 50%  . Schizophrenia [facility])     Past Surgical History:  Procedure Laterality Date  . BUNIONECTOMY    . carotid doppler  05/29/2011   left bulb/prox ICA moderate amtfibrous plaque with no evidence significant reduction.,right bulb /proximal ICA normal patency  . lower extremity doppler  05/29/2011   right SFA 50% to 59% diameter reduction,right posterior tibal atreery occlusive disease,reconstituting distally, left common illiac<50%,left SFA 50 to70%,left post. tibial <50%  . NM MYOCAR PERF WALL MOTION  03/2009   Persantine; EF 51%-both anterior and inferolateral ischemia  . TRANSTHORACIC ECHOCARDIOGRAM  06/2016   Severe  LVH.  Vigorous EF of 65-70%.  No RWMA. ~Only grade 1 diastolic dysfunction.  Mild aortic stenosis (mean gradient 15 mmHg)  . TRANSTHORACIC ECHOCARDIOGRAM  07/2012   EF 50-55%; severe concentric LVH; only grade 1 diastolic dysfunction. Mild aortic sclerosis - with  LVOT /intracavitary gradient of roughly 20 mmHg mean. Mild to moderately dilated LA;; previously reported MR not seen    Family History:  Family History  Problem Relation Age of Onset  . Hypertension Mother   . Breast cancer Neg Hx     Social History:  reports that she quit smoking about 16 years ago. Her smoking use included cigarettes. She has never used smokeless tobacco. She reports that she does not drink alcohol or use drugs.  Additional Social History:  Alcohol / Drug Use Pain Medications: see MAR Prescriptions: see MAR Over the Counter: see MAR History of alcohol / drug use?: No history of alcohol / drug abuse  CIWA: CIWA-Ar BP: (!) 197/147 Pulse Rate: 75 COWS:    Allergies: No Known Allergies  Home Medications: (Not in a hospital admission)   OB/GYN Status:  Patient's last menstrual period was 05/10/2013.  General Assessment Data [facility] TTS Assessment: In system Is this a Tele or Face-to-Face Assessment?: Tele Assessment Is this an Initial Assessment or a Re-assessment for this encounter?: Initial Assessment Patient Accompanied by:: N/A Language Other than English: No Living Arrangements: Other (Comment)(lives with adult son) What gender do you identify as?: Female Marital status: Single Maiden name: Marinello Pregnancy Status: No Living Arrangements: Children Can pt return to current living arrangement?: Yes Admission Status: Voluntary Is patient capable of signing voluntary admission?: Yes Referral Source: Self/Family/Friend Insurance type: Medicare     Crisis Care Plan Living Arrangements: Children Legal Guardian: (self) Name of Psychiatrist: none Name of Therapist: none  Education Status Is patient currently in school?: No Highest grade of school patient has completed: 12th Is the patient employed, unemployed or receiving disability?: Receiving disability income  Risk to self with the past 6 months Suicidal Ideation:  No Has patient been a risk to self within the past 6 months prior to admission? : No Suicidal Intent: No Has patient had any suicidal intent within the past 6 months prior to admission? : No Is patient at risk for suicide?: No Suicidal Plan?: No Has patient had any suicidal plan within the past 6 months prior to admission? : No Access to Means: No What has been your use of drugs/alcohol within the last 12 months?: denies Previous Attempts/Gestures: No How many times?: 0 Other Self Harm Risks: none noted Triggers for Past Attempts: None known Intentional Self Injurious Behavior: None Family Suicide History: No Recent stressful life event(s): (none noted) Persecutory voices/beliefs?: No Depression: No Depression Symptoms: (none noted) Substance abuse history and/or treatment for substance abuse?: No Suicide prevention information given to non-admitted patients: Not applicable  Risk to Others within the past 6 months Homicidal Ideation: No Does patient have any lifetime risk of violence toward others beyond the six months prior to admission? : No Thoughts of Harm to Others: No Current Homicidal Intent: No Current Homicidal Plan: No Access to Homicidal Means: No Identified Victim: none History of harm to others?: No Assessment of Violence: None Noted Violent Behavior Description: none noted Does patient have access to weapons?: No Criminal Charges Pending?: No Does patient have a court date: No Is patient on probation?: No  Psychosis Hallucinations: None noted Delusions: Somatic(believes she is pregnant)  Mental Status Report  Appearance/Hygiene: In scrubs, Improved Eye Contact: Good Motor Activity: Freedom of movement Speech: Logical/coherent Level of Consciousness: Alert, Quiet/awake Mood: Pleasant Affect: Apathetic Anxiety Level: None Thought Processes: Coherent, Relevant Judgement: Impaired Orientation: Person, Appropriate for developmental age, Place,  Time Obsessive Compulsive Thoughts/Behaviors: None  Cognitive Functioning Concentration: Normal Memory: Recent Intact, Remote Intact Is patient IDD: No Insight: Poor Impulse Control: Fair Appetite: Good Have you had any weight changes? : No Change Sleep: No Change Vegetative Symptoms: None  ADLScreening [facility] Assessment Services) Patient's cognitive ability adequate to safely complete daily activities?: Yes Patient able to express need for assistance with ADLs?: Yes Independently performs ADLs?: Yes (appropriate for developmental age)  Prior Inpatient Therapy Prior Inpatient Therapy: Yes Prior Therapy Dates: 04/2018 Prior Therapy Facilty/Provider(s): [facility] Reason for Treatment: MH  Prior Outpatient Therapy Prior Outpatient Therapy: No Does patient have an ACCT team?: No Does patient have Intensive In-House Services?  : No Does patient have [facility] services? : No Does patient have P4CC services?: No  ADL Screening (condition at time of admission) Patient's cognitive ability adequate to safely complete daily activities?: Yes Is the patient deaf or have difficulty hearing?: No Does the patient have difficulty seeing, even when wearing glasses/contacts?: No Does the patient have difficulty concentrating, remembering, or making decisions?: No Patient able to express need for assistance with ADLs?: Yes Does the patient have difficulty dressing or bathing?: No Independently performs ADLs?: Yes (appropriate for developmental age) Does the patient have difficulty walking or climbing stairs?: No Weakness of Legs: None Weakness of Arms/Hands: None  Home Assistive Devices/Equipment Home Assistive Devices/Equipment: None  Therapy Consults (therapy consults require a physician order) PT Evaluation Needed: No OT Evalulation Needed: No SLP Evaluation Needed: No Abuse/Neglect Assessment (Assessment to be complete while patient is alone) Physical Abuse: Denies Verbal Abuse:  Denies Sexual Abuse: Denies Exploitation of patient/patient's resources: Denies Self-Neglect: Denies Values / Beliefs Cultural Requests During Hospitalization: None Spiritual Requests During Hospitalization: None Consults Spiritual Care Consult Needed: No Social Work Consult Needed: No Merchant navy officerAdvance Directives (For Healthcare) Does Patient Have a Medical Advance Directive?: No Would patient like information on creating a medical advance directive?: No - Patient declined          Disposition: Per Denzil MagnusonLashunda Thomas, NP patinet is psych cleared. Disposition Initial Assessment Completed for this Encounter: Yes  This service was provided via telemedicine using a 2-way, interactive audio and video technology.  Names of all persons participating in this telemedicine service and their role in this encounter. Name: Celedonio MiyamotoMeredith Santos Hardwick, LCSW Role: TTS  Name: Erin RaidMae Riley Role: patient  Name:  Role:   Name:  Role:     Celedonio MiyamotoMeredith  Cassidey Barrales 10/01/2018 3:03 PM

## 2018-10-01 NOTE — ED Notes (Signed)
TTS at bedside. 

## 2018-10-29 ENCOUNTER — Other Ambulatory Visit: Payer: Self-pay

## 2018-10-29 ENCOUNTER — Emergency Department (HOSPITAL_COMMUNITY)
Admission: EM | Admit: 2018-10-29 | Discharge: 2018-10-30 | Disposition: A | Discharge status: Other Acute Inpatient Hospital | Payer: Medicare Other | Attending: Emergency Medicine | Admitting: Emergency Medicine

## 2018-10-29 ENCOUNTER — Encounter (HOSPITAL_COMMUNITY): Payer: Self-pay | Admitting: Emergency Medicine

## 2018-10-29 DIAGNOSIS — Z8659 Personal history of other mental and behavioral disorders: Secondary | ICD-10-CM

## 2018-10-29 DIAGNOSIS — Z79899 Other long term (current) drug therapy: Secondary | ICD-10-CM | POA: Diagnosis not present

## 2018-10-29 DIAGNOSIS — Z87891 Personal history of nicotine dependence: Secondary | ICD-10-CM | POA: Insufficient documentation

## 2018-10-29 DIAGNOSIS — F209 Schizophrenia, unspecified: Secondary | ICD-10-CM | POA: Insufficient documentation

## 2018-10-29 DIAGNOSIS — I2510 Atherosclerotic heart disease of native coronary artery without angina pectoris: Secondary | ICD-10-CM | POA: Insufficient documentation

## 2018-10-29 DIAGNOSIS — N183 Chronic kidney disease, stage 3 (moderate): Secondary | ICD-10-CM | POA: Insufficient documentation

## 2018-10-29 DIAGNOSIS — F22 Delusional disorders: Secondary | ICD-10-CM | POA: Insufficient documentation

## 2018-10-29 DIAGNOSIS — Z20828 Contact with and (suspected) exposure to other viral communicable diseases: Secondary | ICD-10-CM | POA: Insufficient documentation

## 2018-10-29 DIAGNOSIS — I129 Hypertensive chronic kidney disease with stage 1 through stage 4 chronic kidney disease, or unspecified chronic kidney disease: Secondary | ICD-10-CM | POA: Diagnosis not present

## 2018-10-29 LAB — BASIC METABOLIC PANEL
Anion gap: 10 (ref 5–15)
BUN: 21 mg/dL (ref 8–23)
CO2: 24 mmol/L (ref 22–32)
Calcium: 9.7 mg/dL (ref 8.9–10.3)
Chloride: 103 mmol/L (ref 98–111)
Creatinine, Ser: 1.39 mg/dL — ABNORMAL HIGH (ref 0.44–1.00)
GFR calc Af Amer: 45 mL/min — ABNORMAL LOW (ref >60)
GFR calc non Af Amer: 39 mL/min — ABNORMAL LOW (ref >60)
Glucose, Bld: 117 mg/dL — ABNORMAL HIGH (ref 70–99)
Potassium: 4.2 mmol/L (ref 3.5–5.1)
Sodium: 137 mmol/L (ref 135–145)

## 2018-10-29 LAB — RAPID URINE DRUG SCREEN, HOSP PERFORMED
Amphetamines: NOT DETECTED
Barbiturates: NOT DETECTED
Benzodiazepines: NOT DETECTED
Cocaine: NOT DETECTED
Opiates: NOT DETECTED
Tetrahydrocannabinol: NOT DETECTED

## 2018-10-29 LAB — CBC
HCT: 42 % (ref 36.0–46.0)
Hemoglobin: 13.8 g/dL (ref 12.0–15.0)
MCH: 31.1 pg (ref 26.0–34.0)
MCHC: 32.9 g/dL (ref 30.0–36.0)
MCV: 94.6 fL (ref 80.0–100.0)
Platelets: 373 10*3/uL (ref 150–400)
RBC: 4.44 MIL/uL (ref 3.87–5.11)
RDW: 12.4 % (ref 11.5–15.5)
WBC: 7.5 10*3/uL (ref 4.0–10.5)
nRBC: 0 % (ref 0.0–0.2)

## 2018-10-29 LAB — URINALYSIS, ROUTINE W REFLEX MICROSCOPIC
Bilirubin Urine: NEGATIVE
Glucose, UA: NEGATIVE mg/dL
Hgb urine dipstick: NEGATIVE
Ketones, ur: NEGATIVE mg/dL
Nitrite: NEGATIVE
Protein, ur: 100 mg/dL — AB
Specific Gravity, Urine: >1.03 — ABNORMAL HIGH (ref 1.005–1.030)
pH: 5.5 (ref 5.0–8.0)

## 2018-10-29 LAB — URINALYSIS, MICROSCOPIC (REFLEX)

## 2018-10-29 MED ORDER — FAMOTIDINE 20 MG PO TABS
20.0000 mg | ORAL_TABLET | Freq: Every day | ORAL | Status: DC
  Administered 2018-10-30: 20 mg via ORAL
  Filled 2018-10-29: qty 1

## 2018-10-29 MED ORDER — CARVEDILOL 12.5 MG PO TABS: 25.0000 mg | ORAL_TABLET | Freq: Two times a day (BID) | ORAL | Status: DC

## 2018-10-29 MED ORDER — HYDRALAZINE HCL 25 MG PO TABS
100.0000 mg | ORAL_TABLET | Freq: Once | ORAL | Status: AC
  Administered 2018-10-29: 13:00:00 100 mg via ORAL
  Filled 2018-10-29: qty 4

## 2018-10-29 MED ORDER — HYDRALAZINE HCL 50 MG PO TABS
100.0000 mg | ORAL_TABLET | Freq: Three times a day (TID) | ORAL | Status: DC
  Administered 2018-10-30: 01:00:00 100 mg via ORAL
  Filled 2018-10-29 (×4): qty 2

## 2018-10-29 MED ORDER — CARVEDILOL 12.5 MG PO TABS
25.0000 mg | ORAL_TABLET | Freq: Two times a day (BID) | ORAL | Status: DC
  Administered 2018-10-30: 25 mg via ORAL
  Filled 2018-10-29: qty 2

## 2018-10-29 MED ORDER — FAMOTIDINE 20 MG PO TABS
20.0000 mg | ORAL_TABLET | Freq: Once | ORAL | Status: AC
  Administered 2018-10-29: 13:00:00 20 mg via ORAL
  Filled 2018-10-29: qty 1

## 2018-10-29 MED ORDER — ISOSORBIDE MONONITRATE ER 30 MG PO TB24
90.0000 mg | ORAL_TABLET | Freq: Every day | ORAL | Status: DC
  Administered 2018-10-30: 90 mg via ORAL
  Filled 2018-10-29: qty 3

## 2018-10-29 MED ORDER — CARVEDILOL 12.5 MG PO TABS
50.0000 mg | ORAL_TABLET | Freq: Two times a day (BID) | ORAL | Status: DC
  Administered 2018-10-29: 50 mg via ORAL
  Filled 2018-10-29: qty 4
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
## 2018-10-29 NOTE — ED Notes (Signed)
Pt changing into paper scrubs

## 2018-10-29 NOTE — Discharge Planning (Signed)
[facility] notes pt with 5 ED visits in last 6 months.

## 2018-10-29 NOTE — ED Notes (Signed)
Pt belongings inventoried. Keys and Cell phone locked up with security.

## 2018-10-29 NOTE — ED Notes (Signed)
Lunch Tray Ordered @ 1756-per RN called by Levada Dy

## 2018-10-29 NOTE — BH Assessment (Signed)
Per [facility] - no appropriate beds at [facility].
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
## 2018-10-29 NOTE — ED Notes (Signed)
Pt belongings put in patient belongings bag
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
## 2018-10-29 NOTE — Progress Notes (Signed)
Pt meets inpatient criteria per Priscille Loveless, NP. Referral information has been sent to the following hospitals for review:  [facility]  [facility]     Disposition will continue to assist with inpatient placement needs.   Audree Camel, LCSW, [place] Disposition [facility]/TTS [phone]

## 2018-10-29 NOTE — ED Notes (Signed)
Pt asked for washcloth ... pt observed to be putting washcloth in pants as a "pad." pt denied sanitary pad.

## 2018-10-30 DIAGNOSIS — F209 Schizophrenia, unspecified: Secondary | ICD-10-CM | POA: Diagnosis not present

## 2018-10-30 LAB — SARS CORONAVIRUS 2 (TAT 6-24 HRS): SARS Coronavirus 2: NEGATIVE

## 2018-10-30 MED ORDER — OLANZAPINE 5 MG PO TABS
10.0000 mg | ORAL_TABLET | Freq: Every day | ORAL | Status: DC
  Administered 2018-10-30: 10 mg via ORAL
  Filled 2018-10-30: qty 2

## 2018-10-30 MED ORDER — OLANZAPINE 5 MG PO TABS
5.0000 mg | ORAL_TABLET | Freq: Every day | ORAL | Status: DC
  Administered 2018-10-30: 5 mg via ORAL
  Filled 2018-10-30: qty 1

## 2018-10-30 MED ORDER — HYDRALAZINE HCL 50 MG PO TABS
100.0000 mg | ORAL_TABLET | Freq: Three times a day (TID) | ORAL | Status: DC
  Filled 2018-10-30 (×3): qty 2

## 2018-10-30 NOTE — Progress Notes (Addendum)
Accepted to [facility] by Dr Lucilla Edin, Report [phone], Requested pt's IVC copies be faxed to [phone]- CSW will fax copies obtained from ED  Being reviewed by [facility] for admission, CSW provided number for ED for medical/behavior-during-shift inquiries. Rosana Hoes will call CSW back if pt is accepted.  Sharren Bridge, MSW, LCSW Transitions of Care 10/30/2018 [phone]

## 2018-10-30 NOTE — ED Notes (Signed)
IVC paperwork faxed to [facility]; report given to Morton Peters, RN.

## 2018-10-30 NOTE — ED Notes (Signed)
Breakfast Ordered 

## 2018-10-30 NOTE — ED Notes (Signed)
Regular Diet was ordered for Lunch. 

## 2018-10-30 NOTE — ED Notes (Signed)
Pt transported with [facility] to [facility].

## 2018-10-30 NOTE — ED Notes (Signed)
Patient was given Snacks and Drink. 

## 2018-12-30 ENCOUNTER — Inpatient Hospital Stay (HOSPITAL_COMMUNITY)
Admission: AD | Admit: 2018-12-30 | Discharge: 2019-01-01 | Discharge status: Against Medical Advice | Payer: Medicare Other | Attending: Obstetrics and Gynecology | Admitting: Obstetrics and Gynecology

## 2018-12-30 ENCOUNTER — Encounter (HOSPITAL_COMMUNITY): Payer: Self-pay | Admitting: Emergency Medicine

## 2018-12-30 ENCOUNTER — Other Ambulatory Visit: Payer: Self-pay

## 2018-12-30 DIAGNOSIS — Z780 Asymptomatic menopausal state: Secondary | ICD-10-CM | POA: Diagnosis not present

## 2018-12-30 DIAGNOSIS — Z5329 Procedure and treatment not carried out because of patient's decision for other reasons: Secondary | ICD-10-CM | POA: Insufficient documentation

## 2018-12-30 DIAGNOSIS — F200 Paranoid schizophrenia: Secondary | ICD-10-CM

## 2018-12-30 DIAGNOSIS — F209 Schizophrenia, unspecified: Secondary | ICD-10-CM | POA: Diagnosis present

## 2018-12-30 LAB — COMPREHENSIVE METABOLIC PANEL
ALT: 14 U/L (ref 0–44)
AST: 15 U/L (ref 15–41)
Albumin: 3.7 g/dL (ref 3.5–5.0)
Alkaline Phosphatase: 80 U/L (ref 38–126)
Anion gap: 9 (ref 5–15)
BUN: 14 mg/dL (ref 8–23)
CO2: 24 mmol/L (ref 22–32)
Calcium: 9.8 mg/dL (ref 8.9–10.3)
Chloride: 105 mmol/L (ref 98–111)
Creatinine, Ser: 1.22 mg/dL — ABNORMAL HIGH (ref 0.44–1.00)
GFR calc Af Amer: 53 mL/min — ABNORMAL LOW (ref >60)
GFR calc non Af Amer: 46 mL/min — ABNORMAL LOW (ref >60)
Glucose, Bld: 107 mg/dL — ABNORMAL HIGH (ref 70–99)
Potassium: 3.9 mmol/L (ref 3.5–5.1)
Sodium: 138 mmol/L (ref 135–145)
Total Bilirubin: 0.7 mg/dL (ref 0.3–1.2)
Total Protein: 7.7 g/dL (ref 6.5–8.1)

## 2018-12-30 LAB — CBC
HCT: 42.1 % (ref 36.0–46.0)
Hemoglobin: 13.8 g/dL (ref 12.0–15.0)
MCH: 30.5 pg (ref 26.0–34.0)
MCHC: 32.8 g/dL (ref 30.0–36.0)
MCV: 93.1 fL (ref 80.0–100.0)
Platelets: 315 10*3/uL (ref 150–400)
RBC: 4.52 MIL/uL (ref 3.87–5.11)
RDW: 13.4 % (ref 11.5–15.5)
WBC: 6.4 10*3/uL (ref 4.0–10.5)
nRBC: 0 % (ref 0.0–0.2)

## 2018-12-30 LAB — RAPID URINE DRUG SCREEN, HOSP PERFORMED
Amphetamines: NOT DETECTED
Barbiturates: NOT DETECTED
Benzodiazepines: NOT DETECTED
Cocaine: NOT DETECTED
Opiates: NOT DETECTED
Tetrahydrocannabinol: NOT DETECTED

## 2018-12-30 LAB — I-STAT BETA HCG BLOOD, ED (MC, WL, AP ONLY): I-stat hCG, quantitative: <5 m[IU]/mL (ref <5)

## 2018-12-30 LAB — ETHANOL: Alcohol, Ethyl (B): <10 mg/dL (ref <10)

## 2018-12-30 NOTE — MAU Note (Signed)
.   Erin Riley is a 67 y.o. at Unknown here in MAU reporting: that she was sent over here to be delivered due to the baby being small. Complains of lower back pain  Onset of complaint: today Pain score: 10 Vitals:   12/30/18 1621  BP: (!) 164/99  Pulse: 89  Resp: 20  Temp: 98 F (36.7 C)  SpO2: 98%     FHT: Lab orders placed from triage:

## 2018-12-30 NOTE — ED Triage Notes (Signed)
Pt returns to ED for "pregnancy." Pt has hx paranoid schizophrenia.
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
## 2018-12-31 NOTE — ED Notes (Signed)
Pt called for room multiple times, no answer 

## 2019-01-09 ENCOUNTER — Emergency Department (HOSPITAL_COMMUNITY)
Admission: EM | Admit: 2019-01-09 | Discharge: 2019-01-09 | Discharge status: Against Medical Advice | Payer: Medicare Other

## 2019-01-09 ENCOUNTER — Other Ambulatory Visit: Payer: Self-pay

## 2019-01-09 NOTE — ED Notes (Signed)
I called patient name in the lobby to get triage she look and said something threw her hand up however did not come.

## 2019-01-17 ENCOUNTER — Other Ambulatory Visit: Payer: Self-pay

## 2019-01-17 ENCOUNTER — Emergency Department (HOSPITAL_COMMUNITY)
Admission: EM | Admit: 2019-01-17 | Discharge: 2019-01-17 | Disposition: A | Discharge status: Home / Self Care | Payer: Medicare Other | Attending: Emergency Medicine | Admitting: Emergency Medicine

## 2019-01-17 ENCOUNTER — Encounter: Payer: Self-pay | Admitting: *Deleted

## 2019-01-17 ENCOUNTER — Emergency Department (HOSPITAL_COMMUNITY): Payer: Medicare Other

## 2019-01-17 ENCOUNTER — Telehealth: Payer: Self-pay | Admitting: *Deleted

## 2019-01-17 ENCOUNTER — Encounter (HOSPITAL_COMMUNITY): Payer: Self-pay | Admitting: Emergency Medicine

## 2019-01-17 DIAGNOSIS — I129 Hypertensive chronic kidney disease with stage 1 through stage 4 chronic kidney disease, or unspecified chronic kidney disease: Secondary | ICD-10-CM | POA: Insufficient documentation

## 2019-01-17 DIAGNOSIS — R1084 Generalized abdominal pain: Secondary | ICD-10-CM

## 2019-01-17 DIAGNOSIS — N183 Chronic kidney disease, stage 3 unspecified: Secondary | ICD-10-CM | POA: Insufficient documentation

## 2019-01-17 DIAGNOSIS — R079 Chest pain, unspecified: Secondary | ICD-10-CM

## 2019-01-17 DIAGNOSIS — I2510 Atherosclerotic heart disease of native coronary artery without angina pectoris: Secondary | ICD-10-CM | POA: Diagnosis not present

## 2019-01-17 DIAGNOSIS — R0789 Other chest pain: Secondary | ICD-10-CM | POA: Diagnosis not present

## 2019-01-17 DIAGNOSIS — Z79899 Other long term (current) drug therapy: Secondary | ICD-10-CM | POA: Diagnosis not present

## 2019-01-17 DIAGNOSIS — Z87891 Personal history of nicotine dependence: Secondary | ICD-10-CM | POA: Diagnosis not present

## 2019-01-17 DIAGNOSIS — F200 Paranoid schizophrenia: Secondary | ICD-10-CM | POA: Insufficient documentation

## 2019-01-17 LAB — HEPATIC FUNCTION PANEL
ALT: 14 U/L (ref 0–44)
AST: 17 U/L (ref 15–41)
Albumin: 3.5 g/dL (ref 3.5–5.0)
Alkaline Phosphatase: 81 U/L (ref 38–126)
Bilirubin, Direct: 0.1 mg/dL (ref 0.0–0.2)
Indirect Bilirubin: 0.5 mg/dL (ref 0.3–0.9)
Total Bilirubin: 0.6 mg/dL (ref 0.3–1.2)
Total Protein: 7.6 g/dL (ref 6.5–8.1)

## 2019-01-17 LAB — URINALYSIS, ROUTINE W REFLEX MICROSCOPIC
Bilirubin Urine: NEGATIVE
Glucose, UA: NEGATIVE mg/dL
Hgb urine dipstick: NEGATIVE
Ketones, ur: NEGATIVE mg/dL
Nitrite: NEGATIVE
Protein, ur: 100 mg/dL — AB
Specific Gravity, Urine: 1.023 (ref 1.005–1.030)
pH: 5 (ref 5.0–8.0)

## 2019-01-17 LAB — CBC
HCT: 43.3 % (ref 36.0–46.0)
Hemoglobin: 13.8 g/dL (ref 12.0–15.0)
MCH: 29.8 pg (ref 26.0–34.0)
MCHC: 31.9 g/dL (ref 30.0–36.0)
MCV: 93.5 fL (ref 80.0–100.0)
Platelets: 318 10*3/uL (ref 150–400)
RBC: 4.63 MIL/uL (ref 3.87–5.11)
RDW: 13.2 % (ref 11.5–15.5)
WBC: 7 10*3/uL (ref 4.0–10.5)
nRBC: 0 % (ref 0.0–0.2)

## 2019-01-17 LAB — BASIC METABOLIC PANEL
Anion gap: 12 (ref 5–15)
BUN: 16 mg/dL (ref 8–23)
CO2: 24 mmol/L (ref 22–32)
Calcium: 9.6 mg/dL (ref 8.9–10.3)
Chloride: 102 mmol/L (ref 98–111)
Creatinine, Ser: 1.26 mg/dL — ABNORMAL HIGH (ref 0.44–1.00)
GFR calc Af Amer: 51 mL/min — ABNORMAL LOW (ref >60)
GFR calc non Af Amer: 44 mL/min — ABNORMAL LOW (ref >60)
Glucose, Bld: 174 mg/dL — ABNORMAL HIGH (ref 70–99)
Potassium: 3.8 mmol/L (ref 3.5–5.1)
Sodium: 138 mmol/L (ref 135–145)

## 2019-01-17 LAB — LIPASE, BLOOD: Lipase: 37 U/L (ref 11–51)

## 2019-01-17 LAB — TROPONIN I (HIGH SENSITIVITY)
Troponin I (High Sensitivity): 25 ng/L — ABNORMAL HIGH (ref <18)
Troponin I (High Sensitivity): 25 ng/L — ABNORMAL HIGH (ref <18)

## 2019-01-17 LAB — ETHANOL: Alcohol, Ethyl (B): <10 mg/dL (ref <10)

## 2019-01-17 MED ORDER — OLANZAPINE 10 MG PO TABS
10.0000 mg | ORAL_TABLET | Freq: Every day | ORAL | Status: DC
  Filled 2019-01-17: qty 1

## 2019-01-17 MED ORDER — FOSFOMYCIN TROMETHAMINE 3 G PO PACK
3.0000 g | PACK | Freq: Once | ORAL | Status: DC
  Filled 2019-01-17: qty 3

## 2019-01-17 MED ORDER — SODIUM CHLORIDE 0.9% FLUSH: 3.0000 mL | Freq: Once | INTRAVENOUS | Status: DC

## 2019-01-17 MED ORDER — IOHEXOL 300 MG/ML  SOLN
125.0000 mL | Freq: Once | INTRAMUSCULAR | Status: AC | PRN
  Administered 2019-01-17: 19:00:00 125 mL via INTRAVENOUS

## 2019-01-17 MED ORDER — CARVEDILOL 12.5 MG PO TABS: 25.0000 mg | ORAL_TABLET | Freq: Two times a day (BID) | ORAL | Status: DC

## 2019-01-17 MED ORDER — HYDRALAZINE HCL 25 MG PO TABS
100.0000 mg | ORAL_TABLET | Freq: Once | ORAL | Status: AC
  Administered 2019-01-17: 100 mg via ORAL
  Filled 2019-01-17: qty 4

## 2019-01-17 MED ORDER — CEPHALEXIN 500 MG PO CAPS: 500.0000 mg | ORAL_CAPSULE | Freq: Two times a day (BID) | ORAL | 0 refills | Status: DC

## 2019-01-17 NOTE — Discharge Instructions (Addendum)
Take Keflex twice a day for 5 days for bladder infection Return if worsening

## 2019-01-17 NOTE — Telephone Encounter (Signed)
Patient walked into the office-  Patient spoke to front office staff stating she has active chest pain. Patient becoming loud in the office. No  visual symptoms - no shortness of breath, diaphoresis. Per protocol  - patient was informed to go to [facility] ER for evaluation. Patient refused to go unless she had a note to take. Patient made aware  Phone call was placed to ER. Patient states she will not be going anywhere unless she has a note. Note typed and given to patient. Dr Ellyn Hack aware - instruction given for patient to go t ER for evaluation. Patient received a note and exited office.

## 2019-01-17 NOTE — ED Triage Notes (Signed)
Reports central cp described as aching.  Denies n/v but endorses diarrhea.  Seen by pcp today and sent here.
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
## 2019-01-17 NOTE — Progress Notes (Signed)
CSW at bedside to discuss assistance with transportation. Upon entering the room, CSW introduced herself and pt states " I dont need one of those either". Pt again states that she does not need a Education officer, museum and CSW replies that she is only here to assist with transportation. Pt explains that she does not need help and that her car is parked outside. CSW inquired if patient felt well and safe enough to drive herself home. Pt replies yes.   EDP made aware.   CSW signing off.   [place]  Clinical Social Worker  Ph: [phone]

## 2019-01-17 NOTE — ED Notes (Signed)
Pt refused all medications before leaving.

## 2019-01-17 NOTE — Progress Notes (Signed)
CSW consulted for transportation needs. CSW attempting to follow up with patient to assist

## 2019-01-17 NOTE — ED Notes (Signed)
Pt stated desire to leave immediatly. Provider notified. D/c paperwork printed. Went over with pt. Pt stated she would not take anything unless her regular md reviewed. Pt educated about need to take antibiotics. Pt had no other questions at this time.

## 2019-01-19 LAB — URINE CULTURE

## 2019-01-30 ENCOUNTER — Other Ambulatory Visit: Payer: Self-pay

## 2019-01-30 ENCOUNTER — Encounter (HOSPITAL_COMMUNITY): Payer: Self-pay | Admitting: Emergency Medicine

## 2019-01-30 ENCOUNTER — Emergency Department (HOSPITAL_COMMUNITY)
Admission: EM | Admit: 2019-01-30 | Discharge: 2019-01-31 | Disposition: A | Discharge status: Other Acute Inpatient Hospital | Payer: Medicare Other | Attending: Emergency Medicine | Admitting: Emergency Medicine

## 2019-01-30 DIAGNOSIS — Z9119 Patient's noncompliance with other medical treatment and regimen: Secondary | ICD-10-CM | POA: Insufficient documentation

## 2019-01-30 DIAGNOSIS — F209 Schizophrenia, unspecified: Secondary | ICD-10-CM | POA: Diagnosis not present

## 2019-01-30 DIAGNOSIS — Z87891 Personal history of nicotine dependence: Secondary | ICD-10-CM | POA: Insufficient documentation

## 2019-01-30 DIAGNOSIS — F22 Delusional disorders: Secondary | ICD-10-CM

## 2019-01-30 DIAGNOSIS — Z79899 Other long term (current) drug therapy: Secondary | ICD-10-CM | POA: Insufficient documentation

## 2019-01-30 DIAGNOSIS — Z20828 Contact with and (suspected) exposure to other viral communicable diseases: Secondary | ICD-10-CM | POA: Insufficient documentation

## 2019-01-30 DIAGNOSIS — I129 Hypertensive chronic kidney disease with stage 1 through stage 4 chronic kidney disease, or unspecified chronic kidney disease: Secondary | ICD-10-CM | POA: Diagnosis not present

## 2019-01-30 DIAGNOSIS — F200 Paranoid schizophrenia: Secondary | ICD-10-CM | POA: Diagnosis not present

## 2019-01-30 DIAGNOSIS — N183 Chronic kidney disease, stage 3 unspecified: Secondary | ICD-10-CM | POA: Insufficient documentation

## 2019-01-30 LAB — COMPREHENSIVE METABOLIC PANEL
ALT: 15 U/L (ref 0–44)
AST: 14 U/L — ABNORMAL LOW (ref 15–41)
Albumin: 3.7 g/dL (ref 3.5–5.0)
Alkaline Phosphatase: 91 U/L (ref 38–126)
Anion gap: 8 (ref 5–15)
BUN: 18 mg/dL (ref 8–23)
CO2: 26 mmol/L (ref 22–32)
Calcium: 9.5 mg/dL (ref 8.9–10.3)
Chloride: 103 mmol/L (ref 98–111)
Creatinine, Ser: 1.46 mg/dL — ABNORMAL HIGH (ref 0.44–1.00)
GFR calc Af Amer: 43 mL/min — ABNORMAL LOW (ref >60)
GFR calc non Af Amer: 37 mL/min — ABNORMAL LOW (ref >60)
Glucose, Bld: 124 mg/dL — ABNORMAL HIGH (ref 70–99)
Potassium: 3.6 mmol/L (ref 3.5–5.1)
Sodium: 137 mmol/L (ref 135–145)
Total Bilirubin: 0.7 mg/dL (ref 0.3–1.2)
Total Protein: 8 g/dL (ref 6.5–8.1)

## 2019-01-30 LAB — ETHANOL: Alcohol, Ethyl (B): <10 mg/dL (ref <10)

## 2019-01-30 LAB — CBC
HCT: 44.9 % (ref 36.0–46.0)
Hemoglobin: 14.6 g/dL (ref 12.0–15.0)
MCH: 30.2 pg (ref 26.0–34.0)
MCHC: 32.5 g/dL (ref 30.0–36.0)
MCV: 93 fL (ref 80.0–100.0)
Platelets: 352 10*3/uL (ref 150–400)
RBC: 4.83 MIL/uL (ref 3.87–5.11)
RDW: 13.3 % (ref 11.5–15.5)
WBC: 8.6 10*3/uL (ref 4.0–10.5)
nRBC: 0 % (ref 0.0–0.2)

## 2019-01-30 LAB — SALICYLATE LEVEL: Salicylate Lvl: <7 mg/dL (ref 2.8–30.0)

## 2019-01-30 LAB — ACETAMINOPHEN LEVEL: Acetaminophen (Tylenol), Serum: <10 ug/mL — ABNORMAL LOW (ref 10–30)
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
## 2019-01-31 ENCOUNTER — Encounter: Payer: Self-pay | Admitting: Emergency Medicine

## 2019-01-31 ENCOUNTER — Inpatient Hospital Stay
Admission: RE | Admit: 2019-01-31 | Discharge: 2019-02-05 | DRG: 885 | Disposition: A | Discharge status: Home / Self Care | Payer: Medicare Other | Source: Intra-hospital | Attending: Psychiatry | Admitting: Psychiatry

## 2019-01-31 DIAGNOSIS — E669 Obesity, unspecified: Secondary | ICD-10-CM | POA: Diagnosis present

## 2019-01-31 DIAGNOSIS — Z6830 Body mass index (BMI) 30.0-30.9, adult: Secondary | ICD-10-CM | POA: Diagnosis not present

## 2019-01-31 DIAGNOSIS — I422 Other hypertrophic cardiomyopathy: Secondary | ICD-10-CM

## 2019-01-31 DIAGNOSIS — F200 Paranoid schizophrenia: Secondary | ICD-10-CM | POA: Diagnosis not present

## 2019-01-31 DIAGNOSIS — I119 Hypertensive heart disease without heart failure: Secondary | ICD-10-CM

## 2019-01-31 DIAGNOSIS — I420 Dilated cardiomyopathy: Secondary | ICD-10-CM | POA: Diagnosis present

## 2019-01-31 DIAGNOSIS — Z9119 Patient's noncompliance with other medical treatment and regimen: Secondary | ICD-10-CM

## 2019-01-31 DIAGNOSIS — Z87891 Personal history of nicotine dependence: Secondary | ICD-10-CM | POA: Diagnosis not present

## 2019-01-31 DIAGNOSIS — Z20828 Contact with and (suspected) exposure to other viral communicable diseases: Secondary | ICD-10-CM | POA: Diagnosis present

## 2019-01-31 DIAGNOSIS — I2510 Atherosclerotic heart disease of native coronary artery without angina pectoris: Secondary | ICD-10-CM | POA: Diagnosis present

## 2019-01-31 DIAGNOSIS — I10 Essential (primary) hypertension: Secondary | ICD-10-CM | POA: Diagnosis present

## 2019-01-31 DIAGNOSIS — N182 Chronic kidney disease, stage 2 (mild): Secondary | ICD-10-CM | POA: Diagnosis present

## 2019-01-31 DIAGNOSIS — F203 Undifferentiated schizophrenia: Secondary | ICD-10-CM | POA: Diagnosis not present

## 2019-01-31 DIAGNOSIS — F209 Schizophrenia, unspecified: Secondary | ICD-10-CM | POA: Diagnosis present

## 2019-01-31 DIAGNOSIS — K219 Gastro-esophageal reflux disease without esophagitis: Secondary | ICD-10-CM | POA: Diagnosis present

## 2019-01-31 DIAGNOSIS — F29 Unspecified psychosis not due to a substance or known physiological condition: Secondary | ICD-10-CM | POA: Diagnosis present

## 2019-01-31 LAB — RAPID URINE DRUG SCREEN, HOSP PERFORMED
Amphetamines: NOT DETECTED
Barbiturates: NOT DETECTED
Benzodiazepines: NOT DETECTED
Cocaine: NOT DETECTED
Opiates: NOT DETECTED
Tetrahydrocannabinol: NOT DETECTED

## 2019-01-31 LAB — POC SARS CORONAVIRUS 2 AG -  ED: SARS Coronavirus 2 Ag: NEGATIVE

## 2019-01-31 LAB — SARS CORONAVIRUS 2 BY RT PCR (HOSPITAL ORDER, PERFORMED IN ~~LOC~~ HOSPITAL LAB): SARS Coronavirus 2: NEGATIVE

## 2019-01-31 MED ORDER — ALUM & MAG HYDROXIDE-SIMETH 200-200-20 MG/5ML PO SUSP: 30.0000 mL | ORAL | Status: DC | PRN

## 2019-01-31 MED ORDER — ACETAMINOPHEN 325 MG PO TABS: 650.0000 mg | ORAL_TABLET | Freq: Four times a day (QID) | ORAL | Status: DC | PRN

## 2019-01-31 MED ORDER — HYDRALAZINE HCL 25 MG PO TABS
100.0000 mg | ORAL_TABLET | Freq: Once | ORAL | Status: AC
  Administered 2019-01-31: 100 mg via ORAL
  Filled 2019-01-31: qty 4

## 2019-01-31 MED ORDER — MAGNESIUM HYDROXIDE 400 MG/5ML PO SUSP: 30.0000 mL | Freq: Every day | ORAL | Status: DC | PRN

## 2019-01-31 NOTE — ED Notes (Signed)
[facility] department *5
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
## 2019-01-31 NOTE — ED Notes (Signed)
[place] department *2

## 2019-01-31 NOTE — ED Notes (Signed)
Ordered bfast 

## 2019-01-31 NOTE — ED Notes (Signed)
Patient taken to [facility] with Sheriffs at this time.

## 2019-01-31 NOTE — ED Notes (Signed)
[facility] department transport *3
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
## 2019-01-31 NOTE — BH Assessment (Signed)
Clinician contacted pt's nurse, Richardson Landry RN, and requested he ask pt for permission to contact a family member for collateral. Richardson Landry did so and clinician could hear pt's response in the background, which included many statements in regards to her disapproval of staff talking to her family. Pt stated, among other things, "I don't need my children [involved]; I don't want them to [facility] to my family]."  Due to these statements, clinician will honor pt's confidentiality and will not contact her family for collateral.

## 2019-01-31 NOTE — ED Notes (Signed)
Patient becoming agitated while waiting for reassessment.  Patient to be moved to purple zone after 7a.  Patient speaking to anyone passing by, getting loud and mildly agitated, wanting to leave.  Patient redirected multiple times with no success. Security called to stand by with patient until sitter arrives at [place].

## 2019-01-31 NOTE — Consult Note (Addendum)
  Patient seen via tele-psych this morning. The patient was too agitated to engage in the assessment. When writer asked the reason why patient had come to the ED she stated "To get some rest." When the reason was mentioned from the nursing notes was mentioned to patient regarding her coming to find a lost baby, the patient immediately became agitated waving her hand towards the camera and turning her body away from writer's view. She then stated "I'm not answering any more questions about that. You will have to speak to Jaquelyn Bitter." Patient refused to participate in the assessment further. From review of notes the patient has a long history of paranoid schizophrenia. Has also presented to the [facility] on 12/30/2018 for concerns about a pregnancy. Patient due to recent psychosis and aggressiveness meets criteria for inpatient psychiatric admission. TTS staff informed of disposition.   Attest to NP Note

## 2019-01-31 NOTE — ED Notes (Signed)
[facility] department transport *4
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
## 2019-01-31 NOTE — BH Assessment (Signed)
Patient has been accepted to [facility].  Accepting physician is Dr. Mallie Darting.  Attending Physician will be Dr. Mallie Darting.  Patient has been assigned to room 316, by [place].  Call report to [phone].  Representative/Transfer Coordinator is [employer] Patient pre-admitted by [facility] Patient Access Elberta Fortis)  [facility] Staff Marcello Moores H, TTS) made aware of acceptance.

## 2019-01-31 NOTE — ED Notes (Signed)
Report given to [facility] RN
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
## 2019-01-31 NOTE — ED Notes (Signed)
Patient given lunch tray.

## 2019-02-01 DIAGNOSIS — F200 Paranoid schizophrenia: Secondary | ICD-10-CM

## 2019-02-01 MED ORDER — OLANZAPINE 10 MG PO TABS
10.0000 mg | ORAL_TABLET | Freq: Every day | ORAL | Status: DC
  Administered 2019-02-02: 10 mg via ORAL
  Filled 2019-02-01 (×2): qty 1

## 2019-02-01 MED ORDER — HYDRALAZINE HCL 50 MG PO TABS
100.0000 mg | ORAL_TABLET | Freq: Three times a day (TID) | ORAL | Status: DC
  Administered 2019-02-02: 100 mg via ORAL
  Filled 2019-02-01 (×5): qty 2

## 2019-02-01 MED ORDER — CARVEDILOL 12.5 MG PO TABS
25.0000 mg | ORAL_TABLET | Freq: Two times a day (BID) | ORAL | Status: DC
  Administered 2019-02-02 – 2019-02-05 (×6): 25 mg via ORAL
  Filled 2019-02-01 (×6): qty 2

## 2019-02-01 MED ORDER — FAMOTIDINE 20 MG PO TABS
20.0000 mg | ORAL_TABLET | Freq: Every day | ORAL | Status: DC
  Administered 2019-02-02 – 2019-02-04 (×3): 20 mg via ORAL
  Filled 2019-02-01 (×4): qty 1

## 2019-02-01 MED ORDER — CLONIDINE HCL 0.1 MG PO TABS
0.1000 mg | ORAL_TABLET | Freq: Once | ORAL | Status: AC
  Administered 2019-02-01: 0.1 mg via ORAL
  Filled 2019-02-01: qty 1

## 2019-02-01 MED ORDER — ISOSORBIDE MONONITRATE ER 30 MG PO TB24
30.0000 mg | ORAL_TABLET | Freq: Every day | ORAL | Status: DC
  Administered 2019-02-03 – 2019-02-05 (×3): 30 mg via ORAL
  Filled 2019-02-01 (×5): qty 1

## 2019-02-01 MED ORDER — OLANZAPINE 5 MG PO TABS
5.0000 mg | ORAL_TABLET | Freq: Three times a day (TID) | ORAL | Status: DC | PRN
  Administered 2019-02-03: 5 mg via ORAL
  Filled 2019-02-01: qty 1

## 2019-02-01 MED ORDER — OLANZAPINE 10 MG IM SOLR: 5.0000 mg | Freq: Three times a day (TID) | INTRAMUSCULAR | Status: DC | PRN
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
## 2019-02-01 NOTE — H&P (Signed)
Psychiatric Admission Assessment Adult  Patient Identification: Erin Riley MRN:  [id number] Date of Evaluation:  02/01/2019 Chief Complaint:  schizophrenia Principal Diagnosis: <principal problem not specified> Diagnosis:  Active Problems:   Schizophrenia (HCC)  Erin Riley is a 68yo F with past psych history of Schizoplhrenia, who was admitted to inpatient [facility] unit with worsened psychosis in settings of possible medication non-compliance.  History of Present Illness:  On interview today patient appears agitated and unable to participate in meaningful interview. She yells "I wanna go home! I don`t want to talk to you! I need to get my money! Make sure they have my money!" and repeats the above over and over again. The history obtained per chart review. Per Psych Counselor`s note from 01/30/09: "Erin Riley is a 68 y.o. female who came to [name] ED because she states she was told by [employer] that she needed to have blood work done to confirm her identity as the mother of a baby that was stolen while in utero. Pt shared with clinician additional information that clinician could not understand, including information that identified that Oprah was involved and multiple other people that clinician did not recognize the names of. Pt refused to participate in the remainder of the assessment, stating she did not come to the hospital for counseling. Clinician requested to ensure the basics of information from pt, and pt did verify that she has not been experiencing SI, HI, AVH, engaging in the use of substances, or harming herself."  Chart review reveals multiple Er visits for paranoid delusions.  Past Psychiatric History: Per Dr. Toni Amend evaluation on 09/13/18: "Patient has been admitted to psychiatric hospital several times previously.  [facility].  Eventually stabilized on Zyprexa although it sounds like even at her best her baseline continued to be somewhat  disorganized.  Since then she has been to the emergency room several times usually for medical complaints and on at least a couple of those has been referred to psychiatric hospitals.  No known history of suicide attempts or violence.  Looks like Zyprexa has been used on several occasions with some benefit.  Patient not willing to discuss any outpatient treatment not clear whether she goes for any."  Patient`s home medications per ED note from 01/30/2019:      Is the patient at risk to self? No.  Has the patient been a risk to self in the past 6 months? No.  Has the patient been a risk to self within the distant past? No.  Is the patient a risk to others? No.  Has the patient been a risk to others in the past 6 months? No.  Has the patient been a risk to others within the distant past? No.   Prior Inpatient Therapy:   Prior Outpatient Therapy:    Alcohol Screening: 1. How often do you have a drink containing alcohol?: Never 2. How many drinks containing alcohol do you have on a typical day when you are drinking?: 1 or 2 3. How often do you have six or more drinks on one occasion?: Never AUDIT-C Score: 0 4. How often during the last year have you found that you were not able to stop drinking once you had started?: Never 5. How often during the last year have you failed to do what was normally expected from you becasue of drinking?: Never 6. How often during the last year have you needed a first drink in the morning  to get yourself going after a heavy drinking session?: Never 7. How often during the last year have you had a feeling of guilt of remorse after drinking?: Never 8. How often during the last year have you been unable to remember what happened the night before because you had been drinking?: Never 9. Have you or someone else been injured as a result of your drinking?: No 10. Has a relative or friend or a doctor or another health worker been concerned about your drinking or  suggested you cut down?: No Alcohol Use Disorder Identification Test Final Score (AUDIT): 0 Alcohol Brief Interventions/Follow-up: AUDIT Score <7 follow-up not indicated Substance Abuse History in the last 12 months:  No. Consequences of Substance Abuse: NA Previous Psychotropic Medications: Yes  Psychological Evaluations: Yes  Past Medical History:  Past Medical History:  Diagnosis Date  . Chronic back pain   . Chronic kidney disease (CKD), stage II (mild)    Class I-II  . Coronary artery disease 04/2009   50% stenosis in the perforator of LAD; catheterization was for an abnormal Myoview in January 2000 showing anterior and inferolateral ischemia.  . Diverticulitis   . 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 > 3 subsequent Echos show improved/normal EF with moderate to severe concentric LVH and diastolic dysfunction with LVOT/intracavitary gradient --> 06/2016: Severe LVH.  Vigorous EF, 65-70%.?? Gr 1 DD. Mild AS.  [name] Hyperlipidemia   . Hypertension   . Hypertensive hypertrophic cardiomyopathy: NYHA class II:  Echo: Severe concentric LVH with LV OT gradient; essentially preserved EF with diastolic dysfunction 54/65/6812   Echo 06/2016: Severe Concentric LVH. Vigorous EF 65-70%. ~ Gr I DD.   . Mild aortic stenosis by prior echocardiography    Echo 06/2016: Mild AS (Mean Gradient 15 mmHg); has had prior Mod-Severe MR (not seen on current echo)  . PAD (peripheral artery disease) [facility]) March 2013   Lower extremity Dopplers: R. SFA 50-60%, R. PTA proximally occluded with distal reconstitution;; L. common iliac ~50%, L. SFA 50-70% stenosis, L. PTA < 50%  . Schizophrenia [facility])     Past Surgical History:  Procedure Laterality Date  . BUNIONECTOMY    . carotid doppler  05/29/2011   left bulb/prox ICA moderate amtfibrous plaque with no evidence significant reduction.,right bulb /proximal ICA normal  patency  . lower extremity doppler  05/29/2011   right SFA 50% to 59% diameter reduction,right posterior tibal atreery occlusive disease,reconstituting distally, left common illiac<50%,left SFA 50 to70%,left post. tibial <50%  . NM MYOCAR PERF WALL MOTION  03/2009   Persantine; EF 51%-both anterior and inferolateral ischemia  . TRANSTHORACIC ECHOCARDIOGRAM  06/2016   Severe LVH.  Vigorous EF of 65-70%.  No RWMA. ~Only grade 1 diastolic dysfunction.  Mild aortic stenosis (mean gradient 15 mmHg)  . TRANSTHORACIC ECHOCARDIOGRAM  07/2012   EF 50-55%; severe concentric LVH; only grade 1 diastolic dysfunction. Mild aortic sclerosis - with LVOT /intracavitary gradient of roughly 20 mmHg mean. Mild to moderately dilated LA;; previously reported MR not seen   Family History:  Family History  Problem Relation Age of Onset  . Hypertension Mother   . Breast cancer Neg Hx    Family Psychiatric  History: unknown Tobacco Screening:   Social History:  Social History   Substance and Sexual Activity  Alcohol Use No     Social History   Substance and Sexual Activity  Drug Use No  Additional Social History:                           Allergies:  No Known Allergies Lab Results:  Results for orders placed or performed during the hospital encounter of 01/30/19 (from the past 48 hour(s))  Comprehensive metabolic panel     Status: Abnormal   Collection Time: 01/30/19  9:30 PM  Result Value Ref Range   Sodium 137 135 - 145 mmol/L   Potassium 3.6 3.5 - 5.1 mmol/L   Chloride 103 98 - 111 mmol/L   CO2 26 22 - 32 mmol/L   Glucose, Bld 124 (H) 70 - 99 mg/dL   BUN 18 8 - 23 mg/dL   Creatinine, Ser 1.61 (H) 0.44 - 1.00 mg/dL   Calcium 9.5 8.9 - 09.6 mg/dL   Total Protein 8.0 6.5 - 8.1 g/dL   Albumin 3.7 3.5 - 5.0 g/dL   AST 14 (L) 15 - 41 U/L   ALT 15 0 - 44 U/L   Alkaline Phosphatase 91 38 - 126 U/L   Total Bilirubin 0.7 0.3 - 1.2 mg/dL   GFR calc non Af Amer 37 (L) >60 mL/min   GFR  calc Af Amer 43 (L) >60 mL/min   Anion gap 8 5 - 15    Comment: Performed at [facility], [address], [place], [place] [address]  Ethanol     Status: None   Collection Time: 01/30/19  9:30 PM  Result Value Ref Range   Alcohol, Ethyl (B) <10 <10 mg/dL    Comment: (NOTE) Lowest detectable limit for serum alcohol is 10 mg/dL. For medical purposes only. Performed at [facility], [address] [place], [place] [address]   Salicylate level     Status: None   Collection Time: 01/30/19  9:30 PM  Result Value Ref Range   Salicylate Lvl <7.0 2.8 - 30.0 mg/dL    Comment: Performed at [facility], [address] [place], [place] [address]  Acetaminophen level     Status: Abnormal   Collection Time: 01/30/19  9:30 PM  Result Value Ref Range   Acetaminophen (Tylenol), Serum <10 (L) 10 - 30 ug/mL    Comment: (NOTE) Therapeutic concentrations vary significantly. A range of 10-30 ug/mL  may be an effective concentration for many patients. However, some  are best treated at concentrations outside of this range. Acetaminophen concentrations >150 ug/mL at 4 hours after ingestion  and >50 ug/mL at 12 hours after ingestion are often associated with  toxic reactions. Performed at [facility], [address], [place], [place] [address]   cbc     Status: None   Collection Time: 01/30/19  9:30 PM  Result Value Ref Range   WBC 8.6 4.0 - 10.5 K/uL   RBC 4.83 3.87 - 5.11 MIL/uL   Hemoglobin 14.6 12.0 - 15.0 g/dL   HCT 30.8 65.7 - 84.6 %   MCV 93.0 80.0 - 100.0 fL   MCH 30.2 26.0 - 34.0 pg   MCHC 32.5 30.0 - 36.0 g/dL   RDW 96.2 95.2 - 84.1 %   Platelets 352 150 - 400 K/uL   nRBC 0.0 0.0 - 0.2 %    Comment: Performed at [facility], [address], [place], [place] [address]  POC SARS Coronavirus 2 Ag-ED -     Status: None   Collection Time: 01/31/19  2:59 AM  Result Value Ref Range   SARS Coronavirus 2 Ag NEGATIVE NEGATIVE    Comment: (NOTE) SARS-CoV-2  antigen NOT DETECTED.  Negative results are presumptive.  Negative results do not preclude SARS-CoV-2 infection and should not be used as the sole basis for treatment or other patient management decisions, including infection  control decisions, particularly in the presence of clinical signs and  symptoms consistent with COVID-19, or in those who have been in contact with the virus.  Negative results must be combined with clinical observations, patient history, and epidemiological information. The expected result is Negative. Fact Sheet for Patients: https://sanders-williams.net/ Fact Sheet for Healthcare Providers: https://martinez.com/ This test is not yet approved or cleared by the [place] FDA and  has been authorized for detection and/or diagnosis of SARS-CoV-2 by FDA under an Emergency Use Authorization (EUA).  This EUA will remain in effect (meaning this test can be used) for the duration of  the COVID-19 de claration under Section 564(b)(1) of the Act, 21 U.S.C. section 360bbb-3(b)(1), unless the authorization is terminated or revoked sooner.   SARS Coronavirus 2 by RT PCR (hospital order, performed in [facility] hospital lab) Nasopharyngeal Nasopharyngeal Swab     Status: None   Collection Time: 01/31/19 12:28 PM   Specimen: Nasopharyngeal Swab  Result Value Ref Range   SARS Coronavirus 2 NEGATIVE NEGATIVE    Comment: (NOTE) SARS-CoV-2 target nucleic acids are NOT DETECTED. The SARS-CoV-2 RNA is generally detectable in upper and lower respiratory specimens during the acute phase of infection. The lowest concentration of SARS-CoV-2 viral copies this assay can detect is 250 copies / mL. A negative result does not preclude SARS-CoV-2 infection and should not be used as the sole basis for treatment or other patient management decisions.  A negative result may occur with improper specimen collection / handling, submission of specimen  other than nasopharyngeal swab, presence of viral mutation(s) within the areas targeted by this assay, and inadequate number of viral copies (<250 copies / mL). A negative result must be combined with clinical observations, patient history, and epidemiological information. Fact Sheet for Patients:   BoilerBrush.com.cy Fact Sheet for Healthcare Providers: https://pope.com/ This test is not yet approved or cleared  by the [place] FDA and has been authorized for detection and/or diagnosis of SARS-CoV-2 by FDA under an Emergency Use Authorization (EUA).  This EUA will remain in effect (meaning this test can be used) for the duration of the COVID-19 declaration under Section 564(b)(1) of the Act, 21 U.S.C. section 360bbb-3(b)(1), unless the authorization is terminated or revoked sooner. Performed at [facility], [address], [place], [place] [address]   Rapid urine drug screen (hospital performed)     Status: None   Collection Time: 01/31/19  3:00 PM  Result Value Ref Range   Opiates NONE DETECTED NONE DETECTED   Cocaine NONE DETECTED NONE DETECTED   Benzodiazepines NONE DETECTED NONE DETECTED   Amphetamines NONE DETECTED NONE DETECTED   Tetrahydrocannabinol NONE DETECTED NONE DETECTED   Barbiturates NONE DETECTED NONE DETECTED    Comment: (NOTE) DRUG SCREEN FOR MEDICAL PURPOSES ONLY.  IF CONFIRMATION IS NEEDED FOR ANY PURPOSE, NOTIFY LAB WITHIN 5 DAYS. LOWEST DETECTABLE LIMITS FOR URINE DRUG SCREEN Drug Class                     Cutoff (ng/mL) Amphetamine and metabolites    1000 Barbiturate and metabolites    200 Benzodiazepine  200 Tricyclics and metabolites     300 Opiates and metabolites        300 Cocaine and metabolites        300 THC                            50 Performed at [facility], [address], [place], [place] [address]     Blood Alcohol level:  Lab Results  Component  Value Date   [facility] <10 01/30/2019   ETH <10 01/17/2019    Metabolic Disorder Labs:  Lab Results  Component Value Date   HGBA1C 5.9 (H) 06/09/2016   MPG 123 06/09/2016   MPG 105 07/31/2012   Lab Results  Component Value Date   PROLACTIN 16.5 06/09/2016   Lab Results  Component Value Date   CHOL 123 08/31/2017   TRIG 120 08/31/2017   HDL 40 08/31/2017   CHOLHDL 3.1 08/31/2017   VLDL 55 (H) 06/09/2016   LDLCALC 59 08/31/2017   LDLCALC 80 06/09/2016    Current Medications: Current Facility-Administered Medications  Medication Dose Route Frequency Provider Last Rate Last Dose  . acetaminophen (TYLENOL) tablet 650 mg  650 mg Oral Q6H PRN Antonieta Pert, MD      . alum & mag hydroxide-simeth (MAALOX/MYLANTA) 200-200-20 MG/5ML suspension 30 mL  30 mL Oral Q4H PRN Antonieta Pert, MD      . magnesium hydroxide (MILK OF MAGNESIA) suspension 30 mL  30 mL Oral Daily PRN Antonieta Pert, MD      . OLANZapine [facility]) tablet 10 mg  10 mg Oral QHS Thalia Party, MD       PTA Medications: Medications Prior to Admission  Medication Sig Dispense Refill Last Dose  . carvedilol (COREG) 25 MG tablet Take 2 tablets (50 mg total) by mouth 2 (two) times daily with a meal. (Patient taking differently: Take 25 mg by mouth 2 (two) times daily with a meal. ) 30 tablet 0   . cephALEXin (KEFLEX) 500 MG capsule Take 1 capsule (500 mg total) by mouth 2 (two) times daily. 10 capsule 0   . famotidine (PEPCID) 20 MG tablet Take 1 tablet (20 mg total) by mouth at bedtime. 30 tablet 0   . hydrALAZINE (APRESOLINE) 100 MG tablet Take 1 tablet (100 mg total) by mouth every 8 (eight) hours. 60 tablet 0   . isosorbide mononitrate (IMDUR) 30 MG 24 hr tablet Take 3 tablets (90 mg total) by mouth daily. 90 tablet 0   . OLANZapine (ZYPREXA) 10 MG tablet Take 1 tablet (10 mg total) by mouth at bedtime. (Patient not taking: Reported on 10/01/2018) 30 tablet 0   . OLANZapine (ZYPREXA) 5 MG tablet Take 1 tablet (5  mg total) by mouth daily. (Patient not taking: Reported on 10/01/2018) 30 tablet 0     Musculoskeletal: Strength & Muscle Tone: within normal limits Gait & Station: normal Patient leans: N/A  Psychiatric Specialty Exam: Physical Exam  Constitutional: She is oriented to person, place, and time. She appears well-developed and well-nourished.  HENT:  Head: Normocephalic and atraumatic.  Eyes: Pupils are equal, round, and reactive to light. EOM are normal.  Neck: Neck supple.  Cardiovascular: Normal rate and regular rhythm.  Respiratory: Effort normal and breath sounds normal.  GI: Soft. Bowel sounds are normal.  Musculoskeletal: Normal range of motion.  Neurological: She is alert and oriented to person, place, and time.  Skin: Skin is  warm and dry.    Review of Systems  Constitutional: Negative for chills, fever and malaise/fatigue.  HENT: Negative for ear pain and hearing loss.   Eyes: Negative for blurred vision and double vision.  Respiratory: Negative for cough and shortness of breath.   Cardiovascular: Negative for chest pain and palpitations.  Gastrointestinal: Negative for nausea and vomiting.  Genitourinary: Negative for urgency.  Skin: Negative for itching and rash.  Neurological: Negative for dizziness and headaches.  Psychiatric/Behavioral: Positive for hallucinations.    Blood pressure 140/85, pulse 79, temperature 98.7 F (37.1 C), temperature source Oral, resp. rate 18, height 5\' 7"  (1.702 m), weight 116.1 kg, last menstrual period 05/10/2013, SpO2 98 %.Body mass index is 40.1 kg/m.  General Appearance: Disheveled  Eye Contact:  Fair  Speech:  Pressured  Volume:  Increased  Mood:  Dysphoric  Affect:  Congruent  Thought Process:  Disorganized and Irrelevant  Orientation:  Full (Time, Place, and Person)  Thought Content:  Illogical, Delusions, Obsessions, Paranoid Ideation and Rumination  Suicidal Thoughts:  No  Homicidal Thoughts:  No  Memory:  Immediate;    Fair Recent;   Fair  Judgement:  Impaired  Insight:  Lacking  Psychomotor Activity:  Increased  Concentration:  Concentration: Fair and Attention Span: Fair  Recall:  [employer]  Fund of Knowledge:  Fair  Language:  Good  Akathisia:  No  Handed:  Right  AIMS (if indicated):     Assets:  Social Support  ADL's:  Intact  Cognition:  Impaired,  Mild  Sleep:  Number of Hours: 7.75    Treatment Plan Summary: Daily contact with patient to assess and evaluate symptoms and progress in treatment  Observation Level/Precautions:  15 minute checks  Laboratory:  Chemistry Profile HbAIC  Psychotherapy:    Medications:    Consultations:    Discharge Concerns:    Estimated LOS:  Other:     Physician Treatment Plan for Primary Diagnosis: <principal problem not specified> Long Term Goal(s): Improvement in symptoms so as ready for discharge  Short Term Goals: Ability to identify changes in lifestyle to reduce recurrence of condition will improve, Ability to verbalize feelings will improve, Ability to disclose and discuss suicidal ideas, Ability to demonstrate self-control will improve, Ability to identify and develop effective coping behaviors will improve, Ability to maintain clinical measurements within normal limits will improve, Compliance with prescribed medications will improve and Ability to identify triggers associated with substance abuse/mental health issues will improve  Physician Treatment Plan for Secondary Diagnosis: Active Problems:   Schizophrenia (HCC)  Long Term Goal(s): Improvement in symptoms so as ready for discharge  Short Term Goals: Ability to identify changes in lifestyle to reduce recurrence of condition will improve, Ability to verbalize feelings will improve, Ability to disclose and discuss suicidal ideas, Ability to demonstrate self-control will improve, Ability to identify and develop effective coping behaviors will improve, Ability to maintain clinical measurements  within normal limits will improve, Compliance with prescribed medications will improve and Ability to identify triggers associated with substance abuse/mental health issues will improve  I certify that inpatient services furnished can reasonably be expected to improve the patient's condition.     Assessment/Plan: [name] is a 68yo F with past psych history of Schizoplhrenia, who [facility] with worsened psychosis in settings of possible medication non-compliance. On interview today patient is obviously psychotic and not able to participate in the conversation. Inpatient psych admission will be continued. Patient will be introduced to  milieu activities.  Her Zyprexa will be restarted as it was shown to be effective in the past. Will also restart Coreg, Pepcid, Apresoline, Imdur for HTN and cardiomyopathy. Will obtain EKG and re-check CMP (elevated creatinine). We will try to work on disposition as soon as possible  Thalia Party, MD 11/28/20205:56 PM

## 2019-02-01 NOTE — Plan of Care (Signed)
  Problem: Education: Goal: Knowledge of [facility] Education information/materials will improve Outcome: Progressing Goal: Emotional status will improve Outcome: Progressing   Problem: Education: Goal: Mental status will improve Outcome: Not Progressing   Problem: Activity: Goal: Interest or engagement in activities will improve Outcome: Not Progressing   Problem: Coping: Goal: Ability to verbalize frustrations and anger appropriately will improve Outcome: Not Progressing

## 2019-02-01 NOTE — Progress Notes (Signed)
Patient presents with flat affect. Denies SI, HI, AVH. Isolative to self. Guarded. Forwards minimal. Medication compliant. Patient with high BP reading. Medication given with good relief. Encouragement and support offered. Safety checks maintained. Pt receptive and remains safe on unit with q 15 min checks.

## 2019-02-01 NOTE — Progress Notes (Signed)
Patient becoming agitated this afternoon stating the "President told her to stay in her house." Patient not easily redirectable. States staff is setting her up, she dont need to be here she has been here before. Patient angered when nurse attempts to re-direct. Patient currently in dayroom.

## 2019-02-01 NOTE — [facility] Group Notes (Signed)
CSW did not have group today on Overcoming Obstacles due to patient's have recreational time and unit acuity.    Benito Lemmerman, MSW, LCSW Clinical Social Worker II   [facility] Phone: [phone] Fax: [phone]  

## 2019-02-02 LAB — COMPREHENSIVE METABOLIC PANEL
ALT: 12 U/L (ref 0–44)
AST: 16 U/L (ref 15–41)
Albumin: 3.5 g/dL (ref 3.5–5.0)
Alkaline Phosphatase: 81 U/L (ref 38–126)
Anion gap: 8 (ref 5–15)
BUN: 20 mg/dL (ref 8–23)
CO2: 25 mmol/L (ref 22–32)
Calcium: 9.6 mg/dL (ref 8.9–10.3)
Chloride: 104 mmol/L (ref 98–111)
Creatinine, Ser: 1.15 mg/dL — ABNORMAL HIGH (ref 0.44–1.00)
GFR calc Af Amer: 57 mL/min — ABNORMAL LOW (ref >60)
GFR calc non Af Amer: 49 mL/min — ABNORMAL LOW (ref >60)
Glucose, Bld: 101 mg/dL — ABNORMAL HIGH (ref 70–99)
Potassium: 4.3 mmol/L (ref 3.5–5.1)
Sodium: 137 mmol/L (ref 135–145)
Total Bilirubin: 0.5 mg/dL (ref 0.3–1.2)
Total Protein: 7.4 g/dL (ref 6.5–8.1)

## 2019-02-02 MED ORDER — HYDRALAZINE HCL 50 MG PO TABS
50.0000 mg | ORAL_TABLET | Freq: Three times a day (TID) | ORAL | Status: DC
  Administered 2019-02-02 – 2019-02-05 (×8): 50 mg via ORAL
  Filled 2019-02-02 (×11): qty 1

## 2019-02-02 NOTE — Plan of Care (Signed)
  Problem: Education: Goal: Knowledge of [facility] Education information/materials will improve Outcome: Not Progressing Goal: Emotional status will improve Outcome: Not Progressing Goal: Mental status will improve Outcome: Not Progressing Goal: Verbalization of understanding the information provided will improve Outcome: Not Progressing  D: Patient did take all of her medications tonight at hs, but blood pressure continues to be elevated. When I saw her at beginning of shift and asked if I could do anything for her, she said "will you bring me my medication so I can take it so I can go home?". Patient is pleasant and cooperative, but still alert and oriented x1. Sitting in dayroom, watching TV with peers. Minimal interaction with staff and peers. Not voicing any suicidal or homicidal thoughts, or any delusional content, but continues to be internally preoccupied. A: Continue to monitor for safety. R: Safety maintained.

## 2019-02-02 NOTE — [facility] Group Notes (Signed)
[facility] LCSW Group Therapy Note  Date/Time: 02/02/2019 @ 1:30pm  Type of Therapy and Topic:  Group Therapy:  Overcoming Obstacles  Participation Level:  [facility] PARTICIPATION LEVEL: Did Not Attend  Description of Group:    In this group patients will be encouraged to explore what they see as obstacles to their own wellness and recovery. They will be guided to discuss their thoughts, feelings, and behaviors related to these obstacles. The group will process together ways to cope with barriers, with attention given to specific choices patients can make. Each patient will be challenged to identify changes they are motivated to make in order to overcome their obstacles. This group will be process-oriented, with patients participating in exploration of their own experiences as well as giving and receiving support and challenge from other group members.  Therapeutic Goals: 1. Patient will identify personal and current obstacles as they relate to admission. 2. Patient will identify barriers that currently interfere with their wellness or overcoming obstacles.  3. Patient will identify feelings, thought process and behaviors related to these barriers. 4. Patient will identify two changes they are willing to make to overcome these obstacles:    Summary of Patient Progress   Patient did not attend group therapy today.    Therapeutic Modalities:   Cognitive Behavioral Therapy Solution Focused Therapy Motivational Interviewing Relapse Prevention Therapy   Vaidehi Braddy, LCSW  

## 2019-02-02 NOTE — Plan of Care (Signed)
  Problem: Activity: Goal: Sleeping patterns will improve Outcome: Progressing   Problem: Education: Goal: Knowledge of [facility] Education information/materials will improve Outcome: Not Progressing Goal: Emotional status will improve Outcome: Not Progressing Goal: Mental status will improve Outcome: Not Progressing Goal: Verbalization of understanding the information provided will improve Outcome: Not Progressing   Problem: Activity: Goal: Interest or engagement in activities will improve Outcome: Not Progressing   Problem: Coping: Goal: Ability to verbalize frustrations and anger appropriately will improve Outcome: Not Progressing

## 2019-02-02 NOTE — Plan of Care (Signed)
  Problem: Activity: Goal: Interest or engagement in activities will improve Outcome: Not Progressing Goal: Sleeping patterns will improve Outcome: Not Progressing  D: Patient refused all of her HS medication. Became delusional when I took her blood pressure and thought that the blood pressure machine was talking to her. BP 193/126. Patient refused hydralazine despite repeated encouragement, and walked toward this author with her hand out saying "you better get out of my face". Notified Dr. Danella Sensing. Patient is calm when staff does not attempt to give her any medication. No new orders given. A: Continue to monitor for safety. R: Safety maintained.

## 2019-02-02 NOTE — Progress Notes (Signed)
[facility]  02/02/2019 12:27 PM Erin Riley  MRN:  [id number]  Principal Problem: <principal problem not specified> Diagnosis: Active Problems:   Schizophrenia (HCC)  Total Time spent with patient: 20 minutes   Erin Riley is a 68yo F with past psych history of Schizoplhrenia, [facility] psychosis in settings of possible medication non-compliance.  Patient seen.  Chart reviewed. Patient discussed with nursing. Patient refused to take all medications last night, as a result, her blood pressure was high. I received several phone calls from nursing regarding patient`s blood pressure. After hours of encouragement, patient finally accepted blood pressure medication this am and her vitals are stable currently.  Subjective:  Patient continues to demand discharge home and delusional that her money are stolen while she is here in the hospital. She is calm and redirectable today. We discussed the possible severe consequences of uncontrolled high blood pressure and patient agreed to continue taking BP medicine. She was encouraged to take all medications, including psychotropic. Patient has no insight in her MH condition and states she does not need medications for her mind. Patient denies feeling depressed, anxious, suicidal, homicidal, denies hallucinations. She is not aggressive and not threatening in the milieu.   Past Medical History:  Past Medical History:  Diagnosis Date  . Chronic back pain   . Chronic kidney disease (CKD), stage II (mild)    Class I-II  . Coronary artery disease 04/2009   50% stenosis in the perforator of LAD; catheterization was for an abnormal Myoview in January 2000 showing anterior and inferolateral ischemia.  . Diverticulitis   . 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 > 3 subsequent Echos show improved/normal EF with moderate  to severe concentric LVH and diastolic dysfunction with LVOT/intracavitary gradient --> 06/2016: Severe LVH.  Vigorous EF, 65-70%.?? Gr 1 DD. Mild AS.  [name]. Hyperlipidemia   . Hypertension   . Hypertensive hypertrophic cardiomyopathy: NYHA class II:  Echo: Severe concentric LVH with LV OT gradient; essentially preserved EF with diastolic dysfunction 02/15/2013   Echo 06/2016: Severe Concentric LVH. Vigorous EF 65-70%. ~ Gr I DD.   . Mild aortic stenosis by prior echocardiography    Echo 06/2016: Mild AS (Mean Gradient 15 mmHg); has had prior Mod-Severe MR (not seen on current echo)  . PAD (peripheral artery disease) [facility](HCC) March 2013   Lower extremity Dopplers: R. SFA 50-60%, R. PTA proximally occluded with distal reconstitution;; L. common iliac ~50%, L. SFA 50-70% stenosis, L. PTA < 50%  . Schizophrenia [facility](HCC)     Past Surgical History:  Procedure Laterality Date  . BUNIONECTOMY    . carotid doppler  05/29/2011   left bulb/prox ICA moderate amtfibrous plaque with no evidence significant reduction.,right bulb /proximal ICA normal patency  . lower extremity doppler  05/29/2011   right SFA 50% to 59% diameter reduction,right posterior tibal atreery occlusive disease,reconstituting distally, left common illiac<50%,left SFA 50 to70%,left post. tibial <50%  . NM MYOCAR PERF WALL MOTION  03/2009   Persantine; EF 51%-both anterior and inferolateral ischemia  . TRANSTHORACIC ECHOCARDIOGRAM  06/2016   Severe LVH.  Vigorous EF of 65-70%.  No RWMA. ~Only grade 1 diastolic dysfunction.  Mild aortic stenosis (mean gradient 15 mmHg)  . TRANSTHORACIC ECHOCARDIOGRAM  07/2012   EF 50-55%; severe concentric LVH; only grade 1 diastolic dysfunction. Mild aortic sclerosis - with LVOT /intracavitary gradient  of roughly 20 mmHg mean. Mild to moderately dilated LA;; previously reported MR not seen   Family History:  Family History  Problem Relation Age of Onset  . Hypertension Mother   . Breast cancer Neg Hx      Social History:  Social History   Substance and Sexual Activity  Alcohol Use No     Social History   Substance and Sexual Activity  Drug Use No    Social History   Socioeconomic History  . Marital status: Single    Spouse name: Not on file  . Number of children: 7  . Years of education: Not on file  . Highest education level: Not on file  Occupational History  . Not on file  Social Needs  . Financial resource strain: Not on file  . Food insecurity    Worry: Not on file    Inability: Not on file  . Transportation needs    Medical: Not on file    Non-medical: Not on file  Tobacco Use  . Smoking status: Former Smoker    Types: Cigarettes    Quit date: 05/14/2002    Years since quitting: 16.7  . Smokeless tobacco: Never Used  Substance and Sexual Activity  . Alcohol use: No  . Drug use: No  . Sexual activity: Yes    Birth control/protection: Post-menopausal  Lifestyle  . Physical activity    Days per week: Not on file    Minutes per session: Not on file  . Stress: Not on file  Relationships  . Social Herbalist on phone: Not on file    Gets together: Not on file    Attends religious service: Not on file    Active member of club or organization: Not on file    Attends meetings of clubs or organizations: Not on file    Relationship status: Not on file  Other Topics Concern  . Not on file  Social History Narrative   Now single mother of 2 with one grandchild. She quit smoking roughly 5 years ago and is not so since. She has also stopped drinking alcohol. She does try get routine exercise walking at least a mile 3-4 days a week.    She lives with her 91 year old mother. 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   Additional Social History:                         Sleep: Good  Appetite:  Good  Current Medications: Current Facility-Administered Medications  Medication Dose Route Frequency Provider Last Rate Last Dose  . acetaminophen  (TYLENOL) tablet 650 mg  650 mg Oral Q6H PRN Sharma Covert, MD      . alum & mag hydroxide-simeth (MAALOX/MYLANTA) 200-200-20 MG/5ML suspension 30 mL  30 mL Oral Q4H PRN Sharma Covert, MD      . carvedilol (COREG) tablet 25 mg  25 mg Oral BID WC Larita Fife, MD   25 mg at 02/02/19 0757  . famotidine (PEPCID) tablet 20 mg  20 mg Oral QHS Ixchel Duck, MD      . hydrALAZINE (APRESOLINE) tablet 100 mg  100 mg Oral Q8H Mahad Newstrom, MD   100 mg at 02/02/19 0915  . isosorbide mononitrate (IMDUR) 24 hr tablet 30 mg  30 mg Oral Daily Bridgit Eynon, MD      . magnesium hydroxide (MILK OF MAGNESIA) suspension 30 mL  30 mL Oral Daily PRN Myles Lipps  Hart Rochester, MD      . OLANZapine [facility]) injection 5 mg  5 mg Intramuscular Q8H PRN Thalia Party, MD      . OLANZapine (ZYPREXA) tablet 10 mg  10 mg Oral QHS Thalia Party, MD      . OLANZapine (ZYPREXA) tablet 5 mg  5 mg Oral Q8H PRN Thalia Party, MD        Lab Results:  Results for orders placed or performed during the hospital encounter of 01/31/19 (from the past 48 hour(s))  Comprehensive metabolic panel     Status: Abnormal   Collection Time: 02/02/19 11:20 AM  Result Value Ref Range   Sodium 137 135 - 145 mmol/L   Potassium 4.3 3.5 - 5.1 mmol/L   Chloride 104 98 - 111 mmol/L   CO2 25 22 - 32 mmol/L   Glucose, Bld 101 (H) 70 - 99 mg/dL   BUN 20 8 - 23 mg/dL   Creatinine, Ser 1.44 (H) 0.44 - 1.00 mg/dL   Calcium 9.6 8.9 - 31.5 mg/dL   Total Protein 7.4 6.5 - 8.1 g/dL   Albumin 3.5 3.5 - 5.0 g/dL   AST 16 15 - 41 U/L   ALT 12 0 - 44 U/L   Alkaline Phosphatase 81 38 - 126 U/L   Total Bilirubin 0.5 0.3 - 1.2 mg/dL   GFR calc non Af Amer 49 (L) >60 mL/min   GFR calc Af Amer 57 (L) >60 mL/min   Anion gap 8 5 - 15    Comment: Performed at [facility], [address] [place], [place] [address]    Blood Alcohol level:  Lab Results  Component Value Date   [facility] <10 01/30/2019   ETH <10 01/17/2019    Metabolic Disorder  Labs: Lab Results  Component Value Date   HGBA1C 5.9 (H) 06/09/2016   MPG 123 06/09/2016   MPG 105 07/31/2012   Lab Results  Component Value Date   PROLACTIN 16.5 06/09/2016   Lab Results  Component Value Date   CHOL 123 08/31/2017   TRIG 120 08/31/2017   HDL 40 08/31/2017   CHOLHDL 3.1 08/31/2017   VLDL 55 (H) 06/09/2016   LDLCALC 59 08/31/2017   LDLCALC 80 06/09/2016    Physical Findings: AIMS:  , ,  ,  ,    CIWA:    COWS:     Objective:  Psychiatric Specialty Exam: Physical Exam  ROS  Blood pressure 102/71, pulse 98, temperature 97.7 F (36.5 C), temperature source Oral, resp. rate 18, height 5\' 7"  (1.702 m), weight 116.1 kg, last menstrual period 05/10/2013, SpO2 93 %.Body mass index is 40.1 kg/m.  General Appearance: Disheveled  Eye Contact:  Fair  Speech:  Pressured  Volume:  Normal  Mood:  Dysphoric  Affect:  Congruent and Labile  Thought Process:  Disorganized, Irrelevant and Descriptions of Associations: Circumstantial  Orientation:  Full (Time, Place, and Person)  Thought Content:  Illogical, Delusions, Obsessions, Paranoid Ideation and Rumination  Suicidal Thoughts:  No  Homicidal Thoughts:  No  Memory:  Immediate;   Fair Recent;   NA Remote;   NA  Judgement:  Impaired  Insight:  Lacking  Psychomotor Activity:  Normal  Concentration:  Concentration: Poor and Attention Span: Poor  Recall:  NA  Fund of Knowledge:  Fair  Language:  Good  Akathisia:  No  Handed:  Right  AIMS (if indicated):     Assets:  Housing Resilience  ADL's:  Intact  Cognition:  Impaired,  Mild  Sleep:  Number of Hours: 6.5     Treatment Plan Summary: Daily contact with patient to assess and evaluate symptoms and progress in treatment   Ms. Fern is a 68yo F with past psych history of Schizoplhrenia, who was admitted to inpatient [facility] unit with worsened psychosis in settings of possible medication non-compliance. On interview today patient is calm, but still obviously  psychotic, without insight in her mental condition and with impaired judgement. Patient encouraged to take all prescribed medications. Patient might require forced medications at some point if still not compliant.  Impression: Schizophrenia HTN CAD Cardiomyopathy  CKD stage II  Plan: -continue inpatient psych admission; 15-minute checks; daily contact with patient to assess and evaluate symptoms and progress in treatment; psychoeducation.  -Medications: continue Zyprexa  PO QHS for psychosis; continue Coreg  PO BID for cardiomyopathy  decrease Apresoline to  PO Q8H for hypertension (which is 1/2 of home dose to start due to patient was not taking medication at home for unknown duration). continue Imdur  PO daily for CAD. continue Pepcid  PO QHS for acid reflux.  -obtain EKG and re-check CMP (elevated creatinine).   -We will try to work on disposition as soon as possible  Thalia Party, MD 02/02/2019, 12:27 PM

## 2019-02-02 NOTE — [facility] Suicide Risk Assessment (Signed)
[facility] INPATIENT:  Family/Significant Other Suicide Prevention Education  Suicide Prevention Education:  Patient Refusal for Family/Significant Other Suicide Prevention Education: The patient Erin Riley has refused to provide written consent for family/significant other to be provided Family/Significant Other Suicide Prevention Education during admission and/or prior to discharge.  Physician notified.  Trecia Rogers 02/02/2019, 10:59 AM
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
## 2019-02-02 NOTE — Progress Notes (Signed)
D: Patient refused all of her HS medication. Became delusional when I took her blood pressure and thought that the blood pressure machine was talking to her. BP 193/126. Patient refused hydralazine despite repeated encouragement, and walked toward this author with her hand out saying "you better get out of my face". Notified Dr. Danella Sensing. Patient is calm when staff does not attempt to give her any medication. No new orders given. A: Continue to monitor for safety. R: Safety maintained.

## 2019-02-02 NOTE — Progress Notes (Signed)
D:Patient's morning blood pressure is 166/126 and pulse is 115. Patient agreed to take her blood pressure medication after Dian Situ, MHT talked with her and convinced her to take it. Patient stated "go on and give it to me, I got to get out of here because I got to pay my bills like that lady got to pay her bills". Notified Dr. Danella Sensing and Rufina Falco, NP. Did not take any psych medication.  A: Continue to monitor for safety. R: Safety maintained.

## 2019-02-02 NOTE — Progress Notes (Signed)
D: Patient did take all of her medications tonight at hs, but blood pressure continues to be elevated. When I saw her at beginning of shift and asked if I could do anything for her, she said "will you bring me my medication so I can take it so I can go home?". Patient is pleasant and cooperative, but still alert and oriented x1. Sitting in dayroom, watching TV with peers. Minimal interaction with staff and peers. Not voicing any suicidal or homicidal thoughts, or any delusional content, but continues to be internally preoccupied. A: Continue to monitor for safety. R: Safety maintained.

## 2019-02-02 NOTE — Progress Notes (Signed)
Patient continues to be dismissive to staff. Refusing medications. Previous nurse was able to get patient to take bp medications this am. Writer attempted to speak with patient, patient states "I know all that now get out the way." Staff member heard patient stating "If you don't fuck with me, I wont fuck with you." Patient isolative to self and room, will sit in dayroom with peers watching tv with no interaction.  Encouragement and support offered, medications offered. Patient refuses and rejects. Patient remains safe on unit with q 15 min checks.

## 2019-02-03 DIAGNOSIS — F203 Undifferentiated schizophrenia: Secondary | ICD-10-CM

## 2019-02-03 MED ORDER — OLANZAPINE 10 MG IM SOLR: 10.0000 mg | Freq: Two times a day (BID) | INTRAMUSCULAR | Status: DC | PRN

## 2019-02-03 MED ORDER — OLANZAPINE 10 MG PO TABS
10.0000 mg | ORAL_TABLET | Freq: Two times a day (BID) | ORAL | Status: DC
  Administered 2019-02-03 – 2019-02-05 (×4): 10 mg via ORAL
  Filled 2019-02-03 (×5): qty 1

## 2019-02-03 NOTE — Progress Notes (Signed)
[facility] MD Progress Note  02/03/2019 12:31 PM Erin Riley  MRN:  [id number] Subjective: Patient seen chart reviewed.  Patient known from previous encounters.  68 year old woman with schizophrenia who was transferred to [place] from [place].  She had presented to the hospital with psychotic somatic complaints.  On interview today the patient was very uncooperative.  As soon as she walked into the room she began shouting at me.  She would not listen to a word that I said.  I tried to ask her some gentle basic questions about her current living status and health status but she just continued being agitated shouting demanding to be discharged immediately and then storming out of the room without engaging in any conversation.  Her behavior suggests that she lacks the ability to think about her situation or engage in rational conversation or review of her needs.  Patient's blood pressure continues to run extremely high.  She did take according to the documentation her blood pressure medicine today and her Zyprexa yesterday. Principal Problem: Schizophrenia (HCC) Diagnosis: Principal Problem:   Schizophrenia (HCC) Active Problems:   Essential hypertension   Obesity (BMI 30-39.9)   Hypertensive hypertrophic cardiomyopathy: NYHA class II:    Total Time spent with patient: 30 minutes  Past Psychiatric History: Patient has a long history of schizophrenia.  She was seen at our hospital a few months ago under very similar circumstances.  Unfortunately it appears that she typically becomes noncompliant with all of her medicine including her blood pressure and cardiac medicines when she is psychotic.  Past Medical History:  Past Medical History:  Diagnosis Date  . Chronic back pain   . Chronic kidney disease (CKD), stage II (mild)    Class I-II  . Coronary artery disease 04/2009   50% stenosis in the perforator of LAD; catheterization was for an abnormal Myoview in January 2000 showing anterior and inferolateral  ischemia.  . Diverticulitis   . 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 > 3 subsequent Echos show improved/normal EF with moderate to severe concentric LVH and diastolic dysfunction with LVOT/intracavitary gradient --> 06/2016: Severe LVH.  Vigorous EF, 65-70%.?? Gr 1 DD. Mild AS.  [name] Hyperlipidemia   . Hypertension   . Hypertensive hypertrophic cardiomyopathy: NYHA class II:  Echo: Severe concentric LVH with LV OT gradient; essentially preserved EF with diastolic dysfunction 02/15/2013   Echo 06/2016: Severe Concentric LVH. Vigorous EF 65-70%. ~ Gr I DD.   . Mild aortic stenosis by prior echocardiography    Echo 06/2016: Mild AS (Mean Gradient 15 mmHg); has had prior Mod-Severe MR (not seen on current echo)  . PAD (peripheral artery disease) [facility]) March 2013   Lower extremity Dopplers: R. SFA 50-60%, R. PTA proximally occluded with distal reconstitution;; L. common iliac ~50%, L. SFA 50-70% stenosis, L. PTA < 50%  . Schizophrenia [facility])     Past Surgical History:  Procedure Laterality Date  . BUNIONECTOMY    . carotid doppler  05/29/2011   left bulb/prox ICA moderate amtfibrous plaque with no evidence significant reduction.,right bulb /proximal ICA normal patency  . lower extremity doppler  05/29/2011   right SFA 50% to 59% diameter reduction,right posterior tibal atreery occlusive disease,reconstituting distally, left common illiac<50%,left SFA 50 to70%,left post. tibial <50%  . NM MYOCAR PERF WALL MOTION  03/2009   Persantine; EF 51%-both anterior and inferolateral ischemia  . TRANSTHORACIC ECHOCARDIOGRAM  06/2016  Severe LVH.  Vigorous EF of 65-70%.  No RWMA. ~Only grade 1 diastolic dysfunction.  Mild aortic stenosis (mean gradient 15 mmHg)  . TRANSTHORACIC ECHOCARDIOGRAM  07/2012   EF 50-55%; severe concentric LVH; only grade 1 diastolic dysfunction. Mild aortic sclerosis - with LVOT  /intracavitary gradient of roughly 20 mmHg mean. Mild to moderately dilated LA;; previously reported MR not seen   Family History:  Family History  Problem Relation Age of Onset  . Hypertension Mother   . Breast cancer Neg Hx    Family Psychiatric  History: Unknown Social History:  Social History   Substance and Sexual Activity  Alcohol Use No     Social History   Substance and Sexual Activity  Drug Use No    Social History   Socioeconomic History  . Marital status: Single    Spouse name: Not on file  . Number of children: 7  . Years of education: Not on file  . Highest education level: Not on file  Occupational History  . Not on file  Social Needs  . Financial resource strain: Not on file  . Food insecurity    Worry: Not on file    Inability: Not on file  . Transportation needs    Medical: Not on file    Non-medical: Not on file  Tobacco Use  . Smoking status: Former Smoker    Types: Cigarettes    Quit date: 05/14/2002    Years since quitting: 16.7  . Smokeless tobacco: Never Used  Substance and Sexual Activity  . Alcohol use: No  . Drug use: No  . Sexual activity: Yes    Birth control/protection: Post-menopausal  Lifestyle  . Physical activity    Days per week: Not on file    Minutes per session: Not on file  . Stress: Not on file  Relationships  . Social Herbalist on phone: Not on file    Gets together: Not on file    Attends religious service: Not on file    Active member of club or organization: Not on file    Attends meetings of clubs or organizations: Not on file    Relationship status: Not on file  Other Topics Concern  . Not on file  Social History Narrative   Now single mother of 2 with one grandchild. She quit smoking roughly 5 years ago and is not so since. She has also stopped drinking alcohol. She does try get routine exercise walking at least a mile 3-4 days a week.    She lives with her 43 year old mother. 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   Additional Social History:                         Sleep: Negative  Appetite:  Negative  Current Medications: Current Facility-Administered Medications  Medication Dose Route Frequency Provider Last Rate Last Dose  . acetaminophen (TYLENOL) tablet 650 mg  650 mg Oral Q6H PRN Sharma Covert, MD      . alum & mag hydroxide-simeth (MAALOX/MYLANTA) 200-200-20 MG/5ML suspension 30 mL  30 mL Oral Q4H PRN Sharma Covert, MD      . carvedilol (COREG) tablet 25 mg  25 mg Oral BID WC Larita Fife, MD   25 mg at 02/03/19 0821  . famotidine (PEPCID) tablet 20 mg  20 mg Oral QHS Larita Fife, MD   20 mg at 02/02/19 2053  . hydrALAZINE (APRESOLINE) tablet 50  mg  50 mg Oral Q8H Thalia Party, MD   50 mg at 02/03/19 0610  . isosorbide mononitrate (IMDUR) 24 hr tablet 30 mg  30 mg Oral Daily Thalia Party, MD   30 mg at 02/03/19 0821  . magnesium hydroxide (MILK OF MAGNESIA) suspension 30 mL  30 mL Oral Daily PRN Antonieta Pert, MD      . OLANZapine [facility]) injection 10 mg  10 mg Intramuscular BID PRN Clapacs, Jackquline Denmark, MD      . OLANZapine (ZYPREXA) tablet 10 mg  10 mg Oral BID Clapacs, Jackquline Denmark, MD      . OLANZapine (ZYPREXA) tablet 5 mg  5 mg Oral Q8H PRN Thalia Party, MD        Lab Results:  Results for orders placed or performed during the hospital encounter of 01/31/19 (from the past 48 hour(s))  Comprehensive metabolic panel     Status: Abnormal   Collection Time: 02/02/19 11:20 AM  Result Value Ref Range   Sodium 137 135 - 145 mmol/L   Potassium 4.3 3.5 - 5.1 mmol/L   Chloride 104 98 - 111 mmol/L   CO2 25 22 - 32 mmol/L   Glucose, Bld 101 (H) 70 - 99 mg/dL   BUN 20 8 - 23 mg/dL   Creatinine, Ser 1.61 (H) 0.44 - 1.00 mg/dL   Calcium 9.6 8.9 - 09.6 mg/dL   Total Protein 7.4 6.5 - 8.1 g/dL   Albumin 3.5 3.5 - 5.0 g/dL   AST 16 15 - 41 U/L   ALT 12 0 - 44 U/L   Alkaline Phosphatase 81 38 - 126 U/L   Total Bilirubin 0.5 0.3 - 1.2 mg/dL   GFR calc  non Af Amer 49 (L) >60 mL/min   GFR calc Af Amer 57 (L) >60 mL/min   Anion gap 8 5 - 15    Comment: Performed at [facility], [address] [place], [place] [address]    Blood Alcohol level:  Lab Results  Component Value Date   [facility] <10 01/30/2019   ETH <10 01/17/2019    Metabolic Disorder Labs: Lab Results  Component Value Date   HGBA1C 5.9 (H) 06/09/2016   MPG 123 06/09/2016   MPG 105 07/31/2012   Lab Results  Component Value Date   PROLACTIN 16.5 06/09/2016   Lab Results  Component Value Date   CHOL 123 08/31/2017   TRIG 120 08/31/2017   HDL 40 08/31/2017   CHOLHDL 3.1 08/31/2017   VLDL 55 (H) 06/09/2016   LDLCALC 59 08/31/2017   LDLCALC 80 06/09/2016    Physical Findings: AIMS:  , ,  ,  ,    CIWA:    COWS:     Musculoskeletal: Strength & Muscle Tone: within normal limits Gait & Station: normal Patient leans: N/A  Psychiatric Specialty Exam: Physical Exam  Nursing note and vitals reviewed. Constitutional: She appears well-developed and well-nourished.  HENT:  Head: Normocephalic and atraumatic.  Eyes: Pupils are equal, round, and reactive to light. Conjunctivae are normal.  Neck: Normal range of motion.  Cardiovascular: Regular rhythm and normal heart sounds.  Respiratory: Effort normal. No respiratory distress.  GI: Soft.  Musculoskeletal: Normal range of motion.  Neurological: She is alert.  Skin: Skin is warm and dry.  Psychiatric: Her affect is labile and inappropriate. Her speech is tangential. She is agitated. She is not aggressive. Thought content is paranoid and delusional. Cognition and memory are impaired. She expresses inappropriate judgment.  Review of Systems  Unable to perform ROS: Psychiatric disorder    Blood pressure (!) 180/101, pulse 96, temperature (!) 97.5 F (36.4 C), temperature source Oral, resp. rate 18, height 5\' 7"  (1.702 m), weight 116.1 kg, last menstrual period 05/10/2013, SpO2 98 %.Body mass index is 40.1  kg/m.  General Appearance: Disheveled  Eye Contact:  Fair  Speech:  Garbled and Pressured  Volume:  Increased  Mood:  Angry and Irritable  Affect:  Inappropriate and Labile  Thought Process:  Disorganized  Orientation:  Negative  Thought Content:  Illogical, Delusions, Paranoid Ideation, Rumination and Tangential  Suicidal Thoughts:  No  Homicidal Thoughts:  No  Memory:  Negative  Judgement:  Impaired  Insight:  Lacking  Psychomotor Activity:  Restlessness  Concentration:  Concentration: Poor  Recall:  Poor  Fund of Knowledge:  Poor  Language:  Fair  Akathisia:  No  Handed:  Right  AIMS (if indicated):     Assets:  Housing  ADL's:  Impaired  Cognition:  Impaired,  Mild and Moderate  Sleep:  Number of Hours: 7.25     Treatment Plan Summary: Daily contact with patient to assess and evaluate symptoms and progress in treatment, Medication management and Plan Patient is currently not even able to engage in a conversation about discharge which for now at least puts discharge planning completely off the table.  I am pleased that she has been taking her blood pressure and psychiatric medicine at least for the last day although she does have a history of noncompliance.  I have clarified in the orders that she should be given IM Zyprexa if she refuses the oral and I have increased the oral to 10 mg twice a day.  I am hoping that we can get at least enough lucidity that we can have a rational conversation about other issues such as her blood pressure.  Mordecai RasmussenJohn Clapacs, MD 02/03/2019, 12:31 PM

## 2019-02-03 NOTE — Progress Notes (Signed)
Recreation Therapy Notes  INPATIENT RECREATION THERAPY ASSESSMENT  Patient Details Name: Erin Riley MRN: [id number] DOB: 1950-12-04 Today's Date: 02/03/2019       Information Obtained From: Patient  Able to Participate in Assessment/Interview: Yes  Patient Presentation: Responsive  Reason for Admission (Per Patient): Active Symptoms  Patient Stressors:    Coping Skills:   Exercise  Leisure Interests (2+):  Exercise - Walking  Frequency of Recreation/Participation: Monthly  Awareness of Community Resources:     Community Resources:     Current Use:    If no, Barriers?:    Expressed Interest in [employer] Information:    [place] of Residence:  [place]  Patient Main Form of Transportation: Taxi  Patient Strengths:  Activities  Patient Identified Areas of Improvement:  N/A  Patient Goal for Hospitalization:  Getting rest  Current SI (including self-harm):  No  Current HI:  No  Current AVH: No  Staff Intervention Plan: Group Attendance, Collaborate with Interdisciplinary Treatment Team  Consent to Intern Participation: N/A  Erin Riley 02/03/2019, 1:55 PM

## 2019-02-03 NOTE — [facility] Group Notes (Signed)
LCSW Group Therapy Note   02/03/2019 11:24 AM   Type of Therapy and Topic:  Group Therapy:  Overcoming Obstacles   Participation Level:  Did Not Attend   Description of Group:    In this group patients will be encouraged to explore what they see as obstacles to their own wellness and recovery. They will be guided to discuss their thoughts, feelings, and behaviors related to these obstacles. The group will process together ways to cope with barriers, with attention given to specific choices patients can make. Each patient will be challenged to identify changes they are motivated to make in order to overcome their obstacles. This group will be process-oriented, with patients participating in exploration of their own experiences as well as giving and receiving support and challenge from other group members.   Therapeutic Goals: 1. Patient will identify personal and current obstacles as they relate to admission. 2. Patient will identify barriers that currently interfere with their wellness or overcoming obstacles.  3. Patient will identify feelings, thought process and behaviors related to these barriers. 4. Patient will identify two changes they are willing to make to overcome these obstacles:      Summary of Patient Progress x     Therapeutic Modalities:   Cognitive Behavioral Therapy Solution Focused Therapy Motivational Interviewing Relapse Prevention Therapy  Erin Riley, MSW, LCSW Clinical Social Work 02/03/2019 11:24 AM   

## 2019-02-03 NOTE — Plan of Care (Signed)
Patient talks delusional and not receptive with what staff talks.Patient got agitated after talking to the doctor states "I need to go home now."Patient took PRN medicines with encouragement.Patient is visible in the milieu with no interactions with peers.Patient could not make any logical conversation with staff.Compliant with medications.Appetite and energy level good.Support ans encouragement given.

## 2019-02-03 NOTE — Progress Notes (Signed)
Recreation Therapy Notes  Date: 02/03/2019  Time: 9:30 am   Location: Craft room   Behavioral response: N/A   Intervention Topic: Stress  Discussion/Intervention: Patient did not attend group.   Clinical Observations/Feedback:  Patient did not attend group.   Cason Luffman LRT/CTRS        Annalis Kaczmarczyk 02/03/2019 11:23 AM

## 2019-02-04 NOTE — Tx Team (Addendum)
Interdisciplinary Treatment and Diagnostic Plan Update  02/04/2019 Time of Session: 900am SHERITA DECOSTE MRN: [id number]  Principal Diagnosis: Schizophrenia [facility])  Secondary Diagnoses: Principal Problem:   Schizophrenia ([place]) Active Problems:   Essential hypertension   Obesity (BMI 30-39.9)   Hypertensive hypertrophic cardiomyopathy: NYHA class II:     Current Medications:  Current Facility-Administered Medications  Medication Dose Route Frequency Provider Last Rate Last Dose  . acetaminophen (TYLENOL) tablet 650 mg  650 mg Oral Q6H PRN Sharma Covert, MD      . alum & mag hydroxide-simeth (MAALOX/MYLANTA) 200-200-20 MG/5ML suspension 30 mL  30 mL Oral Q4H PRN Sharma Covert, MD      . carvedilol (COREG) tablet 25 mg  25 mg Oral BID WC Larita Fife, MD   25 mg at 02/04/19 0831  . famotidine (PEPCID) tablet 20 mg  20 mg Oral QHS Paliy, Delrae Rend, MD   20 mg at 02/03/19 2122  . hydrALAZINE (APRESOLINE) tablet 50 mg  50 mg Oral Q8H Larita Fife, MD   50 mg at 02/04/19 6314  . isosorbide mononitrate (IMDUR) 24 hr tablet 30 mg  30 mg Oral Daily Larita Fife, MD   30 mg at 02/04/19 0820  . magnesium hydroxide (MILK OF MAGNESIA) suspension 30 mL  30 mL Oral Daily PRN Sharma Covert, MD      . OLANZapine [facility]) injection 10 mg  10 mg Intramuscular BID PRN Clapacs, John T, MD      . OLANZapine (ZYPREXA) tablet 10 mg  10 mg Oral BID Clapacs, Madie Reno, MD   10 mg at 02/04/19 0820  . OLANZapine (ZYPREXA) tablet 5 mg  5 mg Oral Q8H PRN Larita Fife, MD   5 mg at 02/03/19 1235   PTA Medications: Medications Prior to Admission  Medication Sig Dispense Refill Last Dose  . carvedilol (COREG) 25 MG tablet Take 2 tablets (50 mg total) by mouth 2 (two) times daily with a meal. (Patient taking differently: Take 25 mg by mouth 2 (two) times daily with a meal. ) 30 tablet 0   . cephALEXin (KEFLEX) 500 MG capsule Take 1 capsule (500 mg total) by mouth 2 (two) times daily. 10 capsule 0   . famotidine  (PEPCID) 20 MG tablet Take 1 tablet (20 mg total) by mouth at bedtime. 30 tablet 0   . hydrALAZINE (APRESOLINE) 100 MG tablet Take 1 tablet (100 mg total) by mouth every 8 (eight) hours. 60 tablet 0   . isosorbide mononitrate (IMDUR) 30 MG 24 hr tablet Take 3 tablets (90 mg total) by mouth daily. 90 tablet 0   . OLANZapine (ZYPREXA) 10 MG tablet Take 1 tablet (10 mg total) by mouth at bedtime. (Patient not taking: Reported on 10/01/2018) 30 tablet 0   . OLANZapine (ZYPREXA) 5 MG tablet Take 1 tablet (5 mg total) by mouth daily. (Patient not taking: Reported on 10/01/2018) 30 tablet 0     Patient Stressors:    Patient Strengths:    Treatment Modalities: Medication Management, Group therapy, Case management,  1 to 1 session with clinician, Psychoeducation, Recreational therapy.   Physician Treatment Plan for Primary Diagnosis: Schizophrenia ([place]) Long Term Goal(s): Improvement in symptoms so as ready for discharge Improvement in symptoms so as ready for discharge   Short Term Goals: Ability to identify changes in lifestyle to reduce recurrence of condition will improve Ability to verbalize feelings will improve Ability to disclose and discuss suicidal ideas Ability to demonstrate self-control will improve Ability to  identify and develop effective coping behaviors will improve Ability to maintain clinical measurements within normal limits will improve Compliance with prescribed medications will improve Ability to identify triggers associated with substance abuse/mental health issues will improve Ability to identify changes in lifestyle to reduce recurrence of condition will improve Ability to verbalize feelings will improve Ability to disclose and discuss suicidal ideas Ability to demonstrate self-control will improve Ability to identify and develop effective coping behaviors will improve Ability to maintain clinical measurements within normal limits will improve Compliance with  prescribed medications will improve Ability to identify triggers associated with substance abuse/mental health issues will improve  Medication Management: Evaluate patient's response, side effects, and tolerance of medication regimen.  Therapeutic Interventions: 1 to 1 sessions, Unit Group sessions and Medication administration.  Evaluation of Outcomes: Not Met  Physician Treatment Plan for Secondary Diagnosis: Principal Problem:   Schizophrenia ([place]) Active Problems:   Essential hypertension   Obesity (BMI 30-39.9)   Hypertensive hypertrophic cardiomyopathy: NYHA class II:    Long Term Goal(s): Improvement in symptoms so as ready for discharge Improvement in symptoms so as ready for discharge   Short Term Goals: Ability to identify changes in lifestyle to reduce recurrence of condition will improve Ability to verbalize feelings will improve Ability to disclose and discuss suicidal ideas Ability to demonstrate self-control will improve Ability to identify and develop effective coping behaviors will improve Ability to maintain clinical measurements within normal limits will improve Compliance with prescribed medications will improve Ability to identify triggers associated with substance abuse/mental health issues will improve Ability to identify changes in lifestyle to reduce recurrence of condition will improve Ability to verbalize feelings will improve Ability to disclose and discuss suicidal ideas Ability to demonstrate self-control will improve Ability to identify and develop effective coping behaviors will improve Ability to maintain clinical measurements within normal limits will improve Compliance with prescribed medications will improve Ability to identify triggers associated with substance abuse/mental health issues will improve     Medication Management: Evaluate patient's response, side effects, and tolerance of medication regimen.  Therapeutic Interventions: 1 to 1  sessions, Unit Group sessions and Medication administration.  Evaluation of Outcomes: Not Met   RN Treatment Plan for Primary Diagnosis: Schizophrenia ([place]) Long Term Goal(s): Knowledge of disease and therapeutic regimen to maintain health will improve  Short Term Goals: Ability to participate in decision making will improve, Ability to verbalize feelings will improve, Ability to identify and develop effective coping behaviors will improve and Compliance with prescribed medications will improve  Medication Management: RN will administer medications as ordered by provider, will assess and evaluate patient's response and provide education to patient for prescribed medication. RN will report any adverse and/or side effects to prescribing provider.  Therapeutic Interventions: 1 on 1 counseling sessions, Psychoeducation, Medication administration, Evaluate responses to treatment, Monitor vital signs and CBGs as ordered, Perform/monitor CIWA, COWS, AIMS and Fall Risk screenings as ordered, Perform wound care treatments as ordered.  Evaluation of Outcomes: Not Met   LCSW Treatment Plan for Primary Diagnosis: Schizophrenia ([place]) Long Term Goal(s): Safe transition to appropriate next level of care at discharge, Engage patient in therapeutic group addressing interpersonal concerns.  Short Term Goals: Engage patient in aftercare planning with referrals and resources, Increase ability to appropriately verbalize feelings, Increase emotional regulation and Increase skills for wellness and recovery  Therapeutic Interventions: Assess for all discharge needs, 1 to 1 time with Social worker, Explore available resources and support systems, Assess for adequacy in community support  network, Educate family and significant other(s) on suicide prevention, Complete Psychosocial Assessment, Interpersonal group therapy.  Evaluation of Outcomes: Not Met   Progress in Treatment: Attending groups: No. Participating  in groups: No. Taking medication as prescribed: Yes. Toleration medication: Yes. Family/Significant other contact made: No, will contact:  pt declined collateral contact Patient understands diagnosis: Yes. Discussing patient identified problems/goals with staff: No. Medical problems stabilized or resolved: Yes. Denies suicidal/homicidal ideation: Yes. Issues/concerns per patient self-inventory: No. Other: N/A  New problem(s) identified: No, Describe:  none  New Short Term/Long Term Goal(s): Detox, elimination of AVH/symptoms of psychosis, medication management for mood stabilization; elimination of SI thoughts; development of comprehensive mental wellness/sobriety plan.   Patient Goals:  Patient refused to attend treatment team, no goal obtained  Discharge Plan or Barriers: SPE pamphlet, Mobile Crisis information, and AA/NA information provided to patient for additional community support and resources. Pt currently refusing aftercare, CSW assessing for appropriate referrals.  Reason for Continuation of Hospitalization: Medication stabilization  Estimated Length of Stay: 3-5 days  Recreational Therapy: Patient Stressors: N/A  Patient Goal: Patient will engage in groups without prompting or encouragement from LRT x3 group sessions within 5 recreation therapy group sessions   Attendees: Patient: Erin Riley  02/04/2019 11:17 AM  Physician: DR Weber Cooks MD 02/04/2019 11:17 AM  Nursing: Polly Cobia RN 02/04/2019 11:17 AM  RN Care Manager: 02/04/2019 11:17 AM  Social Worker: Minette Brine Moton LCSW 02/04/2019 11:17 AM  Recreational Therapist: Roanna Epley CTRS LRT 02/04/2019 11:17 AM  Other: Sanjuana Kava LCSW  02/04/2019 11:17 AM  Other:  02/04/2019 11:17 AM  Other: 02/04/2019 11:17 AM    Scribe for Treatment Team: Mariann Laster Moton, LCSW 02/04/2019 11:17 AM

## 2019-02-04 NOTE — Plan of Care (Signed)
Pt denies anxiety, depression, SI, HI and AVH. Pt was educated on care plan and verbalizes understanding. Collier Bullock RN Problem: Education: Goal: Knowledge of [facility] Education information/materials will improve Outcome: Progressing Goal: Emotional status will improve Outcome: Progressing Goal: Mental status will improve Outcome: Progressing Goal: Verbalization of understanding the information provided will improve Outcome: Progressing   Problem: Activity: Goal: Interest or engagement in activities will improve Outcome: Progressing Goal: Sleeping patterns will improve Outcome: Progressing   Problem: Coping: Goal: Ability to verbalize frustrations and anger appropriately will improve Outcome: Progressing Goal: Ability to demonstrate self-control will improve Outcome: Progressing   Problem: Health Behavior/Discharge Planning: Goal: Identification of resources available to assist in meeting health care needs will improve Outcome: Progressing Goal: Compliance with treatment plan for underlying cause of condition will improve Outcome: Progressing   Problem: Physical Regulation: Goal: Ability to maintain clinical measurements within normal limits will improve Outcome: Progressing   Problem: Safety: Goal: Periods of time without injury will increase Outcome: Progressing   Problem: Activity: Goal: Will verbalize the importance of balancing activity with adequate rest periods Outcome: Progressing   Problem: Education: Goal: Will be free of psychotic symptoms Outcome: Progressing Goal: Knowledge of the prescribed therapeutic regimen will improve Outcome: Progressing   Problem: Coping: Goal: Coping ability will improve Outcome: Progressing Goal: Will verbalize feelings Outcome: Progressing   Problem: Nutritional: Goal: Ability to achieve adequate nutritional intake will improve Outcome: Progressing   Problem: Role Relationship: Goal: Ability to communicate  needs accurately will improve Outcome: Progressing Goal: Ability to interact with others will improve Outcome: Progressing   Problem: Safety: Goal: Ability to redirect hostility and anger into socially appropriate behaviors will improve Outcome: Progressing Goal: Ability to remain free from injury will improve Outcome: Progressing   Problem: Self-Care: Goal: Ability to participate in self-care as condition permits will improve Outcome: Progressing   Problem: Self-Concept: Goal: Will verbalize positive feelings about self Outcome: Progressing

## 2019-02-04 NOTE — Progress Notes (Signed)
Recreation Therapy Notes  Date: 02/04/2019  Time: 9:30 am   Location: Craft room   Behavioral response: N/A   Intervention Topic: Strengths   Discussion/Intervention: Patient did not attend group.   Clinical Observations/Feedback:  Patient did not attend group.   Alain Deschene LRT/CTRS        Deshannon Hinchliffe 02/04/2019 11:10 AM

## 2019-02-04 NOTE — Progress Notes (Signed)
D: Patient slept most of the shift. Pt took her medication. Blood pressure is improved. Continues to have disorganized thoughts. Forwards little. Not voicing SI or HI. A: Continue to monitor for safety R: safety maintained.

## 2019-02-04 NOTE — Plan of Care (Signed)
  Problem: Education: Goal: Knowledge of [facility] Education information/materials will improve Outcome: Not Progressing Goal: Emotional status will improve Outcome: Not Progressing Goal: Mental status will improve Outcome: Not Progressing Goal: Verbalization of understanding the information provided will improve Outcome: Not Progressing  D: Patient slept most of the shift. Pt took her medication. Blood pressure is improved. Continues to have disorganized thoughts. Forwards little. Not voicing SI or HI. A: Continue to monitor for safety R: safety maintained.

## 2019-02-04 NOTE — [facility] Group Notes (Signed)
Feelings Around Diagnosis 02/04/2019 1PM  Type of Therapy/Topic:  Group Therapy:  Feelings about Diagnosis  Participation Level:  Did Not Attend   Description of Group:   This group will allow patients to explore their thoughts and feelings about diagnoses they have received. Patients will be guided to explore their level of understanding and acceptance of these diagnoses. Facilitator will encourage patients to process their thoughts and feelings about the reactions of others to their diagnosis and will guide patients in identifying ways to discuss their diagnosis with significant others in their lives. This group will be process-oriented, with patients participating in exploration of their own experiences, giving and receiving support, and processing challenge from other group members.   Therapeutic Goals: 1. Patient will demonstrate understanding of diagnosis as evidenced by identifying two or more symptoms of the disorder 2. Patient will be able to express two feelings regarding the diagnosis 3. Patient will demonstrate their ability to communicate their needs through discussion and/or role play  Summary of Patient Progress:       Therapeutic Modalities:   Cognitive Behavioral Therapy Brief Therapy Feelings Identification    Yvette Rack, LCSW 02/04/2019 2:10 PM

## 2019-02-04 NOTE — Progress Notes (Signed)
[facility] MD Progress Note  02/04/2019 12:15 PM Erin Riley  MRN:  [id number] Subjective: I am all right.  My sisters did the skin off my mother's body over there at that nursing home.  And now they do not want me to see her.  They started doing the same thing with me.  I am not crazy either side on the right and that they will.  Objective: Patient seen and chart reviewed.  Patient well-known to our facility due to multiple admissions.  She is a 68 year old female with schizophrenia who presented to the hospital with psychotic somatic complaints.  During today's evaluation she continues to be grudgingly cooperative.  She was not turned over and or sit up in order to participate in evaluation.  Patient continues to have one-sided mind.  She continues to have no insight into her mental condition and or her medical conditions.  As noted she has refused her medications, and does not participate in any therapeutic milieu.  She remains uninterested in any discussion at this time.  Patient denies any psychosis, however proceeds to hold up her arm and show Clinical research associate where her skin is missing.  Area was examined briefly and no new changes and or injuries noted. She endorses sleeping well, and eating with no disturbances at this time.  She denies any suicidal ideations, homicidal ideations, and or hallucinations. Principal Problem: Schizophrenia (HCC) Diagnosis: Principal Problem:   Schizophrenia (HCC) Active Problems:   Essential hypertension   Obesity (BMI 30-39.9)   Hypertensive hypertrophic cardiomyopathy: NYHA class II:    Total Time spent with patient: 30 minutes  Past Psychiatric History: Patient has a long history of schizophrenia.  She was seen at our hospital a few months ago under very similar circumstances.  Unfortunately it appears that she typically becomes noncompliant with all of her medicine including her blood pressure and cardiac medicines when she is psychotic.  Past Medical History:  Past  Medical History:  Diagnosis Date  . Chronic back pain   . Chronic kidney disease (CKD), stage II (mild)    Class I-II  . Coronary artery disease 04/2009   50% stenosis in the perforator of LAD; catheterization was for an abnormal Myoview in January 2000 showing anterior and inferolateral ischemia.  . Diverticulitis   . 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 > 3 subsequent Echos show improved/normal EF with moderate to severe concentric LVH and diastolic dysfunction with LVOT/intracavitary gradient --> 06/2016: Severe LVH.  Vigorous EF, 65-70%.?? Gr 1 DD. Mild AS.  [name] Hyperlipidemia   . Hypertension   . Hypertensive hypertrophic cardiomyopathy: NYHA class II:  Echo: Severe concentric LVH with LV OT gradient; essentially preserved EF with diastolic dysfunction 02/15/2013   Echo 06/2016: Severe Concentric LVH. Vigorous EF 65-70%. ~ Gr I DD.   . Mild aortic stenosis by prior echocardiography    Echo 06/2016: Mild AS (Mean Gradient 15 mmHg); has had prior Mod-Severe MR (not seen on current echo)  . PAD (peripheral artery disease) [facility]) March 2013   Lower extremity Dopplers: R. SFA 50-60%, R. PTA proximally occluded with distal reconstitution;; L. common iliac ~50%, L. SFA 50-70% stenosis, L. PTA < 50%  . Schizophrenia [facility])     Past Surgical History:  Procedure Laterality Date  . BUNIONECTOMY    . carotid doppler  05/29/2011   left bulb/prox ICA moderate amtfibrous plaque with no evidence significant reduction.,right bulb Jake Seats  ICA normal patency  . lower extremity doppler  05/29/2011   right SFA 50% to 59% diameter reduction,right posterior tibal atreery occlusive disease,reconstituting distally, left common illiac<50%,left SFA 50 to70%,left post. tibial <50%  . NM MYOCAR PERF WALL MOTION  03/2009   Persantine; EF 51%-both anterior and inferolateral ischemia  . TRANSTHORACIC ECHOCARDIOGRAM   06/2016   Severe LVH.  Vigorous EF of 65-70%.  No RWMA. ~Only grade 1 diastolic dysfunction.  Mild aortic stenosis (mean gradient 15 mmHg)  . TRANSTHORACIC ECHOCARDIOGRAM  07/2012   EF 50-55%; severe concentric LVH; only grade 1 diastolic dysfunction. Mild aortic sclerosis - with LVOT /intracavitary gradient of roughly 20 mmHg mean. Mild to moderately dilated LA;; previously reported MR not seen   Family History:  Family History  Problem Relation Age of Onset  . Hypertension Mother   . Breast cancer Neg Hx    Family Psychiatric  History: Unknown Social History:  Social History   Substance and Sexual Activity  Alcohol Use No     Social History   Substance and Sexual Activity  Drug Use No    Social History   Socioeconomic History  . Marital status: Single    Spouse name: Not on file  . Number of children: 7  . Years of education: Not on file  . Highest education level: Not on file  Occupational History  . Not on file  Social Needs  . Financial resource strain: Not on file  . Food insecurity    Worry: Not on file    Inability: Not on file  . Transportation needs    Medical: Not on file    Non-medical: Not on file  Tobacco Use  . Smoking status: Former Smoker    Types: Cigarettes    Quit date: 05/14/2002    Years since quitting: 16.7  . Smokeless tobacco: Never Used  Substance and Sexual Activity  . Alcohol use: No  . Drug use: No  . Sexual activity: Yes    Birth control/protection: Post-menopausal  Lifestyle  . Physical activity    Days per week: Not on file    Minutes per session: Not on file  . Stress: Not on file  Relationships  . Social Musicianconnections    Talks on phone: Not on file    Gets together: Not on file    Attends religious service: Not on file    Active member of club or organization: Not on file    Attends meetings of clubs or organizations: Not on file    Relationship status: Not on file  Other Topics Concern  . Not on file  Social History  Narrative   Now single mother of 2 with one grandchild. She quit smoking roughly 5 years ago and is not so since. She has also stopped drinking alcohol. She does try get routine exercise walking at least a mile 3-4 days a week.    She lives with her 67 year old mother. 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   Additional Social History:                         Sleep: Fair  Appetite:  Fair  Current Medications: Current Facility-Administered Medications  Medication Dose Route Frequency Provider Last Rate Last Dose  . acetaminophen (TYLENOL) tablet 650 mg  650 mg Oral Q6H PRN Antonieta Pertlary, Greg Lawson, MD      . alum & mag hydroxide-simeth (MAALOX/MYLANTA) 200-200-20 MG/5ML suspension 30 mL  30 mL Oral Q4H  PRN Antonieta Pert, MD      . carvedilol (COREG) tablet 25 mg  25 mg Oral BID WC Thalia Party, MD   25 mg at 02/04/19 0831  . famotidine (PEPCID) tablet 20 mg  20 mg Oral QHS Paliy, Serina Cowper, MD   20 mg at 02/03/19 2122  . hydrALAZINE (APRESOLINE) tablet 50 mg  50 mg Oral Q8H Thalia Party, MD   50 mg at 02/04/19 9604  . isosorbide mononitrate (IMDUR) 24 hr tablet 30 mg  30 mg Oral Daily Thalia Party, MD   30 mg at 02/04/19 0820  . magnesium hydroxide (MILK OF MAGNESIA) suspension 30 mL  30 mL Oral Daily PRN Antonieta Pert, MD      . OLANZapine [facility]) injection 10 mg  10 mg Intramuscular BID PRN Clapacs, John T, MD      . OLANZapine (ZYPREXA) tablet 10 mg  10 mg Oral BID Clapacs, Jackquline Denmark, MD   10 mg at 02/04/19 0820  . OLANZapine (ZYPREXA) tablet 5 mg  5 mg Oral Q8H PRN Thalia Party, MD   5 mg at 02/03/19 1235    Lab Results:  No results found for this or any previous visit (from the past 48 hour(s)).  Blood Alcohol level:  Lab Results  Component Value Date   ETH <10 01/30/2019   ETH <10 01/17/2019    Metabolic Disorder Labs: Lab Results  Component Value Date   HGBA1C 5.9 (H) 06/09/2016   MPG 123 06/09/2016   MPG 105 07/31/2012   Lab Results  Component Value Date    PROLACTIN 16.5 06/09/2016   Lab Results  Component Value Date   CHOL 123 08/31/2017   TRIG 120 08/31/2017   HDL 40 08/31/2017   CHOLHDL 3.1 08/31/2017   VLDL 55 (H) 06/09/2016   LDLCALC 59 08/31/2017   LDLCALC 80 06/09/2016    Physical Findings: AIMS:  , ,  ,  ,    CIWA:    COWS:     Musculoskeletal: Strength & Muscle Tone: within normal limits Gait & Station: normal Patient leans: N/A  Psychiatric Specialty Exam: Physical Exam  Nursing note and vitals reviewed. Constitutional: She appears well-developed and well-nourished.  HENT:  Head: Normocephalic and atraumatic.  Eyes: Pupils are equal, round, and reactive to light. Conjunctivae are normal.  Neck: Normal range of motion.  Cardiovascular: Regular rhythm and normal heart sounds.  Respiratory: Effort normal. No respiratory distress.  GI: Soft.  Musculoskeletal: Normal range of motion.  Neurological: She is alert.  Skin: Skin is warm and dry.  Psychiatric: Her affect is labile and inappropriate. Her speech is tangential. She is agitated. She is not aggressive. Thought content is paranoid and delusional. Cognition and memory are impaired. She expresses inappropriate judgment.    Review of Systems  Unable to perform ROS: Psychiatric disorder    Blood pressure (!) 145/83, pulse 85, temperature 97.6 F (36.4 C), temperature source Oral, resp. rate 16, height  (1.702 m), weight 116.1 kg, last menstrual period 05/10/2013, SpO2 96 %.Body mass index is 40.1 kg/m.  General Appearance: Wearing purple scrubs and hair piece is disarray.  Eye Contact:  Poor  Speech:  Clear and Coherent and Normal Rate  Volume:  Decreased  Mood:  Angry and Irritable  Affect:  Congruent  Thought Process:  Irrelevant and Descriptions of Associations: Tangential  Orientation:  NA  Thought Content:  Illogical, Delusions, Paranoid Ideation, Rumination and Tangential  Suicidal Thoughts:  Denies  Homicidal Thoughts:  Denies  Memory:   Immediate;   Fair  Judgement:  Impaired  Insight:  Lacking  Psychomotor Activity:  Psychomotor Retardation and Restlessness  Concentration:  Concentration: Poor and Attention Span: Poor  Recall:  Poor  Fund of Knowledge:  Poor  Language:  Fair  Akathisia:  No  Handed:  Right  AIMS (if indicated):     Assets:  Housing  ADL's:  Impaired  Cognition:  Impaired,  Mild and Moderate  Sleep:  Number of Hours: 6     Treatment Plan Summary: At this time have reviewed treatment plan and will make no adjustments.  Patient is inconsistent and nonadherent to treatment plan at times.  Will continue to offer medications as prescribed. Daily contact with patient to assess and evaluate symptoms and progress in treatment, Medication management and Plan Patient is currently not even able to engage in a conversation about discharge which for now at least puts discharge planning completely off the table.  I am pleased that she has been taking her blood pressure and psychiatric medicine at least for the last day although she does have a history of noncompliance.  I have clarified in the orders that she should be given IM Zyprexa if she refuses the oral and I have increased the oral to 10 mg twice a day.  I am hoping that we can get at least enough lucidity that we can have a rational conversation about other issues such as her blood pressure.  Suella Broad, FNP 02/04/2019, 12:15 PM

## 2019-02-05 MED ORDER — CARVEDILOL 25 MG PO TABS: 25.0000 mg | ORAL_TABLET | Freq: Two times a day (BID) | ORAL | 1 refills | Status: DC

## 2019-02-05 MED ORDER — HYDRALAZINE HCL 50 MG PO TABS: 50.0000 mg | ORAL_TABLET | Freq: Three times a day (TID) | ORAL | 1 refills | Status: DC

## 2019-02-05 MED ORDER — ISOSORBIDE MONONITRATE ER 30 MG PO TB24: 30.0000 mg | ORAL_TABLET | Freq: Every day | ORAL | 1 refills | Status: DC

## 2019-02-05 MED ORDER — OLANZAPINE 10 MG PO TABS: 10.0000 mg | ORAL_TABLET | Freq: Two times a day (BID) | ORAL | 1 refills | Status: DC

## 2019-02-05 NOTE — Progress Notes (Signed)
Recreation Therapy Notes  INPATIENT RECREATION TR PLAN  Patient Details Name: Erin Riley MRN: [id number] DOB: Sep 19, 1950 Today's Date: 02/05/2019  Rec Therapy Plan Is patient appropriate for Therapeutic Recreation?: Yes Treatment times per week: at least 3 Estimated Length of Stay: 5-7 days TR Treatment/Interventions: Group participation (Comment)  Discharge Criteria Pt will be discharged from therapy if:: Discharged Treatment plan/goals/alternatives discussed and agreed upon by:: Patient/family  Discharge Summary Short term goals set: Patient will engage in groups without prompting or encouragement from LRT x3 group sessions within 5 recreation therapy group sessions Short term goals met: Not met Reason goals not met: Patient did not attend any groups Therapeutic equipment acquired: N/A Reason patient discharged from therapy: Discharge from hospital Pt/family agrees with progress & goals achieved: Yes Date patient discharged from therapy: 02/05/19   Marycruz Boehner 02/05/2019, 11:33 AM

## 2019-02-05 NOTE — [facility] Suicide Risk Assessment (Signed)
[facility] Discharge Suicide Risk Assessment   Principal Problem: Schizophrenia [facility]) Discharge Diagnoses: Principal Problem:   Schizophrenia ([place]) Active Problems:   Essential hypertension   Obesity (BMI 30-39.9)   Hypertensive hypertrophic cardiomyopathy: NYHA class II:     Total Time spent with patient: 30 minutes  Musculoskeletal: Strength & Muscle Tone: within normal limits Gait & Station: normal Patient leans: N/A  Psychiatric Specialty Exam: Review of Systems  Constitutional: Negative.   HENT: Negative.   Eyes: Negative.   Respiratory: Negative.   Cardiovascular: Negative.   Gastrointestinal: Negative.   Musculoskeletal: Negative.   Skin: Negative.   Neurological: Negative.   Psychiatric/Behavioral: Negative.     Blood pressure (!) 181/83, pulse 77, temperature 97.8 F (36.6 C), resp. rate 18, height 5\' 7"  (1.702 m), weight 116.1 kg, last menstrual period 05/10/2013, SpO2 96 %.Body mass index is 40.1 kg/m.  General Appearance: Casual  Eye Contact::  Fair  Speech:  Clear and [id number]  Volume:  Normal  Mood:  Euthymic  Affect:  Constricted  Thought Process:  Coherent  Orientation:  Full (Time, Place, and Person)  Thought Content:  Rumination  Suicidal Thoughts:  No  Homicidal Thoughts:  No  Memory:  Immediate;   Fair Recent;   Fair Remote;   Fair  Judgement:  Fair  Insight:  Fair  Psychomotor Activity:  Normal  Concentration:  Fair  Recall:  [employer] of Knowledge:Fair  Language: Fair  Akathisia:  No  Handed:  Right  AIMS (if indicated):     Assets:  Desire for Improvement Housing  Sleep:  Number of Hours: 6.75  Cognition: WNL  ADL's:  Intact   Mental Status Per Nursing Assessment::   On Admission:  NA  Demographic Factors:  Age 68 or older and Living alone  Loss Factors: Decline in physical health  Historical Factors: NA  Risk Reduction Factors:   Religious beliefs about death  Continued Clinical Symptoms:  Schizophrenia:   Paranoid  or undifferentiated type  Cognitive Features That Contribute To Risk:  None    Suicide Risk:  Minimal: No identifiable suicidal ideation.  Patients presenting with no risk factors but with morbid ruminations; may be classified as minimal risk based on the severity of the depressive symptoms    Plan Of Care/Follow-up recommendations:  Activity:  Activity as tolerated Diet:  Regular diet Other:  Follow-up with outpatient mental health treatment continue psychiatric medicine.  Alethia Berthold, MD 02/05/2019, 9:25 AM

## 2019-02-05 NOTE — Progress Notes (Signed)
D: Patient continues to accept her medication. Fidgets whenever her blood pressure is taken and it is difficult to get her to be still long enough to get a reading. Denies SI and HI. Continues to [employer] delusional paranoid statements about someone named Malachy Mood A: Continue to monitor for safety R: Safety maintained.

## 2019-02-05 NOTE — B[facility] Group Notes (Signed)
Emotional Regulation 02/05/2019 1PM  Type of Therapy/Topic:  Group Therapy:  Emotion Regulation  Participation Level:  Did Not Attend   Description of Group:   The purpose of this group is to assist patients in learning to regulate negative emotions and experience positive emotions. Patients will be guided to discuss ways in which they have been vulnerable to their negative emotions. These vulnerabilities will be juxtaposed with experiences of positive emotions or situations, and patients will be challenged to use positive emotions to combat negative ones. Special emphasis will be placed on coping with negative emotions in conflict situations, and patients will process healthy conflict resolution skills.  Therapeutic Goals: 1. Patient will identify two positive emotions or experiences to reflect on in order to balance out negative emotions 2. Patient will label two or more emotions that they find the most difficult to experience 3. Patient will demonstrate positive conflict resolution skills through discussion and/or role plays  Summary of Patient Progress:       Therapeutic Modalities:   Cognitive Behavioral Therapy Feelings Identification Dialectical Behavioral Therapy   Yvette Rack, LCSW 02/05/2019 2:17 PM

## 2019-02-05 NOTE — BHH Counselor (Signed)
CSW met with pt to inform her she is being discharged today and discuss discharge plan. Pt declines any follow up care at this time. CSW provided her with a list of mental health providers should she change her mind. Pt says her vehicle is located at [facility] ED and needs assistance getting there.

## 2019-02-05 NOTE — Plan of Care (Signed)
  Problem: Activity: Goal: Interest or engagement in activities will improve 02/05/2019 0501 by Libby Maw, RN Outcome: Progressing 02/05/2019 0501 by Libby Maw, RN Outcome: Not Progressing Goal: Sleeping patterns will improve 02/05/2019 0501 by Libby Maw, RN Outcome: Progressing 02/05/2019 0501 by Libby Maw, RN Outcome: Not Progressing  D: Patient continues to accept her medication. Fidgets whenever her blood pressure is taken and it is difficult to get her to be still long enough to get a reading. Denies SI and HI. Continues to [employer] delusional paranoid statements about someone named Malachy Mood A: Continue to monitor for safety R: Safety maintained.

## 2019-02-05 NOTE — Discharge Summary (Signed)
Physician Discharge Summary Note  Patient:  Erin Riley is an 68 y.o., female MRN:  [id number] DOB:  [birth year] Patient phone:  [phone] (home)  Patient address:   [address] [place] [address],  Total Time spent with patient: 30 minutes  Date of Admission:  01/31/2019 Date of Discharge: 02/05/19  Reason for Admission:  68yo F with past psych history of Schizoplhrenia, who was admitted to inpatient [facility] unit with worsened psychosis in settings of possible medication non-compliance  Principal Problem: Schizophrenia [facility]) Discharge Diagnoses: Principal Problem:   Schizophrenia (HCC) Active Problems:   Essential hypertension   Obesity (BMI 30-39.9)   Hypertensive hypertrophic cardiomyopathy: NYHA class II:     Past Psychiatric History: Per Dr. Toni Amend evaluation on 09/13/18: "Patient has been admitted to psychiatric hospital several times previously. In 2019 she was at behavioral [facility]. Eventually stabilized on Zyprexa although it sounds like even at her best her baseline continued to be somewhat disorganized. Since then she has been to the emergency room several times usually for medical complaints and on at least a couple of those has been referred to psychiatric hospitals. No known history of suicide attempts or violence. Looks like Zyprexa has been used on several occasions with some benefit. Patient not willing to discuss any outpatient treatment not clear whether she goes for any."  Past Medical History:  Past Medical History:  Diagnosis Date  . Chronic back pain   . Chronic kidney disease (CKD), stage II (mild)    Class I-II  . Coronary artery disease 04/2009   50% stenosis in the perforator of LAD; catheterization was for an abnormal Myoview in January 2000 showing anterior and inferolateral ischemia.  . Diverticulitis   . 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 > 3 subsequent Echos show improved/normal EF with moderate to severe concentric LVH and diastolic dysfunction with LVOT/intracavitary gradient --> 06/2016: Severe LVH.  Vigorous EF, 65-70%.?? Gr 1 DD. Mild AS.  [name] Hyperlipidemia   . Hypertension   . Hypertensive hypertrophic cardiomyopathy: NYHA class II:  Echo: Severe concentric LVH with LV OT gradient; essentially preserved EF with diastolic dysfunction 02/15/2013   Echo 06/2016: Severe Concentric LVH. Vigorous EF 65-70%. ~ Gr I DD.   . Mild aortic stenosis by prior echocardiography    Echo 06/2016: Mild AS (Mean Gradient 15 mmHg); has had prior Mod-Severe MR (not seen on current echo)  . PAD (peripheral artery disease) [facility]) March 2013   Lower extremity Dopplers: R. SFA 50-60%, R. PTA proximally occluded with distal reconstitution;; L. common iliac ~50%, L. SFA 50-70% stenosis, L. PTA < 50%  . Schizophrenia [facility])     Past Surgical History:  Procedure Laterality Date  . BUNIONECTOMY    . carotid doppler  05/29/2011   left bulb/prox ICA moderate amtfibrous plaque with no evidence significant reduction.,right bulb /proximal ICA normal patency  . lower extremity doppler  05/29/2011   right SFA 50% to 59% diameter reduction,right posterior tibal atreery occlusive disease,reconstituting distally, left common illiac<50%,left SFA 50 to70%,left post. tibial <50%  . NM MYOCAR PERF WALL MOTION  03/2009   Persantine; EF 51%-both anterior and inferolateral ischemia  . TRANSTHORACIC ECHOCARDIOGRAM  06/2016   Severe LVH.  Vigorous EF of 65-70%.  No RWMA. ~Only grade 1 diastolic dysfunction.  Mild aortic stenosis (mean gradient 15 mmHg)  . TRANSTHORACIC ECHOCARDIOGRAM  07/2012   EF 50-55%;  severe concentric LVH; only grade 1 diastolic dysfunction. Mild aortic sclerosis - with LVOT /intracavitary gradient of roughly 20 mmHg mean. Mild to moderately dilated LA;; previously reported MR not seen   Family History:  Family History  Problem  Relation Age of Onset  . Hypertension Mother   . Breast cancer Neg Hx    Family Psychiatric  History: Unknown Social History:  Social History   Substance and Sexual Activity  Alcohol Use No     Social History   Substance and Sexual Activity  Drug Use No    Social History   Socioeconomic History  . Marital status: Single    Spouse name: Not on file  . Number of children: 7  . Years of education: Not on file  . Highest education level: Not on file  Occupational History  . Not on file  Social Needs  . Financial resource strain: Not on file  . Food insecurity    Worry: Not on file    Inability: Not on file  . Transportation needs    Medical: Not on file    Non-medical: Not on file  Tobacco Use  . Smoking status: Former Smoker    Types: Cigarettes    Quit date: 05/14/2002    Years since quitting: 16.7  . Smokeless tobacco: Never Used  Substance and Sexual Activity  . Alcohol use: No  . Drug use: No  . Sexual activity: Yes    Birth control/protection: Post-menopausal  Lifestyle  . Physical activity    Days per week: Not on file    Minutes per session: Not on file  . Stress: Not on file  Relationships  . Social Herbalist on phone: Not on file    Gets together: Not on file    Attends religious service: Not on file    Active member of club or organization: Not on file    Attends meetings of clubs or organizations: Not on file    Relationship status: Not on file  Other Topics Concern  . Not on file  Social History Narrative   Now single mother of 2 with one grandchild. She quit smoking roughly 5 years ago and is not so since. She has also stopped drinking alcohol. She does try get routine exercise walking at least a mile 3-4 days a week.    She lives with her 25 year old mother. 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    Hospital Course:  Patient remained on the [facility] unit for 4 days. The patient stabilized on medication and therapy. Patient was discharged on  Coreg 25 mg p.o. twice daily, Pepcid 20 mg p.o. nightly, hydralazine 50 mg p.o. every 8 hours, Imdur 30 mg p.o. daily, Zyprexa 10 mg p.o. twice daily. Patient has shown improvement with improved mood, affect, sleep, appetite, and interaction. Patient has attended group and participated. Patient has been seen in the day room interacting with peers and staff appropriately. Patient denies any SI/HI/AVH and contracts for safety. Patient refuses follow up. Patient is provided with prescriptions for their medications upon discharge.   Physical Findings: AIMS:  , ,  ,  ,    CIWA:    COWS:     Musculoskeletal: Strength & Muscle Tone: within normal limits Gait & Station: normal Patient leans: N/A  Psychiatric Specialty Exam: Physical Exam  Nursing note and vitals reviewed. Constitutional: She is oriented to person, place, and time. She appears well-developed and well-nourished.  Cardiovascular: Normal rate.  Respiratory: Effort normal.  Musculoskeletal: Normal range of motion.  Neurological: She is alert and oriented to person, place, and time.  Skin: Skin is warm.    Review of Systems  Constitutional: Negative.   HENT: Negative.   Eyes: Negative.   Respiratory: Negative.   Cardiovascular: Negative.   Gastrointestinal: Negative.   Genitourinary: Negative.   Musculoskeletal: Negative.   Skin: Negative.   Neurological: Negative.   Endo/Heme/Allergies: Negative.   Psychiatric/Behavioral: Negative.     Blood pressure (!) 181/83, pulse 77, temperature 97.8 F (36.6 C), resp. rate 18, height 5\' 7"  (1.702 m), weight 116.1 kg, last menstrual period 05/10/2013, SpO2 96 %.Body mass index is 40.1 kg/m.   General Appearance: Casual  Eye Contact::  Fair  Speech:  Clear and Coherent409  Volume:  Normal  Mood:  Euthymic  Affect:  Constricted  Thought Process:  Coherent  Orientation:  Full (Time, Place, and Person)  Thought Content:  Rumination  Suicidal Thoughts:  No  Homicidal Thoughts:   No  Memory:  Immediate;   Fair Recent;   Fair Remote;   Fair  Judgement:  Fair  Insight:  Fair  Psychomotor Activity:  Normal  Concentration:  Fair  Recall:  Fair  Fund of Knowledge:Fair  Language: Fair  Akathisia:  No  Handed:  Right  AIMS (if indicated):     Assets:  Desire for Improvement Housing  Sleep:  Number of Hours: 6.75  Cognition: WNL  ADL's:  Intact      Has this patient used any form of tobacco in the last 30 days? (Cigarettes, Smokeless Tobacco, Cigars, and/or Pipes) Yes, No  Blood Alcohol level:  Lab Results  Component Value Date   ETH <10 01/30/2019   ETH <10 01/17/2019    Metabolic Disorder Labs:  Lab Results  Component Value Date   HGBA1C 5.9 (H) 06/09/2016   MPG 123 06/09/2016   MPG 105 07/31/2012   Lab Results  Component Value Date   PROLACTIN 16.5 06/09/2016   Lab Results  Component Value Date   CHOL 123 08/31/2017   TRIG 120 08/31/2017   HDL 40 08/31/2017   CHOLHDL 3.1 08/31/2017   VLDL 55 (H) 06/09/2016   LDLCALC 59 08/31/2017   LDLCALC 80 06/09/2016    See Psychiatric Specialty Exam and Suicide Risk Assessment completed by Attending Physician prior to discharge.  Discharge destination:  Home  Is patient on multiple antipsychotic therapies at discharge:  No   Has Patient had three or more failed trials of antipsychotic monotherapy by history:  No  Recommended Plan for Multiple Antipsychotic Therapies: NA  Discharge Instructions    Diet - low sodium heart healthy   Complete by: As directed    Increase activity slowly   Complete by: As directed      Allergies as of 02/05/2019   No Known Allergies     Medication List    STOP taking these medications   cephALEXin 500 MG capsule Commonly known as: KEFLEX     TAKE these medications     Indication  carvedilol 25 MG tablet Commonly known as: COREG Take 1 tablet (25 mg total) by mouth 2 (two) times daily with a meal.  Indication: Per PCP   famotidine 20 MG  tablet Commonly known as: PEPCID Take 1 tablet (20 mg total) by mouth at bedtime.  Indication: Gastroesophageal Reflux Disease   hydrALAZINE 50 MG tablet Commonly known as: APRESOLINE Take 1 tablet (50 mg total) by mouth every 8 (eight) hours. What changed:  medication strength  how much to take  Indication: Per PCP   isosorbide mononitrate 30 MG 24 hr tablet Commonly known as: IMDUR Take 1 tablet (30 mg total) by mouth daily. Start taking on: February 06, 2019 What changed: how much to take  Indication: Per PCP   OLANZapine 10 MG tablet Commonly known as: ZYPREXA Take 1 tablet (10 mg total) by mouth 2 (two) times daily. What changed:   medication strength  how much to take  when to take this  Another medication with the same name was removed. Continue taking this medication, and follow the directions you see here.  Indication: Schizophrenia        Follow-up recommendations:  Continue activity as tolerated. Continue diet as recommended by your PCP. Ensure to keep all appointments with outpatient providers.  Comments:  Patient is instructed prior to discharge to: Take all medications as prescribed by his/her mental healthcare provider. Report any adverse effects and or reactions from the medicines to his/her outpatient provider promptly. Patient has been instructed & cautioned: To not engage in alcohol and or illegal drug use while on prescription medicines. In the event of worsening symptoms, patient is instructed to call the crisis hotline, 911 and or go to the nearest ED for appropriate evaluation and treatment of symptoms. To follow-up with his/her primary care provider for your other medical issues, concerns and or health care needs.    Signed: Gerlene Burdock Letitia Sabala, FNP 02/05/2019, 11:51 AM

## 2019-02-05 NOTE — Plan of Care (Signed)
  Problem: Group Participation Goal: STG - Patient will engage in groups without prompting or encouragement from LRT x3 group sessions within 5 recreation therapy group sessions Description: STG - Patient will engage in groups without prompting or encouragement from LRT x3 group sessions within 5 recreation therapy group sessions 02/05/2019 1131 by Ernest Haber, LRT Outcome: Not Applicable 28/05/1515 [id number] by Ernest Haber, LRT Outcome: Not Met (add Reason) Note: Patient did not attend any groups.

## 2019-02-05 NOTE — Progress Notes (Signed)
Recreation Therapy Notes  Date: 02/05/2019  Time: 9:30 am   Location: Craft room   Behavioral response: N/A   Intervention Topic: Coping skills   Discussion/Intervention: Patient did not attend group.   Clinical Observations/Feedback:  Patient did not attend group.   [name] LRT/CTRS        Erin Riley 02/05/2019 10:31 AM 

## 2019-02-05 NOTE — Plan of Care (Signed)
Pt denies depression, anxiety, SI, HI and AVH. Pt was educated on care plan and verbalizes understanding. Jomarie Gellis RN Problem: Education: Goal: Knowledge of [facility] Education information/materials will improve Outcome: Adequate for Discharge Goal: Emotional status will improve Outcome: Adequate for Discharge Goal: Mental status will improve Outcome: Adequate for Discharge Goal: Verbalization of understanding the information provided will improve Outcome: Adequate for Discharge   Problem: Activity: Goal: Interest or engagement in activities will improve Outcome: Adequate for Discharge Goal: Sleeping patterns will improve Outcome: Adequate for Discharge   Problem: Coping: Goal: Ability to verbalize frustrations and anger appropriately will improve Outcome: Adequate for Discharge Goal: Ability to demonstrate self-control will improve Outcome: Adequate for Discharge   Problem: Health Behavior/Discharge Planning: Goal: Identification of resources available to assist in meeting health care needs will improve Outcome: Adequate for Discharge Goal: Compliance with treatment plan for underlying cause of condition will improve Outcome: Adequate for Discharge   Problem: Physical Regulation: Goal: Ability to maintain clinical measurements within normal limits will improve Outcome: Adequate for Discharge   Problem: Safety: Goal: Periods of time without injury will increase Outcome: Adequate for Discharge   Problem: Activity: Goal: Will verbalize the importance of balancing activity with adequate rest periods Outcome: Adequate for Discharge   Problem: Education: Goal: Will be free of psychotic symptoms Outcome: Adequate for Discharge Goal: Knowledge of the prescribed therapeutic regimen will improve Outcome: Adequate for Discharge   Problem: Coping: Goal: Coping ability will improve Outcome: Adequate for Discharge Goal: Will verbalize feelings Outcome: Adequate for  Discharge   Problem: Health Behavior/Discharge Planning: Goal: Compliance with prescribed medication regimen will improve Outcome: Adequate for Discharge   Problem: Nutritional: Goal: Ability to achieve adequate nutritional intake will improve Outcome: Adequate for Discharge   Problem: Role Relationship: Goal: Ability to communicate needs accurately will improve Outcome: Adequate for Discharge Goal: Ability to interact with others will improve Outcome: Adequate for Discharge   Problem: Safety: Goal: Ability to redirect hostility and anger into socially appropriate behaviors will improve Outcome: Adequate for Discharge Goal: Ability to remain free from injury will improve Outcome: Adequate for Discharge   Problem: Self-Care: Goal: Ability to participate in self-care as condition permits will improve Outcome: Adequate for Discharge   Problem: Self-Concept: Goal: Will verbalize positive feelings about self Outcome: Adequate for Discharge   

## 2019-02-05 NOTE — Progress Notes (Signed)
Pt denies SI, HI and AVH. Pt was given belongings, dc packet and prescriptions. Pt was educated on dc plan and verbalizes understanding. Collier Bullock RN

## 2019-02-07 ENCOUNTER — Encounter: Payer: Self-pay | Admitting: General Practice

## 2019-02-07 ENCOUNTER — Ambulatory Visit (INDEPENDENT_AMBULATORY_CARE_PROVIDER_SITE_OTHER): Payer: Medicare Other | Admitting: General Practice

## 2019-02-07 ENCOUNTER — Other Ambulatory Visit: Payer: Self-pay

## 2019-02-07 VITALS — BP 168/112 | HR 96 | Ht 67.0 in | Wt 260.8 lb

## 2019-02-07 DIAGNOSIS — I421 Obstructive hypertrophic cardiomyopathy: Secondary | ICD-10-CM

## 2019-02-07 DIAGNOSIS — I10 Essential (primary) hypertension: Secondary | ICD-10-CM | POA: Diagnosis not present

## 2019-02-07 DIAGNOSIS — I5189 Other ill-defined heart diseases: Secondary | ICD-10-CM

## 2019-02-07 DIAGNOSIS — I2510 Atherosclerotic heart disease of native coronary artery without angina pectoris: Secondary | ICD-10-CM | POA: Diagnosis not present

## 2019-02-07 DIAGNOSIS — I771 Stricture of artery: Secondary | ICD-10-CM | POA: Diagnosis not present

## 2019-02-07 DIAGNOSIS — I519 Heart disease, unspecified: Secondary | ICD-10-CM
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
## 2019-02-07 NOTE — Patient Instructions (Signed)
TAKE YOUR MEDICATIONS DAILY AS DIRECTED  Follow-Up: IN 6 months Please call our office 2 months in advance, [facility] 2021 to schedule this June 2021 appointment. In Person You may see Glenetta Hew, MD Coletta Memos, FNP or one of the following Advanced Practice Providers on your designated Care Team:  Rosaria Ferries, PA-C Jory Sims, DNP, ANP  Cadence Kathlen Mody, NP.    Medication Instructions:  The current medical regimen is effective;  continue present plan and medications as directed. Please refer to the Current Medication list given to you today. If you need a refill on your cardiac medications before your next appointment, please call your pharmacy.  At [facility], you and your health needs are our priority.  As part of our continuing mission to provide you with exceptional heart care, we have created designated Provider Care Teams.  These Care Teams include your primary Cardiologist (physician) and Advanced Practice Providers (APPs -  Physician Assistants and Nurse Practitioners) who all work together to provide you with the care you need, when you need it.  Thank you for choosing [facility] at [facility]!!         Happy Holidays!!

## 2019-07-29 NOTE — Progress Notes (Signed)
Error.  Patient became angry and left the exam room.

## 2019-07-30 ENCOUNTER — Encounter: Payer: Medicare Other | Admitting: Adult Health

## 2019-07-30 ENCOUNTER — Encounter: Payer: Self-pay | Admitting: Adult Health

## 2019-07-30 ENCOUNTER — Other Ambulatory Visit: Payer: Self-pay

## 2019-08-07 ENCOUNTER — Emergency Department (HOSPITAL_COMMUNITY)
Admission: EM | Admit: 2019-08-07 | Discharge: 2019-08-09 | Disposition: A | Discharge status: Other Acute Inpatient Hospital | Payer: Medicare Other | Attending: Emergency Medicine | Admitting: Emergency Medicine

## 2019-08-07 ENCOUNTER — Other Ambulatory Visit: Payer: Self-pay

## 2019-08-07 ENCOUNTER — Encounter (HOSPITAL_COMMUNITY): Payer: Self-pay | Admitting: Emergency Medicine

## 2019-08-07 DIAGNOSIS — I129 Hypertensive chronic kidney disease with stage 1 through stage 4 chronic kidney disease, or unspecified chronic kidney disease: Secondary | ICD-10-CM | POA: Diagnosis not present

## 2019-08-07 DIAGNOSIS — F22 Delusional disorders: Secondary | ICD-10-CM

## 2019-08-07 DIAGNOSIS — Z20822 Contact with and (suspected) exposure to covid-19: Secondary | ICD-10-CM | POA: Insufficient documentation

## 2019-08-07 DIAGNOSIS — N182 Chronic kidney disease, stage 2 (mild): Secondary | ICD-10-CM | POA: Insufficient documentation

## 2019-08-07 DIAGNOSIS — I2510 Atherosclerotic heart disease of native coronary artery without angina pectoris: Secondary | ICD-10-CM | POA: Insufficient documentation

## 2019-08-07 DIAGNOSIS — Z87891 Personal history of nicotine dependence: Secondary | ICD-10-CM | POA: Diagnosis not present

## 2019-08-07 DIAGNOSIS — F200 Paranoid schizophrenia: Secondary | ICD-10-CM | POA: Diagnosis present

## 2019-08-07 DIAGNOSIS — Z008 Encounter for other general examination: Secondary | ICD-10-CM

## 2019-08-07 LAB — URINALYSIS, ROUTINE W REFLEX MICROSCOPIC
Bilirubin Urine: NEGATIVE
Glucose, UA: NEGATIVE mg/dL
Hgb urine dipstick: NEGATIVE
Ketones, ur: NEGATIVE mg/dL
Nitrite: NEGATIVE
Protein, ur: 100 mg/dL — AB
Specific Gravity, Urine: 1.011 (ref 1.005–1.030)
pH: 7 (ref 5.0–8.0)

## 2019-08-07 LAB — CBC WITH DIFFERENTIAL/PLATELET
Abs Immature Granulocytes: 0.01 10*3/uL (ref 0.00–0.07)
Basophils Absolute: 0 10*3/uL (ref 0.0–0.1)
Basophils Relative: 0 %
Eosinophils Absolute: 0.2 10*3/uL (ref 0.0–0.5)
Eosinophils Relative: 3 %
HCT: 39.7 % (ref 36.0–46.0)
Hemoglobin: 12.6 g/dL (ref 12.0–15.0)
Immature Granulocytes: 0 %
Lymphocytes Relative: 28 %
Lymphs Abs: 1.6 10*3/uL (ref 0.7–4.0)
MCH: 28.8 pg (ref 26.0–34.0)
MCHC: 31.7 g/dL (ref 30.0–36.0)
MCV: 90.8 fL (ref 80.0–100.0)
Monocytes Absolute: 0.4 10*3/uL (ref 0.1–1.0)
Monocytes Relative: 7 %
Neutro Abs: 3.5 10*3/uL (ref 1.7–7.7)
Neutrophils Relative %: 62 %
Platelets: 314 10*3/uL (ref 150–400)
RBC: 4.37 MIL/uL (ref 3.87–5.11)
RDW: 13.7 % (ref 11.5–15.5)
WBC: 5.6 10*3/uL (ref 4.0–10.5)
nRBC: 0 % (ref 0.0–0.2)

## 2019-08-07 LAB — COMPREHENSIVE METABOLIC PANEL
ALT: 11 U/L (ref 0–44)
AST: 16 U/L (ref 15–41)
Albumin: 3.8 g/dL (ref 3.5–5.0)
Alkaline Phosphatase: 83 U/L (ref 38–126)
Anion gap: 8 (ref 5–15)
BUN: 15 mg/dL (ref 8–23)
CO2: 26 mmol/L (ref 22–32)
Calcium: 9.4 mg/dL (ref 8.9–10.3)
Chloride: 103 mmol/L (ref 98–111)
Creatinine, Ser: 1.47 mg/dL — ABNORMAL HIGH (ref 0.44–1.00)
GFR calc Af Amer: 42 mL/min — ABNORMAL LOW (ref >60)
GFR calc non Af Amer: 36 mL/min — ABNORMAL LOW (ref >60)
Glucose, Bld: 109 mg/dL — ABNORMAL HIGH (ref 70–99)
Potassium: 4.4 mmol/L (ref 3.5–5.1)
Sodium: 137 mmol/L (ref 135–145)
Total Bilirubin: 0.7 mg/dL (ref 0.3–1.2)
Total Protein: 7.7 g/dL (ref 6.5–8.1)

## 2019-08-07 LAB — RAPID URINE DRUG SCREEN, HOSP PERFORMED
Amphetamines: NOT DETECTED
Barbiturates: NOT DETECTED
Benzodiazepines: NOT DETECTED
Cocaine: NOT DETECTED
Opiates: NOT DETECTED
Tetrahydrocannabinol: NOT DETECTED

## 2019-08-07 LAB — TROPONIN I (HIGH SENSITIVITY)
Troponin I (High Sensitivity): 23 ng/L — ABNORMAL HIGH (ref <18)
Troponin I (High Sensitivity): 22 ng/L — ABNORMAL HIGH (ref <18)

## 2019-08-07 LAB — ETHANOL: Alcohol, Ethyl (B): <10 mg/dL (ref <10)

## 2019-08-07 LAB — SARS CORONAVIRUS 2 BY RT PCR (HOSPITAL ORDER, PERFORMED IN ~~LOC~~ HOSPITAL LAB): SARS Coronavirus 2: NEGATIVE

## 2019-08-07 MED ORDER — ZIPRASIDONE MESYLATE 20 MG IM SOLR
20.0000 mg | Freq: Once | INTRAMUSCULAR | Status: AC
  Administered 2019-08-07: 20 mg via INTRAMUSCULAR

## 2019-08-07 MED ORDER — FAMOTIDINE 20 MG PO TABS
20.0000 mg | ORAL_TABLET | Freq: Every day | ORAL | Status: DC
  Administered 2019-08-07 – 2019-08-08 (×2): 20 mg via ORAL
  Filled 2019-08-07 (×3): qty 1

## 2019-08-07 MED ORDER — ZIPRASIDONE MESYLATE 20 MG IM SOLR
INTRAMUSCULAR | Status: AC
  Filled 2019-08-07: qty 20

## 2019-08-07 MED ORDER — FAMOTIDINE 20 MG PO TABS: 20.0000 mg | ORAL_TABLET | Freq: Every day | ORAL | Status: DC

## 2019-08-07 MED ORDER — ACETAMINOPHEN 325 MG PO TABS: 650.0000 mg | ORAL_TABLET | ORAL | Status: DC | PRN

## 2019-08-07 MED ORDER — ONDANSETRON HCL 4 MG PO TABS: 4.0000 mg | ORAL_TABLET | Freq: Three times a day (TID) | ORAL | Status: DC | PRN

## 2019-08-07 MED ORDER — STERILE WATER FOR INJECTION IJ SOLN
INTRAMUSCULAR | Status: AC
  Administered 2019-08-07: 1.2 mL
  Filled 2019-08-07: qty 10

## 2019-08-07 MED ORDER — OLANZAPINE 10 MG PO TABS
10.0000 mg | ORAL_TABLET | Freq: Two times a day (BID) | ORAL | Status: DC
  Administered 2019-08-07 – 2019-08-09 (×5): 10 mg via ORAL
  Filled 2019-08-07 (×5): qty 1

## 2019-08-07 MED ORDER — ISOSORBIDE MONONITRATE ER 30 MG PO TB24
30.0000 mg | ORAL_TABLET | Freq: Every day | ORAL | Status: DC
  Administered 2019-08-07: 30 mg via ORAL
  Filled 2019-08-07: qty 1

## 2019-08-07 MED ORDER — ISOSORBIDE MONONITRATE ER 30 MG PO TB24: 30.0000 mg | ORAL_TABLET | Freq: Every day | ORAL | Status: DC

## 2019-08-07 MED ORDER — CARVEDILOL 12.5 MG PO TABS
25.0000 mg | ORAL_TABLET | Freq: Two times a day (BID) | ORAL | Status: DC
  Administered 2019-08-07 – 2019-08-09 (×5): 25 mg via ORAL
  Filled 2019-08-07 (×5): qty 2

## 2019-08-07 MED ORDER — CARVEDILOL 12.5 MG PO TABS: 25.0000 mg | ORAL_TABLET | Freq: Two times a day (BID) | ORAL | Status: DC

## 2019-08-07 NOTE — ED Notes (Signed)
Spoke with BH, it was state that TTS consult was completed at 1 pm and the recommendation for Geri-Psych was made.

## 2019-08-07 NOTE — ED Notes (Signed)
Pt refuses COVID swab and medication at this time stating " go on out of here I am about to get up and leave go to another hospital" Waiting for [facility] representative to finish before second attempt

## 2019-08-07 NOTE — ED Notes (Signed)
Pt currently asleep and resting comfortably 
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
## 2019-08-07 NOTE — ED Notes (Signed)
Asked nurse secretary to call TTS to see when consult can take place.

## 2019-08-07 NOTE — Care Management (Signed)
Writer referred to the following facilities:     [facility] Details  Fax        [address] [place] [place] [address]    Internal comment    [facility] Details  Fax        [address], [place] [address]    Internal comment    [facility] Details  Fax        [address] [place] [address]    Internal comment    [facility] Details  Fax        [address] [place], [place] [address]    Internal comment    [facility] Details  Fax        [address] [place], [place] [address]    Internal comment    [facility] Details  Fax        [name] [place] [address]    Internal comment    [facility] Details  Fax        [address], [place] [address]    Internal comment    [facility] Details  Fax        [address] [place], [place] [address]    Internal comment

## 2019-08-07 NOTE — ED Notes (Signed)
Pt refused Covid swab and medication at this time. Trifan MD called to bedside. Pt states " I don't want yall in here everyone keeping coming up showing me their private parts and shit just go on get out of here and let me go home".   Security at bedside

## 2019-08-07 NOTE — BHH Counselor (Signed)
Disposition: Per Ophelia Shoulder, PMHNP patient meets in patient geri-psych criteria. TTS to seek placement.

## 2019-08-07 NOTE — ED Notes (Signed)
Per MD IVC paperwork has been started

## 2019-08-07 NOTE — ED Notes (Signed)
Provided pt with sandwich, water, and ginger ale. Pt is currently eating and calm
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
## 2019-08-07 NOTE — ED Notes (Signed)
Pt undressed and placed in burgundy scrubs. Belongings at nurses station. Security called to wand pt
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
## 2019-08-07 NOTE — ED Notes (Signed)
BH rep at bedisde

## 2019-08-07 NOTE — ED Notes (Signed)
Pt request X3 to have IV taken out. Pt picked at dressing and this RN removed IV.

## 2019-08-08 ENCOUNTER — Emergency Department (HOSPITAL_COMMUNITY): Payer: Medicare Other

## 2019-08-08 DIAGNOSIS — F200 Paranoid schizophrenia: Secondary | ICD-10-CM | POA: Diagnosis not present

## 2019-08-08 LAB — URINE CULTURE: Culture: <10000 — AB

## 2019-08-08 MED ORDER — ISOSORBIDE MONONITRATE ER 30 MG PO TB24
30.0000 mg | ORAL_TABLET | Freq: Every day | ORAL | Status: DC
  Administered 2019-08-09: 30 mg via ORAL
  Filled 2019-08-08: qty 1

## 2019-08-08 MED ORDER — HYDRALAZINE HCL 25 MG PO TABS
50.0000 mg | ORAL_TABLET | Freq: Three times a day (TID) | ORAL | Status: DC
  Administered 2019-08-09 (×3): 50 mg via ORAL
  Filled 2019-08-08 (×4): qty 2

## 2019-08-08 NOTE — ED Notes (Signed)
Pt ambulating to xray
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
## 2019-08-08 NOTE — ED Notes (Signed)
Pt ambulated to restroom without difficulty

## 2019-08-08 NOTE — ED Notes (Signed)
Theodoro Grist with tele-psych at bedside

## 2019-08-08 NOTE — ED Notes (Signed)
Pt alert. Pt confused. Cooperative. No s/s of distress. Pt medication compliant. Pt ambulates and does ADLs.

## 2019-08-08 NOTE — Progress Notes (Signed)
CSW received a call from [place] stating pt's referral was received but that they are not taking any new patients at this time.  Please reconsult if future social work needs arise.  CSW signing off, as social work intervention is no longer needed.  Erin Riley. Berry Gallacher  MSW, LCSW, LCAS, CCS Transitions of Care Clinical Social Worker Care Coordination Department Ph: [phone]

## 2019-08-08 NOTE — Progress Notes (Signed)
Received Erin Riley at the change of shift asleep in her bed with the sitter at the bedside. She continued to sleep throughout the evening and was awaken for VS assessment and medication administration. This Clinical research associate received a call from [facility] and was informed this patient has been DECLINE for admission. She slept throughout the night.

## 2019-08-09 DIAGNOSIS — F200 Paranoid schizophrenia: Secondary | ICD-10-CM | POA: Diagnosis not present

## 2019-08-09 MED ORDER — CLONIDINE HCL 0.1 MG PO TABS
0.1000 mg | ORAL_TABLET | Freq: Once | ORAL | Status: AC
  Administered 2019-08-09: 0.1 mg via ORAL
  Filled 2019-08-09: qty 1

## 2019-08-09 NOTE — ED Notes (Signed)
Transported to [employer] by [employer]. Belongings returned to [place] except wore her own clothing. Pt did not want to go but was cooperative.

## 2019-08-09 NOTE — BH Assessment (Signed)
Clinician spoke to [facility] in regards to pt; to consider accepting pt, they need updated vitals with a lower BP and they need documentation that pt has been medically cleared.  Spoke to pt's nurse, Dominga Ferry, at [phone] who stated she would get updated vitals for pt and put them in pt's chart.  Spoke to pt's EDP Dr. Elesa Massed at [phone] who stated she would document that pt has been medically cleared.

## 2019-08-09 NOTE — ED Notes (Signed)
Sheriff called to report that they will pick pt up between 2 and 3 pm today.

## 2019-08-09 NOTE — ED Notes (Signed)
Report called to [facility] in [place] to [place] RN and left message with sheriff to transport.

## 2019-08-09 NOTE — BH Assessment (Signed)
Per Kennyth Arnold, patient accepted to [facility] in [place], [place]. The accepting provider is Dr. Sherryle Lis. Nurse report [phone]. Patient's nurse-Dianne made aware of this patient's disposition.

## 2019-09-25 ENCOUNTER — Other Ambulatory Visit: Payer: Self-pay

## 2019-09-25 ENCOUNTER — Emergency Department (HOSPITAL_COMMUNITY)
Admission: EM | Admit: 2019-09-25 | Discharge: 2019-09-25 | Disposition: A | Discharge status: Home / Self Care | Payer: Medicare Other | Attending: Emergency Medicine | Admitting: Emergency Medicine

## 2019-09-25 ENCOUNTER — Encounter (HOSPITAL_COMMUNITY): Payer: Self-pay

## 2019-09-25 DIAGNOSIS — N183 Chronic kidney disease, stage 3 unspecified: Secondary | ICD-10-CM | POA: Diagnosis not present

## 2019-09-25 DIAGNOSIS — Z87891 Personal history of nicotine dependence: Secondary | ICD-10-CM | POA: Diagnosis not present

## 2019-09-25 DIAGNOSIS — I2510 Atherosclerotic heart disease of native coronary artery without angina pectoris: Secondary | ICD-10-CM | POA: Diagnosis not present

## 2019-09-25 DIAGNOSIS — R109 Unspecified abdominal pain: Secondary | ICD-10-CM | POA: Diagnosis present

## 2019-09-25 DIAGNOSIS — R1084 Generalized abdominal pain: Secondary | ICD-10-CM | POA: Insufficient documentation

## 2019-09-25 DIAGNOSIS — I129 Hypertensive chronic kidney disease with stage 1 through stage 4 chronic kidney disease, or unspecified chronic kidney disease: Secondary | ICD-10-CM | POA: Diagnosis not present

## 2019-09-25 LAB — URINALYSIS, ROUTINE W REFLEX MICROSCOPIC
Bilirubin Urine: NEGATIVE
Glucose, UA: NEGATIVE mg/dL
Hgb urine dipstick: NEGATIVE
Ketones, ur: NEGATIVE mg/dL
Nitrite: NEGATIVE
Protein, ur: 100 mg/dL — AB
Specific Gravity, Urine: 1.017 (ref 1.005–1.030)
pH: 5 (ref 5.0–8.0)

## 2019-09-25 LAB — COMPREHENSIVE METABOLIC PANEL
ALT: 11 U/L (ref 0–44)
AST: 16 U/L (ref 15–41)
Albumin: 3.6 g/dL (ref 3.5–5.0)
Alkaline Phosphatase: 79 U/L (ref 38–126)
Anion gap: 12 (ref 5–15)
BUN: 18 mg/dL (ref 8–23)
CO2: 20 mmol/L — ABNORMAL LOW (ref 22–32)
Calcium: 9.4 mg/dL (ref 8.9–10.3)
Chloride: 108 mmol/L (ref 98–111)
Creatinine, Ser: 1.49 mg/dL — ABNORMAL HIGH (ref 0.44–1.00)
GFR calc Af Amer: 41 mL/min — ABNORMAL LOW (ref >60)
GFR calc non Af Amer: 36 mL/min — ABNORMAL LOW (ref >60)
Glucose, Bld: 138 mg/dL — ABNORMAL HIGH (ref 70–99)
Potassium: 3.6 mmol/L (ref 3.5–5.1)
Sodium: 140 mmol/L (ref 135–145)
Total Bilirubin: 0.5 mg/dL (ref 0.3–1.2)
Total Protein: 7.5 g/dL (ref 6.5–8.1)

## 2019-09-25 LAB — CBC
HCT: 40 % (ref 36.0–46.0)
Hemoglobin: 12.3 g/dL (ref 12.0–15.0)
MCH: 28.4 pg (ref 26.0–34.0)
MCHC: 30.8 g/dL (ref 30.0–36.0)
MCV: 92.4 fL (ref 80.0–100.0)
Platelets: 237 10*3/uL (ref 150–400)
RBC: 4.33 MIL/uL (ref 3.87–5.11)
RDW: 14.1 % (ref 11.5–15.5)
WBC: 6.5 10*3/uL (ref 4.0–10.5)
nRBC: 0 % (ref 0.0–0.2)

## 2019-09-25 LAB — LIPASE, BLOOD: Lipase: 29 U/L (ref 11–51)

## 2019-09-25 MED ORDER — CARVEDILOL 3.125 MG PO TABS
25.0000 mg | ORAL_TABLET | Freq: Two times a day (BID) | ORAL | Status: DC
  Filled 2019-09-25: qty 2

## 2019-09-25 MED ORDER — ALUM & MAG HYDROXIDE-SIMETH 200-200-20 MG/5ML PO SUSP
30.0000 mL | Freq: Once | ORAL | Status: AC
  Administered 2019-09-25: 30 mL via ORAL
  Filled 2019-09-25: qty 30

## 2019-09-25 MED ORDER — SODIUM CHLORIDE 0.9% FLUSH: 3.0000 mL | Freq: Once | INTRAVENOUS | Status: DC

## 2019-09-25 MED ORDER — LIDOCAINE VISCOUS HCL 2 % MT SOLN
15.0000 mL | Freq: Once | OROMUCOSAL | Status: AC
  Administered 2019-09-25: 15 mL via ORAL
  Filled 2019-09-25: qty 15

## 2019-09-25 MED ORDER — CARVEDILOL 25 MG PO TABS: 25.0000 mg | ORAL_TABLET | Freq: Two times a day (BID) | ORAL | 1 refills | Status: DC

## 2019-09-25 MED ORDER — ISOSORBIDE MONONITRATE ER 30 MG PO TB24: 30.0000 mg | ORAL_TABLET | Freq: Every day | ORAL | 1 refills | Status: DC

## 2019-09-25 MED ORDER — ISOSORBIDE MONONITRATE ER 30 MG PO TB24
30.0000 mg | ORAL_TABLET | Freq: Every day | ORAL | Status: DC
  Administered 2019-09-25: 30 mg via ORAL
  Filled 2019-09-25: qty 1

## 2019-09-25 NOTE — ED Triage Notes (Signed)
Pt arrives to ED w/ 7/10 back pain, R/LLQ abdominal pain, BLE edema. Pt hypertensive w/ EMS 232/128. Pt denies n/v/d. EMS EKG unremarkable. CBG 155.

## 2019-09-25 NOTE — Discharge Instructions (Addendum)
Your blood pressure was elevated in the ER today, I want you to take your blood pressure medications as prescribed.  I did refill these for you today since you said you were out of them. Follow-up with your primary care in the next couple of days if you do not have a primary care you can call [facility] for an appointment. Please come back to the emergency department for any new or worsening concerning symptoms.
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
## 2019-09-25 NOTE — ED Notes (Addendum)
Pt ambulated to bathroom unassisted before this RN was able to give pt a urine cup for sample
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
## 2019-09-26 ENCOUNTER — Emergency Department (HOSPITAL_COMMUNITY)
Admission: EM | Admit: 2019-09-26 | Discharge: 2019-09-27 | Disposition: A | Discharge status: Home / Self Care | Payer: Medicare Other | Source: Home / Self Care | Attending: Emergency Medicine | Admitting: Emergency Medicine

## 2019-09-26 ENCOUNTER — Emergency Department (HOSPITAL_COMMUNITY)
Admission: EM | Admit: 2019-09-26 | Discharge: 2019-09-26 | Disposition: A | Discharge status: Home / Self Care | Payer: Medicare Other | Attending: Emergency Medicine | Admitting: Emergency Medicine

## 2019-09-26 ENCOUNTER — Encounter (HOSPITAL_COMMUNITY): Payer: Self-pay | Admitting: Emergency Medicine

## 2019-09-26 ENCOUNTER — Other Ambulatory Visit: Payer: Self-pay

## 2019-09-26 DIAGNOSIS — I129 Hypertensive chronic kidney disease with stage 1 through stage 4 chronic kidney disease, or unspecified chronic kidney disease: Secondary | ICD-10-CM | POA: Insufficient documentation

## 2019-09-26 DIAGNOSIS — Z79899 Other long term (current) drug therapy: Secondary | ICD-10-CM | POA: Insufficient documentation

## 2019-09-26 DIAGNOSIS — M79671 Pain in right foot: Secondary | ICD-10-CM

## 2019-09-26 DIAGNOSIS — M79672 Pain in left foot: Secondary | ICD-10-CM | POA: Insufficient documentation

## 2019-09-26 DIAGNOSIS — Z5321 Procedure and treatment not carried out due to patient leaving prior to being seen by health care provider: Secondary | ICD-10-CM | POA: Insufficient documentation

## 2019-09-26 DIAGNOSIS — R1030 Lower abdominal pain, unspecified: Secondary | ICD-10-CM | POA: Diagnosis not present

## 2019-09-26 DIAGNOSIS — Z87891 Personal history of nicotine dependence: Secondary | ICD-10-CM | POA: Insufficient documentation

## 2019-09-26 DIAGNOSIS — M545 Low back pain: Secondary | ICD-10-CM | POA: Insufficient documentation

## 2019-09-26 DIAGNOSIS — N182 Chronic kidney disease, stage 2 (mild): Secondary | ICD-10-CM | POA: Insufficient documentation

## 2019-09-26 DIAGNOSIS — I25110 Atherosclerotic heart disease of native coronary artery with unstable angina pectoris: Secondary | ICD-10-CM | POA: Insufficient documentation

## 2019-09-26 NOTE — ED Notes (Signed)
Pt wearing hospital burgundy scrubs, pt asking for another pair and asking to take a shower. This NT explained that she is not supposed to take these scrubs home, pt stated "the doctor gave me these, he gave me two pair I need another pair now so I can take a shower." This NT explained that she cannot be given another pair of scrubs.

## 2019-09-26 NOTE — ED Triage Notes (Signed)
Per EMS, pt c/o lower back pn and lower abd pain X2.5 hours.  Worse w/ palpitation and ROM  186 pal 102 HR  18RR 99%RA  168 CBG  Seen for same less than 12 hours ago

## 2019-09-26 NOTE — ED Triage Notes (Signed)
Patient reports bilateral feet pain today denies injury, ambulatory .

## 2019-09-27 ENCOUNTER — Emergency Department (HOSPITAL_COMMUNITY)
Admission: EM | Admit: 2019-09-27 | Discharge: 2019-09-27 | Discharge status: Against Medical Advice | Payer: Medicare Other

## 2019-09-27 NOTE — Discharge Instructions (Addendum)
Return for redness, warmth, lesions, fevers in your feet, calf pain or swelling

## 2019-09-27 NOTE — ED Notes (Signed)
All discharge instructions reviewed with pt, who verbalized understanding. Pt expressed desire to stay in bed to sleep until the bus came in the morning. I advised her that this was not an option as we need beds for pts with extended waits in lobby. Pt discharged without issue.

## 2019-09-27 NOTE — ED Notes (Signed)
Unable to find patient.

## 2019-09-27 NOTE — ED Notes (Signed)
Pt was called three time for triage and didn't answer.
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
## 2019-09-29 ENCOUNTER — Other Ambulatory Visit: Payer: Self-pay

## 2019-09-29 ENCOUNTER — Encounter (HOSPITAL_COMMUNITY): Payer: Self-pay

## 2019-09-29 DIAGNOSIS — Z5321 Procedure and treatment not carried out due to patient leaving prior to being seen by health care provider: Secondary | ICD-10-CM | POA: Diagnosis not present

## 2019-09-29 DIAGNOSIS — M545 Low back pain: Secondary | ICD-10-CM | POA: Insufficient documentation

## 2019-09-29 NOTE — ED Triage Notes (Signed)
Pt states lower back pain radiating down right leg for 4 weeks. Pt sts she was sent here by another hospital. Ambulatory in triage.

## 2019-09-30 ENCOUNTER — Emergency Department (HOSPITAL_COMMUNITY)
Admission: EM | Admit: 2019-09-30 | Discharge: 2019-09-30 | Disposition: A | Discharge status: Home / Self Care | Payer: Medicare Other | Attending: Emergency Medicine | Admitting: Emergency Medicine

## 2019-09-30 NOTE — ED Notes (Signed)
Pt ambulatory to triage to ask about wait times and vital sign recheck.

## 2019-11-27 ENCOUNTER — Other Ambulatory Visit: Payer: Self-pay

## 2019-11-27 ENCOUNTER — Encounter (HOSPITAL_COMMUNITY): Payer: Self-pay

## 2019-11-27 ENCOUNTER — Emergency Department (HOSPITAL_COMMUNITY)
Admission: EM | Admit: 2019-11-27 | Discharge: 2019-11-28 | Disposition: A | Discharge status: Home / Self Care | Payer: Medicare Other | Attending: Emergency Medicine | Admitting: Emergency Medicine

## 2019-11-27 DIAGNOSIS — R1031 Right lower quadrant pain: Secondary | ICD-10-CM

## 2019-11-27 DIAGNOSIS — I2510 Atherosclerotic heart disease of native coronary artery without angina pectoris: Secondary | ICD-10-CM | POA: Diagnosis not present

## 2019-11-27 DIAGNOSIS — R1030 Lower abdominal pain, unspecified: Secondary | ICD-10-CM | POA: Diagnosis not present

## 2019-11-27 DIAGNOSIS — Z79899 Other long term (current) drug therapy: Secondary | ICD-10-CM | POA: Diagnosis not present

## 2019-11-27 DIAGNOSIS — M545 Low back pain: Secondary | ICD-10-CM | POA: Diagnosis present

## 2019-11-27 DIAGNOSIS — N183 Chronic kidney disease, stage 3 unspecified: Secondary | ICD-10-CM | POA: Diagnosis not present

## 2019-11-27 DIAGNOSIS — Z87891 Personal history of nicotine dependence: Secondary | ICD-10-CM | POA: Diagnosis not present

## 2019-11-27 DIAGNOSIS — G8929 Other chronic pain: Secondary | ICD-10-CM | POA: Insufficient documentation

## 2019-11-27 DIAGNOSIS — I158 Other secondary hypertension: Secondary | ICD-10-CM | POA: Diagnosis not present

## 2019-11-27 LAB — CBC
HCT: 42.6 % (ref 36.0–46.0)
Hemoglobin: 14 g/dL (ref 12.0–15.0)
MCH: 30 pg (ref 26.0–34.0)
MCHC: 32.9 g/dL (ref 30.0–36.0)
MCV: 91.2 fL (ref 80.0–100.0)
Platelets: 317 10*3/uL (ref 150–400)
RBC: 4.67 MIL/uL (ref 3.87–5.11)
RDW: 14.1 % (ref 11.5–15.5)
WBC: 6.7 10*3/uL (ref 4.0–10.5)
nRBC: 0 % (ref 0.0–0.2)

## 2019-11-27 LAB — COMPREHENSIVE METABOLIC PANEL
ALT: 12 U/L (ref 0–44)
AST: 17 U/L (ref 15–41)
Albumin: 4.3 g/dL (ref 3.5–5.0)
Alkaline Phosphatase: 92 U/L (ref 38–126)
Anion gap: 8 (ref 5–15)
BUN: 29 mg/dL — ABNORMAL HIGH (ref 8–23)
CO2: 25 mmol/L (ref 22–32)
Calcium: 9.9 mg/dL (ref 8.9–10.3)
Chloride: 103 mmol/L (ref 98–111)
Creatinine, Ser: 1.68 mg/dL — ABNORMAL HIGH (ref 0.44–1.00)
GFR calc Af Amer: 36 mL/min — ABNORMAL LOW (ref >60)
GFR calc non Af Amer: 31 mL/min — ABNORMAL LOW (ref >60)
Glucose, Bld: 104 mg/dL — ABNORMAL HIGH (ref 70–99)
Potassium: 4.1 mmol/L (ref 3.5–5.1)
Sodium: 136 mmol/L (ref 135–145)
Total Bilirubin: 0.7 mg/dL (ref 0.3–1.2)
Total Protein: 8.5 g/dL — ABNORMAL HIGH (ref 6.5–8.1)

## 2019-11-27 LAB — LIPASE, BLOOD: Lipase: 32 U/L (ref 11–51)

## 2019-11-27 NOTE — ED Triage Notes (Signed)
Patient arrived with complaints of lower abdominal pain that started this morning. Reports emesis. Declines taking any OTC medication prior to arrival.

## 2019-11-28 DIAGNOSIS — M545 Low back pain: Secondary | ICD-10-CM | POA: Diagnosis not present

## 2019-11-28 LAB — URINALYSIS, ROUTINE W REFLEX MICROSCOPIC
Bilirubin Urine: NEGATIVE
Glucose, UA: NEGATIVE mg/dL
Hgb urine dipstick: NEGATIVE
Ketones, ur: NEGATIVE mg/dL
Leukocytes,Ua: NEGATIVE
Nitrite: NEGATIVE
Protein, ur: 100 mg/dL — AB
Specific Gravity, Urine: 1.021 (ref 1.005–1.030)
pH: 5 (ref 5.0–8.0)

## 2019-11-28 MED ORDER — ALUM & MAG HYDROXIDE-SIMETH 200-200-20 MG/5ML PO SUSP
30.0000 mL | Freq: Once | ORAL | Status: AC
  Administered 2019-11-28: 30 mL via ORAL
  Filled 2019-11-28: qty 30

## 2019-11-28 MED ORDER — CLONIDINE HCL 0.1 MG PO TABS
0.1000 mg | ORAL_TABLET | Freq: Once | ORAL | Status: AC
  Administered 2019-11-28: 0.1 mg via ORAL
  Filled 2019-11-28: qty 1

## 2019-11-28 MED ORDER — HYOSCYAMINE SULFATE 0.125 MG SL SUBL
0.2500 mg | SUBLINGUAL_TABLET | Freq: Once | SUBLINGUAL | Status: AC
  Administered 2019-11-28: 0.25 mg via SUBLINGUAL
  Filled 2019-11-28: qty 2

## 2019-11-28 MED ORDER — ISOSORBIDE MONONITRATE ER 30 MG PO TB24
30.0000 mg | ORAL_TABLET | Freq: Once | ORAL | Status: AC
  Administered 2019-11-28: 30 mg via ORAL
  Filled 2019-11-28: qty 1
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
## 2019-11-28 NOTE — ED Notes (Signed)
Pt. Discharged with no concerns at this time.

## 2019-11-28 NOTE — ED Notes (Signed)
When this nurse attempted to triage patient, she became upset refusing to tell this nurse anything stating "I already talked to that lady up front, if you want to know something go talk to her Im not repeating it" This nurse and NT Nettie Elm explained to patient that we are going to ask more in depth questions. Patient still uncooperative and verbally aggressive to this nurse.

## 2019-12-01 ENCOUNTER — Encounter (HOSPITAL_COMMUNITY): Payer: Self-pay | Admitting: Emergency Medicine

## 2019-12-01 ENCOUNTER — Emergency Department (HOSPITAL_COMMUNITY)
Admission: EM | Admit: 2019-12-01 | Discharge: 2019-12-02 | Disposition: A | Discharge status: Home / Self Care | Payer: Medicare Other | Attending: Emergency Medicine | Admitting: Emergency Medicine

## 2019-12-01 ENCOUNTER — Other Ambulatory Visit: Payer: Self-pay

## 2019-12-01 DIAGNOSIS — M79672 Pain in left foot: Secondary | ICD-10-CM | POA: Insufficient documentation

## 2019-12-01 DIAGNOSIS — M79671 Pain in right foot: Secondary | ICD-10-CM | POA: Insufficient documentation

## 2019-12-01 DIAGNOSIS — Z5321 Procedure and treatment not carried out due to patient leaving prior to being seen by health care provider: Secondary | ICD-10-CM | POA: Insufficient documentation

## 2019-12-01 NOTE — ED Triage Notes (Signed)
Pt reports bilateral foot pain described as itching and burning.  Was seen at [facility] long today.

## 2019-12-02 NOTE — ED Notes (Signed)
Called pt x3 for vitals, no response. 

## 2019-12-06 ENCOUNTER — Other Ambulatory Visit: Payer: Self-pay

## 2019-12-06 ENCOUNTER — Encounter (HOSPITAL_COMMUNITY): Payer: Self-pay | Admitting: Emergency Medicine

## 2019-12-06 ENCOUNTER — Emergency Department (HOSPITAL_COMMUNITY)
Admission: EM | Admit: 2019-12-06 | Discharge: 2019-12-06 | Disposition: A | Discharge status: Home / Self Care | Payer: Medicare Other | Attending: Emergency Medicine | Admitting: Emergency Medicine

## 2019-12-06 DIAGNOSIS — M545 Low back pain, unspecified: Secondary | ICD-10-CM | POA: Insufficient documentation

## 2019-12-06 DIAGNOSIS — Z87891 Personal history of nicotine dependence: Secondary | ICD-10-CM | POA: Diagnosis not present

## 2019-12-06 DIAGNOSIS — N183 Chronic kidney disease, stage 3 unspecified: Secondary | ICD-10-CM | POA: Insufficient documentation

## 2019-12-06 DIAGNOSIS — G8929 Other chronic pain: Secondary | ICD-10-CM | POA: Insufficient documentation

## 2019-12-06 DIAGNOSIS — I2510 Atherosclerotic heart disease of native coronary artery without angina pectoris: Secondary | ICD-10-CM | POA: Insufficient documentation

## 2019-12-06 DIAGNOSIS — Z79899 Other long term (current) drug therapy: Secondary | ICD-10-CM | POA: Diagnosis not present

## 2019-12-06 DIAGNOSIS — I129 Hypertensive chronic kidney disease with stage 1 through stage 4 chronic kidney disease, or unspecified chronic kidney disease: Secondary | ICD-10-CM | POA: Diagnosis not present

## 2019-12-06 MED ORDER — ACETAMINOPHEN 325 MG PO TABS
650.0000 mg | ORAL_TABLET | Freq: Once | ORAL | Status: AC
  Administered 2019-12-06: 650 mg via ORAL
  Filled 2019-12-06: qty 2

## 2019-12-06 NOTE — Discharge Instructions (Addendum)
Continue to take tylenol 650 mg as needed for pain

## 2019-12-06 NOTE — ED Triage Notes (Signed)
C/o lower back pain x 2-3 days.  Denies injury.  Reports history of same and states, "I'm here for a follow-up."  Denies urinary complaints.
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
## 2019-12-08 ENCOUNTER — Emergency Department (HOSPITAL_COMMUNITY)
Admission: EM | Admit: 2019-12-08 | Discharge: 2019-12-09 | Disposition: A | Discharge status: Home / Self Care | Payer: Medicare Other | Attending: Emergency Medicine | Admitting: Emergency Medicine

## 2019-12-08 ENCOUNTER — Other Ambulatory Visit: Payer: Self-pay

## 2019-12-08 DIAGNOSIS — R451 Restlessness and agitation: Secondary | ICD-10-CM | POA: Insufficient documentation

## 2019-12-08 DIAGNOSIS — R1030 Lower abdominal pain, unspecified: Secondary | ICD-10-CM | POA: Insufficient documentation

## 2019-12-08 DIAGNOSIS — Z5321 Procedure and treatment not carried out due to patient leaving prior to being seen by health care provider: Secondary | ICD-10-CM | POA: Diagnosis not present

## 2019-12-09 ENCOUNTER — Encounter (HOSPITAL_COMMUNITY): Payer: Self-pay | Admitting: Emergency Medicine

## 2019-12-09 ENCOUNTER — Emergency Department (HOSPITAL_COMMUNITY)
Admission: EM | Admit: 2019-12-09 | Discharge: 2019-12-09 | Discharge status: Against Medical Advice | Payer: Medicare Other

## 2019-12-09 DIAGNOSIS — R1030 Lower abdominal pain, unspecified: Secondary | ICD-10-CM | POA: Diagnosis not present

## 2019-12-09 LAB — COMPREHENSIVE METABOLIC PANEL
ALT: 10 U/L (ref 0–44)
AST: 16 U/L (ref 15–41)
Albumin: 3.7 g/dL (ref 3.5–5.0)
Alkaline Phosphatase: 83 U/L (ref 38–126)
Anion gap: 11 (ref 5–15)
BUN: 16 mg/dL (ref 8–23)
CO2: 24 mmol/L (ref 22–32)
Calcium: 9.4 mg/dL (ref 8.9–10.3)
Chloride: 103 mmol/L (ref 98–111)
Creatinine, Ser: 1.47 mg/dL — ABNORMAL HIGH (ref 0.44–1.00)
GFR calc Af Amer: 42 mL/min — ABNORMAL LOW (ref >60)
GFR calc non Af Amer: 36 mL/min — ABNORMAL LOW (ref >60)
Glucose, Bld: 100 mg/dL — ABNORMAL HIGH (ref 70–99)
Potassium: 3.7 mmol/L (ref 3.5–5.1)
Sodium: 138 mmol/L (ref 135–145)
Total Bilirubin: 0.8 mg/dL (ref 0.3–1.2)
Total Protein: 7.4 g/dL (ref 6.5–8.1)

## 2019-12-09 LAB — CBC
HCT: 38.8 % (ref 36.0–46.0)
Hemoglobin: 12.3 g/dL (ref 12.0–15.0)
MCH: 29.3 pg (ref 26.0–34.0)
MCHC: 31.7 g/dL (ref 30.0–36.0)
MCV: 92.4 fL (ref 80.0–100.0)
Platelets: 336 10*3/uL (ref 150–400)
RBC: 4.2 MIL/uL (ref 3.87–5.11)
RDW: 13.9 % (ref 11.5–15.5)
WBC: 7.2 10*3/uL (ref 4.0–10.5)
nRBC: 0 % (ref 0.0–0.2)

## 2019-12-09 LAB — URINALYSIS, ROUTINE W REFLEX MICROSCOPIC
Bacteria, UA: NONE SEEN
Bilirubin Urine: NEGATIVE
Glucose, UA: NEGATIVE mg/dL
Hgb urine dipstick: NEGATIVE
Ketones, ur: NEGATIVE mg/dL
Leukocytes,Ua: NEGATIVE
Nitrite: NEGATIVE
Protein, ur: 100 mg/dL — AB
Specific Gravity, Urine: 1.02 (ref 1.005–1.030)
pH: 5 (ref 5.0–8.0)

## 2019-12-09 LAB — LIPASE, BLOOD: Lipase: 29 U/L (ref 11–51)

## 2019-12-09 NOTE — ED Notes (Signed)
Called x 3 no answer 

## 2019-12-09 NOTE — ED Notes (Signed)
Pt called for triage, no answer x2 
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
## 2019-12-14 ENCOUNTER — Other Ambulatory Visit: Payer: Self-pay

## 2019-12-14 ENCOUNTER — Emergency Department (HOSPITAL_COMMUNITY)
Admission: EM | Admit: 2019-12-14 | Discharge: 2019-12-15 | Disposition: A | Discharge status: Home / Self Care | Payer: Medicare Other | Attending: Emergency Medicine | Admitting: Emergency Medicine

## 2019-12-14 ENCOUNTER — Encounter (HOSPITAL_COMMUNITY): Payer: Self-pay

## 2019-12-14 DIAGNOSIS — Z5321 Procedure and treatment not carried out due to patient leaving prior to being seen by health care provider: Secondary | ICD-10-CM | POA: Diagnosis not present

## 2019-12-14 DIAGNOSIS — M549 Dorsalgia, unspecified: Secondary | ICD-10-CM | POA: Insufficient documentation

## 2019-12-14 NOTE — ED Triage Notes (Signed)
Pt presents to ED for back pain.  States she took tylenol like she was told to do.  States "yall keep filing my insurance but arent doing anything."

## 2019-12-15 ENCOUNTER — Encounter (HOSPITAL_COMMUNITY): Payer: Self-pay | Admitting: Emergency Medicine

## 2019-12-15 ENCOUNTER — Emergency Department (HOSPITAL_COMMUNITY)
Admission: EM | Admit: 2019-12-15 | Discharge: 2019-12-16 | Disposition: A | Discharge status: Home / Self Care | Payer: Medicare Other | Source: Home / Self Care | Attending: Emergency Medicine | Admitting: Emergency Medicine

## 2019-12-15 ENCOUNTER — Other Ambulatory Visit: Payer: Self-pay

## 2019-12-15 DIAGNOSIS — N183 Chronic kidney disease, stage 3 unspecified: Secondary | ICD-10-CM | POA: Insufficient documentation

## 2019-12-15 DIAGNOSIS — Z79899 Other long term (current) drug therapy: Secondary | ICD-10-CM | POA: Insufficient documentation

## 2019-12-15 DIAGNOSIS — Z20822 Contact with and (suspected) exposure to covid-19: Secondary | ICD-10-CM | POA: Insufficient documentation

## 2019-12-15 DIAGNOSIS — Z87891 Personal history of nicotine dependence: Secondary | ICD-10-CM | POA: Insufficient documentation

## 2019-12-15 DIAGNOSIS — I2510 Atherosclerotic heart disease of native coronary artery without angina pectoris: Secondary | ICD-10-CM | POA: Insufficient documentation

## 2019-12-15 DIAGNOSIS — F209 Schizophrenia, unspecified: Secondary | ICD-10-CM | POA: Diagnosis not present

## 2019-12-15 DIAGNOSIS — I129 Hypertensive chronic kidney disease with stage 1 through stage 4 chronic kidney disease, or unspecified chronic kidney disease: Secondary | ICD-10-CM | POA: Insufficient documentation

## 2019-12-15 DIAGNOSIS — R443 Hallucinations, unspecified: Secondary | ICD-10-CM | POA: Insufficient documentation
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
## 2019-12-15 NOTE — ED Notes (Signed)
Pt not answering to vital check 

## 2019-12-15 NOTE — ED Notes (Signed)
Not responding to name being called for vital check

## 2019-12-16 ENCOUNTER — Encounter: Payer: Self-pay | Admitting: Psychiatry

## 2019-12-16 ENCOUNTER — Other Ambulatory Visit: Payer: Self-pay | Admitting: Psychiatry

## 2019-12-16 ENCOUNTER — Inpatient Hospital Stay (HOSPITAL_COMMUNITY)
Admission: AD | Admit: 2019-12-16 | Discharge: 2019-12-22 | DRG: 885 | Disposition: A | Discharge status: Home / Self Care | Payer: Medicare Other | Attending: Emergency Medicine | Admitting: Emergency Medicine

## 2019-12-16 DIAGNOSIS — Z981 Arthrodesis status: Secondary | ICD-10-CM | POA: Diagnosis not present

## 2019-12-16 DIAGNOSIS — F0150 Vascular dementia without behavioral disturbance: Secondary | ICD-10-CM | POA: Diagnosis present

## 2019-12-16 DIAGNOSIS — I2510 Atherosclerotic heart disease of native coronary artery without angina pectoris: Secondary | ICD-10-CM | POA: Diagnosis present

## 2019-12-16 DIAGNOSIS — I350 Nonrheumatic aortic (valve) stenosis: Secondary | ICD-10-CM | POA: Diagnosis present

## 2019-12-16 DIAGNOSIS — Z5329 Procedure and treatment not carried out because of patient's decision for other reasons: Secondary | ICD-10-CM | POA: Diagnosis not present

## 2019-12-16 DIAGNOSIS — E669 Obesity, unspecified: Secondary | ICD-10-CM | POA: Diagnosis present

## 2019-12-16 DIAGNOSIS — Z5900 Homelessness unspecified: Secondary | ICD-10-CM | POA: Diagnosis not present

## 2019-12-16 DIAGNOSIS — M545 Low back pain, unspecified: Secondary | ICD-10-CM | POA: Diagnosis not present

## 2019-12-16 DIAGNOSIS — Z6829 Body mass index (BMI) 29.0-29.9, adult: Secondary | ICD-10-CM

## 2019-12-16 DIAGNOSIS — F203 Undifferentiated schizophrenia: Secondary | ICD-10-CM | POA: Diagnosis not present

## 2019-12-16 DIAGNOSIS — E785 Hyperlipidemia, unspecified: Secondary | ICD-10-CM | POA: Diagnosis present

## 2019-12-16 DIAGNOSIS — I739 Peripheral vascular disease, unspecified: Secondary | ICD-10-CM | POA: Diagnosis present

## 2019-12-16 DIAGNOSIS — I5189 Other ill-defined heart diseases: Secondary | ICD-10-CM | POA: Diagnosis not present

## 2019-12-16 DIAGNOSIS — I422 Other hypertrophic cardiomyopathy: Secondary | ICD-10-CM | POA: Diagnosis present

## 2019-12-16 DIAGNOSIS — Z20822 Contact with and (suspected) exposure to covid-19: Secondary | ICD-10-CM | POA: Diagnosis present

## 2019-12-16 DIAGNOSIS — Z79899 Other long term (current) drug therapy: Secondary | ICD-10-CM | POA: Diagnosis not present

## 2019-12-16 DIAGNOSIS — Z87891 Personal history of nicotine dependence: Secondary | ICD-10-CM

## 2019-12-16 DIAGNOSIS — M79605 Pain in left leg: Secondary | ICD-10-CM | POA: Diagnosis not present

## 2019-12-16 DIAGNOSIS — G8929 Other chronic pain: Secondary | ICD-10-CM | POA: Diagnosis present

## 2019-12-16 DIAGNOSIS — R451 Restlessness and agitation: Secondary | ICD-10-CM | POA: Diagnosis not present

## 2019-12-16 DIAGNOSIS — F419 Anxiety disorder, unspecified: Secondary | ICD-10-CM | POA: Diagnosis present

## 2019-12-16 DIAGNOSIS — M79604 Pain in right leg: Secondary | ICD-10-CM | POA: Diagnosis not present

## 2019-12-16 DIAGNOSIS — N182 Chronic kidney disease, stage 2 (mild): Secondary | ICD-10-CM | POA: Diagnosis present

## 2019-12-16 DIAGNOSIS — Z8673 Personal history of transient ischemic attack (TIA), and cerebral infarction without residual deficits: Secondary | ICD-10-CM | POA: Diagnosis not present

## 2019-12-16 DIAGNOSIS — I129 Hypertensive chronic kidney disease with stage 1 through stage 4 chronic kidney disease, or unspecified chronic kidney disease: Secondary | ICD-10-CM | POA: Diagnosis present

## 2019-12-16 DIAGNOSIS — I10 Essential (primary) hypertension: Secondary | ICD-10-CM | POA: Diagnosis present

## 2019-12-16 DIAGNOSIS — Z9114 Patient's other noncompliance with medication regimen: Secondary | ICD-10-CM | POA: Diagnosis not present

## 2019-12-16 DIAGNOSIS — F209 Schizophrenia, unspecified: Secondary | ICD-10-CM | POA: Diagnosis present

## 2019-12-16 DIAGNOSIS — Z23 Encounter for immunization: Secondary | ICD-10-CM

## 2019-12-16 DIAGNOSIS — G4700 Insomnia, unspecified: Secondary | ICD-10-CM | POA: Diagnosis present

## 2019-12-16 LAB — CBC
HCT: 42.1 % (ref 36.0–46.0)
Hemoglobin: 13.7 g/dL (ref 12.0–15.0)
MCH: 30 pg (ref 26.0–34.0)
MCHC: 32.5 g/dL (ref 30.0–36.0)
MCV: 92.1 fL (ref 80.0–100.0)
Platelets: 333 10*3/uL (ref 150–400)
RBC: 4.57 MIL/uL (ref 3.87–5.11)
RDW: 13.8 % (ref 11.5–15.5)
WBC: 7 10*3/uL (ref 4.0–10.5)
nRBC: 0 % (ref 0.0–0.2)

## 2019-12-16 LAB — URINALYSIS, ROUTINE W REFLEX MICROSCOPIC
Bilirubin Urine: NEGATIVE
Glucose, UA: NEGATIVE mg/dL
Hgb urine dipstick: NEGATIVE
Ketones, ur: NEGATIVE mg/dL
Leukocytes,Ua: NEGATIVE
Nitrite: NEGATIVE
Protein, ur: 100 mg/dL — AB
Specific Gravity, Urine: 1.011 (ref 1.005–1.030)
pH: 5 (ref 5.0–8.0)

## 2019-12-16 LAB — COMPREHENSIVE METABOLIC PANEL
ALT: 13 U/L (ref 0–44)
AST: 17 U/L (ref 15–41)
Albumin: 4.2 g/dL (ref 3.5–5.0)
Alkaline Phosphatase: 95 U/L (ref 38–126)
Anion gap: 9 (ref 5–15)
BUN: 22 mg/dL (ref 8–23)
CO2: 23 mmol/L (ref 22–32)
Calcium: 9.6 mg/dL (ref 8.9–10.3)
Chloride: 103 mmol/L (ref 98–111)
Creatinine, Ser: 1.36 mg/dL — ABNORMAL HIGH (ref 0.44–1.00)
GFR, Estimated: 40 mL/min — ABNORMAL LOW (ref >60)
Glucose, Bld: 107 mg/dL — ABNORMAL HIGH (ref 70–99)
Potassium: 3.7 mmol/L (ref 3.5–5.1)
Sodium: 135 mmol/L (ref 135–145)
Total Bilirubin: 0.8 mg/dL (ref 0.3–1.2)
Total Protein: 8.7 g/dL — ABNORMAL HIGH (ref 6.5–8.1)

## 2019-12-16 LAB — RESP PANEL BY RT PCR (RSV, FLU A&B, COVID)
Influenza A by PCR: NEGATIVE
Influenza B by PCR: NEGATIVE
Respiratory Syncytial Virus by PCR: NEGATIVE
SARS Coronavirus 2 by RT PCR: NEGATIVE

## 2019-12-16 LAB — RAPID URINE DRUG SCREEN, HOSP PERFORMED
Amphetamines: NOT DETECTED
Barbiturates: NOT DETECTED
Benzodiazepines: NOT DETECTED
Cocaine: NOT DETECTED
Opiates: NOT DETECTED
Tetrahydrocannabinol: NOT DETECTED

## 2019-12-16 LAB — ACETAMINOPHEN LEVEL: Acetaminophen (Tylenol), Serum: <10 ug/mL — ABNORMAL LOW (ref 10–30)

## 2019-12-16 LAB — ETHANOL: Alcohol, Ethyl (B): <10 mg/dL (ref <10)

## 2019-12-16 LAB — SALICYLATE LEVEL: Salicylate Lvl: <7 mg/dL — ABNORMAL LOW (ref 7.0–30.0)

## 2019-12-16 MED ORDER — LORAZEPAM 2 MG/ML IJ SOLN
1.0000 mg | Freq: Once | INTRAMUSCULAR | Status: AC
  Administered 2019-12-16: 1 mg via INTRAMUSCULAR
  Filled 2019-12-16: qty 1

## 2019-12-16 MED ORDER — ISOSORBIDE MONONITRATE ER 30 MG PO TB24
30.0000 mg | ORAL_TABLET | Freq: Every day | ORAL | Status: DC
  Administered 2019-12-16: 30 mg via ORAL
  Filled 2019-12-16: qty 1

## 2019-12-16 MED ORDER — CARVEDILOL 12.5 MG PO TABS
25.0000 mg | ORAL_TABLET | Freq: Two times a day (BID) | ORAL | Status: DC
  Administered 2019-12-16: 25 mg via ORAL
  Filled 2019-12-16: qty 2

## 2019-12-16 NOTE — ED Notes (Signed)
Lisa at lab said that UA can be added to UDS from admission last night.

## 2019-12-16 NOTE — Progress Notes (Signed)
Note: Patient presents with irritable affect and mood.  Skin assessment and personal belongings searched and completed.  Skin is dry and intact.  No contraband found.  Patient oriented to the unit, staff and room.  Routine safety checks initiated.  Patient is safe on the unit.

## 2019-12-16 NOTE — ED Notes (Addendum)
PT belonging: $8 in blk purse, blk pants blk jacket

## 2019-12-16 NOTE — ED Notes (Signed)
Transported to [facility] by [employer].  BP was elevated. Dr. Jeraldine Loots ordered home medications which were given to pt. Tried to call [facility] multiple times to inform them of this, but there has not been an answer. APt was cooperative with the transfer.

## 2019-12-16 NOTE — ED Notes (Signed)
Tray ordered for pt. Pt cooperative on admission to TCU.  TWO HOSPITAL BAGS OF BELONGINGS PLACED IN LOCKER 31.

## 2019-12-16 NOTE — BH Assessment (Signed)
[facility] Assessment Progress Note  Per Shuvon Rankin, NP, this pt requires psychiatric hospitalization.  Gretta Arab, RN, [facility] has assigned pt to [facility] Rm 400-2; [facility] will be ready to receive pt at 16:00.  Pt presents under IVC initiated by EDP Drema Pry, MD, and IVC documents have been faxed to [facility].  EDP Donnetta Hutching, MD and pt's nurse, Diane, have been notified, and Diane agrees to call report to [phone].  Pt is to be transported via [employer].   Doylene Canning, [place] [facility] Coordinator [phone]

## 2019-12-16 NOTE — [facility] Counselor (Signed)
TTS attempted consult at [phone]. Pt was sleep at time of consult and upon waking up pt had to use bathroom. TTS will attempt consult in about 10-15 minutes.

## 2019-12-16 NOTE — ED Provider Notes (Signed)
Attestation: Medical screening examination/treatment/procedure(s) were conducted as a shared visit with non-physician practitioner(s) and myself.  I personally evaluated the patient during the encounter.   Briefly, the patient is a 69 y.o. female with h/o schizophrenia, here for here for medical clearance and hallucinations.   Vitals:   12/16/19 1120 12/16/19 1806  BP: 112/85 (!) 174/82  Pulse: 98 69  Resp: 20 18  Temp:  97.7 F (36.5 C)  SpO2: 99% 98%    CONSTITUTIONAL: Nontoxic-appearing, NAD NEURO:  Alert and moves all extremities EYES:  pupils equal and reactive  ENT/NECK:  trachea midline, no JVD CARDIO: Regular rate, regular rhythm, well-perfused PULM: None labored breathing GI/GU:  Abdomen non-distended MSK/SPINE:  No gross deformities, no edema SKIN:  no rash, atraumatic PSYCH: Uncooperative and agitated.  Patient stating that she is going to call the police on [place].   EKG Interpretation  Date/Time:    Ventricular Rate:    PR Interval:    QRS Duration:   QT Interval:    QTC Calculation:   R Axis:     Text Interpretation:         Work up reassuring. Medically cleared. IVC'd due to psychosis. Required chemical restraint for agitation. [facility] dispo.[name]   Marland KitchenCritical Care Performed by: Nira Conn, MD Authorized by: Nira Conn, MD    CRITICAL CARE Performed by: Amadeo Garnet Jakari Sada Total critical care time: 30 minutes Critical care time was exclusive of separately billable procedures and treating other patients. Critical care was necessary to treat or prevent imminent or life-threatening deterioration. Critical care was time spent personally by me on the following activities: development of treatment plan with patient and/or surrogate as well as nursing, discussions with consultants, evaluation of patient's response to treatment, examination of patient, obtaining history from patient or surrogate, ordering and performing treatments and  interventions, ordering and review of laboratory studies, ordering and review of radiographic studies, pulse oximetry and re-evaluation of patient's condition.      Nira Conn, MD 12/16/19 [id number]
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
## 2019-12-16 NOTE — ED Notes (Signed)
Pt wanded by security. Pt ambulated to TCU room 31. Pt belongings placed in locker 31 in [place].
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
## 2019-12-16 NOTE — ED Notes (Signed)
Report given to [facility] at [facility].

## 2019-12-16 NOTE — BHH Counselor (Signed)
Sandy Salaam ,RN, notified that Per Assunta Found, NP, patient is recommended for inpatient treatment also recommends seeking placement at [facility].

## 2019-12-16 NOTE — ED Notes (Signed)
[employer]  Called for transport. 

## 2019-12-17 ENCOUNTER — Encounter (HOSPITAL_COMMUNITY): Payer: Self-pay | Admitting: Psychiatry

## 2019-12-17 DIAGNOSIS — F203 Undifferentiated schizophrenia: Secondary | ICD-10-CM | POA: Diagnosis not present

## 2019-12-17 MED ORDER — ISOSORBIDE DINITRATE 30 MG PO TABS: 30.0000 mg | ORAL_TABLET | Freq: Every day | ORAL | Status: DC

## 2019-12-17 MED ORDER — ACETAMINOPHEN 325 MG PO TABS: 650.0000 mg | ORAL_TABLET | Freq: Four times a day (QID) | ORAL | Status: DC | PRN

## 2019-12-17 MED ORDER — ALUM & MAG HYDROXIDE-SIMETH 200-200-20 MG/5ML PO SUSP: 30.0000 mL | ORAL | Status: DC | PRN

## 2019-12-17 MED ORDER — OLANZAPINE 5 MG PO TBDP
5.0000 mg | ORAL_TABLET | Freq: Every day | ORAL | Status: DC
  Administered 2019-12-17 – 2019-12-18 (×2): 5 mg via ORAL
  Filled 2019-12-17 (×6): qty 1

## 2019-12-17 MED ORDER — HYDRALAZINE HCL 25 MG PO TABS
25.0000 mg | ORAL_TABLET | Freq: Three times a day (TID) | ORAL | Status: DC
  Administered 2019-12-17: 25 mg via ORAL
  Filled 2019-12-17 (×9): qty 1

## 2019-12-17 MED ORDER — OLANZAPINE 10 MG PO TBDP
10.0000 mg | ORAL_TABLET | Freq: Every day | ORAL | Status: DC
  Administered 2019-12-17: 10 mg via ORAL
  Filled 2019-12-17 (×2): qty 1

## 2019-12-17 MED ORDER — MAGNESIUM HYDROXIDE 400 MG/5ML PO SUSP: 30.0000 mL | Freq: Every day | ORAL | Status: DC | PRN

## 2019-12-17 MED ORDER — HYDRALAZINE HCL 50 MG PO TABS
50.0000 mg | ORAL_TABLET | Freq: Three times a day (TID) | ORAL | Status: DC
  Administered 2019-12-17: 50 mg via ORAL
  Filled 2019-12-17 (×5): qty 1

## 2019-12-17 MED ORDER — ISOSORBIDE MONONITRATE ER 30 MG PO TB24
30.0000 mg | ORAL_TABLET | Freq: Every day | ORAL | Status: DC
  Administered 2019-12-17 – 2019-12-21 (×5): 30 mg via ORAL
  Filled 2019-12-17 (×10): qty 1

## 2019-12-17 MED ORDER — CARVEDILOL 25 MG PO TABS
25.0000 mg | ORAL_TABLET | Freq: Two times a day (BID) | ORAL | Status: DC
  Administered 2019-12-17 – 2019-12-21 (×9): 25 mg via ORAL
  Filled 2019-12-17: qty 2
  Filled 2019-12-17 (×4): qty 1
  Filled 2019-12-17: qty 2
  Filled 2019-12-17 (×6): qty 1
  Filled 2019-12-17: qty 2
  Filled 2019-12-17 (×5): qty 1

## 2019-12-17 MED ORDER — TRAZODONE HCL 50 MG PO TABS
50.0000 mg | ORAL_TABLET | Freq: Every evening | ORAL | Status: DC | PRN
  Administered 2019-12-19: 50 mg via ORAL
  Filled 2019-12-17: qty 1

## 2019-12-17 NOTE — Progress Notes (Signed)
Pt refused to get up for lab draw this morning. Will reschedule for this afternoon.

## 2019-12-17 NOTE — Progress Notes (Signed)
Recreation Therapy Notes  Date:  10.13.21 Time: 0930 Location: 300 Group Room  Group Topic: Stress Management  Goal Area(s) Addresses:  Patient will identify positive stress management techniques. Patient will identify benefits of using stress management post d/c.  Intervention: Stress Management  Activity: Guided Imagery.  LRT read a script that led patients on a journey to envision their peaceful place.  Patients were imagine all the things that made their place peaceful to them.  Education:  Stress Management, Discharge Planning.   Education Outcome: Acknowledges Education  Clinical Observations/Feedback:  Pt did not attend activity.    Priyanka Causey, LRT/CTRS         Rider Ermis A 12/17/2019 10:13 AM 

## 2019-12-17 NOTE — Tx Team (Signed)
Initial Treatment Plan 12/17/2019 5:03 AM BENITA BOONSTRA PYY:[id number]    PATIENT STRESSORS: Other: being here at [facility]   PATIENT STRENGTHS: Supportive family/friends   PATIENT IDENTIFIED PROBLEMS: Paranoia  (Pt states that she wants to leave and shouldn't be here in the first place)                   DISCHARGE CRITERIA:  Adequate post-discharge living arrangements Improved stabilization in mood, thinking, and/or behavior Verbal commitment to aftercare and medication compliance  PRELIMINARY DISCHARGE PLAN: Attend PHP/IOP Outpatient therapy Return to previous living arrangement  PATIENT/FAMILY INVOLVEMENT: This treatment plan has been presented to and reviewed with the patient, SHAILYNN FONG, and/or family member.  The patient and family have been given the opportunity to ask questions and make suggestions.  Victorino December, RN 12/17/2019, 5:03 AM

## 2019-12-17 NOTE — Progress Notes (Signed)
Pt has presented with disorganized thought processes and paranoia this shift. Pt initially would not take morning medications and reluctantly she took them at 1215 pm. RN observed pt talking to herself and she is resisted taking blood pressure medication this afternoon.  MHT was able to calm pt down and pt took medication from RN without incident.  RN will continue to monitor and provide support as needed.

## 2019-12-17 NOTE — Progress Notes (Addendum)
Patient ID: Erin Riley, female   DOB: 1950/03/13, 69 y.o.   MRN: [id number] D: Pt here IVC from [facility]. Pt's chief complaint in the ED was paranoid delusions and hallucinations. Pt presented with mumbled disorganized speech.  Pt denies SI/HI/AVH and pain at this time. Pt is irritable and doesn't want to answer questions. Pt signed treatment agreement but won't sign anything else. "I don't want my name on anything here or at the hospital." Pt uncooperative during assessment. "Go bother someone else. Go ask the other people questions. Leave me be." This writer continued to try to get pt to cooperate. Pt poor historian. Pt incoherent and rambling. When asked why she was here, pt stated, "I'm not supposed to be here. I went to hospital to help my mom cause someone needed an IV and they sent me here." Pt is not making sense. Pt states that she lives with her mother. "My mother signed her house over to me." Pt is sitting in the dayroom. Pt is a poor historian.  A: Pt was offered support and encouragement. Pt is uncooperative during assessment. VS assessed by previous shift and pt refused to sign admission paperwork. Belongings searched and contraband items placed in locker. Non-invasive skin search completed by previous shift. Pt offered food and drink and both accepted. Pt introduced to unit milieu by nursing staff. Q 15 minute checks were started for safety.  R: Pt in dayroom watching television. Pt safety maintained on unit.

## 2019-12-17 NOTE — Tx Team (Signed)
Interdisciplinary Treatment and Diagnostic Plan Update  12/17/2019 Time of Session: 9:00am  Erin Riley MRN: [id number]  Principal Diagnosis: <principal problem not specified>  Secondary Diagnoses: Active Problems:   Schizophrenia (HCC)   Current Medications:  Current Facility-Administered Medications  Medication Dose Route Frequency Provider Last Rate Last Admin   acetaminophen (TYLENOL) tablet 650 mg  650 mg Oral Q6H PRN Leevy-Johnson, Brooke A, NP       alum & mag hydroxide-simeth (MAALOX/MYLANTA) 200-200-20 MG/5ML suspension 30 mL  30 mL Oral Q4H PRN Leevy-Johnson, Brooke A, NP       carvedilol (COREG) tablet 25 mg  25 mg Oral BID WC Clary, Greg Lawson, MD   25 mg at 12/17/19 0913   hydrALAZINE (APRESOLINE) tablet 25 mg  25 mg Oral Q8H Clary, Greg Lawson, MD   25 mg at 12/17/19 0914   isosorbide mononitrate (IMDUR) 24 hr tablet 30 mg  30 mg Oral Daily Clary, Greg Lawson, MD   30 mg at 12/17/19 0913   magnesium hydroxide (MILK OF MAGNESIA) suspension 30 mL  30 mL Oral Daily PRN Leevy-Johnson, Brooke A, NP       OLANZapine zydis (ZYPREXA) disintegrating tablet 10 mg  10 mg Oral QHS Clary, Greg Lawson, MD       OLANZapine zydis (ZYPREXA) disintegrating tablet 5 mg  5 mg Oral Daily Clary, Greg Lawson, MD   5 mg at 12/17/19 0914   traZODone (DESYREL) tablet 50 mg  50 mg Oral QHS PRN Leevy-Johnson, Brooke A, NP       PTA Medications: Medications Prior to Admission  Medication Sig Dispense Refill Last Dose   carvedilol (COREG) 25 MG tablet Take 1 tablet (25 mg total) by mouth 2 (two) times daily with a meal. 60 tablet 1    isosorbide mononitrate (IMDUR) 30 MG 24 hr tablet Take 1 tablet (30 mg total) by mouth daily. 30 tablet 1     Patient Stressors: Other: being here at [facility]  Patient Strengths: Supportive family/friends  Treatment Modalities: Medication Management, Group therapy, Case management,  1 to 1 session with clinician, Psychoeducation, Recreational therapy.   Physician  Treatment Plan for Primary Diagnosis: <principal problem not specified> Long Term Goal(s):     Short Term Goals:    Medication Management: Evaluate patient's response, side effects, and tolerance of medication regimen.  Therapeutic Interventions: 1 to 1 sessions, Unit Group sessions and Medication administration.  Evaluation of Outcomes: Not Met  Physician Treatment Plan for Secondary Diagnosis: Active Problems:   Schizophrenia (HCC)  Long Term Goal(s):     Short Term Goals:       Medication Management: Evaluate patient's response, side effects, and tolerance of medication regimen.  Therapeutic Interventions: 1 to 1 sessions, Unit Group sessions and Medication administration.  Evaluation of Outcomes: Not Met   RN Treatment Plan for Primary Diagnosis: <principal problem not specified> Long Term Goal(s): Knowledge of disease and therapeutic regimen to maintain health will improve  Short Term Goals: Ability to remain free from injury will improve, Ability to verbalize frustration and anger appropriately will improve, Ability to participate in decision making will improve, Ability to verbalize feelings will improve, Ability to identify and develop effective coping behaviors will improve and Compliance with prescribed medications will improve  Medication Management: RN will administer medications as ordered by provider, will assess and evaluate patient's response and provide education to patient for prescribed medication. RN will report any adverse and/or side effects to prescribing provider.  Therapeutic Interventions: 1 on 1   counseling sessions, Psychoeducation, Medication administration, Evaluate responses to treatment, Monitor vital signs and CBGs as ordered, Perform/monitor CIWA, COWS, AIMS and Fall Risk screenings as ordered, Perform wound care treatments as ordered.  Evaluation of Outcomes: Not Met   LCSW Treatment Plan for Primary Diagnosis: <principal problem not  specified> Long Term Goal(s): Safe transition to appropriate next level of care at discharge, Engage patient in therapeutic group addressing interpersonal concerns.  Short Term Goals: Engage patient in aftercare planning with referrals and resources, Increase social support, Increase emotional regulation, Facilitate acceptance of mental health diagnosis and concerns, Identify triggers associated with mental health/substance abuse issues and Increase skills for wellness and recovery  Therapeutic Interventions: Assess for all discharge needs, 1 to 1 time with Social worker, Explore available resources and support systems, Assess for adequacy in community support network, Educate family and significant other(s) on suicide prevention, Complete Psychosocial Assessment, Interpersonal group therapy.  Evaluation of Outcomes: Not Met   Progress in Treatment: Attending groups: No. Participating in groups: No. Taking medication as prescribed: No. Toleration medication: No. Family/Significant other contact made: No, will contact:  If consents are given  Patient understands diagnosis: No. Discussing patient identified problems/goals with staff: Yes. Medical problems stabilized or resolved: Yes. Denies suicidal/homicidal ideation: Yes. Issues/concerns per patient self-inventory: No.   New problem(s) identified: Yes, Describe:  Patient recenty admitted with paranoia and does not want to take medications  New Short Term/Long Term Goal(s): medication stabilization, elimination of SI thoughts, development of comprehensive mental wellness plan.   Patient Goals: Did not attend   Discharge Plan or Barriers: Patient recently admitted. CSW will continue to follow and assess for appropriate referrals and possible discharge planning.   Reason for Continuation of Hospitalization: Hallucinations Medication stabilization  Estimated Length of Stay: 3 to 5 days   Attendees: Patient: Did not attend  12/17/2019    Physician:  12/17/2019   Nursing:  12/17/2019   RN Care Manager: Harriett Sine, NP 12/17/2019   Social Worker: Darletta Moll, LCSW 12/17/2019   Recreational Therapist:  12/17/2019   Other:  12/17/2019  Other:  12/17/2019   Other: 12/17/2019     Scribe for Treatment Team: Darleen Crocker, [facility] 12/17/2019 9:20 AM

## 2019-12-17 NOTE — Progress Notes (Signed)
   12/16/19 2030  Psych Admission Type (Psych Patients Only)  Admission Status Involuntary  Psychosocial Assessment  Patient Complaints Irritability;Other (Comment) (confusion)  Eye Contact Avoids  Facial Expression Animated  Affect Irritable  Speech Rapid;Incoherent  Interaction Assertive  Motor Activity Slow  Appearance/Hygiene In scrubs  Behavior Characteristics Unwilling to participate;Irritable  Mood Anxious;Irritable  Thought Process  Coherency Incoherent  Content Blaming others  Delusions None reported or observed  Perception WDL  Hallucination None reported or observed  Judgment Poor  Confusion Mild  Danger to Self  Current suicidal ideation? Denies  Danger to Others  Danger to Others None reported or observed   Pt seen in dayroom. Pt refuses to participate in the admission. "I don't want my name on any papers here or at the hospital." Pt asked questions but answers are incoherent. Pt responses do not always fit the questions. Pt not combative but refuses to answer questions "Go ask someone else these questions. Leave me be." Pt does deny SI, HI, AVH and pain.

## 2019-12-17 NOTE — [facility] Group Notes (Signed)
[facility] LCSW Group Therapy  12/17/2019 11:40 AM  Type of Therapy:  Self Care for Depression   Participation Level:  Did Not Attend  Participation Quality:  Did not attend  Affect:  Did not attend   Cognitive:  Did not attend   Insight:  Did not attend   Engagement in Therapy:  Did not attend   Modes of Intervention:  Did not attend   Summary of Progress/Problems: Pt did not attend the group   Aram Beecham 12/17/2019, 11:40 AM

## 2019-12-17 NOTE — Progress Notes (Signed)
   12/17/19 2100  Psych Admission Type (Psych Patients Only)  Admission Status Involuntary  Psychosocial Assessment  Patient Complaints None  Eye Contact Avoids  Facial Expression Flat  Affect Sad;Preoccupied  Speech Rapid  Interaction Assertive  Motor Activity Slow  Appearance/Hygiene Disheveled;In scrubs  Behavior Characteristics Unwilling to participate;Anxious  Thought Process  Coherency Disorganized  Content Blaming others  Delusions None reported or observed  Perception WDL  Hallucination None reported or observed  Judgment Poor  Confusion None  Danger to Self  Current suicidal ideation? Denies  Danger to Others  Danger to Others None reported or observed   Pt denies SI, HI, AVH and pain. Pt in bed and states she is getting up but never does when it is time for meds. This Clinical research associate took meds to her and she took them without incident. Pt BP still elevated despite earlier administration of BP meds. Pt not c/o any symptoms. Will continue to monitor.

## 2019-12-17 NOTE — Progress Notes (Signed)
RN was unable to get pt's EKG due to pt's paranoia and agitation.  RN will relay this information to oncoming night nurse to see if it is possible for night shift to get pt's EKG.

## 2019-12-17 NOTE — [facility] Suicide Risk Assessment (Signed)
[facility] Admission Suicide Risk Assessment   Nursing information obtained from:  Patient Demographic factors:  Age 69 or older, Low socioeconomic status Current Mental Status:  NA Loss Factors:  NA Historical Factors:  NA Risk Reduction Factors:  Sense of responsibility to family, Living with another person, especially a relative  Total Time spent with patient: 30 minutes Principal Problem: <principal problem not specified> Diagnosis:  Active Problems:   Schizophrenia (HCC)  Subjective Data: Patient is seen and examined.  Patient is a 69 year old female with a reported past psychiatric history significant for schizophrenia who originally presented to the [facility] emergency department on 12/15/2019 requesting medical clearance.  The patient is not a good historian.  According to the notes from the emergency department the patient presented with hallucinations.  On examination this morning she denied having hallucinations, but admitted "I do talk to [facility]".  She also showed evidence of paranoia.  She was mumbling and stating that "most people are watching me and they are going to cut me".  When asked about her medications she endorsed taking blood pressure medicines but denied any other medications.  She was evaluated by the comprehensive clinical assessment team on 12/16/2019.  She again was not a very good historian.  It was recommended that she be admitted to the psychiatric facility.  Review of the electronic medical record from our facility revealed multiple emergency room visits during 2021.  Most of these were for somatic complaints.  Her last psychiatric hospitalization within our system was in November 2020.  She was admitted to the [facility].  She had also previously been admitted to [facility] in July of that year as well.  Her discharge medications in 2020 included carvedilol, Pepcid, hydralazine, isosorbide and olanzapine.  She was receiving  olanzapine 10 mg p.o. twice daily.  A previous admission to what appears to be the medical hospital on 01/2018 and included olanzapine 20 mg but directions were not clear.  She was admitted to the hospital for evaluation and stabilization.  Continued Clinical Symptoms:  Alcohol Use Disorder Identification Test Final Score (AUDIT): 0 The "Alcohol Use Disorders Identification Test", Guidelines for Use in Primary Care, Second Edition.  World Science writer [facility]). Score between 0-7:  no or low risk or alcohol related problems. Score between 8-15:  moderate risk of alcohol related problems. Score between 16-19:  high risk of alcohol related problems. Score 20 or above:  warrants further diagnostic evaluation for alcohol dependence and treatment.   CLINICAL FACTORS:   Schizophrenia:   Command hallucinatons Paranoid or undifferentiated type Medical Diagnoses and Treatments/Surgeries   Musculoskeletal: Strength & Muscle Tone: decreased Gait & Station: unsteady Patient leans: N/A  Psychiatric Specialty Exam: Physical Exam Vitals and nursing note reviewed.  HENT:     Head: Normocephalic and atraumatic.  Pulmonary:     Effort: Pulmonary effort is normal.  Neurological:     General: No focal deficit present.     Mental Status: She is alert.     Review of Systems  Blood pressure (!) 164/102, pulse 74, temperature 98 F (36.7 C), temperature source Oral, resp. rate 18, last menstrual period 05/10/2013, SpO2 98 %.There is no height or weight on file to calculate BMI.  General Appearance: Disheveled  Eye Contact:  Minimal  Speech:  Normal Rate  Volume:  Decreased  Mood:  Dysphoric  Affect:  Flat  Thought Process:  Disorganized and Descriptions of Associations: Loose  Orientation:  Negative  Thought Content:  Delusions, Hallucinations: Auditory and Paranoid Ideation  Suicidal Thoughts:  No  Homicidal Thoughts:  No  Memory:  Immediate;   Poor Recent;   Poor Remote;   Poor   Judgement:  Impaired  Insight:  Lacking  Psychomotor Activity:  Normal  Concentration:  Concentration: Fair and Attention Span: Fair  Recall:  [employer] of Knowledge:  Poor  Language:  Fair  Akathisia:  Negative  Handed:  Right  AIMS (if indicated):     Assets:  Desire for Improvement Resilience  ADL's:  Impaired  Cognition:  Impaired,  Mild  Sleep:  Number of Hours: 3      COGNITIVE FEATURES THAT CONTRIBUTE TO RISK:  None    SUICIDE RISK:   Mild:  Suicidal ideation of limited frequency, intensity, duration, and specificity.  There are no identifiable plans, no associated intent, mild dysphoria and related symptoms, good self-control (both objective and subjective assessment), few other risk factors, and identifiable protective factors, including available and accessible social support.  PLAN OF CARE: Patient is seen and examined.  Patient is a 69 year old female with a past psychiatric history significant for schizophrenia and a past medical history significant for suspected coronary artery disease, hypertension, and COPD.  She will be admitted to the hospital.  She will be integrated in the milieu.  She will be encouraged to attend groups.  Unfortunately her blood pressure is significantly elevated this morning, but I have gone on and restarted her carvedilol as well as her isosorbide.  We will also restart the hydralazine given the fact that it was necessary previously and her pressures are elevated today.  We will also restart her Zyprexa.  We will start at 5 mg p.o. daily and 10 mg p.o. nightly.  Review of her admission laboratories revealed a mildly elevated creatinine at 1.36, but previously the creatinine was elevated higher than this.  Liver function enzymes were normal.  Potassium and sodium were normal.  CBC was normal.  Her acetaminophen was less than 10, salicylate less than 7.  Urinalysis was essentially normal.  Alcohol was less than 10.  Drug screen was negative.  An EKG  was not obtained, but we will order that today.  We will also order a TSH for completeness sake.  Review of her vital signs revealed that her blood pressure was significantly elevated this morning at 174/82, and repeat was 164/102.  Pulse was between 69 and 74.  She is afebrile.  Her pulse oximetry on room air was 98 percent.  I certify that inpatient services furnished can reasonably be expected to improve the patient's condition.   Antonieta Pert, MD 12/17/2019, 8:01 AM

## 2019-12-17 NOTE — H&P (Signed)
Psychiatric Admission Assessment Adult  Patient Identification: Erin Riley MRN:  [id number] Date of Evaluation:  12/17/2019 Chief Complaint:  Schizophrenia (HCC) [F20.9] Principal Diagnosis: <principal problem not specified> Diagnosis:  Active Problems:   Schizophrenia (HCC)  History of Present Illness: Patient is seen and examined.  Patient is a 69 year old female with a reported past psychiatric history significant for schizophrenia who originally presented to the [facility] emergency department on 12/15/2019 requesting medical clearance.  The patient is not a good historian.  According to the notes from the emergency department the patient presented with hallucinations.  On examination this morning she denied having hallucinations, but admitted "I do talk to [facility]".  She also showed evidence of paranoia.  She was mumbling and stating that "most people are watching me and they are going to cut me".  When asked about her medications she endorsed taking blood pressure medicines but denied any other medications.  She was evaluated by the comprehensive clinical assessment team on 12/16/2019.  She again was not a very good historian.  It was recommended that she be admitted to the psychiatric facility.  Review of the electronic medical record from our facility revealed multiple emergency room visits during 2021.  Most of these were for somatic complaints.  Her last psychiatric hospitalization within our system was in November 2020.  She was admitted to the [facility].  She had also previously been admitted to [facility] in July of that year as well.  Her discharge medications in 2020 included carvedilol, Pepcid, hydralazine, isosorbide and olanzapine.  She was receiving olanzapine 10 mg p.o. twice daily.  A previous admission to what appears to be the medical hospital on 01/2018 and included olanzapine 20 mg but directions were not clear.  She was admitted to the  hospital for evaluation and stabilization.  Associated Signs/Symptoms: Depression Symptoms:  anhedonia, insomnia, psychomotor agitation, difficulty concentrating, anxiety, disturbed sleep, Duration of Depression Symptoms: No data recorded (Hypo) Manic Symptoms:  Delusions, Hallucinations, Impulsivity, Irritable Mood, Labiality of Mood, Anxiety Symptoms:  Excessive Worry, Psychotic Symptoms:  Delusions, Hallucinations: Auditory Paranoia, Duration of Psychotic Symptoms: No data recorded PTSD Symptoms: Negative Total Time spent with patient: 45 minutes  Past Psychiatric History: She carries a diagnosis of schizophrenia.  Her last admission to our system was on 01/31/2019.  She was admitted at [facility].  She has several other psychiatric hospitalizations.  She has been stabilized on Zyprexa in the past.  Is the patient at risk to self? Yes.    Has the patient been a risk to self in the past 6 months? Yes.    Has the patient been a risk to self within the distant past? Yes.    Is the patient a risk to others? Yes.    Has the patient been a risk to others in the past 6 months? Yes.    Has the patient been a risk to others within the distant past? Yes.     Prior Inpatient Therapy:   Prior Outpatient Therapy:    Alcohol Screening: 1. How often do you have a drink containing alcohol?: Never 2. How many drinks containing alcohol do you have on a typical day when you are drinking?: 1 or 2 3. How often do you have six or more drinks on one occasion?: Never AUDIT-C Score: 0 9. Have you or someone else been injured as a result of your drinking?: No 10. Has a relative or friend or a doctor or  another Medical laboratory scientific officer been concerned about your drinking or suggested you cut down?: No Alcohol Use Disorder Identification Test Final Score (AUDIT): 0 Alcohol Brief Interventions/Follow-up: Patient Refused, AUDIT Score <7 follow-up not indicated Substance Abuse History in  the last 12 months:  No. Consequences of Substance Abuse: Negative Previous Psychotropic Medications: Yes  Psychological Evaluations: Yes  Past Medical History:  Past Medical History:  Diagnosis Date  . Chronic back pain   . Chronic kidney disease (CKD), stage II (mild)    Class I-II  . Coronary artery disease 04/2009   50% stenosis in the perforator of LAD; catheterization was for an abnormal Myoview in January 2000 showing anterior and inferolateral ischemia.  . Diverticulitis   . 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 > 3 subsequent Echos show improved/normal EF with moderate to severe concentric LVH and diastolic dysfunction with LVOT/intracavitary gradient --> 06/2016: Severe LVH.  Vigorous EF, 65-70%.?? Gr 1 DD. Mild AS.  [name] Hyperlipidemia   . Hypertension   . Hypertensive hypertrophic cardiomyopathy: NYHA class II:  Echo: Severe concentric LVH with LV OT gradient; essentially preserved EF with diastolic dysfunction 02/15/2013   Echo 06/2016: Severe Concentric LVH. Vigorous EF 65-70%. ~ Gr I DD.   . Mild aortic stenosis by prior echocardiography    Echo 06/2016: Mild AS (Mean Gradient 15 mmHg); has had prior Mod-Severe MR (not seen on current echo)  . PAD (peripheral artery disease) [facility]) March 2013   Lower extremity Dopplers: R. SFA 50-60%, R. PTA proximally occluded with distal reconstitution;; L. common iliac ~50%, L. SFA 50-70% stenosis, L. PTA < 50%  . Schizophrenia [facility])     Past Surgical History:  Procedure Laterality Date  . BUNIONECTOMY    . carotid doppler  05/29/2011   left bulb/prox ICA moderate amtfibrous plaque with no evidence significant reduction.,right bulb /proximal ICA normal patency  . lower extremity doppler  05/29/2011   right SFA 50% to 59% diameter reduction,right posterior tibal atreery occlusive disease,reconstituting distally, left common illiac<50%,left SFA  50 to70%,left post. tibial <50%  . NM MYOCAR PERF WALL MOTION  03/2009   Persantine; EF 51%-both anterior and inferolateral ischemia  . TRANSTHORACIC ECHOCARDIOGRAM  06/2016   Severe LVH.  Vigorous EF of 65-70%.  No RWMA. ~Only grade 1 diastolic dysfunction.  Mild aortic stenosis (mean gradient 15 mmHg)  . TRANSTHORACIC ECHOCARDIOGRAM  07/2012   EF 50-55%; severe concentric LVH; only grade 1 diastolic dysfunction. Mild aortic sclerosis - with LVOT /intracavitary gradient of roughly 20 mmHg mean. Mild to moderately dilated LA;; previously reported MR not seen   Family History:  Family History  Problem Relation Age of Onset  . Hypertension Mother   . Breast cancer Neg Hx    Family Psychiatric  History: Noncontributory Tobacco Screening: Have you used any form of tobacco in the last 30 days? (Cigarettes, Smokeless Tobacco, Cigars, and/or Pipes): No Social History:  Social History   Substance and Sexual Activity  Alcohol Use No     Social History   Substance and Sexual Activity  Drug Use No    Additional Social History:                           Allergies:  No Known Allergies Lab Results:  Results for orders placed or performed during the hospital encounter of 12/15/19 (from the past 48 hour(s))  Comprehensive metabolic  panel     Status: Abnormal   Collection Time: 12/15/19 11:50 PM  Result Value Ref Range   Sodium 135 135 - 145 mmol/L   Potassium 3.7 3.5 - 5.1 mmol/L   Chloride 103 98 - 111 mmol/L   CO2 23 22 - 32 mmol/L   Glucose, Bld 107 (H) 70 - 99 mg/dL    Comment: Glucose reference range applies only to samples taken after fasting for at least 8 hours.   BUN 22 8 - 23 mg/dL   Creatinine, Ser 2.67 (H) 0.44 - 1.00 mg/dL   Calcium 9.6 8.9 - 12.4 mg/dL   Total Protein 8.7 (H) 6.5 - 8.1 g/dL   Albumin 4.2 3.5 - 5.0 g/dL   AST 17 15 - 41 U/L   ALT 13 0 - 44 U/L   Alkaline Phosphatase 95 38 - 126 U/L   Total Bilirubin 0.8 0.3 - 1.2 mg/dL   GFR, Estimated 40  (L) >60 mL/min   Anion gap 9 5 - 15    Comment: Performed at [facility], [address], [place], [place] [address]  Ethanol     Status: None   Collection Time: 12/15/19 11:50 PM  Result Value Ref Range   Alcohol, Ethyl (B) <10 <10 mg/dL    Comment: (NOTE) Lowest detectable limit for serum alcohol is 10 mg/dL.  For medical purposes only. Performed at [facility], [address] [place], [place] [address]   Salicylate level     Status: Abnormal   Collection Time: 12/15/19 11:50 PM  Result Value Ref Range   Salicylate Lvl <7.0 (L) 7.0 - 30.0 mg/dL    Comment: Performed at [facility], [address], [place], [place] [address]  Acetaminophen level     Status: Abnormal   Collection Time: 12/15/19 11:50 PM  Result Value Ref Range   Acetaminophen (Tylenol), Serum <10 (L) 10 - 30 ug/mL    Comment: (NOTE) Therapeutic concentrations vary significantly. A range of 10-30 ug/mL  may be an effective concentration for many patients. However, some  are best treated at concentrations outside of this range. Acetaminophen concentrations >150 ug/mL at 4 hours after ingestion  and >50 ug/mL at 12 hours after ingestion are often associated with  toxic reactions.  Performed at [facility], [address] [place], [place] [address]   cbc     Status: None   Collection Time: 12/15/19 11:50 PM  Result Value Ref Range   WBC 7.0 4.0 - 10.5 K/uL   RBC 4.57 3.87 - 5.11 MIL/uL   Hemoglobin 13.7 12.0 - 15.0 g/dL   HCT 19.3 36 - 46 %   MCV 92.1 80.0 - 100.0 fL   MCH 30.0 26.0 - 34.0 pg   MCHC 32.5 30.0 - 36.0 g/dL   RDW 79.0 24.0 - 97.3 %   Platelets 333 150 - 400 K/uL   nRBC 0.0 0.0 - 0.2 %    Comment: Performed at [facility], [address], [place], [place] [address]  Rapid urine drug screen (hospital performed)     Status: None   Collection Time: 12/15/19 11:50 PM  Result Value Ref Range    Opiates NONE DETECTED NONE DETECTED   Cocaine NONE DETECTED NONE DETECTED   Benzodiazepines NONE DETECTED NONE DETECTED   Amphetamines NONE DETECTED NONE DETECTED   Tetrahydrocannabinol NONE DETECTED NONE DETECTED   Barbiturates NONE DETECTED NONE DETECTED    Comment: (NOTE) DRUG SCREEN  FOR MEDICAL PURPOSES ONLY.  IF CONFIRMATION IS NEEDED FOR ANY PURPOSE, NOTIFY LAB WITHIN 5 DAYS.  LOWEST DETECTABLE LIMITS FOR URINE DRUG SCREEN Drug Class                     Cutoff (ng/mL) Amphetamine and metabolites    1000 Barbiturate and metabolites    200 Benzodiazepine                 200 Tricyclics and metabolites     300 Opiates and metabolites        300 Cocaine and metabolites        300 THC                            50 Performed at [facility], [address] [place], [place] [id number]   Resp Panel by RT PCR (RSV, Flu A&B, Covid) - Nasopharyngeal Swab     Status: None   Collection Time: 12/16/19  5:21 AM   Specimen: Nasopharyngeal Swab  Result Value Ref Range   SARS Coronavirus 2 by RT PCR NEGATIVE NEGATIVE    Comment: (NOTE) SARS-CoV-2 target nucleic acids are NOT DETECTED.  The SARS-CoV-2 RNA is generally detectable in upper respiratoy specimens during the acute phase of infection. The lowest concentration of SARS-CoV-2 viral copies this assay can detect is 131 copies/mL. A negative result does not preclude SARS-Cov-2 infection and should not be used as the sole basis for treatment or other patient management decisions. A negative result may occur with  improper specimen collection/handling, submission of specimen other than nasopharyngeal swab, presence of viral mutation(s) within the areas targeted by this assay, and inadequate number of viral copies (<131 copies/mL). A negative result must be combined with clinical observations, patient history, and epidemiological information. The expected result is Negative.  Fact Sheet for Patients:   https://www.moore.com/https://www.fda.gov/media/142436/download  Fact Sheet for Healthcare Providers:  https://www.young.biz/https://www.fda.gov/media/142435/download  This test is no t yet approved or cleared by the [place] FDA and  has been authorized for detection and/or diagnosis of SARS-CoV-2 by FDA under an Emergency Use Authorization (EUA). This EUA will remain  in effect (meaning this test can be used) for the duration of the COVID-19 declaration under Section 564(b)(1) of the Act, 21 U.S.C. section 360bbb-3(b)(1), unless the authorization is terminated or revoked sooner.     Influenza A by PCR NEGATIVE NEGATIVE   Influenza B by PCR NEGATIVE NEGATIVE    Comment: (NOTE) The Xpert Xpress SARS-CoV-2/FLU/RSV assay is intended as an aid in  the diagnosis of influenza from Nasopharyngeal swab specimens and  should not be used as a sole basis for treatment. Nasal washings and  aspirates are unacceptable for Xpert Xpress SARS-CoV-2/FLU/RSV  testing.  Fact Sheet for Patients: https://www.moore.com/https://www.fda.gov/media/142436/download  Fact Sheet for Healthcare Providers: https://www.young.biz/https://www.fda.gov/media/142435/download  This test is not yet approved or cleared by the [place] FDA and  has been authorized for detection and/or diagnosis of SARS-CoV-2 by  FDA under an Emergency Use Authorization (EUA). This EUA will remain  in effect (meaning this test can be used) for the duration of the  Covid-19 declaration under Section 564(b)(1) of the Act, 21  U.S.C. section 360bbb-3(b)(1), unless the authorization is  terminated or revoked.    Respiratory Syncytial Virus by PCR NEGATIVE NEGATIVE    Comment: (NOTE) Fact Sheet for Patients: https://www.moore.com/https://www.fda.gov/media/142436/download  Fact Sheet for Healthcare Providers: https://www.young.biz/https://www.fda.gov/media/142435/download  This test is not yet approved  or cleared by the [place] and  has been authorized for detection and/or diagnosis of SARS-CoV-2 by  FDA under an Emergency Use Authorization  (EUA). This EUA will remain  in effect (meaning this test can be used) for the duration of the  COVID-19 declaration under Section 564(b)(1) of the Act, 21 U.S.C.  section 360bbb-3(b)(1), unless the authorization is terminated or  revoked. Performed at [facility], [address], [place], [place] [address]   Urinalysis, Routine w reflex microscopic Urine, Clean Catch     Status: Abnormal   Collection Time: 12/16/19  2:50 PM  Result Value Ref Range   Color, Urine YELLOW YELLOW   APPearance CLEAR CLEAR   Specific Gravity, Urine 1.011 1.005 - 1.030   pH 5.0 5.0 - 8.0   Glucose, UA NEGATIVE NEGATIVE mg/dL   Hgb urine dipstick NEGATIVE NEGATIVE   Bilirubin Urine NEGATIVE NEGATIVE   Ketones, ur NEGATIVE NEGATIVE mg/dL   Protein, ur 604 (A) NEGATIVE mg/dL   Nitrite NEGATIVE NEGATIVE   Leukocytes,Ua NEGATIVE NEGATIVE   RBC / HPF 0-5 0 - 5 RBC/hpf   WBC, UA 0-5 0 - 5 WBC/hpf   Bacteria, UA RARE (A) NONE SEEN   Squamous Epithelial / LPF 0-5 0 - 5   Mucus PRESENT    Hyaline Casts, UA PRESENT     Comment: Performed at [facility], [address] [place], [place] [address]    Blood Alcohol level:  Lab Results  Component Value Date   ETH <10 12/15/2019   ETH <10 08/07/2019    Metabolic Disorder Labs:  Lab Results  Component Value Date   HGBA1C 5.9 (H) 06/09/2016   MPG 123 06/09/2016   MPG 105 07/31/2012   Lab Results  Component Value Date   PROLACTIN 16.5 06/09/2016   Lab Results  Component Value Date   CHOL 123 08/31/2017   TRIG 120 08/31/2017   HDL 40 08/31/2017   CHOLHDL 3.1 08/31/2017   VLDL 55 (H) 06/09/2016   LDLCALC 59 08/31/2017   LDLCALC 80 06/09/2016    Current Medications: Current Facility-Administered Medications  Medication Dose Route Frequency Provider Last Rate Last Admin  . acetaminophen (TYLENOL) tablet 650 mg  650 mg Oral Q6H PRN Leevy-Johnson, Brooke A, NP      . alum & mag hydroxide-simeth (MAALOX/MYLANTA)  200-200-20 MG/5ML suspension 30 mL  30 mL Oral Q4H PRN Leevy-Johnson, Brooke A, NP      . carvedilol (COREG) tablet 25 mg  25 mg Oral BID WC Antonieta Pert, MD   25 mg at 12/17/19 1219  . hydrALAZINE (APRESOLINE) tablet 25 mg  25 mg Oral Q8H Antonieta Pert, MD   25 mg at 12/17/19 1219  . isosorbide mononitrate (IMDUR) 24 hr tablet 30 mg  30 mg Oral Daily Antonieta Pert, MD   30 mg at 12/17/19 1219  . magnesium hydroxide (MILK OF MAGNESIA) suspension 30 mL  30 mL Oral Daily PRN Leevy-Johnson, Brooke A, NP      . OLANZapine zydis (ZYPREXA) disintegrating tablet 10 mg  10 mg Oral QHS Antonieta Pert, MD      . OLANZapine zydis (ZYPREXA) disintegrating tablet 5 mg  5 mg Oral Daily Antonieta Pert, MD   5 mg at 12/17/19 1219  . traZODone (DESYREL) tablet 50 mg  50 mg Oral QHS PRN Leevy-Johnson, Brooke A, NP       PTA Medications: Medications Prior to Admission  Medication Sig Dispense  Refill Last Dose  . carvedilol (COREG) 25 MG tablet Take 1 tablet (25 mg total) by mouth 2 (two) times daily with a meal. 60 tablet 1   . isosorbide mononitrate (IMDUR) 30 MG 24 hr tablet Take 1 tablet (30 mg total) by mouth daily. 30 tablet 1     Musculoskeletal: Strength & Muscle Tone: decreased Gait & Station: unsteady Patient leans: N/A  Psychiatric Specialty Exam: Physical Exam Vitals and nursing note reviewed.  HENT:     Head: Normocephalic and atraumatic.  Pulmonary:     Effort: Pulmonary effort is normal.  Neurological:     General: No focal deficit present.     Mental Status: She is alert.     Review of Systems  All other systems reviewed and are negative.   Blood pressure (!) 178/105, pulse 77, temperature 98 F (36.7 C), temperature source Oral, resp. rate 18, last menstrual period 05/10/2013, SpO2 98 %.There is no height or weight on file to calculate BMI.  General Appearance: Disheveled  Eye Contact:  Minimal  Speech:  Normal Rate  Volume:  Increased  Mood:  Anxious,  Dysphoric and Irritable  Affect:  Congruent  Thought Process:  Disorganized and Descriptions of Associations: Loose  Orientation:  Negative  Thought Content:  Delusions, Hallucinations: Auditory and Paranoid Ideation  Suicidal Thoughts:  No  Homicidal Thoughts:  No  Memory:  Immediate;   Poor Recent;   Poor Remote;   Poor  Judgement:  Impaired  Insight:  Lacking  Psychomotor Activity:  Increased  Concentration:  Concentration: Poor  Recall:  Poor  Fund of Knowledge:  Poor  Language:  Fair  Akathisia:  Negative  Handed:  Right  AIMS (if indicated):     Assets:  Desire for Improvement Resilience  ADL's:  Impaired  Cognition:  Impaired,  Mild  Sleep:  Number of Hours: 3    Treatment Plan Summary: Daily contact with patient to assess and evaluate symptoms and progress in treatment, Medication management and Plan : Patient is seen and examined.  Patient is a 69 year old female with a past psychiatric history significant for schizophrenia and a past medical history significant for suspected coronary artery disease, hypertension, and COPD.  She will be admitted to the hospital.  She will be integrated in the milieu.  She will be encouraged to attend groups.  Unfortunately her blood pressure is significantly elevated this morning, but I have gone on and restarted her carvedilol as well as her isosorbide.  We will also restart the hydralazine given the fact that it was necessary previously and her pressures are elevated today.  We will also restart her Zyprexa.  We will start at 5 mg p.o. daily and 10 mg p.o. nightly.  Review of her admission laboratories revealed a mildly elevated creatinine at 1.36, but previously the creatinine was elevated higher than this.  Liver function enzymes were normal.  Potassium and sodium were normal.  CBC was normal.  Her acetaminophen was less than 10, salicylate less than 7.  Urinalysis was essentially normal.  Alcohol was less than 10.  Drug screen was negative.   An EKG was not obtained, but we will order that today.  We will also order a TSH for completeness sake.  Review of her vital signs revealed that her blood pressure was significantly elevated this morning at 174/82, and repeat was 164/102.  Pulse was between 69 and 74.  She is afebrile.  Her pulse oximetry on room air was 98 percent.  Observation Level/Precautions:  15 minute checks  Laboratory:  Chemistry Profile  Psychotherapy:    Medications:    Consultations:    Discharge Concerns:    Estimated LOS:  Other:     Physician Treatment Plan for Primary Diagnosis: <principal problem not specified> Long Term Goal(s): Improvement in symptoms so as ready for discharge  Short Term Goals: Ability to identify changes in lifestyle to reduce recurrence of condition will improve, Ability to verbalize feelings will improve, Ability to demonstrate self-control will improve, Ability to identify and develop effective coping behaviors will improve, Ability to maintain clinical measurements within normal limits will improve and Compliance with prescribed medications will improve  Physician Treatment Plan for Secondary Diagnosis: Active Problems:   Schizophrenia (HCC)  Long Term Goal(s): Improvement in symptoms so as ready for discharge  Short Term Goals: Ability to identify changes in lifestyle to reduce recurrence of condition will improve, Ability to verbalize feelings will improve, Ability to demonstrate self-control will improve, Ability to identify and develop effective coping behaviors will improve, Ability to maintain clinical measurements within normal limits will improve and Compliance with prescribed medications will improve  I certify that inpatient services furnished can reasonably be expected to improve the patient's condition.    Antonieta Pert, MD 10/13/20211:27 PM

## 2019-12-18 ENCOUNTER — Encounter (HOSPITAL_COMMUNITY): Payer: Self-pay | Admitting: Psychiatry

## 2019-12-18 DIAGNOSIS — F203 Undifferentiated schizophrenia: Secondary | ICD-10-CM | POA: Diagnosis not present

## 2019-12-18 LAB — URINALYSIS, COMPLETE (UACMP) WITH MICROSCOPIC
Bilirubin Urine: NEGATIVE
Glucose, UA: NEGATIVE mg/dL
Hgb urine dipstick: NEGATIVE
Ketones, ur: NEGATIVE mg/dL
Leukocytes,Ua: NEGATIVE
Nitrite: NEGATIVE
Protein, ur: 100 mg/dL — AB
Specific Gravity, Urine: 1.019 (ref 1.005–1.030)
pH: 5 (ref 5.0–8.0)

## 2019-12-18 LAB — BASIC METABOLIC PANEL
Anion gap: 8 (ref 5–15)
BUN: 25 mg/dL — ABNORMAL HIGH (ref 8–23)
CO2: 23 mmol/L (ref 22–32)
Calcium: 9.2 mg/dL (ref 8.9–10.3)
Chloride: 110 mmol/L (ref 98–111)
Creatinine, Ser: 1.4 mg/dL — ABNORMAL HIGH (ref 0.44–1.00)
GFR, Estimated: 39 mL/min — ABNORMAL LOW (ref >60)
Glucose, Bld: 140 mg/dL — ABNORMAL HIGH (ref 70–99)
Potassium: 3.9 mmol/L (ref 3.5–5.1)
Sodium: 141 mmol/L (ref 135–145)

## 2019-12-18 LAB — RAPID URINE DRUG SCREEN, HOSP PERFORMED
Amphetamines: NOT DETECTED
Barbiturates: NOT DETECTED
Benzodiazepines: NOT DETECTED
Cocaine: NOT DETECTED
Opiates: NOT DETECTED
Tetrahydrocannabinol: NOT DETECTED

## 2019-12-18 LAB — LIPID PANEL
Cholesterol: 188 mg/dL (ref 0–200)
HDL: 38 mg/dL — ABNORMAL LOW (ref >40)
LDL Cholesterol: 99 mg/dL (ref 0–99)
Total CHOL/HDL Ratio: 4.9 RATIO
Triglycerides: 256 mg/dL — ABNORMAL HIGH (ref <150)
VLDL: 51 mg/dL — ABNORMAL HIGH (ref 0–40)

## 2019-12-18 LAB — TSH: TSH: 1.253 u[IU]/mL (ref 0.350–4.500)

## 2019-12-18 LAB — FOLATE: Folate: 9.4 ng/mL (ref >5.9)

## 2019-12-18 LAB — PREGNANCY, URINE: Preg Test, Ur: NEGATIVE

## 2019-12-18 LAB — MAGNESIUM: Magnesium: 2.3 mg/dL (ref 1.7–2.4)

## 2019-12-18 MED ORDER — PNEUMOCOCCAL VAC POLYVALENT 25 MCG/0.5ML IJ INJ
0.5000 mL | INJECTION | INTRAMUSCULAR | Status: DC
  Filled 2019-12-18: qty 0.5

## 2019-12-18 MED ORDER — HALOPERIDOL LACTATE 5 MG/ML IJ SOLN
5.0000 mg | Freq: Two times a day (BID) | INTRAMUSCULAR | Status: DC
  Filled 2019-12-18 (×8): qty 1

## 2019-12-18 MED ORDER — HYDRALAZINE HCL 50 MG PO TABS
50.0000 mg | ORAL_TABLET | Freq: Four times a day (QID) | ORAL | Status: DC
  Administered 2019-12-18 – 2019-12-21 (×12): 50 mg via ORAL
  Filled 2019-12-18 (×23): qty 1

## 2019-12-18 MED ORDER — HALOPERIDOL 5 MG PO TABS
5.0000 mg | ORAL_TABLET | Freq: Two times a day (BID) | ORAL | Status: DC
  Administered 2019-12-18 – 2019-12-20 (×4): 5 mg via ORAL
  Filled 2019-12-18 (×9): qty 1

## 2019-12-18 MED ORDER — OLANZAPINE 10 MG PO TBDP
10.0000 mg | ORAL_TABLET | Freq: Two times a day (BID) | ORAL | Status: DC
  Administered 2019-12-19 – 2019-12-20 (×3): 10 mg via ORAL
  Filled 2019-12-18 (×9): qty 1

## 2019-12-18 MED ORDER — CLONIDINE HCL 0.1 MG/24HR TD PTWK
0.1000 mg | MEDICATED_PATCH | TRANSDERMAL | Status: DC
  Filled 2019-12-18 (×3): qty 1

## 2019-12-18 NOTE — Progress Notes (Signed)
Pt observed in the dayroom at the beginning of the shift, the writer went to introduced herself to the pt, pt got up and went to her room and went to bed, refused to talk to the writer instead pt using curse words. Pt refused BP monitor, refused all her night medications stating she does not take medications at night. Pt explained the important of letting staff check her blood pressure since and taking her BP meds she has hx of HTN. Pt remains safe on the unit, will continue to monitor.

## 2019-12-18 NOTE — Progress Notes (Signed)
Staff asked pt to go down for breakfast. Pt said she do not go down they bring her tray to her."

## 2019-12-18 NOTE — Progress Notes (Signed)
Pt presents with paranoia and confusion.  Pt is irritated and gets upset when RN speaks to pt.  Pt took most of her morning medications, but she would not take her hydralazine because the "color is pink and I know you're poisoning me."    Pt's blood pressure remains elevated at 184/115.  RN notified MD of pt's status.  MD instructed RN to send pt to [facility] for evaluation.  RN spoke to charge nurse Stacy at [facility] and informed Kennyth Arnold of pt coming to [facility].

## 2019-12-18 NOTE — ED Notes (Signed)
Report given to Will at [facility] transport called to transport back to [facility]

## 2019-12-18 NOTE — Progress Notes (Signed)
[facility] MD Progress Note  12/18/2019 10:46 AM Erin Riley  MRN:  [id number] Subjective: Patient is a 69 year old female with a reported past psychiatric history significant for schizophrenia who originally presented to the [facility] emergency department on 12/15/2019 secondary for medical clearance.  The patient is not a good historian, but the patient presented with hallucinations.  She also was paranoid.  She apparently was noncompliant with her psychiatric medications.  Objective: Patient is seen and examined.  Patient is a 69 year old female with the above-stated past psychiatric history is seen in follow-up.  She remains not a good historian, but seems to be a little bit less agitated.  She continues to complain of paranoid thinking.  She wants to know why people are coming after her to get her money.  I was able to do more of Mini-Mental Status Examination on her today, and she is unsure of the year.  She remembers Erin Riley and Erin Riley, but no other presidents.  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  Her blood pressure remains significantly elevated at 174/106 and 187/106.  Pulse was between 70 and 62.  She is afebrile.  She only slept 3 hours last night.  Review of her laboratories revealed no new lab results.  Unfortunately nursing staff has been unable to obtain an EKG on her.  Her last EKG from 09/26/2019 showed right atrial enlargement, left ventricular hypertrophy but a sinus rhythm.  Her QTC was 477.  Principal Problem: <principal problem not specified> Diagnosis: Active Problems:   Schizophrenia (HCC)  Total Time spent with patient: 20 minutes  Past Psychiatric History: See admission H&P  Past Medical History:  Past Medical History:  Diagnosis Date  . Chronic back pain   . Chronic kidney disease (CKD), stage II (mild)    Class I-II  . Coronary artery disease 04/2009   50% stenosis in the perforator of  LAD; catheterization was for an abnormal Myoview in January 2000 showing anterior and inferolateral ischemia.  . Diverticulitis   . 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 > 3 subsequent Echos show improved/normal EF with moderate to severe concentric LVH and diastolic dysfunction with LVOT/intracavitary gradient --> 06/2016: Severe LVH.  Vigorous EF, 65-70%.?? Gr 1 DD. Mild AS.  [name] Hyperlipidemia   . Hypertension   . Hypertensive hypertrophic cardiomyopathy: NYHA class II:  Echo: Severe concentric LVH with LV OT gradient; essentially preserved EF with diastolic dysfunction 02/15/2013   Echo 06/2016: Severe Concentric LVH. Vigorous EF 65-70%. ~ Gr I DD.   . Mild aortic stenosis by prior echocardiography    Echo 06/2016: Mild AS (Mean Gradient 15 mmHg); has had prior Mod-Severe MR (not seen on current echo)  . PAD (peripheral artery disease) [facility]) March 2013   Lower extremity Dopplers: R. SFA 50-60%, R. PTA proximally occluded with distal reconstitution;; L. common iliac ~50%, L. SFA 50-70% stenosis, L. PTA < 50%  . Schizophrenia [facility])     Past Surgical History:  Procedure Laterality Date  . BUNIONECTOMY    . carotid doppler  05/29/2011   left bulb/prox ICA moderate amtfibrous plaque with no evidence significant reduction.,right bulb /proximal ICA normal patency  . lower extremity doppler  05/29/2011   right SFA 50% to 59% diameter reduction,right posterior tibal atreery occlusive disease,reconstituting distally, left common illiac<50%,left SFA 50 to70%,left post. tibial <50%  .  NM MYOCAR PERF WALL MOTION  03/2009   Persantine; EF 51%-both anterior and inferolateral ischemia  . TRANSTHORACIC ECHOCARDIOGRAM  06/2016   Severe LVH.  Vigorous EF of 65-70%.  No RWMA. ~Only grade 1 diastolic dysfunction.  Mild aortic stenosis (mean gradient 15 mmHg)  . TRANSTHORACIC ECHOCARDIOGRAM  07/2012   EF 50-55%;  severe concentric LVH; only grade 1 diastolic dysfunction. Mild aortic sclerosis - with LVOT /intracavitary gradient of roughly 20 mmHg mean. Mild to moderately dilated LA;; previously reported MR not seen   Family History:  Family History  Problem Relation Age of Onset  . Hypertension Mother   . Breast cancer Neg Hx    Family Psychiatric  History: See admission H&P Social History:  Social History   Substance and Sexual Activity  Alcohol Use No     Social History   Substance and Sexual Activity  Drug Use No    Social History   Socioeconomic History  . Marital status: Single    Spouse name: Not on file  . Number of children: 7  . Years of education: Not on file  . Highest education level: Not on file  Occupational History  . Not on file  Tobacco Use  . Smoking status: Former Smoker    Types: Cigarettes    Quit date: 05/14/2002    Years since quitting: 17.6  . Smokeless tobacco: Never Used  Vaping Use  . Vaping Use: Never used  Substance and Sexual Activity  . Alcohol use: No  . Drug use: No  . Sexual activity: Yes    Birth control/protection: Post-menopausal  Other Topics Concern  . Not on file  Social History Narrative   Now single mother of 2 with one grandchild. She quit smoking roughly 5 years ago and is not so since. She has also stopped drinking alcohol. She does try get routine exercise walking at least a mile 3-4 days a week.    She lives with her 68 year old mother. 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   Social Determinants of Health   Financial Resource Strain:   . Difficulty of Paying Living Expenses: Not on file  Food Insecurity:   . Worried About Programme researcher, broadcasting/film/videounning Out of Food in the Last Year: Not on file  . Ran Out of Food in the Last Year: Not on file  Transportation Needs:   . Lack of Transportation (Medical): Not on file  . Lack of Transportation (Non-Medical): Not on file  Physical Activity:   . Days of Exercise per Week: Not on file  . Minutes of  Exercise per Session: Not on file  Stress:   . Feeling of Stress : Not on file  Social Connections:   . Frequency of Communication with Friends and Family: Not on file  . Frequency of Social Gatherings with Friends and Family: Not on file  . Attends Religious Services: Not on file  . Active Member of Clubs or Organizations: Not on file  . Attends BankerClub or Organization Meetings: Not on file  . Marital Status: Not on file   Additional Social History:                         Sleep: Poor  Appetite:  Fair  Current Medications: Current Facility-Administered Medications  Medication Dose Route Frequency Provider Last Rate Last Admin  . acetaminophen (TYLENOL) tablet 650 mg  650 mg Oral Q6H PRN Leevy-Johnson, Brooke A, NP      . alum & mag hydroxide-simeth (  MAALOX/MYLANTA) 200-200-20 MG/5ML suspension 30 mL  30 mL Oral Q4H PRN Leevy-Johnson, Brooke A, NP      . carvedilol (COREG) tablet 25 mg  25 mg Oral BID WC Antonieta Pert, MD   25 mg at 12/18/19 0911  . hydrALAZINE (APRESOLINE) tablet 50 mg  50 mg Oral Q8H Antonieta Pert, MD   50 mg at 12/17/19 2131  . isosorbide mononitrate (IMDUR) 24 hr tablet 30 mg  30 mg Oral Daily Antonieta Pert, MD   30 mg at 12/18/19 0911  . magnesium hydroxide (MILK OF MAGNESIA) suspension 30 mL  30 mL Oral Daily PRN Leevy-Johnson, Brooke A, NP      . OLANZapine zydis (ZYPREXA) disintegrating tablet 10 mg  10 mg Oral QHS Antonieta Pert, MD   10 mg at 12/17/19 2131  . OLANZapine zydis (ZYPREXA) disintegrating tablet 5 mg  5 mg Oral Daily Antonieta Pert, MD   5 mg at 12/18/19 0912  . traZODone (DESYREL) tablet 50 mg  50 mg Oral QHS PRN Leevy-Johnson, Lina Sar, NP        Lab Results:  Results for orders placed or performed during the hospital encounter of 12/15/19 (from the past 48 hour(s))  Urinalysis, Routine w reflex microscopic Urine, Clean Catch     Status: Abnormal   Collection Time: 12/16/19  2:50 PM  Result Value Ref Range    Color, Urine YELLOW YELLOW   APPearance CLEAR CLEAR   Specific Gravity, Urine 1.011 1.005 - 1.030   pH 5.0 5.0 - 8.0   Glucose, UA NEGATIVE NEGATIVE mg/dL   Hgb urine dipstick NEGATIVE NEGATIVE   Bilirubin Urine NEGATIVE NEGATIVE   Ketones, ur NEGATIVE NEGATIVE mg/dL   Protein, ur 144 (A) NEGATIVE mg/dL   Nitrite NEGATIVE NEGATIVE   Leukocytes,Ua NEGATIVE NEGATIVE   RBC / HPF 0-5 0 - 5 RBC/hpf   WBC, UA 0-5 0 - 5 WBC/hpf   Bacteria, UA RARE (A) NONE SEEN   Squamous Epithelial / LPF 0-5 0 - 5   Mucus PRESENT    Hyaline Casts, UA PRESENT     Comment: Performed at [facility], [address] [place], [place] [address]    Blood Alcohol level:  Lab Results  Component Value Date   ETH <10 12/15/2019   ETH <10 08/07/2019    Metabolic Disorder Labs: Lab Results  Component Value Date   HGBA1C 5.9 (H) 06/09/2016   MPG 123 06/09/2016   MPG 105 07/31/2012   Lab Results  Component Value Date   PROLACTIN 16.5 06/09/2016   Lab Results  Component Value Date   CHOL 123 08/31/2017   TRIG 120 08/31/2017   HDL 40 08/31/2017   CHOLHDL 3.1 08/31/2017   VLDL 55 (H) 06/09/2016   LDLCALC 59 08/31/2017   LDLCALC 80 06/09/2016    Physical Findings: AIMS:  , ,  ,  ,    CIWA:    COWS:     Musculoskeletal: Strength & Muscle Tone: decreased Gait & Station: unsteady Patient leans: N/A  Psychiatric Specialty Exam: Physical Exam Vitals and nursing note reviewed.  HENT:     Head: Normocephalic and atraumatic.  Pulmonary:     Effort: Pulmonary effort is normal.  Neurological:     General: No focal deficit present.     Mental Status: She is alert.     Review of Systems  All other systems reviewed and are negative.   Blood pressure (!) 174/106, pulse 70, temperature  98 F (36.7 C), temperature source Oral, resp. rate 18, last menstrual period 05/10/2013, SpO2 98 %.There is no height or weight on file to calculate BMI.  General Appearance: Disheveled  Eye  Contact:  Minimal  Speech:  Normal Rate  Volume:  Normal  Mood:  Dysphoric  Affect:  Congruent  Thought Process:  Goal Directed and Descriptions of Associations: Circumstantial  Orientation:  Negative  Thought Content:  Delusions and Paranoid Ideation  Suicidal Thoughts:  No  Homicidal Thoughts:  No  Memory:  Immediate;   Poor Recent;   Poor Remote;   Poor  Judgement:  Impaired  Insight:  Lacking  Psychomotor Activity:  Normal  Concentration:  Concentration: Fair and Attention Span: Fair  Recall:  [employer] of Knowledge:  Poor  Language:  Fair  Akathisia:  Negative  Handed:  Right  AIMS (if indicated):     Assets:  Desire for Improvement Resilience  ADL's:  Impaired  Cognition:  Impaired,  Mild  Sleep:  Number of Hours: 3     Treatment Plan Summary: Daily contact with patient to assess and evaluate symptoms and progress in treatment, Medication management and Plan : Patient is seen and examined.  Patient is a 69 year old female with the above-stated past psychiatric history who is seen in follow-up.   Diagnosis: 1.  Schizophrenia 2.  Malignant hypertension 3.  Probable vascular dementia 4.  Prolonged QTc interval 5.  Right atrial enlargement 6.  Left ventricular hypertrophy 7.  Status post CVA  Pertinent findings on examination today: 1.  Paranoia continues 2.  Still remains confused, and only oriented to 2. 3.  Blood pressure remains elevated. 4.  Noncompliant with treatment including some medications and EKG  Plan: 1.  Continue carvedilol 25 mg p.o. twice daily with food for hypertension.  Unable to really increase any further secondary to pulse. 2.  Continue to attempt to get EKG. 3.  Increase hydralazine to 50 mg p.o. 4 times daily for hypertension. 4.  Continue imdur 30 mg p.o. daily for chest pain. 5.  Go on and increase her olanzapine to 10 mg p.o. twice daily for psychosis. 6.  Attempt to get laboratories for TSH, urinalysis, magnesium, lipid panel,  hemoglobin A1c as well as her EKG. 7.  Add laboratories for reversible dementia including RPR, folic acid and thiamine.  TSH as per above. 8.  Physical therapy consultation 9.  We will have social work inquire about living circumstances. 10.  Disposition planning-in progress.  Antonieta Pert, MD 12/18/2019, 10:46 AM

## 2019-12-18 NOTE — Progress Notes (Signed)
Pt returned from ER.  Pt continues to be paranoid and delusional.  Pt  is suspicious of this nurse and she would not take medications.  Another nurse was able to give pt pt's prescribed afternoon medications including haldol and blood pressure medications.  RN will continue to monitor and provide support as needed.

## 2019-12-18 NOTE — ED Provider Notes (Signed)
[facility] LONG EMERGENCY DEPARTMENT Provider Note  CSN: [id number] Arrival date & time: 12/18/19 1259    History Chief Complaint  Patient presents with  . Paranoid  . Hypertension    HPI  Erin Riley is a 69 y.o. female with history of HTN and CKD as well as schizophrenia was recently in the ED for behavioral concerns and admitted to [facility] yesterday was returned to the ED today because her BP has remained elevated at [facility]. She denies any CP but her history is difficult due to her ongoing underlying psychiatric issues. She has apparently been refusing some of her medications including BP meds at [facility] because she is worried they are trying to poison her.    Past Medical History:  Diagnosis Date  . Chronic back pain   . Chronic kidney disease (CKD), stage II (mild)    Class I-II  . Coronary artery disease 04/2009   50% stenosis in the perforator of LAD; catheterization was for an abnormal Myoview in January 2000 showing anterior and inferolateral ischemia.  . Diverticulitis   . 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 > 3 subsequent Echos show improved/normal EF with moderate to severe concentric LVH and diastolic dysfunction with LVOT/intracavitary gradient --> 06/2016: Severe LVH.  Vigorous EF, 65-70%.?? Gr 1 DD. Mild AS.  [name] Hyperlipidemia   . Hypertension   . Hypertensive hypertrophic cardiomyopathy: NYHA class II:  Echo: Severe concentric LVH with LV OT gradient; essentially preserved EF with diastolic dysfunction 02/15/2013   Echo 06/2016: Severe Concentric LVH. Vigorous EF 65-70%. ~ Gr I DD.   . Mild aortic stenosis by prior echocardiography    Echo 06/2016: Mild AS (Mean Gradient 15 mmHg); has had prior Mod-Severe MR (not seen on current echo)  . PAD (peripheral artery disease) [facility]) March 2013   Lower extremity Dopplers: R. SFA 50-60%, R. PTA proximally occluded with distal  reconstitution;; L. common iliac ~50%, L. SFA 50-70% stenosis, L. PTA < 50%  . Schizophrenia [facility])     Past Surgical History:  Procedure Laterality Date  . BUNIONECTOMY    . carotid doppler  05/29/2011   left bulb/prox ICA moderate amtfibrous plaque with no evidence significant reduction.,right bulb /proximal ICA normal patency  . lower extremity doppler  05/29/2011   right SFA 50% to 59% diameter reduction,right posterior tibal atreery occlusive disease,reconstituting distally, left common illiac<50%,left SFA 50 to70%,left post. tibial <50%  . NM MYOCAR PERF WALL MOTION  03/2009   Persantine; EF 51%-both anterior and inferolateral ischemia  . TRANSTHORACIC ECHOCARDIOGRAM  06/2016   Severe LVH.  Vigorous EF of 65-70%.  No RWMA. ~Only grade 1 diastolic dysfunction.  Mild aortic stenosis (mean gradient 15 mmHg)  . TRANSTHORACIC ECHOCARDIOGRAM  07/2012   EF 50-55%; severe concentric LVH; only grade 1 diastolic dysfunction. Mild aortic sclerosis - with LVOT /intracavitary gradient of roughly 20 mmHg mean. Mild to moderately dilated LA;; previously reported MR not seen    Family History  Problem Relation Age of Onset  . Hypertension Mother   . Breast cancer Neg Hx     Social History   Tobacco Use  . Smoking status: Former Smoker    Types: Cigarettes    Quit date: 05/14/2002    Years since quitting: 17.6  . Smokeless tobacco: Never Used  Vaping Use  . Vaping Use: Never used  Substance Use Topics  . Alcohol use: No  .  Drug use: No     Home Medications Prior to Admission medications   Medication Sig Start Date End Date Taking? Authorizing Provider  carvedilol (COREG) 25 MG tablet Take 1 tablet (25 mg total) by mouth 2 (two) times daily with a meal. 09/25/19   Farrel Gordon, PA-C  isosorbide mononitrate (IMDUR) 30 MG 24 hr tablet Take 1 tablet (30 mg total) by mouth daily. 09/25/19   Farrel Gordon, PA-C     Allergies    Patient has no known allergies.   Review of Systems     Review of Systems Unable to assess due to mental status.    Physical Exam BP (!) 202/102   Pulse 69   Temp 98.2 F (36.8 C) (Oral)   Resp 20   LMP 05/10/2013   SpO2 95%   Physical Exam Vitals and nursing note reviewed.  Constitutional:      Appearance: Normal appearance.  HENT:     Head: Normocephalic and atraumatic.     Nose: Nose normal.     Mouth/Throat:     Mouth: Mucous membranes are moist.  Eyes:     Extraocular Movements: Extraocular movements intact.     Conjunctiva/sclera: Conjunctivae normal.  Cardiovascular:     Rate and Rhythm: Normal rate.  Pulmonary:     Effort: Pulmonary effort is normal.     Breath sounds: Normal breath sounds.  Abdominal:     General: Abdomen is flat.     Palpations: Abdomen is soft.     Tenderness: There is no abdominal tenderness.  Musculoskeletal:        General: No swelling. Normal range of motion.     Cervical back: Neck supple.  Skin:    General: Skin is warm and dry.  Neurological:     General: No focal deficit present.     Mental Status: She is alert.  Psychiatric:     Comments: Paranoid, tangential, disorganized      ED Results / Procedures / Treatments   Labs (all labs ordered are listed, but only abnormal results are displayed) Labs Reviewed  URINALYSIS, COMPLETE (UACMP) WITH MICROSCOPIC - Abnormal; Notable for the following components:      Result Value   Protein, ur 100 (*)    Bacteria, UA RARE (*)    All other components within normal limits  PREGNANCY, URINE  PROLACTIN  TSH  RAPID URINE DRUG SCREEN, HOSP PERFORMED  HEMOGLOBIN A1C  LIPID PANEL  MAGNESIUM  BASIC METABOLIC PANEL  RPR  FOLATE  METHYLMALONIC ACID, SERUM    EKG None  Radiology No results found.  Procedures Procedures  Medications Ordered in the ED Medications  acetaminophen (TYLENOL) tablet 650 mg (has no administration in time range)  alum & mag hydroxide-simeth (MAALOX/MYLANTA) 200-200-20 MG/5ML suspension 30 mL (has no  administration in time range)  magnesium hydroxide (MILK OF MAGNESIA) suspension 30 mL (has no administration in time range)  traZODone (DESYREL) tablet 50 mg (has no administration in time range)  carvedilol (COREG) tablet 25 mg (25 mg Oral Given 12/18/19 0911)  isosorbide mononitrate (IMDUR) 24 hr tablet 30 mg (30 mg Oral Given by Other 12/18/19 0911)  hydrALAZINE (APRESOLINE) tablet 50 mg (has no administration in time range)  OLANZapine zydis (ZYPREXA) disintegrating tablet 10 mg (has no administration in time range)  pneumococcal 23 valent vaccine (PNEUMOVAX-23) injection 0.5 mL (has no administration in time range)     MDM Rules/Calculators/A&P MDM Patient brought to the ED from [facility] for persistent HTN at their  facility. She had labs done while here a few days ago without any signs of end-organ damage and she has not apparent symptoms of HTN today. I attempted to call the [facility] provider but did not get an answer. I have sent a secure chat to the [facility] MD that saw her earlier today for clarification on what they need [place] to do today. She does not have any indication for emergent BP lowering.  ED Course  I have reviewed the triage vital signs and the nursing notes.  Pertinent labs & imaging results that were available during my care of the patient were reviewed by me and considered in my medical decision making (see chart for details).  Clinical Course as of Dec 17 1425  Thu Dec 18, 2019  1422 Spoke with Dr. Jola Babinski at [facility] via secure chat. No concerning findings on her ED evaluation and I suspect her BP is elevated at baseline. I have suggested non-oral BP measures such as Clonidine patch to help with her non compliance. She is otherwise cleared to return to [facility].    [CS]    Clinical Course User Index [CS] Pollyann Savoy, MD    Final Clinical Impression(s) / ED Diagnoses Final diagnoses:  Schizophrenia, unspecified type (HCC)  Hypertension, unspecified type    Rx / DC Orders ED  Discharge Orders    None       Pollyann Savoy, MD 12/18/19 1427

## 2019-12-18 NOTE — ED Triage Notes (Signed)
Coming from [facility] for HTN-history of the same-states BP 187/112 although has not taken BP meds today-MD wanted patient sent for eval

## 2019-12-19 DIAGNOSIS — I10 Essential (primary) hypertension: Secondary | ICD-10-CM | POA: Diagnosis not present

## 2019-12-19 DIAGNOSIS — F203 Undifferentiated schizophrenia: Secondary | ICD-10-CM | POA: Diagnosis not present

## 2019-12-19 DIAGNOSIS — M79604 Pain in right leg: Secondary | ICD-10-CM

## 2019-12-19 DIAGNOSIS — M545 Low back pain, unspecified: Secondary | ICD-10-CM

## 2019-12-19 DIAGNOSIS — M79605 Pain in left leg: Secondary | ICD-10-CM

## 2019-12-19 DIAGNOSIS — E669 Obesity, unspecified: Secondary | ICD-10-CM

## 2019-12-19 DIAGNOSIS — I5189 Other ill-defined heart diseases: Secondary | ICD-10-CM | POA: Diagnosis not present

## 2019-12-19 DIAGNOSIS — I739 Peripheral vascular disease, unspecified: Secondary | ICD-10-CM

## 2019-12-19 DIAGNOSIS — E785 Hyperlipidemia, unspecified: Secondary | ICD-10-CM | POA: Diagnosis not present

## 2019-12-19 LAB — RPR: RPR Ser Ql: NONREACTIVE

## 2019-12-19 LAB — HEMOGLOBIN A1C
Hgb A1c MFr Bld: 5.9 % — ABNORMAL HIGH (ref 4.8–5.6)
Mean Plasma Glucose: 123 mg/dL

## 2019-12-19 NOTE — BHH Counselor (Signed)
CSW made 3 attempts to speak with [name] on 12/17/19, 12/18/19, and 12/19/19.  [name] was either asleep or refused all attempts.  CSW completed a summary note for assessment using information already gathered by other sources.

## 2019-12-19 NOTE — Progress Notes (Signed)
Patient denies SI, HI and AVH this shift.  Patient has had no incidents of behavioral dyscontrol this shift.  Patient has been active in the milieu and was engaged appropriately with peers.    Assess patient for safety, offer medications as prescribed, engage patient in 1:1 staff talks.   Patient able to contract for safety continue to monitor as planned.  

## 2019-12-19 NOTE — Evaluation (Signed)
Occupational Therapy Evaluation Patient Details Name: Erin Riley MRN: [id number] DOB: 1950/06/21 Today's Date: 12/19/2019    History of Present Illness Pt is a 69 y/o female with PMHx of schizophrenia admitted to [facility] and subsequently referred to OT for poor ambulation.   Clinical Impression   Patient was seen on the unit at [facility] for initial OT evaluation with a focus on addressing functional mobility and ADL performance. Upon approach, pt was found seated EOB in her bedroom, initially agreeable to meet with OT for evaluation. OT introduced self/role and purpose of session. Pt able to ambulate x 20 ft into the hallway from bedroom and back safely and without LOB. Gait appeared shuffled, though no LOB noted. OT asked patient about use of AE at home, to which pt became upset and increasingly paranoid around, refusing to engage further. Pt stated "questions like that are the reason I am like this" and subsequently closed the bedroom door in front of OT, terminating session.   Given current status, no immediate OT needs identified, will sign off at this time. Please feel free to reach back out, should further needs/concerns arise.     Rodman Pickle Demar Shad 12/19/2019, 2:31 PM

## 2019-12-19 NOTE — Progress Notes (Signed)
[facility] MD Progress Note  12/19/2019 5:09 PM Erin Riley  MRN:  [id number] Subjective: Patient is a 69 year old female with a reported past psychiatric history significant for schizophrenia who originally presented to the [facility] emergency department on 12/15/2019 secondary for medical clearance.  The patient is not a good historian, but the patient presented with hallucinations.  She also was paranoid.  She apparently was noncompliant with her psychiatric medications.  Objective: Patient is seen and examined.  Patient is a 69 year old female with the above-stated past psychiatric history is seen in follow-up.   Per nursing, patient had been refusing medications to include her antihypertensive medicine and blood pressure has run as high as 204/114.  Patient had been transported to the emergency department yesterday for evaluation with blood pressure medications recommended which have been added. Patient has mostly been secluded to her room, but was ambulating in hallway and did come to the nurses station this morning for medications.   During evaluation, she is laying in bed.  She is minimally verbal and makes poor eye contact.  She states that she is doing fine, eating okay and her mood is good.  She states that she lives with friends, but does not give consent for [place] to contact them at this time.  It is uncertain where she will discharge to.  She has not been agitated today.  When asked about her mental health diagnosis, she denies having a diagnosis.  She then shuts down to further questioning.  Her sleep has improved with nurses reporting 6.75 hours last night.  Review of her laboratories revealed no new lab results.   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  Her blood pressure remains significantly elevated.  Her heart rate is within normal limits. She is afebrile.  Her last EKG from 09/26/2019 showed right atrial enlargement, left  ventricular hypertrophy but a sinus rhythm.  Her QTC was 477.  Principal Problem: Schizophrenia (HCC) Diagnosis: Principal Problem:   Schizophrenia (HCC) Active Problems:   Essential hypertension   Low back pain radiating to both legs   Obesity (BMI 30-39.9)   PAD (peripheral artery disease) (HCC)   Left ventricular diastolic dysfunction, NYHA class 1   Hyperlipidemia with target LDL less than 100  Total Time spent with patient: 30 minutes  Past Psychiatric History: See admission H&P  Past Medical History:  Past Medical History:  Diagnosis Date  . Chronic back pain   . Chronic kidney disease (CKD), stage II (mild)    Class I-II  . Coronary artery disease 04/2009   50% stenosis in the perforator of LAD; catheterization was for an abnormal Myoview in January 2000 showing anterior and inferolateral ischemia.  . Diverticulitis   . 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 > 3 subsequent Echos show improved/normal EF with moderate to severe concentric LVH and diastolic dysfunction with LVOT/intracavitary gradient --> 06/2016: Severe LVH.  Vigorous EF, 65-70%.?? Gr 1 DD. Mild AS.  [name] Hyperlipidemia   . Hypertension   . Hypertensive hypertrophic cardiomyopathy: NYHA class II:  Echo: Severe concentric LVH with LV OT gradient; essentially preserved EF with diastolic dysfunction 02/15/2013   Echo 06/2016: Severe Concentric LVH. Vigorous EF 65-70%. ~ Gr I DD.   . Mild aortic stenosis by prior echocardiography    Echo 06/2016: Mild AS (Mean Gradient 15 mmHg); has had  prior Mod-Severe MR (not seen on current echo)  . PAD (peripheral artery disease) [facility]) March 2013   Lower extremity Dopplers: R. SFA 50-60%, R. PTA proximally occluded with distal reconstitution;; L. common iliac ~50%, L. SFA 50-70% stenosis, L. PTA < 50%  . Schizophrenia [facility])     Past Surgical History:  Procedure Laterality Date  .  BUNIONECTOMY    . carotid doppler  05/29/2011   left bulb/prox ICA moderate amtfibrous plaque with no evidence significant reduction.,right bulb /proximal ICA normal patency  . lower extremity doppler  05/29/2011   right SFA 50% to 59% diameter reduction,right posterior tibal atreery occlusive disease,reconstituting distally, left common illiac<50%,left SFA 50 to70%,left post. tibial <50%  . NM MYOCAR PERF WALL MOTION  03/2009   Persantine; EF 51%-both anterior and inferolateral ischemia  . TRANSTHORACIC ECHOCARDIOGRAM  06/2016   Severe LVH.  Vigorous EF of 65-70%.  No RWMA. ~Only grade 1 diastolic dysfunction.  Mild aortic stenosis (mean gradient 15 mmHg)  . TRANSTHORACIC ECHOCARDIOGRAM  07/2012   EF 50-55%; severe concentric LVH; only grade 1 diastolic dysfunction. Mild aortic sclerosis - with LVOT /intracavitary gradient of roughly 20 mmHg mean. Mild to moderately dilated LA;; previously reported MR not seen   Family History:  Family History  Problem Relation Age of Onset  . Hypertension Mother   . Breast cancer Neg Hx    Family Psychiatric  History: See admission H&P   Social History:  Social History   Substance and Sexual Activity  Alcohol Use No     Social History   Substance and Sexual Activity  Drug Use No    Social History   Socioeconomic History  . Marital status: Single    Spouse name: Not on file  . Number of children: 7  . Years of education: Not on file  . Highest education level: Not on file  Occupational History  . Not on file  Tobacco Use  . Smoking status: Former Smoker    Types: Cigarettes    Quit date: 05/14/2002    Years since quitting: 17.6  . Smokeless tobacco: Never Used  Vaping Use  . Vaping Use: Never used  Substance and Sexual Activity  . Alcohol use: No  . Drug use: No  . Sexual activity: Yes    Birth control/protection: Post-menopausal  Other Topics Concern  . Not on file  Social History Narrative   Now single mother of 2 with one  grandchild. She quit smoking roughly 5 years ago and is not so since. She has also stopped drinking alcohol. She does try get routine exercise walking at least a mile 3-4 days a week.    She lives with her 15 year old mother. 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   Social Determinants of Health   Financial Resource Strain:   . Difficulty of Paying Living Expenses: Not on file  Food Insecurity:   . Worried About Programme researcher, broadcasting/film/video in the Last Year: Not on file  . Ran Out of Food in the Last Year: Not on file  Transportation Needs:   . Lack of Transportation (Medical): Not on file  . Lack of Transportation (Non-Medical): Not on file  Physical Activity:   . Days of Exercise per Week: Not on file  . Minutes of Exercise per Session: Not on file  Stress:   . Feeling of Stress : Not on file  Social Connections:   . Frequency of Communication with Friends and Family: Not on file  . Frequency of  Social Gatherings with Friends and Family: Not on file  . Attends Religious Services: Not on file  . Active Member of Clubs or Organizations: Not on file  . Attends BankerClub or Organization Meetings: Not on file  . Marital Status: Not on file   Additional Social History:             Reports that she has been living with her friends            Sleep: Improving  Appetite:  Fair  Current Medications: Current Facility-Administered Medications  Medication Dose Route Frequency Provider Last Rate Last Admin  . acetaminophen (TYLENOL) tablet 650 mg  650 mg Oral Q6H PRN Leevy-Johnson, Brooke A, NP      . alum & mag hydroxide-simeth (MAALOX/MYLANTA) 200-200-20 MG/5ML suspension 30 mL  30 mL Oral Q4H PRN Leevy-Johnson, Brooke A, NP      . carvedilol (COREG) tablet 25 mg  25 mg Oral BID WC Antonieta Pertlary, Greg Lawson, MD   25 mg at 12/19/19 1002  . cloNIDine (CATAPRES - Dosed in mg/24 hr) patch 0.1 mg  0.1 mg Transdermal Weekly Antonieta Pertlary, Greg Lawson, MD      . haloperidol (HALDOL) tablet 5 mg  5 mg Oral BID Antonieta Pertlary,  Greg Lawson, MD   5 mg at 12/19/19 1004   Or  . haloperidol lactate (HALDOL) injection 5 mg  5 mg Intramuscular BID Antonieta Pertlary, Greg Lawson, MD      . hydrALAZINE (APRESOLINE) tablet 50 mg  50 mg Oral Q6H Antonieta Pertlary, Greg Lawson, MD   50 mg at 12/19/19 1004  . isosorbide mononitrate (IMDUR) 24 hr tablet 30 mg  30 mg Oral Daily Antonieta Pertlary, Greg Lawson, MD   30 mg at 12/19/19 1004  . magnesium hydroxide (MILK OF MAGNESIA) suspension 30 mL  30 mL Oral Daily PRN Leevy-Johnson, Brooke A, NP      . OLANZapine zydis (ZYPREXA) disintegrating tablet 10 mg  10 mg Oral BID Antonieta Pertlary, Greg Lawson, MD   10 mg at 12/19/19 1005  . pneumococcal 23 valent vaccine (PNEUMOVAX-23) injection 0.5 mL  0.5 mL Intramuscular Tomorrow-1000 Lorre NickAllen, Anthony, MD      . traZODone (DESYREL) tablet 50 mg  50 mg Oral QHS PRN Leevy-Johnson, Lina SarBrooke A, NP        Lab Results:  Results for orders placed or performed during the hospital encounter of 12/16/19 (from the past 48 hour(s))  Pregnancy, urine     Status: None   Collection Time: 12/18/19  1:24 PM  Result Value Ref Range   Preg Test, Ur NEGATIVE NEGATIVE    Comment:        THE SENSITIVITY OF THIS METHODOLOGY IS >20 mIU/mL. Performed at [facility], [address] [place], [place] [id number]   Urinalysis, Complete w Microscopic Urine, Clean Catch     Status: Abnormal   Collection Time: 12/18/19  1:24 PM  Result Value Ref Range   Color, Urine YELLOW YELLOW   APPearance CLEAR CLEAR   Specific Gravity, Urine 1.019 1.005 - 1.030   pH 5.0 5.0 - 8.0   Glucose, UA NEGATIVE NEGATIVE mg/dL   Hgb urine dipstick NEGATIVE NEGATIVE   Bilirubin Urine NEGATIVE NEGATIVE   Ketones, ur NEGATIVE NEGATIVE mg/dL   Protein, ur 604100 (A) NEGATIVE mg/dL   Nitrite NEGATIVE NEGATIVE   Leukocytes,Ua NEGATIVE NEGATIVE   RBC / HPF 0-5 0 - 5 RBC/hpf   WBC, UA 0-5 0 - 5 WBC/hpf   Bacteria, UA RARE (A) NONE SEEN  Squamous Epithelial / LPF 0-5 0 - 5   Mucus PRESENT    Hyaline Casts, UA  PRESENT     Comment: Performed at [facility], [address], [place], [place] [address]  Urine rapid drug screen (hosp performed)not at [facility]     Status: None   Collection Time: 12/18/19  1:24 PM  Result Value Ref Range   Opiates NONE DETECTED NONE DETECTED   Cocaine NONE DETECTED NONE DETECTED   Benzodiazepines NONE DETECTED NONE DETECTED   Amphetamines NONE DETECTED NONE DETECTED   Tetrahydrocannabinol NONE DETECTED NONE DETECTED   Barbiturates NONE DETECTED NONE DETECTED    Comment: (NOTE) DRUG SCREEN FOR MEDICAL PURPOSES ONLY.  IF CONFIRMATION IS NEEDED FOR ANY PURPOSE, NOTIFY LAB WITHIN 5 DAYS.  LOWEST DETECTABLE LIMITS FOR URINE DRUG SCREEN Drug Class                     Cutoff (ng/mL) Amphetamine and metabolites    1000 Barbiturate and metabolites    200 Benzodiazepine                 200 Tricyclics and metabolites     300 Opiates and metabolites        300 Cocaine and metabolites        300 THC                            50 Performed at [facility], [address] [place], [place] [address]   TSH     Status: None   Collection Time: 12/18/19  6:39 PM  Result Value Ref Range   TSH 1.253 0.350 - 4.500 uIU/mL    Comment: Performed by a 3rd Generation assay with a functional sensitivity of <=0.01 uIU/mL. Performed at [facility], [address] [place], [place] [address]   Hemoglobin A1c     Status: Abnormal   Collection Time: 12/18/19  6:39 PM  Result Value Ref Range   Hgb A1c MFr Bld 5.9 (H) 4.8 - 5.6 %    Comment: (NOTE)         Prediabetes: 5.7 - 6.4         Diabetes: >6.4         Glycemic control for adults with diabetes: <7.0    Mean Plasma Glucose 123 mg/dL    Comment: (NOTE) Performed At: [facility] [address] [place], [place] [id number] Jolene Schimke MD [id number]   Lipid panel     Status: Abnormal   Collection Time: 12/18/19  6:39 PM  Result Value Ref Range    Cholesterol 188 0 - 200 mg/dL   Triglycerides 364 (H) <150 mg/dL   HDL 38 (L) >68 mg/dL   Total CHOL/HDL Ratio 4.9 RATIO   VLDL 51 (H) 0 - 40 mg/dL   LDL Cholesterol 99 0 - 99 mg/dL    Comment:        Total Cholesterol/HDL:CHD Risk Coronary Heart Disease Risk Table                     Men   Women  1/2 Average Risk   3.4   3.3  Average Risk       5.0   4.4  2 X Average Risk   9.6   7.1  3 X Average Risk  23.4   11.0        Use the  calculated Patient Ratio above and the CHD Risk Table to determine the patient's CHD Risk.        ATP III CLASSIFICATION (LDL):  <100     mg/dL   Optimal  638-756  mg/dL   Near or Above                    Optimal  130-159  mg/dL   Borderline  433-295  mg/dL   High  >188     mg/dL   Very High Performed at [facility], [address], [place], [place] [address]   Magnesium     Status: None   Collection Time: 12/18/19  6:39 PM  Result Value Ref Range   Magnesium 2.3 1.7 - 2.4 mg/dL    Comment: Performed at [facility], [address], [place], [place] [address]  Basic metabolic panel     Status: Abnormal   Collection Time: 12/18/19  6:39 PM  Result Value Ref Range   Sodium 141 135 - 145 mmol/L   Potassium 3.9 3.5 - 5.1 mmol/L   Chloride 110 98 - 111 mmol/L   CO2 23 22 - 32 mmol/L   Glucose, Bld 140 (H) 70 - 99 mg/dL    Comment: Glucose reference range applies only to samples taken after fasting for at least 8 hours.   BUN 25 (H) 8 - 23 mg/dL   Creatinine, Ser 0.10 (H) 0.44 - 1.00 mg/dL   Calcium 9.2 8.9 - 93.2 mg/dL   GFR, Estimated 39 (L) >60 mL/min   Anion gap 8 5 - 15    Comment: Performed at [facility], [address] [place], [place] [address]  RPR     Status: None   Collection Time: 12/18/19  6:39 PM  Result Value Ref Range   RPR Ser Ql NON REACTIVE NON REACTIVE    Comment: Performed at [facility], [address], [place], [place] [address]  Folate, serum, performed  at [facility] lab     Status: None   Collection Time: 12/18/19  6:39 PM  Result Value Ref Range   Folate 9.4 >5.9 ng/mL    Comment: Performed at [facility], [address] [place], [place] [address]    Blood Alcohol level:  Lab Results  Component Value Date   ETH <10 12/15/2019   ETH <10 08/07/2019    Metabolic Disorder Labs: Lab Results  Component Value Date   HGBA1C 5.9 (H) 12/18/2019   MPG 123 12/18/2019   MPG 123 06/09/2016   Lab Results  Component Value Date   PROLACTIN 16.5 06/09/2016   Lab Results  Component Value Date   CHOL 188 12/18/2019   TRIG 256 (H) 12/18/2019   HDL 38 (L) 12/18/2019   CHOLHDL 4.9 12/18/2019   VLDL 51 (H) 12/18/2019   LDLCALC 99 12/18/2019   LDLCALC 59 08/31/2017    Physical Findings: AIMS:  , ,  ,  ,    CIWA:    COWS:     Musculoskeletal: Strength & Muscle Tone: decreased Gait & Station: unsteady Patient leans: N/A  Psychiatric Specialty Exam: Physical Exam Vitals and nursing note reviewed.  HENT:     Head: Normocephalic and atraumatic.  Pulmonary:     Effort: Pulmonary effort is normal.  Neurological:     General: No focal deficit present.     Mental Status: She is alert.     Review of Systems  All other systems reviewed and are negative.   Blood pressure (!) 204/114, pulse 72, temperature 98.6 F (37 C), temperature source Oral, resp. rate 18, last menstrual period 05/10/2013, SpO2 100 %.There is no height or weight on file to calculate BMI.  General Appearance: Disheveled  Eye Contact:  Minimal  Speech:  Garbled and Normal Rate  Volume:  Normal  Mood:  Dysphoric  Affect:  Congruent  Thought Process:  Goal Directed and Descriptions of Associations: Circumstantial  Orientation:  Negative  Thought Content:  Delusions and Paranoid Ideation  Suicidal Thoughts:  No  Homicidal Thoughts:  No  Memory:  Immediate;   Poor Recent;   Poor Remote;   Poor  Judgement:  Impaired  Insight:  Lacking   Psychomotor Activity:  Normal  Concentration:  Concentration: Fair and Attention Span: Fair  Recall:  [employer] of Knowledge:  Poor  Language:  Fair  Akathisia:  Negative  Handed:  Right  AIMS (if indicated):     Assets:  Desire for Improvement Resilience  ADL's:  Impaired  Cognition:  Impaired,  Mild  Sleep:  Number of Hours: 6.75     Treatment Plan Summary: Daily contact with patient to assess and evaluate symptoms and progress in treatment, Medication management and Plan : Patient is seen and examined.  Patient is a 69 year old female with the above-stated past psychiatric history who is seen in follow-up.   Diagnosis: 1.  Schizophrenia 2.  Malignant hypertension 3.  Probable vascular dementia 4.  Prolonged QTc interval 5.  Right atrial enlargement 6.  Left ventricular hypertrophy 7.  Status post CVA  Pertinent findings on examination today: 1.  Paranoia continues 2.  Still remains confused, and only oriented to 2. 3.  Blood pressure remains elevated. 4.  Noncompliant with treatment including some medications and EKG  Plan: 1.  Continue carvedilol 25 mg p.o. twice daily with food for hypertension.  Unable to really increase any further secondary to pulse. 2.  Continue to attempt to get EKG. 3.  Continue hydralazine to 50 mg p.o. 4 times daily for hypertension. 4.  Continue imdur 30 mg p.o. daily for chest pain. 5.  Continue olanzapine to 10 mg p.o. twice daily for psychosis. 6.  Reviewed laboratories for TSH, urinalysis, magnesium, lipid panel, hemoglobin A1c and for reversible dementia including RPR, folic acid and thiamine, TSH, as well as her EKG. -Methylmalonic acid pending -Folate within normal limits -RPR nonreactive -BMP with elevated blood glucose at 140, BUN 25, creatinine 1.4, and decreased GFR 39 -Magnesium normal -Lipid panel with elevated triglycerides, and VLDL, low HDL - Hemoglobin Z6X 5.9 -TSH 1.253 -CBC within normal limits -Prolactin  pending -UDS negative, urine pregnancy negative, UA with protein and rare bacteria  7.  Need consent for collateral 8.  Physical therapy consultation for possible FiO2 for discharge 9.  We will have social work inquire about living circumstances. 10.  Disposition planning-in progress.-Recommend Mini-Mental status exam to evaluate cognitive function to assist in placement.  Mariel Craft, MD 12/19/2019, 5:09 PM
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
## 2019-12-19 NOTE — Progress Notes (Signed)
Pt said she do not have a damn thing to say about her day. She will not attend wrap up group' pt went to her room.

## 2019-12-19 NOTE — [facility] Suicide Risk Assessment (Signed)
[facility] INPATIENT:  Family/Significant Other Suicide Prevention Education  Suicide Prevention Education:  Patient Refusal for Family/Significant Other Suicide Prevention Education: The patient Erin Riley has refused to provide written consent for family/significant other to be provided Family/Significant Other Suicide Prevention Education during admission and/or prior to discharge.  Physician notified.  CSW completed with Pt.  Pamphlet was provided for patient's review.    Metro Kung Yassir Enis 12/19/2019, 1:42 PM

## 2019-12-19 NOTE — Progress Notes (Signed)
Pt complained about the q 15 min checks, refusing for staff to go to her room. Pt is non compliant with care, will continue to monitor.

## 2019-12-20 DIAGNOSIS — F203 Undifferentiated schizophrenia: Secondary | ICD-10-CM | POA: Diagnosis not present

## 2019-12-20 LAB — PROLACTIN: Prolactin: 27.3 ng/mL — ABNORMAL HIGH (ref 4.8–23.3)

## 2019-12-20 MED ORDER — OLANZAPINE 10 MG PO TBDP
20.0000 mg | ORAL_TABLET | Freq: Every day | ORAL | Status: DC
  Administered 2019-12-20 – 2019-12-21 (×2): 20 mg via ORAL
  Filled 2019-12-20 (×6): qty 2

## 2019-12-20 MED ORDER — HALOPERIDOL LACTATE 5 MG/ML IJ SOLN: 5.0000 mg | Freq: Two times a day (BID) | INTRAMUSCULAR | Status: DC | PRN

## 2019-12-20 MED ORDER — HALOPERIDOL 5 MG PO TABS: 5.0000 mg | ORAL_TABLET | Freq: Two times a day (BID) | ORAL | Status: DC | PRN

## 2019-12-20 MED ORDER — OLANZAPINE 10 MG PO TBDP
10.0000 mg | ORAL_TABLET | Freq: Every day | ORAL | Status: DC
  Administered 2019-12-21: 10 mg via ORAL
  Filled 2019-12-20 (×4): qty 1

## 2019-12-20 MED ORDER — CLONIDINE HCL 0.1 MG/24HR TD PTWK
0.1000 mg | MEDICATED_PATCH | TRANSDERMAL | Status: DC
  Filled 2019-12-20: qty 1

## 2019-12-20 MED ORDER — CLONIDINE HCL 0.1 MG PO TABS
0.1000 mg | ORAL_TABLET | Freq: Three times a day (TID) | ORAL | Status: DC
  Administered 2019-12-20 – 2019-12-21 (×4): 0.1 mg via ORAL
  Filled 2019-12-20 (×15): qty 1

## 2019-12-20 NOTE — Progress Notes (Signed)
[facility] MD Progress Note  12/20/2019 4:52 PM Erin Riley  MRN:  [id number] Subjective:  Patient is a 69 year old female with a reported past psychiatric history significant for schizophrenia who originally presented to the [facility] emergency department on 12/15/2019 secondary for medical clearance.  The patient is not a good historian, but the patient presented with hallucinations.  She also was paranoid.  She apparently was noncompliant with her psychiatric medications.  [name] found lying in bed. She has been observed responding to internal stimuli today and heard by this writer laughing and talking to herself alone in her room. Per nursing report she has been irritable, cursing, and appears paranoid. On assessment she is irritable, poorly cooperative, and requesting discharge. She denies AVH. She denies SI/HI. She denies paranoid ideation. No delusional thought content expressed. She is med compliant. She refuses to cooperate with further questions and declines to answer orientation and memory questions.  Principal Problem: Schizophrenia (HCC) Diagnosis: Principal Problem:   Schizophrenia (HCC) Active Problems:   Essential hypertension   Low back pain radiating to both legs   Obesity (BMI 30-39.9)   PAD (peripheral artery disease) (HCC)   Left ventricular diastolic dysfunction, NYHA class 1   Hyperlipidemia with target LDL less than 100  Total Time spent with patient: 15 minutes  Past Psychiatric History: See admission H&P  Past Medical History:  Past Medical History:  Diagnosis Date  . Chronic back pain   . Chronic kidney disease (CKD), stage II (mild)    Class I-II  . Coronary artery disease 04/2009   50% stenosis in the perforator of LAD; catheterization was for an abnormal Myoview in January 2000 showing anterior and inferolateral ischemia.  . Diverticulitis   . 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 > 3 subsequent Echos show improved/normal EF with moderate to severe concentric LVH and diastolic dysfunction with LVOT/intracavitary gradient --> 06/2016: Severe LVH.  Vigorous EF, 65-70%.?? Gr 1 DD. Mild AS.  [name] Hyperlipidemia   . Hypertension   . Hypertensive hypertrophic cardiomyopathy: NYHA class II:  Echo: Severe concentric LVH with LV OT gradient; essentially preserved EF with diastolic dysfunction 02/15/2013   Echo 06/2016: Severe Concentric LVH. Vigorous EF 65-70%. ~ Gr I DD.   . Mild aortic stenosis by prior echocardiography    Echo 06/2016: Mild AS (Mean Gradient 15 mmHg); has had prior Mod-Severe MR (not seen on current echo)  . PAD (peripheral artery disease) [facility]) March 2013   Lower extremity Dopplers: R. SFA 50-60%, R. PTA proximally occluded with distal reconstitution;; L. common iliac ~50%, L. SFA 50-70% stenosis, L. PTA < 50%  . Schizophrenia [facility])     Past Surgical History:  Procedure Laterality Date  . BUNIONECTOMY    . carotid doppler  05/29/2011   left bulb/prox ICA moderate amtfibrous plaque with no evidence significant reduction.,right bulb /proximal ICA normal patency  . lower extremity doppler  05/29/2011   right SFA 50% to 59% diameter reduction,right posterior tibal atreery occlusive disease,reconstituting distally, left common illiac<50%,left SFA 50 to70%,left post. tibial <50%  . NM MYOCAR PERF WALL MOTION  03/2009   Persantine; EF 51%-both anterior and inferolateral ischemia  . TRANSTHORACIC ECHOCARDIOGRAM  06/2016   Severe LVH.  Vigorous EF of 65-70%.  No RWMA. ~Only grade 1 diastolic dysfunction.  Mild aortic stenosis (mean gradient 15 mmHg)  . TRANSTHORACIC ECHOCARDIOGRAM  07/2012   EF 50-55%;  severe concentric LVH; only grade 1 diastolic dysfunction. Mild aortic sclerosis - with LVOT /intracavitary gradient of roughly 20 mmHg mean. Mild to moderately dilated LA;; previously reported MR not seen   Family History:   Family History  Problem Relation Age of Onset  . Hypertension Mother   . Breast cancer Neg Hx    Family Psychiatric  History: See admission H&P Social History:  Social History   Substance and Sexual Activity  Alcohol Use No     Social History   Substance and Sexual Activity  Drug Use No    Social History   Socioeconomic History  . Marital status: Single    Spouse name: Not on file  . Number of children: 7  . Years of education: Not on file  . Highest education level: Not on file  Occupational History  . Not on file  Tobacco Use  . Smoking status: Former Smoker    Types: Cigarettes    Quit date: 05/14/2002    Years since quitting: 17.6  . Smokeless tobacco: Never Used  Vaping Use  . Vaping Use: Never used  Substance and Sexual Activity  . Alcohol use: No  . Drug use: No  . Sexual activity: Yes    Birth control/protection: Post-menopausal  Other Topics Concern  . Not on file  Social History Narrative   Now single mother of 2 with one grandchild. She quit smoking roughly 5 years ago and is not so since. She has also stopped drinking alcohol. She does try get routine exercise walking at least a mile 3-4 days a week.    She lives with her 99 year old mother. 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   Social Determinants of Health   Financial Resource Strain:   . Difficulty of Paying Living Expenses: Not on file  Food Insecurity:   . Worried About Programme researcher, broadcasting/film/video in the Last Year: Not on file  . Ran Out of Food in the Last Year: Not on file  Transportation Needs:   . Lack of Transportation (Medical): Not on file  . Lack of Transportation (Non-Medical): Not on file  Physical Activity:   . Days of Exercise per Week: Not on file  . Minutes of Exercise per Session: Not on file  Stress:   . Feeling of Stress : Not on file  Social Connections:   . Frequency of Communication with Friends and Family: Not on file  . Frequency of Social Gatherings with Friends and  Family: Not on file  . Attends Religious Services: Not on file  . Active Member of Clubs or Organizations: Not on file  . Attends Banker Meetings: Not on file  . Marital Status: Not on file   Additional Social History:                         Sleep: Fair  Appetite:  Fair  Current Medications: Current Facility-Administered Medications  Medication Dose Route Frequency Provider Last Rate Last Admin  . acetaminophen (TYLENOL) tablet 650 mg  650 mg Oral Q6H PRN Leevy-Johnson, Brooke A, NP      . alum & mag hydroxide-simeth (MAALOX/MYLANTA) 200-200-20 MG/5ML suspension 30 mL  30 mL Oral Q4H PRN Leevy-Johnson, Brooke A, NP      . carvedilol (COREG) tablet 25 mg  25 mg Oral BID WC Antonieta Pert, MD   25 mg at 12/20/19 0827  . cloNIDine (CATAPRES - Dosed in mg/24 hr) patch 0.1 mg  0.1 mg Transdermal Weekly Antonieta Pertlary, Greg Lawson, MD      . haloperidol (HALDOL) tablet 5 mg  5 mg Oral BID PRN Aldean BakerSykes, Jerimy Johanson E, NP       Or  . haloperidol lactate (HALDOL) injection 5 mg  5 mg Intramuscular BID PRN Aldean BakerSykes, Tiann Saha E, NP      . hydrALAZINE (APRESOLINE) tablet 50 mg  50 mg Oral Q6H Antonieta Pertlary, Greg Lawson, MD   50 mg at 12/20/19 1156  . isosorbide mononitrate (IMDUR) 24 hr tablet 30 mg  30 mg Oral Daily Antonieta Pertlary, Greg Lawson, MD   30 mg at 12/20/19 0826  . magnesium hydroxide (MILK OF MAGNESIA) suspension 30 mL  30 mL Oral Daily PRN Leevy-Johnson, Brooke A, NP      . Melene Muller[START ON 12/21/2019] OLANZapine zydis (ZYPREXA) disintegrating tablet 10 mg  10 mg Oral Daily Aldean BakerSykes, Kylan Veach E, NP      . OLANZapine zydis (ZYPREXA) disintegrating tablet 20 mg  20 mg Oral QHS Aldean BakerSykes, Yoceline Bazar E, NP      . pneumococcal 23 valent vaccine (PNEUMOVAX-23) injection 0.5 mL  0.5 mL Intramuscular Tomorrow-1000 Lorre NickAllen, Anthony, MD      . traZODone (DESYREL) tablet 50 mg  50 mg Oral QHS PRN Leevy-Johnson, Brooke A, NP   50 mg at 12/19/19 2042    Lab Results:  Results for orders placed or performed during the hospital  encounter of 12/16/19 (from the past 48 hour(s))  Prolactin     Status: Abnormal   Collection Time: 12/18/19  6:39 PM  Result Value Ref Range   Prolactin 27.3 (H) 4.8 - 23.3 ng/mL    Comment: (NOTE) Performed At: [facility] [address] [place], [place] [id number] Jolene SchimkeNagendra Sanjai MD WU:[id number]:[phone]   TSH     Status: None   Collection Time: 12/18/19  6:39 PM  Result Value Ref Range   TSH 1.253 0.350 - 4.500 uIU/mL    Comment: Performed by a 3rd Generation assay with a functional sensitivity of <=0.01 uIU/mL. Performed at [facility], [address] [place], [place] [address]   Hemoglobin A1c     Status: Abnormal   Collection Time: 12/18/19  6:39 PM  Result Value Ref Range   Hgb A1c MFr Bld 5.9 (H) 4.8 - 5.6 %    Comment: (NOTE)         Prediabetes: 5.7 - 6.4         Diabetes: >6.4         Glycemic control for adults with diabetes: <7.0    Mean Plasma Glucose 123 mg/dL    Comment: (NOTE) Performed At: [facility]  [address] [place], [place] [id number] Jolene SchimkeNagendra Sanjai MD [id number]:[phone]   Lipid panel     Status: Abnormal   Collection Time: 12/18/19  6:39 PM  Result Value Ref Range   Cholesterol 188 0 - 200 mg/dL   Triglycerides 401256 (H) <150 mg/dL   HDL 38 (L) >02>40 mg/dL   Total CHOL/HDL Ratio 4.9 RATIO   VLDL 51 (H) 0 - 40 mg/dL   LDL Cholesterol 99 0 - 99 mg/dL    Comment:        Total Cholesterol/HDL:CHD Risk Coronary Heart Disease Risk Table                     Men   Women  1/2 Average Risk   3.4   3.3  Average Risk       5.0   4.4  2 X Average Risk   9.6   7.1  3 X Average Risk  23.4   11.0        Use the calculated Patient Ratio above and the CHD Risk Table to determine the patient's CHD Risk.        ATP III CLASSIFICATION (LDL):  <100     mg/dL   Optimal  710-626  mg/dL   Near or Above                    Optimal  130-159  mg/dL   Borderline  948-546  mg/dL   High  >270     mg/dL   Very High Performed at [facility], [address] [place], [place] [address]   Magnesium     Status: None   Collection Time: 12/18/19  6:39 PM  Result Value Ref Range   Magnesium 2.3 1.7 - 2.4 mg/dL    Comment: Performed at [facility], [address] [place], [place] [address]  Basic metabolic panel     Status: Abnormal   Collection Time: 12/18/19  6:39 PM  Result Value Ref Range   Sodium 141 135 - 145 mmol/L   Potassium 3.9 3.5 - 5.1 mmol/L   Chloride 110 98 - 111 mmol/L   CO2 23 22 - 32 mmol/L   Glucose, Bld 140 (H) 70 - 99 mg/dL    Comment: Glucose reference range applies only to samples taken after fasting for at least 8 hours.   BUN 25 (H) 8 - 23 mg/dL   Creatinine, Ser 9.93 (H) 0.44 - 1.00 mg/dL   Calcium 9.2 8.9 - 71.6 mg/dL   GFR, Estimated 39 (L) >60 mL/min   Anion gap 8 5 - 15    Comment: Performed at [facility], [address], [place], [place] [address]  RPR     Status: None   Collection Time: 12/18/19  6:39 PM  Result Value Ref Range   RPR Ser Ql NON REACTIVE NON REACTIVE    Comment: Performed at [facility], [address] [place], [place] [address]  Folate, serum, performed at [facility] lab     Status: None   Collection Time: 12/18/19  6:39 PM  Result Value Ref Range   Folate 9.4 >5.9 ng/mL    Comment: Performed at [facility], [address], [place], [place] [address]    Blood Alcohol level:  Lab Results  Component Value Date   ETH <10 12/15/2019   ETH <10 08/07/2019    Metabolic Disorder Labs: Lab Results  Component Value Date   HGBA1C 5.9 (H) 12/18/2019   MPG 123 12/18/2019   MPG 123 06/09/2016   Lab Results  Component Value Date   PROLACTIN 27.3 (H) 12/18/2019   PROLACTIN 16.5 06/09/2016   Lab Results  Component Value Date   CHOL 188 12/18/2019   TRIG 256 (H) 12/18/2019   HDL 38 (L) 12/18/2019   CHOLHDL 4.9 12/18/2019   VLDL 51 (H) 12/18/2019   LDLCALC 99 12/18/2019   LDLCALC  59 08/31/2017    Physical Findings: AIMS:  , ,  ,  ,    CIWA:    COWS:     Musculoskeletal: Strength & Muscle Tone: within normal limits Gait & Station: normal Patient leans: N/A  Psychiatric Specialty Exam: Physical Exam Vitals and nursing note reviewed.  Constitutional:      Appearance: She is well-developed.  Cardiovascular:     Rate and Rhythm: Normal rate.  Pulmonary:     Effort: Pulmonary effort is normal.  Neurological:     Mental Status: She is alert and oriented to person, place, and time.     Review of Systems  Constitutional: Negative.   Respiratory: Negative for cough and shortness of breath.   Psychiatric/Behavioral: Positive for agitation and hallucinations. Negative for behavioral problems, confusion, decreased concentration, dysphoric mood, self-injury, sleep disturbance and suicidal ideas. The patient is not nervous/anxious and is not hyperactive.     Blood pressure (!) 161/84, pulse 63, temperature 98.5 F (36.9 C), temperature source Oral, resp. rate 17, last menstrual period 05/10/2013, SpO2 98 %.There is no height or weight on file to calculate BMI.  General Appearance: Fairly Groomed  Eye Contact:  Poor  Speech:  Normal Rate  Volume:  Increased  Mood:  Irritable  Affect:  Congruent  Thought Process:  Coherent  Orientation:  Full (Time, Place, and Person)  Thought Content:  Logical  Suicidal Thoughts:  No  Homicidal Thoughts:  No  Memory:  UTA- patient poorly cooperative on assessment  Judgement:  Fair  Insight:  Lacking  Psychomotor Activity:  Normal  Concentration:  Concentration: Fair and Attention Span: Fair  Recall:  UTA-patient poorly cooperative  [employer] of Knowledge:  Poor  Language:  Fair  Akathisia:  No  Handed:  Right  AIMS (if indicated):     Assets:  Communication Skills Leisure Time Resilience  ADL's:  Intact  Cognition:  WNL  Sleep:  Number of Hours: 5     Treatment Plan Summary: Daily contact with patient to assess and  evaluate symptoms and progress in treatment and Medication management   Continue inpatient hospitalization. Patient continues to have elevated blood pressures. She has been taking medications yesterday but refused her weekly clonidine patch. Will reorder clonidine patch to be given today.  Increase Zyprexa to 10 mg PO daily, 20 mg PO QHS for psychosis Continue Coreg 25 mg PO BID for HTN Continue hydralazine 50 mg PO Q6HR for HTN Continue Imdur 30 mg PO daily for HTN Continue trazodone 50 mg PO QHS PRN insomnia Continue Haldol 5 mg PO/IM BID PRN agitation/Zyprexa refusal  Patient will participate in the therapeutic group milieu.  Discharge disposition in progress.   Aldean Baker, NP 12/20/2019, 4:52 PM

## 2019-12-20 NOTE — Progress Notes (Signed)
The patient presents preoccupied on approach. She is seen talking to herself in her room and the hallway. She appears to be responding to internal stimuli. She appears paranoid and makes unprovoked derogatory remarks to others. She has been heard cursing and name-calling. She appears disheveled with poor hygiene.   Orders reviewed. Verbal support and redirecting provided as needed for inappropriate behaviors. 15 minute checks performed for safety.   Patient compliant with taking medications today without encouragement. No medication side effects observed or reported by the patient.

## 2019-12-20 NOTE — Progress Notes (Signed)
Patient remains anxious and paranoid, she was hesitant to take medications from writer but she did comply with medication regime on shidr. She remains guarded on approach. She denies SI, HI & AVH. She appears to be in bed resting quietly at this time.

## 2019-12-21 DIAGNOSIS — F203 Undifferentiated schizophrenia: Secondary | ICD-10-CM | POA: Diagnosis not present

## 2019-12-21 NOTE — Progress Notes (Addendum)
   12/21/19 1256  Vital Signs  Pulse Rate (!) 56  BP (!) 129/59   D: Patient  Denies SI/HI/AVH. Pt. Denies anxiety and depression. Pt. Stated "you just gave me medicine" Pt. Was confused at times. Pt.isolated in room. A:  Patient took  Some scheduled medicine, but refused catapres in the afternoon.  Support and encouragement provided Routine safety checks conducted every 15 minutes. Patient  Informed to notify staff with any concerns.    R: Safety maintained.

## 2019-12-21 NOTE — Progress Notes (Signed)
[facility] MD Progress Note  12/21/2019 3:58 PM Erin Riley  MRN:  [id number] Subjective:  Patient is a 69 year old female with a reported past psychiatric history significant for schizophrenia who originally presented to the [facility] emergency department on 12/15/2019 secondary for medical clearance. The patient is not a good historian, but the patient presented with hallucinations. She also was paranoid. She apparently was noncompliant with her psychiatric medications. Per her son's report she has history of dementia.  Erin Riley found lying in bed. Per nursing report she has been isolating to her room and appears paranoid. She does not appear to be responding to internal stimuli on assessment, but remains guarded and poorly cooperative on assessment. She is unwilling to answer questions about her living arrangements. Patient unwilling to cooperate with mini mental status exam. Denies SI/HI/AVH. She has refused weekly clonidine patch but started on clonidine PO yesterday and has been compliant with PO clonidine and other antihypertensives.   Patient's son Erin Riley called with Dr. Viviano Simas. Erin Riley reports the patient stays with family members at times but has been homeless recently and not taking her medications. Erin Riley asked what family can do assist patient with medications and housing, and advised that family can petition court for guardianship due to patient's poor decision making and self-care with refusal of assistance. Erin Riley states the patient can go to his address at times of discharge- [address]   Principal Problem: Schizophrenia (HCC) Diagnosis: Principal Problem:   Schizophrenia (HCC) Active Problems:   Essential hypertension   Low back pain radiating to both legs   Obesity (BMI 30-39.9)   PAD (peripheral artery disease) (HCC)   Left ventricular diastolic dysfunction, NYHA class 1   Hyperlipidemia with target LDL less than 100  Total Time spent with  patient: 20 minutes  Past Psychiatric History: See admission H&P  Past Medical History:  Past Medical History:  Diagnosis Date  . Chronic back pain   . Chronic kidney disease (CKD), stage II (mild)    Class I-II  . Coronary artery disease 04/2009   50% stenosis in the perforator of LAD; catheterization was for an abnormal Myoview in January 2000 showing anterior and inferolateral ischemia.  . Diverticulitis   . 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 > 3 subsequent Echos show improved/normal EF with moderate to severe concentric LVH and diastolic dysfunction with LVOT/intracavitary gradient --> 06/2016: Severe LVH.  Vigorous EF, 65-70%.?? Gr 1 DD. Mild AS.  [name] Hyperlipidemia   . Hypertension   . Hypertensive hypertrophic cardiomyopathy: NYHA class II:  Echo: Severe concentric LVH with LV OT gradient; essentially preserved EF with diastolic dysfunction 02/15/2013   Echo 06/2016: Severe Concentric LVH. Vigorous EF 65-70%. ~ Gr I DD.   . Mild aortic stenosis by prior echocardiography    Echo 06/2016: Mild AS (Mean Gradient 15 mmHg); has had prior Mod-Severe MR (not seen on current echo)  . PAD (peripheral artery disease) [facility]) March 2013   Lower extremity Dopplers: R. SFA 50-60%, R. PTA proximally occluded with distal reconstitution;; L. common iliac ~50%, L. SFA 50-70% stenosis, L. PTA < 50%  . Schizophrenia [facility])     Past Surgical History:  Procedure Laterality Date  . BUNIONECTOMY    . carotid doppler  05/29/2011   left bulb/prox ICA moderate amtfibrous plaque with no evidence significant reduction.,right bulb /proximal ICA normal patency  . lower  extremity doppler  05/29/2011   right SFA 50% to 59% diameter reduction,right posterior tibal atreery occlusive disease,reconstituting distally, left common illiac<50%,left SFA 50 to70%,left post. tibial <50%  . NM MYOCAR PERF WALL MOTION  03/2009    Persantine; EF 51%-both anterior and inferolateral ischemia  . TRANSTHORACIC ECHOCARDIOGRAM  06/2016   Severe LVH.  Vigorous EF of 65-70%.  No RWMA. ~Only grade 1 diastolic dysfunction.  Mild aortic stenosis (mean gradient 15 mmHg)  . TRANSTHORACIC ECHOCARDIOGRAM  07/2012   EF 50-55%; severe concentric LVH; only grade 1 diastolic dysfunction. Mild aortic sclerosis - with LVOT /intracavitary gradient of roughly 20 mmHg mean. Mild to moderately dilated LA;; previously reported MR not seen   Family History:  Family History  Problem Relation Age of Onset  . Hypertension Mother   . Breast cancer Neg Hx    Family Psychiatric  History: See admission H&P Social History:  Social History   Substance and Sexual Activity  Alcohol Use No     Social History   Substance and Sexual Activity  Drug Use No    Social History   Socioeconomic History  . Marital status: Single    Spouse name: Not on file  . Number of children: 7  . Years of education: Not on file  . Highest education level: Not on file  Occupational History  . Not on file  Tobacco Use  . Smoking status: Former Smoker    Types: Cigarettes    Quit date: 05/14/2002    Years since quitting: 17.6  . Smokeless tobacco: Never Used  Vaping Use  . Vaping Use: Never used  Substance and Sexual Activity  . Alcohol use: No  . Drug use: No  . Sexual activity: Yes    Birth control/protection: Post-menopausal  Other Topics Concern  . Not on file  Social History Narrative   Now single mother of 2 with one grandchild. She quit smoking roughly 5 years ago and is not so since. She has also stopped drinking alcohol. She does try get routine exercise walking at least a mile 3-4 days a week.    She lives with her 61 year old mother. 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   Social Determinants of Health   Financial Resource Strain:   . Difficulty of Paying Living Expenses: Not on file  Food Insecurity:   . Worried About Programme researcher, broadcasting/film/video in  the Last Year: Not on file  . Ran Out of Food in the Last Year: Not on file  Transportation Needs:   . Lack of Transportation (Medical): Not on file  . Lack of Transportation (Non-Medical): Not on file  Physical Activity:   . Days of Exercise per Week: Not on file  . Minutes of Exercise per Session: Not on file  Stress:   . Feeling of Stress : Not on file  Social Connections:   . Frequency of Communication with Friends and Family: Not on file  . Frequency of Social Gatherings with Friends and Family: Not on file  . Attends Religious Services: Not on file  . Active Member of Clubs or Organizations: Not on file  . Attends Banker Meetings: Not on file  . Marital Status: Not on file   Additional Social History:                         Sleep: Good  Appetite:  Good  Current Medications: Current Facility-Administered Medications  Medication Dose Route Frequency Provider Last  Rate Last Admin  . acetaminophen (TYLENOL) tablet 650 mg  650 mg Oral Q6H PRN Leevy-Johnson, Brooke A, NP      . alum & mag hydroxide-simeth (MAALOX/MYLANTA) 200-200-20 MG/5ML suspension 30 mL  30 mL Oral Q4H PRN Leevy-Johnson, Brooke A, NP      . carvedilol (COREG) tablet 25 mg  25 mg Oral BID WC Antonieta Pert, MD   25 mg at 12/21/19 0949  . cloNIDine (CATAPRES - Dosed in mg/24 hr) patch 0.1 mg  0.1 mg Transdermal Weekly Aldean Baker, NP      . cloNIDine (CATAPRES) tablet 0.1 mg  0.1 mg Oral TID Mariel Craft, MD   0.1 mg at 12/21/19 0949  . haloperidol (HALDOL) tablet 5 mg  5 mg Oral BID PRN Aldean Baker, NP       Or  . haloperidol lactate (HALDOL) injection 5 mg  5 mg Intramuscular BID PRN Aldean Baker, NP      . hydrALAZINE (APRESOLINE) tablet 50 mg  50 mg Oral Q6H Antonieta Pert, MD   50 mg at 12/21/19 1302  . isosorbide mononitrate (IMDUR) 24 hr tablet 30 mg  30 mg Oral Daily Antonieta Pert, MD   30 mg at 12/21/19 0950  . magnesium hydroxide (MILK OF MAGNESIA)  suspension 30 mL  30 mL Oral Daily PRN Leevy-Johnson, Brooke A, NP      . OLANZapine zydis (ZYPREXA) disintegrating tablet 10 mg  10 mg Oral Daily Aldean Baker, NP   10 mg at 12/21/19 0951  . OLANZapine zydis (ZYPREXA) disintegrating tablet 20 mg  20 mg Oral QHS Aldean Baker, NP   20 mg at 12/20/19 2143  . pneumococcal 23 valent vaccine (PNEUMOVAX-23) injection 0.5 mL  0.5 mL Intramuscular Tomorrow-1000 Lorre Nick, MD      . traZODone (DESYREL) tablet 50 mg  50 mg Oral QHS PRN Leevy-Johnson, Brooke A, NP   50 mg at 12/19/19 2042    Lab Results: No results found for this or any previous visit (from the past 48 hour(s)).  Blood Alcohol level:  Lab Results  Component Value Date   ETH <10 12/15/2019   ETH <10 08/07/2019    Metabolic Disorder Labs: Lab Results  Component Value Date   HGBA1C 5.9 (H) 12/18/2019   MPG 123 12/18/2019   MPG 123 06/09/2016   Lab Results  Component Value Date   PROLACTIN 27.3 (H) 12/18/2019   PROLACTIN 16.5 06/09/2016   Lab Results  Component Value Date   CHOL 188 12/18/2019   TRIG 256 (H) 12/18/2019   HDL 38 (L) 12/18/2019   CHOLHDL 4.9 12/18/2019   VLDL 51 (H) 12/18/2019   LDLCALC 99 12/18/2019   LDLCALC 59 08/31/2017    Physical Findings: AIMS:  , ,  ,  ,    CIWA:    COWS:     Musculoskeletal: Strength & Muscle Tone: within normal limits Gait & Station: normal Patient leans: N/A  Psychiatric Specialty Exam: Physical Exam Vitals and nursing note reviewed.  Constitutional:      Appearance: She is well-developed.  Cardiovascular:     Rate and Rhythm: Normal rate.  Pulmonary:     Effort: Pulmonary effort is normal.  Neurological:     Mental Status: She is alert and oriented to person, place, and time.     Review of Systems  Constitutional: Negative.   Respiratory: Negative for cough and shortness of breath.   Psychiatric/Behavioral: Positive for confusion and  decreased concentration. Negative for agitation, behavioral  problems, dysphoric mood, hallucinations, self-injury, sleep disturbance and suicidal ideas. The patient is not nervous/anxious and is not hyperactive.     Blood pressure (!) 129/59, pulse (!) 56, temperature 98.5 F (36.9 C), temperature source Oral, resp. rate 18, last menstrual period 05/10/2013, SpO2 98 %.There is no height or weight on file to calculate BMI.  General Appearance: Disheveled  Eye Contact:  Fair  Speech:  Normal Rate  Volume:  Increased  Mood:  Irritable  Affect:  Congruent  Thought Process:  Coherent  Orientation:  Full (Time, Place, and Person)  Thought Content:  Paranoid Ideation  Suicidal Thoughts:  No  Homicidal Thoughts:  No  Memory:  UTA- patient poorly cooperative. Nursing reports apparent memory issues  Judgement:  Poor  Insight:  Lacking  Psychomotor Activity:  Normal  Concentration:  Concentration: Poor and Attention Span: Poor  Recall:  UTA- patient poorly cooperative  Fund of Knowledge:  Poor  Language:  Poor  Akathisia:  No  Handed:  Right  AIMS (if indicated):     Assets:  Communication Skills Leisure Time Social Support  ADL's:  Intact  Cognition:  WNL  Sleep:  Number of Hours: 5     Treatment Plan Summary: Daily contact with patient to assess and evaluate symptoms and progress in treatment and Medication management   Continue inpatient hospitalization.  Continue Zyprexa 10 mg PO daily, 20 mg PO QHS for psychosis Continue clonidine 0.1 mg PO TID for HTN Continue Coreg 25 mg PO BID for HTN Continue hydralazine 50 mg PO Q6HR for HTN Continue Imdur 30 mg PO daily for HTN Continue trazodone 50 mg PO QHS PRN insomnia Continue Haldol 5 mg PO/IM BID PRN agitation/Zyprexa refusal  Patient will participate in the therapeutic group milieu.  Discharge disposition in progress.   Aldean BakerJanet E Elgie Maziarz, NP 12/21/2019, 3:58 PM

## 2019-12-21 NOTE — Progress Notes (Signed)
   12/21/19 0430  Psych Admission Type (Psych Patients Only)  Admission Status Involuntary  Psychosocial Assessment  Patient Complaints Irritability  Eye Contact Avoids  Facial Expression Anxious  Affect Preoccupied;Inconsistent with thought content  Speech Loud;Pressured  Interaction Intrusive  Motor Activity Slow  Appearance/Hygiene Disheveled  Behavior Characteristics Anxious  Mood Depressed;Anxious  Aggressive Behavior  Effect No apparent injury  Thought Process  Coherency Disorganized  Content Paranoia;Preoccupation  Delusions None reported or observed  Perception Derealization  Hallucination None reported or observed  Judgment Poor  Confusion Mild  Danger to Self  Current suicidal ideation? Denies  Danger to Others  Danger to Others None reported or observed

## 2019-12-21 NOTE — [facility] Group Notes (Signed)
[facility] Group Notes: (Clinical Social Work)   12/21/2019      Type of Therapy:  Group Therapy   Participation Level:  Did Not Attend despite MHT prompting   Shellia Cleverly, LCSW  12/21/2019 1:07 PM

## 2019-12-22 DIAGNOSIS — F203 Undifferentiated schizophrenia: Secondary | ICD-10-CM | POA: Diagnosis not present

## 2019-12-22 MED ORDER — HYDRALAZINE HCL 50 MG PO TABS
50.0000 mg | ORAL_TABLET | Freq: Four times a day (QID) | ORAL | 0 refills | Status: DC
Start: 2019-12-22 — End: 2020-05-20

## 2019-12-22 MED ORDER — CLONIDINE HCL 0.1 MG PO TABS
0.1000 mg | ORAL_TABLET | Freq: Three times a day (TID) | ORAL | 0 refills | Status: DC
Start: 2019-12-22 — End: 2020-05-20

## 2019-12-22 MED ORDER — OLANZAPINE 10 MG PO TBDP
10.0000 mg | ORAL_TABLET | Freq: Every morning | ORAL | 0 refills | Status: DC
Start: 2019-12-22 — End: 2020-05-20

## 2019-12-22 MED ORDER — CARVEDILOL 25 MG PO TABS: 25.0000 mg | ORAL_TABLET | Freq: Two times a day (BID) | ORAL | 0 refills | Status: DC

## 2019-12-22 MED ORDER — OLANZAPINE 20 MG PO TBDP
20.0000 mg | ORAL_TABLET | Freq: Every day | ORAL | 0 refills | Status: DC
Start: 2019-12-22 — End: 2020-05-20

## 2019-12-22 MED ORDER — ISOSORBIDE MONONITRATE ER 30 MG PO TB24: 30.0000 mg | ORAL_TABLET | Freq: Every day | ORAL | 0 refills | Status: DC

## 2019-12-22 NOTE — Discharge Summary (Signed)
Physician Discharge Summary Note  Patient:  Erin Riley is an 69 y.o., female MRN:  [id number] DOB:  1950-12-13 Patient phone:  [phone] (home)  Patient address:   [address] [place] [id number],  Total Time spent with patient: 15 minutes  Date of Admission:  12/16/2019 Date of Discharge: 12/22/19  Reason for Admission:  psychosis  Principal Problem: Schizophrenia [facility](HCC) Discharge Diagnoses: Principal Problem:   Schizophrenia (HCC) Active Problems:   Essential hypertension   Low back pain radiating to both legs   Obesity (BMI 30-39.9)   PAD (peripheral artery disease) (HCC)   Left ventricular diastolic dysfunction, NYHA class 1   Hyperlipidemia with target LDL less than 100   Past Psychiatric History: She carries a diagnosis of schizophrenia.  Her last admission to our system was on 01/31/2019.  She was admitted at [facility].  She has several other psychiatric hospitalizations.  She has been stabilized on Zyprexa in the past.  Past Medical History:  Past Medical History:  Diagnosis Date  . Chronic back pain   . Chronic kidney disease (CKD), stage II (mild)    Class I-II  . Coronary artery disease 04/2009   50% stenosis in the perforator of LAD; catheterization was for an abnormal Myoview in January 2000 showing anterior and inferolateral ischemia.  . Diverticulitis   . 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 > 3 subsequent Echos show improved/normal EF with moderate to severe concentric LVH and diastolic dysfunction with LVOT/intracavitary gradient --> 06/2016: Severe LVH.  Vigorous EF, 65-70%.?? Gr 1 DD. Mild AS.  [name]. Hyperlipidemia   . Hypertension   . Hypertensive hypertrophic cardiomyopathy: NYHA class II:  Echo: Severe concentric LVH with LV OT gradient; essentially preserved EF with diastolic dysfunction 02/15/2013   Echo 06/2016:  Severe Concentric LVH. Vigorous EF 65-70%. ~ Gr I DD.   . Mild aortic stenosis by prior echocardiography    Echo 06/2016: Mild AS (Mean Gradient 15 mmHg); has had prior Mod-Severe MR (not seen on current echo)  . PAD (peripheral artery disease) [facility](HCC) March 2013   Lower extremity Dopplers: R. SFA 50-60%, R. PTA proximally occluded with distal reconstitution;; L. common iliac ~50%, L. SFA 50-70% stenosis, L. PTA < 50%  . Schizophrenia [facility](HCC)     Past Surgical History:  Procedure Laterality Date  . BUNIONECTOMY    . carotid doppler  05/29/2011   left bulb/prox ICA moderate amtfibrous plaque with no evidence significant reduction.,right bulb /proximal ICA normal patency  . lower extremity doppler  05/29/2011   right SFA 50% to 59% diameter reduction,right posterior tibal atreery occlusive disease,reconstituting distally, left common illiac<50%,left SFA 50 to70%,left post. tibial <50%  . NM MYOCAR PERF WALL MOTION  03/2009   Persantine; EF 51%-both anterior and inferolateral ischemia  . TRANSTHORACIC ECHOCARDIOGRAM  06/2016   Severe LVH.  Vigorous EF of 65-70%.  No RWMA. ~Only grade 1 diastolic dysfunction.  Mild aortic stenosis (mean gradient 15 mmHg)  . TRANSTHORACIC ECHOCARDIOGRAM  07/2012   EF 50-55%; severe concentric LVH; only grade 1 diastolic dysfunction. Mild aortic sclerosis - with LVOT /intracavitary gradient of roughly 20 mmHg mean. Mild to moderately dilated LA;; previously reported MR not seen   Family History:  Family History  Problem Relation Age of Onset  . Hypertension Mother   . Breast cancer Neg Hx    Family Psychiatric  History: Denies Social History:  Social History   Substance and Sexual Activity  Alcohol Use No     Social History   Substance and Sexual Activity  Drug Use No    Social History   Socioeconomic History  . Marital status: Single    Spouse name: Not on file  . Number of children: 7  . Years of education: Not on file  . Highest education level:  Not on file  Occupational History  . Not on file  Tobacco Use  . Smoking status: Former Smoker    Types: Cigarettes    Quit date: 05/14/2002    Years since quitting: 17.6  . Smokeless tobacco: Never Used  Vaping Use  . Vaping Use: Never used  Substance and Sexual Activity  . Alcohol use: No  . Drug use: No  . Sexual activity: Yes    Birth control/protection: Post-menopausal  Other Topics Concern  . Not on file  Social History Narrative   Now single mother of 2 with one grandchild. She quit smoking roughly 5 years ago and is not so since. She has also stopped drinking alcohol. She does try get routine exercise walking at least a mile 3-4 days a week.    She lives with her 27 year old mother. 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   Social Determinants of Health   Financial Resource Strain:   . Difficulty of Paying Living Expenses: Not on file  Food Insecurity:   . Worried About Programme researcher, broadcasting/film/video in the Last Year: Not on file  . Ran Out of Food in the Last Year: Not on file  Transportation Needs:   . Lack of Transportation (Medical): Not on file  . Lack of Transportation (Non-Medical): Not on file  Physical Activity:   . Days of Exercise per Week: Not on file  . Minutes of Exercise per Session: Not on file  Stress:   . Feeling of Stress : Not on file  Social Connections:   . Frequency of Communication with Friends and Family: Not on file  . Frequency of Social Gatherings with Friends and Family: Not on file  . Attends Religious Services: Not on file  . Active Member of Clubs or Organizations: Not on file  . Attends Banker Meetings: Not on file  . Marital Status: Not on file    Hospital Course:  From admission H&P: Patient is a 69 year old female with a reported past psychiatric history significant for schizophrenia who originally presented to the [facility] emergency department on 12/15/2019 requesting medical clearance. The patient is not a  good historian. According to the notes from the emergency department the patient presented with hallucinations. On examination this morning she denied having hallucinations, but admitted "I do talk to [facility]". She also showed evidence of paranoia. She was mumbling and stating that "most people are watching me and they are going to cut me". When asked about her medications she endorsed taking blood pressure medicines but denied any other medications. She was evaluated by the comprehensive clinical assessment team on 12/16/2019. She again was not a very good historian. It was recommended that she be admitted to the psychiatric facility. Review of the electronic medical record from our facility revealed multiple emergency room visits during 2021. Most of these were for somatic complaints. Her last psychiatric hospitalization within our system was in November 2020.She was admitted to the [facility]. She had also previously been admitted to [facility] in July of that year as well. Her discharge  medications in 2020 included carvedilol, Pepcid, hydralazine, isosorbide and olanzapine. She was receiving olanzapine 10 mg p.o. twice daily. A previous admission to what appears to be the medical hospital on 01/2018 and included olanzapine 20 mg but directions were not clear. She was admitted to the hospital for evaluation and stabilization.  Erin Riley was admitted for psychosis after presenting to the emergency room for medical concerns. She remained on the [facility] unit for 6 days. She was restarted on Zyprexa. Patient was intermittently noncompliant with medications during hospitalization and had notably elevated blood pressures, especially when noncompliant with antihypertensives. She was sent to the ED for medical evaluation, and clonidine was added to previous antihypertensives (see Riley). Patient was a poor historian, irritable, and poorly cooperative with staff during hospitalization.  Her son reported patient had dementia as well as history of  Schizophrenia. Multiple attempts were made to engage patient for mini mental status exam, but patient was not cooperative. She has shown some improvement in mood, affect, sleep, and interaction. Per chart review from prior hospitalizations, patient appears to be at her baseline mental status with chronic psychosis. She denies any SI/HI/AVH and contracts for safety.She shows no evidence of acute risk of harm to self or others and does not meet IVC criteria. Per her son Gordan Payment [phone]), patient may discharge to his residence. I reiterated with son that patient does not meet IVC criteria but that family may petition for guardianship due to patient's poor judgment and ineffective self-care. Mr. Loleta Chance states family does intent to pursue guardianship. Erin Riley. She is referred to [facility] for follow-up (see Riley). Patient is provided with prescriptions for medications upon discharge. She is discharging to her son's home via [employer].  Physical Findings: AIMS:  , ,  ,  ,    CIWA:    COWS:     Musculoskeletal: Strength & Muscle Tone: within normal limits Gait & Station: normal Patient leans: N/A  Psychiatric Specialty Exam: Physical Exam Vitals and nursing note reviewed.  Constitutional:      Appearance: She is well-developed.  Cardiovascular:     Rate and Rhythm: Normal rate.  Pulmonary:     Effort: Pulmonary effort is normal.  Neurological:     Mental Status: She is alert and oriented to person, place, and time.     Review of Systems  Constitutional: Negative.   Respiratory: Negative for cough and shortness of breath.   Psychiatric/Behavioral: Positive for hallucinations (chronic). Negative for agitation, behavioral problems, confusion, decreased concentration, dysphoric mood, self-injury, sleep disturbance and suicidal ideas. The patient is not  nervous/anxious and is not hyperactive.     Blood pressure (!) 147/79, pulse 68, temperature 98.5 F (36.9 C), temperature source Oral, resp. rate 18, last menstrual period 05/10/2013, SpO2 98 %.There is no height or weight on file to calculate BMI.  See MD's discharge SRA     Have you used any form of tobacco in the last 30 days? (Cigarettes, Smokeless Tobacco, Cigars, and/or Pipes): No  Has this patient used any form of tobacco in the last 30 days? (Cigarettes, Smokeless Tobacco, Cigars, and/or Pipes)  No  Blood Alcohol level:  Lab Results  Component Value Date   ETH <10 12/15/2019   ETH <10 08/07/2019    Metabolic Disorder Labs:  Lab Results  Component Value Date   HGBA1C 5.9 (H) 12/18/2019   MPG 123 12/18/2019   MPG 123 06/09/2016   Lab  Results  Component Value Date   PROLACTIN 27.3 (H) 12/18/2019   PROLACTIN 16.5 06/09/2016   Lab Results  Component Value Date   CHOL 188 12/18/2019   TRIG 256 (H) 12/18/2019   HDL 38 (L) 12/18/2019   CHOLHDL 4.9 12/18/2019   VLDL 51 (H) 12/18/2019   LDLCALC 99 12/18/2019   LDLCALC 59 08/31/2017    See Psychiatric Specialty Exam and Suicide Risk Assessment completed by Attending Physician prior to discharge.  Discharge destination:  Home  Is patient on multiple antipsychotic therapies at discharge:  No   Has Patient had three or more failed trials of antipsychotic monotherapy by history:  No  Recommended Plan for Multiple Antipsychotic Therapies: NA  Discharge Instructions    Discharge instructions   Complete by: As directed    Activity as tolerated. Diet as recommended by primary care physician. Keep all scheduled follow-up appointments as recommended.     Allergies as of 12/22/2019   No Known Allergies     Medication List    TAKE these medications     Indication  carvedilol 25 MG tablet Commonly known as: COREG Take 1 tablet (25 mg total) by mouth 2 (two) times daily with a meal.  Indication: Per PCP    cloNIDine 0.1 MG tablet Commonly known as: CATAPRES Take 1 tablet (0.1 mg total) by mouth 3 (three) times daily.  Indication: High Blood Pressure Disorder   hydrALAZINE 50 MG tablet Commonly known as: APRESOLINE Take 1 tablet (50 mg total) by mouth every 6 (six) hours.  Indication: High Blood Pressure Disorder   isosorbide mononitrate 30 MG 24 hr tablet Commonly known as: IMDUR Take 1 tablet (30 mg total) by mouth daily. Start taking on: December 23, 2019  Indication: Per PCP   OLANZapine zydis 10 MG disintegrating tablet Commonly known as: ZYPREXA Take 1 tablet (10 mg total) by mouth in the morning.  Indication: Schizophrenia   OLANZapine zydis 20 MG disintegrating tablet Commonly known as: ZYPREXA Take 1 tablet (20 mg total) by mouth at bedtime.  Indication: Schizophrenia       Follow-up Information    [place] Go to.   Specialty: Professional Counselor Why: Please go to this provider during their walk in hours for new patients:  Monday through Friday, from 8:30 am to 12:00 pm and 1:00 pm to 2:30 pm.   Contact information: [address] [place] [address] [phone]               Follow-up recommendations: Activity as tolerated. Diet as recommended by primary care physician. Keep all scheduled follow-up appointments as recommended.   Comments:   Patient is instructed to take all prescribed medications as recommended. Report any side effects or adverse reactions to your outpatient psychiatrist. Patient is instructed to abstain from alcohol and illegal drugs while on prescription medications. In the event of worsening symptoms, patient is instructed to call the crisis hotline, 911, or go to the nearest emergency department for evaluation and treatment.  Signed: Aldean Baker, NP 12/22/2019, 10:19 AM

## 2019-12-22 NOTE — Tx Team (Cosign Needed)
Interdisciplinary Treatment and Diagnostic Plan Update  12/22/2019 Time of Session: 9:00am  Erin Riley MRN: [id number]  Principal Diagnosis: Schizophrenia [facility])  Secondary Diagnoses: Principal Problem:   Schizophrenia (HCC) Active Problems:   Essential hypertension   Low back pain radiating to both legs   Obesity (BMI 30-39.9)   PAD (peripheral artery disease) (HCC)   Left ventricular diastolic dysfunction, NYHA class 1   Hyperlipidemia with target LDL less than 100   Current Medications:  Current Facility-Administered Medications  Medication Dose Route Frequency Provider Last Rate Last Admin  . acetaminophen (TYLENOL) tablet 650 mg  650 mg Oral Q6H PRN Leevy-Johnson, Brooke A, NP      . alum & mag hydroxide-simeth (MAALOX/MYLANTA) 200-200-20 MG/5ML suspension 30 mL  30 mL Oral Q4H PRN Leevy-Johnson, Brooke A, NP      . carvedilol (COREG) tablet 25 mg  25 mg Oral BID WC Antonieta Pert, MD   25 mg at 12/21/19 0949  . cloNIDine (CATAPRES - Dosed in mg/24 hr) patch 0.1 mg  0.1 mg Transdermal Weekly Aldean Baker, NP      . cloNIDine (CATAPRES) tablet 0.1 mg  0.1 mg Oral TID Mariel Craft, MD   0.1 mg at 12/21/19 2149  . haloperidol (HALDOL) tablet 5 mg  5 mg Oral BID PRN Aldean Baker, NP       Or  . haloperidol lactate (HALDOL) injection 5 mg  5 mg Intramuscular BID PRN Aldean Baker, NP      . hydrALAZINE (APRESOLINE) tablet 50 mg  50 mg Oral Q6H Antonieta Pert, MD   50 mg at 12/21/19 2150  . isosorbide mononitrate (IMDUR) 24 hr tablet 30 mg  30 mg Oral Daily Antonieta Pert, MD   30 mg at 12/21/19 0950  . magnesium hydroxide (MILK OF MAGNESIA) suspension 30 mL  30 mL Oral Daily PRN Leevy-Johnson, Brooke A, NP      . OLANZapine zydis (ZYPREXA) disintegrating tablet 10 mg  10 mg Oral Daily Aldean Baker, NP   10 mg at 12/21/19 0951  . OLANZapine zydis (ZYPREXA) disintegrating tablet 20 mg  20 mg Oral QHS Aldean Baker, NP   20 mg at 12/21/19 2149  . pneumococcal  23 valent vaccine (PNEUMOVAX-23) injection 0.5 mL  0.5 mL Intramuscular Tomorrow-1000 Lorre Nick, MD      . traZODone (DESYREL) tablet 50 mg  50 mg Oral QHS PRN Leevy-Johnson, Brooke A, NP   50 mg at 12/19/19 2042   PTA Medications: Medications Prior to Admission  Medication Sig Dispense Refill Last Dose  . carvedilol (COREG) 25 MG tablet Take 1 tablet (25 mg total) by mouth 2 (two) times daily with a meal. 60 tablet 1   . isosorbide mononitrate (IMDUR) 30 MG 24 hr tablet Take 1 tablet (30 mg total) by mouth daily. 30 tablet 1     Patient Stressors: Other: being here at [facility]  Patient Strengths: Supportive family/friends  Treatment Modalities: Medication Management, Group therapy, Case management,  1 to 1 session with clinician, Psychoeducation, Recreational therapy.   Physician Treatment Plan for Primary Diagnosis: Schizophrenia (HCC) Long Term Goal(s): Improvement in symptoms so as ready for discharge Improvement in symptoms so as ready for discharge   Short Term Goals: Ability to identify changes in lifestyle to reduce recurrence of condition will improve Ability to verbalize feelings will improve Ability to demonstrate self-control will improve Ability to identify and develop effective coping behaviors will improve Ability to maintain  clinical measurements within normal limits will improve Compliance with prescribed medications will improve Ability to identify changes in lifestyle to reduce recurrence of condition will improve Ability to verbalize feelings will improve Ability to demonstrate self-control will improve Ability to identify and develop effective coping behaviors will improve Ability to maintain clinical measurements within normal limits will improve Compliance with prescribed medications will improve  Medication Management: Evaluate patient's response, side effects, and tolerance of medication regimen.  Therapeutic Interventions: 1 to 1 sessions, Unit Group  sessions and Medication administration.  Evaluation of Outcomes: Progressing  Physician Treatment Plan for Secondary Diagnosis: Principal Problem:   Schizophrenia (HCC) Active Problems:   Essential hypertension   Low back pain radiating to both legs   Obesity (BMI 30-39.9)   PAD (peripheral artery disease) (HCC)   Left ventricular diastolic dysfunction, NYHA class 1   Hyperlipidemia with target LDL less than 100  Long Term Goal(s): Improvement in symptoms so as ready for discharge Improvement in symptoms so as ready for discharge   Short Term Goals: Ability to identify changes in lifestyle to reduce recurrence of condition will improve Ability to verbalize feelings will improve Ability to demonstrate self-control will improve Ability to identify and develop effective coping behaviors will improve Ability to maintain clinical measurements within normal limits will improve Compliance with prescribed medications will improve Ability to identify changes in lifestyle to reduce recurrence of condition will improve Ability to verbalize feelings will improve Ability to demonstrate self-control will improve Ability to identify and develop effective coping behaviors will improve Ability to maintain clinical measurements within normal limits will improve Compliance with prescribed medications will improve     Medication Management: Evaluate patient's response, side effects, and tolerance of medication regimen.  Therapeutic Interventions: 1 to 1 sessions, Unit Group sessions and Medication administration.  Evaluation of Outcomes: Progressing   RN Treatment Plan for Primary Diagnosis: Schizophrenia (HCC) Long Term Goal(s): Knowledge of disease and therapeutic regimen to maintain health will improve  Short Term Goals: Ability to remain free from injury will improve, Ability to verbalize frustration and anger appropriately will improve, Ability to participate in decision making will improve,  Ability to verbalize feelings will improve, Ability to identify and develop effective coping behaviors will improve and Compliance with prescribed medications will improve  Medication Management: RN will administer medications as ordered by provider, will assess and evaluate patient's response and provide education to patient for prescribed medication. RN will report any adverse and/or side effects to prescribing provider.  Therapeutic Interventions: 1 on 1 counseling sessions, Psychoeducation, Medication administration, Evaluate responses to treatment, Monitor vital signs and CBGs as ordered, Perform/monitor CIWA, COWS, AIMS and Fall Risk screenings as ordered, Perform wound care treatments as ordered.  Evaluation of Outcomes: Progressing   LCSW Treatment Plan for Primary Diagnosis: Schizophrenia (HCC) Long Term Goal(s): Safe transition to appropriate next level of care at discharge, Engage patient in therapeutic group addressing interpersonal concerns.  Short Term Goals: Engage patient in aftercare planning with referrals and resources, Increase social support, Increase emotional regulation, Facilitate acceptance of mental health diagnosis and concerns, Identify triggers associated with mental health/substance abuse issues and Increase skills for wellness and recovery  Therapeutic Interventions: Assess for all discharge needs, 1 to 1 time with Social worker, Explore available resources and support systems, Assess for adequacy in community support network, Educate family and significant other(s) on suicide prevention, Complete Psychosocial Assessment, Interpersonal group therapy.  Evaluation of Outcomes: Progressing   Progress in Treatment: Attending groups: No. Participating  in groups: No. Taking medication as prescribed: No. Toleration medication: No. Family/Significant other contact made: No, will contact:  If consents are given  Patient understands diagnosis: No. Discussing patient  identified problems/goals with staff: Yes. Medical problems stabilized or resolved: Yes. Denies suicidal/homicidal ideation: Yes. Issues/concerns per patient self-inventory: No.   New problem(s) identified: No, Describe:  None  New Short Term/Long Term Goal(s): medication stabilization, elimination of SI thoughts, development of comprehensive mental wellness plan.   Patient Goals: Did not attend   Discharge Plan or Barriers: Patient recently admitted. CSW will continue to follow and assess for appropriate referrals and possible discharge planning.   Reason for Continuation of Hospitalization: Hallucinations Medication stabilization  Estimated Length of Stay: 3 to 5 days   Attendees: Patient: Did not attend  12/22/2019   Physician:  12/22/2019   Nursing:  12/22/2019   RN Care Manager:  12/22/2019   Social Worker: Jacinta Shoe, LCSW 12/22/2019   Recreational Therapist:  12/22/2019   Other:  12/22/2019  Other:  12/22/2019   Other: 12/22/2019     Scribe for Treatment Team: Jacinta Shoe, LCSW 12/22/2019 9:11 AM

## 2019-12-22 NOTE — B[facility] Suicide Risk Assessment (Signed)
[facility] Discharge Suicide Risk Assessment   Principal Problem: Schizophrenia [facility]) Discharge Diagnoses: Principal Problem:   Schizophrenia (HCC) Active Problems:   Essential hypertension   Low back pain radiating to both legs   Obesity (BMI 30-39.9)   PAD (peripheral artery disease) (HCC)   Left ventricular diastolic dysfunction, NYHA class 1   Hyperlipidemia with target LDL less than 100  Patient is a 69 year old female transferred from [place] long ED for stabilization and treatment of psychosis.  Patient has a past psychiatric history is significant for schizophrenia.  Patient seen this morning, reports not hearing voices, adds that she is taking her medications, reports that she wants to go home as she feels she is at her baseline.  Patient does have some delusions, somatic in nature, per report from son, that is patient's baseline.  Patient states that she is ready to go home, plans to take her medications as prescribed, will be staying with her son.  Patient denies any suicidal ideation, homicidal ideation, any hallucinations or paranoia Total Time spent with patient: 30 minutes  Musculoskeletal: Strength & Muscle Tone: within normal limits Gait & Station: normal Patient leans: N/A  Psychiatric Specialty Exam: Review of Systems  Constitutional: Negative.  Negative for activity change and fatigue.  HENT: Negative.  Negative for facial swelling and sore throat.   Eyes: Negative.  Negative for discharge and redness.  Respiratory: Negative.  Negative for shortness of breath and wheezing.   Cardiovascular: Negative.  Negative for palpitations.  Gastrointestinal: Negative.   Musculoskeletal: Negative.  Negative for myalgias.  Allergic/Immunologic: Negative.  Negative for food allergies.  Neurological: Negative.  Negative for dizziness, seizures, syncope, speech difficulty and light-headedness.  Psychiatric/Behavioral: Negative.  Negative for agitation, behavioral problems, decreased  concentration and hallucinations. The patient is not nervous/anxious.     Blood pressure (!) 147/79, pulse 68, temperature 98.5 F (36.9 C), temperature source Oral, resp. rate 18, last menstrual period 05/10/2013, SpO2 98 %.There is no height or weight on file to calculate BMI.  General Appearance: Casual  Eye Contact::  Fair  Speech:  Clear and Coherent and Normal Rate409  Volume:  Normal  Mood:  Irritable  Affect:  Congruent and Full Range  Thought Process:  Coherent, Goal Directed and Descriptions of Associations: Intact  Orientation:  Full (Time, Place, and Person)  Thought Content:  Logical and Rumination  Suicidal Thoughts:  No  Homicidal Thoughts:  No  Memory:  Immediate;   Fair Recent;   Fair Remote;   Fair  Judgement:  Impaired  Insight:  Shallow  Psychomotor Activity:  Mannerisms  Concentration:  Fair  Recall:  [employer] of Knowledge:Fair  Language: Fair  Akathisia:  No  Handed:  Right  AIMS (if indicated):     Assets:  Desire for Improvement Housing Social Support  Sleep:  Number of Hours: 5  Cognition: WNL  ADL's:  Intact   Mental Status Per Nursing Assessment::   On Admission:  NA  Demographic Factors:  Age 4 or older and Low socioeconomic status  Loss Factors: Decline in physical health and Financial problems/change in socioeconomic status  Historical Factors: NA  Risk Reduction Factors:   Living with another person, especially a relative  Continued Clinical Symptoms:  More than one psychiatric diagnosis Unstable or Poor Therapeutic Relationship Previous Psychiatric Diagnoses and Treatments  Cognitive Features That Contribute To Risk:  None    Suicide Risk:  Minimal: No identifiable suicidal ideation.  Patients presenting with no risk factors but with morbid  ruminations; may be classified as minimal risk based on the severity of the depressive symptoms   Follow-up Information    [place] Go to.   Specialty:  Professional Counselor Why: Please go to this provider during their walk in hours for new patients:  Monday through Friday, from 8:30 am to 12:00 pm and 1:00 pm to 2:30 pm.   Contact information: [address] [place] [address] [phone]               Plan Of Care/Follow-up recommendations:  Activity:  as tolerated Diet:  heart healthy diet Other:  keep follow up appointments and take medications as prescribed  Patient son to look into getting guardianship on patient as patient does have some symptoms of dementia, long history of medication noncompliance.  Nelly Rout, MD 12/22/2019, 11:57 AM

## 2019-12-22 NOTE — Progress Notes (Signed)
  [facility] Adult Case Management Discharge Plan :  Will you be returning to the same living situation after discharge:  No. Will be staying with her son at discharge. At discharge, do you have transportation home?: No. Safe Transport to be arranged Do you have the ability to pay for your medications: Yes,  has insurance  Release of information consent forms completed and in the chart;  Patient's signature needed at discharge.  Patient to Follow up at:  Follow-up Information    [place] Go to.   Specialty: Professional Counselor Why: Please go to this provider during their walk in hours for new patients:  Monday through Friday, from 8:30 am to 12:00 pm and 1:00 pm to 2:30 pm.   Contact information: [address] [place] [address] [phone]               Next level of care provider has access to [facility] Link:no  Safety Planning and Suicide Prevention discussed: Yes,  with patient  Have you used any form of tobacco in the last 30 days? (Cigarettes, Smokeless Tobacco, Cigars, and/or Pipes): No  Has patient been referred to the Quitline?: N/A patient is not a smoker  Patient has been referred for addiction treatment: Pt. refused referral  Otelia Santee, LCSW 12/22/2019, 10:46 AM

## 2019-12-22 NOTE — Progress Notes (Signed)
Recreation Therapy Notes  Date: 10.18.21 Time: 0930 Location: 300 [address] Dayroom  Group Topic: Stress Management  Goal Area(s) Addresses:  Patient will identify positive stress management techniques. Patient will identify benefits of using stress management post d/c.  Intervention: Stress Management  Activity:  Meditation.  LRT played a meditation that focused on being resilient.  Patients were to listen and follow along as meditation played to engage in activity.    Education:  Stress Management, Discharge Planning.   Education Outcome: Acknowledges Education  Clinical Observations/Feedback: Pt did not attend group session.    Caroll Rancher, LRT/CTRS         Caroll Rancher A 12/22/2019 11:38 AM

## 2019-12-22 NOTE — Progress Notes (Signed)
RN met with pt and reviewed pt's discharge instructions.  Pt verbalized understanding of discharge instructions and pt did not have any questions. RN reviewed and provided pt with a copy of SRA, AVS and Transition Record.  RN returned pt's belongings to pt.  Pt denied SI and HI.  Patient discharged to the lobby where safe transport driver waiting to take pt home.

## 2019-12-23 LAB — METHYLMALONIC ACID, SERUM: Methylmalonic Acid, Quantitative: 300 nmol/L (ref 0–378)

## 2019-12-30 ENCOUNTER — Emergency Department (HOSPITAL_COMMUNITY)
Admission: EM | Admit: 2019-12-30 | Discharge: 2019-12-31 | Disposition: A | Discharge status: Home / Self Care | Payer: Medicare Other | Attending: Emergency Medicine | Admitting: Emergency Medicine

## 2019-12-30 ENCOUNTER — Encounter (HOSPITAL_COMMUNITY): Payer: Self-pay | Admitting: Emergency Medicine

## 2019-12-30 ENCOUNTER — Other Ambulatory Visit: Payer: Self-pay

## 2019-12-30 DIAGNOSIS — Z87891 Personal history of nicotine dependence: Secondary | ICD-10-CM | POA: Insufficient documentation

## 2019-12-30 DIAGNOSIS — I2510 Atherosclerotic heart disease of native coronary artery without angina pectoris: Secondary | ICD-10-CM | POA: Diagnosis not present

## 2019-12-30 DIAGNOSIS — I129 Hypertensive chronic kidney disease with stage 1 through stage 4 chronic kidney disease, or unspecified chronic kidney disease: Secondary | ICD-10-CM | POA: Insufficient documentation

## 2019-12-30 DIAGNOSIS — I422 Other hypertrophic cardiomyopathy: Secondary | ICD-10-CM | POA: Insufficient documentation

## 2019-12-30 DIAGNOSIS — L299 Pruritus, unspecified: Secondary | ICD-10-CM | POA: Diagnosis not present

## 2019-12-30 DIAGNOSIS — Z79899 Other long term (current) drug therapy: Secondary | ICD-10-CM | POA: Diagnosis not present

## 2019-12-30 DIAGNOSIS — N183 Chronic kidney disease, stage 3 unspecified: Secondary | ICD-10-CM | POA: Insufficient documentation
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
## 2019-12-31 ENCOUNTER — Encounter (HOSPITAL_COMMUNITY): Payer: Self-pay

## 2019-12-31 ENCOUNTER — Emergency Department (HOSPITAL_COMMUNITY)
Admission: EM | Admit: 2019-12-31 | Discharge: 2019-12-31 | Disposition: A | Discharge status: Home / Self Care | Payer: Medicare Other | Source: Home / Self Care | Attending: Emergency Medicine | Admitting: Emergency Medicine

## 2019-12-31 ENCOUNTER — Other Ambulatory Visit: Payer: Self-pay

## 2019-12-31 DIAGNOSIS — I2510 Atherosclerotic heart disease of native coronary artery without angina pectoris: Secondary | ICD-10-CM | POA: Insufficient documentation

## 2019-12-31 DIAGNOSIS — N182 Chronic kidney disease, stage 2 (mild): Secondary | ICD-10-CM | POA: Insufficient documentation

## 2019-12-31 DIAGNOSIS — I129 Hypertensive chronic kidney disease with stage 1 through stage 4 chronic kidney disease, or unspecified chronic kidney disease: Secondary | ICD-10-CM | POA: Insufficient documentation

## 2019-12-31 DIAGNOSIS — Z87891 Personal history of nicotine dependence: Secondary | ICD-10-CM | POA: Insufficient documentation

## 2019-12-31 DIAGNOSIS — L299 Pruritus, unspecified: Secondary | ICD-10-CM | POA: Diagnosis not present

## 2019-12-31 DIAGNOSIS — Z79899 Other long term (current) drug therapy: Secondary | ICD-10-CM | POA: Insufficient documentation

## 2019-12-31 DIAGNOSIS — I10 Essential (primary) hypertension: Secondary | ICD-10-CM

## 2019-12-31 LAB — BASIC METABOLIC PANEL
Anion gap: 10 (ref 5–15)
BUN: 24 mg/dL — ABNORMAL HIGH (ref 8–23)
CO2: 24 mmol/L (ref 22–32)
Calcium: 9.3 mg/dL (ref 8.9–10.3)
Chloride: 106 mmol/L (ref 98–111)
Creatinine, Ser: 1.28 mg/dL — ABNORMAL HIGH (ref 0.44–1.00)
GFR, Estimated: 46 mL/min — ABNORMAL LOW (ref >60)
Glucose, Bld: 96 mg/dL (ref 70–99)
Potassium: 4 mmol/L (ref 3.5–5.1)
Sodium: 140 mmol/L (ref 135–145)

## 2019-12-31 LAB — CBC WITH DIFFERENTIAL/PLATELET
Abs Immature Granulocytes: 0.02 10*3/uL (ref 0.00–0.07)
Basophils Absolute: 0 10*3/uL (ref 0.0–0.1)
Basophils Relative: 0 %
Eosinophils Absolute: 0.2 10*3/uL (ref 0.0–0.5)
Eosinophils Relative: 3 %
HCT: 39 % (ref 36.0–46.0)
Hemoglobin: 12.2 g/dL (ref 12.0–15.0)
Immature Granulocytes: 0 %
Lymphocytes Relative: 31 %
Lymphs Abs: 2.5 10*3/uL (ref 0.7–4.0)
MCH: 29.8 pg (ref 26.0–34.0)
MCHC: 31.3 g/dL (ref 30.0–36.0)
MCV: 95.4 fL (ref 80.0–100.0)
Monocytes Absolute: 0.6 10*3/uL (ref 0.1–1.0)
Monocytes Relative: 8 %
Neutro Abs: 4.7 10*3/uL (ref 1.7–7.7)
Neutrophils Relative %: 58 %
Platelets: 317 10*3/uL (ref 150–400)
RBC: 4.09 MIL/uL (ref 3.87–5.11)
RDW: 13.7 % (ref 11.5–15.5)
WBC: 8.1 10*3/uL (ref 4.0–10.5)
nRBC: 0 % (ref 0.0–0.2)

## 2019-12-31 LAB — BRAIN NATRIURETIC PEPTIDE: B Natriuretic Peptide: 228.3 pg/mL — ABNORMAL HIGH (ref 0.0–100.0)

## 2019-12-31 NOTE — ED Triage Notes (Signed)
Pt c/o her blood pressure being too high, 199/134 in triage. Pt c/o leg and ankle swelling bilaterally. Pt states she was seen at [facility] last night/early this morning for same complaints.

## 2019-12-31 NOTE — ED Notes (Signed)
This RN attempted to reach pt family via demographics for transport home without success.
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
## 2019-12-31 NOTE — Discharge Instructions (Addendum)
Recommend taking your present prescribed medications.  Recommend referral to ER if you develop chest pain, difficulty in breathing or other new concerning symptom.  Please schedule a follow-up appointment, daily to be seen in the next couple days with your primary doctor.

## 2020-01-08 ENCOUNTER — Other Ambulatory Visit: Payer: Self-pay

## 2020-01-08 ENCOUNTER — Inpatient Hospital Stay (HOSPITAL_COMMUNITY)
Admission: AD | Admit: 2020-01-08 | Discharge: 2020-01-08 | Disposition: A | Discharge status: Home / Self Care | Payer: Medicare Other | Attending: Obstetrics and Gynecology | Admitting: Obstetrics and Gynecology

## 2020-01-08 DIAGNOSIS — Z5321 Procedure and treatment not carried out due to patient leaving prior to being seen by health care provider: Secondary | ICD-10-CM | POA: Insufficient documentation

## 2020-01-08 DIAGNOSIS — R462 Strange and inexplicable behavior: Secondary | ICD-10-CM | POA: Diagnosis present

## 2020-01-08 DIAGNOSIS — F209 Schizophrenia, unspecified: Secondary | ICD-10-CM | POA: Insufficient documentation
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
## 2020-05-13 ENCOUNTER — Other Ambulatory Visit: Payer: Self-pay

## 2020-05-13 ENCOUNTER — Inpatient Hospital Stay (HOSPITAL_COMMUNITY)
Admission: EM | Admit: 2020-05-13 | Discharge: 2020-05-20 | DRG: 291 | Disposition: A | Discharge status: Home / Self Care | Payer: Medicare Other | Attending: Internal Medicine | Admitting: Internal Medicine

## 2020-05-13 ENCOUNTER — Encounter (HOSPITAL_COMMUNITY): Payer: Self-pay | Admitting: Pharmacy Technician

## 2020-05-13 ENCOUNTER — Emergency Department (HOSPITAL_COMMUNITY): Payer: Medicare Other

## 2020-05-13 DIAGNOSIS — Z79899 Other long term (current) drug therapy: Secondary | ICD-10-CM

## 2020-05-13 DIAGNOSIS — E669 Obesity, unspecified: Secondary | ICD-10-CM | POA: Diagnosis present

## 2020-05-13 DIAGNOSIS — Z9114 Patient's other noncompliance with medication regimen: Secondary | ICD-10-CM

## 2020-05-13 DIAGNOSIS — I160 Hypertensive urgency: Secondary | ICD-10-CM | POA: Diagnosis present

## 2020-05-13 DIAGNOSIS — I739 Peripheral vascular disease, unspecified: Secondary | ICD-10-CM | POA: Diagnosis present

## 2020-05-13 DIAGNOSIS — I10 Essential (primary) hypertension: Secondary | ICD-10-CM | POA: Diagnosis present

## 2020-05-13 DIAGNOSIS — I130 Hypertensive heart and chronic kidney disease with heart failure and stage 1 through stage 4 chronic kidney disease, or unspecified chronic kidney disease: Principal | ICD-10-CM | POA: Diagnosis present

## 2020-05-13 DIAGNOSIS — Z6835 Body mass index (BMI) 35.0-35.9, adult: Secondary | ICD-10-CM

## 2020-05-13 DIAGNOSIS — E872 Acidosis: Secondary | ICD-10-CM | POA: Diagnosis present

## 2020-05-13 DIAGNOSIS — I2510 Atherosclerotic heart disease of native coronary artery without angina pectoris: Secondary | ICD-10-CM | POA: Diagnosis present

## 2020-05-13 DIAGNOSIS — F200 Paranoid schizophrenia: Secondary | ICD-10-CM | POA: Diagnosis present

## 2020-05-13 DIAGNOSIS — I350 Nonrheumatic aortic (valve) stenosis: Secondary | ICD-10-CM | POA: Diagnosis present

## 2020-05-13 DIAGNOSIS — R0602 Shortness of breath: Secondary | ICD-10-CM

## 2020-05-13 DIAGNOSIS — I509 Heart failure, unspecified: Secondary | ICD-10-CM

## 2020-05-13 DIAGNOSIS — N179 Acute kidney failure, unspecified: Secondary | ICD-10-CM | POA: Diagnosis present

## 2020-05-13 DIAGNOSIS — Z008 Encounter for other general examination: Secondary | ICD-10-CM

## 2020-05-13 DIAGNOSIS — D631 Anemia in chronic kidney disease: Secondary | ICD-10-CM | POA: Diagnosis present

## 2020-05-13 DIAGNOSIS — Z96611 Presence of right artificial shoulder joint: Secondary | ICD-10-CM | POA: Diagnosis present

## 2020-05-13 DIAGNOSIS — R778 Other specified abnormalities of plasma proteins: Secondary | ICD-10-CM

## 2020-05-13 DIAGNOSIS — I5043 Acute on chronic combined systolic (congestive) and diastolic (congestive) heart failure: Secondary | ICD-10-CM | POA: Diagnosis present

## 2020-05-13 DIAGNOSIS — Z7982 Long term (current) use of aspirin: Secondary | ICD-10-CM

## 2020-05-13 DIAGNOSIS — N1832 Chronic kidney disease, stage 3b: Secondary | ICD-10-CM | POA: Diagnosis present

## 2020-05-13 DIAGNOSIS — Z9119 Patient's noncompliance with other medical treatment and regimen: Secondary | ICD-10-CM

## 2020-05-13 DIAGNOSIS — E782 Mixed hyperlipidemia: Secondary | ICD-10-CM | POA: Diagnosis present

## 2020-05-13 DIAGNOSIS — J9621 Acute and chronic respiratory failure with hypoxia: Secondary | ICD-10-CM | POA: Diagnosis present

## 2020-05-13 DIAGNOSIS — Z20822 Contact with and (suspected) exposure to covid-19: Secondary | ICD-10-CM | POA: Diagnosis present

## 2020-05-13 DIAGNOSIS — F17210 Nicotine dependence, cigarettes, uncomplicated: Secondary | ICD-10-CM | POA: Diagnosis present

## 2020-05-13 DIAGNOSIS — I493 Ventricular premature depolarization: Secondary | ICD-10-CM | POA: Diagnosis present

## 2020-05-13 DIAGNOSIS — I169 Hypertensive crisis, unspecified: Secondary | ICD-10-CM | POA: Diagnosis not present

## 2020-05-13 DIAGNOSIS — R7989 Other specified abnormal findings of blood chemistry: Secondary | ICD-10-CM

## 2020-05-13 DIAGNOSIS — I422 Other hypertrophic cardiomyopathy: Secondary | ICD-10-CM | POA: Diagnosis present

## 2020-05-13 DIAGNOSIS — Z981 Arthrodesis status: Secondary | ICD-10-CM

## 2020-05-13 DIAGNOSIS — J9622 Acute and chronic respiratory failure with hypercapnia: Secondary | ICD-10-CM | POA: Diagnosis present

## 2020-05-13 DIAGNOSIS — I471 Supraventricular tachycardia: Secondary | ICD-10-CM | POA: Diagnosis present

## 2020-05-13 LAB — BASIC METABOLIC PANEL
Anion gap: 6 (ref 5–15)
BUN: 28 mg/dL — ABNORMAL HIGH (ref 8–23)
CO2: 23 mmol/L (ref 22–32)
Calcium: 9 mg/dL (ref 8.9–10.3)
Chloride: 111 mmol/L (ref 98–111)
Creatinine, Ser: 1.27 mg/dL — ABNORMAL HIGH (ref 0.44–1.00)
GFR, Estimated: 46 mL/min — ABNORMAL LOW (ref >60)
Glucose, Bld: 105 mg/dL — ABNORMAL HIGH (ref 70–99)
Potassium: 3.7 mmol/L (ref 3.5–5.1)
Sodium: 140 mmol/L (ref 135–145)

## 2020-05-13 LAB — CBC
HCT: 33.3 % — ABNORMAL LOW (ref 36.0–46.0)
Hemoglobin: 10.1 g/dL — ABNORMAL LOW (ref 12.0–15.0)
MCH: 29.7 pg (ref 26.0–34.0)
MCHC: 30.3 g/dL (ref 30.0–36.0)
MCV: 97.9 fL (ref 80.0–100.0)
Platelets: 313 10*3/uL (ref 150–400)
RBC: 3.4 MIL/uL — ABNORMAL LOW (ref 3.87–5.11)
RDW: 17 % — ABNORMAL HIGH (ref 11.5–15.5)
WBC: 6.3 10*3/uL (ref 4.0–10.5)
nRBC: 0.3 % — ABNORMAL HIGH (ref 0.0–0.2)

## 2020-05-13 LAB — HIV ANTIBODY (ROUTINE TESTING W REFLEX): HIV Screen 4th Generation wRfx: NONREACTIVE

## 2020-05-13 LAB — FERRITIN: Ferritin: 86 ng/mL (ref 11–307)

## 2020-05-13 LAB — RAPID URINE DRUG SCREEN, HOSP PERFORMED
Amphetamines: NOT DETECTED
Barbiturates: NOT DETECTED
Benzodiazepines: NOT DETECTED
Cocaine: NOT DETECTED
Opiates: NOT DETECTED
Tetrahydrocannabinol: NOT DETECTED

## 2020-05-13 LAB — HEMOGLOBIN A1C
Hgb A1c MFr Bld: 5.2 % (ref 4.8–5.6)
Mean Plasma Glucose: 102.54 mg/dL

## 2020-05-13 LAB — PHOSPHORUS: Phosphorus: 3 mg/dL (ref 2.5–4.6)

## 2020-05-13 LAB — BRAIN NATRIURETIC PEPTIDE: B Natriuretic Peptide: 2524 pg/mL — ABNORMAL HIGH (ref 0.0–100.0)

## 2020-05-13 LAB — MAGNESIUM: Magnesium: 2 mg/dL (ref 1.7–2.4)

## 2020-05-13 LAB — RESP PANEL BY RT-PCR (FLU A&B, COVID) ARPGX2
Influenza A by PCR: NEGATIVE
Influenza B by PCR: NEGATIVE
SARS Coronavirus 2 by RT PCR: NEGATIVE

## 2020-05-13 LAB — TSH: TSH: 1.059 u[IU]/mL (ref 0.350–4.500)

## 2020-05-13 LAB — TROPONIN I (HIGH SENSITIVITY)
Troponin I (High Sensitivity): 35 ng/L — ABNORMAL HIGH (ref <18)
Troponin I (High Sensitivity): 37 ng/L — ABNORMAL HIGH (ref <18)

## 2020-05-13 MED ORDER — ACETAMINOPHEN 325 MG PO TABS
650.0000 mg | ORAL_TABLET | Freq: Four times a day (QID) | ORAL | Status: DC | PRN
  Administered 2020-05-13 – 2020-05-18 (×5): 650 mg via ORAL
  Filled 2020-05-13 (×6): qty 2

## 2020-05-13 MED ORDER — ONDANSETRON HCL 4 MG PO TABS: 4.0000 mg | ORAL_TABLET | Freq: Four times a day (QID) | ORAL | Status: DC | PRN

## 2020-05-13 MED ORDER — LOSARTAN POTASSIUM 25 MG PO TABS
25.0000 mg | ORAL_TABLET | Freq: Every day | ORAL | Status: DC
  Administered 2020-05-13 – 2020-05-15 (×3): 25 mg via ORAL
  Filled 2020-05-13 (×3): qty 1

## 2020-05-13 MED ORDER — ENOXAPARIN SODIUM 40 MG/0.4ML ~~LOC~~ SOLN
40.0000 mg | SUBCUTANEOUS | Status: DC
  Administered 2020-05-13 – 2020-05-20 (×6): 40 mg via SUBCUTANEOUS
  Filled 2020-05-13 (×7): qty 0.4

## 2020-05-13 MED ORDER — LABETALOL HCL 5 MG/ML IV SOLN
10.0000 mg | Freq: Once | INTRAVENOUS | Status: AC
  Administered 2020-05-13: 10 mg via INTRAVENOUS
  Filled 2020-05-13: qty 4

## 2020-05-13 MED ORDER — ADULT MULTIVITAMIN W/MINERALS CH
1.0000 | ORAL_TABLET | Freq: Every day | ORAL | Status: DC
  Administered 2020-05-13 – 2020-05-20 (×8): 1 via ORAL
  Filled 2020-05-13 (×8): qty 1

## 2020-05-13 MED ORDER — LABETALOL HCL 5 MG/ML IV SOLN
5.0000 mg | Freq: Once | INTRAVENOUS | Status: AC | PRN
  Administered 2020-05-13: 5 mg via INTRAVENOUS
  Filled 2020-05-13 (×2): qty 4

## 2020-05-13 MED ORDER — FOLIC ACID 1 MG PO TABS
1.0000 mg | ORAL_TABLET | Freq: Every day | ORAL | Status: DC
  Administered 2020-05-13 – 2020-05-20 (×8): 1 mg via ORAL
  Filled 2020-05-13 (×8): qty 1

## 2020-05-13 MED ORDER — AMLODIPINE BESYLATE 5 MG PO TABS: 5.0000 mg | ORAL_TABLET | Freq: Every day | ORAL | Status: DC

## 2020-05-13 MED ORDER — SENNA 8.6 MG PO TABS: 1.0000 | ORAL_TABLET | Freq: Every evening | ORAL | Status: DC | PRN

## 2020-05-13 MED ORDER — FUROSEMIDE 10 MG/ML IJ SOLN
40.0000 mg | Freq: Once | INTRAMUSCULAR | Status: AC
  Administered 2020-05-13: 40 mg via INTRAVENOUS
  Filled 2020-05-13: qty 4

## 2020-05-13 MED ORDER — THIAMINE HCL 100 MG PO TABS
100.0000 mg | ORAL_TABLET | Freq: Every day | ORAL | Status: DC
  Administered 2020-05-13 – 2020-05-20 (×8): 100 mg via ORAL
  Filled 2020-05-13 (×8): qty 1

## 2020-05-13 MED ORDER — ONDANSETRON HCL 4 MG/2ML IJ SOLN
4.0000 mg | Freq: Four times a day (QID) | INTRAMUSCULAR | Status: DC | PRN
  Administered 2020-05-16: 4 mg via INTRAVENOUS
  Filled 2020-05-13: qty 2

## 2020-05-13 MED ORDER — OLANZAPINE 5 MG PO TABS
5.0000 mg | ORAL_TABLET | Freq: Every day | ORAL | Status: DC
  Administered 2020-05-13: 5 mg via ORAL
  Filled 2020-05-13 (×2): qty 1

## 2020-05-13 MED ORDER — LABETALOL HCL 5 MG/ML IV SOLN
20.0000 mg | Freq: Once | INTRAVENOUS | Status: AC
  Administered 2020-05-13: 20 mg via INTRAVENOUS
  Filled 2020-05-13: qty 4

## 2020-05-13 MED ORDER — HYDRALAZINE HCL 20 MG/ML IJ SOLN
10.0000 mg | Freq: Once | INTRAMUSCULAR | Status: AC
  Administered 2020-05-13: 10 mg via INTRAVENOUS
  Filled 2020-05-13: qty 1

## 2020-05-13 MED ORDER — ACETAMINOPHEN 650 MG RE SUPP: 650.0000 mg | Freq: Four times a day (QID) | RECTAL | Status: DC | PRN

## 2020-05-13 MED ORDER — SODIUM CHLORIDE 0.9% FLUSH
3.0000 mL | Freq: Two times a day (BID) | INTRAVENOUS | Status: DC
  Administered 2020-05-13 – 2020-05-20 (×11): 3 mL via INTRAVENOUS

## 2020-05-13 MED ORDER — ATORVASTATIN CALCIUM 40 MG PO TABS
40.0000 mg | ORAL_TABLET | Freq: Every day | ORAL | Status: DC
  Administered 2020-05-13 – 2020-05-20 (×8): 40 mg via ORAL
  Filled 2020-05-13 (×7): qty 1
  Filled 2020-05-13: qty 4
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
## 2020-05-13 NOTE — Hospital Course (Addendum)
Follow-up:  Moderate AS, TR, and mild MR - Can be monitored outpatient  Atrial Tachycardia, Resolved Acute Hypoxic Respiratory Failure, Resolved Likely in setting of volume overload.  Needs outpatient colonoscopy and iron studies   - Check repeat EKG given new ST segment depressions noted in V2   - Continuous telemetry - Consider switching Coreg to Metoprolol succinate if atrial tachycardia recurs

## 2020-05-13 NOTE — H&P (Cosign Needed)
Date: 05/13/2020               Patient Name:  Erin Riley MRN: [id number]  DOB: [birth year] Age / Sex: 70 y.o., female   PCP: Pcp, No         Medical Service: Internal Medicine Teaching Service         Attending Physician: Dr. Debe Coder    First Contact: Dr. Laddie Aquas Pager: [phone]  Second Contact: Dr. Marchia Bond  Pager: [phone]       After Hours (After 5p/  First Contact Pager: [phone]  weekends / holidays): Second Contact Pager: [phone]   Chief Complaint: Shortness of Breath  History of Present Illness:   Erin Riley is a 40 obese y.o. lady w/ PMHx hypertensive hypertrophic cardiomyopathy, aortic stenosis, subclavian artery stenosis, PAD, CKD stage 3, HTN, HLD, schizophrenia, and lumbar spinal fusion who presented to the ED due to worsening shortness of breath and bilateral lower extremity swelling over the past week that acutely worsened over the past 3 days. Her breathing is worse laying down and she endorses associated productive cough with beige, frothy phlegm, nausea, vomiting (although most likely post-tussive), and abdominal swelling. She denies any CP, palpitations, light-headedness, dizziness, weakness or numbness. Notes previous burning pain in her umbilicus and lower back pain, although both have resolved. Denies any history of MI or cardiac procedures. States she has not been taking any of her medications as she "doesn't need them". States she receives her care from [facility] and was supposed to have her medications refilled 2 days ago but did not receive them.   Home Medications: Patient prescribed the following per Epic although not taking any of the following recently: Amlodipine 10mg  daily Atorvastatin 50m daily Carvedilol 25mg  twice daily WC Clonidine 0.1mg  TID  Hydralazine 50mg  q6 hours  Imdur 60mg  daily Losartan-HCTZ 100-25mg  daily Zyprexa 10mg  in the morning, 20mg  in the evening daily  Allergies: Allergies as of 05/13/2020  . (No Known Allergies)    Past Medical History:  Diagnosis Date  . Chronic back pain   . Chronic kidney disease (CKD), stage II (mild)    Class I-II  . Coronary artery disease 04/2009   50% stenosis in the perforator of LAD; catheterization was for an abnormal Myoview in January 2000 showing anterior and inferolateral ischemia.  . Diverticulitis   . 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 > 3 subsequent Echos show improved/normal EF with moderate to severe concentric LVH and diastolic dysfunction with LVOT/intracavitary gradient --> 06/2016: Severe LVH.  Vigorous EF, 65-70%.?? Gr 1 DD. Mild AS.  07/13/2020 Hyperlipidemia   . Hypertension   . Hypertensive hypertrophic cardiomyopathy: NYHA class II:  Echo: Severe concentric LVH with LV OT gradient; essentially preserved EF with diastolic dysfunction 02/15/2013   Echo 06/2016: Severe Concentric LVH. Vigorous EF 65-70%. ~ Gr I DD.   . Mild aortic stenosis by prior echocardiography    Echo 06/2016: Mild AS (Mean Gradient 15 mmHg); has had prior Mod-Severe MR (not seen on current echo)  . PAD (peripheral artery disease) [facility]) March 2013   Lower extremity Dopplers: R. SFA 50-60%, R. PTA proximally occluded with distal reconstitution;; L. common iliac ~50%, L. SFA 50-70% stenosis, L. PTA < 50%  . Schizophrenia (HCC)     Family History:  Family History  Problem Relation Age of Onset  . Hypertension Mother   . Breast cancer Neg  Hx    Social History:  When asked if patient lives at home with family, she states her sister is with the police for stealing her money and belongings (although this appears to be an active delusion).  She states she is smoking cigarettes more frequently now, unable to quantify how many per day although has used tobacco since age 72.  Denies alcohol use or any other illicit drug use.   Review of Systems: A complete ROS was negative except as per HPI.    Physical Exam: Blood pressure (!) 152/127, pulse 65, temperature 98.1 F (36.7 C), temperature source Oral, resp. rate (!) 30, last menstrual period 05/10/2013, SpO2 96 %.  General: Patient appears uncomfortable in no acute distress. Eyes: Sclera non-icteric. No conjunctival injection.   HENT: Slightly dry mucus membranes. No nasal discharge.  Respiratory: Lungs are CTA, bilaterally aside from diminished breath sounds at the bases. Tachypnea present with slight increased work of breathing, although saturating 97% on room air during examination without wheezes, rales, or rhonchi.  Cardiovascular: Regular rate and rhythm. 3/6 SEM heard loudest in the upper right chest. 2/6 blowing holosystolic murmurs present throughout the remainder of the chest. There is 1+ bilateral lower extremity pitting edema below the knees.  Neurological: Patient alert and oriented to person and Month but not year or situation. CN II-XII intact. Moving all extremities equally.  Musculoskeletal: Normal muscle bulk and tone.  Abdominal: Obese. Soft and non-tender to palpation. Bowel sounds intact. No rebound or guarding. Skin: No lesions. No rashes. There is mild erythema and hyperpigmentation of the bilateral lower extremities.  Psych: Normal affect. Normal tone of voice.   EKG: personally reviewed my interpretation is NSR at 87 bpm with LVH, LAE and T wave inversions in V4-V6 consistent with history of hypertensive cardiomyopathy, concerning for demand ischemia although also noted on previous EKG's.  CXR: personally reviewed my interpretation is cardiomegaly with mild vascular congestion and trace bilateral pleural effusions s/p right shoulder replacement.   Assessment & Plan by Problem: Active Problems:   Severe uncontrolled hypertension  Hypertensive Urgency On arrival, SBP elevated >200 mmHg. Patient has been prescribed many antihypertensives in the past per chart review as listed above; however, she states she  has not been taking these at home. Received total of 30mg  IV labetalol and hydralazine 10mg  IV once in the ED with improvement in pressures.  - PRN labetalol 5mg  IV for SBP > 180 or DBP > 110 mmHg  - Start losartan 25mg  daily - Continue close monitoring with goal pressures ~160/110  Acute on Chronic Diastolic CHF Exacerbation BNP , troponin I 35 > 37. CXR with small amount of vascular congestion and trace pleural effusions. EKG with signs of LVH and T wave inversions in V4-V6 also seen on prior EKG's without new signs of acute ischemia. Last ECHO 09/2018 showed normal ventricular systolic function with EF 55-60%, moderate LVH and severe calcification and thickening of the AV with mild-moderate AS and indeterminate diastolic pressures. Previous ECHO from 2018 showed severe LVH with hyperdynamic LV function and grade I diastolic dysfunction. Likely in the setting of uncontrolled HTN. Patient is diuresing well after receiving Lasix 40mg  IV x 1 in the ED. - Repeat ECHO - Continue Lasix 40mg  IV daily for now  - Check TSH, Mag, Phosphorus - Strict I&O - Daily weights   Aortic Stenosis  Last ECHO 09/2018 demonstrated severe calcification and thickening of the AV with mild-moderate AS. Does have 3/6 SEM heard best in the right upper  chest consistent with AS murmur. Would benefit from further afterload reduction. - Check TTE  Frequent PVC's  Patient noted to have multiple PVC's on telemetry during examination. Likely secondary to cardiac stress 2/2 volume overload and severe HTN.  - Continuous telemetry monitoring  - Keep K > 4, Mg > 2 - Avoid BB/CCB in the setting of   CKD Stage IIIb Creatinine 1.27, BUN 28, GFR 46. Labs are similar to patient's baseline without evidence of AKI.  - Start Losartan 25mg  daily - Continue to monitor renal function   Acute Normocytic Anemia  Hemoglobin 10.1, MCV 97.9, with increased RDW. This anemia is new, with baseline creatinine >12 although without other cell  count changes. Notes recent history of periumbilical abdominal pain and low back pain, although the latter is chronic, both resolved, and denies GI blood loss or any other bleeding.  - Continue to monitor daily CBC - Check ferritin level  Paranoid Schizophrenia  Patient has active paranoia with delusions, stating her daughter is with the police who are taking away her credit card and stole her clothes from her home before arrival. States there are people watching her virtually and believes she is edentulous as her children lowered her calcium at birth. She is oriented to person and month but not place or time. Olanzapine listed in previous medications, although patient states she has not taken any of her medications PTA. Does make references suggesting she may have unstable housing. UDS negative.  - Restart olanzapine at low dose, 5mg  once now - Assess response in the morning - Would benefit from psychiatric consultation if persistent  - Multivitamin, Folic acid, Thiamine daily - TOC consult placed for PCP needs, medication assistance, and concern for housing instability   PAD HLD  Patient has a history of significant PAD of the lower extremities as as mixed hyperlipidemia.  - Check lipid panel in the morning  - Start Atorvastatin 40mg  daily now - Would likely benefit from addition of ASA if Hgb remains stable   Nausea  Patient endorses emesis although unclear if this was post-tussive. Unable to elaborate further. Denies current abdominal pain or other recent vomiting.  - Zofran 4mg  6hrs PRN  Code Status: Full Code Diet: Heart Healthy DVT PPx: Lovenox  IVF: none   Dispo: Admit patient to Observation with expected length of stay less than 2 midnights.  Signed: , MD 05/13/2020, 8:01 PM  Pager: [phone] After 5pm on weekdays and 1pm on weekends: On Call pager: [phone]

## 2020-05-13 NOTE — ED Triage Notes (Signed)
Pt here with reports of ble leg edema with associated shob X1 week. Pt hypertensive in triage with noticeable shob although saturations are 97%.

## 2020-05-14 ENCOUNTER — Observation Stay (HOSPITAL_BASED_OUTPATIENT_CLINIC_OR_DEPARTMENT_OTHER): Payer: Medicare Other

## 2020-05-14 ENCOUNTER — Observation Stay (HOSPITAL_COMMUNITY): Payer: Medicare Other

## 2020-05-14 DIAGNOSIS — Z79899 Other long term (current) drug therapy: Secondary | ICD-10-CM | POA: Diagnosis not present

## 2020-05-14 DIAGNOSIS — N179 Acute kidney failure, unspecified: Secondary | ICD-10-CM | POA: Diagnosis present

## 2020-05-14 DIAGNOSIS — I110 Hypertensive heart disease with heart failure: Secondary | ICD-10-CM | POA: Diagnosis not present

## 2020-05-14 DIAGNOSIS — E872 Acidosis: Secondary | ICD-10-CM | POA: Diagnosis present

## 2020-05-14 DIAGNOSIS — I493 Ventricular premature depolarization: Secondary | ICD-10-CM | POA: Diagnosis present

## 2020-05-14 DIAGNOSIS — I160 Hypertensive urgency: Secondary | ICD-10-CM

## 2020-05-14 DIAGNOSIS — I5033 Acute on chronic diastolic (congestive) heart failure: Secondary | ICD-10-CM | POA: Diagnosis not present

## 2020-05-14 DIAGNOSIS — F209 Schizophrenia, unspecified: Secondary | ICD-10-CM

## 2020-05-14 DIAGNOSIS — I2729 Other secondary pulmonary hypertension: Secondary | ICD-10-CM

## 2020-05-14 DIAGNOSIS — Z96611 Presence of right artificial shoulder joint: Secondary | ICD-10-CM | POA: Diagnosis present

## 2020-05-14 DIAGNOSIS — R778 Other specified abnormalities of plasma proteins: Secondary | ICD-10-CM | POA: Diagnosis not present

## 2020-05-14 DIAGNOSIS — Z008 Encounter for other general examination: Secondary | ICD-10-CM | POA: Diagnosis not present

## 2020-05-14 DIAGNOSIS — I5021 Acute systolic (congestive) heart failure: Secondary | ICD-10-CM

## 2020-05-14 DIAGNOSIS — I130 Hypertensive heart and chronic kidney disease with heart failure and stage 1 through stage 4 chronic kidney disease, or unspecified chronic kidney disease: Secondary | ICD-10-CM | POA: Diagnosis present

## 2020-05-14 DIAGNOSIS — I471 Supraventricular tachycardia: Secondary | ICD-10-CM | POA: Diagnosis present

## 2020-05-14 DIAGNOSIS — I340 Nonrheumatic mitral (valve) insufficiency: Secondary | ICD-10-CM

## 2020-05-14 DIAGNOSIS — I509 Heart failure, unspecified: Secondary | ICD-10-CM

## 2020-05-14 DIAGNOSIS — I2510 Atherosclerotic heart disease of native coronary artery without angina pectoris: Secondary | ICD-10-CM | POA: Diagnosis present

## 2020-05-14 DIAGNOSIS — Z7982 Long term (current) use of aspirin: Secondary | ICD-10-CM | POA: Diagnosis not present

## 2020-05-14 DIAGNOSIS — Z6835 Body mass index (BMI) 35.0-35.9, adult: Secondary | ICD-10-CM | POA: Diagnosis not present

## 2020-05-14 DIAGNOSIS — R0602 Shortness of breath: Secondary | ICD-10-CM

## 2020-05-14 DIAGNOSIS — N170 Acute kidney failure with tubular necrosis: Secondary | ICD-10-CM | POA: Diagnosis not present

## 2020-05-14 DIAGNOSIS — I739 Peripheral vascular disease, unspecified: Secondary | ICD-10-CM | POA: Diagnosis present

## 2020-05-14 DIAGNOSIS — F17210 Nicotine dependence, cigarettes, uncomplicated: Secondary | ICD-10-CM | POA: Diagnosis present

## 2020-05-14 DIAGNOSIS — I10 Essential (primary) hypertension: Secondary | ICD-10-CM | POA: Diagnosis not present

## 2020-05-14 DIAGNOSIS — N183 Chronic kidney disease, stage 3 unspecified: Secondary | ICD-10-CM

## 2020-05-14 DIAGNOSIS — I350 Nonrheumatic aortic (valve) stenosis: Secondary | ICD-10-CM | POA: Diagnosis not present

## 2020-05-14 DIAGNOSIS — Z9114 Patient's other noncompliance with medication regimen: Secondary | ICD-10-CM | POA: Diagnosis not present

## 2020-05-14 DIAGNOSIS — N1832 Chronic kidney disease, stage 3b: Secondary | ICD-10-CM | POA: Diagnosis present

## 2020-05-14 DIAGNOSIS — I422 Other hypertrophic cardiomyopathy: Secondary | ICD-10-CM | POA: Diagnosis present

## 2020-05-14 DIAGNOSIS — D631 Anemia in chronic kidney disease: Secondary | ICD-10-CM | POA: Diagnosis present

## 2020-05-14 DIAGNOSIS — I169 Hypertensive crisis, unspecified: Secondary | ICD-10-CM | POA: Diagnosis present

## 2020-05-14 DIAGNOSIS — I5043 Acute on chronic combined systolic (congestive) and diastolic (congestive) heart failure: Secondary | ICD-10-CM | POA: Diagnosis present

## 2020-05-14 DIAGNOSIS — F200 Paranoid schizophrenia: Secondary | ICD-10-CM | POA: Diagnosis present

## 2020-05-14 DIAGNOSIS — J9622 Acute and chronic respiratory failure with hypercapnia: Secondary | ICD-10-CM | POA: Diagnosis present

## 2020-05-14 DIAGNOSIS — Z20822 Contact with and (suspected) exposure to covid-19: Secondary | ICD-10-CM | POA: Diagnosis present

## 2020-05-14 DIAGNOSIS — J9621 Acute and chronic respiratory failure with hypoxia: Secondary | ICD-10-CM | POA: Diagnosis present

## 2020-05-14 DIAGNOSIS — Z981 Arthrodesis status: Secondary | ICD-10-CM | POA: Diagnosis not present

## 2020-05-14 DIAGNOSIS — E669 Obesity, unspecified: Secondary | ICD-10-CM | POA: Diagnosis present

## 2020-05-14 LAB — BLOOD GAS, ARTERIAL
Acid-Base Excess: 1.5 mmol/L (ref 0.0–2.0)
Bicarbonate: 26.7 mmol/L (ref 20.0–28.0)
Drawn by: 31996
FIO2: 28
O2 Saturation: 98.2 %
Patient temperature: 37
pCO2 arterial: 51.2 mmHg — ABNORMAL HIGH (ref 32.0–48.0)
pH, Arterial: 7.337 — ABNORMAL LOW (ref 7.350–7.450)
pO2, Arterial: 105 mmHg (ref 83.0–108.0)

## 2020-05-14 LAB — COMPREHENSIVE METABOLIC PANEL
ALT: 22 U/L (ref 0–44)
AST: 20 U/L (ref 15–41)
Albumin: 3.2 g/dL — ABNORMAL LOW (ref 3.5–5.0)
Alkaline Phosphatase: 68 U/L (ref 38–126)
Anion gap: 7 (ref 5–15)
BUN: 28 mg/dL — ABNORMAL HIGH (ref 8–23)
CO2: 26 mmol/L (ref 22–32)
Calcium: 9 mg/dL (ref 8.9–10.3)
Chloride: 108 mmol/L (ref 98–111)
Creatinine, Ser: 1.54 mg/dL — ABNORMAL HIGH (ref 0.44–1.00)
GFR, Estimated: 37 mL/min — ABNORMAL LOW (ref >60)
Glucose, Bld: 104 mg/dL — ABNORMAL HIGH (ref 70–99)
Potassium: 3.6 mmol/L (ref 3.5–5.1)
Sodium: 141 mmol/L (ref 135–145)
Total Bilirubin: 0.8 mg/dL (ref 0.3–1.2)
Total Protein: 6.3 g/dL — ABNORMAL LOW (ref 6.5–8.1)

## 2020-05-14 LAB — ECHOCARDIOGRAM COMPLETE
AR max vel: 1.28 cm2
AV Area VTI: 1.26 cm2
AV Area mean vel: 1.23 cm2
AV Mean grad: 25.3 mmHg
AV Peak grad: 48 mmHg
Ao pk vel: 3.47 m/s
Area-P 1/2: 3.12 cm2
Calc EF: 36.3 %
MV M vel: 5.03 m/s
MV Peak grad: 101.2 mmHg
MV VTI: 2.53 cm2
P 1/2 time: 471 msec
Radius: 0.4 cm
S' Lateral: 4.5 cm
Single Plane A2C EF: 41.6 %
Single Plane A4C EF: 28.7 %

## 2020-05-14 LAB — CBC
HCT: 31.8 % — ABNORMAL LOW (ref 36.0–46.0)
Hemoglobin: 9.9 g/dL — ABNORMAL LOW (ref 12.0–15.0)
MCH: 30.1 pg (ref 26.0–34.0)
MCHC: 31.1 g/dL (ref 30.0–36.0)
MCV: 96.7 fL (ref 80.0–100.0)
Platelets: 309 10*3/uL (ref 150–400)
RBC: 3.29 MIL/uL — ABNORMAL LOW (ref 3.87–5.11)
RDW: 16.9 % — ABNORMAL HIGH (ref 11.5–15.5)
WBC: 6.2 10*3/uL (ref 4.0–10.5)
nRBC: 0 % (ref 0.0–0.2)

## 2020-05-14 LAB — LIPID PANEL
Cholesterol: 98 mg/dL (ref 0–200)
HDL: 27 mg/dL — ABNORMAL LOW (ref >40)
LDL Cholesterol: 56 mg/dL (ref 0–99)
Total CHOL/HDL Ratio: 3.6 RATIO
Triglycerides: 75 mg/dL (ref <150)
VLDL: 15 mg/dL (ref 0–40)

## 2020-05-14 LAB — IRON AND TIBC
Iron: 48 ug/dL (ref 28–170)
Saturation Ratios: 14 % (ref 10.4–31.8)
TIBC: 340 ug/dL (ref 250–450)
UIBC: 292 ug/dL

## 2020-05-14 LAB — GLUCOSE, CAPILLARY: Glucose-Capillary: 101 mg/dL — ABNORMAL HIGH (ref 70–99)

## 2020-05-14 MED ORDER — FUROSEMIDE 10 MG/ML IJ SOLN
40.0000 mg | Freq: Once | INTRAMUSCULAR | Status: AC
  Administered 2020-05-14: 40 mg via INTRAVENOUS
  Filled 2020-05-14: qty 4

## 2020-05-14 MED ORDER — POTASSIUM CHLORIDE CRYS ER 20 MEQ PO TBCR
40.0000 meq | EXTENDED_RELEASE_TABLET | Freq: Once | ORAL | Status: AC
  Administered 2020-05-14: 40 meq via ORAL
  Filled 2020-05-14: qty 2

## 2020-05-14 MED ORDER — FERROUS GLUCONATE 324 (38 FE) MG PO TABS
324.0000 mg | ORAL_TABLET | Freq: Every day | ORAL | Status: DC
  Administered 2020-05-15 – 2020-05-20 (×6): 324 mg via ORAL
  Filled 2020-05-14 (×7): qty 1

## 2020-05-14 MED ORDER — OLANZAPINE 5 MG PO TABS
5.0000 mg | ORAL_TABLET | Freq: Two times a day (BID) | ORAL | Status: DC
  Administered 2020-05-14 – 2020-05-17 (×6): 5 mg via ORAL
  Filled 2020-05-14 (×6): qty 1

## 2020-05-14 MED ORDER — LABETALOL HCL 5 MG/ML IV SOLN: 10.0000 mg | Freq: Once | INTRAVENOUS | Status: DC | PRN

## 2020-05-14 MED ORDER — CARVEDILOL 12.5 MG PO TABS
12.5000 mg | ORAL_TABLET | Freq: Two times a day (BID) | ORAL | Status: DC
  Administered 2020-05-14 – 2020-05-15 (×2): 12.5 mg via ORAL
  Filled 2020-05-14 (×2): qty 1

## 2020-05-14 MED ORDER — LABETALOL HCL 5 MG/ML IV SOLN: 5.0000 mg | Freq: Once | INTRAVENOUS | Status: DC | PRN

## 2020-05-14 MED ORDER — HYDRALAZINE HCL 50 MG PO TABS
50.0000 mg | ORAL_TABLET | Freq: Three times a day (TID) | ORAL | Status: DC
Start: 2020-05-14 — End: 2020-05-14
  Administered 2020-05-14: 50 mg via ORAL
  Filled 2020-05-14: qty 1

## 2020-05-14 MED ORDER — POTASSIUM CHLORIDE CRYS ER 20 MEQ PO TBCR: 30.0000 meq | EXTENDED_RELEASE_TABLET | Freq: Once | ORAL | Status: DC

## 2020-05-14 MED ORDER — HYDRALAZINE HCL 50 MG PO TABS: 50.0000 mg | ORAL_TABLET | Freq: Three times a day (TID) | ORAL | Status: DC

## 2020-05-14 MED ORDER — LABETALOL HCL 5 MG/ML IV SOLN
5.0000 mg | Freq: Once | INTRAVENOUS | Status: AC | PRN
  Administered 2020-05-14: 5 mg via INTRAVENOUS
  Filled 2020-05-14: qty 4

## 2020-05-14 NOTE — Progress Notes (Signed)
Heart Failure Stewardship Pharmacist Progress Note   PCP: Pcp, No PCP-Cardiologist: Bryan Lemma, MD    HPI:  70 yo F with PMH of CAD, HTN, aortic stenosis, paranoid schizophrenia, CKD III, HLD, and PVD. She presented to the ED on 05/13/20 BLE edema and shortness of breath. She was admitted for HTN urgency, and acute on chronic diastolic CHF exacerbation. An ECHO was done on 05/14/20 and LVEF was 30-35%. Notably noncompliant with medications PTA. She states she lives alone at a motel.  Current HF Medications: Carvedilol 12.5 mg BID Losartan 25 mg daily  Prior to admission HF Medications: None  Pertinent Lab Values: . Serum creatinine 1.54, BUN 28, Potassium 3.6, Sodium 141, BNP 2524.0, Magnesium 2.0   Vital Signs: . Weight: 223 lbs (admission weight: 223 lbs) . Blood pressure: 160/90s  . Heart rate: 60s   Medication Assistance / Insurance Benefits Check: Does the patient have prescription insurance?  Yes Type of insurance plan: Medicare + Medicaid  Outpatient Pharmacy:  Prior to admission outpatient pharmacy: [facility] Is the patient willing to use [facility] TOC pharmacy at discharge? Yes Is the patient willing to transition their outpatient pharmacy to utilize a [facility] outpatient pharmacy?   Pending    Assessment: 1. Acute on chronic systolic CHF (EF 36-62%), likely due to NICM. NYHA class II symptoms. - Continue carvedilol 12.5 mg BID - Continue losartan 25 mg daily - Consider starting spironolactone prior to discharge pending improvement in SCr  - Consider starting hydralazine + Imdur if SCr does not improve   Plan: 1) Medication changes recommended at this time: - Agree with medication changes as above - GDMT optimization pending renal improvement   2) Patient assistance: - High risk for continued medication non-compliance at home - Appreciate HF Navigator's assistance  3)  Education  - To be completed prior to discharge  Sharen Hones, PharmD, BCPS Heart  Failure Stewardship Pharmacist Phone [phone]

## 2020-05-14 NOTE — Consult Note (Addendum)
Cardiology Consultation:   Patient ID: Erin Riley MRN: [id number]; DOB: 1950-04-14  Admit date: 05/13/2020 Date of Consult: 05/14/2020  PCP:  Aviva Kluver   [facility]  Cardiologist:  Bryan Lemma, MD   Patient Profile:   Erin Riley is a 70 y.o. female with a hx of moderate nonobstructive CAD by cardiac cath in 2011, hypertensive heart disease with diastolic dysfunction, aortic stenosis, paranoid schizophrenia, chronic kidney disease stage III, hyperlipidemia and peripheral vascular disease who is being seen today for the evaluation of CHF at the request of Dr. Criselda Peaches.  Cath 04/09/09 showed EF 50% ostial LAD septal.  Echo 2018 with severe LVH, with EF 65-70% no RWMA, G1 DD, mild AS Echo 09/11/2018 showed an LVEF of 55 to 60%, moderate LVH, mild annular mitral calcification, and mild to moderate stenosis of the aortic valve.  Prior history of hypertensive urgency's in setting of noncompliance.   Last seen in clinic 02/2019 by Edd Fabian, NP.  History of Present Illness:   Erin Riley has history of schizophrenia.  Reportedly saying "has lower extremity edema".  History mostly obtained from reviewing chart.  Apparently, patient presented with 1 week history of progressively worsening shortness of breath and lower extremity edema.  Reported cough, nausea, vomiting and abdominal swelling.  No chest pain, palpitation, syncope or melena.  On arrival patient noted to be in hypertensive urgency at blood pressure of 202/134.  Chest x-ray suggesting pulmonary edema.  BNP 2524.  High-sensitivity troponin 35>>37.  Respiratory panel negative for COVID and influenza.  Creatinine 1.27>>1.54.  Hemoglobin A1c 5.2.  TSH normal.  Urine drug screen clear.  Patient was not taking her antihypertensive regimen.  She got IV Lasix 40 mg x 2. Creatinine 1.27>>1.54. Currently on Losartan 25mg  Qd. Most recent BP 161/88.   Echocardiogram today showed LV function of 30 to 35%, grade 3 diastolic  dysfunction, severe LVH, global hypokinesis, RSVP 46.6 mmHg, severely dilated left atrium, mild mitral regurgitation, moderate TR, moderate aortic stenosis with mean gradient 25.2 mmHg.  Dilated ascending aorta at 39 mm.  Per H & P: She is not taking below home meds for few weeks.  Amlodipine 10mg  daily Atorvastatin 48m daily Carvedilol 25mg  twice daily WC Clonidine 0.1mg  TID  Hydralazine 50mg  q6 hours  Imdur 60mg  daily Losartan-HCTZ 100-25mg  daily Zyprexa 10mg  in the morning, 20mg  in the evening daily   Past Medical History:  Diagnosis Date  . Chronic back pain   . Chronic kidney disease (CKD), stage II (mild)    Class I-II  . Coronary artery disease 04/2009   50% stenosis in the perforator of LAD; catheterization was for an abnormal Myoview in January 2000 showing anterior and inferolateral ischemia.  . Diverticulitis   . 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 > 3 subsequent Echos show improved/normal EF with moderate to severe concentric LVH and diastolic dysfunction with LVOT/intracavitary gradient --> 06/2016: Severe LVH.  Vigorous EF, 65-70%.?? Gr 1 DD. Mild AS.  Hyperlipidemia   . Hypertension   . Hypertensive hypertrophic cardiomyopathy: NYHA class II:  Echo: Severe concentric LVH with LV OT gradient; essentially preserved EF with diastolic dysfunction 02/15/2013   Echo 06/2016: Severe Concentric LVH. Vigorous EF 65-70%. ~ Gr I DD.   . Mild aortic stenosis by prior echocardiography    Echo 06/2016: Mild AS (Mean Gradient 15 mmHg); has had prior Mod-Severe MR (not seen on  current echo)  . PAD (peripheral artery disease) [facility]) March 2013   Lower extremity Dopplers: R. SFA 50-60%, R. PTA proximally occluded with distal reconstitution;; L. common iliac ~50%, L. SFA 50-70% stenosis, L. PTA < 50%  . Schizophrenia [facility])     Past Surgical History:  Procedure Laterality Date  .  BUNIONECTOMY    . carotid doppler  05/29/2011   left bulb/prox ICA moderate amtfibrous plaque with no evidence significant reduction.,right bulb /proximal ICA normal patency  . lower extremity doppler  05/29/2011   right SFA 50% to 59% diameter reduction,right posterior tibal atreery occlusive disease,reconstituting distally, left common illiac<50%,left SFA 50 to70%,left post. tibial <50%  . NM MYOCAR PERF WALL MOTION  03/2009   Persantine; EF 51%-both anterior and inferolateral ischemia  . TRANSTHORACIC ECHOCARDIOGRAM  06/2016   Severe LVH.  Vigorous EF of 65-70%.  No RWMA. ~Only grade 1 diastolic dysfunction.  Mild aortic stenosis (mean gradient 15 mmHg)  . TRANSTHORACIC ECHOCARDIOGRAM  07/2012   EF 50-55%; severe concentric LVH; only grade 1 diastolic dysfunction. Mild aortic sclerosis - with LVOT /intracavitary gradient of roughly 20 mmHg mean. Mild to moderately dilated LA;; previously reported MR not seen    Inpatient Medications: Scheduled Meds: . atorvastatin  40 mg Oral Daily  . enoxaparin (LOVENOX) injection  40 mg Subcutaneous Q24H  . folic acid  1 mg Oral Daily  . losartan  25 mg Oral Daily  . multivitamin with minerals  1 tablet Oral Daily  . OLANZapine  5 mg Oral QHS  . sodium chloride flush  3 mL Intravenous Q12H  . thiamine  100 mg Oral Daily   Continuous Infusions:  PRN Meds: acetaminophen **OR** acetaminophen, ondansetron **OR** ondansetron (ZOFRAN) IV, senna  Allergies:   No Known Allergies  Social History:   Social History   Socioeconomic History  . Marital status: Single    Spouse name: Not on file  . Number of children: 7  . Years of education: Not on file  . Highest education level: Not on file  Occupational History  . Not on file  Tobacco Use  . Smoking status: Former Smoker    Types: Cigarettes    Quit date: 05/14/2002    Years since quitting: 18.0  . Smokeless tobacco: Never Used  Vaping Use  . Vaping Use: Never used  Substance and Sexual  Activity  . Alcohol use: No  . Drug use: No  . Sexual activity: Yes    Birth control/protection: Post-menopausal  Other Topics Concern  . Not on file  Social History Narrative   Now single mother of 2 with one grandchild. She quit smoking roughly 5 years ago and is not so since. She has also stopped drinking alcohol. She does try get routine exercise walking at least a mile 3-4 days a week.    She lives with her 47 year old mother. 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   Social Determinants of Health   Financial Resource Strain: Not on file  Food Insecurity: Not on file  Transportation Needs: Not on file  Physical Activity: Not on file  Stress: Not on file  Social Connections: Not on file  Intimate Partner Violence: Not on file    Family History:   Family History  Problem Relation Age of Onset  . Hypertension Mother   . Breast cancer Neg Hx      ROS:  Please see the history of present illness.  All other ROS reviewed and negative.     Physical Exam/Data:  Vitals:   05/14/20 0307 05/14/20 0521 05/14/20 0935 05/14/20 1208  BP: (!) 152/96 (!) 160/94 (!) 161/88   Pulse:  64 64   Resp: Temp:  (!) 97.4 F (36.3 C) 98 F (36.7 C)   TempSrc:  Oral Oral   SpO2:  97% 97%   Weight:    101.2 kg  Height:     (1.702 m)    Intake/Output Summary (Last 24 hours) at 05/14/2020 1401 Last data filed at 05/14/2020 1249 Gross per 24 hour  Intake 895 ml  Output 2700 ml  Net -1805 ml   Last 3 Weights 05/14/2020 12/31/2019 12/15/2019  Weight (lbs) 223 lb 1.6 oz 190 lb 190 lb  Weight (kg) 101.197 kg 86.183 kg 86.183 kg  Some encounter information is confidential and restricted. Go to Review Flowsheets activity to see all data.     Body mass index is 34.94 kg/m.  General:  Well nourished, well developed, in no acute distress HEENT: normal Lymph: no adenopathy Neck: no JVD Endocrine:  No thryomegaly Vascular: No carotid bruits; FA pulses 2+ bilaterally without bruits   Cardiac:  normal S1, S2; RRR; 2/6 systolic murmur Lungs:  clear to auscultation bilaterally, no wheezing, rhonchi or rales  Abd: soft, nontender, no hepatomegaly  Ext: no edema Musculoskeletal:  No deformities, BUE and BLE strength normal and equal Skin: warm and dry  Neuro:  no focal abnormalities noted Psych:  Delusional   EKG:  The EKG was personally reviewed and demonstrates:  SR, HR 87 bpm, LVH Telemetry:  Telemetry was personally reviewed and demonstrates:  SR, PVCs, 4 beast of SVT  Relevant CV Studies:  Echo 05/14/2020 1. Left ventricular ejection fraction, by estimation, is 30 to 35%. The  left ventricle has moderately decreased function. The left ventricle  demonstrates global hypokinesis. There is severe asymmetric left  ventricular hypertrophy of the  posterior-lateral segment. Left ventricular diastolic parameters are  consistent with Grade III diastolic dysfunction (restrictive).  2. Right ventricular systolic function is normal. The right ventricular  size is normal. There is moderately elevated pulmonary artery systolic  pressure. The estimated right ventricular systolic pressure is 46.6 mmHg.  3. Left atrial size was severely dilated.  4. The mitral valve is grossly normal. Mild mitral valve regurgitation.  No evidence of mitral stenosis.  5. Tricuspid valve regurgitation is moderate.  6. The aortic valve is abnormal. There is severe calcifcation of the  aortic valve. Aortic valve regurgitation is trivial. Moderate aortic valve  stenosis. Aortic valve mean gradient measures 25.2 mmHg.  7. Aortic dilatation noted. There is borderline dilatation of the  ascending aorta, measuring 39 mm.  8. The inferior vena cava is dilated in size with <50% respiratory  variability, suggesting right atrial pressure of 15 mmHg.  9. Cannot exclude atrial level shunt suggested by color flow Doppler.   Echo 09/11/2018 1. The left ventricle has normal systolic function, with an  ejection  fraction of 55-60%. The cavity size was normal. There is moderately  increased left ventricular wall thickness. Left ventricular diastolic  function could not be evaluated due to  nondiagnostic images. No evidence of left ventricular regional wall motion  abnormalities.  2. The right ventricle has normal systolic function. The cavity was  normal. There is no increase in right ventricular wall thickness. Right  ventricular systolic pressure could not be assessed.  3. There is mild mitral annular calcification present.  4. The aortic valve is tricuspid. Severely thickening  of the aortic  valve. Severe calcifcation of the aortic valve. Mild-moderate stenosis of  the aortic valve.   Laboratory Data:  High Sensitivity Troponin:   Recent Labs  Lab 05/13/20 1210 05/13/20 1440  TROPONINIHS 35* 37*     Chemistry Recent Labs  Lab 05/13/20 1210 05/14/20 0350  NA 140 141  K 3.7 3.6  CL 111 108  CO2 23 26  GLUCOSE 105* 104*  BUN 28* 28*  CREATININE 1.27* 1.54*  CALCIUM 9.0 9.0  GFRNONAA 46* 37*  ANIONGAP 6 7    Recent Labs  Lab 05/14/20 0350  PROT 6.3*  ALBUMIN 3.2*  AST 20  ALT 22  ALKPHOS 68  BILITOT 0.8   Hematology Recent Labs  Lab 05/13/20 1210 05/14/20 0350  WBC 6.3 6.2  RBC 3.40* 3.29*  HGB 10.1* 9.9*  HCT 33.3* 31.8*  MCV 97.9 96.7  MCH 29.7 30.1  MCHC 30.3 31.1  RDW 17.0* 16.9*  PLT 313 309   BNP Recent Labs  Lab 05/13/20 1210  BNP 2,524.0*     Radiology/Studies:  DG Chest 2 View  Result Date: 05/13/2020 CLINICAL DATA:  Short of breath and chest pain EXAM: CHEST - 2 VIEW COMPARISON:  08/08/2019 FINDINGS: Cardiac enlargement. Mild vascular congestion without edema or effusion. Negative for pneumonia Right shoulder replacement. Disc degeneration and spurring throughout the thoracic spine. IMPRESSION: Cardiac enlargement with mild vascular congestion. Negative for edema or effusion. Electronically Signed   By: Marlan Palau M.D.   On:  05/13/2020 12:53   DG CHEST PORT 1 VIEW  Result Date: 05/14/2020 CLINICAL DATA:  Shortness of breath EXAM: PORTABLE CHEST 1 VIEW COMPARISON:  05/13/2020 radiograph, 04/18/2018 CT FINDINGS: Diffuse hazy and interstitial opacities throughout both lungs with cuffing, fissural and septal thickening and features of pulmonary vascular congestion. Cardiomegaly is again seen, slightly accentuated by the portable technique. The aorta is calcified. The remaining cardiomediastinal contours are unremarkable. Prior right shoulder arthroplasty. Severe degenerative changes in the left shoulder. Slightly high-riding appearance of both humeral heads likely reflecting underlying rotator cuff insufficiency. No acute osseous or soft tissue abnormality. Telemetry leads and external support devices overlie the chest. IMPRESSION: Features of CHF/volume overload including cardiomegaly and pulmonary vascular congestion. Hazy interstitial opacities suggest edema, particularly in the absence of infectious symptoms. Electronically Signed   By: Kreg Shropshire M.D.   On: 05/14/2020 02:31   ECHOCARDIOGRAM COMPLETE  Result Date: 05/14/2020    ECHOCARDIOGRAM REPORT   Patient Name:   Erin Riley [place]house Date of Exam: 05/14/2020 Medical Rec #:  [id number]   Height:       67.0 in Accession #:    [id number]  Weight:       190.0 lb Date of Birth:  [birth year]   BSA:          1.979 m Patient Age:    69 years    BP:           160/94 mmHg Patient Gender: F           HR:           64 bpm. Exam Location:  Inpatient Procedure: 2D Echo, Cardiac Doppler and Color Doppler Indications:    CHF                 Pulmonary hypertension                 Aortic stenosis  History:        Patient has prior history of  Echocardiogram examinations, most                 recent 09/11/2018. Signs/Symptoms:Shortness of Breath; Risk                 Factors:Hypertension, Dyslipidemia and Former Smoker. PAD. CKD.  Sonographer:    Ross LudwigArthur Guy RDCS (AE) Referring Phys: [id number] Heide ScalesHRISTOPHER J  TEGELER  Sonographer Comments: Image acquisition challenging due to respiratory motion. Image acquisition challeing due to patient movement. IMPRESSIONS  1. Left ventricular ejection fraction, by estimation, is 30 to 35%. The left ventricle has moderately decreased function. The left ventricle demonstrates global hypokinesis. There is severe asymmetric left ventricular hypertrophy of the posterior-lateral segment. Left ventricular diastolic parameters are consistent with Grade III diastolic dysfunction (restrictive).  2. Right ventricular systolic function is normal. The right ventricular size is normal. There is moderately elevated pulmonary artery systolic pressure. The estimated right ventricular systolic pressure is 46.6 mmHg.  3. Left atrial size was severely dilated.  4. The mitral valve is grossly normal. Mild mitral valve regurgitation. No evidence of mitral stenosis.  5. Tricuspid valve regurgitation is moderate.  6. The aortic valve is abnormal. There is severe calcifcation of the aortic valve. Aortic valve regurgitation is trivial. Moderate aortic valve stenosis. Aortic valve mean gradient measures 25.2 mmHg.  7. Aortic dilatation noted. There is borderline dilatation of the ascending aorta, measuring 39 mm.  8. The inferior vena cava is dilated in size with <50% respiratory variability, suggesting right atrial pressure of 15 mmHg.  9. Cannot exclude atrial level shunt suggested by color flow Doppler. FINDINGS  Left Ventricle: Left ventricular ejection fraction, by estimation, is 30 to 35%. The left ventricle has moderately decreased function. The left ventricle demonstrates global hypokinesis. 3D left ventricular ejection fraction analysis performed but not reported based on interpreter judgement due to suboptimal quality. The left ventricular internal cavity size was normal in size. There is severe asymmetric left ventricular hypertrophy of the posterior-lateral segment. Left ventricular diastolic  parameters are consistent with Grade III diastolic dysfunction (restrictive). Right Ventricle: The right ventricular size is normal. No increase in right ventricular wall thickness. Right ventricular systolic function is normal. There is moderately elevated pulmonary artery systolic pressure. The tricuspid regurgitant velocity is 2.81 m/s, and with an assumed right atrial pressure of 15 mmHg, the estimated right ventricular systolic pressure is 46.6 mmHg. Left Atrium: Left atrial size was severely dilated. Right Atrium: Right atrial size was normal in size. Pericardium: There is no evidence of pericardial effusion. Mitral Valve: The mitral valve is grossly normal. Mild mitral annular calcification. Mild mitral valve regurgitation. No evidence of mitral valve stenosis. MV peak gradient, 4.5 mmHg. The mean mitral valve gradient is 1.0 mmHg. Tricuspid Valve: The tricuspid valve is normal in structure. Tricuspid valve regurgitation is moderate . No evidence of tricuspid stenosis. Aortic Valve: The aortic valve is abnormal. There is severe calcifcation of the aortic valve. Aortic valve regurgitation is trivial. Aortic regurgitation PHT measures 471 msec. Moderate aortic stenosis is present. Aortic valve mean gradient measures 25.2  mmHg. Aortic valve peak gradient measures 48.0 mmHg. Aortic valve area, by VTI measures 1.26 cm. Pulmonic Valve: The pulmonic valve was normal in structure. Pulmonic valve regurgitation is trivial. No evidence of pulmonic stenosis. Aorta: Aortic dilatation noted. There is borderline dilatation of the ascending aorta, measuring 39 mm. Venous: The inferior vena cava is dilated in size with less than 50% respiratory variability, suggesting right atrial pressure of 15 mmHg. IAS/Shunts: Cannot exclude  atrial level shunt suggested by color flow Doppler.  LEFT VENTRICLE PLAX 2D LVIDd:         5.50 cm      Diastology LVIDs:         4.50 cm      LV e' medial:    2.61 cm/s LV PW:         1.80 cm       LV E/e' medial:  38.7 LV IVS:        1.30 cm      LV e' lateral:   3.30 cm/s LVOT diam:     2.20 cm      LV E/e' lateral: 30.6 LV SV:         93 LV SV Index:   47 LVOT Area:     3.80 cm                              3D Volume EF: LV Volumes (MOD)            3D EF:        45 % LV vol d, MOD A2C: 197.0 ml LV EDV:       282 ml LV vol d, MOD A4C: 157.0 ml LV ESV:       156 ml LV vol s, MOD A2C: 115.0 ml LV SV:        126 ml LV vol s, MOD A4C: 112.0 ml LV SV MOD A2C:     82.0 ml LV SV MOD A4C:     157.0 ml LV SV MOD BP:      65.2 ml RIGHT VENTRICLE            IVC RV Basal diam:  3.10 cm    IVC diam: 2.40 cm RV S prime:     7.83 cm/s TAPSE (M-mode): 1.9 cm LEFT ATRIUM              Index       RIGHT ATRIUM           Index LA diam:        4.60 cm  2.32 cm/m  RA Area:     17.40 cm LA Vol (A2C):   136.0 ml 68.73 ml/m RA Volume:   44.40 ml  22.44 ml/m LA Vol (A4C):   88.9 ml  44.93 ml/m LA Biplane Vol: 111.0 ml 56.10 ml/m  AORTIC VALVE AV Area (Vmax):    1.28 cm AV Area (Vmean):   1.23 cm AV Area (VTI):     1.26 cm AV Vmax:           346.50 cm/s AV Vmean:          230.000 cm/s AV VTI:            0.733 m AV Peak Grad:      48.0 mmHg AV Mean Grad:      25.2 mmHg LVOT Vmax:         117.00 cm/s LVOT Vmean:        74.700 cm/s LVOT VTI:          0.244 m LVOT/AV VTI ratio: 0.33 AI PHT:            471 msec  AORTA Ao Root diam: 3.10 cm Ao Asc diam:  3.90 cm MITRAL VALVE                 TRICUSPID VALVE MV  Area (PHT): 3.12 cm      TR Peak grad:   31.6 mmHg MV Area VTI:   2.53 cm      TR Vmax:        281.00 cm/s MV Peak grad:  4.5 mmHg MV Mean grad:  1.0 mmHg      SHUNTS MV Vmax:       1.06 m/s      Systemic VTI:  0.24 m MV Vmean:      52.6 cm/s     Systemic Diam: 2.20 cm MV Decel Time: 243 msec MR Peak grad:    101.2 mmHg MR Mean grad:    65.0 mmHg MR Vmax:         503.00 cm/s MR Vmean:        378.0 cm/s MR PISA:         1.01 cm MR PISA Eff ROA: 8 mm MR PISA Radius:  0.40 cm MV E velocity: 101.00 cm/s MV A velocity: 47.60  cm/s MV E/A ratio:  2.12 Erin Brass MD Electronically signed by Erin Brass MD Signature Date/Time: 05/14/2020/9:47:11 AM    Final      Assessment and Plan:   1. Chronic combined CHF -Echocardiogram today showed LV function of 30 to 35% (EF was 55-60% in 2020), grade 3 diastolic dysfunction, severe LVH, global hypokinesis, RSVP 46.6 mmHg, severely dilated left atrium, mild mitral regurgitation, moderate TR, moderate aortic stenosis with mean gradient 25.2 mmHg.  Dilated ascending aorta at 39 mm. -BNP ~1500. CXR with pulmonary edema -Got IV lasix 40mg  x 2 with bump in Scr 1.27>>1.54 -Suspect acute pulmonary edema secondary to hypertensive urgency with hypertensive cardiomyopathy -Not a candidate for invasive ischemic evaluation -Will review of guideline directed medical therapy with Dr. >> Start Coreg 12.5mg  BID then up titrate. If stable renal function>> increase losartan, if not add hydralazine for afterload reduction.   2.  Hypertensive urgency -Dose of IV hydralazine and IV labetalol -Currently only on losartan 25 mg -Most recent blood pressure with 161/88  3. Acute on CKD III - seems baseline Scr ~1.2 - Scr 1.54 today   4. Aortic stenosis - moderate aortic stenosis with mean gradient 25.2 mmHg by echo today   Risk Assessment/Risk Scores:    New York Heart Association (NYHA) Functional Class NYHA Class II        For questions or updates, please contact [facility] Please consult [email or web address] for contact info under    Anne Fu, PA  05/14/2020 2:01 PM   Personally seen and examined. Agree with above.   70 year old female with schizophrenia here with hypertensive urgency in the setting of medication noncompliance.  She is on several different antihypertensives at home but she has not been taking for several days.  An echocardiogram was performed that demonstrated an EF of 30 to 35% with severe LVH.  Currently she is resting  comfortably in bed laying on her left side.  Lungs appear clear heart regular rate and rhythm, 2/6 systolic murmur, minimal lower extremity edema.  Most recent blood pressure has been 1 60-1 50 systolic 88-96 diastolic  Creatinine 1.54, BNP 2500  Assessment and plan:  Acute systolic heart failure, likely nonischemic cardiomyopathy secondary to hypertension Hypertensive urgency Acute on chronic kidney disease stage IIIa Aortic stenosis moderate-mean gradient 25 mmHg  -We are going to add back carvedilol 12.5 mg twice a day.  Tomorrow increase to 25 mg twice a day back to previous dosing. Several medications listed above that  she has not been taking.  Recommend gently adding back medications as she tolerates. -After achieving improved blood pressure, consider repeating echocardiogram in several months.  If returned to normal, cardiomyopathy secondary to uncontrolled hypertension.  Donato Schultz, MD

## 2020-05-14 NOTE — Progress Notes (Signed)
HD#1 Subjective:  Overnight Events: no overnight events  Patient states that her symptoms have improved.  She feels at baseline for her shortness of breath and her lower extremity edema has improved.  She is comfortable on room air.  Otherwise, she continues to have concerns for delusions.  Otherwise no acute symptoms.  Objective:  Vital signs in last 24 hours: Vitals:   05/15/20 0000 05/15/20 0458 05/15/20 0500 05/15/20 0909  BP: (!) 149/94 139/89  (!) 160/82  Pulse: 65 70  63  Resp: 18 18  20   Temp:  98.2 F (36.8 C)  98.2 F (36.8 C)  TempSrc:  Oral    SpO2:  100%  97%  Weight:   103.9 kg   Height:       Supplemental O2: Room Air SpO2: 97 % O2 Flow Rate (L/min): 2 L/min   Physical Exam:  Physical Exam Constitutional:      General: She is not in acute distress.    Appearance: She is obese. She is not diaphoretic.  HENT:     Head: Normocephalic and atraumatic.  Eyes:     Extraocular Movements: Extraocular movements intact.  Neck:     Comments: JVD difficult to assess. Cardiovascular:     Pulses: Normal pulses.     Heart sounds: Murmur (AS murmur at the right upper sternal boarder. ) heard.    Pulmonary:     Effort: Pulmonary effort is normal.     Breath sounds: Normal breath sounds. No wheezing or rales.  Abdominal:     General: There is no distension.     Palpations: Abdomen is soft.     Tenderness: There is no abdominal tenderness.  Musculoskeletal:        General: Swelling (1+ LE edema bialterally to her knees.) present. Normal range of motion.     Cervical back: Normal range of motion.  Skin:    General: Skin is warm and dry.  Neurological:     General: No focal deficit present.     Mental Status: She is alert and oriented to person, place, and time.  Psychiatric:        Mood and Affect: Mood is anxious.        Thought Content: Thought content is delusional.      Filed Weights   05/14/20 1208 05/15/20 0500  Weight: 101.2 kg 103.9 kg      Intake/Output Summary (Last 24 hours) at 05/15/2020 1322 Last data filed at 05/15/2020 0900 Gross per 24 hour  Intake 1075 ml  Output 600 ml  Net 475 ml   Net IO Since Admission: -1,110 mL [05/15/20 1322]  Pertinent Labs: CBC Latest Ref Rng & Units 05/15/2020 05/14/2020 05/13/2020  WBC 4.0 - 10.5 K/uL 6.7 6.2 6.3  Hemoglobin 12.0 - 15.0 g/dL 07/13/2020) 3.1(S) 10.1(L)  Hematocrit 36.0 - 46.0 % 31.2(L) 31.8(L) 33.3(L)  Platelets 150 - 400 K/uL 283 309 313    CMP Latest Ref Rng & Units 05/15/2020 05/14/2020 05/13/2020  Glucose 70 - 99 mg/dL 07/13/2020) 263(Z) 858(I)  BUN 8 - 23 mg/dL 502(D) 74(J) 28(N)  Creatinine 0.44 - 1.00 mg/dL 86(V) 6.72(C) 9.47(S)  Sodium 135 - 145 mmol/L 141 141 140  Potassium 3.5 - 5.1 mmol/L 3.8 3.6 3.7  Chloride 98 - 111 mmol/L 107 108 111  CO2 22 - 32 mmol/L 28 26 23   Calcium 8.9 - 10.3 mg/dL 9.62(E) 9.0 9.0  Total Protein 6.5 - 8.1 g/dL - 6.3(L) -  Total Bilirubin 0.3 -  1.2 mg/dL - 0.8 -  Alkaline Phos 38 - 126 U/L - 68 -  AST 15 - 41 U/L - 20 -  ALT 0 - 44 U/L - 22 -    Imaging: DG Chest 2 View Cardiac enlargement with mild vascular congestion. Negative for edema or effusion.  DG CHEST PORT 1 VIEW Features of CHF/volume overload including cardiomegaly and pulmonary vascular congestion. Hazy interstitial opacities suggest edema, particularly in the absence of infectious symptoms.   ECHOCARDIOGRAM COMPLETE 1. Left ventricular ejection fraction, by estimation, is 30 to 35%. The left ventricle has moderately decreased function. The left ventricle demonstrates global hypokinesis. There is severe asymmetric left ventricular hypertrophy of the posterior-lateral segment. Left ventricular diastolic parameters are consistent with Grade III diastolic dysfunction (restrictive).   2. Right ventricular systolic function is normal. The right ventricular size is normal. There is moderately elevated pulmonary artery systolic pressure. The estimated right ventricular  systolic pressure is 46.6 mmHg.  3. Left atrial size was severely dilated.   4. The mitral valve is grossly normal. Mild mitral valve regurgitation. No evidence of mitral stenosis.  5. Tricuspid valve regurgitation is moderate.   6. The aortic valve is abnormal. There is severe calcifcation of the aortic valve. Aortic valve regurgitation is trivial. Moderate aortic valve stenosis. Aortic valve mean gradient measures 25.2 mmHg.   7. Aortic dilatation noted. There is borderline dilatation of the ascending aorta, measuring 39 mm.  8. The inferior vena cava is dilated in size with <50% respiratory variability, suggesting right atrial pressure of 15 mmHg.   9. Cannot exclude atrial level shunt suggested by color flow Doppler.    Assessment/Plan:   Active Problems:   Severe uncontrolled hypertension   Acute heart failure (HCC)   Elevated troponin   SOB (shortness of breath)   Severe, Symptomatic HTN Patient continues to have elevated blood pressure at 160/82 on Losartan 25 mg and carvedilol 12.5 mg BID. Tolerating it well.  - Appreciate cardiologies assistance with this patient. - Start hydralazine 50 mg TID and Imdur 30 mg daily. - Increase Coreg to 25 mg BID and Losartan to 50 mg daily - Will continue to assess for response to therapy.   - Goal: normotensive.    Acute Systolic Heart Failure Patient had a net 1.1 liter urine output overnight. She is breathing better and her lower extremity edema has improved.  - Increase Lasix to 80 mg IV BID - Continue Losartan 50 mg daily - Continue Coreg 25 mg BID - Imdur 30 mg daily.  - Strict I&O and daily weights  - Fluid and salt restricted diet  Moderate AS, TR, and mild MR Aortic valve mean gradient 25.2 mmHg today.  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 Patient saturating >90% on RA.  AKI on CKD III Cr today at 1.43 from 1.54 yesterday.  GFR  of 40.  Continues to have good urine output  -Continue to monitor renal function daily -Replete electrolytes as needed -Nephrotoxic medications when possible   Paranoid Schizophrenia  Patient tolerating olanzapine well.  She is on olanzapine 5 mg twice daily.  She continues to have delusions but symptoms seem to be improving we will plan for titrating this medication up to 5days to 5 mg in the morning and 10 mg at night. -Appreciate psychiatry's assistance. -Continue olanzapine 5 mg twice daily -Cruciate TSE's assistance with housing as she will likely need placement in a  group home versus nursing facility   Acute Normocytic Anemia, iron deficiency  -Continue p.o. ferrous gluconate daily -Monitor hemoglobin daily   Prior to Admission Living Arrangement: Hotel, Unstable Housing Situation Anticipated Discharge Location: Group home or ACT team Barriers to Discharge: Continued Medical Therapy   Dispo: Anticipated discharge to Pending in 2 to 3 days days pending improvement of her heart failure and control of her schizophrenia.   Chari Manning, D.O.  Internal Medicine Resident, PGY-2 [name]  Pager: [phone] 1:22 PM, 05/15/2020     Please contact the on call pager after 5 pm and on weekends at [phone].

## 2020-05-14 NOTE — Progress Notes (Addendum)
Paged for elevated BP by RN. Patient was sleep but woke up due to leg cramps and complaining of dyspnea. Patient somnolent on exam state she feels worsening SOB but denies chest pain. She was PRN labetalol at 2257 when transferred to floor but BP remains elevated. RN states she has be urinating well but has been using the bathroom and unable to measure.   Vitals:   05/13/20 2139 05/14/20 0131  BP: (!) 180/116 (!) 195/132  Pulse:  71  Resp: (!) 22 (!) 22  Temp: (!) 97.4 F (36.3 C) 97.6 F (36.4 C)  SpO2: 97% 95%   General: Somnolent on exam in no acute distress Respiratory: tachypnea when awake, but improved when sleeping. Lungs are clear to ascultation bilaterally. Saturating in high 90s on 2L O2, no wheezing or crackles  Cardiovascular: Regular rate and rhythm. holosystolic murmur. Trace pretibial edema bilaterally   Plan Patient continues to have elevated pressures. Documented urine output of 700 mL likely less than actual output. Likely she is diuresing adequately. Concern for flash pulmonary edema given worsened dyspnea in setting of heart failure and severe hypertension. Will order additional 5 mg IV labetalol and restart hydralazine 50 mg three times daily. ABG ordered will order BiPAP if she retaining CO2.

## 2020-05-14 NOTE — Evaluation (Signed)
Physical Therapy Evaluation Patient Details Name: Erin Riley MRN: [id number] DOB: 1950/09/16 Today's Date: 05/14/2020   History of Present Illness  Erin Riley is a 70 y.o. female admitted for  evaluation of CHF.  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  Clinical Impression  Pt admitted with above diagnosis. Pt reported needing to use the 3N1. Moved impulsively and DOE 4/4 after just gettting to 3N1, cleaning herself and moving back to bed.  O2 sats 93% on RA.  Pt reports fatigue and she did not want to ambulate today.  Pt reports that that is what brought her into the hospital.  She is adamant that she wants to go home at D/C.  Recommend HHPT and rollator for safety if she wont agree to get therapy. Pt currently with functional limitations due to the deficits listed below (see PT Problem List). Pt will benefit from skilled PT to increase their independence and safety with mobility to allow discharge to the venue listed below.      Follow Up Recommendations Home health PT;Supervision - Intermittent    Equipment Recommendations  Other (comment) (rollator)    Recommendations for Other Services       Precautions / Restrictions Precautions Precautions: Fall Restrictions Weight Bearing Restrictions: No      Mobility  Bed Mobility Overal bed mobility: Independent                  Transfers Overall transfer level: Needs assistance Equipment used: None Transfers: Sit to/from Stand;Stand Pivot Transfers Sit to Stand: Supervision Stand pivot transfers: Min guard       General transfer comment: min guard for safety as pt moves impulsively to commode and back. Also DOE 4/4 once pt cleaned herself of BM and urine.  O2 sat 93% on RA.  Ambulation/Gait             General Gait Details: NT  Stairs             Wheelchair Mobility    Modified Rankin (Stroke Patients Only)       Balance Overall balance assessment: Needs assistance Sitting-balance support: No upper extremity supported;Feet supported Sitting balance-Leahy Scale: Fair     Standing balance support: Bilateral upper extremity supported;No upper extremity supported;During functional activity Standing balance-Leahy Scale: Fair Standing balance comment: can stand statically without UE support to clean self but gets very SOB                             Pertinent Vitals/Pain Pain Assessment: Faces Faces Pain Scale: Hurts even more Pain Location: feet and legs Pain Descriptors / Indicators: Aching;Discomfort Pain Intervention(s): Limited activity within patient's tolerance;Monitored during session;Repositioned    Home Living Family/patient expects to be discharged to:: Private residence Living Arrangements: Alone   Type of Home: Other(Comment) (motel) Home Access: Level entry     Home Layout: One level Home Equipment: None Additional Comments: catches bus to travel    Prior Function Level of Independence: Independent               Hand Dominance   Dominant Hand: Right    Extremity/Trunk Assessment   Upper Extremity Assessment Upper Extremity Assessment: Defer to OT evaluation    Lower Extremity Assessment Lower Extremity Assessment: Overall WFL for tasks assessed    Cervical / Trunk Assessment  Cervical / Trunk Assessment: Normal  Communication   Communication: No difficulties  Cognition Arousal/Alertness: Awake/alert Behavior During Therapy: WFL for tasks assessed/performed Overall Cognitive Status: Within Functional Limits for tasks assessed                                 General Comments: Appears to have some decr safety awareness as she moved impulsively to 3n1      General Comments      Exercises     Assessment/Plan    PT Assessment Patient needs  continued PT services  PT Problem List Decreased activity tolerance;Decreased balance;Decreased mobility;Decreased knowledge of use of DME;Decreased safety awareness;Decreased knowledge of precautions;Cardiopulmonary status limiting activity;Pain       PT Treatment Interventions DME instruction;Gait training;Functional mobility training;Therapeutic activities;Therapeutic exercise;Balance training;Patient/family education;Stair training    PT Goals (Current goals can be found in the Care Plan section)  Acute Rehab PT Goals Patient Stated Goal: to go home PT Goal Formulation: With patient Time For Goal Achievement: 05/28/20 Potential to Achieve Goals: Good    Frequency Min 3X/week   Barriers to discharge Decreased caregiver support      Co-evaluation               AM-PAC PT "6 Clicks" Mobility  Outcome Measure Help needed turning from your back to your side while in a flat bed without using bedrails?: None Help needed moving from lying on your back to sitting on the side of a flat bed without using bedrails?: None Help needed moving to and from a bed to a chair (including a wheelchair)?: A Little Help needed standing up from a chair using your arms (e.g., wheelchair or bedside chair)?: A Little Help needed to walk in hospital room?: A Little Help needed climbing 3-5 steps with a railing? : A Little 6 Click Score: 20    End of Session Equipment Utilized During Treatment: Gait belt Activity Tolerance: Patient limited by fatigue Patient left: in bed;with call bell/phone within reach;with bed alarm set Nurse Communication: Mobility status PT Visit Diagnosis: Unsteadiness on feet (R26.81);Muscle weakness (generalized) (M62.81)    Time: 1740-8144 PT Time Calculation (min) (ACUTE ONLY): 13 min   Charges:   PT Evaluation $PT Eval Moderate Complexity: 1 Mod          Elbie Statzer M,PT Acute Rehab Services [phone] (pager)  Bevelyn Buckles 05/14/2020, 3:44  PM

## 2020-05-14 NOTE — Progress Notes (Addendum)
Subjective:   Overnight, IMTS was notified by RN that patient's blood pressure was elevated to 195/132 and that she woke up secondary to leg cramps and was dyspneic. She was started on oxygen and given a single dose of Labetalol 5mg  x 1 and hydralazine, with improvement in pressures. Accurate outputs weren't able to be obtained as patient refused collection device. This morning, she states that she is feeling well this morning. She denies any chest pain or pressure, palpitations, light-headedness, shortness of breath, cough, or nausea. She has not had any recurrent episodes of vomiting or post-tussive emesis. States she is currently living in a hotel.   Objective:  Vital signs in last 24 hours: Vitals:   05/14/20 0307 05/14/20 0521 05/14/20 0935 05/14/20 1208  BP: (!) 152/96 (!) 160/94 (!) 161/88   Pulse:  64 64   Resp: 20 20 19    Temp:  (!) 97.4 F (36.3 C) 98 F (36.7 C)   TempSrc:  Oral Oral   SpO2:  97% 97%   Weight:    101.2 kg  Height:    5\' 7"  (1.702 m)   General: Patient appears tired but comfortable, in no acute distress HENT: Dentures in place. No nasal discharge. Respiratory: Lungs sounds decreased at the bases bilaterally although otherwise CTA without wheezes, rales, or rhonchi.  Cardiovascular: RRR. There is a 4/6 SEM heard in the right upper chest. There is a 3/6 blowing holosystolic murmur along the left lower sternal border and 2/6 murmur present in the right lower chest. S3 present. LE edema improved, now 1+ bilaterally. JVD to the jaw line present. Neurological: Patient oriented to self and place.  Abdominal: Soft and non-tender to palpation. No rebound or guarding. Skin: No lesions. No rashes.  Psych: Thoughts are disoriented. Speech is normal.   Assessment/Plan:  Active Problems:   Severe uncontrolled hypertension  Hypertensive Urgency  Blood pressures have improved from admission to 161/88 this morning after having received single dose of IV Labetalol and  hydralazine overnight. She was started on Losartan 25mg  daily.   - Continue Losartan 25mg  daily  - Start carvedilol 12.5mg  twice daily  - Plan to add back additional agents tomorrow as pressures allow   Acute Systolic Heart Failure Patient was started on 2-3L [place] oxygen overnight, although continues to maintain oxygen saturations in the mid-90's, resting comfortably without subjective SOB. LE edema has improved significantly with diuresis. TTE shows LVEF 30-35% with global hypokinesis, normal RV size and function, severe asymmetric LVH of the posterior-lateral segment, and grade III restrictive diastolic dysfunction with severe LA dilation, most likely in the setting of chronic hypertension. IVC is dilated with decreased respiratory variation consistent with volume overload. Given second dose of Lasix 40mg  IV this morning. Cardiology consulted, who believe exacerbation is likely due to NICM 2/2 uncontrolled HTN. Heart Failure Navigator on board who states patient unfortunately does not meet criteria for Heart & Vascular TOC clinic secondary to active paranoid delusions and history of non-compliance.   - Start carvedilol 12.5mg  twice daily  - Increase carvedilol to 25mg  twice daily tomorrow (home dose)  - Labetalol 5mg  IV PRN for pressures >180/110 mmHg  - Gently add back GDMT as tolerated  - Would benefit from repeat ECHO in several months after pressures normalized to determine whether underlying cause is indeed uncontrolled HTN - Strict I&O and daily weights   Moderate AS, TR, and mild MR Aortic valve mean gradient 25.2 mmHg today.  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 Likely due to pulmonary edema. ABG showed pH 7.337, pCO2 51.2, bicarb 26.7 consistent with chronic respiratory acidosis. Likely 2/2 OHS and volume overload.  -  Continue [place] oxygen for goal of SpO2 >94%  - If develops worsening hypoxia especially overnight, consider BIPAP given hypercarbia  AKI on CKD III Creatinine slightly elevated above baseline creatinine ~1.2. Likely cardiorenal in setting of volume overload.   - Continue to monitor daily renal function   Paranoid Schizophrenia  Patient had been on olanzapine in the past although had not been taking her medications consistently PTA. Patient continues to experience disorganized thoughts with paranoid delusions complicated by medication non-compliance and unstable housing situation.   - Greatly appreciate psychiatry's assistance  - Psychiatry signed off; patient does not meet in-patient criteria for psychiatric inpatient admission - Zyprexa 5mg  BID  - Consult placed for [facility] for assistance with possible group home vs. Nursing home placement and insurance approval prior to discharge - Psychiatry signed off  Acute Normocytic Anemia Hemoglobin remains low but stable. No signs of bleeding. Iron saturation slightly low at 14 with iron 48, ferritin 86 and TIBC 340.   - Started PO ferrous gluconate daily - Continue to monitor CBC  Prior to Admission Living Arrangement: Hotel, Unstable Housing Situation Anticipated Discharge Location: Group home or ACT team Barriers to Discharge: Continued Medical Therapy  [facility], MD 05/14/2020, 3:34 PM Pager: [phone] After 5pm on weekdays and 1pm on weekends: On Call pager [phone]

## 2020-05-14 NOTE — Consult Note (Signed)
[facility] Face to Face Psych Consultation   Reason for Consult: Ms. Leder has a history of schizophrenia, currently with paranoid delusions in the setting of not taking home medication. Per chart, had been taking olanzapine although we do not have records from her PCP she says she follows with. Does have mild tardive dyskinesia on exam. Restarted olanzapine low dose although wondering if there are better alternatives. Has unstable housing situation, living in hotel currently.  Referring Physician:  Dr. Debe Coder Patient Identification: Erin Riley MRN:  [id number] Principal Diagnosis: <principal problem not specified> Diagnosis:  Active Problems:   Severe uncontrolled hypertension   Total Time spent with patient: 30 minutes  Subjective:   Erin Riley is a 70 y.o. female patient admitted with CHF exacerbation, and uncontrolled hypertension.Erin Riley  HPI:  Ms. Waitman is a 75 obese y.o. lady w/ PMHx hypertensive hypertrophic cardiomyopathy, aortic stenosis, subclavian artery stenosis, PAD, CKD stage 3, HTN, HLD, schizophrenia, and lumbar spinal fusion who presented to the ED due to worsening shortness of breath and bilateral lower extremity swelling over the past week that acutely worsened over the past 3 days. Her breathing is worse laying down and she endorses associated productive cough with beige, frothy phlegm, nausea, vomiting (although most likely post-tussive), and abdominal swelling. She denies any CP, palpitations, light-headedness, dizziness, weakness or numbness. Notes previous burning pain in her umbilicus and lower back pain, although both have resolved. Denies any history of MI or cardiac procedures. States she has not been taking any of her medications as she "doesn't need them". States she receives her care from [facility] and was supposed to have her medications refilled 2 days ago but did not receive them.   Per chart review patient was admitted for worsening shortness of breath and  bilateral lower extremity swelling over the past week.  On admission her BNP was greater than 2500, with elevated troponin.  Psychiatric consult was placed for history of schizophrenia, exhibiting paranoid delusions, not taking home medication.  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  Per chart review she is actively experiencing delusions related to her sister being in jail, and stealing her money.   On interview, patient appears to be resting.  She has very difficult to arouse, and is minimally responsive throughout the evaluation.  Patient states she is taking her medication to include olanzapine.  She was unable to elaborate much more related to her drowsiness.  She denies suicidal ideations, homicidal ideations, and or auditory visual hallucinations.  Patient is a poor historian, as she is noted to be disorganized in her thought process.  Past Psychiatric History: Schizophrenia   Risk to Self:  None. Denies SI.  Risk to Others:  Delusional.  Prior Inpatient Therapy:  Multiple inpatient hospitalizations, multiple emergency room visits for psychosis Prior Outpatient Therapy:  She is followed at [facility].   Past Medical History:  Past Medical History:  Diagnosis Date  . Chronic back pain   . Chronic kidney disease (CKD), stage II (mild)    Class I-II  . Coronary artery disease 04/2009   50% stenosis in the perforator of LAD; catheterization was for an abnormal Myoview in January 2000 showing anterior and inferolateral ischemia.  . Diverticulitis   . 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 > 3 subsequent Echos show improved/normal EF with moderate to severe concentric  LVH and diastolic dysfunction with LVOT/intracavitary gradient --> 06/2016: Severe LVH.  Vigorous EF, 65-70%.?? Gr 1 DD. Mild AS.   Erin Riley Hyperlipidemia   . Hypertension   . Hypertensive hypertrophic cardiomyopathy: NYHA class II:  Echo: Severe concentric LVH with LV OT gradient; essentially preserved EF with diastolic dysfunction 02/15/2013   Echo 06/2016: Severe Concentric LVH. Vigorous EF 65-70%. ~ Gr I DD.   . Mild aortic stenosis by prior echocardiography    Echo 06/2016: Mild AS (Mean Gradient 15 mmHg); has had prior Mod-Severe MR (not seen on current echo)  . PAD (peripheral artery disease) [facility]) March 2013   Lower extremity Dopplers: R. SFA 50-60%, R. PTA proximally occluded with distal reconstitution;; L. common iliac ~50%, L. SFA 50-70% stenosis, L. PTA < 50%  . Schizophrenia [facility])     Past Surgical History:  Procedure Laterality Date  . BUNIONECTOMY    . carotid doppler  05/29/2011   left bulb/prox ICA moderate amtfibrous plaque with no evidence significant reduction.,right bulb /proximal ICA normal patency  . lower extremity doppler  05/29/2011   right SFA 50% to 59% diameter reduction,right posterior tibal atreery occlusive disease,reconstituting distally, left common illiac<50%,left SFA 50 to70%,left post. tibial <50%  . NM MYOCAR PERF WALL MOTION  03/2009   Persantine; EF 51%-both anterior and inferolateral ischemia  . TRANSTHORACIC ECHOCARDIOGRAM  06/2016   Severe LVH.  Vigorous EF of 65-70%.  No RWMA. ~Only grade 1 diastolic dysfunction.  Mild aortic stenosis (mean gradient 15 mmHg)  . TRANSTHORACIC ECHOCARDIOGRAM  07/2012   EF 50-55%; severe concentric LVH; only grade 1 diastolic dysfunction. Mild aortic sclerosis - with LVOT /intracavitary gradient of roughly 20 mmHg mean. Mild to moderately dilated LA;; previously reported MR not seen   Family History:  Family History  Problem Relation Age of Onset  . Hypertension Mother   . Breast cancer Neg Hx    Family Psychiatric  History: Sister-marijuana  Social History:  Social History   Substance and Sexual Activity  Alcohol Use No     Social History    Substance and Sexual Activity  Drug Use No    Social History   Socioeconomic History  . Marital status: Single    Spouse name: Not on file  . Number of children: 7  . Years of education: Not on file  . Highest education level: Not on file  Occupational History  . Not on file  Tobacco Use  . Smoking status: Former Smoker    Types: Cigarettes    Quit date: 05/14/2002    Years since quitting: 18.0  . Smokeless tobacco: Never Used  Vaping Use  . Vaping Use: Never used  Substance and Sexual Activity  . Alcohol use: No  . Drug use: No  . Sexual activity: Yes    Birth control/protection: Post-menopausal  Other Topics Concern  . Not on file  Social History Narrative   Now single mother of 2 with one grandchild. She quit smoking roughly 5 years ago and is not so since. She has also stopped drinking alcohol. She does try get routine exercise walking at least a mile 3-4 days a week.    She lives with her 44 year old mother. 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   Social Determinants of Health   Financial Resource Strain: Not on file  Food Insecurity: Not on file  Transportation Needs: Not on file  Physical Activity: Not on file  Stress: Not on file  Social Connections: Not on file   Additional Social  History: She lives with her son, Denzil. She denies alcohol or illicit substance use.     Allergies:  No Known Allergies  Labs:  Results for orders placed or performed during the hospital encounter of 05/13/20 (from the past 48 hour(s))  Basic metabolic panel     Status: Abnormal   Collection Time: 05/13/20 12:10 PM  Result Value Ref Range   Sodium 140 135 - 145 mmol/L   Potassium 3.7 3.5 - 5.1 mmol/L   Chloride 111 98 - 111 mmol/L   CO2 23 22 - 32 mmol/L   Glucose, Bld 105 (H) 70 - 99 mg/dL    Comment: Glucose reference range applies only to samples taken after fasting for at least 8 hours.   BUN 28 (H) 8 - 23 mg/dL   Creatinine, Ser 1.61 (H) 0.44 - 1.00 mg/dL   Calcium  9.0 8.9 - 09.6 mg/dL   GFR, Estimated 46 (L) >60 mL/min    Comment: (NOTE) Calculated using the CKD-EPI Creatinine Equation (2021)    Anion gap 6 5 - 15    Comment: Performed at [facility], [address], [place], [place] [address]  CBC     Status: Abnormal   Collection Time: 05/13/20 12:10 PM  Result Value Ref Range   WBC 6.3 4.0 - 10.5 K/uL   RBC 3.40 (L) 3.87 - 5.11 MIL/uL   Hemoglobin 10.1 (L) 12.0 - 15.0 g/dL   HCT 98.1 (L) 19.1 - 47.8 %   MCV 97.9 80.0 - 100.0 fL   MCH 29.7 26.0 - 34.0 pg   MCHC 30.3 30.0 - 36.0 g/dL   RDW 29.5 (H) 62.1 - 30.8 %   Platelets 313 150 - 400 K/uL   nRBC 0.3 (H) 0.0 - 0.2 %    Comment: Performed at [facility], [address] [place], [place] [address]  Troponin I (High Sensitivity)     Status: Abnormal   Collection Time: 05/13/20 12:10 PM  Result Value Ref Range   Troponin I (High Sensitivity) 35 (H) <18 ng/L    Comment: (NOTE) Elevated high sensitivity troponin I (hsTnI) values and significant  changes across serial measurements may suggest ACS but many other  chronic and acute conditions are known to elevate hsTnI results.  Refer to the "Links" section for chest pain algorithms and additional  guidance. Performed at [facility], [address], [place], [place] [address]   Brain natriuretic peptide     Status: Abnormal   Collection Time: 05/13/20 12:10 PM  Result Value Ref Range   B Natriuretic Peptide 2,524.0 (H) 0.0 - 100.0 pg/mL    Comment: Performed at [facility], [address], [place], [place] [address]  Resp Panel by RT-PCR (Flu A&B, Covid) Nasopharyngeal Swab     Status: None   Collection Time: 05/13/20  1:08 PM   Specimen: Nasopharyngeal Swab; Nasopharyngeal(NP) swabs in vial transport medium  Result Value Ref Range   SARS Coronavirus 2 by RT PCR NEGATIVE NEGATIVE    Comment: (NOTE) SARS-CoV-2 target nucleic acids are NOT DETECTED.  The SARS-CoV-2 RNA is generally detectable in upper  respiratory specimens during the acute phase of infection. The lowest concentration of SARS-CoV-2 viral copies this assay can detect is 138 copies/mL. A negative result does not preclude SARS-Cov-2 infection and should not be used as the sole basis for treatment or other patient management decisions. A negative result may occur with  improper specimen collection/handling, submission of specimen  other than nasopharyngeal swab, presence of viral mutation(s) within the areas targeted by this assay, and inadequate number of viral copies(<138 copies/mL). A negative result must be combined with clinical observations, patient history, and epidemiological information. The expected result is Negative.  Fact Sheet for Patients:  BloggerCourse.com  Fact Sheet for Healthcare Providers:  SeriousBroker.it  This test is no t yet approved or cleared by the [place] FDA and  has been authorized for detection and/or diagnosis of SARS-CoV-2 by FDA under an Emergency Use Authorization (EUA). This EUA will remain  in effect (meaning this test can be used) for the duration of the COVID-19 declaration under Section 564(b)(1) of the Act, 21 U.S.C.section 360bbb-3(b)(1), unless the authorization is terminated  or revoked sooner.       Influenza A by PCR NEGATIVE NEGATIVE   Influenza B by PCR NEGATIVE NEGATIVE    Comment: (NOTE) The Xpert Xpress SARS-CoV-2/FLU/RSV plus assay is intended as an aid in the diagnosis of influenza from Nasopharyngeal swab specimens and should not be used as a sole basis for treatment. Nasal washings and aspirates are unacceptable for Xpert Xpress SARS-CoV-2/FLU/RSV testing.  Fact Sheet for Patients: BloggerCourse.com  Fact Sheet for Healthcare Providers: SeriousBroker.it  This test is not yet approved or cleared by the [place] FDA and has been authorized for  detection and/or diagnosis of SARS-CoV-2 by FDA under an Emergency Use Authorization (EUA). This EUA will remain in effect (meaning this test can be used) for the duration of the COVID-19 declaration under Section 564(b)(1) of the Act, 21 U.S.C. section 360bbb-3(b)(1), unless the authorization is terminated or revoked.  Performed at [facility], [address], [place], [place] [address]   Troponin I (High Sensitivity)     Status: Abnormal   Collection Time: 05/13/20  2:40 PM  Result Value Ref Range   Troponin I (High Sensitivity) 37 (H) <18 ng/L    Comment: (NOTE) Elevated high sensitivity troponin I (hsTnI) values and significant  changes across serial measurements may suggest ACS but many other  chronic and acute conditions are known to elevate hsTnI results.  Refer to the "Links" section for chest pain algorithms and additional  guidance. Performed at [facility], [address], [place], [place] [address]   Urine rapid drug screen (hosp performed)     Status: None   Collection Time: 05/13/20  5:45 PM  Result Value Ref Range   Opiates NONE DETECTED NONE DETECTED   Cocaine NONE DETECTED NONE DETECTED   Benzodiazepines NONE DETECTED NONE DETECTED   Amphetamines NONE DETECTED NONE DETECTED   Tetrahydrocannabinol NONE DETECTED NONE DETECTED   Barbiturates NONE DETECTED NONE DETECTED    Comment: (NOTE) DRUG SCREEN FOR MEDICAL PURPOSES ONLY.  IF CONFIRMATION IS NEEDED FOR ANY PURPOSE, NOTIFY LAB WITHIN 5 DAYS.  LOWEST DETECTABLE LIMITS FOR URINE DRUG SCREEN Drug Class                     Cutoff (ng/mL) Amphetamine and metabolites    1000 Barbiturate and metabolites    200 Benzodiazepine                 200 Tricyclics and metabolites     300 Opiates and metabolites        300 Cocaine and metabolites        300 THC  50 Performed at [facility], [address], [place], [place] [address]   TSH     Status: None   Collection  Time: 05/13/20  8:06 PM  Result Value Ref Range   TSH 1.059 0.350 - 4.500 uIU/mL    Comment: Performed by a 3rd Generation assay with a functional sensitivity of <=0.01 uIU/mL. Performed at [facility], [address] [place], [place] [address]   HIV Antibody (routine testing w rflx)     Status: None   Collection Time: 05/13/20  8:06 PM  Result Value Ref Range   HIV Screen 4th Generation wRfx Non Reactive Non Reactive    Comment: Performed at [facility], [address], [place], [place] [address]  Magnesium     Status: None   Collection Time: 05/13/20  8:06 PM  Result Value Ref Range   Magnesium 2.0 1.7 - 2.4 mg/dL    Comment: Performed at [facility], [address] [place], [place] [address]  Phosphorus     Status: None   Collection Time: 05/13/20  8:06 PM  Result Value Ref Range   Phosphorus 3.0 2.5 - 4.6 mg/dL    Comment: Performed at [facility], [address], [place], [place] [address]  Hemoglobin A1c     Status: None   Collection Time: 05/13/20  8:06 PM  Result Value Ref Range   Hgb A1c MFr Bld 5.2 4.8 - 5.6 %    Comment: (NOTE) Pre diabetes:          5.7%-6.4%  Diabetes:              >6.4%  Glycemic control for   <7.0% adults with diabetes    Mean Plasma Glucose 102.54 mg/dL    Comment: Performed at [facility], [address] [place], [place] [address]  Ferritin     Status: None   Collection Time: 05/13/20  8:06 PM  Result Value Ref Range   Ferritin 86 11 - 307 ng/mL    Comment: Performed at [facility], [address] [place], [place] [address]  Iron and TIBC     Status: None   Collection Time: 05/13/20  8:06 PM  Result Value Ref Range   Iron 48 28 - 170 ug/dL   TIBC 962 952 - 841 ug/dL   Saturation Ratios 14 10.4 - 31.8 %   UIBC 292 ug/dL    Comment: Performed at [facility], [address], [place], [place] [address]  Blood gas, arterial     Status: Abnormal   Collection Time: 05/14/20  3:41 AM   Result Value Ref Range   FIO2 28.00    pH, Arterial 7.337 (L) 7.350 - 7.450   pCO2 arterial 51.2 (H) 32.0 - 48.0 mmHg   pO2, Arterial 105 83.0 - 108.0 mmHg   Bicarbonate 26.7 20.0 - 28.0 mmol/L   Acid-Base Excess 1.5 0.0 - 2.0 mmol/L   O2 Saturation 98.2 %   Patient temperature 37.0    Collection site RIGHT RADIAL    Drawn by [phone]    Sample type ARTERIAL    Allens test (pass/fail) PASS PASS    Comment: Performed at [facility], [address], [place], [place] [address]  CBC     Status: Abnormal   Collection Time: 05/14/20  3:50 AM  Result Value Ref Range   WBC 6.2 4.0 - 10.5 K/uL   RBC  3.29 (L) 3.87 - 5.11 MIL/uL   Hemoglobin 9.9 (L) 12.0 - 15.0 g/dL   HCT 72.0 (L) 94.7 - 09.6 %   MCV 96.7 80.0 - 100.0 fL   MCH 30.1 26.0 - 34.0 pg   MCHC 31.1 30.0 - 36.0 g/dL   RDW 28.3 (H) 66.2 - 94.7 %   Platelets 309 150 - 400 K/uL   nRBC 0.0 0.0 - 0.2 %    Comment: Performed at [facility], [address], [place], [place] [address]  Comprehensive metabolic panel     Status: Abnormal   Collection Time: 05/14/20  3:50 AM  Result Value Ref Range   Sodium 141 135 - 145 mmol/L   Potassium 3.6 3.5 - 5.1 mmol/L   Chloride 108 98 - 111 mmol/L   CO2 26 22 - 32 mmol/L   Glucose, Bld 104 (H) 70 - 99 mg/dL    Comment: Glucose reference range applies only to samples taken after fasting for at least 8 hours.   BUN 28 (H) 8 - 23 mg/dL   Creatinine, Ser 0.35 (H) 0.44 - 1.00 mg/dL   Calcium 9.0 8.9 - 46.5 mg/dL   Total Protein 6.3 (L) 6.5 - 8.1 g/dL   Albumin 3.2 (L) 3.5 - 5.0 g/dL   AST 20 15 - 41 U/L   ALT 22 0 - 44 U/L   Alkaline Phosphatase 68 38 - 126 U/L   Total Bilirubin 0.8 0.3 - 1.2 mg/dL   GFR, Estimated 37 (L) >60 mL/min    Comment: (NOTE) Calculated using the CKD-EPI Creatinine Equation (2021)    Anion gap 7 5 - 15    Comment: Performed at [facility], [address] [place], [place] [address]  Lipid panel     Status: Abnormal   Collection Time: 05/14/20   3:50 AM  Result Value Ref Range   Cholesterol 98 0 - 200 mg/dL   Triglycerides 75 <517 mg/dL   HDL 27 (L) >00 mg/dL   Total CHOL/HDL Ratio 3.6 RATIO   VLDL 15 0 - 40 mg/dL   LDL Cholesterol 56 0 - 99 mg/dL    Comment:        Total Cholesterol/HDL:CHD Risk Coronary Heart Disease Risk Table                     Men   Women  1/2 Average Risk   3.4   3.3  Average Risk       5.0   4.4  2 X Average Risk   9.6   7.1  3 X Average Risk  23.4   11.0        Use the calculated Patient Ratio above and the CHD Risk Table to determine the patient's CHD Risk.        ATP III CLASSIFICATION (LDL):  <100     mg/dL   Optimal  174-944  mg/dL   Near or Above                    Optimal  130-159  mg/dL   Borderline  967-591  mg/dL   High  >638     mg/dL   Very High Performed at [facility], [address] [place], [place] [address]   Glucose, capillary     Status: Abnormal   Collection Time: 05/14/20  6:55 AM  Result Value Ref Range   Glucose-Capillary 101 (H) 70 - 99 mg/dL  Comment: Glucose reference range applies only to samples taken after fasting for at least 8 hours.    Medications:  Current Facility-Administered Medications  Medication Dose Route Frequency Provider Last Rate Last Admin  . acetaminophen (TYLENOL) tablet 650 mg  650 mg Oral Q6H PRN Dellia Cloud, MD   650 mg at 05/13/20 2256   Or  . acetaminophen (TYLENOL) suppository 650 mg  650 mg Rectal Q6H PRN Dellia Cloud, MD      . atorvastatin (LIPITOR) tablet 40 mg  40 mg Oral Daily Glenford Bayley, MD   40 mg at 05/14/20 0926  . enoxaparin (LOVENOX) injection 40 mg  40 mg Subcutaneous Q24H Dellia Cloud, MD   40 mg at 05/13/20 1804  . folic acid (FOLVITE) tablet 1 mg  1 mg Oral Daily Dellia Cloud, MD   1 mg at 05/14/20 0926  . losartan (COZAAR) tablet 25 mg  25 mg Oral Daily Glenford Bayley, MD   25 mg at 05/14/20 0926  . multivitamin with minerals tablet 1 tablet  1 tablet Oral Daily Dellia Cloud, MD   1 tablet at  05/14/20 0926  . OLANZapine (ZYPREXA) tablet 5 mg  5 mg Oral QHS Glenford Bayley, MD   5 mg at 05/13/20 2256  . ondansetron (ZOFRAN) tablet 4 mg  4 mg Oral Q6H PRN Dellia Cloud, MD       Or  . ondansetron [facility]) injection 4 mg  4 mg Intravenous Q6H PRN Dellia Cloud, MD      . senna (SENOKOT) tablet 8.6 mg  1 tablet Oral QHS PRN Dellia Cloud, MD      . sodium chloride flush (NS) 0.9 % injection 3 mL  3 mL Intravenous Q12H Dellia Cloud, MD   3 mL at 05/13/20 2256  . thiamine tablet 100 mg  100 mg Oral Daily Dellia Cloud, MD   100 mg at 05/14/20 4098    Musculoskeletal: Strength & Muscle Tone: No atrophy noted. Gait & Station: UTA since patient is sitting in a chair. Patient leans: N/A  Psychiatric Specialty Exam: Physical Exam Vitals and nursing note reviewed.  Constitutional:      Appearance: She is well-developed.  HENT:     Head: Normocephalic and atraumatic.  Pulmonary:     Effort: Pulmonary effort is normal.  Musculoskeletal:        General: Normal range of motion.     Cervical back: Normal range of motion.  Neurological:     Mental Status: She is alert and oriented to person, place, and time.  Psychiatric:        Mood and Affect: Affect is labile.        Speech: Speech is tangential.        Behavior: Behavior normal.        Thought Content: Thought content is delusional.        Cognition and Memory: Memory is impaired.        Judgment: Judgment normal.     Review of Systems  Gastrointestinal: Negative for constipation, diarrhea, nausea and vomiting.  Musculoskeletal: Negative.   Psychiatric/Behavioral: Negative for hallucinations, substance abuse and suicidal ideas.  All other systems reviewed and are negative.   Blood pressure (!) 161/88, pulse 64, temperature 98 F (36.7 C), temperature source Oral, resp. rate 19, height  (1.702 m), weight 101.2 kg, last menstrual period 05/10/2013, SpO2 97 %.Body mass index is 34.94 kg/m.  General Appearance: Fairly  Groomed  Eye Contact:  Good  Speech:  Clear and  Coherent and Normal Rate  Volume:  Normal  Mood:  Euthymic  Affect:  Appropriate and Congruent  Thought Process:  Disorganized and Descriptions of Associations: Loose  Orientation:  Full (Time, Place, and Person)  Thought Content:  Delusions and Paranoid Ideation  Suicidal Thoughts:  No  Homicidal Thoughts:  No  Memory:  Immediate;   Good Recent;   Good Remote;   Good  Judgement:  Impaired  Insight:  Lacking  Psychomotor Activity:  Normal  Concentration:  Concentration: Good and Attention Span: Good  Recall:  Good  Fund of Knowledge:  Good  Language:  Good  Akathisia:  No  Handed:  Right  AIMS (if indicated):   N/A  Assets:  Housing Resilience Social Support  ADL's:  Intact  Cognition:  Impaired due to psychiatric condition.  Sleep:   N/A   Assessment: Wyvonnia LoraMae H Steers is a 70 y.o. female with CHF exacerbation likely secondary to medication noncompliance. Patient endorses delusional thoughts and paranoia. She is irritable and labile in affect. She is tangential in thought process. Recommend resuming her psychotropic medication at this time. There are concerns about the safety efficacy of olanzapine.   Treatment Plan Summary:  -Start Zyprexa 5 mg BID for psychosis. EKG obtained on 03/10, QTC 471. Zyprexa is a safer agent in terms heart disease, and rapid onset for treatment of schizophrenia. Patient is 70 years old, and has diagnosed serious mental illness history, with an extensive history of multiple antipsychotic medications. Zyprexa has been proven to achieve and maintain her psychiatric conditions will continue at this time.   -Patient with multiple inpatient hospitalizations secondary to non-compliance, may benefit from an ACT team or higher level of care such as group home or nursing home. Recommend working with social work to coordinate care with her insurance providers prior to discharge .  -Will sign off on patient at this  time. Please consult psychiatry again as needed.   Disposition: No evidence of imminent risk to self or others at present.   Patient does not meet criteria for psychiatric inpatient admission.  Maryagnes Amosakia S Starkes-Perry, FNP 05/14/2020 1:20 PM

## 2020-05-14 NOTE — Progress Notes (Signed)
   05/14/20 0131  Vitals  Temp 97.6 F (36.4 C)  Temp Source Oral  BP (!) 195/132  MAP (mmHg) 152  BP Location Left Arm  BP Method Automatic  Patient Position (if appropriate) Lying  Pulse Rate 71  Pulse Rate Source Monitor  Resp (!) 22  Level of Consciousness  Level of Consciousness Alert  MEWS COLOR  MEWS Score Color Green  Oxygen Therapy  SpO2 95 %  O2 Device Nasal Cannula  O2 Flow Rate (L/min) 2 L/min  Pain Assessment  Pain Scale 0-10  Pain Score 10  Pain Type Acute pain  Pain Location Leg  Pain Orientation Right;Left  Pain Descriptors / Indicators Cramping  Pain Frequency Constant  Pain Onset On-going  Patients Stated Pain Goal 0  Pain Intervention(s) Medication (See eMAR)  Multiple Pain Sites No  MEWS Score  MEWS Temp 0  MEWS Systolic 0  MEWS Pulse 0  MEWS RR 1  MEWS LOC 0  MEWS Score 1  Provider Notification  Provider Name/Title Elaina Pattee, MD  Date Provider Notified 05/14/20  Time Provider Notified [phone]  Notification Type Page  Notification Reason Other (Comment) (eleveated bp, shortness of breath and leg cramps)  Provider response See new orders  Date of Provider Response 05/14/20  Time of Provider Response 0152  Rapid Response Notification  Name of Rapid Response RN Notified No

## 2020-05-14 NOTE — Progress Notes (Signed)
  Date: 05/14/2020  Patient name: Erin Riley  Medical record number: [id number]  Date of birth: 08/04/1950   I have seen and evaluated Erin Riley and discussed their care with the Residency Team. Briefly, Erin Riley is a 70 year old woman with PMH of hypertensive CM, aortic stenosis, PAD, CKD stage 3. HTN, HLD and schizophrenia who presented with accelerated hypertension, volume overload, SOB.  She improved with doses of IV lasix.  She has not been on her medications due to feeling like she does not need them and she has also been having increased delusional behavior.  TTE was done today which showed a new reduced EF. She denies chest pain.  She has new wheezing and oxygen requirement.   Vitals:   05/14/20 1715 05/14/20 1718  BP: (!) 177/105 (!) 177/105  Pulse: 70 70  Resp:  19  Temp:  (!) 97.5 F (36.4 C)  SpO2:  97%   Gen: Lying in bed, no distress, but she is wearing oxygen now Eyes: Anicteric sclerae CV: RR, + S3, loud systolic murmur, JVD to mid neck.  + 1+ edema to mid shins bilaterally Pulm: Clear, but with decreased sounds at the bases Abd: Obese, NT, ND, +BS MSK: Normal tone and bulk Psych: Pleasant, no distress, + tardive dyskinesia to chin (chewing movement)  Assessment and Plan: I have seen and evaluated the patient as outlined above. I agree with the formulated Assessment and Plan as detailed in the residents' note, with the following changes:   1. Severe symptomatic hypertension, accelerated HTN - Will plan to normalize BP over 24 - 72 hours - Goal for today will be ~160/110 - Monitor for acute changes to mentation or other issues.  - PRN labetalol for very high pressures  2. Acute on chronic HFpEF, new HFrEF on echo - Troponin are flat - No change to EKG - TTE as above - Check TSH - Lasix daily - STrict I/O, daily weight - Started coreg today  Other issues per Dr. Jerene Canny daily note.   Inez Catalina, MD 3/11/20225:52 PM

## 2020-05-14 NOTE — Plan of Care (Signed)
  Problem: Education: Goal: Knowledge of General Education information will improve Description: Including pain rating scale, medication(s)/side effects and non-pharmacologic comfort measures Outcome: Progressing   Problem: Safety: Goal: Ability to remain free from injury will improve Outcome: Progressing   

## 2020-05-14 NOTE — Progress Notes (Addendum)
New Admission Note: ? Arrival Method: Stretcher  Mental Orientation: AXOX4 Telemetry: Box 7 Assessment: Completed Skin: Refer to flowsheet IV: Left AC Pain: 0/10 Tubes: Safety Measures: Safety Fall Prevention Plan discussed with patient. Admission: Completed [facility] [place] Orientation: Patient has been orientated to the room, unit and the staff. Family: None at the bedside  Orders have been reviewed and are being implemented. Will continue to monitor the patient. Call light has been placed within reach and bed alarm has been activated.  ? Donia Guiles, RN  Phone Number: [phone]

## 2020-05-14 NOTE — Progress Notes (Signed)
  Echocardiogram 2D Echocardiogram has been performed.  Erin Riley 05/14/2020, 9:18 AM

## 2020-05-14 NOTE — Progress Notes (Addendum)
Heart Failure Navigator Progress Note  Assessed for Heart & Vascular TOC clinic readiness. Unfortunately at this time the patient does not meet criteria due to paranoid delusions regarding patient being on maternity leave, just having a baby (or three) 3 months ago. Pt has frequent ED and MAU visits for similar delusions. Pt has long history of non-compliance. Potential plan upon DC is to group home or ACT team.    Navigator available for reassessment of patient.   Ozella Rocks, RN, BSN Heart Failure Nurse Navigator [phone]

## 2020-05-15 LAB — BASIC METABOLIC PANEL
Anion gap: 6 (ref 5–15)
BUN: 24 mg/dL — ABNORMAL HIGH (ref 8–23)
CO2: 28 mmol/L (ref 22–32)
Calcium: 8.8 mg/dL — ABNORMAL LOW (ref 8.9–10.3)
Chloride: 107 mmol/L (ref 98–111)
Creatinine, Ser: 1.43 mg/dL — ABNORMAL HIGH (ref 0.44–1.00)
GFR, Estimated: 40 mL/min — ABNORMAL LOW (ref >60)
Glucose, Bld: 123 mg/dL — ABNORMAL HIGH (ref 70–99)
Potassium: 3.8 mmol/L (ref 3.5–5.1)
Sodium: 141 mmol/L (ref 135–145)

## 2020-05-15 LAB — MAGNESIUM: Magnesium: 1.9 mg/dL (ref 1.7–2.4)

## 2020-05-15 LAB — CBC
HCT: 31.2 % — ABNORMAL LOW (ref 36.0–46.0)
Hemoglobin: 9.6 g/dL — ABNORMAL LOW (ref 12.0–15.0)
MCH: 29.7 pg (ref 26.0–34.0)
MCHC: 30.8 g/dL (ref 30.0–36.0)
MCV: 96.6 fL (ref 80.0–100.0)
Platelets: 283 10*3/uL (ref 150–400)
RBC: 3.23 MIL/uL — ABNORMAL LOW (ref 3.87–5.11)
RDW: 16.8 % — ABNORMAL HIGH (ref 11.5–15.5)
WBC: 6.7 10*3/uL (ref 4.0–10.5)
nRBC: 0 % (ref 0.0–0.2)

## 2020-05-15 LAB — GLUCOSE, CAPILLARY: Glucose-Capillary: 83 mg/dL (ref 70–99)

## 2020-05-15 LAB — PHOSPHORUS: Phosphorus: 2.9 mg/dL (ref 2.5–4.6)

## 2020-05-15 MED ORDER — CARVEDILOL 12.5 MG PO TABS
12.5000 mg | ORAL_TABLET | Freq: Once | ORAL | Status: AC
  Administered 2020-05-15: 12.5 mg via ORAL
  Filled 2020-05-15: qty 1

## 2020-05-15 MED ORDER — FUROSEMIDE 40 MG PO TABS: 40.0000 mg | ORAL_TABLET | Freq: Every day | ORAL | Status: DC

## 2020-05-15 MED ORDER — FUROSEMIDE 10 MG/ML IJ SOLN
80.0000 mg | Freq: Two times a day (BID) | INTRAMUSCULAR | Status: AC
  Administered 2020-05-15 – 2020-05-16 (×2): 80 mg via INTRAVENOUS
  Filled 2020-05-15 (×2): qty 8

## 2020-05-15 MED ORDER — LOSARTAN POTASSIUM 50 MG PO TABS
50.0000 mg | ORAL_TABLET | Freq: Every day | ORAL | Status: DC
  Administered 2020-05-16 – 2020-05-17 (×2): 50 mg via ORAL
  Filled 2020-05-15: qty 1

## 2020-05-15 MED ORDER — CARVEDILOL 25 MG PO TABS
25.0000 mg | ORAL_TABLET | Freq: Two times a day (BID) | ORAL | Status: DC
  Administered 2020-05-16 – 2020-05-20 (×10): 25 mg via ORAL
  Filled 2020-05-15 (×10): qty 1

## 2020-05-15 MED ORDER — HYDRALAZINE HCL 50 MG PO TABS
50.0000 mg | ORAL_TABLET | Freq: Three times a day (TID) | ORAL | Status: DC
  Administered 2020-05-15 – 2020-05-16 (×3): 50 mg via ORAL
  Filled 2020-05-15 (×3): qty 1

## 2020-05-15 MED ORDER — POTASSIUM CHLORIDE CRYS ER 20 MEQ PO TBCR
40.0000 meq | EXTENDED_RELEASE_TABLET | Freq: Once | ORAL | Status: AC
  Administered 2020-05-15: 40 meq via ORAL
  Filled 2020-05-15: qty 2

## 2020-05-15 MED ORDER — LOSARTAN POTASSIUM 25 MG PO TABS
25.0000 mg | ORAL_TABLET | Freq: Once | ORAL | Status: AC
  Administered 2020-05-15: 25 mg via ORAL
  Filled 2020-05-15: qty 1

## 2020-05-15 MED ORDER — ISOSORBIDE MONONITRATE ER 30 MG PO TB24
30.0000 mg | ORAL_TABLET | Freq: Every day | ORAL | Status: DC
  Administered 2020-05-15: 30 mg via ORAL
  Filled 2020-05-15: qty 1

## 2020-05-15 MED ORDER — FUROSEMIDE 10 MG/ML IJ SOLN: 80.0000 mg | Freq: Once | INTRAMUSCULAR | Status: DC

## 2020-05-15 NOTE — Progress Notes (Signed)
Cardiology Progress Note  Patient ID: Erin Riley MRN: [id number] DOB: 1950-12-24 Date of Encounter: 05/15/2020  Primary Cardiologist: Bryan Lemma, MD  Subjective   Chief Complaint: None.  HPI: Sleeping during my exam.  Would not wake up to talk with me.  Still with elevated JVD.  ROS:  All other ROS reviewed and negative. Pertinent positives noted in the HPI.     Inpatient Medications  Scheduled Meds: . atorvastatin  40 mg Oral Daily  . carvedilol  12.5 mg Oral Once  . carvedilol  25 mg Oral BID WC  . enoxaparin (LOVENOX) injection  40 mg Subcutaneous Q24H  . ferrous gluconate  324 mg Oral Q breakfast  . folic acid  1 mg Oral Daily  . furosemide  80 mg Intravenous Once  . hydrALAZINE  50 mg Oral Q8H  . isosorbide mononitrate  30 mg Oral Daily  . losartan  25 mg Oral Once  . [START ON 05/16/2020] losartan  50 mg Oral Daily  . multivitamin with minerals  1 tablet Oral Daily  . OLANZapine  5 mg Oral BID  . potassium chloride  40 mEq Oral Once  . sodium chloride flush  3 mL Intravenous Q12H  . thiamine  100 mg Oral Daily   Continuous Infusions:  PRN Meds: acetaminophen **OR** acetaminophen, ondansetron **OR** ondansetron (ZOFRAN) IV, senna   Vital Signs   Vitals:   05/15/20 0000 05/15/20 0458 05/15/20 0500 05/15/20 0909  BP: (!) 149/94 139/89  (!) 160/82  Pulse: 65 70  63  Resp: Temp:  98.2 F (36.8 C)  98.2 F (36.8 C)  TempSrc:  Oral    SpO2:  100%  97%  Weight:   103.9 kg   Height:        Intake/Output Summary (Last 24 hours) at 05/15/2020 1211 Last data filed at 05/15/2020 0900 Gross per 24 hour  Intake 1295 ml  Output 2600 ml  Net -1305 ml   Last 3 Weights 05/15/2020 05/14/2020 12/31/2019  Weight (lbs) 229 lb 0.9 oz 223 lb 1.6 oz 190 lb  Weight (kg) 103.9 kg 101.197 kg 86.183 kg  Some encounter information is confidential and restricted. Go to Review Flowsheets activity to see all data.      Telemetry  Overnight telemetry shows normal  sinus rhythm heart in the 60s, brief A. tach episode of note, which I personally reviewed.   ECG  The most recent ECG shows normal sinus rhythm heart rate 87, LVH by voltage with repolarization normality, which I personally reviewed.   Physical Exam   Vitals:   05/15/20 0000 05/15/20 0458 05/15/20 0500 05/15/20 0909  BP: (!) 149/94 139/89  (!) 160/82  Pulse: 65 70  63  Resp: Temp:  98.2 F (36.8 C)  98.2 F (36.8 C)  TempSrc:  Oral    SpO2:  100%  97%  Weight:   103.9 kg   Height:         Intake/Output Summary (Last 24 hours) at 05/15/2020 1211 Last data filed at 05/15/2020 0900 Gross per 24 hour  Intake 1295 ml  Output 2600 ml  Net -1305 ml    Last 3 Weights 05/15/2020 05/14/2020 12/31/2019  Weight (lbs) 229 lb 0.9 oz 223 lb 1.6 oz 190 lb  Weight (kg) 103.9 kg 101.197 kg 86.183 kg  Some encounter information is confidential and restricted. Go to Review Flowsheets activity to see all data.    Body mass  index is 35.88 kg/m.   General: Well nourished, well developed, in no acute distress Head: Atraumatic, normal size  Eyes: PEERLA, EOMI  Neck: Supple, JVD 12 to 15 cm of water Endocrine: No thryomegaly Cardiac: Normal S1, S2; RRR; 3 out of 6 systolic ejection murmur Lungs: Rales at the lung bases Abd: Soft, nontender, no hepatomegaly  Ext: No edema, pulses 2+ Musculoskeletal: No deformities, BUE and BLE strength normal and equal Skin: Warm and dry, no rashes   Neuro: Sleeping, would not wake up to talk with me  Labs  High Sensitivity Troponin:   Recent Labs  Lab 05/13/20 1210 05/13/20 1440  TROPONINIHS 35* 37*     Cardiac EnzymesNo results for input(s): TROPONINI in the last 168 hours. No results for input(s): TROPIPOC in the last 168 hours.  Chemistry Recent Labs  Lab 05/13/20 1210 05/14/20 0350 05/15/20 0327  NA 140 141 141  K 3.7 3.6 3.8  CL 111 108 107  CO2 23 26 28   GLUCOSE 105* 104* 123*  BUN 28* 28* 24*  CREATININE 1.27* 1.54* 1.43*   CALCIUM 9.0 9.0 8.8*  PROT  --  6.3*  --   ALBUMIN  --  3.2*  --   AST  --  20  --   ALT  --  22  --   ALKPHOS  --  68  --   BILITOT  --  0.8  --   GFRNONAA 46* 37* 40*  ANIONGAP 6 7 6     Hematology Recent Labs  Lab 05/13/20 1210 05/14/20 0350 05/15/20 0327  WBC 6.3 6.2 6.7  RBC 3.40* 3.29* 3.23*  HGB 10.1* 9.9* 9.6*  HCT 33.3* 31.8* 31.2*  MCV 97.9 96.7 96.6  MCH 29.7 30.1 29.7  MCHC 30.3 31.1 30.8  RDW 17.0* 16.9* 16.8*  PLT 313 309 283   BNP Recent Labs  Lab 05/13/20 1210  BNP 2,524.0*    DDimer No results for input(s): DDIMER in the last 168 hours.   Radiology  DG Chest 2 View  Result Date: 05/13/2020 CLINICAL DATA:  Short of breath and chest pain EXAM: CHEST - 2 VIEW COMPARISON:  08/08/2019 FINDINGS: Cardiac enlargement. Mild vascular congestion without edema or effusion. Negative for pneumonia Right shoulder replacement. Disc degeneration and spurring throughout the thoracic spine. IMPRESSION: Cardiac enlargement with mild vascular congestion. Negative for edema or effusion. Electronically Signed   By: 07/13/2020 M.D.   On: 05/13/2020 12:53   DG CHEST PORT 1 VIEW  Result Date: 05/14/2020 CLINICAL DATA:  Shortness of breath EXAM: PORTABLE CHEST 1 VIEW COMPARISON:  05/13/2020 radiograph, 04/18/2018 CT FINDINGS: Diffuse hazy and interstitial opacities throughout both lungs with cuffing, fissural and septal thickening and features of pulmonary vascular congestion. Cardiomegaly is again seen, slightly accentuated by the portable technique. The aorta is calcified. The remaining cardiomediastinal contours are unremarkable. Prior right shoulder arthroplasty. Severe degenerative changes in the left shoulder. Slightly high-riding appearance of both humeral heads likely reflecting underlying rotator cuff insufficiency. No acute osseous or soft tissue abnormality. Telemetry leads and external support devices overlie the chest. IMPRESSION: Features of CHF/volume overload  including cardiomegaly and pulmonary vascular congestion. Hazy interstitial opacities suggest edema, particularly in the absence of infectious symptoms. Electronically Signed   By: 07/13/2020 M.D.   On: 05/14/2020 02:31   ECHOCARDIOGRAM COMPLETE  Result Date: 05/14/2020    ECHOCARDIOGRAM REPORT   Patient Name:   Erin Riley Date of Exam: 05/14/2020 Medical Rec #:  Gwendolyn Fill   Height:  67.0 in Accession #:    [id number]  Weight:       190.0 lb Date of Birth:  June 06, 1950   BSA:          1.979 m Patient Age:    69 years    BP:           160/94 mmHg Patient Gender: F           HR:           64 bpm. Exam Location:  Inpatient Procedure: 2D Echo, Cardiac Doppler and Color Doppler Indications:    CHF                 Pulmonary hypertension                 Aortic stenosis  History:        Patient has prior history of Echocardiogram examinations, most                 recent 09/11/2018. Signs/Symptoms:Shortness of Breath; Risk                 Factors:Hypertension, Dyslipidemia and Former Smoker. PAD. CKD.  Sonographer:    Ross Ludwig RDCS (AE) Referring Phys: [id number] Heide Scales  Sonographer Comments: Image acquisition challenging due to respiratory motion. Image acquisition challeing due to patient movement. IMPRESSIONS  1. Left ventricular ejection fraction, by estimation, is 30 to 35%. The left ventricle has moderately decreased function. The left ventricle demonstrates global hypokinesis. There is severe asymmetric left ventricular hypertrophy of the posterior-lateral segment. Left ventricular diastolic parameters are consistent with Grade III diastolic dysfunction (restrictive).  2. Right ventricular systolic function is normal. The right ventricular size is normal. There is moderately elevated pulmonary artery systolic pressure. The estimated right ventricular systolic pressure is 46.6 mmHg.  3. Left atrial size was severely dilated.  4. The mitral valve is grossly normal. Mild mitral valve regurgitation.  No evidence of mitral stenosis.  5. Tricuspid valve regurgitation is moderate.  6. The aortic valve is abnormal. There is severe calcifcation of the aortic valve. Aortic valve regurgitation is trivial. Moderate aortic valve stenosis. Aortic valve mean gradient measures 25.2 mmHg.  7. Aortic dilatation noted. There is borderline dilatation of the ascending aorta, measuring 39 mm.  8. The inferior vena cava is dilated in size with <50% respiratory variability, suggesting right atrial pressure of 15 mmHg.  9. Cannot exclude atrial level shunt suggested by color flow Doppler. FINDINGS  Left Ventricle: Left ventricular ejection fraction, by estimation, is 30 to 35%. The left ventricle has moderately decreased function. The left ventricle demonstrates global hypokinesis. 3D left ventricular ejection fraction analysis performed but not reported based on interpreter judgement due to suboptimal quality. The left ventricular internal cavity size was normal in size. There is severe asymmetric left ventricular hypertrophy of the posterior-lateral segment. Left ventricular diastolic parameters are consistent with Grade III diastolic dysfunction (restrictive). Right Ventricle: The right ventricular size is normal. No increase in right ventricular wall thickness. Right ventricular systolic function is normal. There is moderately elevated pulmonary artery systolic pressure. The tricuspid regurgitant velocity is 2.81 m/s, and with an assumed right atrial pressure of 15 mmHg, the estimated right ventricular systolic pressure is 46.6 mmHg. Left Atrium: Left atrial size was severely dilated. Right Atrium: Right atrial size was normal in size. Pericardium: There is no evidence of pericardial effusion. Mitral Valve: The mitral valve is grossly normal. Mild mitral annular calcification. Mild mitral valve regurgitation.  No evidence of mitral valve stenosis. MV peak gradient, 4.5 mmHg. The mean mitral valve gradient is 1.0 mmHg. Tricuspid  Valve: The tricuspid valve is normal in structure. Tricuspid valve regurgitation is moderate . No evidence of tricuspid stenosis. Aortic Valve: The aortic valve is abnormal. There is severe calcifcation of the aortic valve. Aortic valve regurgitation is trivial. Aortic regurgitation PHT measures 471 msec. Moderate aortic stenosis is present. Aortic valve mean gradient measures 25.2  mmHg. Aortic valve peak gradient measures 48.0 mmHg. Aortic valve area, by VTI measures 1.26 cm. Pulmonic Valve: The pulmonic valve was normal in structure. Pulmonic valve regurgitation is trivial. No evidence of pulmonic stenosis. Aorta: Aortic dilatation noted. There is borderline dilatation of the ascending aorta, measuring 39 mm. Venous: The inferior vena cava is dilated in size with less than 50% respiratory variability, suggesting right atrial pressure of 15 mmHg. IAS/Shunts: Cannot exclude atrial level shunt suggested by color flow Doppler.  LEFT VENTRICLE PLAX 2D LVIDd:         5.50 cm      Diastology LVIDs:         4.50 cm      LV e' medial:    2.61 cm/s LV PW:         1.80 cm      LV E/e' medial:  38.7 LV IVS:        1.30 cm      LV e' lateral:   3.30 cm/s LVOT diam:     2.20 cm      LV E/e' lateral: 30.6 LV SV:         93 LV SV Index:   47 LVOT Area:     3.80 cm                              3D Volume EF: LV Volumes (MOD)            3D EF:        45 % LV vol d, MOD A2C: 197.0 ml LV EDV:       282 ml LV vol d, MOD A4C: 157.0 ml LV ESV:       156 ml LV vol s, MOD A2C: 115.0 ml LV SV:        126 ml LV vol s, MOD A4C: 112.0 ml LV SV MOD A2C:     82.0 ml LV SV MOD A4C:     157.0 ml LV SV MOD BP:      65.2 ml RIGHT VENTRICLE            IVC RV Basal diam:  3.10 cm    IVC diam: 2.40 cm RV S prime:     7.83 cm/s TAPSE (M-mode): 1.9 cm LEFT ATRIUM              Index       RIGHT ATRIUM           Index LA diam:        4.60 cm  2.32 cm/m  RA Area:     17.40 cm LA Vol (A2C):   136.0 ml 68.73 ml/m RA Volume:   44.40 ml  22.44 ml/m LA  Vol (A4C):   88.9 ml  44.93 ml/m LA Biplane Vol: 111.0 ml 56.10 ml/m  AORTIC VALVE AV Area (Vmax):    1.28 cm AV Area (Vmean):   1.23 cm AV Area (VTI):  1.26 cm AV Vmax:           346.50 cm/s AV Vmean:          230.000 cm/s AV VTI:            0.733 m AV Peak Grad:      48.0 mmHg AV Mean Grad:      25.2 mmHg LVOT Vmax:         117.00 cm/s LVOT Vmean:        74.700 cm/s LVOT VTI:          0.244 m LVOT/AV VTI ratio: 0.33 AI PHT:            471 msec  AORTA Ao Root diam: 3.10 cm Ao Asc diam:  3.90 cm MITRAL VALVE                 TRICUSPID VALVE MV Area (PHT): 3.12 cm      TR Peak grad:   31.6 mmHg MV Area VTI:   2.53 cm      TR Vmax:        281.00 cm/s MV Peak grad:  4.5 mmHg MV Mean grad:  1.0 mmHg      SHUNTS MV Vmax:       1.06 m/s      Systemic VTI:  0.24 m MV Vmean:      52.6 cm/s     Systemic Diam: 2.20 cm MV Decel Time: 243 msec MR Peak grad:    101.2 mmHg MR Mean grad:    65.0 mmHg MR Vmax:         503.00 cm/s MR Vmean:        378.0 cm/s MR PISA:         1.01 cm MR PISA Eff ROA: 8 mm MR PISA Radius:  0.40 cm MV E velocity: 101.00 cm/s MV A velocity: 47.60 cm/s MV E/A ratio:  2.12 Weston BrassGayatri Acharya MD Electronically signed by Weston BrassGayatri Acharya MD Signature Date/Time: 05/14/2020/9:47:11 AM    Final     Cardiac Studies  TTE 05/14/2020 1. Left ventricular ejection fraction, by estimation, is 30 to 35%. The  left ventricle has moderately decreased function. The left ventricle  demonstrates global hypokinesis. There is severe asymmetric left  ventricular hypertrophy of the  posterior-lateral segment. Left ventricular diastolic parameters are  consistent with Grade III diastolic dysfunction (restrictive).  2. Right ventricular systolic function is normal. The right ventricular  size is normal. There is moderately elevated pulmonary artery systolic  pressure. The estimated right ventricular systolic pressure is 46.6 mmHg.  3. Left atrial size was severely dilated.  4. The mitral valve is grossly  normal. Mild mitral valve regurgitation.  No evidence of mitral stenosis.  5. Tricuspid valve regurgitation is moderate.  6. The aortic valve is abnormal. There is severe calcifcation of the  aortic valve. Aortic valve regurgitation is trivial. Moderate aortic valve  stenosis. Aortic valve mean gradient measures 25.2 mmHg.  7. Aortic dilatation noted. There is borderline dilatation of the  ascending aorta, measuring 39 mm.  8. The inferior vena cava is dilated in size with <50% respiratory  variability, suggesting right atrial pressure of 15 mmHg.  9. Cannot exclude atrial level shunt suggested by color flow Doppler.   Patient Profile  Erin Riley is a 70 y.o. female with moderate nonobstructive CAD, hypertension with hypertensive heart disease, moderate aortic stenosis, paranoid schizophrenia, CKD stage III, PAD who was admitted on 05/14/2020 for acute systolic heart failure.  Assessment & Plan   1.  New onset systolic heart failure, EF 30-35/hypertensive heart disease/medication noncompliance -Still volume up with considerable JVD.  Only net -1.1 L. -We will give Lasix 80 mg IV twice daily today. -BP not controlled.  Increase Coreg to 25 twice a day.  I have added hydralazine 50 mg 3 times daily and Imdur 30 mg daily.  These can be further titrated. -Placed on an ARB.  We should be cautious in the setting of significant CKD.  She may not tolerate this. -Would not recommend MRI at this point. -Limited options as she is noncompliant and has significant history of mental illness.  Medications are the best choice for her at this time. -Troponin elevation is minimal and flat.  This is secondary to hypertensive heart disease.  She is not a candidate for cardiac cath.  2.  Moderate aortic stenosis -This will be followed as an outpatient.  No further work-up needed this admission.  For questions or updates, please contact [facility] Please consult [email or web address] for contact info under    Time Spent with Patient: I have spent a total of 25 minutes with patient reviewing hospital notes, telemetry, EKGs, labs and examining the patient as well as establishing an assessment and plan that was discussed with the patient.  > 50% of time was spent in direct patient care.    Signed, Lenna Gilford. Flora Lipps, MD, [facility] [place]  [facility]  05/15/2020 12:11 PM

## 2020-05-15 NOTE — Progress Notes (Addendum)
Occupational Therapy Evaluation Patient Details Name: Erin Riley MRN: [id number] DOB: 1951/01/13 Today's Date: 05/15/2020    History of Present Illness Erin Riley is a 70 y.o. female admitted for  evaluation of CHF.  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   Clinical Impression   Pt received with above diagnosis. PTA pt PLOF living alone (per chart review) but reports to OT that she lives someone. I with ADLs and IADLs, use of public transportation. Pt currently limited with safe ADL engagement due to limitation with activity tolerance, BP levels (see below), and safety awareness. Pt will benefit from continued acute OT to address established deficits to maximize independence in ADLs with education in safety awareness, compensatory strategies, and energy conservation. DC recommendation to HHOT.     Follow Up Recommendations  Home health OT    Equipment Recommendations  3 in 1 bedside commode (pt reports not buying any equipment)    Recommendations for Other Services       Precautions / Restrictions Precautions Precautions: Fall      Mobility Bed Mobility Overal bed mobility: Independent                  Transfers Overall transfer level: Needs assistance Equipment used: None Transfers: Sit to/from [employer] Transfers Sit to Stand: Supervision Stand pivot transfers: Min guard       General transfer comment: min guard for safety as pt moves impulsively with functional transfers.    Balance Overall balance assessment: Needs assistance Sitting-balance support: No upper extremity supported;Feet supported Sitting balance-Leahy Scale: Fair     Standing balance support: Bilateral upper extremity supported;No upper extremity supported;During functional activity Standing balance-Leahy Scale:  Fair Standing balance comment: can stand statically without UE support to clean self but gets very SOB                           ADL either performed or assessed with clinical judgement   ADL Overall ADL's : Modified independent                                       General ADL Comments: No physical assistance required to perform self care tasks, pt however limited with safety awareness and decreased insight of current condition. Demonstrates impulsivity     Vision         Perception     Praxis      Pertinent Vitals/Pain Pain Assessment: Faces Faces Pain Scale: No hurt Pain Location: feet and legs Pain Descriptors / Indicators: Aching;Discomfort Pain Intervention(s): Monitored during session;Repositioned     Hand Dominance Right   Extremity/Trunk Assessment Upper Extremity Assessment Upper Extremity Assessment: Overall WFL for tasks assessed   Lower Extremity Assessment Lower Extremity Assessment: Defer to PT evaluation   Cervical / Trunk Assessment Cervical / Trunk Assessment: Normal   Communication Communication Communication: No difficulties   Cognition Arousal/Alertness: Awake/alert Behavior During Therapy: WFL for tasks assessed/performed Overall Cognitive Status: Within Functional Limits for tasks assessed                                 General Comments: Appears to have some decr safety awareness as she moved  impulsively to 3n1   General Comments  BP levels checked at 174/86 sitting 184/109 standing  186/97 sitting. Reported to RN.    Exercises     Shoulder Instructions      Home Living Family/patient expects to be discharged to:: Private residence Living Arrangements: Alone   Type of Home: Other(Comment) (motel) Home Access: Level entry     Home Layout: One level     Bathroom Shower/Tub: Chief Strategy Officer: Standard     Home Equipment: None   Additional Comments: catches bus to  travel, with chart review pt reports living alone, however, reports to OT that she does live with someone. No AE PTA      Prior Functioning/Environment Level of Independence: Independent                 OT Problem List: Decreased activity tolerance;Pain;Decreased safety awareness      OT Treatment/Interventions: Self-care/ADL training;Energy conservation;Balance training;Other (comment);Patient/family education    OT Goals(Current goals can be found in the care plan section) Acute Rehab OT Goals Patient Stated Goal: to go home OT Goal Formulation: With patient Time For Goal Achievement: 05/29/20 Potential to Achieve Goals: Fair  OT Frequency: Min 2X/week   Barriers to D/C:            Co-evaluation              AM-PAC OT "6 Clicks" Daily Activity     Outcome Measure Help from another person eating meals?: None Help from another person taking care of personal grooming?: None Help from another person toileting, which includes using toliet, bedpan, or urinal?: A Little Help from another person bathing (including washing, rinsing, drying)?: A Little Help from another person to put on and taking off regular upper body clothing?: None Help from another person to put on and taking off regular lower body clothing?: None 6 Click Score: 22   End of Session Equipment Utilized During Treatment: Gait belt Nurse Communication: Mobility status  Activity Tolerance: Patient limited by lethargy Patient left: in chair;with call bell/phone within reach  OT Visit Diagnosis: Unsteadiness on feet (R26.81);Pain                Time: 5638-7564 OT Time Calculation (min): 17 min Charges:  OT General Charges $OT Visit: 1 Visit OT Evaluation $OT Eval Low Complexity: 1 Low  [place] Old, MSOT, OTR/L  Supplemental Rehabilitation Services  [phone]   Zigmund Daniel 05/15/2020, 10:50 AM

## 2020-05-16 LAB — RETICULOCYTES
Immature Retic Fract: 18.4 % — ABNORMAL HIGH (ref 2.3–15.9)
RBC.: 3.47 MIL/uL — ABNORMAL LOW (ref 3.87–5.11)
Retic Count, Absolute: 105.1 10*3/uL (ref 19.0–186.0)
Retic Ct Pct: 3 % (ref 0.4–3.1)

## 2020-05-16 LAB — CBC
HCT: 34 % — ABNORMAL LOW (ref 36.0–46.0)
Hemoglobin: 10.3 g/dL — ABNORMAL LOW (ref 12.0–15.0)
MCH: 29.3 pg (ref 26.0–34.0)
MCHC: 30.3 g/dL (ref 30.0–36.0)
MCV: 96.9 fL (ref 80.0–100.0)
Platelets: 313 10*3/uL (ref 150–400)
RBC: 3.51 MIL/uL — ABNORMAL LOW (ref 3.87–5.11)
RDW: 16.6 % — ABNORMAL HIGH (ref 11.5–15.5)
WBC: 6.6 10*3/uL (ref 4.0–10.5)
nRBC: 0 % (ref 0.0–0.2)

## 2020-05-16 LAB — BASIC METABOLIC PANEL
Anion gap: 4 — ABNORMAL LOW (ref 5–15)
BUN: 22 mg/dL (ref 8–23)
CO2: 33 mmol/L — ABNORMAL HIGH (ref 22–32)
Calcium: 9 mg/dL (ref 8.9–10.3)
Chloride: 102 mmol/L (ref 98–111)
Creatinine, Ser: 1.33 mg/dL — ABNORMAL HIGH (ref 0.44–1.00)
GFR, Estimated: 44 mL/min — ABNORMAL LOW (ref >60)
Glucose, Bld: 112 mg/dL — ABNORMAL HIGH (ref 70–99)
Potassium: 3.7 mmol/L (ref 3.5–5.1)
Sodium: 139 mmol/L (ref 135–145)

## 2020-05-16 LAB — GLUCOSE, CAPILLARY: Glucose-Capillary: 140 mg/dL — ABNORMAL HIGH (ref 70–99)

## 2020-05-16 LAB — MAGNESIUM: Magnesium: 1.9 mg/dL (ref 1.7–2.4)

## 2020-05-16 MED ORDER — FUROSEMIDE 10 MG/ML IJ SOLN
80.0000 mg | Freq: Once | INTRAMUSCULAR | Status: AC
  Administered 2020-05-16: 80 mg via INTRAVENOUS
  Filled 2020-05-16: qty 8

## 2020-05-16 MED ORDER — ISOSORBIDE MONONITRATE ER 60 MG PO TB24
60.0000 mg | ORAL_TABLET | Freq: Every day | ORAL | Status: DC
Start: 2020-05-16 — End: 2020-05-21
  Administered 2020-05-16 – 2020-05-20 (×5): 60 mg via ORAL
  Filled 2020-05-16 (×5): qty 1

## 2020-05-16 MED ORDER — HYDRALAZINE HCL 50 MG PO TABS
100.0000 mg | ORAL_TABLET | Freq: Three times a day (TID) | ORAL | Status: DC
  Administered 2020-05-16 (×2): 100 mg via ORAL
  Administered 2020-05-17: 50 mg via ORAL
  Administered 2020-05-17 – 2020-05-18 (×2): 100 mg via ORAL
  Filled 2020-05-16 (×6): qty 2

## 2020-05-16 MED ORDER — MAGNESIUM SULFATE 2 GM/50ML IV SOLN
2.0000 g | Freq: Once | INTRAVENOUS | Status: AC
  Administered 2020-05-16: 2 g via INTRAVENOUS
  Filled 2020-05-16: qty 50

## 2020-05-16 NOTE — Progress Notes (Signed)
Cardiology Progress Note  Patient ID: Erin Riley MRN: [id number] DOB: [birth year] Date of Encounter: 05/16/2020  Primary Cardiologist: Bryan Lemma, MD  Subjective   Chief Complaint: Shortness of breath.  HPI: Good diuresis.  Creatinine coming down.  Still short of breath.  ROS:  All other ROS reviewed and negative. Pertinent positives noted in the HPI.     Inpatient Medications  Scheduled Meds: . atorvastatin  40 mg Oral Daily  . carvedilol  25 mg Oral BID WC  . enoxaparin (LOVENOX) injection  40 mg Subcutaneous Q24H  . ferrous gluconate  324 mg Oral Q breakfast  . folic acid  1 mg Oral Daily  . hydrALAZINE  50 mg Oral Q8H  . isosorbide mononitrate  30 mg Oral Daily  . losartan  50 mg Oral Daily  . multivitamin with minerals  1 tablet Oral Daily  . OLANZapine  5 mg Oral BID  . sodium chloride flush  3 mL Intravenous Q12H  . thiamine  100 mg Oral Daily   Continuous Infusions: . magnesium sulfate bolus IVPB 2 g (05/16/20 0839)   PRN Meds: acetaminophen **OR** acetaminophen, ondansetron **OR** ondansetron (ZOFRAN) IV, senna   Vital Signs   Vitals:   05/15/20 2157 05/15/20 2300 05/16/20 0453 05/16/20 0500  BP: (!) 154/80 127/64 (!) 159/97   Pulse: 70 71 71   Resp: 16 16 15    Temp: 97.8 F (36.6 C)  98 F (36.7 C)   TempSrc:      SpO2: 94%  93%   Weight:    102.2 kg  Height:        Intake/Output Summary (Last 24 hours) at 05/16/2020 0859 Last data filed at 05/16/2020 0600 Gross per 24 hour  Intake 1680 ml  Output 4950 ml  Net -3270 ml   Last 3 Weights 05/16/2020 05/15/2020 05/14/2020  Weight (lbs) 225 lb 5 oz 229 lb 0.9 oz 223 lb 1.6 oz  Weight (kg) 102.2 kg 103.9 kg 101.197 kg  Some encounter information is confidential and restricted. Go to Review Flowsheets activity to see all data.      Telemetry  Overnight telemetry shows brief A. tach episodes, sinus rhythm in the 70s, which I personally reviewed.   ECG  The most recent ECG shows normal sinus  rhythm, LVH by voltage with repolarization normality, which I personally reviewed.   Physical Exam   Vitals:   05/15/20 2157 05/15/20 2300 05/16/20 0453 05/16/20 0500  BP: (!) 154/80 127/64 (!) 159/97   Pulse: 70 71 71   Resp: 16 16 15    Temp: 97.8 F (36.6 C)  98 F (36.7 C)   TempSrc:      SpO2: 94%  93%   Weight:    102.2 kg  Height:         Intake/Output Summary (Last 24 hours) at 05/16/2020 0859 Last data filed at 05/16/2020 0600 Gross per 24 hour  Intake 1680 ml  Output 4950 ml  Net -3270 ml    Last 3 Weights 05/16/2020 05/15/2020 05/14/2020  Weight (lbs) 225 lb 5 oz 229 lb 0.9 oz 223 lb 1.6 oz  Weight (kg) 102.2 kg 103.9 kg 101.197 kg  Some encounter information is confidential and restricted. Go to Review Flowsheets activity to see all data.    Body mass index is 35.29 kg/m.  General: Well nourished, well developed, in no acute distress Head: Atraumatic, normal size  Eyes: PEERLA, EOMI  Neck: Supple, JVD 8 to 10 cm of water Endocrine: No  thryomegaly Cardiac: Normal S1, S2; RRR; no murmurs, rubs, or gallops Lungs: Clear to auscultation bilaterally, no wheezing, rhonchi or rales  Abd: Soft, nontender, no hepatomegaly  Ext: No edema, pulses 2+ Musculoskeletal: No deformities, BUE and BLE strength normal and equal Skin: Warm and dry, no rashes   Neuro: Alert and oriented to person, place, time, and situation, CNII-XII grossly intact, no focal deficits  Psych: Normal mood and affect   Labs  High Sensitivity Troponin:   Recent Labs  Lab 05/13/20 1210 05/13/20 1440  TROPONINIHS 35* 37*     Cardiac EnzymesNo results for input(s): TROPONINI in the last 168 hours. No results for input(s): TROPIPOC in the last 168 hours.  Chemistry Recent Labs  Lab 05/14/20 0350 05/15/20 0327 05/16/20 0511  NA 141 141 139  K 3.6 3.8 3.7  CL 108 107 102  CO2 26 28 33*  GLUCOSE 104* 123* 112*  BUN 28* 24* 22  CREATININE 1.54* 1.43* 1.33*  CALCIUM 9.0 8.8* 9.0  PROT 6.3*  --    --   ALBUMIN 3.2*  --   --   AST 20  --   --   ALT 22  --   --   ALKPHOS 68  --   --   BILITOT 0.8  --   --   GFRNONAA 37* 40* 44*  ANIONGAP 7 6 4*    Hematology Recent Labs  Lab 05/14/20 0350 05/15/20 0327 05/16/20 0511 05/16/20 0611  WBC 6.2 6.7 6.6  --   RBC 3.29* 3.23* 3.51* 3.47*  HGB 9.9* 9.6* 10.3*  --   HCT 31.8* 31.2* 34.0*  --   MCV 96.7 96.6 96.9  --   MCH 30.1 29.7 29.3  --   MCHC 31.1 30.8 30.3  --   RDW 16.9* 16.8* 16.6*  --   PLT 309 283 313  --    BNP Recent Labs  Lab 05/13/20 1210  BNP 2,524.0*    DDimer No results for input(s): DDIMER in the last 168 hours.   Radiology  ECHOCARDIOGRAM COMPLETE  Result Date: 05/14/2020    ECHOCARDIOGRAM REPORT   Patient Name:   ANDRES ESCANDON Mundis Date of Exam: 05/14/2020 Medical Rec #:  [id number]   Height:       67.0 in Accession #:    [id number]  Weight:       190.0 lb Date of Birth:  1950-11-08   BSA:          1.979 m Patient Age:    70 years    BP:           160/94 mmHg Patient Gender: F           HR:           64 bpm. Exam Location:  Inpatient Procedure: 2D Echo, Cardiac Doppler and Color Doppler Indications:    CHF                 Pulmonary hypertension                 Aortic stenosis  History:        Patient has prior history of Echocardiogram examinations, most                 recent 09/11/2018. Signs/Symptoms:Shortness of Breath; Risk                 Factors:Hypertension, Dyslipidemia and Former Smoker. PAD. CKD.  Sonographer:    Ross Ludwig RDCS (AE)  Referring Phys: [id number] Heide Scales  Sonographer Comments: Image acquisition challenging due to respiratory motion. Image acquisition challeing due to patient movement. IMPRESSIONS  1. Left ventricular ejection fraction, by estimation, is 30 to 35%. The left ventricle has moderately decreased function. The left ventricle demonstrates global hypokinesis. There is severe asymmetric left ventricular hypertrophy of the posterior-lateral segment. Left ventricular diastolic  parameters are consistent with Grade III diastolic dysfunction (restrictive).  2. Right ventricular systolic function is normal. The right ventricular size is normal. There is moderately elevated pulmonary artery systolic pressure. The estimated right ventricular systolic pressure is 46.6 mmHg.  3. Left atrial size was severely dilated.  4. The mitral valve is grossly normal. Mild mitral valve regurgitation. No evidence of mitral stenosis.  5. Tricuspid valve regurgitation is moderate.  6. The aortic valve is abnormal. There is severe calcifcation of the aortic valve. Aortic valve regurgitation is trivial. Moderate aortic valve stenosis. Aortic valve mean gradient measures 25.2 mmHg.  7. Aortic dilatation noted. There is borderline dilatation of the ascending aorta, measuring 39 mm.  8. The inferior vena cava is dilated in size with <50% respiratory variability, suggesting right atrial pressure of 15 mmHg.  9. Cannot exclude atrial level shunt suggested by color flow Doppler. FINDINGS  Left Ventricle: Left ventricular ejection fraction, by estimation, is 30 to 35%. The left ventricle has moderately decreased function. The left ventricle demonstrates global hypokinesis. 3D left ventricular ejection fraction analysis performed but not reported based on interpreter judgement due to suboptimal quality. The left ventricular internal cavity size was normal in size. There is severe asymmetric left ventricular hypertrophy of the posterior-lateral segment. Left ventricular diastolic parameters are consistent with Grade III diastolic dysfunction (restrictive). Right Ventricle: The right ventricular size is normal. No increase in right ventricular wall thickness. Right ventricular systolic function is normal. There is moderately elevated pulmonary artery systolic pressure. The tricuspid regurgitant velocity is 2.81 m/s, and with an assumed right atrial pressure of 15 mmHg, the estimated right ventricular systolic pressure is  46.6 mmHg. Left Atrium: Left atrial size was severely dilated. Right Atrium: Right atrial size was normal in size. Pericardium: There is no evidence of pericardial effusion. Mitral Valve: The mitral valve is grossly normal. Mild mitral annular calcification. Mild mitral valve regurgitation. No evidence of mitral valve stenosis. MV peak gradient, 4.5 mmHg. The mean mitral valve gradient is 1.0 mmHg. Tricuspid Valve: The tricuspid valve is normal in structure. Tricuspid valve regurgitation is moderate . No evidence of tricuspid stenosis. Aortic Valve: The aortic valve is abnormal. There is severe calcifcation of the aortic valve. Aortic valve regurgitation is trivial. Aortic regurgitation PHT measures 471 msec. Moderate aortic stenosis is present. Aortic valve mean gradient measures 25.2  mmHg. Aortic valve peak gradient measures 48.0 mmHg. Aortic valve area, by VTI measures 1.26 cm. Pulmonic Valve: The pulmonic valve was normal in structure. Pulmonic valve regurgitation is trivial. No evidence of pulmonic stenosis. Aorta: Aortic dilatation noted. There is borderline dilatation of the ascending aorta, measuring 39 mm. Venous: The inferior vena cava is dilated in size with less than 50% respiratory variability, suggesting right atrial pressure of 15 mmHg. IAS/Shunts: Cannot exclude atrial level shunt suggested by color flow Doppler.  LEFT VENTRICLE PLAX 2D LVIDd:         5.50 cm      Diastology LVIDs:         4.50 cm      LV e' medial:    2.61 cm/s LV PW:  1.80 cm      LV E/e' medial:  38.7 LV IVS:        1.30 cm      LV e' lateral:   3.30 cm/s LVOT diam:     2.20 cm      LV E/e' lateral: 30.6 LV SV:         93 LV SV Index:   47 LVOT Area:     3.80 cm                              3D Volume EF: LV Volumes (MOD)            3D EF:        45 % LV vol d, MOD A2C: 197.0 ml LV EDV:       282 ml LV vol d, MOD A4C: 157.0 ml LV ESV:       156 ml LV vol s, MOD A2C: 115.0 ml LV SV:        126 ml LV vol s, MOD A4C: 112.0  ml LV SV MOD A2C:     82.0 ml LV SV MOD A4C:     157.0 ml LV SV MOD BP:      65.2 ml RIGHT VENTRICLE            IVC RV Basal diam:  3.10 cm    IVC diam: 2.40 cm RV S prime:     7.83 cm/s TAPSE (M-mode): 1.9 cm LEFT ATRIUM              Index       RIGHT ATRIUM           Index LA diam:        4.60 cm  2.32 cm/m  RA Area:     17.40 cm LA Vol (A2C):   136.0 ml 68.73 ml/m RA Volume:   44.40 ml  22.44 ml/m LA Vol (A4C):   88.9 ml  44.93 ml/m LA Biplane Vol: 111.0 ml 56.10 ml/m  AORTIC VALVE AV Area (Vmax):    1.28 cm AV Area (Vmean):   1.23 cm AV Area (VTI):     1.26 cm AV Vmax:           346.50 cm/s AV Vmean:          230.000 cm/s AV VTI:            0.733 m AV Peak Grad:      48.0 mmHg AV Mean Grad:      25.2 mmHg LVOT Vmax:         117.00 cm/s LVOT Vmean:        74.700 cm/s LVOT VTI:          0.244 m LVOT/AV VTI ratio: 0.33 AI PHT:            471 msec  AORTA Ao Root diam: 3.10 cm Ao Asc diam:  3.90 cm MITRAL VALVE                 TRICUSPID VALVE MV Area (PHT): 3.12 cm      TR Peak grad:   31.6 mmHg MV Area VTI:   2.53 cm      TR Vmax:        281.00 cm/s MV Peak grad:  4.5 mmHg MV Mean grad:  1.0 mmHg      SHUNTS MV Vmax:  1.06 m/s      Systemic VTI:  0.24 m MV Vmean:      52.6 cm/s     Systemic Diam: 2.20 cm MV Decel Time: 243 msec MR Peak grad:    101.2 mmHg MR Mean grad:    65.0 mmHg MR Vmax:         503.00 cm/s MR Vmean:        378.0 cm/s MR PISA:         1.01 cm MR PISA Eff ROA: 8 mm MR PISA Radius:  0.40 cm MV E velocity: 101.00 cm/s MV A velocity: 47.60 cm/s MV E/A ratio:  2.12 Weston BrassGayatri Acharya MD Electronically signed by Weston BrassGayatri Acharya MD Signature Date/Time: 05/14/2020/9:47:11 AM    Final     Cardiac Studies  TTE 05/14/2020 1. Left ventricular ejection fraction, by estimation, is 30 to 35%. The  left ventricle has moderately decreased function. The left ventricle  demonstrates global hypokinesis. There is severe asymmetric left  ventricular hypertrophy of the  posterior-lateral segment.  Left ventricular diastolic parameters are  consistent with Grade III diastolic dysfunction (restrictive).  2. Right ventricular systolic function is normal. The right ventricular  size is normal. There is moderately elevated pulmonary artery systolic  pressure. The estimated right ventricular systolic pressure is 46.6 mmHg.  3. Left atrial size was severely dilated.  4. The mitral valve is grossly normal. Mild mitral valve regurgitation.  No evidence of mitral stenosis.  5. Tricuspid valve regurgitation is moderate.  6. The aortic valve is abnormal. There is severe calcifcation of the  aortic valve. Aortic valve regurgitation is trivial. Moderate aortic valve  stenosis. Aortic valve mean gradient measures 25.2 mmHg.  7. Aortic dilatation noted. There is borderline dilatation of the  ascending aorta, measuring 39 mm.  8. The inferior vena cava is dilated in size with <50% respiratory  variability, suggesting right atrial pressure of 15 mmHg.  9. Cannot exclude atrial level shunt suggested by color flow Doppler.   Patient Profile  Wyvonnia LoraMae H Dugdale is a 70 y.o. female with moderate nonobstructive CAD, hypertension with hypertensive heart disease, moderate aortic stenosis, paranoid schizophrenia, CKD stage III, PAD who was admitted on 05/14/2020 for acute systolic heart failure.  Assessment & Plan   1.  New onset systolic heart failure, EF 30-35%/hypertensive heart disease/medication noncompliance -Good diuresis yesterday.  Still has JVD.  1 more dose of 80 mg of IV Lasix. -Cardiomyopathy likely related to noncompliance in setting of hypertension. -She is noncompliant.  Would not recommend a left heart catheterization at this time.  I think the best option is medical management to determine where her heart function will end up.  Troponins are minimal and flat.  This is secondary to hypertensive heart disease.  Again, I do agree with the weekday cardiology team which deemed her not a great  candidate for left heart catheterization. -Continue Coreg 25 twice a day.  Increase hydralazine to 100 mg 3 times daily.  Increase Imdur to 60 mg daily.  Further titration of ARB pending kidney function.  It is improving. -Likely transition to p.o. Lasix tomorrow.  2.  Atrial tachycardia -She is having brief A. tach episodes overnight.  They are brief and nonsustained.  Continue Coreg for now.  Should she have further sustained episodes may need to transition to metoprolol succinate. -No symptoms from this.  3.  Moderate aortic stenosis. -This can be followed as an outpatient.  For questions or updates, please contact [facility] Please consult  [email or web address] for contact info under   Time Spent with Patient: I have spent a total of 25 minutes with patient reviewing hospital notes, telemetry, EKGs, labs and examining the patient as well as establishing an assessment and plan that was discussed with the patient.  > 50% of time was spent in direct patient care.    Signed, Lenna Gilford. Flora Lipps, MD, [facility]  [facility]  05/16/2020 8:59 AM

## 2020-05-16 NOTE — Progress Notes (Signed)
HD#2 Subjective:  Overnight Events: no events over night.    Patient resting comfortably in bed.  She states that a lot of her symptoms have resolved.  She is breathing better and feels as though her lower extremity edema has improved.  Otherwise she denies any acute complaints today  Objective:  Vital signs in last 24 hours: Vitals:   05/15/20 2157 05/15/20 2300 05/16/20 0453 05/16/20 0500  BP: (!) 154/80 127/64 (!) 159/97   Pulse: 70 71 71   Resp: 16 16 15    Temp: 97.8 F (36.6 C)  98 F (36.7 C)   TempSrc:      SpO2: 94%  93%   Weight:    102.2 kg  Height:       Supplemental O2: Room Air SpO2: 93 % O2 Flow Rate (L/min): 2 L/min   Physical Exam:  Physical Exam Constitutional:      Appearance: Normal appearance.  HENT:     Head: Normocephalic and atraumatic.  Eyes:     Extraocular Movements: Extraocular movements intact.  Cardiovascular:     Rate and Rhythm: Normal rate.     Pulses: Normal pulses.     Heart sounds: Normal heart sounds.  Pulmonary:     Effort: Pulmonary effort is normal.     Breath sounds: Normal breath sounds.  Abdominal:     General: Bowel sounds are normal.     Palpations: Abdomen is soft.     Tenderness: There is no abdominal tenderness.  Musculoskeletal:        General: Normal range of motion.     Cervical back: Normal range of motion.     Right lower leg: No edema.     Left lower leg: No edema.  Skin:    General: Skin is warm and dry.  Neurological:     Mental Status: She is alert and oriented to person, place, and time. Mental status is at baseline.  Psychiatric:        Mood and Affect: Mood normal.     Filed Weights   05/14/20 1208 05/15/20 0500 05/16/20 0500  Weight: 101.2 kg 103.9 kg 102.2 kg     Intake/Output Summary (Last 24 hours) at 05/16/2020 1147 Last data filed at 05/16/2020 1140 Gross per 24 hour  Intake 1920 ml  Output 6675 ml  Net -4755 ml   Net IO Since Admission: -5,865 mL [05/16/20 1147]  Recent  Labs    05/14/20 0655 05/15/20 0620 05/16/20 0450  GLUCAP 101* 83 140*     Pertinent Labs: CBC Latest Ref Rng & Units 05/16/2020 05/15/2020 05/14/2020  WBC 4.0 - 10.5 K/uL 6.6 6.7 6.2  Hemoglobin 12.0 - 15.0 g/dL 10.3(L) 9.6(L) 9.9(L)  Hematocrit 36.0 - 46.0 % 34.0(L) 31.2(L) 31.8(L)  Platelets 150 - 400 K/uL 313 283 309    CMP Latest Ref Rng & Units 05/16/2020 05/15/2020 05/14/2020  Glucose 70 - 99 mg/dL 07/14/2020) 542(H) 062(B)  BUN 8 - 23 mg/dL 22 762(G) 31(D)  Creatinine 0.44 - 1.00 mg/dL 17(O) 1.60(V) 3.71(G)  Sodium 135 - 145 mmol/L 139 141 141  Potassium 3.5 - 5.1 mmol/L 3.7 3.8 3.6  Chloride 98 - 111 mmol/L 102 107 108  CO2 22 - 32 mmol/L 33(H) 28 26  Calcium 8.9 - 10.3 mg/dL 9.0 6.26(R) 9.0  Total Protein 6.5 - 8.1 g/dL - - 6.3(L)  Total Bilirubin 0.3 - 1.2 mg/dL - - 0.8  Alkaline Phos 38 - 126 U/L - - 68  AST 15 -  41 U/L - - 20  ALT 0 - 44 U/L - - 22    Imaging: No results found.  Assessment/Plan:   Active Problems:   Severe uncontrolled hypertension   Acute heart failure (HCC)   Elevated troponin   SOB (shortness of breath)   Severe, Symptomatic HTN Patient continues to have elevated blood pressures. - Appreciate cardiologies assistance with this patient. -Increase hydralazine 100 mg TID and Imdur 60 mg daily. -Continue Coreg to 25 mg BID and Losartan to 50 mg daily -We will continue to assess patient's response to therapy. - Goal: normotensive.   Acute Systolic Heart Failure She continues to have good diuresis.  Net -3.2 L overnight. - Continue Lasix 80 mg daily and transition to oral tomorrow -Continue losartan 50 mg, Coreg 25 mg twice daily losartan 50 mg daily, and Imdur 60 mg daily -Strict I's and O's -Fluid and salt restricted diet  Moderate AS, TR, and mild MR Aortic valve mean gradient 25.2 mmHg today.  -We will need a repeat echo in the outpatient setting in several months   AKI on CKD III Patient's creatinine continues to improve with a  creatinine of 1.33 and a GFR of 44.  She continues to have good urine output.   -We will continue to monitor renal function daily  -Replete electrolytes as needed  -Avoid nephrotoxic medications when possible .  Paranoid Schizophrenia  Patient's symptoms seem to be improving on her olanzapine 5 mg twice daily.  Will likely need titration in the outpatient setting -Appreciate psychiatry's assistance -Continue olanzapine 5 mg twice daily -Appreciate TSE's assistance with housing   Acute hypoproliferative, normocytic Anemia, iron deficiency -Patient's reticulocyte count was 1.62 indicating a hypoproliferative anemia. -Ferritin of 86.  Goal greater than 100 and patients with active heart failure with reduced ejection fraction - Continue p.o. ferrous gluconate daily -Monitor hemoglobin daily   Prior to Admission Living Arrangement:Hotel, Unstable Housing Situation Anticipated Discharge Location:Group home or ACT team Barriers to Discharge:Continued Medical Therapy   Dispo: Anticipated discharge to Pending in 2 to 3 days days pending improvement of her heart failure and control of her schizophrenia.   Chari Manning, D.O.  Internal Medicine Resident, PGY-2 [name]  Pager: [phone] 11:47 AM, 05/16/2020   Please contact the on call pager after 5 pm and on weekends at [phone].

## 2020-05-17 DIAGNOSIS — I5043 Acute on chronic combined systolic (congestive) and diastolic (congestive) heart failure: Secondary | ICD-10-CM

## 2020-05-17 DIAGNOSIS — J9622 Acute and chronic respiratory failure with hypercapnia: Secondary | ICD-10-CM

## 2020-05-17 DIAGNOSIS — N170 Acute kidney failure with tubular necrosis: Secondary | ICD-10-CM

## 2020-05-17 DIAGNOSIS — D509 Iron deficiency anemia, unspecified: Secondary | ICD-10-CM

## 2020-05-17 DIAGNOSIS — I169 Hypertensive crisis, unspecified: Secondary | ICD-10-CM

## 2020-05-17 DIAGNOSIS — N1831 Chronic kidney disease, stage 3a: Secondary | ICD-10-CM

## 2020-05-17 DIAGNOSIS — J9621 Acute and chronic respiratory failure with hypoxia: Secondary | ICD-10-CM

## 2020-05-17 DIAGNOSIS — R778 Other specified abnormalities of plasma proteins: Secondary | ICD-10-CM

## 2020-05-17 DIAGNOSIS — I471 Supraventricular tachycardia: Secondary | ICD-10-CM

## 2020-05-17 LAB — BASIC METABOLIC PANEL
Anion gap: 4 — ABNORMAL LOW (ref 5–15)
BUN: 26 mg/dL — ABNORMAL HIGH (ref 8–23)
CO2: 34 mmol/L — ABNORMAL HIGH (ref 22–32)
Calcium: 8.9 mg/dL (ref 8.9–10.3)
Chloride: 98 mmol/L (ref 98–111)
Creatinine, Ser: 1.78 mg/dL — ABNORMAL HIGH (ref 0.44–1.00)
GFR, Estimated: 31 mL/min — ABNORMAL LOW (ref >60)
Glucose, Bld: 107 mg/dL — ABNORMAL HIGH (ref 70–99)
Potassium: 4 mmol/L (ref 3.5–5.1)
Sodium: 136 mmol/L (ref 135–145)

## 2020-05-17 LAB — GLUCOSE, CAPILLARY: Glucose-Capillary: 96 mg/dL (ref 70–99)

## 2020-05-17 MED ORDER — OLANZAPINE 5 MG PO TABS
10.0000 mg | ORAL_TABLET | Freq: Every day | ORAL | Status: DC
  Administered 2020-05-17: 10 mg via ORAL
  Filled 2020-05-17: qty 2

## 2020-05-17 MED ORDER — OLANZAPINE 5 MG PO TABS
5.0000 mg | ORAL_TABLET | Freq: Every day | ORAL | Status: DC
  Administered 2020-05-18: 5 mg via ORAL
  Filled 2020-05-17: qty 1

## 2020-05-17 MED ORDER — ASPIRIN EC 81 MG PO TBEC
81.0000 mg | DELAYED_RELEASE_TABLET | Freq: Every day | ORAL | Status: DC
  Administered 2020-05-17 – 2020-05-20 (×4): 81 mg via ORAL
  Filled 2020-05-17 (×4): qty 1

## 2020-05-17 NOTE — Progress Notes (Signed)
Occupational Therapy Treatment Patient Details Name: Erin Riley MRN: [id number] DOB: [birth year] Today's Date: 05/17/2020    History of present illness Erin Riley is a 70 y.o. female admitted for  evaluation of CHF.  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   OT comments  Patient supine in bed upon entry, lethargic initially but improves with session.  Patient completing transfers and ADLs with modified independence, some decreased safety awareness but anticipate this is near baseline.  Session limited due to poor participation, patient engaging in self care hygiene only before laying back down in bed.  Will follow acutely to increase tolerance to activity.  Continue plan of HHOT services.    Follow Up Recommendations  Home health OT    Equipment Recommendations  3 in 1 bedside commode    Recommendations for Other Services      Precautions / Restrictions Precautions Precautions: Fall Restrictions Weight Bearing Restrictions: No       Mobility Bed Mobility Overal bed mobility: Independent                  Transfers Overall transfer level: Needs assistance Equipment used: None Transfers: Sit to/from Stand Sit to Stand: Modified independent (Device/Increase time)         General transfer comment: no assist required    Balance Overall balance assessment: Needs assistance Sitting-balance support: No upper extremity supported;Feet supported Sitting balance-Leahy Scale: Normal     Standing balance support: No upper extremity supported;During functional activity Standing balance-Leahy Scale: Fair                             ADL either performed or assessed with clinical judgement   ADL Overall ADL's : Modified independent                                        General ADL Comments: no assist required, pt ambulating to sink to wet washcloth for peri hyiene standing at EOB     Vision       Perception     Praxis      Cognition Arousal/Alertness: Lethargic Behavior During Therapy: WFL for tasks assessed/performed Overall Cognitive Status: History of cognitive impairments - at baseline                                 General Comments: hx of paranoid pchizophrenia-pt lethargic but improved with engagement, easily agitated and reports frustration with basic orientation questions, although had to use her phone to figure out the day. Reprots "I've been doing this my whole life".        Exercises     Shoulder Instructions       General Comments limited session, patient hesitant to engage with therapist    Pertinent Vitals/ Pain       Pain Assessment: Faces Faces Pain Scale: No hurt  Home Living                                          Prior Functioning/Environment  Frequency  Min 2X/week        Progress Toward Goals  OT Goals(current goals can now be found in the care plan section)  Progress towards OT goals: Progressing toward goals  Acute Rehab OT Goals Patient Stated Goal: to go home OT Goal Formulation: With patient  Plan Discharge plan remains appropriate;Frequency remains appropriate    Co-evaluation                 AM-PAC OT "6 Clicks" Daily Activity     Outcome Measure   Help from another person eating meals?: None Help from another person taking care of personal grooming?: None Help from another person toileting, which includes using toliet, bedpan, or urinal?: None Help from another person bathing (including washing, rinsing, drying)?: None Help from another person to put on and taking off regular upper body clothing?: None Help from another person to put on and taking off regular lower body clothing?: None 6 Click Score: 24    End of Session     OT Visit Diagnosis: Unsteadiness on feet (R26.81)   Activity Tolerance Patient limited by lethargy   Patient Left in bed;with call bell/phone within reach   Nurse Communication Mobility status;Other (comment) (pill found in bed, on sink)        Time: 5643-3295 OT Time Calculation (min): 21 min  Charges: OT General Charges $OT Visit: 1 Visit OT Treatments $Self Care/Home Management : 8-22 mins  Barry Brunner, OT Acute Rehabilitation Services Pager [phone] Office [phone]    Chancy Milroy 05/17/2020, 11:12 AM
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
## 2020-05-17 NOTE — Progress Notes (Signed)
Subjective:   Erin Riley states she is doing "fine" without any complaints. Denies shortness of breath, chest pain, swelling of her legs or abdomen. When asked where she may go after her hospital stay, she says "get out" with frustration, wishing against further discussion.   Objective:  Vital signs in last 24 hours: Vitals:   05/16/20 2054 05/17/20 0459 05/17/20 0500 05/17/20 1016  BP: 123/69 121/74  (!) 105/58  Pulse: 67 74  62  Resp: 18 18  20   Temp: 98.4 F (36.9 C) 98.3 F (36.8 C)  98.2 F (36.8 C)  TempSrc:    Oral  SpO2: 97% 91%  95%  Weight: 102.2 kg  100.8 kg   Height:       General: Patient appears tired but comfortable, in no acute distress Respiratory: Breathing comfortably on room air.  Cardiovascular: RRR. There is a 4/6 SEM heard in the right upper chest. There is a 3/6 blowing holosystolic murmur along the left lower sternal border and 2/6 murmur present in the right lower chest. No lower extremity pitting edema. Unable to assess for JVD. Abdominal: Soft and non-tender to palpation. No rebound or guarding. Psych: Patient is agitated with pressured speech.   Assessment/Plan:  Active Problems:   Severe uncontrolled hypertension   Acute heart failure (HCC)   Elevated troponin   SOB (shortness of breath)  New Onset Systolic Heart Failure (EF 30-35%)  Continues to diurese well with -3.4L UOP since yesterday s/p Lasix 80mg  IV; however, she has had acute bump in creatinine since yesterday. Cardiology and heart failure pharmacist following, appreciate their recommendations.  - Patient is not a good candidate for LHC  - Continue coreg 25mg  BID  - Continue hydralazine 100mg  TID  - Continue Imdur 60mg  dialy  - Continue Losartan 50mg  daily  - Plan to hold off on PO lasix and up-titration of Losartan in setting of worsening renal function - Strict I&O  - Daily weights  Severe Symptomatic Hypertension  Blood pressure is now under much better control on below  regimen. Will need close monitoring for hypotension in setting of multiple recent medication changes.   - Continue Losartan 50mg  daily - Continue Hydralazine 100mg  TID - Continue Imdur 60mg  daily  - Continue Coreg 25mg  BID   Moderate AS, TR, and mild MR - Can be monitored outpatient   AKI on CKD III Creatinine increased from 1.33 to 1.78 with acute decrease in GFR to 31 since yesterday. Likely in setting of over-diuresis.   - Plan to hold Lasix today - Continue to monitor daily renal function   Paranoid Schizophrenia  Paranoia seems to be improving with olanzapine 5mg  twice daily; however, patient became acutely frustrated with possible paranoia on examination this morning. Concern her heart failure exacerbation is in setting of poor medication compliance.  - Continue olanzapine 5mg  every morning  - Increase evening olanzapine dose to 10mg   - Will discuss disposition with SW   Atrial Tachycardia, Resolved Acute Hypoxic Respiratory Failure, Resolved Likely in setting of volume overload.   - Check repeat EKG given new ST segment depressions noted in V2   - Continuous telemetry - Consider switching Coreg to Metoprolol succinate if atrial tachycardia recurs   Moderate AS, TR, and mild MR Acute Normocytic Anemia w/ IDA Stable.  - Continue monitoring outpatient   Prior to Admission Living Arrangement: Hotel, Unstable Housing Situation Anticipated Discharge Location: Group home or ACT team Barriers to Discharge: Continued Medical Therapy / Placement   ,  MD 05/17/2020, 11:40 AM Pager: [phone] After 5pm on weekdays and 1pm on weekends: On Call pager [phone]

## 2020-05-17 NOTE — Progress Notes (Addendum)
Progress Note  Patient Name: Erin Riley Date of Encounter: 05/17/2020  [facility] Cardiologist: Erin Lemma, MD   Subjective   Sleeping in bed. No complaints of chest pain or SOB today.   Inpatient Medications    Scheduled Meds: . atorvastatin  40 mg Oral Daily  . carvedilol  25 mg Oral BID WC  . enoxaparin (LOVENOX) injection  40 mg Subcutaneous Q24H  . ferrous gluconate  324 mg Oral Q breakfast  . folic acid  1 mg Oral Daily  . hydrALAZINE  100 mg Oral Q8H  . isosorbide mononitrate  60 mg Oral Daily  . losartan  50 mg Oral Daily  . multivitamin with minerals  1 tablet Oral Daily  . OLANZapine  10 mg Oral QHS  . [START ON 05/18/2020] OLANZapine  5 mg Oral Daily  . sodium chloride flush  3 mL Intravenous Q12H  . thiamine  100 mg Oral Daily   Continuous Infusions:  PRN Meds: acetaminophen **OR** acetaminophen, ondansetron **OR** ondansetron (ZOFRAN) IV, senna   Vital Signs    Vitals:   05/17/20 0459 05/17/20 0500 05/17/20 1016 05/17/20 1353  BP: 121/74  (!) 105/58 108/77  Pulse: 74  62   Resp: 18  20   Temp: 98.3 F (36.8 C)  98.2 F (36.8 C)   TempSrc:   Oral   SpO2: 91%  95%   Weight:  100.8 kg    Height:        Intake/Output Summary (Last 24 hours) at 05/17/2020 1518 Last data filed at 05/17/2020 1447 Gross per 24 hour  Intake 1200 ml  Output 1300 ml  Net -100 ml   Last 3 Weights 05/17/2020 05/16/2020 05/16/2020  Weight (lbs) 222 lb 3.6 oz 225 lb 5.1 oz 225 lb 5 oz  Weight (kg) 100.8 kg 102.204 kg 102.2 kg  Some encounter information is confidential and restricted. Go to Review Flowsheets activity to see all data.      Telemetry    Not currently on telemetry  - Personally Reviewed  ECG    No new tracings - Personally Reviewed  Physical Exam   GEN: Sleeping in bed in no acute distress.   Neck: No JVD Cardiac: RRR, no murmurs, rubs, or gallops.  Respiratory: Clear to auscultation bilaterally. GI: Soft, nontender, non-distended  MS: No  edema; No deformity. Neuro:  Nonfocal  Psych: Normal affect   Labs    High Sensitivity Troponin:   Recent Labs  Lab 05/13/20 1210 05/13/20 1440  TROPONINIHS 35* 37*      Chemistry Recent Labs  Lab 05/14/20 0350 05/15/20 0327 05/16/20 0511 05/17/20 0649  NA 141 141 139 136  K 3.6 3.8 3.7 4.0  CL 108 107 102 98  CO2 26 28 33* 34*  GLUCOSE 104* 123* 112* 107*  BUN 28* 24* 22 26*  CREATININE 1.54* 1.43* 1.33* 1.78*  CALCIUM 9.0 8.8* 9.0 8.9  PROT 6.3*  --   --   --   ALBUMIN 3.2*  --   --   --   AST 20  --   --   --   ALT 22  --   --   --   ALKPHOS 68  --   --   --   BILITOT 0.8  --   --   --   GFRNONAA 37* 40* 44* 31*  ANIONGAP 7 6 4* 4*     Hematology Recent Labs  Lab 05/14/20 0350 05/15/20 0327 05/16/20 0511 05/16/20  0611  WBC 6.2 6.7 6.6  --   RBC 3.29* 3.23* 3.51* 3.47*  HGB 9.9* 9.6* 10.3*  --   HCT 31.8* 31.2* 34.0*  --   MCV 96.7 96.6 96.9  --   MCH 30.1 29.7 29.3  --   MCHC 31.1 30.8 30.3  --   RDW 16.9* 16.8* 16.6*  --   PLT 309 283 313  --     BNP Recent Labs  Lab 05/13/20 1210  BNP 2,524.0*     DDimer No results for input(s): DDIMER in the last 168 hours.   Radiology    No results found.  Cardiac Studies   TTE 05/14/2020 1. Left ventricular ejection fraction, by estimation, is 30 to 35%. The  left ventricle has moderately decreased function. The left ventricle  demonstrates global hypokinesis. There is severe asymmetric left  ventricular hypertrophy of the  posterior-lateral segment. Left ventricular diastolic parameters are  consistent with Grade III diastolic dysfunction (restrictive).  2. Right ventricular systolic function is normal. The right ventricular  size is normal. There is moderately elevated pulmonary artery systolic  pressure. The estimated right ventricular systolic pressure is 46.6 mmHg.  3. Left atrial size was severely dilated.  4. The mitral valve is grossly normal. Mild mitral valve regurgitation.  No  evidence of mitral stenosis.  5. Tricuspid valve regurgitation is moderate.  6. The aortic valve is abnormal. There is severe calcifcation of the  aortic valve. Aortic valve regurgitation is trivial. Moderate aortic valve  stenosis. Aortic valve mean gradient measures 25.2 mmHg.  7. Aortic dilatation noted. There is borderline dilatation of the  ascending aorta, measuring 39 mm.  8. The inferior vena cava is dilated in size with <50% respiratory  variability, suggesting right atrial pressure of 15 mmHg.  9. Cannot exclude atrial level shunt suggested by color flow Doppler.   Patient Profile     70 y.o. female with a PMH of moderate non-obstructive CAD, PAD, HTN, aortic stenosis, paranoid schizophrenia, and CKD stage 3, who is being followed by cardiology for acute combined CHF.   Assessment & Plan    1. Acute combined CHF: patient presented with SOB and LE edema. Echo showed EF 30-35%, severe asymmetric LVH of the posterior-lateral segment, G3DD, severe LAE, mild-moderate MR, and moderate AS. 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 Felt to be a poor candidate for ischemic evaluation given non-compliance She was diuresed with IV lasix. Received 80mg  IV x2 yesterday with bump in Cr today from 1.33>1.78. Lasix on hold. She is net -1.9L in the past 24 hours and -6.5L this admission. She appears euvolemic on exam - Consider po lasix tomorrow if Cr returns to baseline - Continue carvedilol and losartan (would hold losartan dose in the a.m., if Cr not improved tomorrow) - Continue to monitor strict I&Os and daily weights - Continue to monitor electrolytes closely and replete to maintain K >4, Mg >2.   2. HTN: poorly controlled on admission in the setting of medication non-compliance, now improved. Likely contributing to #1.  - Continue carvedilol, imdur, and losartan-at current doses, although now her pressures have dropped some and we may  need to monitor closely.  Hydralazine 100 mg 3 times daily as ordered, based on her lack of compliance in the past, there is no way she will take 3 times a day medication.  We will simply convert to twice daily..   3. CAD: known moderate non-obstructive disease. - Continue statin -  Would consider starting aspirin 81mg  daily - Continue BBlocker and imdur  4. Aortic stenosis: moderate on echo this admission - Continue routine outpatient monitoring  5. HLD: LDL 56 this admission.  - Continue atorvastatin  6. AoCKD stage 3: Cr bumped to 1.78 today from 1.33 yesterday. Lasix held today. - Continue to monitor closely        Signed, , PA-C  05/17/2020, 3:18 PM     ATTENDING ATTESTATION  I have seen, examined and evaluated the patient this PM along with 05/19/2020, PA-C.  After reviewing all the available data and chart, we discussed the patients laboratory, study & physical findings as well as symptoms in detail. I agree with her findings, examination as well as impression recommendations as per our discussion.    Attending adjustments noted in italics.   Finally diuresed significantly and blood pressures are improving.  At this point, her blood pressure is actually dropping down.  I will change her hydralazine to twice daily and hold tonight's dose.  Reassess renal function the morning and if necessary hold losartan.  Agree with holding diuretic for now, and probably wait 1 more day to start oral Lasix. Need to make sure that she does okay on oral Lasix.   Judy Pimple, M.D., M.S. Interventional Cardiologist   Pager # [phone] Phone # [phone] [address] [place], [place]    For questions or updates, please contact [facility] Please consult [email or web address] for contact info under

## 2020-05-17 NOTE — Progress Notes (Signed)
Heart Failure Stewardship Pharmacist Progress Note   PCP: Pcp, No PCP-Cardiologist: Bryan Lemma, MD    HPI:  70 yo F with PMH of CAD, HTN, aortic stenosis, paranoid schizophrenia, CKD III, HLD, and PVD. She presented to the ED on 05/13/20 BLE edema and shortness of breath. She was admitted for HTN urgency, and acute on chronic diastolic CHF exacerbation. An ECHO was done on 05/14/20 and LVEF was 30-35%. Notably noncompliant with medications PTA. She states she lives alone at a motel.  Current HF Medications: Carvedilol 25 mg BID Losartan 50 mg daily Hydralazine 100 mg q8h Imdur 60 mg daily  Prior to admission HF Medications: None  Pertinent Lab Values: . Serum creatinine 1.33>1.78, BUN 26, Potassium 4.0, Sodium 136, BNP 2524.0, Magnesium 1.9  Vital Signs: . Weight: 222 lbs (admission weight: 223 lbs) . Blood pressure: 100-120/60s  . Heart rate: 60s   Medication Assistance / Insurance Benefits Check: Does the patient have prescription insurance?  Yes Type of insurance plan: Medicare + Medicaid  Outpatient Pharmacy:  Prior to admission outpatient pharmacy: [facility] Is the patient willing to use [facility] TOC pharmacy at discharge? Yes Is the patient willing to transition their outpatient pharmacy to utilize a [facility] outpatient pharmacy?   Pending    Assessment: 1. 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 NYHA class II symptoms. - Potentially transitioning to PO lasix today per cardiology - Continue carvedilol 25 mg BID - Continue losartan 50 mg daily - Consider starting spironolactone prior to discharge pending improvement in SCr  - Continue hydralazine 100 mg TID - Continue Imdur 60 mg daily   Plan: 1) Medication changes recommended at this time: - Continue current regimen; SCr bump today 1.33>1.78 - no room to further titrate losartan or add spironolactone  - Diuretic plan per cardiology - may need to hold off PO lasix today given renal  dysfunction  2) Patient assistance: - High risk for continued medication non-compliance at home - Disposition: group home vs nursing facility   Sharen Hones, PharmD, BCPS Heart Failure Stewardship Pharmacist Phone [phone]

## 2020-05-17 NOTE — Progress Notes (Signed)
Physical Therapy Treatment Patient Details Name: Erin Riley MRN: [id number] DOB: Mar 21, 1950 Today's Date: 05/17/2020    History of Present Illness GALE KLAR is a 70 y.o. female admitted for  evaluation of CHF.  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    PT Comments    Continuing work on functional moiblity and activity tolerance; Initially not engaged in session, but was aroused by tactile stimulation and diversion of attention to lines and leads. Was able to get to edge of bed independently, and was moved to sitting in rollator with supervision. Movement was impulsive; Lots of encouragement to walk, and pt eventually agreed to walk in room around bed -- she again got up impulsively, did not use the rollator, and Student PT gaurded as pt ambulated around edge of bed, tending to reach out to furniture for UE support; Declined further  ambulation outside of room, but maintained a positive affect. Was shown  rollator device use, and stated wanted to ambulate following day. DBP was elevated at 108/94 at end of treatment. Pt ended session supine in bed.    Follow Up Recommendations  Home health PT;Supervision - Intermittent     Equipment Recommendations  Other (comment) (Rollator RW)    Recommendations for Other Services       Precautions / Restrictions Precautions Precautions: Fall Restrictions Weight Bearing Restrictions: No    Mobility  Bed Mobility Overal bed mobility: Independent                  Transfers Overall transfer level: Needs assistance Equipment used: 4-wheeled walker Transfers: Sit to/from Stand Sit to Stand: Supervision Stand pivot transfers: Min guard;Min assist       General transfer comment: No physical assist required, but close guard for safety due to impulsivity; no  appreciable DOE noted; performed 2 stand pivot transfers bed to sit in rollator -- did not set the brakes during second transfer, and needed min assist for safety and to steady rollator  Ambulation/Gait Ambulation/Gait assistance: Min guard Gait Distance (Feet): 6 Feet (around bed) Assistive device: None (Reaching out for UE support)       General Gait Details: Got up to walk around the bed impulsivley, and did not use the rollator; trunk flexed posture as she was reaching out for UE support   Stairs             Wheelchair Mobility    Modified Rankin (Stroke Patients Only)       Balance Overall balance assessment: Needs assistance Sitting-balance support: No upper extremity supported;Feet supported Sitting balance-Leahy Scale: Normal     Standing balance support: No upper extremity supported;During functional activity Standing balance-Leahy Scale: Fair                              Cognition Arousal/Alertness: Awake/alert (Initially sleepy, but woke up more with interaction) Behavior During Therapy: WFL for tasks assessed/performed;Impulsive Overall Cognitive Status: History of cognitive impairments - at baseline Area of Impairment: Safety/judgement                         Exercises      General Comments General comments (skin integrity, edema, etc.): O2 sats ranged greater tahn or equal to 90% on room air; HR in the 60s at end of session  Pertinent Vitals/Pain Pain Assessment: No/denies pain Faces Pain Scale: No hurt    Home Living                      Prior Function            PT Goals (current goals can now be found in the care plan section) Acute Rehab PT Goals Patient Stated Goal: to go home PT Goal Formulation: With patient Time For Goal Achievement: 05/28/20 Potential to Achieve Goals: Good Progress towards PT goals: Progressing toward goals (slowly)    Frequency    Min 3X/week      PT Plan Current  plan remains appropriate    Co-evaluation              AM-PAC PT "6 Clicks" Mobility   Outcome Measure  Help needed turning from your back to your side while in a flat bed without using bedrails?: None Help needed moving from lying on your back to sitting on the side of a flat bed without using bedrails?: None Help needed moving to and from a bed to a chair (including a wheelchair)?: A Little Help needed standing up from a chair using your arms (e.g., wheelchair or bedside chair)?: A Little Help needed to walk in hospital room?: A Little Help needed climbing 3-5 steps with a railing? : A Little 6 Click Score: 20    End of Session Equipment Utilized During Treatment: Other (comment) (moved too impulsively to get a gait belt on) Activity Tolerance: Patient tolerated treatment well Patient left: in bed;with call bell/phone within reach;with bed alarm set Nurse Communication: Mobility status PT Visit Diagnosis: Unsteadiness on feet (R26.81);Muscle weakness (generalized) (M62.81)     Time: 8110-3159 PT Time Calculation (min) (ACUTE ONLY): 27 min  Charges:  $Therapeutic Activity: 23-37 mins                     Van Clines,   Acute Rehabilitation Services Pager [phone] Office [phone]    Levi Aland 05/17/2020, 2:31 PM

## 2020-05-18 ENCOUNTER — Inpatient Hospital Stay (HOSPITAL_COMMUNITY): Payer: Medicare Other

## 2020-05-18 DIAGNOSIS — F200 Paranoid schizophrenia: Secondary | ICD-10-CM | POA: Diagnosis not present

## 2020-05-18 DIAGNOSIS — I10 Essential (primary) hypertension: Secondary | ICD-10-CM

## 2020-05-18 DIAGNOSIS — Z9119 Patient's noncompliance with other medical treatment and regimen: Secondary | ICD-10-CM

## 2020-05-18 DIAGNOSIS — I119 Hypertensive heart disease without heart failure: Secondary | ICD-10-CM

## 2020-05-18 DIAGNOSIS — N1832 Chronic kidney disease, stage 3b: Secondary | ICD-10-CM

## 2020-05-18 DIAGNOSIS — Z008 Encounter for other general examination: Secondary | ICD-10-CM

## 2020-05-18 LAB — CBC
HCT: 35.2 % — ABNORMAL LOW (ref 36.0–46.0)
Hemoglobin: 10.6 g/dL — ABNORMAL LOW (ref 12.0–15.0)
MCH: 28.8 pg (ref 26.0–34.0)
MCHC: 30.1 g/dL (ref 30.0–36.0)
MCV: 95.7 fL (ref 80.0–100.0)
Platelets: 352 10*3/uL (ref 150–400)
RBC: 3.68 MIL/uL — ABNORMAL LOW (ref 3.87–5.11)
RDW: 16.1 % — ABNORMAL HIGH (ref 11.5–15.5)
WBC: 6.8 10*3/uL (ref 4.0–10.5)
nRBC: 0 % (ref 0.0–0.2)

## 2020-05-18 LAB — BASIC METABOLIC PANEL
Anion gap: 6 (ref 5–15)
BUN: 33 mg/dL — ABNORMAL HIGH (ref 8–23)
CO2: 32 mmol/L (ref 22–32)
Calcium: 8.9 mg/dL (ref 8.9–10.3)
Chloride: 98 mmol/L (ref 98–111)
Creatinine, Ser: 1.86 mg/dL — ABNORMAL HIGH (ref 0.44–1.00)
GFR, Estimated: 29 mL/min — ABNORMAL LOW (ref >60)
Glucose, Bld: 93 mg/dL (ref 70–99)
Potassium: 4 mmol/L (ref 3.5–5.1)
Sodium: 136 mmol/L (ref 135–145)

## 2020-05-18 LAB — MAGNESIUM: Magnesium: 2.3 mg/dL (ref 1.7–2.4)

## 2020-05-18 LAB — GLUCOSE, CAPILLARY: Glucose-Capillary: 103 mg/dL — ABNORMAL HIGH (ref 70–99)

## 2020-05-18 LAB — FOLATE: Folate: 46.4 ng/mL (ref >5.9)

## 2020-05-18 LAB — VITAMIN B12: Vitamin B-12: 348 pg/mL (ref 180–914)

## 2020-05-18 MED ORDER — OLANZAPINE 5 MG PO TABS
10.0000 mg | ORAL_TABLET | Freq: Two times a day (BID) | ORAL | Status: DC
  Administered 2020-05-18 – 2020-05-19 (×2): 10 mg via ORAL
  Filled 2020-05-18 (×2): qty 2

## 2020-05-18 MED ORDER — OLANZAPINE 5 MG PO TABS
5.0000 mg | ORAL_TABLET | Freq: Once | ORAL | Status: AC
  Administered 2020-05-18: 5 mg via ORAL
  Filled 2020-05-18: qty 1

## 2020-05-18 MED ORDER — HYDRALAZINE HCL 50 MG PO TABS
100.0000 mg | ORAL_TABLET | Freq: Two times a day (BID) | ORAL | Status: DC
  Administered 2020-05-18 – 2020-05-20 (×3): 100 mg via ORAL
  Filled 2020-05-18 (×4): qty 2

## 2020-05-18 NOTE — TOC Progression Note (Signed)
Transition of Care [facility]) - Progression Note    Patient Details  Name: CELINES FEMIA MRN: [id number] Date of Birth: 06-16-1950  [facility] CM/SW Contact  Okey Dupre Lazaro Arms, LCSW Phone Number: 05/18/2020, 6:52 PM  Clinical Narrative:  Boykin Reaper with [facility] regarding referring patient the the ACT program. CSW informed that they are not accepting referrals at this time as they are past capacity for accepting new participants. CSW advised by Willaim Sheng that [facility] may be an option for patient and phone number provided - [phone] and CSW was advised to ask for [place]. Call made to [facility] and was advised that Judeth Cornfield was not available. CSW will attempt to reach [place] this week.     Talked with patient at the bedside regarding her living situation. Ms. Spadoni was pleasant and very tangential in her conversation. Patient reported that she has been staying at the [facility] and currently has a room there. CSW contacted that [employer] (whiile in room with patient) and confirmed that she has a room there. MD contacted and informed.    Expected Discharge Plan: Home w Home Health Services (If patient accepts [facility] services) Barriers to Discharge: Continued Medical Work up  Expected Discharge Plan and Services Expected Discharge Plan: Home w Home Health Services (If patient accepts [facility] services) In-house Referral: Clinical Social Work   Post Acute Care Choice: Home Health [facility] services will be discussed with patient) Living arrangements for the past 2 months: Single Family Home (When asked, patient reported that she lives in her mother's home. Unsure of her living situation.)                                      Social Determinants of Health (SDOH) Interventions  Patient could benefit from mental health services   Readmission Risk Interventions No flowsheet data found.

## 2020-05-18 NOTE — Consult Note (Addendum)
[facility] Face-to-Face Psychiatry Consult   Reason for Consult: Agitation and evaluation of decision-making capacity  Referring Physician: Debe Coder, MD  Patient Identification: Erin Riley MRN:  [id number] Principal Diagnosis: <principal problem not specified> Diagnosis:  Active Problems:   Severe uncontrolled hypertension   Acute heart failure (HCC)   Elevated troponin   SOB (shortness of breath)   Total Time spent with patient: 25 minutes  HPI:   Erin Riley is a 70 y.o. female patient with PMHx of hypertensive hypertrophic cardiomyopathy, aortic stenosis, subclavian artery stenosis, peripheral artery disease, CKD stage III, hypertension, hyperlipidemia, schizophrenia and lumbar spinal fusion who presented to [facility] ED due to worsening shortness of breath and bilateral lower extremity swelling and she is being treated for systolic heart failure, tachycardia and moderate aortic stenosis and is improving.  Psychiatry is consulted as patient was agitated yesterday and to evaluate decision-making capacity of patient. Today patient denies any suicidal or homicidal ideations.  She denies any auditory or visual hallucinations and states she saw something couple of days.  She states she is in hospital because she had lots of swelling in her legs.  She states she had to come to hospital to take some pills to get all the water out.  Notes, that she got agitated yesterday because she was not feeling very well and feels very calm today.  She states she likes her doctors and trust them and they are helping her to get her better. She states she was living in motel and is trying to get her mother's house back,whose deed is in her purse.   Past Psychiatric History: Schizophrenia  Risk to Self:  No Risk to Others:  No Prior Inpatient Therapy:  Multiple inpatient hospitalization, multiple emergency room visits Prior Outpatient Therapy:  Followed at [facility]  Past Medical History:  Past Medical  History:  Diagnosis Date  . Chronic back pain   . Chronic kidney disease (CKD), stage II (mild)    Class I-II  . Coronary artery disease 04/2009   50% stenosis in the perforator of LAD; catheterization was for an abnormal Myoview in January 2000 showing anterior and inferolateral ischemia.  . Diverticulitis   . 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 > 3 subsequent Echos show improved/normal EF with moderate to severe concentric LVH and diastolic dysfunction with LVOT/intracavitary gradient --> 06/2016: Severe LVH.  Vigorous EF, 65-70%.?? Gr 1 DD. Mild AS.  [name] Hyperlipidemia   . Hypertension   . Hypertensive hypertrophic cardiomyopathy: NYHA class II:  Echo: Severe concentric LVH with LV OT gradient; essentially preserved EF with diastolic dysfunction 02/15/2013   Echo 06/2016: Severe Concentric LVH. Vigorous EF 65-70%. ~ Gr I DD.   . Mild aortic stenosis by prior echocardiography    Echo 06/2016: Mild AS (Mean Gradient 15 mmHg); has had prior Mod-Severe MR (not seen on current echo)  . PAD (peripheral artery disease) [facility]) March 2013   Lower extremity Dopplers: R. SFA 50-60%, R. PTA proximally occluded with distal reconstitution;; L. common iliac ~50%, L. SFA 50-70% stenosis, L. PTA < 50%  . Schizophrenia [facility])     Past Surgical History:  Procedure Laterality Date  . BUNIONECTOMY    . carotid doppler  05/29/2011   left bulb/prox ICA moderate amtfibrous plaque with no evidence significant reduction.,right bulb /proximal ICA normal patency  . lower extremity doppler  05/29/2011   right SFA 50%  to 59% diameter reduction,right posterior tibal atreery occlusive disease,reconstituting distally, left common illiac<50%,left SFA 50 to70%,left post. tibial <50%  . NM MYOCAR PERF WALL MOTION  03/2009   Persantine; EF 51%-both anterior and inferolateral ischemia  . TRANSTHORACIC ECHOCARDIOGRAM  06/2016    Severe LVH.  Vigorous EF of 65-70%.  No RWMA. ~Only grade 1 diastolic dysfunction.  Mild aortic stenosis (mean gradient 15 mmHg)  . TRANSTHORACIC ECHOCARDIOGRAM  07/2012   EF 50-55%; severe concentric LVH; only grade 1 diastolic dysfunction. Mild aortic sclerosis - with LVOT /intracavitary gradient of roughly 20 mmHg mean. Mild to moderately dilated LA;; previously reported MR not seen   Family History:  Family History  Problem Relation Age of Onset  . Hypertension Mother   . Breast cancer Neg Hx    Family Psychiatric  History: Sister has marijuana use disorder Social History:  Social History   Substance and Sexual Activity  Alcohol Use No     Social History   Substance and Sexual Activity  Drug Use No    Social History   Socioeconomic History  . Marital status: Single    Spouse name: Not on file  . Number of children: 7  . Years of education: Not on file  . Highest education level: Not on file  Occupational History  . Not on file  Tobacco Use  . Smoking status: Former Smoker    Types: Cigarettes    Quit date: 05/14/2002    Years since quitting: 18.0  . Smokeless tobacco: Never Used  Vaping Use  . Vaping Use: Never used  Substance and Sexual Activity  . Alcohol use: No  . Drug use: No  . Sexual activity: Yes    Birth control/protection: Post-menopausal  Other Topics Concern  . Not on file  Social History Narrative   Now single mother of 2 with one grandchild. She quit smoking roughly 5 years ago and is not so since. She has also stopped drinking alcohol. She does try get routine exercise walking at least a mile 3-4 days a week.    She lives with her 1 year old mother. 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   Social Determinants of Health   Financial Resource Strain: Not on file  Food Insecurity: Not on file  Transportation Needs: Not on file  Physical Activity: Not on file  Stress: Not on file  Social Connections: Not on file      Allergies:  No Known  Allergies  Labs:  CBC Latest Ref Rng & Units 05/18/2020 05/16/2020 05/15/2020  WBC 4.0 - 10.5 K/uL 6.8 6.6 6.7  Hemoglobin 12.0 - 15.0 g/dL 10.6(L) 10.3(L) 9.6(L)  Hematocrit 36.0 - 46.0 % 35.2(L) 34.0(L) 31.2(L)  Platelets 150 - 400 K/uL 352 313 283   BMP Latest Ref Rng & Units 05/18/2020 05/17/2020 05/16/2020  Glucose 70 - 99 mg/dL 93 629(B) 284(X)  BUN 8 - 23 mg/dL 32(G) 40(N) 22  Creatinine 0.44 - 1.00 mg/dL 0.27(O) 5.36(U) 4.40(H)  BUN/Creat Ratio 12 - 28 - - -  Sodium 135 - 145 mmol/L 136 136 139  Potassium 3.5 - 5.1 mmol/L 4.0 4.0 3.7  Chloride 98 - 111 mmol/L 98 98 102  CO2 22 - 32 mmol/L 32 34(H) 33(H)  Calcium 8.9 - 10.3 mg/dL 8.9 8.9 9.0    Current Facility-Administered Medications  Medication Dose Route Frequency Provider Last Rate Last Admin  . acetaminophen (TYLENOL) tablet 650 mg  650 mg Oral Q6H PRN Dellia Cloud, MD   650 mg at 05/18/20 [phone]  Or  . acetaminophen (TYLENOL) suppository 650 mg  650 mg Rectal Q6H PRN Dellia Cloud, MD      . aspirin EC tablet 81 mg  81 mg Oral Daily Glenford Bayley, MD   81 mg at 05/18/20 1100  . atorvastatin (LIPITOR) tablet 40 mg  40 mg Oral Daily Glenford Bayley, MD   40 mg at 05/18/20 1100  . carvedilol (COREG) tablet 25 mg  25 mg Oral BID WC Sande Rives, MD   25 mg at 05/18/20 5277  . enoxaparin (LOVENOX) injection 40 mg  40 mg Subcutaneous Q24H Dellia Cloud, MD   40 mg at 05/17/20 1730  . ferrous gluconate (FERGON) tablet 324 mg  324 mg Oral Q breakfast Glenford Bayley, MD   324 mg at 05/18/20 0839  . folic acid (FOLVITE) tablet 1 mg  1 mg Oral Daily Dellia Cloud, MD   1 mg at 05/18/20 1100  . hydrALAZINE (APRESOLINE) tablet 100 mg  100 mg Oral BID Glenford Bayley, MD      . isosorbide mononitrate (IMDUR) 24 hr tablet 60 mg  60 mg Oral Daily Sande Rives, MD   60 mg at 05/18/20 1100  . multivitamin with minerals tablet 1 tablet  1 tablet Oral Daily Dellia Cloud, MD   1 tablet at 05/18/20 1100  . OLANZapine  (ZYPREXA) tablet 10 mg  10 mg Oral QHS Glenford Bayley, MD   10 mg at 05/17/20 2133  . OLANZapine (ZYPREXA) tablet 5 mg  5 mg Oral Daily Glenford Bayley, MD   5 mg at 05/18/20 1100  . ondansetron (ZOFRAN) tablet 4 mg  4 mg Oral Q6H PRN Dellia Cloud, MD       Or  . ondansetron [facility]) injection 4 mg  4 mg Intravenous Q6H PRN Dellia Cloud, MD   4 mg at 05/16/20 2235  . senna (SENOKOT) tablet 8.6 mg  1 tablet Oral QHS PRN Dellia Cloud, MD      . sodium chloride flush (NS) 0.9 % injection 3 mL  3 mL Intravenous Q12H Dellia Cloud, MD   3 mL at 05/18/20 1100  . thiamine tablet 100 mg  100 mg Oral Daily Dellia Cloud, MD   100 mg at 05/18/20 1100    Musculoskeletal: Strength & Muscle Tone: within normal limits Gait & Station: normal Patient leans: N/A   Psychiatric Specialty Exam: Physical Exam Vitals and nursing note reviewed.  Constitutional:      Appearance: She is well-developed.  HENT:     Head: Normocephalic and atraumatic.  Pulmonary:     Effort: Pulmonary effort is normal.  Musculoskeletal:     Cervical back: Normal range of motion.  Neurological:     Mental Status: She is alert. She is disoriented.     Review of Systems  Blood pressure 107/65, pulse 64, temperature 97.8 F (36.6 C), resp. rate 18, height 5\' 7"  (1.702 m), weight 103.3 kg, last menstrual period 05/10/2013, SpO2 96 %.Body mass index is 35.67 kg/m.  General Appearance: Casual  Eye Contact:  Good  Speech:  Normal Rate  Volume:  Normal  Mood:  Euphoric  Affect:  Restricted  Thought Process:  Disorganized and Descriptions of Associations: Circumstantial  Orientation:  Other:  Knows her name, age, name of hospital  Thought Content:  Delusions and Hallucinations: Auditory Visual  Suicidal Thoughts:  No  Homicidal Thoughts:  No  Memory:  Immediate;   Fair Recent;   Poor Remote;   Poor  Judgement:  Impaired  Insight:  Fair  Psychomotor Activity:  Normal  Concentration:  Concentration: Fair and  Attention Span: Fair  Recall:  Fis[employer]Fair  Fund of Knowledge:  Fair  Language:  Fair  Akathisia:  No  Handed:  Right  AIMS (if indicated):     Assets:  Desire for Improvement Resilience Social Support  ADL's:  Impaired  Cognition:  Impaired,  Moderate  Sleep:      Assessment: 70 year old with past psychiatric disease order of schizophrenia admitted after congestive heart failure which is improving.  Psychiatry is consulted today due to her agitation yesterday during evaluation and to evaluate her capacity to make medical decisions. #Schizophrenia -On evaluation today patient denies suicidal or homicidal ideations.  She denies auditory visual hallucination but was clearly responding to internal stimuli.  She is delusional but pleasant today.  Able to recognize that she was agitated yesterday as she was not doing very well but feels better today. -For evaluation of medical decision-making capacity following questions were asked: --What is your understanding of your condition?  Swollen legs --Options for you situation?  Hospital --Understanding of benefits of treatment, and what are the odds that the treatment will work for you?  I will be able to breathe again, swelling will go down. --Risk of treatment, and whether or start you may have a side effect or bad outcome?  I do not know --Tell me what you really believe about her medical condition?  I am told that pills will make this swelling go away --Why do you think your doctor has recommended specific treatment for you?  To help me breathe better and make my swelling go away --Do think specific treatment is best for you?  Yes because pills always work --Do think will actually happen to you if you accept this treatment?  If you do not accepted?  I feel better because I is up-to-date --Tests/issues are most important during describing about your treatment?  What are you thinking about as you consider your decision?  I am better and I am happy --How  are you balancing the pluses and minuses of the treatment?  No I feel better --Do trust your doctor?  Why or why not?  Yes, because I feel better --What you think will happen to you now?  I can go and get to stay in my mother's house --Been given a lot of information about your condition.  We decided what medical option is best for you right now?  Pills  -Patient is able to state her name, age and just that she is in hospital but she does not know the city, state, name of the president and was unable to answer most of the questions of cognition.  To evaluate her capacity better we will require more testing to clarify about her dementia.  We will plan to increase her Zyprexa to help with the psychosis and then evaluate her dementia better.  Treatment Plan : --Follow-up RPR, vitamin B12, folate --Follow-up CT head --Recommend doing carotid Doppler --Increase Zyprexa to 10 mg twice daily for psychosis --Daily contact with patient to assess and evaluate symptoms and progress in treatment --Psychiatry will continue to follow  Disposition:  --No evidence of imminent risk to self or others at present. --Patient does not meet criteria for psychiatric inpatient admission  Arnoldo LenisAnjali  Christianjames Soule, MD 05/18/2020 12:41 PM  PGY-1, Resident

## 2020-05-18 NOTE — Progress Notes (Signed)
   Subjective:   Ms. Swigert states that she feels well today without any complaints other than increased swelling and tightness of her legs. However, the timeline for this is unclear as she said it improved with diuretics. Denies any CP, SOB, or palpitations. States she continues to be stressed about her housing situation, saying that her mother has a house she is unable to tend well to. States again that she presented to the hospital from a motel.   Objective:  Vital signs in last 24 hours: Vitals:   05/17/20 1353 05/17/20 1710 05/17/20 2115 05/18/20 0456  BP: 108/77 99/64 (!) 92/56 135/89  Pulse:  65 87 65  Resp:  19 18 18   Temp:  98.7 F (37.1 C) 98.6 F (37 C) 98.5 F (36.9 C)  TempSrc:    Oral  SpO2:  92% 91% 98%  Weight:    103.3 kg  Height:       General: Patient appears tired but comfortable, in no acute distress Respiratory: Breathing comfortably on room air.  Cardiovascular: RRR. There is a 3/6 SEM heard in the right upper chest. There is a 2/6 blowing holosystolic murmur along the left lower sternal border and 2/6 murmur present in the right lower chest. No lower extremity pitting edema. Unable to assess for JVD. Psych: Pleasant and cooperative with some paranoid ideation and tangential thought/speech. Skin: No signs of chronic venous stasis changes in extremities.   Assessment/Plan:  Active Problems:   Severe uncontrolled hypertension   Acute heart failure (HCC)   Elevated troponin   SOB (shortness of breath)  New Onset Systolic Heart Failure (EF 30-35%)  Lasix held given AKI on CKD. Urine output has decreased significantly off Lasix; however, she continues to appear euvolemic on exam without symptoms.   - Patient is not a good candidate for LHC per cardiology  - Continue coreg 25mg  BID  - Decreased hydralazine to 50mg  BID  - Continue Imdur 60mg  dialy  - Hold Losartan and adding spironolactone for now - Strict I&O  - Daily weights  Severe Symptomatic  Hypertension  Blood pressures soft 2/2 up-titration of GDMT for CHF. Improved after hydralazine frequency was decreased.   - Continue regimen as above; hold losartan   AKI on CKD III Creatinine increased from 1.33 to 1.86 with acute decrease in GFR to 29 since yesterday. Likely in setting of over-diuresis.   - Continue to hold Lasix  - Hold ACE/ARB, spironolactone  - Continue to monitor daily renal function   Paranoid Schizophrenia  Paranoia seems to be improving with increase in olanzapine last night; however, continues to have mild paranoid ideation with disorganized speech/thought. Psychiatry following, appreciate their assistance greatly. Patient does have capacity to make decisions today.   - Increase olanzapine to 10mg  twice daily   - Will need to corroborate patient's housing situation. I am concerned patient is living with homelessness.   Moderate AS, TR, and mild MR Acute Normocytic Anemia w/ IDA Stable.  - Continue PO iron supplementation daily  - Will require outpatient monitoring   Prior to Admission Living Arrangement: Motel, Unstable Housing Situation Anticipated Discharge Location: Possibly to group home, currently in process Barriers to Discharge: Continued Medical Therapy / Placement   , MD 05/18/2020, 7:38 AM Pager: [phone] After 5pm on weekdays and 1pm on weekends: On Call pager [phone]

## 2020-05-18 NOTE — Progress Notes (Addendum)
Progress Note  Patient Name: Erin Riley Date of Encounter: 05/18/2020  [facility] Cardiologist: Bryan Lemma, MD   Subjective   No chest pain or SOB overnight. Has not ambulated in the halls  Inpatient Medications    Scheduled Meds: . aspirin EC  81 mg Oral Daily  . atorvastatin  40 mg Oral Daily  . carvedilol  25 mg Oral BID WC  . enoxaparin (LOVENOX) injection  40 mg Subcutaneous Q24H  . ferrous gluconate  324 mg Oral Q breakfast  . folic acid  1 mg Oral Daily  . hydrALAZINE  100 mg Oral BID  . isosorbide mononitrate  60 mg Oral Daily  . multivitamin with minerals  1 tablet Oral Daily  . OLANZapine  10 mg Oral QHS  . OLANZapine  5 mg Oral Daily  . sodium chloride flush  3 mL Intravenous Q12H  . thiamine  100 mg Oral Daily   Continuous Infusions:  PRN Meds: acetaminophen **OR** acetaminophen, ondansetron **OR** ondansetron (ZOFRAN) IV, senna   Vital Signs    Vitals:   05/17/20 1710 05/17/20 2115 05/18/20 0456 05/18/20 0930  BP: 99/64 (!) 92/56 135/89 107/65  Pulse: 65 87 65 64  Resp: 19 18 18 18   Temp: 98.7 F (37.1 C) 98.6 F (37 C) 98.5 F (36.9 C) 97.8 F (36.6 C)  TempSrc:   Oral   SpO2: 92% 91% 98% 96%  Weight:   103.3 kg   Height:        Intake/Output Summary (Last 24 hours) at 05/18/2020 1215 Last data filed at 05/18/2020 0935 Gross per 24 hour  Intake 240 ml  Output 950 ml  Net -710 ml   Last 3 Weights 05/18/2020 05/17/2020 05/16/2020  Weight (lbs) 227 lb 11.8 oz 222 lb 3.6 oz 225 lb 5.1 oz  Weight (kg) 103.3 kg 100.8 kg 102.204 kg  Some encounter information is confidential and restricted. Go to Review Flowsheets activity to see all data.      Telemetry    SR, Sbrady 50s, occ PVCs, 4 bt run NSVT  - Personally Reviewed  ECG    No new tracings - Personally Reviewed  Physical Exam   General: Well developed, well nourished, female in no acute distress Head: Eyes PERRLA, Head normocephalic and atraumatic Lungs: few rales bases  bilaterally to auscultation. Heart: HRRR S1 S2, without rub or gallop.  Harsh 2/6 CFD SEM at RUSB--neck.. 4/4 extremity pulses are 2+ & equal. No JVD. Abdomen: Bowel sounds are present, abdomen soft and non-tender without masses or  hernias noted. Msk: Normal strength and tone for age. Extremities: No clubbing, cyanosis or edema.    Skin:  No rashes or lesions noted. Neuro: Alert and oriented X 3. Psych:  Good affect, responds appropriately  Labs    High Sensitivity Troponin:   Recent Labs  Lab 05/13/20 1210 05/13/20 1440  TROPONINIHS 35* 37*      Chemistry Recent Labs  Lab 05/14/20 0350 05/15/20 0327 05/16/20 0511 05/17/20 0649 05/18/20 0107  NA 141   < > 139 136 136  K 3.6   < > 3.7 4.0 4.0  CL 108   < > 102 98 98  CO2 26   < > 33* 34* 32  GLUCOSE 104*   < > 112* 107* 93  BUN 28*   < > 22 26* 33*  CREATININE 1.54*   < > 1.33* 1.78* 1.86*  CALCIUM 9.0   < > 9.0 8.9 8.9  PROT 6.3*  --   --   --   --  ALBUMIN 3.2*  --   --   --   --   AST 20  --   --   --   --   ALT 22  --   --   --   --   ALKPHOS 68  --   --   --   --   BILITOT 0.8  --   --   --   --   GFRNONAA 37*   < > 44* 31* 29*  ANIONGAP 7   < > 4* 4* 6   < > = values in this interval not displayed.     Hematology Recent Labs  Lab 05/15/20 0327 05/16/20 0511 05/16/20 0611 05/18/20 0107  WBC 6.7 6.6  --  6.8  RBC 3.23* 3.51* 3.47* 3.68*  HGB 9.6* 10.3*  --  10.6*  HCT 31.2* 34.0*  --  35.2*  MCV 96.6 96.9  --  95.7  MCH 29.7 29.3  --  28.8  MCHC 30.8 30.3  --  30.1  RDW 16.8* 16.6*  --  16.1*  PLT 283 313  --  352    BNP Recent Labs  Lab 05/13/20 1210  BNP 2,524.0*    Magnesium  Date Value Ref Range Status  05/18/2020 2.3 1.7 - 2.4 mg/dL Final    Comment:    Performed at [facility], [address] [place], [place] [address]  05/16/2020 1.9 1.7 - 2.4 mg/dL Final    Comment:    Performed at [facility], [address], [place], [place] [address]  05/15/2020 1.9 1.7 - 2.4  mg/dL Final    Comment:    Performed at [facility], [address], [place], [place] [address]   Lab Results  Component Value Date   TSH 1.059 05/13/2020   Lab Results  Component Value Date   CHOL 98 05/14/2020   HDL 27 (L) 05/14/2020   LDLCALC 56 05/14/2020   TRIG 75 05/14/2020   CHOLHDL 3.6 05/14/2020   Lab Results  Component Value Date   HGBA1C 5.2 05/13/2020   DDimer No results for input(s): DDIMER in the last 168 hours.   Radiology    No results found.  Cardiac Studies   TTE 05/14/2020: LVEF 30-35%.  Global HK.  Severe asymmetric LVH with posterior lateral hypertrophy.  GR 3 DD (restrictive).  Severe LA dilation, mild MR.-Moderate TR.moderate AS, mildly elevated RVP -> RAP estimated 15 mmHg, dilated IVC.[name]     Patient Profile     70 y.o. female with a PMH of moderate non-obstructive CAD, PAD, HTN, aortic stenosis, paranoid schizophrenia, and CKD stage 3, who is being followed by cardiology for acute combined CHF.   Assessment & Plan    1. Acute combined CHF:  - diuresed w/ IV Lasix - I/O net -6.9 L - Cr increased w diuresis and IV Lasix DC'd - Cr continues to increase, albeit minimal - EF 30-35%, presumed NICM 2nd HTN, no ischemic eval planned - wt trending up off Lasix, 229 lbs >> 225 >> 222 >> 227 lbs - losartan d/c'd after 03/14 dose - on Coreg along with hydralazine/nitrate for afterload reduction.  Would like to get back on ARB prior to discharge. - possibly start oral Lasix in am - ck O2 sats w/ ambulation  2. HTN: poorly controlled on admission in the setting of medication non-compliance, now improved. Likely contributing to #1.  - prescribed but not taking pta: amlodipine 10 mg qd, Coreg  25 mg bid, clonidine 0.1 mg tid, hydralazine 50 mg q 6 h, Imdur 60 mg qd, losartan/HCTZ 100-25 mg qd - now on home Coreg and Imdur, Hydralazine 100 mg bid -> blood pressure is little low today, but starting to stabilize out.  Would like to discharge if renal  function stabilizes. - SBP 90s-130s last 24 hr - continue current therapy  3. CAD: known 50% LAD at cath 2011 - on ASA, BB, statin - no clearly ischemic sx now that BP is controlled  4. Aortic stenosis: moderate on echo this admission -- f/u as outpatient  5. HLD:  - continue statin  6. AoCKD stage 3:  - Cr baseline 1.27-1.48 - 1.27 on admit, now 1.86 - Lasix d/c'd => we will need to convert to oral diuretic prior to discharge. - continue to follow - likely 2nd hypertension  Otherwise, per IM    Signed, Theodore Demark, PA-C  05/18/2020, 12:15 PM    For questions or updates, please contact [facility] Please consult [email or web address] for contact info under    ATTENDING ATTESTATION  I have seen, examined and evaluated the patient this PM along with Theodore Demark, PA.  After reviewing all the available data and chart, we discussed the patients laboratory, study & physical findings as well as symptoms in detail. I agree with her findings, examination as well as impression recommendations as per our discussion.    Attending adjustments noted in italics.   Unfortunately, Ms. Knisley has a tendency to be nonadherent to her medical management.  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   Initially presented with hypertensive urgency, now having borderline pressures.  This would argue that she is probably not taking all the medications that are listed at home.  For now we will consolidate using current dose of carvedilol along with hydralazine and Imdur (can having converted hydralazine to twice daily dosing) for afterload reduction.  Would preferably have her on losartan, this is currently on hold because of worsening renal insufficiency.  I suspect that we have  Probably reached the level of adequate diuresis with indicated by the morning creatinine.  I question daily weights get TAVR gaining weight as she does  not seem to have any edema or orthopnea.  As her renal function stabilizes, we do need to make sure that she tolerates oral diuretic prior to discharge.  I suspect she is getting close to discharge, but would like to have more stable regimen for she goes home.  The biggest concern is that will she continue to be adherent to the current medicine regimen, although she become nonadherent again.  Probably needs some supervision at home to ensure that she is actually adequately take care of herself.  Question if there is some home health assistance that could help ensure that she is getting her medications organized.   Will continue to follow.     Bryan Lemma, M.D., M.S. Interventional Cardiologist   Pager # [phone] Phone # [phone] [address] [place], [place] [address]

## 2020-05-18 NOTE — Progress Notes (Signed)
Heart Failure Stewardship Pharmacist Progress Note   PCP: Pcp, No PCP-Cardiologist: Bryan Lemma, MD    HPI:  70 yo F with PMH of CAD, HTN, aortic stenosis, paranoid schizophrenia, CKD III, HLD, and PVD. She presented to the ED on 05/13/20 BLE edema and shortness of breath. She was admitted for HTN urgency, and acute on chronic diastolic CHF exacerbation. An ECHO was done on 05/14/20 and LVEF was 30-35%. Notably noncompliant with medications PTA. She states she lives alone at a motel.  Current HF Medications: Carvedilol 25 mg BID Hydralazine 100 mg q8h Imdur 60 mg daily  Prior to admission HF Medications: None  Pertinent Lab Values: . Serum creatinine 1.33>1.78>1.86, BUN 33, Potassium 4.0, Sodium 136, BNP 2524.0, Magnesium 2.3  Vital Signs: . Weight: 227 lbs (admission weight: 223 lbs) . Blood pressure: 100-130/60-80s  . Heart rate: 60s   Medication Assistance / Insurance Benefits Check: Does the patient have prescription insurance?  Yes Type of insurance plan: Medicare + Medicaid  Outpatient Pharmacy:  Prior to admission outpatient pharmacy: [facility] Is the patient willing to use [facility] TOC pharmacy at discharge? Yes Is the patient willing to transition their outpatient pharmacy to utilize a [facility] outpatient pharmacy?   Pending    Assessment: 1. Acute on chronic systolic CHF (EF 23-36%), likely due to NICM. NYHA class II symptoms. - Continue carvedilol 25 mg BID - Off losartan 50 mg daily with bump in SCr yesterday - Consider starting spironolactone prior to discharge pending improvement in SCr  - Continue hydralazine 100 mg TID - Continue Imdur 60 mg daily   Plan: 1) Medication changes recommended at this time: - Continue current regimen; SCr still up today - no room to restart losartan or add spironolactone   2) Patient assistance: - High risk for continued medication non-compliance at home - Disposition: group home vs nursing facility   Sharen Hones, PharmD,  BCPS Heart Failure Stewardship Pharmacist Phone [phone]

## 2020-05-19 ENCOUNTER — Inpatient Hospital Stay (HOSPITAL_COMMUNITY): Payer: Medicare Other

## 2020-05-19 ENCOUNTER — Encounter (HOSPITAL_COMMUNITY): Payer: Medicare Other

## 2020-05-19 DIAGNOSIS — F200 Paranoid schizophrenia: Secondary | ICD-10-CM

## 2020-05-19 DIAGNOSIS — Z9114 Patient's other noncompliance with medication regimen: Secondary | ICD-10-CM

## 2020-05-19 LAB — RENAL FUNCTION PANEL
Albumin: 2.9 g/dL — ABNORMAL LOW (ref 3.5–5.0)
Anion gap: 7 (ref 5–15)
BUN: 25 mg/dL — ABNORMAL HIGH (ref 8–23)
CO2: 28 mmol/L (ref 22–32)
Calcium: 9.1 mg/dL (ref 8.9–10.3)
Chloride: 104 mmol/L (ref 98–111)
Creatinine, Ser: 1.41 mg/dL — ABNORMAL HIGH (ref 0.44–1.00)
GFR, Estimated: 41 mL/min — ABNORMAL LOW (ref >60)
Glucose, Bld: 85 mg/dL (ref 70–99)
Phosphorus: 3 mg/dL (ref 2.5–4.6)
Potassium: 4.1 mmol/L (ref 3.5–5.1)
Sodium: 139 mmol/L (ref 135–145)

## 2020-05-19 LAB — GLUCOSE, CAPILLARY: Glucose-Capillary: 142 mg/dL — ABNORMAL HIGH (ref 70–99)

## 2020-05-19 LAB — RPR: RPR Ser Ql: NONREACTIVE

## 2020-05-19 MED ORDER — OLANZAPINE 5 MG PO TBDP
20.0000 mg | ORAL_TABLET | Freq: Every day | ORAL | Status: DC
  Administered 2020-05-19: 20 mg via ORAL
  Filled 2020-05-19 (×2): qty 4

## 2020-05-19 MED ORDER — OLANZAPINE 5 MG PO TABS: 10.0000 mg | ORAL_TABLET | Freq: Every day | ORAL | Status: DC

## 2020-05-19 MED ORDER — FUROSEMIDE 20 MG PO TABS
20.0000 mg | ORAL_TABLET | Freq: Every day | ORAL | Status: DC
  Administered 2020-05-19 – 2020-05-20 (×2): 20 mg via ORAL
  Filled 2020-05-19 (×3): qty 1

## 2020-05-19 MED ORDER — LOSARTAN POTASSIUM 25 MG PO TABS
25.0000 mg | ORAL_TABLET | Freq: Every day | ORAL | Status: DC
  Administered 2020-05-19 – 2020-05-20 (×2): 25 mg via ORAL
  Filled 2020-05-19 (×2): qty 1

## 2020-05-19 MED ORDER — ONDANSETRON HCL 4 MG PO TABS
4.0000 mg | ORAL_TABLET | Freq: Once | ORAL | Status: DC
  Filled 2020-05-19: qty 1

## 2020-05-19 MED ORDER — OLANZAPINE 10 MG IM SOLR
10.0000 mg | Freq: Two times a day (BID) | INTRAMUSCULAR | Status: DC | PRN
  Filled 2020-05-19: qty 10

## 2020-05-19 MED ORDER — OLANZAPINE 5 MG PO TBDP
10.0000 mg | ORAL_TABLET | Freq: Every day | ORAL | Status: DC
  Administered 2020-05-20: 10 mg via ORAL
  Filled 2020-05-19: qty 2

## 2020-05-19 NOTE — Progress Notes (Signed)
Physical Therapy Treatment Patient Details Name: Erin Riley MRN: [id number] DOB: August 30, 1950 Today's Date: 05/19/2020    History of Present Illness Erin Riley is a 70 y.o. female admitted for  evaluation of CHF.  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  PT Comments    Pt received in bed, reporting that she was tired and complaining about the food from cafeteria. Eventually convinced pt to try mobility. Independent for bed mobility. Pt declined use of AD, any sort of physical assistance, and gait belt. PT with close guarding for supervision due to pt's request. Upon standing pt unsteady, flexed posture and staggering in no clear direction. Pt became more unsteady appeared to lose balance as if she were about to fall. PT reached out to prevent fall and pt became violent, attempting to hit PT. Pt made it safely back to bed at supervision level, agitated and stating "I already know how to do all this stuff". Pt impulsive with decreased safety awareness, very verbally and physically abusive. If this behavior continues, will have to sign off for safety reasons. Left in bed with call bell within reach, bed alarm active, and RN aware of status. Will continue to monitor acutely.   Follow Up Recommendations  Home health PT;Supervision - Intermittent     Equipment Recommendations  Other (comment) (Rollator RW)    Recommendations for Other Services       Precautions / Restrictions Precautions Precautions: Fall Precaution Comments: impulsive, does not like to be touched, can become very verbally/physically abusive Restrictions Weight Bearing Restrictions: No    Mobility  Bed Mobility Overal bed mobility: Independent                  Transfers Overall transfer level: Needs assistance   Transfers: Sit to/from  Stand Sit to Stand: Supervision         General transfer comment: No physical assist required, but close guard for safety due to impulsivity, declined use of RW  Ambulation/Gait Ambulation/Gait assistance: Min guard Gait Distance (Feet): 10 Feet   Gait Pattern/deviations: Step-through pattern;Drifts right/left;Staggering left;Staggering right;Decreased stride length     General Gait Details: Walked around bed, but very unsmooth movement, staggering in different directions, looked like she had 1 LOB where she hit wall but when PT went to assist pt became aggressive   Stairs             Wheelchair Mobility    Modified Rankin (Stroke Patients Only)       Balance Overall balance assessment: Needs assistance Sitting-balance support: No upper extremity supported;Feet supported Sitting balance-Leahy Scale: Normal     Standing balance support: No upper extremity supported;During functional activity Standing balance-Leahy Scale: Fair Standing balance comment: Can stand and has ambulated with AD, but staggering. Believe pt would benefit from UE support                            Cognition Arousal/Alertness: Awake/alert (Initially sleepy, but woke up more with interaction) Behavior During Therapy: [facility] for tasks assessed/performed;Impulsive Overall Cognitive Status: History of cognitive impairments - at baseline Area of Impairment: Safety/judgement;Following commands                       Following Commands: Follows one step commands inconsistently Safety/Judgement: Decreased awareness of safety  General Comments: Hx of paranoid pchizophrenia, easily agitated, reports that "no one knows what i've gone through", unwilling to accept help, very impulsive      Exercises      General Comments        Pertinent Vitals/Pain Pain Assessment: Faces Faces Pain Scale: Hurts a little bit Pain Location: generalized discomfort Pain Descriptors /  Indicators: Aching;Discomfort Pain Intervention(s): Monitored during session    Home Living                      Prior Function            PT Goals (current goals can now be found in the care plan section) Acute Rehab PT Goals Patient Stated Goal: to go home    Frequency    Min 3X/week      PT Plan Current plan remains appropriate    Co-evaluation              AM-PAC PT "6 Clicks" Mobility   Outcome Measure  Help needed turning from your back to your side while in a flat bed without using bedrails?: None Help needed moving from lying on your back to sitting on the side of a flat bed without using bedrails?: None Help needed moving to and from a bed to a chair (including a wheelchair)?: A Little Help needed standing up from a chair using your arms (e.g., wheelchair or bedside chair)?: A Little Help needed to walk in hospital room?: A Little Help needed climbing 3-5 steps with a railing? : A Little 6 Click Score: 20    End of Session Equipment Utilized During Treatment: Other (comment) (moved too impulsively to get a gait belt on) Activity Tolerance: Treatment limited secondary to agitation Patient left: in bed;with call bell/phone within reach;with bed alarm set Nurse Communication: Mobility status PT Visit Diagnosis: Unsteadiness on feet (R26.81);Muscle weakness (generalized) (M62.81)     Time:  -     Charges:             Conley Rolls, SPT

## 2020-05-19 NOTE — Plan of Care (Signed)
  Problem: Clinical Measurements: Goal: Ability to maintain clinical measurements within normal limits will improve Outcome: Progressing   Problem: Activity: Goal: Risk for activity intolerance will decrease Outcome: Progressing   

## 2020-05-19 NOTE — Progress Notes (Signed)
Subjective:   Erin Riley states her stomach is upset this morning. She feels nauseas but has not vomited. She refused to eat her breakfast as she believed someone put Clorox in it. Denies any abdominal pain, diarrhea, or constipation. Denies any CP or SOB and states her leg swelling improved significantly s/p diuresis. She does have a period of unintelligible tangential speech during conversation and when asked to repeat, she says "you know what I said".  Objective:  Vital signs in last 24 hours: Vitals:   05/18/20 0930 05/18/20 2101 05/19/20 0317 05/19/20 0900  BP: 107/65 (!) 142/87  (!) 142/78  Pulse: 64 63  66  Resp: 18 18  16   Temp: 97.8 F (36.6 C) 98 F (36.7 C)  98.2 F (36.8 C)  TempSrc:  Oral  Oral  SpO2: 96% 95%  96%  Weight:  103.3 kg 103.3 kg   Height:       General: Patient appears tired but comfortable, in no acute distress Respiratory: Breathing comfortably on room air.  Cardiovascular: RRR. There is a 2/6 SEM heard in the right upper chest. No other audible m/r/g. No lower extremity pitting edema. There is minimal JVD.  Psych: Patient had a period of unintelligible tangential speech with delusion regarding her food. Otherwise pleasant and cooperative.   Skin: No signs of chronic venous stasis changes in extremities.   Assessment/Plan:  Principal Problem:   Acute heart failure (HCC) Active Problems:   Severe uncontrolled hypertension   Elevated troponin   SOB (shortness of breath)   Evaluation by psychiatric service required  New Onset Systolic Heart Failure (EF 30-35%)  Lasix held given AKI on CKD. Urine output has decreased significantly off Lasix. She does have minimal JVD and her weight is up, although she has no crackles or LE edema.   - Patient is not a good candidate for LHC per cardiology  - Will restart Lasix at low dose, 20mg  PO once, monitor response  - Continue coreg 25mg  BID  - Continue hydralazine 50mg  BID - Continue Imdur 60mg  dialy  -  Restart Losartan 25mg  daily - Consider addition of spironolactone prior to discharge if pressures and kidney function allow - Strict I&O  - Daily weights  Severe Symptomatic Hypertension  Blood pressures were soft yesterday with GDMT for CHF, although blood pressures improved after Hydralazine was decreased to BID yesterday and losartan/lasix were held. She has refused her hydralazine this morning.   - Continue regimen with changes as noted above - Continue monitoring   AKI on CKD III Creatinine improved significantly since yesterday to 1.41, GFR 41, baseline ~1.3 and 46, respectively.    - Restart Losartan and Lasix  - Continue to monitor daily renal function   Paranoid Schizophrenia  Psychiatry following, appreciate their recommendations greatly. There is concern for dementia given impaired cognitive functioning, although there is no significant atrophy on CT head with mild chronic microvascular disease but no unexpected calcifications. Patient refusing carotid ultrasound currently. She continues to be intermittently agitated, requesting to leave the hospital this morning. Does have paranoid delusion of poisoning in her breakfast this morning, refusing to eat and refusing new medications such as Zofran and Hydralazine.   - Increase evening Zyprexa dose to 20mg  nightly  - Continue Zyprexa 10mg  in the morning  - Addition of Zyprexa 10mg  PO or IM for aggression / agitation  - Greatly appreciate TOC/SW assistance with placement   Moderate AS, TR, and mild MR Acute Normocytic Anemia w/ IDA Stable. Denies  abdominal pain.  - Continue PO iron supplementation daily  - Will require outpatient colonoscopy / further monitoring   Prior to Admission Living Arrangement: Motel, Unstable Housing Situation Anticipated Discharge Location: Group Home vs. Nursing Facility Barriers to Discharge: Psychiatric stabilization / Placement   Glenford Bayley, MD 05/19/2020, 1:37 PM Pager:  [phone] After 5pm on weekdays and 1pm on weekends: On Call pager [phone]

## 2020-05-19 NOTE — TOC Progression Note (Addendum)
Transition of Care [facility]) - Progression Note    Patient Details  Name: Erin Riley MRN: [id number] Date of Birth: 1950-07-22  [facility] CM/SW Contact  Okey Dupre Lazaro Arms, LCSW Phone Number: 05/19/2020, 4:28 PM  Clinical Narrative:  Reviewed MD note and contact made with MD regarding discharge disposition - group home. Explained that CSW talked with patient on 3/15 and her plan is to return to the [facility]. CSW contacted the motel on 3/15 while in the room with [name] and confirmed that she has a room there. CSW expressed understanding regarding patient's psychiatric issues, however as she has not been determined incompetent (by a judge) she can made make her own decisions and at this time she wants to return to the motel.     Call made to [facility] and asked for Judeth Cornfield (was advised to ask for this person by [facility] with Vesta Mixer). Was informed that she had left for the day. CSW will attempt to reach her on 3/17.  CSW will continue to follow and provide SW interventions services as needed through discharge.   Expected Discharge Plan: Home w Home Health Services (If patient accepts [facility] services) Barriers to Discharge: Continued Medical Work up  Expected Discharge Plan and Services Expected Discharge Plan: Home w Home Health Services (If patient accepts [facility] services) In-house Referral: Clinical Social Work   Post Acute Care Choice: Home Health [facility] services will be discussed with patient) Living arrangements for the past 2 months: Single Family Home (When asked, patient reported that she lives in her mother's home. Unsure of her living situation.)                                     Social Determinants of Health (SDOH) Interventions  Patient has mental health issuers and is being followed by psychiatry  Readmission Risk Interventions No flowsheet data found.

## 2020-05-19 NOTE — Progress Notes (Signed)
Heart Failure Stewardship Pharmacist Progress Note   PCP: Pcp, No PCP-Cardiologist: Bryan Lemma, MD    HPI:  70 yo F with PMH of CAD, HTN, aortic stenosis, paranoid schizophrenia, CKD III, HLD, and PVD. She presented to the ED on 05/13/20 BLE edema and shortness of breath. She was admitted for HTN urgency, and acute on chronic diastolic CHF exacerbation. An ECHO was done on 05/14/20 and LVEF was 30-35%. Notably noncompliant with medications PTA. She states she lives alone at a motel.  Current HF Medications: Carvedilol 25 mg BID Losartan 25 mg daily Hydralazine 100 mg BID Imdur 60 mg daily  Prior to admission HF Medications: None  Pertinent Lab Values: . Serum creatinine 1.86>1.41, BUN 25, Potassium 4.1, Sodium 139, BNP 2524.0, Magnesium 2.3  Vital Signs: . Weight: 227 lbs (admission weight: 223 lbs) . Blood pressure: 140/70s  . Heart rate: 60s   Medication Assistance / Insurance Benefits Check: Does the patient have prescription insurance?  Yes Type of insurance plan: Medicare + Medicaid  Outpatient Pharmacy:  Prior to admission outpatient pharmacy: [facility] Is the patient willing to use [facility] TOC pharmacy at discharge? Yes Is the patient willing to transition their outpatient pharmacy to utilize a [facility] outpatient pharmacy?   Pending    Assessment: 1. Acute on chronic systolic CHF (EF 71-69%), likely due to NICM. NYHA class II symptoms. - Continue carvedilol 25 mg BID - Agree with restarting losartan 25 mg daily today - Consider starting spironolactone prior to discharge pending improvement in SCr  - Continue hydralazine 100 mg BID - Continue Imdur 60 mg daily   Plan: 1) Medication changes recommended at this time: - Agree with changes as above  2) Patient assistance: - High risk for continued medication non-compliance at home - Disposition: group home vs nursing facility   Sharen Hones, PharmD, BCPS Heart Failure Stewardship Pharmacist Phone [phone]

## 2020-05-19 NOTE — Progress Notes (Signed)
Psychiatry Progress Note  05/19/2020 12:04 PM Erin Riley  MRN:  [id number]   Subjective: This morning patient refused telemetry monitoring and try to leave the hospital .  Patient is evaluated at bedside this morning, no family present in the room.  She states " not sure but I feel very sleepy today".  Denies any suicidal or homicidal ideations.  Denies any auditory or visual hallucinations.  Principal Problem: Acute heart failure (HCC) Diagnosis: Principal Problem:   Acute heart failure (HCC) Active Problems:   Severe uncontrolled hypertension   Elevated troponin   SOB (shortness of breath)   Evaluation by psychiatric service required  Total Time spent with patient: 15 minutes  Past Psychiatric History: Schizophrenia  Past Medical History:  Past Medical History:  Diagnosis Date  . Chronic back pain   . Chronic kidney disease (CKD), stage II (mild)    Class I-II  . Coronary artery disease 04/2009   50% stenosis in the perforator of LAD; catheterization was for an abnormal Myoview in January 2000 showing anterior and inferolateral ischemia.  . Diverticulitis   . 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 > 3 subsequent Echos show improved/normal EF with moderate to severe concentric LVH and diastolic dysfunction with LVOT/intracavitary gradient --> 06/2016: Severe LVH.  Vigorous EF, 65-70%.?? Gr 1 DD. Mild AS.  [name]. Hyperlipidemia   . Hypertension   . Hypertensive hypertrophic cardiomyopathy: NYHA class II:  Echo: Severe concentric LVH with LV OT gradient; essentially preserved EF with diastolic dysfunction 02/15/2013   Echo 06/2016: Severe Concentric LVH. Vigorous EF 65-70%. ~ Gr I DD.   . Mild aortic stenosis by prior echocardiography    Echo 06/2016: Mild AS (Mean Gradient 15 mmHg); has had prior Mod-Severe MR (not seen on current echo)  . PAD (peripheral artery disease) [facility](HCC) March 2013    Lower extremity Dopplers: R. SFA 50-60%, R. PTA proximally occluded with distal reconstitution;; L. common iliac ~50%, L. SFA 50-70% stenosis, L. PTA < 50%  . Schizophrenia [facility](HCC)     Past Surgical History:  Procedure Laterality Date  . BUNIONECTOMY    . carotid doppler  05/29/2011   left bulb/prox ICA moderate amtfibrous plaque with no evidence significant reduction.,right bulb /proximal ICA normal patency  . lower extremity doppler  05/29/2011   right SFA 50% to 59% diameter reduction,right posterior tibal atreery occlusive disease,reconstituting distally, left common illiac<50%,left SFA 50 to70%,left post. tibial <50%  . NM MYOCAR PERF WALL MOTION  03/2009   Persantine; EF 51%-both anterior and inferolateral ischemia  . TRANSTHORACIC ECHOCARDIOGRAM  06/2016   Severe LVH.  Vigorous EF of 65-70%.  No RWMA. ~Only grade 1 diastolic dysfunction.  Mild aortic stenosis (mean gradient 15 mmHg)  . TRANSTHORACIC ECHOCARDIOGRAM  07/2012   EF 50-55%; severe concentric LVH; only grade 1 diastolic dysfunction. Mild aortic sclerosis - with LVOT /intracavitary gradient of roughly 20 mmHg mean. Mild to moderately dilated LA;; previously reported MR not seen   Family History:  Family History  Problem Relation Age of Onset  . Hypertension Mother   . Breast cancer Neg Hx    Family Psychiatric  History: Sister has marijuana use disorder Social History:  Social History   Substance and Sexual Activity  Alcohol Use No     Social History   Substance and Sexual Activity  Drug Use No    Social History   Socioeconomic History  . Marital  status: Single    Spouse name: Not on file  . Number of children: 7  . Years of education: Not on file  . Highest education level: Not on file  Occupational History  . Not on file  Tobacco Use  . Smoking status: Former Smoker    Types: Cigarettes    Quit date: 05/14/2002    Years since quitting: 18.0  . Smokeless tobacco: Never Used  Vaping Use  . Vaping Use:  Never used  Substance and Sexual Activity  . Alcohol use: No  . Drug use: No  . Sexual activity: Yes    Birth control/protection: Post-menopausal  Other Topics Concern  . Not on file  Social History Narrative   Now single mother of 2 with one grandchild. She quit smoking roughly 5 years ago and is not so since. She has also stopped drinking alcohol. She does try get routine exercise walking at least a mile 3-4 days a week.    She lives with her 58 year old mother. 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   Social Determinants of Health   Financial Resource Strain: Not on file  Food Insecurity: Not on file  Transportation Needs: Not on file  Physical Activity: Not on file  Stress: Not on file  Social Connections: Not on file    Sleep: Good  Appetite:  Good  Current Medications: Current Facility-Administered Medications  Medication Dose Route Frequency Provider Last Rate Last Admin  . acetaminophen (TYLENOL) tablet 650 mg  650 mg Oral Q6H PRN Dellia Cloud, MD   650 mg at 05/18/20 0033   Or  . acetaminophen (TYLENOL) suppository 650 mg  650 mg Rectal Q6H PRN Dellia Cloud, MD      . aspirin EC tablet 81 mg  81 mg Oral Daily Glenford Bayley, MD   81 mg at 05/19/20 5885  . atorvastatin (LIPITOR) tablet 40 mg  40 mg Oral Daily Glenford Bayley, MD   40 mg at 05/19/20 0277  . carvedilol (COREG) tablet 25 mg  25 mg Oral BID WC Sande Rives, MD   25 mg at 05/19/20 [phone]  . enoxaparin (LOVENOX) injection 40 mg  40 mg Subcutaneous Q24H Dellia Cloud, MD   40 mg at 05/18/20 1630  . ferrous gluconate (FERGON) tablet 324 mg  324 mg Oral Q breakfast Glenford Bayley, MD   324 mg at 05/19/20 7867  . folic acid (FOLVITE) tablet 1 mg  1 mg Oral Daily Dellia Cloud, MD   1 mg at 05/19/20 6720  . furosemide (LASIX) tablet 20 mg  20 mg Oral Daily Glenford Bayley, MD      . hydrALAZINE (APRESOLINE) tablet 100 mg  100 mg Oral BID Glenford Bayley, MD   100 mg at 05/18/20 2140  . isosorbide  mononitrate (IMDUR) 24 hr tablet 60 mg  60 mg Oral Daily Sande Rives, MD   60 mg at 05/19/20 9470  . losartan (COZAAR) tablet 25 mg  25 mg Oral Daily Glenford Bayley, MD      . multivitamin with minerals tablet 1 tablet  1 tablet Oral Daily Dellia Cloud, MD   1 tablet at 05/19/20 [phone]  . OLANZapine (ZYPREXA) tablet 10 mg  10 mg Oral BID Dagar, Geralynn Rile, MD   10 mg at 05/19/20 3662  . ondansetron (ZOFRAN) tablet 4 mg  4 mg Oral Q6H PRN Dellia Cloud, MD       Or  . ondansetron [facility]) injection 4 mg  4 mg Intravenous Q6H PRN Dellia Cloud,  MD   4 mg at 05/16/20 2235  . ondansetron (ZOFRAN) tablet 4 mg  4 mg Oral Once Glenford Bayley, MD      . senna [facility]) tablet 8.6 mg  1 tablet Oral QHS PRN Dellia Cloud, MD      . sodium chloride flush (NS) 0.9 % injection 3 mL  3 mL Intravenous Q12H Dellia Cloud, MD   3 mL at 05/18/20 2140  . thiamine tablet 100 mg  100 mg Oral Daily Dellia Cloud, MD   100 mg at 05/19/20 4098    Lab Results:  CBC Latest Ref Rng & Units 05/18/2020 05/16/2020 05/15/2020  WBC 4.0 - 10.5 K/uL 6.8 6.6 6.7  Hemoglobin 12.0 - 15.0 g/dL 10.6(L) 10.3(L) 9.6(L)  Hematocrit 36.0 - 46.0 % 35.2(L) 34.0(L) 31.2(L)  Platelets 150 - 400 K/uL 352 313 283   BMP Latest Ref Rng & Units 05/19/2020 05/18/2020 05/17/2020  Glucose 70 - 99 mg/dL 85 93 119(J)  BUN 8 - 23 mg/dL 47(W) 29(F) 62(Z)  Creatinine 0.44 - 1.00 mg/dL 3.08(M) 5.78(I) 6.96(E)  BUN/Creat Ratio 12 - 28 - - -  Sodium 135 - 145 mmol/L 139 136 136  Potassium 3.5 - 5.1 mmol/L 4.1 4.0 4.0  Chloride 98 - 111 mmol/L 104 98 98  CO2 22 - 32 mmol/L 28 32 34(H)  Calcium 8.9 - 10.3 mg/dL 9.1 8.9 8.9    Blood Alcohol level:  Lab Results  Component Value Date   ETH <10 12/15/2019   ETH <10 08/07/2019    Metabolic Disorder Labs: Lab Results  Component Value Date   HGBA1C 5.2 05/13/2020   MPG 102.54 05/13/2020   MPG 123 12/18/2019   Lab Results  Component Value Date   PROLACTIN 27.3 (H) 12/18/2019   PROLACTIN  16.5 06/09/2016   Lab Results  Component Value Date   CHOL 98 05/14/2020   TRIG 75 05/14/2020   HDL 27 (L) 05/14/2020   CHOLHDL 3.6 05/14/2020   VLDL 15 05/14/2020   LDLCALC 56 05/14/2020   LDLCALC 99 12/18/2019   Musculoskeletal: Strength & Muscle Tone: within normal limits Gait & Station: normal Patient leans: N/A   Psychiatric Specialty Exam: Physical Exam Vitals and nursing note reviewed.  HENT:     Head: Normocephalic and atraumatic.  Cardiovascular:     Rate and Rhythm: Normal rate and regular rhythm.     Pulses: Normal pulses.  Pulmonary:     Effort: Pulmonary effort is normal.  Musculoskeletal:        General: Normal range of motion.     Cervical back: Normal range of motion.     Review of Systems  Blood pressure (!) 142/78, pulse 66, temperature 98.2 F (36.8 C), temperature source Oral, resp. rate 16, height 5\' 7"  (1.702 m), weight 103.3 kg, last menstrual period 05/10/2013, SpO2 96 %.Body mass index is 35.67 kg/m.  General Appearance: Casual  Eye Contact:  Minimal  Speech:  Normal Rate  Volume:  Normal  Mood:  Dysphoric  Affect:  Appropriate  Thought Process:  Goal Directed and Descriptions of Associations: Circumstantial  Orientation:  Full (Time, Place, and Person)  Thought Content:  Delusions and Hallucinations: Auditory Visual  Suicidal Thoughts:  No  Homicidal Thoughts:  No  Memory:  Immediate;   Fair Recent;   Poor Remote;   Poor  Judgement:  Impaired  Insight:  Fair  Psychomotor Activity:  Normal  Concentration:  Concentration: Fair and Attention Span: Fair  Recall:  07/10/2013 of Knowledge:  Fair  Language:  Fair  Akathisia:  No  Handed:  Right  AIMS (if indicated):     Assets:  Desire for Improvement Resilience  ADL's:  Impaired  Cognition:  Impaired,  Moderate  Sleep:      Assessment: 70 year old with past psychiatric history of schizophrenia admitted after congestive heart failure which is  improving. #Schizophrenia -Evaluation today patient denies suicidal or homicidal ideations.  She denies any auditory or visual hallucinations she was difficult to arouse today.  This morning she was extremely agitated and was trying to leave the hospital, took out her cardiac monitoring and was uncooperative with her exam.  She is still delusional and denies that she was agitated this morning. -RPR is nonreactive, folate 46.4, vitamin B12 348, CT head does not show any acute abnormality and also no change in mild appearing chronic microvascular ischemic disease. -Patient would benefit from increasing her Zyprexa at nighttime and I would also put an order for Zyprexa as needed for agitation or aggression.   Treatment Plan : --Increase Zyprexa to 20 mg nightly for psychosis and continue 10 mg in the morning --Start Zyprexa 10 mg p.o. or IM for agitation/aggression --Daily contact with patient to assess and evaluate symptoms and progress in treatment --Psychiatry will continue to follow  Arnoldo Lenis, MD 05/19/2020, 12:04 PM  PGY-1, Resident

## 2020-05-19 NOTE — Progress Notes (Signed)
Paged On [facility] internal medicine, about patient refusing to keep cardiac monitor on. MD, also notified about patient trying to leave the hospital. Will continue to monitor.   Donia Guiles, RN.

## 2020-05-19 NOTE — CV Procedure (Signed)
Patient refusing carotid duplex at this time. "Why won't y'all just leave me alone"  Results can be found under chart review under CV PROC. 05/19/2020 1:17 PM Erikka Follmer RVT, RDMS

## 2020-05-20 ENCOUNTER — Other Ambulatory Visit: Payer: Self-pay | Admitting: Student

## 2020-05-20 LAB — BASIC METABOLIC PANEL
Anion gap: 6 (ref 5–15)
BUN: 23 mg/dL (ref 8–23)
CO2: 29 mmol/L (ref 22–32)
Calcium: 9.2 mg/dL (ref 8.9–10.3)
Chloride: 103 mmol/L (ref 98–111)
Creatinine, Ser: 1.48 mg/dL — ABNORMAL HIGH (ref 0.44–1.00)
GFR, Estimated: 38 mL/min — ABNORMAL LOW (ref >60)
Glucose, Bld: 124 mg/dL — ABNORMAL HIGH (ref 70–99)
Potassium: 4.3 mmol/L (ref 3.5–5.1)
Sodium: 138 mmol/L (ref 135–145)

## 2020-05-20 LAB — CBC
HCT: 32.7 % — ABNORMAL LOW (ref 36.0–46.0)
Hemoglobin: 10.3 g/dL — ABNORMAL LOW (ref 12.0–15.0)
MCH: 29.9 pg (ref 26.0–34.0)
MCHC: 31.5 g/dL (ref 30.0–36.0)
MCV: 94.8 fL (ref 80.0–100.0)
Platelets: 320 10*3/uL (ref 150–400)
RBC: 3.45 MIL/uL — ABNORMAL LOW (ref 3.87–5.11)
RDW: 16 % — ABNORMAL HIGH (ref 11.5–15.5)
WBC: 5.9 10*3/uL (ref 4.0–10.5)
nRBC: 0 % (ref 0.0–0.2)

## 2020-05-20 MED ORDER — ASPIRIN 81 MG PO TBEC: 81.0000 mg | DELAYED_RELEASE_TABLET | Freq: Every day | ORAL | 0 refills | Status: DC

## 2020-05-20 MED ORDER — AMLODIPINE BESYLATE 10 MG PO TABS: 10.0000 mg | ORAL_TABLET | Freq: Every day | ORAL | 0 refills | Status: DC

## 2020-05-20 MED ORDER — FERROUS GLUCONATE 324 (38 FE) MG PO TABS: 324.0000 mg | ORAL_TABLET | Freq: Every day | ORAL | 0 refills | Status: DC

## 2020-05-20 MED ORDER — ISOSORBIDE MONONITRATE ER 60 MG PO TB24: 60.0000 mg | ORAL_TABLET | Freq: Every day | ORAL | 0 refills | Status: DC

## 2020-05-20 MED ORDER — FUROSEMIDE 20 MG PO TABS: 20.0000 mg | ORAL_TABLET | Freq: Every day | ORAL | 0 refills | Status: DC

## 2020-05-20 MED ORDER — LOSARTAN POTASSIUM 25 MG PO TABS: 25.0000 mg | ORAL_TABLET | Freq: Every day | ORAL | 0 refills | Status: DC

## 2020-05-20 MED ORDER — SPIRONOLACTONE 25 MG PO TABS: 12.5000 mg | ORAL_TABLET | Freq: Every day | ORAL | 0 refills | Status: DC

## 2020-05-20 MED ORDER — ATORVASTATIN CALCIUM 40 MG PO TABS
40.0000 mg | ORAL_TABLET | Freq: Every day | ORAL | 0 refills | Status: DC
Start: 2020-05-20 — End: 2021-01-26

## 2020-05-20 MED ORDER — SPIRONOLACTONE 12.5 MG HALF TABLET
12.5000 mg | ORAL_TABLET | Freq: Every day | ORAL | Status: DC
  Administered 2020-05-20: 12.5 mg via ORAL
  Filled 2020-05-20: qty 1

## 2020-05-20 MED ORDER — OLANZAPINE 10 MG PO TBDP: 10.0000 mg | ORAL_TABLET | Freq: Every morning | ORAL | 0 refills | Status: DC

## 2020-05-20 MED ORDER — HYDRALAZINE HCL 100 MG PO TABS: 100.0000 mg | ORAL_TABLET | Freq: Two times a day (BID) | ORAL | 0 refills | Status: DC

## 2020-05-20 MED ORDER — CARVEDILOL 25 MG PO TABS: 25.0000 mg | ORAL_TABLET | Freq: Two times a day (BID) | ORAL | 0 refills | Status: DC

## 2020-05-20 MED ORDER — OLANZAPINE 20 MG PO TBDP: 20.0000 mg | ORAL_TABLET | Freq: Every day | ORAL | 0 refills | Status: DC

## 2020-05-20 NOTE — Progress Notes (Signed)
Wyvonnia Lora to be discharged Home per MD order. Discussed prescriptions and follow up appointments with the patient.  Medications from [facility] was delivered to the patient, went over all the medications patient verbalized understanding and was able to go over couple of medications.  Asked when she has f/u appt with [facility], reinforced the date and time with her and she nodded her head.  Skin clean, dry and intact without evidence of skin break down, no evidence of skin tears noted. IV catheter discontinued intact. Site without signs and symptoms of complications. Dressing and pressure applied. Pt denies pain at the site currently. No complaints noted.  Patient free of lines, drains, and wounds.   An After Visit Summary (AVS) was printed and given to the patient. Patient escorted via wheelchair, and discharged home via private auto.  Arvilla Meres, RN

## 2020-05-20 NOTE — Progress Notes (Signed)
Psychiatry Progress Note  05/20/2020 2:32 PM Erin Riley  MRN:  [id number]   Subjective: Patient evaluated at bedside this morning.  Patient states" I am here to get my medical treatment and I would not like psychiatry to be involved in my care". "  Do not come and meet me again I will not be talking to any psychiatric person".  Principal Problem: Acute heart failure (HCC) Diagnosis: Principal Problem:   Acute heart failure (HCC) Active Problems:   Severe uncontrolled hypertension   Elevated troponin   SOB (shortness of breath)   Evaluation by psychiatric service required  Total Time spent with patient: 10 minutes  Past Psychiatric History: Schizophrenia  Past Medical History:  Past Medical History:  Diagnosis Date  . Chronic back pain   . Chronic kidney disease (CKD), stage II (mild)    Class I-II  . Coronary artery disease 04/2009   50% stenosis in the perforator of LAD; catheterization was for an abnormal Myoview in January 2000 showing anterior and inferolateral ischemia.  . Diverticulitis   . 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 > 3 subsequent Echos show improved/normal EF with moderate to severe concentric LVH and diastolic dysfunction with LVOT/intracavitary gradient --> 06/2016: Severe LVH.  Vigorous EF, 65-70%.?? Gr 1 DD. Mild AS.  [name] Hyperlipidemia   . Hypertension   . Hypertensive hypertrophic cardiomyopathy: NYHA class II:  Echo: Severe concentric LVH with LV OT gradient; essentially preserved EF with diastolic dysfunction 02/15/2013   Echo 06/2016: Severe Concentric LVH. Vigorous EF 65-70%. ~ Gr I DD.   . Mild aortic stenosis by prior echocardiography    Echo 06/2016: Mild AS (Mean Gradient 15 mmHg); has had prior Mod-Severe MR (not seen on current echo)  . PAD (peripheral artery disease) [facility]) March 2013   Lower extremity Dopplers: R. SFA 50-60%, R. PTA proximally occluded  with distal reconstitution;; L. common iliac ~50%, L. SFA 50-70% stenosis, L. PTA < 50%  . Schizophrenia [facility])     Past Surgical History:  Procedure Laterality Date  . BUNIONECTOMY    . carotid doppler  05/29/2011   left bulb/prox ICA moderate amtfibrous plaque with no evidence significant reduction.,right bulb /proximal ICA normal patency  . lower extremity doppler  05/29/2011   right SFA 50% to 59% diameter reduction,right posterior tibal atreery occlusive disease,reconstituting distally, left common illiac<50%,left SFA 50 to70%,left post. tibial <50%  . NM MYOCAR PERF WALL MOTION  03/2009   Persantine; EF 51%-both anterior and inferolateral ischemia  . TRANSTHORACIC ECHOCARDIOGRAM  06/2016   Severe LVH.  Vigorous EF of 65-70%.  No RWMA. ~Only grade 1 diastolic dysfunction.  Mild aortic stenosis (mean gradient 15 mmHg)  . TRANSTHORACIC ECHOCARDIOGRAM  07/2012   EF 50-55%; severe concentric LVH; only grade 1 diastolic dysfunction. Mild aortic sclerosis - with LVOT /intracavitary gradient of roughly 20 mmHg mean. Mild to moderately dilated LA;; previously reported MR not seen   Family History:  Family History  Problem Relation Age of Onset  . Hypertension Mother   . Breast cancer Neg Hx    Family Psychiatric  History: Sister has marijuana use disorder Social History:  Social History   Substance and Sexual Activity  Alcohol Use No     Social History   Substance and Sexual Activity  Drug Use No    Social History   Socioeconomic History  . Marital status: Single    Spouse  name: Not on file  . Number of children: 7  . Years of education: Not on file  . Highest education level: Not on file  Occupational History  . Not on file  Tobacco Use  . Smoking status: Former Smoker    Types: Cigarettes    Quit date: 05/14/2002    Years since quitting: 18.0  . Smokeless tobacco: Never Used  Vaping Use  . Vaping Use: Never used  Substance and Sexual Activity  . Alcohol use: No  .  Drug use: No  . Sexual activity: Yes    Birth control/protection: Post-menopausal  Other Topics Concern  . Not on file  Social History Narrative   Now single mother of 2 with one grandchild. She quit smoking roughly 5 years ago and is not so since. She has also stopped drinking alcohol. She does try get routine exercise walking at least a mile 3-4 days a week.    She lives with her 3 year old mother. 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   Social Determinants of Health   Financial Resource Strain: Not on file  Food Insecurity: Not on file  Transportation Needs: Not on file  Physical Activity: Not on file  Stress: Not on file  Social Connections: Not on file    Sleep: Good  Appetite:  Good  Current Medications: Current Facility-Administered Medications  Medication Dose Route Frequency Provider Last Rate Last Admin  . acetaminophen (TYLENOL) tablet 650 mg  650 mg Oral Q6H PRN Dellia Cloud, MD   650 mg at 05/18/20 0033   Or  . acetaminophen (TYLENOL) suppository 650 mg  650 mg Rectal Q6H PRN Dellia Cloud, MD      . aspirin EC tablet 81 mg  81 mg Oral Daily Glenford Bayley, MD   81 mg at 05/20/20 1013  . atorvastatin (LIPITOR) tablet 40 mg  40 mg Oral Daily Glenford Bayley, MD   40 mg at 05/20/20 1013  . carvedilol (COREG) tablet 25 mg  25 mg Oral BID WC Sande Rives, MD   25 mg at 05/20/20 0741  . enoxaparin (LOVENOX) injection 40 mg  40 mg Subcutaneous Q24H Dellia Cloud, MD   40 mg at 05/18/20 1630  . ferrous gluconate (FERGON) tablet 324 mg  324 mg Oral Q breakfast Glenford Bayley, MD   324 mg at 05/20/20 0741  . folic acid (FOLVITE) tablet 1 mg  1 mg Oral Daily Dellia Cloud, MD   1 mg at 05/20/20 1013  . furosemide (LASIX) tablet 20 mg  20 mg Oral Daily Glenford Bayley, MD   20 mg at 05/20/20 1013  . hydrALAZINE (APRESOLINE) tablet 100 mg  100 mg Oral BID Glenford Bayley, MD   100 mg at 05/20/20 1013  . isosorbide mononitrate (IMDUR) 24 hr tablet 60 mg  60 mg Oral  Daily Sande Rives, MD   60 mg at 05/20/20 1013  . losartan (COZAAR) tablet 25 mg  25 mg Oral Daily Glenford Bayley, MD   25 mg at 05/20/20 1013  . multivitamin with minerals tablet 1 tablet  1 tablet Oral Daily Dellia Cloud, MD   1 tablet at 05/20/20 1014  . OLANZapine (ZYPREXA) injection 10 mg  10 mg Intramuscular Q12H PRN Dagar, Geralynn Rile, MD      . OLANZapine zydis (ZYPREXA) disintegrating tablet 10 mg  10 mg Oral Daily Dagar, Anjali, MD   10 mg at 05/20/20 1014  . OLANZapine zydis (ZYPREXA) disintegrating tablet 20 mg  20 mg Oral QHS Dagar, Geralynn Rile,  MD   20 mg at 05/19/20 2137  . ondansetron (ZOFRAN) tablet 4 mg  4 mg Oral Q6H PRN Dellia Cloudoe, Benjamin, MD       Or  . ondansetron [facility](ZOFRAN) injection 4 mg  4 mg Intravenous Q6H PRN Dellia Cloudoe, Benjamin, MD   4 mg at 05/16/20 2235  . ondansetron (ZOFRAN) tablet 4 mg  4 mg Oral Once Glenford BayleySpeakman, Rachel, MD      . senna [facility](SENOKOT) tablet 8.6 mg  1 tablet Oral QHS PRN Dellia Cloudoe, Benjamin, MD      . sodium chloride flush (NS) 0.9 % injection 3 mL  3 mL Intravenous Q12H Dellia Cloudoe, Benjamin, MD   3 mL at 05/20/20 1014  . spironolactone (ALDACTONE) tablet 12.5 mg  12.5 mg Oral Daily Glenford BayleySpeakman, Rachel, MD      . thiamine tablet 100 mg  100 mg Oral Daily Dellia Cloudoe, Benjamin, MD   100 mg at 05/20/20 1013    Lab Results:  CBC Latest Ref Rng & Units 05/20/2020 05/18/2020 05/16/2020  WBC 4.0 - 10.5 K/uL 5.9 6.8 6.6  Hemoglobin 12.0 - 15.0 g/dL 10.3(L) 10.6(L) 10.3(L)  Hematocrit 36.0 - 46.0 % 32.7(L) 35.2(L) 34.0(L)  Platelets 150 - 400 K/uL 320 352 313   BMP Latest Ref Rng & Units 05/20/2020 05/19/2020 05/18/2020  Glucose 70 - 99 mg/dL 829(F124(H) 85 93  BUN 8 - 23 mg/dL 23 62(Z25(H) 30(Q33(H)  Creatinine 0.44 - 1.00 mg/dL 6.57(Q1.48(H) 4.69(G1.41(H) 2.95(M1.86(H)  BUN/Creat Ratio 12 - 28 - - -  Sodium 135 - 145 mmol/L 138 139 136  Potassium 3.5 - 5.1 mmol/L 4.3 4.1 4.0  Chloride 98 - 111 mmol/L 103 104 98  CO2 22 - 32 mmol/L 29 28 32  Calcium 8.9 - 10.3 mg/dL 9.2 9.1 8.9    Blood Alcohol level:  Lab Results   Component Value Date   ETH <10 12/15/2019   ETH <10 08/07/2019    Metabolic Disorder Labs: Lab Results  Component Value Date   HGBA1C 5.2 05/13/2020   MPG 102.54 05/13/2020   MPG 123 12/18/2019   Lab Results  Component Value Date   PROLACTIN 27.3 (H) 12/18/2019   PROLACTIN 16.5 06/09/2016   Lab Results  Component Value Date   CHOL 98 05/14/2020   TRIG 75 05/14/2020   HDL 27 (L) 05/14/2020   CHOLHDL 3.6 05/14/2020   VLDL 15 05/14/2020   LDLCALC 56 05/14/2020   LDLCALC 99 12/18/2019   Musculoskeletal: Strength & Muscle Tone: within normal limits Gait & Station: normal Patient leans: N/A   Psychiatric Specialty Exam: Physical Exam Vitals and nursing note reviewed.  HENT:     Head: Normocephalic and atraumatic.  Cardiovascular:     Rate and Rhythm: Normal rate and regular rhythm.     Pulses: Normal pulses.  Pulmonary:     Effort: Pulmonary effort is normal.  Musculoskeletal:        General: Normal range of motion.     Cervical back: Normal range of motion.     Review of Systems  Blood pressure (!) 154/68, pulse (!) 58, temperature 98.4 F (36.9 C), resp. rate 17, height 5\' 7"  (1.702 m), weight 103.3 kg, last menstrual period 05/10/2013, SpO2 97 %.Body mass index is 35.67 kg/m.  General Appearance: Casual  Eye Contact:  Minimal  Speech:  Normal Rate  Volume:  Normal  Mood:  Dysphoric  Affect:  Appropriate  Thought Process:  Goal Directed  Orientation:  Full (Time, Place, and Person)  Thought Content:  Delusions  Suicidal Thoughts:  No  Homicidal Thoughts:  No  Memory:  Immediate;   Fair Recent;   Poor Remote;   Poor  Judgement:  Impaired  Insight:  Fair  Psychomotor Activity:  Normal  Concentration:  Concentration: Fair and Attention Span: Fair  Recall:  [employer] of Knowledge:  Fair  Language:  Fair  Akathisia:  No  Handed:  Right  AIMS (if indicated):     Assets:  Desire for Improvement Resilience  ADL's:  Impaired  Cognition:  Impaired,   Moderate  Sleep:      Assessment: 70 year old with past psychiatric history of schizophrenia admitted after congestive heart failure which is improving. #Schizophrenia -On evaluation today patient denies to talk to psychiatry team.  She is clear that she is in hospital for medical reason not for psychiatric support. -On my previous evaluation on 3/15 patient had capacity to make medical decisions.  She is med compliant now and would benefit from continuing antipsychotic.  Patient has chronic delusions and history of med noncompliance.  She is well-known to behavioral health Hospital and urgent care.  But this does not impact her medical decision-making capacity.   -RPR nonreactive, vitamin B12 3 8, folate 46.4. -Psychiatry team has followed patient and evaluated her twice for evaluation of capacity to make medical decisions and communicated that patient is capable to make her own medical decisions.  Medication adjustment has been done and patient should benefit from continuing these medications.  Treatment Plan : --Continue 1 mg daily and 20 mg nightly for psychosis --Zyprexa 10 mg p.o. or IM for agitation/aggression --Plan has been communicated to the primary team --Psychiatry is signing off but will be available for any further questions   Arnoldo Lenis, MD 05/20/2020, 2:32 PM  PGY-1, Resident

## 2020-05-20 NOTE — Progress Notes (Signed)
Heart Failure Stewardship Pharmacist Progress Note   PCP: Pcp, No PCP-Cardiologist: Bryan Lemma, MD    HPI:  70 yo F with PMH of CAD, HTN, aortic stenosis, paranoid schizophrenia, CKD III, HLD, and PVD. She presented to the ED on 05/13/20 BLE edema and shortness of breath. She was admitted for HTN urgency, and acute on chronic diastolic CHF exacerbation. An ECHO was done on 05/14/20 and LVEF was 30-35%. Notably noncompliant with medications PTA. She states she lives alone at a motel.  Current HF Medications: Furosemide 20 mg daily Carvedilol 25 mg BID Losartan 25 mg daily Hydralazine 100 mg BID Imdur 60 mg daily  Prior to admission HF Medications: None  Pertinent Lab Values: . Serum creatinine 1.48, BUN 23, Potassium 4.3, Sodium 138, BNP 2524.0, Magnesium 2.3  Vital Signs: . Weight: 227 lbs (admission weight: 223 lbs) . Blood pressure: 140/60s  . Heart rate: 60s   Medication Assistance / Insurance Benefits Check: Does the patient have prescription insurance?  Yes Type of insurance plan: Medicare + Medicaid  Outpatient Pharmacy:  Prior to admission outpatient pharmacy: [facility] Is the patient willing to use [facility] TOC pharmacy at discharge? Yes Is the patient willing to transition their outpatient pharmacy to utilize a [facility] outpatient pharmacy?   Pending    Assessment: 1. Acute on chronic systolic CHF (EF 35-46%), likely due to NICM. NYHA class II symptoms. - Continue furosemide 20 mg daily - Continue carvedilol 25 mg BID - Continue losartan 25 mg daily  - Consider starting spironolactone prior to discharge if SCr remains stable - Continue hydralazine 100 mg BID - Continue Imdur 60 mg daily   Plan: 1) Medication changes recommended at this time: - Start spironolactone 12.5 mg daily  2) Patient assistance: - High risk for continued medication non-compliance at home - Disposition: group home vs nursing facility   Sharen Hones, PharmD, BCPS Heart Failure  Stewardship Pharmacist Phone [phone]

## 2020-05-20 NOTE — TOC Progression Note (Addendum)
Transition of Care [facility]) - Progression Note    Patient Details  Name: Erin Riley MRN: [id number] Date of Birth: 1950/06/17  [facility]) CM/SW Contact  Okey Dupre Lazaro Arms, LCSW Phone Number: 05/20/2020, 2:28 PM  Clinical Narrative:  [facility] contacted [phone]) regarding patient and was informed that they are currently at a stand still with new applications, and there will probably be a wait list. CSW was asked for patient's name, address and phone number. CSW went to patient's room to confirm motel's room number and her phone number. [name] provided her room number - 130 and when asked for her phone number, became agitated and would not provide CSW with her phone number. The person from [employer] [facility] informed that patient would not provide her contact information. Patient will discharge today back to the [facility]    CSW talked with patient's son Elana Alm (3:35 pm) [phone] regarding his mother's discharge and he will be able to pick her up and transport her to the motel after he gets off work today. Patient's nurse informed.     Expected Discharge Plan: Home w Home Health Services (If patient accepts [facility] services) Barriers to Discharge: Continued Medical Work up  Expected Discharge Plan and Services Expected Discharge Plan: Home w Home Health Services (If patient accepts [facility] services) In-house Referral: Clinical Social Work   Post Acute Care Choice: Home Health [facility] services will be discussed with patient) Living arrangements for the past 2 months: Single Family Home (When asked, patient reported that she lives in her mother's home. Unsure of her living situation.)                                     Social Determinants of Health (SDOH) Interventions  Patient has mental health issues and an attempt was made to get patient on [facility] wait list, however she would not provide her phone number.   Readmission  Risk Interventions No flowsheet data found.

## 2020-05-20 NOTE — Progress Notes (Signed)
Brief cardiology follow-up  Patient was sleeping soundly and did not want to wake her up.  She appears to be doing stable from a clinical standpoint.  Appears to be having urine output on oral Lasix, and blood pressures are stable.   It seems that she is in the process of psychiatric evaluation and stabilizing of care.  We will recheck back tomorrow, but if stable will likely sign off and provide full recommendations.   No formal recommendations today.   Bryan Lemma, MD

## 2020-05-20 NOTE — Progress Notes (Incomplete)
Subjective:   Ms. Dusenbury reports that she did not eat breakfast, states that she didn't want it. She states that she will eat lunch.  She states that she needs to do papers to get her mothers house back. States that she wants her stuff back. States her mother is living, states that she is 70 years old. States she turned the house over to her but that her house is being taken away. She says that the [place] is also messing with her, states she lived there for 7 years and 3 months. States that we need to speak with [employer]. ***. States that [facility] was messing with her medications.  When asked why she was here she was not able to answer.   Objective:  Vital signs in last 24 hours: Vitals:   05/19/20 0317 05/19/20 0900 05/19/20 2118 05/20/20 0508  BP:  (!) 142/78 (!) 163/90 (!) 141/61  Pulse:  66 66 63  Resp:  16 18 18   Temp:  98.2 F (36.8 C) 97.8 F (36.6 C) 98.3 F (36.8 C)  TempSrc:  Oral    SpO2:  96% 97% 95%  Weight: 103.3 kg     Height:       General: Patient appears tired but comfortable, in no acute distress Respiratory: Breathing comfortably on room air.  Cardiovascular: RRR. There is a 2/6 SEM heard in the right upper chest. No other audible m/r/g. No lower extremity pitting edema. There is minimal JVD.  Psych: Patient had a period of unintelligible tangential speech with delusion regarding her food. Otherwise pleasant and cooperative.   Skin: No signs of chronic venous stasis changes in extremities.   Assessment/Plan:  Principal Problem:   Acute heart failure (HCC) Active Problems:   Severe uncontrolled hypertension   Elevated troponin   SOB (shortness of breath)   Evaluation by psychiatric service required  New Onset Systolic Heart Failure (EF 30-35%)  Lasix held given AKI on CKD. Urine output has decreased significantly off Lasix. She does have minimal JVD and her weight is up, although she has no crackles or LE edema.   - Patient is not a good candidate  for LHC per cardiology  - Will restart Lasix at low dose, 20mg  PO once, monitor response  - Continue coreg 25mg  BID  - Continue hydralazine 50mg  BID - Continue Imdur 60mg  dialy  - Restart Losartan 25mg  daily - Consider addition of spironolactone prior to discharge if pressures and kidney function allow - Strict I&O  - Daily weights  Severe Symptomatic Hypertension  Blood pressures were soft yesterday with GDMT for CHF, although blood pressures improved after Hydralazine was decreased to BID yesterday and losartan/lasix were held. She has refused her hydralazine this morning.   - Continue regimen with changes as noted above - Continue monitoring   AKI on CKD III Creatinine improved significantly since yesterday to 1.41, GFR 41, baseline ~1.3 and 46, respectively.    - Restart Losartan and Lasix  - Continue to monitor daily renal function   Paranoid Schizophrenia  Psychiatry following, appreciate their recommendations greatly. There is concern for dementia given impaired cognitive functioning, although there is no significant atrophy on CT head with mild chronic microvascular disease but no unexpected calcifications. Patient refusing carotid ultrasound currently. She continues to be intermittently agitated, requesting to leave the hospital this morning. Does have paranoid delusion of poisoning in her breakfast this morning, refusing to eat and refusing new medications such as Zofran and Hydralazine.   - Increase  evening Zyprexa dose to 20mg  nightly  - Continue Zyprexa 10mg  in the morning  - Addition of Zyprexa 10mg  PO or IM for aggression / agitation  - Greatly appreciate TOC/SW assistance with placement   Moderate AS, TR, and mild MR Acute Normocytic Anemia w/ IDA Stable. Denies abdominal pain.  - Continue PO iron supplementation daily  - Will require outpatient colonoscopy / further monitoring   Prior to Admission Living Arrangement: Motel, Unstable Housing Situation Anticipated  Discharge Location: Group Home vs. Nursing Facility Barriers to Discharge: Psychiatric stabilization / Placement   , MD 05/20/2020, 7:22 AM Pager: [phone] After 5pm on weekdays and 1pm on weekends: On Call pager [phone]

## 2020-05-20 NOTE — Care Management Important Message (Signed)
Important Message  Patient Details  Name: Erin Riley MRN: [id number] Date of Birth: 06-14-1950   Medicare Important Message Given:  Yes     Obed Samek P Brailee Riede 05/20/2020, 11:12 AM

## 2020-05-20 NOTE — Discharge Summary (Signed)
Name: Erin Riley MRN: [id number] DOB: January 13, 1951 70 y.o. PCP: Pcp, No  Date of Admission: 05/13/2020 12:01 PM Date of Discharge: 05/20/2020 Attending Physician: Earl Lagos, MD  Discharge Diagnosis: 1. Acute HFrEF (EF 30-35%)  2. Severe Symptomatic Hypertension  3. AKI on CKD III 4. Paranoid Schizophrenia  5. Moderate AS and TR 6. Mild MR 7. Acute Normocytic Anemia with IDA   Discharge Medications: Allergies as of 05/20/2020   No Known Allergies     Medication List    STOP taking these medications   cloNIDine 0.1 MG tablet Commonly known as: CATAPRES   losartan-hydrochlorothiazide 100-25 MG tablet Commonly known as: HYZAAR     TAKE these medications   amLODipine 10 MG tablet Commonly known as: NORVASC Take 1 tablet (10 mg total) by mouth daily.   aspirin 81 MG EC tablet Take 1 tablet (81 mg total) by mouth daily. Swallow whole. Start taking on: May 21, 2020   atorvastatin 40 MG tablet Commonly known as: LIPITOR Take 1 tablet (40 mg total) by mouth daily.   carvedilol 25 MG tablet Commonly known as: COREG Take 1 tablet (25 mg total) by mouth 2 (two) times daily with a meal.   ferrous gluconate 324 MG tablet Commonly known as: FERGON Take 1 tablet (324 mg total) by mouth daily with breakfast. Start taking on: May 21, 2020   furosemide 20 MG tablet Commonly known as: LASIX Take 1 tablet (20 mg total) by mouth daily. Start taking on: May 21, 2020   hydrALAZINE 100 MG tablet Commonly known as: APRESOLINE Take 1 tablet (100 mg total) by mouth 2 (two) times daily. What changed:   medication strength  how much to take  when to take this   isosorbide mononitrate 60 MG 24 hr tablet Commonly known as: IMDUR Take 1 tablet (60 mg total) by mouth daily.   losartan 25 MG tablet Commonly known as: COZAAR Take 1 tablet (25 mg total) by mouth daily. Start taking on: May 21, 2020   OLANZapine zydis 10 MG disintegrating tablet Commonly known  as: ZYPREXA Take 1 tablet (10 mg total) by mouth in the morning.   OLANZapine zydis 20 MG disintegrating tablet Commonly known as: ZYPREXA Take 1 tablet (20 mg total) by mouth at bedtime.   spironolactone 25 MG tablet Commonly known as: ALDACTONE Take 0.5 tablets (12.5 mg total) by mouth daily.       Disposition and follow-up:   Ms.Erin Riley was discharged from [facility] in Stable condition.  At the hospital follow up visit please address:  1.  Patient has active schizophrenia with paranoid delusions and history of medication non-compliance. Will require psychiatry referral and cardiology follow-up appointment.  1. Acute HFrEF (EF 30-35%) - Assess fluid status on Lasix 20mg  daily  2. Severe Symptomatic Hypertension - Assess pressures on above medication regimen; Spironolactone was added at discharge at 12.5mg  daily - consider up-titrating to 25mg  daily if pressures and renal function allow  3. AKI on CKD III - Repeat labs 4. Paranoid Schizophrenia - Assess response to Zyprexa 20mg  nightly and 10mg  daily, and consider possibility of injectable  5. Moderate AS and TR, Mild MR - Consider repeat ECHO to assess LV function following stabilization on GDMT 6. Acute Normocytic Anemia with IDA - Patient will need colonoscopy for evaluation of new anemia as well as repeat iron studies in a couple months, started on PO iron supplementation for mild IDA here.  2.  Labs / imaging  needed at time of follow-up: RFP, CBC, eventual iron studies and consideration of follow-up ECHO  3.  Pending labs/ test needing follow-up: None  Follow-up Appointments:  Follow-up Information    Burnice Logan, [place]. Go on 05/24/2020.   Specialty: Physician Assistant Why: Please attend your appointment with Dr. Alphonzo Lemmings at [facility] 05/24/20 or call to reschedule within the next 1 week. Contact information: [address] [place] [address] [phone]        Marykay Lex, MD. Schedule an appointment as soon as possible for a visit.   Specialty: Cardiology Why: Please call to schedule an appointment with your cardiologist within the next 1-2 weeks for evaluation. Contact information: [address] [place] [address] [phone]               Hospital Course by problem list:  New Onset Systolic Heart Failure (EF 30-35%)  AKI on CKD III Patient initially presented with mild hypoxia, with bilateral lower extremity pitting edema, edema on CXR, and JVD. TTE showed new rEF with LVEF 30-35% with global hypokinesis, normal RV size and function, severe asymmetric LVH of the posterior-lateral segment, and grade III restrictive diastolic dysfunction with severe LA dilation, most likely in the setting of NICM 2/2 chronic hypertension. IVC was dilated with decreased respiratory variation consistent with volume overload. Cardiology were consulted and HF pharmacist followed. She was deemed a poor LHC candidate. She was started on IV Lasix 40mg  daily with great response. She did develop AKI in setting of over-diuresis, although kidney function improved when lasix was held and she was started on Lasix 20mg  daily on discharge. Baseline Cr ~ 1.3, GFR 46 and was ~ 1.4 on discharge. She follows with cardiology outpatient and will require close follow up. Other GDMT as noted below.   Severe Symptomatic Hypertension  On arrival, SBP elevated >200 mmHg in the setting of medication non-compliance. She was started on IV labetalol and hydralazine in the ED with improvement in pressures, and GDMT was titrated for CHF to the following: Coreg 25mg  BID, Hydralazine 100mg  BID, Imdur 60mg  daily, Losartan 25mg  daily, and spironolactone 12.5mg  daily was added for discharge. She would benefit from continued monitoring and possible uptitration of spironolactone if renal function and pressures allow.   Paranoid Schizophrenia  Patient has history of medication  non-compliance, likely 2/2 active schizophrenia. She did suffer from paranoid delusions with tangential thoughts throughout her hospital stay, pre-occupied with obtaining her mother's house and concerns regarding virtual harrassment. She intermittently refused hospital medications and food although complied with physician discussions. Psychiatry were consulted and she was titrated to olanzapine 20mg  nightly and 10mg  in the mornings. She will require PCP referral to psychiatrist upon discharge. Confirmed she does not currently follow with a psychiatrist, at least through [facility], where she receives her primary care. Deemed to have capacity and was discharged to motel where she has been living and paying rent.   Moderate AS, TR, and mild MR Patient found to have above on TEE. Will likely benefit from repeat ECHO after stabilized on GDMT to assess response / monitor.  Acute Normocytic Anemia w/ IDA Hemoglobin was 10.3 on discharge. Stable but lower than her baseline. Found to have mild IDA here. Started on PO iron supplementation daily. Will require outpatient colonoscopy for further evaluation and repeat iron studies, although no active bleeding occurred in the hospital.   Discharge Subjective:  Ms. Felix states that she feels well. She denies any leg swelling. She  notes that she didn't have her medications prior to discharge as there were issues in trusting [facility]. She remains eager to go home and denies CP, SOB, or any other symptoms. She will not inform me who her PCP is.  Discharge Exam:   BP (!) 154/68 (BP Location: Right Arm)   Pulse (!) 58   Temp 98.4 F (36.9 C)   Resp 17   Ht 5\' 7"  (1.702 m)   Wt 103.3 kg   LMP 05/10/2013   SpO2 97%   BMI 35.67 kg/m  Discharge exam:  General: Patient appears tired but comfortable, in no acute distress Respiratory: Breathing comfortably on room air.  Cardiovascular: RRR. There is a 3/6 SEM heard in the right upper chest. There are  2/6 blowing systolic murmurs heard throughout entire chest. No lower extremity pitting edema.  Psych: Patient overall pleasant and cooperative. Does continue to have paranoid delusions and tangential thoughts.  Abdominal: Soft, without tenderness to palpation. Neurological: Somnolent. Follows simple commands.  Skin: No signs of chronic venous stasis changes in extremities.   Pertinent Labs, Studies, and Procedures:   Discharge Labs: Cr - 1.48, GFR 38 Hgb 10.3 B12 348, Folate 46, TSH 1.06 Iron 48, Ferritin 86, Saturation 14%, TIBC 340  CHEST - 2 VIEW COMPARISON:  08/08/2019 FINDINGS: Cardiac enlargement. Mild vascular congestion without edema or effusion. Negative for pneumonia Right shoulder replacement. Disc degeneration and spurring throughout the thoracic spine. IMPRESSION: Cardiac enlargement with mild vascular congestion. Negative for edema or effusion. Electronically Signed   By: Marlan Palauharles  Clark M.D.   On: 05/13/2020 12:53  PORTABLE CHEST 1 VIEW COMPARISON:  05/13/2020 radiograph, 04/18/2018 CT FINDINGS: Diffuse hazy and interstitial opacities throughout both lungs with cuffing, fissural and septal thickening and features of pulmonary vascular congestion. Cardiomegaly is again seen, slightly accentuated by the portable technique. The aorta is calcified. The remaining cardiomediastinal contours are unremarkable. Prior right shoulder arthroplasty. Severe degenerative changes in the left shoulder. Slightly high-riding appearance of both humeral heads likely reflecting underlying rotator cuff insufficiency. No acute osseous or soft tissue abnormality. Telemetry leads and external support devices overlie the chest. IMPRESSION: Features of CHF/volume overload including cardiomegaly and pulmonary vascular congestion. Hazy interstitial opacities suggest edema, particularly in the absence of infectious symptoms. Electronically Signed   By: Kreg ShropshirePrice  DeHay M.D.   On: 05/14/2020  02:31  CT HEAD WITHOUT CONTRAST COMPARISON:  Head CT 09/11/2018. FINDINGS: Brain: No evidence of acute infarction, hemorrhage, hydrocephalus, extra-axial collection or mass lesion/mass effect. Mild appearing chronic microvascular ischemic change is stable in appearance. The sella is unchanged. Vascular: No hyperdense vessel or unexpected calcification. Skull: Intact.  No focal lesion. Sinuses/Orbits: Negative. Other: None. IMPRESSION: No acute abnormality. No change in mild appearing chronic microvascular ischemic disease. Electronically Signed   By: Drusilla Kannerhomas  Dalessio M.D.   On: 05/18/2020 15:13  TEE 05/14/20: 1. Left ventricular ejection fraction, by estimation, is 30 to 35%. The  left ventricle has moderately decreased function. The left ventricle  demonstrates global hypokinesis. There is severe asymmetric left  ventricular hypertrophy of the  posterior-lateral segment. Left ventricular diastolic parameters are  consistent with Grade III diastolic dysfunction (restrictive).  2. Right ventricular systolic function is normal. The right ventricular  size is normal. There is moderately elevated pulmonary artery systolic  pressure. The estimated right ventricular systolic pressure is 46.6 mmHg.  3. Left atrial size was severely dilated.  4. The mitral valve is grossly normal. Mild mitral valve regurgitation.  No evidence of mitral stenosis.  5. Tricuspid valve regurgitation is moderate.  6. The aortic valve is abnormal. There is severe calcifcation of the  aortic valve. Aortic valve regurgitation is trivial. Moderate aortic valve  stenosis. Aortic valve mean gradient measures 25.2 mmHg.  7. Aortic dilatation noted. There is borderline dilatation of the  ascending aorta, measuring 39 mm.  8. The inferior vena cava is dilated in size with <50% respiratory  variability, suggesting right atrial pressure of 15 mmHg.  9. Cannot exclude atrial level shunt suggested by color flow  Doppler.   Discharge Instructions: Discharge Instructions    (HEART FAILURE PATIENTS) Call MD:  Anytime you have any of the following symptoms: 1) 3 pound weight gain in 24 hours or 5 pounds in 1 week 2) shortness of breath, with or without a dry hacking cough 3) swelling in the hands, feet or stomach 4) if you have to sleep on extra pillows at night in order to breathe.   Complete by: As directed    Call MD for:  difficulty breathing, headache or visual disturbances   Complete by: As directed    Call MD for:  extreme fatigue   Complete by: As directed    Call MD for:  persistant dizziness or light-headedness   Complete by: As directed    Call MD for:  persistant nausea and vomiting   Complete by: As directed    Diet - low sodium heart healthy   Complete by: As directed    Discharge instructions   Complete by: As directed    Ms. Melvyn Neth   You were admitted to the hospital due to bilateral leg pain and shortness of breath and were found to be in a heart failure exacerbation with very high blood pressures. Your heart was having new troubles pumping efficiently causing fluid to build up in your legs and causing you to feel short of breath. You were also noted to have symptoms concerning for worsening of your schizophrenia.   To help prevent these issues in the future and to keep your symptoms under control, it is very important that you do the following:   Start taking 1 tablet of each of the following in the morning:  - Lasix - Losartan - Spironolactone - Hydralazine - Imdur - Amlodipine - Carvedilol - Atorvastatin - Aspirin  - Ferrous gluconate - Olanzapine 10mg  disintegrating tablet   Start taking (an additional) 1 tablet of the following in the evening: - Hydralazine - Carvedilol  - Olanzapine 20mg  disintegrating tablet  Please STOP taking the following medications:  - Clonidine  - Hyzaar   I have scheduled you an appointment with your primary care doctor, Dr. ,  next Monday 05/24/20 at 2:45pm at [facility]. Please be sure to go to your appointment so that you can receive a refill of your medications above and to be sure that you do not need any medication adjustments. Call [phone] if you need to reschedule this appointment. You will also need an appointment for follow up with cardiology for your heart failure and a referral to psychiatry from your PCP.  If you experience worsening shortness of breath, swelling of your legs, weight gain, chest pain, increased anxiety or agitation, or thoughts of harming yourself or others or any other concerning symptoms, please call your primary care doctor or return to the Emergency Department right away.   It was a pleasure meeting you and we hope you continue to feel better.  Dr. [facility]   Increase activity slowly  Complete by: As directed       Signed: Glenford Bayley, MD 05/20/2020, 6:18 PM   Pager: [phone]

## 2020-05-20 NOTE — Progress Notes (Signed)
   Blood pressures and renal function somewhat normalized.  Urine output stabilized now back on standing dose diuretic.   [facility] will sign off.   Medication Recommendations:   Agree with Heart Failure Stewardship pharmacist recommendations-if renal function stabilizes, would add spironolactone 12.5 mg daily, and if blood pressure is higher  -> would increase to to 25 mg Continue current meds otherwise: Consider Farxiga/Jardiance on discharge (needs cost assessment)  Other recommendations (labs, testing, etc): With new start of spironolactone STEMI diuretic, but should have chemistry level checked in roughly 1 week. Follow up as an outpatient: We will arrange for outpatient follow-up, question of heart failure navigator team to be engaged to assist with outpatient monitoring as well.   Bryan Lemma, MD

## 2020-07-11 ENCOUNTER — Other Ambulatory Visit: Payer: Self-pay

## 2020-07-11 ENCOUNTER — Emergency Department (HOSPITAL_COMMUNITY)
Admission: EM | Admit: 2020-07-11 | Discharge: 2020-07-13 | Disposition: A | Discharge status: Other Acute Inpatient Hospital | Payer: Medicare Other | Attending: Emergency Medicine | Admitting: Emergency Medicine

## 2020-07-11 ENCOUNTER — Encounter (HOSPITAL_COMMUNITY): Payer: Self-pay

## 2020-07-11 DIAGNOSIS — Z20822 Contact with and (suspected) exposure to covid-19: Secondary | ICD-10-CM | POA: Diagnosis not present

## 2020-07-11 DIAGNOSIS — I2510 Atherosclerotic heart disease of native coronary artery without angina pectoris: Secondary | ICD-10-CM | POA: Insufficient documentation

## 2020-07-11 DIAGNOSIS — I130 Hypertensive heart and chronic kidney disease with heart failure and stage 1 through stage 4 chronic kidney disease, or unspecified chronic kidney disease: Secondary | ICD-10-CM | POA: Insufficient documentation

## 2020-07-11 DIAGNOSIS — Z79899 Other long term (current) drug therapy: Secondary | ICD-10-CM | POA: Diagnosis not present

## 2020-07-11 DIAGNOSIS — Z87891 Personal history of nicotine dependence: Secondary | ICD-10-CM | POA: Insufficient documentation

## 2020-07-11 DIAGNOSIS — F209 Schizophrenia, unspecified: Secondary | ICD-10-CM | POA: Insufficient documentation

## 2020-07-11 DIAGNOSIS — Z7982 Long term (current) use of aspirin: Secondary | ICD-10-CM | POA: Diagnosis not present

## 2020-07-11 DIAGNOSIS — I509 Heart failure, unspecified: Secondary | ICD-10-CM | POA: Diagnosis not present

## 2020-07-11 DIAGNOSIS — N183 Chronic kidney disease, stage 3 unspecified: Secondary | ICD-10-CM | POA: Insufficient documentation

## 2020-07-11 DIAGNOSIS — Z046 Encounter for general psychiatric examination, requested by authority: Secondary | ICD-10-CM | POA: Diagnosis present

## 2020-07-11 LAB — COMPREHENSIVE METABOLIC PANEL
ALT: 12 U/L (ref 0–44)
AST: 15 U/L (ref 15–41)
Albumin: 3.6 g/dL (ref 3.5–5.0)
Alkaline Phosphatase: 82 U/L (ref 38–126)
Anion gap: 9 (ref 5–15)
BUN: 25 mg/dL — ABNORMAL HIGH (ref 8–23)
CO2: 25 mmol/L (ref 22–32)
Calcium: 9.1 mg/dL (ref 8.9–10.3)
Chloride: 105 mmol/L (ref 98–111)
Creatinine, Ser: 1.33 mg/dL — ABNORMAL HIGH (ref 0.44–1.00)
GFR, Estimated: 43 mL/min — ABNORMAL LOW (ref >60)
Glucose, Bld: 105 mg/dL — ABNORMAL HIGH (ref 70–99)
Potassium: 3 mmol/L — ABNORMAL LOW (ref 3.5–5.1)
Sodium: 139 mmol/L (ref 135–145)
Total Bilirubin: 0.5 mg/dL (ref 0.3–1.2)
Total Protein: 7.5 g/dL (ref 6.5–8.1)

## 2020-07-11 LAB — CBC WITH DIFFERENTIAL/PLATELET
Abs Immature Granulocytes: 0.02 10*3/uL (ref 0.00–0.07)
Basophils Absolute: 0 10*3/uL (ref 0.0–0.1)
Basophils Relative: 0 %
Eosinophils Absolute: 0.2 10*3/uL (ref 0.0–0.5)
Eosinophils Relative: 2 %
HCT: 35.4 % — ABNORMAL LOW (ref 36.0–46.0)
Hemoglobin: 11 g/dL — ABNORMAL LOW (ref 12.0–15.0)
Immature Granulocytes: 0 %
Lymphocytes Relative: 28 %
Lymphs Abs: 2.1 10*3/uL (ref 0.7–4.0)
MCH: 28.1 pg (ref 26.0–34.0)
MCHC: 31.1 g/dL (ref 30.0–36.0)
MCV: 90.5 fL (ref 80.0–100.0)
Monocytes Absolute: 0.9 10*3/uL (ref 0.1–1.0)
Monocytes Relative: 12 %
Neutro Abs: 4.3 10*3/uL (ref 1.7–7.7)
Neutrophils Relative %: 58 %
Platelets: 289 10*3/uL (ref 150–400)
RBC: 3.91 MIL/uL (ref 3.87–5.11)
RDW: 15.2 % (ref 11.5–15.5)
WBC: 7.4 10*3/uL (ref 4.0–10.5)
nRBC: 0 % (ref 0.0–0.2)

## 2020-07-11 LAB — RESP PANEL BY RT-PCR (FLU A&B, COVID) ARPGX2
Influenza A by PCR: NEGATIVE
Influenza B by PCR: NEGATIVE
SARS Coronavirus 2 by RT PCR: NEGATIVE

## 2020-07-11 LAB — URINALYSIS, ROUTINE W REFLEX MICROSCOPIC
Bilirubin Urine: NEGATIVE
Glucose, UA: NEGATIVE mg/dL
Hgb urine dipstick: NEGATIVE
Ketones, ur: NEGATIVE mg/dL
Leukocytes,Ua: NEGATIVE
Nitrite: NEGATIVE
Protein, ur: 30 mg/dL — AB
Specific Gravity, Urine: 1.013 (ref 1.005–1.030)
pH: 6 (ref 5.0–8.0)

## 2020-07-11 LAB — LIPID PANEL
Cholesterol: 169 mg/dL (ref 0–200)
HDL: 46 mg/dL (ref >40)
LDL Cholesterol: 101 mg/dL — ABNORMAL HIGH (ref 0–99)
Total CHOL/HDL Ratio: 3.7 RATIO
Triglycerides: 108 mg/dL (ref <150)
VLDL: 22 mg/dL (ref 0–40)

## 2020-07-11 LAB — RAPID URINE DRUG SCREEN, HOSP PERFORMED
Amphetamines: NOT DETECTED
Barbiturates: NOT DETECTED
Benzodiazepines: NOT DETECTED
Cocaine: NOT DETECTED
Opiates: NOT DETECTED
Tetrahydrocannabinol: NOT DETECTED

## 2020-07-11 LAB — ACETAMINOPHEN LEVEL: Acetaminophen (Tylenol), Serum: <10 ug/mL — ABNORMAL LOW (ref 10–30)

## 2020-07-11 LAB — SALICYLATE LEVEL: Salicylate Lvl: <7 mg/dL — ABNORMAL LOW (ref 7.0–30.0)

## 2020-07-11 LAB — ETHANOL: Alcohol, Ethyl (B): <10 mg/dL (ref <10)

## 2020-07-11 MED ORDER — ASPIRIN EC 81 MG PO TBEC
81.0000 mg | DELAYED_RELEASE_TABLET | Freq: Every day | ORAL | Status: DC
  Administered 2020-07-12: 81 mg via ORAL
  Filled 2020-07-11 (×2): qty 1

## 2020-07-11 MED ORDER — CARVEDILOL 12.5 MG PO TABS
25.0000 mg | ORAL_TABLET | Freq: Two times a day (BID) | ORAL | Status: DC
  Administered 2020-07-11 – 2020-07-12 (×4): 25 mg via ORAL
  Filled 2020-07-11 (×4): qty 2

## 2020-07-11 MED ORDER — ZIPRASIDONE MESYLATE 20 MG IM SOLR: 20.0000 mg | INTRAMUSCULAR | Status: DC | PRN

## 2020-07-11 MED ORDER — OLANZAPINE 5 MG PO TBDP
5.0000 mg | ORAL_TABLET | Freq: Three times a day (TID) | ORAL | Status: DC | PRN
Start: 2020-07-11 — End: 2020-07-13

## 2020-07-11 MED ORDER — FUROSEMIDE 20 MG PO TABS
20.0000 mg | ORAL_TABLET | Freq: Every day | ORAL | Status: DC
Start: 2020-07-11 — End: 2020-07-13
  Administered 2020-07-11 – 2020-07-12 (×2): 20 mg via ORAL
  Filled 2020-07-11 (×3): qty 1

## 2020-07-11 MED ORDER — ATORVASTATIN CALCIUM 40 MG PO TABS
40.0000 mg | ORAL_TABLET | Freq: Every day | ORAL | Status: DC
  Administered 2020-07-11 – 2020-07-12 (×2): 40 mg via ORAL
  Filled 2020-07-11 (×2): qty 1

## 2020-07-11 MED ORDER — LORAZEPAM 1 MG PO TABS
1.0000 mg | ORAL_TABLET | ORAL | Status: DC | PRN
  Filled 2020-07-11: qty 1

## 2020-07-11 MED ORDER — FERROUS GLUCONATE 324 (38 FE) MG PO TABS
324.0000 mg | ORAL_TABLET | Freq: Every day | ORAL | Status: DC
Start: 2020-07-11 — End: 2020-07-13
  Filled 2020-07-11 (×3): qty 1

## 2020-07-11 MED ORDER — SPIRONOLACTONE 12.5 MG HALF TABLET
12.5000 mg | ORAL_TABLET | Freq: Every day | ORAL | Status: DC
  Administered 2020-07-12: 12.5 mg via ORAL
  Filled 2020-07-11 (×2): qty 1

## 2020-07-11 MED ORDER — ISOSORBIDE MONONITRATE ER 60 MG PO TB24
60.0000 mg | ORAL_TABLET | Freq: Every day | ORAL | Status: DC
  Administered 2020-07-12: 60 mg via ORAL
  Filled 2020-07-11 (×2): qty 1

## 2020-07-11 MED ORDER — AMLODIPINE BESYLATE 5 MG PO TABS
10.0000 mg | ORAL_TABLET | Freq: Every day | ORAL | Status: DC
  Administered 2020-07-11 – 2020-07-12 (×2): 10 mg via ORAL
  Filled 2020-07-11 (×2): qty 2

## 2020-07-11 MED ORDER — HYDRALAZINE HCL 50 MG PO TABS
100.0000 mg | ORAL_TABLET | Freq: Two times a day (BID) | ORAL | Status: DC
  Administered 2020-07-11 – 2020-07-12 (×3): 100 mg via ORAL
  Filled 2020-07-11 (×5): qty 2

## 2020-07-11 MED ORDER — LOSARTAN POTASSIUM 25 MG PO TABS
25.0000 mg | ORAL_TABLET | Freq: Every day | ORAL | Status: DC
  Administered 2020-07-11 – 2020-07-12 (×2): 25 mg via ORAL
  Filled 2020-07-11 (×3): qty 1

## 2020-07-11 MED ORDER — OLANZAPINE 10 MG PO TABS
10.0000 mg | ORAL_TABLET | Freq: Every day | ORAL | Status: DC
  Administered 2020-07-11 – 2020-07-12 (×2): 10 mg via ORAL
  Filled 2020-07-11 (×2): qty 1

## 2020-07-11 NOTE — Progress Notes (Signed)
Per Teodora Medici, patient meets criteria for inpatient treatment. There are no available or appropriate beds at [facility] today. CSW faxed referrals to the following facilities for review:  [facility]  [facility]   TTS will continue to seek bed placement.  Crissie Reese, MSW, LCSW-A, LCAS-A Phone: [phone] Disposition/TOC

## 2020-07-11 NOTE — ED Notes (Signed)
Pt dressed in hospital provided burgundy scrubs. Belongings gathered in 2 bags and placed in secure storage.
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
## 2020-07-11 NOTE — ED Notes (Signed)
Pt's dinner has arrived. Pt sat up and eating her dinner ?
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
## 2020-07-12 ENCOUNTER — Emergency Department (HOSPITAL_COMMUNITY): Payer: Medicare Other

## 2020-07-12 DIAGNOSIS — F209 Schizophrenia, unspecified: Secondary | ICD-10-CM | POA: Diagnosis not present

## 2020-07-12 LAB — BASIC METABOLIC PANEL
Anion gap: 8 (ref 5–15)
BUN: 27 mg/dL — ABNORMAL HIGH (ref 8–23)
CO2: 27 mmol/L (ref 22–32)
Calcium: 8.8 mg/dL — ABNORMAL LOW (ref 8.9–10.3)
Chloride: 104 mmol/L (ref 98–111)
Creatinine, Ser: 1.29 mg/dL — ABNORMAL HIGH (ref 0.44–1.00)
GFR, Estimated: 45 mL/min — ABNORMAL LOW (ref >60)
Glucose, Bld: 105 mg/dL — ABNORMAL HIGH (ref 70–99)
Potassium: 3.4 mmol/L — ABNORMAL LOW (ref 3.5–5.1)
Sodium: 139 mmol/L (ref 135–145)

## 2020-07-12 MED ORDER — POTASSIUM CHLORIDE CRYS ER 20 MEQ PO TBCR
20.0000 meq | EXTENDED_RELEASE_TABLET | Freq: Once | ORAL | Status: AC
  Administered 2020-07-12: 20 meq via ORAL
  Filled 2020-07-12: qty 1

## 2020-07-12 NOTE — BH Assessment (Addendum)
Followed up with nursing Waynetta Sandy, RN), regarding patient's refusal to transfer to [facility] for psychiatric stabilization. Beth, RN states that EDP (Dr. Tanna Savoy) will initiate patient's IVC. Patient's disposition remains as follows:   Per Disposition Counselor, Doylene Canning, "At 16:38 this Clinical research associate spoke to [place] at [facility].  Pt has been accepted to their facility by Dr Joycelyn Rua.  Please call report to [phone].  Pt is currently voluntary, but plan needs to be discussed with her and pt needs to agree to transfer in order to go voluntarily by [employer].  Otherwise, pt will need to be placed under IVC and will be transported via [facility].  EDP Gwyneth Sprout, MD and pt's nurse, Waynetta Sandy, have been notified."  Also, followed up with patient's current nurse Reita Cliche, RN) via secure chat to insure that patient's IVC was served. Reita Cliche, RN confirmed that IVC papers were served and sheriff has been contacted for transfer 07/13/20.

## 2020-07-12 NOTE — ED Provider Notes (Signed)
Emergency Medicine Observation Re-evaluation Note  Erin Riley is a 70 y.o. female, seen on rounds today.  Pt initially presented to the ED for complaints of Psychiatric Evaluation Currently, the patient is sleeping comfortably.  Physical Exam  BP (!) 154/88 (BP Location: Left Arm)   Pulse 81   Temp 98 F (36.7 C) (Oral)   Resp 19   Ht 5\' 7"  (1.702 m)   Wt 103 kg   LMP 05/10/2013   SpO2 96%   BMI 35.56 kg/m  Physical Exam General: NAD, Asleep, comfortable   ED Course / MDM  EKG:EKG Interpretation  Date/Time:  Sunday Jul 11 2020 18:07:24 EDT Ventricular Rate:  74 PR Interval:  180 QRS Duration: 104 QT Interval:  388 QTC Calculation: 430 R Axis:   -7 Text Interpretation: Sinus rhythm with marked sinus arrhythmia Moderate voltage criteria for LVH, may be normal variant ( R in aVL , Cornell product ) Nonspecific T wave abnormality  noted on prior tracings in 2021 Confirmed by 2022 [phone]) on 07/11/2020 6:17:45 PM   I have reviewed the labs performed to date as well as medications administered while in observation.  Recent changes in the last 24 hours include no acute events overnight reported by staff.  Plan  Current plan is for psych evaluation and placement.    09/10/2020, MD 07/12/20 [phone]

## 2020-07-12 NOTE — ED Notes (Signed)
Breakfast tray was given.  

## 2020-07-12 NOTE — ED Provider Notes (Signed)
Emergency Medicine Observation Re-evaluation Note  Erin Riley is a 70 y.o. female, seen on rounds today.  Pt initially presented to the ED for complaints of Psychiatric Evaluation Currently, the patient is stable.  Physical Exam    ED Course / MDM  EKG:EKG Interpretation  Date/Time:  Sunday Jul 11 2020 18:07:24 EDT Ventricular Rate:  74 PR Interval:  180 QRS Duration: 104 QT Interval:  388 QTC Calculation: 430 R Axis:   -7 Text Interpretation: Sinus rhythm with marked sinus arrhythmia Moderate voltage criteria for LVH, may be normal variant ( R in aVL , Cornell product ) Nonspecific T wave abnormality  noted on prior tracings in 2021 Confirmed by Alvester Chou [phone]) on 07/11/2020 6:17:45 PM   I have reviewed the labs performed to date as well as medications administered while in observation.  Recent changes in the last 24 hours include new BMP with potassium improved at 3.4.  No intervention needed at this time and pt is medically clear.  Plan  Current plan is for inpt psych placement.    Gwyneth Sprout, MD 07/12/20 1620

## 2020-07-12 NOTE — ED Notes (Signed)
Pt alert this shift. Medication compliant. Cooperative. Denies SI/HI. No delusions noted.

## 2020-07-12 NOTE — BH Assessment (Signed)
Patient was seen this date to assess current mental health status. Patient contnues to be disorganized and speaks at length in reference to "the doctor who is helping with my money." Patient will not elaborate on the content of statement although reports she has "legal papers in her bag that need to go to court." Patient is difficult to direct although denies any S/I or H/I. Patient reports ongoing AH although is vague in reference to content and denies VH. Effie Shy NP recommends a continues inpatient admission as appropriate bed placement is investigated.

## 2020-07-12 NOTE — BH Assessment (Addendum)
[facility] Assessment Progress Note   Per Vernard Gambles, NP, this voluntary pt requires psychiatric hospitalization at this time.  Please note that Eber Jones finds that pt meets criteria for IVC if necessary.  Pt has been referred to [facility], and this writer has been in communication with them throughout the day in order to facilitate transfer.  As of this writing they are asking that pt's potassium level be address, which I have brought to the attention of EDP Kristine Royal, MD.  Reviewing pt's note I found that [facility] staff had referred pt to [facility] ACT Team in 05/2020.  At 14:13 I called them to see if pt ever followed through.  They report that pt is not on their service at this time.  If this voluntary pt is accepted to a facility, please discuss disposition with pt to be sure that she agrees to the plan.  If a facility agrees to accept pt and the plan changes in any way please call the facility to inform them of the change.  Final disposition is pending as of this writing.  Doylene Canning, [place] Behavioral Health Coordinator [phone]   Addendum:  At 16:38 this writer spoke to [place] at [facility].  Pt has been accepted to their facility by Dr Joycelyn Rua.  Please call report to [phone].  Pt is currently voluntary, but plan needs to be discussed with her and pt needs to agree to transfer in order to go voluntarily by [employer].  Otherwise, pt will need to be placed under IVC and will be transported via [facility].  EDP Gwyneth Sprout, MD and pt's nurse, Beth, have been notified.  Doylene Canning, [place] Behavioral Health Coordinator [phone]

## 2020-07-12 NOTE — ED Notes (Signed)
Pt refused to go to [facility]. Provider  Notified.

## 2020-07-13 NOTE — Discharge Instructions (Addendum)
Patient is to go to [facility].   Dr. Caren Macadam excepting

## 2020-09-10 ENCOUNTER — Emergency Department (HOSPITAL_COMMUNITY)
Admission: EM | Admit: 2020-09-10 | Discharge: 2020-09-10 | Disposition: A | Discharge status: Home / Self Care | Payer: Medicare Other | Attending: Emergency Medicine | Admitting: Emergency Medicine

## 2020-09-10 ENCOUNTER — Encounter (HOSPITAL_COMMUNITY): Payer: Self-pay | Admitting: Emergency Medicine

## 2020-09-10 ENCOUNTER — Other Ambulatory Visit: Payer: Self-pay

## 2020-09-10 DIAGNOSIS — I2510 Atherosclerotic heart disease of native coronary artery without angina pectoris: Secondary | ICD-10-CM | POA: Insufficient documentation

## 2020-09-10 DIAGNOSIS — I129 Hypertensive chronic kidney disease with stage 1 through stage 4 chronic kidney disease, or unspecified chronic kidney disease: Secondary | ICD-10-CM | POA: Diagnosis not present

## 2020-09-10 DIAGNOSIS — Z87891 Personal history of nicotine dependence: Secondary | ICD-10-CM | POA: Diagnosis not present

## 2020-09-10 DIAGNOSIS — R443 Hallucinations, unspecified: Secondary | ICD-10-CM | POA: Diagnosis present

## 2020-09-10 DIAGNOSIS — I10 Essential (primary) hypertension: Secondary | ICD-10-CM

## 2020-09-10 DIAGNOSIS — N183 Chronic kidney disease, stage 3 unspecified: Secondary | ICD-10-CM | POA: Insufficient documentation

## 2020-09-10 DIAGNOSIS — Z7982 Long term (current) use of aspirin: Secondary | ICD-10-CM | POA: Diagnosis not present

## 2020-09-10 DIAGNOSIS — Z79899 Other long term (current) drug therapy: Secondary | ICD-10-CM | POA: Insufficient documentation

## 2020-09-10 LAB — CBC WITH DIFFERENTIAL/PLATELET
Abs Immature Granulocytes: 0.02 10*3/uL (ref 0.00–0.07)
Basophils Absolute: 0 10*3/uL (ref 0.0–0.1)
Basophils Relative: 0 %
Eosinophils Absolute: 0.1 10*3/uL (ref 0.0–0.5)
Eosinophils Relative: 3 %
HCT: 40.1 % (ref 36.0–46.0)
Hemoglobin: 12.5 g/dL (ref 12.0–15.0)
Immature Granulocytes: 0 %
Lymphocytes Relative: 33 %
Lymphs Abs: 1.9 10*3/uL (ref 0.7–4.0)
MCH: 28.9 pg (ref 26.0–34.0)
MCHC: 31.2 g/dL (ref 30.0–36.0)
MCV: 92.6 fL (ref 80.0–100.0)
Monocytes Absolute: 0.5 10*3/uL (ref 0.1–1.0)
Monocytes Relative: 8 %
Neutro Abs: 3.2 10*3/uL (ref 1.7–7.7)
Neutrophils Relative %: 56 %
Platelets: 334 10*3/uL (ref 150–400)
RBC: 4.33 MIL/uL (ref 3.87–5.11)
RDW: 15.9 % — ABNORMAL HIGH (ref 11.5–15.5)
WBC: 5.7 10*3/uL (ref 4.0–10.5)
nRBC: 0 % (ref 0.0–0.2)

## 2020-09-10 LAB — ETHANOL: Alcohol, Ethyl (B): <10 mg/dL (ref <10)

## 2020-09-10 LAB — COMPREHENSIVE METABOLIC PANEL
ALT: 14 U/L (ref 0–44)
AST: 19 U/L (ref 15–41)
Albumin: 3.7 g/dL (ref 3.5–5.0)
Alkaline Phosphatase: 78 U/L (ref 38–126)
Anion gap: 7 (ref 5–15)
BUN: 20 mg/dL (ref 8–23)
CO2: 26 mmol/L (ref 22–32)
Calcium: 9.5 mg/dL (ref 8.9–10.3)
Chloride: 107 mmol/L (ref 98–111)
Creatinine, Ser: 1.31 mg/dL — ABNORMAL HIGH (ref 0.44–1.00)
GFR, Estimated: 44 mL/min — ABNORMAL LOW (ref >60)
Glucose, Bld: 107 mg/dL — ABNORMAL HIGH (ref 70–99)
Potassium: 3.9 mmol/L (ref 3.5–5.1)
Sodium: 140 mmol/L (ref 135–145)
Total Bilirubin: 0.9 mg/dL (ref 0.3–1.2)
Total Protein: 7.7 g/dL (ref 6.5–8.1)

## 2020-09-10 MED ORDER — ISOSORBIDE MONONITRATE ER 60 MG PO TB24: ORAL_TABLET | Freq: Every day | ORAL | 0 refills | Status: DC

## 2020-09-10 MED ORDER — HYDRALAZINE HCL 100 MG PO TABS: ORAL_TABLET | ORAL | 0 refills | Status: DC

## 2020-09-10 MED ORDER — AMLODIPINE BESYLATE 10 MG PO TABS: 10.0000 mg | ORAL_TABLET | Freq: Every day | ORAL | 0 refills | Status: DC

## 2020-09-10 NOTE — ED Notes (Signed)
Reviewed discharge instructions with patient. Follow-up care and medications reviewed. Patient denies having HTN when I explained her diagnosis today was HTN. Pt stated, "I don't have that because of all this they did", pointing to her left arm. Patient A&Ox4, VSS, and ambulatory with steady gait upon discharge.
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
## 2020-09-10 NOTE — Discharge Instructions (Addendum)
Return for any problem.  ?
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
## 2021-01-26 ENCOUNTER — Telehealth: Payer: Self-pay | Admitting: Physician Assistant

## 2021-01-26 ENCOUNTER — Encounter (INDEPENDENT_AMBULATORY_CARE_PROVIDER_SITE_OTHER): Payer: Self-pay

## 2021-01-26 ENCOUNTER — Ambulatory Visit (INDEPENDENT_AMBULATORY_CARE_PROVIDER_SITE_OTHER): Payer: Medicare Other | Admitting: Physician Assistant

## 2021-01-26 ENCOUNTER — Other Ambulatory Visit: Payer: Self-pay

## 2021-01-26 ENCOUNTER — Encounter: Payer: Self-pay | Admitting: Physician Assistant

## 2021-01-26 VITALS — BP 207/126 | HR 99 | Temp 98.3°F | Resp 16 | Ht 67.0 in | Wt 226.0 lb

## 2021-01-26 DIAGNOSIS — F203 Undifferentiated schizophrenia: Secondary | ICD-10-CM

## 2021-01-26 DIAGNOSIS — I169 Hypertensive crisis, unspecified: Secondary | ICD-10-CM | POA: Diagnosis not present

## 2021-01-26 DIAGNOSIS — I5189 Other ill-defined heart diseases: Secondary | ICD-10-CM

## 2021-01-26 DIAGNOSIS — R739 Hyperglycemia, unspecified: Secondary | ICD-10-CM

## 2021-01-26 DIAGNOSIS — Z5900 Homelessness unspecified: Secondary | ICD-10-CM

## 2021-01-26 DIAGNOSIS — Z91199 Patient's noncompliance with other medical treatment and regimen due to unspecified reason: Secondary | ICD-10-CM

## 2021-01-26 DIAGNOSIS — E785 Hyperlipidemia, unspecified: Secondary | ICD-10-CM

## 2021-01-26 MED ORDER — HYDRALAZINE HCL 100 MG PO TABS: ORAL_TABLET | ORAL | 1 refills | Status: DC

## 2021-01-26 MED ORDER — SPIRONOLACTONE 25 MG PO TABS
12.5000 mg | ORAL_TABLET | Freq: Every day | ORAL | 1 refills | Status: DC
Start: 2021-01-26 — End: 2021-02-15

## 2021-01-26 MED ORDER — ATORVASTATIN CALCIUM 40 MG PO TABS: 40.0000 mg | ORAL_TABLET | Freq: Every day | ORAL | 1 refills | Status: DC

## 2021-01-26 MED ORDER — FUROSEMIDE 20 MG PO TABS: 20.0000 mg | ORAL_TABLET | Freq: Every day | ORAL | 1 refills | Status: DC

## 2021-01-26 MED ORDER — AMLODIPINE BESYLATE 10 MG PO TABS: 10.0000 mg | ORAL_TABLET | Freq: Every day | ORAL | 1 refills | Status: DC

## 2021-01-26 MED ORDER — ISOSORBIDE MONONITRATE ER 60 MG PO TB24: ORAL_TABLET | Freq: Every day | ORAL | 1 refills | Status: DC

## 2021-01-26 NOTE — Progress Notes (Signed)
Patient ID: Erin Riley, female   DOB: December 01, 1950, 70 y.o.   MRN: [id number]   Erin Riley, is a 70 y.o. female  YQM:[id number]  BMW:[id number]  DOB - 05-03-1950  Chief Complaint  Patient presents with   Establish Care       Subjective:   Erin Riley is a 70 y.o. female here today to establish care. Her son Erin Riley is with her today.  She is out of all of her medications.  She declines clonidine in office despite me discussing the risks of uncontrolled BP.  She is currently homeless.  Her son says they need help with getting her housing and BH help, making sure she is taking her medications.  His number is [phone].  She denies any medical complaints today.  She was able to review her med list with me and show me what she is supposed to be taking.   Patient has No headache, No chest pain, No abdominal pain - No Nausea, No new weakness tingling or numbness, No Cough - SOB.  No problems updated.  ALLERGIES: No Known Allergies  PAST MEDICAL HISTORY: Past Medical History:  Diagnosis Date   Chronic back pain    Chronic kidney disease (CKD), stage II (mild)    Class I-II   Coronary artery disease 04/2009   50% stenosis in the perforator of LAD; catheterization was for an abnormal Myoview in January 2000 showing anterior and inferolateral ischemia.   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 > 3 subsequent Echos show improved/normal EF with moderate to severe concentric LVH and diastolic dysfunction with LVOT/intracavitary gradient --> 06/2016: Severe LVH.  Vigorous EF, 65-70%.?? Gr 1 DD. Mild AS.   Hyperlipidemia    Hypertension    Hypertensive hypertrophic cardiomyopathy: NYHA class II:  Echo: Severe concentric LVH with LV OT gradient; essentially preserved EF with diastolic dysfunction 02/15/2013   Echo 06/2016: Severe Concentric LVH. Vigorous EF 65-70%. ~ Gr I DD.     Mild aortic stenosis by prior echocardiography    Echo 06/2016: Mild AS (Mean Gradient 15 mmHg); has had prior Mod-Severe MR (not seen on current echo)   PAD (peripheral artery disease) (HCC) March 2013   Lower extremity Dopplers: R. SFA 50-60%, R. PTA proximally occluded with distal reconstitution;; L. common iliac ~50%, L. SFA 50-70% stenosis, L. PTA < 50%   Schizophrenia (HCC)     MEDICATIONS AT HOME: Prior to Admission medications   Medication Sig Start Date End Date Taking? Authorizing Provider  amLODipine (NORVASC) 10 MG tablet Take 1 tablet (10 mg total) by mouth daily. 01/26/21   Anders Simmonds, PA-C  aspirin EC 81 MG EC tablet Take 1 tablet (81 mg total) by mouth daily. Swallow whole. Patient not taking: Reported on 09/10/2020 05/21/20   Glenford Bayley, MD  atorvastatin (LIPITOR) 40 MG tablet Take 1 tablet (40 mg total) by mouth daily. 01/26/21   Anders Simmonds, PA-C  carvedilol (COREG) 25 MG tablet Take 1 tablet (25 mg total) by mouth 2 (two) times daily with a meal. Patient not taking: Reported on 09/10/2020 05/20/20   Glenford Bayley, MD  ferrous gluconate (FERGON) 324 MG tablet Take 1 tablet (324 mg total) by mouth daily with breakfast. Patient not taking: Reported on 09/10/2020 05/21/20   Glenford Bayley, MD  furosemide (LASIX) 20 MG tablet Take 1 tablet (20 mg total) by mouth daily. 01/26/21  Freeman Caldron M, PA-C  hydrALAZINE (APRESOLINE) 100 MG tablet TAKE 1 TABLET (100 MG TOTAL) BY MOUTH TWO TIMES DAILY. 01/26/21 01/26/22  Argentina Donovan, PA-C  isosorbide mononitrate (IMDUR) 60 MG 24 hr tablet TAKE 1 TABLET (60 MG TOTAL) BY MOUTH DAILY. 01/26/21 01/26/22  Argentina Donovan, PA-C  OLANZapine zydis (ZYPREXA) 10 MG disintegrating tablet Take 1 tablet (10 mg total) by mouth in the morning. Patient not taking: Reported on 09/10/2020 05/20/20   Jeralyn Bennett, MD  OLANZapine zydis (ZYPREXA) 20 MG disintegrating tablet Take 1 tablet (20 mg total) by mouth at bedtime. Patient  not taking: Reported on 09/10/2020 05/20/20   Jeralyn Bennett, MD  spironolactone (ALDACTONE) 25 MG tablet TAKE 1/2 TABLET (12.5 MG TOTAL) BY MOUTH DAILY. 01/26/21 01/26/22  Argentina Donovan, PA-C    ROS: Neg HEENT Neg resp Neg cardiac Neg GI Neg GU Neg MS Neg psych Neg neuro  Objective:   Vitals:   01/26/21 1035  BP: (!) 207/126  Pulse: 99  Resp: 16  Temp: 98.3 F (36.8 C)  TempSrc: Oral  SpO2: 96%  Weight: 226 lb (102.5 kg)  Height: 5\' 7"  (1.702 m)   Exam General appearance : Awake, alert, not in any distress. Speech Clear at times and garbled at times. Not toxic looking.  TP not linear.   HEENT: Atraumatic and Normocephalic Neck: Supple, no JVD. No cervical lymphadenopathy.  Chest: Good air entry bilaterally, CTAB.  No rales/rhonchi/wheezing CVS: S1 S2 regular, no murmurs.  Neurology: Awake alert, and oriented X 3, CN II-XII intact, Non focal Skin: No Rash  Data Review Lab Results  Component Value Date   HGBA1C 5.2 05/13/2020   HGBA1C 5.9 (H) 12/18/2019   HGBA1C 5.9 (H) 06/09/2016    Assessment & Plan   1. Hypertensive crisis Resume meds-her son assures me they will go immediately from here to get meds.  She was initially combative and uncooperative but settled on exam but would not let me check her BP again.   - amLODipine (NORVASC) 10 MG tablet; Take 1 tablet (10 mg total) by mouth daily.  Dispense: 90 tablet; Refill: 1 - hydrALAZINE (APRESOLINE) 100 MG tablet; TAKE 1 TABLET (100 MG TOTAL) BY MOUTH TWO TIMES DAILY.  Dispense: 180 tablet; Refill: 1 - isosorbide mononitrate (IMDUR) 60 MG 24 hr tablet; TAKE 1 TABLET (60 MG TOTAL) BY MOUTH DAILY.  Dispense: 90 tablet; Refill: 1  2. Hyperlipidemia with target LDL less than 100 Resumed meds - atorvastatin (LIPITOR) 40 MG tablet; Take 1 tablet (40 mg total) by mouth daily.  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  His number is [phone].  4. Non-compliant patient Needs assistance - Ambulatory referral to Social Work  5. Homeless - Ambulatory referral to Social Work Barriers to care and compliance Her son says they need help with getting her housing and BH help, making sure she is taking her medications.  His number is [phone].  6. Left ventricular diastolic dysfunction, NYHA class 1 - spironolactone (ALDACTONE) 25 MG tablet; TAKE 1/2 TABLET (12.5 MG TOTAL) BY MOUTH DAILY.  Dispense: 45 tablet; Refill: 1 - furosemide (LASIX) 20 MG tablet; Take 1 tablet (20 mg total) by mouth daily.  Dispense: 90 tablet; Refill: 1  Patient have been counseled extensively about nutrition and exercise. Other issues discussed during this visit include: low cholesterol diet, weight control and daily exercise, foot care, annual eye examinations at Ophthalmology, importance of adherence with medications and regular follow-up. We also discussed long term complications of uncontrolled diabetes and hypertension.   Return for [facility] in 3 weeks with Lurena Joiner for BP/labs and 2 months assign PCP here.  The patient was given clear instructions to go to ER or return to medical center if symptoms don't improve, worsen or new problems develop. The patient verbalized understanding. The patient was told to call to get lab results if they haven't heard anything in the next week.      Freeman Caldron, PA-C [facility] and [facility], [place]   01/26/2021, 11:53 AM

## 2021-01-26 NOTE — Progress Notes (Signed)
Patient is here with son . Patient son is trying to help his mom get settle with a provider and get the help she needs.

## 2021-02-04 ENCOUNTER — Telehealth: Payer: Self-pay | Admitting: Physician Assistant

## 2021-02-07 ENCOUNTER — Telehealth: Payer: Self-pay

## 2021-02-07 NOTE — Telephone Encounter (Signed)
Copied from CRM [id number]. Topic: General - Other >> Feb 04, 2021  2:40 PM Eliseo Gum, [place] wrote: Reason for [id number] son called in about speaking with [facility] for assisted living Please call back.

## 2021-02-14 NOTE — Telephone Encounter (Signed)
Patient son called Erin Riley say that he have been waiting on a call back from the office for about 3 weeks. Patient need an appointment with the social worker she is homeless [place] streets and son Erin asking for a call back please ASAP at Ph#  [phone]

## 2021-02-15 ENCOUNTER — Other Ambulatory Visit: Payer: Self-pay

## 2021-02-15 ENCOUNTER — Ambulatory Visit: Payer: Medicare Other | Attending: Family Medicine | Admitting: Pharmacist

## 2021-02-15 ENCOUNTER — Encounter: Payer: Self-pay | Admitting: Pharmacist

## 2021-02-15 DIAGNOSIS — E785 Hyperlipidemia, unspecified: Secondary | ICD-10-CM | POA: Diagnosis not present

## 2021-02-15 DIAGNOSIS — I5189 Other ill-defined heart diseases: Secondary | ICD-10-CM

## 2021-02-15 DIAGNOSIS — I169 Hypertensive crisis, unspecified: Secondary | ICD-10-CM

## 2021-02-15 DIAGNOSIS — R739 Hyperglycemia, unspecified: Secondary | ICD-10-CM | POA: Diagnosis not present

## 2021-02-15 MED ORDER — ATORVASTATIN CALCIUM 40 MG PO TABS
40.0000 mg | ORAL_TABLET | Freq: Every day | ORAL | 1 refills | Status: DC
  Filled 2021-02-15: qty 90, 90d supply, fill #0

## 2021-02-15 MED ORDER — AMLODIPINE BESYLATE 10 MG PO TABS
10.0000 mg | ORAL_TABLET | Freq: Every day | ORAL | 1 refills | Status: DC
  Filled 2021-02-15: qty 90, 90d supply, fill #0

## 2021-02-15 MED ORDER — ISOSORBIDE MONONITRATE ER 60 MG PO TB24
ORAL_TABLET | Freq: Every day | ORAL | 1 refills | Status: DC
  Filled 2021-02-15: qty 90, 90d supply, fill #0

## 2021-02-15 MED ORDER — CARVEDILOL 25 MG PO TABS
25.0000 mg | ORAL_TABLET | Freq: Two times a day (BID) | ORAL | 0 refills | Status: DC
  Filled 2021-02-15: qty 60, 30d supply, fill #0

## 2021-02-15 MED ORDER — HYDRALAZINE HCL 100 MG PO TABS
ORAL_TABLET | ORAL | 1 refills | Status: DC
  Filled 2021-02-15: qty 180, 90d supply, fill #0

## 2021-02-15 MED ORDER — SPIRONOLACTONE 25 MG PO TABS
12.5000 mg | ORAL_TABLET | Freq: Every day | ORAL | 1 refills | Status: DC
  Filled 2021-02-15: qty 45, 90d supply, fill #0

## 2021-02-15 MED ORDER — FUROSEMIDE 20 MG PO TABS
20.0000 mg | ORAL_TABLET | Freq: Every day | ORAL | 1 refills | Status: DC
  Filled 2021-02-15: qty 90, 90d supply, fill #0

## 2021-02-15 NOTE — Telephone Encounter (Signed)
I attempted to call pt's son, no answer, left vm.

## 2021-02-15 NOTE — Progress Notes (Signed)
° °  S:    Patient arrives in good spirits. Presents to the clinic for hypertension evaluation, counseling, and management. Patient was referred on 01/26/2021 by Marylene Land. BP was 207/126 at that appointment.   Medication adherence denied. She did not pick up any of her medications.   Current BP Medications include:  amlodipine 10 mg daily, carvedilol 25 mg BID, furosemide 20 mg daily, spironolactone 12.5 mg daiy, hydralazine 100 mg BID  Dietary habits include: tells me that she is only able to eat what she can get d/t her living situation. Will drink soda occasionally but denies excessive caffeine intake.  Exercise habits include: none outside of walking  Family / Social history:  - Fhx: HTN - Tobacco: some day smoker. Tells me a pack will last her 3-4 days.  - Alcohol: none reported   O:  Vitals:   02/15/21 1151  BP: (!) 162/122   Home BP readings: none  Last 3 Office BP readings: BP Readings from Last 3 Encounters:  01/26/21 (!) 207/126  09/10/20 (!) 224/125  07/13/20 (!) 164/84    BMET    Component Value Date/Time   NA 140 09/10/2020 1109   NA 139 08/31/2017 1043   K 3.9 09/10/2020 1109   CL 107 09/10/2020 1109   CO2 26 09/10/2020 1109   GLUCOSE 107 (H) 09/10/2020 1109   BUN 20 09/10/2020 1109   BUN 15 08/31/2017 1043   CREATININE 1.31 (H) 09/10/2020 1109   CREATININE 1.21 (H) 06/15/2014 1025   CALCIUM 9.5 09/10/2020 1109   GFRNONAA 44 (L) 09/10/2020 1109   GFRAA 42 (L) 12/09/2019 0031    Renal function: CrCl cannot be calculated (Patient's most recent lab result is older than the maximum 21 days allowed.).  Clinical ASCVD: No  The ASCVD Risk score (Arnett DK, et al., 2019) failed to calculate for the following reasons:   The valid systolic blood pressure range is 90 to 200 mmHg    A/P: Hypertension longstanding currently uncontrolled on current medications. BP Goal = < 130/80 mmHg. Medication adherence denied. Transportation is an issue for her. Will send rxs  to our pharmacy. Pt is agreeable to picking these up today.   -Continued current regimen. Encouraged compliance.  -F/u labs ordered - labs per Marylene Land -Counseled on lifestyle modifications for blood pressure control including reduced dietary sodium, increased exercise, adequate sleep.  Results reviewed and written information provided.   Total time in face-to-face counseling 20 minutes.   F/U Clinic Visit in 03/28/2020 to establish care with Dr. Andrey Campanile.   Butch Penny, PharmD, [place], CPP Clinical Pharmacist [facility] [phone]

## 2021-02-16 ENCOUNTER — Telehealth: Payer: Self-pay

## 2021-02-16 ENCOUNTER — Ambulatory Visit: Payer: Medicare Other | Attending: Family Medicine | Admitting: Family Medicine

## 2021-02-16 VITALS — BP 195/116 | HR 99 | Ht 67.0 in | Wt 231.2 lb

## 2021-02-16 DIAGNOSIS — Z9114 Patient's other noncompliance with medication regimen: Secondary | ICD-10-CM | POA: Diagnosis not present

## 2021-02-16 DIAGNOSIS — I119 Hypertensive heart disease without heart failure: Secondary | ICD-10-CM

## 2021-02-16 DIAGNOSIS — F200 Paranoid schizophrenia: Secondary | ICD-10-CM | POA: Diagnosis not present

## 2021-02-16 DIAGNOSIS — Z5900 Homelessness unspecified: Secondary | ICD-10-CM | POA: Diagnosis not present

## 2021-02-16 DIAGNOSIS — I10 Essential (primary) hypertension: Secondary | ICD-10-CM

## 2021-02-16 DIAGNOSIS — I422 Other hypertrophic cardiomyopathy: Secondary | ICD-10-CM

## 2021-02-16 LAB — COMPREHENSIVE METABOLIC PANEL
ALT: 10 IU/L (ref 0–32)
AST: 13 IU/L (ref 0–40)
Albumin/Globulin Ratio: 1.1 — ABNORMAL LOW (ref 1.2–2.2)
Albumin: 3.9 g/dL (ref 3.8–4.8)
Alkaline Phosphatase: 98 IU/L (ref 44–121)
BUN/Creatinine Ratio: 12 (ref 12–28)
BUN: 16 mg/dL (ref 8–27)
Bilirubin Total: 0.8 mg/dL (ref 0.0–1.2)
CO2: 24 mmol/L (ref 20–29)
Calcium: 9.5 mg/dL (ref 8.7–10.3)
Chloride: 104 mmol/L (ref 96–106)
Creatinine, Ser: 1.32 mg/dL — ABNORMAL HIGH (ref 0.57–1.00)
Globulin, Total: 3.4 g/dL (ref 1.5–4.5)
Glucose: 105 mg/dL — ABNORMAL HIGH (ref 70–99)
Potassium: 4.4 mmol/L (ref 3.5–5.2)
Sodium: 140 mmol/L (ref 134–144)
Total Protein: 7.3 g/dL (ref 6.0–8.5)
eGFR: 44 mL/min/{1.73_m2} — ABNORMAL LOW (ref >59)

## 2021-02-16 LAB — CBC WITH DIFFERENTIAL/PLATELET
Basophils Absolute: 0 10*3/uL (ref 0.0–0.2)
Basos: 0 %
EOS (ABSOLUTE): 0.3 10*3/uL (ref 0.0–0.4)
Eos: 4 %
Hematocrit: 32.5 % — ABNORMAL LOW (ref 34.0–46.6)
Hemoglobin: 10.4 g/dL — ABNORMAL LOW (ref 11.1–15.9)
Immature Grans (Abs): 0 10*3/uL (ref 0.0–0.1)
Immature Granulocytes: 0 %
Lymphocytes Absolute: 1.8 10*3/uL (ref 0.7–3.1)
Lymphs: 25 %
MCH: 28.6 pg (ref 26.6–33.0)
MCHC: 32 g/dL (ref 31.5–35.7)
MCV: 89 fL (ref 79–97)
Monocytes Absolute: 0.6 10*3/uL (ref 0.1–0.9)
Monocytes: 8 %
Neutrophils Absolute: 4.6 10*3/uL (ref 1.4–7.0)
Neutrophils: 63 %
Platelets: 360 10*3/uL (ref 150–450)
RBC: 3.64 x10E6/uL — ABNORMAL LOW (ref 3.77–5.28)
RDW: 12.7 % (ref 11.7–15.4)
WBC: 7.4 10*3/uL (ref 3.4–10.8)

## 2021-02-16 LAB — LIPID PANEL
Chol/HDL Ratio: 4.2 ratio (ref 0.0–4.4)
Cholesterol, Total: 158 mg/dL (ref 100–199)
HDL: 38 mg/dL — ABNORMAL LOW (ref >39)
LDL Chol Calc (NIH): 101 mg/dL — ABNORMAL HIGH (ref 0–99)
Triglycerides: 103 mg/dL (ref 0–149)
VLDL Cholesterol Cal: 19 mg/dL (ref 5–40)

## 2021-02-16 LAB — HEMOGLOBIN A1C
Est. average glucose Bld gHb Est-mCnc: 111 mg/dL
Hgb A1c MFr Bld: 5.5 % (ref 4.8–5.6)

## 2021-02-16 NOTE — Telephone Encounter (Signed)
Met with the patient and Nadara Mustard, her son's father,  when she was in the clinic today.  Per the patient, they have not been together for 30 years. He said he is here today with her to  help their son who is working 2 jobs. Her son has been her primary caregiver and he is seeking assistance with housing and behavioral health resources for his mother.Per patient, she was living with her son, but she is now in a motel.    The patient and Nadara Mustard were arguing the entire time this CM was trying to speak with them.  The patient could not focus on answering questions about housing needs or behavioral health support. She would start yelling at [facility] or this CM when asked a question.  After multiple attempts to try to speak with her she wanted to leave.    Howard asked about help with her - housing, ALF.  This CM explained that we are not able to simply place her in a facility. We need to determine what facility/residence  would be most appropriate to support her behavioral health needs. It is important that our SW speak with her and with her son.  Explained to Nadara Mustard that this CM will ask Asante Eugene Garnet, LCSW to contact her son today.  Provided Nadara Mustard with contact information for [employer] as well as [employer]- Behavioral Response Team.  Nadara Mustard provided transportation for her today

## 2021-02-16 NOTE — Progress Notes (Signed)
Subjective:  Patient ID: Erin Riley, female    DOB: 1950/06/23  Age: 70 y.o. MRN: [id number]  CC: Hypertension   HPI RAELYNNE FADEL is a 70 y.o. 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 Nona Dell (her son's father) accompanies her to this visit as he states the patient's son is unable to take time off work to bring her.  Interval History: Her family is interested in placing her in a facility as she is currently homeless.  She was previously living with her son but has had behavioral disturbances which has affected her son's family and he has had to put her in the motel.  Her blood pressure is significantly elevated and she has been nonadherent with her medications.  No one is able to make the patient take her medication and Mr Berdine Addison states patient needs to be in a controlled environment where she can be monitored. The patient is interruptive throughout this whole encounter and goes off in tangents speaking about unrelated issues. Informant is unsure if she is under the care of psychiatry. Past Medical History:  Diagnosis Date   Chronic back pain    Chronic kidney disease (CKD), stage II (mild)    Class I-II   Coronary artery disease 04/2009   50% stenosis in the perforator of LAD; catheterization was for an abnormal Myoview in January 2000 showing anterior and inferolateral ischemia.   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 > 3 subsequent Echos show improved/normal EF with moderate to severe concentric LVH and diastolic dysfunction with LVOT/intracavitary gradient --> 06/2016: Severe LVH.  Vigorous EF, 65-70%.?? Gr 1 DD. Mild AS.   Hyperlipidemia    Hypertension    Hypertensive hypertrophic cardiomyopathy: NYHA class II:  Echo: Severe  concentric LVH with LV OT gradient; essentially preserved EF with diastolic dysfunction [id number]   Echo 06/2016: Severe Concentric LVH. Vigorous EF 65-70%. ~ Gr I DD.    Mild aortic stenosis by prior echocardiography    Echo 06/2016: Mild AS (Mean Gradient 15 mmHg); has had prior Mod-Severe MR (not seen on current echo)   PAD (peripheral artery disease) ([place]) March 2013   Lower extremity Dopplers: R. SFA 50-60%, R. PTA proximally occluded with distal reconstitution;; L. common iliac ~50%, L. SFA 50-70% stenosis, L. PTA < 50%   Schizophrenia ([place])     Past Surgical History:  Procedure Laterality Date   BUNIONECTOMY     carotid doppler  05/29/2011   left bulb/prox ICA moderate amtfibrous plaque with no evidence significant reduction.,right bulb /proximal ICA normal patency   lower extremity doppler  05/29/2011   right SFA 50% to 59% diameter reduction,right posterior tibal atreery occlusive disease,reconstituting distally, left common illiac<50%,left SFA 50 to70%,left post. tibial <50%   NM MYOCAR PERF WALL MOTION  03/2009   Persantine; EF 51%-both anterior and inferolateral ischemia   TRANSTHORACIC ECHOCARDIOGRAM  06/2016   Severe LVH.  Vigorous EF of 65-70%.  No RWMA. ~Only grade 1 diastolic dysfunction.  Mild aortic stenosis (mean gradient 15 mmHg)   TRANSTHORACIC ECHOCARDIOGRAM  07/2012   EF 50-55%; severe concentric LVH; only grade 1 diastolic dysfunction. Mild aortic sclerosis - with LVOT /intracavitary gradient of roughly 20 mmHg mean. Mild to moderately dilated LA;; previously reported MR not seen  Family History  Problem Relation Age of Onset   Hypertension Mother    Breast cancer Neg Hx     No Known Allergies  Outpatient Medications Prior to Visit  Medication Sig Dispense Refill   amLODipine (NORVASC) 10 MG tablet Take 1 tablet (10 mg total) by mouth daily. 90 tablet 1   atorvastatin (LIPITOR) 40 MG tablet Take 1 tablet (40 mg total) by mouth daily. 90 tablet 1    carvedilol (COREG) 25 MG tablet Take 1 tablet (25 mg total) by mouth 2 (two) times daily with a meal. 60 tablet 0   ferrous gluconate (FERGON) 324 MG tablet Take 1 tablet (324 mg total) by mouth daily with breakfast. 30 tablet 0   furosemide (LASIX) 20 MG tablet Take 1 tablet (20 mg total) by mouth daily. 90 tablet 1   hydrALAZINE (APRESOLINE) 100 MG tablet TAKE 1 TABLET (100 MG TOTAL) BY MOUTH TWO TIMES DAILY. 180 tablet 1   isosorbide mononitrate (IMDUR) 60 MG 24 hr tablet TAKE 1 TABLET (60 MG TOTAL) BY MOUTH DAILY. 90 tablet 1   OLANZapine zydis (ZYPREXA) 10 MG disintegrating tablet Take 1 tablet (10 mg total) by mouth in the morning. 30 tablet 0   OLANZapine zydis (ZYPREXA) 20 MG disintegrating tablet Take 1 tablet (20 mg total) by mouth at bedtime. 30 tablet 0   spironolactone (ALDACTONE) 25 MG tablet TAKE 1/2 TABLET (12.5 MG TOTAL) BY MOUTH DAILY. 45 tablet 1   aspirin EC 81 MG EC tablet Take 1 tablet (81 mg total) by mouth daily. Swallow whole. (Patient not taking: Reported on 09/10/2020) 30 tablet 0   No facility-administered medications prior to visit.     ROS Review of Systems  Constitutional:  Negative for activity change, appetite change and fatigue.  HENT:  Negative for congestion, sinus pressure and sore throat.   Eyes:  Negative for visual disturbance.  Respiratory:  Negative for cough, chest tightness, shortness of breath and wheezing.   Cardiovascular:  Negative for chest pain and palpitations.  Gastrointestinal:  Negative for abdominal distention, abdominal pain and constipation.  Endocrine: Negative for polydipsia.  Genitourinary:  Negative for dysuria and frequency.  Musculoskeletal:  Negative for arthralgias and back pain.  Skin:  Negative for rash.  Neurological:  Negative for tremors, light-headedness and numbness.  Hematological:  Does not bruise/bleed easily.  Psychiatric/Behavioral:  Positive for behavioral problems. Negative for agitation. The patient is  hyperactive.    Objective:  BP (!) 195/116    Pulse 99    Ht 5\' 7"  (1.702 m)    Wt 231 lb 3.2 oz (104.9 kg)    LMP 05/10/2013    SpO2 98%    BMI 36.21 kg/m   BP/Weight 02/16/2021 02/15/2021 [id number]  Systolic BP 0000000 0000000 [id number]  Diastolic BP 99991111 [id number]  Wt. (Lbs) 231.2 - 226  BMI 36.21 - 35.4  Some encounter information is confidential and restricted. Go to Review Flowsheets activity to see all data.      Physical Exam Constitutional:      Appearance: She is well-developed.  Cardiovascular:     Rate and Rhythm: Normal rate.     Heart sounds: Murmur heard.  Pulmonary:     Effort: Pulmonary effort is normal.     Breath sounds: Normal breath sounds. No wheezing or rales.  Chest:     Chest wall: No tenderness.  Abdominal:     General: Bowel sounds are normal. There is no distension.     Palpations: Abdomen  is soft. There is no mass.     Tenderness: There is no abdominal tenderness.  Musculoskeletal:        General: Normal range of motion.     Right lower leg: No edema.     Left lower leg: No edema.  Neurological:     Mental Status: She is alert and oriented to person, place, and time.  Psychiatric:        Attention and Perception: She is inattentive.        Speech: Speech is tangential.        Behavior: Behavior is aggressive.        Thought Content: Thought content is paranoid.    CMP Latest Ref Rng & Units 02/15/2021 09/10/2020 07/12/2020  Glucose 70 - 99 mg/dL 465(K) 354(S) 568(L)  BUN 8 - 27 mg/dL 16 20 27(N)  Creatinine 0.57 - 1.00 mg/dL 1.70(Y) 1.74(B) 4.49(Q)  Sodium 134 - 144 mmol/L 140 140 139  Potassium 3.5 - 5.2 mmol/L 4.4 3.9 3.4(L)  Chloride 96 - 106 mmol/L 104 107 104  CO2 20 - 29 mmol/L 24 26 27   Calcium 8.7 - 10.3 mg/dL 9.5 9.5 )  Total Protein 6.0 - 8.5 g/dL 7.3 7.7 -  Total Bilirubin 0.0 - 1.2 mg/dL 0.8 0.9 -  Alkaline Phos 44 - 121 IU/L 98 78 -  AST 0 - 40 IU/L 13 19 -  ALT 0 - 32 IU/L 10 14 -    Lipid Panel     Component Value Date/Time    CHOL 158 02/15/2021 1048   TRIG 103 02/15/2021 1048   HDL 38 (L) 02/15/2021 1048   CHOLHDL 4.2 02/15/2021 1048   CHOLHDL 3.7 07/11/2020 1710   VLDL 22 07/11/2020 1710   LDLCALC 101 (H) 02/15/2021 1048    CBC    Component Value Date/Time   WBC 7.4 02/15/2021 1048   WBC 5.7 09/10/2020 1109   RBC 3.64 (L) 02/15/2021 1048   RBC 4.33 09/10/2020 1109   HGB 10.4 (L) 02/15/2021 1048   HCT 32.5 (L) 02/15/2021 1048   PLT 360 02/15/2021 1048   MCV 89 02/15/2021 1048   MCH 28.6 02/15/2021 1048   MCH 28.9 09/10/2020 1109   MCHC 32.0 02/15/2021 1048   MCHC 31.2 09/10/2020 1109   RDW 12.7 02/15/2021 1048   LYMPHSABS 1.8 02/15/2021 1048   MONOABS 0.5 09/10/2020 1109   EOSABS 0.3 02/15/2021 1048   BASOSABS 0.0 02/15/2021 1048    Lab Results  Component Value Date   HGBA1C 5.5 02/15/2021    Assessment & Plan:  1. Hypertensive hypertrophic cardiomyopathy, without heart failure (HCC) EF of 30 to 35% Euvolemic Risk factor modification especially blood pressure control is imperative Unfortunately due to poor judgment she is not adherent Will benefit from inpatient facility  2. Paranoid schizophrenia (HCC) Currently not on medications and she is disinhibited Will benefit from psychiatric care which would be best obtained in an inpatient facility  3. Homelessness I have had the case manager meet with the patient and her son's father to discuss available options  4. Nonadherence to medication Secondary to poor judgment and underlying psych disorder  5. Accelerated hypertension See #4 above    No orders of the defined types were placed in this encounter.   Follow-up: Return in about 3 months (around 05/17/2021).       05/19/2021, MD, FAAFP. [facility] [place], [place]   02/16/2021, 11:45 AM

## 2021-02-16 NOTE — Patient Instructions (Signed)

## 2021-02-22 ENCOUNTER — Other Ambulatory Visit: Payer: Self-pay

## 2021-03-23 ENCOUNTER — Other Ambulatory Visit: Payer: Self-pay

## 2021-03-23 ENCOUNTER — Ambulatory Visit: Payer: Medicare Other | Attending: Family Medicine | Admitting: Clinical

## 2021-03-23 DIAGNOSIS — F209 Schizophrenia, unspecified: Secondary | ICD-10-CM

## 2021-03-28 ENCOUNTER — Ambulatory Visit: Payer: Medicare Other | Admitting: Family Medicine

## 2021-03-31 NOTE — BH Specialist Note (Signed)
Integrated Behavioral Health Initial In-Person Visit  MRN: [id number] Name: Erin Riley  Number of Integrated Behavioral Health Clinician visits:: 1/6 Session Start time: 11:30am  Session End time: 12:00pm Total time: 30 minutes  Types of Service: Individual psychotherapy  Interpretor:No. Interpretor Name and Language: N/A   Warm Hand Off Completed.        Subjective: Erin Riley is a 71 y.o. female accompanied by  son Patient was referred by Hoy Register, MD for schizophrenia. Patient reports the following symptoms/concerns: Pt reports that she has been involved in behavioral health support in the past with [facility]. Pt's son reports that she experiences delusional thoughts and increased anger. Pt's son reports that it's difficult to communicate with her due to her irritability and paranoia. Pt's son reports that pt has experienced problems with her mental health since he was young.  Duration of problem: 1+year; Severity of problem: severe  Objective: Mood: Irritable and Affect: Labile Risk of harm to self or others: No plan to harm self or others  Life Context: Family and Social: Pt receives support from her son.  School/Work: Pt receives disability. Pt's son is her payee. Pt is currently living in a hotel.  Self-Care: Pt has a hx of medication management. Life Changes: Pt is currently staying in a hotel and is trying to get housing. Pt's son is trying to get pt in to [facility] program.   Patient and/or Family's Strengths/Protective Factors: Concrete supports in place (healthy food, safe environments, etc.)  Goals Addressed: Patient will: Reduce symptoms of: mood instability and stress Increase knowledge and/or ability of: self-management skills  Demonstrate ability to: Increase healthy adjustment to current life circumstances  Progress towards Goals: Ongoing  Interventions: Interventions utilized: Supportive Counseling and Psychoeducation and/or Health Education   Standardized Assessments completed: GAD-7 GAD 7 : Generalized Anxiety Score 03/23/2021  Nervous, Anxious, on Edge 0  Control/stop worrying 0  Worry too much - different things 0  Trouble relaxing 0  Restless 0  Easily annoyed or irritable 0  Afraid - awful might happen 0  Total GAD 7 Score 0   Patient and/or Family Response: Pt was irate during session and had difficulty being receptive to tx. Pt is receptive to being referred for psychiatry.   Patient Centered Plan: Patient is on the following Treatment Plan(s):  Schizophrenia  Assessment: Denies SI/HI. Pt's chart suggest hx of hallucinations. Patient currently experiencing disorganized thoughts, paranoia, and delusional thoughts. Pt was irate during session and did not understand why she was there. Pt is aware that she was involved in psychiatry and therapy in the past. Pt's son appears overwhelmed with caring for his mother.    Patient may benefit from getting reestablished with psychiatry and therapy. LCSWA provided psychoeducation on the benefits of therapy and psychiatry. LCSWA provided information on [facility] to pt's son and encouraged pt and pt's son to schedule appt for therapy and psychiatry. LCSWA provided information on the process of getting into the [facility] program and the need to get established with therapy and psychiatry.   Plan: Follow up with behavioral health clinician on : 04/25/21 Behavioral recommendations: Schedule psychiatry and therapy appt and utilize crisis resources if SI/HI arises with plan, means, and intent.  Referral(s): Integrated [employer] (In Clinic) and Psychiatrist "From scale of 1-10, how likely are you to follow plan?": 10  Commodore Bellew C Sherea Liptak, LCSW

## 2021-04-14 ENCOUNTER — Telehealth (INDEPENDENT_AMBULATORY_CARE_PROVIDER_SITE_OTHER): Payer: Self-pay | Admitting: Clinical

## 2021-04-14 DIAGNOSIS — R600 Localized edema: Secondary | ICD-10-CM | POA: Insufficient documentation

## 2021-04-14 DIAGNOSIS — Z91148 Patient's other noncompliance with medication regimen for other reason: Secondary | ICD-10-CM | POA: Insufficient documentation

## 2021-04-14 NOTE — Telephone Encounter (Signed)
I contacted [facility] to schedule appt for pt. Pt's appt scheduled for 04/25/21 at 3:00pm. Renee stated that pt has to see therapist first and then she can see the Psychiatric Nurse Practitioner.   I attempted to contact pt's son to inform him of appt. No answer, left vm.

## 2021-04-22 ENCOUNTER — Telehealth: Payer: Self-pay | Admitting: Clinical

## 2021-04-22 NOTE — Telephone Encounter (Signed)
I returned phone call to pt's son. He stated that pt was recently hospitalized and is taking Zyprexa. Reports noticing a difference in her mood. I reminded him about pt's upcoming appt with [facility] on 04/25/21. I encouraged pt's son to take pt to this appt and call back once this appt is completed.

## 2021-04-25 ENCOUNTER — Ambulatory Visit: Payer: Medicare Other | Admitting: Clinical

## 2021-04-25 ENCOUNTER — Telehealth: Payer: Self-pay | Admitting: Clinical

## 2021-04-25 NOTE — Telephone Encounter (Signed)
Pt's son called requesting information for pt's appt today with [facility]. LCSW provided pt's son with address information and appt time. Pt's son also inquired about housing and needing an FL2 for [facility] program. LCSW encouraged pt's son to have pt to attend appt and they will set her up with a psychiatric np once she has appt, we can begin coordinating services for [facility]. LCSW expressed the importance of pt needing to be stable on medication.

## 2021-05-06 ENCOUNTER — Ambulatory Visit: Payer: Medicare Other | Attending: Internal Medicine | Admitting: Internal Medicine

## 2021-05-06 ENCOUNTER — Other Ambulatory Visit: Payer: Self-pay

## 2021-05-06 ENCOUNTER — Encounter: Payer: Self-pay | Admitting: Internal Medicine

## 2021-05-06 VITALS — BP 142/80 | HR 98 | Resp 16 | Wt 218.0 lb

## 2021-05-06 DIAGNOSIS — B351 Tinea unguium: Secondary | ICD-10-CM

## 2021-05-06 DIAGNOSIS — L309 Dermatitis, unspecified: Secondary | ICD-10-CM

## 2021-05-06 DIAGNOSIS — L84 Corns and callosities: Secondary | ICD-10-CM | POA: Diagnosis not present

## 2021-05-06 MED ORDER — TRIAMCINOLONE ACETONIDE 0.1 % EX CREA: 1.0000 "application " | TOPICAL_CREAM | Freq: Two times a day (BID) | CUTANEOUS | 0 refills | Status: DC

## 2021-05-06 MED ORDER — CICLOPIROX 8 % EX SOLN: Freq: Every day | CUTANEOUS | 0 refills | Status: DC
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
## 2021-05-06 NOTE — Patient Instructions (Addendum)
I have referred you to the podiatrist for the painful callus on your left foot. ? ?Use the Penlac (Ciclopirox) on your toenails for the fungus as discussed today. ? ?

## 2021-06-27 ENCOUNTER — Other Ambulatory Visit: Payer: Self-pay

## 2021-06-27 ENCOUNTER — Encounter (HOSPITAL_COMMUNITY): Payer: Self-pay | Admitting: Emergency Medicine

## 2021-06-27 ENCOUNTER — Emergency Department (HOSPITAL_COMMUNITY)
Admission: EM | Admit: 2021-06-27 | Discharge: 2021-06-27 | Disposition: A | Discharge status: Home / Self Care | Payer: Medicare Other | Attending: Emergency Medicine | Admitting: Emergency Medicine

## 2021-06-27 ENCOUNTER — Emergency Department (HOSPITAL_COMMUNITY): Payer: Medicare Other

## 2021-06-27 DIAGNOSIS — Z79899 Other long term (current) drug therapy: Secondary | ICD-10-CM | POA: Insufficient documentation

## 2021-06-27 DIAGNOSIS — R109 Unspecified abdominal pain: Secondary | ICD-10-CM | POA: Diagnosis present

## 2021-06-27 DIAGNOSIS — I10 Essential (primary) hypertension: Secondary | ICD-10-CM | POA: Diagnosis not present

## 2021-06-27 DIAGNOSIS — R1030 Lower abdominal pain, unspecified: Secondary | ICD-10-CM | POA: Insufficient documentation

## 2021-06-27 DIAGNOSIS — I169 Hypertensive crisis, unspecified: Secondary | ICD-10-CM

## 2021-06-27 LAB — COMPREHENSIVE METABOLIC PANEL
ALT: 12 U/L (ref 0–44)
AST: 15 U/L (ref 15–41)
Albumin: 3.5 g/dL (ref 3.5–5.0)
Alkaline Phosphatase: 83 U/L (ref 38–126)
Anion gap: 5 (ref 5–15)
BUN: 21 mg/dL (ref 8–23)
CO2: 25 mmol/L (ref 22–32)
Calcium: 9.2 mg/dL (ref 8.9–10.3)
Chloride: 110 mmol/L (ref 98–111)
Creatinine, Ser: 1.56 mg/dL — ABNORMAL HIGH (ref 0.44–1.00)
GFR, Estimated: 36 mL/min — ABNORMAL LOW (ref >60)
Glucose, Bld: 114 mg/dL — ABNORMAL HIGH (ref 70–99)
Potassium: 4.7 mmol/L (ref 3.5–5.1)
Sodium: 140 mmol/L (ref 135–145)
Total Bilirubin: 1.6 mg/dL — ABNORMAL HIGH (ref 0.3–1.2)
Total Protein: 7.3 g/dL (ref 6.5–8.1)

## 2021-06-27 LAB — CBC WITH DIFFERENTIAL/PLATELET
Abs Immature Granulocytes: 0.02 10*3/uL (ref 0.00–0.07)
Basophils Absolute: 0 10*3/uL (ref 0.0–0.1)
Basophils Relative: 0 %
Eosinophils Absolute: 0.1 10*3/uL (ref 0.0–0.5)
Eosinophils Relative: 1 %
HCT: 33.2 % — ABNORMAL LOW (ref 36.0–46.0)
Hemoglobin: 10.4 g/dL — ABNORMAL LOW (ref 12.0–15.0)
Immature Granulocytes: 0 %
Lymphocytes Relative: 19 %
Lymphs Abs: 1.2 10*3/uL (ref 0.7–4.0)
MCH: 28.3 pg (ref 26.0–34.0)
MCHC: 31.3 g/dL (ref 30.0–36.0)
MCV: 90.5 fL (ref 80.0–100.0)
Monocytes Absolute: 0.4 10*3/uL (ref 0.1–1.0)
Monocytes Relative: 6 %
Neutro Abs: 4.8 10*3/uL (ref 1.7–7.7)
Neutrophils Relative %: 74 %
Platelets: 311 10*3/uL (ref 150–400)
RBC: 3.67 MIL/uL — ABNORMAL LOW (ref 3.87–5.11)
RDW: 17.1 % — ABNORMAL HIGH (ref 11.5–15.5)
WBC: 6.4 10*3/uL (ref 4.0–10.5)
nRBC: 0.3 % — ABNORMAL HIGH (ref 0.0–0.2)

## 2021-06-27 LAB — URINALYSIS, ROUTINE W REFLEX MICROSCOPIC
Bilirubin Urine: NEGATIVE
Glucose, UA: NEGATIVE mg/dL
Hgb urine dipstick: NEGATIVE
Ketones, ur: NEGATIVE mg/dL
Leukocytes,Ua: NEGATIVE
Nitrite: NEGATIVE
Protein, ur: >=300 mg/dL — AB
Specific Gravity, Urine: 1.024 (ref 1.005–1.030)
pH: 5 (ref 5.0–8.0)

## 2021-06-27 MED ORDER — CARVEDILOL 12.5 MG PO TABS: 25.0000 mg | ORAL_TABLET | Freq: Once | ORAL | Status: DC

## 2021-06-27 MED ORDER — AMLODIPINE BESYLATE 5 MG PO TABS
10.0000 mg | ORAL_TABLET | Freq: Once | ORAL | Status: AC
  Administered 2021-06-27: 10 mg via ORAL
  Filled 2021-06-27: qty 2

## 2021-06-27 MED ORDER — IOHEXOL 300 MG/ML  SOLN
100.0000 mL | Freq: Once | INTRAMUSCULAR | Status: AC | PRN
  Administered 2021-06-27: 100 mL via INTRAVENOUS

## 2021-06-27 MED ORDER — AMLODIPINE BESYLATE 10 MG PO TABS: 10.0000 mg | ORAL_TABLET | Freq: Every day | ORAL | 1 refills | Status: DC

## 2021-06-27 MED ORDER — HYDRALAZINE HCL 25 MG PO TABS: 100.0000 mg | ORAL_TABLET | Freq: Once | ORAL | Status: DC

## 2021-06-27 MED ORDER — CARVEDILOL 25 MG PO TABS
25.0000 mg | ORAL_TABLET | Freq: Two times a day (BID) | ORAL | 0 refills | Status: DC
Start: 2021-06-27 — End: 2022-03-28

## 2021-06-27 MED ORDER — HYDRALAZINE HCL 100 MG PO TABS: ORAL_TABLET | ORAL | 1 refills | Status: DC

## 2021-06-27 NOTE — ED Provider Notes (Signed)
?  Physical Exam  ?BP (!) 196/128   Pulse (!) 105   Temp 97.6 ?F (36.4 ?C)   Resp 18   Ht 5\' 7"  (1.702 m)   Wt 97.5 kg   LMP 05/10/2013   SpO2 91%   BMI 33.67 kg/m?  ? ?Physical Exam ? ?Procedures  ?Procedures ? ?ED Course / MDM  ?  ?Medical Decision Making ?Care assumed at 3 pm. Patient here with hypertension and vague abdominal pain. Patient is out of norvasc.  Signed out pending urinalysis and likely discharge ? ?4:23 PM ?Patient's CT unremarkable.  UA unremarkable.  We will refill her Norvasc.  We will have her follow-up with PCP.  Stable for discharge. ? ? ?Amount and/or Complexity of Data Reviewed ?Labs: ordered. Decision-making details documented in ED Course. ?Radiology: ordered and independent interpretation performed. Decision-making details documented in ED Course. ? ?Risk ?Prescription drug management. ? ? ? ? ? ? ? ?  ?07/10/2013, MD ?06/27/21 1623 ? ?
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
## 2021-06-27 NOTE — Discharge Instructions (Addendum)
Take your blood pressure medicines as prescribed. ? ?Please see your doctor for follow-up. ? ?If you have worsening hallucinations please go to behavioral health urgent care. ? ?See your doctor for follow-up ? ?Return to ER if you have worse abdominal pain, thoughts of harming yourself or others ?
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
## 2021-06-27 NOTE — ED Notes (Signed)
Patient states her ride is here and does not have time to wait for her medication. Discharge instructions and prescriptions reviewed with patient.  ?

## 2021-07-21 ENCOUNTER — Other Ambulatory Visit: Payer: Self-pay

## 2021-07-21 ENCOUNTER — Emergency Department (HOSPITAL_COMMUNITY)
Admission: EM | Admit: 2021-07-21 | Discharge: 2021-07-21 | Disposition: A | Discharge status: Home / Self Care | Payer: Medicare Other | Attending: Emergency Medicine | Admitting: Emergency Medicine

## 2021-07-21 DIAGNOSIS — Z7982 Long term (current) use of aspirin: Secondary | ICD-10-CM | POA: Insufficient documentation

## 2021-07-21 DIAGNOSIS — N182 Chronic kidney disease, stage 2 (mild): Secondary | ICD-10-CM | POA: Diagnosis not present

## 2021-07-21 DIAGNOSIS — I2510 Atherosclerotic heart disease of native coronary artery without angina pectoris: Secondary | ICD-10-CM | POA: Insufficient documentation

## 2021-07-21 DIAGNOSIS — I129 Hypertensive chronic kidney disease with stage 1 through stage 4 chronic kidney disease, or unspecified chronic kidney disease: Secondary | ICD-10-CM | POA: Insufficient documentation

## 2021-07-21 DIAGNOSIS — R1030 Lower abdominal pain, unspecified: Secondary | ICD-10-CM | POA: Diagnosis present

## 2021-07-21 DIAGNOSIS — R7989 Other specified abnormal findings of blood chemistry: Secondary | ICD-10-CM | POA: Diagnosis not present

## 2021-07-21 DIAGNOSIS — Z79899 Other long term (current) drug therapy: Secondary | ICD-10-CM | POA: Diagnosis not present

## 2021-07-21 LAB — COMPREHENSIVE METABOLIC PANEL
ALT: 13 U/L (ref 0–44)
AST: 18 U/L (ref 15–41)
Albumin: 3.8 g/dL (ref 3.5–5.0)
Alkaline Phosphatase: 92 U/L (ref 38–126)
Anion gap: 8 (ref 5–15)
BUN: 24 mg/dL — ABNORMAL HIGH (ref 8–23)
CO2: 25 mmol/L (ref 22–32)
Calcium: 9.6 mg/dL (ref 8.9–10.3)
Chloride: 103 mmol/L (ref 98–111)
Creatinine, Ser: 1.2 mg/dL — ABNORMAL HIGH (ref 0.44–1.00)
GFR, Estimated: 49 mL/min — ABNORMAL LOW (ref >60)
Glucose, Bld: 109 mg/dL — ABNORMAL HIGH (ref 70–99)
Potassium: 4.2 mmol/L (ref 3.5–5.1)
Sodium: 136 mmol/L (ref 135–145)
Total Bilirubin: 0.8 mg/dL (ref 0.3–1.2)
Total Protein: 8 g/dL (ref 6.5–8.1)

## 2021-07-21 LAB — CBC WITH DIFFERENTIAL/PLATELET
Abs Immature Granulocytes: 0.01 10*3/uL (ref 0.00–0.07)
Basophils Absolute: 0 10*3/uL (ref 0.0–0.1)
Basophils Relative: 1 %
Eosinophils Absolute: 0.1 10*3/uL (ref 0.0–0.5)
Eosinophils Relative: 3 %
HCT: 44.5 % (ref 36.0–46.0)
Hemoglobin: 13.8 g/dL (ref 12.0–15.0)
Immature Granulocytes: 0 %
Lymphocytes Relative: 34 %
Lymphs Abs: 1.3 10*3/uL (ref 0.7–4.0)
MCH: 28.5 pg (ref 26.0–34.0)
MCHC: 31 g/dL (ref 30.0–36.0)
MCV: 91.9 fL (ref 80.0–100.0)
Monocytes Absolute: 0.3 10*3/uL (ref 0.1–1.0)
Monocytes Relative: 9 %
Neutro Abs: 2 10*3/uL (ref 1.7–7.7)
Neutrophils Relative %: 53 %
Platelets: 269 10*3/uL (ref 150–400)
RBC: 4.84 MIL/uL (ref 3.87–5.11)
RDW: 16.4 % — ABNORMAL HIGH (ref 11.5–15.5)
WBC: 3.8 10*3/uL — ABNORMAL LOW (ref 4.0–10.5)
nRBC: 0 % (ref 0.0–0.2)

## 2021-07-21 LAB — URINALYSIS, ROUTINE W REFLEX MICROSCOPIC
Bacteria, UA: NONE SEEN
Bilirubin Urine: NEGATIVE
Glucose, UA: NEGATIVE mg/dL
Hgb urine dipstick: NEGATIVE
Ketones, ur: NEGATIVE mg/dL
Leukocytes,Ua: NEGATIVE
Nitrite: NEGATIVE
Protein, ur: 30 mg/dL — AB
Specific Gravity, Urine: 1.018 (ref 1.005–1.030)
pH: 6 (ref 5.0–8.0)

## 2021-07-21 LAB — LIPASE, BLOOD: Lipase: 39 U/L (ref 11–51)
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
## 2021-07-21 NOTE — Discharge Instructions (Signed)
You were seen today for abdominal pain.  Work-up was reassuring for no urinary tract infection, pancreatitis, gallbladder issues, appendicitis.  Drink fluids as you are able.  If you notice blood in your stool I recommend reevaluation.

## 2021-07-21 NOTE — ED Triage Notes (Signed)
Pt BIB EMS due to lower abd pain. Pt states she is 7 months pregnant and has not taken her BP medication. Pt is usually axox4. VSS.

## 2021-07-21 NOTE — ED Notes (Signed)
RN called [facility].  

## 2021-07-21 NOTE — ED Notes (Signed)
Got patient undressed on the monitor did ekg shown to Dr Ray patient is resting with call bell in reach 

## 2021-07-21 NOTE — ED Notes (Signed)
Placed patient on the bedpan patient is resting with call bell in reach

## 2021-07-21 NOTE — ED Provider Notes (Signed)
[facility] EMERGENCY DEPARTMENT Provider Note   CSN: [id number] Arrival date & time: 07/21/21  1138     History  Chief Complaint  Patient presents with   Abdominal Pain    Erin Riley is a 71 y.o. female.  Patient presents to the hospital via EMS complaining of lower abdominal pain.  Patient states when asked that her pain has been going on for months.  Patient apparently told EMS that she did not take her blood pressure medication but told me that she had been taking her blood pressure medication as prescribed.  She also apparently told EMS that she was 7 months pregnant.  Patient denies chest pain, shortness of breath, headache, nausea, vomiting, diarrhea, constipation.  Patient also denies vaginal discharge and dysuria.  Patient endorses lower abdominal pain.  Patient does have history of schizophrenia but is currently denying any sort of hallucination.  Patient was evaluated for lower abdominal pain in late April of this year with similar complaints.  Past medical history significant for hypertension, peripheral artery disease, CKD stage II, schizophrenia, CAD, hyperlipidemia, diverticulitis, hypertensive hypertrophic cardiomyopathy.  Last menstrual period was in 2015.  HPI     Home Medications Prior to Admission medications   Medication Sig Start Date End Date Taking? Authorizing Provider  amLODipine (NORVASC) 10 MG tablet Take 1 tablet (10 mg total) by mouth daily. 06/27/21  Yes Drenda Freeze, MD  aspirin EC 81 MG EC tablet Take 1 tablet (81 mg total) by mouth daily. Swallow whole. Patient not taking: Reported on 09/10/2020 05/21/20   Jeralyn Bennett, MD  atorvastatin (LIPITOR) 40 MG tablet Take 1 tablet (40 mg total) by mouth daily. Patient not taking: Reported on 07/21/2021 02/15/21   Charlott Rakes, MD  carvedilol (COREG) 25 MG tablet Take 1 tablet (25 mg total) by mouth 2 (two) times daily with a meal. 06/27/21   Drenda Freeze, MD  ciclopirox [facility])  8 % solution Apply topically at bedtime. Apply over toenails. Apply daily over previous coat. After seven (7) days, remove with rubbing alcohol and repeat the cycle. 05/06/21   Ladell Pier, MD  ferrous gluconate (FERGON) 324 MG tablet Take 1 tablet (324 mg total) by mouth daily with breakfast. Patient not taking: Reported on 07/21/2021 05/21/20   Jeralyn Bennett, MD  furosemide (LASIX) 20 MG tablet Take 1 tablet (20 mg total) by mouth daily. Patient not taking: Reported on 07/21/2021 02/15/21   Charlott Rakes, MD  hydrALAZINE (APRESOLINE) 100 MG tablet TAKE 1 TABLET (100 MG TOTAL) BY MOUTH TWO TIMES DAILY. Patient taking differently: Take 100 mg by mouth 2 (two) times daily. 06/27/21 06/27/22  Drenda Freeze, MD  isosorbide mononitrate (IMDUR) 60 MG 24 hr tablet TAKE 1 TABLET (60 MG TOTAL) BY MOUTH DAILY. Patient not taking: Reported on 07/21/2021 02/15/21 02/15/22  Charlott Rakes, MD  OLANZapine zydis (ZYPREXA) 10 MG disintegrating tablet Take 1 tablet (10 mg total) by mouth in the morning. Patient not taking: Reported on 07/21/2021 05/20/20   Jeralyn Bennett, MD  OLANZapine zydis (ZYPREXA) 20 MG disintegrating tablet Take 1 tablet (20 mg total) by mouth at bedtime. Patient not taking: Reported on 07/21/2021 05/20/20   Jeralyn Bennett, MD  spironolactone (ALDACTONE) 25 MG tablet TAKE 1/2 TABLET (12.5 MG TOTAL) BY MOUTH DAILY. Patient not taking: Reported on 07/21/2021 02/15/21 02/15/22  Charlott Rakes, MD  triamcinolone cream (KENALOG) 0.1 % Apply 1 application topically 2 (two) times daily. Apply to rash on left thigh 05/06/21  Ladell Pier, MD      Allergies    Patient has no known allergies.    Review of Systems   Review of Systems  Constitutional:  Negative for diaphoresis and fever.  Respiratory:  Negative for shortness of breath.   Cardiovascular:  Negative for chest pain.  Gastrointestinal:  Positive for abdominal pain. Negative for blood in stool, constipation, diarrhea,  nausea and vomiting.  Genitourinary:  Negative for dysuria, vaginal bleeding and vaginal discharge.  Psychiatric/Behavioral:  Negative for confusion and hallucinations.    Physical Exam Updated Vital Signs BP (!) 163/103   Pulse (!) 55   Temp (!) 97.3 F (36.3 C) (Oral)   Resp 17   LMP 05/10/2013   SpO2 98%  Physical Exam Vitals and nursing note reviewed.  Constitutional:      General: She is not in acute distress.    Appearance: She is not ill-appearing.  HENT:     Head: Normocephalic and atraumatic.  Eyes:     Extraocular Movements: Extraocular movements intact.  Cardiovascular:     Rate and Rhythm: Normal rate and regular rhythm.  Pulmonary:     Effort: Pulmonary effort is normal.     Breath sounds: Normal breath sounds.  Abdominal:     General: Abdomen is flat. Bowel sounds are normal. There is no distension.     Palpations: Abdomen is soft.     Tenderness: There is no abdominal tenderness.  Genitourinary:    Comments: Deferred Skin:    General: Skin is warm and dry.  Neurological:     Mental Status: She is alert.    ED Results / Procedures / Treatments   Labs (all labs ordered are listed, but only abnormal results are displayed) Labs Reviewed  CBC WITH DIFFERENTIAL/PLATELET - Abnormal; Notable for the following components:      Result Value   WBC 3.8 (*)    RDW 16.4 (*)    All other components within normal limits  COMPREHENSIVE METABOLIC PANEL - Abnormal; Notable for the following components:   Glucose, Bld 109 (*)    BUN 24 (*)    Creatinine, Ser 1.20 (*)    GFR, Estimated 49 (*)    All other components within normal limits  URINALYSIS, ROUTINE W REFLEX MICROSCOPIC - Abnormal; Notable for the following components:   Protein, ur 30 (*)    All other components within normal limits  LIPASE, BLOOD    EKG EKG Interpretation  Date/Time:  Thursday Jul 21 2021 11:42:06 EDT Ventricular Rate:  69 PR Interval:  185 QRS Duration: 108 QT  Interval:  429 QTC Calculation: 460 R Axis:   -8 Text Interpretation: Sinus or ectopic atrial rhythm LVH with secondary repolarization abnormality Non-specific ST-t changes new twave inversion v4-v6 Confirmed by Pattricia Boss [phone]) on 07/21/2021 11:49:30 AM  Radiology No results found.  Procedures Procedures    Medications Ordered in ED Medications - No data to display  ED Course/ Medical Decision Making/ A&P                           Medical Decision Making Amount and/or Complexity of Data Reviewed Labs: ordered.   This patient presents to the ED for concern of lower abdominal pain, this involves an extensive number of treatment options, and is a complaint that carries with it a high risk of complications and morbidity.  The differential diagnosis includes diverticulitis, nephrolithiasis, UTI, colitis, IBD, PID, and others  Co morbidities that complicate the patient evaluation  History of schizophrenia, history of low abdominal pain   Additional history obtained:  Additional history obtained from EMS External records from outside source obtained and reviewed including emergency room charts from April of this year   Lab Tests:  I Ordered, and personally interpreted labs.  The pertinent results include: Creatinine 1.2, improved from previous; lipase 39;    Imaging Studies ordered:  CT abdomen from April of this year ordered due to similar complaints showed no acute abdominal or pelvic pathology  Cardiac Monitoring: / EKG:  The patient was maintained on a cardiac monitor.  I personally viewed and interpreted the cardiac monitored which showed an underlying rhythm of: Sinus rhythm  Test / Admission - Considered:  After discussion of the work-up with the patient, she states that she feels much better.  The patient had no focal tenderness, no generalized tenderness on exam.  Cholecystitis, pancreatitis, appendicitis unlikely.  No indication for new imaging at this  time.  She has no nausea, vomiting, diarrhea, constipation.  Unclear etiology of her abdominal pain, recommend follow-up with her primary care provider  Final Clinical Impression(s) / ED Diagnoses Final diagnoses:  Lower abdominal pain    Rx / DC Orders ED Discharge Orders     None         Ronny Bacon 07/21/21 1405    Pattricia Boss, MD 07/21/21 1655

## 2021-08-08 ENCOUNTER — Encounter (HOSPITAL_COMMUNITY): Payer: Self-pay

## 2021-08-08 ENCOUNTER — Emergency Department (HOSPITAL_COMMUNITY)
Admission: EM | Admit: 2021-08-08 | Discharge: 2021-08-08 | Disposition: A | Discharge status: Home / Self Care | Payer: Medicare Other | Attending: Emergency Medicine | Admitting: Emergency Medicine

## 2021-08-08 ENCOUNTER — Other Ambulatory Visit: Payer: Self-pay

## 2021-08-08 DIAGNOSIS — E785 Hyperlipidemia, unspecified: Secondary | ICD-10-CM

## 2021-08-08 DIAGNOSIS — Z79899 Other long term (current) drug therapy: Secondary | ICD-10-CM | POA: Diagnosis not present

## 2021-08-08 DIAGNOSIS — I5189 Other ill-defined heart diseases: Secondary | ICD-10-CM

## 2021-08-08 DIAGNOSIS — R300 Dysuria: Secondary | ICD-10-CM | POA: Diagnosis not present

## 2021-08-08 DIAGNOSIS — R4789 Other speech disturbances: Secondary | ICD-10-CM | POA: Diagnosis present

## 2021-08-08 DIAGNOSIS — I169 Hypertensive crisis, unspecified: Secondary | ICD-10-CM

## 2021-08-08 LAB — URINALYSIS, ROUTINE W REFLEX MICROSCOPIC
Bilirubin Urine: NEGATIVE
Glucose, UA: NEGATIVE mg/dL
Hgb urine dipstick: NEGATIVE
Ketones, ur: NEGATIVE mg/dL
Leukocytes,Ua: NEGATIVE
Nitrite: NEGATIVE
Protein, ur: NEGATIVE mg/dL
Specific Gravity, Urine: 1.003 — ABNORMAL LOW (ref 1.005–1.030)
pH: 7 (ref 5.0–8.0)

## 2021-08-08 NOTE — ED Provider Notes (Signed)
[facility]-EMERGENCY DEPT Provider Note   CSN: [id number] Arrival date & time: 08/08/21  1130     History  Chief Complaint  Patient presents with   Medical Clearance    Erin Riley is a 71 y.o. female.  Patient here with concerns for urinary tract infection.  History of schizophrenia.  States recent urinary tract infection.  She denies any suicidal homicidal ideation.  She lives in an apartment complex where they called EMS for some bizarre speech.  Patient continues to bother staff per EMS about how she is being poisoned and that she is 8 months pregnant.  Overall she denies any hallucinations.  States she lives by herself.  She takes her medications.  Denies any chest pain or shortness of breath or abdominal pain.  No fevers no chills.  The history is provided by the patient.      Home Medications Prior to Admission medications   Medication Sig Start Date End Date Taking? Authorizing Provider  amLODipine (NORVASC) 10 MG tablet Take 1 tablet (10 mg total) by mouth daily. 06/27/21   Charlynne Pander, MD  aspirin EC 81 MG EC tablet Take 1 tablet (81 mg total) by mouth daily. Swallow whole. Patient not taking: Reported on 09/10/2020 05/21/20   Glenford Bayley, MD  atorvastatin (LIPITOR) 40 MG tablet Take 1 tablet (40 mg total) by mouth daily. Patient not taking: Reported on 07/21/2021 02/15/21   Hoy Register, MD  carvedilol (COREG) 25 MG tablet Take 1 tablet (25 mg total) by mouth 2 (two) times daily with a meal. 06/27/21   Charlynne Pander, MD  ciclopirox [facility]) 8 % solution Apply topically at bedtime. Apply over toenails. Apply daily over previous coat. After seven (7) days, remove with rubbing alcohol and repeat the cycle. 05/06/21   Marcine Matar, MD  ferrous gluconate (FERGON) 324 MG tablet Take 1 tablet (324 mg total) by mouth daily with breakfast. Patient not taking: Reported on 07/21/2021 05/21/20   Glenford Bayley, MD  furosemide (LASIX) 20 MG  tablet Take 1 tablet (20 mg total) by mouth daily. Patient not taking: Reported on 07/21/2021 02/15/21   Hoy Register, MD  hydrALAZINE (APRESOLINE) 100 MG tablet TAKE 1 TABLET (100 MG TOTAL) BY MOUTH TWO TIMES DAILY. Patient taking differently: Take 100 mg by mouth 2 (two) times daily. 06/27/21 06/27/22  Charlynne Pander, MD  isosorbide mononitrate (IMDUR) 60 MG 24 hr tablet TAKE 1 TABLET (60 MG TOTAL) BY MOUTH DAILY. Patient not taking: Reported on 07/21/2021 02/15/21 02/15/22  Hoy Register, MD  OLANZapine zydis (ZYPREXA) 10 MG disintegrating tablet Take 1 tablet (10 mg total) by mouth in the morning. Patient not taking: Reported on 07/21/2021 05/20/20   Glenford Bayley, MD  OLANZapine zydis (ZYPREXA) 20 MG disintegrating tablet Take 1 tablet (20 mg total) by mouth at bedtime. Patient not taking: Reported on 07/21/2021 05/20/20   Glenford Bayley, MD  spironolactone (ALDACTONE) 25 MG tablet TAKE 1/2 TABLET (12.5 MG TOTAL) BY MOUTH DAILY. Patient not taking: Reported on 07/21/2021 02/15/21 02/15/22  Hoy Register, MD  triamcinolone cream (KENALOG) 0.1 % Apply 1 application topically 2 (two) times daily. Apply to rash on left thigh 05/06/21   Marcine Matar, MD      Allergies    Patient has no known allergies.    Review of Systems   Review of Systems  Physical Exam Updated Vital Signs BP (!) 171/103 (BP Location: Left Arm)   Pulse 66   Temp 98.2  F (36.8 C) (Oral)   Resp 18   Ht 5\' 7"  (1.702 m)   Wt 81.6 kg   LMP 05/10/2013   SpO2 98%   BMI 28.19 kg/m  Physical Exam Vitals and nursing note reviewed.  Constitutional:      General: She is not in acute distress.    Appearance: She is well-developed.  HENT:     Head: Normocephalic and atraumatic.     Nose: Nose normal.     Mouth/Throat:     Mouth: Mucous membranes are moist.  Eyes:     Extraocular Movements: Extraocular movements intact.     Conjunctiva/sclera: Conjunctivae normal.     Pupils: Pupils are equal, round,  and reactive to light.  Cardiovascular:     Rate and Rhythm: Normal rate and regular rhythm.     Pulses: Normal pulses.     Heart sounds: No murmur heard. Pulmonary:     Effort: Pulmonary effort is normal. No respiratory distress.     Breath sounds: Normal breath sounds.  Abdominal:     Palpations: Abdomen is soft.     Tenderness: There is no abdominal tenderness.  Musculoskeletal:        General: No swelling.     Cervical back: Neck supple.  Skin:    General: Skin is warm and dry.     Capillary Refill: Capillary refill takes less than 2 seconds.  Neurological:     General: No focal deficit present.     Mental Status: She is alert.  Psychiatric:        Mood and Affect: Mood normal.     Comments: Patient denies SI or HI, she does have some paranoid thinking but she is not aggressive, she is calm, she does not appear to be hallucinating, she has appropriate affect    ED Results / Procedures / Treatments   Labs (all labs ordered are listed, but only abnormal results are displayed) Labs Reviewed  URINALYSIS, ROUTINE W REFLEX MICROSCOPIC - Abnormal; Notable for the following components:      Result Value   Color, Urine STRAW (*)    Specific Gravity, Urine 1.003 (*)    All other components within normal limits    EKG None  Radiology No results found.  Procedures Procedures    Medications Ordered in ED Medications - No data to display  ED Course/ Medical Decision Making/ A&P                           Medical Decision Making Amount and/or Complexity of Data Reviewed Labs: ordered.   Erin Riley is here with concern for UTI.  History of schizophrenia.  Unremarkable vitals.  No fever.  No signs of distress.  EMS was called by people at the apartment complex staff that she lives because she continues to talk about people poisoning her food and being 8 months pregnant.  She lives by herself.  Patient is calm and cooperative.  She denies any suicidal homicidal ideation.   She does have some delusions/paranoid speech.  But she is able to answer questions appropriately.  She is concerned about having a urinary tract infection.  She denies any abdominal pain, chest pain, shortness of breath.  Overall she appears well.  Urinalysis is negative for infection.  She does have some delusions but she does not appear to be manic.  We will have her follow-up with psychiatry.  Discharge.  This chart was dictated using  voice recognition software.  Despite best efforts to proofread,  errors can occur which can change the documentation meaning.         Final Clinical Impression(s) / ED Diagnoses Final diagnoses:  Dysuria    Rx / DC Orders ED Discharge Orders     None         Virgina Norfolk, DO 08/08/21 1319

## 2021-08-08 NOTE — ED Triage Notes (Addendum)
Per EMS- The apartment complex where the patient lives called EMS because she kept calling them stating that they (apartment complex staff) were putting poison in her food. [name] also told EMS that she was a [employer] and she was 8 months pregnant. Patient lives alone.  Patient also reported that she was diagnosed with an UTI and was not receiving any antibiotics.  Patient has a history of schizophrenia.

## 2021-08-08 NOTE — Discharge Instructions (Addendum)
Follow up with behavioral health °

## 2021-08-08 NOTE — ED Notes (Signed)
Attempted to complete EKG. Pt reports that she will not comply until she gets a room. Pt appears agitated.

## 2021-08-31 ENCOUNTER — Inpatient Hospital Stay: Payer: Medicare Other | Admitting: Physician Assistant

## 2021-08-31 NOTE — Progress Notes (Deleted)
Patient ID: Erin Riley, female   DOB: [birth year], 71 y.o.   MRN: [id number]   After ED visit 08/08/2021  From ED note: JACQUES WILLINGHAM is here with concern for UTI.  History of schizophrenia.  Unremarkable vitals.  No fever.  No signs of distress.  EMS was called by people at the apartment complex staff that she lives because she continues to talk about people poisoning her food and being 8 months pregnant.  She lives by herself.  Patient is calm and cooperative.  She denies any suicidal homicidal ideation.  She does have some delusions/paranoid speech.  But she is able to answer questions appropriately.  She is concerned about having a urinary tract infection.  She denies any abdominal pain, chest pain, shortness of breath.  Overall she appears well.  Urinalysis is negative for infection.  She does have some delusions but she does not appear to be manic

## 2021-09-17 ENCOUNTER — Emergency Department (HOSPITAL_COMMUNITY)
Admission: EM | Admit: 2021-09-17 | Discharge: 2021-09-18 | Disposition: A | Discharge status: Home / Self Care | Payer: Medicare Other | Attending: Emergency Medicine | Admitting: Emergency Medicine

## 2021-09-17 ENCOUNTER — Encounter (HOSPITAL_COMMUNITY): Payer: Self-pay

## 2021-09-17 ENCOUNTER — Other Ambulatory Visit: Payer: Self-pay

## 2021-09-17 DIAGNOSIS — F22 Delusional disorders: Secondary | ICD-10-CM

## 2021-09-17 DIAGNOSIS — R350 Frequency of micturition: Secondary | ICD-10-CM | POA: Diagnosis present

## 2021-09-17 DIAGNOSIS — N182 Chronic kidney disease, stage 2 (mild): Secondary | ICD-10-CM | POA: Insufficient documentation

## 2021-09-17 DIAGNOSIS — F17210 Nicotine dependence, cigarettes, uncomplicated: Secondary | ICD-10-CM | POA: Diagnosis not present

## 2021-09-17 DIAGNOSIS — I2510 Atherosclerotic heart disease of native coronary artery without angina pectoris: Secondary | ICD-10-CM | POA: Diagnosis not present

## 2021-09-17 DIAGNOSIS — I129 Hypertensive chronic kidney disease with stage 1 through stage 4 chronic kidney disease, or unspecified chronic kidney disease: Secondary | ICD-10-CM | POA: Diagnosis not present

## 2021-09-17 DIAGNOSIS — R410 Disorientation, unspecified: Secondary | ICD-10-CM

## 2021-09-17 LAB — URINALYSIS, ROUTINE W REFLEX MICROSCOPIC
Bacteria, UA: NONE SEEN
Bilirubin Urine: NEGATIVE
Glucose, UA: NEGATIVE mg/dL
Hgb urine dipstick: NEGATIVE
Ketones, ur: NEGATIVE mg/dL
Leukocytes,Ua: NEGATIVE
Nitrite: NEGATIVE
Protein, ur: 100 mg/dL — AB
Specific Gravity, Urine: 1.01 (ref 1.005–1.030)
pH: 7 (ref 5.0–8.0)

## 2021-09-17 NOTE — ED Triage Notes (Signed)
BIB [facility] after pulling the fire alarm at home "because she had an emergency". States she thinks she is 8 months pregnant and has UTI sx. Hx of schizophrenia. Seen for similar in the past.   States her coreg is medication for her baby and would like it refilled.

## 2021-09-17 NOTE — ED Provider Notes (Signed)
[facility] Emergency Department Provider Note MRN:  [id number]  Arrival date & time: 09/18/21     Chief Complaint   Urinary issue History of Present Illness   Erin Riley is a 71 y.o. year-old female with a history of schizophrenia, CKD, CAD presenting to the ED with chief complaint of urinary issue.  Patient explains she is having frequent urination and she wants [place] to induce labor.  She is tired of being pregnant for this long.  She states that her mother was having babies when she was 55 years old and it runs in her blood.  She also frequently talks about the purse that she bought at [employer] and how someone broke it.  Denies SI or HI.  Review of Systems  A thorough review of systems was obtained and all systems are negative except as noted in the HPI and PMH.   Patient's Health History    Past Medical History:  Diagnosis Date   Chronic back pain    Chronic kidney disease (CKD), stage II (mild)    Class I-II   Coronary artery disease 04/2009   50% stenosis in the perforator of LAD; catheterization was for an abnormal Myoview in January 2000 showing anterior and inferolateral ischemia.   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 > 3 subsequent Echos show improved/normal EF with moderate to severe concentric LVH and diastolic dysfunction with LVOT/intracavitary gradient --> 06/2016: Severe LVH.  Vigorous EF, 65-70%.?? Gr 1 DD. Mild AS.   Hyperlipidemia    Hypertension    Hypertensive hypertrophic cardiomyopathy: NYHA class II:  Echo: Severe concentric LVH with LV OT gradient; essentially preserved EF with diastolic dysfunction [id number]   Echo 06/2016: Severe Concentric LVH. Vigorous EF 65-70%. ~ Gr I DD.    Mild aortic stenosis by prior echocardiography    Echo 06/2016: Mild AS (Mean Gradient 15 mmHg); has had prior Mod-Severe MR (not seen on  current echo)   PAD (peripheral artery disease) ([place]) March 2013   Lower extremity Dopplers: R. SFA 50-60%, R. PTA proximally occluded with distal reconstitution;; L. common iliac ~50%, L. SFA 50-70% stenosis, L. PTA < 50%   Schizophrenia ([place])     Past Surgical History:  Procedure Laterality Date   BUNIONECTOMY     carotid doppler  05/29/2011   left bulb/prox ICA moderate amtfibrous plaque with no evidence significant reduction.,right bulb /proximal ICA normal patency   lower extremity doppler  05/29/2011   right SFA 50% to 59% diameter reduction,right posterior tibal atreery occlusive disease,reconstituting distally, left common illiac<50%,left SFA 50 to70%,left post. tibial <50%   NM MYOCAR PERF WALL MOTION  03/2009   Persantine; EF 51%-both anterior and inferolateral ischemia   TRANSTHORACIC ECHOCARDIOGRAM  06/2016   Severe LVH.  Vigorous EF of 65-70%.  No RWMA. ~Only grade 1 diastolic dysfunction.  Mild aortic stenosis (mean gradient 15 mmHg)   TRANSTHORACIC ECHOCARDIOGRAM  07/2012   EF 50-55%; severe concentric LVH; only grade 1 diastolic dysfunction. Mild aortic sclerosis - with LVOT /intracavitary gradient of roughly 20 mmHg mean. Mild to moderately dilated LA;; previously reported MR not seen    Family History  Problem Relation Age of Onset   Hypertension Mother    Breast cancer Neg Hx     Social History   Socioeconomic History   Marital status: Single    Spouse name: Not on file  Number of children: 7   Years of education: Not on file   Highest education level: Not on file  Occupational History   Not on file  Tobacco Use   Smoking status: Some Days    Types: Cigarettes    Last attempt to quit: 05/14/2002    Years since quitting: 19.3   Smokeless tobacco: Never  Vaping Use   Vaping Use: Never used  Substance and Sexual Activity   Alcohol use: No   Drug use: No   Sexual activity: Yes    Birth control/protection: Post-menopausal  Other Topics Concern   Not on  file  Social History Narrative   Now single mother of 2 with one grandchild. She quit smoking roughly 5 years ago and is not so since. She has also stopped drinking alcohol. She does try get routine exercise walking at least a mile 3-4 days a week.    She lives with her 24 year old mother. 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   Social Determinants of Health   Financial Resource Strain: Not on file  Food Insecurity: Not on file  Transportation Needs: Not on file  Physical Activity: Not on file  Stress: Not on file  Social Connections: Not on file  Intimate Partner Violence: Not on file     Physical Exam   Vitals:   09/17/21 2212  BP: (!) 203/123  Pulse: 87  Resp: 16  Temp: 97.9 F (36.6 C)  SpO2: 94%    CONSTITUTIONAL: Well-appearing, NAD NEURO/PSYCH:  Alert and oriented x 3, no focal deficits, tangential speech, labile mood, fixed delusions EYES:  eyes equal and reactive ENT/NECK:  no LAD, no JVD CARDIO: Regular rate, well-perfused, normal S1 and S2 PULM:  CTAB no wheezing or rhonchi GI/GU:  non-distended, non-tender MSK/SPINE:  No gross deformities, no edema SKIN:  no rash, atraumatic   *Additional and/or pertinent findings included in MDM below  Diagnostic and Interventional Summary    EKG Interpretation  Date/Time:    Ventricular Rate:    PR Interval:    QRS Duration:   QT Interval:    QTC Calculation:   R Axis:     Text Interpretation:         Labs Reviewed  URINALYSIS, ROUTINE W REFLEX MICROSCOPIC - Abnormal; Notable for the following components:      Result Value   Color, Urine STRAW (*)    Protein, ur 100 (*)    All other components within normal limits  URINE CULTURE    No orders to display    Medications - No data to display   Procedures  /  Critical Care Procedures  ED Course and Medical Decision Making  Initial Impression and Ddx Patient continues to have fixed delusions of pregnancy at the age of 71 years old.  She is in no acute  distress, hypertensive but without signs of endorgan dysfunction, normal neurological exam.  Sounds like she is not taking her Zyprexa.  I suggested the patient stay for mental health evaluation and she refused, she is leaving.  Currently without indication to keep her against her will.  Patient eloped.  Past medical/surgical history that increases complexity of ED encounter: None  Interpretation of Diagnostics Urinalysis normal.  Patient Reassessment and Ultimate Disposition/Management     Eloped  Patient management required discussion with the following services or consulting groups:  None  Complexity of Problems Addressed Acute complicated illness or Injury  Additional Data Reviewed and Analyzed Further history obtained from: None  Additional Factors  Impacting ED Encounter Risk None  Elmer Sow. Pilar Plate, MD [facility] Emergency Medicine [facility] [email or web address]  Final Clinical Impressions(s) / ED Diagnoses     ICD-10-CM   1. Delusions (HCC)  F22       ED Discharge Orders     None        Discharge Instructions Discussed with and Provided to Patient:    Discharge Instructions      You were evaluated in the Emergency Department and after careful evaluation, we did not find any emergent condition requiring admission or further testing in the hospital.  It is very important that you follow-up with behavioral health and restart your psychiatric medications.  Please return to the Emergency Department if you experience any worsening of your condition.  Thank you for allowing [place] to be a part of your care.       Sabas Sous, MD 09/18/21 [phone]

## 2021-09-17 NOTE — ED Notes (Signed)
ED Provider at bedside. 

## 2021-09-17 NOTE — Discharge Instructions (Signed)
You were evaluated in the Emergency Department and after careful evaluation, we did not find any emergent condition requiring admission or further testing in the hospital.  It is very important that you follow-up with behavioral health and restart your psychiatric medications.  Please return to the Emergency Department if you experience any worsening of your condition.  Thank you for allowing [place] to be a part of your care.

## 2021-09-18 NOTE — ED Notes (Signed)
Pt asking to stay in room until AM to see another doctor. This RN explained that she has been discharged.

## 2021-09-18 NOTE — ED Notes (Signed)
Attempted to call son, Durwin, no answer.

## 2021-09-19 LAB — URINE CULTURE: Culture: NO GROWTH

## 2021-09-20 ENCOUNTER — Telehealth: Payer: Self-pay | Admitting: Family Medicine
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
## 2021-09-21 NOTE — Telephone Encounter (Signed)
fyi

## 2021-09-22 NOTE — Telephone Encounter (Signed)
Please advise to schedule an office visit.  Thank you.

## 2021-09-22 NOTE — Telephone Encounter (Signed)
Noted. Will attempt to meet with pt when she schedules an appt with PCP

## 2021-09-29 NOTE — Telephone Encounter (Signed)
Call placed to patient and Son answered the call and he states that he will reach out to the office to schedule an appointment.

## 2021-10-06 NOTE — Telephone Encounter (Signed)
noted 

## 2021-10-07 ENCOUNTER — Emergency Department (HOSPITAL_COMMUNITY)
Admission: EM | Admit: 2021-10-07 | Discharge: 2021-10-10 | Disposition: A | Discharge status: Other Acute Inpatient Hospital | Payer: Medicare Other | Attending: Emergency Medicine | Admitting: Emergency Medicine

## 2021-10-07 ENCOUNTER — Emergency Department (HOSPITAL_COMMUNITY): Payer: Medicare Other

## 2021-10-07 ENCOUNTER — Encounter (HOSPITAL_COMMUNITY): Payer: Self-pay

## 2021-10-07 DIAGNOSIS — M545 Low back pain, unspecified: Secondary | ICD-10-CM | POA: Insufficient documentation

## 2021-10-07 DIAGNOSIS — I509 Heart failure, unspecified: Secondary | ICD-10-CM | POA: Diagnosis not present

## 2021-10-07 DIAGNOSIS — G8929 Other chronic pain: Secondary | ICD-10-CM | POA: Insufficient documentation

## 2021-10-07 DIAGNOSIS — Z79899 Other long term (current) drug therapy: Secondary | ICD-10-CM | POA: Diagnosis not present

## 2021-10-07 DIAGNOSIS — M79605 Pain in left leg: Secondary | ICD-10-CM | POA: Diagnosis not present

## 2021-10-07 DIAGNOSIS — I110 Hypertensive heart disease with heart failure: Secondary | ICD-10-CM | POA: Insufficient documentation

## 2021-10-07 DIAGNOSIS — Z7982 Long term (current) use of aspirin: Secondary | ICD-10-CM | POA: Insufficient documentation

## 2021-10-07 DIAGNOSIS — Z1339 Encounter for screening examination for other mental health and behavioral disorders: Secondary | ICD-10-CM | POA: Diagnosis not present

## 2021-10-07 DIAGNOSIS — F29 Unspecified psychosis not due to a substance or known physiological condition: Secondary | ICD-10-CM | POA: Insufficient documentation

## 2021-10-07 DIAGNOSIS — Z20822 Contact with and (suspected) exposure to covid-19: Secondary | ICD-10-CM | POA: Insufficient documentation

## 2021-10-07 DIAGNOSIS — F22 Delusional disorders: Secondary | ICD-10-CM | POA: Insufficient documentation

## 2021-10-07 DIAGNOSIS — F209 Schizophrenia, unspecified: Secondary | ICD-10-CM | POA: Diagnosis present

## 2021-10-07 DIAGNOSIS — M79604 Pain in right leg: Secondary | ICD-10-CM | POA: Insufficient documentation

## 2021-10-07 DIAGNOSIS — Z760 Encounter for issue of repeat prescription: Secondary | ICD-10-CM | POA: Diagnosis not present

## 2021-10-07 LAB — CBC WITH DIFFERENTIAL/PLATELET
Abs Immature Granulocytes: 0.02 10*3/uL (ref 0.00–0.07)
Basophils Absolute: 0 10*3/uL (ref 0.0–0.1)
Basophils Relative: 0 %
Eosinophils Absolute: 0.1 10*3/uL (ref 0.0–0.5)
Eosinophils Relative: 2 %
HCT: 41.2 % (ref 36.0–46.0)
Hemoglobin: 13.3 g/dL (ref 12.0–15.0)
Immature Granulocytes: 0 %
Lymphocytes Relative: 34 %
Lymphs Abs: 1.8 10*3/uL (ref 0.7–4.0)
MCH: 29.4 pg (ref 26.0–34.0)
MCHC: 32.3 g/dL (ref 30.0–36.0)
MCV: 90.9 fL (ref 80.0–100.0)
Monocytes Absolute: 0.4 10*3/uL (ref 0.1–1.0)
Monocytes Relative: 8 %
Neutro Abs: 3 10*3/uL (ref 1.7–7.7)
Neutrophils Relative %: 56 %
Platelets: 314 10*3/uL (ref 150–400)
RBC: 4.53 MIL/uL (ref 3.87–5.11)
RDW: 14.5 % (ref 11.5–15.5)
WBC: 5.4 10*3/uL (ref 4.0–10.5)
nRBC: 0 % (ref 0.0–0.2)

## 2021-10-07 LAB — COMPREHENSIVE METABOLIC PANEL
ALT: 12 U/L (ref 0–44)
AST: 20 U/L (ref 15–41)
Albumin: 3.6 g/dL (ref 3.5–5.0)
Alkaline Phosphatase: 86 U/L (ref 38–126)
Anion gap: 5 (ref 5–15)
BUN: 21 mg/dL (ref 8–23)
CO2: 27 mmol/L (ref 22–32)
Calcium: 9.7 mg/dL (ref 8.9–10.3)
Chloride: 108 mmol/L (ref 98–111)
Creatinine, Ser: 1.27 mg/dL — ABNORMAL HIGH (ref 0.44–1.00)
GFR, Estimated: 45 mL/min — ABNORMAL LOW (ref >60)
Glucose, Bld: 103 mg/dL — ABNORMAL HIGH (ref 70–99)
Potassium: 4.1 mmol/L (ref 3.5–5.1)
Sodium: 140 mmol/L (ref 135–145)
Total Bilirubin: 0.4 mg/dL (ref 0.3–1.2)
Total Protein: 7.9 g/dL (ref 6.5–8.1)

## 2021-10-07 LAB — RAPID URINE DRUG SCREEN, HOSP PERFORMED
Amphetamines: NOT DETECTED
Barbiturates: NOT DETECTED
Benzodiazepines: NOT DETECTED
Cocaine: NOT DETECTED
Opiates: NOT DETECTED
Tetrahydrocannabinol: NOT DETECTED

## 2021-10-07 LAB — RESP PANEL BY RT-PCR (FLU A&B, COVID) ARPGX2
Influenza A by PCR: NEGATIVE
Influenza B by PCR: NEGATIVE
SARS Coronavirus 2 by RT PCR: NEGATIVE

## 2021-10-07 LAB — ETHANOL: Alcohol, Ethyl (B): <10 mg/dL (ref <10)

## 2021-10-07 MED ORDER — ZIPRASIDONE MESYLATE 20 MG IM SOLR
20.0000 mg | Freq: Once | INTRAMUSCULAR | Status: AC
  Administered 2021-10-07: 20 mg via INTRAMUSCULAR
  Filled 2021-10-07: qty 20

## 2021-10-07 MED ORDER — STERILE WATER FOR INJECTION IJ SOLN
INTRAMUSCULAR | Status: AC
  Administered 2021-10-07: 2 mL
  Filled 2021-10-07: qty 10
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
## 2021-10-07 NOTE — BH Assessment (Signed)
Clinician messaged Kady L. Leatha Gilding, EMT-P: "Hey. It's Trey with TTS. Is the pt able to engage in the assessment, if so the pt will need to be placed in a private room. Is the pt under IVC? Also is the pt medically cleared?"    Clinician awaiting response.    Redmond Pulling, MS, [facility], [facility] Triage Specialist [phone]

## 2021-10-07 NOTE — ED Provider Notes (Signed)
[facility]-EMERGENCY DEPT Provider Note   CSN: [id number] Arrival date & time: 10/07/21  1352     History  Chief Complaint  Patient presents with   Psychiatric Evaluation    ZAURIA DOMBEK is a 71 y.o. female with history of hypertension, hyperlipidemia, CHF, obesity, schizophrenia presenting to the emergency department for leg pain.  Patient medical alert necklace, on EMS arrival complained of being tortured in her legs.  Reports pain in back as well.  Patient reports this pain is chronic.  Patient unable to explain where she lives or why she pressed the button or why she thinks she is being tortured.  She denies hallucinations.  She reports she does not take any psychiatric medications.  Denies any trauma to her back.  Denies any numbness or tingling.  Denies weakness.  Also request refill of her Coreg and amlodipine.  She denies fevers or chills.  Denies headaches.  She denies painful urination, abdominal pain, nausea, vomiting, diarrhea.  HPI     Home Medications Prior to Admission medications   Medication Sig Start Date End Date Taking? Authorizing Provider  amLODipine (NORVASC) 10 MG tablet Take 1 tablet (10 mg total) by mouth daily. 06/27/21   Charlynne Pander, MD  aspirin EC 81 MG EC tablet Take 1 tablet (81 mg total) by mouth daily. Swallow whole. Patient not taking: Reported on 09/10/2020 05/21/20   Glenford Bayley, MD  atorvastatin (LIPITOR) 40 MG tablet Take 1 tablet (40 mg total) by mouth daily. Patient not taking: Reported on 07/21/2021 02/15/21   Hoy Register, MD  carvedilol (COREG) 25 MG tablet Take 1 tablet (25 mg total) by mouth 2 (two) times daily with a meal. 06/27/21   Charlynne Pander, MD  ciclopirox [facility]) 8 % solution Apply topically at bedtime. Apply over toenails. Apply daily over previous coat. After seven (7) days, remove with rubbing alcohol and repeat the cycle. 05/06/21   Marcine Matar, MD  ferrous gluconate (FERGON) 324 MG tablet  Take 1 tablet (324 mg total) by mouth daily with breakfast. Patient not taking: Reported on 07/21/2021 05/21/20   Glenford Bayley, MD  furosemide (LASIX) 20 MG tablet Take 1 tablet (20 mg total) by mouth daily. Patient not taking: Reported on 07/21/2021 02/15/21   Hoy Register, MD  hydrALAZINE (APRESOLINE) 100 MG tablet TAKE 1 TABLET (100 MG TOTAL) BY MOUTH TWO TIMES DAILY. Patient taking differently: Take 100 mg by mouth 2 (two) times daily. 06/27/21 06/27/22  Charlynne Pander, MD  isosorbide mononitrate (IMDUR) 60 MG 24 hr tablet TAKE 1 TABLET (60 MG TOTAL) BY MOUTH DAILY. Patient not taking: Reported on 07/21/2021 02/15/21 02/15/22  Hoy Register, MD  OLANZapine zydis (ZYPREXA) 10 MG disintegrating tablet Take 1 tablet (10 mg total) by mouth in the morning. Patient not taking: Reported on 07/21/2021 05/20/20   Glenford Bayley, MD  OLANZapine zydis (ZYPREXA) 20 MG disintegrating tablet Take 1 tablet (20 mg total) by mouth at bedtime. Patient not taking: Reported on 07/21/2021 05/20/20   Glenford Bayley, MD  spironolactone (ALDACTONE) 25 MG tablet TAKE 1/2 TABLET (12.5 MG TOTAL) BY MOUTH DAILY. Patient not taking: Reported on 07/21/2021 02/15/21 02/15/22  Hoy Register, MD  triamcinolone cream (KENALOG) 0.1 % Apply 1 application topically 2 (two) times daily. Apply to rash on left thigh 05/06/21   Marcine Matar, MD      Allergies    Patient has no known allergies.    Review of Systems   Review of  Systems  All other systems reviewed and are negative.   Physical Exam Updated Vital Signs BP (!) 143/100 (BP Location: Right Arm)   Pulse 88   Temp 98.2 F (36.8 C) (Oral)   Resp 17   LMP 05/10/2013   SpO2 97%  Physical Exam Vitals and nursing note reviewed.  Constitutional:      General: She is not in acute distress.    Appearance: She is well-developed.  HENT:     Head: Normocephalic and atraumatic.     Mouth/Throat:     Mouth: Mucous membranes are moist.  Eyes:     Pupils:  Pupils are equal, round, and reactive to light.  Cardiovascular:     Rate and Rhythm: Normal rate and regular rhythm.     Heart sounds: No murmur heard. Pulmonary:     Effort: Pulmonary effort is normal. No respiratory distress.     Breath sounds: Normal breath sounds.  Abdominal:     General: Abdomen is flat.     Palpations: Abdomen is soft.     Tenderness: There is no abdominal tenderness.  Musculoskeletal:        General: No tenderness.     Right lower leg: No edema.     Left lower leg: No edema.     Comments: Full range of motion of the bilateral lower extremities without focal tenderness, swelling, erythema, warmth.  Strength normal in the bilateral lower extremities.  No midline T or L-spine tenderness.  Skin:    General: Skin is warm and dry.  Neurological:     General: No focal deficit present.     Mental Status: She is alert. Mental status is at baseline.  Psychiatric:        Attention and Perception: She is inattentive.        Mood and Affect: Affect is labile.        Speech: Speech is tangential.        Behavior: Behavior is not aggressive or hyperactive. Behavior is cooperative.        Thought Content: Thought content is paranoid and delusional. Thought content does not include homicidal or suicidal ideation. Thought content does not include homicidal or suicidal plan.     Comments: Laughing inappropriately, actively responding to internal stimuli     ED Results / Procedures / Treatments   Labs (all labs ordered are listed, but only abnormal results are displayed) Labs Reviewed  COMPREHENSIVE METABOLIC PANEL - Abnormal; Notable for the following components:      Result Value   Glucose, Bld 103 (*)    Creatinine, Ser 1.27 (*)    GFR, Estimated 45 (*)    All other components within normal limits  RESP PANEL BY RT-PCR (FLU A&B, COVID) ARPGX2  ETHANOL  RAPID URINE DRUG SCREEN, HOSP PERFORMED  CBC WITH DIFFERENTIAL/PLATELET    EKG None  Radiology DG Lumbar  Spine 2-3 Views  Result Date: 10/07/2021 CLINICAL DATA:  Back pain. EXAM: LUMBAR SPINE - 2-3 VIEW COMPARISON:  03/05/2018 FINDINGS: No evidence for an acute fracture. No subluxation. Posterior fusion hardware again seen at L3, L4, and L5. No evidence for hardware complication. SI joints unremarkable. IMPRESSION: Stable.  No acute bony abnormality. Electronically Signed   By: Kennith Center M.D.   On: 10/07/2021 15:50    Procedures Procedures    Medications Ordered in ED Medications  ziprasidone (GEODON) injection 20 mg (20 mg Intramuscular Given 10/07/21 1719)  sterile water (preservative free) injection (2 mLs  Given  10/07/21 1720)    ED Course/ Medical Decision Making/ A&P                           Medical Decision Making Amount and/or Complexity of Data Reviewed Labs: ordered. Radiology: ordered.  Risk Prescription drug management.   71 year old female with history of schizophrenia presenting via PD after activating medical alert bracelet, now complaining of leg pain and requesting medication refill.  On exam, patient has no focal leg findings to explain her pain, able to ambulate and range extremities, no sign of cellulitis, traumatic injury, edema, calf tenderness. doubt occult process such as DVT.  Although she complains of back pain, she denies any red flag symptoms for spinal cord compression, fracture, occult infectious process, and has no midline tenderness.  Obtain plain films of lumbar spine which was negative.  Patient grossly disorganized, able to provide answers about current medical symptoms, but unable to explain where she lives, how she got here, why she pressed medical alert button, what being tortured means, which medication she takes regularly, how she obtains food.  Noted to be actively responding to internal stimuli and laughing to self inappropriately.  Given patient's gross disorganization, I feel patient is danger to self, placed on IVC, consulted psychiatry.  Medically cleared pending ECG at time of sign out.         Final Clinical Impression(s) / ED Diagnoses Final diagnoses:  None    Rx / DC Orders ED Discharge Orders     None         Lonell Grandchild, MD 10/07/21 1745

## 2021-10-07 NOTE — ED Notes (Signed)
Pt was dressed out into burgundy scrubs. Pt belongings were collected she has a purse, cell phone, key, shirt, jacket, shoes and pants. Pt has one bag under the cabinets in the nursing station in triage. She was wanded by security.

## 2021-10-08 DIAGNOSIS — F209 Schizophrenia, unspecified: Secondary | ICD-10-CM | POA: Diagnosis not present

## 2021-10-08 LAB — URINALYSIS, ROUTINE W REFLEX MICROSCOPIC
Bacteria, UA: NONE SEEN
Bilirubin Urine: NEGATIVE
Glucose, UA: NEGATIVE mg/dL
Hgb urine dipstick: NEGATIVE
Ketones, ur: NEGATIVE mg/dL
Leukocytes,Ua: NEGATIVE
Nitrite: NEGATIVE
Protein, ur: 100 mg/dL — AB
Specific Gravity, Urine: 1.021 (ref 1.005–1.030)
pH: 6 (ref 5.0–8.0)

## 2021-10-08 LAB — ACETAMINOPHEN LEVEL: Acetaminophen (Tylenol), Serum: <10 ug/mL — ABNORMAL LOW (ref 10–30)

## 2021-10-08 MED ORDER — OLANZAPINE 10 MG PO TBDP
10.0000 mg | ORAL_TABLET | Freq: Every day | ORAL | Status: DC
  Administered 2021-10-08 – 2021-10-10 (×2): 10 mg via ORAL
  Filled 2021-10-08 (×2): qty 1

## 2021-10-08 MED ORDER — CARVEDILOL 12.5 MG PO TABS
25.0000 mg | ORAL_TABLET | Freq: Two times a day (BID) | ORAL | Status: DC
  Administered 2021-10-08 – 2021-10-10 (×5): 25 mg via ORAL
  Filled 2021-10-08 (×5): qty 2

## 2021-10-08 MED ORDER — AMLODIPINE BESYLATE 5 MG PO TABS
10.0000 mg | ORAL_TABLET | Freq: Every day | ORAL | Status: DC
  Administered 2021-10-08 – 2021-10-10 (×3): 10 mg via ORAL
  Filled 2021-10-08 (×3): qty 2

## 2021-10-08 NOTE — ED Notes (Signed)
Pt adamantly refused her dinner tray. She stated she was not hungry and she did not want to eat "that crap".

## 2021-10-08 NOTE — Progress Notes (Signed)
Per Roselyn Bering, NP, patient meets criteria for inpatient treatment. There are no available beds at [facility] today. CSW faxed referrals to the following facilities for review:  [facility]  Pending - No Request Sent N/A [address] [place] [address] [phone] --  [facility]  Pending - No Request Sent N/A [address] [place] [address] [phone] --  [facility]  Pending - No Request Sent N/A [address] [place], [place] [address] [phone] --  [facility]  Pending - No Request Sent N/A [address] [place] [address] [phone] --  [facility]  Pending - No Request Sent N/A [address], [place] [address] [phone] --  [facility]  Pending - No Request Sent N/A [address] [place] [address] [phone] --  [facility]  Pending - No Request Sent N/A [address] [place] [address] [phone] --  [facility]  Pending - No Request Sent N/A [address], [place] [address] [phone] --   TTS will continue to seek bed placement.  Crissie Reese, MSW, [facility] Phone: [phone] Disposition/TOC

## 2021-10-08 NOTE — ED Notes (Signed)
Breakfast tray delivered to bedside.

## 2021-10-08 NOTE — Consult Note (Addendum)
  Patient was seen in the triage room resting this morning.  She was seen calm and cooperative.  She reported she came in to obtain prescription for high blood pressure medication.  She also stated that she does not know why she is being seen by Psychiatrist.  Patient has diagnosis of Paranoid Schizophrenia and does not know who her outpatient Psychiatrist is or what medications she takes.  Per nursing staff she is responding to internal stimuli and constantly talking to unseen people.  [facility] interested in taking her for inpatient hospitalization.  We also faxed records to other facilities for inpatient Geropsychiatry care.  Olanzapine 10 mg is prescribed -home medication.

## 2021-10-08 NOTE — ED Provider Notes (Signed)
Emergency Medicine Observation Re-evaluation Note  Erin Riley is a 71 y.o. female, seen on rounds today.  Pt initially presented to the ED for complaints of Psychiatric Evaluation Currently, the patient is sleeping.  Physical Exam  BP (!) 149/87   Pulse 85   Temp 97.8 F (36.6 C) (Oral)   Resp 18   LMP 05/10/2013   SpO2 98%  Physical Exam General: Sleeping Cardiac: Extremities are well-perfused Lungs: Breathing is unlabored Psych: Deferred  ED Course / MDM  EKG:   I have reviewed the labs performed to date as well as medications administered while in observation.  Recent changes in the last 24 hours include this patient presented to the ED yesterday for delusions and psychotic symptoms.  She has been evaluated by TTS recommends Geri-psych inpatient placement  Plan  Current plan is for inpatient psychiatric admission. Erin Riley is under involuntary commitment.      [name], MD 10/09/21 [phone]

## 2021-10-08 NOTE — ED Notes (Signed)
Pt aware that a urine sample is needed. 2 cups of water given to pt. Sitter aware as well and stated she would remind pt and assist her to the restroom.

## 2021-10-08 NOTE — Progress Notes (Signed)
CSW spoke with Britta Mccreedy with [facility] admissions. Britta Mccreedy is requesting a UA, Tylenol level, IVC paperwork be faxed to [phone].    TTS will continue to seek bed placement.  Crissie Reese, MSW, [facility] Phone: [phone] Disposition/TOC

## 2021-10-09 DIAGNOSIS — F209 Schizophrenia, unspecified: Secondary | ICD-10-CM | POA: Diagnosis not present

## 2021-10-09 MED ORDER — OLANZAPINE 5 MG PO TBDP: 5.0000 mg | ORAL_TABLET | Freq: Three times a day (TID) | ORAL | Status: DC | PRN

## 2021-10-09 MED ORDER — ACETAMINOPHEN 325 MG PO TABS
650.0000 mg | ORAL_TABLET | Freq: Once | ORAL | Status: AC
  Administered 2021-10-09: 650 mg via ORAL
  Filled 2021-10-09: qty 2

## 2021-10-09 MED ORDER — ZIPRASIDONE MESYLATE 20 MG IM SOLR: 20.0000 mg | INTRAMUSCULAR | Status: DC | PRN

## 2021-10-09 MED ORDER — LORAZEPAM 1 MG PO TABS
1.0000 mg | ORAL_TABLET | ORAL | Status: DC | PRN
  Filled 2021-10-09: qty 1

## 2021-10-09 NOTE — ED Provider Notes (Signed)
Emergency Medicine Observation Re-evaluation Note  Erin Riley is a 71 y.o. female, seen on rounds today.  Pt initially presented to the ED for complaints of Psychiatric Evaluation Currently, the patient is sleeping.  Physical Exam  BP (!) 139/97 (BP Location: Right Arm)   Pulse 68   Temp 97.8 F (36.6 C) (Oral)   Resp 18   LMP 05/10/2013   SpO2 98%  Physical Exam General: Sleeping Cardiac: Extremities are well-perfused Lungs: Breathing is unlabored Psych: Deferred  ED Course / MDM  EKG:   I have reviewed the labs performed to date as well as medications administered while in observation.  Recent changes in the last 24 hours include none.  Plan  Current plan is for Geri-psych admission. Erin Riley is under involuntary commitment.      [name], MD 10/09/21 1340

## 2021-10-09 NOTE — ED Notes (Signed)
Pt given meds.

## 2021-10-09 NOTE — ED Notes (Signed)
Patient urinated all over the bed and floor. Patient cleaned, bed wiped down, new pants provided.

## 2021-10-09 NOTE — Consult Note (Signed)
Patient continues to require Psychiatric inpatient Hospitalization.  Per Nursing staff patient continues to have conversation with unseen people.  Patient is cooperative and medication compliance.  We will continue to seek bed placement.

## 2021-10-10 ENCOUNTER — Encounter (HOSPITAL_COMMUNITY): Payer: Self-pay | Admitting: Emergency Medicine

## 2021-10-10 DIAGNOSIS — F209 Schizophrenia, unspecified: Secondary | ICD-10-CM | POA: Diagnosis not present

## 2021-10-10 NOTE — ED Notes (Signed)
Pt eat 100% of her breakfast.  

## 2021-10-10 NOTE — ED Notes (Signed)
Pt refused vitals before transport. Vitals from earlier today used for EMTALA.

## 2021-10-10 NOTE — ED Notes (Signed)
Pt discharged with sheriff to [facility].

## 2021-10-10 NOTE — ED Notes (Signed)
[facility] called about possible placement. They are going to speak with their MD and call back. They're requesting EKG if not already done.

## 2021-10-10 NOTE — BH Assessment (Signed)
[facility] Assessment Progress Note   Per Dahlia Byes, NP, this pt requires psychiatric hospitalization at this time.  Pt presents under IVC initiated by EDP Alvino Blood, MD.  At 12:01 Para March calls from [facility] to report that pt has been accepted to their facility by Claudette Head, NP.  Julieanne Cotton concurs with this decision.  EDP Rolan Bucco, MD and charge nurse Patty have been notified.  Please call nursing report to [phone].  Pt is to be transported via [facility].  Doylene Canning Behavioral Health Coordinator [phone]

## 2021-10-10 NOTE — ED Notes (Signed)
Pt got her lunch tray.  

## 2021-10-10 NOTE — ED Provider Notes (Signed)
Emergency Medicine Observation Re-evaluation Note  Erin Riley is a 71 y.o. female, seen on rounds today.  Pt initially presented to the ED for complaints of Psychiatric Evaluation Currently, the patient is eating breakfast.  Physical Exam  BP (!) 161/93 (BP Location: Right Arm)   Pulse 68   Temp 97.7 F (36.5 C) (Oral)   Resp 18   LMP 05/10/2013   SpO2 97%  Physical Exam General: No acute distress Cardiac: Normal rate Lungs: No increased work of breathing Psych: Calm  ED Course / MDM  EKG:   I have reviewed the labs performed to date as well as medications administered while in observation.  Recent changes in the last 24 hours include none.  Plan  Current plan is for awaiting placement. [facility] is under involuntary commitment.      Rolan Bucco, MD 10/10/21 [phone]

## 2021-10-10 NOTE — ED Notes (Signed)
Pt refuse vitals check.

## 2021-10-10 NOTE — ED Notes (Signed)
Report given to receiving facility   was advised to call back with departure time

## 2021-10-10 NOTE — ED Notes (Signed)
Pt got her breakfast tray.  

## 2022-03-25 ENCOUNTER — Other Ambulatory Visit: Payer: Self-pay

## 2022-03-25 ENCOUNTER — Inpatient Hospital Stay (HOSPITAL_COMMUNITY)
Admission: EM | Admit: 2022-03-25 | Discharge: 2022-03-28 | DRG: 291 | Disposition: A | Discharge status: Home / Self Care | Payer: 59 | Attending: Internal Medicine | Admitting: Internal Medicine

## 2022-03-25 ENCOUNTER — Emergency Department (HOSPITAL_COMMUNITY): Payer: 59

## 2022-03-25 ENCOUNTER — Encounter (HOSPITAL_COMMUNITY): Payer: Self-pay | Admitting: Internal Medicine

## 2022-03-25 DIAGNOSIS — N1831 Chronic kidney disease, stage 3a: Secondary | ICD-10-CM | POA: Diagnosis present

## 2022-03-25 DIAGNOSIS — I130 Hypertensive heart and chronic kidney disease with heart failure and stage 1 through stage 4 chronic kidney disease, or unspecified chronic kidney disease: Principal | ICD-10-CM | POA: Diagnosis present

## 2022-03-25 DIAGNOSIS — I5043 Acute on chronic combined systolic (congestive) and diastolic (congestive) heart failure: Secondary | ICD-10-CM | POA: Diagnosis not present

## 2022-03-25 DIAGNOSIS — I420 Dilated cardiomyopathy: Secondary | ICD-10-CM | POA: Diagnosis present

## 2022-03-25 DIAGNOSIS — G8929 Other chronic pain: Secondary | ICD-10-CM | POA: Diagnosis present

## 2022-03-25 DIAGNOSIS — I160 Hypertensive urgency: Secondary | ICD-10-CM | POA: Diagnosis present

## 2022-03-25 DIAGNOSIS — Z7982 Long term (current) use of aspirin: Secondary | ICD-10-CM

## 2022-03-25 DIAGNOSIS — I739 Peripheral vascular disease, unspecified: Secondary | ICD-10-CM | POA: Diagnosis present

## 2022-03-25 DIAGNOSIS — D631 Anemia in chronic kidney disease: Secondary | ICD-10-CM | POA: Diagnosis present

## 2022-03-25 DIAGNOSIS — I5189 Other ill-defined heart diseases: Secondary | ICD-10-CM

## 2022-03-25 DIAGNOSIS — M549 Dorsalgia, unspecified: Secondary | ICD-10-CM | POA: Diagnosis present

## 2022-03-25 DIAGNOSIS — Z91199 Patient's noncompliance with other medical treatment and regimen due to unspecified reason: Secondary | ICD-10-CM

## 2022-03-25 DIAGNOSIS — E669 Obesity, unspecified: Secondary | ICD-10-CM | POA: Diagnosis present

## 2022-03-25 DIAGNOSIS — I10 Essential (primary) hypertension: Secondary | ICD-10-CM | POA: Diagnosis present

## 2022-03-25 DIAGNOSIS — I169 Hypertensive crisis, unspecified: Secondary | ICD-10-CM

## 2022-03-25 DIAGNOSIS — I350 Nonrheumatic aortic (valve) stenosis: Secondary | ICD-10-CM

## 2022-03-25 DIAGNOSIS — F17210 Nicotine dependence, cigarettes, uncomplicated: Secondary | ICD-10-CM | POA: Diagnosis present

## 2022-03-25 DIAGNOSIS — I422 Other hypertrophic cardiomyopathy: Secondary | ICD-10-CM | POA: Diagnosis present

## 2022-03-25 DIAGNOSIS — I509 Heart failure, unspecified: Principal | ICD-10-CM

## 2022-03-25 DIAGNOSIS — Z1152 Encounter for screening for COVID-19: Secondary | ICD-10-CM

## 2022-03-25 DIAGNOSIS — Z91148 Patient's other noncompliance with medication regimen for other reason: Secondary | ICD-10-CM

## 2022-03-25 DIAGNOSIS — N183 Chronic kidney disease, stage 3 unspecified: Secondary | ICD-10-CM | POA: Diagnosis present

## 2022-03-25 DIAGNOSIS — Z79899 Other long term (current) drug therapy: Secondary | ICD-10-CM

## 2022-03-25 DIAGNOSIS — I2510 Atherosclerotic heart disease of native coronary artery without angina pectoris: Secondary | ICD-10-CM | POA: Diagnosis present

## 2022-03-25 DIAGNOSIS — E785 Hyperlipidemia, unspecified: Secondary | ICD-10-CM

## 2022-03-25 DIAGNOSIS — N179 Acute kidney failure, unspecified: Secondary | ICD-10-CM | POA: Diagnosis present

## 2022-03-25 DIAGNOSIS — F200 Paranoid schizophrenia: Secondary | ICD-10-CM | POA: Diagnosis present

## 2022-03-25 DIAGNOSIS — D649 Anemia, unspecified: Secondary | ICD-10-CM | POA: Diagnosis present

## 2022-03-25 DIAGNOSIS — Z8249 Family history of ischemic heart disease and other diseases of the circulatory system: Secondary | ICD-10-CM

## 2022-03-25 DIAGNOSIS — I161 Hypertensive emergency: Secondary | ICD-10-CM

## 2022-03-25 DIAGNOSIS — Z6836 Body mass index (BMI) 36.0-36.9, adult: Secondary | ICD-10-CM

## 2022-03-25 LAB — BASIC METABOLIC PANEL
Anion gap: 7 (ref 5–15)
BUN: 21 mg/dL (ref 8–23)
CO2: 26 mmol/L (ref 22–32)
Calcium: 9.1 mg/dL (ref 8.9–10.3)
Chloride: 105 mmol/L (ref 98–111)
Creatinine, Ser: 1.07 mg/dL — ABNORMAL HIGH (ref 0.44–1.00)
GFR, Estimated: 56 mL/min — ABNORMAL LOW (ref >60)
Glucose, Bld: 151 mg/dL — ABNORMAL HIGH (ref 70–99)
Potassium: 3.6 mmol/L (ref 3.5–5.1)
Sodium: 138 mmol/L (ref 135–145)

## 2022-03-25 LAB — CBC WITH DIFFERENTIAL/PLATELET
Abs Immature Granulocytes: 0.02 10*3/uL (ref 0.00–0.07)
Basophils Absolute: 0 10*3/uL (ref 0.0–0.1)
Basophils Relative: 0 %
Eosinophils Absolute: 0.1 10*3/uL (ref 0.0–0.5)
Eosinophils Relative: 2 %
HCT: 31.3 % — ABNORMAL LOW (ref 36.0–46.0)
Hemoglobin: 9.7 g/dL — ABNORMAL LOW (ref 12.0–15.0)
Immature Granulocytes: 0 %
Lymphocytes Relative: 15 %
Lymphs Abs: 0.9 10*3/uL (ref 0.7–4.0)
MCH: 29.7 pg (ref 26.0–34.0)
MCHC: 31 g/dL (ref 30.0–36.0)
MCV: 95.7 fL (ref 80.0–100.0)
Monocytes Absolute: 0.3 10*3/uL (ref 0.1–1.0)
Monocytes Relative: 4 %
Neutro Abs: 4.9 10*3/uL (ref 1.7–7.7)
Neutrophils Relative %: 79 %
Platelets: 272 10*3/uL (ref 150–400)
RBC: 3.27 MIL/uL — ABNORMAL LOW (ref 3.87–5.11)
RDW: 14.6 % (ref 11.5–15.5)
WBC: 6.2 10*3/uL (ref 4.0–10.5)
nRBC: 0 % (ref 0.0–0.2)

## 2022-03-25 LAB — RESP PANEL BY RT-PCR (RSV, FLU A&B, COVID)  RVPGX2
Influenza A by PCR: NEGATIVE
Influenza B by PCR: NEGATIVE
Resp Syncytial Virus by PCR: NEGATIVE
SARS Coronavirus 2 by RT PCR: NEGATIVE

## 2022-03-25 LAB — BRAIN NATRIURETIC PEPTIDE: B Natriuretic Peptide: 1356.3 pg/mL — ABNORMAL HIGH (ref 0.0–100.0)

## 2022-03-25 MED ORDER — ACETAMINOPHEN 325 MG PO TABS: 650.0000 mg | ORAL_TABLET | Freq: Four times a day (QID) | ORAL | Status: DC | PRN

## 2022-03-25 MED ORDER — PROCHLORPERAZINE EDISYLATE 10 MG/2ML IJ SOLN: 10.0000 mg | Freq: Four times a day (QID) | INTRAMUSCULAR | Status: DC | PRN

## 2022-03-25 MED ORDER — ORAL CARE MOUTH RINSE: 15.0000 mL | OROMUCOSAL | Status: DC | PRN

## 2022-03-25 MED ORDER — ALBUTEROL SULFATE (2.5 MG/3ML) 0.083% IN NEBU
5.0000 mg | INHALATION_SOLUTION | Freq: Once | RESPIRATORY_TRACT | Status: AC
  Administered 2022-03-25: 5 mg via RESPIRATORY_TRACT
  Filled 2022-03-25: qty 6

## 2022-03-25 MED ORDER — IPRATROPIUM BROMIDE 0.02 % IN SOLN
0.5000 mg | Freq: Once | RESPIRATORY_TRACT | Status: AC
  Administered 2022-03-25: 0.5 mg via RESPIRATORY_TRACT
  Filled 2022-03-25: qty 2.5

## 2022-03-25 MED ORDER — CARVEDILOL 12.5 MG PO TABS
25.0000 mg | ORAL_TABLET | Freq: Once | ORAL | Status: AC
  Administered 2022-03-25: 25 mg via ORAL
  Filled 2022-03-25: qty 2

## 2022-03-25 MED ORDER — AMLODIPINE BESYLATE 5 MG PO TABS
10.0000 mg | ORAL_TABLET | Freq: Once | ORAL | Status: AC
  Administered 2022-03-25: 10 mg via ORAL
  Filled 2022-03-25: qty 2

## 2022-03-25 MED ORDER — FUROSEMIDE 10 MG/ML IJ SOLN
20.0000 mg | Freq: Once | INTRAMUSCULAR | Status: AC
  Administered 2022-03-25: 20 mg via INTRAVENOUS
  Filled 2022-03-25: qty 4

## 2022-03-25 MED ORDER — NITROGLYCERIN 2 % TD OINT
1.0000 [in_us] | TOPICAL_OINTMENT | Freq: Once | TRANSDERMAL | Status: AC
  Administered 2022-03-25: 1 [in_us] via TOPICAL
  Filled 2022-03-25: qty 1

## 2022-03-25 MED ORDER — ENOXAPARIN SODIUM 60 MG/0.6ML IJ SOSY
50.0000 mg | PREFILLED_SYRINGE | INTRAMUSCULAR | Status: DC
  Administered 2022-03-25 – 2022-03-27 (×3): 50 mg via SUBCUTANEOUS
  Filled 2022-03-25 (×3): qty 0.6

## 2022-03-25 MED ORDER — ACETAMINOPHEN 650 MG RE SUPP: 650.0000 mg | Freq: Four times a day (QID) | RECTAL | Status: DC | PRN

## 2022-03-25 MED ORDER — FUROSEMIDE 10 MG/ML IJ SOLN
40.0000 mg | Freq: Two times a day (BID) | INTRAMUSCULAR | Status: DC
  Administered 2022-03-25 – 2022-03-26 (×3): 40 mg via INTRAVENOUS
  Filled 2022-03-25 (×3): qty 4

## 2022-03-25 MED ORDER — ATORVASTATIN CALCIUM 40 MG PO TABS
40.0000 mg | ORAL_TABLET | Freq: Every day | ORAL | Status: DC
  Administered 2022-03-25 – 2022-03-28 (×4): 40 mg via ORAL
  Filled 2022-03-25 (×4): qty 1

## 2022-03-25 NOTE — Plan of Care (Signed)
Discussed with patient plan of care for the evening, pain management and admission questions with some teach back displayed.  Problem: Education: Goal: Ability to demonstrate management of disease process will improve Outcome: Progressing   Problem: Activity: Goal: Capacity to carry out activities will improve Outcome: Progressing

## 2022-03-25 NOTE — ED Provider Notes (Signed)
I provided a substantive portion of the care of this patient.  I personally performed the entirety of the medical decision making for this encounter.  EKG Interpretation  Date/Time:  Saturday March 25 2022 12:38:09 EST Ventricular Rate:  103 PR Interval:  175 QRS Duration: 105 QT Interval:  357 QTC Calculation: 468 R Axis:   18 Text Interpretation: Sinus tachycardia Biatrial enlargement LVH with secondary repolarization abnormality Confirmed by Lacretia Leigh ([id number]) on 03/25/2022 1:75:59 PM  72 year old female presents with cough congestion.  No URI symptoms.  On exam, she is not respiratory distress.  Called EMS for transport here.  Will chest x-ray as well as viral panel.   Lacretia Leigh, MD 03/25/22 1322

## 2022-03-25 NOTE — H&P (Signed)
History and Physical    Patient: Erin Riley LHT:[id number] DOB: 05/10/1950 DOA: 03/25/2022 DOS: the patient was seen and examined on 03/25/2022 PCP: Hoy Register, MD  Patient coming from: Home  Chief Complaint:  Chief Complaint  Patient presents with   Shortness of Breath   Nasal Congestion   HPI: MYRELLA FAHS is a 72 y.o. female with medical history significant of schizophrenia, HTN, HLD, chronic combined HF, CKD3a. Difficult historian as her history is changing with further questioning. Essentially, she has had several days of increased cough and mucus. She has become short of breath and has noticed some swelling in her legs. She reports some orthopnea. She states that she has not taken her regular medications because she was pregnant since last year and insists she has papers to prove it. She denies chest pain, palpitations, fevers, or sick contacts. When her symptoms did not improve today, she decided to come to the ED for evaluation.   Review of Systems: As mentioned in the history of present illness. All other systems reviewed and are negative. Past Medical History:  Diagnosis Date   Chronic back pain    Chronic kidney disease (CKD), stage II (mild)    Class I-II   Coronary artery disease 04/2009   50% stenosis in the perforator of LAD; catheterization was for an abnormal Myoview in January 2000 showing anterior and inferolateral ischemia.   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 > 3 subsequent Echos show improved/normal EF with moderate to severe concentric LVH and diastolic dysfunction with LVOT/intracavitary gradient --> 06/2016: Severe LVH.  Vigorous EF, 65-70%.?? Gr 1 DD. Mild AS.   Hyperlipidemia    Hypertension    Hypertensive hypertrophic cardiomyopathy: NYHA class II:  Echo: Severe concentric LVH with LV OT gradient; essentially preserved EF with diastolic  dysfunction 02/15/2013   Echo 06/2016: Severe Concentric LVH. Vigorous EF 65-70%. ~ Gr I DD.    Mild aortic stenosis by prior echocardiography    Echo 06/2016: Mild AS (Mean Gradient 15 mmHg); has had prior Mod-Severe MR (not seen on current echo)   PAD (peripheral artery disease) (HCC) March 2013   Lower extremity Dopplers: R. SFA 50-60%, R. PTA proximally occluded with distal reconstitution;; L. common iliac ~50%, L. SFA 50-70% stenosis, L. PTA < 50%   Schizophrenia (HCC)    Past Surgical History:  Procedure Laterality Date   BUNIONECTOMY     carotid doppler  05/29/2011   left bulb/prox ICA moderate amtfibrous plaque with no evidence significant reduction.,right bulb /proximal ICA normal patency   lower extremity doppler  05/29/2011   right SFA 50% to 59% diameter reduction,right posterior tibal atreery occlusive disease,reconstituting distally, left common illiac<50%,left SFA 50 to70%,left post. tibial <50%   NM MYOCAR PERF WALL MOTION  03/2009   Persantine; EF 51%-both anterior and inferolateral ischemia   TRANSTHORACIC ECHOCARDIOGRAM  06/2016   Severe LVH.  Vigorous EF of 65-70%.  No RWMA. ~Only grade 1 diastolic dysfunction.  Mild aortic stenosis (mean gradient 15 mmHg)   TRANSTHORACIC ECHOCARDIOGRAM  07/2012   EF 50-55%; severe concentric LVH; only grade 1 diastolic dysfunction. Mild aortic sclerosis - with LVOT /intracavitary gradient of roughly 20 mmHg mean. Mild to moderately dilated LA;; previously reported MR not seen   Social History:  reports that she has been smoking cigarettes. She has never used smokeless tobacco. She reports that she does not  drink alcohol and does not use drugs.  No Known Allergies  Family History  Problem Relation Age of Onset   Hypertension Mother    Breast cancer Neg Hx     Prior to Admission medications   Medication Sig Start Date End Date Taking? Authorizing Provider  amLODipine (NORVASC) 10 MG tablet Take 1 tablet (10 mg total) by mouth  daily. Patient not taking: Reported on 10/08/2021 06/27/21   Charlynne Pander, MD  aspirin EC 81 MG EC tablet Take 1 tablet (81 mg total) by mouth daily. Swallow whole. Patient not taking: Reported on 10/09/2021 05/21/20   Glenford Bayley, MD  atorvastatin (LIPITOR) 40 MG tablet Take 1 tablet (40 mg total) by mouth daily. Patient not taking: Reported on 07/21/2021 02/15/21   Hoy Register, MD  carvedilol (COREG) 25 MG tablet Take 1 tablet (25 mg total) by mouth 2 (two) times daily with a meal. Patient not taking: Reported on 10/08/2021 06/27/21   Charlynne Pander, MD  ciclopirox [facility]) 8 % solution Apply topically at bedtime. Apply over toenails. Apply daily over previous coat. After seven (7) days, remove with rubbing alcohol and repeat the cycle. Patient not taking: Reported on 10/09/2021 05/06/21   Marcine Matar, MD  furosemide (LASIX) 20 MG tablet Take 1 tablet (20 mg total) by mouth daily. Patient not taking: Reported on 10/09/2021 02/15/21   Hoy Register, MD  hydrALAZINE (APRESOLINE) 100 MG tablet TAKE 1 TABLET (100 MG TOTAL) BY MOUTH TWO TIMES DAILY. Patient not taking: Reported on 10/08/2021 06/27/21 06/27/22  Charlynne Pander, MD  isosorbide mononitrate (IMDUR) 60 MG 24 hr tablet TAKE 1 TABLET (60 MG TOTAL) BY MOUTH DAILY. Patient not taking: Reported on 10/09/2021 02/15/21 02/15/22  Hoy Register, MD  OLANZapine zydis (ZYPREXA) 10 MG disintegrating tablet Take 1 tablet (10 mg total) by mouth in the morning. Patient not taking: Reported on 07/21/2021 05/20/20   Glenford Bayley, MD  OLANZapine zydis (ZYPREXA) 20 MG disintegrating tablet Take 1 tablet (20 mg total) by mouth at bedtime. Patient not taking: Reported on 07/21/2021 05/20/20   Glenford Bayley, MD  spironolactone (ALDACTONE) 25 MG tablet TAKE 1/2 TABLET (12.5 MG TOTAL) BY MOUTH DAILY. Patient not taking: Reported on 07/21/2021 02/15/21 02/15/22  Hoy Register, MD  triamcinolone cream (KENALOG) 0.1 % Apply 1 application topically  2 (two) times daily. Apply to rash on left thigh 05/06/21   Marcine Matar, MD    Physical Exam: Vitals:   03/25/22 1430 03/25/22 1450 03/25/22 1509 03/25/22 1510  BP: (!) 164/124  (!) 162/112 (!) 162/112  Pulse: 91   84  Resp: 17   17  Temp:      TempSrc:      SpO2: 95% 94%  96%   General: 72 y.o. female resting in bed in NAD Eyes: PERRL, normal sclera ENMT: Nares patent w/o discharge, orophaynx clear, dentition normal, ears w/o discharge/lesions/ulcers Neck: Supple, trachea midline Cardiovascular: RRR, +S1, S2, no m/g/r, equal pulses throughout Respiratory: CTABL, no w/r/r, normal WOB GI: BS+, NDNT, no masses noted, no organomegaly noted MSK: No c/c; mild BLE pedal edema Skin: No rashes, bruises, ulcerations noted Neuro: A&O x 3, no focal deficits Psyc: agitated but cooperative  Data Reviewed:  Results for orders placed or performed during the hospital encounter of 03/25/22 (from the past 24 hour(s))  Resp panel by RT-PCR (RSV, Flu A&B, Covid) Anterior Nasal Swab     Status: None   Collection Time: 03/25/22 12:31 PM   Specimen: Anterior  Nasal Swab  Result Value Ref Range   SARS Coronavirus 2 by RT PCR NEGATIVE NEGATIVE   Influenza A by PCR NEGATIVE NEGATIVE   Influenza B by PCR NEGATIVE NEGATIVE   Resp Syncytial Virus by PCR NEGATIVE NEGATIVE  CBC with Differential     Status: Abnormal   Collection Time: 03/25/22 12:46 PM  Result Value Ref Range   WBC 6.2 4.0 - 10.5 K/uL   RBC 3.27 (L) 3.87 - 5.11 MIL/uL   Hemoglobin 9.7 (L) 12.0 - 15.0 g/dL   HCT 31.3 (L) 36.0 - 46.0 %   MCV 95.7 80.0 - 100.0 fL   MCH 29.7 26.0 - 34.0 pg   MCHC 31.0 30.0 - 36.0 g/dL   RDW 14.6 11.5 - 15.5 %   Platelets 272 150 - 400 K/uL   nRBC 0.0 0.0 - 0.2 %   Neutrophils Relative % 79 %   Neutro Abs 4.9 1.7 - 7.7 K/uL   Lymphocytes Relative 15 %   Lymphs Abs 0.9 0.7 - 4.0 K/uL   Monocytes Relative 4 %   Monocytes Absolute 0.3 0.1 - 1.0 K/uL   Eosinophils Relative 2 %   Eosinophils  Absolute 0.1 0.0 - 0.5 K/uL   Basophils Relative 0 %   Basophils Absolute 0.0 0.0 - 0.1 K/uL   Immature Granulocytes 0 %   Abs Immature Granulocytes 0.02 0.00 - 0.07 K/uL  Basic metabolic panel     Status: Abnormal   Collection Time: 03/25/22 12:46 PM  Result Value Ref Range   Sodium 138 135 - 145 mmol/L   Potassium 3.6 3.5 - 5.1 mmol/L   Chloride 105 98 - 111 mmol/L   CO2 26 22 - 32 mmol/L   Glucose, Bld 151 (H) 70 - 99 mg/dL   BUN 21 8 - 23 mg/dL   Creatinine, Ser 1.07 (H) 0.44 - 1.00 mg/dL   Calcium 9.1 8.9 - 10.3 mg/dL   GFR, Estimated 56 (L) >60 mL/min   Anion gap 7 5 - 15  Brain natriuretic peptide     Status: Abnormal   Collection Time: 03/25/22  1:56 PM  Result Value Ref Range   B Natriuretic Peptide 1,356.3 (H) 0.0 - 100.0 pg/mL   CXR: Low volume film with vascular congestion and diffuse hazy bilateral airspace opacity suggesting pulmonary edema.  Assessment and Plan: Acute on chronic combined diastolic/systolic HF     - place in obs, tele     - IV lasix     - echo     - I&O, daily weight  HTN     - given nitro paste; can continue for now     - resume home regimen when confirmed  HLD     - statin  Schizophrenia     - continue home regimen when confirmed  CKD 3a     - at baseline, follow  Normocytic anemia     - no evidence of bleed  Advance Care Planning:   Code Status: FULL  Consults: None  Family Communication: None at bedside  Severity of Illness: The appropriate patient status for this patient is OBSERVATION. Observation status is judged to be reasonable and necessary in order to provide the required intensity of service to ensure the patient's safety. The patient's presenting symptoms, physical exam findings, and initial radiographic and laboratory data in the context of their medical condition is felt to place them at decreased risk for further clinical deterioration. Furthermore, it is anticipated that the patient will be  medically stable for  discharge from the hospital within 2 midnights of admission.   Author: Jonnie Finner, DO 03/25/2022 3:27 PM  For on call review [email or web address].

## 2022-03-25 NOTE — ED Triage Notes (Signed)
EMS reports from home, called out for SOB and congestion since this morning.   BP 220/160 HR 145 RR 20 Sp02 98 2ltrs

## 2022-03-25 NOTE — ED Provider Notes (Signed)
[facility]PARTMENT AT [facility] Provider Note   CSN: [id number] Arrival date & time: 03/25/22  1204     History  Chief Complaint  Patient presents with   Shortness of Breath   Nasal Congestion    Erin Riley is a 72 y.o. female.  Patient with a history of hypertension, hyperlipidemia, combined CHF, schizophrenia presents to the emergency department for evaluation of cough, congestion, shortness of breath.  No fevers, ear pain, runny nose or sore throat.  She has had some wheezing.  No chest pain.  She denies worsening lower extremity swelling.  Unclear after asking several times if she is taking medications regularly.  Takes a bottle of mostly full hydralazine and amlodipine that was prescribed to her from the emergency department in April 2023.  She does tell me she does not take a fluid pill.  No nausea, vomiting, diarrhea.  Transported to the emergency department today by EMS.  Blood pressures and heart rate elevated.       Home Medications Prior to Admission medications   Medication Sig Start Date End Date Taking? Authorizing Provider  amLODipine (NORVASC) 10 MG tablet Take 1 tablet (10 mg total) by mouth daily. Patient not taking: Reported on 10/08/2021 06/27/21   Drenda Freeze, MD  aspirin EC 81 MG EC tablet Take 1 tablet (81 mg total) by mouth daily. Swallow whole. Patient not taking: Reported on 10/09/2021 05/21/20   Jeralyn Bennett, MD  atorvastatin (LIPITOR) 40 MG tablet Take 1 tablet (40 mg total) by mouth daily. Patient not taking: Reported on 07/21/2021 02/15/21   Charlott Rakes, MD  carvedilol (COREG) 25 MG tablet Take 1 tablet (25 mg total) by mouth 2 (two) times daily with a meal. Patient not taking: Reported on 10/08/2021 06/27/21   Drenda Freeze, MD  ciclopirox [facility]) 8 % solution Apply topically at bedtime. Apply over toenails. Apply daily over previous coat. After seven (7) days, remove with rubbing alcohol and repeat the  cycle. Patient not taking: Reported on 10/09/2021 05/06/21   Ladell Pier, MD  furosemide (LASIX) 20 MG tablet Take 1 tablet (20 mg total) by mouth daily. Patient not taking: Reported on 10/09/2021 02/15/21   Charlott Rakes, MD  hydrALAZINE (APRESOLINE) 100 MG tablet TAKE 1 TABLET (100 MG TOTAL) BY MOUTH TWO TIMES DAILY. Patient not taking: Reported on 10/08/2021 06/27/21 06/27/22  Drenda Freeze, MD  isosorbide mononitrate (IMDUR) 60 MG 24 hr tablet TAKE 1 TABLET (60 MG TOTAL) BY MOUTH DAILY. Patient not taking: Reported on 10/09/2021 02/15/21 02/15/22  Charlott Rakes, MD  OLANZapine zydis (ZYPREXA) 10 MG disintegrating tablet Take 1 tablet (10 mg total) by mouth in the morning. Patient not taking: Reported on 07/21/2021 05/20/20   Jeralyn Bennett, MD  OLANZapine zydis (ZYPREXA) 20 MG disintegrating tablet Take 1 tablet (20 mg total) by mouth at bedtime. Patient not taking: Reported on 07/21/2021 05/20/20   Jeralyn Bennett, MD  spironolactone (ALDACTONE) 25 MG tablet TAKE 1/2 TABLET (12.5 MG TOTAL) BY MOUTH DAILY. Patient not taking: Reported on 07/21/2021 02/15/21 02/15/22  Charlott Rakes, MD  triamcinolone cream (KENALOG) 0.1 % Apply 1 application topically 2 (two) times daily. Apply to rash on left thigh 05/06/21   Ladell Pier, MD      Allergies    Patient has no known allergies.    Review of Systems   Review of Systems  Physical Exam Updated Vital Signs BP (!) 201/113   Pulse 73   Temp  97.8 F (36.6 C) (Oral)   Resp (!) 22   LMP 05/10/2013   SpO2 96%   Physical Exam Vitals and nursing note reviewed.  Constitutional:      General: She is not in acute distress.    Appearance: She is well-developed.  HENT:     Head: Normocephalic and atraumatic.     Right Ear: External ear normal.     Left Ear: External ear normal.     Nose: Nose normal.  Eyes:     Conjunctiva/sclera: Conjunctivae normal.  Cardiovascular:     Rate and Rhythm: Regular rhythm. Tachycardia present.      Heart sounds: No murmur heard. Pulmonary:     Effort: No respiratory distress.     Breath sounds: Examination of the right-lower field reveals rales. Examination of the left-lower field reveals rales. Wheezing (mild, scattered) and rales (mild) present. No rhonchi.     Comments: Occasional coughing during exam Abdominal:     Palpations: Abdomen is soft.     Tenderness: There is no abdominal tenderness. There is no guarding or rebound.  Musculoskeletal:     Cervical back: Normal range of motion and neck supple.     Right lower leg: No tenderness. No edema.     Left lower leg: No tenderness. No edema.  Skin:    General: Skin is warm and dry.     Findings: No rash.  Neurological:     General: No focal deficit present.     Mental Status: She is alert. Mental status is at baseline.     Motor: No weakness.  Psychiatric:        Mood and Affect: Mood normal.     ED Results / Procedures / Treatments   Labs (all labs ordered are listed, but only abnormal results are displayed) Labs Reviewed  CBC WITH DIFFERENTIAL/PLATELET - Abnormal; Notable for the following components:      Result Value   RBC 3.27 (*)    Hemoglobin 9.7 (*)    HCT 31.3 (*)    All other components within normal limits  BASIC METABOLIC PANEL - Abnormal; Notable for the following components:   Glucose, Bld 151 (*)    Creatinine, Ser 1.07 (*)    GFR, Estimated 56 (*)    All other components within normal limits  BRAIN NATRIURETIC PEPTIDE - Abnormal; Notable for the following components:   B Natriuretic Peptide 1,356.3 (*)    All other components within normal limits  RESP PANEL BY RT-PCR (RSV, FLU A&B, COVID)  [id number]    EKG EKG Interpretation  Date/Time:  Saturday March 25 2022 12:38:09 EST Ventricular Rate:  103 PR Interval:  175 QRS Duration: 105 QT Interval:  357 QTC Calculation: 468 R Axis:   18 Text Interpretation: Sinus tachycardia Biatrial enlargement LVH with secondary repolarization abnormality  Confirmed by Lacretia Leigh ([id number]) on 03/25/2022 1:17:59 PM  Radiology DG Chest 2 View  Result Date: 03/25/2022 CLINICAL DATA:  Shortness of breath and congestion. EXAM: CHEST - 2 VIEW COMPARISON:  04/13/2021 FINDINGS: Low volume film. The cardio pericardial silhouette is enlarged. Vascular congestion noted with diffuse hazy bilateral airspace opacity. No substantial pleural effusion status post right shoulder replacement. Telemetry leads overlie the chest. IMPRESSION: Low volume film with vascular congestion and diffuse hazy bilateral airspace opacity suggesting pulmonary edema. Electronically Signed   By: Misty Stanley M.D.   On: 03/25/2022 13:43    Procedures Procedures    Medications Ordered in ED Medications  nitroGLYCERIN (NITROGLYN)  2 % ointment 1 inch (has no administration in time range)  albuterol (PROVENTIL) (2.5 MG/3ML) 0.083% nebulizer solution 5 mg (5 mg Nebulization Given 03/25/22 1244)  ipratropium (ATROVENT) nebulizer solution 0.5 mg (0.5 mg Nebulization Given 03/25/22 1244)  amLODipine (NORVASC) tablet 10 mg (10 mg Oral Given 03/25/22 1244)  carvedilol (COREG) tablet 25 mg (25 mg Oral Given 03/25/22 1244)  furosemide (LASIX) injection 20 mg (20 mg Intravenous Given 03/25/22 1413)    ED Course/ Medical Decision Making/ A&P    Patient seen and examined. History obtained directly from patient.   Labs/EKG: Ordered CBC, BMP, viral panel, EKG.  Imaging: Ordered chest x-ray.  Medications/Fluids: Ordered: Home doses, per med list, of Norvasc and Coreg.   Most recent vital signs reviewed and are as follows: BP (!) 201/113   Pulse 73   Temp 97.8 F (36.6 C) (Oral)   Resp (!) 22   LMP 05/10/2013   SpO2 96%   Initial impression: Cough and wheezing, initially low concern for fluid overload based clinically on exam.  3:00 PM Reassessment performed. Patient appears tachypneic, but no respiratory distress.  Blood pressures remain high despite oral meds on arrival.  I have  ordered nitroglycerin paste.  BNP was added after chest x-ray showed significant vascular congestion.   Labs personally reviewed and interpreted including: CBC with anemia compared to previous baseline at 9.7; BMP elevated blood sugar at 151 with normal anion gap, creatinine minimally elevated only at 1.07; BNP high at 1356; respiratory panel negative.  Imaging personally visualized and interpreted including: Chest x-ray, agree signs of edema  Reviewed pertinent lab work and imaging with patient at bedside. Questions answered.   Most current vital signs reviewed and are as follows: BP (!) 164/124   Pulse 91   Temp 97.8 F (36.6 C) (Oral)   Resp 17   LMP 05/10/2013   SpO2 94% Comment: while ambulating  Plan: Admit to hospital.  Given new findings on chest x-ray and elevated BNP, persistent elevated blood pressure, medication noncompliance --feel that she would be best served from admission to the hospital for acute treatment of congestive heart failure exacerbation.  I consulted with Dr. Ronaldo Miyamoto of [facility] who will see patient.                              Medical Decision Making Amount and/or Complexity of Data Reviewed Labs: ordered. Radiology: ordered.  Risk Prescription drug management.   Patient with worsening shortness of breath and congestion, initially thought likely related to respiratory illness, however workup most suggestive of CHF exacerbation with pulmonary vascular congestion on chest x-ray and elevated BNP.  Patient's blood pressures are significantly elevated despite administration of oral medications.  I do not feel that the patient has likely been compliant with her medications.  Due to poorly controlled blood pressure and associated congestive heart failure, will request admission to the hospital.  She denies any chest pain or shortness of breath to suggest ACS, PE at this time.  EKG is nonischemic.        Final Clinical Impression(s) / ED Diagnoses Final  diagnoses:  Acute on chronic congestive heart failure, unspecified heart failure type [facility])  Hypertensive emergency    Rx / DC Orders ED Discharge Orders     None         Renne Crigler, PA-C 03/25/22 1505    Lorre Nick, MD 03/26/22 [phone]

## 2022-03-26 ENCOUNTER — Observation Stay (HOSPITAL_BASED_OUTPATIENT_CLINIC_OR_DEPARTMENT_OTHER): Payer: 59

## 2022-03-26 DIAGNOSIS — F17210 Nicotine dependence, cigarettes, uncomplicated: Secondary | ICD-10-CM | POA: Diagnosis present

## 2022-03-26 DIAGNOSIS — Z8249 Family history of ischemic heart disease and other diseases of the circulatory system: Secondary | ICD-10-CM | POA: Diagnosis not present

## 2022-03-26 DIAGNOSIS — I5043 Acute on chronic combined systolic (congestive) and diastolic (congestive) heart failure: Secondary | ICD-10-CM

## 2022-03-26 DIAGNOSIS — N1831 Chronic kidney disease, stage 3a: Secondary | ICD-10-CM

## 2022-03-26 DIAGNOSIS — Z1152 Encounter for screening for COVID-19: Secondary | ICD-10-CM | POA: Diagnosis not present

## 2022-03-26 DIAGNOSIS — E785 Hyperlipidemia, unspecified: Secondary | ICD-10-CM

## 2022-03-26 DIAGNOSIS — I169 Hypertensive crisis, unspecified: Secondary | ICD-10-CM | POA: Diagnosis not present

## 2022-03-26 DIAGNOSIS — I2510 Atherosclerotic heart disease of native coronary artery without angina pectoris: Secondary | ICD-10-CM | POA: Diagnosis present

## 2022-03-26 DIAGNOSIS — F200 Paranoid schizophrenia: Secondary | ICD-10-CM

## 2022-03-26 DIAGNOSIS — N179 Acute kidney failure, unspecified: Secondary | ICD-10-CM | POA: Diagnosis present

## 2022-03-26 DIAGNOSIS — Z79899 Other long term (current) drug therapy: Secondary | ICD-10-CM | POA: Diagnosis not present

## 2022-03-26 DIAGNOSIS — M549 Dorsalgia, unspecified: Secondary | ICD-10-CM | POA: Diagnosis present

## 2022-03-26 DIAGNOSIS — I160 Hypertensive urgency: Secondary | ICD-10-CM | POA: Diagnosis not present

## 2022-03-26 DIAGNOSIS — Z91199 Patient's noncompliance with other medical treatment and regimen due to unspecified reason: Secondary | ICD-10-CM | POA: Diagnosis not present

## 2022-03-26 DIAGNOSIS — I420 Dilated cardiomyopathy: Secondary | ICD-10-CM | POA: Diagnosis present

## 2022-03-26 DIAGNOSIS — Z7982 Long term (current) use of aspirin: Secondary | ICD-10-CM | POA: Diagnosis not present

## 2022-03-26 DIAGNOSIS — I739 Peripheral vascular disease, unspecified: Secondary | ICD-10-CM | POA: Diagnosis present

## 2022-03-26 DIAGNOSIS — I130 Hypertensive heart and chronic kidney disease with heart failure and stage 1 through stage 4 chronic kidney disease, or unspecified chronic kidney disease: Secondary | ICD-10-CM | POA: Diagnosis present

## 2022-03-26 DIAGNOSIS — Z91148 Patient's other noncompliance with medication regimen for other reason: Secondary | ICD-10-CM | POA: Diagnosis not present

## 2022-03-26 DIAGNOSIS — I422 Other hypertrophic cardiomyopathy: Secondary | ICD-10-CM | POA: Diagnosis present

## 2022-03-26 DIAGNOSIS — E669 Obesity, unspecified: Secondary | ICD-10-CM | POA: Diagnosis present

## 2022-03-26 DIAGNOSIS — Z6836 Body mass index (BMI) 36.0-36.9, adult: Secondary | ICD-10-CM | POA: Diagnosis not present

## 2022-03-26 DIAGNOSIS — I161 Hypertensive emergency: Secondary | ICD-10-CM | POA: Diagnosis present

## 2022-03-26 DIAGNOSIS — G8929 Other chronic pain: Secondary | ICD-10-CM | POA: Diagnosis present

## 2022-03-26 DIAGNOSIS — I350 Nonrheumatic aortic (valve) stenosis: Secondary | ICD-10-CM

## 2022-03-26 DIAGNOSIS — D649 Anemia, unspecified: Secondary | ICD-10-CM

## 2022-03-26 DIAGNOSIS — D631 Anemia in chronic kidney disease: Secondary | ICD-10-CM | POA: Diagnosis present

## 2022-03-26 LAB — COMPREHENSIVE METABOLIC PANEL
ALT: 13 U/L (ref 0–44)
AST: 15 U/L (ref 15–41)
Albumin: 3.2 g/dL — ABNORMAL LOW (ref 3.5–5.0)
Alkaline Phosphatase: 73 U/L (ref 38–126)
Anion gap: 7 (ref 5–15)
BUN: 20 mg/dL (ref 8–23)
CO2: 27 mmol/L (ref 22–32)
Calcium: 8.6 mg/dL — ABNORMAL LOW (ref 8.9–10.3)
Chloride: 104 mmol/L (ref 98–111)
Creatinine, Ser: 1.41 mg/dL — ABNORMAL HIGH (ref 0.44–1.00)
GFR, Estimated: 40 mL/min — ABNORMAL LOW (ref >60)
Glucose, Bld: 87 mg/dL (ref 70–99)
Potassium: 3.8 mmol/L (ref 3.5–5.1)
Sodium: 138 mmol/L (ref 135–145)
Total Bilirubin: 1 mg/dL (ref 0.3–1.2)
Total Protein: 6.9 g/dL (ref 6.5–8.1)

## 2022-03-26 LAB — ECHOCARDIOGRAM COMPLETE
AV Mean grad: 33.3 mmHg
AV Peak grad: 46 mmHg
Ao pk vel: 3.39 m/s
Area-P 1/2: 3.72 cm2
Calc EF: 22.6 %
Height: 67 in
MV M vel: 5.71 m/s
MV Peak grad: 130.5 mmHg
P 1/2 time: 377 msec
S' Lateral: 4.8 cm
Single Plane A2C EF: 22.5 %
Single Plane A4C EF: 20.5 %
Weight: 3682.56 oz

## 2022-03-26 LAB — HIV ANTIBODY (ROUTINE TESTING W REFLEX): HIV Screen 4th Generation wRfx: NONREACTIVE

## 2022-03-26 LAB — CBC
HCT: 28.9 % — ABNORMAL LOW (ref 36.0–46.0)
Hemoglobin: 9 g/dL — ABNORMAL LOW (ref 12.0–15.0)
MCH: 29.4 pg (ref 26.0–34.0)
MCHC: 31.1 g/dL (ref 30.0–36.0)
MCV: 94.4 fL (ref 80.0–100.0)
Platelets: 265 10*3/uL (ref 150–400)
RBC: 3.06 MIL/uL — ABNORMAL LOW (ref 3.87–5.11)
RDW: 14.7 % (ref 11.5–15.5)
WBC: 5.3 10*3/uL (ref 4.0–10.5)
nRBC: 0 % (ref 0.0–0.2)

## 2022-03-26 LAB — TROPONIN I (HIGH SENSITIVITY): Troponin I (High Sensitivity): 44 ng/L — ABNORMAL HIGH (ref <18)

## 2022-03-26 LAB — GLUCOSE, CAPILLARY: Glucose-Capillary: 115 mg/dL — ABNORMAL HIGH (ref 70–99)

## 2022-03-26 MED ORDER — SPIRONOLACTONE 12.5 MG HALF TABLET
12.5000 mg | ORAL_TABLET | Freq: Every day | ORAL | Status: DC
  Administered 2022-03-26: 12.5 mg via ORAL
  Filled 2022-03-26 (×2): qty 1

## 2022-03-26 MED ORDER — BENZONATATE 100 MG PO CAPS: 200.0000 mg | ORAL_CAPSULE | Freq: Three times a day (TID) | ORAL | Status: DC | PRN

## 2022-03-26 MED ORDER — OLANZAPINE 5 MG PO TBDP
10.0000 mg | ORAL_TABLET | Freq: Every day | ORAL | Status: DC
  Administered 2022-03-26: 10 mg via ORAL
  Filled 2022-03-26: qty 2

## 2022-03-26 MED ORDER — HYDRALAZINE HCL 50 MG PO TABS
100.0000 mg | ORAL_TABLET | Freq: Two times a day (BID) | ORAL | Status: DC
  Administered 2022-03-26: 100 mg via ORAL
  Filled 2022-03-26: qty 2

## 2022-03-26 MED ORDER — PERFLUTREN LIPID MICROSPHERE
1.0000 mL | INTRAVENOUS | Status: AC | PRN
  Administered 2022-03-26: 3 mL via INTRAVENOUS

## 2022-03-26 MED ORDER — OLANZAPINE 5 MG PO TBDP: 20.0000 mg | ORAL_TABLET | Freq: Every day | ORAL | Status: DC

## 2022-03-26 MED ORDER — HYDRALAZINE HCL 50 MG PO TABS
50.0000 mg | ORAL_TABLET | Freq: Two times a day (BID) | ORAL | Status: DC
  Administered 2022-03-26 – 2022-03-28 (×4): 50 mg via ORAL
  Filled 2022-03-26 (×4): qty 1

## 2022-03-26 MED ORDER — AMLODIPINE BESYLATE 10 MG PO TABS: 10.0000 mg | ORAL_TABLET | Freq: Every day | ORAL | Status: DC

## 2022-03-26 MED ORDER — AMLODIPINE BESYLATE 10 MG PO TABS
5.0000 mg | ORAL_TABLET | Freq: Every day | ORAL | Status: DC
  Administered 2022-03-26: 5 mg via ORAL
  Filled 2022-03-26: qty 1

## 2022-03-26 MED ORDER — OLANZAPINE 5 MG PO TBDP
10.0000 mg | ORAL_TABLET | Freq: Every day | ORAL | Status: DC
  Administered 2022-03-26 – 2022-03-27 (×2): 10 mg via ORAL
  Filled 2022-03-26 (×2): qty 2

## 2022-03-26 MED ORDER — ISOSORBIDE MONONITRATE ER 60 MG PO TB24
60.0000 mg | ORAL_TABLET | Freq: Every day | ORAL | Status: DC
  Administered 2022-03-26 – 2022-03-28 (×3): 60 mg via ORAL
  Filled 2022-03-26 (×3): qty 1

## 2022-03-26 MED ORDER — ASPIRIN 81 MG PO TBEC
81.0000 mg | DELAYED_RELEASE_TABLET | Freq: Every day | ORAL | Status: DC
  Administered 2022-03-26 – 2022-03-28 (×3): 81 mg via ORAL
  Filled 2022-03-26 (×3): qty 1

## 2022-03-26 NOTE — Progress Notes (Signed)
Patient constantly asking for salty foods and water/juice.  Patient needs more education on heart failure, weighing herself each day and foods to avoid.  She is forgetful after you try to educate which has concerns for her ability to live alone.

## 2022-03-26 NOTE — Consult Note (Signed)
Cardiology Consultation   Patient ID: Erin Riley MRN: [id number]; DOB: 06-Sep-1950  Admit date: 03/25/2022 Date of Consult: 03/26/2022  PCP:  Charlott Rakes, [place] Prov[facility] Cardiologist:  Glenetta Hew, MD        Patient Profile:   Erin Riley is a 72 y.o. female with a hx of systolic heart failure who is being seen 03/26/2022 for the evaluation of acute on chronic systolic heart failure at the request of Dr. Grandville Silos.  History of Present Illness:   Erin Riley is a 72 year old female with history of nonischemic dilated cardiomyopathy with prior ejection fraction in the 25% range with reported improvement/normalization here with worsening shortness of breath compatible with systolic heart failure.  Has a past medical history as well of schizophrenia and chronic kidney disease stage IIIa  Last several days she has been more more short of breath, increased cough lower extremity edema perhaps orthopnea.  Despite her age of 75, she states that she had not taken many of her regular medications because she was pregnant since last year and insist she has the papers to prove it.     Past Medical History:  Diagnosis Date   Chronic back pain    Chronic kidney disease (CKD), stage II (mild)    Class I-II   Coronary artery disease 04/2009   50% stenosis in the perforator of LAD; catheterization was for an abnormal Myoview in January 2000 showing anterior and inferolateral ischemia.   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 > 3 subsequent Echos show improved/normal EF with moderate to severe concentric LVH and diastolic dysfunction with LVOT/intracavitary gradient --> 06/2016: Severe LVH.  Vigorous EF, 65-70%.?? Gr 1 DD. Mild AS.   Hyperlipidemia    Hypertension    Hypertensive hypertrophic cardiomyopathy: NYHA class II:  Echo: Severe concentric LVH with LV  OT gradient; essentially preserved EF with diastolic dysfunction 95/63/8756   Echo 06/2016: Severe Concentric LVH. Vigorous EF 65-70%. ~ Gr I DD.    Mild aortic stenosis by prior echocardiography    Echo 06/2016: Mild AS (Mean Gradient 15 mmHg); has had prior Mod-Severe MR (not seen on current echo)   PAD (peripheral artery disease) ([place]) March 2013   Lower extremity Dopplers: R. SFA 50-60%, R. PTA proximally occluded with distal reconstitution;; L. common iliac ~50%, L. SFA 50-70% stenosis, L. PTA < 50%   Schizophrenia ([place])     Past Surgical History:  Procedure Laterality Date   BUNIONECTOMY     carotid doppler  05/29/2011   left bulb/prox ICA moderate amtfibrous plaque with no evidence significant reduction.,right bulb /proximal ICA normal patency   lower extremity doppler  05/29/2011   right SFA 50% to 59% diameter reduction,right posterior tibal atreery occlusive disease,reconstituting distally, left common illiac<50%,left SFA 50 to70%,left post. tibial <50%   NM MYOCAR PERF WALL MOTION  03/2009   Persantine; EF 51%-both anterior and inferolateral ischemia   TRANSTHORACIC ECHOCARDIOGRAM  06/2016   Severe LVH.  Vigorous EF of 65-70%.  No RWMA. ~Only grade 1 diastolic dysfunction.  Mild aortic stenosis (mean gradient 15 mmHg)   TRANSTHORACIC ECHOCARDIOGRAM  07/2012   EF 50-55%; severe concentric LVH; only grade 1 diastolic dysfunction. Mild aortic sclerosis - with LVOT /intracavitary gradient of roughly 20 mmHg mean. Mild to moderately dilated LA;; previously reported MR not seen     Home Medications:  Prior to Admission medications   Medication Sig Start Date End Date Taking? Authorizing Provider  amLODipine (NORVASC) 10 MG tablet Take 1 tablet (10 mg total) by mouth daily. 06/27/21  Yes Charlynne Pander, MD  aspirin EC 81 MG EC tablet Take 1 tablet (81 mg total) by mouth daily. Swallow whole. 05/21/20  Yes Glenford Bayley, MD  ferrous sulfate 325 (65 FE) MG EC tablet Take 325 mg by  mouth daily with breakfast.   Yes [provider]  hydrALAZINE (APRESOLINE) 100 MG tablet TAKE 1 TABLET (100 MG TOTAL) BY MOUTH TWO TIMES DAILY. Patient taking differently: Take 100 mg by mouth 2 (two) times daily. 06/27/21 06/27/22 Yes Charlynne Pander, MD  Multiple Vitamins-Minerals (MULTIVITAMIN WITH MINERALS) tablet Take 1 tablet by mouth daily.   Yes [provider]  triamcinolone cream (KENALOG) 0.1 % Apply 1 application topically 2 (two) times daily. Apply to rash on left thigh Patient taking differently: Apply 1 application  topically 2 (two) times daily as needed (rash). Apply to rash on left thigh 05/06/21  Yes Marcine Matar, MD  atorvastatin (LIPITOR) 40 MG tablet Take 1 tablet (40 mg total) by mouth daily. Patient not taking: Reported on 03/26/2022 02/15/21   Hoy Register, MD  carvedilol (COREG) 25 MG tablet Take 1 tablet (25 mg total) by mouth 2 (two) times daily with a meal. Patient not taking: Reported on 03/26/2022 06/27/21   Charlynne Pander, MD  ciclopirox [facility]) 8 % solution Apply topically at bedtime. Apply over toenails. Apply daily over previous coat. After seven (7) days, remove with rubbing alcohol and repeat the cycle. Patient not taking: Reported on 03/26/2022 05/06/21   Marcine Matar, MD  furosemide (LASIX) 20 MG tablet Take 1 tablet (20 mg total) by mouth daily. Patient not taking: Reported on 03/26/2022 02/15/21   Hoy Register, MD  isosorbide mononitrate (IMDUR) 60 MG 24 hr tablet TAKE 1 TABLET (60 MG TOTAL) BY MOUTH DAILY. 02/15/21 03/25/22  Hoy Register, MD  OLANZapine zydis (ZYPREXA) 10 MG disintegrating tablet Take 1 tablet (10 mg total) by mouth in the morning. Patient not taking: Reported on 03/26/2022 05/20/20   Glenford Bayley, MD  OLANZapine zydis (ZYPREXA) 20 MG disintegrating tablet Take 1 tablet (20 mg total) by mouth at bedtime. Patient not taking: Reported on 03/26/2022 05/20/20   Glenford Bayley, MD  spironolactone  (ALDACTONE) 25 MG tablet TAKE 1/2 TABLET (12.5 MG TOTAL) BY MOUTH DAILY. 02/15/21 03/25/22  Hoy Register, MD    Inpatient Medications: Scheduled Meds:  amLODipine  5 mg Oral Daily   aspirin EC  81 mg Oral Daily   atorvastatin  40 mg Oral Daily   enoxaparin (LOVENOX) injection  50 mg Subcutaneous Q24H   furosemide  40 mg Intravenous BID   hydrALAZINE  50 mg Oral BID   isosorbide mononitrate  60 mg Oral Daily   OLANZapine zydis  10 mg Oral Daily   OLANZapine zydis  10 mg Oral QHS   spironolactone  12.5 mg Oral Daily   Continuous Infusions:  PRN Meds: acetaminophen **OR** acetaminophen, benzonatate, mouth rinse, prochlorperazine  Allergies:   No Known Allergies  Social History:   Social History   Socioeconomic History   Marital status: Single    Spouse name: Not on file   Number of children: 7   Years of education: Not on file   Highest education level: Not on file  Occupational History   Not on file  Tobacco Use   Smoking status:  Some Days    Types: Cigarettes    Last attempt to quit: 05/14/2002    Years since quitting: 19.8   Smokeless tobacco: Never  Vaping Use   Vaping Use: Never used  Substance and Sexual Activity   Alcohol use: No   Drug use: No   Sexual activity: Yes    Birth control/protection: Post-menopausal, None  Other Topics Concern   Not on file  Social History Narrative   Now single mother of 2 with one grandchild. She quit smoking roughly 5 years ago and is not so since. She has also stopped drinking alcohol. She does try get routine exercise walking at least a mile 3-4 days a week.    She lives with her 4 year old mother. 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   Social Determinants of Health   Financial Resource Strain: Not on file  Food Insecurity: No Food Insecurity (03/25/2022)   Hunger Vital Sign    Worried About Running Out of Food in the Last Year: Never true    Ran Out of Food in the Last Year: Never true  Transportation Needs: No  Transportation Needs (03/25/2022)   PRAPARE - Administrator, Civil Service (Medical): No    Lack of Transportation (Non-Medical): No  Physical Activity: Not on file  Stress: Not on file  Social Connections: Not on file  Intimate Partner Violence: Not At Risk (03/25/2022)   Humiliation, Afraid, Rape, and Kick questionnaire    Fear of Current or Ex-Partner: No    Emotionally Abused: No    Physically Abused: No    Sexually Abused: No    Family History:    Family History  Problem Relation Age of Onset   Hypertension Mother    Breast cancer Neg Hx      ROS:  Please see the history of present illness.   All other ROS reviewed and negative.     Physical Exam/Data:   Vitals:   03/26/22 0200 03/26/22 0337 03/26/22 0906 03/26/22 1300  BP:  (!) 149/99 (!) 161/101 (!) 117/53  Pulse:  78 77 81  Resp: 20 20 (!) 25 (!) 22  Temp:  97.6 F (36.4 C) 98.1 F (36.7 C) 98.4 F (36.9 C)  TempSrc:  Oral Oral Oral  SpO2:  99% 100% 100%  Weight:  104.4 kg    Height:        Intake/Output Summary (Last 24 hours) at 03/26/2022 1442 Last data filed at 03/26/2022 1300 Gross per 24 hour  Intake 840 ml  Output 2200 ml  Net -1360 ml      03/26/2022    3:37 AM 03/25/2022    7:29 PM 09/17/2021   10:13 PM  Last 3 Weights  Weight (lbs) 230 lb 2.6 oz 236 lb 15.9 oz 179 lb 14.3 oz  Weight (kg) 104.4 kg 107.5 kg 81.6 kg     Body mass index is 36.05 kg/m.  General:  Well nourished, well developed, in no acute distress HEENT: normal Neck: no JVD Vascular: No carotid bruits; Distal pulses 2+ bilaterally Cardiac:  normal S1, S2; RRR; 3/6 systolic murmur Lungs:  clear to auscultation bilaterally, no wheezing, rhonchi or rales  Abd: soft, nontender, no hepatomegaly  Ext: no edema Musculoskeletal:  No deformities, BUE and BLE strength normal and equal Skin: warm and dry  Neuro:  CNs 2-12 intact, no focal abnormalities noted Psych: Delusional at times  EKG:  The EKG was personally  reviewed and demonstrates: 03/25/2022-sinus tachycardia 103 bpm with T  wave inversion lateral precordial leads, no change from prior  Telemetry:  Telemetry was personally reviewed and demonstrates: Sinus tachycardia  Relevant CV Studies:  ECHO 03/26/22:   1. Left ventricular ejection fraction, by estimation, is 25 to 30%. The  left ventricle has severely decreased function. The left ventricle  demonstrates global hypokinesis. There is severe left ventricular  hypertrophy. Left ventricular diastolic  parameters are consistent with Grade II diastolic dysfunction  (pseudonormalization). Elevated left atrial pressure.   2. Right ventricular systolic function is mildly reduced. The right  ventricular size is normal. There is normal pulmonary artery systolic  pressure. The estimated right ventricular systolic pressure is 44.0 mmHg.   3. Left atrial size was mildly dilated.   4. The mitral valve is normal in structure. Mild mitral valve  regurgitation. No evidence of mitral stenosis.   5. The inferior vena cava is dilated in size with >50% respiratory  variability, suggesting right atrial pressure of 8 mmHg.   6. Cannot exclude a small PFO.   7. The aortic valve is tricuspid. There is severe calcifcation of the  aortic valve. Aortic valve regurgitation is mild. Severe aortic valve  stenosis. Vmax 3.8 m/s, MG 36 mmHg, AVA 0.7cm^2, DI 0.21   Laboratory Data:  High Sensitivity Troponin:   Recent Labs  Lab 03/26/22 0945  TROPONINIHS 44*     Chemistry Recent Labs  Lab 03/25/22 1246 03/26/22 0530  NA 138 138  K 3.6 3.8  CL 105 104  CO2 26 27  GLUCOSE 151* 87  BUN 21 20  CREATININE 1.07* 1.41*  CALCIUM 9.1 8.6*  GFRNONAA 56* 40*  ANIONGAP 7 7    Recent Labs  Lab 03/26/22 0530  PROT 6.9  ALBUMIN 3.2*  AST 15  ALT 13  ALKPHOS 73  BILITOT 1.0   Lipids No results for input(s): "CHOL", "TRIG", "HDL", "LABVLDL", "LDLCALC", "CHOLHDL" in the last 168 hours.  Hematology Recent  Labs  Lab 03/25/22 1246 03/26/22 0530  WBC 6.2 5.3  RBC 3.27* 3.06*  HGB 9.7* 9.0*  HCT 31.3* 28.9*  MCV 95.7 94.4  MCH 29.7 29.4  MCHC 31.0 31.1  RDW 14.6 14.7  PLT 272 265   Thyroid No results for input(s): "TSH", "FREET4" in the last 168 hours.  BNP Recent Labs  Lab 03/25/22 1356  BNP 1,356.3*    DDimer No results for input(s): "DDIMER" in the last 168 hours.   Radiology/Studies:  ECHOCARDIOGRAM COMPLETE  Result Date: 03/26/2022    ECHOCARDIOGRAM REPORT   Patient Name:   JALEEN GRUPP Tauer Date of Exam: 03/26/2022 Medical Rec #:  [id number]   Height:       67.0 in Accession #:    [id number]  Weight:       230.2 lb Date of Birth:  06-Dec-1950   BSA:          2.147 m Patient Age:    7 years    BP:           149/99 mmHg Patient Gender: F           HR:           78 bpm. Exam Location:  Inpatient Procedure: 2D Echo and Intracardiac Opacification Agent  Results communicated to Dr Grandville Silos at 13:05 on 03/26/22. Indications:     CHF  History:         Patient has prior history of Echocardiogram examinations, most  recent 05/14/2020. CHF, PAD; Risk Factors:Hypertension and                  Dyslipidemia.  Sonographer:     Cathie HoopsWendy Porter Referring Phys:  [id number] Teddy SpikeYRONE A KYLE Diagnosing Phys: Epifanio Lescheshristopher Schumann MD  Sonographer Comments: Patient is obese. Image acquisition challenging due to patient body habitus. IMPRESSIONS  1. Left ventricular ejection fraction, by estimation, is 25 to 30%. The left ventricle has severely decreased function. The left ventricle demonstrates global hypokinesis. There is severe left ventricular hypertrophy. Left ventricular diastolic parameters are consistent with Grade II diastolic dysfunction (pseudonormalization). Elevated left atrial pressure.  2. Right ventricular systolic function is mildly reduced. The right ventricular size is normal. There is normal pulmonary artery systolic pressure. The estimated right ventricular systolic pressure is 30.7 mmHg.  3.  Left atrial size was mildly dilated.  4. The mitral valve is normal in structure. Mild mitral valve regurgitation. No evidence of mitral stenosis.  5. The inferior vena cava is dilated in size with >50% respiratory variability, suggesting right atrial pressure of 8 mmHg.  6. Cannot exclude a small PFO.  7. The aortic valve is tricuspid. There is severe calcifcation of the aortic valve. Aortic valve regurgitation is mild. Severe aortic valve stenosis. Vmax 3.8 m/s, MG 36 mmHg, AVA 0.7cm^2, DI 0.21 FINDINGS  Left Ventricle: Left ventricular ejection fraction, by estimation, is 25 to 30%. The left ventricle has severely decreased function. The left ventricle demonstrates global hypokinesis. Definity contrast agent was given IV to delineate the left ventricular endocardial borders. The left ventricular internal cavity size was normal in size. There is severe left ventricular hypertrophy. Left ventricular diastolic parameters are consistent with Grade II diastolic dysfunction (pseudonormalization). Elevated left atrial pressure. Right Ventricle: The right ventricular size is normal. Right vetricular wall thickness was not well visualized. Right ventricular systolic function is mildly reduced. There is normal pulmonary artery systolic pressure. The tricuspid regurgitant velocity is 2.38 m/s, and with an assumed right atrial pressure of 8 mmHg, the estimated right ventricular systolic pressure is 30.7 mmHg. Left Atrium: Left atrial size was mildly dilated. Right Atrium: Right atrial size was normal in size. Pericardium: Trivial pericardial effusion is present. Mitral Valve: The mitral valve is normal in structure. Mild mitral valve regurgitation. No evidence of mitral valve stenosis. Tricuspid Valve: The tricuspid valve is normal in structure. Tricuspid valve regurgitation is mild. Aortic Valve: The aortic valve is tricuspid. There is severe calcifcation of the aortic valve. Aortic valve regurgitation is mild. Aortic  regurgitation PHT measures 377 msec. Severe aortic stenosis is present. Aortic valve mean gradient measures 33.2 mmHg. Aortic valve peak gradient measures 46.0 mmHg. Pulmonic Valve: The pulmonic valve was normal in structure. Pulmonic valve regurgitation is trivial. Aorta: The aortic root is normal in size and structure. Venous: The inferior vena cava is dilated in size with greater than 50% respiratory variability, suggesting right atrial pressure of 8 mmHg. IAS/Shunts: Cannot exclude a small PFO.  LEFT VENTRICLE PLAX 2D LVIDd:         5.40 cm      Diastology LVIDs:         4.80 cm      LV e' medial:    3.70 cm/s LV PW:         2.30 cm      LV E/e' medial:  22.5 LV IVS:        1.20 cm      LV e' lateral:   4.79 cm/s  LV E/e' lateral: 17.3  LV Volumes (MOD) LV vol d, MOD A2C: 213.0 ml LV vol d, MOD A4C: 176.0 ml LV vol s, MOD A2C: 165.0 ml LV vol s, MOD A4C: 140.0 ml LV SV MOD A2C:     48.0 ml LV SV MOD A4C:     176.0 ml LV SV MOD BP:      45.4 ml RIGHT VENTRICLE RV Basal diam:  4.10 cm RV Mid diam:    2.80 cm RV S prime:     9.14 cm/s TAPSE (M-mode): 1.8 cm LEFT ATRIUM              Index        RIGHT ATRIUM           Index LA diam:        4.60 cm  2.14 cm/m   RA Area:     17.60 cm LA Vol (A2C):   110.0 ml 51.24 ml/m  RA Volume:   40.90 ml  19.05 ml/m LA Vol (A4C):   48.8 ml  22.73 ml/m LA Biplane Vol: 74.1 ml  34.52 ml/m  AORTIC VALVE                    PULMONIC VALVE AV Vmax:           339.20 cm/s  PV Vmax:          0.99 m/s AV Vmean:          271.750 cm/s PV Peak grad:     3.9 mmHg AV VTI:            0.715 m      PR End Diast Vel: 9.24 msec AV Peak Grad:      46.0 mmHg AV Mean Grad:      33.2 mmHg LVOT Vmax:         75.05 cm/s LVOT Vmean:        54.500 cm/s LVOT VTI:          0.152 m LVOT/AV VTI ratio: 0.21 AI PHT:            377 msec MITRAL VALVE               TRICUSPID VALVE MV Area (PHT): 3.72 cm    TV Peak grad:   27.2 mmHg MV Decel Time: 204 msec    TV Vmax:        2.61 m/s  MR Peak grad: 130.5 mmHg   TR Peak grad:   22.7 mmHg MR Mean grad: 104.0 mmHg   TR Vmax:        238.00 cm/s MR Vmax:      571.25 cm/s MR Vmean:     484.0 cm/s   SHUNTS MV E velocity: 83.10 cm/s  Systemic VTI: 0.15 m MV A velocity: 78.80 cm/s MV E/A ratio:  1.05 Epifanio Lescheshristopher Schumann MD Electronically signed by Epifanio Lescheshristopher Schumann MD Signature Date/Time: 03/26/2022/1:09:55 PM    Final (Updated)    DG Chest 2 View  Result Date: 03/25/2022 CLINICAL DATA:  Shortness of breath and congestion. EXAM: CHEST - 2 VIEW COMPARISON:  04/13/2021 FINDINGS: Low volume film. The cardio pericardial silhouette is enlarged. Vascular congestion noted with diffuse hazy bilateral airspace opacity. No substantial pleural effusion status post right shoulder replacement. Telemetry leads overlie the chest. IMPRESSION: Low volume film with vascular congestion and diffuse hazy bilateral airspace opacity suggesting pulmonary edema. Electronically Signed   By: Kennith CenterEric  Mansell M.D.   On: 03/25/2022  13:43     Assessment and Plan:   Acute on chronic systolic heart failure, EF 25% -IV Lasix is being administered 40 mg twice daily.   -Continue with goal-directed medical therapy as appropriate. -Continue to counsel on dietary as well as fluid restrictions.  Her nurse earlier today stated that she was constantly asking for salty foods water and juice. -Clearly her underlying schizophrenia/mental illness is playing a significant role in her heart disease.  We will continue to encourage medications dietary compliance. -Continue with hydralazine 50 amlodipine 5 isosorbide for blood pressure management as well.  She has spironolactone 12.5 mg a day.  Once more euvolemic, we will add back carvedilol.  Severe aortic stenosis - Noted on echocardiogram.  Management of this will be challenging.  I am not sure that she would be a candidate for either TAVR or open repair given her underlying mental illness.  If her schizophrenia were quiescent,  theoretically 1 could consider structural heart team evaluation.  Hyperlipidemia - Continue with statin.  Schizophrenia - Per primary team, home regimen.  Spoke with primary team.  They will get psychiatry involved.  Hopefully once again if we can get her mental illness under better overall control, she would become a candidate for potential invasive therapies given her underlying severe aortic stenosis.    Risk Assessment/Risk Scores:       New York Heart Association (NYHA) Functional Class NYHA Class III        For questions or updates, please contact [facility] Please consult [email or web address] for contact info under    Signed, Donato Schultz, MD  03/26/2022 2:42 PM

## 2022-03-26 NOTE — Progress Notes (Addendum)
PROGRESS NOTE    Erin Riley  UVO:[id number] DOB: 10-Apr-1950 DOA: 03/25/2022 PCP: Hoy Register, MD    Chief Complaint  Patient presents with   Shortness of Breath   Nasal Congestion    Brief Narrative:  Patient 72 year old female history of schizophrenia, hypertension, hyperlipidemia, chronic combined CHF, CKD stage III yea, difficult historian as history was challenging presented to the ED with a 3 to 4-day history of shortness of breath, lower extremity edema, cough and mucus with congestion with some orthopnea noted.  Workup in the ED consistent with acute CHF exacerbation with elevated BNP, chest x-ray concerning for volume overload.  Patient placed on IV Lasix.  It is noted that patient has not taken her regular medications as she stated she has been pregnant for over the past year and insisted she had papers to prove that.  Patient on admission noted to be hypertensive urgency, noted to be in CHF, admitted placed on IV Lasix.  2D echo obtained on 03/26/2022 with EF of 25 to 30% with severe aortic stenosis.  Cardiology consulted.  Psychiatry also consulted to help with management of schizophrenia.   Assessment & Plan:   Principal Problem:   Acute on chronic combined systolic (congestive) and diastolic (congestive) heart failure (HCC) Active Problems:   Severe aortic stenosis   Hypertensive urgency   Essential hypertension   Paranoid schizophrenia (HCC)   CKD (chronic kidney disease) stage 3, GFR 30-59 ml/min (HCC)   Hyperlipidemia with target LDL less than 100   Acute on chronic combined systolic and diastolic CHF (congestive heart failure) (HCC)   Normocytic anemia  #1 acute on chronic combined CHF exacerbation ??  Etiology.  Per RN patient also noted with some dietary indiscretions and noncompliance with medications over the past year. -Patient presented with shortness of breath, increased cough, lower extremity edema, orthopnea. -Chest x-ray done concerning for volume  overload/vascular congestion.  BNP noted to be elevated at 1356.3. -Patient also noted to be in hypertensive urgency on admission. -Urine output noted to be 1 L since admission and patient noted to be -1.360 L during this hospitalization. -First set of troponin at 44, serial troponin pending. -Continue Lasix 40 mg IV every 12 hours. -Resume home regimen Imdur 60 mg daily, hydralazine at decreased dose of 50 mg twice daily, Norvasc at decreased dose of 5 mg daily, spironolactone 12.5 mg daily, Norvasc, aspirin, Lipitor. -Hold beta-blocker for now with acute exacerbation. -Patient supposed to be on medications but has not taken in over a year. -2D echo done with EF of 25 to 30%, severe aortic stenosis, left ventricular demonstrates global hypokinesis, severe LVH, grade 2 diastolic dysfunction.  Mildly dilated left atrial size.  Severe aortic valve stenosis. -Consult with dietitian. -Due to significantly abnormal 2D echo cardiology consulted for further evaluation and management.  2.  Severe aortic stenosis -Noted on 2D echo. -Cardiology consulted will be assessed the patient for the management will be challenging in the setting of patient schizophrenia and patient likely at this time not a candidate for either TAVR or open repair due to mental illness. -Continue current cardiac medications. -Also will consult with psychiatry to help with management of patient's schizophrenia.  3.  Hyperlipidemia -Check a fasting lipid panel. -Continue statin.  4.  Schizophrenia -Patient with severe AS, presented with acute CHF exacerbation with depressed EF of 25 to 30%.  Difficult to manage patient's severe aortic stenosis in the setting of underlying mental illness. -Resume Zyprexa which patient is supposed to be  on at 10 mg twice daily. -Consult with psychiatry for management of patient's schizophrenia and for further recommendations.  5.  Hypertensive urgency -On admission patient noted to have systolic  blood pressures in the 200s. -Patient noted not to have been on any medications over the past year however will place patient back on Imdur 60 mg daily, hydralazine at decreased dose of 50 mg twice daily, Norvasc at decreased dose of 5 mg daily, spironolactone 12.5 mg daily.  Patient on IV Lasix.  Hold Coreg due to acute CHF exacerbation.  6.  CKD stage IIIa -Stable. -Monitor with diuresis.  7.  Normocytic anemia -Hemoglobin currently at 9.0. -Patient with no overt bleeding. -Follow H&H. -Transfusion threshold hemoglobin < 8.    DVT prophylaxis: Lovenox Code Status: Full Family Communication: Updated patient.  No family at bedside. Disposition: TBD  Status is: Observation The patient remains OBS appropriate and will d/c before 2 midnights.   Consultants:  Cardiology: Dr. Anne FuSkains 03/26/2022 Psychiatry pending  Procedures:  2D echo 03/26/2022 Chest x-ray 03/25/2022  Antimicrobials:  None   Subjective: Patient sitting up in bed, eating lunch, noted to have a coughing fit at the bedside.  Denies any chest pain.  Feels shortness of breath is improving.  Stated her doctor had stopped her medications as it was felt she was doing what she was supposed to be doing.  Objective: Vitals:   03/26/22 0200 03/26/22 0337 03/26/22 0906 03/26/22 1300  BP:  (!) 149/99 (!) 161/101 (!) 117/53  Pulse:  78 77 81  Resp: 20 20 (!) 25 (!) 22  Temp:  97.6 F (36.4 C) 98.1 F (36.7 C) 98.4 F (36.9 C)  TempSrc:  Oral Oral Oral  SpO2:  99% 100% 100%  Weight:  104.4 kg    Height:        Intake/Output Summary (Last 24 hours) at 03/26/2022 1501 Last data filed at 03/26/2022 1300 Gross per 24 hour  Intake 840 ml  Output 2200 ml  Net -1360 ml   Filed Weights   03/25/22 1929 03/26/22 0337  Weight: 107.5 kg 104.4 kg    Examination:  General exam: Appears calm and comfortable  Respiratory system: Some bibasilar crackles.  No wheezing.  Fair air movement.  Speaking in full sentences.    Cardiovascular system: S1 & S2 heard, RRR. No JVD, murmurs, rubs, gallops or clicks.  Trace bilateral lower extremity edema.  Gastrointestinal system: Abdomen is nondistended, soft and nontender. No organomegaly or masses felt. Normal bowel sounds heard. Central nervous system: Alert and oriented. No focal neurological deficits. Extremities: Symmetric 5 x 5 power. Skin: No rashes, lesions or ulcers Psychiatry: Judgement and insight appear poor to fair. Mood & affect appropriate.     Data Reviewed: I have personally reviewed following labs and imaging studies  CBC: Recent Labs  Lab 03/25/22 1246 03/26/22 0530  WBC 6.2 5.3  NEUTROABS 4.9  --   HGB 9.7* 9.0*  HCT 31.3* 28.9*  MCV 95.7 94.4  PLT 272 265    Basic Metabolic Panel: Recent Labs  Lab 03/25/22 1246 03/26/22 0530  NA 138 138  K 3.6 3.8  CL 105 104  CO2 26 27  GLUCOSE 151* 87  BUN 21 20  CREATININE 1.07* 1.41*  CALCIUM 9.1 8.6*    GFR: Estimated Creatinine Clearance: 46.1 mL/min (A) (by C-G formula based on SCr of 1.41 mg/dL (H)).  Liver Function Tests: Recent Labs  Lab 03/26/22 0530  AST 15  ALT 13  ALKPHOS 73  BILITOT 1.0  PROT 6.9  ALBUMIN 3.2*    CBG: No results for input(s): "GLUCAP" in the last 168 hours.   Recent Results (from the past 240 hour(s))  Resp panel by RT-PCR (RSV, Flu A&B, Covid) Anterior Nasal Swab     Status: None   Collection Time: 03/25/22 12:31 PM   Specimen: Anterior Nasal Swab  Result Value Ref Range Status   SARS Coronavirus 2 by RT PCR NEGATIVE NEGATIVE Final    Comment: (NOTE) SARS-CoV-2 target nucleic acids are NOT DETECTED.  The SARS-CoV-2 RNA is generally detectable in upper respiratory specimens during the acute phase of infection. The lowest concentration of SARS-CoV-2 viral copies this assay can detect is 138 copies/mL. A negative result does not preclude SARS-Cov-2 infection and should not be used as the sole basis for treatment or other patient  management decisions. A negative result may occur with  improper specimen collection/handling, submission of specimen other than nasopharyngeal swab, presence of viral mutation(s) within the areas targeted by this assay, and inadequate number of viral copies(<138 copies/mL). A negative result must be combined with clinical observations, patient history, and epidemiological information. The expected result is Negative.  Fact Sheet for Patients:  EntrepreneurPulse.com.au  Fact Sheet for Healthcare Providers:  IncredibleEmployment.be  This test is no t yet approved or cleared by the [place] FDA and  has been authorized for detection and/or diagnosis of SARS-CoV-2 by FDA under an Emergency Use Authorization (EUA). This EUA will remain  in effect (meaning this test can be used) for the duration of the COVID-19 declaration under Section 564(b)(1) of the Act, 21 U.S.C.section 360bbb-3(b)(1), unless the authorization is terminated  or revoked sooner.       Influenza A by PCR NEGATIVE NEGATIVE Final   Influenza B by PCR NEGATIVE NEGATIVE Final    Comment: (NOTE) The Xpert Xpress SARS-CoV-2/FLU/RSV plus assay is intended as an aid in the diagnosis of influenza from Nasopharyngeal swab specimens and should not be used as a sole basis for treatment. Nasal washings and aspirates are unacceptable for Xpert Xpress SARS-CoV-2/FLU/RSV testing.  Fact Sheet for Patients: EntrepreneurPulse.com.au  Fact Sheet for Healthcare Providers: IncredibleEmployment.be  This test is not yet approved or cleared by the [place] FDA and has been authorized for detection and/or diagnosis of SARS-CoV-2 by FDA under an Emergency Use Authorization (EUA). This EUA will remain in effect (meaning this test can be used) for the duration of the COVID-19 declaration under Section 564(b)(1) of the Act, 21 U.S.C. section 360bbb-3(b)(1),  unless the authorization is terminated or revoked.     Resp Syncytial Virus by PCR NEGATIVE NEGATIVE Final    Comment: (NOTE) Fact Sheet for Patients: EntrepreneurPulse.com.au  Fact Sheet for Healthcare Providers: IncredibleEmployment.be  This test is not yet approved or cleared by the [place] FDA and has been authorized for detection and/or diagnosis of SARS-CoV-2 by FDA under an Emergency Use Authorization (EUA). This EUA will remain in effect (meaning this test can be used) for the duration of the COVID-19 declaration under Section 564(b)(1) of the Act, 21 U.S.C. section 360bbb-3(b)(1), unless the authorization is terminated or revoked.  Performed at [facility], [place] [address] [place], [place] [address]          Radiology Studies: ECHOCARDIOGRAM COMPLETE  Result Date: 03/26/2022    ECHOCARDIOGRAM REPORT   Patient Name:   Erin Riley Date of Exam: 03/26/2022 Medical Rec #:  [id number]   Height:       67.0  in Accession #:    [id number]  Weight:       230.2 lb Date of Birth:  05-26-1950   BSA:          2.147 m Patient Age:    32 years    BP:           149/99 mmHg Patient Gender: F           HR:           78 bpm. Exam Location:  Inpatient Procedure: 2D Echo and Intracardiac Opacification Agent  Results communicated to Dr Grandville Silos at 13:05 on 03/26/22. Indications:     CHF  History:         Patient has prior history of Echocardiogram examinations, most                  recent 05/14/2020. CHF, PAD; Risk Factors:Hypertension and                  Dyslipidemia.  Sonographer:     Harvie Junior Referring Phys:  [id number] Jonnie Finner Diagnosing Phys: Oswaldo Milian MD  Sonographer Comments: Patient is obese. Image acquisition challenging due to patient body habitus. IMPRESSIONS  1. Left ventricular ejection fraction, by estimation, is 25 to 30%. The left ventricle has severely decreased function. The left ventricle demonstrates  global hypokinesis. There is severe left ventricular hypertrophy. Left ventricular diastolic parameters are consistent with Grade II diastolic dysfunction (pseudonormalization). Elevated left atrial pressure.  2. Right ventricular systolic function is mildly reduced. The right ventricular size is normal. There is normal pulmonary artery systolic pressure. The estimated right ventricular systolic pressure is 16.0 mmHg.  3. Left atrial size was mildly dilated.  4. The mitral valve is normal in structure. Mild mitral valve regurgitation. No evidence of mitral stenosis.  5. The inferior vena cava is dilated in size with >50% respiratory variability, suggesting right atrial pressure of 8 mmHg.  6. Cannot exclude a small PFO.  7. The aortic valve is tricuspid. There is severe calcifcation of the aortic valve. Aortic valve regurgitation is mild. Severe aortic valve stenosis. Vmax 3.8 m/s, MG 36 mmHg, AVA 0.7cm^2, DI 0.21 FINDINGS  Left Ventricle: Left ventricular ejection fraction, by estimation, is 25 to 30%. The left ventricle has severely decreased function. The left ventricle demonstrates global hypokinesis. Definity contrast agent was given IV to delineate the left ventricular endocardial borders. The left ventricular internal cavity size was normal in size. There is severe left ventricular hypertrophy. Left ventricular diastolic parameters are consistent with Grade II diastolic dysfunction (pseudonormalization). Elevated left atrial pressure. Right Ventricle: The right ventricular size is normal. Right vetricular wall thickness was not well visualized. Right ventricular systolic function is mildly reduced. There is normal pulmonary artery systolic pressure. The tricuspid regurgitant velocity is 2.38 m/s, and with an assumed right atrial pressure of 8 mmHg, the estimated right ventricular systolic pressure is 10.9 mmHg. Left Atrium: Left atrial size was mildly dilated. Right Atrium: Right atrial size was normal in  size. Pericardium: Trivial pericardial effusion is present. Mitral Valve: The mitral valve is normal in structure. Mild mitral valve regurgitation. No evidence of mitral valve stenosis. Tricuspid Valve: The tricuspid valve is normal in structure. Tricuspid valve regurgitation is mild. Aortic Valve: The aortic valve is tricuspid. There is severe calcifcation of the aortic valve. Aortic valve regurgitation is mild. Aortic regurgitation PHT measures 377 msec. Severe aortic stenosis is present. Aortic valve mean gradient measures 33.2 mmHg. Aortic valve peak  gradient measures 46.0 mmHg. Pulmonic Valve: The pulmonic valve was normal in structure. Pulmonic valve regurgitation is trivial. Aorta: The aortic root is normal in size and structure. Venous: The inferior vena cava is dilated in size with greater than 50% respiratory variability, suggesting right atrial pressure of 8 mmHg. IAS/Shunts: Cannot exclude a small PFO.  LEFT VENTRICLE PLAX 2D LVIDd:         5.40 cm      Diastology LVIDs:         4.80 cm      LV e' medial:    3.70 cm/s LV PW:         2.30 cm      LV E/e' medial:  22.5 LV IVS:        1.20 cm      LV e' lateral:   4.79 cm/s                             LV E/e' lateral: 17.3  LV Volumes (MOD) LV vol d, MOD A2C: 213.0 ml LV vol d, MOD A4C: 176.0 ml LV vol s, MOD A2C: 165.0 ml LV vol s, MOD A4C: 140.0 ml LV SV MOD A2C:     48.0 ml LV SV MOD A4C:     176.0 ml LV SV MOD BP:      45.4 ml RIGHT VENTRICLE RV Basal diam:  4.10 cm RV Mid diam:    2.80 cm RV S prime:     9.14 cm/s TAPSE (M-mode): 1.8 cm LEFT ATRIUM              Index        RIGHT ATRIUM           Index LA diam:        4.60 cm  2.14 cm/m   RA Area:     17.60 cm LA Vol (A2C):   110.0 ml 51.24 ml/m  RA Volume:   40.90 ml  19.05 ml/m LA Vol (A4C):   48.8 ml  22.73 ml/m LA Biplane Vol: 74.1 ml  34.52 ml/m  AORTIC VALVE                    PULMONIC VALVE AV Vmax:           339.20 cm/s  PV Vmax:          0.99 m/s AV Vmean:          271.750 cm/s PV Peak  grad:     3.9 mmHg AV VTI:            0.715 m      PR End Diast Vel: 9.24 msec AV Peak Grad:      46.0 mmHg AV Mean Grad:      33.2 mmHg LVOT Vmax:         75.05 cm/s LVOT Vmean:        54.500 cm/s LVOT VTI:          0.152 m LVOT/AV VTI ratio: 0.21 AI PHT:            377 msec MITRAL VALVE               TRICUSPID VALVE MV Area (PHT): 3.72 cm    TV Peak grad:   27.2 mmHg MV Decel Time: 204 msec    TV Vmax:        2.61 m/s MR Peak grad: 130.5  mmHg   TR Peak grad:   22.7 mmHg MR Mean grad: 104.0 mmHg   TR Vmax:        238.00 cm/s MR Vmax:      571.25 cm/s MR Vmean:     484.0 cm/s   SHUNTS MV E velocity: 83.10 cm/s  Systemic VTI: 0.15 m MV A velocity: 78.80 cm/s MV E/A ratio:  1.05 Epifanio Lesches MD Electronically signed by Epifanio Lesches MD Signature Date/Time: 03/26/2022/1:09:55 PM    Final (Updated)    DG Chest 2 View  Result Date: 03/25/2022 CLINICAL DATA:  Shortness of breath and congestion. EXAM: CHEST - 2 VIEW COMPARISON:  04/13/2021 FINDINGS: Low volume film. The cardio pericardial silhouette is enlarged. Vascular congestion noted with diffuse hazy bilateral airspace opacity. No substantial pleural effusion status post right shoulder replacement. Telemetry leads overlie the chest. IMPRESSION: Low volume film with vascular congestion and diffuse hazy bilateral airspace opacity suggesting pulmonary edema. Electronically Signed   By: Kennith Center M.D.   On: 03/25/2022 13:43        Scheduled Meds:  amLODipine  5 mg Oral Daily   aspirin EC  81 mg Oral Daily   atorvastatin  40 mg Oral Daily   enoxaparin (LOVENOX) injection  50 mg Subcutaneous Q24H   furosemide  40 mg Intravenous BID   hydrALAZINE  50 mg Oral BID   isosorbide mononitrate  60 mg Oral Daily   OLANZapine zydis  10 mg Oral Daily   OLANZapine zydis  10 mg Oral QHS   spironolactone  12.5 mg Oral Daily   Continuous Infusions:   LOS: 0 days    Time spent: 40 minutes    Ramiro Harvest, MD [facility]   To  contact the attending provider between 7A-7P or the covering provider during after hours 7P-7A, please log into the web site [email or web address] and access using universal [place] password for that web site. If you do not have the password, please call the hospital operator.  03/26/2022, 3:01 PM

## 2022-03-26 NOTE — Progress Notes (Signed)
  Echocardiogram 2D Echocardiogram has been performed.  Erin Riley 03/26/2022, 8:58 AM

## 2022-03-26 NOTE — TOC Progression Note (Signed)
Transition of Care [facility]) - Progression Note    Patient Details  Name: Erin Riley MRN: [id number] Date of Birth: 01/22/1951  [facility] CM/SW Contact  Henrietta Dine, RN Phone Number: 03/26/2022, 3:13 PM  Clinical Narrative:    TOC order received for Heart Failure Home Health Screening. Patient has been reviewed and does not meet criteria.          Expected Discharge Plan and Services                                               Social Determinants of Health (SDOH) Interventions SDOH Screenings   Food Insecurity: No Food Insecurity (03/25/2022)  Housing: Low Risk  (03/25/2022)  Transportation Needs: No Transportation Needs (03/25/2022)  Utilities: Not At Risk (03/25/2022)  Alcohol Screen: Low Risk  (12/17/2019)  Depression (PHQ2-9): Low Risk  (01/26/2021)  Tobacco Use: High Risk (03/25/2022)    Readmission Risk Interventions     No data to display

## 2022-03-27 DIAGNOSIS — I160 Hypertensive urgency: Secondary | ICD-10-CM | POA: Diagnosis not present

## 2022-03-27 DIAGNOSIS — I5043 Acute on chronic combined systolic (congestive) and diastolic (congestive) heart failure: Secondary | ICD-10-CM | POA: Diagnosis not present

## 2022-03-27 DIAGNOSIS — N1831 Chronic kidney disease, stage 3a: Secondary | ICD-10-CM | POA: Diagnosis not present

## 2022-03-27 DIAGNOSIS — E785 Hyperlipidemia, unspecified: Secondary | ICD-10-CM | POA: Diagnosis not present

## 2022-03-27 LAB — CBC
HCT: 32.9 % — ABNORMAL LOW (ref 36.0–46.0)
Hemoglobin: 10.3 g/dL — ABNORMAL LOW (ref 12.0–15.0)
MCH: 29.5 pg (ref 26.0–34.0)
MCHC: 31.3 g/dL (ref 30.0–36.0)
MCV: 94.3 fL (ref 80.0–100.0)
Platelets: 322 10*3/uL (ref 150–400)
RBC: 3.49 MIL/uL — ABNORMAL LOW (ref 3.87–5.11)
RDW: 15 % (ref 11.5–15.5)
WBC: 6 10*3/uL (ref 4.0–10.5)
nRBC: 0 % (ref 0.0–0.2)

## 2022-03-27 LAB — BASIC METABOLIC PANEL
Anion gap: 7 (ref 5–15)
BUN: 30 mg/dL — ABNORMAL HIGH (ref 8–23)
CO2: 27 mmol/L (ref 22–32)
Calcium: 9.3 mg/dL (ref 8.9–10.3)
Chloride: 105 mmol/L (ref 98–111)
Creatinine, Ser: 1.56 mg/dL — ABNORMAL HIGH (ref 0.44–1.00)
GFR, Estimated: 36 mL/min — ABNORMAL LOW (ref >60)
Glucose, Bld: 99 mg/dL (ref 70–99)
Potassium: 3.7 mmol/L (ref 3.5–5.1)
Sodium: 139 mmol/L (ref 135–145)

## 2022-03-27 LAB — LIPID PANEL
Cholesterol: 147 mg/dL (ref 0–200)
HDL: 37 mg/dL — ABNORMAL LOW (ref >40)
LDL Cholesterol: 92 mg/dL (ref 0–99)
Total CHOL/HDL Ratio: 4 RATIO
Triglycerides: 89 mg/dL (ref <150)
VLDL: 18 mg/dL (ref 0–40)

## 2022-03-27 LAB — GLUCOSE, CAPILLARY
Glucose-Capillary: 100 mg/dL — ABNORMAL HIGH (ref 70–99)
Glucose-Capillary: 119 mg/dL — ABNORMAL HIGH (ref 70–99)

## 2022-03-27 LAB — MAGNESIUM: Magnesium: 2.7 mg/dL — ABNORMAL HIGH (ref 1.7–2.4)

## 2022-03-27 MED ORDER — OLANZAPINE 5 MG PO TBDP
5.0000 mg | ORAL_TABLET | Freq: Every day | ORAL | Status: DC
  Administered 2022-03-27: 5 mg via ORAL
  Filled 2022-03-27 (×2): qty 1

## 2022-03-27 MED ORDER — OLANZAPINE 5 MG PO TBDP: 7.5000 mg | ORAL_TABLET | Freq: Every day | ORAL | Status: DC

## 2022-03-27 MED ORDER — CARVEDILOL 3.125 MG PO TABS
3.1250 mg | ORAL_TABLET | Freq: Two times a day (BID) | ORAL | Status: DC
  Administered 2022-03-27 – 2022-03-28 (×3): 3.125 mg via ORAL
  Filled 2022-03-27 (×3): qty 1

## 2022-03-27 MED ORDER — OLANZAPINE 5 MG PO TBDP
5.0000 mg | ORAL_TABLET | Freq: Every day | ORAL | Status: DC
  Administered 2022-03-28: 5 mg via ORAL
  Filled 2022-03-27: qty 1

## 2022-03-27 MED ORDER — OLANZAPINE 5 MG PO TBDP
7.5000 mg | ORAL_TABLET | Freq: Every day | ORAL | Status: DC
  Filled 2022-03-27: qty 1.5

## 2022-03-27 NOTE — Progress Notes (Signed)
Heart Failure Navigator Progress Note  Assessed for Heart & Vascular TOC clinic readiness.  Patient does not meet criteria due to per MD note limited cognitive impairments, has a long history of non- compliance. .   Navigator will sign off at this time. Earnestine Leys, BSN, RN Heart Failure Transport planner Only

## 2022-03-27 NOTE — Discharge Instructions (Addendum)
Heart Failure Nutrition Therapy (2023)  This nutrition therapy for heart failure focuses on limiting sodium in your diet to manage your heart failure symptoms and maintaining your body weight.  You may need to limit fluid in your diet. Your registered dietitian nutritionist (RDN) will provide you with the Fluid Restriction Nutrition Therapy handout if you need to limit your fluid intake.  Sodium Salt (sodium) makes your body hold water. You may feel shortness of breath and experience swelling when your body is holding water. You can help to prevent these symptoms by eating less salt. You may need to limit the sodium you get from food and drinks to less than 2,300 milligrams per day. Read the nutrition label to find out how much sodium is in 1 serving of a food. Select foods with 140 milligrams of sodium or less per serving. Foods with more than 300 milligrams of sodium per serving may not fit into a reduced-sodium meal plan depending on your other food selections. Your RDN can help you determine which foods fit into your eating plan.   Check serving sizes. If you eat more than 1 serving, you will get more sodium than the amount listed.  Tips for Limiting Sodium in Your Diet How to limit sodium Strategies  Avoid processed foods   Choose fresh and frozen fruits and vegetables without added juices or sauces. These foods are naturally low in sodium.   Choose fresh meats. These foods are lower in sodium than processed meats, such as bacon, sausage, and hot dogs. Read the nutrition label to help you find fresh meat that is low in sodium.  Use less salt at the table and when cooking Leave the salt out of recipes for pasta, casseroles, and soups. Just 1 teaspoon of table salt has 2,300 milligrams of sodium. Ask your RDN how to cook your favorite recipes without sodium.  Understand manufacturer claims about sodium Choose food packages that include the following claims about sodium:   "Salt-free" or  "sodium-free": These foods contain less than 5 milligrams of sodium per serving. "Very-low-sodium": These foods contain less than 35 milligrams of sodium per serving. "Low-sodium": These foods contain less than 140 milligrams of sodium per serving. Use caution with foods labeled "unsalted" or "no added salt." These foods may still be high in sodium.  Add flavors to your food without adding sodium Use lemon juice, lime juice, fruit juice, or vinegar. Add flavor with dry or fresh herbs such as basil, bay leaf, dill, rosemary, parsley, sage, dry mustard, nutmeg, thyme, and paprika. Add spice using black pepper, red pepper flakes, and cayenne pepper. Hot sauce contains sodium but using just a drop or two will not add much sodium to your foods. Use a homemade or store-bought sodium-free seasoning blend.  Choose foods carefully when you eat outside your home Seek out nutrition information. Restaurant foods can be very high in sodium. Many restaurants provide nutrition facts on their menus or websites or upon request. Let your server know that you want your food to be cooked without salt. Ask for your salad dressing and sauces to come on the side.   Guidance for Weight Monitoring Your RDN can help you determine your recommended weight. Weigh yourself each day in the morning. Unexpected weight gain in a short period of time may be a sign that fluid is building up in your body. Contact your health care provider if you gain 3 or more pounds in 1-2 days or 5 or more pounds within 1  week. Your health care provider may adjust your medicine to help manage fluid buildup. The following tips may help if you can't eat enough, have lost weight, or need extra calories and protein in your diet: Plan to eat 3 meals and 3 snacks daily. Several small meals and snacks are often better tolerated and digested than large meals. Keep high-calorie and high-protein snacks available for when you get hungry. Add calories and  protein to your meals and snacks. Add olive oil, other vegetable oils, butter, or margarine to soups, vegetables, potatoes, cooked cereal, rice, pasta, bread, crackers, pancakes, or waffles. Drink milk, chocolate milk, soy milk, or smoothies instead of low-calorie beverages such as diet drinks or water. Add milk powder to protein shakes, cereal, or casseroles. Add peanut butter or nut butters, mayonnaise, or honey to foods. Drink high-calorie beverages such as oral nutrition supplements or milkshakes.  Your RDN can provide you with the [facility] High-Calorie, High-Protein Nutrition Therapy handout for more tips on increasing calories and protein in your diet.    Foods Recommended and Not Recommended Food Group Foods Recommended Foods Not Recommended  Grains Breads with less than 80 mg sodium per slice Homemade bread made with reduced sodium baking soda Cereal such as shredded wheat and puffed rice Oats, grits, cream of wheat Pasta, noodles, quinoa, rice Unsalted popcorn, pretzels, and crackers Breads or crackers topped with salt Cereals (hot/cold) with more than 300 milligrams sodium per serving Biscuits, cornbread, and other "quick" breads prepared with baking soda Prepackaged bread crumbs Pre-seasoned rice and noodles Self-rising flours Salted pretzels and popcorn  Protein Foods Fresh meats, poultry, and fish Eggs or egg beaters (if less than 200 mg per serving)  [place] bacon (if not packaged in a sodium solution) Canned or packed tuna (no more than 4 ounces at 1 serving) Unsalted beans, lentils, or peas Soy foods such as tofu, tempeh, or unsalted soy nuts Unsalted nuts or nut butter Cured meats: bacon, ham, sausage, pepperoni, salt pork, and hot dogs Canned meats: chili, [place] sausage, sardines, and ham Smoked fish and meats  Dairy and Dairy Alternatives Milk or milk powder Fortified soy milk Yogurt, including [place] and soy yogurt Small amounts of natural reduced-sodium cheese  (Swiss, ricotta, and fresh mozzarella are lower in sodium than others) Cream cheese Low-sodium cottage cheese Buttermilk Processed cheeses  Cottage cheese (unless a low-sodium variety) Feta cheese; shredded cheese (has more sodium than block cheese); singles slices and string cheese  Vegetables Fresh and frozen vegetables without added sauces, salt, or sodium Low-sodium or sodium-free canned vegetables Canned vegetables that are high in sodium Frozen vegetables with seasoning and sauces Sauerkraut, pickles, and other pickled vegetables such as kimchi [place] fries and onion rings that contain sodium  Fruit Fresh, canned, or dried fruits   Dried fruits preserved with sodium-containing additives  Fats and Oils Unsaturated vegetable oils, including olive, peanut, and canola oils Vegetable oil-based margarines and spreads Low sodium salad dressing and mayonnaise made from unsaturated vegetable oils Simple salad dressings (vinegar and oil) Unsalted butter and margarine Salted butter or margarine Olives    Condiments Fresh or dried herbs Low-sodium ketchup Vinegar Lemon or lime juice Pepper Salt-free seasoning mixes and marinades (salt-free seasoning blend) Salt-free sauces Salt, sea salt, kosher salt Onion and/or garlic salt Seasoning mixes containing salt (lemon pepper or bouillon cubes) Catsup or ketchup BBQ sauce Worcestershire and soy sauce Salsa Pickles Relish Salad dressings: ranch, blue cheese, [place], and [place]   Other Homemade soups (salt free or low sodium)  Frozen meals that have less than 600 milligrams sodium Unsalted chips Sodium-free bouillon cubes Canned or dried soups (that are not salt free or low sodium) Frozen meals that have more than 600 milligrams sodium Regular potato chips and other salty snack foods Regular bouillon cubes     Your olanzapine dose has been reduced due to worsening cardiac conditions. Please decrease dose to Olanzapine 5mg  po BID for  management of your schizophrenia and agitation.

## 2022-03-27 NOTE — Progress Notes (Signed)
PROGRESS NOTE    Erin Riley  ZSW:[id number] DOB: 1950-07-03 DOA: 03/25/2022 PCP: Hoy Register, MD    Chief Complaint  Patient presents with   Shortness of Breath   Nasal Congestion    Brief Narrative:  Patient 72 year old female history of schizophrenia, hypertension, hyperlipidemia, chronic combined CHF, CKD stage III yea, difficult historian as history was challenging presented to the ED with a 3 to 4-day history of shortness of breath, lower extremity edema, cough and mucus with congestion with some orthopnea noted.  Workup in the ED consistent with acute CHF exacerbation with elevated BNP, chest x-ray concerning for volume overload.  Patient placed on IV Lasix.  It is noted that patient has not taken her regular medications as she stated she has been pregnant for over the past year and insisted she had papers to prove that.  Patient on admission noted to be hypertensive urgency, noted to be in CHF, admitted placed on IV Lasix.  2D echo obtained on 03/26/2022 with EF of 25 to 30% with severe aortic stenosis.  Cardiology consulted.  Psychiatry also consulted to help with management of schizophrenia.   Assessment & Plan:   Principal Problem:   Acute on chronic combined systolic (congestive) and diastolic (congestive) heart failure (HCC) Active Problems:   Severe aortic stenosis   Hypertensive urgency   Essential hypertension   Paranoid schizophrenia (HCC)   CKD (chronic kidney disease) stage 3, GFR 30-59 ml/min (HCC)   Hyperlipidemia with target LDL less than 100   Acute on chronic combined systolic and diastolic CHF (congestive heart failure) (HCC)   Normocytic anemia  #1 acute on chronic combined CHF exacerbation ??  Etiology.  Per RN patient also noted with some dietary indiscretions and noncompliance with medications over the past year. -Patient presented with shortness of breath, increased cough, lower extremity edema, orthopnea. -Chest x-ray done concerning for volume  overload/vascular congestion.  BNP noted to be elevated at 1356.3. -Patient also noted to be in hypertensive urgency on admission. -Urine output noted to be 1.9 L over the past 24 hours.   -First set of troponin at 44, serial troponin pending. -Patient improved clinically. -Bump in creatinine as such diuretics of Lasix discontinued/held per cardiology. -Continue Imdur 60 mg daily, hydralazine 50 mg twice daily, aspirin, Lipitor. -Norvasc discontinued per cardiology and patient started on low-dose Coreg. -Patient supposed to be on medications but has not taken in over a year. -2D echo done with EF of 25 to 30%, severe aortic stenosis, left ventricular demonstrates global hypokinesis, severe LVH, grade 2 diastolic dysfunction.  Mildly dilated left atrial size.  Severe aortic valve stenosis. -Dietitian consulted.  -Due to significantly abnormal 2D echo cardiology consulted for further evaluation and management and following.  2.  Severe aortic stenosis -Noted on 2D echo. -Cardiology consulted will be assessed the patient for the management will be challenging in the setting of patient schizophrenia and patient likely at this time not a candidate for either TAVR or open repair due to mental illness. -Continue current cardiac medications. -Psychiatry also consulted.    3.  Hyperlipidemia -Fasting lipid panel with an LDL of 92.  -Continue statin.  4.  Schizophrenia -Patient with severe AS, presented with acute CHF exacerbation with depressed EF of 25 to 30%.  Difficult to manage patient's severe aortic stenosis in the setting of underlying mental illness. -Zyprexa resumed at 10 mg twice daily.   -Psychiatry consulted, psychiatry to reassess patient again tomorrow after Zyprexa dose has been reduced to  5 mg twice daily per psychiatry.   5.  Hypertensive urgency -On admission patient noted to have systolic blood pressures in the 200s. -Patient noted not to have been on any medications over the  past year however patient started on Imdur 60 mg daily, hydralazine 50 mg twice daily, Norvasc 5 mg daily, spironolactone 12.5 mg daily patient on IV Lasix.   -Patient seen by cardiology Coreg resumed at a lower dose of 3.125 mg twice daily.  Norvasc discontinued.   6.  AKI on CKD stage IIIa -Creatinine bumped up to 1.56.   -Patient close to euvolemia.   -Diuretics held today per cardiology.   7.  Normocytic anemia -Hemoglobin at 10.3. -Patient with no overt bleeding. -Follow H&H. -Transfusion threshold hemoglobin < 8.    DVT prophylaxis: Lovenox Code Status: Full Family Communication: Updated patient.  No family at bedside. Disposition: TBD  Status is: Observation The patient remains OBS appropriate and will d/c before 2 midnights.   Consultants:  Cardiology: Dr. Marlou Porch 03/26/2022 Psychiatry  Procedures:  2D echo 03/26/2022 Chest x-ray 03/25/2022  Antimicrobials:  None   Subjective: Patient sitting up in bed.  No chest pain.  No shortness of breath.  No abdominal pain.   Objective: Vitals:   03/26/22 2135 03/27/22 0502 03/27/22 1427 03/27/22 2002  BP: 137/89 129/67 (!) 104/57 119/89  Pulse: 81  81 90  Resp: (!) 22 20 18 19   Temp: 97.9 F (36.6 C) 98.3 F (36.8 C) (!) 97.5 F (36.4 C) 97.6 F (36.4 C)  TempSrc: Oral Oral Oral Oral  SpO2: 97% 98% 98% 99%  Weight:  105.1 kg    Height:        Intake/Output Summary (Last 24 hours) at 03/27/2022 2109 Last data filed at 03/27/2022 0100 Gross per 24 hour  Intake 120 ml  Output 700 ml  Net -580 ml    Filed Weights   03/25/22 1929 03/26/22 0337 03/27/22 0502  Weight: 107.5 kg 104.4 kg 105.1 kg    Examination:  General exam: NAD. Respiratory system: Lungs clear to auscultation bilaterally. No wheezes, no crackles, no rhonchi.  Fair air movement.  Speaking in full sentences. Cardiovascular system: Regular rate and rhythm with 3/6 SEM.  No JVD, no murmurs rubs or gallops.  No lower extremity edema.   Gastrointestinal system: Abdomen is soft, nontender, nondistended, positive bowel sounds.  No rebound.  No guarding.  Central nervous system: Alert and oriented. No focal neurological deficits. Extremities: Symmetric 5 x 5 power. Skin: No rashes, lesions or ulcers Psychiatry: Judgement and insight appear poor to fair. Mood & affect appropriate.     Data Reviewed: I have personally reviewed following labs and imaging studies  CBC: Recent Labs  Lab 03/25/22 1246 03/26/22 0530 03/27/22 0442  WBC 6.2 5.3 6.0  NEUTROABS 4.9  --   --   HGB 9.7* 9.0* 10.3*  HCT 31.3* 28.9* 32.9*  MCV 95.7 94.4 94.3  PLT 272 265 322     Basic Metabolic Panel: Recent Labs  Lab 03/25/22 1246 03/26/22 0530 03/27/22 0442  NA 138 138 139  K 3.6 3.8 3.7  CL 105 104 105  CO2 26 27 27   GLUCOSE 151* 87 99  BUN 21 20 30*  CREATININE 1.07* 1.41* 1.56*  CALCIUM 9.1 8.6* 9.3  MG  --   --  2.7*     GFR: Estimated Creatinine Clearance: 41.8 mL/min (A) (by C-G formula based on SCr of 1.56 mg/dL (H)).  Liver Function Tests: Recent Labs  Lab 03/26/22 0530  AST 15  ALT 13  ALKPHOS 73  BILITOT 1.0  PROT 6.9  ALBUMIN 3.2*     CBG: Recent Labs  Lab 03/26/22 2128 03/27/22 0810 03/27/22 1155  GLUCAP 115* 100* 119*     Recent Results (from the past 240 hour(s))  Resp panel by RT-PCR (RSV, Flu A&B, Covid) Anterior Nasal Swab     Status: None   Collection Time: 03/25/22 12:31 PM   Specimen: Anterior Nasal Swab  Result Value Ref Range Status   SARS Coronavirus 2 by RT PCR NEGATIVE NEGATIVE Final    Comment: (NOTE) SARS-CoV-2 target nucleic acids are NOT DETECTED.  The SARS-CoV-2 RNA is generally detectable in upper respiratory specimens during the acute phase of infection. The lowest concentration of SARS-CoV-2 viral copies this assay can detect is 138 copies/mL. A negative result does not preclude SARS-Cov-2 infection and should not be used as the sole basis for treatment or other  patient management decisions. A negative result may occur with  improper specimen collection/handling, submission of specimen other than nasopharyngeal swab, presence of viral mutation(s) within the areas targeted by this assay, and inadequate number of viral copies(<138 copies/mL). A negative result must be combined with clinical observations, patient history, and epidemiological information. The expected result is Negative.  Fact Sheet for Patients:  EntrepreneurPulse.com.au  Fact Sheet for Healthcare Providers:  IncredibleEmployment.be  This test is no t yet approved or cleared by the [place] FDA and  has been authorized for detection and/or diagnosis of SARS-CoV-2 by FDA under an Emergency Use Authorization (EUA). This EUA will remain  in effect (meaning this test can be used) for the duration of the COVID-19 declaration under Section 564(b)(1) of the Act, 21 U.S.C.section 360bbb-3(b)(1), unless the authorization is terminated  or revoked sooner.       Influenza A by PCR NEGATIVE NEGATIVE Final   Influenza B by PCR NEGATIVE NEGATIVE Final    Comment: (NOTE) The Xpert Xpress SARS-CoV-2/FLU/RSV plus assay is intended as an aid in the diagnosis of influenza from Nasopharyngeal swab specimens and should not be used as a sole basis for treatment. Nasal washings and aspirates are unacceptable for Xpert Xpress SARS-CoV-2/FLU/RSV testing.  Fact Sheet for Patients: EntrepreneurPulse.com.au  Fact Sheet for Healthcare Providers: IncredibleEmployment.be  This test is not yet approved or cleared by the [place] FDA and has been authorized for detection and/or diagnosis of SARS-CoV-2 by FDA under an Emergency Use Authorization (EUA). This EUA will remain in effect (meaning this test can be used) for the duration of the COVID-19 declaration under Section 564(b)(1) of the Act, 21 U.S.C. section  360bbb-3(b)(1), unless the authorization is terminated or revoked.     Resp Syncytial Virus by PCR NEGATIVE NEGATIVE Final    Comment: (NOTE) Fact Sheet for Patients: EntrepreneurPulse.com.au  Fact Sheet for Healthcare Providers: IncredibleEmployment.be  This test is not yet approved or cleared by the [place] FDA and has been authorized for detection and/or diagnosis of SARS-CoV-2 by FDA under an Emergency Use Authorization (EUA). This EUA will remain in effect (meaning this test can be used) for the duration of the COVID-19 declaration under Section 564(b)(1) of the Act, 21 U.S.C. section 360bbb-3(b)(1), unless the authorization is terminated or revoked.  Performed at [facility], [place] [address] [place], [place] [address]          Radiology Studies: ECHOCARDIOGRAM COMPLETE  Result Date: 03/26/2022    ECHOCARDIOGRAM REPORT   Patient Name:   Stassi  Joya Gaskins Date of Exam: 03/26/2022 Medical Rec #:  [id number]   Height:       67.0 in Accession #:    [id number]  Weight:       230.2 lb Date of Birth:  21-Oct-1950   BSA:          2.147 m Patient Age:    2 years    BP:           149/99 mmHg Patient Gender: F           HR:           78 bpm. Exam Location:  Inpatient Procedure: 2D Echo and Intracardiac Opacification Agent  Results communicated to Dr Grandville Silos at 13:05 on 03/26/22. Indications:     CHF  History:         Patient has prior history of Echocardiogram examinations, most                  recent 05/14/2020. CHF, PAD; Risk Factors:Hypertension and                  Dyslipidemia.  Sonographer:     Harvie Junior Referring Phys:  [id number] Jonnie Finner Diagnosing Phys: Oswaldo Milian MD  Sonographer Comments: Patient is obese. Image acquisition challenging due to patient body habitus. IMPRESSIONS  1. Left ventricular ejection fraction, by estimation, is 25 to 30%. The left ventricle has severely decreased function. The left ventricle  demonstrates global hypokinesis. There is severe left ventricular hypertrophy. Left ventricular diastolic parameters are consistent with Grade II diastolic dysfunction (pseudonormalization). Elevated left atrial pressure.  2. Right ventricular systolic function is mildly reduced. The right ventricular size is normal. There is normal pulmonary artery systolic pressure. The estimated right ventricular systolic pressure is 10.9 mmHg.  3. Left atrial size was mildly dilated.  4. The mitral valve is normal in structure. Mild mitral valve regurgitation. No evidence of mitral stenosis.  5. The inferior vena cava is dilated in size with >50% respiratory variability, suggesting right atrial pressure of 8 mmHg.  6. Cannot exclude a small PFO.  7. The aortic valve is tricuspid. There is severe calcifcation of the aortic valve. Aortic valve regurgitation is mild. Severe aortic valve stenosis. Vmax 3.8 m/s, MG 36 mmHg, AVA 0.7cm^2, DI 0.21 FINDINGS  Left Ventricle: Left ventricular ejection fraction, by estimation, is 25 to 30%. The left ventricle has severely decreased function. The left ventricle demonstrates global hypokinesis. Definity contrast agent was given IV to delineate the left ventricular endocardial borders. The left ventricular internal cavity size was normal in size. There is severe left ventricular hypertrophy. Left ventricular diastolic parameters are consistent with Grade II diastolic dysfunction (pseudonormalization). Elevated left atrial pressure. Right Ventricle: The right ventricular size is normal. Right vetricular wall thickness was not well visualized. Right ventricular systolic function is mildly reduced. There is normal pulmonary artery systolic pressure. The tricuspid regurgitant velocity is 2.38 m/s, and with an assumed right atrial pressure of 8 mmHg, the estimated right ventricular systolic pressure is 32.3 mmHg. Left Atrium: Left atrial size was mildly dilated. Right Atrium: Right atrial size was  normal in size. Pericardium: Trivial pericardial effusion is present. Mitral Valve: The mitral valve is normal in structure. Mild mitral valve regurgitation. No evidence of mitral valve stenosis. Tricuspid Valve: The tricuspid valve is normal in structure. Tricuspid valve regurgitation is mild. Aortic Valve: The aortic valve is tricuspid. There is severe calcifcation of the aortic valve. Aortic valve regurgitation is mild.  Aortic regurgitation PHT measures 377 msec. Severe aortic stenosis is present. Aortic valve mean gradient measures 33.2 mmHg. Aortic valve peak gradient measures 46.0 mmHg. Pulmonic Valve: The pulmonic valve was normal in structure. Pulmonic valve regurgitation is trivial. Aorta: The aortic root is normal in size and structure. Venous: The inferior vena cava is dilated in size with greater than 50% respiratory variability, suggesting right atrial pressure of 8 mmHg. IAS/Shunts: Cannot exclude a small PFO.  LEFT VENTRICLE PLAX 2D LVIDd:         5.40 cm      Diastology LVIDs:         4.80 cm      LV e' medial:    3.70 cm/s LV PW:         2.30 cm      LV E/e' medial:  22.5 LV IVS:        1.20 cm      LV e' lateral:   4.79 cm/s                             LV E/e' lateral: 17.3  LV Volumes (MOD) LV vol d, MOD A2C: 213.0 ml LV vol d, MOD A4C: 176.0 ml LV vol s, MOD A2C: 165.0 ml LV vol s, MOD A4C: 140.0 ml LV SV MOD A2C:     48.0 ml LV SV MOD A4C:     176.0 ml LV SV MOD BP:      45.4 ml RIGHT VENTRICLE RV Basal diam:  4.10 cm RV Mid diam:    2.80 cm RV S prime:     9.14 cm/s TAPSE (M-mode): 1.8 cm LEFT ATRIUM              Index        RIGHT ATRIUM           Index LA diam:        4.60 cm  2.14 cm/m   RA Area:     17.60 cm LA Vol (A2C):   110.0 ml 51.24 ml/m  RA Volume:   40.90 ml  19.05 ml/m LA Vol (A4C):   48.8 ml  22.73 ml/m LA Biplane Vol: 74.1 ml  34.52 ml/m  AORTIC VALVE                    PULMONIC VALVE AV Vmax:           339.20 cm/s  PV Vmax:          0.99 m/s AV Vmean:          271.750 cm/s  PV Peak grad:     3.9 mmHg AV VTI:            0.715 m      PR End Diast Vel: 9.24 msec AV Peak Grad:      46.0 mmHg AV Mean Grad:      33.2 mmHg LVOT Vmax:         75.05 cm/s LVOT Vmean:        54.500 cm/s LVOT VTI:          0.152 m LVOT/AV VTI ratio: 0.21 AI PHT:            377 msec MITRAL VALVE               TRICUSPID VALVE MV Area (PHT): 3.72 cm    TV Peak grad:   27.2 mmHg MV Decel  Time: 204 msec    TV Vmax:        2.61 m/s MR Peak grad: 130.5 mmHg   TR Peak grad:   22.7 mmHg MR Mean grad: 104.0 mmHg   TR Vmax:        238.00 cm/s MR Vmax:      571.25 cm/s MR Vmean:     484.0 cm/s   SHUNTS MV E velocity: 83.10 cm/s  Systemic VTI: 0.15 m MV A velocity: 78.80 cm/s MV E/A ratio:  1.05 Oswaldo Milian MD Electronically signed by Oswaldo Milian MD Signature Date/Time: 03/26/2022/1:09:55 PM    Final (Updated)         Scheduled Meds:  aspirin EC  81 mg Oral Daily   atorvastatin  40 mg Oral Daily   carvedilol  3.125 mg Oral BID WC   enoxaparin (LOVENOX) injection  50 mg Subcutaneous Q24H   hydrALAZINE  50 mg Oral BID   isosorbide mononitrate  60 mg Oral Daily   [START ON 03/28/2022] OLANZapine zydis  5 mg Oral Daily   OLANZapine zydis  5 mg Oral QHS   Continuous Infusions:   LOS: 1 day    Time spent: 40 minutes    Irine Seal, MD [facility]   To contact the attending provider between 7A-7P or the covering provider during after hours 7P-7A, please log into the web site [email or web address] and access using universal [place] password for that web site. If you do not have the password, please call the hospital operator.  03/27/2022, 9:09 PM

## 2022-03-27 NOTE — Evaluation (Signed)
Clinical/Bedside Swallow Evaluation Patient Details  Name: Erin Riley MRN: [id number] Date of Birth: 03-02-1951  Today's Date: 03/27/2022 Time: SLP Start Time (ACUTE ONLY): 1015 SLP Stop Time (ACUTE ONLY): 1030 SLP Time Calculation (min) (ACUTE ONLY): 15 min  Past Medical History:  Past Medical History:  Diagnosis Date   Chronic back pain    Chronic kidney disease (CKD), stage II (mild)    Class I-II   Coronary artery disease 04/2009   50% stenosis in the perforator of LAD; catheterization was for an abnormal Myoview in January 2000 showing anterior and inferolateral ischemia.   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 > 3 subsequent Echos show improved/normal EF with moderate to severe concentric LVH and diastolic dysfunction with LVOT/intracavitary gradient --> 06/2016: Severe LVH.  Vigorous EF, 65-70%.?? Gr 1 DD. Mild AS.   Hyperlipidemia    Hypertension    Hypertensive hypertrophic cardiomyopathy: NYHA class II:  Echo: Severe concentric LVH with LV OT gradient; essentially preserved EF with diastolic dysfunction 81/82/9937   Echo 06/2016: Severe Concentric LVH. Vigorous EF 65-70%. ~ Gr I DD.    Mild aortic stenosis by prior echocardiography    Echo 06/2016: Mild AS (Mean Gradient 15 mmHg); has had prior Mod-Severe MR (not seen on current echo)   PAD (peripheral artery disease) ([place]) March 2013   Lower extremity Dopplers: R. SFA 50-60%, R. PTA proximally occluded with distal reconstitution;; L. common iliac ~50%, L. SFA 50-70% stenosis, L. PTA < 50%   Schizophrenia ([place])    Past Surgical History:  Past Surgical History:  Procedure Laterality Date   BUNIONECTOMY     carotid doppler  05/29/2011   left bulb/prox ICA moderate amtfibrous plaque with no evidence significant reduction.,right bulb /proximal ICA normal patency   lower extremity doppler  05/29/2011   right SFA  50% to 59% diameter reduction,right posterior tibal atreery occlusive disease,reconstituting distally, left common illiac<50%,left SFA 50 to70%,left post. tibial <50%   NM MYOCAR PERF WALL MOTION  03/2009   Persantine; EF 51%-both anterior and inferolateral ischemia   TRANSTHORACIC ECHOCARDIOGRAM  06/2016   Severe LVH.  Vigorous EF of 65-70%.  No RWMA. ~Only grade 1 diastolic dysfunction.  Mild aortic stenosis (mean gradient 15 mmHg)   TRANSTHORACIC ECHOCARDIOGRAM  07/2012   EF 50-55%; severe concentric LVH; only grade 1 diastolic dysfunction. Mild aortic sclerosis - with LVOT /intracavitary gradient of roughly 20 mmHg mean. Mild to moderately dilated LA;; previously reported MR not seen   HPI:  Patient is a 72 y.o. female with PMH: schizophrenia, HTN, HLD, CKD stage 3a, CAD, PAD, chronic back pain. She presented to the hospital on 03/25/22 with SOB, some swelling in legs, several days of increased cough and mucous. She reported she has not been taking her regular medications because she was pregnant since last year. At time of hospital admission she was noted bo be hypertensive urgency, in CHF and was placed on IV Lasix. 2D echo showed EF of 25-30% with severe aortic stenosis. CXR showed low volume film with vascular congestion and diffuse hazy bilateral airspace opacity suggesting pulmonary edema.    Assessment / Plan / Recommendation  Clinical Impression  Patient is not currently presenting with clinical s/s of dysphagia as per this bedside swallow evaluation. She denies any h/o or current dysphagia but does still endorse issues with mucous expectoration (reports it has improved). Swallow initiation was timely  and no overt s/s aspiration or penetration with sequential straw sips of thin liquids (water). Patient politely declined any other PO's. SLP not recommending further skilled intervention. SLP Visit Diagnosis: Dysphagia, unspecified (R13.10)    Aspiration Risk  No limitations    Diet  Recommendation Regular;Thin liquid   Liquid Administration via: Cup;Straw Medication Administration: Whole meds with liquid Supervision: Patient able to self feed    Other  Recommendations Oral Care Recommendations: Patient independent with oral care;Oral care BID    Recommendations for follow up therapy are one component of a multi-disciplinary discharge planning process, led by the attending physician.  Recommendations may be updated based on patient status, additional functional criteria and insurance authorization.  Follow up Recommendations No SLP follow up      Assistance Recommended at Discharge    Functional Status Assessment Patient has not had a recent decline in their functional status  Frequency and Duration   N/A         Prognosis   N/A     Swallow Study   General Date of Onset: 03/25/22 HPI: Patient is a 72 y.o. female with PMH: schizophrenia, HTN, HLD, CKD stage 3a, CAD, PAD, chronic back pain. She presented to the hospital on 03/25/22 with SOB, some swelling in legs, several days of increased cough and mucous. She reported she has not been taking her regular medications because she was pregnant since last year. At time of hospital admission she was noted bo be hypertensive urgency, in CHF and was placed on IV Lasix. 2D echo showed EF of 25-30% with severe aortic stenosis. CXR showed low volume film with vascular congestion and diffuse hazy bilateral airspace opacity suggesting pulmonary edema. Type of Study: Bedside Swallow Evaluation Previous Swallow Assessment: none found Diet Prior to this Study: Regular;Thin liquids Temperature Spikes Noted: No Respiratory Status: Room air History of Recent Intubation: No Behavior/Cognition: Alert;Cooperative;Pleasant mood Oral Cavity Assessment: Within Functional Limits Oral Care Completed by SLP: No Vision: Functional for self-feeding Self-Feeding Abilities: Able to feed self Patient Positioning: Upright in bed Baseline  Vocal Quality: Normal Volitional Cough: Other (Comment) (did not demonstrate) Volitional Swallow: Able to elicit    Oral/Motor/Sensory Function Overall Oral Motor/Sensory Function: Within functional limits   Ice Chips     Thin Liquid Thin Liquid: Within functional limits Presentation: Straw;Self Fed    Nectar Thick     Honey Thick     Puree Puree: Not tested   Solid     Solid: Not tested      Sonia Baller, MA, CCC-SLP Speech Therapy

## 2022-03-27 NOTE — Progress Notes (Addendum)
Rounding Note    Patient Name: Erin Riley Date of Encounter: 03/27/2022  [facility] Cardiologist: Bryan Lemma, MD   Subjective   History is limited by cognitive impairment. She does not re-call having any leg swelling. She states her SOB is gone. She would not answer question about her living situation. She is allowing physical exam. She states she did not ask for a green banana.   Inpatient Medications    Scheduled Meds:  aspirin EC  81 mg Oral Daily   atorvastatin  40 mg Oral Daily   carvedilol  3.125 mg Oral BID WC   enoxaparin (LOVENOX) injection  50 mg Subcutaneous Q24H   hydrALAZINE  50 mg Oral BID   isosorbide mononitrate  60 mg Oral Daily   OLANZapine zydis  10 mg Oral Daily   OLANZapine zydis  10 mg Oral QHS   Continuous Infusions:  PRN Meds: acetaminophen **OR** acetaminophen, benzonatate, mouth rinse, prochlorperazine   Vital Signs    Vitals:   03/26/22 1300 03/26/22 1725 03/26/22 2135 03/27/22 0502  BP: (!) 117/53 (!) 140/92 137/89 129/67  Pulse: 81 78 81   Resp: (!) 22 (!) 24 (!) 22 20  Temp: 98.4 F (36.9 C) 97.6 F (36.4 C) 97.9 F (36.6 C) 98.3 F (36.8 C)  TempSrc: Oral Oral Oral Oral  SpO2: 100% 100% 97% 98%  Weight:    105.1 kg  Height:        Intake/Output Summary (Last 24 hours) at 03/27/2022 0835 Last data filed at 03/27/2022 0100 Gross per 24 hour  Intake 480 ml  Output 1600 ml  Net -1120 ml      03/27/2022    5:02 AM 03/26/2022    3:37 AM 03/25/2022    7:29 PM  Last 3 Weights  Weight (lbs) 231 lb 11.3 oz 230 lb 2.6 oz 236 lb 15.9 oz  Weight (kg) 105.1 kg 104.4 kg 107.5 kg      Telemetry    Sinus rhythm, occasional PVCs, 2-3 seconds of atrial run noted  - Personally Reviewed  ECG    No new tracing today  - Personally Reviewed  Physical Exam   GEN: No acute distress.  Oral cavity dry  Neck: No JVD Cardiac: RRR, systolic murmur grade III at RUSB  Respiratory: Clear to auscultation bilaterally. On room air   GI: Soft, nontender MS: No leg edema Neuro:  Cognitive impaired   Psych: Limited historian due to poor insight, calm   Labs    High Sensitivity Troponin:   Recent Labs  Lab 03/26/22 0945  TROPONINIHS 44*     Chemistry Recent Labs  Lab 03/25/22 1246 03/26/22 0530 03/27/22 0442  NA 138 138 139  K 3.6 3.8 3.7  CL 105 104 105  CO2 26 27 27   GLUCOSE 151* 87 99  BUN 21 20 30*  CREATININE 1.07* 1.41* 1.56*  CALCIUM 9.1 8.6* 9.3  MG  --   --  2.7*  PROT  --  6.9  --   ALBUMIN  --  3.2*  --   AST  --  15  --   ALT  --  13  --   ALKPHOS  --  73  --   BILITOT  --  1.0  --   GFRNONAA 56* 40* 36*  ANIONGAP 7 7 7     Lipids  Recent Labs  Lab 03/27/22 0442  CHOL 147  TRIG 89  HDL 37*  LDLCALC 92  CHOLHDL 4.0  Hematology Recent Labs  Lab 03/25/22 1246 03/26/22 0530 03/27/22 0442  WBC 6.2 5.3 6.0  RBC 3.27* 3.06* 3.49*  HGB 9.7* 9.0* 10.3*  HCT 31.3* 28.9* 32.9*  MCV 95.7 94.4 94.3  MCH 29.7 29.4 29.5  MCHC 31.0 31.1 31.3  RDW 14.6 14.7 15.0  PLT 272 265 322   Thyroid No results for input(s): "TSH", "FREET4" in the last 168 hours.  BNP Recent Labs  Lab 03/25/22 1356  BNP 1,356.3*    DDimer No results for input(s): "DDIMER" in the last 168 hours.   Radiology    ECHOCARDIOGRAM COMPLETE  Result Date: 03/26/2022    ECHOCARDIOGRAM REPORT   Patient Name:   Erin Riley Smoker Date of Exam: 03/26/2022 Medical Rec #:  [id number]   Height:       67.0 in Accession #:    [id number]  Weight:       230.2 lb Date of Birth:  November 24, 1950   BSA:          2.147 m Patient Age:    72 years    BP:           149/99 mmHg Patient Gender: F           HR:           78 bpm. Exam Location:  Inpatient Procedure: 2D Echo and Intracardiac Opacification Agent  Results communicated to Dr Grandville Silos at 13:05 on 03/26/22. Indications:     CHF  History:         Patient has prior history of Echocardiogram examinations, most                  recent 05/14/2020. CHF, PAD; Risk Factors:Hypertension and                   Dyslipidemia.  Sonographer:     Harvie Junior Referring Phys:  [id number] Jonnie Finner Diagnosing Phys: Oswaldo Milian MD  Sonographer Comments: Patient is obese. Image acquisition challenging due to patient body habitus. IMPRESSIONS  1. Left ventricular ejection fraction, by estimation, is 25 to 30%. The left ventricle has severely decreased function. The left ventricle demonstrates global hypokinesis. There is severe left ventricular hypertrophy. Left ventricular diastolic parameters are consistent with Grade II diastolic dysfunction (pseudonormalization). Elevated left atrial pressure.  2. Right ventricular systolic function is mildly reduced. The right ventricular size is normal. There is normal pulmonary artery systolic pressure. The estimated right ventricular systolic pressure is 47.0 mmHg.  3. Left atrial size was mildly dilated.  4. The mitral valve is normal in structure. Mild mitral valve regurgitation. No evidence of mitral stenosis.  5. The inferior vena cava is dilated in size with >50% respiratory variability, suggesting right atrial pressure of 8 mmHg.  6. Cannot exclude a small PFO.  7. The aortic valve is tricuspid. There is severe calcifcation of the aortic valve. Aortic valve regurgitation is mild. Severe aortic valve stenosis. Vmax 3.8 m/s, MG 36 mmHg, AVA 0.7cm^2, DI 0.21 FINDINGS  Left Ventricle: Left ventricular ejection fraction, by estimation, is 25 to 30%. The left ventricle has severely decreased function. The left ventricle demonstrates global hypokinesis. Definity contrast agent was given IV to delineate the left ventricular endocardial borders. The left ventricular internal cavity size was normal in size. There is severe left ventricular hypertrophy. Left ventricular diastolic parameters are consistent with Grade II diastolic dysfunction (pseudonormalization). Elevated left atrial pressure. Right Ventricle: The right ventricular size is normal. Right vetricular wall  thickness was  not well visualized. Right ventricular systolic function is mildly reduced. There is normal pulmonary artery systolic pressure. The tricuspid regurgitant velocity is 2.38 m/s, and with an assumed right atrial pressure of 8 mmHg, the estimated right ventricular systolic pressure is 10.1 mmHg. Left Atrium: Left atrial size was mildly dilated. Right Atrium: Right atrial size was normal in size. Pericardium: Trivial pericardial effusion is present. Mitral Valve: The mitral valve is normal in structure. Mild mitral valve regurgitation. No evidence of mitral valve stenosis. Tricuspid Valve: The tricuspid valve is normal in structure. Tricuspid valve regurgitation is mild. Aortic Valve: The aortic valve is tricuspid. There is severe calcifcation of the aortic valve. Aortic valve regurgitation is mild. Aortic regurgitation PHT measures 377 msec. Severe aortic stenosis is present. Aortic valve mean gradient measures 33.2 mmHg. Aortic valve peak gradient measures 46.0 mmHg. Pulmonic Valve: The pulmonic valve was normal in structure. Pulmonic valve regurgitation is trivial. Aorta: The aortic root is normal in size and structure. Venous: The inferior vena cava is dilated in size with greater than 50% respiratory variability, suggesting right atrial pressure of 8 mmHg. IAS/Shunts: Cannot exclude a small PFO.  LEFT VENTRICLE PLAX 2D LVIDd:         5.40 cm      Diastology LVIDs:         4.80 cm      LV e' medial:    3.70 cm/s LV PW:         2.30 cm      LV E/e' medial:  22.5 LV IVS:        1.20 cm      LV e' lateral:   4.79 cm/s                             LV E/e' lateral: 17.3  LV Volumes (MOD) LV vol d, MOD A2C: 213.0 ml LV vol d, MOD A4C: 176.0 ml LV vol s, MOD A2C: 165.0 ml LV vol s, MOD A4C: 140.0 ml LV SV MOD A2C:     48.0 ml LV SV MOD A4C:     176.0 ml LV SV MOD BP:      45.4 ml RIGHT VENTRICLE RV Basal diam:  4.10 cm RV Mid diam:    2.80 cm RV S prime:     9.14 cm/s TAPSE (M-mode): 1.8 cm LEFT ATRIUM               Index        RIGHT ATRIUM           Index LA diam:        4.60 cm  2.14 cm/m   RA Area:     17.60 cm LA Vol (A2C):   110.0 ml 51.24 ml/m  RA Volume:   40.90 ml  19.05 ml/m LA Vol (A4C):   48.8 ml  22.73 ml/m LA Biplane Vol: 74.1 ml  34.52 ml/m  AORTIC VALVE                    PULMONIC VALVE AV Vmax:           339.20 cm/s  PV Vmax:          0.99 m/s AV Vmean:          271.750 cm/s PV Peak grad:     3.9 mmHg AV VTI:            0.715 m  PR End Diast Vel: 9.24 msec AV Peak Grad:      46.0 mmHg AV Mean Grad:      33.2 mmHg LVOT Vmax:         75.05 cm/s LVOT Vmean:        54.500 cm/s LVOT VTI:          0.152 m LVOT/AV VTI ratio: 0.21 AI PHT:            377 msec MITRAL VALVE               TRICUSPID VALVE MV Area (PHT): 3.72 cm    TV Peak grad:   27.2 mmHg MV Decel Time: 204 msec    TV Vmax:        2.61 m/s MR Peak grad: 130.5 mmHg   TR Peak grad:   22.7 mmHg MR Mean grad: 104.0 mmHg   TR Vmax:        238.00 cm/s MR Vmax:      571.25 cm/s MR Vmean:     484.0 cm/s   SHUNTS MV E velocity: 83.10 cm/s  Systemic VTI: 0.15 m MV A velocity: 78.80 cm/s MV E/A ratio:  1.05 Epifanio Lesches MD Electronically signed by Epifanio Lesches MD Signature Date/Time: 03/26/2022/1:09:55 PM    Final (Updated)    DG Chest 2 View  Result Date: 03/25/2022 CLINICAL DATA:  Shortness of breath and congestion. EXAM: CHEST - 2 VIEW COMPARISON:  04/13/2021 FINDINGS: Low volume film. The cardio pericardial silhouette is enlarged. Vascular congestion noted with diffuse hazy bilateral airspace opacity. No substantial pleural effusion status post right shoulder replacement. Telemetry leads overlie the chest. IMPRESSION: Low volume film with vascular congestion and diffuse hazy bilateral airspace opacity suggesting pulmonary edema. Electronically Signed   By: Kennith Center M.D.   On: 03/25/2022 13:43    Cardiac Studies   Echo from 03/26/22:  1. Left ventricular ejection fraction, by estimation, is 25 to 30%. The  left  ventricle has severely decreased function. The left ventricle  demonstrates global hypokinesis. There is severe left ventricular  hypertrophy. Left ventricular diastolic  parameters are consistent with Grade II diastolic dysfunction  (pseudonormalization). Elevated left atrial pressure.   2. Right ventricular systolic function is mildly reduced. The right  ventricular size is normal. There is normal pulmonary artery systolic  pressure. The estimated right ventricular systolic pressure is 30.7 mmHg.   3. Left atrial size was mildly dilated.   4. The mitral valve is normal in structure. Mild mitral valve  regurgitation. No evidence of mitral stenosis.   5. The inferior vena cava is dilated in size with >50% respiratory  variability, suggesting right atrial pressure of 8 mmHg.   6. Cannot exclude a small PFO.   7. The aortic valve is tricuspid. There is severe calcifcation of the  aortic valve. Aortic valve regurgitation is mild. Severe aortic valve  stenosis. Vmax 3.8 m/s, MG 36 mmHg, AVA 0.7cm^2, DI 0.21    Echo from 05/14/20:  1. Left ventricular ejection fraction, by estimation, is 30 to 35%. The  left ventricle has moderately decreased function. The left ventricle  demonstrates global hypokinesis. There is severe asymmetric left  ventricular hypertrophy of the  posterior-lateral segment. Left ventricular diastolic parameters are  consistent with Grade III diastolic dysfunction (restrictive).   2. Right ventricular systolic function is normal. The right ventricular  size is normal. There is moderately elevated pulmonary artery systolic  pressure. The estimated right ventricular systolic pressure is 46.6 mmHg.  3. Left atrial size was severely dilated.   4. The mitral valve is grossly normal. Mild mitral valve regurgitation.  No evidence of mitral stenosis.   5. Tricuspid valve regurgitation is moderate.   6. The aortic valve is abnormal. There is severe calcifcation of the  aortic  valve. Aortic valve regurgitation is trivial. Moderate aortic valve  stenosis. Aortic valve mean gradient measures 25.2 mmHg.   7. Aortic dilatation noted. There is borderline dilatation of the  ascending aorta, measuring 39 mm.   8. The inferior vena cava is dilated in size with <50% respiratory  variability, suggesting right atrial pressure of 15 mmHg.   9. Cannot exclude atrial level shunt suggested by color flow Doppler.  Patient Profile     72 y.o. 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   Assessment & Plan    Acute on chronic combined CHF  - presented with SOB, worsening leg edema, and orthopnea for few days  - due to medical non-compliance with medication as well as lifestyle and progressing AS, limited by underlying mental illness  - Echo from 03/26/22 with LVEF worsening to 25-30%, grade II DD, mild MR, mildly reduced RV, Severe aortic valve stenosis. Vmax 3.8 m/s, MG 36 mmHg, AVA 0.7cm^2, DI 0.21  - s/p IV Lasix 40mg  BID since 03/25/22, weight down 236 >231 ibs, UOP not accurate, labs with AKI today with Cr 1.07 >> 1.56, GFR down 36, would hold diuresis given euvolemic exam today  - GDMT: continue hydralazine 50mg  BID, imdur 60mg  daily, will stop amlodipine 5mg  and start coreg 3.125mg  BID, hold spironolactone today, probably not candidate for ARNI/SGLT2I given CKD III   Severe aortic stenosis  - ideally would go for TAVR evaluation, schizophrenia and medical non-compliance are limiting, if mental illness stable this may be an option in the future  - continue symptom management   HTN - BP controlled, see above change   Moderate non-obstructive CAD by cath 2011  - no chest pain  - Hs trop trop 44 x1 - EKG at admission with  sinus tachycardia, LVH with warly repolarization abnormalities, unchanged - continue medical therapy with ASA 81mg , lipitor 40mg , started coreg 3.125mg  BID today, continue Imdur 60mg    AKI on CKD III Schizophrenia Anemia  - per primary team    For questions or updates, please contact [facility] Please consult [email or web address] for contact info under        Signed, , NP  03/27/2022, 8:35 AM

## 2022-03-27 NOTE — Consult Note (Addendum)
Patient seen and attempted to assess and which she declined several times.Patient was observed laying in bed, and she declined answering any question from the writer and she told the writer to leave her room that she does not want to " talk about anything". She was started on Olanzapine 10 mg daily and 10 mg at bedtime. No signs and symptoms of psychosis was observed and will try to reassess patient tomorrow. Will reduce olanzapine 5mg  po BID in lieu of current cardiac issues. Patient does not require high dose of olanzapine at this time, as there are limited signs of acute or chronic psychosis displayed during brief assessment.    -Reviewed EKG; QTc 468 on 03/25/22 -Reduce olanzapine 5mg  po BID.  -Will attempt to reassess tomorrow.

## 2022-03-27 NOTE — Plan of Care (Signed)
Nutrition Education Note  RD consulted for nutrition education regarding CHF.  RD provided "Low Sodium Nutrition Therapy" handout from the [employer].  Attempted to review patient's dietary recall however patient would not provide information on meals, snacks, or common foods at home. Stated she eats when she wants and cooks what she wants.   Provided examples on ways to decrease sodium intake in diet. Discouraged intake of processed foods and use of salt shaker. Encouraged fresh fruits and vegetables.  RD discussed why it is important for patient to adhere to diet recommendations, and emphasized the role of fluids, foods to avoid, and importance of weighing self daily.   Patient reported she did not think nutrition and heart failure were related and did not seem ready for dietary change. Agreeable for RD to leave diet education handout in room but suspect patient is not ready for change.  Expect poor compliance.  Body mass index is 36.29 kg/m. Pt meets criteria for Obesity II based on current BMI.  Current diet order is [employer], patient is consuming approximately 100% of meals at this time. Labs and medications reviewed. No further nutrition interventions warranted at this time. RD contact information provided. If additional nutrition issues arise, please re-consult RD.   Samson Frederic RD, LDN For contact information, refer to [facility].

## 2022-03-27 NOTE — Plan of Care (Signed)

## 2022-03-27 NOTE — Progress Notes (Signed)
Mobility Specialist - Progress Note  Pre-mobility: 88 bpm HR, 98% SpO2   03/27/22 1320  Oxygen Therapy  O2 Device Room Air  Mobility  Activity Stood at bedside  Level of Assistance Standby assist, set-up cues, supervision of patient - no hands on  Assistive Device None  Range of Motion/Exercises Active  Activity Response Tolerated well  Mobility Referral Yes  $Mobility charge 1 Mobility   Pt was found on bed and agreeable to ambulate. Once sitting EOB pt was able to stand on her own and sit back down to put her shoes on. Once sitting EOB stated she did not want to walk anymore that she has walked enough. At EOS she wanted to stay sitting EOB and was left with all necessities in reach. RN notified of session.  Ferd Hibbs Mobility Specialist

## 2022-03-28 ENCOUNTER — Other Ambulatory Visit (HOSPITAL_COMMUNITY): Payer: Self-pay

## 2022-03-28 DIAGNOSIS — I5043 Acute on chronic combined systolic (congestive) and diastolic (congestive) heart failure: Secondary | ICD-10-CM | POA: Diagnosis not present

## 2022-03-28 DIAGNOSIS — I169 Hypertensive crisis, unspecified: Secondary | ICD-10-CM | POA: Diagnosis not present

## 2022-03-28 DIAGNOSIS — I161 Hypertensive emergency: Secondary | ICD-10-CM | POA: Diagnosis not present

## 2022-03-28 DIAGNOSIS — I10 Essential (primary) hypertension: Secondary | ICD-10-CM

## 2022-03-28 DIAGNOSIS — I5189 Other ill-defined heart diseases: Secondary | ICD-10-CM

## 2022-03-28 DIAGNOSIS — E785 Hyperlipidemia, unspecified: Secondary | ICD-10-CM | POA: Diagnosis not present

## 2022-03-28 LAB — BASIC METABOLIC PANEL
Anion gap: 7 (ref 5–15)
BUN: 30 mg/dL — ABNORMAL HIGH (ref 8–23)
CO2: 27 mmol/L (ref 22–32)
Calcium: 9.1 mg/dL (ref 8.9–10.3)
Chloride: 103 mmol/L (ref 98–111)
Creatinine, Ser: 1.47 mg/dL — ABNORMAL HIGH (ref 0.44–1.00)
GFR, Estimated: 38 mL/min — ABNORMAL LOW (ref >60)
Glucose, Bld: 97 mg/dL (ref 70–99)
Potassium: 3.8 mmol/L (ref 3.5–5.1)
Sodium: 137 mmol/L (ref 135–145)

## 2022-03-28 LAB — MAGNESIUM: Magnesium: 2.6 mg/dL — ABNORMAL HIGH (ref 1.7–2.4)

## 2022-03-28 MED ORDER — ISOSORBIDE MONONITRATE ER 60 MG PO TB24
60.0000 mg | ORAL_TABLET | Freq: Every day | ORAL | 1 refills | Status: DC
  Filled 2022-03-28: qty 30, 30d supply, fill #0
  Filled 2022-04-27 (×2): qty 30, 30d supply, fill #1

## 2022-03-28 MED ORDER — HYDRALAZINE HCL 50 MG PO TABS
50.0000 mg | ORAL_TABLET | Freq: Two times a day (BID) | ORAL | 1 refills | Status: DC
  Filled 2022-03-28: qty 60, 30d supply, fill #0
  Filled 2022-04-27 (×2): qty 60, 30d supply, fill #1

## 2022-03-28 MED ORDER — CARVEDILOL 3.125 MG PO TABS
3.1250 mg | ORAL_TABLET | Freq: Two times a day (BID) | ORAL | 1 refills | Status: DC
  Filled 2022-03-28: qty 60, 30d supply, fill #0
  Filled 2022-04-27 (×2): qty 60, 30d supply, fill #1

## 2022-03-28 MED ORDER — OLANZAPINE 5 MG PO TBDP
5.0000 mg | ORAL_TABLET | Freq: Once | ORAL | Status: AC
  Administered 2022-03-28: 5 mg via ORAL
  Filled 2022-03-28: qty 1

## 2022-03-28 MED ORDER — FUROSEMIDE 40 MG PO TABS
40.0000 mg | ORAL_TABLET | Freq: Every day | ORAL | 1 refills | Status: DC
  Filled 2022-03-28: qty 30, 30d supply, fill #0
  Filled 2022-04-27 (×2): qty 30, 30d supply, fill #1

## 2022-03-28 MED ORDER — ATORVASTATIN CALCIUM 40 MG PO TABS
40.0000 mg | ORAL_TABLET | Freq: Every day | ORAL | 1 refills | Status: DC
  Filled 2022-03-28: qty 30, 30d supply, fill #0
  Filled 2022-04-27 (×2): qty 30, 30d supply, fill #1

## 2022-03-28 MED ORDER — OLANZAPINE 5 MG PO TBDP
5.0000 mg | ORAL_TABLET | Freq: Two times a day (BID) | ORAL | 1 refills | Status: DC
  Filled 2022-03-28: qty 10, 5d supply, fill #0
  Filled 2022-03-28: qty 52, 26d supply, fill #0
  Filled 2022-03-28: qty 8, 4d supply, fill #0
  Filled 2022-04-27 – 2022-05-29 (×2): qty 60, 30d supply, fill #1

## 2022-03-28 NOTE — TOC Transition Note (Signed)
Transition of Care [facility]) - CM/SW Discharge Note   Patient Details  Name: Erin Riley MRN: [id number] Date of Birth: 09-Aug-1950  [facility] CM/SW Contact:  Leeroy Cha, RN Phone Number: 03/28/2022, 3:10 PM   Clinical Narrative:    Referral to [facility] done for post hoispital follow up         Patient Goals and CMS Choice      Discharge Placement                         Discharge Plan and Services Additional resources added to the After Visit Summary for   In-house Referral: [facility], Clinical Social Work Discharge Planning Services: CM Consult                                 Social Determinants of Health (SDOH) Interventions SDOH Screenings   Food Insecurity: No Food Insecurity (03/25/2022)  Housing: Low Risk  (03/25/2022)  Transportation Needs: No Transportation Needs (03/25/2022)  Utilities: Not At Risk (03/25/2022)  Alcohol Screen: Low Risk  (12/17/2019)  Depression (PHQ2-9): Low Risk  (01/26/2021)  Tobacco Use: High Risk (03/25/2022)     Readmission Risk Interventions   No data to display

## 2022-03-28 NOTE — Progress Notes (Signed)
Rounding Note    Patient Name: Erin Riley Date of Encounter: 03/28/2022  [facility] Cardiologist: Glenetta Hew, MD   Subjective   Patient is sleepy, woke up to voice, states she is fine. She states her breathing is better. She is urinating without issue. She states "I have been told many times that " when discussed the need of diuretic going forward. She states water pill makes her sweat.   Inpatient Medications    Scheduled Meds:  aspirin EC  81 mg Oral Daily   atorvastatin  40 mg Oral Daily   carvedilol  3.125 mg Oral BID WC   enoxaparin (LOVENOX) injection  50 mg Subcutaneous Q24H   hydrALAZINE  50 mg Oral BID   isosorbide mononitrate  60 mg Oral Daily   OLANZapine zydis  5 mg Oral Daily   OLANZapine zydis  5 mg Oral QHS   Continuous Infusions:  PRN Meds: acetaminophen **OR** acetaminophen, benzonatate, mouth rinse, prochlorperazine   Vital Signs    Vitals:   03/27/22 0502 03/27/22 1427 03/27/22 2002 03/28/22 0501  BP: 129/67 (!) 104/57 119/89 133/81  Pulse:  81 90 72  Resp: 20 18 19 20   Temp: 98.3 F (36.8 C) (!) 97.5 F (36.4 C) 97.6 F (36.4 C) 98.1 F (36.7 C)  TempSrc: Oral Oral Oral Oral  SpO2: 98% 98% 99% 98%  Weight: 105.1 kg   104.9 kg  Height:        Intake/Output Summary (Last 24 hours) at 03/28/2022 6063 Last data filed at 03/27/2022 2242 Gross per 24 hour  Intake 120 ml  Output --  Net 120 ml      03/28/2022    5:01 AM 03/27/2022    5:02 AM 03/26/2022    3:37 AM  Last 3 Weights  Weight (lbs) 231 lb 4.2 oz 231 lb 11.3 oz 230 lb 2.6 oz  Weight (kg) 104.9 kg 105.1 kg 104.4 kg      Telemetry    Sinus rhythm, episode of sinus tachycardia 100-110s, occasional PVCs, artifacts  - Personally Reviewed  ECG    No new tracing today  - Personally Reviewed  Physical Exam   GEN: No acute distress Neck: No JVD Cardiac: RRR, systolic murmur grade III at RUSB  Respiratory: Scattered wheezing noted, on room air, speaks full  sentence  GI: Soft, nontender MS: No leg edema Neuro:  mild cognitive impairment noted    Psych: Limited historian due to poor insight, calm and cooperative   Labs    High Sensitivity Troponin:   Recent Labs  Lab 03/26/22 0945  TROPONINIHS 44*     Chemistry Recent Labs  Lab 03/26/22 0530 03/27/22 0442 03/28/22 0417  NA 138 139 137  K 3.8 3.7 3.8  CL 104 105 103  CO2 27 27 27   GLUCOSE 87 99 97  BUN 20 30* 30*  CREATININE 1.41* 1.56* 1.47*  CALCIUM 8.6* 9.3 9.1  MG  --  2.7* 2.6*  PROT 6.9  --   --   ALBUMIN 3.2*  --   --   AST 15  --   --   ALT 13  --   --   ALKPHOS 73  --   --   BILITOT 1.0  --   --   GFRNONAA 40* 36* 38*  ANIONGAP 7 7 7     Lipids  Recent Labs  Lab 03/27/22 0442  CHOL 147  TRIG 89  HDL 37*  LDLCALC 92  CHOLHDL 4.0  Hematology Recent Labs  Lab 03/25/22 1246 03/26/22 0530 03/27/22 0442  WBC 6.2 5.3 6.0  RBC 3.27* 3.06* 3.49*  HGB 9.7* 9.0* 10.3*  HCT 31.3* 28.9* 32.9*  MCV 95.7 94.4 94.3  MCH 29.7 29.4 29.5  MCHC 31.0 31.1 31.3  RDW 14.6 14.7 15.0  PLT 272 265 322   Thyroid No results for input(s): "TSH", "FREET4" in the last 168 hours.  BNP Recent Labs  Lab 03/25/22 1356  BNP 1,356.3*    DDimer No results for input(s): "DDIMER" in the last 168 hours.   Radiology    ECHOCARDIOGRAM COMPLETE  Result Date: 03/26/2022    ECHOCARDIOGRAM REPORT   Patient Name:   Erin Riley Hemler Date of Exam: 03/26/2022 Medical Rec #:  [id number]   Height:       67.0 in Accession #:    [id number]  Weight:       230.2 lb Date of Birth:  [birth year]   BSA:          2.147 m Patient Age:    71 years    BP:           149/99 mmHg Patient Gender: F           HR:           78 bpm. Exam Location:  Inpatient Procedure: 2D Echo and Intracardiac Opacification Agent  Results communicated to Dr Janee Morn at 13:05 on 03/26/22. Indications:     CHF  History:         Patient has prior history of Echocardiogram examinations, most                  recent 05/14/2020. CHF, PAD;  Risk Factors:Hypertension and                  Dyslipidemia.  Sonographer:     Cathie Hoops Referring Phys:  [id number] Teddy Spike Diagnosing Phys: Epifanio Lesches MD  Sonographer Comments: Patient is obese. Image acquisition challenging due to patient body habitus. IMPRESSIONS  1. Left ventricular ejection fraction, by estimation, is 25 to 30%. The left ventricle has severely decreased function. The left ventricle demonstrates global hypokinesis. There is severe left ventricular hypertrophy. Left ventricular diastolic parameters are consistent with Grade II diastolic dysfunction (pseudonormalization). Elevated left atrial pressure.  2. Right ventricular systolic function is mildly reduced. The right ventricular size is normal. There is normal pulmonary artery systolic pressure. The estimated right ventricular systolic pressure is 30.7 mmHg.  3. Left atrial size was mildly dilated.  4. The mitral valve is normal in structure. Mild mitral valve regurgitation. No evidence of mitral stenosis.  5. The inferior vena cava is dilated in size with >50% respiratory variability, suggesting right atrial pressure of 8 mmHg.  6. Cannot exclude a small PFO.  7. The aortic valve is tricuspid. There is severe calcifcation of the aortic valve. Aortic valve regurgitation is mild. Severe aortic valve stenosis. Vmax 3.8 m/s, MG 36 mmHg, AVA 0.7cm^2, DI 0.21 FINDINGS  Left Ventricle: Left ventricular ejection fraction, by estimation, is 25 to 30%. The left ventricle has severely decreased function. The left ventricle demonstrates global hypokinesis. Definity contrast agent was given IV to delineate the left ventricular endocardial borders. The left ventricular internal cavity size was normal in size. There is severe left ventricular hypertrophy. Left ventricular diastolic parameters are consistent with Grade II diastolic dysfunction (pseudonormalization). Elevated left atrial pressure. Right Ventricle: The right ventricular size is  normal. Right vetricular wall thickness was  not well visualized. Right ventricular systolic function is mildly reduced. There is normal pulmonary artery systolic pressure. The tricuspid regurgitant velocity is 2.38 m/s, and with an assumed right atrial pressure of 8 mmHg, the estimated right ventricular systolic pressure is 87.5 mmHg. Left Atrium: Left atrial size was mildly dilated. Right Atrium: Right atrial size was normal in size. Pericardium: Trivial pericardial effusion is present. Mitral Valve: The mitral valve is normal in structure. Mild mitral valve regurgitation. No evidence of mitral valve stenosis. Tricuspid Valve: The tricuspid valve is normal in structure. Tricuspid valve regurgitation is mild. Aortic Valve: The aortic valve is tricuspid. There is severe calcifcation of the aortic valve. Aortic valve regurgitation is mild. Aortic regurgitation PHT measures 377 msec. Severe aortic stenosis is present. Aortic valve mean gradient measures 33.2 mmHg. Aortic valve peak gradient measures 46.0 mmHg. Pulmonic Valve: The pulmonic valve was normal in structure. Pulmonic valve regurgitation is trivial. Aorta: The aortic root is normal in size and structure. Venous: The inferior vena cava is dilated in size with greater than 50% respiratory variability, suggesting right atrial pressure of 8 mmHg. IAS/Shunts: Cannot exclude a small PFO.  LEFT VENTRICLE PLAX 2D LVIDd:         5.40 cm      Diastology LVIDs:         4.80 cm      LV e' medial:    3.70 cm/s LV PW:         2.30 cm      LV E/e' medial:  22.5 LV IVS:        1.20 cm      LV e' lateral:   4.79 cm/s                             LV E/e' lateral: 17.3  LV Volumes (MOD) LV vol d, MOD A2C: 213.0 ml LV vol d, MOD A4C: 176.0 ml LV vol s, MOD A2C: 165.0 ml LV vol s, MOD A4C: 140.0 ml LV SV MOD A2C:     48.0 ml LV SV MOD A4C:     176.0 ml LV SV MOD BP:      45.4 ml RIGHT VENTRICLE RV Basal diam:  4.10 cm RV Mid diam:    2.80 cm RV S prime:     9.14 cm/s TAPSE  (M-mode): 1.8 cm LEFT ATRIUM              Index        RIGHT ATRIUM           Index LA diam:        4.60 cm  2.14 cm/m   RA Area:     17.60 cm LA Vol (A2C):   110.0 ml 51.24 ml/m  RA Volume:   40.90 ml  19.05 ml/m LA Vol (A4C):   48.8 ml  22.73 ml/m LA Biplane Vol: 74.1 ml  34.52 ml/m  AORTIC VALVE                    PULMONIC VALVE AV Vmax:           339.20 cm/s  PV Vmax:          0.99 m/s AV Vmean:          271.750 cm/s PV Peak grad:     3.9 mmHg AV VTI:            0.715 m  PR End Diast Vel: 9.24 msec AV Peak Grad:      46.0 mmHg AV Mean Grad:      33.2 mmHg LVOT Vmax:         75.05 cm/s LVOT Vmean:        54.500 cm/s LVOT VTI:          0.152 m LVOT/AV VTI ratio: 0.21 AI PHT:            377 msec MITRAL VALVE               TRICUSPID VALVE MV Area (PHT): 3.72 cm    TV Peak grad:   27.2 mmHg MV Decel Time: 204 msec    TV Vmax:        2.61 m/s MR Peak grad: 130.5 mmHg   TR Peak grad:   22.7 mmHg MR Mean grad: 104.0 mmHg   TR Vmax:        238.00 cm/s MR Vmax:      571.25 cm/s MR Vmean:     484.0 cm/s   SHUNTS MV E velocity: 83.10 cm/s  Systemic VTI: 0.15 m MV A velocity: 78.80 cm/s MV E/A ratio:  1.05 Epifanio Lesches MD Electronically signed by Epifanio Lesches MD Signature Date/Time: 03/26/2022/1:09:55 PM    Final (Updated)     Cardiac Studies   Echo from 03/26/22:  1. Left ventricular ejection fraction, by estimation, is 25 to 30%. The  left ventricle has severely decreased function. The left ventricle  demonstrates global hypokinesis. There is severe left ventricular  hypertrophy. Left ventricular diastolic  parameters are consistent with Grade II diastolic dysfunction  (pseudonormalization). Elevated left atrial pressure.   2. Right ventricular systolic function is mildly reduced. The right  ventricular size is normal. There is normal pulmonary artery systolic  pressure. The estimated right ventricular systolic pressure is 30.7 mmHg.   3. Left atrial size was mildly dilated.   4.  The mitral valve is normal in structure. Mild mitral valve  regurgitation. No evidence of mitral stenosis.   5. The inferior vena cava is dilated in size with >50% respiratory  variability, suggesting right atrial pressure of 8 mmHg.   6. Cannot exclude a small PFO.   7. The aortic valve is tricuspid. There is severe calcifcation of the  aortic valve. Aortic valve regurgitation is mild. Severe aortic valve  stenosis. Vmax 3.8 m/s, MG 36 mmHg, AVA 0.7cm^2, DI 0.21    Echo from 05/14/20:  1. Left ventricular ejection fraction, by estimation, is 30 to 35%. The  left ventricle has moderately decreased function. The left ventricle  demonstrates global hypokinesis. There is severe asymmetric left  ventricular hypertrophy of the  posterior-lateral segment. Left ventricular diastolic parameters are  consistent with Grade III diastolic dysfunction (restrictive).   2. Right ventricular systolic function is normal. The right ventricular  size is normal. There is moderately elevated pulmonary artery systolic  pressure. The estimated right ventricular systolic pressure is 46.6 mmHg.   3. Left atrial size was severely dilated.   4. The mitral valve is grossly normal. Mild mitral valve regurgitation.  No evidence of mitral stenosis.   5. Tricuspid valve regurgitation is moderate.   6. The aortic valve is abnormal. There is severe calcifcation of the  aortic valve. Aortic valve regurgitation is trivial. Moderate aortic valve  stenosis. Aortic valve mean gradient measures 25.2 mmHg.   7. Aortic dilatation noted. There is borderline dilatation of the  ascending aorta, measuring 39 mm.  8. The inferior vena cava is dilated in size with <50% respiratory  variability, suggesting right atrial pressure of 15 mmHg.   9. Cannot exclude atrial level shunt suggested by color flow Doppler.  Patient Profile     72 y.o. 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   Assessment & Plan    Acute on chronic combined CHF  - presented with SOB, worsening leg edema, and orthopnea for few days  - due to medical non-compliance with medication as well as lifestyle and progressing AS, limited by underlying mental illness  - Echo from 03/26/22 with LVEF worsening to 25-30%, grade II DD, mild MR, mildly reduced RV, Severe aortic valve stenosis. Vmax 3.8 m/s, MG 36 mmHg, AVA 0.7cm^2, DI 0.21  - s/p IV Lasix 40mg  BID since 03/25/22, weight down 236 >231 ibs, UOP not accurate, labs with AKI on 03/27/22 with Cr 1.07 >> 1.56, diuresis held given euvolemic status, Cr recovering 1.47 today, may start PO Lasix 40mg  daily once renal index near baseline - GDMT: continue hydralazine 50mg  BID, imdur 60mg  daily, stop amlodipine 5mg , start coreg 3.125mg  BID, hold spironolactone until AKI resolves, probably not candidate for ARNI/SGLT2I given CKD III, can be re-assess outpatient  - Follow up has been arranged on 04/05/22 with cardiology   Severe aortic stenosis  - ideally would go for TAVR evaluation, schizophrenia and medical non-compliance are limiting, if mental illness stable this may be an option in the future  - continue symptom management   HTN - BP controlled, see above change   Moderate non-obstructive CAD by cath 2011  - no chest pain  - Hs trop trop 44 x1 - EKG at admission with sinus tachycardia, LVH with warly repolarization abnormalities, unchanged - continue medical therapy with ASA 81mg , lipitor 40mg , started coreg 3.125mg  BID, continue Imdur 60mg    AKI on CKD III Schizophrenia Anemia  - per primary team    For questions or updates, please contact  [facility] Please consult [email or web address] for contact info under        Signed, , NP   03/28/2022, 8:07 AM

## 2022-03-28 NOTE — Consult Note (Cosign Needed Addendum)
[facility] Face-to-Face Psychiatry Consult   Reason for Consult: Management of schizophrenia to get under control for further management of cardiac issues Referring Physician: Dr. Janee Morn Patient Identification: Erin Riley MRN:  [id number] Principal Diagnosis: Acute on chronic combined systolic (congestive) and diastolic (congestive) heart failure (HCC) Diagnosis:  Principal Problem:   Acute on chronic combined systolic (congestive) and diastolic (congestive) heart failure (HCC) Active Problems:   Essential hypertension   Paranoid schizophrenia (HCC)   CKD (chronic kidney disease) stage 3, GFR 30-59 ml/min (HCC)   Hyperlipidemia with target LDL less than 100   Acute on chronic combined systolic and diastolic CHF (congestive heart failure) (HCC)   Normocytic anemia   Severe aortic stenosis   Hypertensive urgency   Hypertensive emergency   Total Time spent with patient: 30 minutes  Subjective:   Erin Riley is a 72 y.o. female patient admitted with CHF exacerbation.  Psychiatric provider may second attempt to assess patient, initially was requested to leave the room.  However patient was able to agree to answer baseline psychiatric questions and check for psychiatric stabilization.  Patient reports being admitted for shortness of breath.  She admits to noncompliance with all medication, unable to explain why she stopped taking her medication.  However she denies any paranoia or delusions that resulted in her not taking her home medication as directed.  Chart review further shows patient has a longstanding history of noncompliance that resulted in multiple hospital admissions and acute decompensation of schizophrenia.  Patient is a poor historian and has poor overall clinical judgment due to her underlying psych problems.  She has predominantly been noncompliant with both medications and psychiatric follow-up for an unknown amount of time.  She does report having good response to Zyprexa,  however does not take.  Patient continues to present with poor insight and judgment, and decreased awareness about medications.  Many of her behaviors and responses continued to be inappropriate at times, patient does require frequent redirection and difficult to convince to interact with providers across the health system.  The inappropriate actions, impulsivity, decreased awareness, and poor judgment can lead to episodes of irritability, aggression and noncompliance.    On interview, patient appears to be resting.  She is very easy to arouse, grudgingly responsive throughout the evaluation.  Patient admits to medication compliance while in the hospital.  She denies any side effects or adverse reactions as it pertains to resuming her olanzapine 5 mg p.o. twice daily.  She did not agree to physical exam which was deferred to prevent further agitation and a psychiatric patient.   On today's reassessment patient did not present with any focal neurological deficit, she was able to speak clearly, follow commands, and denies weakness.   At this current time, patient's pre-existing condition that was substantially aggravated prior to this admission has returned to previous level.  Patient further states that her current health at this present time seems to be normal as she is noted to feel better and like her self again.  She is not lethargic on today's exam, shows some-what linear thought processes, and answers most questions with irritable tone.  She denies suicidal ideation, homicidal ideation, and or auditory or visual hallucinations   HPI:  Erin Riley is a 72 y.o. female with medical history significant of schizophrenia, HTN, HLD, chronic combined HF, CKD3a. Difficult historian as her history is changing with further questioning. Essentially, she has had several days of increased cough and mucus. She has become short of  breath and has noticed some swelling in her legs. She reports some orthopnea. She states  that she has not taken her regular medications because she was pregnant since last year and insists she has papers to prove it. She denies chest pain, palpitations, fevers, or sick contacts. When her symptoms did not improve today, she decided to come to the ED for evaluation.   Past Psychiatric History: Schizophrenia  Risk to Self:   Denies Risk to Others:   Denies Prior Inpatient Therapy:  Last inpatient psychiatric hospitalization in August 2023 at [place] not available for review.  Prior to this hospitalization February 2023 was admitted to [facility] Prior Outpatient Therapy:  Seen at [facility]  Past Medical History:  Past Medical History:  Diagnosis Date   Chronic back pain    Chronic kidney disease (CKD), stage II (mild)    Class I-II   Coronary artery disease 04/2009   50% stenosis in the perforator of LAD; catheterization was for an abnormal Myoview in January 2000 showing anterior and inferolateral ischemia.   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 > 3 subsequent Echos show improved/normal EF with moderate to severe concentric LVH and diastolic dysfunction with LVOT/intracavitary gradient --> 06/2016: Severe LVH.  Vigorous EF, 65-70%.?? Gr 1 DD. Mild AS.   Hyperlipidemia    Hypertension    Hypertensive hypertrophic cardiomyopathy: NYHA class II:  Echo: Severe concentric LVH with LV OT gradient; essentially preserved EF with diastolic dysfunction 16/12/9602   Echo 06/2016: Severe Concentric LVH. Vigorous EF 65-70%. ~ Gr I DD.    Mild aortic stenosis by prior echocardiography    Echo 06/2016: Mild AS (Mean Gradient 15 mmHg); has had prior Mod-Severe MR (not seen on current echo)   PAD (peripheral artery disease) ([place]) March 2013   Lower extremity Dopplers: R. SFA 50-60%, R. PTA proximally occluded with distal reconstitution;; L. common  iliac ~50%, L. SFA 50-70% stenosis, L. PTA < 50%   Schizophrenia ([place])     Past Surgical History:  Procedure Laterality Date   BUNIONECTOMY     carotid doppler  05/29/2011   left bulb/prox ICA moderate amtfibrous plaque with no evidence significant reduction.,right bulb /proximal ICA normal patency   lower extremity doppler  05/29/2011   right SFA 50% to 59% diameter reduction,right posterior tibal atreery occlusive disease,reconstituting distally, left common illiac<50%,left SFA 50 to70%,left post. tibial <50%   NM MYOCAR PERF WALL MOTION  03/2009   Persantine; EF 51%-both anterior and inferolateral ischemia   TRANSTHORACIC ECHOCARDIOGRAM  06/2016   Severe LVH.  Vigorous EF of 65-70%.  No RWMA. ~Only grade 1 diastolic dysfunction.  Mild aortic stenosis (mean gradient 15 mmHg)   TRANSTHORACIC ECHOCARDIOGRAM  07/2012   EF 50-55%; severe concentric LVH; only grade 1 diastolic dysfunction. Mild aortic sclerosis - with LVOT /intracavitary gradient of roughly 20 mmHg mean. Mild to moderately dilated LA;; previously reported MR not seen   Family History:  Family History  Problem Relation Age of Onset   Hypertension Mother    Breast cancer Neg Hx    Family Psychiatric  History: Deferred Social History:  Social History   Substance and Sexual Activity  Alcohol Use No     Social History   Substance and Sexual Activity  Drug Use No    Social History   Socioeconomic History   Marital status: Single  Spouse name: Not on file   Number of children: 7   Years of education: Not on file   Highest education level: Not on file  Occupational History   Not on file  Tobacco Use   Smoking status: Some Days    Types: Cigarettes    Last attempt to quit: 05/14/2002    Years since quitting: 19.8   Smokeless tobacco: Never  Vaping Use   Vaping Use: Never used  Substance and Sexual Activity   Alcohol use: No   Drug use: No   Sexual activity: Yes    Birth control/protection:  Post-menopausal, None  Other Topics Concern   Not on file  Social History Narrative   Now single mother of 2 with one grandchild. She quit smoking roughly 5 years ago and is not so since. She has also stopped drinking alcohol. She does try get routine exercise walking at least a mile 3-4 days a week.    She lives with her 71 year old mother. 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   Social Determinants of Health   Financial Resource Strain: Not on file  Food Insecurity: No Food Insecurity (03/25/2022)   Hunger Vital Sign    Worried About Running Out of Food in the Last Year: Never true    Ran Out of Food in the Last Year: Never true  Transportation Needs: No Transportation Needs (03/25/2022)   PRAPARE - Administrator, Civil ServiceTransportation    Lack of Transportation (Medical): No    Lack of Transportation (Non-Medical): No  Physical Activity: Not on file  Stress: Not on file  Social Connections: Not on file   Additional Social History:    Allergies:  No Known Allergies  Labs:  Results for orders placed or performed during the hospital encounter of 03/25/22 (from the past 48 hour(s))  Glucose, capillary     Status: Abnormal   Collection Time: 03/26/22  9:28 PM  Result Value Ref Range   Glucose-Capillary 115 (H) 70 - 99 mg/dL    Comment: Glucose reference range applies only to samples taken after fasting for at least 8 hours.  Lipid panel     Status: Abnormal   Collection Time: 03/27/22  4:42 AM  Result Value Ref Range   Cholesterol 147 0 - 200 mg/dL   Triglycerides 89 <782<150 mg/dL   HDL 37 (L) >95>40 mg/dL   Total CHOL/HDL Ratio 4.0 RATIO   VLDL 18 0 - 40 mg/dL   LDL Cholesterol 92 0 - 99 mg/dL    Comment:        Total Cholesterol/HDL:CHD Risk Coronary Heart Disease Risk Table                     Men   Women  1/2 Average Risk   3.4   3.3  Average Risk       5.0   4.4  2 X Average Risk   9.6   7.1  3 X Average Risk  23.4   11.0        Use the calculated Patient Ratio above and the CHD Risk Table to  determine the patient's CHD Risk.        ATP III CLASSIFICATION (LDL):  <100     mg/dL   Optimal  [id number]  mg/dL   Near or Above                    Optimal  130-159  mg/dL   Borderline  [id number]  mg/dL  High  >190     mg/dL   Very High Performed at [facility], [address] [place], [place] [address]   CBC     Status: Abnormal   Collection Time: 03/27/22  4:42 AM  Result Value Ref Range   WBC 6.0 4.0 - 10.5 K/uL   RBC 3.49 (L) 3.87 - 5.11 MIL/uL   Hemoglobin 10.3 (L) 12.0 - 15.0 g/dL   HCT 60.432.9 (L) 54.036.0 - 98.146.0 %   MCV 94.3 80.0 - 100.0 fL   MCH 29.5 26.0 - 34.0 pg   MCHC 31.3 30.0 - 36.0 g/dL   RDW 19.115.0 47.811.5 - 29.515.5 %   Platelets 322 150 - 400 K/uL   nRBC 0.0 0.0 - 0.2 %    Comment: Performed at [facility], [address] [place], [place] [address]  Magnesium     Status: Abnormal   Collection Time: 03/27/22  4:42 AM  Result Value Ref Range   Magnesium 2.7 (H) 1.7 - 2.4 mg/dL    Comment: Performed at [facility], [address] [place], [place] [address]  Basic metabolic panel     Status: Abnormal   Collection Time: 03/27/22  4:42 AM  Result Value Ref Range   Sodium 139 135 - 145 mmol/L   Potassium 3.7 3.5 - 5.1 mmol/L   Chloride 105 98 - 111 mmol/L   CO2 27 22 - 32 mmol/L   Glucose, Bld 99 70 - 99 mg/dL    Comment: Glucose reference range applies only to samples taken after fasting for at least 8 hours.   BUN 30 (H) 8 - 23 mg/dL   Creatinine, Ser 4.691.56 (H) 0.44 - 1.00 mg/dL   Calcium 9.3 8.9 - 62.910.3 mg/dL   GFR, Estimated 36 (L) >60 mL/min    Comment: (NOTE) Calculated using the CKD-EPI Creatinine Equation (2021)    Anion gap 7 5 - 15    Comment: Performed at [facility], [address] [place], [place] [address]  Glucose, capillary     Status: Abnormal   Collection Time: 03/27/22  8:10 AM  Result Value Ref Range   Glucose-Capillary 100 (H) 70 - 99 mg/dL    Comment: Glucose  reference range applies only to samples taken after fasting for at least 8 hours.  Glucose, capillary     Status: Abnormal   Collection Time: 03/27/22 11:55 AM  Result Value Ref Range   Glucose-Capillary 119 (H) 70 - 99 mg/dL    Comment: Glucose reference range applies only to samples taken after fasting for at least 8 hours.  Basic metabolic panel     Status: Abnormal   Collection Time: 03/28/22  4:17 AM  Result Value Ref Range   Sodium 137 135 - 145 mmol/L   Potassium 3.8 3.5 - 5.1 mmol/L   Chloride 103 98 - 111 mmol/L   CO2 27 22 - 32 mmol/L   Glucose, Bld 97 70 - 99 mg/dL    Comment: Glucose reference range applies only to samples taken after fasting for at least 8 hours.   BUN 30 (H) 8 - 23 mg/dL   Creatinine, Ser 3.241.47 (H) 0.44 - 1.00 mg/dL   Calcium 9.1 8.9 - 40.110.3 mg/dL   GFR, Estimated 38 (L) >60 mL/min    Comment: (NOTE) Calculated using the CKD-EPI Creatinine Equation (2021)    Anion gap 7 5 - 15    Comment: Performed at [facility], [address] [place], [place] [address]  Magnesium     Status: Abnormal   Collection Time: 03/28/22  4:17 AM  Result Value Ref Range   Magnesium 2.6 (H) 1.7 - 2.4 mg/dL    Comment: Performed at [facility], [address] [place], [place] [address]    Current Facility-Administered Medications  Medication Dose Route Frequency Provider Last Rate Last Admin   acetaminophen (TYLENOL) tablet 650 mg  650 mg Oral Q6H PRN Ronaldo Miyamoto, Tyrone A, DO       Or   acetaminophen (TYLENOL) suppository 650 mg  650 mg Rectal Q6H PRN Ronaldo Miyamoto, Tyrone A, DO       aspirin EC tablet 81 mg  81 mg Oral Daily Rodolph Bong, MD   81 mg at 03/28/22 0925   atorvastatin (LIPITOR) tablet 40 mg  40 mg Oral Daily Kyle, Tyrone A, DO   40 mg at 03/28/22 5102   benzonatate (TESSALON) capsule 200 mg  200 mg Oral TID PRN Rodolph Bong, MD       carvedilol (COREG) tablet 3.125 mg  3.125 mg Oral BID WC Cyndi Bender, NP   3.125 mg at  03/28/22 0925   enoxaparin (LOVENOX) injection 50 mg  50 mg Subcutaneous Q24H Ronaldo Miyamoto, Tyrone A, DO   50 mg at 03/27/22 2138   hydrALAZINE (APRESOLINE) tablet 50 mg  50 mg Oral BID Rodolph Bong, MD   50 mg at 03/28/22 5852   isosorbide mononitrate (IMDUR) 24 hr tablet 60 mg  60 mg Oral Daily Rodolph Bong, MD   60 mg at 03/28/22 0925   OLANZapine zydis (ZYPREXA) disintegrating tablet 5 mg  5 mg Oral Daily Maryagnes Amos, FNP   5 mg at 03/28/22 0925   OLANZapine zydis (ZYPREXA) disintegrating tablet 5 mg  5 mg Oral QHS Maryagnes Amos, FNP   5 mg at 03/27/22 2138   Oral care mouth rinse  15 mL Mouth Rinse PRN Margie Ege A, DO       prochlorperazine (COMPAZINE) injection 10 mg  10 mg Intravenous Q6H PRN Margie Ege A, DO       Current Outpatient Medications  Medication Sig Dispense Refill   aspirin EC 81 MG EC tablet Take 1 tablet (81 mg total) by mouth daily. Swallow whole. 30 tablet 0   ferrous sulfate 325 (65 FE) MG EC tablet Take 325 mg by mouth daily with breakfast.     Multiple Vitamins-Minerals (MULTIVITAMIN WITH MINERALS) tablet Take 1 tablet by mouth daily.     triamcinolone cream (KENALOG) 0.1 % Apply 1 application topically 2 (two) times daily. Apply to rash on left thigh (Patient taking differently: Apply 1 application  topically 2 (two) times daily as needed (rash). Apply to rash on left thigh) 30 g 0   atorvastatin (LIPITOR) 40 MG tablet Take 1 tablet (40 mg total) by mouth daily. 30 tablet 1   carvedilol (COREG) 3.125 MG tablet Take 1 tablet (3.125 mg total) by mouth 2 (two) times daily with a meal. 60 tablet 1   furosemide (LASIX) 40 MG tablet Take 1 tablet (40 mg total) by mouth daily. 30 tablet 1   hydrALAZINE (APRESOLINE) 50 MG tablet Take 1 tablet (50 mg total) by mouth 2 (two) times daily. 60 tablet 1   [START ON 03/29/2022] isosorbide mononitrate (IMDUR) 60 MG 24 hr tablet Take 1 tablet (60 mg total) by mouth daily. 30 tablet 1   OLANZapine zydis  (ZYPREXA) 5 MG disintegrating  tablet Dissolve 1 tablet (5 mg total) by mouth 2 (two) times daily. 60 tablet 1    Musculoskeletal: Strength & Muscle Tone:  unable to assess Gait & Station: Unable to assess Patient leans: Unable to assess    Psychiatric Specialty Exam:  Presentation  General Appearance:  Casual  Eye Contact: Fair  Speech: Clear and Coherent; Normal Rate  Speech Volume: Normal  Handedness: Right   Mood and Affect  Mood: Irritable  Affect: Congruent   Thought Process  Thought Processes: Coherent; Linear  Descriptions of Associations:Tangential  Orientation:Full (Time, Place and Person)  Thought Content:Tangential  History of Schizophrenia/Schizoaffective disorder:No data recorded Duration of Psychotic Symptoms:No data recorded Hallucinations:Hallucinations: None  Ideas of Reference:None  Suicidal Thoughts:Suicidal Thoughts: No  Homicidal Thoughts:Homicidal Thoughts: No   Sensorium  Memory: Immediate Poor; Recent Fair; Remote Poor  Judgment: Poor  Insight: Poor   Executive Functions  Concentration: Fair  Attention Span: Fair  Recall: Poor  Fund of Knowledge: Fair  Language: Fair   Psychomotor Activity  Psychomotor Activity: Psychomotor Activity: Normal   Assets  Assets: Communication Skills   Sleep  Sleep: Sleep: Fair   Physical Exam: Physical Exam Vitals and nursing note reviewed.  Constitutional:      General: She is sleeping.     Appearance: She is obese.  Neurological:     General: No focal deficit present.     Mental Status: She is oriented to person, place, and time and easily aroused. Mental status is at baseline.  Psychiatric:        Attention and Perception: Perception normal. She is inattentive.        Mood and Affect: Affect is labile.        Speech: Speech normal.        Behavior: Behavior is uncooperative.        Thought Content: Thought content normal.        Judgment:  Judgment is impulsive and inappropriate.    Review of Systems  Reason unable to perform ROS: patient declined.   Blood pressure 133/81, pulse 72, temperature 98.1 F (36.7 C), temperature source Oral, resp. rate 20, height 5\' 7"  (1.702 m), weight 104.9 kg, last menstrual period 05/10/2013, SpO2 98 %. Body mass index is 36.22 kg/m. Erin Riley is a 72 y.o. female with CHF exacerbation likely secondary to medication noncompliance. Patient endorses delusional thoughts and paranoia. She is irritable and labile in affect. She is tangential in thought process. Recommend resuming her psychotropic medication at this time. There are concerns about the safety efficacy of olanzapine.   Patient is a 72 year old female with serious cardiac conditions to include severe aortic stenosis and systolic/diastolic congestive heart failure.  Patient also has diagnosis of paranoid schizophrenia with an extensive history of multiple antipsychotic medication trials.  Zyprexa has been proven to achieve and maintain her psychiatric conditions, when compliant.  She continues to benefit from community support at a higher level to include group home and/or nursing facility.  Recommend family begin working at this time for placement and will transition to group home.  Treatment Plan Summary: Medication management and Plan    -Zyprexa 10 mg has been reduced to Zyprexa 5 mg p.o. twice daily due to underlying cardiac conditions.  Will attempt to reach out to cardiology to confirm safety of higher doses of olanzapine in a patient with severe acute stenosis in chronic systolic/diastolic heart failure. Patient will also benefit from legal guardian as she continues to display inability to manage self  and psychiatric illness, making her less compliant with treatment that result in hospitalization and worsening cardiac conditions. Patient will benefit from ACT team, information placed in AVS for strategic interventions. -EKG reviewed  from March 25, 2022 QTc corrected by Federica valued at 427.   Psychiatry consult service will continue to follow for the duration of this hospitalization.   Disposition: No evidence of imminent risk to self or others at present.   Patient does not meet criteria for psychiatric inpatient admission. Supportive therapy provided about ongoing stressors. Discussed crisis plan, support from social network, calling 911, coming to the Emergency Department, and calling Suicide Hotline.  Suella Broad, FNP 03/28/2022 5:24 PM

## 2022-03-28 NOTE — Discharge Summary (Signed)
Physician Discharge Summary  Erin Riley WJX:[id number] DOB: [birth year] DOA: 03/25/2022  PCP: Charlott Rakes, MD  Admit date: 03/25/2022 Discharge date: 03/28/2022  Time spent: 60 minutes  Recommendations for Outpatient Follow-up:  Follow-up with Marvis Moeller, NP cardiology 04/05/2022 at 8:25 AM. Follow-up with Charlott Rakes, MD in 1 to 2 weeks.  On follow-up patient will need a basic metabolic profile in the to follow-up on electrolytes and renal function.  Patient's blood pressure need to be reassessed and followed up upon.  Compliance with medications will also need to be followed up upon. Follow-up with [facility] in 1 day to schedule appointment with psychiatry for ongoing medication management. Follow-up with ACT team/strategic interventions in 1 day.   Discharge Diagnoses:  Principal Problem:   Acute on chronic combined systolic (congestive) and diastolic (congestive) heart failure (HCC) Active Problems:   Severe aortic stenosis   Hypertensive urgency   Essential hypertension   Paranoid schizophrenia (HCC)   CKD (chronic kidney disease) stage 3, GFR 30-59 ml/min (HCC)   Hyperlipidemia with target LDL less than 100   Acute on chronic combined systolic and diastolic CHF (congestive heart failure) (HCC)   Normocytic anemia   Hypertensive emergency   Discharge Condition: Stable and improved.  Diet recommendation: Heart healthy  Filed Weights   03/26/22 0337 03/27/22 0502 03/28/22 0501  Weight: 104.4 kg 105.1 kg 104.9 kg    History of present illness:  HPI per Dr. Phillips Climes is a 72 y.o. female with medical history significant of schizophrenia, HTN, HLD, chronic combined HF, CKD3a. Difficult historian as her history is changing with further questioning. Essentially, she has had several days of increased cough and mucus. She has become short of breath and has noticed some swelling in her legs. She reports some orthopnea. She states that she has not taken  her regular medications because she was pregnant since last year and insists she has papers to prove it. She denies chest pain, palpitations, fevers, or sick contacts. When her symptoms did not improve today, she decided to come to the ED for evaluation.    Hospital Course:  #1 acute on chronic combined CHF exacerbation ??  Etiology.  Per RN patient also noted with some dietary indiscretions and noncompliance with medications over the past year.  Patient also noted to have severe aortic stenosis on 2D echo. -Patient presented with shortness of breath, increased cough, lower extremity edema, orthopnea. -Chest x-ray done concerning for volume overload/vascular congestion.  BNP noted to be elevated at 1356.3. -Patient also noted to be in hypertensive urgency on admission.   -First set of troponin at 44. -Patient improved clinically with IV diuretics.. -Bump in creatinine as such IV diuretics of Lasix discontinued/held per cardiology. -Patient initially maintained on Imdur 60 mg daily, hydralazine 50 mg twice daily, aspirin, Lipitor, Norvasc, spironolactone.. -Norvasc discontinued per cardiology and patient started on low-dose Coreg. -Spironolactone also discontinued with bump in renal function. -Patient supposed to be on medications but has not taken in over a year. -2D echo done with EF of 25 to 30%, severe aortic stenosis, left ventricular demonstrates global hypokinesis, severe LVH, grade 2 diastolic dysfunction.  Mildly dilated left atrial size.  Severe aortic valve stenosis. -Dietitian consulted.  -Due to significantly abnormal 2D echo cardiology consulted and followed the patient during the hospitalization.   -Outpatient follow-up with cardiology.    2.  Severe aortic stenosis -Noted on 2D echo. -Cardiology consulted and assessed the patient for the management will be  challenging in the setting of patient schizophrenia and patient likely at this time not a candidate for either TAVR or open  repair due to mental illness. -Patient maintained on cardiac medications.   -Outpatient follow-up with cardiology.   -Psychiatry consulted.    3.  Hyperlipidemia -Fasting lipid panel with an LDL of 92.  -Patient maintained on statin.    4.  Schizophrenia -Patient with severe AS, presented with acute CHF exacerbation with depressed EF of 25 to 30%.  Difficult to manage patient's severe aortic stenosis in the setting of underlying mental illness. -Zyprexa resumed at 10 mg twice daily.   -Psychiatry consulted, recommending decreasing Zyprexa to 5 mg twice daily in view of cardiac issues.   -Patient cleared by psychiatry for discharge and patient will be set up with outpatient psychiatric follow-up.    5.  Hypertensive urgency -On admission patient noted to have systolic blood pressures in the 200s. -Patient noted not to have been on any medications over the past year however patient started on Imdur 60 mg daily, hydralazine 50 mg twice daily, Norvasc 5 mg daily, spironolactone 12.5 mg daily patient on IV Lasix.   -Patient seen by cardiology Coreg resumed at a lower dose of 3.125 mg twice daily.  Norvasc discontinued.  -Blood pressure improved and was well-controlled by day of discharge on Imdur 60 mg daily, hydralazine 50 mg twice daily, Coreg 3.125 mg daily.  Lasix will be resumed at 40 mg orally daily on discharge. -Outpatient follow-up with PCP.   6.  AKI on CKD stage IIIa -Creatinine bumped up to 1.56.   -Diuretics were held with improvement with renal function.   -Diuretics will be resumed orally Lasix 40 mg daily per cardiology on discharge.   -Outpatient follow-up with PCP.    7.  Normocytic anemia -Hemoglobin at 10.3. -Patient with no overt bleeding. -Outpatient follow-up.      Procedures: 2D echo 03/26/2022 Chest x-ray 03/25/2022  Consultations: Cardiology: Dr. Anne Fu 03/26/2022 Psychiatry  Discharge Exam: Vitals:   03/27/22 2002 03/28/22 0501  BP: 119/89 133/81   Pulse: 90 72  Resp: 19 20  Temp: 97.6 F (36.4 C) 98.1 F (36.7 C)  SpO2: 99% 98%    General: NAD Cardiovascular: Regular rate rhythm with 3/6 SEM.  No JVD, no murmurs rubs or gallops.  No lower extremity edema. Respiratory: Clear to auscultation bilaterally.  No wheezes, no crackles, no rhonchi.  Fair air movement.  Speaking in full sentences.  Discharge Instructions   Discharge Instructions     Diet - low sodium heart healthy   Complete by: As directed    Increase activity slowly   Complete by: As directed       Allergies as of 03/28/2022   No Known Allergies      Medication List     STOP taking these medications    amLODipine 10 MG tablet Commonly known as: NORVASC   ciclopirox 8 % solution Commonly known as: PENLAC   spironolactone 25 MG tablet Commonly known as: ALDACTONE       TAKE these medications    aspirin EC 81 MG tablet Take 1 tablet (81 mg total) by mouth daily. Swallow whole.   atorvastatin 40 MG tablet Commonly known as: LIPITOR Take 1 tablet (40 mg total) by mouth daily.   carvedilol 3.125 MG tablet Commonly known as: COREG Take 1 tablet (3.125 mg total) by mouth 2 (two) times daily with a meal. What changed:  medication strength how much to take  ferrous sulfate 325 (65 FE) MG EC tablet Take 325 mg by mouth daily with breakfast.   furosemide 40 MG tablet Commonly known as: LASIX Take 1 tablet (40 mg total) by mouth daily. What changed:  medication strength how much to take   hydrALAZINE 50 MG tablet Commonly known as: APRESOLINE Take 1 tablet (50 mg total) by mouth 2 (two) times daily. What changed:  medication strength how much to take how to take this when to take this   isosorbide mononitrate 60 MG 24 hr tablet Commonly known as: IMDUR Take 1 tablet (60 mg total) by mouth daily. Start taking on: March 29, 2022 What changed: how much to take   multivitamin with minerals tablet Take 1 tablet by mouth  daily.   OLANZapine zydis 5 MG disintegrating tablet Commonly known as: ZYPREXA Dissolve 1 tablet (5 mg total) by mouth 2 (two) times daily. What changed:  medication strength how much to take when to take this Another medication with the same name was removed. Continue taking this medication, and follow the directions you see here.   triamcinolone cream 0.1 % Commonly known as: KENALOG Apply 1 application topically 2 (two) times daily. Apply to rash on left thigh What changed:  when to take this reasons to take this       No Known Allergies  Follow-up Information     Deberah Pelton, NP Follow up on 04/05/2022.   Specialty: Cardiology Why: at 825am for your cardiology appt Contact information: [address] [place] [address] [phone]         Charlott Rakes, MD. Schedule an appointment as soon as possible for a visit in 1 week(s).   Specialty: Family Medicine Why: f/u in 1-2 weeks. Contact information: [address] [place] [address] [phone]         [facility] Call in 1 day(s).   Why: ACT team services Contact information: [address] [place] [address] [place], [place]. Call in 1 day(s).   Why: Schedule an appointment with psychiatry for ongoing medication management. Contact information: [place] [address] [phone]                  The results of significant diagnostics from this hospitalization (including imaging, microbiology, ancillary and laboratory) are listed below for reference.    Significant Diagnostic Studies: ECHOCARDIOGRAM COMPLETE  Result Date: 03/26/2022    ECHOCARDIOGRAM REPORT   Patient Name:   XITLALY AULT Rottmann Date of Exam: 03/26/2022 Medical Rec #:  [id number]   Height:       67.0 in Accession #:    [id number]  Weight:       230.2 lb Date of Birth:  1950/05/30   BSA:          2.147 m Patient  Age:    23 years    BP:           149/99 mmHg Patient Gender: F           HR:           78 bpm. Exam Location:  Inpatient Procedure: 2D Echo and Intracardiac Opacification Agent  Results communicated to Dr Grandville Silos at 13:05 on 03/26/22. Indications:     CHF  History:         Patient has prior history of Echocardiogram examinations, most  recent 05/14/2020. CHF, PAD; Risk Factors:Hypertension and                  Dyslipidemia.  Sonographer:     Cathie HoopsWendy Porter Referring Phys:  [id number] Teddy SpikeYRONE A KYLE Diagnosing Phys: Epifanio Lescheshristopher Schumann MD  Sonographer Comments: Patient is obese. Image acquisition challenging due to patient body habitus. IMPRESSIONS  1. Left ventricular ejection fraction, by estimation, is 25 to 30%. The left ventricle has severely decreased function. The left ventricle demonstrates global hypokinesis. There is severe left ventricular hypertrophy. Left ventricular diastolic parameters are consistent with Grade II diastolic dysfunction (pseudonormalization). Elevated left atrial pressure.  2. Right ventricular systolic function is mildly reduced. The right ventricular size is normal. There is normal pulmonary artery systolic pressure. The estimated right ventricular systolic pressure is 30.7 mmHg.  3. Left atrial size was mildly dilated.  4. The mitral valve is normal in structure. Mild mitral valve regurgitation. No evidence of mitral stenosis.  5. The inferior vena cava is dilated in size with >50% respiratory variability, suggesting right atrial pressure of 8 mmHg.  6. Cannot exclude a small PFO.  7. The aortic valve is tricuspid. There is severe calcifcation of the aortic valve. Aortic valve regurgitation is mild. Severe aortic valve stenosis. Vmax 3.8 m/s, MG 36 mmHg, AVA 0.7cm^2, DI 0.21 FINDINGS  Left Ventricle: Left ventricular ejection fraction, by estimation, is 25 to 30%. The left ventricle has severely decreased function. The left ventricle demonstrates global hypokinesis.  Definity contrast agent was given IV to delineate the left ventricular endocardial borders. The left ventricular internal cavity size was normal in size. There is severe left ventricular hypertrophy. Left ventricular diastolic parameters are consistent with Grade II diastolic dysfunction (pseudonormalization). Elevated left atrial pressure. Right Ventricle: The right ventricular size is normal. Right vetricular wall thickness was not well visualized. Right ventricular systolic function is mildly reduced. There is normal pulmonary artery systolic pressure. The tricuspid regurgitant velocity is 2.38 m/s, and with an assumed right atrial pressure of 8 mmHg, the estimated right ventricular systolic pressure is 30.7 mmHg. Left Atrium: Left atrial size was mildly dilated. Right Atrium: Right atrial size was normal in size. Pericardium: Trivial pericardial effusion is present. Mitral Valve: The mitral valve is normal in structure. Mild mitral valve regurgitation. No evidence of mitral valve stenosis. Tricuspid Valve: The tricuspid valve is normal in structure. Tricuspid valve regurgitation is mild. Aortic Valve: The aortic valve is tricuspid. There is severe calcifcation of the aortic valve. Aortic valve regurgitation is mild. Aortic regurgitation PHT measures 377 msec. Severe aortic stenosis is present. Aortic valve mean gradient measures 33.2 mmHg. Aortic valve peak gradient measures 46.0 mmHg. Pulmonic Valve: The pulmonic valve was normal in structure. Pulmonic valve regurgitation is trivial. Aorta: The aortic root is normal in size and structure. Venous: The inferior vena cava is dilated in size with greater than 50% respiratory variability, suggesting right atrial pressure of 8 mmHg. IAS/Shunts: Cannot exclude a small PFO.  LEFT VENTRICLE PLAX 2D LVIDd:         5.40 cm      Diastology LVIDs:         4.80 cm      LV e' medial:    3.70 cm/s LV PW:         2.30 cm      LV E/e' medial:  22.5 LV IVS:        1.20 cm      LV  e' lateral:   4.79 cm/s  LV E/e' lateral: 17.3  LV Volumes (MOD) LV vol d, MOD A2C: 213.0 ml LV vol d, MOD A4C: 176.0 ml LV vol s, MOD A2C: 165.0 ml LV vol s, MOD A4C: 140.0 ml LV SV MOD A2C:     48.0 ml LV SV MOD A4C:     176.0 ml LV SV MOD BP:      45.4 ml RIGHT VENTRICLE RV Basal diam:  4.10 cm RV Mid diam:    2.80 cm RV S prime:     9.14 cm/s TAPSE (M-mode): 1.8 cm LEFT ATRIUM              Index        RIGHT ATRIUM           Index LA diam:        4.60 cm  2.14 cm/m   RA Area:     17.60 cm LA Vol (A2C):   110.0 ml 51.24 ml/m  RA Volume:   40.90 ml  19.05 ml/m LA Vol (A4C):   48.8 ml  22.73 ml/m LA Biplane Vol: 74.1 ml  34.52 ml/m  AORTIC VALVE                    PULMONIC VALVE AV Vmax:           339.20 cm/s  PV Vmax:          0.99 m/s AV Vmean:          271.750 cm/s PV Peak grad:     3.9 mmHg AV VTI:            0.715 m      PR End Diast Vel: 9.24 msec AV Peak Grad:      46.0 mmHg AV Mean Grad:      33.2 mmHg LVOT Vmax:         75.05 cm/s LVOT Vmean:        54.500 cm/s LVOT VTI:          0.152 m LVOT/AV VTI ratio: 0.21 AI PHT:            377 msec MITRAL VALVE               TRICUSPID VALVE MV Area (PHT): 3.72 cm    TV Peak grad:   27.2 mmHg MV Decel Time: 204 msec    TV Vmax:        2.61 m/s MR Peak grad: 130.5 mmHg   TR Peak grad:   22.7 mmHg MR Mean grad: 104.0 mmHg   TR Vmax:        238.00 cm/s MR Vmax:      571.25 cm/s MR Vmean:     484.0 cm/s   SHUNTS MV E velocity: 83.10 cm/s  Systemic VTI: 0.15 m MV A velocity: 78.80 cm/s MV E/A ratio:  1.05 Epifanio Lescheshristopher Schumann MD Electronically signed by Epifanio Lescheshristopher Schumann MD Signature Date/Time: 03/26/2022/1:09:55 PM    Final (Updated)    DG Chest 2 View  Result Date: 03/25/2022 CLINICAL DATA:  Shortness of breath and congestion. EXAM: CHEST - 2 VIEW COMPARISON:  04/13/2021 FINDINGS: Low volume film. The cardio pericardial silhouette is enlarged. Vascular congestion noted with diffuse hazy bilateral airspace opacity. No substantial  pleural effusion status post right shoulder replacement. Telemetry leads overlie the chest. IMPRESSION: Low volume film with vascular congestion and diffuse hazy bilateral airspace opacity suggesting pulmonary edema. Electronically Signed   By: Kennith CenterEric  Mansell M.D.   On: 03/25/2022  13:43    Microbiology: Recent Results (from the past 240 hour(s))  Resp panel by RT-PCR (RSV, Flu A&B, Covid) Anterior Nasal Swab     Status: None   Collection Time: 03/25/22 12:31 PM   Specimen: Anterior Nasal Swab  Result Value Ref Range Status   SARS Coronavirus 2 by RT PCR NEGATIVE NEGATIVE Final    Comment: (NOTE) SARS-CoV-2 target nucleic acids are NOT DETECTED.  The SARS-CoV-2 RNA is generally detectable in upper respiratory specimens during the acute phase of infection. The lowest concentration of SARS-CoV-2 viral copies this assay can detect is 138 copies/mL. A negative result does not preclude SARS-Cov-2 infection and should not be used as the sole basis for treatment or other patient management decisions. A negative result may occur with  improper specimen collection/handling, submission of specimen other than nasopharyngeal swab, presence of viral mutation(s) within the areas targeted by this assay, and inadequate number of viral copies(<138 copies/mL). A negative result must be combined with clinical observations, patient history, and epidemiological information. The expected result is Negative.  Fact Sheet for Patients:  BloggerCourse.com  Fact Sheet for Healthcare Providers:  SeriousBroker.it  This test is no t yet approved or cleared by the [place] FDA and  has been authorized for detection and/or diagnosis of SARS-CoV-2 by FDA under an Emergency Use Authorization (EUA). This EUA will remain  in effect (meaning this test can be used) for the duration of the COVID-19 declaration under Section 564(b)(1) of the Act, 21 U.S.C.section  360bbb-3(b)(1), unless the authorization is terminated  or revoked sooner.       Influenza A by PCR NEGATIVE NEGATIVE Final   Influenza B by PCR NEGATIVE NEGATIVE Final    Comment: (NOTE) The Xpert Xpress SARS-CoV-2/FLU/RSV plus assay is intended as an aid in the diagnosis of influenza from Nasopharyngeal swab specimens and should not be used as a sole basis for treatment. Nasal washings and aspirates are unacceptable for Xpert Xpress SARS-CoV-2/FLU/RSV testing.  Fact Sheet for Patients: BloggerCourse.com  Fact Sheet for Healthcare Providers: SeriousBroker.it  This test is not yet approved or cleared by the [place] FDA and has been authorized for detection and/or diagnosis of SARS-CoV-2 by FDA under an Emergency Use Authorization (EUA). This EUA will remain in effect (meaning this test can be used) for the duration of the COVID-19 declaration under Section 564(b)(1) of the Act, 21 U.S.C. section 360bbb-3(b)(1), unless the authorization is terminated or revoked.     Resp Syncytial Virus by PCR NEGATIVE NEGATIVE Final    Comment: (NOTE) Fact Sheet for Patients: BloggerCourse.com  Fact Sheet for Healthcare Providers: SeriousBroker.it  This test is not yet approved or cleared by the [place] FDA and has been authorized for detection and/or diagnosis of SARS-CoV-2 by FDA under an Emergency Use Authorization (EUA). This EUA will remain in effect (meaning this test can be used) for the duration of the COVID-19 declaration under Section 564(b)(1) of the Act, 21 U.S.C. section 360bbb-3(b)(1), unless the authorization is terminated or revoked.  Performed at [facility], [address] [place], [place] [address]      Labs: Basic Metabolic Panel: Recent Labs  Lab 03/25/22 1246 03/26/22 0530 03/27/22 0442 03/28/22 0417  NA 138 138 139 137  K  3.6 3.8 3.7 3.8  CL 105 104 105 103  CO2 26 27 27 27   GLUCOSE 151* 87 99 97  BUN 21 20 30* 30*  CREATININE 1.07* 1.41* 1.56* 1.47*  CALCIUM 9.1 8.6* 9.3 9.1  MG  --   --  2.7* 2.6*   Liver Function Tests: Recent Labs  Lab 03/26/22 0530  AST 15  ALT 13  ALKPHOS 73  BILITOT 1.0  PROT 6.9  ALBUMIN 3.2*   No results for input(s): "LIPASE", "AMYLASE" in the last 168 hours. No results for input(s): "AMMONIA" in the last 168 hours. CBC: Recent Labs  Lab 03/25/22 1246 03/26/22 0530 03/27/22 0442  WBC 6.2 5.3 6.0  NEUTROABS 4.9  --   --   HGB 9.7* 9.0* 10.3*  HCT 31.3* 28.9* 32.9*  MCV 95.7 94.4 94.3  PLT 272 265 322   Cardiac Enzymes: No results for input(s): "CKTOTAL", "CKMB", "CKMBINDEX", "TROPONINI" in the last 168 hours. BNP: BNP (last 3 results) Recent Labs    03/25/22 1356  BNP 1,356.3*    ProBNP (last 3 results) No results for input(s): "PROBNP" in the last 8760 hours.  CBG: Recent Labs  Lab 03/26/22 2128 03/27/22 0810 03/27/22 1155  GLUCAP 115* 100* 119*       Signed:  Ramiro Harvest MD.  [facility] 03/28/2022, 3:21 PM

## 2022-03-28 NOTE — TOC Progression Note (Signed)
Transition of Care [facility]) - Progression Note    Patient Details  Name: Erin Riley MRN: [id number] Date of Birth: 1950/04/20  Transition of Care [facility]) CM/SW [place], LCSW Phone Number: 03/28/2022, 10:21 AM  Clinical Narrative:     CSW attempted to speak with pt in regards to [facility] recommendations and medication assistance. Pt stated "I dont need anything from you."Pt's stated "I dont want anyone coming there."Pt became increasingly agitated. Pt requested CSW to leave room.        Expected Discharge Plan and Services                                               Social Determinants of Health (SDOH) Interventions SDOH Screenings   Food Insecurity: No Food Insecurity (03/25/2022)  Housing: Low Risk  (03/25/2022)  Transportation Needs: No Transportation Needs (03/25/2022)  Utilities: Not At Risk (03/25/2022)  Alcohol Screen: Low Risk  (12/17/2019)  Depression (PHQ2-9): Low Risk  (01/26/2021)  Tobacco Use: High Risk (03/25/2022)    Readmission Risk Interventions     No data to display

## 2022-03-28 NOTE — Progress Notes (Signed)
Discharge instructions given to patient. Medication regimen, follow-up appointments and transportation explained to patient. PIV taken out. Patient safely transported off unit to cab at main entrance by tech.

## 2022-03-29 ENCOUNTER — Telehealth: Payer: Self-pay

## 2022-03-29 NOTE — Telephone Encounter (Signed)
Transition Care Management Follow-up Telephone Call  Call completed with patient's son, [name].  He was not with the patient at the time of this call.  Date of discharge and from where: 03/28/2022, [facility] How have you been since you were released from the hospital? He said he has not seen his mother since she returned home yesterday.  She does not live with him and has her own apartment at [employer]. He thought it was ALF and no one was helping her with medications. I explained that [facility] is not assisted living,it is independent apartments and I am not sure if there is any additional support available for residents.  He said that it is very hard to reach anyone at the apartment office.   He said he has been trying to call his mother but she has not answered.  The only number in Epic for her is [name]'s number. He then provided me with her phone number : [phone]. I called her number and the message stated that the voicemail was not set up.  [name] was not aware of her medication changes or discharge instructions.  He said he will go to check on her today if he his not able to reach her.  I instructed him to review her AVS, including medication list and make sure that she has her new medications in addition to the ones she was supposed to be taking prior to her hospitalization.  He said he will check but he has tried to help her in the past and she has been resistant to help and has not been compliant with taking her meds.   Any questions or concerns? Yes- noted above regarding his mother's compliance   Items Reviewed: Did the pt receive and understand the discharge instructions provided?  [name] will need to review the AVS.  Medications obtained and verified?  [name] will need to reconcile medications and call this clinic with any questions.  Other? No  Any new allergies since your discharge? No  Dietary orders reviewed? No Do you have support at home?  She lives alone  and has been very resistant to any support at home   Altha and Equipment/Supplies: Were home health services ordered? no If so, what is the name of the agency? N/a  Has the agency set up a time to come to the patient's home? not applicable Were any new equipment or medical supplies ordered?  No What is the name of the medical supply agency? N/a Were you able to get the supplies/equipment? not applicable Do you have any questions related to the use of the equipment or supplies? No  Functional Questionnaire: (I = Independent and D = Dependent) ADLs: independent with personal care, ADLS.  Needs assistance with medication management if she is in agreement.   Follow up appointments reviewed:  PCP Hospital f/u appt confirmed? Yes  Scheduled to see Dr Redmond Pulling at [facility]- 04/11/2022.  The appointment had to be scheduled within the 2 weeks when [name] is available.   [facility] f/u appt confirmed? Yes  Scheduled to see cardiology - 04/05/2022.  Are transportation arrangements needed?  Family will need to transport her.  If their condition worsens, is the pt aware to call PCP or go to the Emergency Dept.? Yes Was the patient provided with contact information for the PCP's office or ED? Yes. I also provided him with the phone number for [facility].  Was to pt encouraged to call back with questions or concerns?  Yes

## 2022-04-03 NOTE — Progress Notes (Deleted)
Cardiology Clinic Note   Patient Name: Erin Riley Date of Encounter: 04/03/2022  Primary Care Provider:  Charlott Rakes, MD Primary Cardiologist:  Glenetta Hew, MD  Patient Profile    Erin Riley 71 year old female presents to the clinic today for follow-up evaluation of her essential hypertension and hypertrophic cardiomyopathy.  Past Medical History    Past Medical History:  Diagnosis Date   Chronic back pain    Chronic kidney disease (CKD), stage II (mild)    Class I-II   Coronary artery disease 04/2009   50% stenosis in the perforator of LAD; catheterization was for an abnormal Myoview in January 2000 showing anterior and inferolateral ischemia.   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 > 3 subsequent Echos show improved/normal EF with moderate to severe concentric LVH and diastolic dysfunction with LVOT/intracavitary gradient --> 06/2016: Severe LVH.  Vigorous EF, 65-70%.?? Gr 1 DD. Mild AS.   Hyperlipidemia    Hypertension    Hypertensive hypertrophic cardiomyopathy: NYHA class II:  Echo: Severe concentric LVH with LV OT gradient; essentially preserved EF with diastolic dysfunction [id number]   Echo 06/2016: Severe Concentric LVH. Vigorous EF 65-70%. ~ Gr I DD.    Mild aortic stenosis by prior echocardiography    Echo 06/2016: Mild AS (Mean Gradient 15 mmHg); has had prior Mod-Severe MR (not seen on current echo)   PAD (peripheral artery disease) ([place]) March 2013   Lower extremity Dopplers: R. SFA 50-60%, R. PTA proximally occluded with distal reconstitution;; L. common iliac ~50%, L. SFA 50-70% stenosis, L. PTA < 50%   Schizophrenia ([place])    Past Surgical History:  Procedure Laterality Date   BUNIONECTOMY     carotid doppler  05/29/2011   left bulb/prox ICA moderate amtfibrous plaque with no evidence significant reduction.,right bulb /proximal ICA normal  patency   lower extremity doppler  05/29/2011   right SFA 50% to 59% diameter reduction,right posterior tibal atreery occlusive disease,reconstituting distally, left common illiac<50%,left SFA 50 to70%,left post. tibial <50%   NM MYOCAR PERF WALL MOTION  03/2009   Persantine; EF 51%-both anterior and inferolateral ischemia   TRANSTHORACIC ECHOCARDIOGRAM  06/2016   Severe LVH.  Vigorous EF of 65-70%.  No RWMA. ~Only grade 1 diastolic dysfunction.  Mild aortic stenosis (mean gradient 15 mmHg)   TRANSTHORACIC ECHOCARDIOGRAM  07/2012   EF 50-55%; severe concentric LVH; only grade 1 diastolic dysfunction. Mild aortic sclerosis - with LVOT /intracavitary gradient of roughly 20 mmHg mean. Mild to moderately dilated LA;; previously reported MR not seen    Allergies  No Known Allergies  History of Present Illness    Erin Riley is a PMH of HTN, hypertrophic cardiomyopathy, peripheral arterial disease, systolic and diastolic dysfunction, mitral valve regurgitation, severe aortic stenosis, pneumonia, CKD stage III, obesity, hyperlipidemia, schizophrenia and normocytic anemia.   She was admitted to the hospital 03/25/2022 and discharged on 03/28/2022.  She noted increasing cough and mucus.  She became short of breath and noted some swelling in her legs.  She had not taken her regular medications due to thinking she was pregnant.  She reported that she "had papers to prove it".  She denied chest pain, palpitations, fevers and sick contacts.  She was diagnosed with acute on chronic combined CHF.  She reported dietary indiscretion and noncompliance with her medications over the previous year.  Her echocardiogram showed severe  aortic stenosis.  EF 25-30%, G2 DD, mild mitral valve regurgitation.  BNP was elevated at 1356.3.  She improved with IV diuresis.  She had a elevation in her creatinine which was felt to be related to IV diuresis.  Diuresis was discontinued.  Her Norvasc was also discontinued by cardiology  and she was started on low-dose carvedilol.  Spironolactone was discontinued due to renal function.  She was discharged on Imdur, hydralazine, aspirin, furosemide, Lipitor and instructed to follow-up with cardiology.  Psychiatry was consulted for evaluation and management of her schizophrenia.  She was continued on Zyprexa which was reduced from 10 mg to 5 mg twice daily and instructed to follow-up with psychiatry.  She presents to the clinic today for follow-up evaluation and states***  *** denies chest pain, shortness of breath, lower extremity edema, fatigue, palpitations, melena, hematuria, hemoptysis, diaphoresis, weakness, presyncope, syncope, orthopnea, and PND.  Acute on chronic combined systolic and diastolic CHF-weight stable.  NYHA class II.  Euvolemic.  Admitted 03/25/2022 until 03/28/2022 due to dietary indiscretion and noncompliance with her medications.  Compliance complicated by patient's schizophrenia. Continue carvedilol, furosemide, hydralazine, Imdur, furosemide Heart healthy low-sodium diet-salty 6 given Increase physical activity as tolerated Daily weights contact office with a weight increase of 2 to 3 pounds overnight or 5 pounds in 1 week Order BMP  Essential hypertension-BP today***.  During admission systolic blood pressure was noted to be in the 200s.  She had not been compliant with her medications.  Her spironolactone and amlodipine were discontinued.  She was started on carvedilol. Continue carvedilol, Imdur, hydralazine, furosemide Maintain blood pressure log  Hyperlipidemia-03/27/2022: Cholesterol 147; HDL 37; LDL Cholesterol 92; Triglycerides 89; VLDL 18 Heart healthy low-sodium high-fiber diet Continue atorvastatin Increase physical activity as tolerated  Severe aortic stenosis-no increased DOE or activity intolerance.  Recent echocardiogram noted severe aortic stenosis.  EF 25-30%.  Due to patient's schizophrenia and comorbidities she was not felt to be a good  candidate for TAVR.  Medication management was recommended. Continue current medical therapy  Schizophrenia-mood appropriate.  Alert and oriented x 3. Follows with psychiatry Continue Zyprexa  Disposition: Follow-up with Dr. Ellyn Hack or me in 1-2 months. Home Medications    Prior to Admission medications   Medication Sig Start Date End Date Taking? Authorizing Provider  aspirin EC 81 MG EC tablet Take 1 tablet (81 mg total) by mouth daily. Swallow whole. 05/21/20   Jeralyn Bennett, MD  atorvastatin (LIPITOR) 40 MG tablet Take 1 tablet (40 mg total) by mouth daily. 03/28/22   Eugenie Filler, MD  carvedilol (COREG) 3.125 MG tablet Take 1 tablet (3.125 mg total) by mouth 2 (two) times daily with a meal. 03/28/22   Eugenie Filler, MD  ferrous sulfate 325 (65 FE) MG EC tablet Take 325 mg by mouth daily with breakfast.    [provider]  furosemide (LASIX) 40 MG tablet Take 1 tablet (40 mg total) by mouth daily. 03/28/22   Eugenie Filler, MD  hydrALAZINE (APRESOLINE) 50 MG tablet Take 1 tablet (50 mg total) by mouth 2 (two) times daily. 03/28/22   Eugenie Filler, MD  isosorbide mononitrate (IMDUR) 60 MG 24 hr tablet Take 1 tablet (60 mg total) by mouth daily. 03/29/22   Eugenie Filler, MD  Multiple Vitamins-Minerals (MULTIVITAMIN WITH MINERALS) tablet Take 1 tablet by mouth daily.    [provider]  OLANZapine zydis (ZYPREXA) 5 MG disintegrating tablet Dissolve 1 tablet (5 mg total) by mouth 2 (two) times  daily. 03/28/22   Eugenie Filler, MD  triamcinolone cream (KENALOG) 0.1 % Apply 1 application topically 2 (two) times daily. Apply to rash on left thigh Patient taking differently: Apply 1 application  topically 2 (two) times daily as needed (rash). Apply to rash on left thigh 05/06/21   Ladell Pier, MD    Family History    Family History  Problem Relation Age of Onset   Hypertension Mother    Breast cancer Neg Hx    She indicated that her mother  is alive. She indicated that her father is deceased. She indicated that her son is alive. She indicated that both of her children are alive. She indicated that the status of her neg hx is unknown.  Social History    Social History   Socioeconomic History   Marital status: Single    Spouse name: Not on file   Number of children: 7   Years of education: Not on file   Highest education level: Not on file  Occupational History   Not on file  Tobacco Use   Smoking status: Some Days    Types: Cigarettes    Last attempt to quit: 05/14/2002    Years since quitting: 19.9   Smokeless tobacco: Never  Vaping Use   Vaping Use: Never used  Substance and Sexual Activity   Alcohol use: No   Drug use: No   Sexual activity: Yes    Birth control/protection: Post-menopausal, None  Other Topics Concern   Not on file  Social History Narrative   Now single mother of 2 with one grandchild. She quit smoking roughly 5 years ago and is not so since. She has also stopped drinking alcohol. She does try get routine exercise walking at least a mile 3-4 days a week.    She lives with her 33 year old mother. 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   Social Determinants of Health   Financial Resource Strain: Not on file  Food Insecurity: No Food Insecurity (03/25/2022)   Hunger Vital Sign    Worried About Running Out of Food in the Last Year: Never true    Ran Out of Food in the Last Year: Never true  Transportation Needs: No Transportation Needs (03/25/2022)   PRAPARE - Hydrologist (Medical): No    Lack of Transportation (Non-Medical): No  Physical Activity: Not on file  Stress: Not on file  Social Connections: Not on file  Intimate Partner Violence: Not At Risk (03/25/2022)   Humiliation, Afraid, Rape, and Kick questionnaire    Fear of Current or Ex-Partner: No    Emotionally Abused: No    Physically Abused: No    Sexually Abused: No     Review of Systems    General:  No  chills, fever, night sweats or weight changes.  Cardiovascular:  No chest pain, dyspnea on exertion, edema, orthopnea, palpitations, paroxysmal nocturnal dyspnea. Dermatological: No rash, lesions/masses Respiratory: No cough, dyspnea Urologic: No hematuria, dysuria Abdominal:   No nausea, vomiting, diarrhea, bright red blood per rectum, melena, or hematemesis Neurologic:  No visual changes, wkns, changes in mental status. All other systems reviewed and are otherwise negative except as noted above.  Physical Exam    VS:  LMP 05/10/2013  , BMI There is no height or weight on file to calculate BMI. GEN: Well nourished, well developed, in no acute distress. HEENT: normal. Neck: Supple, no JVD, carotid bruits, or masses. Cardiac: RRR, no murmurs, rubs, or gallops.  No clubbing, cyanosis, edema.  Radials/DP/PT 2+ and equal bilaterally.  Respiratory:  Respirations regular and unlabored, clear to auscultation bilaterally. GI: Soft, nontender, nondistended, BS + x 4. MS: no deformity or atrophy. Skin: warm and dry, no rash. Neuro:  Strength and sensation are intact. Psych: Normal affect.  Accessory Clinical Findings    Recent Labs: 03/25/2022: B Natriuretic Peptide 1,356.3 03/26/2022: ALT 13 03/27/2022: Hemoglobin 10.3; Platelets 322 03/28/2022: BUN 30; Creatinine, Ser 1.47; Magnesium 2.6; Potassium 3.8; Sodium 137   Recent Lipid Panel    Component Value Date/Time   CHOL 147 03/27/2022 0442   CHOL 158 02/15/2021 1048   TRIG 89 03/27/2022 0442   HDL 37 (L) 03/27/2022 0442   HDL 38 (L) 02/15/2021 1048   CHOLHDL 4.0 03/27/2022 0442   VLDL 18 03/27/2022 0442   LDLCALC 92 03/27/2022 0442   LDLCALC 101 (H) 02/15/2021 1048    No BP recorded.  {Refresh Note OR Click here to enter BP  :1}***    ECG personally reviewed by me today- *** - No acute changes  Echocardiogram 03/26/2022 IMPRESSIONS     1. Left ventricular ejection fraction, by estimation, is 25 to 30%. The  left ventricle has  severely decreased function. The left ventricle  demonstrates global hypokinesis. There is severe left ventricular  hypertrophy. Left ventricular diastolic  parameters are consistent with Grade II diastolic dysfunction  (pseudonormalization). Elevated left atrial pressure.   2. Right ventricular systolic function is mildly reduced. The right  ventricular size is normal. There is normal pulmonary artery systolic  pressure. The estimated right ventricular systolic pressure is 99991111 mmHg.   3. Left atrial size was mildly dilated.   4. The mitral valve is normal in structure. Mild mitral valve  regurgitation. No evidence of mitral stenosis.   5. The inferior vena cava is dilated in size with >50% respiratory  variability, suggesting right atrial pressure of 8 mmHg.   6. Cannot exclude a small PFO.   7. The aortic valve is tricuspid. There is severe calcifcation of the  aortic valve. Aortic valve regurgitation is mild. Severe aortic valve  stenosis. Vmax 3.8 m/s, MG 36 mmHg, AVA 0.7cm^2, DI 0.21   FINDINGS   Left Ventricle: Left ventricular ejection fraction, by estimation, is 25  to 30%. The left ventricle has severely decreased function. The left  ventricle demonstrates global hypokinesis. Definity contrast agent was  given IV to delineate the left  ventricular endocardial borders. The left ventricular internal cavity size  was normal in size. There is severe left ventricular hypertrophy. Left  ventricular diastolic parameters are consistent with Grade II diastolic  dysfunction (pseudonormalization).  Elevated left atrial pressure.   Right Ventricle: The right ventricular size is normal. Right vetricular  wall thickness was not well visualized. Right ventricular systolic  function is mildly reduced. There is normal pulmonary artery systolic  pressure. The tricuspid regurgitant velocity  is 2.38 m/s, and with an assumed right atrial pressure of 8 mmHg, the  estimated right ventricular  systolic pressure is 99991111 mmHg.   Left Atrium: Left atrial size was mildly dilated.   Right Atrium: Right atrial size was normal in size.   Pericardium: Trivial pericardial effusion is present.   Mitral Valve: The mitral valve is normal in structure. Mild mitral valve  regurgitation. No evidence of mitral valve stenosis.   Tricuspid Valve: The tricuspid valve is normal in structure. Tricuspid  valve regurgitation is mild.   Aortic Valve: The aortic valve is tricuspid. There is  severe calcifcation  of the aortic valve. Aortic valve regurgitation is mild. Aortic  regurgitation PHT measures 377 msec. Severe aortic stenosis is present.  Aortic valve mean gradient measures 33.2  mmHg. Aortic valve peak gradient measures 46.0 mmHg.   Pulmonic Valve: The pulmonic valve was normal in structure. Pulmonic valve  regurgitation is trivial.   Aorta: The aortic root is normal in size and structure.   Venous: The inferior vena cava is dilated in size with greater than 50%  respiratory variability, suggesting right atrial pressure of 8 mmHg.   IAS/Shunts: Cannot exclude a small PFO.   Assessment & Plan   1.  ***   Jossie Ng. Skeeter Sheard NP-C     04/03/2022, 12:50 PM [facility] [address] Office [phone] Fax [phone]    I spent***minutes examining this patient, reviewing medications, and using patient centered shared decision making involving her cardiac care.  Prior to her visit I spent greater than 20 minutes reviewing her past medical history,  medications, and prior cardiac tests.

## 2022-04-05 ENCOUNTER — Ambulatory Visit: Payer: 59 | Attending: General Practice | Admitting: General Practice

## 2022-04-11 ENCOUNTER — Inpatient Hospital Stay: Payer: 59 | Admitting: Family Medicine

## 2022-04-11 ENCOUNTER — Other Ambulatory Visit (HOSPITAL_COMMUNITY): Payer: Self-pay

## 2022-04-27 ENCOUNTER — Other Ambulatory Visit (HOSPITAL_COMMUNITY): Payer: Self-pay

## 2022-04-27 ENCOUNTER — Other Ambulatory Visit: Payer: Self-pay

## 2022-04-28 ENCOUNTER — Other Ambulatory Visit: Payer: Self-pay

## 2022-05-17 ENCOUNTER — Other Ambulatory Visit (HOSPITAL_COMMUNITY): Payer: Self-pay

## 2022-05-23 ENCOUNTER — Telehealth: Payer: Self-pay | Admitting: Family Medicine

## 2022-05-23 NOTE — Telephone Encounter (Signed)
Called patient to schedule Medicare Annual Wellness Visit (AWV). Left message for patient to call back and schedule Medicare Annual Wellness Visit (AWV).  Last date of AWV: due awvi 03/06/09 per [place]    If any questions, please contact me at [phone].  Thank you ,  Timarion Agcaoili [facility] AWV direct phone # [phone] 

## 2022-05-29 ENCOUNTER — Other Ambulatory Visit (HOSPITAL_COMMUNITY): Payer: Self-pay

## 2022-06-04 ENCOUNTER — Emergency Department (HOSPITAL_COMMUNITY): Payer: 59

## 2022-06-04 ENCOUNTER — Emergency Department (HOSPITAL_COMMUNITY)
Admission: EM | Admit: 2022-06-04 | Discharge: 2022-06-06 | Disposition: A | Discharge status: Other Acute Inpatient Hospital | Payer: 59 | Attending: Emergency Medicine | Admitting: Emergency Medicine

## 2022-06-04 DIAGNOSIS — I10 Essential (primary) hypertension: Secondary | ICD-10-CM | POA: Insufficient documentation

## 2022-06-04 DIAGNOSIS — Z7982 Long term (current) use of aspirin: Secondary | ICD-10-CM | POA: Insufficient documentation

## 2022-06-04 DIAGNOSIS — M542 Cervicalgia: Secondary | ICD-10-CM

## 2022-06-04 DIAGNOSIS — F200 Paranoid schizophrenia: Secondary | ICD-10-CM | POA: Diagnosis not present

## 2022-06-04 DIAGNOSIS — W208XXA Other cause of strike by thrown, projected or falling object, initial encounter: Secondary | ICD-10-CM | POA: Insufficient documentation

## 2022-06-04 DIAGNOSIS — I159 Secondary hypertension, unspecified: Secondary | ICD-10-CM | POA: Diagnosis not present

## 2022-06-04 DIAGNOSIS — Z20822 Contact with and (suspected) exposure to covid-19: Secondary | ICD-10-CM | POA: Diagnosis not present

## 2022-06-04 DIAGNOSIS — Z79899 Other long term (current) drug therapy: Secondary | ICD-10-CM | POA: Insufficient documentation

## 2022-06-04 DIAGNOSIS — F209 Schizophrenia, unspecified: Secondary | ICD-10-CM

## 2022-06-04 LAB — COMPREHENSIVE METABOLIC PANEL
ALT: 13 U/L (ref 0–44)
AST: 21 U/L (ref 15–41)
Albumin: 3.1 g/dL — ABNORMAL LOW (ref 3.5–5.0)
Alkaline Phosphatase: 76 U/L (ref 38–126)
Anion gap: 10 (ref 5–15)
BUN: 23 mg/dL (ref 8–23)
CO2: 22 mmol/L (ref 22–32)
Calcium: 9.2 mg/dL (ref 8.9–10.3)
Chloride: 107 mmol/L (ref 98–111)
Creatinine, Ser: 1.76 mg/dL — ABNORMAL HIGH (ref 0.44–1.00)
GFR, Estimated: 31 mL/min — ABNORMAL LOW (ref >60)
Glucose, Bld: 104 mg/dL — ABNORMAL HIGH (ref 70–99)
Potassium: 3.5 mmol/L (ref 3.5–5.1)
Sodium: 139 mmol/L (ref 135–145)
Total Bilirubin: 0.4 mg/dL (ref 0.3–1.2)
Total Protein: 7 g/dL (ref 6.5–8.1)

## 2022-06-04 LAB — CBC WITH DIFFERENTIAL/PLATELET
Abs Immature Granulocytes: 0.01 10*3/uL (ref 0.00–0.07)
Basophils Absolute: 0 10*3/uL (ref 0.0–0.1)
Basophils Relative: 0 %
Eosinophils Absolute: 0.1 10*3/uL (ref 0.0–0.5)
Eosinophils Relative: 3 %
HCT: 38.4 % (ref 36.0–46.0)
Hemoglobin: 12.4 g/dL (ref 12.0–15.0)
Immature Granulocytes: 0 %
Lymphocytes Relative: 41 %
Lymphs Abs: 1.8 10*3/uL (ref 0.7–4.0)
MCH: 30.2 pg (ref 26.0–34.0)
MCHC: 32.3 g/dL (ref 30.0–36.0)
MCV: 93.4 fL (ref 80.0–100.0)
Monocytes Absolute: 0.4 10*3/uL (ref 0.1–1.0)
Monocytes Relative: 8 %
Neutro Abs: 2.1 10*3/uL (ref 1.7–7.7)
Neutrophils Relative %: 48 %
Platelets: 236 10*3/uL (ref 150–400)
RBC: 4.11 MIL/uL (ref 3.87–5.11)
RDW: 14.4 % (ref 11.5–15.5)
WBC: 4.4 10*3/uL (ref 4.0–10.5)
nRBC: 0 % (ref 0.0–0.2)

## 2022-06-04 LAB — URINALYSIS, COMPLETE (UACMP) WITH MICROSCOPIC
Bacteria, UA: NONE SEEN
Bilirubin Urine: NEGATIVE
Glucose, UA: NEGATIVE mg/dL
Hgb urine dipstick: NEGATIVE
Ketones, ur: NEGATIVE mg/dL
Leukocytes,Ua: NEGATIVE
Nitrite: NEGATIVE
Protein, ur: NEGATIVE mg/dL
Specific Gravity, Urine: 1.006 (ref 1.005–1.030)
pH: 5 (ref 5.0–8.0)

## 2022-06-04 LAB — RAPID URINE DRUG SCREEN, HOSP PERFORMED
Amphetamines: NOT DETECTED
Barbiturates: NOT DETECTED
Benzodiazepines: NOT DETECTED
Cocaine: NOT DETECTED
Opiates: NOT DETECTED
Tetrahydrocannabinol: NOT DETECTED

## 2022-06-04 LAB — SALICYLATE LEVEL: Salicylate Lvl: <7 mg/dL — ABNORMAL LOW (ref 7.0–30.0)

## 2022-06-04 LAB — ETHANOL: Alcohol, Ethyl (B): <10 mg/dL (ref <10)

## 2022-06-04 LAB — ACETAMINOPHEN LEVEL: Acetaminophen (Tylenol), Serum: <10 ug/mL — ABNORMAL LOW (ref 10–30)

## 2022-06-04 MED ORDER — LORAZEPAM 1 MG PO TABS
1.0000 mg | ORAL_TABLET | Freq: Four times a day (QID) | ORAL | Status: DC | PRN
  Administered 2022-06-05 – 2022-06-06 (×2): 1 mg via ORAL
  Filled 2022-06-04 (×2): qty 1

## 2022-06-04 MED ORDER — ZIPRASIDONE MESYLATE 20 MG IM SOLR: 20.0000 mg | INTRAMUSCULAR | Status: DC | PRN

## 2022-06-04 MED ORDER — HYDRALAZINE HCL 25 MG PO TABS
50.0000 mg | ORAL_TABLET | Freq: Once | ORAL | Status: AC
  Administered 2022-06-04: 50 mg via ORAL
  Filled 2022-06-04: qty 2

## 2022-06-04 MED ORDER — CARVEDILOL 3.125 MG PO TABS
3.1250 mg | ORAL_TABLET | Freq: Once | ORAL | Status: AC
  Administered 2022-06-04: 3.125 mg via ORAL
  Filled 2022-06-04: qty 1

## 2022-06-04 MED ORDER — ISOSORBIDE MONONITRATE ER 30 MG PO TB24: 60.0000 mg | ORAL_TABLET | Freq: Every day | ORAL | Status: DC

## 2022-06-04 MED ORDER — LACTATED RINGERS IV BOLUS: 1000.0000 mL | Freq: Once | INTRAVENOUS | Status: DC

## 2022-06-04 MED ORDER — HYDRALAZINE HCL 20 MG/ML IJ SOLN: 10.0000 mg | Freq: Once | INTRAMUSCULAR | Status: DC

## 2022-06-04 MED ORDER — CARVEDILOL 3.125 MG PO TABS
3.1250 mg | ORAL_TABLET | Freq: Two times a day (BID) | ORAL | Status: DC
  Administered 2022-06-04 – 2022-06-06 (×4): 3.125 mg via ORAL
  Filled 2022-06-04 (×4): qty 1

## 2022-06-04 MED ORDER — OLANZAPINE 5 MG PO TBDP
5.0000 mg | ORAL_TABLET | Freq: Two times a day (BID) | ORAL | Status: DC
  Administered 2022-06-04 – 2022-06-06 (×4): 5 mg via ORAL
  Filled 2022-06-04 (×4): qty 1

## 2022-06-04 MED ORDER — FUROSEMIDE 10 MG/ML IJ SOLN: 20.0000 mg | Freq: Once | INTRAMUSCULAR | Status: DC

## 2022-06-04 MED ORDER — FUROSEMIDE 20 MG PO TABS
40.0000 mg | ORAL_TABLET | Freq: Once | ORAL | Status: AC
  Administered 2022-06-04: 40 mg via ORAL
  Filled 2022-06-04: qty 2

## 2022-06-04 MED ORDER — OLANZAPINE 10 MG IM SOLR: 10.0000 mg | Freq: Three times a day (TID) | INTRAMUSCULAR | Status: DC | PRN

## 2022-06-04 MED ORDER — HYDRALAZINE HCL 50 MG PO TABS
50.0000 mg | ORAL_TABLET | Freq: Two times a day (BID) | ORAL | Status: DC
  Administered 2022-06-04 – 2022-06-06 (×4): 50 mg via ORAL
  Filled 2022-06-04 (×3): qty 1
  Filled 2022-06-04: qty 2

## 2022-06-04 MED ORDER — FUROSEMIDE 20 MG PO TABS
40.0000 mg | ORAL_TABLET | Freq: Every day | ORAL | Status: DC
  Administered 2022-06-05 – 2022-06-06 (×2): 40 mg via ORAL
  Filled 2022-06-04 (×2): qty 2

## 2022-06-04 MED ORDER — ISOSORBIDE MONONITRATE ER 30 MG PO TB24
60.0000 mg | ORAL_TABLET | Freq: Every day | ORAL | Status: DC
  Administered 2022-06-04 – 2022-06-06 (×3): 60 mg via ORAL
  Filled 2022-06-04 (×3): qty 2

## 2022-06-04 MED ORDER — ATORVASTATIN CALCIUM 40 MG PO TABS
40.0000 mg | ORAL_TABLET | Freq: Every day | ORAL | Status: DC
  Administered 2022-06-04 – 2022-06-05 (×2): 40 mg via ORAL
  Filled 2022-06-04 (×2): qty 1

## 2022-06-04 MED ORDER — LORAZEPAM 2 MG/ML IJ SOLN: 1.0000 mg | Freq: Four times a day (QID) | INTRAMUSCULAR | Status: DC | PRN
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
## 2022-06-04 NOTE — ED Provider Notes (Signed)
Signout from [place] PA-C at shift change. Briefly, patient presents for delusional behavior.  Also noted to have very high blood pressure.  She had hospitalization in January for hypertensive urgency.  At that time blood pressure medications as followed: Blood pressure improved and was well-controlled by day of discharge on Imdur 60 mg daily, hydralazine 50 mg twice daily, Coreg 3.125 mg daily.  Lasix will be resumed at 40 mg orally daily on discharge.   Plan: Continue blood pressure management, TTS evaluation   6:28 PM TTS evaluation completed, patient uncooperative at this time.  They have ordered medications and will reattempt evaluation in the morning given that she is likely noncompliant with her Zyprexa.  Labs and imaging personally reviewed and interpreted including: CBC unremarkable; CMP demonstrated elevated creatinine at 1.76 otherwise unremarkable; negative ethanol, acetaminophen, salicylate; UDS negative.   Most current vital signs reviewed and are as follows: BP (!) 157/96   Pulse 76   Temp 97.7 F (36.5 C) (Oral)   Resp 18   Ht 5\' 7"  (1.702 m)   Wt 104.9 kg   LMP 05/10/2013   SpO2 96%   BMI 36.22 kg/m   Plan: Home medications have been ordered.  Blood pressure is much improved now.  Unless blood pressure becomes elevated again, patient is currently medically cleared.  Will await final psychiatry recommendations.  Patient remains IVC.     Carlisle Cater, PA-C 06/04/22 1830    Margette Fast, MD 06/07/22 [phone]

## 2022-06-04 NOTE — ED Notes (Signed)
IVC paperwork moved to yellow zone nurses station, handed to RN
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
## 2022-06-04 NOTE — ED Notes (Signed)
IVC finished, on purple clipboard in orange zone, IVCd: 06/04/22, EXP: 06/11/22

## 2022-06-04 NOTE — ED Provider Notes (Signed)
[place] EMERGENCY DEPARTMENT AT [facility] Provider Note   CSN: [id number] Arrival date & time: 06/04/22  2130     History  Chief Complaint  Patient presents with   Neck Pain    Erin Riley is a 72 y.o. female.  72 year old female presents today for concern of neck pain.  She states a plastic pipe found fell onto her neck from a doorsill.  She is concerned the plastic python injected drugs into her.  She is unsure what kind of drugs.  She denies SI, HI.  She does states she takes her medications daily.  She does not have the medication with her and does not recall the name.  She states it is a small clinic pill.  Denies chest pain, shortness of breath, or other complaints.  The history is provided by the patient. No language interpreter was used.       Home Medications Prior to Admission medications   Medication Sig Start Date End Date Taking? Authorizing Provider  aspirin EC 81 MG EC tablet Take 1 tablet (81 mg total) by mouth daily. Swallow whole. 05/21/20   Glenford Bayley, MD  atorvastatin (LIPITOR) 40 MG tablet Take 1 tablet (40 mg total) by mouth daily. 03/28/22   Rodolph Bong, MD  carvedilol (COREG) 3.125 MG tablet Take 1 tablet (3.125 mg total) by mouth 2 (two) times daily with a meal. 03/28/22   Rodolph Bong, MD  ferrous sulfate 325 (65 FE) MG EC tablet Take 325 mg by mouth daily with breakfast.    [provider]  furosemide (LASIX) 40 MG tablet Take 1 tablet (40 mg total) by mouth daily. 03/28/22   Rodolph Bong, MD  hydrALAZINE (APRESOLINE) 50 MG tablet Take 1 tablet (50 mg total) by mouth 2 (two) times daily. 03/28/22   Rodolph Bong, MD  isosorbide mononitrate (IMDUR) 60 MG 24 hr tablet Take 1 tablet (60 mg total) by mouth daily. 03/29/22   Rodolph Bong, MD  Multiple Vitamins-Minerals (MULTIVITAMIN WITH MINERALS) tablet Take 1 tablet by mouth daily.    [provider]  OLANZapine zydis (ZYPREXA) 5 MG  disintegrating tablet Dissolve 1 tablet (5 mg total) by mouth 2 (two) times daily. 03/28/22   Rodolph Bong, MD  triamcinolone cream (KENALOG) 0.1 % Apply 1 application topically 2 (two) times daily. Apply to rash on left thigh Patient taking differently: Apply 1 application  topically 2 (two) times daily as needed (rash). Apply to rash on left thigh 05/06/21   Marcine Matar, MD      Allergies    Patient has no known allergies.    Review of Systems   Review of Systems  Constitutional:  Negative for chills and fever.  Cardiovascular:  Negative for chest pain.  Gastrointestinal:  Negative for abdominal pain.  Musculoskeletal:  Positive for neck pain. Negative for arthralgias and neck stiffness.  Psychiatric/Behavioral:  Negative for suicidal ideas.   All other systems reviewed and are negative.   Physical Exam Updated Vital Signs BP (!) 220/110 (BP Location: Right Arm)   Pulse 98   Resp 18   Ht 5\' 7"  (1.702 m)   Wt 104.9 kg   LMP 05/10/2013   SpO2 95%   BMI 36.22 kg/m  Physical Exam Vitals and nursing note reviewed.  Constitutional:      General: She is not in acute distress.    Appearance: Normal appearance. She is not ill-appearing.  HENT:  Head: Normocephalic and atraumatic.     Nose: Nose normal.  Eyes:     Conjunctiva/sclera: Conjunctivae normal.  Pulmonary:     Effort: Pulmonary effort is normal. No respiratory distress.  Musculoskeletal:        General: No deformity.  Skin:    Findings: No rash.  Neurological:     Mental Status: She is alert.  Psychiatric:        Speech: Speech normal.        Thought Content: Thought content is delusional. Thought content does not include homicidal or suicidal ideation.     ED Results / Procedures / Treatments   Labs (all labs ordered are listed, but only abnormal results are displayed) Labs Reviewed  CBC WITH DIFFERENTIAL/PLATELET  ACETAMINOPHEN LEVEL  COMPREHENSIVE METABOLIC PANEL  ETHANOL  RAPID URINE  DRUG SCREEN, HOSP PERFORMED  SALICYLATE LEVEL    EKG None  Radiology No results found.  Procedures Procedures    Medications Ordered in ED Medications - No data to display  ED Course/ Medical Decision Making/ A&P                             Medical Decision Making Amount and/or Complexity of Data Reviewed Labs: ordered. Radiology: ordered.  Risk Prescription drug management.   72 year old female presents today for concern of being bit by a plastic pipe which she is concerned injected drugs into her.  Outside of this delusion she denies SI, HI, or AVH.  No bite marks appreciated.  Has good range of motion in the cervical spine.  Cervical spine without tenderness to palpation.  No indication for imaging at this point.  Will obtain labs and consult TTS after.  Patient is willing to stay for TTS consult at this point.  She is voluntary at this point.  Do not feel there is a need to hold her against her will at this point.  Patient continues to be delusional, somewhat agitated.  She is also very hypertensive.  She does have history of HFrEF with EF about 25%.  Will reorder her home meds particularly those latter guideline directed medical therapy from heart failure standpoint.  Will continue to monitor her blood pressure.  Patient is requesting to leave.  Refusing treatment.  I feel patient lacks capacity to make this decision.  Will IVC.  Patient at the end of my shift awaiting TTS consult and reevaluation of blood pressure.  Signed out to oncoming provider Josh PA-C.   Final Clinical Impression(s) / ED Diagnoses Final diagnoses:  Schizophrenia, unspecified type (HCC)  Neck pain  Secondary hypertension    Rx / DC Orders ED Discharge Orders     None         Marita Kansas, PA-C 06/04/22 1514    Mardene Sayer, MD 06/05/22 [phone]

## 2022-06-04 NOTE — Consult Note (Signed)
  Requested to ED RN a urinalysis and UDS be obtained and resulted prior to psychiatric assessment.   Results are pending.

## 2022-06-04 NOTE — ED Triage Notes (Signed)
Pt BIB EMS from home for neck pain due to a "plastic python falling on her neck." Also stated to neighbor when they opened the door to check on her that "Someone put drugs on her" Aox4.

## 2022-06-04 NOTE — ED Notes (Signed)
Assumed care of pt brought over by day shift staff . Pt is known schizophrenic who has not taken her meds recently and is being IVC'd. Pt currently delusional believes she is pregnant. Pt ambulates with steady gait to rr and back. Pt fed sack lunch and medicated per ordfers. Pt given warm blankets and lights dimmed to calm pt.

## 2022-06-04 NOTE — ED Notes (Signed)
Pt refused to let this nurse near her stating "get that baby away from me you've done enough I don't need your baby around me" Unable to give meds at this time

## 2022-06-05 ENCOUNTER — Other Ambulatory Visit: Payer: Self-pay

## 2022-06-05 ENCOUNTER — Encounter (HOSPITAL_COMMUNITY): Payer: Self-pay

## 2022-06-05 DIAGNOSIS — F200 Paranoid schizophrenia: Secondary | ICD-10-CM

## 2022-06-05 DIAGNOSIS — M542 Cervicalgia: Secondary | ICD-10-CM | POA: Diagnosis not present

## 2022-06-05 LAB — RESP PANEL BY RT-PCR (RSV, FLU A&B, COVID)  RVPGX2
Influenza A by PCR: NEGATIVE
Influenza B by PCR: NEGATIVE
Resp Syncytial Virus by PCR: NEGATIVE
SARS Coronavirus 2 by RT PCR: NEGATIVE

## 2022-06-05 NOTE — ED Notes (Signed)
Coleman NP at bedside. 

## 2022-06-05 NOTE — ED Notes (Signed)
Pt ambulated to rr and back . Bed re made pt asked to be covered with blankets. Pt throws blankets on floor stating someone was under blankets trying to get her. Pt sitting on side of bed going through tv channels and talking to herself

## 2022-06-05 NOTE — ED Notes (Signed)
Pt belongings located in locker #3. Belongings that were present upon this RN's discovery of belongings are as follows: 1 pair of shoes, 2 sets of keys, Driver's License/ID, [facility] [id number], Insurance card, pocketbook w/ cigs, lighter, comb, papers, photos.   No meds present.  Valuables locked up w/ security: 2 sets of keys, Driver's License/ID, [facility] [id number] and insurance card.  No clothing noted.

## 2022-06-05 NOTE — ED Notes (Signed)
Pt provided sack lunch crackers and juice

## 2022-06-05 NOTE — ED Notes (Signed)
Pt irritable, hyperverbal w/ loud speech, and intermittently uncooperative.

## 2022-06-05 NOTE — ED Notes (Signed)
Pt received for care at 1900.  Quietly resting in bed; respirations even and unlabored with no distress noted.  Pt in view of sitter with necessary precautions maintained.

## 2022-06-05 NOTE — ED Notes (Signed)
Sitter obtained VS. This RN was notified that pt would not keep her arm still while obtaining BP.

## 2022-06-05 NOTE — Consult Note (Signed)
[name]   Reason for Consult:  Eval Referring Physician:  [name] Patient Identification: Erin Riley MRN:  [id number] ED Chief Complaint: Paranoid schizophrenia  Diagnosis:  Principal Problem:   Paranoid schizophrenia   ED Assessment Time Calculation: Start Time: 0830 Stop Time: 0930 Total Time in Minutes (Assessment Completion): 60   HPI:   Erin Riley is a 72 y.o. female patient here due to concerns of neck pain. Pt reported a "plastic pipe found fell onto her neck from a doorsill. She is concerned the plastic python injected drugs into her. She is unsure what kind of drugs. She denies SI, HI. She does states she takes her medications daily. She does not have the medication with her and does not recall the name. "   Psychiatry attempted to assess yesterday evening, see note below Upon evaluation, patient is guarded, withholding, and not cooperative. Patient refused to answer any psychiatric questions. She continued to respond "I am not talking to you" "that is none of  your damn business" "I wont be talking to you today or any day." She would not elaborate on possible delusions of a snake in her home drugging her or indulge in any question answering.    Historically, patient does have history of schizoaffective disorder, and appears to have previous hospital presentations with delusions. In similar fashion, patient presented to [facility] ED in January of 2024 with delusions and medication noncompliance, and refused initial psychiatric assessment. She was started on Zyprexa 5 mg BID, and next day after medication she engaged in assessment. Per chart review patient does have history of medication noncompliance, it is likely she could not be taking scheduled zyprexa.    Plan:  - Zyprexa 5 mg po BID started, first dose tonight - due to refusing to participate in assessment, psychiatry will reevaluate in the morning to determine inpatient psychiatric need with hopes patient is more willing to  communicate  Subjective:   Pt seen this morning at [facility] for face to face psychiatric evaluation. Today, she is more willing to engage in assessment, however remains irritable and minimal. She is alert and oriented x4. She did receive dose of zyprexa 5 mg last night and this morning. She tells me there are snakes in her house, she showed me her arms to show me the snake bites on the arms. However, I did not note any snake bites/lacerations/wounds on her forearms. She stated "I didn't say they were drugging me. I am just saying you don't know where those snakes go I don't know what they did. Was I drugged?" I assured her that her UDS was negative. Pt denies suicidal or homicidal ideations. However these questions made her upset, and she did not want to answer further questions stating I was too much into her business and I needed to leave her alone.   Per chart review, patient does have significant history of schizophrenia. It appears she has history of medication noncompliance, resulting in relapse of psychosis and other medical issues. Pt historically is stabilized well on zyprexa, she already appears to have some improvements with two doses. Will recommend inpatient psychiatric treatment to further stabilize on medications. It appears patient is still experiencing visual hallucinations per nursing documentation, and displays little insight and impaired judgement. Pt being reviewed at [facility] for geri psych.   Past Psychiatric History:  Paranoid schizophrenia, medication noncompliance  Risk to Self or Others: Is the patient at risk to self? Yes Has the patient been a risk to  self in the past 6 months? No Has the patient been a risk to self within the distant past? No Is the patient a risk to others? Yes Has the patient been a risk to others in the past 6 months? No Has the patient been a risk to others within the distant past? No  [place] Scale:  [place] ED from 06/04/2022 in [facility]  Emergency Department at [facility] ED to Hosp-Admission (Discharged) from 03/25/2022 in [place] ED from 10/07/2021 in [facility] Emergency Department at [place] No Risk No Risk No Risk       Past Medical History:  Past Medical History:  Diagnosis Date   Chronic back pain    Chronic kidney disease (CKD), stage II (mild)    Class I-II   Coronary artery disease 04/2009   50% stenosis in the perforator of LAD; catheterization was for an abnormal Myoview in January 2000 showing anterior and inferolateral ischemia.   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 > 3 subsequent Echos show improved/normal EF with moderate to severe concentric LVH and diastolic dysfunction with LVOT/intracavitary gradient --> 06/2016: Severe LVH.  Vigorous EF, 65-70%.?? Gr 1 DD. Mild AS.   Hyperlipidemia    Hypertension    Hypertensive hypertrophic cardiomyopathy: NYHA class II:  Echo: Severe concentric LVH with LV OT gradient; essentially preserved EF with diastolic dysfunction [id number]   Echo 06/2016: Severe Concentric LVH. Vigorous EF 65-70%. ~ Gr I DD.    Mild aortic stenosis by prior echocardiography    Echo 06/2016: Mild AS (Mean Gradient 15 mmHg); has had prior Mod-Severe MR (not seen on current echo)   PAD (peripheral artery disease) March 2013   Lower extremity Dopplers: R. SFA 50-60%, R. PTA proximally occluded with distal reconstitution;; L. common iliac ~50%, L. SFA 50-70% stenosis, L. PTA < 50%   Schizophrenia     Past Surgical History:  Procedure Laterality Date   BUNIONECTOMY     carotid doppler  05/29/2011   left bulb/prox ICA moderate amtfibrous plaque with no evidence significant reduction.,right bulb /proximal ICA normal patency   lower extremity doppler  05/29/2011   right SFA 50% to 59% diameter  reduction,right posterior tibal atreery occlusive disease,reconstituting distally, left common illiac<50%,left SFA 50 to70%,left post. tibial <50%   NM MYOCAR PERF WALL MOTION  03/2009   Persantine; EF 51%-both anterior and inferolateral ischemia   TRANSTHORACIC ECHOCARDIOGRAM  06/2016   Severe LVH.  Vigorous EF of 65-70%.  No RWMA. ~Only grade 1 diastolic dysfunction.  Mild aortic stenosis (mean gradient 15 mmHg)   TRANSTHORACIC ECHOCARDIOGRAM  07/2012   EF 50-55%; severe concentric LVH; only grade 1 diastolic dysfunction. Mild aortic sclerosis - with LVOT /intracavitary gradient of roughly 20 mmHg mean. Mild to moderately dilated LA;; previously reported MR not seen   Family History:  Family History  Problem Relation Age of Onset   Hypertension Mother    Breast cancer Neg Hx    Social History:  Social History   Substance and Sexual Activity  Alcohol Use No     Social History   Substance and Sexual Activity  Drug Use No    Social History   Socioeconomic History   Marital status: Single    Spouse name: Not on file   Number of children: 7  Years of education: Not on file   Highest education level: Not on file  Occupational History   Not on file  Tobacco Use   Smoking status: Some Days    Types: Cigarettes    Last attempt to quit: 05/14/2002    Years since quitting: 20.0   Smokeless tobacco: Never  Vaping Use   Vaping Use: Never used  Substance and Sexual Activity   Alcohol use: No   Drug use: No   Sexual activity: Yes    Birth control/protection: Post-menopausal, None  Other Topics Concern   Not on file  Social History Narrative   Now single mother of 2 with one grandchild. She quit smoking roughly 5 years ago and is not so since. She has also stopped drinking alcohol. She does try get routine exercise walking at least a mile 3-4 days a week.    She lives with her 60 year old mother. 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   Social Determinants of Health   Financial  Resource Strain: Not on file  Food Insecurity: No Food Insecurity (03/25/2022)   Hunger Vital Sign    Worried About Running Out of Food in the Last Year: Never true    Ran Out of Food in the Last Year: Never true  Transportation Needs: No Transportation Needs (03/25/2022)   PRAPARE - Hydrologist (Medical): No    Lack of Transportation (Non-Medical): No  Physical Activity: Not on file  Stress: Not on file  Social Connections: Not on file    Allergies:  No Known Allergies  Labs:  Results for orders placed or performed during the hospital encounter of 06/04/22 (from the past 48 hour(s))  CBC with Differential/Platelet     Status: None   Collection Time: 06/04/22 10:20 AM  Result Value Ref Range   WBC 4.4 4.0 - 10.5 K/uL   RBC 4.11 3.87 - 5.11 MIL/uL   Hemoglobin 12.4 12.0 - 15.0 g/dL   HCT 38.4 36.0 - 46.0 %   MCV 93.4 80.0 - 100.0 fL   MCH 30.2 26.0 - 34.0 pg   MCHC 32.3 30.0 - 36.0 g/dL   RDW 14.4 11.5 - 15.5 %   Platelets 236 150 - 400 K/uL   nRBC 0.0 0.0 - 0.2 %   Neutrophils Relative % 48 %   Neutro Abs 2.1 1.7 - 7.7 K/uL   Lymphocytes Relative 41 %   Lymphs Abs 1.8 0.7 - 4.0 K/uL   Monocytes Relative 8 %   Monocytes Absolute 0.4 0.1 - 1.0 K/uL   Eosinophils Relative 3 %   Eosinophils Absolute 0.1 0.0 - 0.5 K/uL   Basophils Relative 0 %   Basophils Absolute 0.0 0.0 - 0.1 K/uL   Immature Granulocytes 0 %   Abs Immature Granulocytes 0.01 0.00 - 0.07 K/uL    Comment: Performed at [facility], [address], [place], [place] [address]  Acetaminophen level     Status: Abnormal   Collection Time: 06/04/22 10:20 AM  Result Value Ref Range   Acetaminophen (Tylenol), Serum <10 (L) 10 - 30 ug/mL    Comment: (NOTE) Therapeutic concentrations vary significantly. A range of 10-30 ug/mL  may be an effective concentration for many patients. However, some  are best treated at concentrations outside of this range. Acetaminophen concentrations  >150 ug/mL at 4 hours after ingestion  and >50 ug/mL at 12 hours after ingestion are often associated with  toxic reactions.  Performed at [facility], [address], [place], [place] [address]   Comprehensive metabolic panel     Status: Abnormal   Collection Time: 06/04/22 10:20 AM  Result Value Ref Range   Sodium 139 135 - 145 mmol/L   Potassium 3.5 3.5 - 5.1 mmol/L   Chloride 107 98 - 111 mmol/L   CO2 22 22 - 32 mmol/L   Glucose, Bld 104 (H) 70 - 99 mg/dL    Comment: Glucose reference range applies only to samples taken after fasting for at least 8 hours.   BUN 23 8 - 23 mg/dL   Creatinine, Ser 1.76 (H) 0.44 - 1.00 mg/dL   Calcium 9.2 8.9 - 10.3 mg/dL   Total Protein 7.0 6.5 - 8.1 g/dL   Albumin 3.1 (L) 3.5 - 5.0 g/dL   AST 21 15 - 41 U/L   ALT 13 0 - 44 U/L   Alkaline Phosphatase 76 38 - 126 U/L   Total Bilirubin 0.4 0.3 - 1.2 mg/dL   GFR, Estimated 31 (L) >60 mL/min    Comment: (NOTE) Calculated using the CKD-EPI Creatinine Equation (2021)    Anion gap 10 5 - 15    Comment: Performed at [facility] [address] [place], [place] [address]  Ethanol     Status: None   Collection Time: 06/04/22 10:20 AM  Result Value Ref Range   Alcohol, Ethyl (B) <10 <10 mg/dL    Comment: (NOTE) Lowest detectable limit for serum alcohol is 10 mg/dL.  For medical purposes only. Performed at [facility], [place] [address], [place], [place] [id number]   Salicylate level     Status: Abnormal   Collection Time: 06/04/22 10:20 AM  Result Value Ref Range   Salicylate Lvl [id number] (L) 7.0 - 30.0 mg/dL    Comment: Performed at [facility] [address] [place], [place] [address]  Rapid urine drug screen (hospital performed)     Status: None   Collection Time: 06/04/22  5:17 PM  Result Value Ref Range   Opiates NONE DETECTED NONE DETECTED   Cocaine NONE DETECTED NONE DETECTED   Benzodiazepines NONE DETECTED NONE DETECTED   Amphetamines NONE DETECTED NONE  DETECTED   Tetrahydrocannabinol NONE DETECTED NONE DETECTED   Barbiturates NONE DETECTED NONE DETECTED    Comment: (NOTE) DRUG SCREEN FOR MEDICAL PURPOSES ONLY.  IF CONFIRMATION IS NEEDED FOR ANY PURPOSE, NOTIFY LAB WITHIN 5 DAYS.  LOWEST DETECTABLE LIMITS FOR URINE DRUG SCREEN Drug Class                     Cutoff (ng/mL) Amphetamine and metabolites    1000 Barbiturate and metabolites    200 Benzodiazepine                 200 Opiates and metabolites        300 Cocaine and metabolites        300 THC                            50 Performed at [facility], [place] [address] [place], [place] [address]   Urinalysis, Complete w Microscopic -Urine, Clean Catch     Status: Abnormal   Collection Time: 06/04/22  5:17 PM  Result Value Ref Range   Color, Urine YELLOW YELLOW   APPearance HAZY (A) CLEAR   Specific Gravity, Urine 1.006 1.005 - 1.030   pH 5.0 5.0 -  8.0   Glucose, UA NEGATIVE NEGATIVE mg/dL   Hgb urine dipstick NEGATIVE NEGATIVE   Bilirubin Urine NEGATIVE NEGATIVE   Ketones, ur NEGATIVE NEGATIVE mg/dL   Protein, ur NEGATIVE NEGATIVE mg/dL   Nitrite NEGATIVE NEGATIVE   Leukocytes,Ua NEGATIVE NEGATIVE   RBC / HPF 0-5 0 - 5 RBC/hpf   WBC, UA 0-5 0 - 5 WBC/hpf   Bacteria, UA NONE SEEN NONE SEEN   Squamous Epithelial / HPF 0-5 0 - 5 /HPF   Mucus PRESENT     Comment: Performed at [facility], [place] [address], [place], [place] [address]    Current Facility-Administered Medications  Medication Dose Route Frequency Provider Last Rate Last Admin   atorvastatin (LIPITOR) tablet 40 mg  40 mg Oral QHS Ali, Amjad, PA-C   40 mg at 06/04/22 2115   carvedilol (COREG) tablet 3.125 mg  3.125 mg Oral BID WC Deatra Canter, Amjad, PA-C   3.125 mg at 06/05/22 0730   furosemide (LASIX) tablet 40 mg  40 mg Oral Daily Evlyn Courier, PA-C   40 mg at 06/05/22 0730   hydrALAZINE (APRESOLINE) injection 10 mg  10 mg Intravenous Once Evlyn Courier, PA-C       hydrALAZINE (APRESOLINE) tablet 50 mg  50 mg  Oral BID Deatra Canter, Amjad, PA-C   50 mg at 06/05/22 [id number]   isosorbide mononitrate (IMDUR) 24 hr tablet 60 mg  60 mg Oral Daily Georgina Snell C, MD   60 mg at 06/05/22 0729   LORazepam (ATIVAN) tablet 1 mg  1 mg Oral Q6H PRN Vesta Mixer, NP   1 mg at 06/05/22 0730   Or   LORazepam (ATIVAN) injection 1 mg  1 mg Intramuscular Q6H PRN Vesta Mixer, NP       OLANZapine zydis (ZYPREXA) disintegrating tablet 5 mg  5 mg Oral BID Vesta Mixer, NP   5 mg at 06/05/22 0730   Current Outpatient Medications  Medication Sig Dispense Refill   atorvastatin (LIPITOR) 40 MG tablet Take 1 tablet (40 mg total) by mouth daily. 30 tablet 1   carvedilol (COREG) 3.125 MG tablet Take 1 tablet (3.125 mg total) by mouth 2 (two) times daily with a meal. 60 tablet 1   hydrALAZINE (APRESOLINE) 50 MG tablet Take 1 tablet (50 mg total) by mouth 2 (two) times daily. 60 tablet 1   isosorbide mononitrate (IMDUR) 60 MG 24 hr tablet Take 1 tablet (60 mg total) by mouth daily. 30 tablet 1   aspirin EC 81 MG EC tablet Take 1 tablet (81 mg total) by mouth daily. Swallow whole. (Patient not taking: Reported on 06/04/2022) 30 tablet 0   ferrous sulfate 325 (65 FE) MG EC tablet Take 325 mg by mouth daily with breakfast. (Patient not taking: Reported on 06/04/2022)     furosemide (LASIX) 40 MG tablet Take 1 tablet (40 mg total) by mouth daily. (Patient not taking: Reported on 06/04/2022) 30 tablet 1   Multiple Vitamins-Minerals (MULTIVITAMIN WITH MINERALS) tablet Take 1 tablet by mouth daily. (Patient not taking: Reported on 06/04/2022)     OLANZapine zydis (ZYPREXA) 5 MG disintegrating tablet Dissolve 1 tablet (5 mg total) by mouth 2 (two) times daily. (Patient not taking: Reported on 06/04/2022) 60 tablet 1   triamcinolone cream (KENALOG) 0.1 % Apply 1 application topically 2 (two) times daily. Apply to rash on left thigh (Patient not taking: Reported on 06/04/2022) 30 g 0   Psychiatric Specialty Exam: Presentation  General  Appearance:  Fairly Groomed  Eye  Contact: Fair  Speech: Pressured  Speech Volume: Normal  Handedness: Right   Mood and Affect  Mood: Irritable  Affect: Congruent   Thought Process  Thought Processes: Disorganized  Descriptions of Associations:Tangential  Orientation:Full (Time, Place and Person)  Thought Content:Tangential; Paranoid Ideation  History of Schizophrenia/Schizoaffective disorder:Yes  Duration of Psychotic Symptoms:Greater than six months  Hallucinations:Hallucinations: Visual  Ideas of Reference:Paranoia  Suicidal Thoughts:Suicidal Thoughts: No  Homicidal Thoughts:Homicidal Thoughts: No   Sensorium  Memory: Immediate Fair; Recent Fair  Judgment: Impaired  Insight: Poor   Executive Functions  Concentration: Fair  Attention Span: Fair  Recall: [place] of Knowledge: Fair  Language: Fair   Psychomotor Activity  Psychomotor Activity: Psychomotor Activity: Normal   Assets  Assets: Desire for Improvement; Leisure Time; Physical Health; Resilience; Social Support    Sleep  Sleep: Sleep: Fair   Physical Exam: Physical Exam Neurological:     Mental Status: She is alert and oriented to person, place, and time.  Psychiatric:        Attention and Perception: She is inattentive. She perceives visual hallucinations.        Mood and Affect: Affect is labile.        Speech: Speech is rapid and pressured.        Behavior: Behavior is withdrawn.        Thought Content: Thought content is paranoid and delusional.    Review of Systems  Psychiatric/Behavioral:  Positive for hallucinations.        Paranoid, delusions, visual hallucinations   Blood pressure (!) 175/107, pulse 87, temperature 98.2 F (36.8 C), temperature source Oral, resp. rate 18, height 5\' 7"  (1.702 m), weight 104.9 kg, last menstrual period 05/10/2013, SpO2 100 %. Body mass index is 36.22 kg/m.  Medical Decision Making: Pt case reviewed and  discussed with Dr. Dwyane Dee. Will recommend inpatient psychiatric admission at this time. [facility], if no availability will have CSW fax out.   - continue Zyprexa 5 mg BID  Disposition: Recommend psychiatric Inpatient admission when medically cleared.  Vesta Mixer, NP 06/05/2022 9:37 AM

## 2022-06-05 NOTE — Progress Notes (Signed)
LCSW Progress Note  [id number]   DAYANNE ELFERING  06/05/2022  10:04 AM  Description:   Inpatient Psychiatric Referral  Patient was recommended gero psych inpatient per Vesta Mixer, NP. There are no available beds at [facility] unit. Patient was referred to the following facilities:   Destination  Service Provider Address Phone Fax  [facility]  [address] [place] [address] [phone]  [place]  [address] [place] [phone] [place]  [place]  [address], [place] [address] [phone] [facility]  [address] [place], [place] [address] [phone]  [facility]  [address] [place] [address] [place]  [facility]  [address], [place] [address] [phone]  [place] [address], [place] [address] [id number]  [facility]  [address] [place] [address] [phone] [place]  [address], [place] [address] [phone] [facility]  [address], [place] [address] [phone] [facility]  [address] [place] [address] [place]  [address] [place] [address] [phone] [facility]  [address], [place] [address] [phone] [facility]  [address], [place] [address] [place] [facility]  [address], [place] [address] [phone] [place]  [address] [place] [address] [id number]   [facility]  [address] [place] [address] [phone] [address] [place] [address] [phone]  [facility]  [place], [place] [address] [phone]  [place]  [address], [place] [address] [phone] [facility]  [address], [place] [address] [phone]  [facility]  [address], [place] [address] [phone] [facility]  [place], [place] [address] [phone] [address]  [place], [place] [address] [phone] [facility]  [id number] [place] [address] [phone]    Situation ongoing, CSW to continue following and update chart as more information becomes available.      Denna Haggard, [place]  06/05/2022 10:04 AM

## 2022-06-05 NOTE — ED Notes (Signed)
Pt provided with juice and graham crackers.

## 2022-06-05 NOTE — ED Notes (Signed)
Sitter arrived to be at bedside was given report

## 2022-06-05 NOTE — ED Provider Notes (Signed)
Emergency Medicine Observation Re-evaluation Note  Erin Riley is a 72 y.o. female, seen on rounds today.  Pt initially presented to the ED for complaints of hx schizoaffective disorder, and ?paranoid type thoughts. Pt currently calm, denies any current c/o, states feels ready to go back to facility.   Physical Exam  BP (!) 175/107 (BP Location: Right Arm)   Pulse 87   Temp 98.2 F (36.8 C) (Oral)   Resp 18   Ht 1.702 m (5\' 7" )   Wt 104.9 kg   LMP 05/10/2013   SpO2 100%   BMI 36.22 kg/m  Physical Exam General: calm, no distress.  Cardiac: regular rate.  Lungs: breathing comfortably. Psych: normal mood and affect. Oriented. No thoughts of harm to self or others. Pt does not appear to be actively responding to internal stimuli.   ED Course / MDM    I have reviewed the labs performed to date as well as medications administered while in observation.  Recent changes in the last 24 hours include ED obs, med management, reassessment.   Plan  On chart review and current eval, it is not entirely clear how different from baseline pts recent presentation is - appears to have hx non-compliance w meds/zyprexa, and intermittent delusional type thoughts. No acute agitation or delirium noted currently.  BH re-eval this AM is pending. Dispo per [facility] team.       Lajean Saver, MD 06/05/22 [phone]

## 2022-06-06 ENCOUNTER — Inpatient Hospital Stay
Admission: AD | Admit: 2022-06-06 | Discharge: 2022-06-14 | DRG: 885 | Disposition: A | Discharge status: Home / Self Care | Payer: 59 | Source: Intra-hospital | Attending: Psychiatry | Admitting: Psychiatry

## 2022-06-06 ENCOUNTER — Encounter: Payer: Self-pay | Admitting: Psychiatry

## 2022-06-06 ENCOUNTER — Other Ambulatory Visit: Payer: Self-pay

## 2022-06-06 DIAGNOSIS — I5189 Other ill-defined heart diseases: Secondary | ICD-10-CM

## 2022-06-06 DIAGNOSIS — Z8249 Family history of ischemic heart disease and other diseases of the circulatory system: Secondary | ICD-10-CM | POA: Diagnosis not present

## 2022-06-06 DIAGNOSIS — M542 Cervicalgia: Secondary | ICD-10-CM | POA: Diagnosis not present

## 2022-06-06 DIAGNOSIS — I739 Peripheral vascular disease, unspecified: Secondary | ICD-10-CM | POA: Diagnosis present

## 2022-06-06 DIAGNOSIS — M549 Dorsalgia, unspecified: Secondary | ICD-10-CM | POA: Diagnosis present

## 2022-06-06 DIAGNOSIS — Z91148 Patient's other noncompliance with medication regimen for other reason: Secondary | ICD-10-CM

## 2022-06-06 DIAGNOSIS — I2510 Atherosclerotic heart disease of native coronary artery without angina pectoris: Secondary | ICD-10-CM | POA: Diagnosis present

## 2022-06-06 DIAGNOSIS — F17210 Nicotine dependence, cigarettes, uncomplicated: Secondary | ICD-10-CM | POA: Diagnosis present

## 2022-06-06 DIAGNOSIS — N182 Chronic kidney disease, stage 2 (mild): Secondary | ICD-10-CM | POA: Diagnosis present

## 2022-06-06 DIAGNOSIS — G8929 Other chronic pain: Secondary | ICD-10-CM | POA: Diagnosis present

## 2022-06-06 DIAGNOSIS — I350 Nonrheumatic aortic (valve) stenosis: Secondary | ICD-10-CM | POA: Diagnosis present

## 2022-06-06 DIAGNOSIS — Z7982 Long term (current) use of aspirin: Secondary | ICD-10-CM

## 2022-06-06 DIAGNOSIS — I129 Hypertensive chronic kidney disease with stage 1 through stage 4 chronic kidney disease, or unspecified chronic kidney disease: Secondary | ICD-10-CM | POA: Diagnosis present

## 2022-06-06 DIAGNOSIS — F201 Disorganized schizophrenia: Principal | ICD-10-CM | POA: Diagnosis present

## 2022-06-06 DIAGNOSIS — E785 Hyperlipidemia, unspecified: Secondary | ICD-10-CM | POA: Diagnosis present

## 2022-06-06 MED ORDER — ATORVASTATIN CALCIUM 10 MG PO TABS
40.0000 mg | ORAL_TABLET | Freq: Every day | ORAL | Status: DC
  Administered 2022-06-06 – 2022-06-13 (×8): 40 mg via ORAL
  Filled 2022-06-06 (×8): qty 4

## 2022-06-06 MED ORDER — CARVEDILOL 6.25 MG PO TABS
3.1250 mg | ORAL_TABLET | Freq: Two times a day (BID) | ORAL | Status: DC
  Administered 2022-06-07: 3.125 mg via ORAL
  Filled 2022-06-06: qty 1

## 2022-06-06 MED ORDER — ALUM & MAG HYDROXIDE-SIMETH 200-200-20 MG/5ML PO SUSP: 30.0000 mL | ORAL | Status: DC | PRN

## 2022-06-06 MED ORDER — LORAZEPAM 2 MG/ML IJ SOLN: 1.0000 mg | Freq: Four times a day (QID) | INTRAMUSCULAR | Status: DC | PRN

## 2022-06-06 MED ORDER — ISOSORBIDE MONONITRATE ER 30 MG PO TB24
60.0000 mg | ORAL_TABLET | Freq: Every day | ORAL | Status: DC
  Administered 2022-06-07 – 2022-06-14 (×8): 60 mg via ORAL
  Filled 2022-06-06 (×8): qty 2

## 2022-06-06 MED ORDER — ACETAMINOPHEN 325 MG PO TABS: 650.0000 mg | ORAL_TABLET | Freq: Four times a day (QID) | ORAL | Status: DC | PRN

## 2022-06-06 MED ORDER — HALOPERIDOL LACTATE 5 MG/ML IJ SOLN: 5.0000 mg | Freq: Four times a day (QID) | INTRAMUSCULAR | Status: DC | PRN

## 2022-06-06 MED ORDER — HALOPERIDOL 5 MG PO TABS
5.0000 mg | ORAL_TABLET | Freq: Four times a day (QID) | ORAL | Status: DC | PRN
  Administered 2022-06-07: 5 mg via ORAL
  Filled 2022-06-06: qty 1

## 2022-06-06 MED ORDER — LORAZEPAM 1 MG PO TABS
1.0000 mg | ORAL_TABLET | Freq: Four times a day (QID) | ORAL | Status: DC | PRN
  Administered 2022-06-07 – 2022-06-11 (×2): 1 mg via ORAL
  Filled 2022-06-06 (×2): qty 1

## 2022-06-06 MED ORDER — OLANZAPINE 5 MG PO TBDP
5.0000 mg | ORAL_TABLET | Freq: Two times a day (BID) | ORAL | Status: DC
  Administered 2022-06-06 – 2022-06-13 (×14): 5 mg via ORAL
  Filled 2022-06-06 (×14): qty 1

## 2022-06-06 MED ORDER — TRAZODONE HCL 100 MG PO TABS
100.0000 mg | ORAL_TABLET | Freq: Every evening | ORAL | Status: DC | PRN
  Administered 2022-06-11 – 2022-06-12 (×2): 100 mg via ORAL
  Filled 2022-06-06 (×2): qty 1

## 2022-06-06 MED ORDER — MAGNESIUM HYDROXIDE 400 MG/5ML PO SUSP: 30.0000 mL | Freq: Every day | ORAL | Status: DC | PRN

## 2022-06-06 MED ORDER — FUROSEMIDE 20 MG PO TABS
40.0000 mg | ORAL_TABLET | Freq: Every day | ORAL | Status: DC
  Administered 2022-06-07 – 2022-06-14 (×8): 40 mg via ORAL
  Filled 2022-06-06 (×8): qty 2

## 2022-06-06 MED ORDER — HYDRALAZINE HCL 25 MG PO TABS
50.0000 mg | ORAL_TABLET | Freq: Two times a day (BID) | ORAL | Status: DC
  Administered 2022-06-06 – 2022-06-14 (×16): 50 mg via ORAL
  Filled 2022-06-06 (×16): qty 2

## 2022-06-06 NOTE — Group Note (Signed)
Recreation Therapy Group Note   Group Topic:Health and Wellness  Group Date: 06/06/2022 Start Time:  2:00 PM End Time:  2:50 PM Facilitators: Vilma Prader, LRT, CTRS Location: Courtyard  Group Description: Outdoor Recreation. Patients had the option to do seated exercise or play with a deck of cards while outside in the courtyard getting fresh air and sunlight. LRT played music in the background on the speaker. LRT and pts discussed things that they enjoy doing in their free time outside of the hospital.  Affect/Mood: N/A   Participation Level: Did not attend    Clinical Observations/Individualized Feedback: Erin Riley did not attend group due to not being on the unit yet.   Plan: Continue to engage patient in RT group sessions 2-3x/week.   Vilma Prader, LRT, CTRS 06/06/2022 3:05 PM

## 2022-06-06 NOTE — Group Note (Signed)
Date:  06/06/2022 Time:  10:23 PM  Group Topic/Focus:  Coping With Mental Health Crisis:   The purpose of this group is to help patients identify strategies for coping with mental health crisis.  Group discusses possible causes of crisis and ways to manage them effectively.    Participation Level:  Active  Participation Quality:  Intrusive  Affect:  Excited  Cognitive:  Alert  Insight: Appropriate  Engagement in Group:  Distracting  Modes of Intervention:  Limit-setting  Additional Comments:    Meredeth Ide 06/06/2022, 10:23 PM

## 2022-06-06 NOTE — Group Note (Signed)
LCSW Group Therapy Note   Group Date: 06/06/2022 Start Time:  1:15 PM End Time:  2:05 PM   Type of Therapy and Topic:  Group Therapy: Challenging Core Beliefs  Participation Level:  Did Not Attend  Description of Group:  Patients were educated about core beliefs and asked to identify one harmful core belief that they have. Patients were asked to explore from where those beliefs originate. Patients were asked to discuss how those beliefs make them feel and the resulting behaviors of those beliefs. They were then be asked if those beliefs are true and, if so, what evidence they have to support them. Lastly, group members were challenged to replace those negative core beliefs with helpful beliefs.   Therapeutic Goals:   1. Patient will identify harmful core beliefs and explore the origins of such beliefs. 2. Patient will identify feelings and behaviors that result from those core beliefs. 3. Patient will discuss whether such beliefs are true. 4.  Patient will replace harmful core beliefs with helpful ones.  Summary of Patient Progress:   Patient not yet on unit when group was held.  Therapeutic Modalities: Cognitive Behavioral Therapy; Solution-Focused Therapy   Rozann Lesches, LCSWA 06/06/2022  3:12 PM

## 2022-06-06 NOTE — Progress Notes (Signed)
Patient admitted IVC to [facility] unit at approx 1600 from [facility] ED with diagnosis of schizophrenia. She told her neighbors that a plastic snake fell on top of her, gave her neck pain, and input drugs inside of her. Patient presents to the unit via a transport chair although she is ambulatory and walks with a limp. Denies needing a walker and using one at home. She is  A+O x 2 (person and situation). "I don't know where I am. You need to tell me. I have been to too many hospitals." Patient's affect is irritable, speech is hyper verbal, and thoughts are disorganized. She denies SI/HI/AVH. She denies anxiety and depression. Denies pain. Patient reports having a good appetite and was a little annoyed that we did not have steak on the menu.   Patient reports being a smoker and asks if this is a smoking facility. Patient reports living alone and that she is employed but is currently on maternity leave. She was unable to state whether she had any goals.   Emotional support and reassurance provided throughout admission intake. Patient was oriented to the unit, and where her bedroom and call light is. Dinner tray ordered. Denies any needs at this time.  Will continue to monitor with ongoing Q15 minute unit safety checks per protocol.

## 2022-06-06 NOTE — Plan of Care (Signed)
New admit, no time to progress.   Problem: Education: Goal: Knowledge of General Education information will improve Description: Including pain rating scale, medication(s)/side effects and non-pharmacologic comfort measures Outcome: Not Progressing   Problem: Health Behavior/Discharge Planning: Goal: Ability to manage health-related needs will improve Outcome: Not Progressing   Problem: Clinical Measurements: Goal: Ability to maintain clinical measurements within normal limits will improve Outcome: Not Progressing Goal: Will remain free from infection Outcome: Not Progressing Goal: Diagnostic test results will improve Outcome: Not Progressing Goal: Respiratory complications will improve Outcome: Not Progressing Goal: Cardiovascular complication will be avoided Outcome: Not Progressing   Problem: Activity: Goal: Risk for activity intolerance will decrease Outcome: Not Progressing   Problem: Nutrition: Goal: Adequate nutrition will be maintained Outcome: Not Progressing   Problem: Coping: Goal: Level of anxiety will decrease Outcome: Not Progressing   Problem: Elimination: Goal: Will not experience complications related to bowel motility Outcome: Not Progressing Goal: Will not experience complications related to urinary retention Outcome: Not Progressing   Problem: Self-Concept: Goal: Will verbalize positive feelings about self Outcome: Not Progressing   Problem: Self-Care: Goal: Ability to participate in self-care as condition permits will improve Outcome: Not Progressing   Problem: Safety: Goal: Ability to redirect hostility and anger into socially appropriate behaviors will improve Outcome: Not Progressing Goal: Ability to remain free from injury will improve Outcome: Not Progressing

## 2022-06-06 NOTE — Progress Notes (Signed)
Pt in room laughing loudly and talking to herself. Went to pt room and closed the door. "Don't lock my door." Pt reassured that her door was not being locked. Will continue to monitor.

## 2022-06-06 NOTE — ED Provider Notes (Signed)
Emergency Medicine Observation Re-evaluation Note  Erin Riley is a 72 y.o. female, seen on rounds today.  Pt initially presented to the ED for complaints of Neck Pain Currently, the patient is Sting quietly.  Physical Exam  BP (!) 159/71 (BP Location: Left Arm)   Pulse 78   Temp 97.6 F (36.4 C) (Oral)   Resp 18   Ht 5\' 7"  (1.702 m)   Wt 104.9 kg   LMP 05/10/2013   SpO2 100%   BMI 36.22 kg/m  Physical Exam General: No acute distress Cardiac: Well-perfused Lungs: Nonlabored Psych: Calm  ED Course / MDM  EKG:EKG Interpretation  Date/Time:  Sunday June 04 2022 11:27:40 EDT Ventricular Rate:  64 PR Interval:  196 QRS Duration: 102 QT Interval:  434 QTC Calculation: 447 R Axis:   -5 Text Interpretation: Normal sinus rhythm Moderate voltage criteria for LVH, may be normal variant ( R in aVL , Cornell product ) Nonspecific ST and T wave abnormality Abnormal ECG Improved from prior EKG When compared with ECG of 25-Mar-2022 12:38, PREVIOUS ECG IS PRESENT Confirmed by Georgina Snell [phone]) on 06/04/2022 11:54:53 AM  I have reviewed the labs performed to date as well as medications administered while in observation.  Recent changes in the last 24 hours include patient has been accepted to [facility].  Plan  Current plan is for transfer to [facility].    Hayden Rasmussen, MD 06/06/22 1213

## 2022-06-06 NOTE — Tx Team (Signed)
Initial Treatment Plan 06/06/2022 3:54 PM Erin Riley [id number]    PATIENT STRESSORS: Health problems   Medication change or noncompliance     PATIENT STRENGTHS: Active sense of humor  Capable of independent living  Motivation for treatment/growth    PATIENT IDENTIFIED PROBLEMS:   " I have been to too many hospitals."                   DISCHARGE CRITERIA:  Ability to meet basic life and health needs Adequate post-discharge living arrangements Improved stabilization in mood, thinking, and/or behavior Verbal commitment to aftercare and medication compliance  PRELIMINARY DISCHARGE PLAN: Attend aftercare/continuing care group Attend PHP/IOP Return to previous living arrangement  PATIENT/FAMILY INVOLVEMENT: This treatment plan has been presented to and reviewed with the patient, Erin Riley. The patient has been given the opportunity to ask questions and make suggestions.  Ileene Musa, RN 06/06/2022, 3:54 PM

## 2022-06-06 NOTE — ED Provider Notes (Addendum)
Emergency Medicine Observation Re-evaluation Note  Erin Riley is a 72 y.o. female, seen on rounds today.  Pt initially presented to the ED for complaints of Neck Pain Currently, the patient is awaiting psychiatric admission.  Physical Exam  BP (!) 159/71 (BP Location: Left Arm)   Pulse 78   Temp 97.6 F (36.4 C) (Oral)   Resp 18   Ht 5\' 7"  (1.702 m)   Wt 104.9 kg   LMP 05/10/2013   SpO2 100%   BMI 36.22 kg/m  Physical Exam Alert in no acute distress  ED Course / MDM  EKG:EKG Interpretation  Date/Time:  Sunday June 04 2022 11:27:40 EDT Ventricular Rate:  64 PR Interval:  196 QRS Duration: 102 QT Interval:  434 QTC Calculation: 447 R Axis:   -5 Text Interpretation: Normal sinus rhythm Moderate voltage criteria for LVH, may be normal variant ( R in aVL , Cornell product ) Nonspecific ST and T wave abnormality Abnormal ECG Improved from prior EKG When compared with ECG of 25-Mar-2022 12:38, PREVIOUS ECG IS PRESENT Confirmed by Georgina Snell [phone]) on 06/04/2022 11:54:53 AM  I have reviewed the labs performed to date as well as medications administered while in observation.  Recent changes in the last 24 hours include none.  Plan  Current plan is for psychiatric admission for paranoid delusions    Milton Ferguson, MD 06/06/22 1123    Milton Ferguson, MD 06/06/22 1123

## 2022-06-06 NOTE — ED Notes (Signed)
Pt ambulatory to restroom at this time.  Pt somewhat agitated and irritable with sitter.  Pt appearing anxious upon return to room, looking around room.  This RN to administer PRN medication at this time.

## 2022-06-06 NOTE — Progress Notes (Signed)
Pt was accepted to [place] 06/06/22; Bed Assignment L-32 IVC Paperwork to be Faxed to [phone].   Pt meets inpatient criteria per Vesta Mixer, NP  Attending Physician will be  Caren Griffins, DO  Report can be called to:-Adult unit: [phone]  Pt can arrive after [phone]  Care Team Notified: [facility] Lynnda Shields, RN, Aletta Edouard, MD, Vesta Mixer, NP, Maxie Better, RN, Hartley Barefoot, RN, Caren Griffins, DO, [place], LCSWA, Tawanna Sat, RN  [place], LCSWA 06/06/2022 @ 11:31 AM

## 2022-06-06 NOTE — ED Notes (Signed)
Called sheriff to tx pt to [facility]

## 2022-06-06 NOTE — Progress Notes (Signed)
   06/06/22 2000  Psych Admission Type (Psych Patients Only)  Admission Status Involuntary  Psychosocial Assessment  Patient Complaints Irritability  Eye Contact Brief  Facial Expression Animated  Affect Irritable  Speech Pressured  Interaction Assertive  Motor Activity Slow  Appearance/Hygiene In scrubs  Behavior Characteristics Cooperative;Anxious;Guarded  Mood Labile  Thought Process  Coherency WDL  Content Blaming others  Delusions None reported or observed  Perception WDL  Hallucination None reported or observed  Judgment Impaired  Confusion None  Danger to Self  Current suicidal ideation? Denies  Danger to Others  Danger to Others None reported or observed   Progress note   D: Pt seen in dayroom. Pt denies SI, HI, AVH. Pt rates pain  0/10. Pt rates anxiety  0/10 and depression  0/10. Pt sitting at table watching TV. Irritated by assessment questions. "I''m just sitting here. Do I look like I want to hurt myself?" Pt states she is here because of her sister. "She should be here. She keeps sending me here. I been dealing with this for 5 decades." Pt says she has 10 kids. Can be irritable at times but redirected and cooperates with what is asked. Pt A&Ox3. Pt participated in group and is eating a snack. No other concerns noted at this time.  A: Pt provided support and encouragement. Pt given scheduled medication as prescribed. PRNs as appropriate. Q15 min checks for safety.   R: Pt safe on the unit. Will continue to monitor.

## 2022-06-07 DIAGNOSIS — F201 Disorganized schizophrenia: Secondary | ICD-10-CM | POA: Diagnosis not present

## 2022-06-07 MED ORDER — CARVEDILOL 6.25 MG PO TABS
6.2500 mg | ORAL_TABLET | Freq: Two times a day (BID) | ORAL | Status: DC
  Administered 2022-06-07 – 2022-06-09 (×4): 6.25 mg via ORAL
  Filled 2022-06-07 (×4): qty 1

## 2022-06-07 MED ORDER — CLONIDINE HCL 0.1 MG PO TABS
0.2000 mg | ORAL_TABLET | Freq: Four times a day (QID) | ORAL | Status: DC | PRN
  Filled 2022-06-07: qty 2

## 2022-06-07 NOTE — H&P (Signed)
Psychiatric Admission Assessment Adult  Patient Identification: Erin Riley MRN:  [id number] Date of Evaluation:  06/07/2022 Chief Complaint:  Schizophrenia, disorganized, chronic with acute exacerbation [F20.1] Principal Diagnosis: Schizophrenia, disorganized, chronic with acute exacerbation Diagnosis:  Principal Problem:   Schizophrenia, disorganized, chronic with acute exacerbation  History of Present Illness: Erin Riley is a 72 year old African-American female with long history of schizophrenia who presented to the emergency room with a chief complaint of neck pain.  She was delusional and not caring for herself.  She has not been compliant with her medications and she was involuntarily committed.  She was seen on psychiatric consultation and was not cooperative on evaluation.  She denied all signs and symptoms including SI and HI.  She has a history of stabilizing on Zyprexa.  She denied alcohol or drug use.  She told the nurse practitioner that there were snakes in her house and that she has snake bites. Patient has been admitted to psychiatric hospital several times previously.  [facility].  Eventually stabilized on Zyprexa although it sounds like even at her best her baseline continued to be somewhat disorganized.  Since then she has been to the emergency room several times usually for medical complaints and on at least a couple of those has been referred to psychiatric hospitals.  No known history of suicide attempts or violence.  Looks like Zyprexa has been used on several occasions with some benefit.  Patient not willing to discuss any outpatient treatment not clear whether she goes for any.   Associated Signs/Symptoms: Depression Symptoms: Negative (Hypo) Manic Symptoms: Negative Anxiety Symptoms: Negative Psychotic Symptoms:  Delusions, PTSD Symptoms: Negative Total Time spent with patient: 1 hour  Past Psychiatric History: As above  Is the patient at  risk to self? Yes Has the patient been a risk to self in the past 6 months? Yes Has the patient been a risk to self within the distant past? Yes.    Is the patient a risk to others? No.  Has the patient been a risk to others in the past 6 months? No.  Has the patient been a risk to others within the distant past? No.   [place] Scale:  [place] Admission (Current) from 06/06/2022 in [place] ED from 06/04/2022 in [facility] Department at [facility] ED to Hosp-Admission (Discharged) from 03/25/2022 in [place] No Risk No Risk No Risk        Prior Inpatient Therapy: Yes.   If yes, describe as above Prior Outpatient Therapy: Yes.   If yes, describe as above  Alcohol Screening: 1. How often do you have a drink containing alcohol?: Never 2. How many drinks containing alcohol do you have on a typical day when you are drinking?: 1 or 2 3. How often do you have six or more drinks on one occasion?: Never AUDIT-C Score: 0 4. How often during the last year have you found that you were not able to stop drinking once you had started?: Never 5. How often during the last year have you failed to do what was normally expected from you because of drinking?: Never 6. How often during the last year have you needed a first drink in the morning to get yourself going after a heavy drinking session?: Never 7. How often during the last year have you had a feeling of guilt of remorse after drinking?: Never  8. How often during the last year have you been unable to remember what happened the night before because you had been drinking?: Never 9. Have you or someone else been injured as a result of your drinking?: No 10. Has a relative or friend or a doctor or another health worker been concerned about your drinking or suggested you cut down?: No Alcohol Use Disorder Identification Test Final Score (AUDIT):  0 Substance Abuse History in the last 12 months:  No. Consequences of Substance Abuse: NA Previous Psychotropic Medications: Yes  Psychological Evaluations: Yes  Past Medical History:  Past Medical History:  Diagnosis Date   Chronic back pain    Chronic kidney disease (CKD), stage II (mild)    Class I-II   Coronary artery disease 04/2009   50% stenosis in the perforator of LAD; catheterization was for an abnormal Myoview in January 2000 showing anterior and inferolateral ischemia.   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 > 3 subsequent Echos show improved/normal EF with moderate to severe concentric LVH and diastolic dysfunction with LVOT/intracavitary gradient --> 06/2016: Severe LVH.  Vigorous EF, 65-70%.?? Gr 1 DD. Mild AS.   Hyperlipidemia    Hypertension    Hypertensive hypertrophic cardiomyopathy: NYHA class II:  Echo: Severe concentric LVH with LV OT gradient; essentially preserved EF with diastolic dysfunction [id number]   Echo 06/2016: Severe Concentric LVH. Vigorous EF 65-70%. ~ Gr I DD.    Mild aortic stenosis by prior echocardiography    Echo 06/2016: Mild AS (Mean Gradient 15 mmHg); has had prior Mod-Severe MR (not seen on current echo)   PAD (peripheral artery disease) March 2013   Lower extremity Dopplers: R. SFA 50-60%, R. PTA proximally occluded with distal reconstitution;; L. common iliac ~50%, L. SFA 50-70% stenosis, L. PTA < 50%   Schizophrenia     Past Surgical History:  Procedure Laterality Date   BUNIONECTOMY     carotid doppler  05/29/2011   left bulb/prox ICA moderate amtfibrous plaque with no evidence significant reduction.,right bulb /proximal ICA normal patency   lower extremity doppler  05/29/2011   right SFA 50% to 59% diameter reduction,right posterior tibal atreery occlusive disease,reconstituting distally, left common illiac<50%,left SFA 50  to70%,left post. tibial <50%   NM MYOCAR PERF WALL MOTION  03/2009   Persantine; EF 51%-both anterior and inferolateral ischemia   TRANSTHORACIC ECHOCARDIOGRAM  06/2016   Severe LVH.  Vigorous EF of 65-70%.  No RWMA. ~Only grade 1 diastolic dysfunction.  Mild aortic stenosis (mean gradient 15 mmHg)   TRANSTHORACIC ECHOCARDIOGRAM  07/2012   EF 50-55%; severe concentric LVH; only grade 1 diastolic dysfunction. Mild aortic sclerosis - with LVOT /intracavitary gradient of roughly 20 mmHg mean. Mild to moderately dilated LA;; previously reported MR not seen   Family History:  Family History  Problem Relation Age of Onset   Hypertension Mother    Breast cancer Neg Hx    Family Psychiatric  History: Unremarkable Tobacco Screening:  Social History   Tobacco Use  Smoking Status Some Days   Types: Cigarettes   Last attempt to quit: 05/14/2002   Years since quitting: 20.0  Smokeless Tobacco Never    BH Tobacco Counseling     Are you interested in Tobacco Cessation Medications?  No, patient refused Counseled patient on smoking cessation:  Refused/Declined practical counseling Reason Tobacco Screening Not Completed: No value filed.  Social History:  Social History   Substance and Sexual Activity  Alcohol Use No     Social History   Substance and Sexual Activity  Drug Use No    Additional Social History:                           Allergies:  No Known Allergies Lab Results:  Results for orders placed or performed during the hospital encounter of 06/04/22 (from the past 48 hour(s))  Resp panel by RT-PCR (RSV, Flu A&B, Covid) Anterior Nasal Swab     Status: None   Collection Time: 06/05/22 11:03 AM   Specimen: Anterior Nasal Swab  Result Value Ref Range   SARS Coronavirus 2 by RT PCR NEGATIVE NEGATIVE   Influenza A by PCR NEGATIVE NEGATIVE   Influenza B by PCR NEGATIVE NEGATIVE    Comment: (NOTE) The Xpert Xpress SARS-CoV-2/FLU/RSV plus assay is intended as an  aid in the diagnosis of influenza from Nasopharyngeal swab specimens and should not be used as a sole basis for treatment. Nasal washings and aspirates are unacceptable for Xpert Xpress SARS-CoV-2/FLU/RSV testing.  Fact Sheet for Patients: EntrepreneurPulse.com.au  Fact Sheet for Healthcare Providers: IncredibleEmployment.be  This test is not yet approved or cleared by the [place] FDA and has been authorized for detection and/or diagnosis of SARS-CoV-2 by FDA under an Emergency Use Authorization (EUA). This EUA will remain in effect (meaning this test can be used) for the duration of the COVID-19 declaration under Section 564(b)(1) of the Act, 21 U.S.C. section 360bbb-3(b)(1), unless the authorization is terminated or revoked.     Resp Syncytial Virus by PCR NEGATIVE NEGATIVE    Comment: (NOTE) Fact Sheet for Patients: EntrepreneurPulse.com.au  Fact Sheet for Healthcare Providers: IncredibleEmployment.be  This test is not yet approved or cleared by the [place] FDA and has been authorized for detection and/or diagnosis of SARS-CoV-2 by FDA under an Emergency Use Authorization (EUA). This EUA will remain in effect (meaning this test can be used) for the duration of the COVID-19 declaration under Section 564(b)(1) of the Act, 21 U.S.C. section 360bbb-3(b)(1), unless the authorization is terminated or revoked.  Performed at [facility], [place] [address] [place], [place] [address]     Blood Alcohol level:  Lab Results  Component Value Date   ETH <10 06/04/2022   ETH <10 123XX123    Metabolic Disorder Labs:  Lab Results  Component Value Date   HGBA1C 5.5 02/15/2021   MPG 102.54 05/13/2020   MPG 123 12/18/2019   Lab Results  Component Value Date   PROLACTIN 27.3 (H) 12/18/2019   PROLACTIN 16.5 06/09/2016   Lab Results  Component Value Date   CHOL 147 03/27/2022   TRIG 89  03/27/2022   HDL 37 (L) 03/27/2022   CHOLHDL 4.0 03/27/2022   VLDL 18 03/27/2022   LDLCALC 92 03/27/2022   LDLCALC 101 (H) 02/15/2021    Current Medications: Current Facility-Administered Medications  Medication Dose Route Frequency Provider Last Rate Last Admin   acetaminophen (TYLENOL) tablet 650 mg  650 mg Oral Q6H PRN Parks Ranger, DO       alum & mag hydroxide-simeth (MAALOX/MYLANTA) 200-200-20 MG/5ML suspension 30 mL  30 mL Oral Q4H PRN Parks Ranger, DO       atorvastatin (LIPITOR) tablet 40 mg  40 mg Oral QHS Parks Ranger, DO   40 mg at 06/06/22 2100   carvedilol (COREG) tablet 6.25  mg  6.25 mg Oral BID WC Parks Ranger, DO       cloNIDine (CATAPRES) tablet 0.2 mg  0.2 mg Oral Q6H PRN Parks Ranger, DO       furosemide (LASIX) tablet 40 mg  40 mg Oral Daily Parks Ranger, DO   40 mg at 06/07/22 0800   haloperidol (HALDOL) tablet 5 mg  5 mg Oral Q6H PRN Parks Ranger, DO   5 mg at 06/07/22 0800   Or   haloperidol lactate (HALDOL) injection 5 mg  5 mg Intramuscular Q6H PRN Parks Ranger, DO       hydrALAZINE (APRESOLINE) tablet 50 mg  50 mg Oral BID Parks Ranger, DO   50 mg at 06/07/22 0800   isosorbide mononitrate (IMDUR) 24 hr tablet 60 mg  60 mg Oral Daily Parks Ranger, DO   60 mg at 06/07/22 0800   LORazepam (ATIVAN) tablet 1 mg  1 mg Oral Q6H PRN Parks Ranger, DO   1 mg at 06/07/22 0800   Or   LORazepam (ATIVAN) injection 1 mg  1 mg Intramuscular Q6H PRN Parks Ranger, DO       magnesium hydroxide (MILK OF MAGNESIA) suspension 30 mL  30 mL Oral Daily PRN Parks Ranger, DO       OLANZapine zydis (ZYPREXA) disintegrating tablet 5 mg  5 mg Oral BID Parks Ranger, DO   5 mg at 06/07/22 0800   traZODone (DESYREL) tablet 100 mg  100 mg Oral QHS PRN Parks Ranger, DO       PTA Medications: Medications Prior to Admission   Medication Sig Dispense Refill Last Dose   aspirin EC 81 MG EC tablet Take 1 tablet (81 mg total) by mouth daily. Swallow whole. (Patient not taking: Reported on 06/04/2022) 30 tablet 0    atorvastatin (LIPITOR) 40 MG tablet Take 1 tablet (40 mg total) by mouth daily. 30 tablet 1    carvedilol (COREG) 3.125 MG tablet Take 1 tablet (3.125 mg total) by mouth 2 (two) times daily with a meal. 60 tablet 1    ferrous sulfate 325 (65 FE) MG EC tablet Take 325 mg by mouth daily with breakfast. (Patient not taking: Reported on 06/04/2022)      furosemide (LASIX) 40 MG tablet Take 1 tablet (40 mg total) by mouth daily. (Patient not taking: Reported on 06/04/2022) 30 tablet 1    hydrALAZINE (APRESOLINE) 50 MG tablet Take 1 tablet (50 mg total) by mouth 2 (two) times daily. 60 tablet 1    isosorbide mononitrate (IMDUR) 60 MG 24 hr tablet Take 1 tablet (60 mg total) by mouth daily. 30 tablet 1    Multiple Vitamins-Minerals (MULTIVITAMIN WITH MINERALS) tablet Take 1 tablet by mouth daily. (Patient not taking: Reported on 06/04/2022)      OLANZapine zydis (ZYPREXA) 5 MG disintegrating tablet Dissolve 1 tablet (5 mg total) by mouth 2 (two) times daily. (Patient not taking: Reported on 06/04/2022) 60 tablet 1    triamcinolone cream (KENALOG) 0.1 % Apply 1 application topically 2 (two) times daily. Apply to rash on left thigh (Patient not taking: Reported on 06/04/2022) 30 g 0     Musculoskeletal: Strength & Muscle Tone: within normal limits Gait & Station: normal Patient leans: N/A            Psychiatric Specialty Exam:  Presentation  General Appearance:  Fairly Groomed  Eye Contact: Fair  Speech: Pressured  Speech Volume:  Normal  Handedness: Right   Mood and Affect  Mood: Irritable  Affect: Congruent   Thought Process  Thought Processes: Disorganized  Duration of Psychotic Symptoms:N/A Past Diagnosis of Schizophrenia or Psychoactive disorder: Yes  Descriptions of  Associations:Tangential  Orientation:Full (Time, Place and Person)  Thought Content:Tangential; Paranoid Ideation  Hallucinations:No data recorded Ideas of Reference:Paranoia  Suicidal Thoughts:No data recorded Homicidal Thoughts:No data recorded  Sensorium  Memory: Immediate Fair; Recent Fair  Judgment: Impaired  Insight: Poor   Executive Functions  Concentration: Fair  Attention Span: Fair  Recall: [place] of Knowledge: Fair  Language: Fair   Psychomotor Activity  Psychomotor Activity:No data recorded  Assets  Assets: Desire for Improvement; Leisure Time; Physical Health; Resilience; Social Support   Sleep  Sleep:No data recorded   Physical Exam: Physical Exam Constitutional:      Appearance: Normal appearance.  HENT:     Head: Normocephalic and atraumatic.     Mouth/Throat:     Pharynx: Oropharynx is clear.  Eyes:     Pupils: Pupils are equal, round, and reactive to light.  Cardiovascular:     Rate and Rhythm: Normal rate and regular rhythm.  Pulmonary:     Effort: Pulmonary effort is normal.     Breath sounds: Normal breath sounds.  Abdominal:     General: Abdomen is flat.     Palpations: Abdomen is soft.  Musculoskeletal:        General: Normal range of motion.  Skin:    General: Skin is warm and dry.  Neurological:     General: No focal deficit present.     Mental Status: She is alert. Mental status is at baseline.  Psychiatric:        Attention and Perception: Perception normal. She is inattentive.        Mood and Affect: Mood is anxious. Affect is labile and flat.        Speech: Speech is tangential.        Behavior: Behavior is agitated. Behavior is cooperative.        Thought Content: Thought content is paranoid and delusional.        Cognition and Memory: Cognition and memory normal.        Judgment: Judgment is inappropriate.    Review of Systems  Constitutional: Negative.   HENT: Negative.    Eyes: Negative.    Respiratory: Negative.    Cardiovascular: Negative.   Gastrointestinal: Negative.   Genitourinary: Negative.   Musculoskeletal: Negative.   Skin: Negative.   Neurological: Negative.   Endo/Heme/Allergies: Negative.   Psychiatric/Behavioral:  Positive for hallucinations.    Blood pressure (!) 182/124, pulse 85, temperature (!) 97.5 F (36.4 C), temperature source Oral, resp. rate 15, height 5\' 7"  (1.702 m), weight 101.8 kg, last menstrual period 05/10/2013, SpO2 97 %. Body mass index is 35.16 kg/m.  Treatment Plan Summary: Daily contact with patient to assess and evaluate symptoms and progress in treatment, Medication management, and Plan restart Zyprexa  Observation Level/Precautions:  15 minute checks  Laboratory:  CBC Chemistry Profile  Psychotherapy:    Medications:    Consultations:    Discharge Concerns:    Estimated LOS:  Other:     Physician Treatment Plan for Primary Diagnosis: Schizophrenia, disorganized, chronic with acute exacerbation Long Term Goal(s): Improvement in symptoms so as ready for discharge  Short Term Goals: Ability to identify changes in lifestyle to reduce recurrence of condition will improve, Ability to verbalize feelings will improve, Ability to  disclose and discuss suicidal ideas, Ability to demonstrate self-control will improve, Ability to identify and develop effective coping behaviors will improve, Ability to maintain clinical measurements within normal limits will improve, Compliance with prescribed medications will improve, and Ability to identify triggers associated with substance abuse/mental health issues will improve  Physician Treatment Plan for Secondary Diagnosis: Principal Problem:   Schizophrenia, disorganized, chronic with acute exacerbation   I certify that inpatient services furnished can reasonably be expected to improve the patient's condition.    Parks Ranger, DO 4/3/202410:35 AM

## 2022-06-07 NOTE — [facility] Suicide Risk Assessment (Signed)
[facility] Admission Suicide Risk Assessment   Nursing information obtained from:  Patient Demographic factors:  Age 72 or older Current Mental Status:  NA Loss Factors:  NA Historical Factors:  NA Risk Reduction Factors:  NA  Total Time spent with patient: 1 hour Principal Problem: Schizophrenia, disorganized, chronic with acute exacerbation Diagnosis:  Principal Problem:   Schizophrenia, disorganized, chronic with acute exacerbation  Subjective Data:  Patient seen and chart reviewed.  72 year old woman with a past history of schizophrenia transferred to [place] from [place].  Patient was only partially cooperative with the interview much of the history was obtained from the chart.  It looks like she presented to the emergency room in [place] with complaints of burning in her urine and some other medical concerns.  While she was being evaluated it became apparent that first of all her blood pressure was badly controlled and second of all that she was delusional.  Patient was initially admitted to the medical service for evaluation of her hypertension and how it was affecting her cardiomyopathy.  Once she was cleared medically she was seen by psychiatry who felt she needed inpatient treatment.  It was documented that the patient continued to state that she was pregnant and that frequently she would yell at people.  On interview today the patient tells me that I should know everything if I just read the chart.  Goes on to tell me she came to the hospital because she was off her blood pressure medicine.  She denies any belief that she is pregnant.  Denies any hallucinations.  Denies any mood symptoms denies suicidal or homicidal ideation.  She is rather dismissive and on helpful in discussing her social situation but says she still lives with her son.  Denies alcohol or drug abuse   Continued Clinical Symptoms:  Alcohol Use Disorder Identification Test Final Score (AUDIT): 0 The "Alcohol Use Disorders  Identification Test", Guidelines for Use in Primary Care, Second Edition.  World Pharmacologist [facility]). Score between 0-7:  no or low risk or alcohol related problems. Score between 8-15:  moderate risk of alcohol related problems. Score between 16-19:  high risk of alcohol related problems. Score 20 or above:  warrants further diagnostic evaluation for alcohol dependence and treatment.   CLINICAL FACTORS:   Schizophrenia:   Paranoid or undifferentiated type   Musculoskeletal: Strength & Muscle Tone: within normal limits Gait & Station: normal Patient leans: N/A  Psychiatric Specialty Exam:  Presentation  General Appearance:  Fairly Groomed  Eye Contact: Fair  Speech: Pressured  Speech Volume: Normal  Handedness: Right   Mood and Affect  Mood: Irritable  Affect: Congruent   Thought Process  Thought Processes: Disorganized  Descriptions of Associations:Tangential  Orientation:Full (Time, Place and Person)  Thought Content:Tangential; Paranoid Ideation  History of Schizophrenia/Schizoaffective disorder:Yes  Duration of Psychotic Symptoms:Greater than six months  Hallucinations:No data recorded Ideas of Reference:Paranoia  Suicidal Thoughts:No data recorded Homicidal Thoughts:No data recorded  Sensorium  Memory: Immediate Fair; Recent Fair  Judgment: Impaired  Insight: Poor   Community education officer  Concentration: Fair  Attention Span: Fair  Recall: [place] of Knowledge: Fair  Language: Fair   Psychomotor Activity  Psychomotor Activity:No data recorded  Assets  Assets: Desire for Improvement; Leisure Time; Physical Health; Resilience; Social Support   Sleep  Sleep:No data recorded    Blood pressure (!) 182/124, pulse 85, temperature (!) 97.5 F (36.4 C), temperature source Oral, resp. rate 15, height 5\' 7"  (1.702 m), weight 101.8  kg, last menstrual period 05/10/2013, SpO2 97 %. Body mass index is 35.16  kg/m.   COGNITIVE FEATURES THAT CONTRIBUTE TO RISK:  Closed-mindedness    SUICIDE RISK:   Minimal: No identifiable suicidal ideation.  Patients presenting with no risk factors but with morbid ruminations; may be classified as minimal risk based on the severity of the depressive symptoms  PLAN OF CARE: See orders  I certify that inpatient services furnished can reasonably be expected to improve the patient's condition.   Parks Ranger, DO 06/07/2022, 10:58 AM

## 2022-06-07 NOTE — Progress Notes (Signed)
Pt awake in room and laughing loudly and talking to herself. Room door pulled closed so other sleeping patients won't be disturbed. Will continue to monitor.

## 2022-06-07 NOTE — Progress Notes (Signed)
D- Patient alert and oriented x 3. Patient disoriented to situation. Patient presents with a labile mood and affect. Patient denies SI, HI, AVH, and pain.   A- Scheduled medications administered to patient, per MD orders. Support and encouragement provided.  Routine safety checks conducted every 15 minutes.  Patient informed to notify staff with problems or concerns.  R- No adverse drug reactions noted. Patient contracts for safety at this time. Patient compliant with medications and treatment plan. Patient receptive, calm, and cooperative. Patient did not interact much with others on the unit. Patient in her room isolated most of the shift except for meal times. Patient remains safe at this time.   06/07/22 0742  Charting Type  Charting Type Shift assessment  Safety Check Verification  Has the RN verified the 15 minute safety check completion? Yes  Neurological  Neuro (WDL) X  Orientation Level Disoriented to situation;Oriented to person;Oriented to place;Oriented to time  Cognition Poor attention/concentration;Poor judgement  Neuro Symptoms Agitation  HEENT  HEENT (WDL) WDL  Respiratory  Respiratory (WDL) WDL  Cough None  Respiratory Pattern Regular;Unlabored  Chest Assessment Chest expansion symmetrical  Cardiac  Cardiac (WDL) WDL  Vascular  Vascular (WDL) WDL  Integumentary  Integumentary (WDL) WDL  Braden Scale (Ages 8 and up)  Sensory Perceptions 4  Moisture 4  Activity 3  Mobility 4  Nutrition 3  Friction and Shear 3  Braden Scale Score 21  Musculoskeletal  Musculoskeletal (WDL) WDL  Assistive Device None  Gastrointestinal  Gastrointestinal (WDL) WDL  GU Assessment  Genitourinary (WDL) WDL  Neurological  Level of Consciousness Alert

## 2022-06-07 NOTE — BH IP Treatment Plan (Signed)
Interdisciplinary Treatment and Diagnostic Plan Update  06/07/2022 Time of Session: 9:35AM Erin Riley MRN: [id number]  Principal Diagnosis: Schizophrenia, disorganized, chronic with acute exacerbation  Secondary Diagnoses: Principal Problem:   Schizophrenia, disorganized, chronic with acute exacerbation   Current Medications:  Current Facility-Administered Medications  Medication Dose Route Frequency Provider Last Rate Last Admin   acetaminophen (TYLENOL) tablet 650 mg  650 mg Oral Q6H PRN Parks Ranger, DO       alum & mag hydroxide-simeth (MAALOX/MYLANTA) 200-200-20 MG/5ML suspension 30 mL  30 mL Oral Q4H PRN Parks Ranger, DO       atorvastatin (LIPITOR) tablet 40 mg  40 mg Oral QHS Parks Ranger, DO   40 mg at 06/06/22 2100   carvedilol (COREG) tablet 6.25 mg  6.25 mg Oral BID WC Parks Ranger, DO       cloNIDine (CATAPRES) tablet 0.2 mg  0.2 mg Oral Q6H PRN Parks Ranger, DO       furosemide (LASIX) tablet 40 mg  40 mg Oral Daily Parks Ranger, DO   40 mg at 06/07/22 0800   haloperidol (HALDOL) tablet 5 mg  5 mg Oral Q6H PRN Parks Ranger, DO   5 mg at 06/07/22 0800   Or   haloperidol lactate (HALDOL) injection 5 mg  5 mg Intramuscular Q6H PRN Parks Ranger, DO       hydrALAZINE (APRESOLINE) tablet 50 mg  50 mg Oral BID Parks Ranger, DO   50 mg at 06/07/22 0800   isosorbide mononitrate (IMDUR) 24 hr tablet 60 mg  60 mg Oral Daily Parks Ranger, DO   60 mg at 06/07/22 0800   LORazepam (ATIVAN) tablet 1 mg  1 mg Oral Q6H PRN Parks Ranger, DO   1 mg at 06/07/22 0800   Or   LORazepam (ATIVAN) injection 1 mg  1 mg Intramuscular Q6H PRN Parks Ranger, DO       magnesium hydroxide (MILK OF MAGNESIA) suspension 30 mL  30 mL Oral Daily PRN Parks Ranger, DO       OLANZapine zydis (ZYPREXA) disintegrating tablet 5 mg  5 mg Oral BID Parks Ranger, DO   5  mg at 06/07/22 0800   traZODone (DESYREL) tablet 100 mg  100 mg Oral QHS PRN Parks Ranger, DO       PTA Medications: Medications Prior to Admission  Medication Sig Dispense Refill Last Dose   aspirin EC 81 MG EC tablet Take 1 tablet (81 mg total) by mouth daily. Swallow whole. (Patient not taking: Reported on 06/04/2022) 30 tablet 0    atorvastatin (LIPITOR) 40 MG tablet Take 1 tablet (40 mg total) by mouth daily. 30 tablet 1    carvedilol (COREG) 3.125 MG tablet Take 1 tablet (3.125 mg total) by mouth 2 (two) times daily with a meal. 60 tablet 1    ferrous sulfate 325 (65 FE) MG EC tablet Take 325 mg by mouth daily with breakfast. (Patient not taking: Reported on 06/04/2022)      furosemide (LASIX) 40 MG tablet Take 1 tablet (40 mg total) by mouth daily. (Patient not taking: Reported on 06/04/2022) 30 tablet 1    hydrALAZINE (APRESOLINE) 50 MG tablet Take 1 tablet (50 mg total) by mouth 2 (two) times daily. 60 tablet 1    isosorbide mononitrate (IMDUR) 60 MG 24 hr tablet Take 1 tablet (60 mg total) by mouth daily. 30 tablet 1  Multiple Vitamins-Minerals (MULTIVITAMIN WITH MINERALS) tablet Take 1 tablet by mouth daily. (Patient not taking: Reported on 06/04/2022)      OLANZapine zydis (ZYPREXA) 5 MG disintegrating tablet Dissolve 1 tablet (5 mg total) by mouth 2 (two) times daily. (Patient not taking: Reported on 06/04/2022) 60 tablet 1    triamcinolone cream (KENALOG) 0.1 % Apply 1 application topically 2 (two) times daily. Apply to rash on left thigh (Patient not taking: Reported on 06/04/2022) 30 g 0     Patient Stressors: Health problems   Medication change or noncompliance    Patient Strengths: Active sense of humor  Capable of independent living  Motivation for treatment/growth   Treatment Modalities: Medication Management, Group therapy, Case management,  1 to 1 session with clinician, Psychoeducation, Recreational therapy.   Physician Treatment Plan for Primary Diagnosis:  Schizophrenia, disorganized, chronic with acute exacerbation Long Term Goal(s): Improvement in symptoms so as ready for discharge   Short Term Goals: Ability to identify changes in lifestyle to reduce recurrence of condition will improve Ability to verbalize feelings will improve Ability to disclose and discuss suicidal ideas Ability to demonstrate self-control will improve Ability to identify and develop effective coping behaviors will improve Ability to maintain clinical measurements within normal limits will improve Compliance with prescribed medications will improve Ability to identify triggers associated with substance abuse/mental health issues will improve  Medication Management: Evaluate patient's response, side effects, and tolerance of medication regimen.  Therapeutic Interventions: 1 to 1 sessions, Unit Group sessions and Medication administration.  Evaluation of Outcomes: Not Met  Physician Treatment Plan for Secondary Diagnosis: Principal Problem:   Schizophrenia, disorganized, chronic with acute exacerbation  Long Term Goal(s): Improvement in symptoms so as ready for discharge   Short Term Goals: Ability to identify changes in lifestyle to reduce recurrence of condition will improve Ability to verbalize feelings will improve Ability to disclose and discuss suicidal ideas Ability to demonstrate self-control will improve Ability to identify and develop effective coping behaviors will improve Ability to maintain clinical measurements within normal limits will improve Compliance with prescribed medications will improve Ability to identify triggers associated with substance abuse/mental health issues will improve     Medication Management: Evaluate patient's response, side effects, and tolerance of medication regimen.  Therapeutic Interventions: 1 to 1 sessions, Unit Group sessions and Medication administration.  Evaluation of Outcomes: Not Met   RN Treatment Plan for  Primary Diagnosis: Schizophrenia, disorganized, chronic with acute exacerbation Long Term Goal(s): Knowledge of disease and therapeutic regimen to maintain health will improve  Short Term Goals: Ability to demonstrate self-control, Ability to participate in decision making will improve, Ability to verbalize feelings will improve, Ability to disclose and discuss suicidal ideas, Ability to identify and develop effective coping behaviors will improve, and Compliance with prescribed medications will improve  Medication Management: RN will administer medications as ordered by provider, will assess and evaluate patient's response and provide education to patient for prescribed medication. RN will report any adverse and/or side effects to prescribing provider.  Therapeutic Interventions: 1 on 1 counseling sessions, Psychoeducation, Medication administration, Evaluate responses to treatment, Monitor vital signs and CBGs as ordered, Perform/monitor CIWA, COWS, AIMS and Fall Risk screenings as ordered, Perform wound care treatments as ordered.  Evaluation of Outcomes: Not Met   LCSW Treatment Plan for Primary Diagnosis: Schizophrenia, disorganized, chronic with acute exacerbation Long Term Goal(s): Safe transition to appropriate next level of care at discharge, Engage patient in therapeutic group addressing interpersonal concerns.  Short Term Goals:  Engage patient in aftercare planning with referrals and resources, Increase social support, Increase ability to appropriately verbalize feelings, Increase emotional regulation, Facilitate acceptance of mental health diagnosis and concerns, and Increase skills for wellness and recovery  Therapeutic Interventions: Assess for all discharge needs, 1 to 1 time with Social worker, Explore available resources and support systems, Assess for adequacy in community support network, Educate family and significant other(s) on suicide prevention, Complete Psychosocial  Assessment, Interpersonal group therapy.  Evaluation of Outcomes: Not Met   Progress in Treatment: Attending groups: No. Participating in groups: No. Taking medication as prescribed: Yes. Toleration medication: Yes. Family/Significant other contact made: No, will contact:  once permission is given.,  Patient understands diagnosis: No. Discussing patient identified problems/goals with staff: Yes. Medical problems stabilized or resolved: Yes. Denies suicidal/homicidal ideation: Yes. Issues/concerns per patient self-inventory: No. Other: none  New problem(s) identified: No, Describe:  none  New Short Term/Long Term Goal(s):  elimination of symptoms of psychosis, medication management for mood stabilization; elimination of SI thoughts; development of comprehensive mental wellness plan.   Patient Goals:  "I was upset there was a lot of trauma that's all"  Discharge Plan or Barriers: CSW to assist with the development of appropriate discharge plans.  Reason for Continuation of Hospitalization: Anxiety Depression Medication stabilization Suicidal ideation  Estimated Length of Stay:  1-7 days  Last 3 [place] Suicide Severity Risk Score: Flowsheet Row Admission (Current) from 06/06/2022 in [place] ED from 06/04/2022 in [facility] Emergency Department at [facility] ED to Hosp-Admission (Discharged) from 03/25/2022 in [place] No Risk No Risk No Risk       Last PHQ 2/9 Scores:    01/26/2021   10:37 AM 01/09/2018    4:02 PM 11/13/2017    9:11 AM  Depression screen PHQ 2/9  Decreased Interest 0 0 0  Down, Depressed, Hopeless 0 0 0  PHQ - 2 Score 0 0 0  Altered sleeping 0    Tired, decreased energy 0    Change in appetite 0    Feeling bad or failure about yourself  0    Trouble concentrating 0    Moving slowly or fidgety/restless 0    Suicidal thoughts 0    PHQ-9 Score 0     Difficult doing work/chores Not difficult at all      Scribe for Treatment Team: Rozann Lesches, LCSW 06/07/2022 2:01 PM

## 2022-06-07 NOTE — Plan of Care (Signed)
  Problem: Education: Goal: Knowledge of General Education information will improve Description: Including pain rating scale, medication(s)/side effects and non-pharmacologic comfort measures Outcome: Not Progressing   Problem: Health Behavior/Discharge Planning: Goal: Ability to manage health-related needs will improve Outcome: Not Progressing   Problem: Activity: Goal: Risk for activity intolerance will decrease Outcome: Progressing   Problem: Nutrition: Goal: Adequate nutrition will be maintained Outcome: Progressing   Problem: Education: Goal: Knowledge of [place] [facility] Education information/materials will improve Outcome: Not Progressing Goal: Emotional status will improve Outcome: Not Progressing Goal: Mental status will improve Outcome: Not Progressing

## 2022-06-07 NOTE — Group Note (Signed)
Date:  06/07/2022 Time:  10:20 AM  Group Topic/Focus:  Emotional Education:   The focus of this group is to discuss what feelings/emotions are, and how they are experienced. Making Healthy Choices:   The focus of this group is to help patients identify negative/unhealthy choices they were using prior to admission and identify positive/healthier coping strategies to replace them upon discharge.    Participation Level:  Did Not Attend  Participation Quality:  X  Affect:  Labile  Cognitive:  Alert  Insight: Limited  Engagement in Group:  X  Modes of Intervention:  X  Additional Comments:  X  Cristobal Advani l Krina Mraz 06/07/2022, 10:20 AM

## 2022-06-07 NOTE — Group Note (Signed)
Date:  06/07/2022 Time:  11:23 PM  Group Topic/Focus:  Building Self Esteem:   The Focus of this group is helping patients become aware of the effects of self-esteem on their lives, the things they and others do that enhance or undermine their self-esteem, seeing the relationship between their level of self-esteem and the choices they make and learning ways to enhance self-esteem. Making Healthy Choices:   The focus of this group is to help patients identify negative/unhealthy choices they were using prior to admission and identify positive/healthier coping strategies to replace them upon discharge.    Participation Level:  Active  Participation Quality:  Appropriate  Affect:  Appropriate  Cognitive:  Alert  Insight: Good  Engagement in Group:  Engaged  Modes of Intervention:  Support  Additional Comments:    Bradd Canary 06/07/2022, 11:23 PM

## 2022-06-07 NOTE — Progress Notes (Signed)
Patient in the dayroom refusing morning vitals to be taken by NT. Writer encouraged patient to allow writer to take vitals. Patient compliant with writer with allowing vitals to be taken. Patient agitated and screaming in the dayroom while walking to the table, "You hurt my leg, you should not have done that. I will f* you up, you fat sloppy B*," while looking at NT. Patient continued to yell and curse stating that she has a device on her that tells her if someone is mistreating her. PRN agitation medication administered.   Patient noted to actively display delusions while in the dayroom and in her room. Writer will continue to monitor and assess patients mental status and agitation as needed.

## 2022-06-07 NOTE — Group Note (Signed)
Recreation Therapy Group Note   Group Topic:Other  Group Date: 06/07/2022 Start Time: 1400 End Time: 1440 Facilitators: Vilma Prader, LRT, CTRS Location: Courtyard  Group Description: Reminisce Cards. Patients drew a laminated card out of a bag that had a word or phrase on it. Pt encouraged to speak about a time in their life or memory that specifically relates to the word they chose out of the bag. An example would be: "parenthood, meals, siblings, travel, or home".  LRT prompted following questions and encouraged contribution from peers to increase communication.   Affect/Mood: N/A   Participation Level: Did not attend    Clinical Observations/Individualized Feedback: Carlicia did not attend group despite encouragement.   Plan: Continue to engage patient in RT group sessions 2-3x/week.   Vilma Prader, LRT, CTRS 06/07/2022 3:02 PM

## 2022-06-07 NOTE — Group Note (Signed)
[place] LCSW Group Therapy Note   Group Date: 06/07/2022 Start Time: 1300 End Time: 1330   Type of Therapy/Topic:  Group Therapy:  Emotion Regulation  Participation Level:  Did Not Attend   Mood:  Description of Group:    The purpose of this group is to assist patients in learning to regulate negative emotions and experience positive emotions. Patients will be guided to discuss ways in which they have been vulnerable to their negative emotions. These vulnerabilities will be juxtaposed with experiences of positive emotions or situations, and patients challenged to use positive emotions to combat negative ones. Special emphasis will be placed on coping with negative emotions in conflict situations, and patients will process healthy conflict resolution skills.  Therapeutic Goals: Patient will identify two positive emotions or experiences to reflect on in order to balance out negative emotions:  Patient will label two or more emotions that they find the most difficult to experience:  Patient will be able to demonstrate positive conflict resolution skills through discussion or role plays:   Summary of Patient Progress: Patient declined to attend.   Therapeutic Modalities:   Cognitive Behavioral Therapy Feelings Identification Dialectical Behavioral Therapy   Rozann Lesches, LCSW

## 2022-06-07 NOTE — [facility] Counselor (Signed)
CSW attempted to complete PSA with patient.  Patient was unable to complete assessment.  She was going in and out of asleep.  CSW will attempt again at a later date.  Assunta Curtis, MSW, LCSW 06/07/2022 2:06 PM

## 2022-06-08 DIAGNOSIS — F201 Disorganized schizophrenia: Secondary | ICD-10-CM | POA: Diagnosis not present

## 2022-06-08 NOTE — Progress Notes (Signed)
D- Patient alert and oriented x 3. Patient denies SI, HI, AVH, and pain. Patient notably experiencing AVH and delusions. Patient pleasant at start of shift and progressed to having a labile and irritable mood and affect. Patient accusing staff and noted to be talking to herself. Patient states, "Call the [facility] and let them know I am ready for them to pick me up." Patient picking off of others dinner tray. Writer educated patient on unit expectations of not sharing items and/or food.  A- Scheduled medications administered to patient, per MD orders. Support and encouragement provided.  Routine safety checks conducted every 15 minutes.  Patient informed to notify staff with problems or concerns.  R- No adverse drug reactions noted. Patient contracts for safety at this time. Patient compliant with medications and treatment plan. Patient receptive, calm, and cooperative. Patient did not attend any of the group sessions on the unit. Patient interacted well with others on the unit.  Patient remains safe at this time.   06/08/22 0749  Vital Signs  Temp 98.4 F (36.9 C)  Temp Source Oral  Pulse Rate 65  Pulse Rate Source Monitor  Resp 17  BP (!) 162/92  BP Location Left Arm  BP Method Automatic  Patient Position (if appropriate) Sitting  Level of Consciousness  Level of Consciousness Alert  MEWS COLOR  MEWS Score Color Green  Oxygen Therapy  SpO2 100 %  O2 Device Room Air  Pain Assessment  Pain Scale 0-10  Pain Score 0  PCA/Epidural/Spinal Assessment  Respiratory Pattern Regular;Unlabored  MEWS Score  MEWS Temp 0  MEWS Systolic 0  MEWS Pulse 0  MEWS RR 0  MEWS LOC 0  MEWS Score 0

## 2022-06-08 NOTE — Group Note (Signed)
Date:  06/08/2022 Time:  8:49 PM  Group Topic/Focus:  Personal Choices and Values:   The focus of this group is to help patients assess and explore the importance of values in their lives, how their values affect their decisions, how they express their values and what opposes their expression.    Participation Level:  Active  Participation Quality:  Appropriate  Affect:  Appropriate  Cognitive:  Alert  Insight: Improving  Engagement in Group:  Improving  Modes of Intervention:  Discussion   Erin Riley 06/08/2022, 8:49 PM

## 2022-06-08 NOTE — BHH Counselor (Signed)
Adult Comprehensive Assessment  Patient ID: Erin Riley, female   DOB: 1950/08/12, 72 y.o.   MRN: [id number]  Information Source: Information source: Patient (Patient declined to answer majority of questions.  Additional information gathered from chart review.)  Current Stressors:  Patient states their primary concerns and needs for treatment are:: "I had a lot of truama" Patient states their goals for this hospitilization and ongoing recovery are:: "I don't know." Educational / Learning stressors: Pt declined to answer. Employment / Job issues: Pt declined to answer. Family Relationships: Pt declined to answer. Financial / Lack of resources (include bankruptcy): Pt declined to answer. Housing / Lack of housing: Pt declined to answer. Physical health (include injuries & life threatening diseases): Pt declined to answer. Social relationships: Pt declined to answer. Substance abuse: Pt declined to answer. Bereavement / Loss: Pt declined to answer.  Living/Environment/Situation:  Living Arrangements: Non-relatives/Friends Living conditions (as described by patient or guardian): "I have my own room much like I have here" Who else lives in the home?: "other people" How long has patient lived in current situation?: "one year" What is atmosphere in current home: Other (Comment) ("it's like here")  Family History:  Marital status: Single Does patient have children?: Yes (Pt declined to answer.  However, chart review indicates that patient has 2 sons, one of which she lives with.) How many children?: 2 (CSW notes that patient declined to answer.  Previous assessments indicate both 1 and 2 sons.  CSW unable to assess.) How is patient's relationship with their children?: "I am not going to get into that. We'll leave that blank."  Childhood History:  By whom was/is the patient raised?: Both parents (Pt declined to answer.  Information gathered from chart review.) Description of patient's  relationship with caregiver when they were a child: Per previous assessments "I had a good childhood". Patient's description of current relationship with people who raised him/her: Per previous assessments "Pt reports mother is deceased, however father is still living adn 'he loves me still'.  Previous assessment identifies that mother is still living and father is deceased." How were you disciplined when you got in trouble as a child/adolescent?: Pt declined to answer stating "I ain't getting into that, that's none of your business." Does patient have siblings?:  (Pt declined to answer.  Previous assesment reports that patient has 9 siblings.) Did patient suffer any verbal/emotional/physical/sexual abuse as a child?:  (Pt declined to answer.) Did patient suffer from severe childhood neglect?:  (Pt declined to answer.) Has patient ever been sexually abused/assaulted/raped as an adolescent or adult?:  (Pt declined to answer.) Was the patient ever a victim of a crime or a disaster?:  (Pt declined to answer.) Witnessed domestic violence?:  (Pt declined to answer.) Has patient been affected by domestic violence as an adult?:  (Pt declined to answer.)  Education:  Highest grade of school patient has completed: Pt declined to answer. Learning disability?:  (Pt declined to answer.)  Employment/Work Situation:   Employment Situation:  (Pt declined to answer. Stating "I'm off work now.") What is the Longest Time Patient has Held a Job?: Pt declined to answer. Where was the Patient Employed at that Time?: Pt declined to answer. Has Patient ever Been in the [employer]?:  (Pt declined to answer.)  Financial Resources:   Financial resources: Medicare Does patient have a representative payee or guardian?:  (Pt declined to answer.)  Alcohol/Substance Abuse:   What has been your use of drugs/alcohol within the last 12 months?:  Pt declined to answer. If attempted suicide, did drugs/alcohol play a role in  this?:  (Pt declined to answer.) Has alcohol/substance abuse ever caused legal problems?:  (Pt declined to answer.)  Social Support System:   Describe Community Support System: "myself" Type of faith/religion: "yes but that's not concerning you" How does patient's faith help to cope with current illness?: Pt declined to answer.  Leisure/Recreation:   Do You Have Hobbies?: No  Strengths/Needs:   What is the patient's perception of their strengths?: "I'm fine" Patient states they can use these personal strengths during their treatment to contribute to their recovery: Pt declined to answer. Patient states these barriers may affect/interfere with their treatment: Pt declined to answer. Patient states these barriers may affect their return to the community: Pt declined to answer.  Discharge Plan:   Currently receiving community mental health services: No Patient states concerns and preferences for aftercare planning are: Pt reports that she does not want a referral for mental health at discharge. Patient states they will know when they are safe and ready for discharge when: "when your paperwork tells me" Does patient have access to transportation?:  (Pt declined to answer.) Does patient have financial barriers related to discharge medications?: No Will patient be returning to same living situation after discharge?:  (Pt declined to answer.)  Summary/Recommendations:   Summary and Recommendations (to be completed by the evaluator): Patient is a 72 year old female from [place], [place] ([place]).  She presents to the hospital with concerns of neck pain.  When under further assessment patient reported that a plastic snake fell from her doorsill onto her neck and she is concerned that the snake has injected her with drugs.  She reports that she was unsure of what drugs.  When this writer spoke with patient no reason for admission patient stated that "a lot of trauma". However, patient declined  to provide any additional information on what that trauma consisted of.  This Probation officer was unable to assess for any possible triggers for current mental health state, patient declined to engage this Probation officer in conversation on mental health needs, demographics or any past issues stating that she is focused on her future.  When CSW asked about future plans the patient declined to inform her of her goals.  Patient reports that she does not have a current mental health provider and is not currently open to a referral at discharge. Recommendations include: crisis stabilization, therapeutic milieu, encourage group attendance and participation, medication management for mood stabilization and development of comprehensive mental wellness.  Rozann Lesches. 06/08/2022

## 2022-06-08 NOTE — Group Note (Signed)
Date:  06/08/2022 Time:  10:55 AM  Group Topic/Focus:  Overcoming Stress:   The focus of this group is to define stress and help patients assess their triggers.  Meditation Exercise   Participation Level:  Did Not Attend  [place] 06/08/2022, 10:55 AM

## 2022-06-08 NOTE — Progress Notes (Addendum)
Patient is A+O x 3. She denies SI/HI/AVH. She denies anxiety and depression. Appetite good. Medication compliant. Pain 0/10.  Met with patient in the dayroom at the beginning of the shift. She stated that she was cold and that if she wore shoes with socks it would help to feel warmer. Shoes retrieved from locker. Patient was very grateful. Attended group in the dayroom and displayed a positive attitude. Patient rested and was heard at times laughing and talking amongst herself.  Q15 minute unit checks in place.

## 2022-06-08 NOTE — [facility] Suicide Risk Assessment (Signed)
[place] INPATIENT:  Family/Significant Other Suicide Prevention Education  Suicide Prevention Education:  Patient Refusal for Family/Significant Other Suicide Prevention Education: The patient Erin Riley has refused to provide written consent for family/significant other to be provided Family/Significant Other Suicide Prevention Education during admission and/or prior to discharge.  Physician notified.  SPE completed with pt, as pt refused to consent to family contact. SPI pamphlet provided to pt and pt was encouraged to share information with support network, ask questions, and talk about any concerns relating to SPE. Pt denies access to guns/firearms and verbalized understanding of information provided. Mobile Crisis information also provided to pt.   Rozann Lesches 06/08/2022, 10:26 AM

## 2022-06-08 NOTE — Group Note (Signed)
LCSW Group Therapy Note  Group Date: 06/08/2022 Start Time: 1330 End Time: 1400   Type of Therapy and Topic:  Group Therapy - Healthy vs Unhealthy Coping Skills  Participation Level:  Did Not Attend   Description of Group The focus of this group was to determine what unhealthy coping techniques typically are used by group members and what healthy coping techniques would be helpful in coping with various problems. Patients were guided in becoming aware of the differences between healthy and unhealthy coping techniques. Patients were asked to identify 2-3 healthy coping skills they would like to learn to use more effectively.  Therapeutic Goals Patients learned that coping is what human beings do all day long to deal with various situations in their lives Patients defined and discussed healthy vs unhealthy coping techniques Patients identified their preferred coping techniques and identified whether these were healthy or unhealthy Patients determined 2-3 healthy coping skills they would like to become more familiar with and use more often. Patients provided support and ideas to each other   Summary of Patient Progress:   X  Therapeutic Modalities Cognitive Behavioral Therapy Motivational Interviewing  Erin Riley, [place] 06/08/2022  2:18 PM

## 2022-06-08 NOTE — Progress Notes (Incomplete)
Patient is A+O x 3. She denies SI/HI/AVH. She denies anxiety and depression. Appetite good. Medication compliant.  Met with patient in the dayroom at the beginning of the shift. She stated that she was cold and that if she wore shoes with socks it would help to feel warmer. Shoes retrieved from locker. Patient was very grateful.

## 2022-06-08 NOTE — Group Note (Signed)
Recreation Therapy Group Note   Group Topic:Self-Esteem  Group Date: 06/08/2022 Start Time: 1400 End Time: 1450 Facilitators: Vilma Prader, LRT, CTRS Location:  [facility]  Group Description: Positive Focus. Patients are given a handout that has 9 different boxes on it. Each box has a different prompt on it that requires you to identify and add something positive about themselves. LRT encourages pts to share at least two of their boxes to the group. LRT and pts discuss the importance of "thinking positive", self-esteem and how they can apply it to their everyday life post-discharge.  Affect/Mood: N/A   Participation Level: Did not attend    Clinical Observations/Individualized Feedback: Rozann did not attend group due to watching a movie in the dayroom. Jasmine, RN, suggested not to cut off the TV because "patient is really into the movie and laughing and smiling at it". LRT encouraged pt to join, however she politely declined.   Plan: Continue to engage patient in RT group sessions 2-3x/week.   Vilma Prader, LRT, CTRS 06/08/2022 3:37 PM

## 2022-06-08 NOTE — Plan of Care (Signed)
  Problem: Elimination: Goal: Will not experience complications related to bowel motility Outcome: Progressing Goal: Will not experience complications related to urinary retention Outcome: Progressing   Problem: Safety: Goal: Ability to remain free from injury will improve Outcome: Progressing   

## 2022-06-08 NOTE — Progress Notes (Signed)
Patient alert & oriented x 3.  This Probation officer discussed the situation with her.  She initially was abrasive but listened. She allowed this writer to check her BP manually @ 19:32 result 142/98.  She was in the dayroom for Group Therapy & participated appropriately.  She was sociable & supportive of others during group.  She denied SI, HI, delusions, and hallucinations.  No s/sx. of acute distress observed or reported.  She rested in her room, and was heard talking to herself at one point.    She took all medications without difficulty.  No adverse reactions.  Plan of care is ongoing.

## 2022-06-08 NOTE — Plan of Care (Signed)
  Problem: Health Behavior/Discharge Planning: Goal: Ability to manage health-related needs will improve Outcome: Progressing   Problem: Nutrition: Goal: Adequate nutrition will be maintained Outcome: Progressing   Problem: Coping: Goal: Level of anxiety will decrease Outcome: Progressing   Problem: Elimination: Goal: Will not experience complications related to bowel motility Outcome: Progressing   Problem: Education: Goal: Knowledge of [facility] Education information/materials will improve Outcome: Progressing Goal: Emotional status will improve Outcome: Progressing Goal: Mental status will improve Outcome: Progressing

## 2022-06-08 NOTE — Progress Notes (Signed)
[facility] MD Progress Note  06/08/2022 1:21 PM Erin Riley  MRN:  [id number] Subjective: Erin Riley is seen on rounds.  She has been pleasant and cooperative.  Nurses report no issues.  She has been compliant with medications.  She denies any side effects.  Good controls.  She denies all signs and symptoms.  Principal Problem: Schizophrenia, disorganized, chronic with acute exacerbation Diagnosis: Principal Problem:   Schizophrenia, disorganized, chronic with acute exacerbation  Total Time spent with patient: 15 minutes  Past Psychiatric History: Long history of schizophrenia and noncompliance.  Past Medical History:  Past Medical History:  Diagnosis Date   Chronic back pain    Chronic kidney disease (CKD), stage II (mild)    Class I-II   Coronary artery disease 04/2009   50% stenosis in the perforator of LAD; catheterization was for an abnormal Myoview in January 2000 showing anterior and inferolateral ischemia.   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 > 3 subsequent Echos show improved/normal EF with moderate to severe concentric LVH and diastolic dysfunction with LVOT/intracavitary gradient --> 06/2016: Severe LVH.  Vigorous EF, 65-70%.?? Gr 1 DD. Mild AS.   Hyperlipidemia    Hypertension    Hypertensive hypertrophic cardiomyopathy: NYHA class II:  Echo: Severe concentric LVH with LV OT gradient; essentially preserved EF with diastolic dysfunction [id number]   Echo 06/2016: Severe Concentric LVH. Vigorous EF 65-70%. ~ Gr I DD.    Mild aortic stenosis by prior echocardiography    Echo 06/2016: Mild AS (Mean Gradient 15 mmHg); has had prior Mod-Severe MR (not seen on current echo)   PAD (peripheral artery disease) March 2013   Lower extremity Dopplers: R. SFA 50-60%, R. PTA proximally occluded with distal reconstitution;; L. common iliac ~50%, L. SFA 50-70% stenosis, L. PTA < 50%    Schizophrenia     Past Surgical History:  Procedure Laterality Date   BUNIONECTOMY     carotid doppler  05/29/2011   left bulb/prox ICA moderate amtfibrous plaque with no evidence significant reduction.,right bulb /proximal ICA normal patency   lower extremity doppler  05/29/2011   right SFA 50% to 59% diameter reduction,right posterior tibal atreery occlusive disease,reconstituting distally, left common illiac<50%,left SFA 50 to70%,left post. tibial <50%   NM MYOCAR PERF WALL MOTION  03/2009   Persantine; EF 51%-both anterior and inferolateral ischemia   TRANSTHORACIC ECHOCARDIOGRAM  06/2016   Severe LVH.  Vigorous EF of 65-70%.  No RWMA. ~Only grade 1 diastolic dysfunction.  Mild aortic stenosis (mean gradient 15 mmHg)   TRANSTHORACIC ECHOCARDIOGRAM  07/2012   EF 50-55%; severe concentric LVH; only grade 1 diastolic dysfunction. Mild aortic sclerosis - with LVOT /intracavitary gradient of roughly 20 mmHg mean. Mild to moderately dilated LA;; previously reported MR not seen   Family History:  Family History  Problem Relation Age of Onset   Hypertension Mother    Breast cancer Neg Hx    Family Psychiatric  History: Unremarkable Social History:  Social History   Substance and Sexual Activity  Alcohol Use No     Social History   Substance and Sexual Activity  Drug Use No    Social History   Socioeconomic History   Marital status: Single    Spouse name: Not on file   Number of children: 7   Years of education: Not on file   Highest education level: Not on file  Occupational History  Not on file  Tobacco Use   Smoking status: Some Days    Types: Cigarettes    Last attempt to quit: 05/14/2002    Years since quitting: 20.0   Smokeless tobacco: Never  Vaping Use   Vaping Use: Never used  Substance and Sexual Activity   Alcohol use: No   Drug use: No   Sexual activity: Yes    Birth control/protection: Post-menopausal, None  Other Topics Concern   Not on file  Social  History Narrative   Now single mother of 2 with one grandchild. She quit smoking roughly 5 years ago and is not so since. She has also stopped drinking alcohol. She does try get routine exercise walking at least a mile 3-4 days a week.    She lives with her 71 year old mother. 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   Social Determinants of Health   Financial Resource Strain: Not on file  Food Insecurity: No Food Insecurity (06/06/2022)   Hunger Vital Sign    Worried About Running Out of Food in the Last Year: Never true    Ran Out of Food in the Last Year: Never true  Transportation Needs: No Transportation Needs (06/06/2022)   PRAPARE - Hydrologist (Medical): No    Lack of Transportation (Non-Medical): No  Physical Activity: Not on file  Stress: Not on file  Social Connections: Not on file   Additional Social History:                         Sleep: Good  Appetite:  Good  Current Medications: Current Facility-Administered Medications  Medication Dose Route Frequency Provider Last Rate Last Admin   acetaminophen (TYLENOL) tablet 650 mg  650 mg Oral Q6H PRN Parks Ranger, DO       alum & mag hydroxide-simeth (MAALOX/MYLANTA) 200-200-20 MG/5ML suspension 30 mL  30 mL Oral Q4H PRN Parks Ranger, DO       atorvastatin (LIPITOR) tablet 40 mg  40 mg Oral QHS Parks Ranger, DO   40 mg at 06/07/22 2134   carvedilol (COREG) tablet 6.25 mg  6.25 mg Oral BID WC Parks Ranger, DO   6.25 mg at 06/08/22 0816   cloNIDine (CATAPRES) tablet 0.2 mg  0.2 mg Oral Q6H PRN Parks Ranger, DO       furosemide (LASIX) tablet 40 mg  40 mg Oral Daily Parks Ranger, DO   40 mg at 06/08/22 0816   haloperidol (HALDOL) tablet 5 mg  5 mg Oral Q6H PRN Parks Ranger, DO   5 mg at 06/07/22 0800   Or   haloperidol lactate (HALDOL) injection 5 mg  5 mg Intramuscular Q6H PRN Parks Ranger, DO        hydrALAZINE (APRESOLINE) tablet 50 mg  50 mg Oral BID Parks Ranger, DO   50 mg at 06/08/22 0816   isosorbide mononitrate (IMDUR) 24 hr tablet 60 mg  60 mg Oral Daily Parks Ranger, DO   60 mg at 06/08/22 0816   LORazepam (ATIVAN) tablet 1 mg  1 mg Oral Q6H PRN Parks Ranger, DO   1 mg at 06/07/22 0800   Or   LORazepam (ATIVAN) injection 1 mg  1 mg Intramuscular Q6H PRN Parks Ranger, DO       magnesium hydroxide (MILK OF MAGNESIA) suspension 30 mL  30 mL Oral Daily PRN Parks Ranger, DO  OLANZapine zydis (ZYPREXA) disintegrating tablet 5 mg  5 mg Oral BID Parks Ranger, DO   5 mg at 06/08/22 [id number]   traZODone (DESYREL) tablet 100 mg  100 mg Oral QHS PRN Parks Ranger, DO        Lab Results: No results found for this or any previous visit (from the past 48 hour(s)).  Blood Alcohol level:  Lab Results  Component Value Date   ETH <10 06/04/2022   ETH <10 123XX123    Metabolic Disorder Labs: Lab Results  Component Value Date   HGBA1C 5.5 02/15/2021   MPG 102.54 05/13/2020   MPG 123 12/18/2019   Lab Results  Component Value Date   PROLACTIN 27.3 (H) 12/18/2019   PROLACTIN 16.5 06/09/2016   Lab Results  Component Value Date   CHOL 147 03/27/2022   TRIG 89 03/27/2022   HDL 37 (L) 03/27/2022   CHOLHDL 4.0 03/27/2022   VLDL 18 03/27/2022   LDLCALC 92 03/27/2022   LDLCALC 101 (H) 02/15/2021    Physical Findings: AIMS:  , ,  ,  ,    CIWA:    COWS:     Musculoskeletal: Strength & Muscle Tone: within normal limits Gait & Station: normal Patient leans: N/A  Psychiatric Specialty Exam:  Presentation  General Appearance:  Fairly Groomed  Eye Contact: Fair  Speech: Pressured  Speech Volume: Normal  Handedness: Right   Mood and Affect  Mood: Irritable  Affect: Congruent   Thought Process  Thought Processes: Disorganized  Descriptions of  Associations:Tangential  Orientation:Full (Time, Place and Person)  Thought Content:Tangential; Paranoid Ideation  History of Schizophrenia/Schizoaffective disorder:Yes  Duration of Psychotic Symptoms:Greater than six months  Hallucinations:No data recorded Ideas of Reference:Paranoia  Suicidal Thoughts:No data recorded Homicidal Thoughts:No data recorded  Sensorium  Memory: Immediate Fair; Recent Fair  Judgment: Impaired  Insight: Poor   Community education officer  Concentration: Fair  Attention Span: Fair  Recall: [place] of Knowledge: Fair  Language: Fair   Psychomotor Activity  Psychomotor Activity:No data recorded  Assets  Assets: Desire for Improvement; Leisure Time; Physical Health; Resilience; Social Support   Sleep  Sleep:No data recorded   Physical Exam: Physical Exam Vitals and nursing note reviewed.  Constitutional:      Appearance: Normal appearance. She is normal weight.  Neurological:     General: No focal deficit present.     Mental Status: She is alert and oriented to person, place, and time.  Psychiatric:        Attention and Perception: Attention and perception normal.        Mood and Affect: Mood and affect normal.        Speech: Speech normal.        Behavior: Behavior normal. Behavior is cooperative.        Thought Content: Thought content is paranoid.        Cognition and Memory: Cognition and memory normal.        Judgment: Judgment normal.    Review of Systems  Constitutional: Negative.   HENT: Negative.    Eyes: Negative.   Respiratory: Negative.    Cardiovascular: Negative.   Gastrointestinal: Negative.   Genitourinary: Negative.   Musculoskeletal: Negative.   Skin: Negative.   Neurological: Negative.   Endo/Heme/Allergies: Negative.   Psychiatric/Behavioral: Negative.     Blood pressure (!) 162/92, pulse 65, temperature 98.4 F (36.9 C), temperature source Oral, resp. rate 17, height 5\' 7"  (1.702 m),  weight 101.8 kg, last menstrual  period 05/10/2013, SpO2 100 %. Body mass index is 35.16 kg/m.   Treatment Plan Summary: Daily contact with patient to assess and evaluate symptoms and progress in treatment, Medication management, and Plan continue current medications.  [place], DO 06/08/2022, 1:21 PM
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
## 2022-06-09 DIAGNOSIS — F201 Disorganized schizophrenia: Secondary | ICD-10-CM | POA: Diagnosis not present

## 2022-06-09 MED ORDER — CARVEDILOL 6.25 MG PO TABS
12.5000 mg | ORAL_TABLET | Freq: Two times a day (BID) | ORAL | Status: DC
  Administered 2022-06-09 – 2022-06-11 (×4): 12.5 mg via ORAL
  Filled 2022-06-09 (×4): qty 2

## 2022-06-09 NOTE — Plan of Care (Signed)
Pt denies anxiety/depression at this time. Pt denies SI/HI/AVH or pain at this time. Pt is calm and cooperative. Pt is medication compliant. Pt provided with support and encouragement. Pt monitored q15 minutes for safety per unit policy. Plan of care ongoing.   Problem: Activity: Goal: Risk for activity intolerance will decrease Outcome: Progressing   Problem: Nutrition: Goal: Adequate nutrition will be maintained 06/09/2022 1958 by Sharin Mons, RN Outcome: Progressing 06/09/2022 0835 by Sharin Mons, RN Outcome: Progressing   Problem: Coping: Goal: Level of anxiety will decrease Outcome: Progressing

## 2022-06-09 NOTE — Group Note (Signed)
Date:  06/09/2022 Time:  11:31 AM  Group Topic/Focus:  Dimensions of Wellness:   The focus of this group is to introduce the topic of wellness and discuss the role each dimension of wellness plays in total health. Self Care:   The focus of this group is to help patients understand the importance of self-care in order to improve or restore emotional, physical, spiritual, interpersonal, and financial health.    Participation Level:  Did Not Attend   Erin Riley 06/09/2022, 11:31 AM  

## 2022-06-09 NOTE — Progress Notes (Signed)
[facility] MD Progress Note  06/09/2022 11:13 AM Erin Riley  MRN:  [id number] Subjective: May is seen on rounds.  She has very poor insight.  She has been compliant with medications so far.  She does not want to participate in any conversation with nurses or doctors.  Nurses report no issues.  Her blood pressure still a little high but much better.  No side effects from her medication.  She denies any suicidal ideation.  I am not sure what her living situation is.  I believe that social work is said that she has refused for [place] to speak with family. Principal Problem: Schizophrenia, disorganized, chronic with acute exacerbation Diagnosis: Principal Problem:   Schizophrenia, disorganized, chronic with acute exacerbation  Total Time spent with patient: 15 minutes  Past Psychiatric History: Long history of schizophrenia and noncompliance with medications.  Past Medical History:  Past Medical History:  Diagnosis Date   Chronic back pain    Chronic kidney disease (CKD), stage II (mild)    Class I-II   Coronary artery disease 04/2009   50% stenosis in the perforator of LAD; catheterization was for an abnormal Myoview in January 2000 showing anterior and inferolateral ischemia.   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 > 3 subsequent Echos show improved/normal EF with moderate to severe concentric LVH and diastolic dysfunction with LVOT/intracavitary gradient --> 06/2016: Severe LVH.  Vigorous EF, 65-70%.?? Gr 1 DD. Mild AS.   Hyperlipidemia    Hypertension    Hypertensive hypertrophic cardiomyopathy: NYHA class II:  Echo: Severe concentric LVH with LV OT gradient; essentially preserved EF with diastolic dysfunction 02/15/2013   Echo 06/2016: Severe Concentric LVH. Vigorous EF 65-70%. ~ Gr I DD.    Mild aortic stenosis by prior echocardiography    Echo 06/2016: Mild AS (Mean Gradient 15 mmHg);  has had prior Mod-Severe MR (not seen on current echo)   PAD (peripheral artery disease) March 2013   Lower extremity Dopplers: R. SFA 50-60%, R. PTA proximally occluded with distal reconstitution;; L. common iliac ~50%, L. SFA 50-70% stenosis, L. PTA < 50%   Schizophrenia     Past Surgical History:  Procedure Laterality Date   BUNIONECTOMY     carotid doppler  05/29/2011   left bulb/prox ICA moderate amtfibrous plaque with no evidence significant reduction.,right bulb /proximal ICA normal patency   lower extremity doppler  05/29/2011   right SFA 50% to 59% diameter reduction,right posterior tibal atreery occlusive disease,reconstituting distally, left common illiac<50%,left SFA 50 to70%,left post. tibial <50%   NM MYOCAR PERF WALL MOTION  03/2009   Persantine; EF 51%-both anterior and inferolateral ischemia   TRANSTHORACIC ECHOCARDIOGRAM  06/2016   Severe LVH.  Vigorous EF of 65-70%.  No RWMA. ~Only grade 1 diastolic dysfunction.  Mild aortic stenosis (mean gradient 15 mmHg)   TRANSTHORACIC ECHOCARDIOGRAM  07/2012   EF 50-55%; severe concentric LVH; only grade 1 diastolic dysfunction. Mild aortic sclerosis - with LVOT /intracavitary gradient of roughly 20 mmHg mean. Mild to moderately dilated LA;; previously reported MR not seen   Family History:  Family History  Problem Relation Age of Onset   Hypertension Mother    Breast cancer Neg Hx    Family Psychiatric  History: Unremarkable Social History:  Social History   Substance and Sexual Activity  Alcohol Use No     Social History   Substance and Sexual  Activity  Drug Use No    Social History   Socioeconomic History   Marital status: Single    Spouse name: Not on file   Number of children: 7   Years of education: Not on file   Highest education level: Not on file  Occupational History   Not on file  Tobacco Use   Smoking status: Some Days    Types: Cigarettes    Last attempt to quit: 05/14/2002    Years since  quitting: 20.0   Smokeless tobacco: Never  Vaping Use   Vaping Use: Never used  Substance and Sexual Activity   Alcohol use: No   Drug use: No   Sexual activity: Yes    Birth control/protection: Post-menopausal, None  Other Topics Concern   Not on file  Social History Narrative   Now single mother of 2 with one grandchild. She quit smoking roughly 5 years ago and is not so since. She has also stopped drinking alcohol. She does try get routine exercise walking at least a mile 3-4 days a week.    She lives with her 10 year old mother. 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   Social Determinants of Health   Financial Resource Strain: Not on file  Food Insecurity: No Food Insecurity (06/06/2022)   Hunger Vital Sign    Worried About Running Out of Food in the Last Year: Never true    Ran Out of Food in the Last Year: Never true  Transportation Needs: No Transportation Needs (06/06/2022)   PRAPARE - Administrator, Civil Service (Medical): No    Lack of Transportation (Non-Medical): No  Physical Activity: Not on file  Stress: Not on file  Social Connections: Not on file   Additional Social History:                         Sleep: Good  Appetite:  Good  Current Medications: Current Facility-Administered Medications  Medication Dose Route Frequency Provider Last Rate Last Admin   acetaminophen (TYLENOL) tablet 650 mg  650 mg Oral Q6H PRN Sarina Ill, DO       alum & mag hydroxide-simeth (MAALOX/MYLANTA) 200-200-20 MG/5ML suspension 30 mL  30 mL Oral Q4H PRN Sarina Ill, DO       atorvastatin (LIPITOR) tablet 40 mg  40 mg Oral QHS Sarina Ill, DO   40 mg at 06/08/22 2119   carvedilol (COREG) tablet 6.25 mg  6.25 mg Oral BID WC Sarina Ill, DO   6.25 mg at 06/09/22 0800   cloNIDine (CATAPRES) tablet 0.2 mg  0.2 mg Oral Q6H PRN Sarina Ill, DO       furosemide (LASIX) tablet 40 mg  40 mg Oral Daily Sarina Ill, DO   40 mg at 06/09/22 0800   haloperidol (HALDOL) tablet 5 mg  5 mg Oral Q6H PRN Sarina Ill, DO   5 mg at 06/07/22 0800   Or   haloperidol lactate (HALDOL) injection 5 mg  5 mg Intramuscular Q6H PRN Sarina Ill, DO       hydrALAZINE (APRESOLINE) tablet 50 mg  50 mg Oral BID Sarina Ill, DO   50 mg at 06/09/22 0800   isosorbide mononitrate (IMDUR) 24 hr tablet 60 mg  60 mg Oral Daily Sarina Ill, DO   60 mg at 06/09/22 0800   LORazepam (ATIVAN) tablet 1 mg  1 mg Oral Q6H PRN Elane Fritz  Edward, DO   1 mg at 06/07/22 0800   Or   LORazepam (ATIVAN) injection 1 mg  1 mg Intramuscular Q6H PRN Sarina IllHerrick, Chiana Wamser Edward, DO       magnesium hydroxide (MILK OF MAGNESIA) suspension 30 mL  30 mL Oral Daily PRN Sarina IllHerrick, Jeannia Tatro Edward, DO       OLANZapine zydis (ZYPREXA) disintegrating tablet 5 mg  5 mg Oral BID Sarina IllHerrick, Taelor Waymire Edward, DO   5 mg at 06/09/22 0800   traZODone (DESYREL) tablet 100 mg  100 mg Oral QHS PRN Sarina IllHerrick, Audria Takeshita Edward, DO        Lab Results: No results found for this or any previous visit (from the past 48 hour(s)).  Blood Alcohol level:  Lab Results  Component Value Date   ETH <10 06/04/2022   ETH <10 10/07/2021    Metabolic Disorder Labs: Lab Results  Component Value Date   HGBA1C 5.5 02/15/2021   MPG 102.54 05/13/2020   MPG 123 12/18/2019   Lab Results  Component Value Date   PROLACTIN 27.3 (H) 12/18/2019   PROLACTIN 16.5 06/09/2016   Lab Results  Component Value Date   CHOL 147 03/27/2022   TRIG 89 03/27/2022   HDL 37 (L) 03/27/2022   CHOLHDL 4.0 03/27/2022   VLDL 18 03/27/2022   LDLCALC 92 03/27/2022   LDLCALC 101 (H) 02/15/2021    Physical Findings: AIMS:  , ,  ,  ,    CIWA:    COWS:     Musculoskeletal: Strength & Muscle Tone: within normal limits Gait & Station: normal Patient leans: N/A  Psychiatric Specialty Exam:  Presentation  General Appearance:  Fairly  Groomed  Eye Contact: Fair  Speech: Pressured  Speech Volume: Normal  Handedness: Right   Mood and Affect  Mood: Irritable  Affect: Congruent   Thought Process  Thought Processes: Disorganized  Descriptions of Associations:Tangential  Orientation:Full (Time, Place and Person)  Thought Content:Tangential; Paranoid Ideation  History of Schizophrenia/Schizoaffective disorder:Yes  Duration of Psychotic Symptoms:Greater than six months  Hallucinations:No data recorded Ideas of Reference:Paranoia  Suicidal Thoughts:No data recorded Homicidal Thoughts:No data recorded  Sensorium  Memory: Immediate Fair; Recent Fair  Judgment: Impaired  Insight: Poor   Art therapistxecutive Functions  Concentration: Fair  Attention Span: Fair  Recall: Fair  Fund of Knowledge: Fair  Language: Fair   Psychomotor Activity  Psychomotor Activity:No data recorded  Assets  Assets: Desire for Improvement; Leisure Time; Physical Health; Resilience; Social Support   Sleep  Sleep:No data recorded   Physical Exam: Physical Exam Vitals and nursing note reviewed.  Constitutional:      Appearance: Normal appearance. She is normal weight.  Neurological:     General: No focal deficit present.     Mental Status: She is alert and oriented to person, place, and time.  Psychiatric:        Attention and Perception: Attention and perception normal.        Mood and Affect: Mood normal. Affect is labile.        Speech: She is noncommunicative.        Behavior: Behavior is uncooperative, agitated and withdrawn.        Thought Content: Thought content is paranoid and delusional.        Cognition and Memory: Cognition and memory normal.        Judgment: Judgment is inappropriate.    Review of Systems  Constitutional: Negative.   HENT: Negative.    Eyes: Negative.   Respiratory: Negative.  Cardiovascular: Negative.   Gastrointestinal: Negative.   Genitourinary: Negative.    Musculoskeletal: Negative.   Skin: Negative.   Neurological: Negative.   Endo/Heme/Allergies: Negative.   Psychiatric/Behavioral: Negative.     Blood pressure (!) 184/110, pulse 79, temperature 98.3 F (36.8 C), resp. rate 18, height 5\' 7"  (1.702 m), weight 101.8 kg, last menstrual period 05/10/2013, SpO2 100 %. Body mass index is 35.16 kg/m.   Treatment Plan Summary: Daily contact with patient to assess and evaluate symptoms and progress in treatment, Medication management, and Plan increase Coreg to 12.5 mg twice a day.  Continue current medications.  Encourage family contact.  Tandra Rosado Tresea MallEdward Angelis Gates, DO 06/09/2022, 11:13 AM

## 2022-06-09 NOTE — Group Note (Signed)
Recreation Therapy Group Note   Group Topic:Problem Solving  Group Date: 06/09/2022 Start Time: 1400 End Time: 1445 Facilitators: Clinton Gallant, CTRS Location:  Craft Room  Group Description: Life Boat. Patients were given the scenario that they are on a boat that is about to become shipwrecked, leaving them stranded on an [place]. They are asked to make a list of 10 different items that they want to take with them when they are stranded on the [place]. Patients are asked to rank their items from most important to least important, #1 being the most important and #10 being the least. Patients will work individually for the first round to come up with 10 items and then pair up with a peer(s) to condense their list and come up with one list of 10 items between the two of them. Patients or LRT will read aloud the 10 different items to the group after each round. LRT facilitated post-activity processing to discuss how this activity can be used in daily life post discharge.   Affect/Mood: Flat   Participation Level: Non-verbal   Participation Quality: Minimal Cues   Behavior: Calm and Reserved   Speech/Thought Process: N/A   Insight: N/A   Judgement: N/A   Modes of Intervention: Activity   Patient Response to Interventions:  Disengaged   Education Outcome:  In group clarification offered    Clinical Observations/Individualized Feedback: Erin Riley was not active in their participation of session activities and group discussion. Pt came into group, however sat in the back of the room and shared that she was "just listening" and did not want to participate further. Pt was calm and quiet while in group.    Plan: Continue to engage patient in RT group sessions 2-3x/week.   Erin Riley, LRT, CTRS 06/09/2022 2:49 PM

## 2022-06-09 NOTE — Plan of Care (Signed)
Pt denies anxiety/depression at this time. Pt denies SI/HI/AVH or pain at this time. Pt is calm and cooperative. Pt is medication compliant. Pt provided with support and encouragement. Pt monitored q15 minutes for safety per unit policy. Plan of care ongoing.   Problem: Activity: Goal: Risk for activity intolerance will decrease Outcome: Progressing   Problem: Nutrition: Goal: Adequate nutrition will be maintained Outcome: Progressing   

## 2022-06-10 DIAGNOSIS — F201 Disorganized schizophrenia: Secondary | ICD-10-CM | POA: Diagnosis not present

## 2022-06-10 NOTE — Plan of Care (Signed)
  Problem: Education: Goal: Knowledge of General Education information will improve Description: Including pain rating scale, medication(s)/side effects and non-pharmacologic comfort measures Outcome: Progressing   Problem: Health Behavior/Discharge Planning: Goal: Ability to manage health-related needs will improve Outcome: Progressing   Problem: Clinical Measurements: Goal: Will remain free from infection Outcome: Progressing   Problem: Activity: Goal: Risk for activity intolerance will decrease Outcome: Progressing   Problem: Nutrition: Goal: Adequate nutrition will be maintained Outcome: Progressing   Problem: Coping: Goal: Level of anxiety will decrease Outcome: Progressing   Problem: Education: Goal: Knowledge of [place] General Education information/materials will improve Outcome: Progressing Goal: Emotional status will improve Outcome: Progressing Goal: Mental status will improve Outcome: Progressing   Problem: Activity: Goal: Interest or engagement in activities will improve Outcome: Progressing

## 2022-06-10 NOTE — Progress Notes (Signed)
Pt had lab work drawn this morning.

## 2022-06-10 NOTE — Group Note (Signed)
Date:  06/10/2022 Time:  6:10 PM  Group Topic/Focus:  Movement group    Participation Level:  Did Not Attend  Participation Quality:   None  Affect:   Did Not Attend  Cognitive:   Did Not Attend  Insight: None  Engagement in Group:  None  Modes of Intervention:  Activity and Socialization  Additional Comments:  movement group for stress management and relaxation  Hall Busing 06/10/2022, 6:10 PM

## 2022-06-10 NOTE — BH Assessment (Signed)
1905 Received patient sitting in the dayroom alert she is partially looking at TV and looking around. Patient not viewed engaging in any conversation with others. Will continue to monitor patient for safety.  2000 Patient is alert and oriented x3, she is currently denying SI/HI, A/V hallucinations, depression, anxiety and pain. Patient is verbally combative at times and is intrusive regarding the care of other patients. Patient is requesting infoirmation about other patient's. Patient informed of the HIPAA policy and law.  2300 Patient is medication compliant and received medication education. Patient can exhibit aggressive speech but is easily redirected. Will continue ot monitor patient for safety.  0330 Patient awaken requesting a pair of hospital undergarments reporting that " Someone came in my room and part chocolate in my under ware; so I need another pair." Patient was given a pair of hospital provided under ware. She returned the pair of undergarments because somebody did something to them.  Patient is showing signs of paranoia and very guarded.  0630 Patient has rested quietly in bed for the last  three hours. No more paranoid behavior. Will continue to monitor patient for safety.

## 2022-06-10 NOTE — Progress Notes (Signed)
[facility]  06/10/2022 10:20 AM Erin Riley  MRN:  [id number] Subjective: Erin Riley is seen on rounds.  She has been isolative to her room.  She is unwilling to give any information about her living situation.  She states she needs to talk to her son.  She has been compliant with medications.  No side effects.  Nurses report no issues.  6 Principal Problem: Schizophrenia, disorganized, chronic with acute exacerbation Diagnosis: Principal Problem:   Schizophrenia, disorganized, chronic with acute exacerbation  Total Time spent with patient: 15 minutes  Past Psychiatric History: History of schizophrenia  Past Medical History:  Past Medical History:  Diagnosis Date   Chronic back pain    Chronic kidney disease (CKD), stage II (mild)    Class I-II   Coronary artery disease 04/2009   50% stenosis in the perforator of LAD; catheterization was for an abnormal Myoview in January 2000 showing anterior and inferolateral ischemia.   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 > 3 subsequent Echos show improved/normal EF with moderate to severe concentric LVH and diastolic dysfunction with LVOT/intracavitary gradient --> 06/2016: Severe LVH.  Vigorous EF, 65-70%.?? Gr 1 DD. Mild AS.   Hyperlipidemia    Hypertension    Hypertensive hypertrophic cardiomyopathy: NYHA class II:  Echo: Severe concentric LVH with LV OT gradient; essentially preserved EF with diastolic dysfunction 02/15/2013   Echo 06/2016: Severe Concentric LVH. Vigorous EF 65-70%. ~ Gr I DD.    Mild aortic stenosis by prior echocardiography    Echo 06/2016: Mild AS (Mean Gradient 15 mmHg); has had prior Mod-Severe MR (not seen on current echo)   PAD (peripheral artery disease) March 2013   Lower extremity Dopplers: R. SFA 50-60%, R. PTA proximally occluded with distal reconstitution;; L. common iliac ~50%, L. SFA 50-70%  stenosis, L. PTA < 50%   Schizophrenia     Past Surgical History:  Procedure Laterality Date   BUNIONECTOMY     carotid doppler  05/29/2011   left bulb/prox ICA moderate amtfibrous plaque with no evidence significant reduction.,right bulb /proximal ICA normal patency   lower extremity doppler  05/29/2011   right SFA 50% to 59% diameter reduction,right posterior tibal atreery occlusive disease,reconstituting distally, left common illiac<50%,left SFA 50 to70%,left post. tibial <50%   NM MYOCAR PERF WALL MOTION  03/2009   Persantine; EF 51%-both anterior and inferolateral ischemia   TRANSTHORACIC ECHOCARDIOGRAM  06/2016   Severe LVH.  Vigorous EF of 65-70%.  No RWMA. ~Only grade 1 diastolic dysfunction.  Mild aortic stenosis (mean gradient 15 mmHg)   TRANSTHORACIC ECHOCARDIOGRAM  07/2012   EF 50-55%; severe concentric LVH; only grade 1 diastolic dysfunction. Mild aortic sclerosis - with LVOT /intracavitary gradient of roughly 20 mmHg mean. Mild to moderately dilated LA;; previously reported MR not seen   Family History:  Family History  Problem Relation Age of Onset   Hypertension Mother    Breast cancer Neg Hx    Family Psychiatric  History: Unremarkable Social History:  Social History   Substance and Sexual Activity  Alcohol Use No     Social History   Substance and Sexual Activity  Drug Use No    Social History   Socioeconomic History   Marital status: Single    Spouse name: Not on file   Number of children: 7   Years of education: Not on file  Highest education level: Not on file  Occupational History   Not on file  Tobacco Use   Smoking status: Some Days    Types: Cigarettes    Last attempt to quit: 05/14/2002    Years since quitting: 20.0   Smokeless tobacco: Never  Vaping Use   Vaping Use: Never used  Substance and Sexual Activity   Alcohol use: No   Drug use: No   Sexual activity: Yes    Birth control/protection: Post-menopausal, None  Other Topics  Concern   Not on file  Social History Narrative   Now single mother of 2 with one grandchild. She quit smoking roughly 5 years ago and is not so since. She has also stopped drinking alcohol. She does try get routine exercise walking at least a mile 3-4 days a week.    She lives with her 92 year old mother. 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   Social Determinants of Health   Financial Resource Strain: Not on file  Food Insecurity: No Food Insecurity (06/06/2022)   Hunger Vital Sign    Worried About Running Out of Food in the Last Year: Never true    Ran Out of Food in the Last Year: Never true  Transportation Needs: No Transportation Needs (06/06/2022)   PRAPARE - Administrator, Civil Service (Medical): No    Lack of Transportation (Non-Medical): No  Physical Activity: Not on file  Stress: Not on file  Social Connections: Not on file   Additional Social History:                         Sleep: Good  Appetite:  Good  Current Medications: Current Facility-Administered Medications  Medication Dose Route Frequency Provider Last Rate Last Admin   acetaminophen (TYLENOL) tablet 650 mg  650 mg Oral Q6H PRN Sarina Ill, DO       alum & mag hydroxide-simeth (MAALOX/MYLANTA) 200-200-20 MG/5ML suspension 30 mL  30 mL Oral Q4H PRN Sarina Ill, DO       atorvastatin (LIPITOR) tablet 40 mg  40 mg Oral QHS Sarina Ill, DO   40 mg at 06/09/22 2011   carvedilol (COREG) tablet 12.5 mg  12.5 mg Oral BID WC Sarina Ill, DO   12.5 mg at 06/10/22 0804   cloNIDine (CATAPRES) tablet 0.2 mg  0.2 mg Oral Q6H PRN Sarina Ill, DO       furosemide (LASIX) tablet 40 mg  40 mg Oral Daily Sarina Ill, DO   40 mg at 06/10/22 0804   haloperidol (HALDOL) tablet 5 mg  5 mg Oral Q6H PRN Sarina Ill, DO   5 mg at 06/07/22 0800   Or   haloperidol lactate (HALDOL) injection 5 mg  5 mg Intramuscular Q6H PRN Sarina Ill, DO       hydrALAZINE (APRESOLINE) tablet 50 mg  50 mg Oral BID Sarina Ill, DO   50 mg at 06/10/22 0804   isosorbide mononitrate (IMDUR) 24 hr tablet 60 mg  60 mg Oral Daily Sarina Ill, DO   60 mg at 06/10/22 0804   LORazepam (ATIVAN) tablet 1 mg  1 mg Oral Q6H PRN Sarina Ill, DO   1 mg at 06/07/22 0800   Or   LORazepam (ATIVAN) injection 1 mg  1 mg Intramuscular Q6H PRN Sarina Ill, DO       magnesium hydroxide (MILK OF MAGNESIA) suspension 30 mL  30 mL Oral Daily PRN Sarina Ill, DO       OLANZapine zydis [facility]) disintegrating tablet 5 mg  5 mg Oral BID Sarina Ill, DO   5 mg at 06/10/22 0804   traZODone (DESYREL) tablet 100 mg  100 mg Oral QHS PRN Sarina Ill, DO        Lab Results: No results found for this or any previous visit (from the past 48 hour(s)).  Blood Alcohol level:  Lab Results  Component Value Date   ETH <10 06/04/2022   ETH <10 10/07/2021    Metabolic Disorder Labs: Lab Results  Component Value Date   HGBA1C 5.5 02/15/2021   MPG 102.54 05/13/2020   MPG 123 12/18/2019   Lab Results  Component Value Date   PROLACTIN 27.3 (H) 12/18/2019   PROLACTIN 16.5 06/09/2016   Lab Results  Component Value Date   CHOL 147 03/27/2022   TRIG 89 03/27/2022   HDL 37 (L) 03/27/2022   CHOLHDL 4.0 03/27/2022   VLDL 18 03/27/2022   LDLCALC 92 03/27/2022   LDLCALC 101 (H) 02/15/2021    Physical Findings: AIMS:  , ,  ,  ,    CIWA:    COWS:     Musculoskeletal: Strength & Muscle Tone: within normal limits Gait & Station: normal Patient leans: N/A  Psychiatric Specialty Exam:  Presentation  General Appearance:  Fairly Groomed  Eye Contact: Fair  Speech: Pressured  Speech Volume: Normal  Handedness: Right   Mood and Affect  Mood: Irritable  Affect: Congruent   Thought Process  Thought Processes: Disorganized  Descriptions of  Associations:Tangential  Orientation:Full (Time, Place and Person)  Thought Content:Tangential; Paranoid Ideation  History of Schizophrenia/Schizoaffective disorder:Yes  Duration of Psychotic Symptoms:Greater than six months  Hallucinations:No data recorded Ideas of Reference:Paranoia  Suicidal Thoughts:No data recorded Homicidal Thoughts:No data recorded  Sensorium  Memory: Immediate Fair; Recent Fair  Judgment: Impaired  Insight: Poor   Art therapist  Concentration: Fair  Attention Span: Fair  Recall: Fair  Fund of Knowledge: Fair  Language: Fair   Psychomotor Activity  Psychomotor Activity:No data recorded  Assets  Assets: Desire for Improvement; Leisure Time; Physical Health; Resilience; Social Support   Sleep  Sleep:No data recorded    Blood pressure (!) 174/75, pulse 69, temperature 97.9 F (36.6 C), resp. rate 18, height 5\' 7"  (1.702 m), weight 101.8 kg, last menstrual period 05/10/2013, SpO2 99 %. Body mass index is 35.16 kg/m.   Treatment Plan Summary: Daily contact with patient to assess and evaluate symptoms and progress in treatment, Medication management, and Plan continue current medications.  Jashanti Clinkscale Tresea Mall, DO 06/10/2022, 10:20 AM

## 2022-06-10 NOTE — [facility] Group Notes (Signed)
LCSW Wellness Group Note   06/10/2022 1:00pm  Type of Group and Topic: Psychoeducational Group:  Wellness  Participation Level:  did not attend  Description of Group  Wellness group introduces the topic and its focus on developing healthy habits across the spectrum and its relationship to a decrease in hospital admissions.  Six areas of wellness are discussed: physical, social spiritual, intellectual, occupational, and emotional.  Patients are asked to consider their current wellness habits and to identify areas of wellness where they are interested and able to focus on improvements.    Therapeutic Goals Patients will understand components of wellness and how they can positively impact overall health.  Patients will identify areas of wellness where they have developed good habits. Patients will identify areas of wellness where they would like to make improvements.    Summary of Patient Progress     Therapeutic Modalities: Cognitive Behavioral Therapy Psychoeducation    Clotile Whittington Jon, LCSW 

## 2022-06-10 NOTE — Progress Notes (Signed)
D- Patient alert and oriented x 3. Patient disoriented to situation. Patient presents with a pleasant and labile mood and affect this morning. Patient denies SI, HI, AVH, and pain. Patient states her goal today it to work on learning how to relax. Writer provided education and material to patient on relaxation techniques.   A- Scheduled medications administered to patient, per MD orders. Support and encouragement provided.  Routine safety checks conducted every 15 minutes.  Patient informed to notify staff with problems or concerns.  R- No adverse drug reactions noted. Patient contracts for safety at this time. Patient compliant with medications and treatment plan. Patient receptive, calm, and cooperative. Patient interacts well with others on the unit.  Patient remains safe at this time.    06/10/22 0826  Charting Type  Charting Type Shift assessment  Safety Check Verification  Has the RN verified the 15 minute safety check completion? Yes  Neurological  Neuro (WDL) X  Orientation Level Disoriented to situation;Oriented to person;Oriented to place;Oriented to time  Cognition Impulsive;Poor attention/concentration  Neuro Symptoms None  HEENT  HEENT (WDL) WDL  Voice Clear  Respiratory  Respiratory (WDL) WDL  Cough None  Respiratory Pattern Regular;Unlabored  Chest Assessment Chest expansion symmetrical  Cardiac  Cardiac (WDL) WDL  Vascular  Vascular (WDL) WDL  Integumentary  Integumentary (WDL) WDL  Skin Color Appropriate for ethnicity  Braden Scale (Ages 8 and up)  Sensory Perceptions 4  Moisture 4  Activity 3  Mobility 3  Nutrition 4  Friction and Shear 3  Braden Scale Score 21  Musculoskeletal  Musculoskeletal (WDL) WDL  Assistive Device None  Gastrointestinal  Gastrointestinal (WDL) WDL  Last BM Date  06/09/22  GU Assessment  Genitourinary (WDL) WDL  Neurological  Level of Consciousness Alert

## 2022-06-11 DIAGNOSIS — F201 Disorganized schizophrenia: Secondary | ICD-10-CM | POA: Diagnosis not present

## 2022-06-11 MED ORDER — CARVEDILOL 6.25 MG PO TABS
25.0000 mg | ORAL_TABLET | Freq: Two times a day (BID) | ORAL | Status: DC
  Administered 2022-06-11 – 2022-06-14 (×6): 25 mg via ORAL
  Filled 2022-06-11 (×6): qty 4

## 2022-06-11 NOTE — Progress Notes (Signed)
[facility] MD Progress Note  06/11/2022 12:22 PM [name]  MRN:  [id number] Subjective: Erin Riley is seen on rounds.  She has been compliant with medications.  No side effects.  Nurses report no issues.  I asked her if I could talk to her son and she said yes.  She says that he lives about 5 minutes from her in [place].  She has no complaints.  She says that she does take her medication at home. Principal Problem: Schizophrenia, disorganized, chronic with acute exacerbation Diagnosis: Principal Problem:   Schizophrenia, disorganized, chronic with acute exacerbation  Total Time spent with patient: 15 minutes  Past Psychiatric History: Schizophrenia  Past Medical History:  Past Medical History:  Diagnosis Date   Chronic back pain    Chronic kidney disease (CKD), stage II (mild)    Class I-II   Coronary artery disease 04/2009   50% stenosis in the perforator of LAD; catheterization was for an abnormal Myoview in January 2000 showing anterior and inferolateral ischemia.   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 > 3 subsequent Echos show improved/normal EF with moderate to severe concentric LVH and diastolic dysfunction with LVOT/intracavitary gradient --> 06/2016: Severe LVH.  Vigorous EF, 65-70%.?? Gr 1 DD. Mild AS.   Hyperlipidemia    Hypertension    Hypertensive hypertrophic cardiomyopathy: NYHA class II:  Echo: Severe concentric LVH with LV OT gradient; essentially preserved EF with diastolic dysfunction 02/15/2013   Echo 06/2016: Severe Concentric LVH. Vigorous EF 65-70%. ~ Gr I DD.    Mild aortic stenosis by prior echocardiography    Echo 06/2016: Mild AS (Mean Gradient 15 mmHg); has had prior Mod-Severe MR (not seen on current echo)   PAD (peripheral artery disease) March 2013   Lower extremity Dopplers: R. SFA 50-60%, R. PTA proximally occluded with distal reconstitution;; L.  common iliac ~50%, L. SFA 50-70% stenosis, L. PTA < 50%   Schizophrenia     Past Surgical History:  Procedure Laterality Date   BUNIONECTOMY     carotid doppler  05/29/2011   left bulb/prox ICA moderate amtfibrous plaque with no evidence significant reduction.,right bulb /proximal ICA normal patency   lower extremity doppler  05/29/2011   right SFA 50% to 59% diameter reduction,right posterior tibal atreery occlusive disease,reconstituting distally, left common illiac<50%,left SFA 50 to70%,left post. tibial <50%   NM MYOCAR PERF WALL MOTION  03/2009   Persantine; EF 51%-both anterior and inferolateral ischemia   TRANSTHORACIC ECHOCARDIOGRAM  06/2016   Severe LVH.  Vigorous EF of 65-70%.  No RWMA. ~Only grade 1 diastolic dysfunction.  Mild aortic stenosis (mean gradient 15 mmHg)   TRANSTHORACIC ECHOCARDIOGRAM  07/2012   EF 50-55%; severe concentric LVH; only grade 1 diastolic dysfunction. Mild aortic sclerosis - with LVOT /intracavitary gradient of roughly 20 mmHg mean. Mild to moderately dilated LA;; previously reported MR not seen   Family History:  Family History  Problem Relation Age of Onset   Hypertension Mother    Breast cancer Neg Hx    Family Psychiatric  History: Unremarkable Social History:  Social History   Substance and Sexual Activity  Alcohol Use No     Social History   Substance and Sexual Activity  Drug Use No    Social History   Socioeconomic History   Marital status: Single    Spouse name: Not on file   Number of children: 7  Years of education: Not on file   Highest education level: Not on file  Occupational History   Not on file  Tobacco Use   Smoking status: Some Days    Types: Cigarettes    Last attempt to quit: 05/14/2002    Years since quitting: 20.0   Smokeless tobacco: Never  Vaping Use   Vaping Use: Never used  Substance and Sexual Activity   Alcohol use: No   Drug use: No   Sexual activity: Yes    Birth control/protection:  Post-menopausal, None  Other Topics Concern   Not on file  Social History Narrative   Now single mother of 2 with one grandchild. She quit smoking roughly 5 years ago and is not so since. She has also stopped drinking alcohol. She does try get routine exercise walking at least a mile 3-4 days a week.    She lives with her 17 year old mother. 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   Social Determinants of Health   Financial Resource Strain: Not on file  Food Insecurity: No Food Insecurity (06/06/2022)   Hunger Vital Sign    Worried About Running Out of Food in the Last Year: Never true    Ran Out of Food in the Last Year: Never true  Transportation Needs: No Transportation Needs (06/06/2022)   PRAPARE - Administrator, Civil Service (Medical): No    Lack of Transportation (Non-Medical): No  Physical Activity: Not on file  Stress: Not on file  Social Connections: Not on file   Additional Social History:                         Sleep: Good  Appetite:  Good  Current Medications: Current Facility-Administered Medications  Medication Dose Route Frequency Provider Last Rate Last Admin   acetaminophen (TYLENOL) tablet 650 mg  650 mg Oral Q6H PRN Sarina Ill, DO       alum & mag hydroxide-simeth (MAALOX/MYLANTA) 200-200-20 MG/5ML suspension 30 mL  30 mL Oral Q4H PRN Sarina Ill, DO       atorvastatin (LIPITOR) tablet 40 mg  40 mg Oral QHS Sarina Ill, DO   40 mg at 06/10/22 2127   carvedilol (COREG) tablet 25 mg  25 mg Oral BID WC Sarina Ill, DO       cloNIDine (CATAPRES) tablet 0.2 mg  0.2 mg Oral Q6H PRN Sarina Ill, DO       furosemide (LASIX) tablet 40 mg  40 mg Oral Daily Sarina Ill, DO   40 mg at 06/11/22 0819   haloperidol (HALDOL) tablet 5 mg  5 mg Oral Q6H PRN Sarina Ill, DO   5 mg at 06/07/22 0800   Or   haloperidol lactate (HALDOL) injection 5 mg  5 mg Intramuscular Q6H PRN  Sarina Ill, DO       hydrALAZINE (APRESOLINE) tablet 50 mg  50 mg Oral BID Sarina Ill, DO   50 mg at 06/11/22 0819   isosorbide mononitrate (IMDUR) 24 hr tablet 60 mg  60 mg Oral Daily Sarina Ill, DO   60 mg at 06/11/22 0819   LORazepam (ATIVAN) tablet 1 mg  1 mg Oral Q6H PRN Sarina Ill, DO   1 mg at 06/07/22 0800   Or   LORazepam (ATIVAN) injection 1 mg  1 mg Intramuscular Q6H PRN Sarina Ill, DO       magnesium hydroxide (MILK  OF MAGNESIA) suspension 30 mL  30 mL Oral Daily PRN Sarina IllHerrick, Kiaira Pointer Edward, DO       OLANZapine zydis (ZYPREXA) disintegrating tablet 5 mg  5 mg Oral BID Sarina IllHerrick, Bently Morath Edward, DO   5 mg at 06/11/22 16100819   traZODone (DESYREL) tablet 100 mg  100 mg Oral QHS PRN Sarina IllHerrick, Ventura Hollenbeck Edward, DO        Lab Results: No results found for this or any previous visit (from the past 48 hour(s)).  Blood Alcohol level:  Lab Results  Component Value Date   ETH <10 06/04/2022   ETH <10 10/07/2021    Metabolic Disorder Labs: Lab Results  Component Value Date   HGBA1C 5.5 02/15/2021   MPG 102.54 05/13/2020   MPG 123 12/18/2019   Lab Results  Component Value Date   PROLACTIN 27.3 (H) 12/18/2019   PROLACTIN 16.5 06/09/2016   Lab Results  Component Value Date   CHOL 147 03/27/2022   TRIG 89 03/27/2022   HDL 37 (L) 03/27/2022   CHOLHDL 4.0 03/27/2022   VLDL 18 03/27/2022   LDLCALC 92 03/27/2022   LDLCALC 101 (H) 02/15/2021    Physical Findings: AIMS:  , ,  ,  ,    CIWA:    COWS:     Musculoskeletal: Strength & Muscle Tone: within normal limits Gait & Station: normal Patient leans: N/A  Psychiatric Specialty Exam:  Presentation  General Appearance:  Fairly Groomed  Eye Contact: Fair  Speech: Pressured  Speech Volume: Normal  Handedness: Right   Mood and Affect  Mood: Irritable  Affect: Congruent   Thought Process  Thought Processes: Disorganized  Descriptions of  Associations:Tangential  Orientation:Full (Time, Place and Person)  Thought Content:Tangential; Paranoid Ideation  History of Schizophrenia/Schizoaffective disorder:Yes  Duration of Psychotic Symptoms:Greater than six months  Hallucinations:No data recorded Ideas of Reference:Paranoia  Suicidal Thoughts:No data recorded Homicidal Thoughts:No data recorded  Sensorium  Memory: Immediate Fair; Recent Fair  Judgment: Impaired  Insight: Poor   Art therapistxecutive Functions  Concentration: Fair  Attention Span: Fair  Recall: Fair  Fund of Knowledge: Fair  Language: Fair   Psychomotor Activity  Psychomotor Activity:No data recorded  Assets  Assets: Desire for Improvement; Leisure Time; Physical Health; Resilience; Social Support   Sleep  Sleep:No data recorded    Blood pressure (!) 156/118, pulse 76, temperature 98.3 F (36.8 C), temperature source Oral, resp. rate 18, height 5\' 7"  (1.702 m), weight 101.8 kg, last menstrual period 05/10/2013, SpO2 99 %. Body mass index is 35.16 kg/m.   Treatment Plan Summary: Daily contact with patient to assess and evaluate symptoms and progress in treatment, Medication management, and Plan continue current medications.  Increase Coreg to 25 mg because her blood pressure continues to be high.  Sarina Illichard Edward Eural Holzschuh, DO 06/11/2022, 12:22 PM

## 2022-06-11 NOTE — Plan of Care (Signed)
Patient stayed actively in the milieu but was irritable at times. Became more and more agitated and was given an antianxiety medication. Patient participated in activities, had medications and scacks then went to bed later. Currently sleeping without any sign of distress .

## 2022-06-11 NOTE — Progress Notes (Signed)
Pt at nurse's station saying that someone took her pants. "I put a black dot on my pants. I need the long ones. These are not my pants. They gave them to some short lady." Pt informed that no one has bothered her clothes. "I'll sit right here until the next shift comes in then." Pt sitting by the phone. Pt redirected to wait in her room because it is early in the morning. Pt returned to her room.

## 2022-06-11 NOTE — Plan of Care (Incomplete)
Patient stayed

## 2022-06-11 NOTE — Progress Notes (Signed)
   06/11/22 0736  Psych Admission Type (Psych Patients Only)  Admission Status Involuntary  Psychosocial Assessment  Patient Complaints None  Eye Contact Fair  Facial Expression Animated  Affect Appropriate to circumstance  Speech Loud  Interaction Assertive;Demanding  Motor Activity Slow  Appearance/Hygiene In scrubs  Behavior Characteristics Irritable  Mood Labile  Thought Process  Coherency Disorganized  Content Paranoia  Delusions Paranoid  Perception WDL  Hallucination Auditory  Judgment Impaired  Confusion Mild  Danger to Self  Current suicidal ideation? Denies  Danger to Others  Danger to Others None reported or observed

## 2022-06-11 NOTE — Plan of Care (Signed)
  Problem: Education: Goal: Knowledge of General Education information will improve Description: Including pain rating scale, medication(s)/side effects and non-pharmacologic comfort measures Outcome: Not Progressing   Problem: Health Behavior/Discharge Planning: Goal: Ability to manage health-related needs will improve Outcome: Not Progressing   Problem: Clinical Measurements: Goal: Ability to maintain clinical measurements within normal limits will improve Outcome: Not Progressing Goal: Will remain free from infection Outcome: Not Progressing Goal: Diagnostic test results will improve Outcome: Not Progressing Goal: Respiratory complications will improve Outcome: Not Progressing Goal: Cardiovascular complication will be avoided Outcome: Not Progressing   Problem: Activity: Goal: Risk for activity intolerance will decrease Outcome: Not Progressing   Problem: Nutrition: Goal: Adequate nutrition will be maintained Outcome: Not Progressing   Problem: Coping: Goal: Level of anxiety will decrease Outcome: Not Progressing   Problem: Elimination: Goal: Will not experience complications related to bowel motility Outcome: Not Progressing Goal: Will not experience complications related to urinary retention Outcome: Not Progressing   Problem: Pain Managment: Goal: General experience of comfort will improve Outcome: Not Progressing   Problem: Coping: Goal: Coping ability will improve Outcome: Not Progressing Goal: Will verbalize feelings Outcome: Not Progressing   Problem: Education: Goal: Will be free of psychotic symptoms Outcome: Not Progressing Goal: Knowledge of the prescribed therapeutic regimen will improve Outcome: Not Progressing   Problem: Activity: Goal: Will verbalize the importance of balancing activity with adequate rest periods Outcome: Not Progressing

## 2022-06-12 DIAGNOSIS — F201 Disorganized schizophrenia: Secondary | ICD-10-CM | POA: Diagnosis not present

## 2022-06-12 LAB — HEMOGLOBIN A1C
Hgb A1c MFr Bld: 5.8 % — ABNORMAL HIGH (ref 4.8–5.6)
Mean Plasma Glucose: 120 mg/dL

## 2022-06-12 MED ORDER — POTASSIUM CHLORIDE CRYS ER 10 MEQ PO TBCR
10.0000 meq | EXTENDED_RELEASE_TABLET | Freq: Every day | ORAL | Status: DC
  Administered 2022-06-12 – 2022-06-14 (×3): 10 meq via ORAL
  Filled 2022-06-12 (×3): qty 1

## 2022-06-12 NOTE — Plan of Care (Signed)
Patient  has stayed in the milieu and has remained pleasant and cooperative. Alert and oriented x 4. Patient denies SI/HI. Denies hallucinations. She does not appear to be preoccupied or responding to internal stimuli. Patient reports that she had a good day. Her appetite is good. She does not appear to be in distress. Patient ate a snack and received her HS medications. She is currently sitting in the day room. Has no sign of distress.

## 2022-06-12 NOTE — [facility] Suicide Risk Assessment (Signed)
[facility] INPATIENT:  Family/Significant Other Suicide Prevention Education  Suicide Prevention Education:  Contact Attempts: [name], son, [phone] has been identified by the patient as the family member/significant other with whom the patient will be residing, and identified as the person(s) who will aid the patient in the event of a mental health crisis.  With written consent from the patient, two attempts were made to provide suicide prevention education, prior to and/or following the patient's discharge.  We were unsuccessful in providing suicide prevention education.  A suicide education pamphlet was given to the patient to share with family/significant other.  Date and time of first attempt:06/11/2021 1015AM Date and time of second attempt: Second attempt is needed.  CSW left HIPAA compliant voicemail.  Harden Mo 06/12/2022, 10:16 AM

## 2022-06-12 NOTE — Group Note (Signed)
LCSW Group Therapy Note  Group Date: 06/12/2022 Start Time: 1300 End Time: 1340   Type of Therapy and Topic:  Group Therapy - Healthy vs Unhealthy Coping Skills  Participation Level:  Did Not Attend   Description of Group The focus of this group was to determine what unhealthy coping techniques typically are used by group members and what healthy coping techniques would be helpful in coping with various problems. Patients were guided in becoming aware of the differences between healthy and unhealthy coping techniques. Patients were asked to identify 2-3 healthy coping skills they would like to learn to use more effectively.  Therapeutic Goals Patients learned that coping is what human beings do all day long to deal with various situations in their lives Patients defined and discussed healthy vs unhealthy coping techniques Patients identified their preferred coping techniques and identified whether these were healthy or unhealthy Patients determined 2-3 healthy coping skills they would like to become more familiar with and use more often. Patients provided support and ideas to each other   Summary of Patient Progress:   Patient left group stating "I ain't going to no group". Patient had been sitting in day room watching television.  Therapeutic Modalities Cognitive Behavioral Therapy Motivational Interviewing  Claudie Fisherman 06/12/2022  3:39 PM

## 2022-06-12 NOTE — Progress Notes (Signed)
Patient slept throughout the night and had no sign of distress. Safety monired per 15 min protocol.

## 2022-06-12 NOTE — Progress Notes (Signed)
[facility] MD Progress Note  06/12/2022 9:42 AM Erin Riley  MRN:  [id number] Subjective: Erin Riley is seen on rounds.  She is social work permission to talk to her son.  She has been pleasant and cooperative.  Her blood pressure is still high.  I went up on her Coreg yesterday but she did not receive it.  She has been compliant with her medication.  She states that her mood is good and she is sleeping well.  She is on Lasix and her potassium on admission was 3.5 cm, started on her regular dose of potassium 10 mcg a day.  She does not have a psychiatrist so social work can set her up with one in [place].  She denies any side effects from her medication. Principal Problem: Schizophrenia, disorganized, chronic with acute exacerbation Diagnosis: Principal Problem:   Schizophrenia, disorganized, chronic with acute exacerbation  Total Time spent with patient: 15 minutes  Past Psychiatric History: Long history of schizophrenia and noncompliance.  She also has a history of hypertension, chronic kidney disease, aortic stenosis and cardiomyopathy.  Past Medical History:  Past Medical History:  Diagnosis Date   Chronic back pain    Chronic kidney disease (CKD), stage II (mild)    Class I-II   Coronary artery disease 04/2009   50% stenosis in the perforator of LAD; catheterization was for an abnormal Myoview in January 2000 showing anterior and inferolateral ischemia.   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 > 3 subsequent Echos show improved/normal EF with moderate to severe concentric LVH and diastolic dysfunction with LVOT/intracavitary gradient --> 06/2016: Severe LVH.  Vigorous EF, 65-70%.?? Gr 1 DD. Mild AS.   Hyperlipidemia    Hypertension    Hypertensive hypertrophic cardiomyopathy: NYHA class II:  Echo: Severe concentric LVH with LV OT gradient; essentially preserved EF with diastolic  dysfunction 02/15/2013   Echo 06/2016: Severe Concentric LVH. Vigorous EF 65-70%. ~ Gr I DD.    Mild aortic stenosis by prior echocardiography    Echo 06/2016: Mild AS (Mean Gradient 15 mmHg); has had prior Mod-Severe MR (not seen on current echo)   PAD (peripheral artery disease) March 2013   Lower extremity Dopplers: R. SFA 50-60%, R. PTA proximally occluded with distal reconstitution;; L. common iliac ~50%, L. SFA 50-70% stenosis, L. PTA < 50%   Schizophrenia     Past Surgical History:  Procedure Laterality Date   BUNIONECTOMY     carotid doppler  05/29/2011   left bulb/prox ICA moderate amtfibrous plaque with no evidence significant reduction.,right bulb /proximal ICA normal patency   lower extremity doppler  05/29/2011   right SFA 50% to 59% diameter reduction,right posterior tibal atreery occlusive disease,reconstituting distally, left common illiac<50%,left SFA 50 to70%,left post. tibial <50%   NM MYOCAR PERF WALL MOTION  03/2009   Persantine; EF 51%-both anterior and inferolateral ischemia   TRANSTHORACIC ECHOCARDIOGRAM  06/2016   Severe LVH.  Vigorous EF of 65-70%.  No RWMA. ~Only grade 1 diastolic dysfunction.  Mild aortic stenosis (mean gradient 15 mmHg)   TRANSTHORACIC ECHOCARDIOGRAM  07/2012   EF 50-55%; severe concentric LVH; only grade 1 diastolic dysfunction. Mild aortic sclerosis - with LVOT /intracavitary gradient of roughly 20 mmHg mean. Mild to moderately dilated LA;; previously reported MR not seen   Family History:  Family History  Problem Relation Age of Onset   Hypertension Mother  Breast cancer Neg Hx    Family Psychiatric  History: Unremarkable Social History:  Social History   Substance and Sexual Activity  Alcohol Use No     Social History   Substance and Sexual Activity  Drug Use No    Social History   Socioeconomic History   Marital status: Single    Spouse name: Not on file   Number of children: 7   Years of education: Not on file    Highest education level: Not on file  Occupational History   Not on file  Tobacco Use   Smoking status: Some Days    Types: Cigarettes    Last attempt to quit: 05/14/2002    Years since quitting: 20.0   Smokeless tobacco: Never  Vaping Use   Vaping Use: Never used  Substance and Sexual Activity   Alcohol use: No   Drug use: No   Sexual activity: Yes    Birth control/protection: Post-menopausal, None  Other Topics Concern   Not on file  Social History Narrative   Now single mother of 2 with one grandchild. She quit smoking roughly 5 years ago and is not so since. She has also stopped drinking alcohol. She does try get routine exercise walking at least a mile 3-4 days a week.    She lives with her 78 year old mother. 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   Social Determinants of Health   Financial Resource Strain: Not on file  Food Insecurity: No Food Insecurity (06/06/2022)   Hunger Vital Sign    Worried About Running Out of Food in the Last Year: Never true    Ran Out of Food in the Last Year: Never true  Transportation Needs: No Transportation Needs (06/06/2022)   PRAPARE - Administrator, Civil Service (Medical): No    Lack of Transportation (Non-Medical): No  Physical Activity: Not on file  Stress: Not on file  Social Connections: Not on file   Additional Social History:                         Sleep: Good  Appetite:  Good  Current Medications: Current Facility-Administered Medications  Medication Dose Route Frequency Provider Last Rate Last Admin   acetaminophen (TYLENOL) tablet 650 mg  650 mg Oral Q6H PRN Sarina Ill, DO       alum & mag hydroxide-simeth (MAALOX/MYLANTA) 200-200-20 MG/5ML suspension 30 mL  30 mL Oral Q4H PRN Sarina Ill, DO       atorvastatin (LIPITOR) tablet 40 mg  40 mg Oral QHS Sarina Ill, DO   40 mg at 06/11/22 2112   carvedilol (COREG) tablet 25 mg  25 mg Oral BID WC Sarina Ill,  DO   25 mg at 06/12/22 0820   cloNIDine (CATAPRES) tablet 0.2 mg  0.2 mg Oral Q6H PRN Sarina Ill, DO       furosemide (LASIX) tablet 40 mg  40 mg Oral Daily Sarina Ill, DO   40 mg at 06/12/22 0820   haloperidol (HALDOL) tablet 5 mg  5 mg Oral Q6H PRN Sarina Ill, DO   5 mg at 06/07/22 0800   Or   haloperidol lactate (HALDOL) injection 5 mg  5 mg Intramuscular Q6H PRN Sarina Ill, DO       hydrALAZINE (APRESOLINE) tablet 50 mg  50 mg Oral BID Sarina Ill, DO   50 mg at 06/12/22 0820   isosorbide  mononitrate (IMDUR) 24 hr tablet 60 mg  60 mg Oral Daily Sarina Ill, DO   60 mg at 06/12/22 0820   LORazepam (ATIVAN) tablet 1 mg  1 mg Oral Q6H PRN Sarina Ill, DO   1 mg at 06/11/22 2121   Or   LORazepam (ATIVAN) injection 1 mg  1 mg Intramuscular Q6H PRN Sarina Ill, DO       magnesium hydroxide (MILK OF MAGNESIA) suspension 30 mL  30 mL Oral Daily PRN Sarina Ill, DO       OLANZapine zydis (ZYPREXA) disintegrating tablet 5 mg  5 mg Oral BID Sarina Ill, DO   5 mg at 06/12/22 0820   traZODone (DESYREL) tablet 100 mg  100 mg Oral QHS PRN Sarina Ill, DO   100 mg at 06/11/22 2121    Lab Results: No results found for this or any previous visit (from the past 48 hour(s)).  Blood Alcohol level:  Lab Results  Component Value Date   ETH <10 06/04/2022   ETH <10 10/07/2021    Metabolic Disorder Labs: Lab Results  Component Value Date   HGBA1C 5.8 (H) 06/10/2022   MPG 120 06/10/2022   MPG 102.54 05/13/2020   Lab Results  Component Value Date   PROLACTIN 27.3 (H) 12/18/2019   PROLACTIN 16.5 06/09/2016   Lab Results  Component Value Date   CHOL 147 03/27/2022   TRIG 89 03/27/2022   HDL 37 (L) 03/27/2022   CHOLHDL 4.0 03/27/2022   VLDL 18 03/27/2022   LDLCALC 92 03/27/2022   LDLCALC 101 (H) 02/15/2021    Physical Findings: AIMS:  , ,  ,  ,    CIWA:     COWS:     Musculoskeletal: Strength & Muscle Tone: within normal limits Gait & Station: normal Patient leans: N/A  Psychiatric Specialty Exam:  Presentation  General Appearance:  Fairly Groomed  Eye Contact: Fair  Speech: Pressured  Speech Volume: Normal  Handedness: Right   Mood and Affect  Mood: Irritable  Affect: Congruent   Thought Process  Thought Processes: Disorganized  Descriptions of Associations:Tangential  Orientation:Full (Time, Place and Person)  Thought Content:Tangential; Paranoid Ideation  History of Schizophrenia/Schizoaffective disorder:Yes  Duration of Psychotic Symptoms:Greater than six months  Hallucinations:No data recorded Ideas of Reference:Paranoia  Suicidal Thoughts:No data recorded Homicidal Thoughts:No data recorded  Sensorium  Memory: Immediate Fair; Recent Fair  Judgment: Impaired  Insight: Poor   Art therapist  Concentration: Fair  Attention Span: Fair  Recall: Fair  Fund of Knowledge: Fair  Language: Fair   Psychomotor Activity  Psychomotor Activity:No data recorded  Assets  Assets: Desire for Improvement; Leisure Time; Physical Health; Resilience; Social Support   Sleep  Sleep:No data recorded   Physical Exam: Physical Exam Vitals and nursing note reviewed.  Constitutional:      Appearance: Normal appearance. She is normal weight.  Neurological:     General: No focal deficit present.     Mental Status: She is alert and oriented to person, place, and time.  Psychiatric:        Attention and Perception: Attention and perception normal.        Mood and Affect: Mood and affect normal.        Speech: Speech normal.        Behavior: Behavior normal. Behavior is cooperative.        Thought Content: Thought content normal.        Cognition and Memory: Cognition  and memory normal.        Judgment: Judgment normal.    ROS Blood pressure (!) 179/82, pulse 61, temperature 97.9  F (36.6 C), resp. rate 17, height 5\' 7"  (1.702 m), weight 101.8 kg, last menstrual period 05/10/2013, SpO2 98 %. Body mass index is 35.16 kg/m.   Treatment Plan Summary: Daily contact with patient to assess and evaluate symptoms and progress in treatment, Medication management, and Plan continue current medications.  Sarina Illichard Edward Ayana Imhof, DO 06/12/2022, 9:42 AM

## 2022-06-12 NOTE — Progress Notes (Signed)
Patient denies SI, HI, and AVH. Patient is calm and cooperative with assessment. She is compliant with scheduled medications. Patient's blood pressure was 179/82 this morning, but came down to 129/54 after taking coreg. Patient remains safe on the unit at this time.

## 2022-06-12 NOTE — Group Note (Signed)
Date:  06/12/2022 Time:  1:14 AM  Group Topic/Focus:  Building Self Esteem:   The Focus of this group is helping patients become aware of the effects of self-esteem on their lives, the things they and others do that enhance or undermine their self-esteem, seeing the relationship between their level of self-esteem and the choices they make and learning ways to enhance self-esteem. Goals Group:   The focus of this group is to help patients establish daily goals to achieve during treatment and discuss how the patient can incorporate goal setting into their daily lives to aide in recovery. Making Healthy Choices:   The focus of this group is to help patients identify negative/unhealthy choices they were using prior to admission and identify positive/healthier coping strategies to replace them upon discharge. Personal Choices and Values:   The focus of this group is to help patients assess and explore the importance of values in their lives, how their values affect their decisions, how they express their values and what opposes their expression. Self Care:   The focus of this group is to help patients understand the importance of self-care in order to improve or restore emotional, physical, spiritual, interpersonal, and financial health. Self Esteem Action Plan:   The focus of this group is to help patients create a plan to continue to build self-esteem after discharge.    Participation Level:  Active  Participation Quality:  Attentive  Affect:  Appropriate  Cognitive:  Appropriate  Insight: Good  Engagement in Group:  Improving  Modes of Intervention:  Support  Additional Comments:    Erin Riley 06/12/2022, 1:14 AM

## 2022-06-12 NOTE — BH IP Treatment Plan (Signed)
Interdisciplinary Treatment and Diagnostic Plan Update  06/12/2022 Time of Session: 9:30AM RANI FAILLE MRN: [id number]  Principal Diagnosis: Schizophrenia, disorganized, chronic with acute exacerbation  Secondary Diagnoses: Principal Problem:   Schizophrenia, disorganized, chronic with acute exacerbation   Current Medications:  Current Facility-Administered Medications  Medication Dose Route Frequency Provider Last Rate Last Admin   acetaminophen (TYLENOL) tablet 650 mg  650 mg Oral Q6H PRN Sarina Ill, DO       alum & mag hydroxide-simeth (MAALOX/MYLANTA) 200-200-20 MG/5ML suspension 30 mL  30 mL Oral Q4H PRN Sarina Ill, DO       atorvastatin (LIPITOR) tablet 40 mg  40 mg Oral QHS Sarina Ill, DO   40 mg at 06/11/22 2112   carvedilol (COREG) tablet 25 mg  25 mg Oral BID WC Sarina Ill, DO   25 mg at 06/12/22 0820   cloNIDine (CATAPRES) tablet 0.2 mg  0.2 mg Oral Q6H PRN Sarina Ill, DO       furosemide (LASIX) tablet 40 mg  40 mg Oral Daily Sarina Ill, DO   40 mg at 06/12/22 0820   haloperidol (HALDOL) tablet 5 mg  5 mg Oral Q6H PRN Sarina Ill, DO   5 mg at 06/07/22 0800   Or   haloperidol lactate (HALDOL) injection 5 mg  5 mg Intramuscular Q6H PRN Sarina Ill, DO       hydrALAZINE (APRESOLINE) tablet 50 mg  50 mg Oral BID Sarina Ill, DO   50 mg at 06/12/22 0820   isosorbide mononitrate (IMDUR) 24 hr tablet 60 mg  60 mg Oral Daily Sarina Ill, DO   60 mg at 06/12/22 0820   LORazepam (ATIVAN) tablet 1 mg  1 mg Oral Q6H PRN Sarina Ill, DO   1 mg at 06/11/22 2121   Or   LORazepam (ATIVAN) injection 1 mg  1 mg Intramuscular Q6H PRN Sarina Ill, DO       magnesium hydroxide (MILK OF MAGNESIA) suspension 30 mL  30 mL Oral Daily PRN Sarina Ill, DO       OLANZapine zydis (ZYPREXA) disintegrating tablet 5 mg  5 mg Oral BID Sarina Ill, DO   5 mg at 06/12/22 0820   potassium chloride (KLOR-CON M) CR tablet 10 mEq  10 mEq Oral Daily Sarina Ill, DO       traZODone (DESYREL) tablet 100 mg  100 mg Oral QHS PRN Sarina Ill, DO   100 mg at 06/11/22 2121   PTA Medications: Medications Prior to Admission  Medication Sig Dispense Refill Last Dose   aspirin EC 81 MG EC tablet Take 1 tablet (81 mg total) by mouth daily. Swallow whole. (Patient not taking: Reported on 06/04/2022) 30 tablet 0    atorvastatin (LIPITOR) 40 MG tablet Take 1 tablet (40 mg total) by mouth daily. 30 tablet 1    carvedilol (COREG) 3.125 MG tablet Take 1 tablet (3.125 mg total) by mouth 2 (two) times daily with a meal. 60 tablet 1    ferrous sulfate 325 (65 FE) MG EC tablet Take 325 mg by mouth daily with breakfast. (Patient not taking: Reported on 06/04/2022)      furosemide (LASIX) 40 MG tablet Take 1 tablet (40 mg total) by mouth daily. (Patient not taking: Reported on 06/04/2022) 30 tablet 1    hydrALAZINE (APRESOLINE) 50 MG tablet Take 1 tablet (50 mg total) by mouth 2 (two) times daily.  60 tablet 1    isosorbide mononitrate (IMDUR) 60 MG 24 hr tablet Take 1 tablet (60 mg total) by mouth daily. 30 tablet 1    Multiple Vitamins-Minerals (MULTIVITAMIN WITH MINERALS) tablet Take 1 tablet by mouth daily. (Patient not taking: Reported on 06/04/2022)      OLANZapine zydis (ZYPREXA) 5 MG disintegrating tablet Dissolve 1 tablet (5 mg total) by mouth 2 (two) times daily. (Patient not taking: Reported on 06/04/2022) 60 tablet 1    triamcinolone cream (KENALOG) 0.1 % Apply 1 application topically 2 (two) times daily. Apply to rash on left thigh (Patient not taking: Reported on 06/04/2022) 30 g 0     Patient Stressors: Health problems   Medication change or noncompliance    Patient Strengths: Active sense of humor  Capable of independent living  Motivation for treatment/growth   Treatment Modalities: Medication Management, Group  therapy, Case management,  1 to 1 session with clinician, Psychoeducation, Recreational therapy.   Physician Treatment Plan for Primary Diagnosis: Schizophrenia, disorganized, chronic with acute exacerbation Long Term Goal(s): Improvement in symptoms so as ready for discharge   Short Term Goals: Ability to identify changes in lifestyle to reduce recurrence of condition will improve Ability to verbalize feelings will improve Ability to disclose and discuss suicidal ideas Ability to demonstrate self-control will improve Ability to identify and develop effective coping behaviors will improve Ability to maintain clinical measurements within normal limits will improve Compliance with prescribed medications will improve Ability to identify triggers associated with substance abuse/mental health issues will improve  Medication Management: Evaluate patient's response, side effects, and tolerance of medication regimen.  Therapeutic Interventions: 1 to 1 sessions, Unit Group sessions and Medication administration.  Evaluation of Outcomes: Progressing  Physician Treatment Plan for Secondary Diagnosis: Principal Problem:   Schizophrenia, disorganized, chronic with acute exacerbation  Long Term Goal(s): Improvement in symptoms so as ready for discharge   Short Term Goals: Ability to identify changes in lifestyle to reduce recurrence of condition will improve Ability to verbalize feelings will improve Ability to disclose and discuss suicidal ideas Ability to demonstrate self-control will improve Ability to identify and develop effective coping behaviors will improve Ability to maintain clinical measurements within normal limits will improve Compliance with prescribed medications will improve Ability to identify triggers associated with substance abuse/mental health issues will improve     Medication Management: Evaluate patient's response, side effects, and tolerance of medication  regimen.  Therapeutic Interventions: 1 to 1 sessions, Unit Group sessions and Medication administration.  Evaluation of Outcomes: Progressing   RN Treatment Plan for Primary Diagnosis: Schizophrenia, disorganized, chronic with acute exacerbation Long Term Goal(s): Knowledge of disease and therapeutic regimen to maintain health will improve  Short Term Goals: Ability to demonstrate self-control, Ability to participate in decision making will improve, Ability to verbalize feelings will improve, Ability to disclose and discuss suicidal ideas, Ability to identify and develop effective coping behaviors will improve, and Compliance with prescribed medications will improve  Medication Management: RN will administer medications as ordered by provider, will assess and evaluate patient's response and provide education to patient for prescribed medication. RN will report any adverse and/or side effects to prescribing provider.  Therapeutic Interventions: 1 on 1 counseling sessions, Psychoeducation, Medication administration, Evaluate responses to treatment, Monitor vital signs and CBGs as ordered, Perform/monitor CIWA, COWS, AIMS and Fall Risk screenings as ordered, Perform wound care treatments as ordered.  Evaluation of Outcomes: Progressing   LCSW Treatment Plan for Primary Diagnosis: Schizophrenia, disorganized, chronic with acute  exacerbation Long Term Goal(s): Safe transition to appropriate next level of care at discharge, Engage patient in therapeutic group addressing interpersonal concerns.  Short Term Goals: Engage patient in aftercare planning with referrals and resources, Increase social support, Increase ability to appropriately verbalize feelings, Increase emotional regulation, Facilitate acceptance of mental health diagnosis and concerns, and Increase skills for wellness and recovery  Therapeutic Interventions: Assess for all discharge needs, 1 to 1 time with Social worker, Explore  available resources and support systems, Assess for adequacy in community support network, Educate family and significant other(s) on suicide prevention, Complete Psychosocial Assessment, Interpersonal group therapy.  Evaluation of Outcomes: Progressing   Progress in Treatment: Attending groups: No. Participating in groups: No. Taking medication as prescribed: Yes. Toleration medication: Yes. Family/Significant other contact made: No, will contact:  once permission is given. Patient understands diagnosis: No. Discussing patient identified problems/goals with staff: Yes. Medical problems stabilized or resolved: Yes. Denies suicidal/homicidal ideation: Yes. Issues/concerns per patient self-inventory: No. Other: none  New problem(s) identified: No, Describe:  none Update 06/12/2022:  No changes at this time.    New Short Term/Long Term Goal(s):  elimination of symptoms of psychosis, medication management for mood stabilization; elimination of SI thoughts; development of comprehensive mental wellness plan. Update 06/12/2022:  No changes at this time.    Patient Goals:  "I was upset there was a lot of trauma that's all" Update 06/12/2022:  No changes at this time.    Discharge Plan or Barriers: CSW to assist with the development of appropriate discharge plans. Update 06/12/2022:  Patient reports plans to have a therapist and a psychiatrist at discharge.  Reports plans to return to her home.     Reason for Continuation of Hospitalization: Anxiety Depression Medication stabilization Suicidal ideation   Estimated Length of Stay:  1-7 days Update 06/12/2022:  TBD  Last 3 [place] Suicide Severity Risk Score: Flowsheet Row Admission (Current) from 06/06/2022 in [facility] BEHAV[facility] ED from 06/04/2022 in [facility] Emergency Department at [facility] ED to Hosp-Admission (Discharged) from 03/25/2022 in [facility] LONG 4TH FLOOR PROGRESSIVE CARE AND UROLOGY  C-SSRS RISK CATEGORY No Risk  No Risk No Risk       Last PHQ 2/9 Scores:    01/26/2021   10:37 AM 01/09/2018    4:02 PM 11/13/2017    9:11 AM  Depression screen PHQ 2/9  Decreased Interest 0 0 0  Down, Depressed, Hopeless 0 0 0  PHQ - 2 Score 0 0 0  Altered sleeping 0    Tired, decreased energy 0    Change in appetite 0    Feeling bad or failure about yourself  0    Trouble concentrating 0    Moving slowly or fidgety/restless 0    Suicidal thoughts 0    PHQ-9 Score 0    Difficult doing work/chores Not difficult at all      Scribe for Treatment Team: Harden Mo, LCSW 06/12/2022 9:48 AM

## 2022-06-12 NOTE — Group Note (Signed)
Recreation Therapy Group Note   Group Topic:Emotion Expression  Group Date: 06/12/2022 Start Time: 1400 End Time: 1455 Facilitators: Rosina Lowenstein, LRT, CTRS Location: Courtyard  Group Description: Gratitude Journaling. Patients and LRT discussed what gratitude means, how we can express it and what it means to [place], personally. LRT gave an education handout on the definition of gratitude that also gave different examples of gratitude exercises that they could try. One of the examples was "Gratitude Letter", which prompted you to write a letter to someone you appreciate. LRT played soft music while everyone wrote their letter. Once letter was completed, LRT encouraged people to read their letter, if they wanted to, or share who they wrote it to, at minimum. LRT and pts processed how showing gratitude towards themselves, and others can be applied to everyday life post-discharge.   Affect/Mood: N/A   Participation Level: Did not attend    Clinical Observations/Individualized Feedback: Erin Riley did not attend group due to resting in her room.  Plan: Continue to engage patient in RT group sessions 2-3x/week.   Rosina Lowenstein, LRT, CTRS 06/12/2022 2:59 PM

## 2022-06-13 DIAGNOSIS — F201 Disorganized schizophrenia: Secondary | ICD-10-CM | POA: Diagnosis not present

## 2022-06-13 MED ORDER — OLANZAPINE 5 MG PO TBDP
10.0000 mg | ORAL_TABLET | Freq: Every day | ORAL | Status: DC
  Administered 2022-06-13: 10 mg via ORAL
  Filled 2022-06-13: qty 2

## 2022-06-13 NOTE — Group Note (Unsigned)
Date:  06/13/2022 Time:  6:51 PM  Group Topic/Focus:  Bingo and Card games     Participation Level:  {BHH PARTICIPATION QPRFF:63846}  Participation Quality:  {BHH PARTICIPATION QUALITY:22265}  Affect:  {BHH AFFECT:22266}  Cognitive:  {BHH COGNITIVE:22267}  Insight: {BHH Insight2:20797}  Engagement in Group:  {BHH ENGAGEMENT IN KZLDJ:57017}  Modes of Intervention:  {BHH MODES OF INTERVENTION:22269}  Additional Comments:  ***  Boisvert, Amy T 06/13/2022, 6:51 PM

## 2022-06-13 NOTE — BHH Counselor (Signed)
CSW spoke with the patient's son.  He reports that the patient lives at [facility] in [place].  He reports that patietn acn return.  CSW asked for son to call and confirm and son adamantly refused to call, requesting that CSW "just drop her off".   He reports that he has not been informed that patient can not be retunred so he thinks that patient can return.    He agreed to have CSW schedule his mother for aftercare.  CSW reviewed Medicare IM with patient's son.  He indicates that he is his mothers payee.  Requests that medications be sent to [facility] [address] in [place].  Penni Homans, MSW, LCSW 06/13/2022 10:43 AM

## 2022-06-13 NOTE — Group Note (Signed)
Recreation Therapy Group Note   Group Topic:Relaxation  Group Date: 06/13/2022 Start Time: 1400 End Time: 1440 Facilitators: Rosina Lowenstein, LRT, CTRS Location:  Craft Room  Group Description: Mindfulness Body Scan. LRT educated on the benefits of mindfulness and how it can apply to everyday life post-discharge. LRT and pt's followed along to an audio script of a "mindfulness body scan" video. LRT and pts discussed feelings after following along with prompt.  Affect/Mood: N/A   Participation Level: Did not attend    Clinical Observations/Individualized Feedback: Erin Riley did not attend group despite encouragement.   Plan: Continue to engage patient in RT group sessions 2-3x/week.   Rosina Lowenstein, LRT, CTRS 06/13/2022 3:13 PM

## 2022-06-13 NOTE — Progress Notes (Signed)
Patient woke up in the middle of the night and tried to use the phone. Was reminded about phone use policy. Patient returned to bed and currently sleeping. No sign of distress noted.

## 2022-06-13 NOTE — Group Note (Signed)
Date:  06/13/2022 Time:  6:58 PM  Group Topic/Focus:  Self Care:   The focus of this group is to help patients understand the importance of self-care in order to improve or restore emotional, physical, spiritual, interpersonal, and financial health.    Participation Level:  Active  Participation Quality:  Appropriate  Affect:  Appropriate  Cognitive:  Appropriate  Insight: Improving  Engagement in Group:  Improving  Modes of Intervention:  Activity and Socialization  Additional Comments:    Luane School 06/13/2022, 6:58 PM

## 2022-06-13 NOTE — Progress Notes (Signed)
Patient came to breakfast this morning.  Refused VS prior to eating. VS obtained by Ladean Raya, RN after patient finished eating. Patient reports she slept well and had a good appetite.  Denies pain. Denies SI/HI and AVH. Compliant with scheduled medications. 15 min checks in place for safety. Patient did not attend any groups. Isolates to room with the exception of meals.

## 2022-06-13 NOTE — Group Note (Signed)
LCSW Group Therapy Note  Group Date: 06/13/2022 Start Time: 1300 End Time: 1400   Type of Therapy and Topic:  Group Therapy - How To Cope with Nervousness about Discharge   Participation Level:  Did Not Attend   Description of Group This process group involved identification of patients' feelings about discharge. Some of them are scheduled to be discharged soon, while others are new admissions, but each of them was asked to share thoughts and feelings surrounding discharge from the hospital. One common theme was that they are excited at the prospect of going home, while another was that many of them are apprehensive about sharing why they were hospitalized. Patients were given the opportunity to discuss these feelings with their peers in preparation for discharge.  Therapeutic Goals  Patient will identify their overall feelings about pending discharge. Patient will think about how they might proactively address issues that they believe will once again arise once they get home (i.e. with parents). Patients will participate in discussion about having hope for change.   Summary of Patient Progress:  Patient declined to attend group despite encouragement from this clinician.   Therapeutic Modalities Cognitive Behavioral Therapy   Harden Mo, Theresia Majors 06/13/2022  2:07 PM

## 2022-06-13 NOTE — Progress Notes (Signed)
[facility] MD Progress Note  06/13/2022 11:25 AM Erin Riley  MRN:  [id number] Subjective: Erin Riley is seen on rounds.  Nurses report that she can be very difficult at times but she has been very pleasant with me.  She has been compliant with her medications and when I changed her Zyprexa from twice a day to once at bedtime.  She says that she spoke to her son who said that she can come home as long as she is taking her medications.  Social work is still trying to get a hold of him.  I think she can leave tomorrow.  No side effects from medications. Principal Problem: Schizophrenia, disorganized, chronic with acute exacerbation Diagnosis: Principal Problem:   Schizophrenia, disorganized, chronic with acute exacerbation  Total Time spent with patient: 15 minutes  Past Psychiatric History: Long history of schizophrenia and noncompliance. She also has a history of hypertension, chronic kidney disease, aortic stenosis and cardiomyopathy.   Past Medical History:  Past Medical History:  Diagnosis Date   Chronic back pain    Chronic kidney disease (CKD), stage II (mild)    Class I-II   Coronary artery disease 04/2009   50% stenosis in the perforator of LAD; catheterization was for an abnormal Myoview in January 2000 showing anterior and inferolateral ischemia.   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 > 3 subsequent Echos show improved/normal EF with moderate to severe concentric LVH and diastolic dysfunction with LVOT/intracavitary gradient --> 06/2016: Severe LVH.  Vigorous EF, 65-70%.?? Gr 1 DD. Mild AS.   Hyperlipidemia    Hypertension    Hypertensive hypertrophic cardiomyopathy: NYHA class II:  Echo: Severe concentric LVH with LV OT gradient; essentially preserved EF with diastolic dysfunction 02/15/2013   Echo 06/2016: Severe Concentric LVH. Vigorous EF 65-70%. ~ Gr I DD.    Mild aortic  stenosis by prior echocardiography    Echo 06/2016: Mild AS (Mean Gradient 15 mmHg); has had prior Mod-Severe MR (not seen on current echo)   PAD (peripheral artery disease) March 2013   Lower extremity Dopplers: R. SFA 50-60%, R. PTA proximally occluded with distal reconstitution;; L. common iliac ~50%, L. SFA 50-70% stenosis, L. PTA < 50%   Schizophrenia     Past Surgical History:  Procedure Laterality Date   BUNIONECTOMY     carotid doppler  05/29/2011   left bulb/prox ICA moderate amtfibrous plaque with no evidence significant reduction.,right bulb /proximal ICA normal patency   lower extremity doppler  05/29/2011   right SFA 50% to 59% diameter reduction,right posterior tibal atreery occlusive disease,reconstituting distally, left common illiac<50%,left SFA 50 to70%,left post. tibial <50%   NM MYOCAR PERF WALL MOTION  03/2009   Persantine; EF 51%-both anterior and inferolateral ischemia   TRANSTHORACIC ECHOCARDIOGRAM  06/2016   Severe LVH.  Vigorous EF of 65-70%.  No RWMA. ~Only grade 1 diastolic dysfunction.  Mild aortic stenosis (mean gradient 15 mmHg)   TRANSTHORACIC ECHOCARDIOGRAM  07/2012   EF 50-55%; severe concentric LVH; only grade 1 diastolic dysfunction. Mild aortic sclerosis - with LVOT /intracavitary gradient of roughly 20 mmHg mean. Mild to moderately dilated LA;; previously reported MR not seen   Family History:  Family History  Problem Relation Age of Onset   Hypertension Mother    Breast cancer Neg Hx    Family Psychiatric  History: Unremarkable Social History:  Social History   Substance and  Sexual Activity  Alcohol Use No     Social History   Substance and Sexual Activity  Drug Use No    Social History   Socioeconomic History   Marital status: Single    Spouse name: Not on file   Number of children: 7   Years of education: Not on file   Highest education level: Not on file  Occupational History   Not on file  Tobacco Use   Smoking status: Some  Days    Types: Cigarettes    Last attempt to quit: 05/14/2002    Years since quitting: 20.0   Smokeless tobacco: Never  Vaping Use   Vaping Use: Never used  Substance and Sexual Activity   Alcohol use: No   Drug use: No   Sexual activity: Yes    Birth control/protection: Post-menopausal, None  Other Topics Concern   Not on file  Social History Narrative   Now single mother of 2 with one grandchild. She quit smoking roughly 5 years ago and is not so since. She has also stopped drinking alcohol. She does try get routine exercise walking at least a mile 3-4 days a week.    She lives with her 71 year old mother. 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   Social Determinants of Health   Financial Resource Strain: Not on file  Food Insecurity: No Food Insecurity (06/06/2022)   Hunger Vital Sign    Worried About Running Out of Food in the Last Year: Never true    Ran Out of Food in the Last Year: Never true  Transportation Needs: No Transportation Needs (06/06/2022)   PRAPARE - Administrator, Civil ServiceTransportation    Lack of Transportation (Medical): No    Lack of Transportation (Non-Medical): No  Physical Activity: Not on file  Stress: Not on file  Social Connections: Not on file   Additional Social History:                         Sleep: Good  Appetite:  Good  Current Medications: Current Facility-Administered Medications  Medication Dose Route Frequency Provider Last Rate Last Admin   acetaminophen (TYLENOL) tablet 650 mg  650 mg Oral Q6H PRN Sarina IllHerrick, Marvelle Span Edward, DO       alum & mag hydroxide-simeth (MAALOX/MYLANTA) 200-200-20 MG/5ML suspension 30 mL  30 mL Oral Q4H PRN Sarina IllHerrick, Roselle Norton Edward, DO       atorvastatin (LIPITOR) tablet 40 mg  40 mg Oral QHS Sarina IllHerrick, Ashleyanne Hemmingway Edward, DO   40 mg at 06/12/22 2208   carvedilol (COREG) tablet 25 mg  25 mg Oral BID WC Sarina IllHerrick, Vernee Baines Edward, DO   25 mg at 06/13/22 0919   furosemide (LASIX) tablet 40 mg  40 mg Oral Daily Sarina IllHerrick, Ravan Schlemmer Edward, DO    40 mg at 06/13/22 0920   haloperidol (HALDOL) tablet 5 mg  5 mg Oral Q6H PRN Sarina IllHerrick, Donya Tomaro Edward, DO   5 mg at 06/07/22 0800   Or   haloperidol lactate (HALDOL) injection 5 mg  5 mg Intramuscular Q6H PRN Sarina IllHerrick, Tramaine Sauls Edward, DO       hydrALAZINE (APRESOLINE) tablet 50 mg  50 mg Oral BID Sarina IllHerrick, Nya Monds Edward, DO   50 mg at 06/13/22 0919   isosorbide mononitrate (IMDUR) 24 hr tablet 60 mg  60 mg Oral Daily Sarina IllHerrick, Katisha Shimizu Edward, DO   60 mg at 06/13/22 0919   LORazepam (ATIVAN) tablet 1 mg  1 mg Oral Q6H PRN Sarina IllHerrick, Gokul Waybright Edward, DO  1 mg at 06/11/22 2121   Or   LORazepam (ATIVAN) injection 1 mg  1 mg Intramuscular Q6H PRN Sarina Ill, DO       magnesium hydroxide (MILK OF MAGNESIA) suspension 30 mL  30 mL Oral Daily PRN Sarina Ill, DO       OLANZapine zydis (ZYPREXA) disintegrating tablet 5 mg  5 mg Oral BID Sarina Ill, DO   5 mg at 06/13/22 0919   potassium chloride (KLOR-CON M) CR tablet 10 mEq  10 mEq Oral Daily Sarina Ill, DO   10 mEq at 06/13/22 0920   traZODone (DESYREL) tablet 100 mg  100 mg Oral QHS PRN Sarina Ill, DO   100 mg at 06/12/22 2208    Lab Results: No results found for this or any previous visit (from the past 48 hour(s)).  Blood Alcohol level:  Lab Results  Component Value Date   ETH <10 06/04/2022   ETH <10 10/07/2021    Metabolic Disorder Labs: Lab Results  Component Value Date   HGBA1C 5.8 (H) 06/10/2022   MPG 120 06/10/2022   MPG 102.54 05/13/2020   Lab Results  Component Value Date   PROLACTIN 27.3 (H) 12/18/2019   PROLACTIN 16.5 06/09/2016   Lab Results  Component Value Date   CHOL 147 03/27/2022   TRIG 89 03/27/2022   HDL 37 (L) 03/27/2022   CHOLHDL 4.0 03/27/2022   VLDL 18 03/27/2022   LDLCALC 92 03/27/2022   LDLCALC 101 (H) 02/15/2021    Physical Findings: AIMS:  , ,  ,  ,    CIWA:    COWS:     Musculoskeletal: Strength & Muscle Tone: within normal  limits Gait & Station: normal Patient leans: N/A  Psychiatric Specialty Exam:  Presentation  General Appearance:  Fairly Groomed  Eye Contact: Fair  Speech: Pressured  Speech Volume: Normal  Handedness: Right   Mood and Affect  Mood: Irritable  Affect: Congruent   Thought Process  Thought Processes: Disorganized  Descriptions of Associations:Tangential  Orientation:Full (Time, Place and Person)  Thought Content:Tangential; Paranoid Ideation  History of Schizophrenia/Schizoaffective disorder:Yes  Duration of Psychotic Symptoms:Greater than six months  Hallucinations:No data recorded Ideas of Reference:Paranoia  Suicidal Thoughts:No data recorded Homicidal Thoughts:No data recorded  Sensorium  Memory: Immediate Fair; Recent Fair  Judgment: Impaired  Insight: Poor   Art therapist  Concentration: Fair  Attention Span: Fair  Recall: Fair  Fund of Knowledge: Fair  Language: Fair   Psychomotor Activity  Psychomotor Activity:No data recorded  Assets  Assets: Desire for Improvement; Leisure Time; Physical Health; Resilience; Social Support   Sleep  Sleep:No data recorded   Physical Exam: Physical Exam Vitals and nursing note reviewed.  Constitutional:      Appearance: Normal appearance. She is normal weight.  Neurological:     General: No focal deficit present.     Mental Status: She is alert and oriented to person, place, and time.  Psychiatric:        Attention and Perception: Attention and perception normal.        Mood and Affect: Mood normal. Affect is labile.        Speech: Speech normal.        Behavior: Behavior normal. Behavior is cooperative.        Thought Content: Thought content normal.        Cognition and Memory: Cognition and memory normal.        Judgment: Judgment normal.  Review of Systems  Constitutional: Negative.   HENT: Negative.    Eyes: Negative.   Respiratory: Negative.     Cardiovascular: Negative.   Gastrointestinal: Negative.   Genitourinary: Negative.   Musculoskeletal: Negative.   Skin: Negative.   Neurological: Negative.   Endo/Heme/Allergies: Negative.   Psychiatric/Behavioral: Negative.     Blood pressure (!) 157/71, pulse 65, temperature 98.1 F (36.7 C), temperature source Oral, resp. rate 16, height 5\' 7"  (1.702 m), weight 101.8 kg, last menstrual period 05/10/2013, SpO2 97 %. Body mass index is 35.16 kg/m.   Treatment Plan Summary: Daily contact with patient to assess and evaluate symptoms and progress in treatment, Medication management, and Plan change Zyprexa to at bedtime only.  Fayth Trefry Tresea Mall, DO 06/13/2022, 11:25 AM

## 2022-06-13 NOTE — Group Note (Unsigned)
Date:  06/13/2022 Time:  6:51 PM  Group Topic/Focus:  Self Care:   The focus of this group is to help patients understand the importance of self-care in order to improve or restore emotional, physical, spiritual, interpersonal, and financial health.     Participation Level:  {BHH PARTICIPATION LEVEL:22264}  Participation Quality:  {BHH PARTICIPATION QUALITY:22265}  Affect:  {BHH AFFECT:22266}  Cognitive:  {BHH COGNITIVE:22267}  Insight: {BHH Insight2:20797}  Engagement in Group:  {BHH ENGAGEMENT IN GROUP:22268}  Modes of Intervention:  {BHH MODES OF INTERVENTION:22269}  Additional Comments:  ***  Erin Riley Y Erin Riley 06/13/2022, 6:51 PM  

## 2022-06-13 NOTE — Group Note (Signed)
Date:  06/13/2022 Time:  6:56 PM  Group Topic/Focus:  Bingo and Card games    Participation Level:  Active  Participation Quality:  Appropriate  Affect:  Appropriate  Cognitive:  Alert  Insight: Appropriate  Engagement in Group:  Engaged  Modes of Intervention:  Activity and Socialization  Additional Comments:  Patient played Bingo and other card games.  Boisvert, Amy T 06/13/2022, 6:56 PM

## 2022-06-13 NOTE — Plan of Care (Signed)
  Problem: Nutrition: Goal: Adequate nutrition will be maintained Outcome: Progressing   Problem: Coping: Goal: Level of anxiety will decrease Outcome: Progressing   Problem: Skin Integrity: Goal: Risk for impaired skin integrity will decrease Outcome: Progressing   Problem: Education: Goal: Will be free of psychotic symptoms Outcome: Not Progressing

## 2022-06-14 DIAGNOSIS — F201 Disorganized schizophrenia: Secondary | ICD-10-CM | POA: Diagnosis not present

## 2022-06-14 MED ORDER — ISOSORBIDE MONONITRATE ER 60 MG PO TB24: 60.0000 mg | ORAL_TABLET | Freq: Every day | ORAL | 3 refills | Status: DC

## 2022-06-14 MED ORDER — FUROSEMIDE 40 MG PO TABS
40.0000 mg | ORAL_TABLET | Freq: Every day | ORAL | 1 refills | Status: DC
Start: 2022-06-14 — End: 2023-05-21

## 2022-06-14 MED ORDER — HYDRALAZINE HCL 50 MG PO TABS: 50.0000 mg | ORAL_TABLET | Freq: Two times a day (BID) | ORAL | 1 refills | Status: DC

## 2022-06-14 MED ORDER — ATORVASTATIN CALCIUM 40 MG PO TABS: 40.0000 mg | ORAL_TABLET | Freq: Every day | ORAL | 3 refills | Status: DC

## 2022-06-14 MED ORDER — OLANZAPINE 5 MG PO TABS: 10.0000 mg | ORAL_TABLET | Freq: Every day | ORAL | 2 refills | Status: DC

## 2022-06-14 MED ORDER — CARVEDILOL 25 MG PO TABS: 25.0000 mg | ORAL_TABLET | Freq: Two times a day (BID) | ORAL | 3 refills | Status: DC

## 2022-06-14 MED ORDER — POTASSIUM CHLORIDE CRYS ER 10 MEQ PO TBCR: 10.0000 meq | EXTENDED_RELEASE_TABLET | Freq: Every day | ORAL | 3 refills | Status: DC

## 2022-06-14 NOTE — Progress Notes (Signed)
   06/14/22 0800  Psych Admission Type (Psych Patients Only)  Admission Status Involuntary  Psychosocial Assessment  Patient Complaints Irritability  Eye Contact Watchful  Facial Expression Animated  Affect Blunted;Labile  Speech Logical/coherent;Loud  Interaction Demanding  Motor Activity Slow  Appearance/Hygiene In scrubs  Behavior Characteristics Intrusive  Mood Labile  Thought Process  Coherency WDL  Content WDL  Delusions WDL  Perception WDL  Hallucination None reported or observed  Judgment Impaired  Confusion Mild  Danger to Self  Current suicidal ideation? Denies  Danger to Others  Danger to Others None reported or observed

## 2022-06-14 NOTE — Care Management Important Message (Signed)
Important Message  Patient Details  Name: NEHEMIAH ZADRA MRN: [id number] Date of Birth: 17-Jul-1950   Medicare Important Message Given:  Yes  CSW notes that patient declined to sign initial IM.  CSW attempted to have patient sign at discharge and continues to decline to sign.    Harden Mo, LCSW 06/14/2022, 11:13 AM

## 2022-06-14 NOTE — Plan of Care (Signed)
  Problem: Education: Goal: Knowledge of General Education information will improve Description: Including pain rating scale, medication(s)/side effects and non-pharmacologic comfort measures Outcome: Adequate for Discharge   Problem: Health Behavior/Discharge Planning: Goal: Ability to manage health-related needs will improve Outcome: Adequate for Discharge   Problem: Clinical Measurements: Goal: Ability to maintain clinical measurements within normal limits will improve Outcome: Adequate for Discharge Goal: Will remain free from infection Outcome: Adequate for Discharge Goal: Diagnostic test results will improve Outcome: Adequate for Discharge Goal: Respiratory complications will improve Outcome: Adequate for Discharge Goal: Cardiovascular complication will be avoided Outcome: Adequate for Discharge   Problem: Activity: Goal: Risk for activity intolerance will decrease Outcome: Adequate for Discharge   Problem: Nutritional: Goal: Ability to achieve adequate nutritional intake will improve Outcome: Adequate for Discharge   Problem: Health Behavior/Discharge Planning: Goal: Compliance with prescribed medication regimen will improve Outcome: Adequate for Discharge   Problem: Coping: Goal: Coping ability will improve Outcome: Adequate for Discharge Goal: Will verbalize feelings Outcome: Adequate for Discharge   Problem: Self-Concept: Goal: Will verbalize positive feelings about self Outcome: Adequate for Discharge

## 2022-06-14 NOTE — Group Note (Signed)
Recreation Therapy Group Note   Group Topic:Self-Esteem  Group Date: 06/14/2022 Start Time: 1400 End Time: 1450 Facilitators: Rosina Lowenstein, LRT, CTRS Location: Courtyard  Group Description: Positive Focus. Patients are given a handout that has 9 different boxes on it. Each box has a different prompt on it that requires you to identify and add something positive about themselves. LRT encourages pts to share at least two of their boxes to the group. LRT and pts discuss the importance of "thinking positive", self-esteem and how they can apply it to their everyday life post-discharge.  Affect/Mood: N/A   Participation Level: Did not attend    Clinical Observations/Individualized Feedback: Erin Riley did not attend group due to preparing for discharge.   Plan: Continue to engage patient in RT group sessions 2-3x/week.   Rosina Lowenstein, LRT, CTRS 06/14/2022 3:23 PM

## 2022-06-14 NOTE — Group Note (Signed)
LCSW Group Therapy Note  Group Date: 06/14/2022 Start Time: 1315 End Time: 1350   Type of Therapy and Topic:  Group Therapy: Anger Cues and Responses  Participation Level:  Did Not Attend   Description of Group:   In this group, patients learned how to recognize the physical, cognitive, emotional, and behavioral responses they have to anger-provoking situations.  They identified a recent time they became angry and how they reacted.  They analyzed how their reaction was possibly beneficial and how it was possibly unhelpful.  The group discussed a variety of healthier coping skills that could help with such a situation in the future.  Focus was placed on how helpful it is to recognize the underlying emotions to our anger, because working on those can lead to a more permanent solution as well as our ability to focus on the important rather than the urgent.  Therapeutic Goals: Patients will remember their last incident of anger and how they felt emotionally and physically, what their thoughts were at the time, and how they behaved. Patients will identify how their behavior at that time worked for them, as well as how it worked against them. Patients will explore possible new behaviors to use in future anger situations. Patients will learn that anger itself is normal and cannot be eliminated, and that healthier reactions can assist with resolving conflict rather than worsening situations.  Summary of Patient Progress:   Patient stated "uh uh" when asked by this CSW to attend group.   Therapeutic Modalities:   Cognitive Behavioral Therapy    Harden Mo, LCSWA 06/14/2022  1:53 PM

## 2022-06-14 NOTE — Progress Notes (Signed)
   06/14/22 0800  Psych Admission Type (Psych Patients Only)  Admission Status Involuntary  Psychosocial Assessment  Patient Complaints Irritability  Eye Contact Watchful  Facial Expression Animated  Affect Blunted;Labile  Speech Logical/coherent;Loud  Interaction Demanding  Motor Activity Slow  Appearance/Hygiene In scrubs  Behavior Characteristics Intrusive  Mood Labile  Thought Process  Coherency WDL  Content WDL  Delusions WDL  Perception WDL  Hallucination None reported or observed  Judgment Impaired  Confusion Mild  Danger to Self  Current suicidal ideation? Denies  Danger to Others  Danger to Others None reported or observed    

## 2022-06-14 NOTE — Progress Notes (Signed)
Patient is alert & oriented.  She ambulated from her room to the dayroom without difficulty.  She voiced no complaints or concerns.  She was agitated @ one point while in the dayroom, but was easily redirected.  No s/sx of acute distress.  She did not attend Group Therapy.  She excepted all of her medications without difficulty.  She watched tv in the dayroom to approximately 11p.  She rested in bed with closed eyes.  Her respirations were even & unlabored.  Plan of care is ongoing.

## 2022-06-14 NOTE — Progress Notes (Signed)
Patient alert and oriented x 3. Affect is labile. Denies anxiety, SI/HI or AVH. Patient states they will try to keep themselves safe when they return home.  Reviewed discharge instructions with patient including follow up appointment with provider, medication and prescriptions.  Questions answered and understanding verbalized.  Discharge packet given. All belongings returned to patient after verification completed by staff.    Patient escorted by staff off unit at this time stable without complaint.

## 2022-06-14 NOTE — Group Note (Signed)
Date:  06/14/2022 Time:  11:23 AM  Group Topic/Focus:  Making Healthy Choices:   The focus of this group is to help patients identify negative/unhealthy choices they were using prior to admission and identify positive/healthier coping strategies to replace them upon discharge. Overcoming Stress:   The focus of this group is to define stress and help patients assess their triggers.    Participation Level:  Active  Participation Quality:  Appropriate  Affect:  Appropriate  Cognitive:  Appropriate  Insight: None  Engagement in Group:  Engaged  Modes of Intervention:  Exploration and Socialization  Additional Comments:    Christoph Copelan l Jamyron Redd 06/14/2022, 11:23 AM

## 2022-06-14 NOTE — Progress Notes (Signed)
  [facility] Management Discharge Plan :  Will you be returning to the same living situation after discharge:  Yes,  pt reporting to her home.  At discharge, do you have transportation home?: Yes,  CSW to assist with transportation needs. Do you have the ability to pay for your medications: Yes,  [employer] MEDICARE / Suan Halter DUAL COMPLETE  Release of information consent forms completed and in the chart;  Patient's signature needed at discharge.  Patient to Follow up at:  Follow-up Information     [facility] Follow up.   Why: Appointment is scheduled for 06/21/2022 at 10:30AM. Contact information: [address]  [address] [place] [address] [phone]                 Next level of care provider has access to [facility] Link:no  Safety Planning and Suicide Prevention discussed: Yes,  SPE completed with the patient's son.     Has patient been referred to the Quitline?: Patient refused referral  Patient has been referred for addiction treatment: Pt. refused referral  Harden Mo, LCSW 06/14/2022, 11:11 AM

## 2022-06-14 NOTE — BHH Suicide Risk Assessment (Signed)
[facility] Discharge Suicide Risk Assessment   Principal Problem: Schizophrenia, disorganized, chronic with acute exacerbation Discharge Diagnoses: Principal Problem:   Schizophrenia, disorganized, chronic with acute exacerbation   Total Time spent with patient: 15 minutes  Musculoskeletal: Strength & Muscle Tone: within normal limits Gait & Station: normal Patient leans: N/A  Psychiatric Specialty Exam  Presentation  General Appearance:  Fairly Groomed  Eye Contact: Fair  Speech: Pressured  Speech Volume: Normal  Handedness: Right   Mood and Affect  Mood: Irritable  Duration of Depression Symptoms: No data recorded Affect: Congruent   Thought Process  Thought Processes: Disorganized  Descriptions of Associations:Tangential  Orientation:Full (Time, Place and Person)  Thought Content:Tangential; Paranoid Ideation  History of Schizophrenia/Schizoaffective disorder:Yes  Duration of Psychotic Symptoms:Greater than six months  Hallucinations:No data recorded Ideas of Reference:Paranoia  Suicidal Thoughts:No data recorded Homicidal Thoughts:No data recorded  Sensorium  Memory: Immediate Fair; Recent Fair  Judgment: Impaired  Insight: Poor   Art therapist  Concentration: Fair  Attention Span: Fair  Recall: Fair  Fund of Knowledge: Fair  Language: Fair   Psychomotor Activity  Psychomotor Activity:No data recorded  Assets  Assets: Desire for Improvement; Leisure Time; Physical Health; Resilience; Social Support   Sleep  Sleep:No data recorded   Blood pressure 108/62, pulse 65, temperature 98.6 F (37 C), temperature source Oral, resp. rate 18, height 5\' 7"  (1.702 m), weight 101.8 kg, last menstrual period 05/10/2013, SpO2 95 %. Body mass index is 35.16 kg/m.  Mental Status Per Nursing Assessment::   On Admission:  NA  Demographic Factors:  NA  Loss Factors: NA  Historical Factors: NA  Risk Reduction Factors:    Positive social support  Continued Clinical Symptoms:  Schizophrenia:   Paranoid or undifferentiated type  Cognitive Features That Contribute To Risk:  None    Suicide Risk:  Minimal: No identifiable suicidal ideation.  Patients presenting with no risk factors but with morbid ruminations; may be classified as minimal risk based on the severity of the depressive symptoms   Follow-up Information     [facility] Follow up.   Why: Appointment is scheduled for 06/21/2022 at 10:30AM. Contact information: [address]  [address] [place] [address] [phone]                 Plan Of Care/Follow-up recommendations: Vianne Bulls, DO 06/14/2022, 10:21 AM

## 2022-06-14 NOTE — BHH Counselor (Signed)
CSW met with patient to see if patient would sign consents. Patient declined.  CSW attempted to see if pt would sign Medicare IM notice.  Pt declined.    Pt did sign transportation waiver.   Penni Homans, MSW, LCSW 06/14/2022 11:53 AM

## 2022-06-14 NOTE — Discharge Summary (Signed)
Physician Discharge Summary Note  Patient:  Erin Riley is an 72 y.o., female MRN:  [id number] DOB:  01/31/1951 Patient phone:  [phone] (home)  Patient address:   [address] [place] [address],  Total Time spent with patient: 1 hour  Date of Admission:  06/06/2022 Date of Discharge: 06/14/2022  Reason for Admission:   Erin Riley is a 73 year old African-American female with long history of schizophrenia who presented to the emergency room with a chief complaint of neck pain.  She was delusional and not caring for herself.  She has not been compliant with her medications and she was involuntarily committed.  She was seen on psychiatric consultation and was not cooperative on evaluation.  She denied all signs and symptoms including SI and HI.  She has a history of stabilizing on Zyprexa.  She denied alcohol or drug use.  She told the nurse practitioner that there were snakes in her house and that she has snake bites. Patient has been admitted to psychiatric hospital several times previously.  In 2019 she was at [facility].  Eventually stabilized on Zyprexa although it sounds like even at her best her baseline continued to be somewhat disorganized.  Since then she has been to the emergency room several times usually for medical complaints and on at least a couple of those has been referred to psychiatric hospitals.  No known history of suicide attempts or violence.  Looks like Zyprexa has been used on several occasions with some benefit.  Patient not willing to discuss any outpatient treatment not clear whether she goes for any.   Principal Problem: Schizophrenia, disorganized, chronic with acute exacerbation Discharge Diagnoses: Principal Problem:   Schizophrenia, disorganized, chronic with acute exacerbation   Past Psychiatric History: History of schizophrenia, noncompliance and hospitalization.  Past Medical History:  Past Medical History:  Diagnosis Date    Chronic back pain    Chronic kidney disease (CKD), stage II (mild)    Class I-II   Coronary artery disease 04/2009   50% stenosis in the perforator of LAD; catheterization was for an abnormal Myoview in January 2000 showing anterior and inferolateral ischemia.   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 > 3 subsequent Echos show improved/normal EF with moderate to severe concentric LVH and diastolic dysfunction with LVOT/intracavitary gradient --> 06/2016: Severe LVH.  Vigorous EF, 65-70%.?? Gr 1 DD. Mild AS.   Hyperlipidemia    Hypertension    Hypertensive hypertrophic cardiomyopathy: NYHA class II:  Echo: Severe concentric LVH with LV OT gradient; essentially preserved EF with diastolic dysfunction 02/15/2013   Echo 06/2016: Severe Concentric LVH. Vigorous EF 65-70%. ~ Gr I DD.    Mild aortic stenosis by prior echocardiography    Echo 06/2016: Mild AS (Mean Gradient 15 mmHg); has had prior Mod-Severe MR (not seen on current echo)   PAD (peripheral artery disease) March 2013   Lower extremity Dopplers: R. SFA 50-60%, R. PTA proximally occluded with distal reconstitution;; L. common iliac ~50%, L. SFA 50-70% stenosis, L. PTA < 50%   Schizophrenia     Past Surgical History:  Procedure Laterality Date   BUNIONECTOMY     carotid doppler  05/29/2011   left bulb/prox ICA moderate amtfibrous plaque with no evidence significant reduction.,right bulb /proximal ICA normal patency   lower extremity doppler  05/29/2011   right SFA 50% to 59% diameter  reduction,right posterior tibal atreery occlusive disease,reconstituting distally, left common illiac<50%,left SFA 50 to70%,left post. tibial <50%   NM MYOCAR PERF WALL MOTION  03/2009   Persantine; EF 51%-both anterior and inferolateral ischemia   TRANSTHORACIC ECHOCARDIOGRAM  06/2016   Severe LVH.  Vigorous EF of 65-70%.  No RWMA. ~Only grade 1  diastolic dysfunction.  Mild aortic stenosis (mean gradient 15 mmHg)   TRANSTHORACIC ECHOCARDIOGRAM  07/2012   EF 50-55%; severe concentric LVH; only grade 1 diastolic dysfunction. Mild aortic sclerosis - with LVOT /intracavitary gradient of roughly 20 mmHg mean. Mild to moderately dilated LA;; previously reported MR not seen   Family History:  Family History  Problem Relation Age of Onset   Hypertension Mother    Breast cancer Neg Hx    Family Psychiatric  History: Unremarkable Social History:  Social History   Substance and Sexual Activity  Alcohol Use No     Social History   Substance and Sexual Activity  Drug Use No    Social History   Socioeconomic History   Marital status: Single    Spouse name: Not on file   Number of children: 7   Years of education: Not on file   Highest education level: Not on file  Occupational History   Not on file  Tobacco Use   Smoking status: Some Days    Types: Cigarettes    Last attempt to quit: 05/14/2002    Years since quitting: 20.0   Smokeless tobacco: Never  Vaping Use   Vaping Use: Never used  Substance and Sexual Activity   Alcohol use: No   Drug use: No   Sexual activity: Yes    Birth control/protection: Post-menopausal, None  Other Topics Concern   Not on file  Social History Narrative   Now single mother of 2 with one grandchild. She quit smoking roughly 5 years ago and is not so since. She has also stopped drinking alcohol. She does try get routine exercise walking at least a mile 3-4 days a week.    She lives with her 71 year old mother. 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   Social Determinants of Health   Financial Resource Strain: Not on file  Food Insecurity: No Food Insecurity (06/06/2022)   Hunger Vital Sign    Worried About Running Out of Food in the Last Year: Never true    Ran Out of Food in the Last Year: Never true  Transportation Needs: No Transportation Needs (06/06/2022)   PRAPARE - Doctor, general practiceTransportation    Lack  of Transportation (Medical): No    Lack of Transportation (Non-Medical): No  Physical Activity: Not on file  Stress: Not on file  Social Connections: Not on file    Hospital Course: May was involuntarily admitted to geriatric psychiatry for worsening behavior.  She was not caring for herself and her son called for her to be committed.  She had not been taking her medications.  While on the unit she was restarted on her Zyprexa.  Her blood pressure was high so we titrated her hypertensive medications and added some potassium, Lasix and increased her Coreg.  Her vital signs were stable on discharge.  She did well with the Zyprexa.  She denies any side effects.  Her mood and affect improved and she was interacting appropriately with peers and staff.  It is felt that she maximized hospitalization she was discharged home.  On the day of discharge she denied suicidal ideation, homicidal ideation, auditory or visual hallucinations.  Her judgment  and insight were good.  Physical Findings: AIMS:  , ,  ,  ,    CIWA:    COWS:     Musculoskeletal: Strength & Muscle Tone: within normal limits Gait & Station: normal Patient leans: N/A   Psychiatric Specialty Exam:  Presentation  General Appearance:  Fairly Groomed  Eye Contact: Fair  Speech: Pressured  Speech Volume: Normal  Handedness: Right   Mood and Affect  Mood: Irritable  Affect: Congruent   Thought Process  Thought Processes: Disorganized  Descriptions of Associations:Tangential  Orientation:Full (Time, Place and Person)  Thought Content:Tangential; Paranoid Ideation  History of Schizophrenia/Schizoaffective disorder:Yes  Duration of Psychotic Symptoms:Greater than six months  Hallucinations:No data recorded Ideas of Reference:Paranoia  Suicidal Thoughts:No data recorded Homicidal Thoughts:No data recorded  Sensorium  Memory: Immediate Fair; Recent  Fair  Judgment: Impaired  Insight: Poor   Art therapist  Concentration: Fair  Attention Span: Fair  Recall: Fair  Fund of Knowledge: Fair  Language: Fair   Psychomotor Activity  Psychomotor Activity:No data recorded  Assets  Assets: Desire for Improvement; Leisure Time; Physical Health; Resilience; Social Support   Sleep  Sleep:No data recorded   Physical Exam: Physical Exam Vitals and nursing note reviewed.  Constitutional:      Appearance: Normal appearance. She is normal weight.  Neurological:     General: No focal deficit present.     Mental Status: She is alert and oriented to person, place, and time.  Psychiatric:        Attention and Perception: Attention and perception normal.        Mood and Affect: Mood and affect normal.        Speech: Speech normal.        Behavior: Behavior normal. Behavior is cooperative.        Thought Content: Thought content normal.        Cognition and Memory: Cognition and memory normal.        Judgment: Judgment normal.    Review of Systems  Constitutional: Negative.   HENT: Negative.    Eyes: Negative.   Respiratory: Negative.    Cardiovascular: Negative.   Gastrointestinal: Negative.   Genitourinary: Negative.   Musculoskeletal: Negative.   Skin: Negative.   Neurological: Negative.   Endo/Heme/Allergies: Negative.   Psychiatric/Behavioral: Negative.     Blood pressure 108/62, pulse 65, temperature 98.6 F (37 C), temperature source Oral, resp. rate 18, height 5\' 7"  (1.702 m), weight 101.8 kg, last menstrual period 05/10/2013, SpO2 95 %. Body mass index is 35.16 kg/m.   Social History   Tobacco Use  Smoking Status Some Days   Types: Cigarettes   Last attempt to quit: 05/14/2002   Years since quitting: 20.0  Smokeless Tobacco Never   Tobacco Cessation:  A prescription for an FDA-approved tobacco cessation medication was offered at discharge and the patient refused   Blood Alcohol level:   Lab Results  Component Value Date   [facility] <10 06/04/2022   ETH <10 10/07/2021    Metabolic Disorder Labs:  Lab Results  Component Value Date   HGBA1C 5.8 (H) 06/10/2022   MPG 120 06/10/2022   MPG 102.54 05/13/2020   Lab Results  Component Value Date   PROLACTIN 27.3 (H) 12/18/2019   PROLACTIN 16.5 06/09/2016   Lab Results  Component Value Date   CHOL 147 03/27/2022   TRIG 89 03/27/2022   HDL 37 (L) 03/27/2022   CHOLHDL 4.0 03/27/2022   VLDL 18 03/27/2022   LDLCALC 92  03/27/2022   LDLCALC 101 (H) 02/15/2021    See Psychiatric Specialty Exam and Suicide Risk Assessment completed by Attending Physician prior to discharge.  Discharge destination:  Home  Is patient on multiple antipsychotic therapies at discharge:  No   Has Patient had three or more failed trials of antipsychotic monotherapy by history:  No  Recommended Plan for Multiple Antipsychotic Therapies: NA   Allergies as of 06/14/2022   No Known Allergies      Medication List     STOP taking these medications    OLANZapine zydis 5 MG disintegrating tablet Commonly known as: ZYPREXA Replaced by: OLANZapine 5 MG tablet       TAKE these medications      Indication  aspirin EC 81 MG tablet Take 1 tablet (81 mg total) by mouth daily. Swallow whole.    atorvastatin 40 MG tablet Commonly known as: LIPITOR Take 1 tablet (40 mg total) by mouth at bedtime. What changed: when to take this  Indication: High Amount of Fats in the Blood   carvedilol 25 MG tablet Commonly known as: COREG Take 1 tablet (25 mg total) by mouth 2 (two) times daily with a meal. What changed:  medication strength how much to take  Indication: Cardiac Failure, High Blood Pressure Disorder   ferrous sulfate 325 (65 FE) MG EC tablet Take 325 mg by mouth daily with breakfast.    furosemide 40 MG tablet Commonly known as: LASIX Take 1 tablet (40 mg total) by mouth daily.  Indication: Cardiac Failure, High Blood Pressure  Disorder   hydrALAZINE 50 MG tablet Commonly known as: APRESOLINE Take 1 tablet (50 mg total) by mouth 2 (two) times daily.  Indication: Cardiac Failure, High Blood Pressure Disorder   isosorbide mononitrate 60 MG 24 hr tablet Commonly known as: IMDUR Take 1 tablet (60 mg total) by mouth daily. Start taking on: June 15, 2022  Indication: Stable Angina Pectoris   multivitamin with minerals tablet Take 1 tablet by mouth daily.    OLANZapine 5 MG tablet Commonly known as: ZyPREXA Take 2 tablets (10 mg total) by mouth at bedtime. Replaces: OLANZapine zydis 5 MG disintegrating tablet  Indication: Schizophrenia   potassium chloride 10 MEQ tablet Commonly known as: KLOR-CON M Take 1 tablet (10 mEq total) by mouth daily. Start taking on: June 15, 2022  Indication: Low Amount of Potassium in the Blood   triamcinolone cream 0.1 % Commonly known as: KENALOG Apply 1 application topically 2 (two) times daily. Apply to rash on left thigh         Follow-up Information     [facility] Follow up.   Why: Appointment is scheduled for 06/21/2022 at 10:30AM. Contact information: [address]  [address] [place] [address] [phone]                 Follow-up recommendations:  [facility]    Signed: Sarina Ill, DO 06/14/2022, 10:32 AM

## 2022-06-20 ENCOUNTER — Other Ambulatory Visit (HOSPITAL_COMMUNITY): Payer: Self-pay

## 2022-07-19 ENCOUNTER — Emergency Department (HOSPITAL_COMMUNITY): Payer: 59

## 2022-07-19 ENCOUNTER — Other Ambulatory Visit: Payer: Self-pay

## 2022-07-19 ENCOUNTER — Emergency Department (HOSPITAL_COMMUNITY)
Admission: EM | Admit: 2022-07-19 | Discharge: 2022-07-20 | Disposition: A | Discharge status: Home / Self Care | Payer: 59 | Attending: Emergency Medicine | Admitting: Emergency Medicine

## 2022-07-19 ENCOUNTER — Encounter (HOSPITAL_COMMUNITY): Payer: Self-pay | Admitting: Emergency Medicine

## 2022-07-19 DIAGNOSIS — I716 Thoracoabdominal aortic aneurysm, without rupture, unspecified: Secondary | ICD-10-CM | POA: Insufficient documentation

## 2022-07-19 DIAGNOSIS — R911 Solitary pulmonary nodule: Secondary | ICD-10-CM | POA: Insufficient documentation

## 2022-07-19 DIAGNOSIS — K5790 Diverticulosis of intestine, part unspecified, without perforation or abscess without bleeding: Secondary | ICD-10-CM

## 2022-07-19 DIAGNOSIS — I771 Stricture of artery: Secondary | ICD-10-CM | POA: Diagnosis not present

## 2022-07-19 DIAGNOSIS — R109 Unspecified abdominal pain: Secondary | ICD-10-CM | POA: Diagnosis present

## 2022-07-19 LAB — COMPREHENSIVE METABOLIC PANEL
ALT: 19 U/L (ref 0–44)
AST: 27 U/L (ref 15–41)
Albumin: 3.7 g/dL (ref 3.5–5.0)
Alkaline Phosphatase: 83 U/L (ref 38–126)
Anion gap: 8 (ref 5–15)
BUN: 20 mg/dL (ref 8–23)
CO2: 26 mmol/L (ref 22–32)
Calcium: 10 mg/dL (ref 8.9–10.3)
Chloride: 105 mmol/L (ref 98–111)
Creatinine, Ser: 1.42 mg/dL — ABNORMAL HIGH (ref 0.44–1.00)
GFR, Estimated: 40 mL/min — ABNORMAL LOW (ref >60)
Glucose, Bld: 98 mg/dL (ref 70–99)
Potassium: 4.7 mmol/L (ref 3.5–5.1)
Sodium: 139 mmol/L (ref 135–145)
Total Bilirubin: 0.6 mg/dL (ref 0.3–1.2)
Total Protein: 7.9 g/dL (ref 6.5–8.1)

## 2022-07-19 LAB — URINALYSIS, ROUTINE W REFLEX MICROSCOPIC
Bacteria, UA: NONE SEEN
Bilirubin Urine: NEGATIVE
Glucose, UA: NEGATIVE mg/dL
Hgb urine dipstick: NEGATIVE
Ketones, ur: NEGATIVE mg/dL
Leukocytes,Ua: NEGATIVE
Nitrite: NEGATIVE
Protein, ur: >=300 mg/dL — AB
Specific Gravity, Urine: 1.017 (ref 1.005–1.030)
pH: 6 (ref 5.0–8.0)

## 2022-07-19 LAB — CBC
HCT: 44.2 % (ref 36.0–46.0)
Hemoglobin: 13.9 g/dL (ref 12.0–15.0)
MCH: 29.6 pg (ref 26.0–34.0)
MCHC: 31.4 g/dL (ref 30.0–36.0)
MCV: 94.2 fL (ref 80.0–100.0)
Platelets: 332 10*3/uL (ref 150–400)
RBC: 4.69 MIL/uL (ref 3.87–5.11)
RDW: 15.5 % (ref 11.5–15.5)
WBC: 4.9 10*3/uL (ref 4.0–10.5)
nRBC: 0 % (ref 0.0–0.2)

## 2022-07-19 LAB — LIPASE, BLOOD: Lipase: 40 U/L (ref 11–51)

## 2022-07-19 LAB — PREGNANCY, URINE: Preg Test, Ur: NEGATIVE

## 2022-07-19 MED ORDER — IOHEXOL 350 MG/ML SOLN
60.0000 mL | Freq: Once | INTRAVENOUS | Status: AC | PRN
  Administered 2022-07-19: 60 mL via INTRAVENOUS

## 2022-07-19 MED ORDER — IOHEXOL 350 MG/ML SOLN
80.0000 mL | Freq: Once | INTRAVENOUS | Status: AC | PRN
  Administered 2022-07-19: 80 mL via INTRAVENOUS

## 2022-07-19 NOTE — ED Notes (Signed)
Patient transported to CT 

## 2022-07-19 NOTE — ED Provider Notes (Incomplete)
[facility]ENC[facility] DEPT [facility] Emergency Department Provider Note MRN:  [id number]  Arrival date & time: 07/19/22     Chief Complaint   Abdominal Pain and Psychiatric Evaluation   History of Present Illness   Erin Riley is a 72 y.o. year-old female presents to the ED with chief complaint of abdominal pain and back pain.  States that she gave birth yesterday and that "they left the afterbirth in."  She clarifies that she means the placenta.  She states that she has had fevers and chills.  She has hx of schizophrenia.  Not a great historian.    History provided by patient. {RB interpreter (Optional):27221}  Review of Systems  Pertinent positive and negative review of systems noted in HPI.    Physical Exam   Vitals:   07/19/22 2215 07/19/22 2300  BP:  (!) 192/128  Pulse: 89 71  Resp: 18 (!) 25  Temp:    SpO2: 100% 100%    CONSTITUTIONAL:  non toxic-appearing, NAD NEURO:  Alert and oriented x 3, CN 3-12 grossly intact EYES:  eyes equal and reactive ENT/NECK:  Supple, no stridor  CARDIO:  normal rate, regular rhythm, appears well-perfused  PULM:  No respiratory distress, CTAB GI/GU:  non-distended, no focal tenderness MSK/SPINE:  No gross deformities, no edema, moves all extremities  SKIN:  no rash, atraumatic   *Additional and/or pertinent findings included in MDM below  Diagnostic and Interventional Summary    EKG Interpretation  Date/Time:    Ventricular Rate:    PR Interval:    QRS Duration:   QT Interval:    QTC Calculation:   R Axis:     Text Interpretation:         Labs Reviewed  COMPREHENSIVE METABOLIC PANEL - Abnormal; Notable for the following components:      Result Value   Creatinine, Ser 1.42 (*)    GFR, Estimated 40 (*)    All other components within normal limits  LIPASE, BLOOD  CBC  PREGNANCY, URINE  URINALYSIS, ROUTINE W REFLEX MICROSCOPIC    CT ABDOMEN PELVIS W CONTRAST    (Results Pending)  CT Angio Chest/Abd/Pel for  Dissection W and/or Wo Contrast    (Results Pending)    Medications  iohexol (OMNIPAQUE) 350 MG/ML injection 60 mL (60 mLs Intravenous Contrast Given 07/19/22 2232)     Procedures  /  Critical Care Procedures  ED Course and Medical Decision Making  I have reviewed the triage vital signs, the nursing notes, and pertinent available records from the EMR.  Social Determinants Affecting Complexity of Care: Patient has no clinically significant social determinants affecting this chief complaint.. {rbsocialsolutions:27068}  ED Course: Clinical Course as of 07/19/22 2310  Wed Jul 19, 2022  2309 Received phone call from radiology that patient has findings concerning for an acute aortic syndrome.  Discussed repeating CT angio despite GFR of 40.  Radiology and I agree that best option is to repeat the study with a full CT chest/ab/pel angio. [RB]    Clinical Course User Index [RB] Roxy Horseman, PA-C    Medical Decision Making Amount and/or Complexity of Data Reviewed Labs: ordered.     Consultants: No consultations were needed in caring for this patient.   Treatment and Plan: {rbadmissionvdc:27069}  {rbattending:27073}  Final Clinical Impressions(s) / ED Diagnoses  No diagnosis found.  ED Discharge Orders     None         Discharge Instructions Discussed with and Provided to Patient:  Discharge Instructions   None

## 2022-07-19 NOTE — ED Provider Triage Note (Signed)
Emergency Medicine Provider Triage Evaluation Note  Erin Riley , a 72 y.o. female  was evaluated in triage.  Pt complains of afterbirth x 2 days ago. States she is gravidus. No other complaints., No SI.HI or visual or auditory hallucinations.   Review of Systems  Positive: hallucinations Negative: Abdominal pain  Physical Exam  BP (!) 166/104 (BP Location: Right Arm)   Pulse 98   Temp 98.4 F (36.9 C) (Oral)   Resp 18   Ht 5\' 7"  (1.702 m)   Wt 102 kg   LMP 05/10/2013   SpO2 97%   BMI 35.22 kg/m  Gen:   Awake, no distress   Resp:  Normal effort  MSK:   Moves extremities without difficulty  Other:    Medical Decision Making  Medically screening exam initiated at 7:49 PM.  Appropriate orders placed.  Erin Riley was informed that the remainder of the evaluation will be completed by another provider, this initial triage assessment does not replace that evaluation, and the importance of remaining in the ED until their evaluation is complete.     Claude Manges, PA-C 07/19/22 1949

## 2022-07-19 NOTE — ED Triage Notes (Signed)
Patient BIB [facility] c/o "having afterbirth in her."  Patient reports she gave birth 2 days ago.  Patient endorses abd and lower back pain.

## 2022-07-19 NOTE — ED Provider Notes (Signed)
[facility] Emergency Department Provider Note MRN:  [id number]  Arrival date & time: 07/20/22     Chief Complaint   Abdominal Pain and Psychiatric Evaluation   History of Present Illness   Erin Riley is a 72 y.o. year-old female presents to the ED with chief complaint of abdominal pain and back pain.  States that she gave birth yesterday and that "they left the afterbirth in."  She clarifies that she means the placenta.  She states that she has had fevers and chills.  She has hx of schizophrenia.  Not a great historian.    History provided by patient.   Review of Systems  Pertinent positive and negative review of systems noted in HPI.    Physical Exam   Vitals:   07/20/22 0226 07/20/22 0230  BP: (!) 181/102 (!) 173/80  Pulse:  64  Resp:  17  Temp:    SpO2:  96%    CONSTITUTIONAL:  non toxic-appearing, NAD NEURO:  Alert and oriented x 3, CN 3-12 grossly intact EYES:  eyes equal and reactive ENT/NECK:  Supple, no stridor  CARDIO:  normal rate, regular rhythm, appears well-perfused  PULM:  No respiratory distress, CTAB GI/GU:  non-distended, no focal tenderness MSK/SPINE:  No gross deformities, no edema, moves all extremities  SKIN:  no rash, atraumatic   *Additional and/or pertinent findings included in MDM below  Diagnostic and Interventional Summary    EKG Interpretation  Date/Time:    Ventricular Rate:    PR Interval:    QRS Duration:   QT Interval:    QTC Calculation:   R Axis:     Text Interpretation:         Labs Reviewed  COMPREHENSIVE METABOLIC PANEL - Abnormal; Notable for the following components:      Result Value   Creatinine, Ser 1.42 (*)    GFR, Estimated 40 (*)    All other components within normal limits  URINALYSIS, ROUTINE W REFLEX MICROSCOPIC - Abnormal; Notable for the following components:   APPearance HAZY (*)    Protein, ur >=300 (*)    All other components within normal limits  LIPASE, BLOOD  CBC   PREGNANCY, URINE    CT Angio Chest/Abd/Pel for Dissection W and/or Wo Contrast  Final Result    CT ABDOMEN PELVIS W CONTRAST  Final Result      Medications  iohexol (OMNIPAQUE) 350 MG/ML injection 60 mL (60 mLs Intravenous Contrast Given 07/19/22 2232)  iohexol (OMNIPAQUE) 350 MG/ML injection 80 mL (80 mLs Intravenous Contrast Given 07/19/22 2324)  labetalol (NORMODYNE) injection 20 mg (20 mg Intravenous Given 07/20/22 0054)  hydrALAZINE (APRESOLINE) injection 10 mg (10 mg Intravenous Given 07/20/22 0226)     Procedures  /  Critical Care Procedures  ED Course and Medical Decision Making  I have reviewed the triage vital signs, the nursing notes, and pertinent available records from the EMR.  Social Determinants Affecting Complexity of Care: Patient has no clinically significant social determinants affecting this chief complaint..   ED Course: Clinical Course as of 07/20/22 0315  Wed Jul 19, 2022  2309 Received phone call from radiology that patient has findings concerning for an acute aortic syndrome.  Discussed repeating CT angio despite GFR of 40.  Radiology and I agree that best option is to repeat the study with a full CT chest/ab/pel angio. [RB]    Clinical Course User Index [RB] Roxy Horseman, PA-C    Medical Decision Making CT shows thoracoabdominal  aneurysm.  Will need close outpatient follow-up.  I've messaged patient's PCP and the on-call vascular surgeon to request follow-up appointments.  I tried notifying the patient's son, but he didn't answer.  Patient doesn't appear to be in any distress.  I don't think she needs any further emergent workup or hospitalization.    Amount and/or Complexity of Data Reviewed Labs: ordered.    Details: No leukocytosis or anemia Cr 1.42, about baseline for patient Radiology: ordered.  Risk Prescription drug management.     Consultants: No consultations were needed in caring for this patient.   Treatment and Plan: I  considered admission due to patient's initial presentation, but after considering the examination and diagnostic results, patient will not require admission and can be discharged with outpatient follow-up.    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 Diverticulosis  K57.90     3. 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  You need to follow-up with a vascular specialist.  Please discuss this with your doctor.  If you have worsening or changing symptoms, please return to the ER.  You need to discuss the following incidental findings with your doctor.  These are not attributed to your complaint today and don't have any emergent implications at this time.  Lung nodule Colonic diverticulosis Celiac artery stenosis       Roxy Horseman, PA-C 07/20/22 0315    Benjiman Core, MD 07/21/22 2348

## 2022-07-20 DIAGNOSIS — I716 Thoracoabdominal aortic aneurysm, without rupture, unspecified: Secondary | ICD-10-CM | POA: Diagnosis not present

## 2022-07-20 MED ORDER — LABETALOL HCL 5 MG/ML IV SOLN
20.0000 mg | Freq: Once | INTRAVENOUS | Status: AC
  Administered 2022-07-20: 20 mg via INTRAVENOUS
  Filled 2022-07-20: qty 4

## 2022-07-20 MED ORDER — HYDRALAZINE HCL 20 MG/ML IJ SOLN
10.0000 mg | INTRAMUSCULAR | Status: AC
  Administered 2022-07-20: 10 mg via INTRAVENOUS
  Filled 2022-07-20: qty 1

## 2022-07-20 NOTE — Discharge Instructions (Signed)
You have a large aneurysm of your aorta.  You need to follow-up with a vascular specialist.  Please discuss this with your doctor.  If you have worsening or changing symptoms, please return to the ER.  You need to discuss the following incidental findings with your doctor.  These are not attributed to your complaint today and don't have any emergent implications at this time.  Lung nodule Colonic diverticulosis Celiac artery stenosis

## 2022-07-21 ENCOUNTER — Other Ambulatory Visit: Payer: Self-pay | Admitting: Family Medicine

## 2022-07-21 DIAGNOSIS — I7123 Aneurysm of the descending thoracic aorta, without rupture: Secondary | ICD-10-CM

## 2022-07-21 NOTE — Progress Notes (Signed)
Please inform her that I referred her to cardiothoracic surgeon due to presence of aneurysm identified during her ED visit.  The office will contact her to schedule an appointment.

## 2022-07-21 NOTE — Progress Notes (Signed)
VM left informing patient to return phone call. 

## 2022-07-25 ENCOUNTER — Telehealth: Payer: Self-pay | Admitting: Surgery

## 2022-07-25 NOTE — Telephone Encounter (Signed)
-----   Message from Nada Libman, MD sent at 07/20/2022  9:45 AM EDT ----- Patient had a CT scan with 5.5 cm TAAA, needs to see a MD in the near future (2-3 weeks).  Has never seen a vascular Surgeon

## 2022-07-28 NOTE — Telephone Encounter (Signed)
Spoke with patient son. He state he will have her gives [place] a call today or tomorrow. Informed that we are closed on the weekends and for the Holiday on Monday. Advised that if we don't receive a call today to call back on Tuesday.

## 2022-08-03 ENCOUNTER — Other Ambulatory Visit: Payer: Self-pay | Admitting: Family Medicine

## 2022-08-03 DIAGNOSIS — I7123 Aneurysm of the descending thoracic aorta, without rupture: Secondary | ICD-10-CM

## 2022-09-09 ENCOUNTER — Inpatient Hospital Stay (HOSPITAL_COMMUNITY)
Admission: EM | Admit: 2022-09-09 | Discharge: 2022-09-14 | DRG: 305 | Disposition: A | Discharge status: Home Health | Payer: 59 | Attending: Internal Medicine | Admitting: Internal Medicine

## 2022-09-09 ENCOUNTER — Other Ambulatory Visit: Payer: Self-pay

## 2022-09-09 ENCOUNTER — Encounter (HOSPITAL_COMMUNITY): Payer: Self-pay

## 2022-09-09 ENCOUNTER — Emergency Department (HOSPITAL_COMMUNITY): Payer: 59

## 2022-09-09 DIAGNOSIS — I169 Hypertensive crisis, unspecified: Principal | ICD-10-CM | POA: Diagnosis present

## 2022-09-09 DIAGNOSIS — I2510 Atherosclerotic heart disease of native coronary artery without angina pectoris: Secondary | ICD-10-CM | POA: Diagnosis present

## 2022-09-09 DIAGNOSIS — R001 Bradycardia, unspecified: Secondary | ICD-10-CM | POA: Diagnosis present

## 2022-09-09 DIAGNOSIS — Z7982 Long term (current) use of aspirin: Secondary | ICD-10-CM | POA: Diagnosis not present

## 2022-09-09 DIAGNOSIS — N1832 Chronic kidney disease, stage 3b: Secondary | ICD-10-CM | POA: Diagnosis present

## 2022-09-09 DIAGNOSIS — Z91148 Patient's other noncompliance with medication regimen for other reason: Secondary | ICD-10-CM | POA: Diagnosis not present

## 2022-09-09 DIAGNOSIS — Z87891 Personal history of nicotine dependence: Secondary | ICD-10-CM | POA: Diagnosis not present

## 2022-09-09 DIAGNOSIS — I422 Other hypertrophic cardiomyopathy: Secondary | ICD-10-CM

## 2022-09-09 DIAGNOSIS — Z5900 Homelessness unspecified: Secondary | ICD-10-CM | POA: Diagnosis not present

## 2022-09-09 DIAGNOSIS — N1831 Chronic kidney disease, stage 3a: Secondary | ICD-10-CM | POA: Diagnosis not present

## 2022-09-09 DIAGNOSIS — Z79899 Other long term (current) drug therapy: Secondary | ICD-10-CM | POA: Diagnosis not present

## 2022-09-09 DIAGNOSIS — I5022 Chronic systolic (congestive) heart failure: Secondary | ICD-10-CM | POA: Diagnosis present

## 2022-09-09 DIAGNOSIS — G8929 Other chronic pain: Secondary | ICD-10-CM | POA: Diagnosis present

## 2022-09-09 DIAGNOSIS — Z8679 Personal history of other diseases of the circulatory system: Secondary | ICD-10-CM | POA: Diagnosis not present

## 2022-09-09 DIAGNOSIS — N179 Acute kidney failure, unspecified: Secondary | ICD-10-CM | POA: Diagnosis present

## 2022-09-09 DIAGNOSIS — E669 Obesity, unspecified: Secondary | ICD-10-CM | POA: Diagnosis present

## 2022-09-09 DIAGNOSIS — I420 Dilated cardiomyopathy: Secondary | ICD-10-CM | POA: Diagnosis present

## 2022-09-09 DIAGNOSIS — I130 Hypertensive heart and chronic kidney disease with heart failure and stage 1 through stage 4 chronic kidney disease, or unspecified chronic kidney disease: Secondary | ICD-10-CM | POA: Diagnosis present

## 2022-09-09 DIAGNOSIS — I716 Thoracoabdominal aortic aneurysm, without rupture, unspecified: Secondary | ICD-10-CM | POA: Diagnosis present

## 2022-09-09 DIAGNOSIS — E785 Hyperlipidemia, unspecified: Secondary | ICD-10-CM | POA: Diagnosis present

## 2022-09-09 DIAGNOSIS — M545 Low back pain, unspecified: Secondary | ICD-10-CM | POA: Diagnosis present

## 2022-09-09 DIAGNOSIS — I739 Peripheral vascular disease, unspecified: Secondary | ICD-10-CM | POA: Diagnosis present

## 2022-09-09 DIAGNOSIS — F209 Schizophrenia, unspecified: Secondary | ICD-10-CM | POA: Diagnosis present

## 2022-09-09 DIAGNOSIS — Z8249 Family history of ischemic heart disease and other diseases of the circulatory system: Secondary | ICD-10-CM | POA: Diagnosis not present

## 2022-09-09 DIAGNOSIS — I7123 Aneurysm of the descending thoracic aorta, without rupture: Secondary | ICD-10-CM | POA: Diagnosis present

## 2022-09-09 DIAGNOSIS — F203 Undifferentiated schizophrenia: Secondary | ICD-10-CM | POA: Diagnosis not present

## 2022-09-09 DIAGNOSIS — I700 Atherosclerosis of aorta: Secondary | ICD-10-CM | POA: Diagnosis present

## 2022-09-09 DIAGNOSIS — Z91199 Patient's noncompliance with other medical treatment and regimen due to unspecified reason: Secondary | ICD-10-CM

## 2022-09-09 DIAGNOSIS — Z6828 Body mass index (BMI) 28.0-28.9, adult: Secondary | ICD-10-CM | POA: Diagnosis not present

## 2022-09-09 LAB — URINALYSIS, ROUTINE W REFLEX MICROSCOPIC
Bilirubin Urine: NEGATIVE
Glucose, UA: NEGATIVE mg/dL
Hgb urine dipstick: NEGATIVE
Ketones, ur: NEGATIVE mg/dL
Leukocytes,Ua: NEGATIVE
Nitrite: NEGATIVE
Protein, ur: 100 mg/dL — AB
Specific Gravity, Urine: 1.02 (ref 1.005–1.030)
pH: 5 (ref 5.0–8.0)

## 2022-09-09 LAB — CBC WITH DIFFERENTIAL/PLATELET
Abs Immature Granulocytes: 0.01 10*3/uL (ref 0.00–0.07)
Basophils Absolute: 0 10*3/uL (ref 0.0–0.1)
Basophils Relative: 0 %
Eosinophils Absolute: 0.2 10*3/uL (ref 0.0–0.5)
Eosinophils Relative: 3 %
HCT: 41.3 % (ref 36.0–46.0)
Hemoglobin: 13.1 g/dL (ref 12.0–15.0)
Immature Granulocytes: 0 %
Lymphocytes Relative: 35 %
Lymphs Abs: 1.8 10*3/uL (ref 0.7–4.0)
MCH: 30.1 pg (ref 26.0–34.0)
MCHC: 31.7 g/dL (ref 30.0–36.0)
MCV: 94.9 fL (ref 80.0–100.0)
Monocytes Absolute: 0.4 10*3/uL (ref 0.1–1.0)
Monocytes Relative: 8 %
Neutro Abs: 2.7 10*3/uL (ref 1.7–7.7)
Neutrophils Relative %: 54 %
Platelets: 265 10*3/uL (ref 150–400)
RBC: 4.35 MIL/uL (ref 3.87–5.11)
RDW: 13.7 % (ref 11.5–15.5)
WBC: 5 10*3/uL (ref 4.0–10.5)
nRBC: 0 % (ref 0.0–0.2)

## 2022-09-09 LAB — BASIC METABOLIC PANEL
Anion gap: 9 (ref 5–15)
BUN: 22 mg/dL (ref 8–23)
CO2: 25 mmol/L (ref 22–32)
Calcium: 9.6 mg/dL (ref 8.9–10.3)
Chloride: 104 mmol/L (ref 98–111)
Creatinine, Ser: 1.67 mg/dL — ABNORMAL HIGH (ref 0.44–1.00)
GFR, Estimated: 33 mL/min — ABNORMAL LOW (ref >60)
Glucose, Bld: 129 mg/dL — ABNORMAL HIGH (ref 70–99)
Potassium: 4.6 mmol/L (ref 3.5–5.1)
Sodium: 138 mmol/L (ref 135–145)

## 2022-09-09 LAB — I-STAT CHEM 8, ED
BUN: 26 mg/dL — ABNORMAL HIGH (ref 8–23)
Calcium, Ion: 1.26 mmol/L (ref 1.15–1.40)
Chloride: 107 mmol/L (ref 98–111)
Creatinine, Ser: 1.6 mg/dL — ABNORMAL HIGH (ref 0.44–1.00)
Glucose, Bld: 129 mg/dL — ABNORMAL HIGH (ref 70–99)
HCT: 42 % (ref 36.0–46.0)
Hemoglobin: 14.3 g/dL (ref 12.0–15.0)
Potassium: 4.7 mmol/L (ref 3.5–5.1)
Sodium: 139 mmol/L (ref 135–145)
TCO2: 25 mmol/L (ref 22–32)

## 2022-09-09 LAB — RAPID URINE DRUG SCREEN, HOSP PERFORMED
Amphetamines: NOT DETECTED
Barbiturates: NOT DETECTED
Benzodiazepines: NOT DETECTED
Cocaine: NOT DETECTED
Opiates: NOT DETECTED
Tetrahydrocannabinol: NOT DETECTED

## 2022-09-09 LAB — ETHANOL: Alcohol, Ethyl (B): <10 mg/dL (ref <10)

## 2022-09-09 MED ORDER — DOCUSATE SODIUM 100 MG PO CAPS
100.0000 mg | ORAL_CAPSULE | Freq: Two times a day (BID) | ORAL | Status: DC
  Administered 2022-09-10 – 2022-09-14 (×10): 100 mg via ORAL
  Filled 2022-09-09 (×10): qty 1

## 2022-09-09 MED ORDER — OXYCODONE-ACETAMINOPHEN 5-325 MG PO TABS
1.0000 | ORAL_TABLET | Freq: Once | ORAL | Status: AC
  Administered 2022-09-09: 1 via ORAL
  Filled 2022-09-09: qty 1

## 2022-09-09 MED ORDER — OLANZAPINE 5 MG PO TABS
10.0000 mg | ORAL_TABLET | Freq: Every day | ORAL | Status: DC
  Administered 2022-09-10 – 2022-09-13 (×5): 10 mg via ORAL
  Filled 2022-09-09 (×2): qty 2
  Filled 2022-09-09: qty 1
  Filled 2022-09-09 (×2): qty 2

## 2022-09-09 MED ORDER — SODIUM CHLORIDE 0.9% FLUSH: 3.0000 mL | INTRAVENOUS | Status: DC | PRN

## 2022-09-09 MED ORDER — HYDRALAZINE HCL 25 MG PO TABS: 50.0000 mg | ORAL_TABLET | Freq: Three times a day (TID) | ORAL | Status: DC

## 2022-09-09 MED ORDER — ASPIRIN 81 MG PO TBEC
81.0000 mg | DELAYED_RELEASE_TABLET | Freq: Every day | ORAL | Status: DC
  Administered 2022-09-10 – 2022-09-14 (×5): 81 mg via ORAL
  Filled 2022-09-09 (×5): qty 1

## 2022-09-09 MED ORDER — CARVEDILOL 12.5 MG PO TABS: 25.0000 mg | ORAL_TABLET | Freq: Two times a day (BID) | ORAL | Status: DC

## 2022-09-09 MED ORDER — ACETAMINOPHEN 325 MG PO TABS: 650.0000 mg | ORAL_TABLET | Freq: Four times a day (QID) | ORAL | Status: DC | PRN

## 2022-09-09 MED ORDER — ATORVASTATIN CALCIUM 40 MG PO TABS
40.0000 mg | ORAL_TABLET | Freq: Every day | ORAL | Status: DC
  Administered 2022-09-10 – 2022-09-13 (×5): 40 mg via ORAL
  Filled 2022-09-09 (×5): qty 1

## 2022-09-09 MED ORDER — ACETAMINOPHEN 650 MG RE SUPP: 650.0000 mg | Freq: Four times a day (QID) | RECTAL | Status: DC | PRN

## 2022-09-09 MED ORDER — FUROSEMIDE 40 MG PO TABS
40.0000 mg | ORAL_TABLET | Freq: Every day | ORAL | Status: DC
  Administered 2022-09-10 – 2022-09-14 (×5): 40 mg via ORAL
  Filled 2022-09-09 (×2): qty 1
  Filled 2022-09-09: qty 2
  Filled 2022-09-09 (×2): qty 1

## 2022-09-09 MED ORDER — LIDOCAINE 5 % EX PTCH
1.0000 | MEDICATED_PATCH | CUTANEOUS | Status: DC
  Administered 2022-09-09 – 2022-09-13 (×5): 1 via TRANSDERMAL
  Filled 2022-09-09 (×6): qty 1

## 2022-09-09 MED ORDER — SODIUM CHLORIDE 0.9 % IV SOLN: 250.0000 mL | INTRAVENOUS | Status: DC | PRN

## 2022-09-09 MED ORDER — LABETALOL HCL 5 MG/ML IV SOLN: 20.0000 mg | Freq: Three times a day (TID) | INTRAVENOUS | Status: DC | PRN

## 2022-09-09 MED ORDER — LABETALOL HCL 5 MG/ML IV SOLN: 10.0000 mg | Freq: Once | INTRAVENOUS | Status: DC

## 2022-09-09 MED ORDER — HYDROMORPHONE HCL 1 MG/ML IJ SOLN
0.5000 mg | Freq: Once | INTRAMUSCULAR | Status: AC
  Administered 2022-09-09: 0.5 mg via INTRAVENOUS
  Filled 2022-09-09: qty 1

## 2022-09-09 MED ORDER — ONDANSETRON HCL 4 MG PO TABS: 4.0000 mg | ORAL_TABLET | Freq: Four times a day (QID) | ORAL | Status: DC | PRN

## 2022-09-09 MED ORDER — FERROUS SULFATE 325 (65 FE) MG PO TABS
325.0000 mg | ORAL_TABLET | Freq: Every day | ORAL | Status: DC
  Administered 2022-09-10 – 2022-09-14 (×5): 325 mg via ORAL
  Filled 2022-09-09 (×5): qty 1

## 2022-09-09 MED ORDER — LABETALOL HCL 5 MG/ML IV SOLN: 10.0000 mg | Freq: Three times a day (TID) | INTRAVENOUS | Status: DC | PRN

## 2022-09-09 MED ORDER — ISOSORBIDE MONONITRATE ER 60 MG PO TB24
60.0000 mg | ORAL_TABLET | Freq: Every day | ORAL | Status: DC
  Administered 2022-09-09 – 2022-09-14 (×6): 60 mg via ORAL
  Filled 2022-09-09 (×3): qty 1
  Filled 2022-09-09: qty 2
  Filled 2022-09-09: qty 1
  Filled 2022-09-09: qty 2

## 2022-09-09 MED ORDER — NITROGLYCERIN IN D5W 200-5 MCG/ML-% IV SOLN: 0.0000 ug/min | INTRAVENOUS | Status: DC

## 2022-09-09 MED ORDER — SODIUM CHLORIDE 0.9% FLUSH
3.0000 mL | Freq: Two times a day (BID) | INTRAVENOUS | Status: DC
  Administered 2022-09-10 (×2): 3 mL via INTRAVENOUS

## 2022-09-09 MED ORDER — HYDRALAZINE HCL 25 MG PO TABS
50.0000 mg | ORAL_TABLET | Freq: Once | ORAL | Status: AC
  Administered 2022-09-09: 50 mg via ORAL
  Filled 2022-09-09: qty 2

## 2022-09-09 MED ORDER — HYDRALAZINE HCL 25 MG PO TABS: 50.0000 mg | ORAL_TABLET | Freq: Two times a day (BID) | ORAL | Status: DC

## 2022-09-09 MED ORDER — CARVEDILOL 12.5 MG PO TABS
25.0000 mg | ORAL_TABLET | Freq: Once | ORAL | Status: AC
  Administered 2022-09-09: 25 mg via ORAL
  Filled 2022-09-09: qty 2

## 2022-09-09 MED ORDER — LABETALOL HCL 5 MG/ML IV SOLN
5.0000 mg | Freq: Once | INTRAVENOUS | Status: AC
  Administered 2022-09-09: 5 mg via INTRAVENOUS
  Filled 2022-09-09: qty 4

## 2022-09-09 MED ORDER — IOHEXOL 350 MG/ML SOLN
75.0000 mL | Freq: Once | INTRAVENOUS | Status: AC | PRN
  Administered 2022-09-09: 75 mL via INTRAVENOUS

## 2022-09-09 MED ORDER — OLANZAPINE 10 MG PO TABS: 10.0000 mg | ORAL_TABLET | Freq: Every day | ORAL | Status: DC

## 2022-09-09 MED ORDER — ISOSORBIDE MONONITRATE ER 30 MG PO TB24: 60.0000 mg | ORAL_TABLET | Freq: Every day | ORAL | Status: DC

## 2022-09-09 MED ORDER — MORPHINE SULFATE (PF) 2 MG/ML IV SOLN
2.0000 mg | INTRAVENOUS | Status: DC | PRN
  Administered 2022-09-10 – 2022-09-11 (×7): 2 mg via INTRAVENOUS
  Filled 2022-09-09 (×7): qty 1

## 2022-09-09 MED ORDER — ONDANSETRON HCL 4 MG/2ML IJ SOLN: 4.0000 mg | Freq: Four times a day (QID) | INTRAMUSCULAR | Status: DC | PRN

## 2022-09-09 NOTE — H&P (Addendum)
History and Physical    Erin Riley OZH:[id number] DOB: [birth year] DOA: 09/09/2022  DOS: the patient was seen and examined on 09/10/2022  PCP: Hoy Register, MD   Patient coming from: Patient is coming from group home   Chief Complaint:  Chief Complaint  Patient presents with   Back Pain    HPI:  72 year old female medical history of hypertension, hypertrophic cardiomyopathy, grade 2 diastolic with reduced EF less 25 to 30%, CAD, PAD, CKD stage 3A, hyperlipidemia, sizophrenia and, abdominal aortic aneurysm presented to emergency department for evaluation of left-sided lower back pain.  Patient symptoms started 3 days ago.  She denies any fall and.  Her back pain is nonradiating.  She denies any chest pain, palpitation, headache and blurry vision.  She denies any abdominal pain, increased urinary urgency, frequency, diarrhea, constipation, fever and chills.  She lives in a group home.  She takes an manage her home medications but not very compliant with her blood pressure regimen. Per chart review patient has multiple emergency medicine visit and clinic visit which showed uncontrolled hypertension.   ED Course: In the ER initial presentation heart rate 69, blood pressure 161/89 respiratory 17 O2 sat 98% room air.  However patient blood pressure continued to trended up to 229/111.  Patient got Coreg 25 mg, hydralazine 50 mg, labetalol 5 mg IV, and Imdur 60 mg.  Blood pressure continues to remain high even with multiple doses.  CT angiogram abdomen and pelvis-showed unchanged chronic enlarged descending thoracic aorta, notable for a thrombosed focal aneurysm or nonacute penetrating ulceration at left aspect of the mildly descending vessel maximum caliber of the vessel is 5.5 x 4.1.  Additional unchanged chronic thrombosed focal aneurysm or penetrating ulceration level of diaphragmatic hiatus measuring 5.1 x 4.2.  Fusiform aneurysm of upper abdominal aorta contiguous with aneurysmal  dilation.  Review of Systems:  Review of Systems  Constitutional:  Negative for malaise/fatigue.  Eyes:  Negative for blurred vision and double vision.  Respiratory:  Negative for cough.   Cardiovascular:  Negative for chest pain, palpitations, orthopnea, leg swelling and PND.  Gastrointestinal:  Negative for abdominal pain.  Genitourinary:  Negative for dysuria, frequency and urgency.  Musculoskeletal:  Positive for back pain.  Neurological:  Negative for dizziness, tremors, focal weakness, weakness and headaches.  Psychiatric/Behavioral:  The patient is not nervous/anxious.     Past Medical History:  Diagnosis Date   Chronic back pain    Chronic kidney disease (CKD), stage II (mild)    Class I-II   Coronary artery disease 04/2009   50% stenosis in the perforator of LAD; catheterization was for an abnormal Myoview in January 2000 showing anterior and inferolateral ischemia.   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 > 3 subsequent Echos show improved/normal EF with moderate to severe concentric LVH and diastolic dysfunction with LVOT/intracavitary gradient --> 06/2016: Severe LVH.  Vigorous EF, 65-70%.?? Gr 1 DD. Mild AS.   Hyperlipidemia    Hypertension    Hypertensive hypertrophic cardiomyopathy: NYHA class II:  Echo: Severe concentric LVH with LV OT gradient; essentially preserved EF with diastolic dysfunction 02/15/2013   Echo 06/2016: Severe Concentric LVH. Vigorous EF 65-70%. ~ Gr I DD.    Mild aortic stenosis by prior echocardiography    Echo 06/2016: Mild AS (Mean Gradient 15 mmHg); has had prior Mod-Severe MR (not seen on current echo)   PAD (  peripheral artery disease) [facility]) March 2013   Lower extremity Dopplers: R. SFA 50-60%, R. PTA proximally occluded with distal reconstitution;; L. common iliac ~50%, L. SFA 50-70% stenosis, L. PTA < 50%   Schizophrenia (HCC)      Past Surgical History:  Procedure Laterality Date   BUNIONECTOMY     carotid doppler  05/29/2011   left bulb/prox ICA moderate amtfibrous plaque with no evidence significant reduction.,right bulb /proximal ICA normal patency   lower extremity doppler  05/29/2011   right SFA 50% to 59% diameter reduction,right posterior tibal atreery occlusive disease,reconstituting distally, left common illiac<50%,left SFA 50 to70%,left post. tibial <50%   NM MYOCAR PERF WALL MOTION  03/2009   Persantine; EF 51%-both anterior and inferolateral ischemia   TRANSTHORACIC ECHOCARDIOGRAM  06/2016   Severe LVH.  Vigorous EF of 65-70%.  No RWMA. ~Only grade 1 diastolic dysfunction.  Mild aortic stenosis (mean gradient 15 mmHg)   TRANSTHORACIC ECHOCARDIOGRAM  07/2012   EF 50-55%; severe concentric LVH; only grade 1 diastolic dysfunction. Mild aortic sclerosis - with LVOT /intracavitary gradient of roughly 20 mmHg mean. Mild to moderately dilated LA;; previously reported MR not seen     reports that she has been smoking cigarettes. She has never used smokeless tobacco. She reports that she does not drink alcohol and does not use drugs.  No Known Allergies  Family History  Problem Relation Age of Onset   Hypertension Mother    Breast cancer Neg Hx     Prior to Admission medications   Medication Sig Start Date End Date Taking? Authorizing Provider  aspirin EC 81 MG EC tablet Take 1 tablet (81 mg total) by mouth daily. Swallow whole. Patient not taking: Reported on 06/04/2022 05/21/20   Glenford Bayley, MD  atorvastatin (LIPITOR) 40 MG tablet Take 1 tablet (40 mg total) by mouth at bedtime. 06/14/22   Sarina Ill, DO  carvedilol (COREG) 25 MG tablet Take 1 tablet (25 mg total) by mouth 2 (two) times daily with a meal. 06/14/22   Sarina Ill, DO  ferrous sulfate 325 (65 FE) MG EC tablet Take 325 mg by mouth daily with breakfast. Patient not taking: Reported on 06/04/2022    [provider]  furosemide (LASIX) 40 MG tablet Take 1 tablet (40 mg total) by mouth daily. 06/14/22   Sarina Ill, DO  hydrALAZINE (APRESOLINE) 50 MG tablet Take 1 tablet (50 mg total) by mouth 2 (two) times daily. 06/14/22   Sarina Ill, DO  isosorbide mononitrate (IMDUR) 60 MG 24 hr tablet Take 1 tablet (60 mg total) by mouth daily. 06/15/22   Sarina Ill, DO  Multiple Vitamins-Minerals (MULTIVITAMIN WITH MINERALS) tablet Take 1 tablet by mouth daily. Patient not taking: Reported on 06/04/2022    [provider]  OLANZapine (ZYPREXA) 5 MG tablet Take 2 tablets (10 mg total) by mouth at bedtime. 06/14/22 06/14/23  Sarina Ill, DO  potassium chloride (KLOR-CON M) 10 MEQ tablet Take 1 tablet (10 mEq total) by mouth daily. 06/15/22   Sarina Ill, DO  triamcinolone cream (KENALOG) 0.1 % Apply 1 application topically 2 (two) times daily. Apply to rash on left thigh Patient not taking: Reported on 06/04/2022 05/06/21   Marcine Matar, MD     Physical Exam: Vitals:   09/10/22 0300 09/10/22 0330 09/10/22 0400 09/10/22 0437  BP: 117/62 115/66 128/76 (!) 136/92  Pulse: 82 (!) 57 (!) 51 63  Resp: 14 12 12 16   Temp:  97.9 F (36.6 C)  TempSrc:    Oral  SpO2: 100% 100% 95% 100%  Weight:      Height:        Physical Exam Constitutional:      Appearance: She is obese.  HENT:     Mouth/Throat:     Mouth: Mucous membranes are moist.  Eyes:     Pupils: Pupils are equal, round, and reactive to light.  Cardiovascular:     Rate and Rhythm: Normal rate and regular rhythm.     Pulses: Normal pulses.     Heart sounds: Normal heart sounds.  Pulmonary:     Effort: Pulmonary effort is normal.     Breath sounds: Normal breath sounds.  Abdominal:     General: Bowel sounds are normal. There is no distension.     Tenderness: There is no abdominal tenderness. There is no guarding.  Musculoskeletal:        General: No swelling.      Cervical back: Neck supple.     Right lower leg: No edema.     Left lower leg: No edema.  Skin:    General: Skin is warm.     Capillary Refill: Capillary refill takes less than 2 seconds.  Neurological:     Mental Status: She is alert and oriented to person, place, and time.  Psychiatric:        Mood and Affect: Mood normal.        Thought Content: Thought content normal.        Judgment: Judgment normal.      Labs on Admission: I have personally reviewed following labs and imaging studies  CBC: Recent Labs  Lab 09/09/22 1505 09/09/22 1518 09/10/22 0032  WBC 5.0  --  4.8  NEUTROABS 2.7  --   --   HGB 13.1 14.3 11.8*  HCT 41.3 42.0 37.1  MCV 94.9  --  94.6  PLT 265  --  241   Basic Metabolic Panel: Recent Labs  Lab 09/09/22 1505 09/09/22 1518 09/10/22 0032  NA 138 139 136  K 4.6 4.7 4.3  CL 104 107 105  CO2 25  --  25  GLUCOSE 129* 129* 101*  BUN 22 26* 19  CREATININE 1.67* 1.60* 1.40*  CALCIUM 9.6  --  9.0   GFR: Estimated Creatinine Clearance: 40.5 mL/min (A) (by C-G formula based on SCr of 1.4 mg/dL (H)). Liver Function Tests: Recent Labs  Lab 09/10/22 0032  AST 16  ALT 16  ALKPHOS 68  BILITOT 0.8  PROT 6.7  ALBUMIN 3.1*   No results for input(s): "LIPASE", "AMYLASE" in the last 168 hours. No results for input(s): "AMMONIA" in the last 168 hours. Coagulation Profile: No results for input(s): "INR", "PROTIME" in the last 168 hours. Cardiac Enzymes: No results for input(s): "CKTOTAL", "CKMB", "CKMBINDEX", "TROPONINI", "TROPONINIHS" in the last 168 hours. BNP (last 3 results) Recent Labs    03/25/22 1356  BNP 1,356.3*   HbA1C: No results for input(s): "HGBA1C" in the last 72 hours. CBG: No results for input(s): "GLUCAP" in the last 168 hours. Lipid Profile: No results for input(s): "CHOL", "HDL", "LDLCALC", "TRIG", "CHOLHDL", "LDLDIRECT" in the last 72 hours. Thyroid Function Tests: Recent Labs    09/09/22 1505  TSH 0.515   Anemia  Panel: No results for input(s): "VITAMINB12", "FOLATE", "FERRITIN", "TIBC", "IRON", "RETICCTPCT" in the last 72 hours. Urine analysis:    Component Value Date/Time   COLORURINE YELLOW 09/09/2022 1449   APPEARANCEUR HAZY (  A) 09/09/2022 1449   APPEARANCEUR Clear 08/31/2017 1043   LABSPEC 1.020 09/09/2022 1449   PHURINE 5.0 09/09/2022 1449   GLUCOSEU NEGATIVE 09/09/2022 1449   HGBUR NEGATIVE 09/09/2022 1449   BILIRUBINUR NEGATIVE 09/09/2022 1449   BILIRUBINUR Negative 08/31/2017 1043   KETONESUR NEGATIVE 09/09/2022 1449   PROTEINUR 100 (A) 09/09/2022 1449   UROBILINOGEN 0.2 01/15/2017 0812   NITRITE NEGATIVE 09/09/2022 1449   LEUKOCYTESUR NEGATIVE 09/09/2022 1449    Radiological Exams on Admission: I have personally reviewed images CT Angio Chest/Abd/Pel for Dissection W and/or Wo Contrast  Result Date: 09/09/2022 CLINICAL DATA:  Acute aortic syndrome suspected EXAM: CT ANGIOGRAPHY CHEST, ABDOMEN AND PELVIS TECHNIQUE: Non-contrast CT of the chest was initially obtained. Multidetector CT imaging through the chest, abdomen and pelvis was performed using the standard protocol during bolus administration of intravenous contrast. Multiplanar reconstructed images and MIPs were obtained and reviewed to evaluate the vascular anatomy. RADIATION DOSE REDUCTION: This exam was performed according to the departmental dose-optimization program which includes automated exposure control, adjustment of the mA and/or kV according to patient size and/or use of iterative reconstruction technique. CONTRAST:  75mL OMNIPAQUE IOHEXOL 350 MG/ML SOLN COMPARISON:  07/19/2022, 01/17/2019 FINDINGS: CTA CHEST FINDINGS VASCULAR Aorta: Satisfactory opacification of the aorta. Unchanged contour and caliber of the thoracic aorta, tubular ascending thoracic aorta measuring up to 3.9 x 3.8 cm. Unchanged enlargement of the descending thoracic aorta, notable for a thrombosed focal aneurysm or nonacute penetrating at the left aspect  of the mid descending vessel, maximum caliber of the vessel at this level 5.5 x 4.1 cm (series 7, image 76). 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 Severe mixed calcific atherosclerosis Cardiovascular: No evidence of pulmonary embolism on limited non-tailored examination. Cardiomegaly. No pericardial effusion. Review of the MIP images confirms the above findings. NON VASCULAR Mediastinum/Nodes: No enlarged mediastinal, hilar, or axillary lymph nodes. Thyroid gland, trachea, and esophagus demonstrate no significant findings. Lungs/Pleura: Lungs are clear. No pleural effusion or pneumothorax. Musculoskeletal: No chest wall abnormality. No acute osseous findings. Review of the MIP images confirms the above findings. CTA ABDOMEN AND PELVIS FINDINGS VASCULAR Unchanged contour and caliber of the abdominal aorta. Severe mixed atherosclerosis with fusiform aneurysm of the upper abdominal aorta, contiguous with aneurysmal dilatation described above, without aneurysm of the infrarenal vessel. No evidence of dissection or other acute aortic pathology. Standard branching pattern of the abdominal aorta with solitary bilateral renal arteries. Review of the MIP images confirms the above findings. NON-VASCULAR Hepatobiliary: No solid liver abnormality is seen. No gallstones, gallbladder wall thickening, or biliary dilatation. Pancreas: Unremarkable. No pancreatic ductal dilatation or surrounding inflammatory changes. Spleen: Normal in size without significant abnormality. Adrenals/Urinary Tract: Adrenal glands are unremarkable. Kidneys are normal, without renal calculi, solid lesion, or hydronephrosis. Bladder is unremarkable. Stomach/Bowel: Stomach is within normal limits. Appendix appears normal. No evidence of bowel wall thickening, distention, or inflammatory changes. Descending and sigmoid  diverticulosis. Lymphatic: No enlarged abdominal or pelvic lymph nodes. Reproductive: No mass. Similar loculated fluid or cystic lesion of the right ovary or adnexa measuring 4.8 x 2.5 cm, stable over numerous prior examinations dating back to at least 2020 and presumed benign (series 7, image 263) Other: No abdominal wall hernia or abnormality. No ascites. Musculoskeletal: No acute osseous findings. IMPRESSION: 1. Unchanged enlargement of the descending thoracic aorta, notable for a thrombosed focal aneurysm or nonacute penetrating ulceration  at the left aspect of the mid descending vessel, maximum caliber of the vessel at this level 5.5 x 4.1 cm. 2. 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 3. 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 4. No evidence of dissection or other acute aortic pathology. 5. Severe mixed atherosclerosis throughout. 6. No acute findings. Aortic Atherosclerosis (ICD10-I70.0). Electronically Signed   By: Jearld Lesch M.D.   On: 09/09/2022 17:42    EKG: My personal interpretation of EKG shows: Sinus rhythm, premature atrial complex, left atrial enlargement, left ventricular hypertrophy pattern.   Assessment/Plan: Principal Problem:   Hypertensive crisis Active Problems:   Hypertrophic cardiomyopathy (HCC)   CKD stage 3a, GFR 45-59 ml/min (HCC)   Schizophrenia (HCC)   History of abdominal aortic aneurysm (AAA)   CAD (coronary artery disease)   Chronic systolic CHF (congestive heart failure) reduced EF 30-35 (HCC)    Assessment and Plan: Hypertensive crisis Essential hypertension-medication noncompliance Hypertrophic cardiomyopathy and grade 2 diastolic HFrEF 25 to 30% Combined systolic and diastolic heart failure Patient is history of hypertension, currently  she is homeless and not taking home blood pressure regimen currently. - In the ER initial presentation heart rate 69, blood pressure 161/89 respiratory 17 O2 sat 98% room air.  However patient blood pressure continued to trended up to 229/111.  Patient got Coreg 25 mg, hydralazine 50 mg, labetalol 5 mg IV, and Imdur 60 mg.  Blood pressure continues to remain high even with multiple doses. -As patient has uncontrolled hypertension and at the same time she has a history of abdominal aneurysm reached out to cardiology and cardiology recommended to resume home blood pressure regimen, no need for IV drip and get an echocardiogram. -If there is any concerning finding echocardiogram requested to reach back to cardiology for formal consult. -Consulted advance heart failure team for further evaluation assessment. -Per chart review patient's home blood pressure regimen include Coreg 25 mg twice daily, Lasix 40 mg daily, Imdur 60 mg daily, hydralazine 50 mg twice daily. -CTA chest obtained did not showed any evidence of flash pulmonary edema. - CTA chest showed stable chronic descending thoracic aorta aneurysm.  Stable in size 5.5 x 4.1 cm.   -As patient is bradycardic holding Coreg for now.   -Restarting 10 hydralazine 50 mg twice daily, Imdur 60 mg daily and Lasix 40 mg daily -Continue hydralazine 10 mg every 6 hours as needed. - Plan to decrease blood pressure gradually to prevent any ischemic change. -Goal is to decrease blood pressure less than 180/120 in first hour and less than 160/110 over the next 23 hours. -Starting Jardiance 10 mg daily. -Obtaining echocardiogram. -Continue daily monitoring for development of any arrhythmia.  Thoracic aortic aneurysm -CT angiogram abdomen and pelvis-showed unchanged chronic enlarged descending thoracic aorta, notable for a thrombosed focal aneurysm or nonacute penetrating ulceration at left aspect of the mildly descending vessel maximum caliber of the vessel is 5.5  x 4.1.  Additional unchanged chronic thrombosed focal aneurysm or penetrating ulceration level of diaphragmatic hiatus measuring 5.1 x 4.2.  Fusiform aneurysm of upper abdominal aorta contiguous with aneurysmal dilation. -When comparing with the CT angiogram of the chest from 07/2022 the new finding is thrombosed focal aneurys at the left aspect of the mildly descending vessel. -There is no CT abdomen from last 6 months to compare the progression. -  Patient will need to follow-up with outpatient vascular surgery close monitoring and intervention. -Once bradycardia improved need to restart Coreg and will continue high intensity statin Lipitor 40 mg daily.  History of CAD -Chart review patient seen cardiology in the clinic on 02/07/2019 which is 4 years ago.  That time recommendation was continue Lipitor, Imdur, Coreg, hydralazine.  -Currently on continue aspirin and Lipitor -Can resume beta-blocker once bradycardia resolves  CKD stage 3B -Creatinine on presentation 1.4 (baseline creatinine between 1.6-1.7) -GFR in between 31-40. -Need to send to nephrology outpatient for follow-up  Schizophrenia -Resume home Zyprexa 10 mg at bedtime.  DVT prophylaxis: SCDs.  Hold pharmacological prophylaxis in the setting of hypertensive crisis.  Once blood pressure is stabilized can start either Lovenox or heparin Code Status: Full Code Diet: Heart healthy diet Family Communication: Unable to reach out to patient's son Timoteo Expose [phone]).  Need to try again Disposition Plan: Consulted transition care team for assisted living facility placement and medication assistance Consults: TCR Admission status: Inpatient, Step Down Unit  Severity of Illness: The appropriate patient status for this patient is INPATIENT. Inpatient status is judged to be reasonable and necessary in order to provide the required intensity of service to ensure the patient's safety. The patient's presenting symptoms, physical exam  findings, and initial radiographic and laboratory data in the context of their chronic comorbidities is felt to place them at high risk for further clinical deterioration. Furthermore, it is not anticipated that the patient will be medically stable for discharge from the hospital within 2 midnights of admission.   * I certify that at the point of admission it is my clinical judgment that the patient will require inpatient hospital care spanning beyond 2 midnights from the point of admission due to high intensity of service, high risk for further deterioration and high frequency of surveillance required.[name]    Tereasa Coop MD [facility]  How to contact the [facility] Attending or Consulting provider 7A - 7P or covering provider during after hours 7P -7A, for this patient?   Check the care team in [facility] and look for a) attending/consulting TR[facility] provider listed and b) the [facility] team listed Log into [email or web address] and use [place]'s universal password to access. If you do not have the password, please contact the hospital operator. Locate the [facility] provider you are looking for under [facility] and page to a number that you can be directly reached. If you still have difficulty reaching the provider, please page the [facility] (Director on Call) for the Hospitalists listed on amion for assistance.  09/10/2022, 6:03 AM

## 2022-09-09 NOTE — ED Provider Notes (Signed)
Accepted handoff at shift change from [facility]. Please see prior provider note for more detail.   Briefly: Patient is 72 y.o.   DDX: concern for HTN crisis with known aneurysm, unchanged since last month -- schizophrenic, group home, questionable med use. Arrived with left sided low back pain. Uncontrolled schizophrenia not currently being managed.   Plan: Patient admitted to Dr. Janalyn Shy, I spoke with cardiology who did not have any acute blood pressure recommendations, reporting that this is typically managed by internal medicine.  Dr. Janalyn Shy informed, and will admit patient at this time.     [place] 09/09/22 2246    Wynetta Fines, MD 09/10/22 [phone]

## 2022-09-09 NOTE — ED Provider Notes (Cosign Needed Addendum)
[place] EMERGENCY DEPARTMENT AT [facility] Provider Note   CSN: [id number] Arrival date & time: 09/09/22  1434     History  Chief Complaint  Patient presents with   Back Pain    Erin Riley is a 72 y.o. female.  With a history of hypertension, CKD, CAD, chronic back pain, hyperlipidemia, schizophrenia, abdominal aortic aneurysm who presents to the ED for evaluation of left-sided low back pain.  The symptoms began approximately 3 days ago.  She denies any trauma or falls.  She has not tried anything at home for her symptoms.  Pain does not radiate from the low back.  Specifically does not go down the legs.  She denies any lower extremity numbness, weakness or tingling.  No saddle paresthesias.  No urinary or fecal incontinence.  No fevers or history of injection drug use.  No abdominal pain.  No dysuria, frequency or urgency.  She denies any chest pain.  Patient lives in a group home.  States she manages her own medications.  She does report compliance with her medications.  d   Back Pain      Home Medications Prior to Admission medications   Medication Sig Start Date End Date Taking? Authorizing Provider  aspirin EC 81 MG EC tablet Take 1 tablet (81 mg total) by mouth daily. Swallow whole. Patient not taking: Reported on 06/04/2022 05/21/20   Glenford Bayley, MD  atorvastatin (LIPITOR) 40 MG tablet Take 1 tablet (40 mg total) by mouth at bedtime. 06/14/22   Sarina Ill, DO  carvedilol (COREG) 25 MG tablet Take 1 tablet (25 mg total) by mouth 2 (two) times daily with a meal. 06/14/22   Sarina Ill, DO  ferrous sulfate 325 (65 FE) MG EC tablet Take 325 mg by mouth daily with breakfast. Patient not taking: Reported on 06/04/2022    [provider]  furosemide (LASIX) 40 MG tablet Take 1 tablet (40 mg total) by mouth daily. 06/14/22   Sarina Ill, DO  hydrALAZINE (APRESOLINE) 50 MG tablet Take 1 tablet (50 mg total) by mouth 2 (two)  times daily. 06/14/22   Sarina Ill, DO  isosorbide mononitrate (IMDUR) 60 MG 24 hr tablet Take 1 tablet (60 mg total) by mouth daily. 06/15/22   Sarina Ill, DO  Multiple Vitamins-Minerals (MULTIVITAMIN WITH MINERALS) tablet Take 1 tablet by mouth daily. Patient not taking: Reported on 06/04/2022    [provider]  OLANZapine (ZYPREXA) 5 MG tablet Take 2 tablets (10 mg total) by mouth at bedtime. 06/14/22 06/14/23  Sarina Ill, DO  potassium chloride (KLOR-CON M) 10 MEQ tablet Take 1 tablet (10 mEq total) by mouth daily. 06/15/22   Sarina Ill, DO  triamcinolone cream (KENALOG) 0.1 % Apply 1 application topically 2 (two) times daily. Apply to rash on left thigh Patient not taking: Reported on 06/04/2022 05/06/21   Marcine Matar, MD      Allergies    Patient has no known allergies.    Review of Systems   Review of Systems  Musculoskeletal:  Positive for back pain.  All other systems reviewed and are negative.   Physical Exam Updated Vital Signs BP (!) 210/194   Pulse (!) 103   Temp 97.6 F (36.4 C) (Oral)   Resp 18   Ht 5\' 7"  (1.702 m)   Wt 81.6 kg   LMP 05/10/2013   SpO2 99%   BMI 28.19 kg/m  Physical Exam Vitals and nursing  note reviewed.  Constitutional:      General: She is not in acute distress.    Appearance: She is well-developed.     Comments: Resting comfortably in bed  HENT:     Head: Normocephalic and atraumatic.  Eyes:     Conjunctiva/sclera: Conjunctivae normal.  Cardiovascular:     Rate and Rhythm: Normal rate and regular rhythm.     Heart sounds: No murmur heard. Pulmonary:     Effort: Pulmonary effort is normal. No respiratory distress.     Breath sounds: Normal breath sounds.  Abdominal:     Palpations: Abdomen is soft.     Tenderness: There is no abdominal tenderness.  Musculoskeletal:        General: No swelling.     Cervical back: Neck supple.     Comments: Negative straight leg raise test  bilaterally.  Sensation intact in all digits of bilateral lower extremities.  DP pulses 2+ bilaterally.  Surgical scar to the low back which is well-healed.  Moderate TTP to the left low back.  No midline step-offs, deformities or crepitus.  No specific midline tenderness.  No bruising or lesions of the low back.  No rashes.  5 out of 5 strength in bilateral hip flexion and extension.  Normal observed gait  Skin:    General: Skin is warm and dry.     Capillary Refill: Capillary refill takes less than 2 seconds.  Neurological:     Mental Status: She is alert.  Psychiatric:        Mood and Affect: Mood normal.     ED Results / Procedures / Treatments   Labs (all labs ordered are listed, but only abnormal results are displayed) Labs Reviewed  URINALYSIS, ROUTINE W REFLEX MICROSCOPIC - Abnormal; Notable for the following components:      Result Value   APPearance HAZY (*)    Protein, ur 100 (*)    Bacteria, UA RARE (*)    All other components within normal limits  BASIC METABOLIC PANEL - Abnormal; Notable for the following components:   Glucose, Bld 129 (*)    Creatinine, Ser 1.67 (*)    GFR, Estimated 33 (*)    All other components within normal limits  I-STAT CHEM 8, ED - Abnormal; Notable for the following components:   BUN 26 (*)    Creatinine, Ser 1.60 (*)    Glucose, Bld 129 (*)    All other components within normal limits  CBC WITH DIFFERENTIAL/PLATELET  RAPID URINE DRUG SCREEN, HOSP PERFORMED  ETHANOL    EKG EKG Interpretation Date/Time:  Saturday September 09 2022 21:20:48 EDT Ventricular Rate:  58 PR Interval:  196 QRS Duration:  117 QT Interval:  445 QTC Calculation: 438 R Axis:   7  Text Interpretation: Sinus rhythm Atrial premature complexes in couplets Left atrial enlargement LVH with IVCD and secondary repol abnrm Baseline wander in lead(s) V2 similar to Mar 2024 Confirmed by Pricilla Loveless [phone]) on 09/09/2022 9:24:24 PM  Radiology CT Angio Chest/Abd/Pel for  Dissection W and/or Wo Contrast  Result Date: 09/09/2022 CLINICAL DATA:  Acute aortic syndrome suspected EXAM: CT ANGIOGRAPHY CHEST, ABDOMEN AND PELVIS TECHNIQUE: Non-contrast CT of the chest was initially obtained. Multidetector CT imaging through the chest, abdomen and pelvis was performed using the standard protocol during bolus administration of intravenous contrast. Multiplanar reconstructed images and MIPs were obtained and reviewed to evaluate the vascular anatomy. RADIATION DOSE REDUCTION: This exam was performed according to the departmental dose-optimization program  which includes automated exposure control, adjustment of the mA and/or kV according to patient size and/or use of iterative reconstruction technique. CONTRAST:  75mL OMNIPAQUE IOHEXOL 350 MG/ML SOLN COMPARISON:  07/19/2022, 01/17/2019 FINDINGS: CTA CHEST FINDINGS VASCULAR Aorta: Satisfactory opacification of the aorta. Unchanged contour and caliber of the thoracic aorta, tubular ascending thoracic aorta measuring up to 3.9 x 3.8 cm. Unchanged enlargement of the descending thoracic aorta, notable for a thrombosed focal aneurysm or nonacute penetrating at the left aspect of the mid descending vessel, maximum caliber of the vessel at this level 5.5 x 4.1 cm (series 7, image 76). 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 Severe mixed calcific atherosclerosis Cardiovascular: No evidence of pulmonary embolism on limited non-tailored examination. Cardiomegaly. No pericardial effusion. Review of the MIP images confirms the above findings. NON VASCULAR Mediastinum/Nodes: No enlarged mediastinal, hilar, or axillary lymph nodes. Thyroid gland, trachea, and esophagus demonstrate no significant findings. Lungs/Pleura: Lungs are clear. No pleural effusion or pneumothorax. Musculoskeletal: No chest wall abnormality. No acute  osseous findings. Review of the MIP images confirms the above findings. CTA ABDOMEN AND PELVIS FINDINGS VASCULAR Unchanged contour and caliber of the abdominal aorta. Severe mixed atherosclerosis with fusiform aneurysm of the upper abdominal aorta, contiguous with aneurysmal dilatation described above, without aneurysm of the infrarenal vessel. No evidence of dissection or other acute aortic pathology. Standard branching pattern of the abdominal aorta with solitary bilateral renal arteries. Review of the MIP images confirms the above findings. NON-VASCULAR Hepatobiliary: No solid liver abnormality is seen. No gallstones, gallbladder wall thickening, or biliary dilatation. Pancreas: Unremarkable. No pancreatic ductal dilatation or surrounding inflammatory changes. Spleen: Normal in size without significant abnormality. Adrenals/Urinary Tract: Adrenal glands are unremarkable. Kidneys are normal, without renal calculi, solid lesion, or hydronephrosis. Bladder is unremarkable. Stomach/Bowel: Stomach is within normal limits. Appendix appears normal. No evidence of bowel wall thickening, distention, or inflammatory changes. Descending and sigmoid diverticulosis. Lymphatic: No enlarged abdominal or pelvic lymph nodes. Reproductive: No mass. Similar loculated fluid or cystic lesion of the right ovary or adnexa measuring 4.8 x 2.5 cm, stable over numerous prior examinations dating back to at least 2020 and presumed benign (series 7, image 263) Other: No abdominal wall hernia or abnormality. No ascites. Musculoskeletal: No acute osseous findings. IMPRESSION: 1. Unchanged enlargement of the descending thoracic aorta, notable for a thrombosed focal aneurysm or nonacute penetrating ulceration at the left aspect of the mid descending vessel, maximum caliber of the vessel at this level 5.5 x 4.1 cm. 2. 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 3. 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 4. No evidence of dissection or other acute aortic pathology. 5. Severe mixed atherosclerosis throughout. 6. No acute findings. Aortic Atherosclerosis (ICD10-I70.0). Electronically Signed   By: Jearld Lesch M.D.   On: 09/09/2022 17:42    Procedures Procedures    Medications Ordered in ED Medications  lidocaine (LIDODERM) 5 % 1 patch (1 patch Transdermal Patch Applied 09/09/22 1808)  OLANZapine (ZYPREXA) tablet 10 mg (has no administration in time range)  isosorbide mononitrate (IMDUR) 24 hr tablet 60 mg (60 mg Oral Given 09/09/22 1934)  oxyCODONE-acetaminophen (PERCOCET/ROXICET) 5-325 MG per tablet 1 tablet (1 tablet Oral Given 09/09/22 1506)  iohexol (  OMNIPAQUE) 350 MG/ML injection 75 mL (75 mLs Intravenous Contrast Given 09/09/22 1724)  carvedilol (COREG) tablet 25 mg (25 mg Oral Given 09/09/22 1934)  hydrALAZINE (APRESOLINE) tablet 50 mg (50 mg Oral Given 09/09/22 1934)  HYDROmorphone (DILAUDID) injection 0.5 mg (0.5 mg Intravenous Given 09/09/22 1934)  labetalol (NORMODYNE) injection 5 mg (5 mg Intravenous Given 09/09/22 2111)    ED Course/ Medical Decision Making/ A&P Clinical Course as of 09/09/22 2130  Sat Sep 09, 2022  1943 Unable to contact son for additional history [AS]  2109 HTN crisis with known aneurysm, unchanged since last month -- schizophrenic, group home, questionable med use. Arrived with left sided low back pain. Uncontrolled schizophrenia not currently being managed.  [CP]    Clinical Course User Index [AS] Yasuo Phimmasone, Edsel Petrin, PA-C [CP] Montel Clock Harrel Carina, PA-C                             Medical Decision Making Amount and/or Complexity of Data Reviewed Labs: ordered. Radiology: ordered.  Risk Prescription drug management. Decision regarding hospitalization.  This patient presents to the ED  for concern of left sided low back pain, this involves an extensive number of treatment options, and is a complaint that carries with it a high risk of complications and morbidity.  Emergent considerations in the differential diagnosis of back pain include:occult fracture, congenital anomalies, tumors, vascular catastrophes, osteomyelitis of vertebrae, infections of disc, meninges or cord, space occupying lesions within canal leading to cord or root compression including epidural abscess.   Co morbidities that complicate the patient evaluation  hypertension, CKD, CAD, chronic back pain, hyperlipidemia, schizophrenia, abdominal aortic aneurysm  My initial workup includes labs, imaging  Additional history obtained from: Nursing notes from this visit. Previous records within EMR system ED visit for similar on 07/19/2022  I ordered, reviewed and interpreted labs which include: BMP, CBC, urinalysis.  1.67 similar to previous.  Urine without evidence of infection  I ordered imaging studies including CT dissection study I independently visualized and interpreted imaging which showed stable since previous imaging.  No acute changes.  No evidence of dissection. I agree with the radiologist interpretation  Spoke with hospitalist Dr. Janalyn Shy regarding admission.  She states they will take her approximately 1.5 hours to assess this patient.  She is requesting cardiology consult for blood pressure management recommendations given her AAA.  Will place consult.  Afebrile, significantly hypertensive but otherwise hemodynamically stable.  72 year old female presenting to the ED for evaluation of left-sided low back pain.  She was found to have an aortic aneurysm on a similar presentation on 07/19/2022.  At that time she was having some paranoid and delusional thoughts.  She does live in a group home and manages her own medications.  She states she takes her medications as prescribed however she still has  episodes of paranoia while in the emergency department.  There is some concern about her being compliant with her medications at home. I attempted to contact her son for more information but was unable to reach him.  Patient was given antihypertensives and pain control in the ED and her blood pressure continued to be significantly elevated.  In the setting of her abdominal aortic aneurysm and uncontrolled hypertension, believe patient would benefit from admission to the hospital for blood pressure control.  Patient was initially declining admission to the hospital and TTS consult so she was placed under IVC as she is a danger to herself  however she is now agreeable to this plan given her severity of hypertension. Believe she would benefit from TTS consult due to her decompensated schizophrenia however I am unable to medically clear her at this time. Overall I suspect her left-sided low back pain to be musculoskeletal.  She denies any red flag symptoms of low back pain.  She does have reproducible pain with specific movements and tenderness to palpation specifically of the musculature of the left lumbar region. Care will be handed off to oncoming provider pending cardiology consult.  Overall I suspect her left-sided low back pain to be musculoskeletal.  She denies any red flag symptoms of low back pain.  She does have reproducible pain with specific movements and tenderness to palpation specifically of the musculature of the left lumbar region.  Patient's case discussed with Dr. Durwin Nora who agrees with plan to admit.   Note: Portions of this report may have been transcribed using voice recognition software. Every effort was made to ensure accuracy; however, inadvertent computerized transcription errors may still be present.        Final Clinical Impression(s) / ED Diagnoses Final diagnoses:  Acute left-sided low back pain without sciatica  Schizophrenia, unspecified type [facility])  Hypertensive crisis     Rx / DC Orders ED Discharge Orders     None         Michelle Piper, PA-C 09/09/22 2116    Michelle Piper, PA-C 09/09/22 2130    [name], MD 09/10/22 2134

## 2022-09-09 NOTE — ED Provider Triage Note (Signed)
Emergency Medicine Provider Triage Evaluation Note  Erin Riley , a 72 y.o. female  was evaluated in triage.  Pt complains of low back pain.  Review of Systems  Positive: Low back pain Negative: Abdominal pain, shortness of breath, syncope  Physical Exam  BP (!) 161/89 (BP Location: Left Arm)   Pulse 69   Temp 98.3 F (36.8 C) (Oral)   Resp 17   Ht 5\' 7"  (1.702 m)   Wt 81.6 kg   LMP 05/10/2013   SpO2 98%   BMI 28.19 kg/m  Gen:   Awake, no distress  Resp:  Normal effort MSK:   Moves extremities without difficulty, tenderness to paraspinous areas of L4-5 level Other:  Abdomen is soft and nontender  Medical Decision Making  Medically screening exam initiated at 3:02 PM.  Appropriate orders placed.  [name] was informed that the remainder of the evaluation will be completed by another provider, this initial triage assessment does not replace that evaluation, and the importance of remaining in the ED until their evaluation is complete.  Patient presents for low back pain.  She is a very poor historian but is able to state that back pain has been ongoing for the past 3 days and is severe.  She describes the area of pain as L4-5.  It is worsened with movements.  It is tender on palpation.  Patient has a known thoracoabdominal aneurysm, last measured at 5.5 cm 2 months ago, which is a concern.  However, history and exam are more consistent with musculoskeletal etiology.   Erin Manchester, MD 09/09/22 [id number]

## 2022-09-09 NOTE — ED Triage Notes (Signed)
Pt bib [facility] from home with complaints of left lower back pain that stated 3 days ago and is now radiating into her mid back. Denies urinary sx, falls, other injuries. AOx4, Ambulatory. Resp e/u

## 2022-09-10 DIAGNOSIS — M545 Low back pain, unspecified: Secondary | ICD-10-CM | POA: Diagnosis not present

## 2022-09-10 DIAGNOSIS — R001 Bradycardia, unspecified: Secondary | ICD-10-CM | POA: Diagnosis present

## 2022-09-10 DIAGNOSIS — F209 Schizophrenia, unspecified: Secondary | ICD-10-CM

## 2022-09-10 DIAGNOSIS — I169 Hypertensive crisis, unspecified: Secondary | ICD-10-CM | POA: Diagnosis not present

## 2022-09-10 LAB — COMPREHENSIVE METABOLIC PANEL
ALT: 16 U/L (ref 0–44)
AST: 16 U/L (ref 15–41)
Albumin: 3.1 g/dL — ABNORMAL LOW (ref 3.5–5.0)
Alkaline Phosphatase: 68 U/L (ref 38–126)
Anion gap: 6 (ref 5–15)
BUN: 19 mg/dL (ref 8–23)
CO2: 25 mmol/L (ref 22–32)
Calcium: 9 mg/dL (ref 8.9–10.3)
Chloride: 105 mmol/L (ref 98–111)
Creatinine, Ser: 1.4 mg/dL — ABNORMAL HIGH (ref 0.44–1.00)
GFR, Estimated: 40 mL/min — ABNORMAL LOW (ref >60)
Glucose, Bld: 101 mg/dL — ABNORMAL HIGH (ref 70–99)
Potassium: 4.3 mmol/L (ref 3.5–5.1)
Sodium: 136 mmol/L (ref 135–145)
Total Bilirubin: 0.8 mg/dL (ref 0.3–1.2)
Total Protein: 6.7 g/dL (ref 6.5–8.1)

## 2022-09-10 LAB — CBC
HCT: 37.1 % (ref 36.0–46.0)
Hemoglobin: 11.8 g/dL — ABNORMAL LOW (ref 12.0–15.0)
MCH: 30.1 pg (ref 26.0–34.0)
MCHC: 31.8 g/dL (ref 30.0–36.0)
MCV: 94.6 fL (ref 80.0–100.0)
Platelets: 241 10*3/uL (ref 150–400)
RBC: 3.92 MIL/uL (ref 3.87–5.11)
RDW: 13.6 % (ref 11.5–15.5)
WBC: 4.8 10*3/uL (ref 4.0–10.5)
nRBC: 0 % (ref 0.0–0.2)

## 2022-09-10 LAB — TSH: TSH: 0.515 u[IU]/mL (ref 0.350–4.500)

## 2022-09-10 MED ORDER — LABETALOL HCL 5 MG/ML IV SOLN: 10.0000 mg | Freq: Three times a day (TID) | INTRAVENOUS | Status: DC | PRN

## 2022-09-10 MED ORDER — HYDRALAZINE HCL 20 MG/ML IJ SOLN
10.0000 mg | Freq: Three times a day (TID) | INTRAMUSCULAR | Status: DC | PRN
  Administered 2022-09-10: 10 mg via INTRAVENOUS
  Filled 2022-09-10: qty 1

## 2022-09-10 MED ORDER — HYDRALAZINE HCL 50 MG PO TABS
50.0000 mg | ORAL_TABLET | Freq: Three times a day (TID) | ORAL | Status: DC
  Administered 2022-09-10 – 2022-09-12 (×5): 50 mg via ORAL
  Filled 2022-09-10 (×6): qty 1

## 2022-09-10 MED ORDER — EMPAGLIFLOZIN 10 MG PO TABS
10.0000 mg | ORAL_TABLET | Freq: Every day | ORAL | Status: DC
  Administered 2022-09-10 – 2022-09-14 (×5): 10 mg via ORAL
  Filled 2022-09-10 (×5): qty 1

## 2022-09-10 MED ORDER — HYDRALAZINE HCL 25 MG PO TABS
50.0000 mg | ORAL_TABLET | Freq: Two times a day (BID) | ORAL | Status: DC
  Administered 2022-09-10: 50 mg via ORAL
  Filled 2022-09-10: qty 2

## 2022-09-10 NOTE — ED Notes (Signed)
When PT sleeps HR drops to 47bpm. MD notified EKG Transmitted

## 2022-09-10 NOTE — Plan of Care (Signed)

## 2022-09-10 NOTE — Progress Notes (Signed)
[facility]                                                                              Erin Riley, is a 72 y.o. female, DOB - 1950-08-05, ZOX:[id number] Admit date - 09/09/2022    Outpatient Primary MD for the patient is Hoy Register, MD  LOS - 1  days  Chief Complaint  Patient presents with   Back Pain       Brief summary   Patient is a 72 year old female with HTN, hypertrophic cardiomyopathy, chronic systolic and diastolic CHF with EF 25 to 30%, G2 DD, PAD, CKD stage IIIa, hyperlipidemia, schizophrenia, AAA presented to ED from group home with left-sided lower back pain.  Patient's symptoms started 3 days prior to admission, denied any fall.  Reported back pain is nonradiating, no chest pain, palpitations, blurry vision, headache.  Lives in a group home, noncompliant with her BP medications. In ED, BP 229/111, heart rate 69, RR 17, O2 sats 98% on room air Patient received Coreg, hydralazine, labetalol, Imdur however BP continued to remain high hence was admitted for further workup. CTA abdomen pelvis showed chronic enlarged ascending thoracic aorta, thrombosed focal aneurysm or nonacute penetrating ulceration at left aspect of the mildly descending vessel maximum caliber of the vessel is 5.5 x 4.1. Additional unchanged chronic thrombosed focal aneurysm or penetrating ulceration level of diaphragmatic hiatus measuring 5.1 x 4.2. Fusiform aneurysm of upper abdominal aorta contiguous with aneurysmal dilation.    Assessment & Plan    Principal Problem:   Hypertensive crisis History of essential hypertension, with medication noncompliance -Presented with BP of 229/111, received Coreg, hydralazine, labetalol, Imdur overnight in ED, BP still somewhat elevated.  Noted to have bradycardia, hold off on Coreg. -Continue Imdur 60 mg daily, increase hydralazine to 50 mg 3 times daily, Lasix 40 mg daily. No BB due to bradycardia, no ACEI/ARB due to renal insufficiency. -Follow  2D echo.  CTA chest did not show any flash pulmonary edema, no hypoxia. - reduce coreg dose when bradycardia resolves.   Active Problems:   Aneurysm of descending thoracic aorta without rupture (HCC) -Needs strict BP control, counseled on medication compliance -CT angiogram chest abdomen pelvis showed unchanged enlargement of the descending thoracic aorta, notable for a thrombosed focal aneurysm or nonacute penetrating ulceration at the left aspect of the mid descending vessel, maximum caliber at this level 5.5x 4.1 cm.  Additional unchanged chronic thrombus focal aneurysm or penetrating ulceration at the level of the diaphragmatic hiatus, 5.1x 4.2 cm.  Fusiform aneurysm of upper abdominal aorta.  No dissection. -Patient has not seen vascular surgery prior to this.  She was seen in ED on 07/19/2022, vascular surgery referral was sent, however does not appear that patient kept with appointment. -Continue strict BP control, statin, d/w Dr. Randie Heinz, vascular surgery on-call    Hypertrophic cardiomyopathy [facility]), sinus bradycardia -Patient had received Coreg 25 mg in ED, subsequently noted to have heart rate in 40s on sleeping.  EKG did not show any pauses or high-grade block.  -Hold Coreg for now once heart rate has improved, will decrease dose    CAD (coronary artery disease)  Chronic systolic CHF (congestive heart failure) reduced EF 30-35 (HCC) -Currently no acute chest pain or shortness of breath.  Last seen in the cardiology clinic on 02/07/2019 -2D echo 03/2022 had shown EF of 25 to 30%, global hypokinesis, G2 DD, mildly reduced RV SF, mild MR, TR -Continue aspirin, Lipitor, Lasix, BP control - follow 2D echo    CKD stage 3b, GFR 45-59 ml/min (HCC) -Baseline creatinine between 1.4-1.7 -Presented with creatinine of 1.67, improved to 1.4 this morning -UA shows proteinuria on UA, will need to be placed on ACE/ARBs, needs outpatient follow-up with nephrology    Schizophrenia (HCC) -Continue  Zyprexa  Obesity Estimated body mass index is 28.19 kg/m as calculated from the following:   Height as of this encounter: 5\' 7"  (1.702 m).   Weight as of this encounter: 81.6 kg.  Code Status: Full code DVT Prophylaxis:  SCDs Start: 09/09/22 2254   Level of Care: Level of care: Progressive Family Communication: Updated patient Disposition Plan:      Remains inpatient appropriate:      Procedures:    Consultants:   Vascular surgery  Antimicrobials:      Medications  aspirin EC  81 mg Oral Daily   atorvastatin  40 mg Oral QHS   docusate sodium  100 mg Oral BID   empagliflozin  10 mg Oral Daily   ferrous sulfate  325 mg Oral Q breakfast   furosemide  40 mg Oral Daily   hydrALAZINE  50 mg Oral BID   isosorbide mononitrate  60 mg Oral Daily   lidocaine  1 patch Transdermal Q24H   OLANZapine  10 mg Oral QHS   sodium chloride flush  3 mL Intravenous Q12H      Subjective:   Erin Riley was seen and examined today.  BP still elevated, denies any chest pain, shortness of breath, fevers.  Back pain controlled.  Objective:   Vitals:   09/10/22 0400 09/10/22 0437 09/10/22 0833 09/10/22 1001  BP: 128/76 (!) 136/92 (!) 172/106 (!) 152/82  Pulse: (!) 51 63 63   Resp: 12 16 18    Temp:  97.9 F (36.6 C) 97.9 F (36.6 C)   TempSrc:  Oral Oral   SpO2: 95% 100% 100%   Weight:      Height:        Intake/Output Summary (Last 24 hours) at 09/10/2022 1003 Last data filed at 09/10/2022 1610 Gross per 24 hour  Intake 480 ml  Output --  Net 480 ml     Wt Readings from Last 3 Encounters:  09/09/22 81.6 kg  07/19/22 102 kg  06/06/22 101.8 kg     Exam General: Alert and oriented x 3, NAD Cardiovascular: S1 S2 auscultated,  RRR Respiratory: Clear to auscultation bilaterally, no wheezing Gastrointestinal: Obese, soft, nontender, nondistended, + bowel sounds Ext: no pedal edema bilaterally Neuro: Strength 5/5 upper and lower extremities bilaterally    Data Reviewed:   I have personally reviewed following labs    CBC Lab Results  Component Value Date   WBC 4.8 09/10/2022   RBC 3.92 09/10/2022   HGB 11.8 (L) 09/10/2022   HCT 37.1 09/10/2022   MCV 94.6 09/10/2022   MCH 30.1 09/10/2022   PLT 241 09/10/2022   MCHC 31.8 09/10/2022   RDW 13.6 09/10/2022   LYMPHSABS 1.8 09/09/2022   MONOABS 0.4 09/09/2022   EOSABS 0.2 09/09/2022   BASOSABS 0.0 09/09/2022     Last metabolic panel Lab Results  Component Value Date  NA 136 09/10/2022   K 4.3 09/10/2022   CL 105 09/10/2022   CO2 25 09/10/2022   BUN 19 09/10/2022   CREATININE 1.40 (H) 09/10/2022   GLUCOSE 101 (H) 09/10/2022   GFRNONAA 40 (L) 09/10/2022   GFRAA 42 (L) 12/09/2019   CALCIUM 9.0 09/10/2022   PHOS 3.0 05/19/2020   PROT 6.7 09/10/2022   ALBUMIN 3.1 (L) 09/10/2022   LABGLOB 3.4 02/15/2021   AGRATIO 1.1 (L) 02/15/2021   BILITOT 0.8 09/10/2022   ALKPHOS 68 09/10/2022   AST 16 09/10/2022   ALT 16 09/10/2022   ANIONGAP 6 09/10/2022    CBG (last 3)  No results for input(s): "GLUCAP" in the last 72 hours.    Coagulation Profile: No results for input(s): "INR", "PROTIME" in the last 168 hours.   Radiology Studies: I have personally reviewed the imaging studies  CT Angio Chest/Abd/Pel for Dissection W and/or Wo Contrast  Result Date: 09/09/2022 CLINICAL DATA:  Acute aortic syndrome suspected EXAM: CT ANGIOGRAPHY CHEST, ABDOMEN AND PELVIS TECHNIQUE: Non-contrast CT of the chest was initially obtained. Multidetector CT imaging through the chest, abdomen and pelvis was performed using the standard protocol during bolus administration of intravenous contrast. Multiplanar reconstructed images and MIPs were obtained and reviewed to evaluate the vascular anatomy. RADIATION DOSE REDUCTION: This exam was performed according to the departmental dose-optimization program which includes automated exposure control, adjustment of the mA and/or kV according to patient size and/or use of iterative  reconstruction technique. CONTRAST:  75mL OMNIPAQUE IOHEXOL 350 MG/ML SOLN COMPARISON:  07/19/2022, 01/17/2019 FINDINGS: CTA CHEST FINDINGS VASCULAR Aorta: Satisfactory opacification of the aorta. Unchanged contour and caliber of the thoracic aorta, tubular ascending thoracic aorta measuring up to 3.9 x 3.8 cm. Unchanged enlargement of the descending thoracic aorta, notable for a thrombosed focal aneurysm or nonacute penetrating at the left aspect of the mid descending vessel, maximum caliber of the vessel at this level 5.5 x 4.1 cm (series 7, image 76). 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 Severe mixed calcific atherosclerosis Cardiovascular: No evidence of pulmonary embolism on limited non-tailored examination. Cardiomegaly. No pericardial effusion. Review of the MIP images confirms the above findings. NON VASCULAR Mediastinum/Nodes: No enlarged mediastinal, hilar, or axillary lymph nodes. Thyroid gland, trachea, and esophagus demonstrate no significant findings. Lungs/Pleura: Lungs are clear. No pleural effusion or pneumothorax. Musculoskeletal: No chest wall abnormality. No acute osseous findings. Review of the MIP images confirms the above findings. CTA ABDOMEN AND PELVIS FINDINGS VASCULAR Unchanged contour and caliber of the abdominal aorta. Severe mixed atherosclerosis with fusiform aneurysm of the upper abdominal aorta, contiguous with aneurysmal dilatation described above, without aneurysm of the infrarenal vessel. No evidence of dissection or other acute aortic pathology. Standard branching pattern of the abdominal aorta with solitary bilateral renal arteries. Review of the MIP images confirms the above findings. NON-VASCULAR Hepatobiliary: No solid liver abnormality is seen. No gallstones, gallbladder wall thickening, or biliary dilatation. Pancreas: Unremarkable. No  pancreatic ductal dilatation or surrounding inflammatory changes. Spleen: Normal in size without significant abnormality. Adrenals/Urinary Tract: Adrenal glands are unremarkable. Kidneys are normal, without renal calculi, solid lesion, or hydronephrosis. Bladder is unremarkable. Stomach/Bowel: Stomach is within normal limits. Appendix appears normal. No evidence of bowel wall thickening, distention, or inflammatory changes. Descending and sigmoid diverticulosis. Lymphatic: No enlarged abdominal or pelvic lymph nodes. Reproductive: No mass. Similar loculated fluid or cystic lesion of  the right ovary or adnexa measuring 4.8 x 2.5 cm, stable over numerous prior examinations dating back to at least 2020 and presumed benign (series 7, image 263) Other: No abdominal wall hernia or abnormality. No ascites. Musculoskeletal: No acute osseous findings. IMPRESSION: 1. Unchanged enlargement of the descending thoracic aorta, notable for a thrombosed focal aneurysm or nonacute penetrating ulceration at the left aspect of the mid descending vessel, maximum caliber of the vessel at this level 5.5 x 4.1 cm. 2. 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 3. 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 4. No evidence of dissection or other acute aortic pathology. 5. Severe mixed atherosclerosis throughout. 6. No acute findings. Aortic Atherosclerosis (ICD10-I70.0). Electronically Signed   By: Jearld Lesch M.D.   On: 09/09/2022 17:42       Sipriano Fendley M.D. [facility] 09/10/2022, 10:03 AM  Available via Epic secure chat 7am-7pm After 7 pm, please refer to night coverage provider listed on amion.

## 2022-09-10 NOTE — Consult Note (Signed)
Hospital Consult    Reason for Consult: Thoracoabdominal aneurysm Referring Physician:  Dr. Isidoro Donning MRN #:  [id number]  History of Present Illness: This is a 72 y.o. female with a known history of abdominal aortic aneurysm previously referred to our office but has not followed up.  She is now here with hypertensive crisis initial systolic blood pressure of 229 back pain that she states is now resolved.  Interview with the patient was difficult she demonstrates confusion to most of her surroundings but is able to tell me that she did not know she had an aneurysm and that she lives in a group home.  She is not sure which medication she takes but does not think she takes a blood thinner.  She denies any recent drug use.  She tells me that she had incision for cesarean section in the past but no other previous surgeries.  She walks without limitation.  Past Medical History:  Diagnosis Date   Chronic back pain    Chronic kidney disease (CKD), stage II (mild)    Class I-II   Coronary artery disease 04/2009   50% stenosis in the perforator of LAD; catheterization was for an abnormal Myoview in January 2000 showing anterior and inferolateral ischemia.   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 > 3 subsequent Echos show improved/normal EF with moderate to severe concentric LVH and diastolic dysfunction with LVOT/intracavitary gradient --> 06/2016: Severe LVH.  Vigorous EF, 65-70%.?? Gr 1 DD. Mild AS.   Hyperlipidemia    Hypertension    Hypertensive hypertrophic cardiomyopathy: NYHA class II:  Echo: Severe concentric LVH with LV OT gradient; essentially preserved EF with diastolic dysfunction 02/15/2013   Echo 06/2016: Severe Concentric LVH. Vigorous EF 65-70%. ~ Gr I DD.    Mild aortic stenosis by prior echocardiography    Echo 06/2016: Mild AS (Mean Gradient 15 mmHg); has had prior Mod-Severe  MR (not seen on current echo)   PAD (peripheral artery disease) (HCC) March 2013   Lower extremity Dopplers: R. SFA 50-60%, R. PTA proximally occluded with distal reconstitution;; L. common iliac ~50%, L. SFA 50-70% stenosis, L. PTA < 50%   Schizophrenia (HCC)     Past Surgical History:  Procedure Laterality Date   BUNIONECTOMY     carotid doppler  05/29/2011   left bulb/prox ICA moderate amtfibrous plaque with no evidence significant reduction.,right bulb /proximal ICA normal patency   lower extremity doppler  05/29/2011   right SFA 50% to 59% diameter reduction,right posterior tibal atreery occlusive disease,reconstituting distally, left common illiac<50%,left SFA 50 to70%,left post. tibial <50%   NM MYOCAR PERF WALL MOTION  03/2009   Persantine; EF 51%-both anterior and inferolateral ischemia   TRANSTHORACIC ECHOCARDIOGRAM  06/2016   Severe LVH.  Vigorous EF of 65-70%.  No RWMA. ~Only grade 1 diastolic dysfunction.  Mild aortic stenosis (mean gradient 15 mmHg)   TRANSTHORACIC ECHOCARDIOGRAM  07/2012   EF 50-55%; severe concentric LVH; only grade 1 diastolic dysfunction. Mild aortic sclerosis - with LVOT /intracavitary gradient of roughly 20 mmHg mean. Mild to moderately dilated LA;; previously reported MR not seen    No Known Allergies  Prior to Admission medications   Medication Sig Start Date End Date Taking? Authorizing Provider  atorvastatin (LIPITOR) 40 MG tablet Take 1 tablet (40 mg total) by mouth at bedtime. Patient taking differently: Take 40 mg by mouth daily. 06/14/22  Yes Sarina Ill, DO  carvedilol (COREG) 25 MG tablet Take 1 tablet (25 mg total) by mouth 2 (two) times daily with a meal. 06/14/22  Yes Sarina Ill, DO  furosemide (LASIX) 40 MG tablet Take 1 tablet (40 mg total) by mouth daily. 06/14/22  Yes Sarina Ill, DO  isosorbide mononitrate (IMDUR) 60 MG 24 hr tablet Take 1 tablet (60 mg total) by mouth daily. 06/15/22  Yes Sarina Ill, DO  potassium chloride (KLOR-CON M) 10 MEQ tablet Take 1 tablet (10 mEq total) by mouth daily. 06/15/22  Yes Sarina Ill, DO  aspirin EC 81 MG EC tablet Take 1 tablet (81 mg total) by mouth daily. Swallow whole. Patient not taking: Reported on 09/10/2022 05/21/20   Glenford Bayley, MD  ferrous sulfate 325 (65 FE) MG EC tablet Take 325 mg by mouth daily with breakfast. Patient not taking: Reported on 09/10/2022    [provider]  hydrALAZINE (APRESOLINE) 50 MG tablet Take 1 tablet (50 mg total) by mouth 2 (two) times daily. 06/14/22   Sarina Ill, DO  OLANZapine (ZYPREXA) 5 MG tablet Take 2 tablets (10 mg total) by mouth at bedtime. 06/14/22 06/14/23  Sarina Ill, DO    Social History   Socioeconomic History   Marital status: Single    Spouse name: Not on file   Number of children: 7   Years of education: Not on file   Highest education level: Not on file  Occupational History   Not on file  Tobacco Use   Smoking status: Some Days    Types: Cigarettes    Last attempt to quit: 05/14/2002    Years since quitting: 20.3   Smokeless tobacco: Never  Vaping Use   Vaping Use: Never used  Substance and Sexual Activity   Alcohol use: No   Drug use: No   Sexual activity: Yes    Birth control/protection: Post-menopausal, None  Other Topics Concern   Not on file  Social History Narrative   Now single mother of 2 with one grandchild. She quit smoking roughly 5 years ago and is not so since. She has also stopped drinking alcohol. She does try get routine exercise walking at least a mile 3-4 days a week.    She lives with her 64 year old mother. 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   Social Determinants of Health   Financial Resource Strain: Not on file  Food Insecurity: No Food Insecurity (06/06/2022)   Hunger Vital Sign    Worried About Running Out of Food in the Last Year: Never true    Ran Out of Food in the Last Year: Never true   Transportation Needs: No Transportation Needs (06/06/2022)   PRAPARE - Administrator, Civil Service (Medical): No    Lack of Transportation (Non-Medical): No  Physical Activity: Not on file  Stress: Not on file  Social Connections: Not on file  Intimate Partner Violence: Not At Risk (06/06/2022)   Humiliation, Afraid, Rape, and Kick questionnaire    Fear of Current or Ex-Partner: No    Emotionally Abused: No    Physically Abused: No    Sexually Abused: No     Family History  Problem Relation Age of Onset   Hypertension Mother    Breast cancer Neg Hx     Review of Systems  Constitutional: Negative.   HENT: Negative.    Respiratory: Negative.    Cardiovascular: Negative.   Gastrointestinal: Negative.  Musculoskeletal:  Positive for back pain.  Skin: Negative.   Neurological: Negative.   Endo/Heme/Allergies: Negative.   Psychiatric/Behavioral: Negative.        Physical Examination  Vitals:   09/10/22 0833 09/10/22 1001  BP: (!) 172/106 (!) 152/82  Pulse: 63   Resp: 18   Temp: 97.9 F (36.6 C)   SpO2: 100%    Body mass index is 28.19 kg/m.  Physical Exam HENT:     Head: Normocephalic.  Eyes:     Pupils: Pupils are equal, round, and reactive to light.  Cardiovascular:     Rate and Rhythm: Normal rate.  Pulmonary:     Effort: Pulmonary effort is normal.  Abdominal:     General: Abdomen is flat.     Palpations: Abdomen is soft.  Musculoskeletal:     Right lower leg: No edema.     Left lower leg: No edema.  Neurological:     General: No focal deficit present.     Mental Status: She is alert. She is disoriented.  Psychiatric:     Comments: Not exhibiting normal thought content or behavior         Component Value Date/Time   WBC 4.8 09/10/2022 0032   RBC 3.92 09/10/2022 0032   HGB 11.8 (L) 09/10/2022 0032   HGB 10.4 (L) 02/15/2021 1048   HCT 37.1 09/10/2022 0032   HCT 32.5 (L) 02/15/2021 1048   PLT 241 09/10/2022 0032   PLT 360  02/15/2021 1048   MCV 94.6 09/10/2022 0032   MCV 89 02/15/2021 1048   MCH 30.1 09/10/2022 0032   MCHC 31.8 09/10/2022 0032   RDW 13.6 09/10/2022 0032   RDW 12.7 02/15/2021 1048   LYMPHSABS 1.8 09/09/2022 1505   LYMPHSABS 1.8 02/15/2021 1048   MONOABS 0.4 09/09/2022 1505   EOSABS 0.2 09/09/2022 1505   EOSABS 0.3 02/15/2021 1048   BASOSABS 0.0 09/09/2022 1505   BASOSABS 0.0 02/15/2021 1048    BMET    Component Value Date/Time   NA 136 09/10/2022 0032   NA 140 02/15/2021 1048   K 4.3 09/10/2022 0032   CL 105 09/10/2022 0032   CO2 25 09/10/2022 0032   GLUCOSE 101 (H) 09/10/2022 0032   BUN 19 09/10/2022 0032   BUN 16 02/15/2021 1048   CREATININE 1.40 (H) 09/10/2022 0032   CREATININE 1.21 (H) 06/15/2014 1025   CALCIUM 9.0 09/10/2022 0032   GFRNONAA 40 (L) 09/10/2022 0032   GFRAA 42 (L) 12/09/2019 0031    COAGS: Lab Results  Component Value Date   INR 1.07 09/16/2014   INR 1.10 04/09/2009     Non-Invasive Vascular Imaging:   CTA IMPRESSION: 1. Unchanged enlargement of the descending thoracic aorta, notable for a thrombosed focal aneurysm or nonacute penetrating ulceration at the left aspect of the mid descending vessel, maximum caliber of the vessel at this level 5.5 x 4.1 cm. 2. 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 3. 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 4. No evidence of dissection or other acute aortic pathology. 5. Severe mixed atherosclerosis throughout. 6. No acute findings.   ASSESSMENT/PLAN: This is a 72 y.o. 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  This is really unchanged from CT scan performed in May.  She needs treatment for hypertensive crisis  and underlying schizophrenia.   Will continue to follow for consideration of elective repair of large thoracoabdominal aneurysm.  Lenaya Pietsch C. Randie Heinz, MD [facility] Office: [phone] Pager: [phone]

## 2022-09-10 NOTE — ED Notes (Signed)
ED TO INPATIENT HANDOFF REPORT  ED Nurse Name and Phone #: Leavy Cella [phone]  S Name/Age/Gender Erin Riley 72 y.o. female Room/Bed: 012C/012C  Code Status   Code Status: Full Code  Home/SNF/Other Group home Patient oriented to: self, place, time, situation Is this baseline? Yes   Triage Complete: Triage complete  Chief Complaint Hypertensive crisis [I16.9]  Triage Note Pt bib [facility] from home with complaints of left lower back pain that stated 3 days ago and is now radiating into her mid back. Denies urinary sx, falls, other injuries. AOx4, Ambulatory. Resp e/u   Allergies No Known Allergies  Level of Care/Admitting Diagnosis ED Disposition     ED Disposition  Admit   Condition  --   Comment  Hospital Area: [facility] [[id number]]  Level of Care: Progressive [102]  Admit to Progressive based on following criteria: CARDIOVASCULAR & THORACIC of moderate stability with acute coronary syndrome symptoms/low risk myocardial infarction/hypertensive urgency/arrhythmias/heart failure potentially compromising stability and stable post cardiovascular intervention patients.  May admit patient to [name] or [facility] if equivalent level of care is available:: No  Covid Evaluation: Asymptomatic - no recent exposure (last 10 days) testing not required  Diagnosis: Hypertensive crisis [454098]  Admitting Physician: Tereasa Coop [[id number]]  Attending Physician: Tereasa Coop [[id number]]  Certification:: I certify this patient will need inpatient services for at least 2 midnights  Estimated Length of Stay: 4          B Medical/Surgery History Past Medical History:  Diagnosis Date   Chronic back pain    Chronic kidney disease (CKD), stage II (mild)    Class I-II   Coronary artery disease 04/2009   50% stenosis in the perforator of LAD; catheterization was for an abnormal Myoview in January 2000 showing anterior and inferolateral ischemia.   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 > 3 subsequent Echos show improved/normal EF with moderate to severe concentric LVH and diastolic dysfunction with LVOT/intracavitary gradient --> 06/2016: Severe LVH.  Vigorous EF, 65-70%.?? Gr 1 DD. Mild AS.   Hyperlipidemia    Hypertension    Hypertensive hypertrophic cardiomyopathy: NYHA class II:  Echo: Severe concentric LVH with LV OT gradient; essentially preserved EF with diastolic dysfunction 02/15/2013   Echo 06/2016: Severe Concentric LVH. Vigorous EF 65-70%. ~ Gr I DD.    Mild aortic stenosis by prior echocardiography    Echo 06/2016: Mild AS (Mean Gradient 15 mmHg); has had prior Mod-Severe MR (not seen on current echo)   PAD (peripheral artery disease) (HCC) March 2013   Lower extremity Dopplers: R. SFA 50-60%, R. PTA proximally occluded with distal reconstitution;; L. common iliac ~50%, L. SFA 50-70% stenosis, L. PTA < 50%   Schizophrenia (HCC)    Past Surgical History:  Procedure Laterality Date   BUNIONECTOMY     carotid doppler  05/29/2011   left bulb/prox ICA moderate amtfibrous plaque with no evidence significant reduction.,right bulb /proximal ICA normal patency   lower extremity doppler  05/29/2011   right SFA 50% to 59% diameter reduction,right posterior tibal atreery occlusive disease,reconstituting distally, left common illiac<50%,left SFA 50 to70%,left post. tibial <50%   NM MYOCAR PERF WALL MOTION  03/2009   Persantine; EF 51%-both anterior and inferolateral ischemia   TRANSTHORACIC ECHOCARDIOGRAM  06/2016   Severe LVH.  Vigorous EF of 65-70%.  No RWMA. ~Only grade 1 diastolic dysfunction.  Mild aortic stenosis (mean gradient 15 mmHg)   TRANSTHORACIC ECHOCARDIOGRAM  07/2012   EF 50-55%; severe concentric LVH; only grade 1 diastolic dysfunction. Mild aortic sclerosis - with LVOT /intracavitary gradient of roughly 20 mmHg mean. Mild  to moderately dilated LA;; previously reported MR not seen     A IV Location/Drains/Wounds Patient Lines/Drains/Airways Status     Active Line/Drains/Airways     Name Placement date Placement time Site Days   Peripheral IV 09/09/22 20 G Left Antecubital 09/09/22  --  Antecubital  1            Intake/Output Last 24 hours  Intake/Output Summary (Last 24 hours) at 09/10/2022 1115 Last data filed at 09/10/2022 9604 Gross per 24 hour  Intake 480 ml  Output --  Net 480 ml    Labs/Imaging Results for orders placed or performed during the hospital encounter of 09/09/22 (from the past 48 hour(s))  Urinalysis, Routine w reflex microscopic -Urine, Clean Catch     Status: Abnormal   Collection Time: 09/09/22  2:49 PM  Result Value Ref Range   Color, Urine YELLOW YELLOW   APPearance HAZY (A) CLEAR   Specific Gravity, Urine 1.020 1.005 - 1.030   pH 5.0 5.0 - 8.0   Glucose, UA NEGATIVE NEGATIVE mg/dL   Hgb urine dipstick NEGATIVE NEGATIVE   Bilirubin Urine NEGATIVE NEGATIVE   Ketones, ur NEGATIVE NEGATIVE mg/dL   Protein, ur 540 (A) NEGATIVE mg/dL   Nitrite NEGATIVE NEGATIVE   Leukocytes,Ua NEGATIVE NEGATIVE   RBC / HPF 0-5 0 - 5 RBC/hpf   WBC, UA 0-5 0 - 5 WBC/hpf   Bacteria, UA RARE (A) NONE SEEN   Squamous Epithelial / HPF 6-10 0 - 5 /HPF   Mucus PRESENT     Comment: Performed at [facility], [address], [place], [place] [address]  Urine rapid drug screen (hosp performed)     Status: None   Collection Time: 09/09/22  2:49 PM  Result Value Ref Range   Opiates NONE DETECTED NONE DETECTED   Cocaine NONE DETECTED NONE DETECTED   Benzodiazepines NONE DETECTED NONE DETECTED   Amphetamines NONE DETECTED NONE DETECTED   Tetrahydrocannabinol NONE DETECTED NONE DETECTED   Barbiturates NONE DETECTED NONE DETECTED    Comment: (NOTE) DRUG SCREEN FOR MEDICAL PURPOSES ONLY.  IF CONFIRMATION IS NEEDED FOR ANY PURPOSE, NOTIFY LAB WITHIN 5 DAYS.  LOWEST DETECTABLE  LIMITS FOR URINE DRUG SCREEN Drug Class                     Cutoff (ng/mL) Amphetamine and metabolites    1000 Barbiturate and metabolites    200 Benzodiazepine                 200 Opiates and metabolites        300 Cocaine and metabolites        300 THC                            50 Performed at [facility], [address] [place], [place] [address]   CBC with Differential     Status: None   Collection Time: 09/09/22  3:05 PM  Result Value Ref Range   WBC 5.0 4.0 - 10.5 K/uL   RBC 4.35 3.87 - 5.11 MIL/uL   Hemoglobin 13.1 12.0 - 15.0 g/dL   HCT 95.6 21.3 - 08.6 %   MCV 94.9  80.0 - 100.0 fL   MCH 30.1 26.0 - 34.0 pg   MCHC 31.7 30.0 - 36.0 g/dL   RDW 81.1 91.4 - 78.2 %   Platelets 265 150 - 400 K/uL   nRBC 0.0 0.0 - 0.2 %   Neutrophils Relative % 54 %   Neutro Abs 2.7 1.7 - 7.7 K/uL   Lymphocytes Relative 35 %   Lymphs Abs 1.8 0.7 - 4.0 K/uL   Monocytes Relative 8 %   Monocytes Absolute 0.4 0.1 - 1.0 K/uL   Eosinophils Relative 3 %   Eosinophils Absolute 0.2 0.0 - 0.5 K/uL   Basophils Relative 0 %   Basophils Absolute 0.0 0.0 - 0.1 K/uL   Immature Granulocytes 0 %   Abs Immature Granulocytes 0.01 0.00 - 0.07 K/uL    Comment: Performed at [facility], [address] [place], [place] [address]  Basic metabolic panel     Status: Abnormal   Collection Time: 09/09/22  3:05 PM  Result Value Ref Range   Sodium 138 135 - 145 mmol/L   Potassium 4.6 3.5 - 5.1 mmol/L   Chloride 104 98 - 111 mmol/L   CO2 25 22 - 32 mmol/L   Glucose, Bld 129 (H) 70 - 99 mg/dL    Comment: Glucose reference range applies only to samples taken after fasting for at least 8 hours.   BUN 22 8 - 23 mg/dL   Creatinine, Ser 3.08 (H) 0.44 - 1.00 mg/dL   Calcium 9.6 8.9 - 65.7 mg/dL   GFR, Estimated 33 (L) >60 mL/min    Comment: (NOTE) Calculated using the CKD-EPI Creatinine Equation (2021)    Anion gap 9 5 - 15    Comment: Performed at [facility], [address],  [place], [place] [address]  TSH     Status: None   Collection Time: 09/09/22  3:05 PM  Result Value Ref Range   TSH 0.515 0.350 - 4.500 uIU/mL    Comment: Performed by a 3rd Generation assay with a functional sensitivity of <=0.01 uIU/mL. Performed at [facility], [address], [place], [place] [address]   I-stat chem 8, ED (not at [facility], [facility] or [facility])     Status: Abnormal   Collection Time: 09/09/22  3:18 PM  Result Value Ref Range   Sodium 139 135 - 145 mmol/L   Potassium 4.7 3.5 - 5.1 mmol/L   Chloride 107 98 - 111 mmol/L   BUN 26 (H) 8 - 23 mg/dL   Creatinine, Ser 4.13 (H) 0.44 - 1.00 mg/dL   Glucose, Bld 244 (H) 70 - 99 mg/dL    Comment: Glucose reference range applies only to samples taken after fasting for at least 8 hours.   Calcium, Ion 1.26 1.15 - 1.40 mmol/L   TCO2 25 22 - 32 mmol/L   Hemoglobin 14.3 12.0 - 15.0 g/dL   HCT 01.0 27.2 - 53.6 %  Ethanol     Status: None   Collection Time: 09/09/22  7:00 PM  Result Value Ref Range   Alcohol, Ethyl (B) <10 <10 mg/dL    Comment: (NOTE) Lowest detectable limit for serum alcohol is 10 mg/dL.  For medical purposes only. Performed at [facility], [address] [place], [place] [address]   Comprehensive metabolic panel     Status: Abnormal   Collection Time: 09/10/22 12:32 AM  Result Value Ref Range   Sodium 136 135 - 145 mmol/L   Potassium  4.3 3.5 - 5.1 mmol/L   Chloride 105 98 - 111 mmol/L   CO2 25 22 - 32 mmol/L   Glucose, Bld 101 (H) 70 - 99 mg/dL    Comment: Glucose reference range applies only to samples taken after fasting for at least 8 hours.   BUN 19 8 - 23 mg/dL   Creatinine, Ser 4.09 (H) 0.44 - 1.00 mg/dL   Calcium 9.0 8.9 - 81.1 mg/dL   Total Protein 6.7 6.5 - 8.1 g/dL   Albumin 3.1 (L) 3.5 - 5.0 g/dL   AST 16 15 - 41 U/L   ALT 16 0 - 44 U/L   Alkaline Phosphatase 68 38 - 126 U/L   Total Bilirubin 0.8 0.3 - 1.2 mg/dL   GFR, Estimated 40 (L) >60 mL/min    Comment: (NOTE) Calculated using the  CKD-EPI Creatinine Equation (2021)    Anion gap 6 5 - 15    Comment: Performed at [facility], [address] [place], [place] [address]  CBC     Status: Abnormal   Collection Time: 09/10/22 12:32 AM  Result Value Ref Range   WBC 4.8 4.0 - 10.5 K/uL   RBC 3.92 3.87 - 5.11 MIL/uL   Hemoglobin 11.8 (L) 12.0 - 15.0 g/dL   HCT 29.5 62.1 - 30.8 %   MCV 94.6 80.0 - 100.0 fL   MCH 30.1 26.0 - 34.0 pg   MCHC 31.8 30.0 - 36.0 g/dL   RDW 65.7 84.6 - 96.2 %   Platelets 241 150 - 400 K/uL   nRBC 0.0 0.0 - 0.2 %    Comment: Performed at [facility], [address] [place], [place] [address]   CT Angio Chest/Abd/Pel for Dissection W and/or Wo Contrast  Result Date: 09/09/2022 CLINICAL DATA:  Acute aortic syndrome suspected EXAM: CT ANGIOGRAPHY CHEST, ABDOMEN AND PELVIS TECHNIQUE: Non-contrast CT of the chest was initially obtained. Multidetector CT imaging through the chest, abdomen and pelvis was performed using the standard protocol during bolus administration of intravenous contrast. Multiplanar reconstructed images and MIPs were obtained and reviewed to evaluate the vascular anatomy. RADIATION DOSE REDUCTION: This exam was performed according to the departmental dose-optimization program which includes automated exposure control, adjustment of the mA and/or kV according to patient size and/or use of iterative reconstruction technique. CONTRAST:  75mL OMNIPAQUE IOHEXOL 350 MG/ML SOLN COMPARISON:  07/19/2022, 01/17/2019 FINDINGS: CTA CHEST FINDINGS VASCULAR Aorta: Satisfactory opacification of the aorta. Unchanged contour and caliber of the thoracic aorta, tubular ascending thoracic aorta measuring up to 3.9 x 3.8 cm. Unchanged enlargement of the descending thoracic aorta, notable for a thrombosed focal aneurysm or nonacute penetrating at the left aspect of the mid descending vessel, maximum caliber of the vessel at this level 5.5 x 4.1 cm (series 7, image 76). Additional unchanged chronic,  thrombosed focal aneurysm or penetrating ulceration of the level of the diaphragmatic hiatus, measuring up to 5.1 x 4.2 cm (series 7, image 136). Severe mixed calcific atherosclerosis Cardiovascular: No evidence of pulmonary embolism on limited non-tailored examination. Cardiomegaly. No pericardial effusion. Review of the MIP images confirms the above findings. NON VASCULAR Mediastinum/Nodes: No enlarged mediastinal, hilar, or axillary lymph nodes. Thyroid gland, trachea, and esophagus demonstrate no significant findings. Lungs/Pleura: Lungs are clear. No pleural effusion or pneumothorax. Musculoskeletal: No chest wall abnormality. No acute osseous findings. Review of the MIP images confirms the above findings. CTA ABDOMEN AND PELVIS FINDINGS VASCULAR Unchanged contour and caliber of the abdominal aorta. 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 No evidence of dissection or other acute aortic pathology. Standard branching pattern of the abdominal aorta with solitary bilateral renal arteries. Review of the MIP images confirms the above findings. NON-VASCULAR Hepatobiliary: No solid liver abnormality is seen. No gallstones, gallbladder wall thickening, or biliary dilatation. Pancreas: Unremarkable. No pancreatic ductal dilatation or surrounding inflammatory changes. Spleen: Normal in size without significant abnormality. Adrenals/Urinary Tract: Adrenal glands are unremarkable. Kidneys are normal, without renal calculi, solid lesion, or hydronephrosis. Bladder is unremarkable. Stomach/Bowel: Stomach is within normal limits. Appendix appears normal. No evidence of bowel wall thickening, distention, or inflammatory changes. Descending and sigmoid diverticulosis. Lymphatic: No enlarged abdominal or pelvic lymph nodes. Reproductive: No mass. Similar loculated fluid or cystic lesion of  the right ovary or adnexa measuring 4.8 x 2.5 cm, stable over numerous prior examinations dating back to at least 2020 and presumed benign (series 7, image 263) Other: No abdominal wall hernia or abnormality. No ascites. Musculoskeletal: No acute osseous findings. IMPRESSION: 1. Unchanged enlargement of the descending thoracic aorta, notable for a thrombosed focal aneurysm or nonacute penetrating ulceration at the left aspect of the mid descending vessel, maximum caliber of the vessel at this level 5.5 x 4.1 cm. 2. 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 3. 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 4. No evidence of dissection or other acute aortic pathology. 5. Severe mixed atherosclerosis throughout. 6. No acute findings. Aortic Atherosclerosis (ICD10-I70.0). Electronically Signed   By: Jearld Lesch M.D.   On: 09/09/2022 17:42    Pending Labs Unresulted Labs (From admission, onward)     Start     Ordered   09/09/22 2257  Rapid urine drug screen (hospital performed)  ONCE - STAT,   STAT        09/09/22 2256            Vitals/Pain Today's Vitals   09/10/22 0437 09/10/22 0735 09/10/22 0833 09/10/22 1001  BP: (!) 136/92  (!) 172/106 (!) 152/82  Pulse: 63  63   Resp: 16  18   Temp: 97.9 F (36.6 C)  97.9 F (36.6 C)   TempSrc: Oral  Oral   SpO2: 100%  100%   Weight:      Height:      PainSc:  Asleep      Isolation Precautions No active isolations  Medications Medications  lidocaine (LIDODERM) 5 % 1 patch (1 patch Transdermal Patch Removed 09/10/22 0827)  OLANZapine (ZYPREXA) tablet 10 mg (10 mg Oral Given 09/10/22 0049)  isosorbide mononitrate (IMDUR) 24 hr tablet 60 mg (60 mg Oral Given 09/10/22 1001)  aspirin EC tablet 81 mg (81 mg Oral Given 09/10/22 1001)  atorvastatin  (LIPITOR) tablet 40 mg (40 mg Oral Given 09/10/22 0049)  furosemide (LASIX) tablet 40 mg (40 mg Oral Given 09/10/22 1000)  ferrous sulfate tablet 325 mg (325 mg Oral Given 09/10/22 0841)  sodium chloride flush (NS) 0.9 % injection 3 mL (3 mLs Intravenous Given 09/10/22 1006)  sodium chloride flush (NS) 0.9 % injection 3 mL (has no administration in time range)  0.9 %  sodium chloride infusion (has no administration in time range)  acetaminophen (TYLENOL) tablet 650 mg (has no administration in time range)  Or  acetaminophen (TYLENOL) suppository 650 mg (has no administration in time range)  morphine (PF) 2 MG/ML injection 2 mg (2 mg Intravenous Given 09/10/22 1009)  docusate sodium (COLACE) capsule 100 mg (100 mg Oral Given 09/10/22 1001)  ondansetron (ZOFRAN) tablet 4 mg (has no administration in time range)    Or  ondansetron (ZOFRAN) injection 4 mg (has no administration in time range)  hydrALAZINE (APRESOLINE) injection 10 mg (has no administration in time range)  empagliflozin (JARDIANCE) tablet 10 mg (10 mg Oral Given 09/10/22 1001)  hydrALAZINE (APRESOLINE) tablet 50 mg (50 mg Oral Not Given 09/10/22 1013)  oxyCODONE-acetaminophen (PERCOCET/ROXICET) 5-325 MG per tablet 1 tablet (1 tablet Oral Given 09/09/22 1506)  iohexol (OMNIPAQUE) 350 MG/ML injection 75 mL (75 mLs Intravenous Contrast Given 09/09/22 1724)  carvedilol (COREG) tablet 25 mg (25 mg Oral Given 09/09/22 1934)  hydrALAZINE (APRESOLINE) tablet 50 mg (50 mg Oral Given 09/09/22 1934)  HYDROmorphone (DILAUDID) injection 0.5 mg (0.5 mg Intravenous Given 09/09/22 1934)  labetalol (NORMODYNE) injection 5 mg (5 mg Intravenous Given 09/09/22 2111)    Mobility walks     Focused Assessments Cardiac Assessment Handoff:  Cardiac Rhythm: Normal sinus rhythm Lab Results  Component Value Date   CKTOTAL 248 (H) 08/15/2010   CKMB 3.0 08/15/2010   TROPONINI 0.03 (HH) 04/01/2018   No results found for: "DDIMER" Does the Patient currently have chest  pain? No    R Recommendations: See Admitting Provider Note  Report given to:   Additional Notes: see admission orders

## 2022-09-11 ENCOUNTER — Inpatient Hospital Stay (HOSPITAL_COMMUNITY): Payer: 59

## 2022-09-11 ENCOUNTER — Telehealth: Payer: Self-pay | Admitting: Vascular Surgery

## 2022-09-11 DIAGNOSIS — I169 Hypertensive crisis, unspecified: Secondary | ICD-10-CM | POA: Diagnosis not present

## 2022-09-11 DIAGNOSIS — F209 Schizophrenia, unspecified: Secondary | ICD-10-CM | POA: Diagnosis not present

## 2022-09-11 DIAGNOSIS — I5022 Chronic systolic (congestive) heart failure: Secondary | ICD-10-CM | POA: Diagnosis not present

## 2022-09-11 LAB — ECHOCARDIOGRAM COMPLETE
AR max vel: 0.57 cm2
AV Area VTI: 0.61 cm2
AV Area mean vel: 0.53 cm2
AV Mean grad: 37 mmHg
AV Peak grad: 133.2 mmHg
Ao pk vel: 5.77 m/s
Area-P 1/2: 3.53 cm2
Calc EF: 27.8 %
Height: 67 in
S' Lateral: 4.8 cm
Single Plane A2C EF: 29.6 %
Single Plane A4C EF: 27.7 %
Weight: 2880 oz

## 2022-09-11 MED ORDER — AMLODIPINE BESYLATE 10 MG PO TABS
10.0000 mg | ORAL_TABLET | Freq: Every day | ORAL | Status: DC
  Administered 2022-09-11 – 2022-09-14 (×4): 10 mg via ORAL
  Filled 2022-09-11 (×4): qty 1

## 2022-09-11 MED ORDER — CARVEDILOL 3.125 MG PO TABS: 3.1250 mg | ORAL_TABLET | Freq: Two times a day (BID) | ORAL | Status: DC

## 2022-09-11 MED ORDER — AMLODIPINE BESYLATE 10 MG PO TABS: 10.0000 mg | ORAL_TABLET | Freq: Every day | ORAL | Status: DC

## 2022-09-11 MED ORDER — CARVEDILOL 6.25 MG PO TABS
6.2500 mg | ORAL_TABLET | Freq: Two times a day (BID) | ORAL | Status: DC
  Administered 2022-09-11 – 2022-09-14 (×6): 6.25 mg via ORAL
  Filled 2022-09-11 (×7): qty 1

## 2022-09-11 NOTE — Progress Notes (Signed)
PROGRESS NOTE                                                                                                                                                                                                             Patient Demographics:    Erin Riley, is a 72 y.o. female, DOB - Apr 19, 1950, WUJ:[id number]  Outpatient Primary MD for the patient is Hoy Register, MD    LOS - 2  Admit date - 09/09/2022    Chief Complaint  Patient presents with   Back Pain       Brief Narrative (HPI from H&P)   72 year old female with HTN, hypertrophic cardiomyopathy, chronic systolic and diastolic CHF with EF 25 to 30%, G2 DD, PAD, CKD stage IIIa, hyperlipidemia, schizophrenia, AAA presented to ED from group home with left-sided lower back pain. Patient's symptoms started 3 days prior to admission, denied any fall. Reported back pain is nonradiating, no chest pain, palpitations, blurry vision, headache. Lives in a group home, noncompliant with her BP medications.  He was diagnosed with uncontrolled hypertension, delusions and admitted to the hospital.   Subjective:    Erin Riley today has, No headache, No chest pain, No abdominal pain - No Nausea, No new weakness tingling or numbness, no SOB   Assessment  & Plan :    Nonspecific chest pain/back pain in a patient with history of chronic descending thoracic aorta aneurysm, noncompliance with medications.  Symptoms due to hypertensive crisis. She admitted to the hospital, counseled on compliance, seen by vascular surgery, her aneurysm is chronic and for now medical management only per vascular surgery with outpatient follow-up with Dr. Randie Heinz postdischarge, blood pressure medications adjusted for better control, pain has resolved continue to monitor.  Hypertrophic cardiomyopathy.  Had sinus bradycardia upon admission.  Will try and better control her blood pressure, Coreg dose reduced to less than  half of home dose, monitor heart rate, stable TSH.  Blood pressure medications adjusted further on 09/11/2022.  CAD with chronic systolic heart failure EF 35%.  Currently compensated, continue aspirin, statin, low-dose beta-blocker, avoiding ACE/ARB due to underlying renal dysfunction, repeat echo pending.    CKD stage 3A. Baseline creatinine around 1.5.  Continue to monitor.  Had mild AKI which has resolved.  Schizophrenia.  Lives at a group home, question compliance with her home Zyprexa, home medication  continued, still frankly delusional with some hallucination, psych input.      Condition - Fair  Family Communication  :  None present  Code Status :  Full  Consults  :  VVS, Psych  PUD Prophylaxis :     Procedures  :     CT - 1. Unchanged enlargement of the descending thoracic aorta, notable for a thrombosed focal aneurysm or nonacute penetrating ulceration at the left aspect of the mid descending vessel, maximum caliber of the vessel at this level 5.5 x 4.1 cm. 2. 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 3. 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 4. No evidence of dissection or other acute aortic pathology. 5. Severe mixed atherosclerosis throughout. 6. No acute findings. Aortic Atherosclerosis (ICD10-I70.0).      Disposition Plan  :    Status is: Inpatient  DVT Prophylaxis  :    SCDs Start: 09/09/22 2254    Lab Results  Component Value Date   PLT 241 09/10/2022    Diet :  Diet Order             Diet Heart Room service appropriate? Yes; Fluid consistency: Thin  Diet effective now                    Inpatient Medications  Scheduled Meds:  aspirin EC  81 mg Oral Daily   atorvastatin  40 mg Oral QHS   carvedilol  6.25 mg Oral BID WC   docusate  sodium  100 mg Oral BID   empagliflozin  10 mg Oral Daily   ferrous sulfate  325 mg Oral Q breakfast   furosemide  40 mg Oral Daily   hydrALAZINE  50 mg Oral Q8H   isosorbide mononitrate  60 mg Oral Daily   lidocaine  1 patch Transdermal Q24H   OLANZapine  10 mg Oral QHS   Continuous Infusions: PRN Meds:.acetaminophen **OR** acetaminophen, hydrALAZINE, morphine injection, ondansetron **OR** ondansetron (ZOFRAN) IV  Antibiotics  :    Anti-infectives (From admission, onward)    None        Objective:   Vitals:   09/10/22 1600 09/10/22 1909 09/11/22 0200 09/11/22 0715  BP: (!) 164/94 (!) 175/90 (!) 174/93 (!) 123/108  Pulse: 72 69 78 65  Resp: (!) 21 (!) 21 14 16   Temp: 97.9 F (36.6 C)   (!) 97.5 F (36.4 C)  TempSrc: Oral   Oral  SpO2: 96% 94% 94% 95%  Weight:      Height:        Wt Readings from Last 3 Encounters:  09/09/22 81.6 kg  07/19/22 102 kg  06/06/22 101.8 kg     Intake/Output Summary (Last 24 hours) at 09/11/2022 0924 Last data filed at 09/11/2022 0834 Gross per 24 hour  Intake 600 ml  Output 0 ml  Net 600 ml     Physical Exam  Awake Alert, No new F.N deficits, Normal affect Erin Riley.AT,PERRAL Supple Neck, No JVD,   Symmetrical Chest wall movement, Good air movement bilaterally, CTAB RRR,No Gallops,Rubs or new Murmurs,  +ve B.Sounds, Abd Soft, No tenderness,   No Cyanosis, Clubbing or edema     Data Review:    Recent Labs  Lab 09/09/22 1505 09/09/22 1518 09/10/22 0032  WBC 5.0  --  4.8  HGB 13.1 14.3  11.8*  HCT 41.3 42.0 37.1  PLT 265  --  241  MCV 94.9  --  94.6  MCH 30.1  --  30.1  MCHC 31.7  --  31.8  RDW 13.7  --  13.6  LYMPHSABS 1.8  --   --   MONOABS 0.4  --   --   EOSABS 0.2  --   --   BASOSABS 0.0  --   --     Recent Labs  Lab 09/09/22 1505 09/09/22 1518 09/10/22 0032  NA 138 139 136  K 4.6 4.7 4.3  CL 104 107 105  CO2 25  --  25  ANIONGAP 9  --  6  GLUCOSE 129* 129* 101*  BUN 22 26* 19  CREATININE 1.67* 1.60*  1.40*  AST  --   --  16  ALT  --   --  16  ALKPHOS  --   --  68  BILITOT  --   --  0.8  ALBUMIN  --   --  3.1*  TSH 0.515  --   --   CALCIUM 9.6  --  9.0      Recent Labs  Lab 09/09/22 1505 09/10/22 0032  TSH 0.515  --   CALCIUM 9.6 9.0   Lab Results  Component Value Date   CHOL 147 03/27/2022   HDL 37 (L) 03/27/2022   LDLCALC 92 03/27/2022   TRIG 89 03/27/2022   CHOLHDL 4.0 03/27/2022    Lab Results  Component Value Date   HGBA1C 5.8 (H) 06/10/2022    Recent Labs    09/09/22 1505  TSH 0.515     Radiology Reports CT Angio Chest/Abd/Pel for Dissection W and/or Wo Contrast  Result Date: 09/09/2022 CLINICAL DATA:  Acute aortic syndrome suspected EXAM: CT ANGIOGRAPHY CHEST, ABDOMEN AND PELVIS TECHNIQUE: Non-contrast CT of the chest was initially obtained. Multidetector CT imaging through the chest, abdomen and pelvis was performed using the standard protocol during bolus administration of intravenous contrast. Multiplanar reconstructed images and MIPs were obtained and reviewed to evaluate the vascular anatomy. RADIATION DOSE REDUCTION: This exam was performed according to the departmental dose-optimization program which includes automated exposure control, adjustment of the mA and/or kV according to patient size and/or use of iterative reconstruction technique. CONTRAST:  75mL OMNIPAQUE IOHEXOL 350 MG/ML SOLN COMPARISON:  07/19/2022, 01/17/2019 FINDINGS: CTA CHEST FINDINGS VASCULAR Aorta: Satisfactory opacification of the aorta. Unchanged contour and caliber of the thoracic aorta, tubular ascending thoracic aorta measuring up to 3.9 x 3.8 cm. Unchanged enlargement of the descending thoracic aorta, notable for a thrombosed focal aneurysm or nonacute penetrating at the left aspect of the mid descending vessel, maximum caliber of the vessel at this level 5.5 x 4.1 cm (series 7, image 76). Additional unchanged chronic, thrombosed focal aneurysm or penetrating ulceration of the level  of the diaphragmatic hiatus, measuring up to 5.1 x 4.2 cm (series 7, image 136). Severe mixed calcific atherosclerosis Cardiovascular: No evidence of pulmonary embolism on limited non-tailored examination. Cardiomegaly. No pericardial effusion. Review of the MIP images confirms the above findings. NON VASCULAR Mediastinum/Nodes: No enlarged mediastinal, hilar, or axillary lymph nodes. Thyroid gland, trachea, and esophagus demonstrate no significant findings. Lungs/Pleura: Lungs are clear. No pleural effusion or pneumothorax. Musculoskeletal: No chest wall abnormality. No acute osseous findings. Review of the MIP images confirms the above findings. CTA ABDOMEN AND PELVIS FINDINGS VASCULAR Unchanged contour and caliber of the abdominal aorta. Severe mixed atherosclerosis with fusiform aneurysm of the upper  abdominal aorta, contiguous with aneurysmal dilatation described above, without aneurysm of the infrarenal vessel. No evidence of dissection or other acute aortic pathology. Standard branching pattern of the abdominal aorta with solitary bilateral renal arteries. Review of the MIP images confirms the above findings. NON-VASCULAR Hepatobiliary: No solid liver abnormality is seen. No gallstones, gallbladder wall thickening, or biliary dilatation. Pancreas: Unremarkable. No pancreatic ductal dilatation or surrounding inflammatory changes. Spleen: Normal in size without significant abnormality. Adrenals/Urinary Tract: Adrenal glands are unremarkable. Kidneys are normal, without renal calculi, solid lesion, or hydronephrosis. Bladder is unremarkable. Stomach/Bowel: Stomach is within normal limits. Appendix appears normal. No evidence of bowel wall thickening, distention, or inflammatory changes. Descending and sigmoid diverticulosis. Lymphatic: No enlarged abdominal or pelvic lymph nodes. Reproductive: No mass. Similar loculated fluid or cystic lesion of the right ovary or adnexa measuring 4.8 x 2.5 cm, stable over  numerous prior examinations dating back to at least 2020 and presumed benign (series 7, image 263) Other: No abdominal wall hernia or abnormality. No ascites. Musculoskeletal: No acute osseous findings. IMPRESSION: 1. Unchanged enlargement of the descending thoracic aorta, notable for a thrombosed focal aneurysm or nonacute penetrating ulceration at the left aspect of the mid descending vessel, maximum caliber of the vessel at this level 5.5 x 4.1 cm. 2. 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 3. 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 4. No evidence of dissection or other acute aortic pathology. 5. Severe mixed atherosclerosis throughout. 6. No acute findings. Aortic Atherosclerosis (ICD10-I70.0). Electronically Signed   By: Jearld Lesch M.D.   On: 09/09/2022 17:42      Signature  -   Susa Raring M.D on 09/11/2022 at 9:24 AM   -  To page go to [email or web address]

## 2022-09-11 NOTE — Evaluation (Signed)
Physical Therapy Evaluation Patient Details Name: Erin Riley MRN: [id number] DOB: 09/08/1950 Today's Date: 09/11/2022  History of Present Illness  72 y.o. female presents to [facility] hospital on 09/09/2022 with L low back pain. PMH includes HTN, hypertrophic cardiomyopathy, CHF, PAD, CKD III, HLD, schizophrenia, AAA.  Clinical Impression  Pt presents to PT with reports of low back pain which limit mobility tolerance. Pt is able to ambulate for household distances at this time, PT suggests use of RW if this aides in reducing stress and pain on low back. PT provides education on use of lumbar roll in an effort to promote a more lordotic lumbar curve. Pt will benefit from continued acute PT services in an effort to reduce low back pain and improve activity tolerance. PT recommends discharge home when medically appropriate.      Assistance Recommended at Discharge PRN  If plan is discharge home, recommend the following:  Can travel by private vehicle  Assist for transportation        Equipment Recommendations None recommended by PT  Recommendations for Other Services       Functional Status Assessment Patient has had a recent decline in their functional status and demonstrates the ability to make significant improvements in function in a reasonable and predictable amount of time.     Precautions / Restrictions Precautions Precautions: Fall Restrictions Weight Bearing Restrictions: No      Mobility  Bed Mobility               General bed mobility comments: received and left in sitting    Transfers Overall transfer level: Modified independent Equipment used: Rolling walker (2 wheels), None                    Ambulation/Gait Ambulation/Gait assistance: Modified independent (Device/Increase time) Gait Distance (Feet): 100 Feet (initial trial of 10' with RW) Assistive device: Rolling walker (2 wheels), None Gait Pattern/deviations: Step-through pattern, Wide base of  support, Decreased stance time - left Gait velocity: reduced Gait velocity interpretation: <1.8 ft/sec, indicate of risk for recurrent falls   General Gait Details: pt with slowed step-through gait, reduced stance time on LLE  Stairs            Wheelchair Mobility     Tilt Bed    Modified Rankin (Stroke Patients Only)       Balance Overall balance assessment: Needs assistance Sitting-balance support: No upper extremity supported, Feet supported Sitting balance-Leahy Scale: Good     Standing balance support: No upper extremity supported, During functional activity Standing balance-Leahy Scale: Good                               Pertinent Vitals/Pain Pain Assessment Pain Assessment: 0-10 Pain Score: 9  Pain Location: low back Pain Descriptors / Indicators: Aching Pain Intervention(s): Monitored during session    Home Living Family/patient expects to be discharged to:: Group home Living Arrangements: Non-relatives/Friends Available Help at Discharge: Friend(s);Other (Comment);Available PRN/intermittently (group home staff) Type of Home: Group Home Home Access: Level entry       Home Layout: One level Home Equipment: Agricultural consultant (2 wheels);BSC/3in1      Prior Function Prior Level of Function : Independent/Modified Independent             Mobility Comments: ambulatory without DME       Hand Dominance        Extremity/Trunk Assessment   Upper  Extremity Assessment Upper Extremity Assessment: Generalized weakness    Lower Extremity Assessment Lower Extremity Assessment: Generalized weakness    Cervical / Trunk Assessment Cervical / Trunk Assessment: Kyphotic (reduced lordosis)  Communication   Communication: No difficulties  Cognition Arousal/Alertness: Awake/alert Behavior During Therapy: WFL for tasks assessed/performed Overall Cognitive Status: History of cognitive impairments - at baseline                                  General Comments: slowed processing        General Comments General comments (skin integrity, edema, etc.): VSS on RA    Exercises Other Exercises Other Exercises: instruction provided for lumbar roll to aide in promoting increased lumbar lordosis   Assessment/Plan    PT Assessment Patient needs continued PT services  PT Problem List Decreased activity tolerance;Decreased mobility;Pain       PT Treatment Interventions DME instruction;Gait training;Stair training;Functional mobility training;Therapeutic exercise;Patient/family education    PT Goals (Current goals can be found in the Care Plan section)  Acute Rehab PT Goals Patient Stated Goal: to return to group home PT Goal Formulation: With patient Time For Goal Achievement: 09/25/22 Potential to Achieve Goals: Good    Frequency Min 2X/week     Co-evaluation               AM-PAC PT "6 Clicks" Mobility  Outcome Measure Help needed turning from your back to your side while in a flat bed without using bedrails?: None Help needed moving from lying on your back to sitting on the side of a flat bed without using bedrails?: None Help needed moving to and from a bed to a chair (including a wheelchair)?: None Help needed standing up from a chair using your arms (e.g., wheelchair or bedside chair)?: None Help needed to walk in hospital room?: None Help needed climbing 3-5 steps with a railing? : None 6 Click Score: 24    End of Session   Activity Tolerance: Patient tolerated treatment well Patient left: in bed;with call bell/phone within reach;with nursing/sitter in room Nurse Communication: Mobility status PT Visit Diagnosis: Other abnormalities of gait and mobility (R26.89);Pain Pain - Right/Left: Left Pain - part of body:  (low back)    Time: 1610-9604 PT Time Calculation (min) (ACUTE ONLY): 12 min   Charges:   PT Evaluation $PT Eval Low Complexity: 1 Low   PT General Charges $$ ACUTE PT  VISIT: 1 Visit         Arlyss Gandy, PT, DPT Acute Rehabilitation Office [phone]   Arlyss Gandy 09/11/2022, 10:44 AM

## 2022-09-11 NOTE — Progress Notes (Signed)
OT Cancellation Note  Patient Details Name: Erin Riley MRN: [id number] DOB: May 13, 1950   Cancelled Treatment:    Reason Eval/Treat Not Completed: Patient at procedure or test/ unavailable;Patient declined, no reason specified (2 attempts made. Initially pt getting an ECHO, second attempt pt declinig despite maximal encouragement)  Donia Pounds 09/11/2022, 3:11 PM

## 2022-09-11 NOTE — Progress Notes (Addendum)
  Progress Note    09/11/2022 8:13 AM * No surgery found *  Subjective:  complains of back pain but states this is normal for her   Vitals:   09/11/22 0200 09/11/22 0715  BP: (!) 174/93 (!) 123/108  Pulse: 78 65  Resp: 14 16  Temp:    SpO2: 94% 95%   Physical Exam: Lungs:  non labored Extremities:  moving all ext well; feet warm without wounds Abdomen:  soft, NT, ND Neurologic: alert but disoriented  CBC    Component Value Date/Time   WBC 4.8 09/10/2022 0032   RBC 3.92 09/10/2022 0032   HGB 11.8 (L) 09/10/2022 0032   HGB 10.4 (L) 02/15/2021 1048   HCT 37.1 09/10/2022 0032   HCT 32.5 (L) 02/15/2021 1048   PLT 241 09/10/2022 0032   PLT 360 02/15/2021 1048   MCV 94.6 09/10/2022 0032   MCV 89 02/15/2021 1048   MCH 30.1 09/10/2022 0032   MCHC 31.8 09/10/2022 0032   RDW 13.6 09/10/2022 0032   RDW 12.7 02/15/2021 1048   LYMPHSABS 1.8 09/09/2022 1505   LYMPHSABS 1.8 02/15/2021 1048   MONOABS 0.4 09/09/2022 1505   EOSABS 0.2 09/09/2022 1505   EOSABS 0.3 02/15/2021 1048   BASOSABS 0.0 09/09/2022 1505   BASOSABS 0.0 02/15/2021 1048    BMET    Component Value Date/Time   NA 136 09/10/2022 0032   NA 140 02/15/2021 1048   K 4.3 09/10/2022 0032   CL 105 09/10/2022 0032   CO2 25 09/10/2022 0032   GLUCOSE 101 (H) 09/10/2022 0032   BUN 19 09/10/2022 0032   BUN 16 02/15/2021 1048   CREATININE 1.40 (H) 09/10/2022 0032   CREATININE 1.21 (H) 06/15/2014 1025   CALCIUM 9.0 09/10/2022 0032   GFRNONAA 40 (L) 09/10/2022 0032   GFRAA 42 (L) 12/09/2019 0031    INR    Component Value Date/Time   INR 1.07 09/16/2014 1100     Intake/Output Summary (Last 24 hours) at 09/11/2022 0813 Last data filed at 09/10/2022 1359 Gross per 24 hour  Intake 600 ml  Output --  Net 600 ml     Assessment/Plan:  72 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  She complains of back pain this morning but states this is normal  for her.  She denies any abd pain and is moving all extremities well.  We again discussed her thoracoabdominal aneurysm and she is willing to have this repaired.  She is currently being treated for HTN and schizophrenia.  Dr. Randie Heinz to discuss timing of elective repair.   Erin Rutter, PA-C [facility] [phone] 09/11/2022 8:13 AM  I have independently interviewed and examined patient and agree with PA assessment and plan above.  Patient will need complex endovascular thoracoabdominal aneurysm repair.  I have discussed this with the patient I am not sure of her level of understanding.  She is okay for discharge from vascular standpoint I will see her back in the office in a few weeks to discuss operative details and timing.  Alwyn Cordner C. Randie Heinz, MD [facility] [place] Office: [phone] Pager: [phone]

## 2022-09-11 NOTE — Discharge Instructions (Signed)
[facility] Directory Assisted Living Facilities: -[place] at [facility] [address] [place], [place] [address] Phone: [phone] Total Beds: 28 Dementia Unit? No Special Assistance Not Accepted -[address] Everardo Beals [place], [place] [address] Phone: [phone] Total Beds: 93 Dementia Unit? No State/County Special Assistance Accepted -[address] [place], [place] [address] Phone: [phone] Total Beds: 125 Dementia Unit? Yes Special Assistance Not Accepted -[facility] [address] [place], [place] [address] Phone: [phone] Total Beds: 82 Dementia Unit? No Special Assistance Not Accepted -[facility] [address] [place], [place] [address] Phone: [phone] Total Beds: 102 Dementia Unit? No Special Assistance Not Accepted -[facility] [address] [place], [place] [address] Phone: [phone] Total Beds: 65 Dementia Unit? Yes Special Assistance Not Accepted -[facility] [address] [place], [place] [address] Phone: [phone] Total Beds: 118 Dementia Unit? Yes Special Assistance Not Accepted -[facility] [address] [place], [place] [address] Phone: [phone] Total Beds: 55 Dementia Unit? No Special Assistance Not Accepted -[facility] [address] [place], [place] [address] Phone: [phone] Total Beds: 84 Dementia Unit? No Special Assistance Not Accepted -[facility] [address] [place], [place] [address] Phone: [phone] Total Beds: 108 Dementia Unit? Yes Special Assistance Not Accepted -[facility] [address] [place], [place] [address] Phone: [phone] Total Beds: 30 Dementia Unit? No State/County Special Assistance Accepted Haynes Bast  [address] [place], [place] [address] Phone: [phone] Total Beds: 60 Dementia Unit? Yes State/County Special Assistance Accepted -[facility] [employer] [place], [place] [address] Phone: [phone] Total Beds: 100 Dementia Unit? Yes Special Assistance Not Accepted Francesco Runner [address] [place], [place] [address] Phone: [phone] Total Beds: 96 Dementia Unit? Yes State/County Special Assistance Accepted -[facility] [address] [place], [place] [address] Phone: [phone] Total Beds: 18 Dementia Unit? No State/County Special Assistance Accepted -[place] [facility] [employer] [place], [place] [address] Phone: [phone] Total Beds: 105 Dementia Unit? Yes Special Assistance Not Accepted -[facility] [address] [place], [place] [address] Phone: [phone] Total Beds: 66 Dementia Unit? Yes State/County Special Assistance Accepted [facility] [address] [place], [place] [address] Phone: [phone] Total Beds: 6 Dementia Unit? Yes Special Assistance Not Accepted -[address] of [address] [place], [place] [address] Phone: [phone] Total Beds: 100 Dementia Unit? Yes Special Assistance Not Accepted -[facility] [address] [place], [place] [address] Phone: [phone] Total Beds: 48 Dementia Unit? Yes Special Assistance Not Accepted -[facility] [address] [place], [place] [address] Phone: [phone] Total Beds: 48 Dementia Unit? Yes Special Assistance Not Accepted -[facility] [address] [place], [place] [address] Phone: [phone] Total Beds: 45 Dementia Unit? Yes Special Assistance Not Accepted -[address] [place], [place] [address] Phone: [phone] Total Beds: 16 Dementia Unit? Yes State/County Special Assistance  Accepted -[place] [address] [place], [place] [address] Phone: [phone] Total Beds: 90 Dementia Unit? Yes State/County Special Assistance Accepted    Follow with Primary MD Hoy Register, MD and your psychiatrist in 7 days   Get CBC, CMP, 2 view Chest X ray -  checked next visit with your primary MD    Activity: As tolerated with Full fall precautions use walker/cane & assistance as needed  Disposition Home    Diet: Heart Healthy    Special Instructions: If you have smoked or chewed Tobacco  in the last 2 yrs please stop smoking, stop any regular Alcohol  and or any Recreational drug use.  On your next visit with your primary care physician please Get Medicines reviewed and adjusted.  Please request your Prim.MD to go over all Hospital Tests and Procedure/Radiological results at the follow up, please get all Hospital records sent to your Prim MD by signing hospital release before you go home.  If you experience worsening of your admission symptoms, develop shortness of breath, life threatening emergency, suicidal or homicidal thoughts you must seek medical attention immediately by calling 911 or calling your MD immediately  if symptoms less severe.  You Must read complete instructions/literature along with all the possible adverse reactions/side effects for all the Medicines you take and that have been prescribed to you. Take any new Medicines after you have completely understood and accpet all the possible adverse reactions/side effects.   Do not drive when taking Pain medications.  Do not take more than prescribed Pain, Sleep and Anxiety Medications

## 2022-09-11 NOTE — Consult Note (Signed)
[name] Psychiat[facility] Consult Evaluation  Service Date: September 11, 2022 LOS:  LOS: 2 days    Primary Psychiatric Diagnoses  Schizophrenia  Assessment  Erin Riley is a 72 y.o. female admitted medically for 09/09/2022  2:45 PM for back pain. She carries the psychiatric diagnoses of schizophrenia and has a past medical history of CKD, obesity, cardiomyopathy, AAA, CAD, CHF, sinus brady. Psychiatry was consulted for Schizophrenic, having delusions and some hallucinations  by Dr. Thedore Mins.   Patient was not particularly engaged in interview. She did not display overt evidence of delusions, hallucinations, or disorganized thought processes on my brief interview. Per discharge summary on 06/14/2022, she does have some ongoing thought disorganization at baseline. She appears improved compared to serial ED evaluations in April 2024 in which she was speaking nonsensically and had ongoing delusions about being attacked by snakes. She has responded to olanzapine on several occasions; this has already been restarted by the primary team. Current outpatient psychotropic medications include olanzapine  and historically she has had a fair response but very sporadic compliance. On initial examination, patient was not cooperative and will functionally need to be reassessed tomorrow (not medically stable for discharge). Please see plan below for detailed recommendations.   She was put under IVC in the ED for "going in and out of paranoia while in the emergency department and unsafe to medically manage herself at home" Diagnoses:  Active Hospital problems: Principal Problem:   Hypertensive crisis Active Problems:   CKD stage 3a, GFR 45-59 ml/min (HCC)   Obesity (BMI 30-39.9)   Hypertrophic cardiomyopathy (HCC)   Schizophrenia (HCC)   Aneurysm of descending thoracic aorta without rupture (HCC)   History of abdominal aortic aneurysm (AAA)   CAD (coronary artery disease)   Chronic systolic CHF (congestive heart  failure) reduced EF 30-35 (HCC)   Sinus bradycardia     Plan   ## Psychiatric Medication Recommendations:  -- continue current zyprexa 10 mg PO QHS  ## Medical Decision Making Capacity:  -- not formally assessed   ## Further Work-up:  -- none currently recommended While pt on Qtc prolonging medications, please monitor & replete K+ to 4 and Mg2+ to 2   -- most recent EKG on 07/07 had QtC of 423 -- Pertinent labwork reviewed earlier this admission includes:  mild anemia, UDS Pending  ## Disposition:  -- Does not appear to require inpt psychiatric hospitalization at this time   ## Behavioral / Environmental:  --   No specific recommendations at this time.     ## Safety and Observation Level:  -Needs sitter - under 1:1.     Thank you for this consult request. Recommendations have been communicated to the primary team.  We will continue to follow at this time.   Erin Riley A Erin Riley  Psychiatric and Social History   Relevant Aspects of Hospital Course:  Admitted on 09/09/2022 for BP elevation after presenting to the ED for back pain. They were put under IVC for paranoia in the ED.[name]   Patient Report:  Pt seen in AM.  She is aware that she is in the hospital for pain. She stated she lives at [facility]" She was not particularly cooperative with interview. At one point I asked if I was talking to "Erin Riley", she said "no", and when I asked who I was talking to stated "you just said my name". She did deny SI, HI and AH/VH. When I spoke to the patient, she denied SI, HI, and  AH/VH. I asked specifically about concerns or worries about snakes (content of prior delusions) which she denied. She did not give permission to call for collateral. She fell asleep multiple times during interview. I spoke ot her sitter who stated she didn't sleep well all night but was more interactive with primary physician.   Psych ROS:  Pt did not participate in ROS beyond what was above  Collateral  information:  Did not get permission to contact. From ED attending attestation: 7/   Patient had waxing and waning disorganized thoughts and speech while in the ED concerning for decompensated schizophrenia. Although she states that she has been taking her medications, she was subsequently endorsed paranoia regarding medications.   Psychiatric History:  Information collected from patient and medical record  Prev Dx/Sx: schizophrenia Current Psych Provider: none outpt Home Meds (current): zyprexa Previous Med Trials: zyprexa Therapy: unknown  Prior ECT: unknown Prior Psych Hospitalization: yes, multiple  Prior Self Harm: unknown, none documented Prior Violence: none documented   Social History:   Access to weapons: lives in group home    Exam Findings   Psychiatric Specialty Exam:  Presentation  General Appearance: Fairly Groomed  Eye Contact:Fair  Speech:Pressured  Speech Volume:Normal  Handedness:Right   Mood and Affect  Mood:Irritable  Affect:Congruent   Thought Process  Thought Processes:Disorganized  Descriptions of Associations:Tangential  Orientation:Full (Time, Place and Person)  Thought Content:Tangential; Paranoid Ideation  Hallucinations:No data recorded Ideas of Reference:Paranoia  Suicidal Thoughts:No data recorded Homicidal Thoughts:No data recorded  Sensorium  Memory:Immediate Fair; Recent Fair  Judgment:Impaired  Insight:Poor   Executive Functions  Concentration:Fair  Attention Span:Fair  Recall:Fair  Fund of Knowledge:Fair  Language:Fair   Psychomotor Activity  Psychomotor Activity:No data recorded  Assets  Assets:Desire for Improvement; Leisure Time; Physical Health; Resilience; Social Support   Sleep  Sleep:No data recorded   Physical Exam: Vital signs:  Temp:  [97.5 F (36.4 C)-98 F (36.7 C)] 98 F (36.7 C) (07/08 1721) Pulse Rate:  [65-78] 69 (07/08 1721) Resp:  [14-21] 19 (07/08 1721) BP:  (123-175)/(84-108) 128/84 (07/08 1721) SpO2:  [94 %-95 %] 95 % (07/08 1721) Physical Exam HENT:     Head: Normocephalic.  Neurological:     Mental Status: She is alert.     Blood pressure 128/84, pulse 69, temperature 98 F (36.7 C), temperature source Oral, resp. rate 19, height 5\' 7"  (1.702 m), weight 81.6 kg, last menstrual period 05/10/2013, SpO2 95 %. Body mass index is 28.19 kg/m.   Other History   These have been pulled in through the EMR, reviewed, and updated if appropriate.   Family History:   The patient's family history includes Hypertension in her mother.  Medical History: Past Medical History:  Diagnosis Date   Chronic back pain    Chronic kidney disease (CKD), stage II (mild)    Class I-II   Coronary artery disease 04/2009   50% stenosis in the perforator of LAD; catheterization was for an abnormal Myoview in January 2000 showing anterior and inferolateral ischemia.   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 > 3 subsequent Echos show improved/normal EF with moderate to severe concentric LVH and diastolic dysfunction with LVOT/intracavitary gradient --> 06/2016: Severe LVH.  Vigorous EF, 65-70%.?? Gr 1 DD. Mild AS.   Hyperlipidemia    Hypertension    Hypertensive hypertrophic cardiomyopathy: NYHA class II:  Echo: Severe concentric LVH with  LV OT gradient; essentially preserved EF with diastolic dysfunction 02/15/2013   Echo 06/2016: Severe Concentric LVH. Vigorous EF 65-70%. ~ Gr I DD.    Mild aortic stenosis by prior echocardiography    Echo 06/2016: Mild AS (Mean Gradient 15 mmHg); has had prior Mod-Severe MR (not seen on current echo)   PAD (peripheral artery disease) (HCC) March 2013   Lower extremity Dopplers: R. SFA 50-60%, R. PTA proximally occluded with distal reconstitution;; L. common iliac ~50%, L. SFA 50-70% stenosis, L. PTA < 50%    Schizophrenia (HCC)     Surgical History: Past Surgical History:  Procedure Laterality Date   BUNIONECTOMY     carotid doppler  05/29/2011   left bulb/prox ICA moderate amtfibrous plaque with no evidence significant reduction.,right bulb /proximal ICA normal patency   lower extremity doppler  05/29/2011   right SFA 50% to 59% diameter reduction,right posterior tibal atreery occlusive disease,reconstituting distally, left common illiac<50%,left SFA 50 to70%,left post. tibial <50%   NM MYOCAR PERF WALL MOTION  03/2009   Persantine; EF 51%-both anterior and inferolateral ischemia   TRANSTHORACIC ECHOCARDIOGRAM  06/2016   Severe LVH.  Vigorous EF of 65-70%.  No RWMA. ~Only grade 1 diastolic dysfunction.  Mild aortic stenosis (mean gradient 15 mmHg)   TRANSTHORACIC ECHOCARDIOGRAM  07/2012   EF 50-55%; severe concentric LVH; only grade 1 diastolic dysfunction. Mild aortic sclerosis - with LVOT /intracavitary gradient of roughly 20 mmHg mean. Mild to moderately dilated LA;; previously reported MR not seen    Medications:   Current Facility-Administered Medications:    acetaminophen (TYLENOL) tablet 650 mg, 650 mg, Oral, Q6H PRN **OR** acetaminophen (TYLENOL) suppository 650 mg, 650 mg, Rectal, Q6H PRN, Sundil, Subrina, MD   amLODipine (NORVASC) tablet 10 mg, 10 mg, Oral, Daily, Susa Raring K, MD, 10 mg at 09/11/22 1337   aspirin EC tablet 81 mg, 81 mg, Oral, Daily, Sundil, Subrina, MD, 81 mg at 09/11/22 0817   atorvastatin (LIPITOR) tablet 40 mg, 40 mg, Oral, QHS, Sundil, Subrina, MD, 40 mg at 09/10/22 2134   carvedilol (COREG) tablet 6.25 mg, 6.25 mg, Oral, BID WC, Susa Raring K, MD, 6.25 mg at 09/11/22 1719   docusate sodium (COLACE) capsule 100 mg, 100 mg, Oral, BID, Sundil, Subrina, MD, 100 mg at 09/11/22 0817   empagliflozin (JARDIANCE) tablet 10 mg, 10 mg, Oral, Daily, Sundil, Subrina, MD, 10 mg at 09/11/22 1610   ferrous sulfate tablet 325 mg, 325 mg, Oral, Q breakfast, Sundil,  Subrina, MD, 325 mg at 09/11/22 0817   furosemide (LASIX) tablet 40 mg, 40 mg, Oral, Daily, Sundil, Subrina, MD, 40 mg at 09/11/22 0817   hydrALAZINE (APRESOLINE) injection 10 mg, 10 mg, Intravenous, Q8H PRN, Janalyn Shy, Subrina, MD, 10 mg at 09/10/22 1159   hydrALAZINE (APRESOLINE) tablet 50 mg, 50 mg, Oral, Q8H, Rai, Ripudeep K, MD, 50 mg at 09/11/22 1337   isosorbide mononitrate (IMDUR) 24 hr tablet 60 mg, 60 mg, Oral, Daily, Sundil, Subrina, MD, 60 mg at 09/11/22 0816   lidocaine (LIDODERM) 5 % 1 patch, 1 patch, Transdermal, Q24H, Sundil, Subrina, MD, 1 patch at 09/11/22 1718   morphine (PF) 2 MG/ML injection 2 mg, 2 mg, Intravenous, Q2H PRN, Sundil, Subrina, MD, 2 mg at 09/11/22 1720   OLANZapine (ZYPREXA) tablet 10 mg, 10 mg, Oral, QHS, Sundil, Subrina, MD, 10 mg at 09/10/22 2134   ondansetron (ZOFRAN) tablet 4 mg, 4 mg, Oral, Q6H PRN **OR** ondansetron (ZOFRAN) injection 4 mg, 4 mg, Intravenous, Q6H PRN, Sundil, Subrina,  MD  Allergies: No Known Allergies

## 2022-09-11 NOTE — Telephone Encounter (Signed)
-----   Message from Maeola Harman, MD sent at 09/11/2022  2:02 PM EDT ----- Erin Riley [id number] 04/12/1950  Inpatient level 1 Diagnosis: Thoracoabdominal aneurysm  Please have patient follow-up with me in 3 to 4 weeks for surgical discussion.

## 2022-09-11 NOTE — Progress Notes (Signed)
Echocardiogram 2D Echocardiogram has been performed.  Erin Riley 09/11/2022, 2:58 PM

## 2022-09-11 NOTE — TOC Progression Note (Addendum)
Transition of Care [facility]) - Progression Note    Patient Details  Name: Erin Riley MRN: [id number] Date of Birth: 1951/01/30  Transition of Care [facility] CM/SW Contact  Gordy Clement, RN Phone Number: 09/11/2022, 4:25 PM  Clinical Narrative:     CM went to speak to patient regarding requested med assistance.  CM explained to Pateint that because  she has insurance there would be her normal co pays . She said she understood and meds would be no problem.  CM also asked about ALF assistance request.  Patient states she already lives in an ALF.  CSW informed CM that patient lives in [facility]; no ALF placement   [facility] has been [facility] to [facility] if patient does DC to home  Will need HH OT and PT orders     TOC will continue to follow patient for any additional discharge needs          Expected Discharge Plan and Services                                               Social Determinants of Health (SDOH) Interventions SDOH Screenings   Food Insecurity: No Food Insecurity (06/06/2022)  Housing: Low Risk  (06/06/2022)  Transportation Needs: No Transportation Needs (06/06/2022)  Utilities: Not At Risk (06/06/2022)  Alcohol Screen: Low Risk  (06/06/2022)  Depression (PHQ2-9): Low Risk  (01/26/2021)  Tobacco Use: High Risk (09/09/2022)    Readmission Risk Interventions     No data to display

## 2022-09-11 NOTE — Telephone Encounter (Signed)
Lvm to schedule appt.

## 2022-09-12 DIAGNOSIS — I169 Hypertensive crisis, unspecified: Secondary | ICD-10-CM | POA: Diagnosis not present

## 2022-09-12 LAB — CBC WITH DIFFERENTIAL/PLATELET
Abs Immature Granulocytes: 0.01 10*3/uL (ref 0.00–0.07)
Basophils Absolute: 0 10*3/uL (ref 0.0–0.1)
Basophils Relative: 0 %
Eosinophils Absolute: 0.2 10*3/uL (ref 0.0–0.5)
Eosinophils Relative: 3 %
HCT: 38.7 % (ref 36.0–46.0)
Hemoglobin: 12.2 g/dL (ref 12.0–15.0)
Immature Granulocytes: 0 %
Lymphocytes Relative: 38 %
Lymphs Abs: 2.2 10*3/uL (ref 0.7–4.0)
MCH: 30.1 pg (ref 26.0–34.0)
MCHC: 31.5 g/dL (ref 30.0–36.0)
MCV: 95.6 fL (ref 80.0–100.0)
Monocytes Absolute: 0.5 10*3/uL (ref 0.1–1.0)
Monocytes Relative: 9 %
Neutro Abs: 2.9 10*3/uL (ref 1.7–7.7)
Neutrophils Relative %: 50 %
Platelets: 237 10*3/uL (ref 150–400)
RBC: 4.05 MIL/uL (ref 3.87–5.11)
RDW: 13.5 % (ref 11.5–15.5)
WBC: 5.7 10*3/uL (ref 4.0–10.5)
nRBC: 0 % (ref 0.0–0.2)

## 2022-09-12 LAB — BASIC METABOLIC PANEL
Anion gap: 9 (ref 5–15)
BUN: 29 mg/dL — ABNORMAL HIGH (ref 8–23)
CO2: 26 mmol/L (ref 22–32)
Calcium: 9.3 mg/dL (ref 8.9–10.3)
Chloride: 101 mmol/L (ref 98–111)
Creatinine, Ser: 1.5 mg/dL — ABNORMAL HIGH (ref 0.44–1.00)
GFR, Estimated: 37 mL/min — ABNORMAL LOW (ref >60)
Glucose, Bld: 104 mg/dL — ABNORMAL HIGH (ref 70–99)
Potassium: 3.9 mmol/L (ref 3.5–5.1)
Sodium: 136 mmol/L (ref 135–145)

## 2022-09-12 LAB — MAGNESIUM: Magnesium: 2.3 mg/dL (ref 1.7–2.4)

## 2022-09-12 LAB — PROTIME-INR
INR: 1.1 (ref 0.8–1.2)
Prothrombin Time: 14.7 seconds (ref 11.4–15.2)

## 2022-09-12 LAB — TYPE AND SCREEN
ABO/RH(D): B POS
Antibody Screen: NEGATIVE

## 2022-09-12 LAB — BRAIN NATRIURETIC PEPTIDE: B Natriuretic Peptide: 90.5 pg/mL (ref 0.0–100.0)

## 2022-09-12 MED ORDER — HYDRALAZINE HCL 50 MG PO TABS
100.0000 mg | ORAL_TABLET | Freq: Three times a day (TID) | ORAL | Status: DC
  Administered 2022-09-12 – 2022-09-13 (×3): 100 mg via ORAL
  Filled 2022-09-12 (×4): qty 2

## 2022-09-12 NOTE — Evaluation (Signed)
Occupational Therapy Evaluation Patient Details Name: Erin Riley MRN: [id number] DOB: September 11, 1950 Today's Date: 09/12/2022   History of Present Illness 72 y.o. female presents to [facility] hospital on 09/09/2022 with L low back pain. PMH includes HTN, hypertrophic cardiomyopathy, CHF, PAD, CKD III, HLD, schizophrenia, AAA.   Clinical Impression   Pt presents at Ind - Mod I baseline level of function with ADLs/selfcare and mobility. No LOB or difficulty with LB ADLs and ADL mobility. Pt reports no back pain this session. All education completed and no further acute OT services are indicated at this time. OT will sign off     Recommendations for follow up therapy are one component of a multi-disciplinary discharge planning process, led by the attending physician.  Recommendations may be updated based on patient status, additional functional criteria and insurance authorization.   Assistance Recommended at Discharge PRN  Patient can return home with the following Direct supervision/assist for medications management;Assistance with cooking/housework;Assist for transportation    Functional Status Assessment  Patient has not had a recent decline in their functional status  Equipment Recommendations  None recommended by OT    Recommendations for Other Services       Precautions / Restrictions Precautions Precautions: Fall Restrictions Weight Bearing Restrictions: No      Mobility Bed Mobility Overal bed mobility: Independent                  Transfers Overall transfer level: Modified independent Equipment used: Rolling walker (2 wheels), None                      Balance Overall balance assessment: Mild deficits observed, not formally tested                                         ADL either performed or assessed with clinical judgement   ADL Overall ADL's : Modified independent;At baseline                                              Vision Ability to See in Adequate Light: 0 Adequate Patient Visual Report: No change from baseline       Perception     Praxis      Pertinent Vitals/Pain Pain Assessment Pain Assessment: No/denies pain Pain Score: 0-No pain Pain Intervention(s): Monitored during session     Hand Dominance Right   Extremity/Trunk Assessment Upper Extremity Assessment Upper Extremity Assessment: Generalized weakness   Lower Extremity Assessment Lower Extremity Assessment: Defer to PT evaluation       Communication Communication Communication: No difficulties   Cognition Arousal/Alertness: Awake/alert Behavior During Therapy: WFL for tasks assessed/performed Overall Cognitive Status: History of cognitive impairments - at baseline                                       General Comments       Exercises     Shoulder Instructions      Home Living Family/patient expects to be discharged to:: Group home Living Arrangements: Non-relatives/Friends Available Help at Discharge: Friend(s);Other (Comment);Available PRN/intermittently Type of Home: Group Home Home Access: Level entry     Home Layout: One level  Home Equipment: Agricultural consultant (2 wheels);BSC/3in1          Prior Functioning/Environment Prior Level of Function : Independent/Modified Independent             Mobility Comments: ambulatory without DME ADLs Comments: Ind with ADLs/selfcare        OT Problem List: Decreased activity tolerance      OT Treatment/Interventions:      OT Goals(Current goals can be found in the care plan section) Acute Rehab OT Goals Patient Stated Goal: go home  OT Frequency:      Co-evaluation              AM-PAC OT "6 Clicks" Daily Activity     Outcome Measure Help from another person eating meals?: None Help from another person taking care of personal grooming?: None Help from another person toileting, which includes using  toliet, bedpan, or urinal?: None Help from another person bathing (including washing, rinsing, drying)?: None Help from another person to put on and taking off regular upper body clothing?: None Help from another person to put on and taking off regular lower body clothing?: None 6 Click Score: 24   End of Session Equipment Utilized During Treatment: Gait belt  Activity Tolerance: Patient tolerated treatment well Patient left: in bed;with call bell/phone within reach;Other (comment);with nursing/sitter in room (sitting EOB, sitter present)  OT Visit Diagnosis: Muscle weakness (generalized) (M62.81)                Time: 1610-9604 OT Time Calculation (min): 19 min Charges:  OT General Charges $OT Visit: 1 Visit OT Evaluation $OT Eval Low Complexity: 1 Low    Galen Manila 09/12/2022, 3:04 PM

## 2022-09-12 NOTE — Plan of Care (Signed)

## 2022-09-12 NOTE — Progress Notes (Addendum)
PROGRESS NOTE                                                                                                                                                                                                             Patient Demographics:    Erin Riley, is a 72 y.o. female, DOB - 1951-01-26, WUJ:[id number]  Outpatient Primary MD for the patient is Hoy Register, MD    LOS - 3  Admit date - 09/09/2022    Chief Complaint  Patient presents with   Back Pain       Brief Narrative (HPI from H&P)   72 year old female with HTN, hypertrophic cardiomyopathy, chronic systolic and diastolic CHF with EF 25 to 30%, G2 DD, PAD, CKD stage IIIa, hyperlipidemia, schizophrenia, AAA presented to ED from group home with left-sided lower back pain. Patient's symptoms started 3 days prior to admission, denied any fall. Reported back pain is nonradiating, no chest pain, palpitations, blurry vision, headache. Lives in a group home, noncompliant with her BP medications.  He was diagnosed with uncontrolled hypertension, delusions and admitted to the hospital.   Subjective:   Patient in bed, appears comfortable, denies any headache, no fever, no chest pain or pressure, no shortness of breath , no abdominal pain. No focal weakness.   Assessment  & Plan :    Nonspecific chest pain/back pain in a patient with history of chronic descending thoracic aorta aneurysm, noncompliance with medications.  Symptoms due to hypertensive crisis. She admitted to the hospital, counseled on compliance, seen by vascular surgery, her aneurysm is chronic and for now medical management only per vascular surgery with outpatient follow-up with Dr. Randie Heinz postdischarge, blood pressure medications adjusted for better control, pain has resolved continue to monitor.  Hypertrophic cardiomyopathy.  Had sinus bradycardia upon admission.  Will try and better control her blood pressure,  blood pressure medications adjusted further on 09/12/2022, Coreg dose reduced to less than half of home dose, monitor heart rate, stable TSH.    CAD with chronic systolic heart failure EF 35%.  Currently compensated, continue aspirin, statin, low-dose beta-blocker, avoiding ACE/ARB due to underlying renal dysfunction, repeat echo pending.    CKD stage 3A. Baseline creatinine around 1.5.  Continue to monitor.  Had mild AKI which has resolved.  Schizophrenia.  Lives at a group home, question compliance with her home  Zyprexa, home medication continued, delusions slightly improved on 09/12/2022, psych to evaluate.      Condition - Fair  Family Communication  :  None  Code Status :  Full  Consults  :  VVS, Psych  PUD Prophylaxis :     Procedures  :     CT - 1. Unchanged enlargement of the descending thoracic aorta, notable for a thrombosed focal aneurysm or nonacute penetrating ulceration at the left aspect of the mid descending vessel, maximum caliber of the vessel at this level 5.5 x 4.1 cm. 2. 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 3. 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 4. No evidence of dissection or other acute aortic pathology. 5. Severe mixed atherosclerosis throughout. 6. No acute findings. Aortic Atherosclerosis (ICD10-I70.0).      Disposition Plan  :    Status is: Inpatient  DVT Prophylaxis  :    SCDs Start: 09/09/22 2254    Lab Results  Component Value Date   PLT 237 09/12/2022    Diet :  Diet Order             Diet Heart Room service appropriate? Yes; Fluid consistency: Thin  Diet effective now                    Inpatient Medications  Scheduled Meds:  amLODipine  10 mg Oral Daily   aspirin EC  81 mg Oral Daily   atorvastatin  40 mg  Oral QHS   carvedilol  6.25 mg Oral BID WC   docusate sodium  100 mg Oral BID   empagliflozin  10 mg Oral Daily   ferrous sulfate  325 mg Oral Q breakfast   furosemide  40 mg Oral Daily   hydrALAZINE  100 mg Oral Q8H   isosorbide mononitrate  60 mg Oral Daily   lidocaine  1 patch Transdermal Q24H   OLANZapine  10 mg Oral QHS   Continuous Infusions: PRN Meds:.acetaminophen **OR** acetaminophen, hydrALAZINE, morphine injection, ondansetron **OR** ondansetron (ZOFRAN) IV  Antibiotics  :    Anti-infectives (From admission, onward)    None        Objective:   Vitals:   09/11/22 2314 09/12/22 0317 09/12/22 0400 09/12/22 0748  BP: 139/71 (!) 171/89  (!) 153/104  Pulse: 63 69  61  Resp: 15 18  14   Temp:  (!) 97.5 F (36.4 C)    TempSrc:  Oral    SpO2: 95% 95%  97%  Weight:   109.2 kg   Height:        Wt Readings from Last 3 Encounters:  09/12/22 109.2 kg  07/19/22 102 kg  06/06/22 101.8 kg     Intake/Output Summary (Last 24 hours) at 09/12/2022 0812 Last data filed at 09/11/2022 1825 Gross per 24 hour  Intake 720 ml  Output 0 ml  Net 720 ml     Physical Exam  Awake Alert, No new F.N deficits, Normal affect [place].AT,PERRAL Supple Neck, No JVD,   Symmetrical Chest wall movement, Good air movement bilaterally, CTAB RRR,No Gallops,Rubs or new Murmurs,  +ve B.Sounds, Abd Soft, No tenderness,   No Cyanosis, Clubbing or edema     Data Review:    Recent Labs  Lab 09/09/22 1505 09/09/22 1518 09/10/22 0032 09/12/22 0303  WBC 5.0  --  4.8 5.7  HGB 13.1 14.3 11.8* 12.2  HCT 41.3 42.0 37.1 38.7  PLT 265  --  241 237  MCV 94.9  --  94.6 95.6  MCH 30.1  --  30.1 30.1  MCHC 31.7  --  31.8 31.5  RDW 13.7  --  13.6 13.5  LYMPHSABS 1.8  --   --  2.2  MONOABS 0.4  --   --  0.5  EOSABS 0.2  --   --  0.2  BASOSABS 0.0  --   --  0.0    Recent Labs  Lab 09/09/22 1505 09/09/22 1518 09/10/22 0032 09/12/22 0303  NA 138 139 136 136  K 4.6 4.7 4.3 3.9  CL 104 107  105 101  CO2 25  --  25 26  ANIONGAP 9  --  6 9  GLUCOSE 129* 129* 101* 104*  BUN 22 26* 19 29*  CREATININE 1.67* 1.60* 1.40* 1.50*  AST  --   --  16  --   ALT  --   --  16  --   ALKPHOS  --   --  68  --   BILITOT  --   --  0.8  --   ALBUMIN  --   --  3.1*  --   INR  --   --   --  1.1  TSH 0.515  --   --   --   BNP  --   --   --  90.5  MG  --   --   --  2.3  CALCIUM 9.6  --  9.0 9.3      Recent Labs  Lab 09/09/22 1505 09/10/22 0032 09/12/22 0303  INR  --   --  1.1  TSH 0.515  --   --   BNP  --   --  90.5  MG  --   --  2.3  CALCIUM 9.6 9.0 9.3   Lab Results  Component Value Date   CHOL 147 03/27/2022   HDL 37 (L) 03/27/2022   LDLCALC 92 03/27/2022   TRIG 89 03/27/2022   CHOLHDL 4.0 03/27/2022    Lab Results  Component Value Date   HGBA1C 5.8 (H) 06/10/2022    Recent Labs    09/09/22 1505  TSH 0.515     Radiology Reports ECHOCARDIOGRAM COMPLETE  Result Date: 09/11/2022    ECHOCARDIOGRAM REPORT   Patient Name:   Erin Riley Date of Exam: 09/11/2022 Medical Rec #:  [id number]   Height:       67.0 in Accession #:    [id number]  Weight:       180.0 lb Date of Birth:  09/17/1950   BSA:          1.934 m Patient Age:    72 years    BP:           123/108 mmHg Patient Gender: F           HR:           75 bpm. Exam Location:  Inpatient Procedure: 2D Echo, Cardiac Doppler and Color Doppler Indications:    Congenital Heart Disease  History:        Patient has prior history of Echocardiogram examinations, most                 recent 03/26/2022. CHF, Aortic Valve Disease; Risk  Factors:Hypertension and Dyslipidemia.  Sonographer:    Raeford Razor Referring Phys: [id number] SUBRINA SUNDIL IMPRESSIONS  1. Left ventricular ejection fraction, by estimation, is 40 to 45%. The left ventricle has mildly decreased function. The left ventricle demonstrates global hypokinesis. There is severe concentric left ventricular hypertrophy. Left ventricular diastolic  parameters are consistent  with Grade I diastolic dysfunction (impaired relaxation). Would consider cardiac amyloidosis based on appearance of LV.  2. Right ventricular systolic function is normal. The right ventricular size is normal. Tricuspid regurgitation signal is inadequate for assessing PA pressure.  3. Left atrial size was mildly dilated.  4. The mitral valve is normal in structure. No evidence of mitral valve regurgitation. No evidence of mitral stenosis.  5. The aortic valve is tricuspid. There is severe calcifcation of the aortic valve. Aortic valve regurgitation is mild. Severe aortic valve stenosis. Aortic valve mean gradient measures 37.0 mmHg with AVA 0.84 cm^2.  6. The inferior vena cava is normal in size with greater than 50% respiratory variability, suggesting right atrial pressure of 3 mmHg.  7. Aortic dilatation noted. There is mild dilatation of the ascending aorta, measuring 39 mm. FINDINGS  Left Ventricle: Left ventricular ejection fraction, by estimation, is 40 to 45%. The left ventricle has mildly decreased function. The left ventricle demonstrates global hypokinesis. The left ventricular internal cavity size was normal in size. There is  severe concentric left ventricular hypertrophy. Left ventricular diastolic parameters are consistent with Grade I diastolic dysfunction (impaired relaxation). Right Ventricle: The right ventricular size is normal. No increase in right ventricular wall thickness. Right ventricular systolic function is normal. Tricuspid regurgitation signal is inadequate for assessing PA pressure. Left Atrium: Left atrial size was mildly dilated. Right Atrium: Right atrial size was normal in size. Pericardium: There is no evidence of pericardial effusion. Mitral Valve: The mitral valve is normal in structure. There is mild calcification of the mitral valve leaflet(s). No evidence of mitral valve regurgitation. No evidence of mitral valve stenosis. Tricuspid Valve: The tricuspid valve is normal in  structure. Tricuspid valve regurgitation is mild. Aortic Valve: The aortic valve is tricuspid. There is severe calcifcation of the aortic valve. Aortic valve regurgitation is mild. Severe aortic stenosis is present. Aortic valve mean gradient measures 37.0 mmHg. Aortic valve peak gradient measures 133.2  mmHg. Aortic valve area, by VTI measures 0.61 cm. Pulmonic Valve: The pulmonic valve was normal in structure. Pulmonic valve regurgitation is not visualized. Aorta: Aortic dilatation noted. There is mild dilatation of the ascending aorta, measuring 39 mm. Venous: The inferior vena cava is normal in size with greater than 50% respiratory variability, suggesting right atrial pressure of 3 mmHg. IAS/Shunts: No atrial level shunt detected by color flow Doppler.  LEFT VENTRICLE PLAX 2D LVIDd:         5.60 cm      Diastology LVIDs:         4.80 cm      LV e' medial:    1.85 cm/s LV PW:         1.50 cm      LV E/e' medial:  25.2 LV IVS:        1.50 cm      LV e' lateral:   8.05 cm/s LVOT diam:     2.00 cm      LV E/e' lateral: 5.8 LV SV:         64 LV SV Index:   33 LVOT Area:     3.14 cm  LV  Volumes (MOD) LV vol d, MOD A2C: 186.0 ml LV vol d, MOD A4C: 159.0 ml LV vol s, MOD A2C: 131.0 ml LV vol s, MOD A4C: 115.0 ml LV SV MOD A2C:     55.0 ml LV SV MOD A4C:     159.0 ml LV SV MOD BP:      48.1 ml RIGHT VENTRICLE             IVC RV Basal diam:  2.80 cm     IVC diam: 1.70 cm RV S prime:     14.90 cm/s TAPSE (M-mode): 2.8 cm LEFT ATRIUM              Index        RIGHT ATRIUM           Index LA diam:        4.00 cm  2.07 cm/m   RA Area:     10.30 cm LA Vol (A2C):   101.0 ml 52.23 ml/m  RA Volume:   18.50 ml  9.57 ml/m LA Vol (A4C):   47.1 ml  24.36 ml/m LA Biplane Vol: 68.6 ml  35.48 ml/m  AORTIC VALVE AV Area (Vmax):    0.57 cm AV Area (Vmean):   0.53 cm AV Area (VTI):     0.61 cm AV Vmax:           577.00 cm/s AV Vmean:          406.000 cm/s AV VTI:            1.050 m AV Peak Grad:      133.2 mmHg AV Mean Grad:       37.0 mmHg LVOT Vmax:         105.00 cm/s LVOT Vmean:        68.300 cm/s LVOT VTI:          0.204 m LVOT/AV VTI ratio: 0.19  AORTA Ao Root diam: 3.50 cm Ao Asc diam:  3.90 cm MITRAL VALVE MV Area (PHT): 3.53 cm     SHUNTS MV Decel Time: 215 msec     Systemic VTI:  0.20 m MV E velocity: 46.70 cm/s   Systemic Diam: 2.00 cm MV A velocity: 110.00 cm/s MV E/A ratio:  0.42 Dalton McleanMD Electronically signed by Wilfred Lacy Signature Date/Time: 09/11/2022/5:33:55 PM    Final    CT Angio Chest/Abd/Pel for Dissection W and/or Wo Contrast  Result Date: 09/09/2022 CLINICAL DATA:  Acute aortic syndrome suspected EXAM: CT ANGIOGRAPHY CHEST, ABDOMEN AND PELVIS TECHNIQUE: Non-contrast CT of the chest was initially obtained. Multidetector CT imaging through the chest, abdomen and pelvis was performed using the standard protocol during bolus administration of intravenous contrast. Multiplanar reconstructed images and MIPs were obtained and reviewed to evaluate the vascular anatomy. RADIATION DOSE REDUCTION: This exam was performed according to the departmental dose-optimization program which includes automated exposure control, adjustment of the mA and/or kV according to patient size and/or use of iterative reconstruction technique. CONTRAST:  75mL OMNIPAQUE IOHEXOL 350 MG/ML SOLN COMPARISON:  07/19/2022, 01/17/2019 FINDINGS: CTA CHEST FINDINGS VASCULAR Aorta: Satisfactory opacification of the aorta. Unchanged contour and caliber of the thoracic aorta, tubular ascending thoracic aorta measuring up to 3.9 x 3.8 cm. Unchanged enlargement of the descending thoracic aorta, notable for a thrombosed focal aneurysm or nonacute penetrating at the left aspect of the mid descending vessel, maximum caliber of the vessel at this level 5.5 x 4.1 cm (series 7, image 76). Additional unchanged chronic, thrombosed  focal aneurysm or penetrating ulceration of the level of the diaphragmatic hiatus, measuring up to 5.1 x 4.2 cm (series 7, image  136). Severe mixed calcific atherosclerosis Cardiovascular: No evidence of pulmonary embolism on limited non-tailored examination. Cardiomegaly. No pericardial effusion. Review of the MIP images confirms the above findings. NON VASCULAR Mediastinum/Nodes: No enlarged mediastinal, hilar, or axillary lymph nodes. Thyroid gland, trachea, and esophagus demonstrate no significant findings. Lungs/Pleura: Lungs are clear. No pleural effusion or pneumothorax. Musculoskeletal: No chest wall abnormality. No acute osseous findings. Review of the MIP images confirms the above findings. CTA ABDOMEN AND PELVIS FINDINGS VASCULAR Unchanged contour and caliber of the abdominal aorta. Severe mixed atherosclerosis with fusiform aneurysm of the upper abdominal aorta, contiguous with aneurysmal dilatation described above, without aneurysm of the infrarenal vessel. No evidence of dissection or other acute aortic pathology. Standard branching pattern of the abdominal aorta with solitary bilateral renal arteries. Review of the MIP images confirms the above findings. NON-VASCULAR Hepatobiliary: No solid liver abnormality is seen. No gallstones, gallbladder wall thickening, or biliary dilatation. Pancreas: Unremarkable. No pancreatic ductal dilatation or surrounding inflammatory changes. Spleen: Normal in size without significant abnormality. Adrenals/Urinary Tract: Adrenal glands are unremarkable. Kidneys are normal, without renal calculi, solid lesion, or hydronephrosis. Bladder is unremarkable. Stomach/Bowel: Stomach is within normal limits. Appendix appears normal. No evidence of bowel wall thickening, distention, or inflammatory changes. Descending and sigmoid diverticulosis. Lymphatic: No enlarged abdominal or pelvic lymph nodes. Reproductive: No mass. Similar loculated fluid or cystic lesion of the right ovary or adnexa measuring 4.8 x 2.5 cm, stable over numerous prior examinations dating back to at least 2020 and presumed benign  (series 7, image 263) Other: No abdominal wall hernia or abnormality. No ascites. Musculoskeletal: No acute osseous findings. IMPRESSION: 1. Unchanged enlargement of the descending thoracic aorta, notable for a thrombosed focal aneurysm or nonacute penetrating ulceration at the left aspect of the mid descending vessel, maximum caliber of the vessel at this level 5.5 x 4.1 cm. 2. 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 3. 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 4. No evidence of dissection or other acute aortic pathology. 5. Severe mixed atherosclerosis throughout. 6. No acute findings. Aortic Atherosclerosis (ICD10-I70.0). Electronically Signed   By: Jearld Lesch M.D.   On: 09/09/2022 17:42      Signature  -   Susa Raring M.D on 09/12/2022 at 8:12 AM   -  To page go to [email or web address]

## 2022-09-12 NOTE — Consult Note (Signed)
Brief Psychiatry Consult Note  Spoke to Dr. Thedore Mins - pt not medically stable today and will not leave hospital no matter results of psych eval. Will re-evaluate pt tomorrow rather than today (this gives her another day to respond to previously effective zyprexa).   Earle Burson A Farrah Skoda

## 2022-09-12 NOTE — Progress Notes (Signed)
  Progress Note    09/12/2022 2:19 PM * No surgery found *  Subjective: Denies overnight issues  Vitals:   09/12/22 0855 09/12/22 1251  BP: 136/64 (!) 108/57  Pulse: 61 69  Resp: 13   Temp:    SpO2: 94% 95%    Physical Exam: Awake and alert Nonlabored respirations Abdomen is soft and nontender Bilateral lower extremities are warm and well-perfused  CBC    Component Value Date/Time   WBC 5.7 09/12/2022 0303   RBC 4.05 09/12/2022 0303   HGB 12.2 09/12/2022 0303   HGB 10.4 (L) 02/15/2021 1048   HCT 38.7 09/12/2022 0303   HCT 32.5 (L) 02/15/2021 1048   PLT 237 09/12/2022 0303   PLT 360 02/15/2021 1048   MCV 95.6 09/12/2022 0303   MCV 89 02/15/2021 1048   MCH 30.1 09/12/2022 0303   MCHC 31.5 09/12/2022 0303   RDW 13.5 09/12/2022 0303   RDW 12.7 02/15/2021 1048   LYMPHSABS 2.2 09/12/2022 0303   LYMPHSABS 1.8 02/15/2021 1048   MONOABS 0.5 09/12/2022 0303   EOSABS 0.2 09/12/2022 0303   EOSABS 0.3 02/15/2021 1048   BASOSABS 0.0 09/12/2022 0303   BASOSABS 0.0 02/15/2021 1048    BMET    Component Value Date/Time   NA 136 09/12/2022 0303   NA 140 02/15/2021 1048   K 3.9 09/12/2022 0303   CL 101 09/12/2022 0303   CO2 26 09/12/2022 0303   GLUCOSE 104 (H) 09/12/2022 0303   BUN 29 (H) 09/12/2022 0303   BUN 16 02/15/2021 1048   CREATININE 1.50 (H) 09/12/2022 0303   CREATININE 1.21 (H) 06/15/2014 1025   CALCIUM 9.3 09/12/2022 0303   GFRNONAA 37 (L) 09/12/2022 0303   GFRAA 42 (L) 12/09/2019 0031    INR    Component Value Date/Time   INR 1.1 09/12/2022 0303     Intake/Output Summary (Last 24 hours) at 09/12/2022 1419 Last data filed at 09/12/2022 0900 Gross per 24 hour  Intake 720 ml  Output 0 ml  Net 720 ml     Assessment:  72 y.o. female is here with thoracoabdominal aneurysm that remained stable by recent CT scan.  Plan: Follow-up as outpatient to discuss complex endovascular repair.  Khyree Carillo C. Randie Heinz, MD [facility]  [place] Office: [phone] Pager: [phone]  09/12/2022 2:19 PM

## 2022-09-12 NOTE — Care Management Important Message (Signed)
Important Message  Patient Details  Name: Erin Riley MRN: [id number] Date of Birth: 1951/02/15   Medicare Important Message Given:  Yes     Dorena Bodo 09/12/2022, 2:31 PM

## 2022-09-13 DIAGNOSIS — I169 Hypertensive crisis, unspecified: Secondary | ICD-10-CM | POA: Diagnosis not present

## 2022-09-13 DIAGNOSIS — F209 Schizophrenia, unspecified: Secondary | ICD-10-CM | POA: Diagnosis not present

## 2022-09-13 LAB — BASIC METABOLIC PANEL
Anion gap: 8 (ref 5–15)
BUN: 31 mg/dL — ABNORMAL HIGH (ref 8–23)
CO2: 27 mmol/L (ref 22–32)
Calcium: 9.5 mg/dL (ref 8.9–10.3)
Chloride: 102 mmol/L (ref 98–111)
Creatinine, Ser: 1.47 mg/dL — ABNORMAL HIGH (ref 0.44–1.00)
GFR, Estimated: 38 mL/min — ABNORMAL LOW (ref >60)
Glucose, Bld: 119 mg/dL — ABNORMAL HIGH (ref 70–99)
Potassium: 3.5 mmol/L (ref 3.5–5.1)
Sodium: 137 mmol/L (ref 135–145)

## 2022-09-13 LAB — CBC WITH DIFFERENTIAL/PLATELET
Abs Immature Granulocytes: 0.02 10*3/uL (ref 0.00–0.07)
Basophils Absolute: 0 10*3/uL (ref 0.0–0.1)
Basophils Relative: 0 %
Eosinophils Absolute: 0.2 10*3/uL (ref 0.0–0.5)
Eosinophils Relative: 3 %
HCT: 37.7 % (ref 36.0–46.0)
Hemoglobin: 12 g/dL (ref 12.0–15.0)
Immature Granulocytes: 0 %
Lymphocytes Relative: 37 %
Lymphs Abs: 1.9 10*3/uL (ref 0.7–4.0)
MCH: 30.3 pg (ref 26.0–34.0)
MCHC: 31.8 g/dL (ref 30.0–36.0)
MCV: 95.2 fL (ref 80.0–100.0)
Monocytes Absolute: 0.5 10*3/uL (ref 0.1–1.0)
Monocytes Relative: 10 %
Neutro Abs: 2.6 10*3/uL (ref 1.7–7.7)
Neutrophils Relative %: 50 %
Platelets: 223 10*3/uL (ref 150–400)
RBC: 3.96 MIL/uL (ref 3.87–5.11)
RDW: 13.4 % (ref 11.5–15.5)
WBC: 5.2 10*3/uL (ref 4.0–10.5)
nRBC: 0 % (ref 0.0–0.2)

## 2022-09-13 LAB — BRAIN NATRIURETIC PEPTIDE: B Natriuretic Peptide: 67.2 pg/mL (ref 0.0–100.0)

## 2022-09-13 LAB — MAGNESIUM: Magnesium: 2.4 mg/dL (ref 1.7–2.4)

## 2022-09-13 MED ORDER — POTASSIUM CHLORIDE CRYS ER 20 MEQ PO TBCR
40.0000 meq | EXTENDED_RELEASE_TABLET | Freq: Once | ORAL | Status: AC
  Administered 2022-09-13: 40 meq via ORAL
  Filled 2022-09-13: qty 2

## 2022-09-13 NOTE — Plan of Care (Signed)

## 2022-09-13 NOTE — Consult Note (Signed)
Erin Riley Psychiatry Consult Evaluation  Service Date: September 13, 2022 LOS:  LOS: 4 days    Primary Psychiatric Diagnoses  Schizophrenia  Assessment  Erin Riley is a 72 y.o. female admitted medically for 09/09/2022  2:45 PM for back pain. She carries the psychiatric diagnoses of schizophrenia and has a past medical history of CKD, obesity, cardiomyopathy, AAA, CAD, CHF, sinus brady. Psychiatry was consulted for Schizophrenic, having delusions and some hallucinations  by Dr. Thedore Mins.  Patient was not particularly engaged in interview. She did not display overt evidence of delusions, hallucinations, or disorganized thought processes on my brief interview. Per discharge summary on 06/14/2022, she does have some ongoing thought disorganization at baseline. She appears improved compared to serial ED evaluations in April 2024 in which she was speaking nonsensically and had ongoing delusions about being attacked by snakes. She has responded to olanzapine on several occasions; this has already been restarted by the primary team. Current outpatient psychotropic medications include olanzapine  and historically she has had a fair response but very sporadic compliance. On initial examination, patient was not cooperative and will functionally need to be reassessed tomorrow (not medically stable for discharge). Please see plan below for detailed recommendations.   She was put under IVC in the ED for "going in and out of paranoia while in the emergency department and unsafe to medically manage herself at home"  7/10: Patient evaluated today, was more cooperative and willing to participate in the interview; based on prior documentation she appears to be at her psychiatric baseline.  Delusions appear improved. She is tolerating her home dose of Zyprexa, with no evidence of EPS on exam.  Recommend her IVC and 1:1 be discontinued as she does not appeared to exhibit any acute psychiatric disturbances or evidence of  suicidal ideation or homicidal ideation.   Diagnoses:  Active Hospital problems: Principal Problem:   Hypertensive crisis Active Problems:   CKD stage 3a, GFR 45-59 ml/min (HCC)   Obesity (BMI 30-39.9)   Hypertrophic cardiomyopathy (HCC)   Schizophrenia (HCC)   Aneurysm of descending thoracic aorta without rupture (HCC)   History of abdominal aortic aneurysm (AAA)   CAD (coronary artery disease)   Chronic systolic CHF (congestive heart failure) reduced EF 30-35 (HCC)   Sinus bradycardia     Plan   ## Psychiatric Medication Recommendations:  -- continue current zyprexa 10 mg PO QHS  ## Medical Decision Making Capacity:  -- not formally assessed   ## Further Work-up:  -- none currently recommended While pt on Qtc prolonging medications, please monitor & replete K+ to 4 and Mg2+ to 2   -- most recent EKG on 07/07 had QtC of 423 -- Pertinent labwork reviewed earlier this admission includes:  mild anemia, UDS Pending  ## Disposition:  -- Does not appear to require inpt psychiatric hospitalization at this time   ## Behavioral / Environmental:  --   No specific recommendations at this time.   Many ## Safety and Observation Level:  -Discontinue 1:1 level of observation -Recommend discontinuing IVC   Thank you for this consult request. Recommendations have been communicated to the primary team.  We will sign off at this time.   Erin Frederick, MD  Psychiatric and Social History   Relevant Aspects of Hospital Course:  Admitted on 09/09/2022 for BP elevation after presenting to the ED for back pain. They were put under IVC for paranoia in the ED.[name]   Patient Report:  Patient evaluated at bedside.  She initially welcomes this interviewer to sit down and speak with reports she is sleeping and eating well.  Attempted to discuss her social history, to which she answers "that is none of your concern".  On assessment she denies suicidal ideation, homicidal ideation, or  active hallucinations.  She does appear guarded and suspicious of this interviewer.  She denies that she believes I wish her any ill intent, but believes that the questions that are being asked are "unnecessary and unimportant".  There is no evidence of overt delusional thought processes, and made no mention of snakes in the room.  She denies any side effects to currently prescribed psychiatric medications, although she is unsure of why she is taking Zyprexa.  Psych ROS:  Pt did not participate in ROS beyond what was above  Collateral information:  Did not get permission to contact. From ED attending attestation: 7/   Patient had waxing and waning disorganized thoughts and speech while in the ED concerning for decompensated schizophrenia. Although she states that she has been taking her medications, she was subsequently endorsed paranoia regarding medications.   Psychiatric History:  Information collected from patient and medical record  Prev Dx/Sx: schizophrenia Current Psych Provider: none outpt Home Meds (current): zyprexa Previous Med Trials: zyprexa Therapy: unknown  Prior ECT: unknown Prior Psych Hospitalization: yes, multiple  Prior Self Harm: unknown, none documented Prior Violence: none documented   Social History:   Access to weapons: lives in group home    Exam Findings   Psychiatric Specialty Exam:  Presentation  General Appearance: Appropriate for Environment; Casual  Eye Contact:Fair  Speech:Clear and Coherent  Speech Volume:Normal  Handedness:Right   Mood and Affect  Mood:-- ("I'm fine")  Affect:Flat (laugh innapropriately)   Thought Process  Thought Processes:Coherent  Descriptions of Associations:Intact  Orientation:Full (Time, Place and Person)  Thought Content:Illogical; Paranoid Ideation  Hallucinations:Hallucinations: None  Ideas of Reference:None  Suicidal Thoughts:Suicidal Thoughts: No  Homicidal Thoughts:Homicidal Thoughts:  No   Sensorium  Memory:Immediate Poor; Recent Poor; Remote Poor  Judgment:Poor  Insight:Poor   Executive Functions  Concentration:Fair  Attention Span:Fair  Recall:Fair  Fund of Knowledge:Fair  Language:Fair   Psychomotor Activity  Psychomotor Activity:Psychomotor Activity: Normal (No evidence of EPS, reptitive lip smacking stops upon reuqest. No dystonia appreciated on BUE.)   Assets  Assets:Resilience; Communication Skills   Sleep  Sleep:Sleep: Good    Physical Exam: Vital signs:  Temp:  [97.7 F (36.5 C)-98.7 F (37.1 C)] 98.4 F (36.9 C) (07/10 1125) Pulse Rate:  [62-88] 88 (07/10 1125) Resp:  [12-22] 14 (07/10 1125) BP: (108-158)/(49-119) 137/71 (07/10 0738) SpO2:  [91 %-96 %] 96 % (07/10 1138) FiO2 (%):  [96 %] 96 % (07/10 0738) Weight:  [107.5 kg] 107.5 kg (07/10 0319) Physical Exam HENT:     Head: Normocephalic.  Pulmonary:     Effort: Pulmonary effort is normal. No respiratory distress.  Neurological:     Mental Status: She is alert.     Blood pressure 137/71, pulse 88, temperature 98.4 F (36.9 C), temperature source Oral, resp. rate 14, height 5\' 7"  (1.702 m), weight 107.5 kg, last menstrual period 05/10/2013, SpO2 96 %. Body mass index is 37.12 kg/m.   Other History   These have been pulled in through the EMR, reviewed, and updated if appropriate.   Family History:   The patient's family history includes Hypertension in her mother.  Medical History: Past Medical History:  Diagnosis Date   Chronic back pain    Chronic kidney disease (  CKD), stage II (mild)    Class I-II   Coronary artery disease 04/2009   50% stenosis in the perforator of LAD; catheterization was for an abnormal Myoview in January 2000 showing anterior and inferolateral ischemia.   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 > 3 subsequent Echos show  improved/normal EF with moderate to severe concentric LVH and diastolic dysfunction with LVOT/intracavitary gradient --> 06/2016: Severe LVH.  Vigorous EF, 65-70%.?? Gr 1 DD. Mild AS.   Hyperlipidemia    Hypertension    Hypertensive hypertrophic cardiomyopathy: NYHA class II:  Echo: Severe concentric LVH with LV OT gradient; essentially preserved EF with diastolic dysfunction 02/15/2013   Echo 06/2016: Severe Concentric LVH. Vigorous EF 65-70%. ~ Gr I DD.    Mild aortic stenosis by prior echocardiography    Echo 06/2016: Mild AS (Mean Gradient 15 mmHg); has had prior Mod-Severe MR (not seen on current echo)   PAD (peripheral artery disease) (HCC) March 2013   Lower extremity Dopplers: R. SFA 50-60%, R. PTA proximally occluded with distal reconstitution;; L. common iliac ~50%, L. SFA 50-70% stenosis, L. PTA < 50%   Schizophrenia (HCC)     Surgical History: Past Surgical History:  Procedure Laterality Date   BUNIONECTOMY     carotid doppler  05/29/2011   left bulb/prox ICA moderate amtfibrous plaque with no evidence significant reduction.,right bulb /proximal ICA normal patency   lower extremity doppler  05/29/2011   right SFA 50% to 59% diameter reduction,right posterior tibal atreery occlusive disease,reconstituting distally, left common illiac<50%,left SFA 50 to70%,left post. tibial <50%   NM MYOCAR PERF WALL MOTION  03/2009   Persantine; EF 51%-both anterior and inferolateral ischemia   TRANSTHORACIC ECHOCARDIOGRAM  06/2016   Severe LVH.  Vigorous EF of 65-70%.  No RWMA. ~Only grade 1 diastolic dysfunction.  Mild aortic stenosis (mean gradient 15 mmHg)   TRANSTHORACIC ECHOCARDIOGRAM  07/2012   EF 50-55%; severe concentric LVH; only grade 1 diastolic dysfunction. Mild aortic sclerosis - with LVOT /intracavitary gradient of roughly 20 mmHg mean. Mild to moderately dilated LA;; previously reported MR not seen    Medications:   Current Facility-Administered Medications:    acetaminophen  (TYLENOL) tablet 650 mg, 650 mg, Oral, Q6H PRN **OR** acetaminophen (TYLENOL) suppository 650 mg, 650 mg, Rectal, Q6H PRN, Sundil, Subrina, MD   amLODipine (NORVASC) tablet 10 mg, 10 mg, Oral, Daily, Susa Raring K, MD, 10 mg at 09/13/22 8119   aspirin EC tablet 81 mg, 81 mg, Oral, Daily, Sundil, Subrina, MD, 81 mg at 09/13/22 0740   atorvastatin (LIPITOR) tablet 40 mg, 40 mg, Oral, QHS, Sundil, Subrina, MD, 40 mg at 09/12/22 2131   carvedilol (COREG) tablet 6.25 mg, 6.25 mg, Oral, BID WC, Susa Raring K, MD, 6.25 mg at 09/13/22 0739   docusate sodium (COLACE) capsule 100 mg, 100 mg, Oral, BID, Sundil, Subrina, MD, 100 mg at 09/13/22 0739   empagliflozin (JARDIANCE) tablet 10 mg, 10 mg, Oral, Daily, Sundil, Subrina, MD, 10 mg at 09/13/22 0740   ferrous sulfate tablet 325 mg, 325 mg, Oral, Q breakfast, Sundil, Subrina, MD, 325 mg at 09/13/22 0739   furosemide (LASIX) tablet 40 mg, 40 mg, Oral, Daily, Sundil, Subrina, MD, 40 mg at 09/13/22 0739   hydrALAZINE (APRESOLINE) injection 10 mg, 10 mg, Intravenous, Q8H PRN, Sundil, Subrina, MD, 10 mg at 09/10/22 1159   hydrALAZINE (APRESOLINE) tablet 100 mg, 100  mg, Oral, Q8H, Leroy Sea, MD, 100 mg at 09/13/22 0641   isosorbide mononitrate (IMDUR) 24 hr tablet 60 mg, 60 mg, Oral, Daily, Sundil, Subrina, MD, 60 mg at 09/13/22 0740   lidocaine (LIDODERM) 5 % 1 patch, 1 patch, Transdermal, Q24H, Sundil, Subrina, MD, 1 patch at 09/12/22 1630   morphine (PF) 2 MG/ML injection 2 mg, 2 mg, Intravenous, Q2H PRN, Sundil, Subrina, MD, 2 mg at 09/11/22 2318   OLANZapine (ZYPREXA) tablet 10 mg, 10 mg, Oral, QHS, Sundil, Subrina, MD, 10 mg at 09/12/22 2131   ondansetron (ZOFRAN) tablet 4 mg, 4 mg, Oral, Q6H PRN **OR** ondansetron (ZOFRAN) injection 4 mg, 4 mg, Intravenous, Q6H PRN, Janalyn Shy, Subrina, MD  Allergies: No Known Allergies

## 2022-09-13 NOTE — Progress Notes (Signed)
PROGRESS NOTE                                                                                                                                                                                                             Patient Demographics:    Erin Riley, is a 72 y.o. female, DOB - 23-Dec-1950, ZOX:[id number]  Outpatient Primary MD for the patient is Hoy Register, MD    LOS - 4  Admit date - 09/09/2022    Chief Complaint  Patient presents with   Back Pain       Brief Narrative (HPI from H&P)   72 year old female with HTN, hypertrophic cardiomyopathy, chronic systolic and diastolic CHF with EF 25 to 30%, G2 DD, PAD, CKD stage IIIa, hyperlipidemia, schizophrenia, AAA presented to ED from group home with left-sided lower back pain. Patient's symptoms started 3 days prior to admission, denied any fall. Reported back pain is nonradiating, no chest pain, palpitations, blurry vision, headache. Lives in a group home, noncompliant with her BP medications.  He was diagnosed with uncontrolled hypertension, delusions and admitted to the hospital.   Subjective:   Patient in bed, appears comfortable, denies any headache, no fever, no chest pain or pressure, no shortness of breath , no abdominal pain. No new focal weakness.    Assessment  & Plan :    Nonspecific chest pain/back pain in a patient with history of chronic descending thoracic aorta aneurysm, noncompliance with medications.  Symptoms due to hypertensive crisis. She admitted to the hospital, counseled on compliance, seen by vascular surgery, her aneurysm is chronic and for now medical management only per vascular surgery with outpatient follow-up with Dr. Randie Heinz postdischarge, blood pressure medications adjusted for better control, pain has resolved continue to monitor.  Hypertrophic cardiomyopathy.  Had sinus bradycardia upon admission.  Will try and better control her blood  pressure, blood pressure medications adjusted further on 09/12/2022, Coreg dose reduced to less than half of home dose, monitor heart rate, stable TSH.    CAD with chronic systolic heart failure EF improved EF 35% >> 45%.  Currently compensated, continue aspirin, statin, low-dose beta-blocker, avoiding ACE/ARB due to underlying renal dysfunction, TTE noted will require outpatient cardiac MRI and cardiology follow-up.  CKD stage 3A. Baseline creatinine around 1.5.  Continue to monitor.  Had mild AKI which has resolved.  Schizophrenia.  Lives at a group home, question compliance with her home Zyprexa, home medication continued, delusions slightly improved on 09/12/2022, psych to evaluate.      Condition - Fair  Family Communication  :  None  Code Status :  Full  Consults  :  VVS, Psych  PUD Prophylaxis :     Procedures  :     TTE - 1. Left ventricular ejection fraction, by estimation, is 40 to 45%. The left ventricle has mildly decreased function. The left ventricle demonstrates global hypokinesis. There is severe concentric left ventricular hypertrophy. Left ventricular diastolic  parameters are consistent with Grade I diastolic dysfunction (impaired relaxation). Would consider cardiac amyloidosis based on appearance of LV.  2. Right ventricular systolic function is normal. The right ventricular size is normal. Tricuspid regurgitation signal is inadequate for assessing PA pressure.  3. Left atrial size was mildly dilated.  4. The mitral valve is normal in structure. No evidence of mitral valve regurgitation. No evidence of mitral stenosis.  5. The aortic valve is tricuspid. There is severe calcifcation of the aortic valve. Aortic valve regurgitation is mild. Severe aortic valve stenosis. Aortic valve mean gradient measures 37.0 mmHg with AVA 0.84 cm^2.  6. The inferior vena cava is normal in size with greater than 50% respiratory variability, suggesting right atrial pressure of 3 mmHg.  7. Aortic  dilatation noted. There is mild dilatation of the ascending aorta, measuring 39 mm.  CT - 1. Unchanged enlargement of the descending thoracic aorta, notable for a thrombosed focal aneurysm or nonacute penetrating ulceration at the left aspect of the mid descending vessel, maximum caliber of the vessel at this level 5.5 x 4.1 cm. 2. 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 3. 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 4. No evidence of dissection or other acute aortic pathology. 5. Severe mixed atherosclerosis throughout. 6. No acute findings. Aortic Atherosclerosis (ICD10-I70.0).      Disposition Plan  :    Status is: Inpatient  DVT Prophylaxis  :    SCDs Start: 09/09/22 2254    Lab Results  Component Value Date   PLT 223 09/13/2022    Diet :  Diet Order             Diet Heart Room service appropriate? Yes; Fluid consistency: Thin  Diet effective now                    Inpatient Medications  Scheduled Meds:  amLODipine  10 mg Oral Daily   aspirin EC  81 mg Oral Daily   atorvastatin  40 mg Oral QHS   carvedilol  6.25 mg Oral BID WC   docusate sodium  100 mg Oral BID   empagliflozin  10 mg Oral Daily   ferrous sulfate  325 mg Oral Q breakfast   furosemide  40 mg Oral Daily   hydrALAZINE  100 mg Oral Q8H   isosorbide mononitrate  60 mg Oral Daily   lidocaine  1 patch Transdermal Q24H   OLANZapine  10 mg Oral QHS   Continuous Infusions: PRN Meds:.acetaminophen **OR** acetaminophen, hydrALAZINE, morphine injection, ondansetron **OR** ondansetron (ZOFRAN) IV  Antibiotics  :    Anti-infectives (From admission, onward)    None        Objective:   Vitals:   09/12/22 2100  09/12/22 2325 09/13/22 0319 09/13/22 0738  BP: (!) 139/119 (!) 138/49 (!)  158/118 137/71  Pulse: 68 77 64 65  Resp: 19 (!) 21 17 20   Temp: 98.7 F (37.1 C)     TempSrc: Oral     SpO2: 95% 95% 93% 96%  Weight:   107.5 kg   Height:        Wt Readings from Last 3 Encounters:  09/13/22 107.5 kg  07/19/22 102 kg  06/06/22 101.8 kg     Intake/Output Summary (Last 24 hours) at 09/13/2022 0855 Last data filed at 09/13/2022 0756 Gross per 24 hour  Intake 960 ml  Output --  Net 960 ml     Physical Exam  Awake Alert, No new F.N deficits, Normal affect [place].AT,PERRAL Supple Neck, No JVD,   Symmetrical Chest wall movement, Good air movement bilaterally, CTAB RRR,No Gallops,Rubs or new Murmurs,  +ve B.Sounds, Abd Soft, No tenderness,   No Cyanosis, Clubbing or edema     Data Review:    Recent Labs  Lab 09/09/22 1505 09/09/22 1518 09/10/22 0032 09/12/22 0303 09/13/22 0154  WBC 5.0  --  4.8 5.7 5.2  HGB 13.1 14.3 11.8* 12.2 12.0  HCT 41.3 42.0 37.1 38.7 37.7  PLT 265  --  241 237 223  MCV 94.9  --  94.6 95.6 95.2  MCH 30.1  --  30.1 30.1 30.3  MCHC 31.7  --  31.8 31.5 31.8  RDW 13.7  --  13.6 13.5 13.4  LYMPHSABS 1.8  --   --  2.2 1.9  MONOABS 0.4  --   --  0.5 0.5  EOSABS 0.2  --   --  0.2 0.2  BASOSABS 0.0  --   --  0.0 0.0    Recent Labs  Lab 09/09/22 1505 09/09/22 1518 09/10/22 0032 09/12/22 0303 09/13/22 0154  NA 138 139 136 136 137  K 4.6 4.7 4.3 3.9 3.5  CL 104 107 105 101 102  CO2 25  --  25 26 27   ANIONGAP 9  --  6 9 8   GLUCOSE 129* 129* 101* 104* 119*  BUN 22 26* 19 29* 31*  CREATININE 1.67* 1.60* 1.40* 1.50* 1.47*  AST  --   --  16  --   --   ALT  --   --  16  --   --   ALKPHOS  --   --  68  --   --   BILITOT  --   --  0.8  --   --   ALBUMIN  --   --  3.1*  --   --   INR  --   --   --  1.1  --   TSH 0.515  --   --   --   --   BNP  --   --   --  90.5 67.2  MG  --   --   --  2.3 2.4  CALCIUM 9.6  --  9.0 9.3 9.5      Recent Labs  Lab 09/09/22 1505 09/10/22 0032 09/12/22 0303 09/13/22 0154  INR  --   --   1.1  --   TSH 0.515  --   --   --   BNP  --   --  90.5 67.2  MG  --   --  2.3 2.4  CALCIUM 9.6 9.0 9.3 9.5   Lab Results  Component Value Date   CHOL 147 03/27/2022  HDL 37 (L) 03/27/2022   LDLCALC 92 03/27/2022   TRIG 89 03/27/2022   CHOLHDL 4.0 03/27/2022    Lab Results  Component Value Date   HGBA1C 5.8 (H) 06/10/2022    No results for input(s): "TSH", "T4TOTAL", "T3FREE", "THYROIDAB" in the last 72 hours.  Invalid input(s): "FREET3"    Radiology Reports ECHOCARDIOGRAM COMPLETE  Result Date: 09/11/2022    ECHOCARDIOGRAM REPORT   Patient Name:   DAVI ROTAN Demonte Date of Exam: 09/11/2022 Medical Rec #:  [id number]   Height:       67.0 in Accession #:    [id number]  Weight:       180.0 lb Date of Birth:  06/08/1950   BSA:          1.934 m Patient Age:    72 years    BP:           123/108 mmHg Patient Gender: F           HR:           75 bpm. Exam Location:  Inpatient Procedure: 2D Echo, Cardiac Doppler and Color Doppler Indications:    Congenital Heart Disease  History:        Patient has prior history of Echocardiogram examinations, most                 recent 03/26/2022. CHF, Aortic Valve Disease; Risk                 Factors:Hypertension and Dyslipidemia.  Sonographer:    Raeford Razor Referring Phys: [id number] SUBRINA SUNDIL IMPRESSIONS  1. Left ventricular ejection fraction, by estimation, is 40 to 45%. The left ventricle has mildly decreased function. The left ventricle demonstrates global hypokinesis. There is severe concentric left ventricular hypertrophy. Left ventricular diastolic  parameters are consistent with Grade I diastolic dysfunction (impaired relaxation). Would consider cardiac amyloidosis based on appearance of LV.  2. Right ventricular systolic function is normal. The right ventricular size is normal. Tricuspid regurgitation signal is inadequate for assessing PA pressure.  3. Left atrial size was mildly dilated.  4. The mitral valve is normal in structure. No evidence of mitral  valve regurgitation. No evidence of mitral stenosis.  5. The aortic valve is tricuspid. There is severe calcifcation of the aortic valve. Aortic valve regurgitation is mild. Severe aortic valve stenosis. Aortic valve mean gradient measures 37.0 mmHg with AVA 0.84 cm^2.  6. The inferior vena cava is normal in size with greater than 50% respiratory variability, suggesting right atrial pressure of 3 mmHg.  7. Aortic dilatation noted. There is mild dilatation of the ascending aorta, measuring 39 mm. FINDINGS  Left Ventricle: Left ventricular ejection fraction, by estimation, is 40 to 45%. The left ventricle has mildly decreased function. The left ventricle demonstrates global hypokinesis. The left ventricular internal cavity size was normal in size. There is  severe concentric left ventricular hypertrophy. Left ventricular diastolic parameters are consistent with Grade I diastolic dysfunction (impaired relaxation). Right Ventricle: The right ventricular size is normal. No increase in right ventricular wall thickness. Right ventricular systolic function is normal. Tricuspid regurgitation signal is inadequate for assessing PA pressure. Left Atrium: Left atrial size was mildly dilated. Right Atrium: Right atrial size was normal in size. Pericardium: There is no evidence of pericardial effusion. Mitral Valve: The mitral valve is normal in structure. There is mild calcification of the mitral valve leaflet(s). No evidence of mitral valve regurgitation. No evidence of mitral valve stenosis. Tricuspid Valve:  The tricuspid valve is normal in structure. Tricuspid valve regurgitation is mild. Aortic Valve: The aortic valve is tricuspid. There is severe calcifcation of the aortic valve. Aortic valve regurgitation is mild. Severe aortic stenosis is present. Aortic valve mean gradient measures 37.0 mmHg. Aortic valve peak gradient measures 133.2  mmHg. Aortic valve area, by VTI measures 0.61 cm. Pulmonic Valve: The pulmonic valve  was normal in structure. Pulmonic valve regurgitation is not visualized. Aorta: Aortic dilatation noted. There is mild dilatation of the ascending aorta, measuring 39 mm. Venous: The inferior vena cava is normal in size with greater than 50% respiratory variability, suggesting right atrial pressure of 3 mmHg. IAS/Shunts: No atrial level shunt detected by color flow Doppler.  LEFT VENTRICLE PLAX 2D LVIDd:         5.60 cm      Diastology LVIDs:         4.80 cm      LV e' medial:    1.85 cm/s LV PW:         1.50 cm      LV E/e' medial:  25.2 LV IVS:        1.50 cm      LV e' lateral:   8.05 cm/s LVOT diam:     2.00 cm      LV E/e' lateral: 5.8 LV SV:         64 LV SV Index:   33 LVOT Area:     3.14 cm  LV Volumes (MOD) LV vol d, MOD A2C: 186.0 ml LV vol d, MOD A4C: 159.0 ml LV vol s, MOD A2C: 131.0 ml LV vol s, MOD A4C: 115.0 ml LV SV MOD A2C:     55.0 ml LV SV MOD A4C:     159.0 ml LV SV MOD BP:      48.1 ml RIGHT VENTRICLE             IVC RV Basal diam:  2.80 cm     IVC diam: 1.70 cm RV S prime:     14.90 cm/s TAPSE (M-mode): 2.8 cm LEFT ATRIUM              Index        RIGHT ATRIUM           Index LA diam:        4.00 cm  2.07 cm/m   RA Area:     10.30 cm LA Vol (A2C):   101.0 ml 52.23 ml/m  RA Volume:   18.50 ml  9.57 ml/m LA Vol (A4C):   47.1 ml  24.36 ml/m LA Biplane Vol: 68.6 ml  35.48 ml/m  AORTIC VALVE AV Area (Vmax):    0.57 cm AV Area (Vmean):   0.53 cm AV Area (VTI):     0.61 cm AV Vmax:           577.00 cm/s AV Vmean:          406.000 cm/s AV VTI:            1.050 m AV Peak Grad:      133.2 mmHg AV Mean Grad:      37.0 mmHg LVOT Vmax:         105.00 cm/s LVOT Vmean:        68.300 cm/s LVOT VTI:          0.204 m LVOT/AV VTI ratio: 0.19  AORTA Ao Root diam: 3.50 cm Ao Asc diam:  3.90 cm  MITRAL VALVE MV Area (PHT): 3.53 cm     SHUNTS MV Decel Time: 215 msec     Systemic VTI:  0.20 m MV E velocity: 46.70 cm/s   Systemic Diam: 2.00 cm MV A velocity: 110.00 cm/s MV E/A ratio:  0.42 Dalton McleanMD  Electronically signed by Wilfred Lacy Signature Date/Time: 09/11/2022/5:33:55 PM    Final    CT Angio Chest/Abd/Pel for Dissection W and/or Wo Contrast  Result Date: 09/09/2022 CLINICAL DATA:  Acute aortic syndrome suspected EXAM: CT ANGIOGRAPHY CHEST, ABDOMEN AND PELVIS TECHNIQUE: Non-contrast CT of the chest was initially obtained. Multidetector CT imaging through the chest, abdomen and pelvis was performed using the standard protocol during bolus administration of intravenous contrast. Multiplanar reconstructed images and MIPs were obtained and reviewed to evaluate the vascular anatomy. RADIATION DOSE REDUCTION: This exam was performed according to the departmental dose-optimization program which includes automated exposure control, adjustment of the mA and/or kV according to patient size and/or use of iterative reconstruction technique. CONTRAST:  75mL OMNIPAQUE IOHEXOL 350 MG/ML SOLN COMPARISON:  07/19/2022, 01/17/2019 FINDINGS: CTA CHEST FINDINGS VASCULAR Aorta: Satisfactory opacification of the aorta. Unchanged contour and caliber of the thoracic aorta, tubular ascending thoracic aorta measuring up to 3.9 x 3.8 cm. Unchanged enlargement of the descending thoracic aorta, notable for a thrombosed focal aneurysm or nonacute penetrating at the left aspect of the mid descending vessel, maximum caliber of the vessel at this level 5.5 x 4.1 cm (series 7, image 76). 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 Severe mixed calcific atherosclerosis Cardiovascular: No evidence of pulmonary embolism on limited non-tailored examination. Cardiomegaly. No pericardial effusion. Review of the MIP images confirms the above findings. NON VASCULAR Mediastinum/Nodes: No enlarged mediastinal, hilar, or axillary lymph nodes. Thyroid gland, trachea, and esophagus demonstrate no significant findings.  Lungs/Pleura: Lungs are clear. No pleural effusion or pneumothorax. Musculoskeletal: No chest wall abnormality. No acute osseous findings. Review of the MIP images confirms the above findings. CTA ABDOMEN AND PELVIS FINDINGS VASCULAR Unchanged contour and caliber of the abdominal aorta. Severe mixed atherosclerosis with fusiform aneurysm of the upper abdominal aorta, contiguous with aneurysmal dilatation described above, without aneurysm of the infrarenal vessel. No evidence of dissection or other acute aortic pathology. Standard branching pattern of the abdominal aorta with solitary bilateral renal arteries. Review of the MIP images confirms the above findings. NON-VASCULAR Hepatobiliary: No solid liver abnormality is seen. No gallstones, gallbladder wall thickening, or biliary dilatation. Pancreas: Unremarkable. No pancreatic ductal dilatation or surrounding inflammatory changes. Spleen: Normal in size without significant abnormality. Adrenals/Urinary Tract: Adrenal glands are unremarkable. Kidneys are normal, without renal calculi, solid lesion, or hydronephrosis. Bladder is unremarkable. Stomach/Bowel: Stomach is within normal limits. Appendix appears normal. No evidence of bowel wall thickening, distention, or inflammatory changes. Descending and sigmoid diverticulosis. Lymphatic: No enlarged abdominal or pelvic lymph nodes. Reproductive: No mass. Similar loculated fluid or cystic lesion of the right ovary or adnexa measuring 4.8 x 2.5 cm, stable over numerous prior examinations dating back to at least 2020 and presumed benign (series 7, image 263) Other: No abdominal wall hernia or abnormality. No ascites. Musculoskeletal: No acute osseous findings. IMPRESSION: 1. Unchanged enlargement of the descending thoracic aorta, notable for a thrombosed focal aneurysm or nonacute penetrating ulceration at the left aspect of the mid descending vessel, maximum caliber of the vessel at this level 5.5 x 4.1 cm. 2.  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 3. 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 4. No evidence of dissection or other acute aortic pathology. 5. Severe mixed atherosclerosis throughout. 6. No acute findings. Aortic Atherosclerosis (ICD10-I70.0). Electronically Signed   By: Jearld Lesch M.D.   On: 09/09/2022 17:42      Signature  -   Susa Raring M.D on 09/13/2022 at 8:55 AM   -  To page go to [email or web address]

## 2022-09-14 ENCOUNTER — Other Ambulatory Visit (HOSPITAL_COMMUNITY): Payer: Self-pay

## 2022-09-14 DIAGNOSIS — I169 Hypertensive crisis, unspecified: Secondary | ICD-10-CM | POA: Diagnosis not present

## 2022-09-14 MED ORDER — EMPAGLIFLOZIN 10 MG PO TABS
10.0000 mg | ORAL_TABLET | Freq: Every day | ORAL | 0 refills | Status: DC
  Filled 2022-09-14: qty 30, 30d supply, fill #0

## 2022-09-14 MED ORDER — OLANZAPINE 10 MG PO TABS
10.0000 mg | ORAL_TABLET | Freq: Every day | ORAL | 0 refills | Status: DC
  Filled 2022-09-14: qty 30, 30d supply, fill #0

## 2022-09-14 MED ORDER — HYDRALAZINE HCL 100 MG PO TABS
100.0000 mg | ORAL_TABLET | Freq: Three times a day (TID) | ORAL | 0 refills | Status: DC
  Filled 2022-09-14: qty 90, 30d supply, fill #0

## 2022-09-14 MED ORDER — CARVEDILOL 6.25 MG PO TABS
6.2500 mg | ORAL_TABLET | Freq: Two times a day (BID) | ORAL | 0 refills | Status: DC
  Filled 2022-09-14: qty 60, 30d supply, fill #0

## 2022-09-14 MED ORDER — ASPIRIN 81 MG PO TBEC
81.0000 mg | DELAYED_RELEASE_TABLET | Freq: Every day | ORAL | 0 refills | Status: DC
  Filled 2022-09-14: qty 120, 120d supply, fill #0

## 2022-09-14 MED ORDER — FERROUS SULFATE 325 (65 FE) MG PO TABS
325.0000 mg | ORAL_TABLET | Freq: Every day | ORAL | 0 refills | Status: AC
  Filled 2022-09-14: qty 100, 100d supply, fill #0

## 2022-09-14 MED ORDER — AMLODIPINE BESYLATE 10 MG PO TABS
10.0000 mg | ORAL_TABLET | Freq: Every day | ORAL | 0 refills | Status: DC
  Filled 2022-09-14: qty 30, 30d supply, fill #0

## 2022-09-14 NOTE — TOC Transition Note (Addendum)
Transition of Care [facility]) - CM/SW Discharge Note   Patient Details  Name: Erin Riley MRN: [id number] Date of Birth: 25-Apr-1950  Transition of Care [facility]) CM/SW Contact:  Tom-Johnson, Hershal Coria, RN Phone Number: 09/14/2022, 10:25 AM   Clinical Narrative:     Patient is scheduled for discharge today.  Readmission Risk Assessment done. Home health referral, hospital f/u and discharge instructions on AVS. Prescriptions sent to [facility] pharmacy and meds will be delivered to patient at bedside prior discharge. Bus pass given to patient for transportation at discharge.  No further TOC needs noted.  12:00- CM informed by RN that patient returned bus pass stating that the Bus does not go to where she lives.  Patient requested for cab at discharge. Therapist, nutritional and Release Of Liability form explained to patient with understanding verbalized, form signed and placed in patient's chart. Cab voucher given to RN.  No further TOC needs noted.     Final next level of care: Home w Home Health Services Barriers to Discharge: Barriers Resolved   Patient Goals and CMS Choice CMS Medicare.gov Compare Post Acute Care list provided to:: Patient Choice offered to / list presented to : Patient  Discharge Placement                  Patient to be transferred to facility by: [facility]      Discharge Plan and Services Additional resources added to the After Visit Summary for                            [facility] Arranged: PT, RN [facility] Agency: [facility] Date [facility] Agency Contacted: 09/14/22 Time HH Agency Contacted: 1015 Representative spoke with at [facility] Agency: Kandee Keen  Social Determinants of Health (SDOH) Interventions SDOH Screenings   Food Insecurity: No Food Insecurity (09/12/2022)  Housing: Low Risk  (09/12/2022)  Transportation Needs: No Transportation Needs (09/12/2022)  Utilities: Not At Risk (09/12/2022)  Alcohol Screen: Low Risk  (06/06/2022)  Depression (PHQ2-9): Low Risk   (01/26/2021)  Tobacco Use: High Risk (09/09/2022)     Readmission Risk Interventions    09/14/2022   10:20 AM  Readmission Risk Prevention Plan  Transportation Screening Complete  PCP or Specialist Appt within 3-5 Days Complete  HRI or Home Care Consult Complete  Social Work Consult for Recovery Care Planning/Counseling Complete  Palliative Care Screening Not Applicable  Medication Review [facility]) Referral to Pharmacy

## 2022-09-14 NOTE — Progress Notes (Signed)
PT Cancellation Note  Patient Details Name: Erin Riley MRN: [id number] DOB: 06/15/1950   Cancelled Treatment:    Reason Eval/Treat Not Completed: Patient declined, no reason specified  Declines to participate with physical therapy today. States she is ready to go home. Will follow and work with pt as she is willing.   Kathlyn Sacramento, PT, DPT [facility]  Rehabilitation Services Physical Therapist Office: [phone] Website: [email or web address]

## 2022-09-14 NOTE — Discharge Summary (Signed)
Erin Riley:[id number] DOB: September 14, 1950 DOA: 09/09/2022  PCP: Hoy Register, MD  Admit date: 09/09/2022  Discharge date: 09/14/2022  Admitted From: Group home   Disposition:  Group home    Recommendations for Outpatient Follow-up:   Follow up with PCP in 1-2 weeks  PCP Please obtain BMP/CBC, 2 view CXR in 1week,  (see Discharge instructions)   PCP Please follow up on the following pending results: Only arrange for close outpatient psych, vascular surgery and cardiology follow-up in 7 to 10 days, monitor blood pressure closely.   Home Health: None   Equipment/Devices: None  Consultations: Psych Discharge Condition: Stable    CODE STATUS: Full    Diet Recommendation: Heart Healthy     Chief Complaint  Patient presents with   Back Pain     Brief history of present illness from the day of admission and additional interim summary    72 year old female with HTN, hypertrophic cardiomyopathy, chronic systolic and diastolic CHF with EF 25 to 30%, G2 DD, PAD, CKD stage IIIa, hyperlipidemia, schizophrenia, AAA presented to ED from group home with left-sided lower back pain. Patient's symptoms started 3 days prior to admission, denied any fall. Reported back pain is nonradiating, no chest pain, palpitations, blurry vision, headache. Lives in a group home, noncompliant with her BP medications.  He was diagnosed with uncontrolled hypertension, delusions and admitted to the hospital.                                                                  Hospital Course   Nonspecific chest pain/back pain in a patient with history of chronic descending thoracic aorta aneurysm, noncompliance with medications.  Symptoms due to hypertensive crisis. She admitted to the hospital, counseled on compliance, seen by vascular surgery, her  aneurysm is chronic and for now medical management only per vascular surgery with outpatient follow-up with Dr. Randie Heinz postdischarge, blood pressure medications adjusted for better control, pain has resolved, blood pressure stable patient counseled on compliance.  Will be discharged on present blood pressure regimen with outpatient follow-up with cardiology, Dr. Randie Heinz and psych.   Hypertrophic cardiomyopathy.  Had sinus bradycardia upon admission.  Will try and better control her blood pressure, blood pressure medications adjusted further on 09/12/2022, Coreg dose reduced to less than half of home dose, monitor heart rate, stable TSH.     CAD with chronic systolic heart failure EF improved EF 35% >> 45%.  Currently compensated, continue aspirin, statin, low-dose beta-blocker, avoiding ACE/ARB due to underlying renal dysfunction, TTE noted will require outpatient cardiac MRI and cardiology follow-up.   CKD stage 3A. Baseline creatinine around 1.5.  Continue to monitor.  Had mild AKI which has resolved.   Schizophrenia.  Lives at a group home, question compliance with her home Zyprexa, home medication continued,  delusions slightly improved on 09/12/2022, by psych cleared on home dose Zyprexa to group home with outpatient psych follow-up.  Initially was IVC now rescinded.    Discharge diagnosis     Principal Problem:   Hypertensive crisis Active Problems:   Hypertrophic cardiomyopathy (HCC)   Obesity (BMI 30-39.9)   CKD stage 3a, GFR 45-59 ml/min (HCC)   Schizophrenia (HCC)   Aneurysm of descending thoracic aorta without rupture (HCC)   History of abdominal aortic aneurysm (AAA)   CAD (coronary artery disease)   Chronic systolic CHF (congestive heart failure) reduced EF 30-35 (HCC)   Sinus bradycardia    Discharge instructions    Discharge Instructions     Diet - low sodium heart healthy   Complete by: As directed    Discharge instructions   Complete by: As directed    Follow with Primary  MD Hoy Register, MD and your psychiatrist in 7 days   Get CBC, CMP, 2 view Chest X ray -  checked next visit with your primary MD    Activity: As tolerated with Full fall precautions use walker/cane & assistance as needed  Disposition Home    Diet: Heart Healthy    Special Instructions: If you have smoked or chewed Tobacco  in the last 2 yrs please stop smoking, stop any regular Alcohol  and or any Recreational drug use.  On your next visit with your primary care physician please Get Medicines reviewed and adjusted.  Please request your Prim.MD to go over all Hospital Tests and Procedure/Radiological results at the follow up, please get all Hospital records sent to your Prim MD by signing hospital release before you go home.  If you experience worsening of your admission symptoms, develop shortness of breath, life threatening emergency, suicidal or homicidal thoughts you must seek medical attention immediately by calling 911 or calling your MD immediately  if symptoms less severe.  You Must read complete instructions/literature along with all the possible adverse reactions/side effects for all the Medicines you take and that have been prescribed to you. Take any new Medicines after you have completely understood and accpet all the possible adverse reactions/side effects.   Do not drive when taking Pain medications.  Do not take more than prescribed Pain, Sleep and Anxiety Medications   Increase activity slowly   Complete by: As directed        Discharge Medications   Allergies as of 09/14/2022   No Known Allergies      Medication List     TAKE these medications    amLODipine 10 MG tablet Commonly known as: NORVASC Take 1 tablet (10 mg total) by mouth daily.   aspirin EC 81 MG tablet Take 1 tablet (81 mg total) by mouth daily. Swallow whole.   atorvastatin 40 MG tablet Commonly known as: LIPITOR Take 1 tablet (40 mg total) by mouth at bedtime. What changed: when to  take this   carvedilol 6.25 MG tablet Commonly known as: COREG Take 1 tablet (6.25 mg total) by mouth 2 (two) times daily with a meal. What changed:  medication strength how much to take   empagliflozin 10 MG Tabs tablet Commonly known as: JARDIANCE Take 1 tablet (10 mg total) by mouth daily.   ferrous sulfate 325 (65 FE) MG EC tablet Take 1 tablet (325 mg total) by mouth daily with breakfast.   furosemide 40 MG tablet Commonly known as: LASIX Take 1 tablet (40 mg total) by mouth daily.   hydrALAZINE 100 MG  tablet Commonly known as: APRESOLINE Take 1 tablet (100 mg total) by mouth every 8 (eight) hours. What changed:  medication strength how much to take when to take this   isosorbide mononitrate 60 MG 24 hr tablet Commonly known as: IMDUR Take 1 tablet (60 mg total) by mouth daily.   OLANZapine 10 MG tablet Commonly known as: ZyPREXA Take 1 tablet (10 mg total) by mouth at bedtime. What changed: medication strength   potassium chloride 10 MEQ tablet Commonly known as: KLOR-CON M Take 1 tablet (10 mEq total) by mouth daily.         Follow-up Information     Hoy Register, MD. Schedule an appointment as soon as possible for a visit in 1 week(s).   Specialty: Family Medicine Why: Your psychiatrist within a week of discharge Contact information: [address] [place] [address] [phone]         Jake Bathe, MD. Schedule an appointment as soon as possible for a visit in 1 week(s).   Specialty: Cardiology Contact information: [address] [place] [address] [phone]         Maeola Harman, MD. Schedule an appointment as soon as possible for a visit in 1 month(s).   Specialties: Vascular Surgery, Cardiology Contact information: [address] [place] [address] [phone]                 Major procedures and Radiology Reports - PLEASE review detailed and final reports  thoroughly  -     ECHOCARDIOGRAM COMPLETE  Result Date: 09/11/2022    ECHOCARDIOGRAM REPORT   Patient Name:   BRESHAE BELCHER Larouche Date of Exam: 09/11/2022 Medical Rec #:  [id number]   Height:       67.0 in Accession #:    [id number]  Weight:       180.0 lb Date of Birth:  Aug 21, 1950   BSA:          1.934 m Patient Age:    72 years    BP:           123/108 mmHg Patient Gender: F           HR:           75 bpm. Exam Location:  Inpatient Procedure: 2D Echo, Cardiac Doppler and Color Doppler Indications:    Congenital Heart Disease  History:        Patient has prior history of Echocardiogram examinations, most                 recent 03/26/2022. CHF, Aortic Valve Disease; Risk                 Factors:Hypertension and Dyslipidemia.  Sonographer:    Raeford Razor Referring Phys: [id number] SUBRINA SUNDIL IMPRESSIONS  1. Left ventricular ejection fraction, by estimation, is 40 to 45%. The left ventricle has mildly decreased function. The left ventricle demonstrates global hypokinesis. There is severe concentric left ventricular hypertrophy. Left ventricular diastolic  parameters are consistent with Grade I diastolic dysfunction (impaired relaxation). Would consider cardiac amyloidosis based on appearance of LV.  2. Right ventricular systolic function is normal. The right ventricular size is normal. Tricuspid regurgitation signal is inadequate for assessing PA pressure.  3. Left atrial size was mildly dilated.  4. The mitral valve is normal in structure. No evidence of mitral valve regurgitation. No evidence of mitral stenosis.  5. The aortic valve is tricuspid. There is severe  calcifcation of the aortic valve. Aortic valve regurgitation is mild. Severe aortic valve stenosis. Aortic valve mean gradient measures 37.0 mmHg with AVA 0.84 cm^2.  6. The inferior vena cava is normal in size with greater than 50% respiratory variability, suggesting right atrial pressure of 3 mmHg.  7. Aortic dilatation noted. There is mild dilatation of the  ascending aorta, measuring 39 mm. FINDINGS  Left Ventricle: Left ventricular ejection fraction, by estimation, is 40 to 45%. The left ventricle has mildly decreased function. The left ventricle demonstrates global hypokinesis. The left ventricular internal cavity size was normal in size. There is  severe concentric left ventricular hypertrophy. Left ventricular diastolic parameters are consistent with Grade I diastolic dysfunction (impaired relaxation). Right Ventricle: The right ventricular size is normal. No increase in right ventricular wall thickness. Right ventricular systolic function is normal. Tricuspid regurgitation signal is inadequate for assessing PA pressure. Left Atrium: Left atrial size was mildly dilated. Right Atrium: Right atrial size was normal in size. Pericardium: There is no evidence of pericardial effusion. Mitral Valve: The mitral valve is normal in structure. There is mild calcification of the mitral valve leaflet(s). No evidence of mitral valve regurgitation. No evidence of mitral valve stenosis. Tricuspid Valve: The tricuspid valve is normal in structure. Tricuspid valve regurgitation is mild. Aortic Valve: The aortic valve is tricuspid. There is severe calcifcation of the aortic valve. Aortic valve regurgitation is mild. Severe aortic stenosis is present. Aortic valve mean gradient measures 37.0 mmHg. Aortic valve peak gradient measures 133.2  mmHg. Aortic valve area, by VTI measures 0.61 cm. Pulmonic Valve: The pulmonic valve was normal in structure. Pulmonic valve regurgitation is not visualized. Aorta: Aortic dilatation noted. There is mild dilatation of the ascending aorta, measuring 39 mm. Venous: The inferior vena cava is normal in size with greater than 50% respiratory variability, suggesting right atrial pressure of 3 mmHg. IAS/Shunts: No atrial level shunt detected by color flow Doppler.  LEFT VENTRICLE PLAX 2D LVIDd:         5.60 cm      Diastology LVIDs:         4.80 cm       LV e' medial:    1.85 cm/s LV PW:         1.50 cm      LV E/e' medial:  25.2 LV IVS:        1.50 cm      LV e' lateral:   8.05 cm/s LVOT diam:     2.00 cm      LV E/e' lateral: 5.8 LV SV:         64 LV SV Index:   33 LVOT Area:     3.14 cm  LV Volumes (MOD) LV vol d, MOD A2C: 186.0 ml LV vol d, MOD A4C: 159.0 ml LV vol s, MOD A2C: 131.0 ml LV vol s, MOD A4C: 115.0 ml LV SV MOD A2C:     55.0 ml LV SV MOD A4C:     159.0 ml LV SV MOD BP:      48.1 ml RIGHT VENTRICLE             IVC RV Basal diam:  2.80 cm     IVC diam: 1.70 cm RV S prime:     14.90 cm/s TAPSE (M-mode): 2.8 cm LEFT ATRIUM              Index        RIGHT ATRIUM  Index LA diam:        4.00 cm  2.07 cm/m   RA Area:     10.30 cm LA Vol (A2C):   101.0 ml 52.23 ml/m  RA Volume:   18.50 ml  9.57 ml/m LA Vol (A4C):   47.1 ml  24.36 ml/m LA Biplane Vol: 68.6 ml  35.48 ml/m  AORTIC VALVE AV Area (Vmax):    0.57 cm AV Area (Vmean):   0.53 cm AV Area (VTI):     0.61 cm AV Vmax:           577.00 cm/s AV Vmean:          406.000 cm/s AV VTI:            1.050 m AV Peak Grad:      133.2 mmHg AV Mean Grad:      37.0 mmHg LVOT Vmax:         105.00 cm/s LVOT Vmean:        68.300 cm/s LVOT VTI:          0.204 m LVOT/AV VTI ratio: 0.19  AORTA Ao Root diam: 3.50 cm Ao Asc diam:  3.90 cm MITRAL VALVE MV Area (PHT): 3.53 cm     SHUNTS MV Decel Time: 215 msec     Systemic VTI:  0.20 m MV E velocity: 46.70 cm/s   Systemic Diam: 2.00 cm MV A velocity: 110.00 cm/s MV E/A ratio:  0.42 Dalton McleanMD Electronically signed by Wilfred Lacy Signature Date/Time: 09/11/2022/5:33:55 PM    Final    CT Angio Chest/Abd/Pel for Dissection W and/or Wo Contrast  Result Date: 09/09/2022 CLINICAL DATA:  Acute aortic syndrome suspected EXAM: CT ANGIOGRAPHY CHEST, ABDOMEN AND PELVIS TECHNIQUE: Non-contrast CT of the chest was initially obtained. Multidetector CT imaging through the chest, abdomen and pelvis was performed using the standard protocol during bolus  administration of intravenous contrast. Multiplanar reconstructed images and MIPs were obtained and reviewed to evaluate the vascular anatomy. RADIATION DOSE REDUCTION: This exam was performed according to the departmental dose-optimization program which includes automated exposure control, adjustment of the mA and/or kV according to patient size and/or use of iterative reconstruction technique. CONTRAST:  75mL OMNIPAQUE IOHEXOL 350 MG/ML SOLN COMPARISON:  07/19/2022, 01/17/2019 FINDINGS: CTA CHEST FINDINGS VASCULAR Aorta: Satisfactory opacification of the aorta. Unchanged contour and caliber of the thoracic aorta, tubular ascending thoracic aorta measuring up to 3.9 x 3.8 cm. Unchanged enlargement of the descending thoracic aorta, notable for a thrombosed focal aneurysm or nonacute penetrating at the left aspect of the mid descending vessel, maximum caliber of the vessel at this level 5.5 x 4.1 cm (series 7, image 76). 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 Severe mixed calcific atherosclerosis Cardiovascular: No evidence of pulmonary embolism on limited non-tailored examination. Cardiomegaly. No pericardial effusion. Review of the MIP images confirms the above findings. NON VASCULAR Mediastinum/Nodes: No enlarged mediastinal, hilar, or axillary lymph nodes. Thyroid gland, trachea, and esophagus demonstrate no significant findings. Lungs/Pleura: Lungs are clear. No pleural effusion or pneumothorax. Musculoskeletal: No chest wall abnormality. No acute osseous findings. Review of the MIP images confirms the above findings. CTA ABDOMEN AND PELVIS FINDINGS VASCULAR Unchanged contour and caliber of the abdominal aorta. 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 No evidence of  dissection or other acute aortic pathology. Standard branching pattern of the abdominal aorta with solitary bilateral renal arteries. Review of the MIP images confirms the above findings. NON-VASCULAR Hepatobiliary: No solid liver abnormality is seen. No gallstones, gallbladder wall thickening, or biliary dilatation. Pancreas: Unremarkable. No pancreatic ductal dilatation or surrounding inflammatory changes. Spleen: Normal in size without significant abnormality. Adrenals/Urinary Tract: Adrenal glands are unremarkable. Kidneys are normal, without renal calculi, solid lesion, or hydronephrosis. Bladder is unremarkable. Stomach/Bowel: Stomach is within normal limits. Appendix appears normal. No evidence of bowel wall thickening, distention, or inflammatory changes. Descending and sigmoid diverticulosis. Lymphatic: No enlarged abdominal or pelvic lymph nodes. Reproductive: No mass. Similar loculated fluid or cystic lesion of the right ovary or adnexa measuring 4.8 x 2.5 cm, stable over numerous prior examinations dating back to at least 2020 and presumed benign (series 7, image 263) Other: No abdominal wall hernia or abnormality. No ascites. Musculoskeletal: No acute osseous findings. IMPRESSION: 1. Unchanged enlargement of the descending thoracic aorta, notable for a thrombosed focal aneurysm or nonacute penetrating ulceration at the left aspect of the mid descending vessel, maximum caliber of the vessel at this level 5.5 x 4.1 cm. 2. 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 3. 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 4. No evidence of dissection or other acute aortic pathology. 5. Severe mixed atherosclerosis throughout. 6. No acute findings. Aortic Atherosclerosis  (ICD10-I70.0). Electronically Signed   By: Jearld Lesch M.D.   On: 09/09/2022 17:42    Micro Results    No results found for this or any previous visit (from the past 240 hour(s)).  Today   Subjective    Jonathan Kirkendoll today has no headache,no chest abdominal pain,no new weakness tingling or numbness, feels much better wants to go home today.    Objective   Blood pressure 136/79, pulse 61, temperature 98 F (36.7 C), temperature source Oral, resp. rate 18, height 5\' 7"  (1.702 m), weight 107.5 kg, last menstrual period 05/10/2013, SpO2 96%.   Intake/Output Summary (Last 24 hours) at 09/14/2022 0718 Last data filed at 09/13/2022 1830 Gross per 24 hour  Intake 1560 ml  Output 1 ml  Net 1559 ml    Exam  Awake Alert, No new F.N deficits,  calm  [place].AT,PERRAL Supple Neck,   Symmetrical Chest wall movement, Good air movement bilaterally, CTAB RRR,No Gallops,   +ve B.Sounds, Abd Soft, Non tender,  No Cyanosis, Clubbing or edema    Data Review   Recent Labs  Lab 09/09/22 1505 09/09/22 1518 09/10/22 0032 09/12/22 0303 09/13/22 0154  WBC 5.0  --  4.8 5.7 5.2  HGB 13.1 14.3 11.8* 12.2 12.0  HCT 41.3 42.0 37.1 38.7 37.7  PLT 265  --  241 237 223  MCV 94.9  --  94.6 95.6 95.2  MCH 30.1  --  30.1 30.1 30.3  MCHC 31.7  --  31.8 31.5 31.8  RDW 13.7  --  13.6 13.5 13.4  LYMPHSABS 1.8  --   --  2.2 1.9  MONOABS 0.4  --   --  0.5 0.5  EOSABS 0.2  --   --  0.2 0.2  BASOSABS 0.0  --   --  0.0 0.0    Recent Labs  Lab 09/09/22 1505 09/09/22 1518 09/10/22 0032 09/12/22 0303 09/13/22 0154  NA 138  139 136 136 137  K 4.6 4.7 4.3 3.9 3.5  CL 104 107 105 101 102  CO2 25  --  25 26 27   ANIONGAP 9  --  6 9 8   GLUCOSE 129* 129* 101* 104* 119*  BUN 22 26* 19 29* 31*  CREATININE 1.67* 1.60* 1.40* 1.50* 1.47*  AST  --   --  16  --   --   ALT  --   --  16  --   --   ALKPHOS  --   --  68  --   --   BILITOT  --   --  0.8  --   --   ALBUMIN  --   --  3.1*  --   --   INR  --   --    --  1.1  --   TSH 0.515  --   --   --   --   BNP  --   --   --  90.5 67.2  MG  --   --   --  2.3 2.4  CALCIUM 9.6  --  9.0 9.3 9.5    Total Time in preparing paper work, data evaluation and todays exam - 35 minutes  Signature  -    Susa Raring M.D on 09/14/2022 at 7:18 AM   -  To page go to [email or web address]

## 2022-09-14 NOTE — TOC Progression Note (Signed)
Transition of Care [facility]) - Progression Note    Patient Details  Name: Erin Riley MRN: [id number] Date of Birth: 1950-03-22  Transition of Care [facility]) CM/SW Contact  Mearl Latin, LCSW Phone Number: 09/14/2022, 9:35 AM  Clinical Narrative:    CSW rescinded IVC, document uploaded into [facility], Envelope [phone].        Expected Discharge Plan and Services         Expected Discharge Date: 09/14/22                                     Social Determinants of Health (SDOH) Interventions SDOH Screenings   Food Insecurity: No Food Insecurity (09/12/2022)  Housing: Low Risk  (09/12/2022)  Transportation Needs: No Transportation Needs (09/12/2022)  Utilities: Not At Risk (09/12/2022)  Alcohol Screen: Low Risk  (06/06/2022)  Depression (PHQ2-9): Low Risk  (01/26/2021)  Tobacco Use: High Risk (09/09/2022)    Readmission Risk Interventions     No data to display

## 2022-09-15 NOTE — Telephone Encounter (Signed)
Have received multiple staff messages from providers in office. Multiple attempts have been made to schedule patient for an appointment since May. Patient also has referral at risk of being closed for unsuccessful attempts at scheduling appointment. Have spoken to patients son, Patient son states he will call back at later time to schedule appointment.

## 2022-09-18 ENCOUNTER — Telehealth: Payer: Self-pay

## 2022-09-18 NOTE — Transitions of Care (Post Inpatient/ED Visit) (Unsigned)
09/18/2022  Name: Erin Riley MRN: [id number] DOB: Sep 05, 1950  Today's TOC FU Call Status: TOC FU Call Complete Date: 09/18/22  Transition Care Management Follow-up Telephone Call Date of Discharge: 09/14/22 Discharge Facility: [name]) Type of Discharge: Inpatient Admission Primary Inpatient Discharge Diagnosis:: Hypertensive crisis How have you been since you were released from the hospital?: Better (per patient son) Any questions or concerns?: Yes Patient Questions/Concerns:: Son inquiring about Internittent FMLA  for care for mother Patient Questions/Concerns Addressed: Provided Patient Educational Materials  Items Reviewed: Did you receive and understand the discharge instructions provided?: Yes Medications obtained,verified, and reconciled?: Yes (Medications Reviewed) (per son pharmacy help set -up system to ensure mother is recieving  medication as instructed.) Any new allergies since your discharge?: No Dietary orders reviewed?: Yes Type of Diet Ordered:: low sodium Heart healthy diet Do you have support at home?: Yes People in Home: child(ren), dependent Name of Support/Comfort Primary Source: Son( Hill,[name])  Medications Reviewed Today: Medications Reviewed Today     Reviewed by Vic Blackbird, RN (Registered Nurse) on 09/18/22 at 1041  Med List Status: <None>   Medication Order Taking? Sig Documenting Provider Last Dose Status Informant  amLODipine (NORVASC) 10 MG tablet [id number]  Take 1 tablet (10 mg total) by mouth daily. Leroy Sea, MD  Active   aspirin EC 81 MG tablet [id number]  Take 1 tablet (81 mg total) by mouth daily. Swallow whole. Leroy Sea, MD  Active   atorvastatin (LIPITOR) 40 MG tablet [id number] No Take 1 tablet (40 mg total) by mouth at bedtime.  Patient taking differently: Take 40 mg by mouth daily.   Sarina Ill, DO 09/09/2022 am Active Self  carvedilol (COREG) 6.25 MG tablet [id number]  Take 1 tablet (6.25 mg  total) by mouth 2 (two) times daily with a meal. Leroy Sea, MD  Active   empagliflozin (JARDIANCE) 10 MG TABS tablet [id number]  Take 1 tablet (10 mg total) by mouth daily. Leroy Sea, MD  Active   ferrous sulfate 325 (65 FE) MG tablet [id number]  Take 1 tablet (325 mg total) by mouth daily with breakfast. Leroy Sea, MD  Active   furosemide (LASIX) 40 MG tablet [id number] No Take 1 tablet (40 mg total) by mouth daily. Sarina Ill, DO 09/09/2022 Active Self  hydrALAZINE (APRESOLINE) 100 MG tablet [id number]  Take 1 tablet (100 mg total) by mouth every 8 (eight) hours. Leroy Sea, MD  Active   isosorbide mononitrate (IMDUR) 60 MG 24 hr tablet [id number] No Take 1 tablet (60 mg total) by mouth daily. Sarina Ill, DO 09/09/2022 am Active Self  OLANZapine (ZYPREXA) 10 MG tablet [id number]  Take 1 tablet (10 mg total) by mouth at bedtime. Leroy Sea, MD  Active   potassium chloride (KLOR-CON M) 10 MEQ tablet [id number] No Take 1 tablet (10 mEq total) by mouth daily. Sarina Ill, DO 09/09/2022 am Active Self            Home Care and Equipment/Supplies: Were Home Health Services Ordered?: Yes Name of Home Health Agency:: [facility] Has Agency set up a time to come to your home?: No (per son the called and left message and he is planning on returning call.) Any new equipment or medical supplies ordered?: No  Functional Questionnaire: Do you need assistance with bathing/showering or dressing?: No Do you need assistance with meal preparation?: No Do you need assistance with eating?: No Do you need  assistance with getting out of bed/getting out of a chair/moving?: No Do you have difficulty managing or taking your medications?: No  Follow up appointments reviewed: PCP Follow-up appointment confirmed?: Yes Date of PCP follow-up appointment?: 09/21/22 Follow-up Provider: Dr. Alvis Lemmings Specialist [facility] Follow-up appointment  confirmed?: No Reason Specialist Follow-Up Not Confirmed: Patient has Specialist Provider Number and will Call for Appointment (per son he will call the set-up appointment.) Do you need transportation to your follow-up appointment?: No Do you understand care options if your condition(s) worsen?: Yes-patient verbalized understanding (son voiced that he knows when to call for help from PCP or taken mother to ED for evaluation.)    SIGNATURE: Elsie Lincoln, RN

## 2022-09-21 ENCOUNTER — Ambulatory Visit: Payer: 59 | Attending: Family Medicine | Admitting: Family Medicine

## 2022-09-21 ENCOUNTER — Encounter: Payer: Self-pay | Admitting: Family Medicine

## 2022-09-21 VITALS — BP 112/74 | HR 76 | Temp 97.8°F | Ht 67.0 in | Wt 235.4 lb

## 2022-09-21 DIAGNOSIS — I119 Hypertensive heart disease without heart failure: Secondary | ICD-10-CM | POA: Diagnosis not present

## 2022-09-21 DIAGNOSIS — I7123 Aneurysm of the descending thoracic aorta, without rupture: Secondary | ICD-10-CM

## 2022-09-21 DIAGNOSIS — E785 Hyperlipidemia, unspecified: Secondary | ICD-10-CM | POA: Diagnosis not present

## 2022-09-21 DIAGNOSIS — I422 Other hypertrophic cardiomyopathy: Secondary | ICD-10-CM

## 2022-09-21 DIAGNOSIS — F209 Schizophrenia, unspecified: Secondary | ICD-10-CM | POA: Diagnosis not present

## 2022-09-21 MED ORDER — AMLODIPINE BESYLATE 10 MG PO TABS: 10.0000 mg | ORAL_TABLET | Freq: Every day | ORAL | 1 refills | Status: DC

## 2022-09-21 MED ORDER — CARVEDILOL 6.25 MG PO TABS: 6.2500 mg | ORAL_TABLET | Freq: Two times a day (BID) | ORAL | 1 refills | Status: DC

## 2022-09-21 MED ORDER — ISOSORBIDE MONONITRATE ER 60 MG PO TB24: 60.0000 mg | ORAL_TABLET | Freq: Every day | ORAL | 1 refills | Status: DC

## 2022-09-21 MED ORDER — HYDRALAZINE HCL 100 MG PO TABS: 100.0000 mg | ORAL_TABLET | Freq: Three times a day (TID) | ORAL | 1 refills | Status: DC

## 2022-09-21 MED ORDER — EMPAGLIFLOZIN 10 MG PO TABS: 10.0000 mg | ORAL_TABLET | Freq: Every day | ORAL | 1 refills | Status: DC

## 2022-09-21 MED ORDER — ATORVASTATIN CALCIUM 40 MG PO TABS: 40.0000 mg | ORAL_TABLET | Freq: Every day | ORAL | 1 refills | Status: DC

## 2022-09-21 NOTE — Patient Instructions (Signed)
Please call the vascular clinic to set up an appointment to follow-up on your aneurysm: Placed in [facility]   [address]  [place], [place] [address] Ph# [phone]

## 2022-09-21 NOTE — Progress Notes (Signed)
Subjective:  Patient ID: Erin Riley, female    DOB: 1950-03-27  Age: 72 y.o. MRN: [id number]  CC: Hospitalization Follow-up   HPI Erin Riley is a 72 y.o. 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 Accompanied by son to this transition of care visit. She was hospitalized 09/09/2022 through 09/14/2022 for hypertensive crisis.  Her antihypertensive regimen was adjusted during her hospitalization. Echo revealed EF of 40 to 45%, grade 1 DD, consideration for cardiac amyloidosis based on appearance of left ventricle, LV global hypokinesis, severe concentric LVH, dilated left atrium.  Interval History: Discussed the use of AI scribe software for clinical note transcription with the patient, who gave verbal consent to proceed.    She reports feeling fine and has no specific concerns. She was found to have an aneurysm on a CT abdomen for which I had referred her to cardiothoracic surgery in 07/2022 but she never made it there.  During her hospitalization she was evaluated by vascular surgery with recommendations to follow-up outpatient. CT angio chest/abdomen/pelvis: IMPRESSION: 1. Unchanged enlargement of the descending thoracic aorta, notable for a thrombosed focal aneurysm or nonacute penetrating ulceration at the left aspect of the mid descending vessel, maximum caliber of the vessel at this level 5.5 x 4.1 cm. 2. 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 3. 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 4. No evidence of dissection or other acute aortic pathology. 5. Severe mixed atherosclerosis throughout. 6. No acute findings.   Aortic Atherosclerosis  (ICD10-I70.0).   Denies presence of chest pain, dyspnea, pedal edema, palpitations or lightheadedness. She states that the back pain which she also presented to the ED with has since resolved. She is currently living in an assisted living facility and is reportedly adherent with her medications, which include amlodipine, carvedilol, and Zyprexa.        Past Medical History:  Diagnosis Date   Chronic back pain    Chronic kidney disease (CKD), stage II (mild)    Class I-II   Coronary artery disease 04/2009   50% stenosis in the perforator of LAD; catheterization was for an abnormal Myoview in January 2000 showing anterior and inferolateral ischemia.   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 > 3 subsequent Echos show improved/normal EF with moderate to severe concentric LVH and diastolic dysfunction with LVOT/intracavitary gradient --> 06/2016: Severe LVH.  Vigorous EF, 65-70%.?? Gr 1 DD. Mild AS.   Hyperlipidemia    Hypertension    Hypertensive hypertrophic cardiomyopathy: NYHA class II:  Echo: Severe concentric LVH with LV OT gradient; essentially preserved EF with diastolic dysfunction 02/15/2013   Echo 06/2016: Severe Concentric LVH. Vigorous EF 65-70%. ~ Gr I DD.    Mild aortic stenosis by prior echocardiography    Echo 06/2016: Mild AS (Mean Gradient 15 mmHg); has had prior Mod-Severe MR (not seen on current echo)   PAD (peripheral artery disease) (HCC) March 2013   Lower extremity Dopplers: R. SFA 50-60%, R. PTA proximally occluded with distal reconstitution;; L. common iliac ~50%, L. SFA 50-70% stenosis, L. PTA < 50%   Schizophrenia (HCC)     Past Surgical History:  Procedure Laterality Date   BUNIONECTOMY     carotid doppler  05/29/2011   left bulb/prox ICA moderate amtfibrous plaque with no evidence significant reduction.,right bulb /proximal ICA normal patency    lower extremity doppler  05/29/2011   right SFA 50% to 59% diameter reduction,right posterior tibal atreery occlusive disease,reconstituting distally, left common illiac<50%,left SFA 50 to70%,left post. tibial <50%   NM MYOCAR PERF WALL MOTION  03/2009   Persantine; EF 51%-both anterior and inferolateral ischemia   TRANSTHORACIC ECHOCARDIOGRAM  06/2016   Severe LVH.  Vigorous EF of 65-70%.  No RWMA. ~Only grade 1 diastolic dysfunction.  Mild aortic stenosis (mean gradient 15 mmHg)   TRANSTHORACIC ECHOCARDIOGRAM  07/2012   EF 50-55%; severe concentric LVH; only grade 1 diastolic dysfunction. Mild aortic sclerosis - with LVOT /intracavitary gradient of roughly 20 mmHg mean. Mild to moderately dilated LA;; previously reported MR not seen    Family History  Problem Relation Age of Onset   Hypertension Mother    Breast cancer Neg Hx     Social History   Socioeconomic History   Marital status: Single    Spouse name: Not on file   Number of children: 7   Years of education: Not on file   Highest education level: Not on file  Occupational History   Not on file  Tobacco Use   Smoking status: Some Days    Current packs/day: 0.00    Types: Cigarettes    Last attempt to quit: 05/14/2002    Years since quitting: 20.3   Smokeless tobacco: Never  Vaping Use   Vaping status: Never Used  Substance and Sexual Activity   Alcohol use: No   Drug use: No   Sexual activity: Yes    Birth control/protection: Post-menopausal, None  Other Topics Concern   Not on file  Social History Narrative   Now single mother of 2 with one grandchild. She quit smoking roughly 5 years ago and is not so since. She has also stopped drinking alcohol. She does try get routine exercise walking at least a mile 3-4 days a week.    She lives with her 57 year old mother. 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   Social Determinants of Health   Financial Resource Strain: Not on file  Food Insecurity: No Food Insecurity  (09/12/2022)   Hunger Vital Sign    Worried About Running Out of Food in the Last Year: Never true    Ran Out of Food in the Last Year: Never true  Transportation Needs: No Transportation Needs (09/12/2022)   PRAPARE - Administrator, Civil Service (Medical): No    Lack of Transportation (Non-Medical): No  Physical Activity: Not on file  Stress: Not on file  Social Connections: Not on file    No Known Allergies  Outpatient Medications Prior to Visit  Medication Sig Dispense Refill   aspirin EC 81 MG tablet Take 1 tablet (81 mg total) by mouth daily. Swallow whole. 120 tablet 0   ferrous sulfate 325 (65 FE) MG tablet Take 1 tablet (325 mg total) by mouth daily with breakfast. 100 tablet 0   furosemide (LASIX) 40 MG tablet Take 1 tablet (40 mg total) by mouth daily. 30 tablet 1   OLANZapine (ZYPREXA) 10 MG tablet Take 1 tablet (10 mg total) by mouth at bedtime. 30 tablet 0   potassium chloride (KLOR-CON M) 10 MEQ tablet Take 1 tablet (10 mEq total) by mouth daily. 30 tablet 3   amLODipine (NORVASC)  10 MG tablet Take 1 tablet (10 mg total) by mouth daily. 30 tablet 0   atorvastatin (LIPITOR) 40 MG tablet Take 1 tablet (40 mg total) by mouth at bedtime. (Patient taking differently: Take 40 mg by mouth daily.) 30 tablet 3   carvedilol (COREG) 6.25 MG tablet Take 1 tablet (6.25 mg total) by mouth 2 (two) times daily with a meal. 60 tablet 0   empagliflozin (JARDIANCE) 10 MG TABS tablet Take 1 tablet (10 mg total) by mouth daily. 30 tablet 0   hydrALAZINE (APRESOLINE) 100 MG tablet Take 1 tablet (100 mg total) by mouth every 8 (eight) hours. 90 tablet 0   isosorbide mononitrate (IMDUR) 60 MG 24 hr tablet Take 1 tablet (60 mg total) by mouth daily. 30 tablet 3   No facility-administered medications prior to visit.     ROS Review of Systems  Constitutional:  Negative for activity change and appetite change.  HENT:  Negative for sinus pressure and sore throat.   Respiratory:   Negative for chest tightness, shortness of breath and wheezing.   Cardiovascular:  Negative for chest pain and palpitations.  Gastrointestinal:  Negative for abdominal distention, abdominal pain and constipation.  Genitourinary: Negative.   Musculoskeletal: Negative.   Psychiatric/Behavioral:  Negative for behavioral problems and dysphoric mood.     Objective:  BP 112/74   Pulse 76   Temp 97.8 F (36.6 C) (Oral)   Ht 5\' 7"  (1.702 m)   Wt 235 lb 6.4 oz (106.8 kg)   LMP 05/10/2013   SpO2 97%   BMI 36.87 kg/m      09/21/2022   11:34 AM 09/21/2022   10:51 AM 09/14/2022    9:23 AM  BP/Weight  Systolic BP 112 162 156  Diastolic BP 74 105 109  Wt. (Lbs)  235.4   BMI  36.87 kg/m2       Physical Exam Constitutional:      Appearance: She is well-developed.  Cardiovascular:     Rate and Rhythm: Normal rate.     Heart sounds: Normal heart sounds. No murmur heard. Pulmonary:     Effort: Pulmonary effort is normal.     Breath sounds: Normal breath sounds. No wheezing or rales.  Chest:     Chest wall: No tenderness.  Abdominal:     General: Bowel sounds are normal. There is no distension.     Palpations: Abdomen is soft. There is no mass.     Tenderness: There is no abdominal tenderness.  Musculoskeletal:        General: Normal range of motion.     Right lower leg: No edema.     Left lower leg: No edema.  Neurological:     Mental Status: She is alert and oriented to person, place, and time.  Psychiatric:        Mood and Affect: Mood normal.        Latest Ref Rng & Units 09/13/2022    1:54 AM 09/12/2022    3:03 AM 09/10/2022   12:32 AM  CMP  Glucose 70 - 99 mg/dL 161  096  045   BUN 8 - 23 mg/dL 31  29  19    Creatinine 0.44 - 1.00 mg/dL 4.09  8.11  9.14   Sodium 135 - 145 mmol/L 137  136  136   Potassium 3.5 - 5.1 mmol/L 3.5  3.9  4.3   Chloride 98 - 111 mmol/L 102  101  105   CO2 22 - 32  mmol/L 27  26  25    Calcium 8.9 - 10.3 mg/dL 9.5  9.3  9.0   Total Protein 6.5  - 8.1 g/dL   6.7   Total Bilirubin 0.3 - 1.2 mg/dL   0.8   Alkaline Phos 38 - 126 U/L   68   AST 15 - 41 U/L   16   ALT 0 - 44 U/L   16     Lipid Panel     Component Value Date/Time   CHOL 147 03/27/2022 0442   CHOL 158 02/15/2021 1048   TRIG 89 03/27/2022 0442   HDL 37 (L) 03/27/2022 0442   HDL 38 (L) 02/15/2021 1048   CHOLHDL 4.0 03/27/2022 0442   VLDL 18 03/27/2022 0442   LDLCALC 92 03/27/2022 0442   LDLCALC 101 (H) 02/15/2021 1048    CBC    Component Value Date/Time   WBC 5.2 09/13/2022 0154   RBC 3.96 09/13/2022 0154   HGB 12.0 09/13/2022 0154   HGB 10.4 (L) 02/15/2021 1048   HCT 37.7 09/13/2022 0154   HCT 32.5 (L) 02/15/2021 1048   PLT 223 09/13/2022 0154   PLT 360 02/15/2021 1048   MCV 95.2 09/13/2022 0154   MCV 89 02/15/2021 1048   MCH 30.3 09/13/2022 0154   MCHC 31.8 09/13/2022 0154   RDW 13.4 09/13/2022 0154   RDW 12.7 02/15/2021 1048   LYMPHSABS 1.9 09/13/2022 0154   LYMPHSABS 1.8 02/15/2021 1048   MONOABS 0.5 09/13/2022 0154   EOSABS 0.2 09/13/2022 0154   EOSABS 0.3 02/15/2021 1048   BASOSABS 0.0 09/13/2022 0154   BASOSABS 0.0 02/15/2021 1048    Lab Results  Component Value Date   HGBA1C 5.8 (H) 06/10/2022    Assessment & Plan:      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 No current symptoms. Patient has been referred to a cardiothoracic surgeon but has not yet attended the appointment. -Provide patient with surgeon's contact information to schedule an appointment for evaluation and management of the aneurysm.  Hypertension: Elevated blood pressure at today's visit initially but repeat blood pressure returned normal. Patient is currently on Amlodipine, Carvedilol, and Hydralazine. -Recheck blood pressure today. -Counseled on blood pressure goal of less than 130/80, low-sodium, DASH diet, medication compliance, 150 minutes of moderate intensity exercise per week. Discussed medication compliance, adverse effects.  Chronic Kidney Disease  stage III AA: Slight kidney abnormality noted on recent blood tests, possibly related to hypertension. -Continue to monitor kidney function and control blood pressure to prevent further kidney damage.  Hypertrophic cardiomyopathy: EF of 40 to 45% -Echo concerning for possible amyloidosis -Cardiology referral -Continue SGLT2i  Schizophrenia: Patient is currently on Zyprexa. -Referred to psych -Continue current medication regimen.  Follow-up: Depending on blood pressure control, follow-up in 3 months (if blood pressure remains high) or in 6 months (if blood pressure is controlled).           Meds ordered this encounter  Medications   amLODipine (NORVASC) 10 MG tablet    Sig: Take 1 tablet (10 mg total) by mouth daily.    Dispense:  90 tablet    Refill:  1   atorvastatin (LIPITOR) 40 MG tablet    Sig: Take 1 tablet (40 mg total) by mouth at bedtime.    Dispense:  90 tablet    Refill:  1   carvedilol (COREG) 6.25 MG tablet    Sig: Take 1 tablet (6.25 mg total) by mouth 2 (two) times daily with a meal.  Dispense:  180 tablet    Refill:  1   empagliflozin (JARDIANCE) 10 MG TABS tablet    Sig: Take 1 tablet (10 mg total) by mouth daily.    Dispense:  90 tablet    Refill:  1   hydrALAZINE (APRESOLINE) 100 MG tablet    Sig: Take 1 tablet (100 mg total) by mouth every 8 (eight) hours.    Dispense:  270 tablet    Refill:  1   isosorbide mononitrate (IMDUR) 60 MG 24 hr tablet    Sig: Take 1 tablet (60 mg total) by mouth daily.    Dispense:  90 tablet    Refill:  1    Follow-up: Return in about 6 months (around 03/24/2023) for Chronic medical conditions.       Hoy Register, MD, FAAFP. [facility] [place], [place] [phone]   09/21/2022, 1:30 PM

## 2022-10-09 ENCOUNTER — Encounter: Payer: Self-pay | Admitting: Cardiology

## 2022-10-25 ENCOUNTER — Encounter: Payer: 59 | Admitting: Vascular Surgery

## 2022-12-25 ENCOUNTER — Telehealth: Payer: Self-pay | Admitting: *Deleted

## 2022-12-25 NOTE — Telephone Encounter (Signed)
Per chart review, pt lives in group home and phone number belongs to her son, [name], for which pt has signed DPR. Message left for pt's son that Dr. Alvis Lemmings had screening test request for pt, calling to discuss.

## 2022-12-28 NOTE — Telephone Encounter (Signed)
The option she has are colonoscopy and Cologuard.  Colonoscopy will require bowel prep from a few days prior to procedure and this will resulted in diarrhea about 24 hours prior to procedure.  If the son feels mother will not be able to comply with a bowel prep then a Cologuard test can be ordered for her.  Cologuard last for 3 years but before colonoscopy is normal it could last up to 10 years.

## 2022-12-28 NOTE — Telephone Encounter (Signed)
Pt is asking for advice on CRC

## 2022-12-29 NOTE — Telephone Encounter (Signed)
Call returned to patient's son, Erin Riley :[phone] , message left with call back requested. I want to share the information from Dr Alvis Lemmings about colon cancer screening options.

## 2023-01-03 ENCOUNTER — Emergency Department (HOSPITAL_COMMUNITY)
Admission: EM | Admit: 2023-01-03 | Discharge: 2023-01-05 | Disposition: A | Discharge status: Other Acute Inpatient Hospital | Payer: 59

## 2023-01-03 ENCOUNTER — Ambulatory Visit (INDEPENDENT_AMBULATORY_CARE_PROVIDER_SITE_OTHER)
Admission: EM | Admit: 2023-01-03 | Discharge: 2023-01-03 | Disposition: A | Discharge status: Other Acute Inpatient Hospital | Payer: 59 | Source: Home / Self Care

## 2023-01-03 ENCOUNTER — Other Ambulatory Visit: Payer: Self-pay

## 2023-01-03 DIAGNOSIS — R441 Visual hallucinations: Secondary | ICD-10-CM | POA: Insufficient documentation

## 2023-01-03 DIAGNOSIS — I714 Abdominal aortic aneurysm, without rupture, unspecified: Secondary | ICD-10-CM | POA: Insufficient documentation

## 2023-01-03 DIAGNOSIS — F201 Disorganized schizophrenia: Secondary | ICD-10-CM | POA: Diagnosis present

## 2023-01-03 DIAGNOSIS — I712 Thoracic aortic aneurysm, without rupture, unspecified: Secondary | ICD-10-CM | POA: Insufficient documentation

## 2023-01-03 DIAGNOSIS — Z8679 Personal history of other diseases of the circulatory system: Secondary | ICD-10-CM | POA: Insufficient documentation

## 2023-01-03 DIAGNOSIS — J441 Chronic obstructive pulmonary disease with (acute) exacerbation: Secondary | ICD-10-CM | POA: Diagnosis not present

## 2023-01-03 DIAGNOSIS — R45850 Homicidal ideations: Secondary | ICD-10-CM | POA: Insufficient documentation

## 2023-01-03 DIAGNOSIS — R451 Restlessness and agitation: Secondary | ICD-10-CM | POA: Insufficient documentation

## 2023-01-03 DIAGNOSIS — I2510 Atherosclerotic heart disease of native coronary artery without angina pectoris: Secondary | ICD-10-CM | POA: Diagnosis not present

## 2023-01-03 DIAGNOSIS — R440 Auditory hallucinations: Secondary | ICD-10-CM | POA: Insufficient documentation

## 2023-01-03 DIAGNOSIS — N189 Chronic kidney disease, unspecified: Secondary | ICD-10-CM | POA: Insufficient documentation

## 2023-01-03 DIAGNOSIS — Z79899 Other long term (current) drug therapy: Secondary | ICD-10-CM | POA: Insufficient documentation

## 2023-01-03 DIAGNOSIS — F22 Delusional disorders: Secondary | ICD-10-CM

## 2023-01-03 DIAGNOSIS — F17210 Nicotine dependence, cigarettes, uncomplicated: Secondary | ICD-10-CM | POA: Insufficient documentation

## 2023-01-03 DIAGNOSIS — Z7982 Long term (current) use of aspirin: Secondary | ICD-10-CM | POA: Diagnosis not present

## 2023-01-03 DIAGNOSIS — I509 Heart failure, unspecified: Secondary | ICD-10-CM | POA: Insufficient documentation

## 2023-01-03 DIAGNOSIS — N182 Chronic kidney disease, stage 2 (mild): Secondary | ICD-10-CM | POA: Diagnosis not present

## 2023-01-03 DIAGNOSIS — I130 Hypertensive heart and chronic kidney disease with heart failure and stage 1 through stage 4 chronic kidney disease, or unspecified chronic kidney disease: Secondary | ICD-10-CM | POA: Diagnosis not present

## 2023-01-03 LAB — RAPID URINE DRUG SCREEN, HOSP PERFORMED
Amphetamines: NOT DETECTED
Barbiturates: NOT DETECTED
Benzodiazepines: NOT DETECTED
Cocaine: NOT DETECTED
Opiates: NOT DETECTED
Tetrahydrocannabinol: NOT DETECTED

## 2023-01-03 LAB — CBC WITH DIFFERENTIAL/PLATELET
Abs Immature Granulocytes: 0.04 10*3/uL (ref 0.00–0.07)
Basophils Absolute: 0 10*3/uL (ref 0.0–0.1)
Basophils Relative: 0 %
Eosinophils Absolute: 0.1 10*3/uL (ref 0.0–0.5)
Eosinophils Relative: 2 %
HCT: 36.7 % (ref 36.0–46.0)
Hemoglobin: 11.7 g/dL — ABNORMAL LOW (ref 12.0–15.0)
Immature Granulocytes: 1 %
Lymphocytes Relative: 27 %
Lymphs Abs: 1.6 10*3/uL (ref 0.7–4.0)
MCH: 29.6 pg (ref 26.0–34.0)
MCHC: 31.9 g/dL (ref 30.0–36.0)
MCV: 92.9 fL (ref 80.0–100.0)
Monocytes Absolute: 0.4 10*3/uL (ref 0.1–1.0)
Monocytes Relative: 7 %
Neutro Abs: 3.7 10*3/uL (ref 1.7–7.7)
Neutrophils Relative %: 63 %
Platelets: 288 10*3/uL (ref 150–400)
RBC: 3.95 MIL/uL (ref 3.87–5.11)
RDW: 13.3 % (ref 11.5–15.5)
WBC: 5.9 10*3/uL (ref 4.0–10.5)
nRBC: 0 % (ref 0.0–0.2)

## 2023-01-03 LAB — COMPREHENSIVE METABOLIC PANEL
ALT: 12 U/L (ref 0–44)
AST: 15 U/L (ref 15–41)
Albumin: 3.2 g/dL — ABNORMAL LOW (ref 3.5–5.0)
Alkaline Phosphatase: 78 U/L (ref 38–126)
Anion gap: 9 (ref 5–15)
BUN: 24 mg/dL — ABNORMAL HIGH (ref 8–23)
CO2: 24 mmol/L (ref 22–32)
Calcium: 9.2 mg/dL (ref 8.9–10.3)
Chloride: 102 mmol/L (ref 98–111)
Creatinine, Ser: 1.43 mg/dL — ABNORMAL HIGH (ref 0.44–1.00)
GFR, Estimated: 39 mL/min — ABNORMAL LOW (ref >60)
Glucose, Bld: 93 mg/dL (ref 70–99)
Potassium: 3.9 mmol/L (ref 3.5–5.1)
Sodium: 135 mmol/L (ref 135–145)
Total Bilirubin: 0.3 mg/dL (ref 0.3–1.2)
Total Protein: 7.3 g/dL (ref 6.5–8.1)

## 2023-01-03 LAB — CBG MONITORING, ED: Glucose-Capillary: 98 mg/dL (ref 70–99)

## 2023-01-03 LAB — ACETAMINOPHEN LEVEL: Acetaminophen (Tylenol), Serum: <10 ug/mL — ABNORMAL LOW (ref 10–30)

## 2023-01-03 LAB — SALICYLATE LEVEL: Salicylate Lvl: <7 mg/dL — ABNORMAL LOW (ref 7.0–30.0)

## 2023-01-03 LAB — ETHANOL: Alcohol, Ethyl (B): <10 mg/dL (ref <10)

## 2023-01-03 MED ORDER — HYDRALAZINE HCL 25 MG PO TABS
100.0000 mg | ORAL_TABLET | Freq: Three times a day (TID) | ORAL | Status: DC
  Administered 2023-01-04 – 2023-01-05 (×4): 100 mg via ORAL
  Filled 2023-01-03 (×4): qty 4

## 2023-01-03 MED ORDER — FUROSEMIDE 20 MG PO TABS
40.0000 mg | ORAL_TABLET | Freq: Every day | ORAL | Status: DC
  Administered 2023-01-03 – 2023-01-05 (×3): 40 mg via ORAL
  Filled 2023-01-03 (×3): qty 2

## 2023-01-03 MED ORDER — CARVEDILOL 3.125 MG PO TABS
6.2500 mg | ORAL_TABLET | Freq: Two times a day (BID) | ORAL | Status: DC
  Administered 2023-01-04 – 2023-01-05 (×3): 6.25 mg via ORAL
  Filled 2023-01-03 (×3): qty 2

## 2023-01-03 MED ORDER — EMPAGLIFLOZIN 10 MG PO TABS
10.0000 mg | ORAL_TABLET | Freq: Every day | ORAL | Status: DC
  Administered 2023-01-03 – 2023-01-05 (×3): 10 mg via ORAL
  Filled 2023-01-03 (×3): qty 1

## 2023-01-03 MED ORDER — POTASSIUM CHLORIDE CRYS ER 10 MEQ PO TBCR
10.0000 meq | EXTENDED_RELEASE_TABLET | Freq: Every day | ORAL | Status: DC
  Administered 2023-01-03 – 2023-01-05 (×3): 10 meq via ORAL
  Filled 2023-01-03 (×3): qty 1

## 2023-01-03 MED ORDER — HYDRALAZINE HCL 25 MG PO TABS
100.0000 mg | ORAL_TABLET | Freq: Once | ORAL | Status: AC
  Administered 2023-01-03: 100 mg via ORAL
  Filled 2023-01-03: qty 4

## 2023-01-03 MED ORDER — ATORVASTATIN CALCIUM 40 MG PO TABS
40.0000 mg | ORAL_TABLET | Freq: Every day | ORAL | Status: DC
  Administered 2023-01-03 – 2023-01-04 (×2): 40 mg via ORAL
  Filled 2023-01-03 (×2): qty 1

## 2023-01-03 MED ORDER — OLANZAPINE 10 MG PO TABS
10.0000 mg | ORAL_TABLET | Freq: Every day | ORAL | Status: DC
  Administered 2023-01-03 – 2023-01-04 (×2): 10 mg via ORAL
  Filled 2023-01-03 (×2): qty 1

## 2023-01-03 MED ORDER — ASPIRIN 81 MG PO TBEC
81.0000 mg | DELAYED_RELEASE_TABLET | Freq: Every day | ORAL | Status: DC
  Administered 2023-01-03 – 2023-01-05 (×3): 81 mg via ORAL
  Filled 2023-01-03 (×3): qty 1

## 2023-01-03 MED ORDER — AMLODIPINE BESYLATE 5 MG PO TABS
10.0000 mg | ORAL_TABLET | Freq: Every day | ORAL | Status: DC
  Administered 2023-01-03 – 2023-01-05 (×3): 10 mg via ORAL
  Filled 2023-01-03 (×3): qty 2

## 2023-01-03 MED ORDER — ISOSORBIDE MONONITRATE ER 30 MG PO TB24
60.0000 mg | ORAL_TABLET | Freq: Every day | ORAL | Status: DC
  Administered 2023-01-03 – 2023-01-05 (×3): 60 mg via ORAL
  Filled 2023-01-03 (×3): qty 2
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
## 2023-01-03 NOTE — ED Provider Triage Note (Signed)
Emergency Medicine Provider Triage Evaluation Note  Erin Riley , a 72 y.o. female  was evaluated in triage.  Pt complains of hallucinating, medication noncompliance.  Review of Systems  Positive: Visual halluncinations with h/o schizophrenia Negative: Fever, chest pain, SOb  Physical Exam  BP (!) 152/113   Pulse 95   Temp 98.2 F (36.8 C) (Oral)   Resp 16   LMP 05/10/2013   SpO2 100%  Gen:   Awake, no distress  Cooperative Resp:  Normal effort  MSK:   Moves extremities without difficulty  Other:  Hallucinating  Medical Decision Making  Medically screening exam initiated at 3:16 PM.  Appropriate orders placed.  Erin Riley was informed that the remainder of the evaluation will be completed by another provider, this initial triage assessment does not replace that evaluation, and the importance of remaining in the ED until their evaluation is complete.  Patient w/history of schizophrenia, sent from [facility] after she became threatening with staff. Arrive with [facility]   Elpidio Anis, PA-C 01/03/23 1610

## 2023-01-03 NOTE — Telephone Encounter (Signed)
I tried to reach patient's son, Erin Riley :[phone]  again and had to leave av message with call back requested. I want to share the information from Dr Alvis Lemmings about colon cancer screening options.
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
## 2023-01-03 NOTE — Discharge Instructions (Signed)
[name]

## 2023-01-03 NOTE — ED Notes (Signed)
Harris made aware of vitals and elevated BP.Pt was asymptomatic.

## 2023-01-03 NOTE — ED Notes (Signed)
Patient observed laughing and talking to internal stimuli
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
## 2023-01-04 ENCOUNTER — Encounter (HOSPITAL_COMMUNITY): Payer: Self-pay | Admitting: Psychiatry

## 2023-01-04 DIAGNOSIS — F201 Disorganized schizophrenia: Secondary | ICD-10-CM | POA: Diagnosis not present

## 2023-01-04 NOTE — ED Notes (Signed)
Magistrate denied IVC

## 2023-01-04 NOTE — ED Notes (Signed)
Resubmitted IVC waiting
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
## 2023-01-04 NOTE — ED Notes (Signed)
 IRIS

## 2023-01-04 NOTE — ED Notes (Signed)
IVC 3 copies  on the clipboard  in blue, one copy in red folder and medical records

## 2023-01-04 NOTE — ED Notes (Signed)
Psychiatric doc is at bedside

## 2023-01-04 NOTE — ED Notes (Signed)
Psych notified of pt. Still needs TTS. Pt IVC'd by Dr.Belfi at this time as pt is wanting to leave. Cooperative to change into purple scrubs when security was notified. PT was wanded by security and cleared at this time. Safety precautions maintained.

## 2023-01-04 NOTE — Progress Notes (Signed)
LCSW Progress Note  [id number]   Erin Riley  01/04/2023  11:55 PM    Inpatient Behavioral Health Placement  Pt meets inpatient criteria per Arsenio Loader, NP. There are no available beds within [facility] per [facility] [employer]. Referral was sent to the following facilities;    Destination  Service Provider Address Phone [facility]  [address] [place], [place] [address] [phone]  [facility]  [address] [place] [address] [phone]  [facility] Patient Placement  [facility] [place] [phone]  [facility]  [address] [place] [address] [phone]  [facility] [address]  [address] [place] [address] [phone]  [facility]  [address] [place], [place] [address] [phone]  [facility]  [address], [place] [address] [phone]  [facility]  [address] [place], [place] [address] [phone]  [facility]  [address] [place] [address] [phone]  [facility]  [address] [place] [address] [phone]  [facility]  [address] [place] [address] [phone]  [facility]  [address], [place] [address] [phone]  [facility]  [address] [place] [address] [phone]  [facility]  [address], [place] [address] [phone]  [facility]  [place] [phone]  [facility]  [address] [place] [address] [phone]   [facility]  [address] [place], [place] [phone]  [facility]  [address] [place] [address] [phone]  [facility]  [address], [place] [address] [phone]  [facility]  [address], [place] [address] [phone]  [facility]  [address], [place] [address] [phone]  [facility]  [address] [place] [address] [phone]  [facility]  [place] [phone]  [facility]  [address], [place] [address] [phone]  [facility]  [address], [place] [address] [phone]  [facility]  [address], [place] [address] [phone]  [facility]  [address] [place] [address] [phone]   Situation ongoing,  CSW will follow up.    Maryjean Ka, MSW, LCSWA 01/04/2023 11:55 PM

## 2023-01-04 NOTE — ED Notes (Signed)
IVC New ENLOPE  NUMBER [id number]   and [id number]

## 2023-01-04 NOTE — ED Notes (Signed)
Pt ambulated safely to the bathroom. Pt is A&O x4 at this time. Pt back in her room and falling asleep on the stretcher.

## 2023-01-04 NOTE — ED Provider Notes (Signed)
Patient is trying to leave.  I reviewed the psychiatry notes from be held yesterday who recommended patient be treated inpatient.  IVC papers were initiated.  Patient is still awaiting TTS consult   Rolan Bucco, MD 01/04/23 1051

## 2023-01-05 DIAGNOSIS — F201 Disorganized schizophrenia: Secondary | ICD-10-CM | POA: Diagnosis not present

## 2023-01-05 NOTE — Progress Notes (Signed)
Pt was accepted to [facility] TODAY 01/05/2023; Bed Assignment [facility] Fax Number: [phone] (Adult)  Pt meets inpatient criteria per Shearon Stalls    Attending Physician will be Dr. Loni Beckwith   Report can be called to:[phone]-Pager number, please leave a returned phone number to receive a phone call back.   Pt can arrive after 8:00am   Care Team notified: Gaetano Net, Daryel Gerald, Gabriel Rainwater, Shearon Stalls, Bethany Hendra,LCSW, Tunisha [place],LCSWA, Dahlia Client [place], [place] [facility] Brook McNichol,RN  Maryjean Ka, MSW, [facility] 01/05/2023 12:13 AM

## 2023-01-05 NOTE — ED Notes (Signed)
Pt ambulatory to restroom with steady gait.

## 2023-01-05 NOTE — ED Notes (Signed)
Pt received for care at 1900.  At this time, pt quietly resting in bed; respirations even and unlabored with no distress noted.  Bed in lowest position, wheels locked.  IVC precautions in place.
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
## 2023-01-05 NOTE — ED Notes (Signed)
This RN spoke with [facility] to answer intake questions.  Per caller, will move forward with accepting patient.

## 2023-01-05 NOTE — ED Notes (Signed)
Message left for [facility] nurse line with updates and ETA on arrival.

## 2023-01-05 NOTE — ED Notes (Signed)
Per case management:  Pt was accepted to [facility] today 01/05/2023.   Bed Assignment: [facility]  Fax Number: [phone] (Adult)   Pt meets inpatient criteria per Arsenio Loader, NP. Attending Physician: Dr. Loni Beckwith   Report can be called to [phone] (Pager number).  Please leave a return phone number to receive a phone call back.  Pt can arrive after 0800.

## 2023-01-05 NOTE — ED Notes (Signed)
Crackers, peanut butter and ginger ale given to pt. Pt denies any other needs at this time.

## 2023-01-05 NOTE — ED Provider Notes (Signed)
Emergency Medicine Observation Re-evaluation Note  Erin Riley is a 72 y.o. female, seen on rounds today.  Pt initially presented to the ED for complaints of Agitation and Medical Clearance Currently, the patient is sleeping.  Physical Exam  BP (!) 127/100   Pulse 100   Temp 98.1 F (36.7 C) (Oral)   Resp 18   Ht 1.702 m (5\' 7" )   Wt 106.8 kg   LMP 05/10/2013   SpO2 97%   BMI 36.88 kg/m  Physical Exam General: NAD, resting   ED Course / MDM  EKG:EKG Interpretation Date/Time:  Wednesday January 03 2023 15:48:32 EDT Ventricular Rate:  84 PR Interval:  172 QRS Duration:  102 QT Interval:  394 QTC Calculation: 465 R Axis:   -5  Text Interpretation: Normal sinus rhythm Possible Left atrial enlargement Left ventricular hypertrophy with repolarization abnormality ( R in aVL , Cornell product , Romhilt-Estes ) Abnormal ECG When compared with ECG of 10-Sep-2022 06:23, HEART RATE has increased Confirmed by Dione Booze ([id number]) on 01/04/2023 4:18:31 AM  I have reviewed the labs performed to date as well as medications administered while in observation.  Recent changes in the last 24 hours include persistent paranoia.  Plan  Current plan is for inpt psych.    Linwood Dibbles, MD 01/05/23 [phone]

## 2023-01-05 NOTE — ED Notes (Signed)
IVC CURRENT PAPERWORK ATTACHED TO CLIPBOARD IN BLUE ZONE PATIENT IN YELLOW

## 2023-01-05 NOTE — ED Notes (Signed)
Call received from [facility] and report given to Cyndia Bent RN.  Per caller, pt will just need updated vitals in morning and for day shift staff to call [phone] with updated vitals and to let staff know pt is on way.  Per caller, the above number will likely go to voicemail.  Caller states staff can use Prompt Option #2 and just say the patient is on the way and provide the updated vitals.  Per prior note, pt can arrive after 0800.

## 2023-01-05 NOTE — ED Notes (Signed)
Pt woke up and went to the restroom, when she exited the restroom this sitter attempted to ask patient if she was doing alright and she waved her hand in my face and said "I don't want to talk". After that I attempted to ask if she would like me to turn her TV down to let her rest and she shut the door in my face. Less than 5 minutes later she came out and said "I need help with my TV it's broke and won't turn down". This Nurse Tech went into room and got the TV to a comfortable volume and then attempted to ask if there was anything else I could do for her at which time she quickly said "nope leave everything the way it is and leave, bye".

## 2023-01-05 NOTE — ED Notes (Signed)
Throughout night, while pt was awake, pt speaking to staff with angry voice, pt does not want anyone touching her, she will not be on the same side of the bed as the person in her room, she wants her snacks, pills, and drink placed on the table and then she will pick it up, she will not take anything from staffs hands.

## 2023-04-05 NOTE — Telephone Encounter (Signed)
error

## 2023-05-21 ENCOUNTER — Emergency Department (HOSPITAL_COMMUNITY)

## 2023-05-21 ENCOUNTER — Other Ambulatory Visit: Payer: Self-pay

## 2023-05-21 ENCOUNTER — Emergency Department (HOSPITAL_COMMUNITY)
Admission: EM | Admit: 2023-05-21 | Discharge: 2023-05-21 | Disposition: A | Discharge status: Home / Self Care | Attending: Student | Admitting: Student

## 2023-05-21 DIAGNOSIS — R0602 Shortness of breath: Secondary | ICD-10-CM | POA: Diagnosis not present

## 2023-05-21 DIAGNOSIS — F17210 Nicotine dependence, cigarettes, uncomplicated: Secondary | ICD-10-CM | POA: Insufficient documentation

## 2023-05-21 DIAGNOSIS — Z7982 Long term (current) use of aspirin: Secondary | ICD-10-CM | POA: Insufficient documentation

## 2023-05-21 DIAGNOSIS — I130 Hypertensive heart and chronic kidney disease with heart failure and stage 1 through stage 4 chronic kidney disease, or unspecified chronic kidney disease: Secondary | ICD-10-CM | POA: Insufficient documentation

## 2023-05-21 DIAGNOSIS — Z7984 Long term (current) use of oral hypoglycemic drugs: Secondary | ICD-10-CM | POA: Insufficient documentation

## 2023-05-21 DIAGNOSIS — Z79899 Other long term (current) drug therapy: Secondary | ICD-10-CM | POA: Diagnosis not present

## 2023-05-21 DIAGNOSIS — I5043 Acute on chronic combined systolic (congestive) and diastolic (congestive) heart failure: Secondary | ICD-10-CM | POA: Diagnosis not present

## 2023-05-21 DIAGNOSIS — R600 Localized edema: Secondary | ICD-10-CM | POA: Diagnosis not present

## 2023-05-21 DIAGNOSIS — I2510 Atherosclerotic heart disease of native coronary artery without angina pectoris: Secondary | ICD-10-CM | POA: Diagnosis not present

## 2023-05-21 DIAGNOSIS — M7989 Other specified soft tissue disorders: Secondary | ICD-10-CM | POA: Diagnosis not present

## 2023-05-21 DIAGNOSIS — R000 Tachycardia, unspecified: Secondary | ICD-10-CM | POA: Insufficient documentation

## 2023-05-21 DIAGNOSIS — N1831 Chronic kidney disease, stage 3a: Secondary | ICD-10-CM | POA: Insufficient documentation

## 2023-05-21 DIAGNOSIS — I5189 Other ill-defined heart diseases: Secondary | ICD-10-CM

## 2023-05-21 LAB — CBC
HCT: 30.2 % — ABNORMAL LOW (ref 36.0–46.0)
Hemoglobin: 9.2 g/dL — ABNORMAL LOW (ref 12.0–15.0)
MCH: 30.5 pg (ref 26.0–34.0)
MCHC: 30.5 g/dL (ref 30.0–36.0)
MCV: 100 fL (ref 80.0–100.0)
Platelets: 224 10*3/uL (ref 150–400)
RBC: 3.02 MIL/uL — ABNORMAL LOW (ref 3.87–5.11)
RDW: 17.8 % — ABNORMAL HIGH (ref 11.5–15.5)
WBC: 6 10*3/uL (ref 4.0–10.5)
nRBC: 0.5 % — ABNORMAL HIGH (ref 0.0–0.2)

## 2023-05-21 LAB — BASIC METABOLIC PANEL
Anion gap: 12 (ref 5–15)
BUN: 36 mg/dL — ABNORMAL HIGH (ref 8–23)
CO2: 24 mmol/L (ref 22–32)
Calcium: 8.7 mg/dL — ABNORMAL LOW (ref 8.9–10.3)
Chloride: 103 mmol/L (ref 98–111)
Creatinine, Ser: 1.54 mg/dL — ABNORMAL HIGH (ref 0.44–1.00)
GFR, Estimated: 36 mL/min — ABNORMAL LOW (ref >60)
Glucose, Bld: 111 mg/dL — ABNORMAL HIGH (ref 70–99)
Potassium: 3.9 mmol/L (ref 3.5–5.1)
Sodium: 139 mmol/L (ref 135–145)

## 2023-05-21 LAB — IRON AND TIBC
Iron: 46 ug/dL (ref 28–170)
Saturation Ratios: 16 % (ref 10.4–31.8)
TIBC: 284 ug/dL (ref 250–450)
UIBC: 238 ug/dL

## 2023-05-21 LAB — FERRITIN: Ferritin: 162 ng/mL (ref 11–307)

## 2023-05-21 LAB — BRAIN NATRIURETIC PEPTIDE: B Natriuretic Peptide: 1469.6 pg/mL — ABNORMAL HIGH (ref 0.0–100.0)

## 2023-05-21 MED ORDER — FUROSEMIDE 40 MG PO TABS: 40.0000 mg | ORAL_TABLET | Freq: Every day | ORAL | 1 refills | Status: DC

## 2023-05-21 MED ORDER — FUROSEMIDE 10 MG/ML IJ SOLN
40.0000 mg | Freq: Once | INTRAMUSCULAR | Status: AC
  Administered 2023-05-21: 40 mg via INTRAVENOUS
  Filled 2023-05-21: qty 4

## 2023-05-21 MED ORDER — ALBUMIN HUMAN 25 % IV SOLN
25.0000 g | Freq: Once | INTRAVENOUS | Status: AC
Start: 2023-05-21 — End: 2023-05-21
  Administered 2023-05-21: 12.5 g via INTRAVENOUS
  Filled 2023-05-21: qty 100
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
## 2023-05-21 NOTE — ED Notes (Signed)
 Name called by sort tech x 1 - no answer

## 2023-05-21 NOTE — ED Provider Triage Note (Signed)
 Emergency Medicine Provider Triage Evaluation Note  Erin Riley , a 73 y.o. female  was evaluated in triage.  Pt complains of leg swelling.  She had some additional complaints including being that she was pregnant in triage, history of schizophrenia.  She denies any chest pain, shortness of breath.  She only reports leg swelling.  Review of Systems  Positive: Htn, PAD, heart failure, schizophrenia Negative:   Physical Exam  BP (!) 99/59 (BP Location: Right Arm)   Pulse 60   Resp 20   Ht 5\' 8"  (1.727 m)   Wt 90.7 kg   LMP 05/10/2013   SpO2 99%   BMI 30.41 kg/m  Gen:   Awake, no distress   Resp:  Normal effort  MSK:   Moves extremities without difficulty  Other:  Around 1-2+ pitting edema bil lower extremity  Medical Decision Making  Medically screening exam initiated at 1:24 PM.  Appropriate orders placed.  Erin Riley was informed that the remainder of the evaluation will be completed by another provider, this initial triage assessment does not replace that evaluation, and the importance of remaining in the ED until their evaluation is complete.  Workup initiated in triage    Olene Floss, [place] 05/21/23 1328
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
## 2023-06-12 ENCOUNTER — Other Ambulatory Visit: Payer: Self-pay

## 2023-06-12 ENCOUNTER — Inpatient Hospital Stay (HOSPITAL_COMMUNITY)
Admission: EM | Admit: 2023-06-12 | Discharge: 2023-06-19 | DRG: 291 | Disposition: A | Discharge status: Home Health | Attending: Family Medicine | Admitting: Family Medicine

## 2023-06-12 ENCOUNTER — Emergency Department (HOSPITAL_COMMUNITY)

## 2023-06-12 ENCOUNTER — Encounter (HOSPITAL_COMMUNITY): Payer: Self-pay

## 2023-06-12 DIAGNOSIS — Z91148 Patient's other noncompliance with medication regimen for other reason: Secondary | ICD-10-CM | POA: Diagnosis not present

## 2023-06-12 DIAGNOSIS — E66812 Obesity, class 2: Secondary | ICD-10-CM | POA: Diagnosis present

## 2023-06-12 DIAGNOSIS — R0603 Acute respiratory distress: Secondary | ICD-10-CM

## 2023-06-12 DIAGNOSIS — Z23 Encounter for immunization: Secondary | ICD-10-CM

## 2023-06-12 DIAGNOSIS — Z7984 Long term (current) use of oral hypoglycemic drugs: Secondary | ICD-10-CM | POA: Diagnosis not present

## 2023-06-12 DIAGNOSIS — D631 Anemia in chronic kidney disease: Secondary | ICD-10-CM | POA: Diagnosis present

## 2023-06-12 DIAGNOSIS — M549 Dorsalgia, unspecified: Secondary | ICD-10-CM | POA: Diagnosis present

## 2023-06-12 DIAGNOSIS — E66811 Obesity, class 1: Secondary | ICD-10-CM | POA: Diagnosis not present

## 2023-06-12 DIAGNOSIS — I130 Hypertensive heart and chronic kidney disease with heart failure and stage 1 through stage 4 chronic kidney disease, or unspecified chronic kidney disease: Secondary | ICD-10-CM | POA: Diagnosis present

## 2023-06-12 DIAGNOSIS — I5189 Other ill-defined heart diseases: Secondary | ICD-10-CM

## 2023-06-12 DIAGNOSIS — N179 Acute kidney failure, unspecified: Secondary | ICD-10-CM | POA: Diagnosis present

## 2023-06-12 DIAGNOSIS — I509 Heart failure, unspecified: Secondary | ICD-10-CM | POA: Diagnosis not present

## 2023-06-12 DIAGNOSIS — E785 Hyperlipidemia, unspecified: Secondary | ICD-10-CM | POA: Diagnosis present

## 2023-06-12 DIAGNOSIS — I350 Nonrheumatic aortic (valve) stenosis: Secondary | ICD-10-CM | POA: Diagnosis not present

## 2023-06-12 DIAGNOSIS — Z8249 Family history of ischemic heart disease and other diseases of the circulatory system: Secondary | ICD-10-CM

## 2023-06-12 DIAGNOSIS — N183 Chronic kidney disease, stage 3 unspecified: Secondary | ICD-10-CM | POA: Diagnosis not present

## 2023-06-12 DIAGNOSIS — I080 Rheumatic disorders of both mitral and aortic valves: Secondary | ICD-10-CM | POA: Diagnosis present

## 2023-06-12 DIAGNOSIS — F209 Schizophrenia, unspecified: Secondary | ICD-10-CM | POA: Diagnosis present

## 2023-06-12 DIAGNOSIS — N1832 Chronic kidney disease, stage 3b: Secondary | ICD-10-CM | POA: Diagnosis present

## 2023-06-12 DIAGNOSIS — Z79899 Other long term (current) drug therapy: Secondary | ICD-10-CM

## 2023-06-12 DIAGNOSIS — Z556 Problems related to health literacy: Secondary | ICD-10-CM

## 2023-06-12 DIAGNOSIS — I5043 Acute on chronic combined systolic (congestive) and diastolic (congestive) heart failure: Secondary | ICD-10-CM | POA: Diagnosis present

## 2023-06-12 DIAGNOSIS — Z608 Other problems related to social environment: Secondary | ICD-10-CM | POA: Diagnosis present

## 2023-06-12 DIAGNOSIS — Z1152 Encounter for screening for COVID-19: Secondary | ICD-10-CM | POA: Diagnosis not present

## 2023-06-12 DIAGNOSIS — F17210 Nicotine dependence, cigarettes, uncomplicated: Secondary | ICD-10-CM | POA: Diagnosis present

## 2023-06-12 DIAGNOSIS — F39 Unspecified mood [affective] disorder: Secondary | ICD-10-CM | POA: Diagnosis present

## 2023-06-12 DIAGNOSIS — G8929 Other chronic pain: Secondary | ICD-10-CM | POA: Diagnosis present

## 2023-06-12 DIAGNOSIS — I420 Dilated cardiomyopathy: Secondary | ICD-10-CM | POA: Diagnosis present

## 2023-06-12 DIAGNOSIS — Z7982 Long term (current) use of aspirin: Secondary | ICD-10-CM

## 2023-06-12 DIAGNOSIS — I5023 Acute on chronic systolic (congestive) heart failure: Secondary | ICD-10-CM | POA: Diagnosis not present

## 2023-06-12 DIAGNOSIS — Z6836 Body mass index (BMI) 36.0-36.9, adult: Secondary | ICD-10-CM

## 2023-06-12 DIAGNOSIS — I2510 Atherosclerotic heart disease of native coronary artery without angina pectoris: Secondary | ICD-10-CM | POA: Diagnosis present

## 2023-06-12 DIAGNOSIS — I422 Other hypertrophic cardiomyopathy: Secondary | ICD-10-CM | POA: Diagnosis present

## 2023-06-12 DIAGNOSIS — E1151 Type 2 diabetes mellitus with diabetic peripheral angiopathy without gangrene: Secondary | ICD-10-CM | POA: Diagnosis present

## 2023-06-12 DIAGNOSIS — I2489 Other forms of acute ischemic heart disease: Secondary | ICD-10-CM | POA: Diagnosis present

## 2023-06-12 DIAGNOSIS — I071 Rheumatic tricuspid insufficiency: Secondary | ICD-10-CM | POA: Diagnosis not present

## 2023-06-12 DIAGNOSIS — I340 Nonrheumatic mitral (valve) insufficiency: Secondary | ICD-10-CM | POA: Diagnosis not present

## 2023-06-12 DIAGNOSIS — I10 Essential (primary) hypertension: Secondary | ICD-10-CM | POA: Diagnosis not present

## 2023-06-12 LAB — CBG MONITORING, ED: Glucose-Capillary: 90 mg/dL (ref 70–99)

## 2023-06-12 LAB — COMPREHENSIVE METABOLIC PANEL WITH GFR
ALT: 13 U/L (ref 0–44)
AST: 16 U/L (ref 15–41)
Albumin: 3.2 g/dL — ABNORMAL LOW (ref 3.5–5.0)
Alkaline Phosphatase: 72 U/L (ref 38–126)
Anion gap: 11 (ref 5–15)
BUN: 50 mg/dL — ABNORMAL HIGH (ref 8–23)
CO2: 25 mmol/L (ref 22–32)
Calcium: 9 mg/dL (ref 8.9–10.3)
Chloride: 105 mmol/L (ref 98–111)
Creatinine, Ser: 1.81 mg/dL — ABNORMAL HIGH (ref 0.44–1.00)
GFR, Estimated: 29 mL/min — ABNORMAL LOW (ref >60)
Glucose, Bld: 94 mg/dL (ref 70–99)
Potassium: 4.4 mmol/L (ref 3.5–5.1)
Sodium: 141 mmol/L (ref 135–145)
Total Bilirubin: 1.3 mg/dL — ABNORMAL HIGH (ref 0.0–1.2)
Total Protein: 6.9 g/dL (ref 6.5–8.1)

## 2023-06-12 LAB — I-STAT CHEM 8, ED
BUN: 45 mg/dL — ABNORMAL HIGH (ref 8–23)
Calcium, Ion: 1.16 mmol/L (ref 1.15–1.40)
Chloride: 104 mmol/L (ref 98–111)
Creatinine, Ser: 2 mg/dL — ABNORMAL HIGH (ref 0.44–1.00)
Glucose, Bld: 95 mg/dL (ref 70–99)
HCT: 32 % — ABNORMAL LOW (ref 36.0–46.0)
Hemoglobin: 10.9 g/dL — ABNORMAL LOW (ref 12.0–15.0)
Potassium: 4.1 mmol/L (ref 3.5–5.1)
Sodium: 142 mmol/L (ref 135–145)
TCO2: 25 mmol/L (ref 22–32)

## 2023-06-12 LAB — I-STAT VENOUS BLOOD GAS, ED
Acid-Base Excess: 2 mmol/L (ref 0.0–2.0)
Bicarbonate: 27.7 mmol/L (ref 20.0–28.0)
Calcium, Ion: 1.15 mmol/L (ref 1.15–1.40)
HCT: 31 % — ABNORMAL LOW (ref 36.0–46.0)
Hemoglobin: 10.5 g/dL — ABNORMAL LOW (ref 12.0–15.0)
O2 Saturation: 58 %
Potassium: 4.1 mmol/L (ref 3.5–5.1)
Sodium: 142 mmol/L (ref 135–145)
TCO2: 29 mmol/L (ref 22–32)
pCO2, Ven: 46.7 mmHg (ref 44–60)
pH, Ven: 7.381 (ref 7.25–7.43)
pO2, Ven: 31 mmHg — CL (ref 32–45)

## 2023-06-12 LAB — RESP PANEL BY RT-PCR (RSV, FLU A&B, COVID)  RVPGX2
Influenza A by PCR: NEGATIVE
Influenza B by PCR: NEGATIVE
Resp Syncytial Virus by PCR: NEGATIVE
SARS Coronavirus 2 by RT PCR: NEGATIVE

## 2023-06-12 LAB — CBC WITH DIFFERENTIAL/PLATELET
Abs Immature Granulocytes: 0.03 10*3/uL (ref 0.00–0.07)
Basophils Absolute: 0 10*3/uL (ref 0.0–0.1)
Basophils Relative: 0 %
Eosinophils Absolute: 0.1 10*3/uL (ref 0.0–0.5)
Eosinophils Relative: 1 %
HCT: 31.5 % — ABNORMAL LOW (ref 36.0–46.0)
Hemoglobin: 9.3 g/dL — ABNORMAL LOW (ref 12.0–15.0)
Immature Granulocytes: 0 %
Lymphocytes Relative: 21 %
Lymphs Abs: 1.5 10*3/uL (ref 0.7–4.0)
MCH: 29.2 pg (ref 26.0–34.0)
MCHC: 29.5 g/dL — ABNORMAL LOW (ref 30.0–36.0)
MCV: 98.7 fL (ref 80.0–100.0)
Monocytes Absolute: 0.7 10*3/uL (ref 0.1–1.0)
Monocytes Relative: 10 %
Neutro Abs: 4.9 10*3/uL (ref 1.7–7.7)
Neutrophils Relative %: 68 %
Platelets: 313 10*3/uL (ref 150–400)
RBC: 3.19 MIL/uL — ABNORMAL LOW (ref 3.87–5.11)
RDW: 16.6 % — ABNORMAL HIGH (ref 11.5–15.5)
WBC: 7.2 10*3/uL (ref 4.0–10.5)
nRBC: 0.4 % — ABNORMAL HIGH (ref 0.0–0.2)

## 2023-06-12 LAB — TROPONIN I (HIGH SENSITIVITY)
Troponin I (High Sensitivity): 57 ng/L — ABNORMAL HIGH (ref <18)
Troponin I (High Sensitivity): 61 ng/L — ABNORMAL HIGH (ref <18)

## 2023-06-12 LAB — BRAIN NATRIURETIC PEPTIDE: B Natriuretic Peptide: 3462.9 pg/mL — ABNORMAL HIGH (ref 0.0–100.0)

## 2023-06-12 LAB — MAGNESIUM: Magnesium: 2.1 mg/dL (ref 1.7–2.4)

## 2023-06-12 MED ORDER — MELATONIN 5 MG PO TABS
5.0000 mg | ORAL_TABLET | Freq: Every evening | ORAL | Status: DC | PRN
  Administered 2023-06-13 – 2023-06-14 (×2): 5 mg via ORAL
  Filled 2023-06-12 (×2): qty 1

## 2023-06-12 MED ORDER — HYDRALAZINE HCL 20 MG/ML IJ SOLN
5.0000 mg | Freq: Four times a day (QID) | INTRAMUSCULAR | Status: DC | PRN
  Administered 2023-06-14: 5 mg via INTRAVENOUS
  Filled 2023-06-12: qty 1

## 2023-06-12 MED ORDER — NITROGLYCERIN 2 % TD OINT
1.0000 [in_us] | TOPICAL_OINTMENT | Freq: Once | TRANSDERMAL | Status: AC
  Administered 2023-06-12: 1 [in_us] via TOPICAL
  Filled 2023-06-12: qty 1

## 2023-06-12 MED ORDER — ACETAMINOPHEN 325 MG PO TABS
650.0000 mg | ORAL_TABLET | Freq: Four times a day (QID) | ORAL | Status: DC | PRN
  Administered 2023-06-13 – 2023-06-14 (×2): 650 mg via ORAL
  Filled 2023-06-12 (×3): qty 2

## 2023-06-12 MED ORDER — ENOXAPARIN SODIUM 40 MG/0.4ML IJ SOSY: 40.0000 mg | PREFILLED_SYRINGE | INTRAMUSCULAR | Status: DC

## 2023-06-12 MED ORDER — PROCHLORPERAZINE EDISYLATE 10 MG/2ML IJ SOLN
5.0000 mg | Freq: Four times a day (QID) | INTRAMUSCULAR | Status: DC | PRN
Start: 2023-06-12 — End: 2023-06-14

## 2023-06-12 MED ORDER — FUROSEMIDE 10 MG/ML IJ SOLN
40.0000 mg | Freq: Once | INTRAMUSCULAR | Status: AC
  Administered 2023-06-12: 40 mg via INTRAVENOUS
  Filled 2023-06-12: qty 4

## 2023-06-12 MED ORDER — ENOXAPARIN SODIUM 30 MG/0.3ML IJ SOSY
30.0000 mg | PREFILLED_SYRINGE | Freq: Every day | INTRAMUSCULAR | Status: DC
  Administered 2023-06-13: 30 mg via SUBCUTANEOUS
  Filled 2023-06-12: qty 0.3

## 2023-06-12 MED ORDER — POLYETHYLENE GLYCOL 3350 17 G PO PACK
17.0000 g | PACK | Freq: Every day | ORAL | Status: DC | PRN
Start: 2023-06-12 — End: 2023-06-19

## 2023-06-12 NOTE — ED Provider Notes (Signed)
 [place] EMERGENCY DEPARTMENT AT [facility] Provider Note   CSN: [id number] Arrival date & time: 06/12/23  1804     History  No chief complaint on file.   Erin Riley is a 73 y.o. female.  72 year old female with past medical history significant for hypertension presents today for concern of peripheral edema, orthopnea, conversational dyspnea.  She denies any previous history of CHF, renal issues.  She denies prior history of CAD or cardiac disease outside of hypertension.  Does have a PCP that she follows with.  States her symptoms have been worsening for the past 2 days.  The history is provided by the patient. No language interpreter was used.       Home Medications Prior to Admission medications   Medication Sig Start Date End Date Taking? Authorizing Provider  amLODipine (NORVASC) 10 MG tablet Take 1 tablet (10 mg total) by mouth daily. Patient not taking: Reported on 01/03/2023 09/21/22   Hoy Register, MD  aspirin EC 81 MG tablet Take 1 tablet (81 mg total) by mouth daily. Swallow whole. Patient not taking: Reported on 01/03/2023 09/14/22   Leroy Sea, MD  atorvastatin (LIPITOR) 40 MG tablet Take 1 tablet (40 mg total) by mouth at bedtime. Patient not taking: Reported on 01/03/2023 09/21/22   Hoy Register, MD  carvedilol (COREG) 6.25 MG tablet Take 1 tablet (6.25 mg total) by mouth 2 (two) times daily with a meal. Patient not taking: Reported on 01/03/2023 09/21/22   Hoy Register, MD  empagliflozin (JARDIANCE) 10 MG TABS tablet Take 1 tablet (10 mg total) by mouth daily. Patient not taking: Reported on 01/03/2023 09/21/22   Hoy Register, MD  ferrous sulfate 325 (65 FE) MG tablet Take 1 tablet (325 mg total) by mouth daily with breakfast. Patient not taking: Reported on 01/03/2023 09/14/22   Leroy Sea, MD  furosemide (LASIX) 40 MG tablet Take 1 tablet (40 mg total) by mouth daily. 05/21/23   Kommor, [place], MD  hydrALAZINE (APRESOLINE) 100  MG tablet Take 1 tablet (100 mg total) by mouth every 8 (eight) hours. Patient not taking: Reported on 01/03/2023 09/21/22   Hoy Register, MD  isosorbide mononitrate (IMDUR) 60 MG 24 hr tablet Take 1 tablet (60 mg total) by mouth daily. Patient not taking: Reported on 01/03/2023 09/21/22   Hoy Register, MD  OLANZapine (ZYPREXA) 10 MG tablet Take 1 tablet (10 mg total) by mouth at bedtime. Patient not taking: Reported on 01/03/2023 09/14/22 09/14/23  Leroy Sea, MD  potassium chloride (KLOR-CON M) 10 MEQ tablet Take 1 tablet (10 mEq total) by mouth daily. Patient not taking: Reported on 01/03/2023 06/15/22   Sarina Ill, DO      Allergies    Patient has no known allergies.    Review of Systems   Review of Systems  Constitutional:  Negative for chills and fever.  Respiratory:  Positive for shortness of breath.   Cardiovascular:  Positive for leg swelling. Negative for chest pain.  Gastrointestinal:  Positive for abdominal distention. Negative for abdominal pain, nausea and vomiting.  Neurological:  Negative for light-headedness.  All other systems reviewed and are negative.   Physical Exam Updated Vital Signs BP (!) 174/126 (BP Location: Right Arm)   Pulse 70   Temp 97.6 F (36.4 C)   Resp (!) 22   Ht 5\' 8"  (1.727 m)   Wt 90.7 kg   LMP 05/10/2013   SpO2 93%   BMI 30.40 kg/m  Physical Exam  Vitals and nursing note reviewed.  Constitutional:      General: She is in acute distress (moderate resp distress. appears uncomfortable.).     Appearance: Normal appearance. She is not ill-appearing.  HENT:     Head: Normocephalic and atraumatic.     Nose: Nose normal.  Eyes:     General: No scleral icterus.    Extraocular Movements: Extraocular movements intact.     Conjunctiva/sclera: Conjunctivae normal.  Cardiovascular:     Rate and Rhythm: Normal rate and regular rhythm.  Pulmonary:     Effort: Respiratory distress present.     Breath sounds: Rales present.   Abdominal:     General: There is no distension.     Tenderness: There is no abdominal tenderness.  Musculoskeletal:        General: Normal range of motion.     Cervical back: Normal range of motion.  Skin:    General: Skin is warm and dry.  Neurological:     General: No focal deficit present.     Mental Status: She is alert. Mental status is at baseline.     ED Results / Procedures / Treatments   Labs (all labs ordered are listed, but only abnormal results are displayed) Labs Reviewed  RESP PANEL BY RT-PCR (RSV, FLU A&B, COVID)  RVPGX2  BRAIN NATRIURETIC PEPTIDE  CBC WITH DIFFERENTIAL/PLATELET  COMPREHENSIVE METABOLIC PANEL WITH GFR  MAGNESIUM  CBG MONITORING, ED  I-STAT VENOUS BLOOD GAS, ED  I-STAT CHEM 8, ED  TROPONIN I (HIGH SENSITIVITY)    EKG None  Radiology No results found.  Procedures .Critical Care  Performed by: Marita Kansas, PA-C Authorized by: Marita Kansas, PA-C   Critical care provider statement:    Critical care time (minutes):  30   Critical care was necessary to treat or prevent imminent or life-threatening deterioration of the following conditions:  Respiratory failure (CHF requiring admission.  Requiring supplemental O2.)   Critical care was time spent personally by me on the following activities:  Development of treatment plan with patient or surrogate, discussions with consultants, evaluation of patient's response to treatment, examination of patient, ordering and review of laboratory studies, ordering and review of radiographic studies, ordering and performing treatments and interventions, pulse oximetry, re-evaluation of patient's condition and review of old charts   Care discussed with: admitting provider       Medications Ordered in ED Medications - No data to display  ED Course/ Medical Decision Making/ A&P                                 Medical Decision Making Amount and/or Complexity of Data Reviewed Labs: ordered. Radiology:  ordered.  Risk Prescription drug management.   Medical Decision Making / ED Course   This patient presents to the ED for concern of shortness of breath, lower extremities with, this involves an extensive number of treatment options, and is a complaint that carries with it a high risk of complications and morbidity.  The differential diagnosis includes CHF exacerbation, kidney failure  MDM: 73 year old female presents today for concern of difficulty breathing.  Endorses exertional dyspnea, peripheral edema, orthopnea.  On exam she has conversational dyspnea.  No prior history of CHF or kidney disease.  Will obtain labs.  Does appear consistent with CHF but will wait for labs prior to given her Lasix.  CBC without leukocytosis.  Hemoglobin of 9.3 which is around her baseline.  CMP shows creatinine of 1.81 otherwise without acute concern.  Initial troponin is 61 likely demand.  BNP of 3400.  Respiratory panel negative.  Lasix 40 mg IVP ordered.  Appears to be new onset CHF.  Discussed with hospitalist will evaluate patient for admission.  Patient agreeable with plan. Patient require nasal cannula at 2 L.  No documented desaturation.  Appears it is for comfort.  Lab Tests: -I ordered, reviewed, and interpreted labs.   The pertinent results include:   Labs Reviewed  RESP PANEL BY RT-PCR (RSV, FLU A&B, COVID)  RVPGX2  BRAIN NATRIURETIC PEPTIDE  CBC WITH DIFFERENTIAL/PLATELET  COMPREHENSIVE METABOLIC PANEL WITH GFR  MAGNESIUM  CBG MONITORING, ED  I-STAT VENOUS BLOOD GAS, ED  I-STAT CHEM 8, ED  TROPONIN I (HIGH SENSITIVITY)      EKG  EKG Interpretation Date/Time:    Ventricular Rate:    PR Interval:    QRS Duration:    QT Interval:    QTC Calculation:   R Axis:      Text Interpretation:           Imaging Studies ordered: I ordered imaging studies including chest x-ray I independently visualized and interpreted imaging. I agree with the radiologist  interpretation   Medicines ordered and prescription drug management: Meds ordered this encounter  Medications   nitroGLYCERIN (NITROGLYN) 2 % ointment 1 inch    -I have reviewed the patients home medicines and have made adjustments as needed  Critical interventions Supplemental O2, IVP Lasix   Reevaluation: After the interventions noted above, I reevaluated the patient and found that they have :stayed the same  Co morbidities that complicate the patient evaluation  Past Medical History:  Diagnosis Date   Chronic back pain    Chronic kidney disease (CKD), stage II (mild)    Class I-II   Coronary artery disease 04/2009   50% stenosis in the perforator of LAD; catheterization was for an abnormal Myoview in January 2000 showing anterior and inferolateral ischemia.   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 > 3 subsequent Echos show improved/normal EF with moderate to severe concentric LVH and diastolic dysfunction with LVOT/intracavitary gradient --> 06/2016: Severe LVH.  Vigorous EF, 65-70%.?? Gr 1 DD. Mild AS.   Hyperlipidemia    Hypertension    Hypertensive hypertrophic cardiomyopathy: NYHA class II:  Echo: Severe concentric LVH with LV OT gradient; essentially preserved EF with diastolic dysfunction 02/15/2013   Echo 06/2016: Severe Concentric LVH. Vigorous EF 65-70%. ~ Gr I DD.    Mild aortic stenosis by prior echocardiography    Echo 06/2016: Mild AS (Mean Gradient 15 mmHg); has had prior Mod-Severe MR (not seen on current echo)   PAD (peripheral artery disease) (HCC) March 2013   Lower extremity Dopplers: R. SFA 50-60%, R. PTA proximally occluded with distal reconstitution;; L. common iliac ~50%, L. SFA 50-70% stenosis, L. PTA < 50%   Schizophrenia (HCC)       Dispostion: Discussed with hospitalist.  They will evaluate patient for admission.   Final Clinical  Impression(s) / ED Diagnoses Final diagnoses:  Congestive heart failure, unspecified HF chronicity, unspecified heart failure type [facility])  Respiratory distress    Rx / DC Orders ED Discharge Orders     None         Marita Kansas, PA-C 06/12/23 2254    Charlynne Pander, MD 06/13/23 [phone]

## 2023-06-12 NOTE — ED Triage Notes (Signed)
 Pt states her feet are swollen and hurting and she can't walk on themx2d. Pt has 2+ edema of ankles bilat. Pt is unable speak in complete sentences.

## 2023-06-13 ENCOUNTER — Inpatient Hospital Stay (HOSPITAL_COMMUNITY)

## 2023-06-13 DIAGNOSIS — I509 Heart failure, unspecified: Secondary | ICD-10-CM | POA: Diagnosis not present

## 2023-06-13 DIAGNOSIS — I5023 Acute on chronic systolic (congestive) heart failure: Secondary | ICD-10-CM | POA: Diagnosis not present

## 2023-06-13 DIAGNOSIS — I5043 Acute on chronic combined systolic (congestive) and diastolic (congestive) heart failure: Secondary | ICD-10-CM

## 2023-06-13 LAB — ECHOCARDIOGRAM COMPLETE
AR max vel: 0.96 cm2
AV Area VTI: 0.95 cm2
AV Area mean vel: 0.88 cm2
AV Mean grad: 33.3 mmHg
AV Peak grad: 59.2 mmHg
Ao pk vel: 3.85 m/s
Area-P 1/2: 3.65 cm2
Height: 68 in
P 1/2 time: 690 ms
S' Lateral: 5.5 cm
Weight: 3199.32 [oz_av]

## 2023-06-13 LAB — BASIC METABOLIC PANEL WITH GFR
Anion gap: 9 (ref 5–15)
BUN: 46 mg/dL — ABNORMAL HIGH (ref 8–23)
CO2: 26 mmol/L (ref 22–32)
Calcium: 8.7 mg/dL — ABNORMAL LOW (ref 8.9–10.3)
Chloride: 106 mmol/L (ref 98–111)
Creatinine, Ser: 1.73 mg/dL — ABNORMAL HIGH (ref 0.44–1.00)
GFR, Estimated: 31 mL/min — ABNORMAL LOW (ref >60)
Glucose, Bld: 127 mg/dL — ABNORMAL HIGH (ref 70–99)
Potassium: 3.8 mmol/L (ref 3.5–5.1)
Sodium: 141 mmol/L (ref 135–145)

## 2023-06-13 LAB — CBC
HCT: 32.1 % — ABNORMAL LOW (ref 36.0–46.0)
Hemoglobin: 9.5 g/dL — ABNORMAL LOW (ref 12.0–15.0)
MCH: 29.5 pg (ref 26.0–34.0)
MCHC: 29.6 g/dL — ABNORMAL LOW (ref 30.0–36.0)
MCV: 99.7 fL (ref 80.0–100.0)
Platelets: 265 10*3/uL (ref 150–400)
RBC: 3.22 MIL/uL — ABNORMAL LOW (ref 3.87–5.11)
RDW: 16.6 % — ABNORMAL HIGH (ref 11.5–15.5)
WBC: 6.4 10*3/uL (ref 4.0–10.5)
nRBC: 0 % (ref 0.0–0.2)

## 2023-06-13 LAB — MAGNESIUM: Magnesium: 2 mg/dL (ref 1.7–2.4)

## 2023-06-13 LAB — PHOSPHORUS: Phosphorus: 4.1 mg/dL (ref 2.5–4.6)

## 2023-06-13 LAB — GLUCOSE, CAPILLARY: Glucose-Capillary: 102 mg/dL — ABNORMAL HIGH (ref 70–99)

## 2023-06-13 MED ORDER — FUROSEMIDE 10 MG/ML IJ SOLN: 20.0000 mg | Freq: Two times a day (BID) | INTRAMUSCULAR | Status: DC

## 2023-06-13 MED ORDER — FUROSEMIDE 10 MG/ML IJ SOLN
20.0000 mg | Freq: Two times a day (BID) | INTRAMUSCULAR | Status: AC
  Administered 2023-06-13 (×2): 20 mg via INTRAVENOUS
  Filled 2023-06-13 (×2): qty 2

## 2023-06-13 MED ORDER — FUROSEMIDE 10 MG/ML IJ SOLN
40.0000 mg | Freq: Every day | INTRAMUSCULAR | Status: DC
  Administered 2023-06-14: 40 mg via INTRAVENOUS
  Filled 2023-06-13: qty 4

## 2023-06-13 MED ORDER — CARVEDILOL 6.25 MG PO TABS
6.2500 mg | ORAL_TABLET | Freq: Two times a day (BID) | ORAL | Status: DC
  Administered 2023-06-13 – 2023-06-17 (×10): 6.25 mg via ORAL
  Filled 2023-06-13 (×8): qty 1
  Filled 2023-06-13: qty 2
  Filled 2023-06-13 (×2): qty 1

## 2023-06-13 MED ORDER — ISOSORBIDE MONONITRATE ER 60 MG PO TB24
60.0000 mg | ORAL_TABLET | Freq: Every day | ORAL | Status: DC
  Administered 2023-06-14 – 2023-06-17 (×4): 60 mg via ORAL
  Filled 2023-06-13 (×4): qty 1

## 2023-06-13 MED ORDER — AMLODIPINE BESYLATE 10 MG PO TABS
10.0000 mg | ORAL_TABLET | Freq: Every day | ORAL | Status: DC
  Administered 2023-06-13 – 2023-06-14 (×2): 10 mg via ORAL
  Filled 2023-06-13: qty 2
  Filled 2023-06-13: qty 1

## 2023-06-13 MED ORDER — OLANZAPINE 5 MG PO TABS
5.0000 mg | ORAL_TABLET | Freq: Every day | ORAL | Status: DC
  Administered 2023-06-13 – 2023-06-18 (×6): 5 mg via ORAL
  Filled 2023-06-13 (×7): qty 1

## 2023-06-13 NOTE — ED Notes (Signed)
 Patient ambulated to and from commode in room.  Steady gait noted.  No reports of SHOB at this time

## 2023-06-13 NOTE — Evaluation (Signed)
 Physical Therapy Evaluation Patient Details Name: Erin Riley MRN: [id number] DOB: May 07, 1950 Today's Date: 06/13/2023  History of Present Illness  73 y.o. female presents to [facility] hospital on 06/12/2023 with progressively worsening dyspnea, BLE edema and orthopnea. Pt admitted for management of CHF exacerbation. PMH includes HTN, hypertrophic cardiomyopathy, PAD, CKD III, HLD, schizophrenia, AAA.  Clinical Impression  Pt presents to PT with deficits in activity tolerance, cardiopulmonary function, gait and balance. Pt is limited by DOE when mobilizing, with increased work of breathing with brief bouts of ambulation. Pt appears to have a very poor understanding of her congestive heart failure, asking many questions about why the fluid has accumulated in her legs and how it will all come out. PT provide brief education on the need for restriction of salt and fluid intake based on MD recommendations. Pt will benefit from frequent mobilization in an effort to improve activity tolerance and balance.         If plan is discharge home, recommend the following: A little help with bathing/dressing/bathroom;Assistance with cooking/housework;Assist for transportation;Help with stairs or ramp for entrance   Can travel by private vehicle        Equipment Recommendations Rollator (4 wheels)  Recommendations for Other Services       Functional Status Assessment Patient has had a recent decline in their functional status and demonstrates the ability to make significant improvements in function in a reasonable and predictable amount of time.     Precautions / Restrictions Precautions Precautions: Fall Recall of Precautions/Restrictions: Intact Restrictions Weight Bearing Restrictions Per Provider Order: No      Mobility  Bed Mobility Overal bed mobility: Modified Independent                  Transfers Overall transfer level: Needs assistance Equipment used: None Transfers: Sit to/from  Stand Sit to Stand: Supervision                Ambulation/Gait Ambulation/Gait assistance: Supervision Gait Distance (Feet): 15 Feet (15' x 2 to and from bathroom. pt refuses further ambulation after returning to stretcher) Assistive device: None Gait Pattern/deviations: Step-to pattern, Wide base of support Gait velocity: reduced Gait velocity interpretation: <1.8 ft/sec, indicate of risk for recurrent falls   General Gait Details: pt with increased lateral sway, slowed step-to gait  Stairs            Wheelchair Mobility     Tilt Bed    Modified Rankin (Stroke Patients Only)       Balance Overall balance assessment: Needs assistance Sitting-balance support: No upper extremity supported, Feet supported Sitting balance-Leahy Scale: Good     Standing balance support: No upper extremity supported, During functional activity Standing balance-Leahy Scale: Fair                               Pertinent Vitals/Pain Pain Assessment Pain Assessment: No/denies pain    Home Living Family/patient expects to be discharged to:: Private residence [facility], pt reports this is a senior living apartment but that she is the only resident) Living Arrangements: Children (son PRN) Available Help at Discharge: Family;Available PRN/intermittently Type of Home: Other(Comment) (Senior Living apartment) Home Access: Level entry       Home Layout: One level Home Equipment: Agricultural consultant (2 wheels);BSC/3in1      Prior Function Prior Level of Function : Independent/Modified Independent  Mobility Comments: pt ambulates without DME ADLs Comments: assist for transportation from son     Extremity/Trunk Assessment   Upper Extremity Assessment Upper Extremity Assessment: Overall WFL for tasks assessed    Lower Extremity Assessment Lower Extremity Assessment: Generalized weakness (pitting edema in BLE)    Cervical / Trunk  Assessment Cervical / Trunk Assessment: Normal  Communication   Communication Communication: No apparent difficulties    Cognition Arousal: Alert Behavior During Therapy: Impulsive   PT - Cognitive impairments: Awareness                       PT - Cognition Comments: pt appears to have very poor awareness of her medical condition, asking many questions about her edema in BLE and how this has happened despite having a history of CHF Following commands: Intact       Cueing Cueing Techniques: Verbal cues     General Comments General comments (skin integrity, edema, etc.): pt on RA during session. PT notes significant increase in work of breathing with mobility. portable pulse ox and pulse ox on hospital monitor both with inconsistencies in reading but ranging from 87-94% during session, and in low 90s once resting on stretcher at the end of session    Exercises     Assessment/Plan    PT Assessment Patient needs continued PT services  PT Problem List Decreased strength;Decreased activity tolerance;Decreased balance;Decreased mobility;Cardiopulmonary status limiting activity;Decreased knowledge of precautions;Decreased safety awareness       PT Treatment Interventions DME instruction;Gait training;Stair training;Functional mobility training;Balance training;Therapeutic activities;Therapeutic exercise;Neuromuscular re-education;Patient/family education    PT Goals (Current goals can be found in the Care Plan section)  Acute Rehab PT Goals Patient Stated Goal: to improve activity tolerance PT Goal Formulation: With patient Time For Goal Achievement: 06/27/23 Potential to Achieve Goals: Fair Additional Goals Additional Goal #1: Pt will report 1/4 DOE or less when mobilizing for >250' to demonstrate improved tolerance for community-level mobility    Frequency Min 2X/week     Co-evaluation               AM-PAC PT "6 Clicks" Mobility  Outcome Measure Help  needed turning from your back to your side while in a flat bed without using bedrails?: None Help needed moving from lying on your back to sitting on the side of a flat bed without using bedrails?: None Help needed moving to and from a bed to a chair (including a wheelchair)?: A Little Help needed standing up from a chair using your arms (e.g., wheelchair or bedside chair)?: A Little Help needed to walk in hospital room?: A Little Help needed climbing 3-5 steps with a railing? : A Little 6 Click Score: 20    End of Session Equipment Utilized During Treatment: Gait belt Activity Tolerance: Patient limited by fatigue Patient left: in bed;with call bell/phone within reach Nurse Communication: Mobility status PT Visit Diagnosis: Other abnormalities of gait and mobility (R26.89)    Time: 1359-1420 PT Time Calculation (min) (ACUTE ONLY): 21 min   Charges:   PT Evaluation $PT Eval Low Complexity: 1 Low   PT General Charges $$ ACUTE PT VISIT: 1 Visit         Arlyss Gandy, PT, DPT Acute Rehabilitation Office [phone]   Arlyss Gandy 06/13/2023, 4:37 PM

## 2023-06-13 NOTE — ED Notes (Signed)
 Patient's sheets changed and fresh linens applied.  Patient provided with call bell and instructed to let RN know when she needs to use the bathroom.  Understanding voiced

## 2023-06-13 NOTE — H&P (Signed)
 History and Physical  [name]:[id number] DOB: 03-19-1950 DOA: 06/12/2023  Referring physician: Elder Cyphers  PCP: Hoy Register, MD  Outpatient Specialists: Vascular surgery, behavioral health. Patient coming from: Home.  Chief Complaint: Shortness of breath.  HPI: Erin Riley is a 73 y.o. female with medical history significant for hypertension, hypertrophic cardiomyopathy, chronic combined diastolic and systolic CHF, PAD, CKD 3B, hyperlipidemia, schizophrenia, AAA who presents to the ER with complaints of progressively worsening dyspnea.  Associated with bilateral lower extremity edema and orthopnea.  Patient's symptoms have been to the point where she cannot walk without difficulty the past 2 days.  In the ER, tachypneic, hypertensive, with volume overload on exam, right JVD and bilateral lower extremity pitting edema.  Chest x-ray showing cardiomegaly with vascular congestion.  The patient received IV Lasix 40 mg x 1 in the ER as well as nitroglycerin ointment with improvement of her blood pressure.  BNP severely elevated greater than 3400.  Troponin 57, 61.  TRH, hospitalist service, asked to admit for further diuresing.  At the time of this visit, the patient is somnolent but arousable to voices and drifts back to sleep.  VBG with pH 7.381, pCO2 46, PO2 31.  Lab studies notable for WBC 7.2, hemoglobin 9.3, MCV 98, platelet count 313.   ED Course: Temperature 98.2.  BP 142/81, pulse 62, respiratory rate 21, O2 saturation 97% on 2 L.  Review of Systems: Review of systems as noted in the HPI. All other systems reviewed and are negative.   Past Medical History:  Diagnosis Date   Chronic back pain    Chronic kidney disease (CKD), stage II (mild)    Class I-II   Coronary artery disease 04/2009   50% stenosis in the perforator of LAD; catheterization was for an abnormal Myoview in January 2000 showing anterior and inferolateral ischemia.   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 > 3 subsequent Echos show improved/normal EF with moderate to severe concentric LVH and diastolic dysfunction with LVOT/intracavitary gradient --> 06/2016: Severe LVH.  Vigorous EF, 65-70%.?? Gr 1 DD. Mild AS.   Hyperlipidemia    Hypertension    Hypertensive hypertrophic cardiomyopathy: NYHA class II:  Echo: Severe concentric LVH with LV OT gradient; essentially preserved EF with diastolic dysfunction 02/15/2013   Echo 06/2016: Severe Concentric LVH. Vigorous EF 65-70%. ~ Gr I DD.    Mild aortic stenosis by prior echocardiography    Echo 06/2016: Mild AS (Mean Gradient 15 mmHg); has had prior Mod-Severe MR (not seen on current echo)   PAD (peripheral artery disease) (HCC) March 2013   Lower extremity Dopplers: R. SFA 50-60%, R. PTA proximally occluded with distal reconstitution;; L. common iliac ~50%, L. SFA 50-70% stenosis, L. PTA < 50%   Schizophrenia (HCC)    Past Surgical History:  Procedure Laterality Date   BUNIONECTOMY     carotid doppler  05/29/2011   left bulb/prox ICA moderate amtfibrous plaque with no evidence significant reduction.,right bulb /proximal ICA normal patency   lower extremity doppler  05/29/2011   right SFA 50% to 59% diameter reduction,right posterior tibal atreery occlusive disease,reconstituting distally, left common illiac<50%,left SFA 50 to70%,left post. tibial <50%   NM MYOCAR PERF WALL MOTION  03/2009   Persantine; EF 51%-both anterior and inferolateral ischemia   TRANSTHORACIC ECHOCARDIOGRAM  06/2016   Severe LVH.  Vigorous EF of 65-70%.  No  RWMA. ~Only grade 1 diastolic dysfunction.  Mild aortic stenosis (mean gradient 15 mmHg)   TRANSTHORACIC ECHOCARDIOGRAM  07/2012   EF 50-55%; severe concentric LVH; only grade 1 diastolic dysfunction. Mild aortic sclerosis - with LVOT /intracavitary gradient of roughly 20 mmHg mean. Mild to moderately  dilated LA;; previously reported MR not seen    Social History:  reports that she has been smoking cigarettes. She has never used smokeless tobacco. She reports that she does not drink alcohol and does not use drugs.   No Known Allergies  Family History  Problem Relation Age of Onset   Hypertension Mother    Breast cancer Neg Hx       Prior to Admission medications   Medication Sig Start Date End Date Taking? Authorizing Provider  amLODipine (NORVASC) 10 MG tablet Take 1 tablet (10 mg total) by mouth daily. Patient not taking: Reported on 01/03/2023 09/21/22   Hoy Register, MD  aspirin EC 81 MG tablet Take 1 tablet (81 mg total) by mouth daily. Swallow whole. Patient not taking: Reported on 01/03/2023 09/14/22   Leroy Sea, MD  atorvastatin (LIPITOR) 40 MG tablet Take 1 tablet (40 mg total) by mouth at bedtime. Patient not taking: Reported on 01/03/2023 09/21/22   Hoy Register, MD  carvedilol (COREG) 6.25 MG tablet Take 1 tablet (6.25 mg total) by mouth 2 (two) times daily with a meal. Patient not taking: Reported on 01/03/2023 09/21/22   Hoy Register, MD  empagliflozin (JARDIANCE) 10 MG TABS tablet Take 1 tablet (10 mg total) by mouth daily. Patient not taking: Reported on 01/03/2023 09/21/22   Hoy Register, MD  ferrous sulfate 325 (65 FE) MG tablet Take 1 tablet (325 mg total) by mouth daily with breakfast. Patient not taking: Reported on 01/03/2023 09/14/22   Leroy Sea, MD  furosemide (LASIX) 40 MG tablet Take 1 tablet (40 mg total) by mouth daily. 05/21/23   Kommor, [place], MD  hydrALAZINE (APRESOLINE) 100 MG tablet Take 1 tablet (100 mg total) by mouth every 8 (eight) hours. Patient not taking: Reported on 01/03/2023 09/21/22   Hoy Register, MD  isosorbide mononitrate (IMDUR) 60 MG 24 hr tablet Take 1 tablet (60 mg total) by mouth daily. Patient not taking: Reported on 01/03/2023 09/21/22   Hoy Register, MD  OLANZapine (ZYPREXA) 10 MG tablet Take 1  tablet (10 mg total) by mouth at bedtime. Patient not taking: Reported on 01/03/2023 09/14/22 09/14/23  Leroy Sea, MD  potassium chloride (KLOR-CON M) 10 MEQ tablet Take 1 tablet (10 mEq total) by mouth daily. Patient not taking: Reported on 01/03/2023 06/15/22   Sarina Ill, DO    Physical Exam: BP (!) 144/88   Pulse (!) 54   Temp 98.2 F (36.8 C) (Oral)   Resp 20   Ht 5\' 8"  (1.727 m)   Wt 90.7 kg   LMP 05/10/2013   SpO2 95%   BMI 30.40 kg/m   General: 73 y.o. year-old female well developed well nourished in no acute distress.  Somnolent but arousable to voices. Cardiovascular: Regular rate and rhythm with no rubs or gallops.  No thyromegaly or JVD noted.  2+ pitting edema lower extremities bilaterally.   Respiratory: Mild rales at bases.  Diffuse wheezing noted.  Poor inspiratory effort. Abdomen: Soft nontender nondistended with normal bowel sounds x4 quadrants. Muskuloskeletal: No cyanosis or clubbing noted bilaterally Neuro: CN II-XII intact, strength, sensation, reflexes Skin: No ulcerative lesions noted or rashes Psychiatry: Mood is appropriate for condition and  setting          Labs on Admission:  Basic Metabolic Panel: Recent Labs  Lab 06/12/23 1901 06/12/23 2025 06/12/23 2026 06/13/23 0322  NA 141 142 142 141  K 4.4 4.1 4.1 3.8  CL 105 104  --  106  CO2 25  --   --  26  GLUCOSE 94 95  --  127*  BUN 50* 45*  --  46*  CREATININE 1.81* 2.00*  --  1.73*  CALCIUM 9.0  --   --  8.7*  MG 2.1  --   --  2.0  PHOS  --   --   --  4.1   Liver Function Tests: Recent Labs  Lab 06/12/23 1901  AST 16  ALT 13  ALKPHOS 72  BILITOT 1.3*  PROT 6.9  ALBUMIN 3.2*   No results for input(s): "LIPASE", "AMYLASE" in the last 168 hours. No results for input(s): "AMMONIA" in the last 168 hours. CBC: Recent Labs  Lab 06/12/23 1901 06/12/23 2025 06/12/23 2026 06/13/23 0322  WBC 7.2  --   --  6.4  NEUTROABS 4.9  --   --   --   HGB 9.3* 10.9* 10.5*  9.5*  HCT 31.5* 32.0* 31.0* 32.1*  MCV 98.7  --   --  99.7  PLT 313  --   --  265   Cardiac Enzymes: No results for input(s): "CKTOTAL", "CKMB", "CKMBINDEX", "TROPONINI" in the last 168 hours.  BNP (last 3 results) Recent Labs    09/13/22 0154 05/21/23 1327 06/12/23 1852  BNP 67.2 1,469.6* 3,462.9*    ProBNP (last 3 results) No results for input(s): "PROBNP" in the last 8760 hours.  CBG: Recent Labs  Lab 06/12/23 1859  GLUCAP 90    Radiological Exams on Admission: DG Chest Port 1 View Result Date: 06/12/2023 CLINICAL DATA:  Dyspnea. EXAM: PORTABLE CHEST 1 VIEW COMPARISON:  05/21/2023 FINDINGS: Stable cardiomegaly. Mild hazy bibasilar airspace disease. Vascular congestion. No pneumothorax or large pleural effusion. Right humeral arthroplasty. IMPRESSION: Cardiomegaly with vascular congestion. Mild hazy bibasilar airspace disease may represent edema or atelectasis. Electronically Signed   By: Narda Rutherford M.D.   On: 06/12/2023 21:54    EKG: I independently viewed the EKG done and my findings are as followed: Normal sinus rhythm rate of 73.  Nonspecific ST-T changes.  QTC 467.  Assessment/Plan Present on Admission: **None**  Principal Problem:   Acute on chronic congestive heart failure (HCC)  Acute on chronic combined diastolic and systolic CHF Presented with progressive dyspnea, pitting edema in lower extremities, right JVD, BNP greater than 3400, cardiomegaly and pulmonary edema seen on chest x-ray. Last 2D echo done on 09/11/2022 revealed LVEF 40 to 45% with grade 1 diastolic dysfunction. IV diuresis started in the ER, continue Monitor strict I's and O's and daily weight.  Elevated troponin, suspect demand ischemia in the setting of acute on chronic CHF Troponin 57, 61 No evidence of acute ischemia seen on twelve-lead EKG Follow 2D echo and monitor on telemetry  Hypertension, BP is not at goal, elevated Suspect noncompliance to medical management Resume home  oral antihypertensives. Closely monitor vital signs.  Anemia of chronic disease in the setting of CKD 3B Hemoglobin on presentation 9.5 with MCV of 99.7 No overt bleeding reported Continue to monitor H&H  CKD 3B Appears to be at baseline Avoid nephrotoxic agents, and hypotension. Monitor urine output  Mood disorder/schizophrenia Resume home regimen once medications have been reconciled  Obesity BMI 30 Recommend  weight loss outpatient with regular physical activity and healthy dieting.  Generalized weakness PT OT assessment Fall precautions.   Critical care time: 55 minutes.   DVT prophylaxis: Subcu Lovenox daily.  Code Status: Full code.  Family Communication: None at bedside.  Disposition Plan: Admitted to telemetry cardiac unit.  Consults called: None.  Admission status: Inpatient status.   Status is: Inpatient The patient requires at least 2 midnights for further evaluation and treatment of present condition.   Darlin Drop MD [facility] Pager [phone]  If 7PM-7AM, please contact night-coverage [email or web address] Password [facility]  06/13/2023, 4:37 AM

## 2023-06-13 NOTE — ED Notes (Signed)
 Patient ambulating in the room with PT.

## 2023-06-13 NOTE — Progress Notes (Signed)
 Patient refused tele monitor, MD made aware via secure chart.

## 2023-06-13 NOTE — Progress Notes (Signed)
 PROGRESS NOTE    Erin Riley  WJX:[id number] DOB: June 18, 1950 DOA: 06/12/2023 PCP: Hoy Register, MD   Brief Narrative: Erin Riley is a 73 y.o. female with a history of hypertension, hypertrophic cardiomyopathy, chronic combined diastolic and systolic heart failure, PAD, CKD stage IIIb, hyperlipidemia, schizophrenia, AAA.  Patient presented secondary to progressively worsening dyspnea and found to have evidence of acute heart failure.  Patient started on IV Lasix for management.   Assessment/Plan:  Acute on chronic combined HFrEF Present on admission.  V EF of 40 to 45% with grade 1 diastolic dysfunction noted on echocardiogram from July 2024.  Patient with elevated BNP of 3463 with associated dyspnea, lower extremity swelling, vascular congestion with concern for possible pulmonary edema on chest x-ray.  Patient is managed on Lasix 40 mg daily and Jardiance 10 mg daily patient started on Lasix IV for management.  Patient already with some improvement since admission. -Continue Lasix IV -Daily weights and strict ins and outs -Follow-up transthoracic echocardiogram  Demand ischemia Elevated troponin of 57 with negative delta of 61.  No evidence of acute ischemia on EKG.  In setting of acute heart failure.  Primary hypertension Patient is on amlodipine, Coreg, Imdur, hydralazine as an outpatient. -Continue amlodipine, Coreg -Resume home Imdur  Anemia of chronic kidney disease Stable.  Diet on CKD stage IIIb Baseline creatinine of 1.4-1.5.  Creatinine of 1.81 on admission with acute worsening to 2.0.  Improving with IV diuresis.  Schizophrenia Mood disorder - Resume home Zyprexa  Obesity, class II Estimated body mass index is 36.05 kg/m as calculated from the following:   Height as of this encounter: 5\' 7"  (1.702 m).   Weight as of this encounter: 104.4 kg.   DVT prophylaxis: Lovenox Code Status:   Code Status: Full Code Family Communication: None at bedside Disposition  Plan: Discharge home pending improvement of heart failure symptoms, PT/OT, echocardiogram results   Consultants:    Procedures:    Antimicrobials:     Subjective: Patient reports some improvement in her dyspnea from last night.  Objective: BP (!) 160/92   Pulse 78   Temp 98.2 F (36.8 C) (Oral)   Resp (!) 26   Ht 5\' 8"  (1.727 m)   Wt 90.7 kg   LMP 05/10/2013   SpO2 99%   BMI 30.40 kg/m   Examination:  General exam: Appears calm and comfortable Respiratory system: Clear to auscultation. Respiratory effort normal. Cardiovascular system: S1 & S2 heard, RRR. Gastrointestinal system: Abdomen is nondistended, soft and nontender. Normal bowel sounds heard. Central nervous system: Alert and oriented. No focal neurological deficits. Musculoskeletal: No lower extremity pitting edema. No calf tenderness    Data Reviewed: I have personally reviewed following labs and imaging studies   Last CBC Lab Results  Component Value Date   WBC 6.4 06/13/2023   HGB 9.5 (L) 06/13/2023   HCT 32.1 (L) 06/13/2023   MCV 99.7 06/13/2023   MCH 29.5 06/13/2023   RDW 16.6 (H) 06/13/2023   PLT 265 06/13/2023     Last metabolic panel Lab Results  Component Value Date   GLUCOSE 127 (H) 06/13/2023   NA 141 06/13/2023   K 3.8 06/13/2023   CL 106 06/13/2023   CO2 26 06/13/2023   BUN 46 (H) 06/13/2023   CREATININE 1.73 (H) 06/13/2023   GFRNONAA 31 (L) 06/13/2023   CALCIUM 8.7 (L) 06/13/2023   PHOS 4.1 06/13/2023   PROT 6.9 06/12/2023   ALBUMIN 3.2 (L) 06/12/2023  LABGLOB 3.4 02/15/2021   AGRATIO 1.1 (L) 02/15/2021   BILITOT 1.3 (H) 06/12/2023   ALKPHOS 72 06/12/2023   AST 16 06/12/2023   ALT 13 06/12/2023   ANIONGAP 9 06/13/2023     Creatinine Clearance: Estimated Creatinine Clearance: 34.6 mL/min (A) (by C-G formula based on SCr of 1.73 mg/dL (H)).  Recent Results (from the past 240 hours)  Resp panel by RT-PCR (RSV, Flu A&B, Covid) Anterior Nasal Swab     Status: None    Collection Time: 06/12/23  7:31 PM   Specimen: Anterior Nasal Swab  Result Value Ref Range Status   SARS Coronavirus 2 by RT PCR NEGATIVE NEGATIVE Final   Influenza A by PCR NEGATIVE NEGATIVE Final   Influenza B by PCR NEGATIVE NEGATIVE Final    Comment: (NOTE) The Xpert Xpress SARS-CoV-2/FLU/RSV plus assay is intended as an aid in the diagnosis of influenza from Nasopharyngeal swab specimens and should not be used as a sole basis for treatment. Nasal washings and aspirates are unacceptable for Xpert Xpress SARS-CoV-2/FLU/RSV testing.  Fact Sheet for Patients: BloggerCourse.com  Fact Sheet for Healthcare Providers: SeriousBroker.it  This test is not yet approved or cleared by the [place] FDA and has been authorized for detection and/or diagnosis of SARS-CoV-2 by FDA under an Emergency Use Authorization (EUA). This EUA will remain in effect (meaning this test can be used) for the duration of the COVID-19 declaration under Section 564(b)(1) of the Act, 21 U.S.C. section 360bbb-3(b)(1), unless the authorization is terminated or revoked.     Resp Syncytial Virus by PCR NEGATIVE NEGATIVE Final    Comment: (NOTE) Fact Sheet for Patients: BloggerCourse.com  Fact Sheet for Healthcare Providers: SeriousBroker.it  This test is not yet approved or cleared by the [place] FDA and has been authorized for detection and/or diagnosis of SARS-CoV-2 by FDA under an Emergency Use Authorization (EUA). This EUA will remain in effect (meaning this test can be used) for the duration of the COVID-19 declaration under Section 564(b)(1) of the Act, 21 U.S.C. section 360bbb-3(b)(1), unless the authorization is terminated or revoked.  Performed at [facility], [address], [place], [place] [address]       Radiology Studies: DG Chest Port 1 View Result Date: 06/12/2023 CLINICAL  DATA:  Dyspnea. EXAM: PORTABLE CHEST 1 VIEW COMPARISON:  05/21/2023 FINDINGS: Stable cardiomegaly. Mild hazy bibasilar airspace disease. Vascular congestion. No pneumothorax or large pleural effusion. Right humeral arthroplasty. IMPRESSION: Cardiomegaly with vascular congestion. Mild hazy bibasilar airspace disease may represent edema or atelectasis. Electronically Signed   By: Narda Rutherford M.D.   On: 06/12/2023 21:54      LOS: 1 day    Jacquelin Hawking, MD [facility] 06/13/2023, 8:14 AM   If 7PM-7AM, please contact night-coverage [email or web address]

## 2023-06-13 NOTE — Plan of Care (Signed)
  Problem: Education: Goal: Knowledge of General Education information will improve Description: Including pain rating scale, medication(s)/side effects and non-pharmacologic comfort measures Outcome: Progressing   Problem: Education: Goal: Knowledge of General Education information will improve Description: Including pain rating scale, medication(s)/side effects and non-pharmacologic comfort measures Outcome: Progressing   Problem: Clinical Measurements: Goal: Ability to maintain clinical measurements within normal limits will improve Outcome: Progressing Goal: Will remain free from infection Outcome: Progressing Goal: Respiratory complications will improve Outcome: Progressing

## 2023-06-14 ENCOUNTER — Telehealth (HOSPITAL_COMMUNITY): Payer: Self-pay | Admitting: Pharmacy Technician

## 2023-06-14 ENCOUNTER — Other Ambulatory Visit (HOSPITAL_COMMUNITY): Payer: Self-pay

## 2023-06-14 DIAGNOSIS — N179 Acute kidney failure, unspecified: Secondary | ICD-10-CM | POA: Diagnosis not present

## 2023-06-14 DIAGNOSIS — I340 Nonrheumatic mitral (valve) insufficiency: Secondary | ICD-10-CM | POA: Diagnosis not present

## 2023-06-14 DIAGNOSIS — I071 Rheumatic tricuspid insufficiency: Secondary | ICD-10-CM | POA: Diagnosis not present

## 2023-06-14 DIAGNOSIS — I5023 Acute on chronic systolic (congestive) heart failure: Secondary | ICD-10-CM

## 2023-06-14 DIAGNOSIS — F209 Schizophrenia, unspecified: Secondary | ICD-10-CM

## 2023-06-14 DIAGNOSIS — I10 Essential (primary) hypertension: Secondary | ICD-10-CM | POA: Diagnosis not present

## 2023-06-14 DIAGNOSIS — N183 Chronic kidney disease, stage 3 unspecified: Secondary | ICD-10-CM

## 2023-06-14 DIAGNOSIS — I350 Nonrheumatic aortic (valve) stenosis: Secondary | ICD-10-CM | POA: Diagnosis not present

## 2023-06-14 DIAGNOSIS — I5043 Acute on chronic combined systolic (congestive) and diastolic (congestive) heart failure: Secondary | ICD-10-CM | POA: Diagnosis not present

## 2023-06-14 LAB — BASIC METABOLIC PANEL WITH GFR
Anion gap: 8 (ref 5–15)
BUN: 34 mg/dL — ABNORMAL HIGH (ref 8–23)
CO2: 29 mmol/L (ref 22–32)
Calcium: 8.8 mg/dL — ABNORMAL LOW (ref 8.9–10.3)
Chloride: 104 mmol/L (ref 98–111)
Creatinine, Ser: 1.47 mg/dL — ABNORMAL HIGH (ref 0.44–1.00)
GFR, Estimated: 38 mL/min — ABNORMAL LOW (ref >60)
Glucose, Bld: 101 mg/dL — ABNORMAL HIGH (ref 70–99)
Potassium: 3.6 mmol/L (ref 3.5–5.1)
Sodium: 141 mmol/L (ref 135–145)

## 2023-06-14 MED ORDER — POTASSIUM CHLORIDE CRYS ER 20 MEQ PO TBCR
40.0000 meq | EXTENDED_RELEASE_TABLET | Freq: Once | ORAL | Status: AC
  Administered 2023-06-14: 40 meq via ORAL
  Filled 2023-06-14: qty 2

## 2023-06-14 MED ORDER — HYDROCORTISONE (PERIANAL) 2.5 % EX CREA
TOPICAL_CREAM | Freq: Two times a day (BID) | CUTANEOUS | Status: DC
  Administered 2023-06-15: 1 via RECTAL
  Filled 2023-06-14: qty 28.35

## 2023-06-14 MED ORDER — ENOXAPARIN SODIUM 40 MG/0.4ML IJ SOSY
40.0000 mg | PREFILLED_SYRINGE | Freq: Every day | INTRAMUSCULAR | Status: DC
  Administered 2023-06-14 – 2023-06-19 (×6): 40 mg via SUBCUTANEOUS
  Filled 2023-06-14 (×6): qty 0.4

## 2023-06-14 MED ORDER — ORAL CARE MOUTH RINSE: 15.0000 mL | OROMUCOSAL | Status: DC | PRN

## 2023-06-14 MED ORDER — FUROSEMIDE 10 MG/ML IJ SOLN
80.0000 mg | Freq: Two times a day (BID) | INTRAMUSCULAR | Status: DC
  Administered 2023-06-14 – 2023-06-16 (×4): 80 mg via INTRAVENOUS
  Filled 2023-06-14 (×5): qty 8

## 2023-06-14 NOTE — Telephone Encounter (Signed)
 Patient Product/process development scientist completed.    The patient is insured through [facility]. Patient has Medicare and is not eligible for a copay card, but may be able to apply for patient assistance or Medicare RX Payment Plan (Patient Must reach out to their plan, if eligible for payment plan), if available.    Ran test claim for Entresto 24-26 mg and the current 30 day co-pay is $0.00.  Ran test claim for Farxiga 10 mg and the current 30 day co-pay is $0.00.  Ran test claim for Jardiance 10 mg and the current 30 day co-pay is $0.00.    This test claim was processed through [facility]- copay amounts may vary at other pharmacies due to pharmacy/plan contracts, or as the patient moves through the different stages of their insurance plan.     Roland Earl, CPHT Pharmacy Technician III Certified Patient Advocate [facility] Patient Advocate Team Direct Number: [phone]  Fax: [phone]

## 2023-06-14 NOTE — Progress Notes (Signed)
 PROGRESS NOTE    Erin Riley  ZOX:[id number] DOB: 12-19-1950 DOA: 06/12/2023 PCP: Erin Register, MD   Brief Narrative: Erin Riley is a 73 y.o. female with a history of hypertension, hypertrophic cardiomyopathy, chronic combined diastolic and systolic heart failure, PAD, CKD stage IIIb, hyperlipidemia, schizophrenia, AAA.  Patient presented secondary to progressively worsening dyspnea and found to have evidence of acute heart failure.  Patient started on IV Lasix for management.   Assessment/Plan:  Acute on chronic combined HFrEF Present on admission.  LVEF of 40 to 45% with grade 1 diastolic dysfunction noted on echocardiogram from July 2024.  Patient with elevated BNP of 3463 with associated dyspnea, lower extremity swelling, vascular congestion with concern for possible pulmonary edema on chest x-ray.  Patient is managed on Lasix 40 mg daily and Jardiance 10 mg daily patient started on Lasix IV for management.  Transthoracic Echocardiogram this admission significant for worsening LV function with EF of 25-30% and associated grade 2 diastolic dysfunction. Cardiology consulted. -Continue Lasix IV -Daily weights and strict ins and outs -Cardiology recommendations: medical management  Moderate MV/TV regurgitation Mild-moderate aortic valve stenosis Unlikely candidate for TAVR, per cardiology.  Demand ischemia Elevated troponin of 57 with negative delta of 61.  No evidence of acute ischemia on EKG.  In setting of acute heart failure.  Primary hypertension Patient is on amlodipine, Coreg, Imdur, hydralazine as an outpatient. -Continue amlodipine, Coreg -Continue Imdur  Anemia of chronic kidney disease Stable.  AKI on CKD stage IIIb Baseline creatinine of 1.4-1.5.  Creatinine of 1.81 on admission with acute worsening to 2.0.  Improving with IV diuresis.  Schizophrenia Mood disorder - Continue home Zyprexa  Obesity, class II Estimated body mass index is 35.43 kg/m as  calculated from the following:   Height as of this encounter: 5\' 7"  (1.702 m).   Weight as of this encounter: 102.6 kg.   DVT prophylaxis: Lovenox Code Status:   Code Status: Full Code Family Communication: None at bedside Disposition Plan: Discharge home pending improvement of heart failure symptoms, PT/OT, cardiology recommendations   Consultants:  Cardiology  Procedures:  Transthoracic Echocardiogram  Antimicrobials:  None   Subjective: Improvement in symptoms.  Objective: BP 123/86 (BP Location: Left Arm)   Pulse 60   Temp 97.7 F (36.5 C) (Oral)   Resp 20   Ht 5\' 7"  (1.702 m)   Wt 102.6 kg   LMP 05/10/2013   SpO2 95%   BMI 35.43 kg/m   Examination:  General exam: Appears calm and comfortable Respiratory system: Clear to auscultation. Respiratory effort normal. Cardiovascular system: S1 & S2 heard, RRR. Systolic murmur. Gastrointestinal system: Abdomen is nondistended, soft and nontender. Normal bowel sounds heard. Central nervous system: Alert and oriented. No focal neurological deficits. Musculoskeletal: 1+ BLE pitting edema. No calf tenderness Psychiatry: Judgement and insight appear normal. Mood & affect appropriate.     Data Reviewed: I have personally reviewed following labs and imaging studies   Last CBC Lab Results  Component Value Date   WBC 6.4 06/13/2023   HGB 9.5 (L) 06/13/2023   HCT 32.1 (L) 06/13/2023   MCV 99.7 06/13/2023   MCH 29.5 06/13/2023   RDW 16.6 (H) 06/13/2023   PLT 265 06/13/2023     Last metabolic panel Lab Results  Component Value Date   GLUCOSE 101 (H) 06/14/2023   NA 141 06/14/2023   K 3.6 06/14/2023   CL 104 06/14/2023   CO2 29 06/14/2023   BUN 34 (H) 06/14/2023  CREATININE 1.47 (H) 06/14/2023   GFRNONAA 38 (L) 06/14/2023   CALCIUM 8.8 (L) 06/14/2023   PHOS 4.1 06/13/2023   PROT 6.9 06/12/2023   ALBUMIN 3.2 (L) 06/12/2023   LABGLOB 3.4 02/15/2021   AGRATIO 1.1 (L) 02/15/2021   BILITOT 1.3 (H)  06/12/2023   ALKPHOS 72 06/12/2023   AST 16 06/12/2023   ALT 13 06/12/2023   ANIONGAP 8 06/14/2023     Creatinine Clearance: Estimated Creatinine Clearance: 42.6 mL/min (A) (by C-G formula based on SCr of 1.47 mg/dL (H)).  Recent Results (from the past 240 hours)  Resp panel by RT-PCR (RSV, Flu A&B, Covid) Anterior Nasal Swab     Status: None   Collection Time: 06/12/23  7:31 PM   Specimen: Anterior Nasal Swab  Result Value Ref Range Status   SARS Coronavirus 2 by RT PCR NEGATIVE NEGATIVE Final   Influenza A by PCR NEGATIVE NEGATIVE Final   Influenza B by PCR NEGATIVE NEGATIVE Final    Comment: (NOTE) The Xpert Xpress SARS-CoV-2/FLU/RSV plus assay is intended as an aid in the diagnosis of influenza from Nasopharyngeal swab specimens and should not be used as a sole basis for treatment. Nasal washings and aspirates are unacceptable for Xpert Xpress SARS-CoV-2/FLU/RSV testing.  Fact Sheet for Patients: BloggerCourse.com  Fact Sheet for Healthcare Providers: SeriousBroker.it  This test is not yet approved or cleared by the [place] FDA and has been authorized for detection and/or diagnosis of SARS-CoV-2 by FDA under an Emergency Use Authorization (EUA). This EUA will remain in effect (meaning this test can be used) for the duration of the COVID-19 declaration under Section 564(b)(1) of the Act, 21 U.S.C. section 360bbb-3(b)(1), unless the authorization is terminated or revoked.     Resp Syncytial Virus by PCR NEGATIVE NEGATIVE Final    Comment: (NOTE) Fact Sheet for Patients: BloggerCourse.com  Fact Sheet for Healthcare Providers: SeriousBroker.it  This test is not yet approved or cleared by the [place] FDA and has been authorized for detection and/or diagnosis of SARS-CoV-2 by FDA under an Emergency Use Authorization (EUA). This EUA will remain in effect  (meaning this test can be used) for the duration of the COVID-19 declaration under Section 564(b)(1) of the Act, 21 U.S.C. section 360bbb-3(b)(1), unless the authorization is terminated or revoked.  Performed at [facility], [address], [place], [place] [address]       Radiology Studies: ECHOCARDIOGRAM COMPLETE Result Date: 06/13/2023    ECHOCARDIOGRAM REPORT   Patient Name:   Erin Riley Date of Exam: 06/13/2023 Medical Rec #:  [id number]   Height:       68.0 in Accession #:    [id number]  Weight:       200.0 lb Date of Birth:  Feb 02, 1951   BSA:          2.044 m Patient Age:    73 years    BP:           113/87 mmHg Patient Gender: F           HR:           60 bpm. Exam Location:  Inpatient Procedure: 2D Echo, Cardiac Doppler and Color Doppler (Both Spectral and Color            Flow Doppler were utilized during procedure). Indications:    CHF  History:        Patient has prior history of Echocardiogram examinations, most  recent 09/11/2022. CHF, CAD, PAD, Aortic Valve Disease; Risk                 Factors:Hypertension and Dyslipidemia.  Sonographer:    Melton Krebs RDCS, FE, PE Referring Phys: [id number] CAROLE N HALL IMPRESSIONS  1. Left ventricular ejection fraction, by estimation, is 25 to 30%. The left ventricle has severely decreased function. The left ventricle demonstrates global hypokinesis. The left ventricular internal cavity size was severely dilated. There is severe left ventricular hypertrophy. Left ventricular diastolic parameters are consistent with Grade II diastolic dysfunction (pseudonormalization). Elevated left ventricular end-diastolic pressure.  2. Right ventricular systolic function is normal. The right ventricular size is moderately enlarged. There is severely elevated pulmonary artery systolic pressure. The estimated right ventricular systolic pressure is 66.3 mmHg.  3. Left atrial size was severely dilated.  4. Right atrial size was moderately dilated.  5. A  small pericardial effusion is present. There is no evidence of cardiac tamponade.  6. The mitral valve is grossly normal. Moderate mitral valve regurgitation. No evidence of mitral stenosis. Moderate mitral annular calcification.  7. Tricuspid valve regurgitation is moderate.  8. The aortic valve is calcified. There is severe calcifcation of the aortic valve. Aortic valve regurgitation is mild to moderate. Severe aortic valve stenosis. Aortic valve area, by VTI measures 0.95 cm. Aortic valve mean gradient measures 33.3 mmHg.  Aortic valve Vmax measures 3.85 m/s.  9. Aortic dilatation noted. There is borderline dilatation of the ascending aorta, measuring 39 mm. 10. The inferior vena cava is dilated in size with <50% respiratory variability, suggesting right atrial pressure of 15 mmHg. Comparison(s): Changes from prior study are noted. Conclusion(s)/Recommendation(s): Severe LVH, with worsening LV dilation and function. 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 Worsening MR and TR. Severe aortic stenosis by valve area. Findings communicated to Dr. Caleb Popp. FINDINGS  Left Ventricle: Left ventricular ejection fraction, by estimation, is 25 to 30%. The left ventricle has severely decreased function. The left ventricle demonstrates global hypokinesis. The left ventricular internal cavity size was severely dilated. There is severe left ventricular hypertrophy. Left ventricular diastolic parameters are consistent with Grade II diastolic dysfunction (pseudonormalization). Elevated left ventricular end-diastolic pressure. Right Ventricle: The right ventricular size is moderately enlarged. Right vetricular wall thickness was not well visualized. Right ventricular systolic function is normal. There is severely elevated pulmonary artery systolic pressure. The tricuspid regurgitant velocity is 3.58 m/s, and with an assumed right atrial pressure of 15 mmHg, the estimated right ventricular systolic pressure is 66.3 mmHg.  Left Atrium: Left atrial size was severely dilated. Right Atrium: Right atrial size was moderately dilated. Pericardium: A small pericardial effusion is present. There is no evidence of cardiac tamponade. Mitral Valve: The mitral valve is grossly normal. Moderate mitral annular calcification. Moderate mitral valve regurgitation. No evidence of mitral valve stenosis. Tricuspid Valve: The tricuspid valve is normal in structure. Tricuspid valve regurgitation is moderate . No evidence of tricuspid stenosis. Aortic Valve: The aortic valve is calcified. There is severe calcifcation of the aortic valve. Aortic valve regurgitation is mild to moderate. Aortic regurgitation PHT measures 690 msec. Severe aortic stenosis is present. Aortic valve mean gradient measures 33.3 mmHg. Aortic valve peak gradient measures 59.2 mmHg. Aortic valve area, by VTI measures 0.95 cm. Pulmonic Valve: The pulmonic valve was not well visualized. Pulmonic valve regurgitation is mild. No evidence of pulmonic stenosis. Aorta: Aortic dilatation noted. There is borderline dilatation of the ascending aorta, measuring 39 mm. Venous: The inferior vena cava is dilated  in size with less than 50% respiratory variability, suggesting right atrial pressure of 15 mmHg. IAS/Shunts: The atrial septum is grossly normal.  LEFT VENTRICLE PLAX 2D LVIDd:         6.30 cm   Diastology LVIDs:         5.50 cm   LV e' medial:    2.84 cm/s LV PW:         2.10 cm   LV E/e' medial:  29.3 LV IVS:        1.70 cm   LV e' lateral:   3.08 cm/s LVOT diam:     2.30 cm   LV E/e' lateral: 27.0 LV SV:         83 LV SV Index:   41 LVOT Area:     4.15 cm  RIGHT VENTRICLE RV Basal diam:  4.60 cm RV Mid diam:    3.50 cm RV S prime:     9.81 cm/s TAPSE (M-mode): 1.7 cm LEFT ATRIUM            Index        RIGHT ATRIUM           Index LA Vol (A2C): 65.2 ml  31.91 ml/m  RA Area:     21.90 cm LA Vol (A4C): 116.0 ml 56.76 ml/m  RA Volume:   65.10 ml  31.86 ml/m  AORTIC VALVE AV Area  (Vmax):    0.96 cm AV Area (Vmean):   0.88 cm AV Area (VTI):     0.95 cm AV Vmax:           384.67 cm/s AV Vmean:          269.000 cm/s AV VTI:            0.879 m AV Peak Grad:      59.2 mmHg AV Mean Grad:      33.3 mmHg LVOT Vmax:         89.30 cm/s LVOT Vmean:        57.200 cm/s LVOT VTI:          0.200 m LVOT/AV VTI ratio: 0.23 AI PHT:            690 msec  AORTA Ao Root diam: 3.00 cm Ao Asc diam:  3.90 cm MITRAL VALVE               TRICUSPID VALVE MV Area (PHT): 3.65 cm    TR Peak grad:   51.3 mmHg MV Decel Time: 208 msec    TR Vmax:        358.00 cm/s MV E velocity: 83.20 cm/s MV A velocity: 53.40 cm/s  SHUNTS MV E/A ratio:  1.56        Systemic VTI:  0.20 m                            Systemic Diam: 2.30 cm Jodelle Red MD Electronically signed by Jodelle Red MD Signature Date/Time: 06/13/2023/5:43:10 PM    Final    DG Chest Port 1 View Result Date: 06/12/2023 CLINICAL DATA:  Dyspnea. EXAM: PORTABLE CHEST 1 VIEW COMPARISON:  05/21/2023 FINDINGS: Stable cardiomegaly. Mild hazy bibasilar airspace disease. Vascular congestion. No pneumothorax or large pleural effusion. Right humeral arthroplasty. IMPRESSION: Cardiomegaly with vascular congestion. Mild hazy bibasilar airspace disease may represent edema or atelectasis. Electronically Signed   By: Narda Rutherford M.D.   On: 06/12/2023 21:54  LOS: 2 days    Jacquelin Hawking, MD [facility] 06/14/2023, 1:46 PM   If 7PM-7AM, please contact night-coverage [email or web address]

## 2023-06-14 NOTE — Evaluation (Signed)
 Occupational Therapy Evaluation Patient Details Name: Erin Riley MRN: [id number] DOB: 1950-12-21 Today's Date: 06/14/2023   History of Present Illness   73 y.o. female presents to [facility] hospital on 06/12/2023 with progressively worsening dyspnea, BLE edema and orthopnea. Pt admitted for management of CHF exacerbation. PMH includes HTN, hypertrophic cardiomyopathy, PAD, CKD III, HLD, schizophrenia, AAA.     Clinical Impressions PTA, pt lives alone in level entry apartment and reports typically Independent with ADLs, household IADLs and mobility without AD. Pt presenting fairly close to reported baseline with minor deficits in overall awareness/understanding of medical mgmt/CHF mgmt at home. Overall, pt able to manage ADLs/mobility in room without AD and without physical assistance. Pt denies concerns managing at home. Will follow for acute OT while admitted though no OT follow up needed at DC. Would recommend med mgmt assist at DC.      If plan is discharge home, recommend the following:   Direct supervision/assist for medications management;Assist for transportation     Functional Status Assessment   Patient has not had a recent decline in their functional status     Equipment Recommendations   None recommended by OT     Recommendations for Other Services         Precautions/Restrictions   Precautions Precautions: Fall Restrictions Weight Bearing Restrictions Per Provider Order: No     Mobility Bed Mobility Overal bed mobility: Independent                  Transfers Overall transfer level: Independent Equipment used: None Transfers: Sit to/from Stand Sit to Stand: Independent                  Balance Overall balance assessment: No apparent balance deficits (not formally assessed)                                         ADL either performed or assessed with clinical judgement   ADL Overall ADL's : Modified independent                                        General ADL Comments: able to mobilize in room without AD and without LOB. Pt declined to manage ADLs at sink and requested OT to provide washcloths to pt sitting EOB w/ pt able to wash face and manage peri care after purewick malfunction. Provided energy conservation handout (based on dyspnea on admission) for ADL/IADL task modification as well as CHF and Self Care handout due to noted decreased knowledge regarding CHF dx with PT eval yesterday     Vision Ability to See in Adequate Light: 0 Adequate Patient Visual Report: No change from baseline Vision Assessment?: No apparent visual deficits     Perception         Praxis         Pertinent Vitals/Pain Pain Assessment Pain Assessment: No/denies pain     Extremity/Trunk Assessment Upper Extremity Assessment Upper Extremity Assessment: Overall WFL for tasks assessed;Right hand dominant   Lower Extremity Assessment Lower Extremity Assessment: Defer to PT evaluation   Cervical / Trunk Assessment Cervical / Trunk Assessment: Normal   Communication Communication Communication: No apparent difficulties   Cognition Arousal: Alert Behavior During Therapy: WFL for tasks assessed/performed Cognition: History of cognitive impairments  OT - Cognition Comments: hx of schizophrenia, likely at baseline cogntiive. decreased understanding of OT eval purpose, health literacy opportunities. perseverating on "not needing anyone to give me a bath" after OT attempts to educate on OT purpose                 Following commands: Intact       Cueing  General Comments   Cueing Techniques: Verbal cues  SpO2 95% on RA post brief activity   Exercises     Shoulder Instructions      Home Living Family/patient expects to be discharged to:: Private residence Living Arrangements: Alone Available Help at Discharge: Family;Available PRN/intermittently Type of Home:  Apartment Home Access: Level entry     Home Layout: One level         Bathroom Toilet: Standard     Home Equipment: Agricultural consultant (2 wheels);BSC/3in1          Prior Functioning/Environment Prior Level of Function : Independent/Modified Independent             Mobility Comments: pt ambulates without DME ADLs Comments: reports Independent with ADLs, household iADls. Son assists with transportation. pt reports using pill box for med mgmt though unsure of med compliance at home    OT Problem List: Decreased activity tolerance;Decreased safety awareness;Decreased knowledge of precautions   OT Treatment/Interventions: Self-care/ADL training;Therapeutic exercise;Energy conservation;DME and/or AE instruction;Therapeutic activities;Patient/family education;Balance training      OT Goals(Current goals can be found in the care plan section)   Acute Rehab OT Goals Patient Stated Goal: home soon OT Goal Formulation: With patient Time For Goal Achievement: 06/28/23 Potential to Achieve Goals: Good   OT Frequency:  Min 1X/week    Co-evaluation              AM-PAC OT "6 Clicks" Daily Activity     Outcome Measure Help from another person eating meals?: None Help from another person taking care of personal grooming?: None Help from another person toileting, which includes using toliet, bedpan, or urinal?: None Help from another person bathing (including washing, rinsing, drying)?: None Help from another person to put on and taking off regular upper body clothing?: None Help from another person to put on and taking off regular lower body clothing?: None 6 Click Score: 24   End of Session Nurse Communication: Mobility status  Activity Tolerance: Patient tolerated treatment well Patient left: in chair;with call bell/phone within reach;with chair alarm set;Other (comment) (no nurse call cord attached, would only alarm in room - RN aware)  OT Visit Diagnosis: Other  symptoms and signs involving cognitive function                Time: 1610-9604 OT Time Calculation (min): 18 min Charges:  OT General Charges $OT Visit: 1 Visit OT Evaluation $OT Eval Low Complexity: 1 Low  Bradd Canary, OTR/L Acute Rehab Services Office: [phone]   Lorre Munroe 06/14/2023, 8:26 AM

## 2023-06-14 NOTE — Consult Note (Addendum)
 Advanced Heart Failure Team Consult Note   Primary Physician: Hoy Register, MD Cardiologist:  Bryan Lemma, MD  Reason for Consultation: Acute on Chronic Systolic Heart Failure   HPI:    AMELIYA NICOTRA is seen today for evaluation of acute on chronic systolic heart failure at the request of Dr. Jena Gauss, Internal Medicine.   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 She has never had cMRI. She did have prior LHC in 2011 that showed mild nonobstructive CAD. Also w/ mod-severe AS but not referred for valve replacement surgery given concern regarding physiatric issues (schizophrenia) and poor compliance. Other medical issues include HTN, CKD III, HLD and PAD/ thoracoabdominal aortic aneurysms followed by [facility]. Last CT was 7/24 (see below).   Most recent echo, prior to this admit, was 09/2022. EF at that time was 40-45%, severe concentric LVH, normal RV, tricuspid AoV w/ severe calcification and stenosis w/ mean gradient 37 mmHg and near critical AVA of 0.84 cm2. EF had been as low as 25-30% 03/2022.   Pt presented to the ED on 4/8 w/ complaints of progressive dyspnea, wt gain and LEE. Found to be in acute CHF, hypertensive and w/ AKI. SCr elevated at 2.0 (baseline 1.5), BNP 3400. Hs trop 57>61. CXR w/ cardiomegaly and vascular congestion. EKG NSR 74 bpm w/ LVH.   She was admitted and started on IV Lasix + amlodipine, Coreg and Imdur for BP. Echo repeated and showed drop in LVEF, now 25-30%, LV severely dilated w/ severe LVH, GIIDD, RV normal w/ severely elevated RVSP 66 mmhg, severe AS mean gradient 33.23mmHg, mild-mod AI, IVC dilated w/ assumed RAP 15 mmHg.   Wt down 4 lb since admit. SCr improved and back to baseline at 1.47 today. BP improving.     Pt lacks good insight. Lives in an apartment alone. Says she has no  family locally but keeps telling me that she needs to get back to her baby whom she keeps saying she just gave birth to.     Echo 06/13/23 1. Left ventricular ejection fraction, by estimation, is 25 to 30%. The  left ventricle has severely decreased function. The left ventricle  demonstrates global hypokinesis. The left ventricular internal cavity size  was severely dilated. There is severe  left ventricular hypertrophy. Left ventricular diastolic parameters are  consistent with Grade II diastolic dysfunction (pseudonormalization).  Elevated left ventricular end-diastolic pressure.   2. Right ventricular systolic function is normal. The right ventricular  size is moderately enlarged. There is severely elevated pulmonary artery  systolic pressure. The estimated right ventricular systolic pressure is  66.3 mmHg.   3. Left atrial size was severely dilated.   4. Right atrial size was moderately dilated.   5. A small pericardial effusion is present. There is no evidence of  cardiac tamponade.   6. The mitral valve is grossly normal. Moderate mitral valve  regurgitation. No evidence of mitral stenosis. Moderate mitral annular  calcification.   7. Tricuspid valve regurgitation is moderate.   8. The aortic valve is calcified. There is severe calcifcation of the  aortic valve. Aortic valve regurgitation is mild to moderate. Severe  aortic valve stenosis. Aortic valve area, by VTI measures 0.95 cm. Aortic  valve mean gradient measures 33.3 mmHg.   Aortic valve Vmax measures 3.85 m/s.   9. Aortic  dilatation noted. There is borderline dilatation of the  ascending aorta, measuring 39 mm.  10. The inferior vena cava is dilated in size with <50% respiratory  variability, suggesting right atrial pressure of 15 mmHg.   Home Medications Prior to Admission medications   Medication Sig Start Date End Date Taking? Authorizing Provider  amLODipine (NORVASC) 10 MG tablet Take 1 tablet (10 mg total) by  mouth daily. 09/21/22  Yes Hoy Register, MD  aspirin EC 81 MG tablet Take 1 tablet (81 mg total) by mouth daily. Swallow whole. 09/14/22  Yes Leroy Sea, MD  atorvastatin (LIPITOR) 40 MG tablet Take 1 tablet (40 mg total) by mouth at bedtime. 09/21/22  Yes Hoy Register, MD  carvedilol (COREG) 6.25 MG tablet Take 1 tablet (6.25 mg total) by mouth 2 (two) times daily with a meal. 09/21/22  Yes Newlin, Enobong, MD  empagliflozin (JARDIANCE) 10 MG TABS tablet Take 1 tablet (10 mg total) by mouth daily. 09/21/22  Yes Hoy Register, MD  ferrous sulfate 325 (65 FE) MG tablet Take 1 tablet (325 mg total) by mouth daily with breakfast. 09/14/22  Yes Leroy Sea, MD  furosemide (LASIX) 40 MG tablet Take 1 tablet (40 mg total) by mouth daily. 05/21/23  Yes Kommor, Madison, MD  hydrALAZINE (APRESOLINE) 100 MG tablet Take 1 tablet (100 mg total) by mouth every 8 (eight) hours. Patient taking differently: Take 100 mg by mouth 3 (three) times daily. 09/21/22  Yes Hoy Register, MD  isosorbide mononitrate (IMDUR) 60 MG 24 hr tablet Take 1 tablet (60 mg total) by mouth daily. 09/21/22  Yes Newlin, Odette Horns, MD  OLANZapine (ZYPREXA) 5 MG tablet Take 5 mg by mouth at bedtime. 05/23/23  Yes [provider]  potassium chloride (KLOR-CON M) 10 MEQ tablet Take 1 tablet (10 mEq total) by mouth daily. 06/15/22  Yes Sarina Ill, DO    Past Medical History: Past Medical History:  Diagnosis Date   Chronic back pain    Chronic kidney disease (CKD), stage II (mild)    Class I-II   Coronary artery disease 04/2009   50% stenosis in the perforator of LAD; catheterization was for an abnormal Myoview in January 2000 showing anterior and inferolateral ischemia.   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 > 3 subsequent Echos show improved/normal EF with moderate to severe concentric LVH  and diastolic dysfunction with LVOT/intracavitary gradient --> 06/2016: Severe LVH.  Vigorous EF, 65-70%.?? Gr 1 DD. Mild AS.   Hyperlipidemia    Hypertension    Hypertensive hypertrophic cardiomyopathy: NYHA class II:  Echo: Severe concentric LVH with LV OT gradient; essentially preserved EF with diastolic dysfunction 02/15/2013   Echo 06/2016: Severe Concentric LVH. Vigorous EF 65-70%. ~ Gr I DD.    Mild aortic stenosis by prior echocardiography    Echo 06/2016: Mild AS (Mean Gradient 15 mmHg); has had prior Mod-Severe MR (not seen on current echo)   PAD (peripheral artery disease) (HCC) March 2013   Lower extremity Dopplers: R. SFA 50-60%, R. PTA proximally occluded with distal reconstitution;; L. common iliac ~50%, L. SFA 50-70% stenosis, L. PTA < 50%   Schizophrenia (HCC)     Past Surgical History: Past Surgical History:  Procedure Laterality Date   BUNIONECTOMY     carotid doppler  05/29/2011   left bulb/prox ICA moderate amtfibrous plaque with no evidence significant reduction.,right bulb Jake Seats  ICA normal patency   lower extremity doppler  05/29/2011   right SFA 50% to 59% diameter reduction,right posterior tibal atreery occlusive disease,reconstituting distally, left common illiac<50%,left SFA 50 to70%,left post. tibial <50%   NM MYOCAR PERF WALL MOTION  03/2009   Persantine; EF 51%-both anterior and inferolateral ischemia   TRANSTHORACIC ECHOCARDIOGRAM  06/2016   Severe LVH.  Vigorous EF of 65-70%.  No RWMA. ~Only grade 1 diastolic dysfunction.  Mild aortic stenosis (mean gradient 15 mmHg)   TRANSTHORACIC ECHOCARDIOGRAM  07/2012   EF 50-55%; severe concentric LVH; only grade 1 diastolic dysfunction. Mild aortic sclerosis - with LVOT /intracavitary gradient of roughly 20 mmHg mean. Mild to moderately dilated LA;; previously reported MR not seen    Family History: Family History  Problem Relation Age of Onset   Hypertension Mother    Breast cancer Neg Hx     Social  History: Social History   Socioeconomic History   Marital status: Single    Spouse name: Not on file   Number of children: 7   Years of education: Not on file   Highest education level: Not on file  Occupational History   Not on file  Tobacco Use   Smoking status: Some Days    Current packs/day: 0.00    Types: Cigarettes    Last attempt to quit: 05/14/2002    Years since quitting: 21.0   Smokeless tobacco: Never  Vaping Use   Vaping status: Never Used  Substance and Sexual Activity   Alcohol use: No   Drug use: No   Sexual activity: Yes    Birth control/protection: Post-menopausal, None  Other Topics Concern   Not on file  Social History Narrative   Now single mother of 2 with one grandchild. She quit smoking roughly 5 years ago and is not so since. She has also stopped drinking alcohol. She does try get routine exercise walking at least a mile 3-4 days a week.    She lives with her 73 year old mother. 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   Social Drivers of Corporate investment banker Strain: Not on file  Food Insecurity: No Food Insecurity (06/13/2023)   Hunger Vital Sign    Worried About Running Out of Food in the Last Year: Never true    Ran Out of Food in the Last Year: Never true  Transportation Needs: No Transportation Needs (06/13/2023)   PRAPARE - Administrator, Civil Service (Medical): No    Lack of Transportation (Non-Medical): No  Physical Activity: Not on file  Stress: Not on file  Social Connections: Moderately Isolated (06/13/2023)   Social Connection and Isolation Panel [NHANES]    Frequency of Communication with Friends and Family: More than three times a week    Frequency of Social Gatherings with Friends and Family: More than three times a week    Attends Religious Services: More than 4 times per year    Active Member of Clubs or Organizations: No    Attends Banker Meetings: Never    Marital Status: Divorced    Allergies:  No  Known Allergies  Objective:    Vital Signs:   Temp:  [97.2 F (36.2 C)-97.8 F (36.6 C)] 97.7 F (36.5 C) (04/10 0728) Pulse Rate:  [60-73] 60 (04/10 1130) Resp:  [20-38] 20 (04/10 1130) BP: (123-163)/(81-112) 123/86 (04/10 1130) SpO2:  [93 %-100 %] 95 % (04/10 1130) Weight:  [102.6 kg-104.4 kg] 102.6 kg (04/10 0500) Last BM Date :  06/13/23  Weight change: Filed Weights   06/13/23 1519 06/13/23 1522 06/14/23 0500  Weight: 104.4 kg 104.4 kg 102.6 kg    Intake/Output:   Intake/Output Summary (Last 24 hours) at 06/14/2023 1156 Last data filed at 06/14/2023 1000 Gross per 24 hour  Intake 594 ml  Output 1250 ml  Net -656 ml      Physical Exam    General: chronically ill/unkept appearing, sitting up in chair. No respiratory difficulty  HEENT: normal Neck: supple. JVP 10 cm . Carotids 2+ bilat; no bruits. No lymphadenopathy or thyromegaly appreciated. Cor: PMI nondisplaced. 3/6 harsh AS murmur loudest RUSB + radiates to back  Lungs: clear Abdomen: soft, nontender, nondistended. No hepatosplenomegaly. No bruits or masses. Good bowel sounds. Extremities: no cyanosis, clubbing, rash, trace-1+ b/l LEE edema Neuro: alert & orientedx3, Moves all 4 extremities w/o difficulty. Affect pleasant   Telemetry   NSR 90s w/ occasional PVCs, personally reviewed   EKG    NSR 74 bpm w/ LVH, personally reviewed   Labs   Basic Metabolic Panel: Recent Labs  Lab 06/12/23 1901 06/12/23 2025 06/12/23 2026 06/13/23 0322 06/14/23 0324  NA 141 142 142 141 141  K 4.4 4.1 4.1 3.8 3.6  CL 105 104  --  106 104  CO2 25  --   --  26 29  GLUCOSE 94 95  --  127* 101*  BUN 50* 45*  --  46* 34*  CREATININE 1.81* 2.00*  --  1.73* 1.47*  CALCIUM 9.0  --   --  8.7* 8.8*  MG 2.1  --   --  2.0  --   PHOS  --   --   --  4.1  --     Liver Function Tests: Recent Labs  Lab 06/12/23 1901  AST 16  ALT 13  ALKPHOS 72  BILITOT 1.3*  PROT 6.9  ALBUMIN 3.2*   No results for input(s):  "LIPASE", "AMYLASE" in the last 168 hours. No results for input(s): "AMMONIA" in the last 168 hours.  CBC: Recent Labs  Lab 06/12/23 1901 06/12/23 2025 06/12/23 2026 06/13/23 0322  WBC 7.2  --   --  6.4  NEUTROABS 4.9  --   --   --   HGB 9.3* 10.9* 10.5* 9.5*  HCT 31.5* 32.0* 31.0* 32.1*  MCV 98.7  --   --  99.7  PLT 313  --   --  265    Cardiac Enzymes: No results for input(s): "CKTOTAL", "CKMB", "CKMBINDEX", "TROPONINI" in the last 168 hours.  BNP: BNP (last 3 results) Recent Labs    09/13/22 0154 05/21/23 1327 06/12/23 1852  BNP 67.2 1,469.6* 3,462.9*    ProBNP (last 3 results) No results for input(s): "PROBNP" in the last 8760 hours.   CBG: Recent Labs  Lab 06/12/23 1859 06/13/23 1522  GLUCAP 90 102*    Coagulation Studies: No results for input(s): "LABPROT", "INR" in the last 72 hours.   Imaging   ECHOCARDIOGRAM COMPLETE Result Date: 06/13/2023    ECHOCARDIOGRAM REPORT   Patient Name:   YULISSA NEEDHAM [place] Date of Exam: 06/13/2023 Medical Rec #:  [id number]   Height:       68.0 in Accession #:    [id number]  Weight:       200.0 lb Date of Birth:  08-09-1950   BSA:          2.044 m Patient Age:    73 years    BP:  113/87 mmHg Patient Gender: F           HR:           60 bpm. Exam Location:  Inpatient Procedure: 2D Echo, Cardiac Doppler and Color Doppler (Both Spectral and Color            Flow Doppler were utilized during procedure). Indications:    CHF  History:        Patient has prior history of Echocardiogram examinations, most                 recent 09/11/2022. CHF, CAD, PAD, Aortic Valve Disease; Risk                 Factors:Hypertension and Dyslipidemia.  Sonographer:    Melton Krebs RDCS, FE, PE Referring Phys: [id number] CAROLE N HALL IMPRESSIONS  1. Left ventricular ejection fraction, by estimation, is 25 to 30%. The left ventricle has severely decreased function. The left ventricle demonstrates global hypokinesis. The left ventricular internal cavity size was  severely dilated. There is severe left ventricular hypertrophy. Left ventricular diastolic parameters are consistent with Grade II diastolic dysfunction (pseudonormalization). Elevated left ventricular end-diastolic pressure.  2. Right ventricular systolic function is normal. The right ventricular size is moderately enlarged. There is severely elevated pulmonary artery systolic pressure. The estimated right ventricular systolic pressure is 66.3 mmHg.  3. Left atrial size was severely dilated.  4. Right atrial size was moderately dilated.  5. A small pericardial effusion is present. There is no evidence of cardiac tamponade.  6. The mitral valve is grossly normal. Moderate mitral valve regurgitation. No evidence of mitral stenosis. Moderate mitral annular calcification.  7. Tricuspid valve regurgitation is moderate.  8. The aortic valve is calcified. There is severe calcifcation of the aortic valve. Aortic valve regurgitation is mild to moderate. Severe aortic valve stenosis. Aortic valve area, by VTI measures 0.95 cm. Aortic valve mean gradient measures 33.3 mmHg.  Aortic valve Vmax measures 3.85 m/s.  9. Aortic dilatation noted. There is borderline dilatation of the ascending aorta, measuring 39 mm. 10. The inferior vena cava is dilated in size with <50% respiratory variability, suggesting right atrial pressure of 15 mmHg. Comparison(s): Changes from prior study are noted. Conclusion(s)/Recommendation(s): Severe LVH, with worsening LV dilation and function. 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 Worsening MR and TR. Severe aortic stenosis by valve area. Findings communicated to Dr. Caleb Popp. FINDINGS  Left Ventricle: Left ventricular ejection fraction, by estimation, is 25 to 30%. The left ventricle has severely decreased function. The left ventricle demonstrates global hypokinesis. The left ventricular internal cavity size was severely dilated. There is severe left ventricular hypertrophy. Left  ventricular diastolic parameters are consistent with Grade II diastolic dysfunction (pseudonormalization). Elevated left ventricular end-diastolic pressure. Right Ventricle: The right ventricular size is moderately enlarged. Right vetricular wall thickness was not well visualized. Right ventricular systolic function is normal. There is severely elevated pulmonary artery systolic pressure. The tricuspid regurgitant velocity is 3.58 m/s, and with an assumed right atrial pressure of 15 mmHg, the estimated right ventricular systolic pressure is 66.3 mmHg. Left Atrium: Left atrial size was severely dilated. Right Atrium: Right atrial size was moderately dilated. Pericardium: A small pericardial effusion is present. There is no evidence of cardiac tamponade. Mitral Valve: The mitral valve is grossly normal. Moderate mitral annular calcification. Moderate mitral valve regurgitation. No evidence of mitral valve stenosis. Tricuspid Valve: The tricuspid valve is normal in structure. Tricuspid valve regurgitation is moderate . No evidence  of tricuspid stenosis. Aortic Valve: The aortic valve is calcified. There is severe calcifcation of the aortic valve. Aortic valve regurgitation is mild to moderate. Aortic regurgitation PHT measures 690 msec. Severe aortic stenosis is present. Aortic valve mean gradient measures 33.3 mmHg. Aortic valve peak gradient measures 59.2 mmHg. Aortic valve area, by VTI measures 0.95 cm. Pulmonic Valve: The pulmonic valve was not well visualized. Pulmonic valve regurgitation is mild. No evidence of pulmonic stenosis. Aorta: Aortic dilatation noted. There is borderline dilatation of the ascending aorta, measuring 39 mm. Venous: The inferior vena cava is dilated in size with less than 50% respiratory variability, suggesting right atrial pressure of 15 mmHg. IAS/Shunts: The atrial septum is grossly normal.  LEFT VENTRICLE PLAX 2D LVIDd:         6.30 cm   Diastology LVIDs:         5.50 cm   LV e'  medial:    2.84 cm/s LV PW:         2.10 cm   LV E/e' medial:  29.3 LV IVS:        1.70 cm   LV e' lateral:   3.08 cm/s LVOT diam:     2.30 cm   LV E/e' lateral: 27.0 LV SV:         83 LV SV Index:   41 LVOT Area:     4.15 cm  RIGHT VENTRICLE RV Basal diam:  4.60 cm RV Mid diam:    3.50 cm RV S prime:     9.81 cm/s TAPSE (M-mode): 1.7 cm LEFT ATRIUM            Index        RIGHT ATRIUM           Index LA Vol (A2C): 65.2 ml  31.91 ml/m  RA Area:     21.90 cm LA Vol (A4C): 116.0 ml 56.76 ml/m  RA Volume:   65.10 ml  31.86 ml/m  AORTIC VALVE AV Area (Vmax):    0.96 cm AV Area (Vmean):   0.88 cm AV Area (VTI):     0.95 cm AV Vmax:           384.67 cm/s AV Vmean:          269.000 cm/s AV VTI:            0.879 m AV Peak Grad:      59.2 mmHg AV Mean Grad:      33.3 mmHg LVOT Vmax:         89.30 cm/s LVOT Vmean:        57.200 cm/s LVOT VTI:          0.200 m LVOT/AV VTI ratio: 0.23 AI PHT:            690 msec  AORTA Ao Root diam: 3.00 cm Ao Asc diam:  3.90 cm MITRAL VALVE               TRICUSPID VALVE MV Area (PHT): 3.65 cm    TR Peak grad:   51.3 mmHg MV Decel Time: 208 msec    TR Vmax:        358.00 cm/s MV E velocity: 83.20 cm/s MV A velocity: 53.40 cm/s  SHUNTS MV E/A ratio:  1.56        Systemic VTI:  0.20 m  Systemic Diam: 2.30 cm Jodelle Red MD Electronically signed by Jodelle Red MD Signature Date/Time: 06/13/2023/5:43:10 PM    Final      Medications:     Current Medications:  amLODipine  10 mg Oral Daily   carvedilol  6.25 mg Oral BID WC   enoxaparin (LOVENOX) injection  40 mg Subcutaneous Daily   furosemide  40 mg Intravenous Daily   isosorbide mononitrate  60 mg Oral Daily   OLANZapine  5 mg Oral QHS    Infusions:     Patient Profile   73 y/o AAF w/ chronic combined systolic and diastolic heart failure, severe aortic stenosis, uncontrolled HTN, CKD III, PVD, schizophrenia and poor health literacy and lack of social support impacting  compliance, admitted w/ acute on chronic systolic/diastolic heart failure.   Assessment/Plan   1. Acute on Chronic Systolic and Diastolic Heart Failure - felt to be hypertensive heart disease, long h/o severe LVH and DD, in the setting of poorly controlled HTN. EF as low as 25-35% by echo in 03/2022 but improved on f/u echo 7/24, up to 40-45% - Echo this admit EF 25-30%, severe LVH, GIIDD, RV normal, severe AS  - valvular heart disease 2/2 severe AS as well as possible infiltrative CM also possible etiologies. She has never had cMRI but I doubt she will comply w/ study given psych issues - Think medical management is best option for her. Not candidate for valvular surgery nor advanced therapies given psych issues, noncompliance and lack of social support  - for now, continue to diuresis. IV Lasix 40 mg bid  - getting on good GDMT will likely be challenging. Would be hesitant to add spiro, digoxin and bid/tid meds w/ poor compliance - continue once daily Imdur - consider switching Coreg to Toprol XL to simplify regimen  - ok to continue amlodipine  - careful not to drop BP too low w/ severe AS  - add TED hoses to help w/ LEE  2. Severe AS - echo 7/24 showed near critical AS w/ AVA 0.84 cm2  - echo this admit w/ mean Gradient 33 mmHg, suspect gradient low in setting of severe LV dysfunction - she is not surgical candidate given above issues - doubt she would be suitable TAVR candidate given known PAD/ poor access options and poor compliance   3. HTN - elevated on admit, in setting of med noncompliance - improved w/ HF GDMT/ antihypertensives  - continue per above   4. 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 Schizophrenia  - poor insight and affects compliance - per IM    Length of Stay: 2  Robbie Lis, PA-C  06/14/2023, 11:56 AM  Advanced Heart Failure Team Pager  [phone] (M-F; 7a - 5p)  Please contact [facility] for night-coverage after hours (4p -7a ) and weekends on [email or web address]  Patient seen with NP, I formulated the plan and agree with the above note.   Patient has history of nonischemic cardiomyopathy (cath in 2011) with severe LVH.  This has been thought to be due to hypertensive heart disease versus hypertrophic cardiomyopathy (or cardiac amyloidosis).  She also has known severe aortic stenosis. She has schizophrenia, management has been limited by poor compliance and lack of outpatient followup.  Not seen in cardiology office since 2020.   She was admitted with dyspnea and peripheral edema.  Echo this admission was reviewed, showed EF 25-30%, severe LV dilation, severe concentric LVH, mild RV enlargement and mild RV dysfunction, moderate MR, severe AS with mean gradient 36 mmHg and AVA 0.95 cm^2, dilated IVC.    Patient seems to lack insight into her disease.  She has odd ideation during conversation.   General: NAD Neck: JVP 16 cm, no thyromegaly or thyroid nodule.  Lungs: Clear to auscultation bilaterally with normal respiratory effort. CV: Nondisplaced PMI.  Heart regular S1/S2, no S3/S4, 3/6 SEM RUSB, S2 muffled.  1+ edema to knees  No carotid bruit.  Normal pedal pulses.  Abdomen: Soft, nontender, no hepatosplenomegaly, no distention.  Skin: Intact without lesions or rashes.  Neurologic: Alert and oriented x 3.  Psych: Normal affect. Extremities: No clubbing or cyanosis.  HEENT: Normal.   1. Acute on chronic systolic CHF: Long history of CHF.  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 TnI minimally elevated this admission with no trend, suspect demand ischemia.  Echo this admission with EF 25-30%, severe LV dilation, severe concentric LVH, mild RV enlargement and mild RV dysfunction, moderate MR, severe AS with mean gradient 36 mmHg and AVA 0.95 cm^2, dilated IVC.  In past, thought to have hypertensive CMP versus hypertrophic  cardiomyopathy, though cardiac amyloidosis is a definite consideration.  Severe AS is now complicating the situation.  Management has been difficult due to schizophrenia/poor compliance. She is admitted with significant volume overload. Creatinine trending down, 1.47 today.  - Lasix 80 mg IV bid, follow response.   - Continue Coreg 6.25 mg bid.  - No Entresto for now with elevated creatinine - Agree that digoxin and spironolactone will be difficult to use with poor compliance/followup.  - I do not think she could make it through a cardiac MRI to assess for cardiac amyloidosis or HCM.  2. Aortic stenosis: Severe low flow/low gradient aortic stenosis.  She is not a surgical candidate.  I am not sure that she would be a TAVR candidate given difficulty with comprehension/compliance (schizophrenia) and PAD, though we could consider this as I think that AS has played a role in her decompensation.  3. HTN: Has been noncompliant with meds and followup.   - Agree with continuing amlodipine due to simplicity and lack of side effects.  4. 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  Creatinine trending down with diuresis, 1.73 => 1.47.  5. Schizophrenia: Lack of insight, odd thinking.  Suspect this has caused very poor compliance with medications and followup.   Marca Ancona 06/14/2023 4:16 PM

## 2023-06-14 NOTE — Progress Notes (Signed)
 Heart Failure Navigator Progress Note  Assessed for Heart & Vascular TOC clinic readiness.  Patient does not meet criteria due to Advanced Heart Failure Team consulted. .   Navigator will sign off at this time.   Rhae Hammock, BSN, Scientist, clinical (histocompatibility and immunogenetics) Only
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
## 2023-06-14 NOTE — Plan of Care (Signed)
  Problem: Clinical Measurements: Goal: Will remain free from infection Outcome: Progressing Goal: Respiratory complications will improve Outcome: Progressing Goal: Cardiovascular complication will be avoided Outcome: Progressing   Problem: Coping: Goal: Level of anxiety will decrease Outcome: Progressing   Problem: Elimination: Goal: Will not experience complications related to bowel motility Outcome: Progressing   Problem: Safety: Goal: Ability to remain free from injury will improve Outcome: Progressing   Problem: Cardiac: Goal: Ability to achieve and maintain adequate cardiopulmonary perfusion will improve Outcome: Progressing

## 2023-06-15 ENCOUNTER — Other Ambulatory Visit (HOSPITAL_COMMUNITY): Payer: Self-pay

## 2023-06-15 DIAGNOSIS — I5043 Acute on chronic combined systolic (congestive) and diastolic (congestive) heart failure: Secondary | ICD-10-CM | POA: Diagnosis not present

## 2023-06-15 LAB — BASIC METABOLIC PANEL WITH GFR
Anion gap: 8 (ref 5–15)
BUN: 27 mg/dL — ABNORMAL HIGH (ref 8–23)
CO2: 32 mmol/L (ref 22–32)
Calcium: 8.9 mg/dL (ref 8.9–10.3)
Chloride: 101 mmol/L (ref 98–111)
Creatinine, Ser: 1.46 mg/dL — ABNORMAL HIGH (ref 0.44–1.00)
GFR, Estimated: 38 mL/min — ABNORMAL LOW (ref >60)
Glucose, Bld: 91 mg/dL (ref 70–99)
Potassium: 4.1 mmol/L (ref 3.5–5.1)
Sodium: 141 mmol/L (ref 135–145)

## 2023-06-15 MED ORDER — LOSARTAN POTASSIUM 25 MG PO TABS
25.0000 mg | ORAL_TABLET | Freq: Every day | ORAL | Status: DC
  Administered 2023-06-15 – 2023-06-17 (×3): 25 mg via ORAL
  Filled 2023-06-15 (×4): qty 1

## 2023-06-15 NOTE — Plan of Care (Signed)
  Problem: Clinical Measurements: Goal: Will remain free from infection Outcome: Progressing Goal: Respiratory complications will improve Outcome: Progressing Goal: Cardiovascular complication will be avoided Outcome: Progressing   Problem: Activity: Goal: Risk for activity intolerance will decrease Outcome: Progressing   Problem: Nutrition: Goal: Adequate nutrition will be maintained Outcome: Progressing   Problem: Elimination: Goal: Will not experience complications related to bowel motility Outcome: Progressing   Problem: Safety: Goal: Ability to remain free from injury will improve Outcome: Progressing   Problem: Cardiac: Goal: Ability to achieve and maintain adequate cardiopulmonary perfusion will improve Outcome: Progressing

## 2023-06-15 NOTE — Progress Notes (Signed)
 PROGRESS NOTE    Erin Riley  ION:[id number] DOB: Mar 22, 1950 DOA: 06/12/2023 PCP: Hoy Register, MD   Brief Narrative: Erin Riley is a 73 y.o. female with a history of hypertension, hypertrophic cardiomyopathy, chronic combined diastolic and systolic heart failure, PAD, CKD stage IIIb, hyperlipidemia, schizophrenia, AAA.  Patient presented secondary to progressively worsening dyspnea and found to have evidence of acute heart failure.  Patient started on IV Lasix for management. Cardiology consulted for co-management.    Assessment/Plan:  Acute on chronic combined HFrEF Present on admission.  LVEF of 40 to 45% with grade 1 diastolic dysfunction noted on echocardiogram from July 2024.  Patient with elevated BNP of 3463 with associated dyspnea, lower extremity swelling, vascular congestion with concern for possible pulmonary edema on chest x-ray.  Patient is managed on Lasix 40 mg daily and Jardiance 10 mg daily patient started on Lasix IV for management.  Transthoracic Echocardiogram this admission significant for worsening LV function with EF of 25-30% and associated grade 2 diastolic dysfunction. Cardiology consulted. -Continue Lasix IV -Daily weights and strict ins and outs -Cardiology recommendations: medical management with Lasix IV, Coreg, Losartan. cMRI to assess for cardiac amyloidosis or HCM deferred.  Moderate MV/TV regurgitation Mild-moderate aortic valve stenosis Unlikely candidate for TAVR, per cardiology.  Demand ischemia Elevated troponin of 57 with negative delta of 61.  No evidence of acute ischemia on EKG.  In setting of acute heart failure.  Primary hypertension Patient is on amlodipine, Coreg, Imdur, hydralazine as an outpatient. -Continue amlodipine, Coreg -Continue Imdur  Anemia of chronic kidney disease Stable.  AKI on CKD stage IIIb Baseline creatinine of 1.4-1.5.  Creatinine of 1.81 on admission with acute worsening to 2.0.  Improving with IV  diuresis.  Schizophrenia Mood disorder - Continue home Zyprexa  Obesity, class II Estimated body mass index is 34.52 kg/m as calculated from the following:   Height as of this encounter: 5\' 7"  (1.702 m).   Weight as of this encounter: 100 kg.   DVT prophylaxis: Lovenox Code Status:   Code Status: Full Code Family Communication: None at bedside Disposition Plan: Discharge home with home health therapy pending improvement of heart failure symptoms and cardiology recommendations   Consultants:  Cardiology  Procedures:  Transthoracic Echocardiogram  Antimicrobials:  None   Subjective: Breathing continues to improve. No dyspnea at rest.  Objective: BP 137/79 (BP Location: Right Arm)   Pulse 66   Temp 97.8 F (36.6 C) (Oral)   Resp 20   Ht 5\' 7"  (1.702 m)   Wt 100 kg   LMP 05/10/2013   SpO2 94%   BMI 34.52 kg/m   Examination:  General exam: Appears calm and comfortable Respiratory system: Clear to auscultation. Respiratory effort normal. Cardiovascular system: S1 & S2 heard, RRR. Systolic murmur. Gastrointestinal system: Abdomen is nondistended, soft and nontender. Normal bowel sounds heard. Central nervous system: Alert and oriented. No focal neurological deficits. Musculoskeletal: Trace edema. No calf tenderness Psychiatry: Judgement and insight appear normal. Mood & affect appropriate.     Data Reviewed: I have personally reviewed following labs and imaging studies   Last CBC Lab Results  Component Value Date   WBC 6.4 06/13/2023   HGB 9.5 (L) 06/13/2023   HCT 32.1 (L) 06/13/2023   MCV 99.7 06/13/2023   MCH 29.5 06/13/2023   RDW 16.6 (H) 06/13/2023   PLT 265 06/13/2023     Last metabolic panel Lab Results  Component Value Date   GLUCOSE 91 06/15/2023  NA 141 06/15/2023   K 4.1 06/15/2023   CL 101 06/15/2023   CO2 32 06/15/2023   BUN 27 (H) 06/15/2023   CREATININE 1.46 (H) 06/15/2023   GFRNONAA 38 (L) 06/15/2023   CALCIUM 8.9 06/15/2023    PHOS 4.1 06/13/2023   PROT 6.9 06/12/2023   ALBUMIN 3.2 (L) 06/12/2023   LABGLOB 3.4 02/15/2021   AGRATIO 1.1 (L) 02/15/2021   BILITOT 1.3 (H) 06/12/2023   ALKPHOS 72 06/12/2023   AST 16 06/12/2023   ALT 13 06/12/2023   ANIONGAP 8 06/15/2023     Creatinine Clearance: Estimated Creatinine Clearance: 42.3 mL/min (A) (by C-G formula based on SCr of 1.46 mg/dL (H)).  Recent Results (from the past 240 hours)  Resp panel by RT-PCR (RSV, Flu A&B, Covid) Anterior Nasal Swab     Status: None   Collection Time: 06/12/23  7:31 PM   Specimen: Anterior Nasal Swab  Result Value Ref Range Status   SARS Coronavirus 2 by RT PCR NEGATIVE NEGATIVE Final   Influenza A by PCR NEGATIVE NEGATIVE Final   Influenza B by PCR NEGATIVE NEGATIVE Final    Comment: (NOTE) The Xpert Xpress SARS-CoV-2/FLU/RSV plus assay is intended as an aid in the diagnosis of influenza from Nasopharyngeal swab specimens and should not be used as a sole basis for treatment. Nasal washings and aspirates are unacceptable for Xpert Xpress SARS-CoV-2/FLU/RSV testing.  Fact Sheet for Patients: BloggerCourse.com  Fact Sheet for Healthcare Providers: SeriousBroker.it  This test is not yet approved or cleared by the [place] FDA and has been authorized for detection and/or diagnosis of SARS-CoV-2 by FDA under an Emergency Use Authorization (EUA). This EUA will remain in effect (meaning this test can be used) for the duration of the COVID-19 declaration under Section 564(b)(1) of the Act, 21 U.S.C. section 360bbb-3(b)(1), unless the authorization is terminated or revoked.     Resp Syncytial Virus by PCR NEGATIVE NEGATIVE Final    Comment: (NOTE) Fact Sheet for Patients: BloggerCourse.com  Fact Sheet for Healthcare Providers: SeriousBroker.it  This test is not yet approved or cleared by the [place] FDA and has  been authorized for detection and/or diagnosis of SARS-CoV-2 by FDA under an Emergency Use Authorization (EUA). This EUA will remain in effect (meaning this test can be used) for the duration of the COVID-19 declaration under Section 564(b)(1) of the Act, 21 U.S.C. section 360bbb-3(b)(1), unless the authorization is terminated or revoked.  Performed at [facility], [address], [place], [place] [address]       Radiology Studies: No results found.     LOS: 3 days    Jacquelin Hawking, MD [facility] 06/15/2023, 12:57 PM   If 7PM-7AM, please contact night-coverage [email or web address]

## 2023-06-15 NOTE — Progress Notes (Signed)
 Physical Therapy Treatment Patient Details Name: Erin Riley MRN: [id number] DOB: [birth year] Today's Date: 06/15/2023   History of Present Illness 73 y.o. female presents to [facility] hospital on 06/12/2023 with progressively worsening dyspnea, BLE edema and orthopnea. Pt admitted for management of CHF exacerbation. PMH includes HTN, hypertrophic cardiomyopathy, PAD, CKD III, HLD, schizophrenia, AAA.    PT Comments  Pt greeted asleep in bed, agreeable to a short PT treatment. Today's session focused on rollator education including proper sequencing and features. PT demonstrated use prior to initiating mobility to which pt verbalized understanding. She completed transfers and gait using rollator with supervision. She demonstrated good technique and safety awareness using AD. Will continue to follow acutely and advance appropriately.    If plan is discharge home, recommend the following: A little help with bathing/dressing/bathroom;Assistance with cooking/housework;Assist for transportation;Help with stairs or ramp for entrance;A little help with walking and/or transfers   Can travel by private vehicle        Equipment Recommendations  Rollator (4 wheels)    Recommendations for Other Services       Precautions / Restrictions Precautions Precautions: Fall Recall of Precautions/Restrictions: Intact Restrictions Weight Bearing Restrictions Per Provider Order: No     Mobility  Bed Mobility Overal bed mobility: Modified Independent             General bed mobility comments: HOB elevated.    Transfers Overall transfer level: Needs assistance Equipment used: Rollator (4 wheels) Transfers: Sit to/from Stand Sit to Stand: Supervision           General transfer comment: VC for proper hand positioning and sequencing using rollator. Pt stood from lowest bed height and commode without physical assistance to power up. Pt properly locked/unlocked rollator when taking seated rest break on  rollator seat. Good eccentric control with sitting.    Ambulation/Gait Ambulation/Gait assistance: Supervision Gait Distance (Feet): 150 Feet Assistive device: Rollator (4 wheels) Gait Pattern/deviations: Step-through pattern, Decreased stride length Gait velocity: reduced Gait velocity interpretation: <1.8 ft/sec, indicate of risk for recurrent falls   General Gait Details: Pt ambulated with a reciprocal gait pattern, even weight shift, and good foot clearence. She maintained body inside the AD and navigated obstacles well.   Stairs             Wheelchair Mobility     Tilt Bed    Modified Rankin (Stroke Patients Only)       Balance Overall balance assessment: Mild deficits observed, not formally tested                                          Communication Communication Communication: No apparent difficulties  Cognition Arousal: Alert Behavior During Therapy: WFL for tasks assessed/performed   PT - Cognitive impairments: No apparent impairments                         Following commands: Intact      Cueing Cueing Techniques: Verbal cues  Exercises      General Comments General comments (skin integrity, edema, etc.): VSS on RA.      Pertinent Vitals/Pain Pain Assessment Pain Assessment: No/denies pain    Home Living                          Prior Function  PT Goals (current goals can now be found in the care plan section) Acute Rehab PT Goals Patient Stated Goal: Go Home Progress towards PT goals: Progressing toward goals    Frequency    Min 2X/week      PT Plan      Co-evaluation              AM-PAC PT "6 Clicks" Mobility   Outcome Measure  Help needed turning from your back to your side while in a flat bed without using bedrails?: None Help needed moving from lying on your back to sitting on the side of a flat bed without using bedrails?: None Help needed moving to and from  a bed to a chair (including a wheelchair)?: A Little Help needed standing up from a chair using your arms (e.g., wheelchair or bedside chair)?: A Little Help needed to walk in hospital room?: A Little Help needed climbing 3-5 steps with a railing? : A Lot 6 Click Score: 19    End of Session Equipment Utilized During Treatment: Gait belt Activity Tolerance: Patient tolerated treatment well Patient left: in bed;with call bell/phone within reach Nurse Communication: Mobility status PT Visit Diagnosis: Other abnormalities of gait and mobility (R26.89)     Time: 1610-9604 PT Time Calculation (min) (ACUTE ONLY): 15 min  Charges:    $Gait Training: 8-22 mins PT General Charges $$ ACUTE PT VISIT: 1 Visit                     Cheri Guppy, PT, DPT Acute Rehabilitation Services Office: [phone] Secure Chat Preferred  Erin Riley 06/15/2023, 3:26 PM

## 2023-06-15 NOTE — Progress Notes (Signed)
 Advanced Heart Failure Rounding Note  Cardiologist: Bryan Lemma, MD  Chief Complaint: SOB  Subjective:    Good diuresis yesterday, 4.6L in UOP. Wt down another 6 lb.   Scr/BUN improving, SCr 1.46 today. K 4.1. BP remains elevated 150s systolic.   Feels much better today. Breathing improving but still SOB w/ exertion. LEE improved.    Objective:   Weight Range: 100 kg Body mass index is 34.52 kg/m.   Vital Signs:   Temp:  [97.5 F (36.4 C)-98.8 F (37.1 C)] 97.5 F (36.4 C) (04/11 0420) Pulse Rate:  [60-75] 63 (04/11 0421) Resp:  [18-22] 20 (04/11 0420) BP: (123-161)/(61-97) 158/97 (04/11 0420) SpO2:  [88 %-95 %] 88 % (04/11 0421) Weight:  [100 kg] 100 kg (04/11 0421) Last BM Date : 06/14/23  Weight change: Filed Weights   06/13/23 1522 06/14/23 0500 06/15/23 0421  Weight: 104.4 kg 102.6 kg 100 kg    Intake/Output:   Intake/Output Summary (Last 24 hours) at 06/15/2023 0804 Last data filed at 06/15/2023 6578 Gross per 24 hour  Intake 1431 ml  Output 4550 ml  Net -3119 ml      Physical Exam    General:  elderly female, sitting up on side of bed w/ slight conversational dyspnea. No increased WOB  HEENT: Normal Neck: Supple. JVP 10-12 cm. Carotids 2+ bilat; no bruits. No lymphadenopathy or thyromegaly appreciated. Cor: PMI nondisplaced. Regular rate & rhythm. 3/6 harsh SEM RUSB Lungs: decreased BS at the bases  Abdomen: Soft, nontender, nondistended. No hepatosplenomegaly. No bruits or masses. Good bowel sounds. Extremities: No cyanosis, clubbing, rash, edema Neuro: Alert & orientedx3, cranial nerves grossly intact. moves all 4 extremities w/o difficulty. Affect pleasant   Telemetry   NSR 70s, personally reviewed   EKG    N/A   Labs    CBC Recent Labs    06/12/23 1901 06/12/23 2025 06/12/23 2026 06/13/23 0322  WBC 7.2  --   --  6.4  NEUTROABS 4.9  --   --   --   HGB 9.3*   < > 10.5* 9.5*  HCT 31.5*   < > 31.0* 32.1*  MCV 98.7  --   --   99.7  PLT 313  --   --  265   < > = values in this interval not displayed.   Basic Metabolic Panel Recent Labs    46/96/29 1901 06/12/23 2025 06/13/23 0322 06/14/23 0324 06/15/23 0311  NA 141   < > 141 141 141  K 4.4   < > 3.8 3.6 4.1  CL 105   < > 106 104 101  CO2 25  --  26 29 32  GLUCOSE 94   < > 127* 101* 91  BUN 50*   < > 46* 34* 27*  CREATININE 1.81*   < > 1.73* 1.47* 1.46*  CALCIUM 9.0  --  8.7* 8.8* 8.9  MG 2.1  --  2.0  --   --   PHOS  --   --  4.1  --   --    < > = values in this interval not displayed.   Liver Function Tests Recent Labs    06/12/23 1901  AST 16  ALT 13  ALKPHOS 72  BILITOT 1.3*  PROT 6.9  ALBUMIN 3.2*   No results for input(s): "LIPASE", "AMYLASE" in the last 72 hours. Cardiac Enzymes No results for input(s): "CKTOTAL", "CKMB", "CKMBINDEX", "TROPONINI" in the last 72 hours.  BNP: BNP (last  3 results) Recent Labs    09/13/22 0154 05/21/23 1327 06/12/23 1852  BNP 67.2 1,469.6* 3,462.9*    ProBNP (last 3 results) No results for input(s): "PROBNP" in the last 8760 hours.   D-Dimer No results for input(s): "DDIMER" in the last 72 hours. Hemoglobin A1C No results for input(s): "HGBA1C" in the last 72 hours. Fasting Lipid Panel No results for input(s): "CHOL", "HDL", "LDLCALC", "TRIG", "CHOLHDL", "LDLDIRECT" in the last 72 hours. Thyroid Function Tests No results for input(s): "TSH", "T4TOTAL", "T3FREE", "THYROIDAB" in the last 72 hours.  Invalid input(s): "FREET3"  Other results:   Imaging    No results found.   Medications:     Scheduled Medications:  amLODipine  10 mg Oral Daily   carvedilol  6.25 mg Oral BID WC   enoxaparin (LOVENOX) injection  40 mg Subcutaneous Daily   furosemide  80 mg Intravenous BID   hydrocortisone   Rectal BID   isosorbide mononitrate  60 mg Oral Daily   OLANZapine  5 mg Oral QHS    Infusions:   PRN Medications: acetaminophen, melatonin, mouth rinse, polyethylene  glycol    Patient Profile   73 y/o AAF w/ chronic combined systolic and diastolic heart failure, severe aortic stenosis, uncontrolled HTN, CKD III, PVD, schizophrenia and poor health literacy and lack of social support impacting compliance, admitted w/ acute on chronic systolic/diastolic heart failure.   Assessment/Plan   1. Acute on chronic systolic CHF: Long history of CHF.  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 TnI minimally elevated this admission with no trend, suspect demand ischemia.  Echo this admission with EF 25-30%, severe LV dilation, severe concentric LVH, mild RV enlargement and mild RV dysfunction, moderate MR, severe AS with mean gradient 36 mmHg and AVA 0.95 cm^2, dilated IVC.  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  Severe AS is now complicating the situation.  Management has been difficult due to schizophrenia/poor compliance. She is admitted with significant volume overload. Responding well to IV Lasix but remains volume up. SCr improving.  - Continue Lasix 80 mg bid through today   - Continue Coreg 6.25 mg bid.  - Start Losartan 25 mg daily  - digoxin and spironolactone will be difficult to use with poor compliance/followup.  - I do not think she could make it through a cardiac MRI to assess for cardiac amyloidosis or HCM.   2. Aortic stenosis: Severe low flow/low gradient aortic stenosis.  She is not a surgical candidate.  I am not sure that she would be a TAVR candidate given difficulty with comprehension/compliance (schizophrenia) and PAD, though we could consider this as I think that AS has played a role in her decompensation.   3. HTN: Has been noncompliant with meds and followup. BP remains elevated but improving w/ HF GDMT. Titration per above - needs to improve compliance - not THN, may not qualify for para medicine but will consult SW  - would benefit from pill bubble  packs   4. 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 Improving w/ diuresis, 1.73>>1.46 today  - continue to follow BMP w/ diuresis   5. Schizophrenia: Lack of insight, odd thinking.  Suspect this has caused very poor compliance with medications and followup.  - per IM    Length of Stay: 3  Duquan Gillooly, PA-C  06/15/2023, 8:04 AM  Advanced Heart Failure Team Pager [phone] (M-F; 7a - 5p)  Please contact  [facility] for night-coverage after hours (5p -7a ) and weekends on [email or web address]

## 2023-06-15 NOTE — Plan of Care (Signed)
  Problem: Education: Goal: Knowledge of General Education information will improve Description: Including pain rating scale, medication(s)/side effects and non-pharmacologic comfort measures Outcome: Not Progressing   Problem: Health Behavior/Discharge Planning: Goal: Ability to manage health-related needs will improve Outcome: Not Progressing   Problem: Clinical Measurements: Goal: Ability to maintain clinical measurements within normal limits will improve Outcome: Not Progressing Goal: Will remain free from infection Outcome: Not Progressing Goal: Diagnostic test results will improve Outcome: Not Progressing Goal: Respiratory complications will improve Outcome: Not Progressing Goal: Cardiovascular complication will be avoided Outcome: Not Progressing   Problem: Activity: Goal: Risk for activity intolerance will decrease Outcome: Not Progressing   Problem: Nutrition: Goal: Adequate nutrition will be maintained Outcome: Not Progressing   Problem: Coping: Goal: Level of anxiety will decrease Outcome: Not Progressing   Problem: Elimination: Goal: Will not experience complications related to bowel motility Outcome: Not Progressing Goal: Will not experience complications related to urinary retention Outcome: Not Progressing   Problem: Pain Managment: Goal: General experience of comfort will improve and/or be controlled Outcome: Not Progressing   Problem: Safety: Goal: Ability to remain free from injury will improve Outcome: Not Progressing   Problem: Skin Integrity: Goal: Risk for impaired skin integrity will decrease Outcome: Not Progressing   Problem: Education: Goal: Ability to demonstrate management of disease process will improve Outcome: Not Progressing Goal: Ability to verbalize understanding of medication therapies will improve Outcome: Not Progressing Goal: Individualized Educational Video(s) Outcome: Not Progressing   Problem: Activity: Goal:  Capacity to carry out activities will improve Outcome: Not Progressing   Problem: Cardiac: Goal: Ability to achieve and maintain adequate cardiopulmonary perfusion will improve Outcome: Not Progressing

## 2023-06-15 NOTE — Care Management Important Message (Signed)
 Important Message  Patient Details  Name: Erin Riley MRN: [id number] Date of Birth: 1951/01/18   Important Message Given:  Yes - Medicare IM     Dorena Bodo 06/15/2023, 3:36 PM

## 2023-06-15 NOTE — Plan of Care (Signed)

## 2023-06-16 DIAGNOSIS — I5023 Acute on chronic systolic (congestive) heart failure: Secondary | ICD-10-CM | POA: Diagnosis not present

## 2023-06-16 DIAGNOSIS — I5043 Acute on chronic combined systolic (congestive) and diastolic (congestive) heart failure: Secondary | ICD-10-CM | POA: Diagnosis not present

## 2023-06-16 DIAGNOSIS — I350 Nonrheumatic aortic (valve) stenosis: Secondary | ICD-10-CM | POA: Diagnosis not present

## 2023-06-16 LAB — BASIC METABOLIC PANEL WITH GFR
Anion gap: 11 (ref 5–15)
BUN: 26 mg/dL — ABNORMAL HIGH (ref 8–23)
CO2: 31 mmol/L (ref 22–32)
Calcium: 9.2 mg/dL (ref 8.9–10.3)
Chloride: 97 mmol/L — ABNORMAL LOW (ref 98–111)
Creatinine, Ser: 1.53 mg/dL — ABNORMAL HIGH (ref 0.44–1.00)
GFR, Estimated: 36 mL/min — ABNORMAL LOW (ref >60)
Glucose, Bld: 90 mg/dL (ref 70–99)
Potassium: 3.9 mmol/L (ref 3.5–5.1)
Sodium: 139 mmol/L (ref 135–145)

## 2023-06-16 MED ORDER — FUROSEMIDE 40 MG PO TABS
40.0000 mg | ORAL_TABLET | Freq: Every day | ORAL | Status: DC
  Administered 2023-06-17 – 2023-06-18 (×2): 40 mg via ORAL
  Filled 2023-06-16 (×2): qty 1

## 2023-06-16 MED ORDER — PNEUMOCOCCAL 20-VAL CONJ VACC 0.5 ML IM SUSY
0.5000 mL | PREFILLED_SYRINGE | INTRAMUSCULAR | Status: AC
  Administered 2023-06-19: 0.5 mL via INTRAMUSCULAR
  Filled 2023-06-16 (×2): qty 0.5

## 2023-06-16 NOTE — Plan of Care (Signed)
  Problem: Education: Goal: Knowledge of General Education information will improve Description: Including pain rating scale, medication(s)/side effects and non-pharmacologic comfort measures Outcome: Not Progressing   Problem: Health Behavior/Discharge Planning: Goal: Ability to manage health-related needs will improve Outcome: Not Progressing   Problem: Clinical Measurements: Goal: Ability to maintain clinical measurements within normal limits will improve Outcome: Not Progressing Goal: Will remain free from infection Outcome: Not Progressing Goal: Diagnostic test results will improve Outcome: Not Progressing Goal: Respiratory complications will improve Outcome: Not Progressing Goal: Cardiovascular complication will be avoided Outcome: Not Progressing   Problem: Activity: Goal: Risk for activity intolerance will decrease Outcome: Not Progressing   Problem: Nutrition: Goal: Adequate nutrition will be maintained Outcome: Not Progressing   Problem: Coping: Goal: Level of anxiety will decrease Outcome: Not Progressing   Problem: Elimination: Goal: Will not experience complications related to bowel motility Outcome: Not Progressing Goal: Will not experience complications related to urinary retention Outcome: Not Progressing   Problem: Pain Managment: Goal: General experience of comfort will improve and/or be controlled Outcome: Not Progressing   Problem: Safety: Goal: Ability to remain free from injury will improve Outcome: Not Progressing   Problem: Skin Integrity: Goal: Risk for impaired skin integrity will decrease Outcome: Not Progressing   Problem: Education: Goal: Ability to demonstrate management of disease process will improve Outcome: Not Progressing Goal: Ability to verbalize understanding of medication therapies will improve Outcome: Not Progressing Goal: Individualized Educational Video(s) Outcome: Not Progressing   Problem: Activity: Goal:  Capacity to carry out activities will improve Outcome: Not Progressing   Problem: Cardiac: Goal: Ability to achieve and maintain adequate cardiopulmonary perfusion will improve Outcome: Not Progressing

## 2023-06-16 NOTE — Progress Notes (Signed)
 PROGRESS NOTE    Erin Riley  ZOX:[id number] DOB: 26-Dec-1950 DOA: 06/12/2023 PCP: Joaquin Mulberry, MD   Brief Narrative: Erin Riley is a 73 y.o. female with a history of hypertension, hypertrophic cardiomyopathy, chronic combined diastolic and systolic heart failure, PAD, CKD stage IIIb, hyperlipidemia, schizophrenia, AAA.  Patient presented secondary to progressively worsening dyspnea and found to have evidence of acute heart failure.  Patient started on IV Lasix for management. Cardiology consulted for co-management.    Assessment/Plan:  Acute on chronic combined HFrEF Present on admission.  LVEF of 40 to 45% with grade 1 diastolic dysfunction noted on echocardiogram from July 2024.  Patient with elevated BNP of 3463 with associated dyspnea, lower extremity swelling, vascular congestion with concern for possible pulmonary edema on chest x-ray.  Patient is managed on Lasix 40 mg daily and Jardiance 10 mg daily patient started on Lasix IV for management.  Transthoracic Echocardiogram this admission significant for worsening LV function with EF of 25-30% and associated grade 2 diastolic dysfunction. Cardiology consulted. Initial weight of 104.4 kg. Weight down to 98.1 kg. -Daily weights and strict ins and outs -Cardiology recommendations: medical management with Lasix IV, Coreg, Losartan. cMRI to assess for cardiac amyloidosis or HCM deferred.  Moderate MV/TV regurgitation Mild-moderate aortic valve stenosis Unlikely candidate for TAVR, per cardiology.  Demand ischemia Elevated troponin of 57 with negative delta of 61.  No evidence of acute ischemia on EKG.  In setting of acute heart failure.  Primary hypertension Patient is on amlodipine, Coreg, Imdur, hydralazine as an outpatient. -Continue amlodipine, Coreg -Continue Imdur  Anemia of chronic kidney disease Stable.  AKI on CKD stage IIIb Baseline creatinine of 1.4-1.5.  Creatinine of 1.81 on admission with acute worsening to 2.0.   Improving with IV diuresis.  Schizophrenia Mood disorder -Continue home Zyprexa  Obesity, class II Estimated body mass index is 33.88 kg/m as calculated from the following:   Height as of this encounter: 5\' 7"  (1.702 m).   Weight as of this encounter: 98.1 kg.   DVT prophylaxis: Lovenox Code Status:   Code Status: Full Code Family Communication: None at bedside Disposition Plan: Discharge home with home health therapy pending improvement of heart failure symptoms and cardiology recommendations   Consultants:  Cardiology  Procedures:  Transthoracic Echocardiogram  Antimicrobials:  None   Subjective: Feels well. No dyspnea.  Objective: BP 114/75 (BP Location: Right Arm)   Pulse 77   Temp 97.8 F (36.6 C) (Oral)   Resp 18   Ht 5\' 7"  (1.702 m)   Wt 98.1 kg   LMP 05/10/2013   SpO2 93%   BMI 33.88 kg/m   Examination:  General exam: Appears calm and comfortable Respiratory system: Clear to auscultation. Respiratory effort normal. Cardiovascular system: S1 & S2 heard, RRR. Gastrointestinal system: Abdomen is nondistended, soft and nontender. Normal bowel sounds heard. Central nervous system: Alert and oriented. No focal neurological deficits. Musculoskeletal: Trace edema. No calf tenderness    Data Reviewed: I have personally reviewed following labs and imaging studies   Last CBC Lab Results  Component Value Date   WBC 6.4 06/13/2023   HGB 9.5 (L) 06/13/2023   HCT 32.1 (L) 06/13/2023   MCV 99.7 06/13/2023   MCH 29.5 06/13/2023   RDW 16.6 (H) 06/13/2023   PLT 265 06/13/2023     Last metabolic panel Lab Results  Component Value Date   GLUCOSE 90 06/16/2023   NA 139 06/16/2023   K 3.9 06/16/2023   CL  97 (L) 06/16/2023   CO2 31 06/16/2023   BUN 26 (H) 06/16/2023   CREATININE 1.53 (H) 06/16/2023   GFRNONAA 36 (L) 06/16/2023   CALCIUM 9.2 06/16/2023   PHOS 4.1 06/13/2023   PROT 6.9 06/12/2023   ALBUMIN 3.2 (L) 06/12/2023   LABGLOB 3.4  02/15/2021   AGRATIO 1.1 (L) 02/15/2021   BILITOT 1.3 (H) 06/12/2023   ALKPHOS 72 06/12/2023   AST 16 06/12/2023   ALT 13 06/12/2023   ANIONGAP 11 06/16/2023     Creatinine Clearance: Estimated Creatinine Clearance: 40 mL/min (A) (by C-G formula based on SCr of 1.53 mg/dL (H)).  Recent Results (from the past 240 hours)  Resp panel by RT-PCR (RSV, Flu A&B, Covid) Anterior Nasal Swab     Status: None   Collection Time: 06/12/23  7:31 PM   Specimen: Anterior Nasal Swab  Result Value Ref Range Status   SARS Coronavirus 2 by RT PCR NEGATIVE NEGATIVE Final   Influenza A by PCR NEGATIVE NEGATIVE Final   Influenza B by PCR NEGATIVE NEGATIVE Final    Comment: (NOTE) The Xpert Xpress SARS-CoV-2/FLU/RSV plus assay is intended as an aid in the diagnosis of influenza from Nasopharyngeal swab specimens and should not be used as a sole basis for treatment. Nasal washings and aspirates are unacceptable for Xpert Xpress SARS-CoV-2/FLU/RSV testing.  Fact Sheet for Patients: BloggerCourse.com  Fact Sheet for Healthcare Providers: SeriousBroker.it  This test is not yet approved or cleared by the [place]  FDA and has been authorized for detection and/or diagnosis of SARS-CoV-2 by FDA under an Emergency Use Authorization (EUA). This EUA will remain in effect (meaning this test can be used) for the duration of the COVID-19 declaration under Section 564(b)(1) of the Act, 21 U.S.C. section 360bbb-3(b)(1), unless the authorization is terminated or revoked.     Resp Syncytial Virus by PCR NEGATIVE NEGATIVE Final    Comment: (NOTE) Fact Sheet for Patients: BloggerCourse.com  Fact Sheet for Healthcare Providers: SeriousBroker.it  This test is not yet approved or cleared by the [place]  FDA and has been authorized for detection and/or diagnosis of SARS-CoV-2 by FDA under an Emergency Use  Authorization (EUA). This EUA will remain in effect (meaning this test can be used) for the duration of the COVID-19 declaration under Section 564(b)(1) of the Act, 21 U.S.C. section 360bbb-3(b)(1), unless the authorization is terminated or revoked.  Performed at [facility], [address], [place], [place] [address]       Radiology Studies: No results found.     LOS: 4 days    Aneita Keens, MD [facility] 06/16/2023, 4:08 PM   If 7PM-7AM, please contact night-coverage [email or web address]

## 2023-06-16 NOTE — Progress Notes (Addendum)
 Rounding Note    Patient Name: Erin Riley Date of Encounter: 06/16/2023  [facility] Cardiologist: Randene Bustard, MD    Subjective   73 year old female with acute on chronic systolic congestive heart failure.  She has nonobstructive coronary artery disease.  Echo during this admission shows LVEF of 25 to 30% with severe LV dilatation, severe concentric LVH, mild RV enlargement and mild RV dysfunction.  She has moderate mitral regurgitation, severe aortic stenosis with a mean aortic valve gradient of 36 mmHg.  The past she has been thought to have a hypertensive cardiomyopathy versus hypertrophic cardiomyopathy although cardiac amyloidosis is still a possibility.  She appears to have low-flow low gradient aortic stenosis.  She is not a surgical candidate.  She is diuresed 8.4 L so far during this hospitalization.   Her weight today is 98.1 kg.  She is feeling better       Inpatient Medications    Scheduled Meds:  carvedilol  6.25 mg Oral BID WC   enoxaparin (LOVENOX) injection  40 mg Subcutaneous Daily   furosemide  80 mg Intravenous BID   hydrocortisone   Rectal BID   isosorbide mononitrate  60 mg Oral Daily   losartan  25 mg Oral Daily   OLANZapine  5 mg Oral QHS   [START ON 06/17/2023] pneumococcal 20-valent conjugate vaccine  0.5 mL Intramuscular Tomorrow-1000   Continuous Infusions:  PRN Meds: acetaminophen, melatonin, mouth rinse, polyethylene glycol   Vital Signs    Vitals:   06/16/23 0044 06/16/23 0445 06/16/23 0803 06/16/23 1010  BP: 109/87 (!) 152/135 (!) 138/95 (!) 116/51  Pulse: 77 63 80 64  Resp: 18  20 18   Temp: 97.7 F (36.5 C) 97.7 F (36.5 C) 97.8 F (36.6 C) 97.8 F (36.6 C)  TempSrc: Oral Oral Oral Oral  SpO2: 99% 93% 96% 98%  Weight:  98.1 kg    Height:        Intake/Output Summary (Last 24 hours) at 06/16/2023 1320 Last data filed at 06/16/2023 1253 Gross per 24 hour  Intake 1371 ml  Output 3650 ml  Net -2279 ml       06/16/2023    4:45 AM 06/15/2023    4:21 AM 06/14/2023    5:00 AM  Last 3 Weights  Weight (lbs) 216 lb 4.8 oz 220 lb 6.4 oz 226 lb 3.1 oz  Weight (kg) 98.113 kg 99.973 kg 102.6 kg      Telemetry    Sinus rhythm  - Personally Reviewed  ECG     - Personally Reviewed  Physical Exam   GEN: No acute distress.   Neck: No JVD Cardiac: RRR,  soft systolic murmur  Respiratory: Clear to auscultation bilaterally. GI: Soft, nontender, non-distended  MS: No edema; No deformity. Neuro:  Nonfocal  Psych: Normal affect   Labs    High Sensitivity Troponin:   Recent Labs  Lab 06/12/23 1851 06/12/23 2050  TROPONINIHS 57* 61*     Chemistry Recent Labs  Lab 06/12/23 1901 06/12/23 2025 06/13/23 0322 06/14/23 0324 06/15/23 0311 06/16/23 0340  NA 141   < > 141 141 141 139  K 4.4   < > 3.8 3.6 4.1 3.9  CL 105   < > 106 104 101 97*  CO2 25  --  26 29 32 31  GLUCOSE 94   < > 127* 101* 91 90  BUN 50*   < > 46* 34* 27* 26*  CREATININE 1.81*   < >  1.73* 1.47* 1.46* 1.53*  CALCIUM 9.0  --  8.7* 8.8* 8.9 9.2  MG 2.1  --  2.0  --   --   --   PROT 6.9  --   --   --   --   --   ALBUMIN 3.2*  --   --   --   --   --   AST 16  --   --   --   --   --   ALT 13  --   --   --   --   --   ALKPHOS 72  --   --   --   --   --   BILITOT 1.3*  --   --   --   --   --   GFRNONAA 29*  --  31* 38* 38* 36*  ANIONGAP 11  --  9 8 8 11    < > = values in this interval not displayed.    Lipids No results for input(s): "CHOL", "TRIG", "HDL", "LABVLDL", "LDLCALC", "CHOLHDL" in the last 168 hours.  Hematology Recent Labs  Lab 06/12/23 1901 06/12/23 2025 06/12/23 2026 06/13/23 0322  WBC 7.2  --   --  6.4  RBC 3.19*  --   --  3.22*  HGB 9.3* 10.9* 10.5* 9.5*  HCT 31.5* 32.0* 31.0* 32.1*  MCV 98.7  --   --  99.7  MCH 29.2  --   --  29.5  MCHC 29.5*  --   --  29.6*  RDW 16.6*  --   --  16.6*  PLT 313  --   --  265   Thyroid No results for input(s): "TSH", "FREET4" in the last 168 hours.  BNP Recent  Labs  Lab 06/12/23 1852  BNP 3,462.9*    DDimer No results for input(s): "DDIMER" in the last 168 hours.   Radiology    No results found.  Cardiac Studies     Patient Profile     73 y.o. female    Assessment & Plan     Acute on chronic systolic congestive heart failure: She seems to be making progress.  Has diuresed quite well.  Her LVEF is 25 to 30%.  Continue Lasix, change to PO tomorrow     2.  HTN:  BP is well controlled.    3.  Aortic stenosis:  appears to have low flow/ low gradient severe AS Will continue to diurese.  Its difficult to know if she would be a TAVR candidate.    For questions or updates, please contact [facility] Please consult [email or web address] for contact info under        Signed, Ahmad Alert, MD  06/16/2023, 1:20 PM

## 2023-06-16 NOTE — Plan of Care (Signed)

## 2023-06-17 DIAGNOSIS — I5043 Acute on chronic combined systolic (congestive) and diastolic (congestive) heart failure: Secondary | ICD-10-CM | POA: Diagnosis not present

## 2023-06-17 DIAGNOSIS — I350 Nonrheumatic aortic (valve) stenosis: Secondary | ICD-10-CM | POA: Diagnosis not present

## 2023-06-17 LAB — BASIC METABOLIC PANEL WITH GFR
Anion gap: 10 (ref 5–15)
BUN: 32 mg/dL — ABNORMAL HIGH (ref 8–23)
CO2: 30 mmol/L (ref 22–32)
Calcium: 9.3 mg/dL (ref 8.9–10.3)
Chloride: 96 mmol/L — ABNORMAL LOW (ref 98–111)
Creatinine, Ser: 1.78 mg/dL — ABNORMAL HIGH (ref 0.44–1.00)
GFR, Estimated: 30 mL/min — ABNORMAL LOW (ref >60)
Glucose, Bld: 109 mg/dL — ABNORMAL HIGH (ref 70–99)
Potassium: 4.4 mmol/L (ref 3.5–5.1)
Sodium: 136 mmol/L (ref 135–145)

## 2023-06-17 NOTE — Progress Notes (Signed)
 Notified Dr. Alroy Aspen that BP was 86/72 with MAP of 78 at 1124 and 94/75 with Map of 82 at 1203. Order received to discontinue Imdur. MD states to hold evening dose of Coreg if systolic BP below 100.

## 2023-06-17 NOTE — Progress Notes (Addendum)
 PROGRESS NOTE    AUDYN Riley  ZOX:[id number] DOB: 10/27/1950 DOA: 06/12/2023 PCP: Joaquin Mulberry, MD   Brief Narrative: Erin Riley is a 73 y.o. female with a history of hypertension, hypertrophic cardiomyopathy, chronic combined diastolic and systolic heart failure, PAD, CKD stage IIIb, hyperlipidemia, schizophrenia, AAA.  Patient presented secondary to progressively worsening dyspnea and found to have evidence of acute heart failure.  Patient started on IV Lasix for management. Cardiology consulted for co-management.    Assessment/Plan:  Acute on chronic combined HFrEF Present on admission.  LVEF of 40 to 45% with grade 1 diastolic dysfunction noted on echocardiogram from July 2024.  Patient with elevated BNP of 3463 with associated dyspnea, lower extremity swelling, vascular congestion with concern for possible pulmonary edema on chest x-ray.  Patient is managed on Lasix 40 mg daily and Jardiance 10 mg daily patient started on Lasix IV for management.  Transthoracic Echocardiogram this admission significant for worsening LV function with EF of 25-30% and associated grade 2 diastolic dysfunction. Cardiology consulted. Initial weight of 104.4 kg. Weight down to 98.1 kg. -Daily weights and strict ins and outs -Cardiology recommendations: medical management with Lasix PO, Coreg, Losartan. cMRI to assess for cardiac amyloidosis or HCM deferred.  Moderate MV/TV regurgitation Mild-moderate aortic valve stenosis Unlikely candidate for TAVR, per cardiology.  Demand ischemia Elevated troponin of 57 with negative delta of 61.  No evidence of acute ischemia on EKG.  In setting of acute heart failure.  Primary hypertension Patient is on amlodipine, Coreg, Imdur, hydralazine as an outpatient. -Continue amlodipine, Coreg -Continue Imdur  Anemia of chronic kidney disease Stable.  AKI on CKD stage IIIb Baseline creatinine of 1.4-1.5.  Creatinine of 1.81 on admission with acute worsening to 2.0.   Improved initially with IV diuresis but is now worsened. Lasix IV discontinued. -Repeat BMP in AM  Schizophrenia Mood disorder -Continue home Zyprexa  Obesity, class II Estimated body mass index is 33.25 kg/m as calculated from the following:   Height as of this encounter: 5\' 7"  (1.702 m).   Weight as of this encounter: 96.3 kg.   DVT prophylaxis: Lovenox Code Status:   Code Status: Full Code Family Communication: None at bedside Disposition Plan: Discharge home with home health therapy pending cardiology recommendations/management   Consultants:  Cardiology  Procedures:  Transthoracic Echocardiogram  Antimicrobials:  None   Subjective: No dyspnea or chest pain.  Objective: BP (!) 129/98 (BP Location: Right Arm)   Pulse 79   Temp 97.9 F (36.6 C) (Oral)   Resp 18   Ht 5\' 7"  (1.702 m)   Wt 96.3 kg   LMP 05/10/2013   SpO2 95%   BMI 33.25 kg/m   Examination:  General exam: Appears calm and comfortable Respiratory system: Clear to auscultation. Respiratory effort normal. Cardiovascular system: S1 & S2 heard, RRR. Systolic murmur Gastrointestinal system: Abdomen is nondistended, soft and nontender. Normal bowel sounds heard. Central nervous system: Alert and oriented. No focal neurological deficits. Musculoskeletal: No edema. No calf tenderness   Data Reviewed: I have personally reviewed following labs and imaging studies   Last CBC Lab Results  Component Value Date   WBC 6.4 06/13/2023   HGB 9.5 (L) 06/13/2023   HCT 32.1 (L) 06/13/2023   MCV 99.7 06/13/2023   MCH 29.5 06/13/2023   RDW 16.6 (H) 06/13/2023   PLT 265 06/13/2023     Last metabolic panel Lab Results  Component Value Date   GLUCOSE 109 (H) 06/17/2023   NA  136 06/17/2023   K 4.4 06/17/2023   CL 96 (L) 06/17/2023   CO2 30 06/17/2023   BUN 32 (H) 06/17/2023   CREATININE 1.78 (H) 06/17/2023   GFRNONAA 30 (L) 06/17/2023   CALCIUM 9.3 06/17/2023   PHOS 4.1 06/13/2023   PROT 6.9  06/12/2023   ALBUMIN 3.2 (L) 06/12/2023   LABGLOB 3.4 02/15/2021   AGRATIO 1.1 (L) 02/15/2021   BILITOT 1.3 (H) 06/12/2023   ALKPHOS 72 06/12/2023   AST 16 06/12/2023   ALT 13 06/12/2023   ANIONGAP 10 06/17/2023     Creatinine Clearance: Estimated Creatinine Clearance: 34.1 mL/min (A) (by C-G formula based on SCr of 1.78 mg/dL (H)).  Recent Results (from the past 240 hours)  Resp panel by RT-PCR (RSV, Flu A&B, Covid) Anterior Nasal Swab     Status: None   Collection Time: 06/12/23  7:31 PM   Specimen: Anterior Nasal Swab  Result Value Ref Range Status   SARS Coronavirus 2 by RT PCR NEGATIVE NEGATIVE Final   Influenza A by PCR NEGATIVE NEGATIVE Final   Influenza B by PCR NEGATIVE NEGATIVE Final    Comment: (NOTE) The Xpert Xpress SARS-CoV-2/FLU/RSV plus assay is intended as an aid in the diagnosis of influenza from Nasopharyngeal swab specimens and should not be used as a sole basis for treatment. Nasal washings and aspirates are unacceptable for Xpert Xpress SARS-CoV-2/FLU/RSV testing.  Fact Sheet for Patients: BloggerCourse.com  Fact Sheet for Healthcare Providers: SeriousBroker.it  This test is not yet approved or cleared by the [place]  FDA and has been authorized for detection and/or diagnosis of SARS-CoV-2 by FDA under an Emergency Use Authorization (EUA). This EUA will remain in effect (meaning this test can be used) for the duration of the COVID-19 declaration under Section 564(b)(1) of the Act, 21 U.S.C. section 360bbb-3(b)(1), unless the authorization is terminated or revoked.     Resp Syncytial Virus by PCR NEGATIVE NEGATIVE Final    Comment: (NOTE) Fact Sheet for Patients: BloggerCourse.com  Fact Sheet for Healthcare Providers: SeriousBroker.it  This test is not yet approved or cleared by the [place]  FDA and has been authorized for detection  and/or diagnosis of SARS-CoV-2 by FDA under an Emergency Use Authorization (EUA). This EUA will remain in effect (meaning this test can be used) for the duration of the COVID-19 declaration under Section 564(b)(1) of the Act, 21 U.S.C. section 360bbb-3(b)(1), unless the authorization is terminated or revoked.  Performed at [facility], [address], [place], [place] [address]       Radiology Studies: No results found.     LOS: 5 days    Aneita Keens, MD [facility] 06/17/2023, 8:49 AM   If 7PM-7AM, please contact night-coverage [email or web address]

## 2023-06-17 NOTE — Plan of Care (Signed)
  Problem: Education: Goal: Knowledge of General Education information will improve Description: Including pain rating scale, medication(s)/side effects and non-pharmacologic comfort measures Outcome: Not Progressing   Problem: Health Behavior/Discharge Planning: Goal: Ability to manage health-related needs will improve Outcome: Not Progressing   Problem: Clinical Measurements: Goal: Ability to maintain clinical measurements within normal limits will improve Outcome: Not Progressing Goal: Will remain free from infection Outcome: Not Progressing Goal: Diagnostic test results will improve Outcome: Not Progressing Goal: Respiratory complications will improve Outcome: Not Progressing Goal: Cardiovascular complication will be avoided Outcome: Not Progressing   Problem: Activity: Goal: Risk for activity intolerance will decrease Outcome: Not Progressing   Problem: Nutrition: Goal: Adequate nutrition will be maintained Outcome: Not Progressing   Problem: Coping: Goal: Level of anxiety will decrease Outcome: Not Progressing   Problem: Elimination: Goal: Will not experience complications related to bowel motility Outcome: Not Progressing Goal: Will not experience complications related to urinary retention Outcome: Not Progressing   Problem: Pain Managment: Goal: General experience of comfort will improve and/or be controlled Outcome: Not Progressing   Problem: Safety: Goal: Ability to remain free from injury will improve Outcome: Not Progressing   Problem: Skin Integrity: Goal: Risk for impaired skin integrity will decrease Outcome: Not Progressing   Problem: Education: Goal: Ability to demonstrate management of disease process will improve Outcome: Not Progressing Goal: Ability to verbalize understanding of medication therapies will improve Outcome: Not Progressing Goal: Individualized Educational Video(s) Outcome: Not Progressing   Problem: Activity: Goal:  Capacity to carry out activities will improve Outcome: Not Progressing   Problem: Cardiac: Goal: Ability to achieve and maintain adequate cardiopulmonary perfusion will improve Outcome: Not Progressing

## 2023-06-17 NOTE — Plan of Care (Signed)
   Problem: Education: Goal: Knowledge of General Education information will improve Description Including pain rating scale, medication(s)/side effects and non-pharmacologic comfort measures Outcome: Progressing   Problem: Health Behavior/Discharge Planning: Goal: Ability to manage health-related needs will improve Outcome: Progressing

## 2023-06-17 NOTE — Progress Notes (Signed)
 Rounding Note    Patient Name: Erin Riley Date of Encounter: 06/17/2023  [facility] Cardiologist: Randene Bustard, MD    Subjective   73 year old female with acute on chronic systolic congestive heart failure.  She has nonobstructive coronary artery disease.  Echo during this admission shows LVEF of 25 to 30% with severe LV dilatation, severe concentric LVH, mild RV enlargement and mild RV dysfunction.  She has moderate mitral regurgitation, severe aortic stenosis with a mean aortic valve gradient of 36 mmHg.  The past she has been thought to have a hypertensive cardiomyopathy versus hypertrophic cardiomyopathy although cardiac amyloidosis is still a possibility.  She appears to have low-flow low gradient aortic stenosis.  She is not a surgical candidate.  She has diuresed 9 L so far during this hospitalization.  Her weight today is 96.3 kg.    She is feeling better   BP is a bit low.  Will DC imdur  Will hold Coreg tonight if her BP remains low       Inpatient Medications    Scheduled Meds:  carvedilol  6.25 mg Oral BID WC   enoxaparin (LOVENOX) injection  40 mg Subcutaneous Daily   furosemide  40 mg Oral Daily   hydrocortisone   Rectal BID   isosorbide mononitrate  60 mg Oral Daily   losartan  25 mg Oral Daily   OLANZapine  5 mg Oral QHS   pneumococcal 20-valent conjugate vaccine  0.5 mL Intramuscular Tomorrow-1000   Continuous Infusions:  PRN Meds: acetaminophen, melatonin, mouth rinse, polyethylene glycol   Vital Signs    Vitals:   06/17/23 0002 06/17/23 0003 06/17/23 0446 06/17/23 0752  BP: (!) 142/124  134/88 (!) 129/98  Pulse: 77 68 78 79  Resp: 18  18 18   Temp: 99.6 F (37.6 C)  (!) 97.2 F (36.2 C) 97.9 F (36.6 C)  TempSrc: Oral  Oral Oral  SpO2: 96% 100% 100% 95%  Weight:   96.3 kg   Height:        Intake/Output Summary (Last 24 hours) at 06/17/2023 1057 Last data filed at 06/17/2023 0913 Gross per 24 hour  Intake 837 ml   Output 1300 ml  Net -463 ml      06/17/2023    4:46 AM 06/16/2023    4:45 AM 06/15/2023    4:21 AM  Last 3 Weights  Weight (lbs) 212 lb 4.9 oz 216 lb 4.8 oz 220 lb 6.4 oz  Weight (kg) 96.3 kg 98.113 kg 99.973 kg      Telemetry    Sinus rhythm   - Personally Reviewed  ECG     - Personally Reviewed  Physical Exam   Physical Exam: Blood pressure (!) 129/98, pulse 79, temperature 97.9 F (36.6 C), temperature source Oral, resp. rate 18, height 5\' 7"  (1.702 m), weight 96.3 kg, last menstrual period 05/10/2013, SpO2 95%.       GEN:  Well nourished, well developed in no acute distress HEENT: Normal NECK: No JVD; No carotid bruits LYMPHATICS: No lymphadenopathy CARDIAC: RRR , + systolic murmur  RESPIRATORY:  Clear to auscultation without rales, wheezing or rhonchi  ABDOMEN: Soft, non-tender, non-distended MUSCULOSKELETAL:  No edema; No deformity  SKIN: Warm and dry NEUROLOGIC:  Alert and oriented x 3  Labs    High Sensitivity Troponin:   Recent Labs  Lab 06/12/23 1851 06/12/23 2050  TROPONINIHS 57* 61*     Chemistry Recent Labs  Lab 06/12/23 1901 06/12/23 2025 06/13/23 0322  06/14/23 0324 06/15/23 0311 06/16/23 0340 06/17/23 0254  NA 141   < > 141   < > 141 139 136  K 4.4   < > 3.8   < > 4.1 3.9 4.4  CL 105   < > 106   < > 101 97* 96*  CO2 25  --  26   < > 32 31 30  GLUCOSE 94   < > 127*   < > 91 90 109*  BUN 50*   < > 46*   < > 27* 26* 32*  CREATININE 1.81*   < > 1.73*   < > 1.46* 1.53* 1.78*  CALCIUM 9.0  --  8.7*   < > 8.9 9.2 9.3  MG 2.1  --  2.0  --   --   --   --   PROT 6.9  --   --   --   --   --   --   ALBUMIN 3.2*  --   --   --   --   --   --   AST 16  --   --   --   --   --   --   ALT 13  --   --   --   --   --   --   ALKPHOS 72  --   --   --   --   --   --   BILITOT 1.3*  --   --   --   --   --   --   GFRNONAA 29*  --  31*   < > 38* 36* 30*  ANIONGAP 11  --  9   < > 8 11 10    < > = values in this interval not displayed.    Lipids No  results for input(s): "CHOL", "TRIG", "HDL", "LABVLDL", "LDLCALC", "CHOLHDL" in the last 168 hours.  Hematology Recent Labs  Lab 06/12/23 1901 06/12/23 2025 06/12/23 2026 06/13/23 0322  WBC 7.2  --   --  6.4  RBC 3.19*  --   --  3.22*  HGB 9.3* 10.9* 10.5* 9.5*  HCT 31.5* 32.0* 31.0* 32.1*  MCV 98.7  --   --  99.7  MCH 29.2  --   --  29.5  MCHC 29.5*  --   --  29.6*  RDW 16.6*  --   --  16.6*  PLT 313  --   --  265   Thyroid No results for input(s): "TSH", "FREET4" in the last 168 hours.  BNP Recent Labs  Lab 06/12/23 1852  BNP 3,462.9*    DDimer No results for input(s): "DDIMER" in the last 168 hours.   Radiology    No results found.  Cardiac Studies     Patient Profile     73 y.o. female    Assessment & Plan     Acute on chronic systolic congestive heart failure: She seems to be making progress.  Has diuresed quite well.  Her LVEF is 25 to 30%.  She is now on PO lasix   BP is low this afternoon Will DC imdur    2.  HTN: BP is low,  hold IMdur .  Continue to follow      3.  Aortic stenosis:  appears to have low flow/ low gradient severe AS She is not a good surgical candidate.  May not be a candidate for TAVR either .     For  questions or updates, please contact [facility] Please consult [email or web address] for contact info under        Signed, Ahmad Alert, MD  06/17/2023, 10:57 AM

## 2023-06-18 DIAGNOSIS — I10 Essential (primary) hypertension: Secondary | ICD-10-CM | POA: Diagnosis not present

## 2023-06-18 DIAGNOSIS — I5043 Acute on chronic combined systolic (congestive) and diastolic (congestive) heart failure: Secondary | ICD-10-CM | POA: Diagnosis not present

## 2023-06-18 DIAGNOSIS — I350 Nonrheumatic aortic (valve) stenosis: Secondary | ICD-10-CM | POA: Diagnosis not present

## 2023-06-18 LAB — BASIC METABOLIC PANEL WITH GFR
Anion gap: 10 (ref 5–15)
BUN: 38 mg/dL — ABNORMAL HIGH (ref 8–23)
CO2: 29 mmol/L (ref 22–32)
Calcium: 9.2 mg/dL (ref 8.9–10.3)
Chloride: 98 mmol/L (ref 98–111)
Creatinine, Ser: 1.79 mg/dL — ABNORMAL HIGH (ref 0.44–1.00)
GFR, Estimated: 30 mL/min — ABNORMAL LOW (ref >60)
Glucose, Bld: 133 mg/dL — ABNORMAL HIGH (ref 70–99)
Potassium: 3.6 mmol/L (ref 3.5–5.1)
Sodium: 137 mmol/L (ref 135–145)

## 2023-06-18 MED ORDER — ISOSORBIDE MONONITRATE ER 30 MG PO TB24: 30.0000 mg | ORAL_TABLET | Freq: Every day | ORAL | Status: DC

## 2023-06-18 MED ORDER — ISOSORBIDE MONONITRATE ER 60 MG PO TB24
60.0000 mg | ORAL_TABLET | Freq: Every day | ORAL | Status: DC
  Filled 2023-06-18: qty 1

## 2023-06-18 MED ORDER — POTASSIUM CHLORIDE CRYS ER 20 MEQ PO TBCR
40.0000 meq | EXTENDED_RELEASE_TABLET | Freq: Once | ORAL | Status: AC
  Administered 2023-06-18: 40 meq via ORAL
  Filled 2023-06-18: qty 2

## 2023-06-18 MED ORDER — CARVEDILOL 3.125 MG PO TABS
3.1250 mg | ORAL_TABLET | Freq: Two times a day (BID) | ORAL | Status: DC
Start: 2023-06-18 — End: 2023-06-19
  Administered 2023-06-18 – 2023-06-19 (×3): 3.125 mg via ORAL
  Filled 2023-06-18 (×3): qty 1

## 2023-06-18 MED ORDER — CARVEDILOL 3.125 MG PO TABS: 3.1250 mg | ORAL_TABLET | Freq: Two times a day (BID) | ORAL | Status: DC

## 2023-06-18 MED ORDER — LOSARTAN POTASSIUM 25 MG PO TABS: 25.0000 mg | ORAL_TABLET | Freq: Every day | ORAL | Status: DC

## 2023-06-18 NOTE — Progress Notes (Signed)
 PROGRESS NOTE    Erin Riley  ZOX:[id number] DOB: 09/28/1950 DOA: 06/12/2023 PCP: Hoy Register, MD   Brief Narrative: Erin Riley is a 73 y.o. female with a history of hypertension, hypertrophic cardiomyopathy, chronic combined diastolic and systolic heart failure, PAD, CKD stage IIIb, hyperlipidemia, schizophrenia, AAA.  Patient presented secondary to progressively worsening dyspnea and found to have evidence of acute heart failure.  Patient started on IV Lasix for management. Cardiology consulted for co-management.    Assessment/Plan:  Acute on chronic combined HFrEF Present on admission.  LVEF of 40 to 45% with grade 1 diastolic dysfunction noted on echocardiogram from July 2024.  Patient with elevated BNP of 3463 with associated dyspnea, lower extremity swelling, vascular congestion with concern for possible pulmonary edema on chest x-ray.  Patient is managed on Lasix 40 mg daily and Jardiance 10 mg daily patient started on Lasix IV for management.  Transthoracic Echocardiogram this admission significant for worsening LV function with EF of 25-30% and associated grade 2 diastolic dysfunction. Cardiology consulted. Initial weight of 104.4 kg. Weight down to 96.8 kg. -Daily weights and strict ins and outs -Cardiology recommendations: medical management with Lasix PO, Coreg, Losartan. cMRI to assess for cardiac amyloidosis or HCM deferred.  Moderate MV/TV regurgitation Mild-moderate aortic valve stenosis Unlikely candidate for TAVR, per cardiology.  Demand ischemia Elevated troponin of 57 with negative delta of 61.  No evidence of acute ischemia on EKG.  In setting of acute heart failure.  Primary hypertension Patient is on amlodipine, Coreg, Imdur, hydralazine as an outpatient. Amlodipine and Imdur held secondary to low-normal blood pressure. -Continue Coreg and losartan  Anemia of chronic kidney disease Stable.  AKI on CKD stage IIIb Baseline creatinine of 1.4-1.5.  Creatinine  of 1.81 on admission with acute worsening to 2.0.  Improved initially with IV diuresis but is now worsened again; creatinine stable at 1.79. Lasix IV discontinued. -Repeat BMP in AM  Schizophrenia Mood disorder -Continue home Zyprexa  Obesity, class II Estimated body mass index is 33.44 kg/m as calculated from the following:   Height as of this encounter: 5\' 7"  (1.702 m).   Weight as of this encounter: 96.8 kg.   DVT prophylaxis: Lovenox Code Status:   Code Status: Full Code Family Communication: None at bedside Disposition Plan: Discharge home with home health therapy pending cardiology recommendations/management   Consultants:  Cardiology  Procedures:  Transthoracic Echocardiogram  Antimicrobials:  None   Subjective: No concerns today. Feels well.  Objective: BP (!) 117/91 (BP Location: Right Arm)   Pulse 81   Temp 97.7 F (36.5 C) (Oral)   Resp 20   Ht 5\' 7"  (1.702 m)   Wt 96.8 kg   LMP 05/10/2013   SpO2 96%   BMI 33.44 kg/m   Examination:  General exam: Appears calm and comfortable Respiratory system: Clear to auscultation. Respiratory effort normal. Cardiovascular system: S1 & S2 heard, RRR. Gastrointestinal system: Abdomen is nondistended, soft and nontender. Normal bowel sounds heard. Central nervous system: Alert and oriented. No focal neurological deficits. Musculoskeletal: No edema. No calf tenderness Psychiatry: Judgement and insight appear normal. Mood & affect appropriate.    Data Reviewed: I have personally reviewed following labs and imaging studies   Last CBC Lab Results  Component Value Date   WBC 6.4 06/13/2023   HGB 9.5 (L) 06/13/2023   HCT 32.1 (L) 06/13/2023   MCV 99.7 06/13/2023   MCH 29.5 06/13/2023   RDW 16.6 (H) 06/13/2023   PLT 265 06/13/2023  Last metabolic panel Lab Results  Component Value Date   GLUCOSE 133 (H) 06/18/2023   NA 137 06/18/2023   K 3.6 06/18/2023   CL 98 06/18/2023   CO2 29 06/18/2023   BUN  38 (H) 06/18/2023   CREATININE 1.79 (H) 06/18/2023   GFRNONAA 30 (L) 06/18/2023   CALCIUM 9.2 06/18/2023   PHOS 4.1 06/13/2023   PROT 6.9 06/12/2023   ALBUMIN 3.2 (L) 06/12/2023   LABGLOB 3.4 02/15/2021   AGRATIO 1.1 (L) 02/15/2021   BILITOT 1.3 (H) 06/12/2023   ALKPHOS 72 06/12/2023   AST 16 06/12/2023   ALT 13 06/12/2023   ANIONGAP 10 06/18/2023     Creatinine Clearance: Estimated Creatinine Clearance: 33.9 mL/min (A) (by C-G formula based on SCr of 1.79 mg/dL (H)).  Recent Results (from the past 240 hours)  Resp panel by RT-PCR (RSV, Flu A&B, Covid) Anterior Nasal Swab     Status: None   Collection Time: 06/12/23  7:31 PM   Specimen: Anterior Nasal Swab  Result Value Ref Range Status   SARS Coronavirus 2 by RT PCR NEGATIVE NEGATIVE Final   Influenza A by PCR NEGATIVE NEGATIVE Final   Influenza B by PCR NEGATIVE NEGATIVE Final    Comment: (NOTE) The Xpert Xpress SARS-CoV-2/FLU/RSV plus assay is intended as an aid in the diagnosis of influenza from Nasopharyngeal swab specimens and should not be used as a sole basis for treatment. Nasal washings and aspirates are unacceptable for Xpert Xpress SARS-CoV-2/FLU/RSV testing.  Fact Sheet for Patients: BloggerCourse.com  Fact Sheet for Healthcare Providers: SeriousBroker.it  This test is not yet approved or cleared by the [place]  FDA and has been authorized for detection and/or diagnosis of SARS-CoV-2 by FDA under an Emergency Use Authorization (EUA). This EUA will remain in effect (meaning this test can be used) for the duration of the COVID-19 declaration under Section 564(b)(1) of the Act, 21 U.S.C. section 360bbb-3(b)(1), unless the authorization is terminated or revoked.     Resp Syncytial Virus by PCR NEGATIVE NEGATIVE Final    Comment: (NOTE) Fact Sheet for Patients: BloggerCourse.com  Fact Sheet for Healthcare  Providers: SeriousBroker.it  This test is not yet approved or cleared by the [place]  FDA and has been authorized for detection and/or diagnosis of SARS-CoV-2 by FDA under an Emergency Use Authorization (EUA). This EUA will remain in effect (meaning this test can be used) for the duration of the COVID-19 declaration under Section 564(b)(1) of the Act, 21 U.S.C. section 360bbb-3(b)(1), unless the authorization is terminated or revoked.  Performed at [facility], [address] [place], [place] [address]       Radiology Studies: No results found.     LOS: 6 days    Aneita Keens, MD [facility] 06/18/2023, 4:42 PM   If 7PM-7AM, please contact night-coverage [email or web address]

## 2023-06-18 NOTE — Care Management Important Message (Signed)
 Important Message  Patient Details  Name: Erin Riley MRN: [id number] Date of Birth: 09-20-1950   Important Message Given:  Yes - Medicare IM     Janith Melnick 06/18/2023, 10:31 AM

## 2023-06-18 NOTE — Progress Notes (Addendum)
 Advanced Heart Failure Rounding Note  Cardiologist: Bryan Lemma, MD  Chief Complaint: SOB  Subjective:    Imdur held for soft BP.  Lasix stopped yesterday. Overall down 17 lbs.  sCr rising 1.73>1.46>1.79.  Lying in bed. Breathing is much better. No CP, palpitations. Ate breakfast this morning.   Objective:    Weight Range: 96.8 kg Body mass index is 33.44 kg/m.   Vital Signs:   Temp:  [97.5 F (36.4 C)-99.1 F (37.3 C)] 97.5 F (36.4 C) (04/14 0719) Pulse Rate:  [69-87] 69 (04/14 0719) Resp:  [16-20] 20 (04/14 0719) BP: (86-130)/(70-98) 105/71 (04/14 0719) SpO2:  [94 %-99 %] 95 % (04/14 0719) Weight:  [96.8 kg] 96.8 kg (04/14 0529) Last BM Date : 06/17/23  Weight change: Filed Weights   06/16/23 0445 06/17/23 0446 06/18/23 0529  Weight: 98.1 kg 96.3 kg 96.8 kg   Intake/Output:  Intake/Output Summary (Last 24 hours) at 06/18/2023 0739 Last data filed at 06/18/2023 0529 Gross per 24 hour  Intake 717 ml  Output 1475 ml  Net -758 ml    Physical Exam    General: Well appearing. No distress on RA Cardiac: JVP ~6cm. S1 and S2 present. 4/6 systolic RUSB murmur Abdomen: Soft, non-tender, non-distended.  Extremities: Warm and dry.  No peripheral edema.  Neuro: Alert and oriented x3. Affect pleasant. Moves all extremities without difficulty.  Telemetry   SR in 70-80s (personally reviewed)  EKG    N/A   Labs    CBC No results for input(s): "WBC", "NEUTROABS", "HGB", "HCT", "MCV", "PLT" in the last 72 hours.  Basic Metabolic Panel Recent Labs    11/91/47 0254 06/18/23 0305  NA 136 137  K 4.4 3.6  CL 96* 98  CO2 30 29  GLUCOSE 109* 133*  BUN 32* 38*  CREATININE 1.78* 1.79*  CALCIUM 9.3 9.2   BNP (last 3 results) Recent Labs    09/13/22 0154 05/21/23 1327 06/12/23 1852  BNP 67.2 1,469.6* 3,462.9*   Imaging   No results found.  Medications:    Scheduled Medications:  carvedilol  6.25 mg Oral BID WC   enoxaparin (LOVENOX) injection   40 mg Subcutaneous Daily   furosemide  40 mg Oral Daily   hydrocortisone   Rectal BID   losartan  25 mg Oral Daily   OLANZapine  5 mg Oral QHS   pneumococcal 20-valent conjugate vaccine  0.5 mL Intramuscular Tomorrow-1000   Infusions:  PRN Medications: acetaminophen, melatonin, mouth rinse, polyethylene glycol  Patient Profile   73 y/o AAF w/ chronic combined systolic and diastolic heart failure, severe aortic stenosis, uncontrolled HTN, CKD III, PVD, schizophrenia and poor health literacy and lack of social support impacting compliance, admitted w/ acute on chronic systolic/diastolic heart failure.   Assessment/Plan   1. Acute on chronic systolic CHF: Long history of CHF.  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 TnI minimally elevated this admission with no trend, suspect demand ischemia.  Echo this admission with EF 25-30%, severe LV dilation, severe concentric LVH, mild RV enlargement and mild RV dysfunction, moderate MR, severe AS with mean gradient 36 mmHg and AVA 0.95 cm^2, dilated IVC.  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  Severe AS is now complicating the situation.  Management has been difficult due to schizophrenia/poor compliance. She is admitted with significant volume overload.   - Continue Lasix 40 mg daily - Decrease Coreg to 3.125 mg bid.  -  Continue Losartan 25 mg daily, move to bedtime - digoxin and spironolactone will be difficult to use with poor compliance/followup.  - I do not think she could make it through a cardiac MRI to assess for cardiac amyloidosis or HCM.   2. Aortic stenosis: Severe low flow/low gradient aortic stenosis.  She is not a surgical candidate.  I am not sure that she would be a TAVR candidate given difficulty with comprehension/compliance (schizophrenia) and PAD, though we could consider this as I think that AS has played a role in her decompensation.    3. HTN: Has been noncompliant with meds and followup. BP intermittently soft. Med changes as above. - needs to improve compliance - not THN, may not qualify for paramedicine but will consult SW  - would benefit from pill bubble packs   4. 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 sCr initially improved to 1.46> now back up to 1.76. Follow with de-escalation of IV diuresis.   5. Schizophrenia: Lack of insight, odd thinking.  Suspect this has caused very poor compliance with medications and followup.  - per IM   Length of Stay: 6  [place] Keshaun Dubey, NP  06/18/2023, 7:39 AM  Advanced Heart Failure Team Pager [phone] (M-F; 7a - 5p)  Please contact [facility] for night-coverage after hours (5p -7a ) and weekends on [email or web address]

## 2023-06-18 NOTE — Progress Notes (Signed)
 Occupational Therapy Treatment Patient Details Name: Erin Riley MRN: [id number] DOB: 1950/10/19 Today's Date: 06/18/2023   History of present illness 73 y.o. female presents to [facility] hospital on 06/12/2023 with progressively worsening dyspnea, BLE edema and orthopnea. Pt admitted for management of CHF exacerbation. PMH includes HTN, hypertrophic cardiomyopathy, PAD, CKD III, HLD, schizophrenia, AAA.   OT comments  Pt supine upon arrival and agreeable to therapy. Pt denied completing ADLs but completed LB dressing while EOB to change socks with supervision. Pt practiced ambulating with rollator to retrieve items around the room to bring back to sink, successfully retrieved 3/3 items safety and demonstrated fair ability to navigate obstacles while walking. Pt engaged in dynamic standing activities to promote balance and tolerance, no LOB noted throughout duration of tasks however pt could not maintain tolerance for greater than 2 minutes. Short walk completed in hall, pt required one rest break and educated on rollator safety/support. D/c recommendations remain appropriate, acute OT to continue to follow to address established goals to facilitate DC to next venue of care.        If plan is discharge home, recommend the following:  Direct supervision/assist for medications management;Assist for transportation   Equipment Recommendations  None recommended by OT    Recommendations for Other Services      Precautions / Restrictions Precautions Precautions: Fall Recall of Precautions/Restrictions: Intact Restrictions Weight Bearing Restrictions Per Provider Order: No       Mobility Bed Mobility Overal bed mobility: Modified Independent             General bed mobility comments: Seated EOB upon arrival    Transfers Overall transfer level: Needs assistance Equipment used: Rollator (4 wheels) Transfers: Sit to/from Stand Sit to Stand: Supervision           General transfer  comment: pt able to stand from sitting on bed without physical assistance and using rollator, good recall of using rollator and breaks, educated to place against wall when requiring sit break for extra support     Balance Overall balance assessment: Mild deficits observed, not formally tested Sitting-balance support: No upper extremity supported, Feet supported Sitting balance-Leahy Scale: Good     Standing balance support: No upper extremity supported, Bilateral upper extremity supported, During functional activity Standing balance-Leahy Scale: Fair                             ADL either performed or assessed with clinical judgement   ADL Overall ADL's : Modified independent                                       General ADL Comments: pt ambualted around room with rollator this date, no LOB noted and able to move fairly well. Pt declined completed any ADLs but did participate in LB dressing EOB to don socks. Pt engaged in dynamic standing tasks by tossing object back and forth with therapist, able to maintain balance well and worked on Environmental education officer with rollator which she did well. Standing tolerance low and only able to stand for no greater than 2 min at a time before requiring rest    Extremity/Trunk Assessment              Vision       Perception     Praxis     Communication Communication Communication: No  apparent difficulties   Cognition Arousal: Alert Behavior During Therapy: WFL for tasks assessed/performed Cognition: History of cognitive impairments             OT - Cognition Comments: pt giggled/laughed often throughout entirety of session                 Following commands: Intact        Cueing   Cueing Techniques: Verbal cues  Exercises      Shoulder Instructions       General Comments VSS on RA    Pertinent Vitals/ Pain       Pain Assessment Pain Assessment: No/denies pain  Home Living                                           Prior Functioning/Environment              Frequency  Min 1X/week        Progress Toward Goals  OT Goals(current goals can now be found in the care plan section)  Progress towards OT goals: Progressing toward goals  Acute Rehab OT Goals Patient Stated Goal: none stated OT Goal Formulation: With patient Time For Goal Achievement: 06/28/23 Potential to Achieve Goals: Good ADL Goals Additional ADL Goal #1: Pt to verbalize at least 3 strategies to implement in daily routine to prevent CHF exacerbation Additional ADL Goal #2: Pt to complete pill box test with 0 errors Additional ADL Goal #3: Pt to demo ability to gather ADL/IADL items Independently without LOB or safety concerns Additional ADL Goal #4: Pt to increase standing activity tolerance > 15 min during ADLs/IADLs/mobility without seated rest break  Plan      Co-evaluation                 AM-PAC OT "6 Clicks" Daily Activity     Outcome Measure   Help from another person eating meals?: None Help from another person taking care of personal grooming?: None Help from another person toileting, which includes using toliet, bedpan, or urinal?: None Help from another person bathing (including washing, rinsing, drying)?: None Help from another person to put on and taking off regular upper body clothing?: None Help from another person to put on and taking off regular lower body clothing?: None 6 Click Score: 24    End of Session Equipment Utilized During Treatment: Gait belt;Rollator (4 wheels)  OT Visit Diagnosis: Other symptoms and signs involving cognitive function   Activity Tolerance Patient tolerated treatment well   Patient Left in bed;with call bell/phone within reach   Nurse Communication Mobility status        Time: 1610-9604 OT Time Calculation (min): 32 min  Charges: OT General Charges $OT Visit: 1 Visit OT Treatments $Self Care/Home Management :  8-22 mins $Therapeutic Activity: 8-22 mins  Adriene Padula, BS, OTA/S   Keisi Eckford 06/18/2023, 12:52 PM

## 2023-06-18 NOTE — Progress Notes (Signed)
 Mobility Specialist Progress Note:   06/18/23 1115  Mobility  Activity Ambulated with assistance in room  Level of Assistance Standby assist, set-up cues, supervision of patient - no hands on  Assistive Device None;Four wheel walker  Distance Ambulated (ft) 50 ft  Activity Response Tolerated well  Mobility Referral Yes  Mobility visit 1 Mobility  Mobility Specialist Start Time (ACUTE ONLY) 1115  Mobility Specialist Stop Time (ACUTE ONLY) 1126  Mobility Specialist Time Calculation (min) (ACUTE ONLY) 11 min   Pt agreeable to mobility session. Recent OT session where pt ambulated down hall, so opted for short room distance. Required no physical assistance to ambulate in room with no AD and rollator. Pt left sitting EOB with all needs met.   Erin Riley Mobility Specialist Please contact via SecureChat or  Rehab office at [phone]

## 2023-06-18 NOTE — TOC Transition Note (Signed)
 Transition of Care [facility]) - Discharge Note   Patient Details  Name: Erin Riley MRN: [id number] Date of Birth: October 03, 1950  [facility]) CM/SW Contact:  Ernst Heap Phone Number: [phone] 06/18/2023, 11:19 AM   Clinical Narrative:  HF CSW called and spoke with the patient over the phone. Patient stated that her son will provide transportation home at dc.   HF CSW called and scheduled patients hospital follow up appointment for Wednesday, July 04, 2023 at 9:10 AM.     Final next level of care: Home w Home Health Services Barriers to Discharge: Continued Medical Work up   Patient Goals and CMS Choice Patient states their goals for this hospitalization and ongoing recovery are:: get better CMS Medicare.gov Compare Post Acute Care list provided to:: Patient Choice offered to / list presented to : Patient      Discharge Placement                       Discharge Plan and Services Additional resources added to the After Visit Summary for   In-house Referral: NA Discharge Planning Services: CM Consult Post Acute Care Choice: Durable Medical Equipment, Home Health          DME Arranged: Walker rolling with seat DME Agency: [employer] Date DME Agency Contacted: 06/14/23 Time DME Agency Contacted: 1204 Representative spoke with at DME Agency: Zula Hitch HH Arranged: PT          Social Drivers of Health (SDOH) Interventions SDOH Screenings   Food Insecurity: No Food Insecurity (06/13/2023)  Housing: High Risk (06/13/2023)  Transportation Needs: No Transportation Needs (06/13/2023)  Utilities: Not At Risk (06/13/2023)  Alcohol Screen: Low Risk  (06/06/2022)  Depression (PHQ2-9): Low Risk  (01/26/2021)  Social Connections: Moderately Isolated (06/13/2023)  Tobacco Use: High Risk (06/12/2023)     Readmission Risk Interventions    06/14/2023   11:56 AM 09/14/2022   10:20 AM  Readmission Risk Prevention Plan  Transportation Screening Complete  Complete  PCP or Specialist Appt within 3-5 Days Complete Complete  HRI or Home Care Consult Complete Complete  Social Work Consult for Recovery Care Planning/Counseling  Complete  Palliative Care Screening Not Applicable Not Applicable  Medication Review Oceanographer) Complete Referral to Pharmacy

## 2023-06-19 ENCOUNTER — Other Ambulatory Visit (HOSPITAL_COMMUNITY): Payer: Self-pay

## 2023-06-19 DIAGNOSIS — N179 Acute kidney failure, unspecified: Secondary | ICD-10-CM | POA: Diagnosis not present

## 2023-06-19 DIAGNOSIS — N1832 Chronic kidney disease, stage 3b: Secondary | ICD-10-CM | POA: Insufficient documentation

## 2023-06-19 DIAGNOSIS — E66811 Obesity, class 1: Secondary | ICD-10-CM | POA: Insufficient documentation

## 2023-06-19 DIAGNOSIS — I350 Nonrheumatic aortic (valve) stenosis: Secondary | ICD-10-CM | POA: Diagnosis not present

## 2023-06-19 DIAGNOSIS — I5043 Acute on chronic combined systolic (congestive) and diastolic (congestive) heart failure: Secondary | ICD-10-CM | POA: Diagnosis not present

## 2023-06-19 LAB — BASIC METABOLIC PANEL WITH GFR
Anion gap: 8 (ref 5–15)
BUN: 29 mg/dL — ABNORMAL HIGH (ref 8–23)
CO2: 28 mmol/L (ref 22–32)
Calcium: 9.5 mg/dL (ref 8.9–10.3)
Chloride: 101 mmol/L (ref 98–111)
Creatinine, Ser: 1.42 mg/dL — ABNORMAL HIGH (ref 0.44–1.00)
GFR, Estimated: 39 mL/min — ABNORMAL LOW (ref >60)
Glucose, Bld: 115 mg/dL — ABNORMAL HIGH (ref 70–99)
Potassium: 4.2 mmol/L (ref 3.5–5.1)
Sodium: 137 mmol/L (ref 135–145)

## 2023-06-19 LAB — MAGNESIUM: Magnesium: 2.2 mg/dL (ref 1.7–2.4)

## 2023-06-19 MED ORDER — CARVEDILOL 3.125 MG PO TABS
3.1250 mg | ORAL_TABLET | Freq: Two times a day (BID) | ORAL | 0 refills | Status: DC
  Filled 2023-06-19: qty 180, 90d supply, fill #0

## 2023-06-19 MED ORDER — FUROSEMIDE 20 MG PO TABS: 60.0000 mg | ORAL_TABLET | Freq: Every day | ORAL | Status: DC

## 2023-06-19 MED ORDER — FUROSEMIDE 40 MG PO TABS
40.0000 mg | ORAL_TABLET | Freq: Every day | ORAL | 1 refills | Status: DC
  Filled 2023-06-19: qty 30, 30d supply, fill #0

## 2023-06-19 MED ORDER — POTASSIUM CHLORIDE CRYS ER 20 MEQ PO TBCR: 40.0000 meq | EXTENDED_RELEASE_TABLET | Freq: Once | ORAL | Status: DC

## 2023-06-19 MED ORDER — POTASSIUM CHLORIDE CRYS ER 10 MEQ PO TBCR
10.0000 meq | EXTENDED_RELEASE_TABLET | Freq: Every day | ORAL | 3 refills | Status: DC
Start: 2023-06-19 — End: 2023-08-02
  Filled 2023-06-19: qty 30, 30d supply, fill #0

## 2023-06-19 MED ORDER — ATORVASTATIN CALCIUM 40 MG PO TABS
40.0000 mg | ORAL_TABLET | Freq: Every day | ORAL | 1 refills | Status: DC
Start: 2023-06-19 — End: 2023-08-02
  Filled 2023-06-19: qty 90, 90d supply, fill #0

## 2023-06-19 MED ORDER — FUROSEMIDE 40 MG PO TABS
40.0000 mg | ORAL_TABLET | Freq: Every day | ORAL | Status: DC
  Administered 2023-06-19: 40 mg via ORAL
  Filled 2023-06-19: qty 1

## 2023-06-19 MED ORDER — EMPAGLIFLOZIN 10 MG PO TABS
10.0000 mg | ORAL_TABLET | Freq: Every day | ORAL | 1 refills | Status: DC
  Filled 2023-06-19: qty 90, 90d supply, fill #0

## 2023-06-19 MED ORDER — LOSARTAN POTASSIUM 25 MG PO TABS
25.0000 mg | ORAL_TABLET | Freq: Every day | ORAL | 0 refills | Status: DC
  Filled 2023-06-19: qty 90, 90d supply, fill #0

## 2023-06-19 MED ORDER — LOSARTAN POTASSIUM 25 MG PO TABS
25.0000 mg | ORAL_TABLET | Freq: Every day | ORAL | Status: DC
  Administered 2023-06-19: 25 mg via ORAL
  Filled 2023-06-19: qty 1

## 2023-06-19 NOTE — Progress Notes (Signed)
 Physical Therapy Treatment Patient Details Name: Erin Riley MRN: [id number] DOB: [birth year] Today's Date: 06/19/2023   History of Present Illness 73 y.o. female presents to [facility] hospital on 06/12/2023 with progressively worsening dyspnea, BLE edema and orthopnea. Pt admitted for management of CHF exacerbation. PMH includes HTN, hypertrophic cardiomyopathy, PAD, CKD III, HLD, schizophrenia, AAA.    PT Comments  Pt greeted supine in bed, pleasant and agreeable to PT session. She ambulated ~450ft using rollator. Pt demonstrated proper sequencing and good safety awareness with mobility. Educated pt on HEP for BLE strengthening. She completed two exercises with multi-modal cueing for proper technique. Her lunch tray arrived and pt requested to end session, to enjoy her meal while warm. Pt will benefit from HHPT to increase strength, improve balance, and advance activity tolerance.     If plan is discharge home, recommend the following: Assist for transportation;Help with stairs or ramp for entrance;A little help with walking and/or transfers;Assistance with cooking/housework   Can travel by Pension scheme manager (4 wheels)    Recommendations for Other Services       Precautions / Restrictions Precautions Precautions: Fall Recall of Precautions/Restrictions: Intact Restrictions Weight Bearing Restrictions Per Provider Order: No     Mobility  Bed Mobility Overal bed mobility: Independent             General bed mobility comments: Pt sat up from a flat bed without use of bedrails or increased time. She returned to seated EOB to eat her lunch.    Transfers Overall transfer level: Modified independent Equipment used: Rollator (4 wheels) Transfers: Sit to/from Stand             General transfer comment: Pt demonstrated proper sequencing of rollator breaks. She powered up from lowest bed height and rollator seat. Good eccentric control with  sitting.    Ambulation/Gait Ambulation/Gait assistance: Supervision Gait Distance (Feet): 400 Feet Assistive device: Rollator (4 wheels) Gait Pattern/deviations: Step-through pattern, Decreased stride length Gait velocity: WFL Gait velocity interpretation: 1.31 - 2.62 ft/sec, indicative of limited community ambulator   General Gait Details: Pt ambulated with a reciprocal gait pattern, even weight shift, and good foot clearence. She had difficulty advancing LLE and had increased IR when stepping. Pt displayed good safety awareness navigating in room/hallway, determining if she needed a seated rest, and proper technique for locking/unlocking rollator breaks.   Stairs             Wheelchair Mobility     Tilt Bed    Modified Rankin (Stroke Patients Only)       Balance Overall balance assessment: Mild deficits observed, not formally tested                                          Communication Communication Communication: No apparent difficulties  Cognition Arousal: Alert Behavior During Therapy: WFL for tasks assessed/performed   PT - Cognitive impairments: No apparent impairments                         Following commands: Intact      Cueing Cueing Techniques: Verbal cues, Tactile cues, Gestural cues  Exercises General Exercises - Lower Extremity Hip Flexion/Marching: Standing, Both, 10 reps, Strengthening Mini-Sqauts: Standing, Both, 10 reps, Strengthening    General Comments General comments (skin integrity, edema, etc.):  VSS on RA. Pt requested to end session once her lunch tray arrived.      Pertinent Vitals/Pain Pain Assessment Pain Assessment: No/denies pain    Home Living                          Prior Function            PT Goals (current goals can now be found in the care plan section) Acute Rehab PT Goals Patient Stated Goal: Go Home Progress towards PT goals: Progressing toward goals     Frequency    Min 2X/week      PT Plan      Co-evaluation              AM-PAC PT "6 Clicks" Mobility   Outcome Measure  Help needed turning from your back to your side while in a flat bed without using bedrails?: None Help needed moving from lying on your back to sitting on the side of a flat bed without using bedrails?: None Help needed moving to and from a bed to a chair (including a wheelchair)?: A Little Help needed standing up from a chair using your arms (e.g., wheelchair or bedside chair)?: None Help needed to walk in hospital room?: A Little Help needed climbing 3-5 steps with a railing? : A Little 6 Click Score: 21    End of Session Equipment Utilized During Treatment: Gait belt Activity Tolerance: Patient tolerated treatment well Patient left: in bed;with call bell/phone within reach Nurse Communication: Mobility status PT Visit Diagnosis: Other abnormalities of gait and mobility (R26.89)     Time: 1610-9604 PT Time Calculation (min) (ACUTE ONLY): 17 min  Charges:    $Gait Training: 8-22 mins PT General Charges $$ ACUTE PT VISIT: 1 Visit                     Glenford Lanes, PT, DPT Acute Rehabilitation Services Office: [phone] Secure Chat Preferred  Riva Chester 06/19/2023, 12:26 PM

## 2023-06-19 NOTE — Progress Notes (Signed)
 Advanced Heart Failure Rounding Note  Cardiologist: Bryan Lemma, MD  Chief Complaint: SOB  Subjective:    BP elevated overnight. Will add holding parameters to medication administration.  sCr pending this morning.   Sitting up in bed. Feeling well. No SOB, CP, or palpations. Has been ambulating.   Objective:    Weight Range: 97.5 kg Body mass index is 33.67 kg/m.   Vital Signs:   Temp:  [97.7 F (36.5 C)-99.2 F (37.3 C)] 98.1 F (36.7 C) (04/15 0419) Pulse Rate:  [69-85] 84 (04/15 0419) Resp:  [19-20] 19 (04/15 0419) BP: (89-155)/(68-114) 155/86 (04/15 0419) SpO2:  [91 %-97 %] 95 % (04/15 0419) Weight:  [97.5 kg] 97.5 kg (04/15 0419) Last BM Date : 06/18/23  Weight change: Filed Weights   06/17/23 0446 06/18/23 0529 06/19/23 0419  Weight: 96.3 kg 96.8 kg 97.5 kg   Intake/Output:  Intake/Output Summary (Last 24 hours) at 06/19/2023 0721 Last data filed at 06/19/2023 0426 Gross per 24 hour  Intake 600 ml  Output 1226 ml  Net -626 ml    Physical Exam    General: Well appearing. No distress on RA Cardiac: JVP flat. S1 and S2 present. 4/6 RUSB systolic murmue Extremities: Warm and dry.  No edema.  Neuro: Alert and oriented x3. Affect pleasant. Moves all extremities without difficulty.  Telemetry   SR in 70s (personally reviewed)  EKG    N/A   Labs    CBC No results for input(s): "WBC", "NEUTROABS", "HGB", "HCT", "MCV", "PLT" in the last 72 hours.  Basic Metabolic Panel Recent Labs    16/10/96 0254 06/18/23 0305  NA 136 137  K 4.4 3.6  CL 96* 98  CO2 30 29  GLUCOSE 109* 133*  BUN 32* 38*  CREATININE 1.78* 1.79*  CALCIUM 9.3 9.2   BNP (last 3 results) Recent Labs    09/13/22 0154 05/21/23 1327 06/12/23 1852  BNP 67.2 1,469.6* 3,462.9*   Imaging   No results found.  Medications:    Scheduled Medications:  carvedilol  3.125 mg Oral BID WC   enoxaparin (LOVENOX) injection  40 mg Subcutaneous Daily   furosemide  40 mg Oral  Daily   hydrocortisone   Rectal BID   losartan  25 mg Oral QHS   OLANZapine  5 mg Oral QHS   pneumococcal 20-valent conjugate vaccine  0.5 mL Intramuscular Tomorrow-1000   Infusions:  PRN Medications: acetaminophen, melatonin, mouth rinse, polyethylene glycol  Patient Profile   73 y/o AAF w/ chronic combined systolic and diastolic heart failure, severe aortic stenosis, uncontrolled HTN, CKD III, PVD, schizophrenia and poor health literacy and lack of social support impacting compliance, admitted w/ acute on chronic systolic/diastolic heart failure.   Assessment/Plan   1. Acute on chronic systolic CHF: Long history of CHF.  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 TnI minimally elevated this admission with no trend, suspect demand ischemia.  Echo this admission with EF 25-30%, severe LV dilation, severe concentric LVH, mild RV enlargement and mild RV dysfunction, moderate MR, severe AS with mean gradient 36 mmHg and AVA 0.95 cm^2, dilated IVC.  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  Severe AS is now complicating the situation.  Management has been difficult due to schizophrenia/poor compliance. She is admitted with significant volume overload.   - Continue Lasix 40 mg daily - Continue Coreg to 3.125 mg bid.  - Continue Losartan 25 mg daily - Digoxin  and spironolactone will be difficult to use with poor compliance/followup.  - I do not think she could make it through a cardiac MRI to assess for cardiac amyloidosis or HCM.   2. Aortic stenosis: Severe low flow/low gradient aortic stenosis.  She is not a surgical candidate.  I am not sure that she would be a TAVR candidate given difficulty with comprehension/compliance (schizophrenia) and PAD, though we could consider this as I think that AS has played a role in her decompensation.   3. HTN: Has been noncompliant with meds and followup. BP  intermittently soft. Med changes as above. - needs to improve compliance - not THN, may not qualify for paramedicine but will consult SW  - would benefit from pill bubble packs   4. 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 sCr initially improved to 1.46> now back up to 1.76. Follow with de-escalation of IV diuresis.   5. Schizophrenia: Lack of insight, odd thinking.  Suspect this has caused very poor compliance with medications and followup.  - per IM   Heart failure team will sign off as of 06/19/23  HF Team Medication Recommendations for Home: Coreg 3.125 mg bid Lasix 40 mg daily Losartan 25 mg daily KCL 20 mEq daily  Follow up as an outpatient: [facility] 07/05/23 at 2:00pm  Length of Stay: 7  Erin Areanna Gengler, NP  06/19/2023, 7:21 AM  Advanced Heart Failure Team Pager [phone] (M-F; 7a - 5p)  Please contact [facility] for night-coverage after hours (5p -7a ) and weekends on [email or web address]

## 2023-06-19 NOTE — TOC Transition Note (Signed)
 Transition of Care [facility]) - Discharge Note   Patient Details  Name: Erin Riley MRN: [id number] Date of Birth: 12-29-1950  Transition of [facility]) CM/SW Contact:  Ernst Heap Phone Number: [phone] 06/19/2023, 12:24 PM   Clinical Narrative:   12:16 PM- HF CSW called and spoke with the patients son. CSW informed the patients son of her upcoming follow up appointments. CSW explained that the information will also be on the DC summary paperwork. CSW discussed the transportation plan for upcoming appointments. Patients son stated that he works and the only one who will provide transportation. CSW explained that Medicaid transportation can be arranged and the process.   Patients son will be transporting her home today once he gets off work at 5:00 PM today.     Final next level of care: Home w Home Health Services Barriers to Discharge: Continued Medical Work up   Patient Goals and CMS Choice Patient states their goals for this hospitalization and ongoing recovery are:: get better CMS Medicare.gov Compare Post Acute Care list provided to:: Patient Choice offered to / list presented to : Patient      Discharge Placement                       Discharge Plan and Services Additional resources added to the After Visit Summary for   In-house Referral: NA Discharge Planning Services: CM Consult Post Acute Care Choice: Durable Medical Equipment, Home Health          DME Arranged: Walker rolling with seat DME Agency: [employer] Date DME Agency Contacted: 06/14/23 Time DME Agency Contacted: 1204 Representative spoke with at DME Agency: Zula Hitch HH Arranged: PT          Social Drivers of Health (SDOH) Interventions SDOH Screenings   Food Insecurity: No Food Insecurity (06/13/2023)  Housing: High Risk (06/13/2023)  Transportation Needs: No Transportation Needs (06/13/2023)  Utilities: Not At Risk (06/13/2023)  Alcohol Screen: Low Risk  (06/06/2022)   Depression (PHQ2-9): Low Risk  (01/26/2021)  Social Connections: Moderately Isolated (06/13/2023)  Tobacco Use: High Risk (06/12/2023)     Readmission Risk Interventions    06/14/2023   11:56 AM 09/14/2022   10:20 AM  Readmission Risk Prevention Plan  Transportation Screening Complete Complete  PCP or Specialist Appt within 3-5 Days Complete Complete  HRI or Home Care Consult Complete Complete  Social Work Consult for Recovery Care Planning/Counseling  Complete  Palliative Care Screening Not Applicable Not Applicable  Medication Review Oceanographer) Complete Referral to Pharmacy

## 2023-06-19 NOTE — Discharge Instructions (Signed)
 TRANSPORTATION: -[place]decai Maes Department of Health: Call [facility] and [employer] at [phone] for details. [email or web address]  -[employer] [place]: [employer] [place] is the [employer]'s shared-ride transportation service for eligible riders who have a disability that prevents them from riding the fixed route bus. Call [phone]. [employer] [place] riders must pay a fare of $1.50 per trip, or may purchase a 10-ride punch card for $14.00 ($1.40 per ride) or a 40-ride punch card for $48.00 ($1.20 per ride).  -[employer] rideshare transportation service is provided for senior citizens (60+) who live independently within [place] city limits and are unable to drive or have limited access to transportation. Call [phone] to schedule an appointment.  -[place] [employer]: For Medicare or Medicaid recipients call [phone]?[name], wheelchair Zenaida Niece, and ambulatory quotes available.   MEDICAID TRANSPORTATION: -If you have a Medicaid "blue card" or "pink card" and have no other means for transportation to doctor's offices, clinics, dentists, hospitals, and other health related trip needs.  -Transportation services are available to all [place] locations. Trips to [facility] and [place] are provided in association with [employer]. -Services are provided between 6:00AM and 9:00PM Monday-Friday. -Call [phone] to schedule a trip or request further information.  medications, including herbals, vitamins, non-steroidal anti-inflammatory  drugs (NSAIDs) and supplements.  This website has more information on Eliquis (apixaban): http://www.eliquis.com/eliquis/home

## 2023-06-19 NOTE — Plan of Care (Signed)
   Problem: Clinical Measurements: Goal: Cardiovascular complication will be avoided Outcome: Progressing   Problem: Activity: Goal: Risk for activity intolerance will decrease Outcome: Progressing

## 2023-06-19 NOTE — Progress Notes (Signed)
 Discharge instructions (including medications) discussed with and copy provided to patient/caregiver

## 2023-06-19 NOTE — Discharge Summary (Signed)
 Physician Discharge Summary   Patient: Erin Riley MRN: [id number] DOB: November 03, 1950  Admit date:     06/12/2023  Discharge date: 06/19/23  Discharge Physician: Aneita Keens, MD   PCP: Joaquin Mulberry, MD   Recommendations at discharge:  PCP visit for hospital follow-up Cardiology visit for hospital follow-up  Discharge Diagnoses: Principal Problem:   Acute on chronic congestive heart failure [facility]) Active Problems:   Severe aortic stenosis   Essential hypertension   Schizophrenia (HCC)   Nonrheumatic aortic valve stenosis   AKI (acute kidney injury) (HCC)   CKD stage 3b, GFR 30-44 ml/min (HCC)   Obesity, Class I, BMI 30-34.9  Resolved Problems:   * No resolved hospital problems. *  Hospital Course: Erin Riley is a 73 y.o. female with a history of hypertension, hypertrophic cardiomyopathy, chronic combined diastolic and systolic heart failure, PAD, CKD stage IIIb, hyperlipidemia, schizophrenia, AAA.  Patient presented secondary to progressively worsening dyspnea and found to have evidence of acute heart failure.  Patient started on IV Lasix for management. Cardiology consulted for co-management.  Patient successfully diuresed.  Patient transition to a simplified outpatient regimen on discharge to account for patient's history of medication nonadherence.  Assessment and Plan:  Acute on chronic combined HFrEF Present on admission.  LVEF of 40 to 45% with grade 1 diastolic dysfunction noted on echocardiogram from July 2024.  Patient with elevated BNP of 3463 with associated dyspnea, lower extremity swelling, vascular congestion with concern for possible pulmonary edema on chest x-ray.  Patient is managed on Lasix 40 mg daily and Jardiance 10 mg daily patient started on Lasix IV for management.  Transthoracic Echocardiogram this admission significant for worsening LV function with EF of 25-30% and associated grade 2 diastolic dysfunction. Cardiology consulted. Initial weight of 104.4 kg.  Weight down to 97.5 kg on discharge. Continue losartan, coreg and lasix on discharge. Continue Jardiance.   Moderate MV/TV regurgitation Severe aortic valve stenosis Unlikely candidate for TAVR, per cardiology.   Demand ischemia Elevated troponin of 57 with negative delta of 61.  No evidence of acute ischemia on EKG.  In setting of acute heart failure.   Primary hypertension Patient is on amlodipine, Coreg, Imdur, hydralazine as an outpatient. Amlodipine and Imdur held secondary to low-normal blood pressure. Discharge on Coreg and losartan.   Anemia of chronic kidney disease Stable.   AKI on CKD stage IIIb Baseline creatinine of 1.4-1.5.  Creatinine of 1.81 on admission with acute worsening to 2.0.  Improved initially with IV diuresis but is now worsened again; creatinine stable at 1.79. Lasix IV discontinued. Creatinine stabilized once transitioned to Lasix PO.   Schizophrenia Mood disorder Continue home Zyprexa.   Obesity, class I Estimated body mass index is 33.67 kg/m as calculated from the following:   Height as of this encounter: 5\' 7"  (1.702 m).   Weight as of this encounter: 97.5 kg.   Consultants:  Cardiology   Procedures:  Transthoracic Echocardiogram  Disposition: Home Diet recommendation: Cardiac diet   DISCHARGE MEDICATION: Allergies as of 06/19/2023   No Known Allergies      Medication List     STOP taking these medications    amLODipine 10 MG tablet Commonly known as: NORVASC   hydrALAZINE 100 MG tablet Commonly known as: APRESOLINE   isosorbide mononitrate 60 MG 24 hr tablet Commonly known as: IMDUR       TAKE these medications    aspirin EC 81 MG tablet Take 1 tablet (81 mg total) by mouth  daily. Swallow whole.   atorvastatin 40 MG tablet Commonly known as: LIPITOR Take 1 tablet (40 mg total) by mouth at bedtime.   carvedilol 3.125 MG tablet Commonly known as: COREG Take 1 tablet (3.125 mg total) by mouth 2 (two) times daily  with a meal. What changed:  medication strength how much to take   FeroSul 325 (65 Fe) MG tablet Generic drug: ferrous sulfate Take 1 tablet (325 mg total) by mouth daily with breakfast.   furosemide 40 MG tablet Commonly known as: LASIX Take 1 tablet (40 mg total) by mouth daily.   Jardiance 10 MG Tabs tablet Generic drug: empagliflozin Take 1 tablet (10 mg total) by mouth daily.   losartan 25 MG tablet Commonly known as: COZAAR Take 1 tablet (25 mg total) by mouth daily. Start taking on: June 20, 2023   OLANZapine 5 MG tablet Commonly known as: ZYPREXA Take 5 mg by mouth at bedtime.   potassium chloride 10 MEQ tablet Commonly known as: KLOR-CON M Take 1 tablet (10 mEq total) by mouth daily.               Durable Medical Equipment  (From admission, onward)           Start     Ordered   06/14/23 1202  For home use only DME 4 wheeled rolling walker with seat  Once       Question:  Patient needs a walker to treat with the following condition  Answer:  Weakness   06/14/23 1201            Follow-up Information     [facility] Follow up.   Why: rollator Contact information: [phone]        [facility] Follow up.   Specialty: Home Health Services Why: name is now [facility] will call you to set up apt times Contact information: [address] [place] [address] [phone]         Hoy Register, MD. Go in 16 day(s).   Specialty: Family Medicine Why: Hospital folow up appointment scheduled for Wednesday, July 04, 2023 at 9:10 AM.  PLEASE ARRIVE 10-15  minute searly.  PLEASE call to cancel'reschedule if you CANNOT make appointment. Contact information: [address] [place] [address] [place] [address] [phone]         [facility] Follow up on 07/05/2023.   Specialty: Cardiology Why: at 2 pm Heart and V[facility] Tower Entrance C Contact  information: [address] [place] [address] [phone]               Discharge Exam: BP (!) 151/98 (BP Location: Right Wrist)   Pulse 78   Temp 98 F (36.7 C) (Oral)   Resp 17   Ht 5\' 7"  (1.702 m)   Wt 97.5 kg   LMP 05/10/2013   SpO2 94%   BMI 33.67 kg/m   General exam: Appears calm and comfortable Respiratory system: Clear to auscultation. Respiratory effort normal. Cardiovascular system: S1 & S2 heard, RRR. No murmurs, rubs, gallops or clicks. Gastrointestinal system: Abdomen is nondistended, soft and nontender. Normal bowel sounds heard. Central nervous system: Alert and oriented. No focal neurological deficits. Musculoskeletal: No edema. No calf tenderness Psychiatry: Judgement and insight appear normal. Mood & affect appropriate.   Condition at discharge: stable  The results of significant diagnostics from this hospitalization (including imaging, microbiology, ancillary and laboratory) are listed  below for reference.   Imaging Studies: ECHOCARDIOGRAM COMPLETE Result Date: 06/13/2023    ECHOCARDIOGRAM REPORT   Patient Name:   Erin Riley Erin Riley Date of Exam: 06/13/2023 Medical Rec #:  [id number]   Height:       68.0 in Accession #:    [id number]  Weight:       200.0 lb Date of Birth:  [birth year]   BSA:          2.044 m Patient Age:    73 years    BP:           113/87 mmHg Patient Gender: F           HR:           60 bpm. Exam Location:  Inpatient Procedure: 2D Echo, Cardiac Doppler and Color Doppler (Both Spectral and Color            Flow Doppler were utilized during procedure). Indications:    CHF  History:        Patient has prior history of Echocardiogram examinations, most                 recent 09/11/2022. CHF, CAD, PAD, Aortic Valve Disease; Risk                 Factors:Hypertension and Dyslipidemia.  Sonographer:    Sulema Endo RDCS, FE, PE Referring Phys: [id number] CAROLE N HALL IMPRESSIONS  1. Left ventricular ejection fraction, by estimation, is 25 to  30%. The left ventricle has severely decreased function. The left ventricle demonstrates global hypokinesis. The left ventricular internal cavity size was severely dilated. There is severe left ventricular hypertrophy. Left ventricular diastolic parameters are consistent with Grade II diastolic dysfunction (pseudonormalization). Elevated left ventricular end-diastolic pressure.  2. Right ventricular systolic function is normal. The right ventricular size is moderately enlarged. There is severely elevated pulmonary artery systolic pressure. The estimated right ventricular systolic pressure is 66.3 mmHg.  3. Left atrial size was severely dilated.  4. Right atrial size was moderately dilated.  5. A small pericardial effusion is present. There is no evidence of cardiac tamponade.  6. The mitral valve is grossly normal. Moderate mitral valve regurgitation. No evidence of mitral stenosis. Moderate mitral annular calcification.  7. Tricuspid valve regurgitation is moderate.  8. The aortic valve is calcified. There is severe calcifcation of the aortic valve. Aortic valve regurgitation is mild to moderate. Severe aortic valve stenosis. Aortic valve area, by VTI measures 0.95 cm. Aortic valve mean gradient measures 33.3 mmHg.  Aortic valve Vmax measures 3.85 m/s.  9. Aortic dilatation noted. There is borderline dilatation of the ascending aorta, measuring 39 mm. 10. The inferior vena cava is dilated in size with <50% respiratory variability, suggesting right atrial pressure of 15 mmHg. Comparison(s): Changes from prior study are noted. Conclusion(s)/Recommendation(s): Severe LVH, with worsening LV dilation and function. 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 Worsening MR and TR. Severe aortic stenosis by valve area. Findings communicated to Dr. Duard Getting. FINDINGS  Left Ventricle: Left ventricular ejection fraction, by estimation, is 25 to 30%. The left ventricle has severely decreased function. The left ventricle  demonstrates global hypokinesis. The left ventricular internal cavity size was severely dilated. There is severe left ventricular hypertrophy. Left ventricular diastolic parameters are consistent with Grade II diastolic dysfunction (pseudonormalization). Elevated left ventricular end-diastolic pressure. Right Ventricle: The right ventricular size is moderately enlarged. Right vetricular wall thickness was not well visualized. Right ventricular systolic function is normal. There  is severely elevated pulmonary artery systolic pressure. The tricuspid regurgitant velocity is 3.58 m/s, and with an assumed right atrial pressure of 15 mmHg, the estimated right ventricular systolic pressure is 66.3 mmHg. Left Atrium: Left atrial size was severely dilated. Right Atrium: Right atrial size was moderately dilated. Pericardium: A small pericardial effusion is present. There is no evidence of cardiac tamponade. Mitral Valve: The mitral valve is grossly normal. Moderate mitral annular calcification. Moderate mitral valve regurgitation. No evidence of mitral valve stenosis. Tricuspid Valve: The tricuspid valve is normal in structure. Tricuspid valve regurgitation is moderate . No evidence of tricuspid stenosis. Aortic Valve: The aortic valve is calcified. There is severe calcifcation of the aortic valve. Aortic valve regurgitation is mild to moderate. Aortic regurgitation PHT measures 690 msec. Severe aortic stenosis is present. Aortic valve mean gradient measures 33.3 mmHg. Aortic valve peak gradient measures 59.2 mmHg. Aortic valve area, by VTI measures 0.95 cm. Pulmonic Valve: The pulmonic valve was not well visualized. Pulmonic valve regurgitation is mild. No evidence of pulmonic stenosis. Aorta: Aortic dilatation noted. There is borderline dilatation of the ascending aorta, measuring 39 mm. Venous: The inferior vena cava is dilated in size with less than 50% respiratory variability, suggesting right atrial pressure of 15  mmHg. IAS/Shunts: The atrial septum is grossly normal.  LEFT VENTRICLE PLAX 2D LVIDd:         6.30 cm   Diastology LVIDs:         5.50 cm   LV e' medial:    2.84 cm/s LV PW:         2.10 cm   LV E/e' medial:  29.3 LV IVS:        1.70 cm   LV e' lateral:   3.08 cm/s LVOT diam:     2.30 cm   LV E/e' lateral: 27.0 LV SV:         83 LV SV Index:   41 LVOT Area:     4.15 cm  RIGHT VENTRICLE RV Basal diam:  4.60 cm RV Mid diam:    3.50 cm RV S prime:     9.81 cm/s TAPSE (M-mode): 1.7 cm LEFT ATRIUM            Index        RIGHT ATRIUM           Index LA Vol (A2C): 65.2 ml  31.91 ml/m  RA Area:     21.90 cm LA Vol (A4C): 116.0 ml 56.76 ml/m  RA Volume:   65.10 ml  31.86 ml/m  AORTIC VALVE AV Area (Vmax):    0.96 cm AV Area (Vmean):   0.88 cm AV Area (VTI):     0.95 cm AV Vmax:           384.67 cm/s AV Vmean:          269.000 cm/s AV VTI:            0.879 m AV Peak Grad:      59.2 mmHg AV Mean Grad:      33.3 mmHg LVOT Vmax:         89.30 cm/s LVOT Vmean:        57.200 cm/s LVOT VTI:          0.200 m LVOT/AV VTI ratio: 0.23 AI PHT:            690 msec  AORTA Ao Root diam: 3.00 cm Ao Asc diam:  3.90 cm MITRAL VALVE  TRICUSPID VALVE MV Area (PHT): 3.65 cm    TR Peak grad:   51.3 mmHg MV Decel Time: 208 msec    TR Vmax:        358.00 cm/s MV E velocity: 83.20 cm/s MV A velocity: 53.40 cm/s  SHUNTS MV E/A ratio:  1.56        Systemic VTI:  0.20 m                            Systemic Diam: 2.30 cm Jodelle Red MD Electronically signed by Jodelle Red MD Signature Date/Time: 06/13/2023/5:43:10 PM    Final    DG Chest Port 1 View Result Date: 06/12/2023 CLINICAL DATA:  Dyspnea. EXAM: PORTABLE CHEST 1 VIEW COMPARISON:  05/21/2023 FINDINGS: Stable cardiomegaly. Mild hazy bibasilar airspace disease. Vascular congestion. No pneumothorax or large pleural effusion. Right humeral arthroplasty. IMPRESSION: Cardiomegaly with vascular congestion. Mild hazy bibasilar airspace disease may represent edema  or atelectasis. Electronically Signed   By: Narda Rutherford M.D.   On: 06/12/2023 21:54   DG Chest 2 View Result Date: 05/21/2023 CLINICAL DATA:  Dyspnea, lower extremity swelling for 2 days EXAM: CHEST - 2 VIEW COMPARISON:  06/04/2022 FINDINGS: Frontal and lateral views of the chest demonstrate an enlarged cardiac silhouette. Stable ectasia of the thoracic aorta. No airspace disease, effusion, or pneumothorax. Stable right shoulder arthroplasty. Stable degenerative changes of the left shoulder. IMPRESSION: 1. Stable enlarged cardiac silhouette.  No acute airspace disease. Electronically Signed   By: Sharlet Salina M.D.   On: 05/21/2023 18:44    Microbiology: Results for orders placed or performed during the hospital encounter of 06/12/23  Resp panel by RT-PCR (RSV, Flu A&B, Covid) Anterior Nasal Swab     Status: None   Collection Time: 06/12/23  7:31 PM   Specimen: Anterior Nasal Swab  Result Value Ref Range Status   SARS Coronavirus 2 by RT PCR NEGATIVE NEGATIVE Final   Influenza A by PCR NEGATIVE NEGATIVE Final   Influenza B by PCR NEGATIVE NEGATIVE Final    Comment: (NOTE) The Xpert Xpress SARS-CoV-2/FLU/RSV plus assay is intended as an aid in the diagnosis of influenza from Nasopharyngeal swab specimens and should not be used as a sole basis for treatment. Nasal washings and aspirates are unacceptable for Xpert Xpress SARS-CoV-2/FLU/RSV testing.  Fact Sheet for Patients: BloggerCourse.com  Fact Sheet for Healthcare Providers: SeriousBroker.it  This test is not yet approved or cleared by the [place] FDA and has been authorized for detection and/or diagnosis of SARS-CoV-2 by FDA under an Emergency Use Authorization (EUA). This EUA will remain in effect (meaning this test can be used) for the duration of the COVID-19 declaration under Section 564(b)(1) of the Act, 21 U.S.C. section 360bbb-3(b)(1), unless the authorization is  terminated or revoked.     Resp Syncytial Virus by PCR NEGATIVE NEGATIVE Final    Comment: (NOTE) Fact Sheet for Patients: BloggerCourse.com  Fact Sheet for Healthcare Providers: SeriousBroker.it  This test is not yet approved or cleared by the [place] FDA and has been authorized for detection and/or diagnosis of SARS-CoV-2 by FDA under an Emergency Use Authorization (EUA). This EUA will remain in effect (meaning this test can be used) for the duration of the COVID-19 declaration under Section 564(b)(1) of the Act, 21 U.S.C. section 360bbb-3(b)(1), unless the authorization is terminated or revoked.  Performed at [facility], [address] [place], [place] [address]  Labs: CBC: Recent Labs  Lab 06/12/23 1901 06/12/23 2025 06/12/23 2026 06/13/23 0322  WBC 7.2  --   --  6.4  NEUTROABS 4.9  --   --   --   HGB 9.3* 10.9* 10.5* 9.5*  HCT 31.5* 32.0* 31.0* 32.1*  MCV 98.7  --   --  99.7  PLT 313  --   --  265   Basic Metabolic Panel: Recent Labs  Lab 06/12/23 1901 06/12/23 2025 06/13/23 0322 06/14/23 0324 06/15/23 0311 06/16/23 0340 06/17/23 0254 06/18/23 0305 06/19/23 0907  NA 141   < > 141   < > 141 139 136 137 137  K 4.4   < > 3.8   < > 4.1 3.9 4.4 3.6 4.2  CL 105   < > 106   < > 101 97* 96* 98 101  CO2 25  --  26   < > 32 31 30 29 28   GLUCOSE 94   < > 127*   < > 91 90 109* 133* 115*  BUN 50*   < > 46*   < > 27* 26* 32* 38* 29*  CREATININE 1.81*   < > 1.73*   < > 1.46* 1.53* 1.78* 1.79* 1.42*  CALCIUM 9.0  --  8.7*   < > 8.9 9.2 9.3 9.2 9.5  MG 2.1  --  2.0  --   --   --   --   --  2.2  PHOS  --   --  4.1  --   --   --   --   --   --    < > = values in this interval not displayed.   Liver Function Tests: Recent Labs  Lab 06/12/23 1901  AST 16  ALT 13  ALKPHOS 72  BILITOT 1.3*  PROT 6.9  ALBUMIN 3.2*   CBG: Recent Labs  Lab 06/12/23 1859 06/13/23 1522  GLUCAP 90 102*     Discharge time spent: 35 minutes.  Signed: Aneita Keens, MD [facility] 06/19/2023

## 2023-06-19 NOTE — Plan of Care (Signed)
  Problem: Education: Goal: Knowledge of General Education information will improve Description: Including pain rating scale, medication(s)/side effects and non-pharmacologic comfort measures Outcome: Adequate for Discharge   Problem: Health Behavior/Discharge Planning: Goal: Ability to manage health-related needs will improve Outcome: Adequate for Discharge   Problem: Clinical Measurements: Goal: Ability to maintain clinical measurements within normal limits will improve Outcome: Adequate for Discharge Goal: Will remain free from infection Outcome: Adequate for Discharge Goal: Diagnostic test results will improve Outcome: Adequate for Discharge Goal: Respiratory complications will improve Outcome: Adequate for Discharge Goal: Cardiovascular complication will be avoided Outcome: Adequate for Discharge   Problem: Activity: Goal: Risk for activity intolerance will decrease Outcome: Adequate for Discharge   Problem: Nutrition: Goal: Adequate nutrition will be maintained Outcome: Adequate for Discharge   Problem: Coping: Goal: Level of anxiety will decrease Outcome: Adequate for Discharge   Problem: Elimination: Goal: Will not experience complications related to bowel motility Outcome: Adequate for Discharge Goal: Will not experience complications related to urinary retention Outcome: Adequate for Discharge   Problem: Pain Managment: Goal: General experience of comfort will improve and/or be controlled Outcome: Adequate for Discharge   Problem: Safety: Goal: Ability to remain free from injury will improve Outcome: Adequate for Discharge   Problem: Skin Integrity: Goal: Risk for impaired skin integrity will decrease Outcome: Adequate for Discharge   Problem: Education: Goal: Ability to demonstrate management of disease process will improve Outcome: Adequate for Discharge Goal: Ability to verbalize understanding of medication therapies will improve Outcome: Adequate  for Discharge Goal: Individualized Educational Video(s) Outcome: Adequate for Discharge   Problem: Activity: Goal: Capacity to carry out activities will improve Outcome: Adequate for Discharge   Problem: Cardiac: Goal: Ability to achieve and maintain adequate cardiopulmonary perfusion will improve Outcome: Adequate for Discharge   Problem: Acute Rehab PT Goals(only PT should resolve) Goal: Patient Will Transfer Sit To/From Stand Outcome: Adequate for Discharge Goal: Pt Will Ambulate Outcome: Adequate for Discharge Goal: PT Additional Goal #1 Outcome: Adequate for Discharge   Problem: Acute Rehab OT Goals (only OT should resolve) Goal: OT Additional ADL Goal #1 Outcome: Adequate for Discharge Goal: OT Additional ADL Goal #2 Outcome: Adequate for Discharge Goal: OT Additional ADL Goal #3 Outcome: Adequate for Discharge Goal: OT Additional ADL Goal #4 Outcome: Adequate for Discharge

## 2023-06-19 NOTE — Progress Notes (Signed)
 Mobility Specialist Progress Note:   06/19/23 1045  Mobility  Activity Ambulated with assistance in hallway  Level of Assistance Standby assist, set-up cues, supervision of patient - no hands on  Assistive Device Four wheel walker  Distance Ambulated (ft) 400 ft  Activity Response Tolerated well  Mobility Referral Yes  Mobility visit 1 Mobility  Mobility Specialist Start Time (ACUTE ONLY) 1045  Mobility Specialist Stop Time (ACUTE ONLY) 1059  Mobility Specialist Time Calculation (min) (ACUTE ONLY) 14 min   Pt agreeable to mobility session. Required no physical assistance throughout ambulation with rollator. No c/o throughout. Pt back in bed with all needs met.   Erin Riley Mobility Specialist Please contact via SecureChat or  Rehab office at [phone]

## 2023-06-20 ENCOUNTER — Telehealth: Payer: Self-pay | Admitting: Family Medicine

## 2023-06-20 ENCOUNTER — Telehealth: Payer: Self-pay | Admitting: *Deleted

## 2023-06-20 NOTE — Transitions of Care (Post Inpatient/ED Visit) (Signed)
   06/20/2023  Name: Erin Riley MRN: [id number] DOB: 03-Jul-1950  Today's TOC FU Call Status: Today's TOC FU Call Status:: Unsuccessful Call (1st Attempt) Unsuccessful Call (1st Attempt) Date: 06/20/23  Attempted to reach the patient regarding the most recent Inpatient/ED visit.  Follow Up Plan: Additional outreach attempts will be made to reach the patient to complete the Transitions of Care (Post Inpatient/ED visit) call. RNCM will attempt to reach patient/DPR between 12-1pm today, as requested by DPR.  Arna Better RN, BSN [place]  [facility] RN Care Manager [phone]

## 2023-06-20 NOTE — Transitions of Care (Post Inpatient/ED Visit) (Signed)
 06/20/2023  Name: Erin Riley MRN: [id number] DOB: August 11, 1950  Today's TOC FU Call Status: Today's TOC FU Call Status:: Successful TOC FU Call Completed TOC FU Call Complete Date: 06/20/23 Patient's Name and Date of Birth confirmed.  Transition Care Management Follow-up Telephone Call Date of Discharge: 06/19/23 Discharge Facility: [name]) Type of Discharge: Inpatient Admission Primary Inpatient Discharge Diagnosis:: Acute on chronic congestive heart failure How have you been since you were released from the hospital?: Better Any questions or concerns?: No  Items Reviewed: Did you receive and understand the discharge instructions provided?: Yes Dietary orders reviewed?: Yes Type of Diet Ordered:: Cardiac diet Do you have support at home?: No (Son/[name] is very supportive, lives 8 minutes away)  Medications Reviewed Today: Medications Reviewed Today     Reviewed by Heidi Dach, RN (Registered Nurse) on 06/20/23 at 1228  Med List Status: <None>   Medication Order Taking? Sig Documenting Provider Last Dose Status Informant  aspirin EC 81 MG tablet [id number] Yes Take 1 tablet (81 mg total) by mouth daily. Swallow whole. Leroy Sea, MD Taking Active Self, Pharmacy Records           Med Note [facility] [place], SUSAN A   Wed Jun 13, 2023  2:50 PM) Pt adamant of taking medication. Dispense report does not support this claim.  atorvastatin (LIPITOR) 40 MG tablet [id number] Yes Take 1 tablet (40 mg total) by mouth at bedtime. Narda Bonds, MD Taking Active   carvedilol (COREG) 3.125 MG tablet [id number] Yes Take 1 tablet (3.125 mg total) by mouth 2 (two) times daily with a meal. Narda Bonds, MD Taking Active   empagliflozin (JARDIANCE) 10 MG TABS tablet [id number] Yes Take 1 tablet (10 mg total) by mouth daily. Narda Bonds, MD Taking Active   ferrous sulfate 325 (65 FE) MG tablet [id number] No Take 1 tablet (325 mg total) by mouth daily with breakfast.  Patient  not taking: Reported on 06/20/2023   Leroy Sea, MD Not Taking Active Self, Pharmacy Records  furosemide (LASIX) 40 MG tablet [id number] Yes Take 1 tablet (40 mg total) by mouth daily. Narda Bonds, MD Taking Active   losartan (COZAAR) 25 MG tablet [id number] Yes Take 1 tablet (25 mg total) by mouth daily. Narda Bonds, MD Taking Active   OLANZapine (ZYPREXA) 5 MG tablet [id number] Yes Take 5 mg by mouth at bedtime. [provider] Taking Active Self, Pharmacy Records           Med Note [facility] [place], SUSAN A   Wed Jun 13, 2023  2:50 PM)    potassium chloride (KLOR-CON M) 10 MEQ tablet [id number] Yes Take 1 tablet (10 mEq total) by mouth daily. Narda Bonds, MD Taking Active             Home Care and Equipment/Supplies: Were Home Health Services Ordered?: Yes Name of Home Health Agency:: [facility] Has Agency set up a time to come to your home?: No EMR reviewed for Home Health Orders:  (Orders present. [facility] has contacted [facility]. [facility] will call today to schedule first home visit) Any new equipment or medical supplies ordered?: Yes Name of Medical supply agency?: [facility] Were you able to get the equipment/medical supplies?: Yes Do you have any questions related to the use of the equipment/supplies?: No  Functional Questionnaire: Do you need assistance with bathing/showering or dressing?: No Do you need assistance with meal preparation?: No Do you need assistance with  eating?: No Do you have difficulty maintaining continence: No Do you need assistance with getting out of bed/getting out of a chair/moving?: No Do you have difficulty managing or taking your medications?: Yes ([facility]/Son assisting with medication management)  Follow up appointments reviewed: PCP Follow-up appointment confirmed?: Yes Date of PCP follow-up appointment?: 07/04/23 Follow-up Provider: Dulce Gibbs Specialist [facility] Follow-up appointment confirmed?: Yes Date of  Specialist follow-up appointment?: 07/05/23 Follow-Up Specialty Provider:: [facility] Do you need transportation to your follow-up appointment?: No Do you understand care options if your condition(s) worsen?: Yes-patient verbalized understanding  SDOH Interventions Today    Flowsheet Row Most Recent Value  SDOH Interventions   Food Insecurity Interventions Intervention Not Indicated  Housing Interventions Intervention Not Indicated  Transportation Interventions Intervention Not Indicated  Utilities Interventions Intervention Not Indicated       Arna Better RN, BSN [place]  [facility] RN Care Manager [phone]

## 2023-06-20 NOTE — Telephone Encounter (Signed)
 FYI

## 2023-06-20 NOTE — Telephone Encounter (Signed)
 FYI  Copied from CRM [id number]. Topic: Clinical - Home Health Verbal Orders  >> Jun 20, 2023 12:58 PM Crispin Dolphin wrote:  Caller/Agency: Nira Basset with [facility] Number: [phone] Service Requested: N/A - notify provider of delay of care - to start 4/18 - no order needed just notification to provider Frequency: N/A Any new concerns about the patient? No

## 2023-06-25 NOTE — Telephone Encounter (Signed)
 FYI  Copied from CRM [id number]. Topic: Clinical - Home Health Verbal Orders  >> Jun 25, 2023 12:58 PM Danae Duncans wrote  Caller/Agency: [facility]  Callback Number: [phone] Service Requested: Physical Therapy Frequency: 1 time for week 1, 2 times for 2 weeks, and 1 time for 3 weeks to work on balance strength  Any new concerns about the patient? Yes- Request for Nurse/social worker to see pt in the home

## 2023-06-25 NOTE — Telephone Encounter (Signed)
Okay to give orders?

## 2023-06-25 NOTE — Telephone Encounter (Signed)
 Ok to give orders

## 2023-06-26 NOTE — Telephone Encounter (Signed)
 Returned call and gave okay for verbal orders. Spoke with [place] physical therapist

## 2023-06-28 ENCOUNTER — Telehealth: Payer: Self-pay | Admitting: Family Medicine

## 2023-06-28 NOTE — Telephone Encounter (Signed)
 Copied from CRM [id number]. Topic: Clinical - Request for Lab/Test Order >> Jun 28, 2023 10:29 AM Juluis Ok wrote: Reason for CRM: Patient requesting to have an ultrasound completed for pregnancy. She states she is 3 months pregnant. Callback # [phone]

## 2023-07-04 ENCOUNTER — Ambulatory Visit: Admitting: Physician Assistant

## 2023-07-04 ENCOUNTER — Telehealth (HOSPITAL_COMMUNITY): Payer: Self-pay | Admitting: *Deleted

## 2023-07-04 NOTE — Telephone Encounter (Signed)
 Error

## 2023-07-04 NOTE — Telephone Encounter (Signed)
 Called to confirm/remind patient of their appointment at the [facility] on 06/08/23.    Appointment:              [] Confirmed             [x] Left mess              [] No answer/No voice mail             [] Phone not in service   Patient reminded to bring all medications and/or complete list.   Confirmed patient has transportation. Gave directions, instructed to utilize valet parking.

## 2023-07-05 ENCOUNTER — Encounter (HOSPITAL_COMMUNITY)

## 2023-07-06 ENCOUNTER — Telehealth: Payer: Self-pay | Admitting: Family Medicine

## 2023-07-06 NOTE — Telephone Encounter (Signed)
 FYI  Copied from CRM [id number]. Topic: Clinical - Medical Advice >> Jul 06, 2023  4:38 PM Rennis Case wrote: Reason for CRM: Polly Brink w/ [facility] calling to report patient's weight gain. In their charts, pt's weight reported as 180lbs on 06/22/23, possibly by word of mouth.   Weight taken yesterday and it was 220lbs.

## 2023-07-09 NOTE — Telephone Encounter (Unsigned)
 Copied from CRM [id number]. Topic: Clinical - Home Health Verbal Orders >> Jul 09, 2023  9:23 AM Baldomero Bone wrote: Caller/Agency: Deandra with [facility] Callback Number: [phone] Any new concerns about the patient? Yes. no other visits scheduled. Patient does not have a skilled nursing need. Patient will continue physical therapy. Patient is non-compliant with taking medication because she said she is pregnant. A psychiatric evaluation may be warranted.

## 2023-07-09 NOTE — Telephone Encounter (Signed)
 Noted.

## 2023-07-09 NOTE — Telephone Encounter (Signed)
 fyi

## 2023-07-17 NOTE — Telephone Encounter (Signed)
 Copied from CRM [id number]. Topic: Clinical - Home Health Verbal Orders >> Jul 17, 2023  4:31 PM Star East wrote:  Caller/Agency: Landon Pinion with [facility] Callback Number: [phone] Service Requested: home health Frequency: none Any new concerns about the patient? Yes- patient has some weight gain and is up to 219 from 214

## 2023-07-18 NOTE — Telephone Encounter (Signed)
 FYI  Weight gain

## 2023-07-18 NOTE — Telephone Encounter (Signed)
 Need to ensure she is taking her Lasix  as prescribed as nonadherence can lead to weight gain.

## 2023-07-24 NOTE — Telephone Encounter (Signed)
 Landon Pinion was called and he wants to change patient baseline weight to 220 since he feels this is her base weight, He states he will check with patient to ensure she is taking her lasix .

## 2023-07-25 ENCOUNTER — Inpatient Hospital Stay: Admitting: Nurse Practitioner

## 2023-08-01 ENCOUNTER — Telehealth (HOSPITAL_COMMUNITY): Payer: Self-pay

## 2023-08-01 NOTE — Telephone Encounter (Signed)
 Called to confirm/remind patient of their appointment at the [facility] on 08/02/2023 9:00.   Appointment:   [] Confirmed  [x] Left mess   [] No answer/No voice mail  [] VM Full/unable to leave message  [] Phone not in service  Patient reminded to bring all medications and/or complete list.  Confirmed patient has transportation. Gave directions, instructed to utilize valet parking.

## 2023-08-02 ENCOUNTER — Ambulatory Visit (HOSPITAL_COMMUNITY)
Admission: RE | Admit: 2023-08-02 | Discharge: 2023-08-02 | Disposition: A | Discharge status: Home / Self Care | Source: Ambulatory Visit | Attending: Family Medicine | Admitting: Family Medicine

## 2023-08-02 ENCOUNTER — Encounter (HOSPITAL_COMMUNITY): Payer: Self-pay

## 2023-08-02 ENCOUNTER — Ambulatory Visit (HOSPITAL_COMMUNITY): Payer: Self-pay | Admitting: Family Medicine

## 2023-08-02 VITALS — BP 126/82 | HR 77 | Ht 67.0 in | Wt 224.8 lb

## 2023-08-02 DIAGNOSIS — Z7984 Long term (current) use of oral hypoglycemic drugs: Secondary | ICD-10-CM | POA: Insufficient documentation

## 2023-08-02 DIAGNOSIS — I130 Hypertensive heart and chronic kidney disease with heart failure and stage 1 through stage 4 chronic kidney disease, or unspecified chronic kidney disease: Secondary | ICD-10-CM | POA: Diagnosis not present

## 2023-08-02 DIAGNOSIS — Z91148 Patient's other noncompliance with medication regimen for other reason: Secondary | ICD-10-CM | POA: Insufficient documentation

## 2023-08-02 DIAGNOSIS — I739 Peripheral vascular disease, unspecified: Secondary | ICD-10-CM | POA: Diagnosis not present

## 2023-08-02 DIAGNOSIS — Z79899 Other long term (current) drug therapy: Secondary | ICD-10-CM | POA: Diagnosis not present

## 2023-08-02 DIAGNOSIS — I350 Nonrheumatic aortic (valve) stenosis: Secondary | ICD-10-CM | POA: Diagnosis not present

## 2023-08-02 DIAGNOSIS — F209 Schizophrenia, unspecified: Secondary | ICD-10-CM | POA: Diagnosis not present

## 2023-08-02 DIAGNOSIS — I10 Essential (primary) hypertension: Secondary | ICD-10-CM | POA: Diagnosis not present

## 2023-08-02 DIAGNOSIS — M7989 Other specified soft tissue disorders: Secondary | ICD-10-CM | POA: Insufficient documentation

## 2023-08-02 DIAGNOSIS — M25561 Pain in right knee: Secondary | ICD-10-CM | POA: Insufficient documentation

## 2023-08-02 DIAGNOSIS — E785 Hyperlipidemia, unspecified: Secondary | ICD-10-CM | POA: Insufficient documentation

## 2023-08-02 DIAGNOSIS — Z87891 Personal history of nicotine dependence: Secondary | ICD-10-CM | POA: Diagnosis not present

## 2023-08-02 DIAGNOSIS — I2510 Atherosclerotic heart disease of native coronary artery without angina pectoris: Secondary | ICD-10-CM | POA: Diagnosis not present

## 2023-08-02 DIAGNOSIS — I5022 Chronic systolic (congestive) heart failure: Secondary | ICD-10-CM

## 2023-08-02 DIAGNOSIS — N183 Chronic kidney disease, stage 3 unspecified: Secondary | ICD-10-CM | POA: Diagnosis not present

## 2023-08-02 DIAGNOSIS — Z139 Encounter for screening, unspecified: Secondary | ICD-10-CM

## 2023-08-02 LAB — BASIC METABOLIC PANEL WITH GFR
Anion gap: 8 (ref 5–15)
BUN: 19 mg/dL (ref 8–23)
CO2: 22 mmol/L (ref 22–32)
Calcium: 9.2 mg/dL (ref 8.9–10.3)
Chloride: 106 mmol/L (ref 98–111)
Creatinine, Ser: 1.24 mg/dL — ABNORMAL HIGH (ref 0.44–1.00)
GFR, Estimated: 46 mL/min — ABNORMAL LOW (ref >60)
Glucose, Bld: 98 mg/dL (ref 70–99)
Potassium: 3.9 mmol/L (ref 3.5–5.1)
Sodium: 136 mmol/L (ref 135–145)

## 2023-08-02 LAB — BRAIN NATRIURETIC PEPTIDE: B Natriuretic Peptide: 950.5 pg/mL — ABNORMAL HIGH (ref 0.0–100.0)

## 2023-08-02 MED ORDER — LOSARTAN POTASSIUM 25 MG PO TABS: 25.0000 mg | ORAL_TABLET | Freq: Every day | ORAL | 3 refills | Status: DC

## 2023-08-02 MED ORDER — CARVEDILOL 3.125 MG PO TABS: 3.1250 mg | ORAL_TABLET | Freq: Two times a day (BID) | ORAL | 3 refills | Status: DC

## 2023-08-02 MED ORDER — ASPIRIN 81 MG PO TBEC: 81.0000 mg | DELAYED_RELEASE_TABLET | Freq: Every day | ORAL | 3 refills | Status: AC

## 2023-08-02 MED ORDER — ATORVASTATIN CALCIUM 40 MG PO TABS: 40.0000 mg | ORAL_TABLET | Freq: Every day | ORAL | 3 refills | Status: DC

## 2023-08-02 MED ORDER — FUROSEMIDE 40 MG PO TABS: 80.0000 mg | ORAL_TABLET | Freq: Every day | ORAL | 3 refills | Status: DC

## 2023-08-02 MED ORDER — POTASSIUM CHLORIDE CRYS ER 10 MEQ PO TBCR: 20.0000 meq | EXTENDED_RELEASE_TABLET | Freq: Every day | ORAL | 3 refills | Status: DC

## 2023-08-02 MED ORDER — EMPAGLIFLOZIN 10 MG PO TABS: 10.0000 mg | ORAL_TABLET | Freq: Every day | ORAL | 3 refills | Status: DC

## 2023-08-02 NOTE — Patient Instructions (Signed)
 INCREASE Lasix  to 80 mg daily.  INCREASE Potassium to 20 mEq ( 2 Tabs) daily.  Labs done today, your results will be available in MyChart, we will contact you for abnormal readings.  Your physician recommends that you schedule a follow-up appointment as scheduled.  If you have any questions or concerns before your next appointment please send us  a message through [place] or call our office at [phone].    TO LEAVE A MESSAGE FOR THE NURSE SELECT OPTION 2, PLEASE LEAVE A MESSAGE INCLUDING: YOUR NAME DATE OF BIRTH CALL BACK NUMBER REASON FOR CALL**this is important as we prioritize the call backs  YOU WILL RECEIVE A CALL BACK THE SAME DAY AS LONG AS YOU CALL BEFORE 4:00 PM  At the [facility], you and your health needs are our priority. As part of our continuing mission to provide you with exceptional heart care, we have created designated Provider Care Teams. These Care Teams include your primary Cardiologist (physician) and Advanced Practice Providers (APPs- Physician Assistants and Nurse Practitioners) who all work together to provide you with the care you need, when you need it.   You may see any of the following providers on your designated Care Team at your next follow up: Dr Jules Oar Dr Peder Bourdon Dr. Alwin Baars Dr. Arta Lark Amy Marijane Shoulders, NP Ruddy Corral, [facility] [place], [place] Dennise Fitz, NP [place] Lee, NP Shawnee Dellen, NP Luster Salters, PharmD Bevely Brush, PharmD   Please be sure to bring in all your medications bottles to every appointment.    Thank you for choosing [facility]

## 2023-08-02 NOTE — Progress Notes (Signed)
 [facility] CONSULT NOTE   Primary Care: Joaquin Mulberry, MD Primary Cardiologist: Randene Bustard, MD HF Cardiologist: Dr. Mitzie Anda  HPI: 73 y.o. female w/ h/o combined systolic and diastolic heart failure w/ previous echos showing severe LVH and GIIIDD, felt to be hypertensive heart disease in the setting of uncontrolled HTN/ poor med compliance vs HCM. She has never had cMRI. She did have prior LHC in 2011 that showed mild nonobstructive CAD. Also w/ mod-severe AS but not referred for valve replacement surgery given concern regarding physiatric issues (schizophrenia) and poor compliance. Other medical issues include HTN, CKD III, HLD and PAD/ thoracoabdominal aortic aneurysms followed by [facility]. Last CT was 7/24 (see below).    Echo 09/2022 showed EF40-45%, severe concentric LVH, normal RV, tricuspid AoV w/ severe calcification and stenosis w/ mean gradient 37 mmHg and near critical AVA of 0.84 cm2. EF had been as low as 25-30% 03/2022.    Admitted 4/25 with a/c HF and hypertensive. Diuresed with IV Lasix . Echo showed EF now 25-30%, severe LVH, GIIDD, RV normal w/ severely elevated RVSP 66 mmhg, severe AS mean gradient 33.5 mmHg, mild-mod AI, IVC dilated. cMRI not pursued to further evaluate HCM or amyloidosis, due to her psych issues. GDMT titrated and she was discharged home, weight 214 lbs.    Today she returns for post hospital HF follow up. Overall feeling fine. She is in [facility] today due to R knee pain/swelling. She lives with her son who helps manage her medications. She is able to complete ADLs and cook for herself without dyspnea. Denies palpitations, abnormal bleeding, CP, dizziness, edema, or PND/Orthopnea. Appetite ok. Does not weigh at home, does not have scale. Taking all medications. No tobacco, drug or ETOH use.  ECG (personally reviewed): NSR 77 bpm  ReDs reading: 42%, abnormal  Labs (4/25): K 4.2, creatinine 1.42  Cardiac Studies - Echo 4/25: EF 25-30%, normal RV, moderate  MR, severe AS AVA 0.95 cm, mean gradient 33.3  - Echo 7/24: EF 40-45%, severe LVH, normal RV  Past Medical History:  Diagnosis Date   Chronic back pain    Chronic kidney disease (CKD), stage II (mild)    Class I-II   Coronary artery disease 04/2009   50% stenosis in the perforator of LAD; catheterization was for an abnormal Myoview in January 2000 showing anterior and inferolateral ischemia.   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 > 3 subsequent Echos show improved/normal EF with moderate to severe concentric LVH and diastolic dysfunction with LVOT/intracavitary gradient --> 06/2016: Severe LVH.  Vigorous EF, 65-70%.?? Gr 1 DD. Mild AS.   Hyperlipidemia    Hypertension    Hypertensive hypertrophic cardiomyopathy: NYHA class II:  Echo: Severe concentric LVH with LV OT gradient; essentially preserved EF with diastolic dysfunction 02/15/2013   Echo 06/2016: Severe Concentric LVH. Vigorous EF 65-70%. ~ Gr I DD.    Mild aortic stenosis by prior echocardiography    Echo 06/2016: Mild AS (Mean Gradient 15 mmHg); has had prior Mod-Severe MR (not seen on current echo)   PAD (peripheral artery disease) (HCC) March 2013   Lower extremity Dopplers: R. SFA 50-60%, R. PTA proximally occluded with distal reconstitution;; L. common iliac ~50%, L. SFA 50-70% stenosis, L. PTA < 50%   Schizophrenia (HCC)    Current Outpatient Medications  Medication Sig Dispense Refill   aspirin  EC 81 MG tablet Take 1  tablet (81 mg total) by mouth daily. Swallow whole. 120 tablet 0   atorvastatin  (LIPITOR) 40 MG tablet Take 1 tablet (40 mg total) by mouth at bedtime. 90 tablet 1   carvedilol  (COREG ) 3.125 MG tablet Take 1 tablet (3.125 mg total) by mouth 2 (two) times daily with a meal. 180 tablet 0   empagliflozin  (JARDIANCE ) 10 MG TABS tablet Take 1 tablet (10 mg total) by mouth daily. 90 tablet 1   ferrous  sulfate 325 (65 FE) MG tablet Take 1 tablet (325 mg total) by mouth daily with breakfast. 100 tablet 0   furosemide  (LASIX ) 40 MG tablet Take 1 tablet (40 mg total) by mouth daily. 30 tablet 1   losartan  (COZAAR ) 25 MG tablet Take 1 tablet (25 mg total) by mouth daily. 90 tablet 0   OLANZapine  (ZYPREXA ) 5 MG tablet Take 5 mg by mouth at bedtime.     potassium chloride  (KLOR-CON  M) 10 MEQ tablet Take 1 tablet (10 mEq total) by mouth daily. 30 tablet 3   No current facility-administered medications for this encounter.   No Known Allergies  Social History   Socioeconomic History   Marital status: Single    Spouse name: Not on file   Number of children: 7   Years of education: Not on file   Highest education level: Not on file  Occupational History   Not on file  Tobacco Use   Smoking status: Some Days    Current packs/day: 0.00    Types: Cigarettes    Last attempt to quit: 05/14/2002    Years since quitting: 21.2   Smokeless tobacco: Never  Vaping Use   Vaping status: Never Used  Substance and Sexual Activity   Alcohol use: No   Drug use: No   Sexual activity: Yes    Birth control/protection: Post-menopausal, None  Other Topics Concern   Not on file  Social History Narrative   Now single mother of 2 with one grandchild. She quit smoking roughly 5 years ago and is not so since. She has also stopped drinking alcohol. She does try get routine exercise walking at least a mile 3-4 days a week.    She lives with her 46 year old mother. 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   Social Drivers of Corporate investment banker Strain: Not on file  Food Insecurity: No Food Insecurity (06/20/2023)   Hunger Vital Sign    Worried About Running Out of Food in the Last Year: Never true    Ran Out of Food in the Last Year: Never true  Transportation Needs: No Transportation Needs (06/20/2023)   PRAPARE - Administrator, Civil Service (Medical): No    Lack of Transportation  (Non-Medical): No  Physical Activity: Not on file  Stress: Not on file  Social Connections: Moderately Isolated (06/13/2023)   Social Connection and Isolation Panel [NHANES]    Frequency of Communication with Friends and Family: More than three times a week    Frequency of Social Gatherings with Friends and Family: More than three times a week    Attends Religious Services: More than 4 times per year    Active Member of [employer] or Organizations: No    Attends Banker Meetings: Never    Marital Status: Divorced  Catering manager Violence: Patient Unable To Answer (06/20/2023)   Humiliation, Afraid, Rape, and Kick questionnaire    Fear of Current or Ex-Partner: Patient unable to answer    Emotionally Abused: Patient unable  to answer    Physically Abused: Patient unable to answer    Sexually Abused: Patient unable to answer   Family History  Problem Relation Age of Onset   Hypertension Mother    Breast cancer Neg Hx    Wt Readings from Last 3 Encounters:  08/02/23 102 kg (224 lb 12.8 oz)  06/19/23 97.5 kg (214 lb 15.2 oz)  05/21/23 90.7 kg (200 lb)   BP 126/82   Pulse 77   Ht 5\' 7"  (1.702 m)   Wt 102 kg (224 lb 12.8 oz)   LMP 05/10/2013   SpO2 98%   BMI 35.21 kg/m   PHYSICAL EXAM: General:  NAD. No resp difficulty, arrived in [facility] HEENT: Normal Neck: Supple. JVP 10 Cor: Regular rate & rhythm. No rubs, gallops, 3-4/6 RUSB, unable to auscultate S2  Lungs: Clear Abdomen: Soft, obese, nontender, nondistended.  Extremities: No cyanosis, clubbing, rash, edema Neuro: Alert & oriented x 3, moves all 4 extremities w/o difficulty. Affect pleasant.  ASSESSMENT & PLAN: 1. Chronic systolic CHF: Long history of CHF.  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 TnI minimally elevated this admission with no trend, suspect demand ischemia.  Echo this admission with EF 25-30%, severe LV dilation, severe concentric LVH, mild RV enlargement and mild RV dysfunction, moderate MR, severe AS  with mean gradient 36 mmHg and AVA 0.95 cm^2, dilated IVC.  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  Severe AS is now complicating the situation.  Management has been difficult due to schizophrenia/poor compliance. NYHA II, she is volume overloaded today, weight up > 10 lbs, REDs 42%. - Increase Lasix  to 80 mg daily, increase KCL to 20 daily. BMET/BNP today. - Continue Coreg  3.125 mg bid.  - Continue Jardiance  10 mg daily. - Continue losartan  25 mg daily (eventual switch to Entresto). - Digoxin  and spironolactone  will be difficult to use with poor compliance/followup.  - I do not think she could make it through a cardiac MRI to assess for cardiac amyloidosis or HCM.  2. Aortic stenosis: Severe low flow/low gradient aortic stenosis.  She is not a surgical candidate.  I am not sure that she would be a TAVR candidate given difficulty with comprehension/compliance (schizophrenia) and PAD, though we could consider this as I think that AS has played a role in her decompensation.  - Refer to Structural Heart team next appt. 3. HTN: Had been noncompliant with meds and followup. Son now managing meds and BP well controlled. - Meds as above 4. 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 Baseline SCr 1.4-1.7  - Continue Jardiance  10 mg daily. 5. Schizophrenia: Lack of insight. Suspect this has caused very poor compliance with medications and followup.  - She is on Zyprexa , managed by PCP. She is A/O x 2 today, (disorientated to place). 6. SDOH: Lives with her son, [name], who helps manage her meds. Seems to have good family support. - Consider bubble packs when med regimen stabilizes.  Follow up in 2 weeks with APP (will need BMET). I personally called her son, [name], and discussed med changes today.  Vernia Good, FNP-BC 08/02/23

## 2023-08-02 NOTE — Progress Notes (Addendum)
    ReDS Vest / Clip - 08/02/23 0800       ReDS Vest / Clip   Station Marker D    Ruler Value 34    ReDS Value Range High volume overload    ReDS Actual Value 42

## 2023-08-20 ENCOUNTER — Encounter (HOSPITAL_COMMUNITY): Payer: Self-pay

## 2023-08-20 ENCOUNTER — Ambulatory Visit (HOSPITAL_COMMUNITY)
Admission: RE | Admit: 2023-08-20 | Discharge: 2023-08-20 | Disposition: A | Discharge status: Home / Self Care | Source: Ambulatory Visit | Attending: Cardiology | Admitting: Cardiology

## 2023-08-20 ENCOUNTER — Ambulatory Visit (HOSPITAL_COMMUNITY): Payer: Self-pay | Admitting: Cardiology

## 2023-08-20 DIAGNOSIS — N183 Chronic kidney disease, stage 3 unspecified: Secondary | ICD-10-CM | POA: Diagnosis not present

## 2023-08-20 DIAGNOSIS — I2510 Atherosclerotic heart disease of native coronary artery without angina pectoris: Secondary | ICD-10-CM | POA: Insufficient documentation

## 2023-08-20 DIAGNOSIS — Z79899 Other long term (current) drug therapy: Secondary | ICD-10-CM | POA: Insufficient documentation

## 2023-08-20 DIAGNOSIS — I130 Hypertensive heart and chronic kidney disease with heart failure and stage 1 through stage 4 chronic kidney disease, or unspecified chronic kidney disease: Secondary | ICD-10-CM | POA: Diagnosis not present

## 2023-08-20 DIAGNOSIS — Z91148 Patient's other noncompliance with medication regimen for other reason: Secondary | ICD-10-CM | POA: Insufficient documentation

## 2023-08-20 DIAGNOSIS — Z8249 Family history of ischemic heart disease and other diseases of the circulatory system: Secondary | ICD-10-CM | POA: Insufficient documentation

## 2023-08-20 DIAGNOSIS — I350 Nonrheumatic aortic (valve) stenosis: Secondary | ICD-10-CM

## 2023-08-20 DIAGNOSIS — F209 Schizophrenia, unspecified: Secondary | ICD-10-CM | POA: Diagnosis not present

## 2023-08-20 DIAGNOSIS — I5022 Chronic systolic (congestive) heart failure: Secondary | ICD-10-CM

## 2023-08-20 LAB — BASIC METABOLIC PANEL WITH GFR
Anion gap: 3 — ABNORMAL LOW (ref 5–15)
BUN: 16 mg/dL (ref 8–23)
CO2: 26 mmol/L (ref 22–32)
Calcium: 9.4 mg/dL (ref 8.9–10.3)
Chloride: 110 mmol/L (ref 98–111)
Creatinine, Ser: 1.29 mg/dL — ABNORMAL HIGH (ref 0.44–1.00)
GFR, Estimated: 44 mL/min — ABNORMAL LOW (ref >60)
Glucose, Bld: 111 mg/dL — ABNORMAL HIGH (ref 70–99)
Potassium: 4.1 mmol/L (ref 3.5–5.1)
Sodium: 139 mmol/L (ref 135–145)

## 2023-08-20 LAB — BRAIN NATRIURETIC PEPTIDE: B Natriuretic Peptide: 617.3 pg/mL — ABNORMAL HIGH (ref 0.0–100.0)

## 2023-08-20 NOTE — Progress Notes (Signed)
 [facility] PROGRESS NOTE   Primary Care: Joaquin Mulberry, MD Primary Cardiologist: Randene Bustard, MD HF Cardiologist: Dr. Mitzie Anda  Reason for Visit: f/u for chronic systolic heart failure and AS   HPI: 73 y.o. female w/ h/o combined systolic and diastolic heart failure w/ previous echos showing severe LVH and GIIIDD, felt to be hypertensive heart disease in the setting of uncontrolled HTN/ poor med compliance vs HCM. She has never had cMRI. She did have prior LHC in 2011 that showed mild nonobstructive CAD. Also w/ mod-severe AS but not referred for valve replacement surgery given concern regarding physiatric issues (schizophrenia) and poor compliance. Other medical issues include HTN, CKD III, HLD and PAD/ thoracoabdominal aortic aneurysms followed by [facility]. Last CT was 7/24 (see below).    Echo 09/2022 showed EF40-45%, severe concentric LVH, normal RV, tricuspid AoV w/ severe calcification and stenosis w/ mean gradient 37 mmHg and near critical AVA of 0.84 cm2. EF had been as low as 25-30% 03/2022.    Admitted 4/25 with a/c HF and uncontrolled hypertension. Diuresed with IV Lasix . Echo showed EF  25-30%, severe LVH, GIIDD, RV normal w/ severely elevated RVSP 66 mmhg, severe AS mean gradient 33.5 mmHg, mild-mod AI, IVC dilated. cMRI not pursued to further evaluate HCM or amyloidosis, due to her psych issues (felt she would not be able to fully cooperative for study). GDMT titrated and she was discharged home, weight 214 lbs.   Seen for f/u 5/29 and was volume overloaded. ReDs elevated at 42%. Lasix  was increased to 80 mg daily.   She returns back for f/u to assess volume status. She lives alone but her son checks on her every other day and also helps her w/ her medications and fills her pill box. He is not present today but participated by phone call. She presents with her pill. It was filled yesterday. She took meds this morning but took PM doses and took pills from the incorrect day.    Her BP is elevated, 186/94. She appears grossly euvolemic on exam and reports ability to do ADLs w/o significant limitation or exertional dyspnea. However ReDs is elevated at 47%. No CP. Denies syncope/ near syncope. Wt 223 lb, up 9 lb from previous d/c wt.    ECG, not performed today   ReDs reading: 47%, abnormal  Labs (4/25): K 4.2, creatinine 1.42  Cardiac Studies - Echo 4/25: EF 25-30%, normal RV, moderate MR, severe AS AVA 0.95 cm, mean gradient 33.3  - Echo 7/24: EF 40-45%, severe LVH, normal RV  Past Medical History:  Diagnosis Date   Chronic back pain    Chronic kidney disease (CKD), stage II (mild)    Class I-II   Coronary artery disease 04/2009   50% stenosis in the perforator of LAD; catheterization was for an abnormal Myoview in January 2000 showing anterior and inferolateral ischemia.   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 > 3 subsequent Echos show improved/normal EF with moderate to severe concentric LVH and diastolic dysfunction with LVOT/intracavitary gradient --> 06/2016: Severe LVH.  Vigorous EF, 65-70%.?? Gr 1 DD. Mild AS.   Hyperlipidemia    Hypertension    Hypertensive hypertrophic cardiomyopathy: NYHA class II:  Echo: Severe concentric LVH with LV OT gradient; essentially preserved EF with diastolic dysfunction 02/15/2013   Echo 06/2016: Severe Concentric LVH. Vigorous EF 65-70%. ~ Gr I DD.  Mild aortic stenosis by prior echocardiography    Echo 06/2016: Mild AS (Mean Gradient 15 mmHg); has had prior Mod-Severe MR (not seen on current echo)   PAD (peripheral artery disease) (HCC) March 2013   Lower extremity Dopplers: R. SFA 50-60%, R. PTA proximally occluded with distal reconstitution;; L. common iliac ~50%, L. SFA 50-70% stenosis, L. PTA < 50%   Schizophrenia (HCC)    Current Outpatient Medications  Medication Sig Dispense Refill    aspirin  EC 81 MG tablet Take 1 tablet (81 mg total) by mouth daily. Swallow whole. 90 tablet 3   atorvastatin  (LIPITOR) 40 MG tablet Take 1 tablet (40 mg total) by mouth at bedtime. 90 tablet 3   carvedilol  (COREG ) 3.125 MG tablet Take 1 tablet (3.125 mg total) by mouth 2 (two) times daily with a meal. 180 tablet 3   empagliflozin  (JARDIANCE ) 10 MG TABS tablet Take 1 tablet (10 mg total) by mouth daily. 90 tablet 3   ferrous sulfate  325 (65 FE) MG tablet Take 1 tablet (325 mg total) by mouth daily with breakfast. 100 tablet 0   furosemide  (LASIX ) 40 MG tablet Take 2 tablets (80 mg total) by mouth daily. 180 tablet 3   losartan  (COZAAR ) 25 MG tablet Take 1 tablet (25 mg total) by mouth daily. 90 tablet 3   OLANZapine  (ZYPREXA ) 5 MG tablet Take 5 mg by mouth at bedtime.     potassium chloride  (KLOR-CON  M) 10 MEQ tablet Take 2 tablets (20 mEq total) by mouth daily. 180 tablet 3   No current facility-administered medications for this encounter.   No Known Allergies  Social History   Socioeconomic History   Marital status: Single    Spouse name: Not on file   Number of children: 7   Years of education: Not on file   Highest education level: Not on file  Occupational History   Not on file  Tobacco Use   Smoking status: Some Days    Current packs/day: 0.00    Types: Cigarettes    Last attempt to quit: 05/14/2002    Years since quitting: 21.2   Smokeless tobacco: Never  Vaping Use   Vaping status: Never Used  Substance and Sexual Activity   Alcohol use: No   Drug use: No   Sexual activity: Yes    Birth control/protection: Post-menopausal, None  Other Topics Concern   Not on file  Social History Narrative   Now single mother of 2 with one grandchild. She quit smoking roughly 5 years ago and is not so since. She has also stopped drinking alcohol. She does try get routine exercise walking at least a mile 3-4 days a week.    She lives with her 67 year old mother. 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   Social Drivers of Corporate investment banker Strain: Not on file  Food Insecurity: No Food Insecurity (06/20/2023)   Hunger Vital Sign    Worried About Running Out of Food in the Last Year: Never true    Ran Out of Food in the Last Year: Never true  Transportation Needs: No Transportation Needs (06/20/2023)   PRAPARE - Administrator, Civil Service (Medical): No    Lack of Transportation (Non-Medical): No  Physical Activity: Not on file  Stress: Not on file  Social Connections: Moderately Isolated (06/13/2023)   Social Connection and Isolation Panel    Frequency of Communication with Friends and Family: More than three times a week    Frequency  of Social Gatherings with Friends and Family: More than three times a week    Attends Religious Services: More than 4 times per year    Active Member of [employer] or Organizations: No    Attends Banker Meetings: Never    Marital Status: Divorced  Catering manager Violence: Patient Unable To Answer (06/20/2023)   Humiliation, Afraid, Rape, and Kick questionnaire    Fear of Current or Ex-Partner: Patient unable to answer    Emotionally Abused: Patient unable to answer    Physically Abused: Patient unable to answer    Sexually Abused: Patient unable to answer   Family History  Problem Relation Age of Onset   Hypertension Mother    Breast cancer Neg Hx    Wt Readings from Last 3 Encounters:  08/02/23 102 kg (224 lb 12.8 oz)  06/19/23 97.5 kg (214 lb 15.2 oz)  05/21/23 90.7 kg (200 lb)   BP (!) 186/94   Pulse 94   LMP 05/10/2013   SpO2 97%   PHYSICAL EXAM: General:  chronically ill appearing. No respiratory difficulty HEENT: normal Neck: supple. JVD not elevated. Carotids 2+ bilat; no bruits. No lymphadenopathy or thyromegaly appreciated. Cor: PMI nondisplaced. Regular rate & rhythm. 3/6 AS murmur  Lungs: clear Abdomen: soft, nontender, nondistended. No hepatosplenomegaly. No bruits or masses. Good  bowel sounds. Extremities: no cyanosis, clubbing, rash, edema   ASSESSMENT & PLAN: 1. Chronic systolic CHF: Long history of CHF.  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 TnI minimally elevated this admission with no trend, suspect demand ischemia.  Echo this admission with EF 25-30%, severe LV dilation, severe concentric LVH, mild RV enlargement and mild RV dysfunction, moderate MR, severe AS with mean gradient 36 mmHg and AVA 0.95 cm^2, dilated IVC.  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  Severe AS is now complicating the situation.  Management has been difficult due to schizophrenia/poor compliance. Her son [name] is now assisting w/ her meds and filling her pill box, but she is still struggling to remember how to correctly use the pill box. She has been taking doses out of the wrong day/ time slot. She has 2 pill slots missing meds from days later this wk (box was just filled yesterday). This makes dose titration difficult. Im hesitant to make any adjustments today. I discussed w/ son that he will not only need to continue to fill pill box but also ensure that she is taking meds correctly each day.  He will try to check on her daily this wk to help improve compliance.  - NYHA Class II, stable. Euvolemic on exam though ReDs reading high. Check BNP today  + BMP. If BNP elevated, we will contact son and instruct him to increase her lasix  regimen  - Continue Lasix  80 mg daily + KCl 20 mEq daily for now  - Continue Coreg  3.125 mg bid.  - Continue Jardiance  10 mg daily. - Continue losartan  25 mg daily (eventual switch to Entresto). - Digoxin  and spironolactone  will be difficult to use with poor compliance/followup.  - I do not think she could make it through a cardiac MRI to assess for cardiac amyloidosis or HCM.  2. Aortic stenosis: Severe low flow/low gradient aortic stenosis.  She is not a surgical candidate.   I am not sure that she would be a TAVR candidate given difficulty with comprehension/compliance (schizophrenia) and PAD, though we could consider this as I think that  AS has played a role in her decompensation.  - If she continues to demonstrate good compliance in regards to clinic f/us  and med adherance, then we can consider referral to Structural Heart team in the future  3. HTN: elevated in setting of poor compliance. Son now helping to manage meds (see discussion above)  - Meds as above 4. 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 Baseline SCr 1.4-1.7  - Continue Jardiance  10 mg daily. - Check BMP today  5. Schizophrenia: Lack of insight. Suspect this has caused very poor compliance with medications and followup.  - She is on Zyprexa , managed by PCP.  6. SDOH:  lives alone but her son helps manage her meds. Seems to have good family support. - Consider bubble packs when med regimen stabilizes.  Discussed w/ son by phon today.   F/u w/ PharmD in 4 wks. Dr. Mitzie Anda in 8 wks.   Ruddy Corral, PA-C  08/20/23

## 2023-08-20 NOTE — Progress Notes (Signed)
 ReDS Vest / Clip - 08/20/23 1600       ReDS Vest / Clip   Station Marker C    Ruler Value 27    ReDS Value Range High volume overload    ReDS Actual Value 47

## 2023-08-20 NOTE — Patient Instructions (Addendum)
 Good to see you today!  Please follow up with our heart failure pharmacist  as scheduled  Labs done today, your results will be available in MyChart, we will contact you for abnormal readings.  Your physician recommends that you schedule a follow-up appointment as scheduled  If you have any questions or concerns before your next appointment please send us  a message through [place] or call our office at [phone].    TO LEAVE A MESSAGE FOR THE NURSE SELECT OPTION 2, PLEASE LEAVE A MESSAGE INCLUDING: YOUR NAME DATE OF BIRTH CALL BACK NUMBER REASON FOR CALL**this is important as we prioritize the call backs  YOU WILL RECEIVE A CALL BACK THE SAME DAY AS LONG AS YOU CALL BEFORE 4:00 PM At the [facility], you and your health needs are our priority. As part of our continuing mission to provide you with exceptional heart care, we have created designated Provider Care Teams. These Care Teams include your primary Cardiologist (physician) and Advanced Practice Providers (APPs- Physician Assistants and Nurse Practitioners) who all work together to provide you with the care you need, when you need it.   You may see any of the following providers on your designated Care Team at your next follow up: Dr Jules Oar Dr Peder Bourdon Dr. Alwin Baars Dr. Arta Lark Amy Marijane Shoulders, NP Ruddy Corral, [place] [facility] [place], [place] Dennise Fitz, NP [place] Lee, NP Shawnee Dellen, NP Luster Salters, PharmD Bevely Brush, PharmD   Please be sure to bring in all your medications bottles to every appointment.    Thank you for choosing [facility]

## 2023-08-20 NOTE — Addendum Note (Signed)
 Encounter addended by: Dewey Fordyce, CMA on: 08/20/2023 4:07 PM  Actions taken: Visit diagnoses modified, Order list changed, Diagnosis association updated, Flowsheet accepted, Clinical Note Signed, Charge Capture section accepted

## 2023-08-21 MED ORDER — POTASSIUM CHLORIDE CRYS ER 10 MEQ PO TBCR: 30.0000 meq | EXTENDED_RELEASE_TABLET | Freq: Every day | ORAL | 5 refills | Status: DC

## 2023-08-21 MED ORDER — FUROSEMIDE 40 MG PO TABS: ORAL_TABLET | ORAL | 3 refills | Status: DC

## 2023-08-28 ENCOUNTER — Other Ambulatory Visit (HOSPITAL_COMMUNITY)

## 2023-09-10 ENCOUNTER — Other Ambulatory Visit: Payer: Self-pay | Admitting: Nephrology

## 2023-09-10 DIAGNOSIS — N1831 Chronic kidney disease, stage 3a: Secondary | ICD-10-CM

## 2023-09-17 NOTE — Progress Notes (Incomplete)
 ***In Progress***    Advanced [facility] Failure Clinic Note   Primary Care: [name], MD Primary Cardiologist: [name], MD HF Cardiologist: [name]  HPI:  Erin Riley 73 y.o. female w/ h/o combined systolic and diastolic heart failure w/ previous echos showing severe LVH and grade III diastolic dysfunction, felt to be hypertensive heart disease in the setting of uncontrolled HTN/ poor med compliance vs HCM. She has never had cMRI. She did have prior LHC in 2011 that showed mild nonobstructive CAD. Also w/ mod-severe AS but not referred for valve replacement surgery given concern regarding physiatric issues (schizophrenia) and poor compliance. Other medical issues include HTN, CKD III, HLD and PAD/ thoracoabdominal aortic aneurysms followed by [facility].   Echo 09/2022 showed EF40-45%, severe concentric LVH, normal RV, tricuspid AoV w/ severe calcification and stenosis w/ mean gradient 37 mmHg and near critical AVA of 0.84 cm2. EF had been as low as 25-30% 03/2022.    Admitted 06/12/2023 with acute HF and uncontrolled hypertension. Diuresed with IV Lasix . ECHO 06/13/2023 showed EF  25-30%, severe LVH, grade II diastolic dysfunction, RV normal w/ severely elevated RVSP 66 mmHg, severe AS mean gradient 33.5 mmHg, mild-mod AI, IVC dilated. cMRI not pursued to further evaluate HCM or amyloidosis, due to her psych issues (felt she would not be able to fully cooperative for study). Weight 214 lbs at discharge. Her HF regimen at discharge was carvedilol  3.125 mg BID, losartan  25 mg daily, Jardiance  10 mg daily, furosemide  40 mg daily. Notably during hospitalization, amlodipine , hydralazine , and isosorbide  were discontinued.   On 08/02/2023 she was seen for follow up. Overall felt fine. She was in a wheel chair due to R knee pain/swelling. She lives with her son who helps manage her medications. She was able to complete ADLs and cook for herself without dyspnea. Denied palpitations, abnormal bleeding, CP,  dizziness, edema, or PND/Orthopnea. Appetite was ok. Did not weigh at home, because she did not have a scale. Reported taking all medications. No tobacco, drug or ETOH use. She was found to be volume overloaded during visit. ReDs elevated at 42%. Furosemide  was increased to 80 mg daily, and potassium increased to 20 mEq daily.   She returned for follow up with APP on 08/20/2023 to assess volume status. She reported living alone but her son checks on her every other day and also helps her w/ her medications and fills her pill box. He was not present but participated by phone call. She presented with her pill. It was filled the day prior. She took medications that morning but took PM doses and took pills from the incorrect day. Her BP was elevated, 186/94. She appeared grossly euvolemic on exam and reported ability to do ADLs without significant limitation or exertional dyspnea. ReDs was elevated at 47%. Denied CP. Denied syncope/ near syncope. Wt 223 lb, up 9 lb from previous discharge weight. Labs at this visit were stable, but BNP was elevated. Son was called and told to increase furosemide  to 80 mg in the morning and 40 mg in the evening, potassium was increased to 30 mEq daily. She was instructed to return for follow up labs a week later but missed appointment.    Today he returns to HF clinic for pharmacist medication titration.   Overall feeling ***. Dizziness, lightheadedness, fatigue:  Chest pain or palpitations:  How is your breathing?: *** SOB: Able to complete all ADLs. Activity level ***  Weight at home pounds. Takes furosemide /torsemide/bumex *** mg *** daily.  LEE PND/Orthopnea  Appetite *** Low-salt diet:   Physical Exam Cost/affordability of meds   HF Medications: Carvedilol  3.125 mg BID Losartan  25 mg daily Jardiance  10 mg daily Furosemide  40 mg daily *** was told to increase to 80 mg in the morning and 40 mg in the evening - last filled for 30 ds (40 mg) in April   Has  the patient been experiencing any side effects to the medications prescribed?  {YES NO:22349}  Does the patient have any problems obtaining medications due to transportation or finances?   {YES NO:22349}  Understanding of regimen: {excellent/good/fair/poor:19665} Understanding of indications: {excellent/good/fair/poor:19665} Potential of compliance: {excellent/good/fair/poor:19665} Patient understands to avoid NSAIDs. Patient understands to avoid decongestants.    Pertinent Lab Values: 08/20/2023: Serum creatinine 1.29, BUN 16, Potassium 4.1, Sodium 139, BNP 617.3  Vital Signs: Weight: *** lbs  Blood pressure: *** mmHg Heart rate: *** bpm  Assessment/Plan: 1. Chronic systolic CHF:  Long history of CHF.   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 TnI minimally elevated this admission with no trend, suspect demand ischemia. ECHO 06/13/2023 with EF 25-30%, severe LV dilation, severe concentric LVH, mild RV enlargement and mild RV dysfunction, moderate MR, severe AS with mean gradient 36 mmHg and AVA 0.95 cm^2, dilated IVC.  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  Severe AS is now complicating the situation.  Management has been difficult due to schizophrenia/poor compliance. Her son [name] is now assisting w/ her meds and filling her pill box, but she is still struggling to remember how to correctly use the pill box. She has been taking doses out of the wrong day/ time slot. She has 2 pill slots missing meds from days later this wk (box was just filled yesterday). This makes dose titration difficult. Im hesitant to make any adjustments today. I discussed w/ son that he will not only need to continue to fill pill box but also ensure that she is taking meds correctly each day.  He will try to check on her daily this wk to help improve compliance.  - NYHA Class ***. Volume status *** Check BNP today  + BMP. If  BNP elevated, we will contact son and instruct him to increase her lasix  regimen *** - Continue furosemide  80 mg in the morning and 40 mg in the evening + potassium 20 mEq daily - Continue carvedilol  3.125 mg BID.  - Continue losartan  25 mg daily  (eventual switch to Entresto). - Continue Jardiance  10 mg daily. - Digoxin  and spironolactone  will be difficult to use with poor compliance/followup.  - I do not think she could make it through a cardiac MRI to assess for cardiac amyloidosis or HCM. ***  2. Aortic stenosis:  - Severe low flow/low gradient aortic stenosis.  - She is not a surgical candidate.  - Per APP, not sure that she would be a TAVR candidate given difficulty with comprehension/compliance (schizophrenia) and PAD, though could consider this as AS has likely played a role in her decompensation. If she continues to demonstrate good compliance in regards to clinic f/us  and med adherance, then could consider referral to Structural Heart team in the future   3. HTN:  - Elevated in setting of poor compliance. *** - Meds as above  4. CKD stage 3:  - Suspect hypertensive CMP.  - Baseline SCr 1.4-1.7  - Continue Jardiance  10 mg daily.   5. Schizophrenia:  - Lack of insight.  -  Suspect this has caused very poor compliance with medications and followup.  - She is on Zyprexa , managed by PCP.   6. SDOH:   - Lives alone but her son helps manage her meds.  - Seems to have good family support. *** - Consider bubble packs when med regimen stabilizes.  Follow up with MD in 1-2 months.    Tinnie Redman, PharmD, BCPS, BCCP, CPP Heart Failure Clinic Pharmacist [phone]

## 2023-09-18 ENCOUNTER — Inpatient Hospital Stay (HOSPITAL_COMMUNITY)
Admission: RE | Admit: 2023-09-18 | Discharge: 2023-09-18 | Disposition: A | Discharge status: Home / Self Care | Source: Ambulatory Visit

## 2023-09-27 ENCOUNTER — Other Ambulatory Visit: Payer: Self-pay | Admitting: Family Medicine

## 2023-10-19 NOTE — Progress Notes (Signed)
 Pt was advised that she should go to ER or urgent care for elevated BP.

## 2023-11-02 ENCOUNTER — Other Ambulatory Visit (HOSPITAL_COMMUNITY): Payer: Self-pay

## 2023-11-12 ENCOUNTER — Telehealth (HOSPITAL_COMMUNITY): Payer: Self-pay | Admitting: Cardiology

## 2023-11-12 NOTE — Telephone Encounter (Signed)
 Called to confirm/remind patient of their appointment at the [facility] on 11/12/23.   Appointment:   [] Confirmed  [x] Left mess   [] No answer/No voice mail  [] VM Full/unable to leave message  [] Phone not in service  Patient reminded to bring all medications and/or complete list.  Confirmed patient has transportation. Gave directions, instructed to utilize valet parking.

## 2023-11-13 ENCOUNTER — Ambulatory Visit (HOSPITAL_COMMUNITY): Payer: Self-pay | Admitting: Cardiology

## 2023-11-13 ENCOUNTER — Ambulatory Visit (HOSPITAL_COMMUNITY)
Admission: RE | Admit: 2023-11-13 | Discharge: 2023-11-13 | Disposition: A | Discharge status: Home / Self Care | Source: Ambulatory Visit | Attending: Cardiology | Admitting: Cardiology

## 2023-11-13 ENCOUNTER — Encounter (HOSPITAL_COMMUNITY): Payer: Self-pay | Admitting: Cardiology

## 2023-11-13 VITALS — BP 150/90 | HR 98 | Ht 67.0 in | Wt 220.6 lb

## 2023-11-13 DIAGNOSIS — I5022 Chronic systolic (congestive) heart failure: Secondary | ICD-10-CM | POA: Insufficient documentation

## 2023-11-13 DIAGNOSIS — Z7984 Long term (current) use of oral hypoglycemic drugs: Secondary | ICD-10-CM | POA: Diagnosis not present

## 2023-11-13 DIAGNOSIS — N183 Chronic kidney disease, stage 3 unspecified: Secondary | ICD-10-CM | POA: Insufficient documentation

## 2023-11-13 DIAGNOSIS — I2510 Atherosclerotic heart disease of native coronary artery without angina pectoris: Secondary | ICD-10-CM | POA: Insufficient documentation

## 2023-11-13 DIAGNOSIS — F22 Delusional disorders: Secondary | ICD-10-CM | POA: Diagnosis not present

## 2023-11-13 DIAGNOSIS — Z79899 Other long term (current) drug therapy: Secondary | ICD-10-CM | POA: Insufficient documentation

## 2023-11-13 DIAGNOSIS — E8770 Fluid overload, unspecified: Secondary | ICD-10-CM | POA: Diagnosis not present

## 2023-11-13 DIAGNOSIS — F209 Schizophrenia, unspecified: Secondary | ICD-10-CM | POA: Diagnosis not present

## 2023-11-13 DIAGNOSIS — I130 Hypertensive heart and chronic kidney disease with heart failure and stage 1 through stage 4 chronic kidney disease, or unspecified chronic kidney disease: Secondary | ICD-10-CM | POA: Insufficient documentation

## 2023-11-13 DIAGNOSIS — Z87891 Personal history of nicotine dependence: Secondary | ICD-10-CM | POA: Diagnosis not present

## 2023-11-13 DIAGNOSIS — I080 Rheumatic disorders of both mitral and aortic valves: Secondary | ICD-10-CM | POA: Diagnosis not present

## 2023-11-13 DIAGNOSIS — Z91148 Patient's other noncompliance with medication regimen for other reason: Secondary | ICD-10-CM | POA: Diagnosis not present

## 2023-11-13 LAB — BASIC METABOLIC PANEL WITH GFR
Anion gap: 10 (ref 5–15)
BUN: 24 mg/dL — ABNORMAL HIGH (ref 8–23)
CO2: 23 mmol/L (ref 22–32)
Calcium: 9.3 mg/dL (ref 8.9–10.3)
Chloride: 103 mmol/L (ref 98–111)
Creatinine, Ser: 1.65 mg/dL — ABNORMAL HIGH (ref 0.44–1.00)
GFR, Estimated: 33 mL/min — ABNORMAL LOW (ref >60)
Glucose, Bld: 103 mg/dL — ABNORMAL HIGH (ref 70–99)
Potassium: 4.4 mmol/L (ref 3.5–5.1)
Sodium: 136 mmol/L (ref 135–145)

## 2023-11-13 LAB — BRAIN NATRIURETIC PEPTIDE: B Natriuretic Peptide: 1625.5 pg/mL — ABNORMAL HIGH (ref 0.0–100.0)

## 2023-11-13 NOTE — Patient Instructions (Signed)
 There has been no changes to your medications.  Labs done today, your results will be available in MyChart, we will contact you for abnormal readings.  Your physician recommends that you schedule a follow-up appointment in as scheduled.  If you have any questions or concerns before your next appointment please send [place] a message through [place] or call our office at [phone].    TO LEAVE A MESSAGE FOR THE NURSE SELECT OPTION 2, PLEASE LEAVE A MESSAGE INCLUDING: YOUR NAME DATE OF BIRTH CALL BACK NUMBER REASON FOR CALL**this is important as we prioritize the call backs  YOU WILL RECEIVE A CALL BACK THE SAME DAY AS LONG AS YOU CALL BEFORE 4:00 PM  At the [facility], you and your health needs are our priority. As part of our continuing mission to provide you with exceptional heart care, we have created designated Provider Care Teams. These Care Teams include your primary Cardiologist (physician) and Advanced Practice Providers (APPs- Physician Assistants and Nurse Practitioners) who all work together to provide you with the care you need, when you need it.   You may see any of the following providers on your designated Care Team at your next follow up: Dr Arvilla Meres Dr Marca Ancona Dr. Dorthula Nettles Dr. Clearnce Hasten Amy Filbert Schilder, NP Robbie Lis, [place] [facility] [place], [place] Brynda Peon, NP [place] Lee, NP Clarisa Kindred, NP Karle Plumber, PharmD Enos Fling, PharmD   Please be sure to bring in all your medications bottles to every appointment.    Thank you for choosing [facility]

## 2023-11-14 NOTE — Progress Notes (Signed)
 [facility] PROGRESS NOTE   Primary Care: [name], MD Primary Cardiologist: [name], MD HF Cardiologist: [name]  Chief complaint: CHF  HPI: 73 y.o. female w/ history of CHF w/ previous echos showing severe LVH and GIIIDD, felt to be hypertensive heart disease in the setting of uncontrolled HTN/ poor med compliance vs HCM. She has never had cMRI. She did have prior LHC in 2011 that showed mild nonobstructive CAD. Also w/ mod-severe AS but not referred for valve replacement surgery given concern regarding physiatric issues (schizophrenia) and poor compliance. Other medical issues include HTN, CKD III, HLD and PAD/ thoracoabdominal aortic aneurysms followed by [facility]. Last CT was 7/24 (see below).    Echo 09/2022 showed EF 40-45%, severe concentric LVH, normal RV, tricuspid AoV w/ severe calcification and stenosis w/ mean gradient 37 mmHg and near critical AVA of 0.84 cm2. EF had been as low as 25-30% 03/2022.    Admitted 4/25 with acute CHF and uncontrolled hypertension. Diuresed with IV Lasix . Echo showed EF 25-30%, severe LVH, GIIDD, RV normal w/ severely elevated RVSP 66 mmhg, severe AS mean gradient 36 mmHg, mild-mod AI, IVC dilated. cMRI not pursued to further evaluate HCM or amyloidosis due to her psych issues (felt she would not be able to fully cooperative for study). GDMT titrated and she was discharged home, weight 214 lbs.   She comes in alone today.  She is not taking any of her medications.  She says that she had the wrong medications and needs her prenatal pills.  She has paranoid ideation and flight of ideas.  She lives alone but her son looks in on her.  She denies dyspnea but comes to the office today in a wheelchair. No chest pain.  No lightheadedness/syncope.  She is no longer smoking.  She does not believe that she has aortic stenosis or CHF, says we have the wrong patient.  I spoke at length with her son.  He has been putting her meds in a pillbox but  she is not taking them.  BP elevated today, but as above, not taking her meds.   ECG (personally reviewed): NSR, LVH with repolarization abnormality, PVC  Labs (4/25): K 4.2, creatinine 1.42 Labs (6/25): K 4.1, creatinine 1.29, BNP 617  PMH: 1. Schizophrenia 2. HTN 3. CKD stage 3 4. CAD: LHC 2011 with minimal CAD.  5. Aortic stenosis: Severe on 4/25 echo.  6. Chronic systolic CHF: Suspect nonischemic cardiomyopathy.  - Echo 7/24: EF 40-45%, severe LVH, normal RV, severe AS with mean gradient 37 mmHg, AVA 0.84 cm^2.  - Echo 4/25: EF 25-30%, normal RV systolic function with moderate RVE, moderate MR, severe AS AVA 0.95 cm, mean gradient 36 mmHg.  7. Hyperlipidemia 8. PAD  ROS: All systems reviewed and negative except as per HPI.   Current Outpatient Medications  Medication Sig Dispense Refill   aspirin  EC 81 MG tablet Take 1 tablet (81 mg total) by mouth daily. Swallow whole. (Patient not taking: Reported on 11/13/2023) 90 tablet 3   atorvastatin  (LIPITOR) 40 MG tablet Take 1 tablet (40 mg total) by mouth at bedtime. (Patient not taking: Reported on 11/13/2023) 90 tablet 3   carvedilol  (COREG ) 3.125 MG tablet Take 1 tablet (3.125 mg total) by mouth 2 (two) times daily with a meal. (Patient not taking: Reported on 11/13/2023) 180 tablet 3   empagliflozin  (JARDIANCE ) 10 MG TABS tablet Take 1 tablet (10 mg total) by mouth daily. (Patient not taking: Reported on 11/13/2023) 90  tablet 3   ferrous sulfate  325 (65 FE) MG tablet Take 1 tablet (325 mg total) by mouth daily with breakfast. (Patient not taking: Reported on 11/13/2023) 100 tablet 0   furosemide  (LASIX ) 40 MG tablet Take 80mg  in the morning, and 40mg  in the evening (Patient not taking: Reported on 11/13/2023) 180 tablet 3   losartan  (COZAAR ) 25 MG tablet Take 1 tablet (25 mg total) by mouth daily. (Patient not taking: Reported on 11/13/2023) 90 tablet 3   OLANZapine  (ZYPREXA ) 5 MG tablet Take 5 mg by mouth at bedtime. (Patient not taking: Reported  on 11/13/2023)     potassium chloride  (KLOR-CON  M) 10 MEQ tablet Take 3 tablets (30 mEq total) by mouth daily. (Patient not taking: Reported on 11/13/2023) 90 tablet 5   No current facility-administered medications for this encounter.   No Known Allergies  Social History   Socioeconomic History   Marital status: Single    Spouse name: Not on file   Number of children: 7   Years of education: Not on file   Highest education level: Not on file  Occupational History   Not on file  Tobacco Use   Smoking status: Some Days    Current packs/day: 0.00    Types: Cigarettes    Last attempt to quit: 05/14/2002    Years since quitting: 21.5   Smokeless tobacco: Never  Vaping Use   Vaping status: Never Used  Substance and Sexual Activity   Alcohol use: No   Drug use: No   Sexual activity: Yes    Birth control/protection: Post-menopausal, None  Other Topics Concern   Not on file  Social History Narrative   Now single mother of 2 with one grandchild. She quit smoking roughly 5 years ago and is not so since. She has also stopped drinking alcohol. She does try get routine exercise walking at least a mile 3-4 days a week.    She lives with her 62 year old mother. 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   Social Drivers of Corporate investment banker Strain: Not on file  Food Insecurity: Patient Declined (10/19/2023)   Hunger Vital Sign    Worried About Running Out of Food in the Last Year: Patient declined    Ran Out of Food in the Last Year: Patient declined  Transportation Needs: Patient Declined (10/19/2023)   PRAPARE - Administrator, Civil Service (Medical): Patient declined    Lack of Transportation (Non-Medical): Patient declined  Physical Activity: Not on file  Stress: Not on file  Social Connections: Moderately Isolated (06/13/2023)   Social Connection and Isolation Panel    Frequency of Communication with Friends and Family: More than three times a week    Frequency of  Social Gatherings with Friends and Family: More than three times a week    Attends Religious Services: More than 4 times per year    Active Member of [employer] or Organizations: No    Attends Banker Meetings: Never    Marital Status: Divorced  Catering manager Violence: Patient Declined (10/19/2023)   Humiliation, Afraid, Rape, and Kick questionnaire    Fear of Current or Ex-Partner: Patient declined    Emotionally Abused: Patient declined    Physically Abused: Patient declined    Sexually Abused: Patient declined   Family History  Problem Relation Age of Onset   Hypertension Mother    Breast cancer Neg Hx    Wt Readings from Last 3 Encounters:  11/13/23 100.1 kg (220  lb 9.6 oz)  08/02/23 102 kg (224 lb 12.8 oz)  06/19/23 97.5 kg (214 lb 15.2 oz)   BP (!) 150/90   Pulse 98   Ht 5' 7 (1.702 m)   Wt 100.1 kg (220 lb 9.6 oz)   LMP 05/10/2013   SpO2 97%   BMI 34.55 kg/m   PHYSICAL EXAM: General: NAD Neck: JVP 8-9 cm, no thyromegaly or thyroid  nodule.  Lungs: Distant breath sounds.  CV: Nondisplaced PMI.  Heart regular S1/S2, no S3/S4, 3/6 SEM RUSB with clear S2.  No peripheral edema.  No carotid bruit.  Unable to palpate pedal pulses.  Abdomen: Soft, nontender, no hepatosplenomegaly, no distention.  Skin: Intact without lesions or rashes.  Neurologic: Alert and oriented x 3.  Psych: Normal affect. Extremities: No clubbing or cyanosis.  HEENT: Normal.   ASSESSMENT & PLAN: 1. Chronic systolic CHF: Long history of CHF.  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 Echo in 4/25 with EF 25-30%, severe LV dilation, severe concentric LVH, mild RV enlargement and mild RV dysfunction, moderate MR, severe AS with mean gradient 36 mmHg and AVA 0.95 cm^2, dilated IVC.  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  Severe AS is now complicating the situation.  Management has been  difficult due to schizophrenia/poor compliance. Her son [name] tries to help w/ her meds and fills her pill box, but she has minimal insight into her illness and is not taking her medications.  She lives alone.  NYHA II per report but comes in in a wheelchair.  She is at least mildly volume overloaded on exam in the setting of not taking her Lasix .   - I tried to talk to her about CHF and aortic stenosis, but she says that she does not have these diagnoses.  No insight into disease and paranoia.  I called her son and had a long discussion with him.  He is going to try to come by daily to help convince her to take her meds.  - Restart Lasix  80 qam/40 qpm.  BMET/BNP today and in 10 days.  - Restart Coreg  3.125 mg bid.  - Restart Jardiance  10 mg daily. - Restart losartan  25 mg daily (eventual switch to Entresto). - I do not think she could make it through a cardiac MRI to assess for cardiac amyloidosis or HCM.  - Our ability to manage her CHF is going to be significantly impaired unless we can get her schizophrenia under better control.  2. Aortic stenosis: Severe low flow/low gradient aortic stenosis.  She is not a surgical candidate.  I am not sure that she will ever be a TAVR candidate given difficulty with comprehension/compliance (schizophrenia) and PAD.  - Before referral to structural heart clinic, she is going to need to get her schizophrenia under control.  I asked her son to try to get her in with her psychiatrist.  3. HTN: elevated in setting of poor compliance with meds. See discussion above.   - Restart meds as above 4. CKD stage 3: Last creatinine around 1.3.  - Restart Jardiance  10 mg daily. - Check BMP today  5. Schizophrenia: Lack of insight and paranoia. Suspect this has caused very poor compliance with medications and followup.  - She needs evaluation by psychiatry.  Currently, she does not have the insight/understanding to proceed with TAVR.   Extensive discussion with son by phone  today.  I would like her to followup in  a week or so with APP once she is taking her meds, and she will need to see psychiatry.   I spent 41 minutes reviewing data and LVAD parameters, interviewing patient, and organizing the orders/followup.   Ezra Shuck,  11/14/23

## 2023-11-21 ENCOUNTER — Encounter (HOSPITAL_COMMUNITY): Payer: Self-pay | Admitting: *Deleted

## 2023-11-30 NOTE — Progress Notes (Incomplete)
 [facility] PROGRESS NOTE   Primary Care: [name], MD Primary Cardiologist: [name], MD HF Cardiologist: [name]  Chief complaint: CHF  HPI: 73 y.o. female w/ history of CHF w/ previous echos showing severe LVH and GIIIDD, felt to be hypertensive heart disease in the setting of uncontrolled HTN/ poor med compliance vs HCM. She has never had cMRI. She did have prior LHC in 2011 that showed mild nonobstructive CAD. Also w/ mod-severe AS but not referred for valve replacement surgery given concern regarding physiatric issues (schizophrenia) and poor compliance. Other medical issues include HTN, CKD III, HLD and PAD/ thoracoabdominal aortic aneurysms followed by [facility]. Last CT was 7/24 (see below).    Echo 09/2022 showed EF 40-45%, severe concentric LVH, normal RV, tricuspid AoV w/ severe calcification and stenosis w/ mean gradient 37 mmHg and near critical AVA of 0.84 cm2. EF had been as low as 25-30% 03/2022.    Admitted 4/25 with acute CHF and uncontrolled hypertension. Diuresed with IV Lasix . Echo showed EF 25-30%, severe LVH, GIIDD, RV normal w/ severely elevated RVSP 66 mmhg, severe AS mean gradient 36 mmHg, mild-mod AI, IVC dilated. cMRI not pursued to further evaluate HCM or amyloidosis due to her psych issues (felt she would not be able to fully cooperative for study). GDMT titrated and she was discharged home, weight 214 lbs.   She comes in alone today.  She is not taking any of her medications.  She says that she had the wrong medications and needs her prenatal pills.  She has paranoid ideation and flight of ideas.  She lives alone but her son looks in on her.  She denies dyspnea but comes to the office today in a wheelchair. No chest pain.  No lightheadedness/syncope.  She is no longer smoking.  She does not believe that she has aortic stenosis or CHF, says we have the wrong patient.  I spoke at length with her son.  He has been putting her meds in a pillbox but  she is not taking them.  BP elevated today, but as above, not taking her meds.   ECG (personally reviewed): NSR, LVH with repolarization abnormality, PVC  Labs (4/25): K 4.2, creatinine 1.42 Labs (6/25): K 4.1, creatinine 1.29, BNP 617  PMH: 1. Schizophrenia 2. HTN 3. CKD stage 3 4. CAD: LHC 2011 with minimal CAD.  5. Aortic stenosis: Severe on 4/25 echo.  6. Chronic systolic CHF: Suspect nonischemic cardiomyopathy.  - Echo 7/24: EF 40-45%, severe LVH, normal RV, severe AS with mean gradient 37 mmHg, AVA 0.84 cm^2.  - Echo 4/25: EF 25-30%, normal RV systolic function with moderate RVE, moderate MR, severe AS AVA 0.95 cm, mean gradient 36 mmHg.  7. Hyperlipidemia 8. PAD  ROS: All systems reviewed and negative except as per HPI.   Current Outpatient Medications  Medication Sig Dispense Refill   aspirin  EC 81 MG tablet Take 1 tablet (81 mg total) by mouth daily. Swallow whole. (Patient not taking: Reported on 11/13/2023) 90 tablet 3   atorvastatin  (LIPITOR) 40 MG tablet Take 1 tablet (40 mg total) by mouth at bedtime. (Patient not taking: Reported on 11/13/2023) 90 tablet 3   carvedilol  (COREG ) 3.125 MG tablet Take 1 tablet (3.125 mg total) by mouth 2 (two) times daily with a meal. (Patient not taking: Reported on 11/13/2023) 180 tablet 3   empagliflozin  (JARDIANCE ) 10 MG TABS tablet Take 1 tablet (10 mg total) by mouth daily. (Patient not taking: Reported on 11/13/2023) 90  tablet 3   ferrous sulfate  325 (65 FE) MG tablet Take 1 tablet (325 mg total) by mouth daily with breakfast. (Patient not taking: Reported on 11/13/2023) 100 tablet 0   furosemide  (LASIX ) 40 MG tablet Take 80mg  in the morning, and 40mg  in the evening (Patient not taking: Reported on 11/13/2023) 180 tablet 3   losartan  (COZAAR ) 25 MG tablet Take 1 tablet (25 mg total) by mouth daily. (Patient not taking: Reported on 11/13/2023) 90 tablet 3   OLANZapine  (ZYPREXA ) 5 MG tablet Take 5 mg by mouth at bedtime. (Patient not taking: Reported  on 11/13/2023)     potassium chloride  (KLOR-CON  M) 10 MEQ tablet Take 3 tablets (30 mEq total) by mouth daily. (Patient not taking: Reported on 11/13/2023) 90 tablet 5   No current facility-administered medications for this visit.   No Known Allergies  Social History   Socioeconomic History   Marital status: Single    Spouse name: Not on file   Number of children: 7   Years of education: Not on file   Highest education level: Not on file  Occupational History   Not on file  Tobacco Use   Smoking status: Some Days    Current packs/day: 0.00    Types: Cigarettes    Last attempt to quit: 05/14/2002    Years since quitting: 21.5   Smokeless tobacco: Never  Vaping Use   Vaping status: Never Used  Substance and Sexual Activity   Alcohol use: No   Drug use: No   Sexual activity: Yes    Birth control/protection: Post-menopausal, None  Other Topics Concern   Not on file  Social History Narrative   Now single mother of 2 with one grandchild. She quit smoking roughly 5 years ago and is not so since. She has also stopped drinking alcohol. She does try get routine exercise walking at least a mile 3-4 days a week.    She lives with her 44 year old mother. 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   Social Drivers of Corporate investment banker Strain: Not on file  Food Insecurity: Patient Declined (10/19/2023)   Hunger Vital Sign    Worried About Running Out of Food in the Last Year: Patient declined    Ran Out of Food in the Last Year: Patient declined  Transportation Needs: Patient Declined (10/19/2023)   PRAPARE - Administrator, Civil Service (Medical): Patient declined    Lack of Transportation (Non-Medical): Patient declined  Physical Activity: Not on file  Stress: Not on file  Social Connections: Moderately Isolated (06/13/2023)   Social Connection and Isolation Panel    Frequency of Communication with Friends and Family: More than three times a week    Frequency of Social  Gatherings with Friends and Family: More than three times a week    Attends Religious Services: More than 4 times per year    Active Member of [employer] or Organizations: No    Attends Banker Meetings: Never    Marital Status: Divorced  Catering manager Violence: Patient Declined (10/19/2023)   Humiliation, Afraid, Rape, and Kick questionnaire    Fear of Current or Ex-Partner: Patient declined    Emotionally Abused: Patient declined    Physically Abused: Patient declined    Sexually Abused: Patient declined   Family History  Problem Relation Age of Onset   Hypertension Mother    Breast cancer Neg Hx    Wt Readings from Last 3 Encounters:  11/13/23 100.1 kg (220  lb 9.6 oz)  08/02/23 102 kg (224 lb 12.8 oz)  06/19/23 97.5 kg (214 lb 15.2 oz)   LMP 05/10/2013   PHYSICAL EXAM: General: NAD Neck: JVP 8-9 cm, no thyromegaly or thyroid  nodule.  Lungs: Distant breath sounds.  CV: Nondisplaced PMI.  Heart regular S1/S2, no S3/S4, 3/6 SEM RUSB with clear S2.  No peripheral edema.  No carotid bruit.  Unable to palpate pedal pulses.  Abdomen: Soft, nontender, no hepatosplenomegaly, no distention.  Skin: Intact without lesions or rashes.  Neurologic: Alert and oriented x 3.  Psych: Normal affect. Extremities: No clubbing or cyanosis.  HEENT: Normal.   ASSESSMENT & PLAN: 1. Chronic systolic CHF: Long history of CHF.  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 Echo in 4/25 with EF 25-30%, severe LV dilation, severe concentric LVH, mild RV enlargement and mild RV dysfunction, moderate MR, severe AS with mean gradient 36 mmHg and AVA 0.95 cm^2, dilated IVC.  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  Severe AS is now complicating the situation.  Management has been difficult due to schizophrenia/poor compliance. Her son [name] tries to help w/ her meds and fills her pill box, but she has  minimal insight into her illness and is not taking her medications.  She lives alone.  NYHA II per report but comes in in a wheelchair.  She is at least mildly volume overloaded on exam in the setting of not taking her Lasix .   - I tried to talk to her about CHF and aortic stenosis, but she says that she does not have these diagnoses.  No insight into disease and paranoia.  I called her son and had a long discussion with him.  He is going to try to come by daily to help convince her to take her meds.  - Restart Lasix  80 qam/40 qpm.  BMET/BNP today and in 10 days.  - Restart Coreg  3.125 mg bid.  - Restart Jardiance  10 mg daily. - Restart losartan  25 mg daily (eventual switch to Entresto). - I do not think she could make it through a cardiac MRI to assess for cardiac amyloidosis or HCM.  - Our ability to manage her CHF is going to be significantly impaired unless we can get her schizophrenia under better control.  2. Aortic stenosis: Severe low flow/low gradient aortic stenosis.  She is not a surgical candidate.  I am not sure that she will ever be a TAVR candidate given difficulty with comprehension/compliance (schizophrenia) and PAD.  - Before referral to structural heart clinic, she is going to need to get her schizophrenia under control.  I asked her son to try to get her in with her psychiatrist.  3. HTN: elevated in setting of poor compliance with meds. See discussion above.   - Restart meds as above 4. CKD stage 3: Last creatinine around 1.3.  - Restart Jardiance  10 mg daily. - Check BMP today  5. Schizophrenia: Lack of insight and paranoia. Suspect this has caused very poor compliance with medications and followup.  - She needs evaluation by psychiatry.  Currently, she does not have the insight/understanding to proceed with TAVR.   Extensive discussion with son by phone today.  I would like her to followup in a week or so with APP once she is taking her meds, and she will need to see  psychiatry.   I spent 41 minutes reviewing data and LVAD parameters, interviewing patient, and organizing  the orders/followup.   Harlene [name] [place],  11/30/23

## 2023-12-03 ENCOUNTER — Telehealth (HOSPITAL_COMMUNITY): Payer: Self-pay

## 2023-12-03 NOTE — Telephone Encounter (Signed)
 Called to confirm/remind patient of their appointment [facility] on 12/04/[facility].   Appointment:   [] Confirmed  [x] Left mess   [] No answer/No voice mail  [] VM Full/unable to leave message  [] Phone not in service  Patient reminded to bring all medications and/or complete list.

## 2023-12-04 ENCOUNTER — Inpatient Hospital Stay (HOSPITAL_COMMUNITY)
Admission: RE | Admit: 2023-12-04 | Discharge: 2023-12-04 | Disposition: A | Discharge status: Home / Self Care | Source: Ambulatory Visit

## 2023-12-12 NOTE — Progress Notes (Signed)
 [facility] PROGRESS NOTE   Primary Care: [name], MD Primary Cardiologist: [name], MD HF Cardiologist: [name]  Chief complaint: CHF  HPI: 73 y.o. female w/ history of CHF w/ previous echos showing severe LVH and GIIIDD, felt to be hypertensive heart disease in the setting of uncontrolled HTN/ poor med compliance vs HCM. She has never had cMRI. She did have prior LHC in 2011 that showed mild nonobstructive CAD. Also w/ mod-severe AS but not referred for valve replacement surgery given concern regarding physiatric issues (schizophrenia) and poor compliance. Other medical issues include HTN, CKD III, HLD and PAD/ thoracoabdominal aortic aneurysms followed by [facility]. Last CT was 7/24 (see below).    Echo 09/2022 showed EF 40-45%, severe concentric LVH, normal RV, tricuspid AoV w/ severe calcification and stenosis w/ mean gradient 37 mmHg and near critical AVA of 0.84 cm2. EF had been as low as 25-30% 03/2022.    Admitted 4/25 with acute CHF and uncontrolled hypertension. Diuresed with IV Lasix . Echo showed EF 25-30%, severe LVH, GIIDD, RV normal w/ severely elevated RVSP 66 mmhg, severe AS mean gradient 36 mmHg, mild-mod AI, IVC dilated. cMRI not pursued to further evaluate HCM or amyloidosis due to her psych issues (felt she would not be able to fully cooperative for study). GDMT titrated and she was discharged home, weight 214 lbs.   She returns today for heart failure follow up with her son. Overall feeling okay, denies all HF symptoms. NYHA II by report, although likely worse, deconditioning contributing, arrived by wheelchair. Denies chest pain, dyspnea, fatigue, orthopnea, palpitations, and dizziness. Able to perform ADLs. Appetite okay. Son has been setting up all her cardiac medications, compliance has improved. She has not seen by psychiatry yet. She has not been on any of her medications for schizophrenia in some time. Remains distrustful with her diagnosis, however  son helps with orientation. Mentioned that she recently had a baby during her visit.   PMH: 1. Schizophrenia 2. HTN 3. CKD stage 3 4. CAD: LHC 2011 with minimal CAD.  5. Aortic stenosis: Severe on 4/25 echo.  6. Chronic systolic CHF: Suspect nonischemic cardiomyopathy.  - Echo 7/24: EF 40-45%, severe LVH, normal RV, severe AS with mean gradient 37 mmHg, AVA 0.84 cm^2.  - Echo 4/25: EF 25-30%, normal RV systolic function with moderate RVE, moderate MR, severe AS AVA 0.95 cm, mean gradient 36 mmHg.  7. Hyperlipidemia 8. PAD  ROS: All systems reviewed and negative except as per HPI.   Current Outpatient Medications  Medication Sig Dispense Refill   aspirin  EC 81 MG tablet Take 1 tablet (81 mg total) by mouth daily. Swallow whole. 90 tablet 3   atorvastatin  (LIPITOR) 40 MG tablet Take 1 tablet (40 mg total) by mouth at bedtime. 90 tablet 3   carvedilol  (COREG ) 3.125 MG tablet Take 1 tablet (3.125 mg total) by mouth 2 (two) times daily with a meal. 180 tablet 3   empagliflozin  (JARDIANCE ) 10 MG TABS tablet Take 1 tablet (10 mg total) by mouth daily. 90 tablet 3   ferrous sulfate  325 (65 FE) MG tablet Take 1 tablet (325 mg total) by mouth daily with breakfast. 100 tablet 0   furosemide  (LASIX ) 40 MG tablet Take 80mg  in the morning, and 40mg  in the evening 180 tablet 3   OLANZapine  (ZYPREXA ) 5 MG tablet Take 5 mg by mouth at bedtime.     potassium chloride  (KLOR-CON  M) 10 MEQ tablet Take 3 tablets (30 mEq total) by  mouth daily. 90 tablet 5   sacubitril-valsartan (ENTRESTO) 49-51 MG Take 1 tablet by mouth 2 (two) times daily. 60 tablet 5   No current facility-administered medications for this encounter.   No Known Allergies  Social History   Socioeconomic History   Marital status: Single    Spouse name: Not on file   Number of children: 7   Years of education: Not on file   Highest education level: Not on file  Occupational History   Not on file  Tobacco Use   Smoking status: Some  Days    Current packs/day: 0.00    Types: Cigarettes    Last attempt to quit: 05/14/2002    Years since quitting: 21.6   Smokeless tobacco: Never  Vaping Use   Vaping status: Never Used  Substance and Sexual Activity   Alcohol use: No   Drug use: No   Sexual activity: Yes    Birth control/protection: Post-menopausal, None  Other Topics Concern   Not on file  Social History Narrative   Now single mother of 2 with one grandchild. She quit smoking roughly 5 years ago and is not so since. She has also stopped drinking alcohol. She does try get routine exercise walking at least a mile 3-4 days a week.    She lives with her 40 year old mother. 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   Social Drivers of Corporate investment banker Strain: Not on file  Food Insecurity: Patient Declined (10/19/2023)   Hunger Vital Sign    Worried About Running Out of Food in the Last Year: Patient declined    Ran Out of Food in the Last Year: Patient declined  Transportation Needs: Patient Declined (10/19/2023)   PRAPARE - Administrator, Civil Service (Medical): Patient declined    Lack of Transportation (Non-Medical): Patient declined  Physical Activity: Not on file  Stress: Not on file  Social Connections: Moderately Isolated (06/13/2023)   Social Connection and Isolation Panel    Frequency of Communication with Friends and Family: More than three times a week    Frequency of Social Gatherings with Friends and Family: More than three times a week    Attends Religious Services: More than 4 times per year    Active Member of [employer] or Organizations: No    Attends Banker Meetings: Never    Marital Status: Divorced  Catering manager Violence: Patient Declined (10/19/2023)   Humiliation, Afraid, Rape, and Kick questionnaire    Fear of Current or Ex-Partner: Patient declined    Emotionally Abused: Patient declined    Physically Abused: Patient declined    Sexually Abused: Patient  declined   Family History  Problem Relation Age of Onset   Hypertension Mother    Breast cancer Neg Hx    Wt Readings from Last 3 Encounters:  12/17/23 98.4 kg (217 lb)  11/13/23 100.1 kg (220 lb 9.6 oz)  08/02/23 102 kg (224 lb 12.8 oz)   BP (!) 140/82   Pulse (!) 58   Ht 5' 7 (1.702 m)   Wt 98.4 kg (217 lb)   LMP 05/10/2013   SpO2 98%   BMI 33.99 kg/m   PHYSICAL EXAM: General: Distrustful appearing. No distress on RA Cardiac: JVP flat. S1 and S2 present. 4/6 systolic murmur RUSB. Extremities: Warm and dry.  Trace BLE edema.  Neuro: Alert and oriented to self. Paranoid.  ASSESSMENT & PLAN: 1. Chronic systolic CHF: Long history of CHF.  Cath in 2011  with nonobstructive CAD. Echo in 4/25 with EF 25-30%, severe LV dilation, severe concentric LVH, mild RV enlargement and mild RV dysfunction, moderate MR, severe AS with mean gradient 36 mmHg and AVA 0.95 cm^2, dilated IVC.  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  Severe AS is now complicating the situation.  Management has been difficult due to schizophrenia/poor compliance. Her son [name] tries to help w/ her meds and fills her pill box, but she has minimal insight into her illness and is intermittently not taking her medications. This has improved since last OPV.  - NYHA II per report but comes in in a wheelchair.  She is mildly overloaded on exam.  - continue Lasix  80 qam/40 qpm.  BMET/BNP today and in 10 days.  - continue Coreg  3.125 mg bid, HR is on the lower side. Stop coreg  if continues with bradycardia - restart Jardiance  10 mg daily, has not picked up due to pharmacy stock (call pharmacy, will have tomorrow) - stop losartan ; switch to entresto 49/51 mg bid, need aggressive BP control - doubt she could make it through a cardiac MRI to assess for cardiac amyloidosis or HCM.  - Our ability to manage her CHF is going to be  significantly impaired unless we can get her schizophrenia under better control. Son is working on getting her back in with psych, re-referred today. 2. Aortic stenosis: Severe low flow/low gradient aortic stenosis.  She is not a surgical candidate.  Possibly a TAVR candidate, although large barrier is her comprehension/compliance (schizophrenia) and PAD.  - Refer to [facility], for eval, son to participate in discussion as at the moment patient does not have medical decision making capacity. - Would ideally get back on medication for her schizophrenia prior to intervention 3. HTN: mildly elevated today. Medication changes as above. 4. CKD stage 3: Last creatinine around 1.65. - restart Jardiance  10 mg daily - Check BMP today  5. Schizophrenia: Lack of insight and paranoia. Suspect this has caused very poor compliance with medications and followup.  - She needs evaluation by psychiatry.   Follow up in 3 months with Dr. McLean  [place] Anesha Hackert, NP 12/17/23

## 2023-12-14 ENCOUNTER — Telehealth (HOSPITAL_COMMUNITY): Payer: Self-pay

## 2023-12-14 NOTE — Telephone Encounter (Signed)
 Called to confirm/remind patient of their appointment at the [facility] on 12/17/23 2:30.   Appointment:   [x] Confirmed  [] Left mess   [] No answer/No voice mail  [] VM Full/unable to leave message  [] Phone not in service  Patient reminded to bring all medications and/or complete list.  Confirmed patient has transportation. Gave directions, instructed to utilize valet parking.

## 2023-12-17 ENCOUNTER — Other Ambulatory Visit (HOSPITAL_COMMUNITY): Payer: Self-pay

## 2023-12-17 ENCOUNTER — Ambulatory Visit (HOSPITAL_COMMUNITY)
Admission: RE | Admit: 2023-12-17 | Discharge: 2023-12-17 | Disposition: A | Discharge status: Home / Self Care | Source: Ambulatory Visit | Attending: Cardiology | Admitting: Cardiology

## 2023-12-17 ENCOUNTER — Ambulatory Visit (HOSPITAL_COMMUNITY): Payer: Self-pay | Admitting: Cardiology

## 2023-12-17 ENCOUNTER — Encounter (HOSPITAL_COMMUNITY): Payer: Self-pay

## 2023-12-17 VITALS — BP 140/82 | HR 58 | Ht 67.0 in | Wt 217.0 lb

## 2023-12-17 DIAGNOSIS — F201 Disorganized schizophrenia: Secondary | ICD-10-CM

## 2023-12-17 DIAGNOSIS — I130 Hypertensive heart and chronic kidney disease with heart failure and stage 1 through stage 4 chronic kidney disease, or unspecified chronic kidney disease: Secondary | ICD-10-CM | POA: Diagnosis not present

## 2023-12-17 DIAGNOSIS — Z87891 Personal history of nicotine dependence: Secondary | ICD-10-CM | POA: Diagnosis not present

## 2023-12-17 DIAGNOSIS — Z79899 Other long term (current) drug therapy: Secondary | ICD-10-CM | POA: Diagnosis not present

## 2023-12-17 DIAGNOSIS — Z91148 Patient's other noncompliance with medication regimen for other reason: Secondary | ICD-10-CM | POA: Insufficient documentation

## 2023-12-17 DIAGNOSIS — Z7984 Long term (current) use of oral hypoglycemic drugs: Secondary | ICD-10-CM | POA: Diagnosis not present

## 2023-12-17 DIAGNOSIS — N183 Chronic kidney disease, stage 3 unspecified: Secondary | ICD-10-CM | POA: Insufficient documentation

## 2023-12-17 DIAGNOSIS — I350 Nonrheumatic aortic (valve) stenosis: Secondary | ICD-10-CM | POA: Insufficient documentation

## 2023-12-17 DIAGNOSIS — I10 Essential (primary) hypertension: Secondary | ICD-10-CM

## 2023-12-17 DIAGNOSIS — I2510 Atherosclerotic heart disease of native coronary artery without angina pectoris: Secondary | ICD-10-CM | POA: Insufficient documentation

## 2023-12-17 DIAGNOSIS — I5022 Chronic systolic (congestive) heart failure: Secondary | ICD-10-CM | POA: Insufficient documentation

## 2023-12-17 DIAGNOSIS — I716 Thoracoabdominal aortic aneurysm, without rupture, unspecified: Secondary | ICD-10-CM | POA: Insufficient documentation

## 2023-12-17 DIAGNOSIS — I878 Other specified disorders of veins: Secondary | ICD-10-CM | POA: Insufficient documentation

## 2023-12-17 DIAGNOSIS — F209 Schizophrenia, unspecified: Secondary | ICD-10-CM | POA: Diagnosis not present

## 2023-12-17 DIAGNOSIS — N1832 Chronic kidney disease, stage 3b: Secondary | ICD-10-CM

## 2023-12-17 LAB — BASIC METABOLIC PANEL WITH GFR
Anion gap: 10 (ref 5–15)
BUN: 20 mg/dL (ref 8–23)
CO2: 23 mmol/L (ref 22–32)
Calcium: 9.3 mg/dL (ref 8.9–10.3)
Chloride: 105 mmol/L (ref 98–111)
Creatinine, Ser: 1.41 mg/dL — ABNORMAL HIGH (ref 0.44–1.00)
GFR, Estimated: 40 mL/min — ABNORMAL LOW (ref >60)
Glucose, Bld: 104 mg/dL — ABNORMAL HIGH (ref 70–99)
Potassium: 3.8 mmol/L (ref 3.5–5.1)
Sodium: 138 mmol/L (ref 135–145)

## 2023-12-17 MED ORDER — SACUBITRIL-VALSARTAN 49-51 MG PO TABS: 1.0000 | ORAL_TABLET | Freq: Two times a day (BID) | ORAL | 5 refills | Status: DC

## 2023-12-17 NOTE — Patient Instructions (Signed)
 Medication Changes:  STOP LOSARTAN    START ENTRESTO 49/51MG  TWICE DAILY   Lab Work:  Labs done today, your results will be available in MyChart, we will contact you for abnormal readings.  Referrals:  YOU HAVE BEEN REFERRED TO [facility] THEY WILL REACH OUT TO YOU OR CALL TO ARRANGE THIS. PLEASE CALL US  WITH ANY CONCERNS   YOU HAVE BEEN REFERRED TO PSYCHIATRY THEY WILL REACH OUT TO YOU OR CALL TO ARRANGE THIS. PLEASE CALL US  WITH ANY CONCERNS   Follow-Up in: 3 MONTHS WITH [name] PLEASE CALL OUR OFFICE AROUND THE END OF NOVEMBER TO GET SCHEDULED FOR YOUR APPOINTMENT. PHONE NUMBER IS [phone] OPTION 2 R  At the [facility], you and your health needs are our priority. We have a designated team specialized in the treatment of Heart Failure. This Care Team includes your primary Heart Failure Specialized Cardiologist (physician), Advanced Practice Providers (APPs- Physician Assistants and Nurse Practitioners), and Pharmacist who all work together to provide you with the care you need, when you need it.   You may see any of the following providers on your designated Care Team at your next follow up:  Dr. Toribio Fuel Dr. Ezra ROLAN Dr. Ria Commander Dr. Odis Brownie Greig Mosses, NP Caffie Shed, [place] [facility], [place] Beckey Coe, NP [place] Lee, NP Tinnie Redman, PharmD   Please be sure to bring in all your medications bottles to every appointment.   Need to Contact Us :  If you have any questions or concerns before your next appointment please send us  a message through [place]aden or call our office at [phone].    TO LEAVE A MESSAGE FOR THE NURSE SELECT OPTION 2, PLEASE LEAVE A MESSAGE INCLUDING: YOUR NAME DATE OF BIRTH CALL BACK NUMBER REASON FOR CALL**this is important as we prioritize the call backs  YOU WILL RECEIVE A CALL BACK THE SAME DAY AS LONG AS YOU CALL BEFORE 4:00 PM

## 2023-12-19 ENCOUNTER — Emergency Department (HOSPITAL_COMMUNITY)
Admission: EM | Admit: 2023-12-19 | Discharge: 2023-12-19 | Disposition: A | Discharge status: Other Acute Inpatient Hospital | Attending: Emergency Medicine | Admitting: Emergency Medicine

## 2023-12-19 ENCOUNTER — Inpatient Hospital Stay
Admission: RE | Admit: 2023-12-19 | Discharge: 2023-12-28 | DRG: 885 | Disposition: A | Discharge status: Home / Self Care | Attending: Psychiatry | Admitting: Psychiatry

## 2023-12-19 ENCOUNTER — Other Ambulatory Visit: Payer: Self-pay

## 2023-12-19 ENCOUNTER — Encounter (HOSPITAL_COMMUNITY): Payer: Self-pay

## 2023-12-19 DIAGNOSIS — M549 Dorsalgia, unspecified: Secondary | ICD-10-CM | POA: Diagnosis present

## 2023-12-19 DIAGNOSIS — I129 Hypertensive chronic kidney disease with stage 1 through stage 4 chronic kidney disease, or unspecified chronic kidney disease: Secondary | ICD-10-CM | POA: Diagnosis present

## 2023-12-19 DIAGNOSIS — I420 Dilated cardiomyopathy: Secondary | ICD-10-CM | POA: Diagnosis present

## 2023-12-19 DIAGNOSIS — Z91148 Patient's other noncompliance with medication regimen for other reason: Secondary | ICD-10-CM | POA: Diagnosis not present

## 2023-12-19 DIAGNOSIS — Z7982 Long term (current) use of aspirin: Secondary | ICD-10-CM | POA: Diagnosis not present

## 2023-12-19 DIAGNOSIS — G8929 Other chronic pain: Secondary | ICD-10-CM | POA: Diagnosis present

## 2023-12-19 DIAGNOSIS — Z7984 Long term (current) use of oral hypoglycemic drugs: Secondary | ICD-10-CM

## 2023-12-19 DIAGNOSIS — F200 Paranoid schizophrenia: Principal | ICD-10-CM | POA: Diagnosis present

## 2023-12-19 DIAGNOSIS — Z79899 Other long term (current) drug therapy: Secondary | ICD-10-CM

## 2023-12-19 DIAGNOSIS — E785 Hyperlipidemia, unspecified: Secondary | ICD-10-CM | POA: Diagnosis present

## 2023-12-19 DIAGNOSIS — I2510 Atherosclerotic heart disease of native coronary artery without angina pectoris: Secondary | ICD-10-CM | POA: Diagnosis present

## 2023-12-19 DIAGNOSIS — N182 Chronic kidney disease, stage 2 (mild): Secondary | ICD-10-CM | POA: Insufficient documentation

## 2023-12-19 DIAGNOSIS — Z8249 Family history of ischemic heart disease and other diseases of the circulatory system: Secondary | ICD-10-CM

## 2023-12-19 DIAGNOSIS — I422 Other hypertrophic cardiomyopathy: Secondary | ICD-10-CM | POA: Diagnosis present

## 2023-12-19 DIAGNOSIS — I739 Peripheral vascular disease, unspecified: Secondary | ICD-10-CM | POA: Diagnosis present

## 2023-12-19 DIAGNOSIS — I130 Hypertensive heart and chronic kidney disease with heart failure and stage 1 through stage 4 chronic kidney disease, or unspecified chronic kidney disease: Secondary | ICD-10-CM | POA: Diagnosis not present

## 2023-12-19 DIAGNOSIS — Z87891 Personal history of nicotine dependence: Secondary | ICD-10-CM

## 2023-12-19 DIAGNOSIS — R443 Hallucinations, unspecified: Secondary | ICD-10-CM

## 2023-12-19 DIAGNOSIS — I509 Heart failure, unspecified: Secondary | ICD-10-CM | POA: Insufficient documentation

## 2023-12-19 LAB — COMPREHENSIVE METABOLIC PANEL WITH GFR
ALT: 13 U/L (ref 0–44)
AST: 18 U/L (ref 15–41)
Albumin: 3.4 g/dL — ABNORMAL LOW (ref 3.5–5.0)
Alkaline Phosphatase: 92 U/L (ref 38–126)
Anion gap: 12 (ref 5–15)
BUN: 25 mg/dL — ABNORMAL HIGH (ref 8–23)
CO2: 23 mmol/L (ref 22–32)
Calcium: 9.4 mg/dL (ref 8.9–10.3)
Chloride: 101 mmol/L (ref 98–111)
Creatinine, Ser: 1.71 mg/dL — ABNORMAL HIGH (ref 0.44–1.00)
GFR, Estimated: 31 mL/min — ABNORMAL LOW (ref >60)
Glucose, Bld: 117 mg/dL — ABNORMAL HIGH (ref 70–99)
Potassium: 3.7 mmol/L (ref 3.5–5.1)
Sodium: 136 mmol/L (ref 135–145)
Total Bilirubin: 1.5 mg/dL — ABNORMAL HIGH (ref 0.0–1.2)
Total Protein: 7.8 g/dL (ref 6.5–8.1)

## 2023-12-19 LAB — CBC
HCT: 34.4 % — ABNORMAL LOW (ref 36.0–46.0)
Hemoglobin: 10.8 g/dL — ABNORMAL LOW (ref 12.0–15.0)
MCH: 28.5 pg (ref 26.0–34.0)
MCHC: 31.4 g/dL (ref 30.0–36.0)
MCV: 90.8 fL (ref 80.0–100.0)
Platelets: 271 K/uL (ref 150–400)
RBC: 3.79 MIL/uL — ABNORMAL LOW (ref 3.87–5.11)
RDW: 14.6 % (ref 11.5–15.5)
WBC: 5.8 K/uL (ref 4.0–10.5)
nRBC: 0 % (ref 0.0–0.2)

## 2023-12-19 LAB — RAPID URINE DRUG SCREEN, HOSP PERFORMED
Amphetamines: NOT DETECTED
Barbiturates: NOT DETECTED
Benzodiazepines: NOT DETECTED
Cocaine: NOT DETECTED
Opiates: NOT DETECTED
Tetrahydrocannabinol: NOT DETECTED

## 2023-12-19 LAB — ETHANOL: Alcohol, Ethyl (B): <15 mg/dL (ref <15)

## 2023-12-19 LAB — SARS CORONAVIRUS 2 BY RT PCR: SARS Coronavirus 2 by RT PCR: NEGATIVE

## 2023-12-19 MED ORDER — ALUM & MAG HYDROXIDE-SIMETH 200-200-20 MG/5ML PO SUSP: 30.0000 mL | ORAL | Status: DC | PRN

## 2023-12-19 MED ORDER — OLANZAPINE 5 MG PO TABS
5.0000 mg | ORAL_TABLET | Freq: Every day | ORAL | Status: DC
  Administered 2023-12-20 – 2023-12-24 (×5): 5 mg via ORAL
  Filled 2023-12-19 (×5): qty 1

## 2023-12-19 MED ORDER — MAGNESIUM HYDROXIDE 400 MG/5ML PO SUSP: 30.0000 mL | Freq: Every day | ORAL | Status: DC | PRN

## 2023-12-19 MED ORDER — METHOCARBAMOL 500 MG PO TABS
500.0000 mg | ORAL_TABLET | Freq: Three times a day (TID) | ORAL | Status: DC | PRN
  Administered 2023-12-19: 500 mg via ORAL
  Filled 2023-12-19: qty 1

## 2023-12-19 MED ORDER — OLANZAPINE 5 MG PO TABS: 5.0000 mg | ORAL_TABLET | Freq: Every day | ORAL | Status: DC

## 2023-12-19 MED ORDER — OLANZAPINE 5 MG PO TABS
5.0000 mg | ORAL_TABLET | Freq: Once | ORAL | Status: AC
  Administered 2023-12-19: 5 mg via ORAL
  Filled 2023-12-19: qty 1

## 2023-12-19 MED ORDER — OLANZAPINE 10 MG IM SOLR: 5.0000 mg | Freq: Three times a day (TID) | INTRAMUSCULAR | Status: DC | PRN

## 2023-12-19 MED ORDER — ACETAMINOPHEN 325 MG PO TABS
650.0000 mg | ORAL_TABLET | Freq: Four times a day (QID) | ORAL | Status: DC | PRN
  Administered 2023-12-26 – 2023-12-28 (×3): 650 mg via ORAL
  Filled 2023-12-19 (×3): qty 2

## 2023-12-19 MED ORDER — HYDROXYZINE HCL 25 MG PO TABS
25.0000 mg | ORAL_TABLET | Freq: Three times a day (TID) | ORAL | Status: DC | PRN
  Administered 2023-12-26 – 2023-12-27 (×2): 25 mg via ORAL
  Filled 2023-12-19 (×2): qty 1

## 2023-12-19 MED ORDER — OLANZAPINE 5 MG PO TBDP
5.0000 mg | ORAL_TABLET | Freq: Three times a day (TID) | ORAL | Status: DC | PRN
  Administered 2023-12-27: 5 mg via ORAL
  Filled 2023-12-19: qty 1

## 2023-12-19 NOTE — ED Provider Notes (Signed)
 [place] EMERGENCY DEPARTMENT AT [facility] Provider Note   CSN: [id number] Arrival date & time: 12/19/23  9048     Patient presents with: Hallucinations (/)   Erin Riley is a 73 y.o. female.  HPI Patient is a 73 year old female presenting the ED today for concerns for hallucinations, believing that aliens are climbing trees in her backyard and have been implanting objects in her legs causing them to swell.  Noted that they have also been stealing food from her.  She also believes that she is 3 months pregnant.  She is concerned for her leg swelling bilaterally which have been ongoing for many years.  No other complaints otherwise.  Previous medical history of heart failure, PAD, varicose veins, schizophrenia, CAD, HTN, aortic stenosis, CKD.  She states that she has been taking her medications.  Spoke with son on the phone who says that she has not been taking her medications and that she called him talking out of her mind.  Notes that she lives at home alone  Denies fever, headache, vision changes, chest pain, shortness of breath, abdominal pain, nausea, vomiting, diarrhea, dysuria.     Prior to Admission medications   Medication Sig Start Date End Date Taking? Authorizing Provider  aspirin  EC 81 MG tablet Take 1 tablet (81 mg total) by mouth daily. Swallow whole. 08/02/23   Milford, Harlene [name], FNP  atorvastatin  (LIPITOR) 40 MG tablet Take 1 tablet (40 mg total) by mouth at bedtime. 08/02/23   Glena Harlene [name], FNP  carvedilol  (COREG ) 3.125 MG tablet Take 1 tablet (3.125 mg total) by mouth 2 (two) times daily with a meal. 08/02/23   Milford, Harlene [name], FNP  empagliflozin  (JARDIANCE ) 10 MG TABS tablet Take 1 tablet (10 mg total) by mouth daily. 08/02/23   Glena Harlene [name], FNP  ferrous sulfate  325 (65 FE) MG tablet Take 1 tablet (325 mg total) by mouth daily with breakfast. 09/14/22   Singh, Prashant K, MD  furosemide  (LASIX ) 40 MG tablet Take 80mg  in the morning, and  40mg  in the evening 08/21/23   Marcine Catalan M, PA-C  OLANZapine  (ZYPREXA ) 5 MG tablet Take 5 mg by mouth at bedtime. 05/23/23   [provider]  potassium chloride  (KLOR-CON  M) 10 MEQ tablet Take 3 tablets (30 mEq total) by mouth daily. 08/21/23   Marcine Catalan M, PA-C  sacubitril-valsartan (ENTRESTO) 49-51 MG Take 1 tablet by mouth 2 (two) times daily. 12/17/23   Lee, [place], NP    Allergies: Patient has no known allergies.    Review of Systems  Cardiovascular:  Positive for leg swelling.  Psychiatric/Behavioral:  Positive for hallucinations.   All other systems reviewed and are negative.   Updated Vital Signs BP (!) 151/98   Pulse 78   Temp (!) 96.2 F (35.7 C) (Axillary)   Resp 18   Ht 5' 7 (1.702 m)   Wt 96.2 kg   LMP 05/10/2013   SpO2 100%   BMI 33.20 kg/m   Physical Exam Vitals and nursing note reviewed.  Constitutional:      General: She is not in acute distress.    Appearance: Normal appearance. She is not ill-appearing or diaphoretic.  HENT:     Head: Normocephalic and atraumatic.  Eyes:     General: No scleral icterus.       Right eye: No discharge.        Left eye: No discharge.     Extraocular Movements: Extraocular movements intact.  Conjunctiva/sclera: Conjunctivae normal.  Cardiovascular:     Rate and Rhythm: Normal rate and regular rhythm.     Pulses: Normal pulses.     Heart sounds: Murmur heard.     No friction rub. No gallop.  Pulmonary:     Effort: Pulmonary effort is normal. No respiratory distress.     Breath sounds: No stridor. No wheezing, rhonchi or rales.  Chest:     Chest wall: No tenderness.  Abdominal:     General: Abdomen is flat. There is no distension.     Palpations: Abdomen is soft.     Tenderness: There is no abdominal tenderness. There is no right CVA tenderness, left CVA tenderness, guarding or rebound.  Musculoskeletal:        General: No swelling, deformity or signs of injury.     Cervical back: Normal  range of motion. No rigidity.     Right lower leg: Edema present.     Left lower leg: Edema present.     Comments: Mild edema noted bilaterally, DP pulse 1+ bilaterally.  Skin:    General: Skin is warm and dry.     Capillary Refill: Capillary refill takes less than 2 seconds.     Findings: No bruising, erythema or lesion.  Neurological:     General: No focal deficit present.     Mental Status: She is alert and oriented to person, place, and time. Mental status is at baseline.     Sensory: No sensory deficit.     Motor: No weakness.  Psychiatric:     Comments: Noted to be talking about aliens implanting objects in her.       (all labs ordered are listed, but only abnormal results are displayed) Labs Reviewed  COMPREHENSIVE METABOLIC PANEL WITH GFR - Abnormal; Notable for the following components:      Result Value   Glucose, Bld 117 (*)    BUN 25 (*)    Creatinine, Ser 1.71 (*)    Albumin  3.4 (*)    Total Bilirubin 1.5 (*)    GFR, Estimated 31 (*)    All other components within normal limits  CBC - Abnormal; Notable for the following components:   RBC 3.79 (*)    Hemoglobin 10.8 (*)    HCT 34.4 (*)    All other components within normal limits  ETHANOL  RAPID URINE DRUG SCREEN, HOSP PERFORMED    EKG: None  Radiology: No results found.  Procedures   Medications Ordered in the ED - No data to display  Medical Decision Making Amount and/or Complexity of Data Reviewed Labs: ordered.   This patient is a 73 year old female who presents to the ED for concern of hallucinations and lower leg swelling, swelling is  chronic.  This is she experiencing both auditory and visual hallucinations of aliens.  Called son who report that she has not been taking her medications as prescribed.  On physical exam, patient is in no acute distress, afebrile, alert and orient x 4, speaking in full sentences, nontachypneic, nontachycardic.  Notably does have mild lower leg edema that appears  to be chronic and not worse than baseline.  LCTAB, RRR, chronic murmur noted.  Unremarkable exam otherwise.  Patient continues to talk about aliens implanting things in her.  Low suspicion for DVT, exacerbation of heart failure, ACS or PE at this time.  Will have her continue to be evaluated by TTS with disposition pending their evaluation.  Differential diagnoses prior to evaluation: The emergent  differential diagnosis includes, but is not limited to, metabolic disturbance, medication poor compliance, schizophrenia, thyroid  abnormality, drug use, heart failure, DVT, ischemic limb,. This is not an exhaustive differential.   Past Medical History / Co-morbidities / Social History: HTN, PAD, HLD, varicose veins, CKD, schizophrenia, chronic back pain, CAD  Additional history: Chart reviewed. Pertinent results include:   Seen by cardiology cardiology on 12/17/2023 for LVH GIIIDD.  Noted to have severe aortic stenosis with management difficult due to schizophrenia and poor compliance.  Restarted Jardiance .  Possible TAVR candidate with follow-up with structural heart for referral.   Lab Tests/Imaging studies: I personally interpreted labs/imaging and the pertinent results include:   CBC near baseline CMP shows near baseline creatinine likely elevated creatinine and GFR secondary to dehydration with patient notably not eating and drinking as much. X-ray panel negative Ethanol level unremarkable   Medications:  I have reviewed the patients home medicines and have made adjustments as needed.  Critical Interventions: None  Social Determinants of Health: Lives at home alone with son visiting regularly  Disposition: After consideration of the diagnostic results and the patients response to treatment, I feel that the patient would benefit from evaluation and disposition pending TTS evaluation.   Final diagnoses:  Hallucinations    ED Discharge Orders     None          Roma Bierlein  S, PA-C 12/19/23 1537    Patsey Lot, MD 12/20/23 0700

## 2023-12-19 NOTE — ED Notes (Signed)
 PT belongings placed in locker 3, wanded by security

## 2023-12-19 NOTE — ED Notes (Signed)
 Pts belongings given to safe transport driver.

## 2023-12-19 NOTE — ED Notes (Signed)
 Wanded by security
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
## 2023-12-19 NOTE — Progress Notes (Signed)
 Pt was accepted to [facility] 12/19/2023 Bed Assignment 28  Address: [address] [place], [place], [place] [address]  -[facility] Fax: [phone]  Pt meets inpatient criteria per : Elveria Batter NP  Attending Physician will be Dr. Allyn Donnelly COME  Report can be called to: [phone]  Pt can arrive after  Care Team notified: Cherylynn Ernst RN, Elveria Batter NP   [place] Kajal Scalici MSW, [facility] 12/19/2023 3:36 PM

## 2023-12-19 NOTE — ED Triage Notes (Signed)
 RN assessing if patient is oriented. [facility] that [facility] and that she is here because she has ankle swelling and is 3 months pregnant. She is A&Ox3. Alert to self, place and time.

## 2023-12-19 NOTE — ED Notes (Signed)
 Patient changed to paper scrubs  Belongings collected  1 shirt white 1 pants green 1 flip flops white 1 bra white 1 pocketbook brown

## 2023-12-19 NOTE — ED Provider Notes (Signed)
 Patient has been accepted by [facility] psych unit.  [facility] accepting.  EMTALA completed.   Conard Alvira, MD 12/19/23 2041

## 2023-12-19 NOTE — ED Notes (Signed)
 RN has patient belongings
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
## 2023-12-20 ENCOUNTER — Encounter: Payer: Self-pay | Admitting: Psychiatry

## 2023-12-20 ENCOUNTER — Other Ambulatory Visit: Payer: Self-pay

## 2023-12-20 DIAGNOSIS — F200 Paranoid schizophrenia: Secondary | ICD-10-CM

## 2023-12-20 NOTE — [facility] Counselor (Signed)
 CSW attempted to complete assessment.  Patient was initially engaged and participate, however, pt became upset at childhood questions and asked this writer to leave the room.  Second attempt is needed.   Sherryle Margo, MSW, LCSW 12/20/2023 10:03 AM

## 2023-12-20 NOTE — Group Note (Signed)
 Date:  12/20/2023 Time:  10:54 AM  Group Topic/Focus:  Exercise Group:  The focus of the group is to encourage patients to watch a video which demonstrated different exercises to perform while in a seated position.  Patients were given an explanation on how each movements could benefit their physical and mental health in the long run.    Participation Level:  Did Not Attend   Erin Riley [name] [name] 12/20/2023, 10:54 AM

## 2023-12-20 NOTE — Group Note (Signed)
 [facility] LCSW Group Therapy Note   Group Date: 12/20/2023 Start Time: 1315 End Time: 1400   Type of Therapy/Topic:  Group Therapy:  Emotion Regulation  Participation Level:  Did Not Attend   Mood:  Description of Group:    The purpose of this group is to assist patients in learning to regulate negative emotions and experience positive emotions. Patients will be guided to discuss ways in which they have been vulnerable to their negative emotions. These vulnerabilities will be juxtaposed with experiences of positive emotions or situations, and patients challenged to use positive emotions to combat negative ones. Special emphasis will be placed on coping with negative emotions in conflict situations, and patients will process healthy conflict resolution skills.  Therapeutic Goals: Patient will identify two positive emotions or experiences to reflect on in order to balance out negative emotions:  Patient will label two or more emotions that they find the most difficult to experience:  Patient will be able to demonstrate positive conflict resolution skills through discussion or role plays:   Summary of Patient Progress:   X    Therapeutic Modalities:   Cognitive Behavioral Therapy Feelings Identification Dialectical Behavioral Therapy   Erin Riley, LCSWA
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
## 2023-12-20 NOTE — Progress Notes (Signed)
 Mood/Behavior:  Irritable.  Verbally aggressive.     Psych assessment:  UTA   Refused assessment questions of SW, Dr. JINNY and this Clinical research associate. Agitated. Physically and verbally aggressive.    Interaction / Group attendance:  Interacted with peers.  Did not attend groups.   Medication/ PRNs:  None  Pain: Denies  15 min checks in place for safety.

## 2023-12-20 NOTE — Progress Notes (Signed)
 Patient admitted to unit alert x2. Denies SI, HI, AVH. Irritable, uncooperative during admission, verbally aggressive at times and not wanting to participate. Skin and contraband search completed and witnessed by [facility], Charity fundraiser. No skin issues noted, no contraband found. Admitted due to hallucinations. Patient paranoid throughout admission process. Fluid and nutrition offered. Patient currently in bed resting eyes closed in no distress.

## 2023-12-20 NOTE — Progress Notes (Signed)
   12/20/23 2100  Psych Admission Type (Psych Patients Only)  Admission Status Voluntary  Psychosocial Assessment  Patient Complaints Anxiety  Eye Contact Fair  Facial Expression Fixed smile;Anxious  Affect Anxious  Speech Logical/coherent  Interaction Assertive  Motor Activity Slow  Appearance/Hygiene In scrubs;Disheveled  Behavior Characteristics Cooperative  Mood Anxious;Pleasant  Thought Process  Coherency Disorganized  Content Delusions  Delusions Paranoid  Perception Hallucinations  Hallucination Auditory  Judgment Impaired  Confusion Moderate  Danger to Self  Current suicidal ideation? Denies  Self-Injurious Behavior No self-injurious ideation or behavior indicators observed or expressed   Agreement Not to Harm Self Yes  Description of Agreement Verbal  Danger to Others  Danger to Others None reported or observed

## 2023-12-20 NOTE — Plan of Care (Signed)
   Problem: Education: Goal: Emotional status will improve Outcome: Progressing Goal: Mental status will improve Outcome: Progressing
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
## 2023-12-20 NOTE — Group Note (Signed)
 Date:  12/20/2023 Time:  8:26 PM  Group Topic/Focus:  Wrap-Up Group:   The focus of this group is to help patients review their daily goal of treatment and discuss progress on daily workbooks.    Participation Level:  Minimal  Participation Quality:  Supportive  Affect:  Appropriate  Cognitive:  Disorganized  Insight: Limited  Engagement in Group:  Limited  Modes of Intervention:  Discussion  Additional Comments:    Erin Riley 12/20/2023, 8:26 PM

## 2023-12-20 NOTE — Group Note (Signed)
 Recreation Therapy Group Note   Group Topic:Emotion Expression  Group Date: 12/20/2023 Start Time: 1400 End Time: 1450 Facilitators: Celestia Jeoffrey BRAVO, LRT, CTRS Location: Courtyard  Group Description: Music Reminisce. LRT encouraged patients to think of their favorite song(s) that reminded them of a positive memory or time in their life. LRT encouraged patient to talk about that memory aloud to the group. LRT played the song through a speaker for all to hear. LRT and patients discussed how thinking of a positive memory or time in their life can be used as a coping skill in everyday life post discharge.   Goal Area(s) Addressed: Patient will increase verbal communication by conversing with peers. Patient will contribute to group discussion with minimal prompting. Patient will reminisce a positive memory or moment in their life.    Affect/Mood: N/A   Participation Level: Did not attend    Clinical Observations/Individualized Feedback: Patient did not attend group.   Plan: Continue to engage patient in RT group sessions 2-3x/week.   Jeoffrey BRAVO Celestia, LRT, CTRS 12/20/2023 4:43 PM

## 2023-12-20 NOTE — Group Note (Signed)
 Recreation Therapy Group Note   Group Topic:Communication  Group Date: 12/20/2023 Start Time: 1100 End Time: 1130 Facilitators: Celestia Jeoffrey BRAVO, LRT, CTRS Location: Dayroom  Group Description: Emotional Check in. Patient sat and talked with LRT about how they are doing and whatever else is on their mind. LRT provided active listening, reassurance and encouragement. Pts were given the opportunity to listen to music or color mandalas while they talk.    Goal Area(s) Addressed: Patient will engage in conversation with LRT. Patient will communicate their wants, needs, or questions.  Patient will practice a new coping skill of "talking to someone".    Affect/Mood: N/A   Participation Level: Did not attend    Clinical Observations/Individualized Feedback: Patient did not attend group.   Plan: Continue to engage patient in RT group sessions 2-3x/week.   Jeoffrey BRAVO Celestia, LRT, CTRS 12/20/2023 1:21 PM

## 2023-12-20 NOTE — Plan of Care (Signed)
   Problem: Education: Goal: Emotional status will improve Outcome: Not Progressing Goal: Mental status will improve Outcome: Not Progressing Goal: Verbalization of understanding the information provided will improve Outcome: Not Progressing   Problem: Activity: Goal: Interest or engagement in activities will improve Outcome: Not Progressing

## 2023-12-20 NOTE — Tx Team (Signed)
 Initial Treatment Plan 12/20/2023 1:05 AM [name] FMW:[id number]    PATIENT STRESSORS: Health problems     PATIENT STRENGTHS: Communication skills    PATIENT IDENTIFIED PROBLEMS: Paranoid Schizophrenia  Hallucinations                   DISCHARGE CRITERIA:  Improved stabilization in mood, thinking, and/or behavior  PRELIMINARY DISCHARGE PLAN: Outpatient therapy  PATIENT/FAMILY INVOLVEMENT: This treatment plan has been presented to and reviewed with the patient, Erin Riley, and/or family member, .  The patient and family have been given the opportunity to ask questions and make suggestions.  Joshua Hoose, RN 12/20/2023, 1:05 AM

## 2023-12-21 DIAGNOSIS — F200 Paranoid schizophrenia: Secondary | ICD-10-CM | POA: Diagnosis not present

## 2023-12-21 MED ORDER — EMPAGLIFLOZIN 10 MG PO TABS
10.0000 mg | ORAL_TABLET | Freq: Every day | ORAL | Status: DC
  Administered 2023-12-21 – 2023-12-28 (×8): 10 mg via ORAL
  Filled 2023-12-21 (×8): qty 1

## 2023-12-21 MED ORDER — ASPIRIN 81 MG PO TBEC
81.0000 mg | DELAYED_RELEASE_TABLET | Freq: Every day | ORAL | Status: DC
  Administered 2023-12-21 – 2023-12-28 (×8): 81 mg via ORAL
  Filled 2023-12-21 (×8): qty 1

## 2023-12-21 MED ORDER — ATORVASTATIN CALCIUM 10 MG PO TABS
40.0000 mg | ORAL_TABLET | Freq: Every day | ORAL | Status: DC
  Administered 2023-12-21 – 2023-12-27 (×7): 40 mg via ORAL
  Filled 2023-12-21 (×7): qty 4

## 2023-12-21 MED ORDER — HYDRALAZINE HCL 25 MG PO TABS
25.0000 mg | ORAL_TABLET | Freq: Once | ORAL | Status: AC
  Administered 2023-12-21: 25 mg via ORAL
  Filled 2023-12-21: qty 1

## 2023-12-21 MED ORDER — CARVEDILOL 6.25 MG PO TABS
3.1250 mg | ORAL_TABLET | Freq: Two times a day (BID) | ORAL | Status: DC
Start: 2023-12-21 — End: 2023-12-28
  Administered 2023-12-21 – 2023-12-28 (×14): 3.125 mg via ORAL
  Filled 2023-12-21 (×14): qty 1

## 2023-12-21 MED ORDER — FERROUS SULFATE 325 (65 FE) MG PO TABS
325.0000 mg | ORAL_TABLET | Freq: Every day | ORAL | Status: DC
  Administered 2023-12-24: 325 mg via ORAL
  Filled 2023-12-21 (×6): qty 1

## 2023-12-21 MED ORDER — FUROSEMIDE 20 MG PO TABS
40.0000 mg | ORAL_TABLET | Freq: Two times a day (BID) | ORAL | Status: DC
  Administered 2023-12-21 – 2023-12-28 (×14): 40 mg via ORAL
  Filled 2023-12-21 (×14): qty 2

## 2023-12-21 MED ORDER — CLONIDINE HCL 0.1 MG PO TABS
0.1000 mg | ORAL_TABLET | Freq: Once | ORAL | Status: AC
  Administered 2023-12-21: 0.1 mg via ORAL
  Filled 2023-12-21: qty 1

## 2023-12-21 NOTE — Plan of Care (Signed)
  Problem: Education: Goal: Emotional status will improve Outcome: Progressing   Problem: Activity: Goal: Interest or engagement in activities will improve Outcome: Progressing   Problem: Education: Goal: Mental status will improve Outcome: Not Progressing   

## 2023-12-21 NOTE — Plan of Care (Signed)
  Problem: Education: Goal: Emotional status will improve Outcome: Progressing   Problem: Activity: Goal: Interest or engagement in activities will improve Outcome: Progressing   Problem: Physical Regulation: Goal: Ability to maintain clinical measurements within normal limits will improve Outcome: Progressing   Problem: Safety: Goal: Periods of time without injury will increase Outcome: Progressing

## 2023-12-21 NOTE — Group Note (Signed)
 Date:  12/21/2023 Time:  8:49 PM  Group Topic/Focus:  Goals Group:   The focus of this group is to help patients establish daily goals to achieve during treatment and discuss how the patient can incorporate goal setting into their daily lives to aide in recovery.    Participation Level:  Active  Participation Quality:  Appropriate  Affect:  Appropriate  Cognitive:  Appropriate  Insight: Good  Engagement in Group:  Engaged  Modes of Intervention:  Discussion  Additional Comments:   Erin Riley 12/21/2023, 8:49 PM

## 2023-12-21 NOTE — Progress Notes (Signed)
   12/21/23 1955  Psych Admission Type (Psych Patients Only)  Admission Status Voluntary  Psychosocial Assessment  Patient Complaints None  Eye Contact Fair  Facial Expression Flat  Affect Flat;Labile  Speech Logical/coherent  Interaction Assertive  Motor Activity Slow  Appearance/Hygiene In scrubs;Disheveled  Behavior Characteristics Cooperative  Mood Pleasant  Aggressive Behavior  Effect No apparent injury  Thought Process  Coherency Disorganized  Content WDL  Delusions None reported or observed  Perception WDL  Hallucination None reported or observed  Judgment Impaired  Confusion Moderate  Danger to Self  Current suicidal ideation? Denies  Self-Injurious Behavior No self-injurious ideation or behavior indicators observed or expressed   Agreement Not to Harm Self Yes  Description of Agreement Verbal  Danger to Others  Danger to Others None reported or observed

## 2023-12-21 NOTE — BH IP Treatment Plan (Signed)
 Interdisciplinary Treatment and Diagnostic Plan Update  12/21/2023 Time of Session: 10:57 AM Erin Riley MRN: [id number]  Principal Diagnosis: Paranoid schizophrenia [facility])  Secondary Diagnoses: Principal Problem:   Paranoid schizophrenia (HCC)   Current Medications:  Current Facility-Administered Medications  Medication Dose Route Frequency Provider Last Rate Last Admin   acetaminophen  (TYLENOL ) tablet 650 mg  650 mg Oral Q6H PRN Coleman, Carolyn H, NP       alum & mag hydroxide-simeth (MAALOX/MYLANTA) 200-200-20 MG/5ML suspension 30 mL  30 mL Oral Q4H PRN Coleman, Carolyn H, NP       hydrOXYzine  (ATARAX ) tablet 25 mg  25 mg Oral TID PRN Mardy Elveria VEAR, NP       magnesium  hydroxide (MILK OF MAGNESIA) suspension 30 mL  30 mL Oral Daily PRN Mardy Elveria VEAR, NP       OLANZapine  (ZYPREXA ) injection 5 mg  5 mg Intramuscular TID PRN Coleman, Carolyn H, NP       OLANZapine  (ZYPREXA ) tablet 5 mg  5 mg Oral QHS Mardy Elveria VEAR, NP   5 mg at 12/20/23 2039   OLANZapine  zydis (ZYPREXA ) disintegrating tablet 5 mg  5 mg Oral TID PRN Coleman, Carolyn H, NP       PTA Medications: Medications Prior to Admission  Medication Sig Dispense Refill Last Dose/Taking   aspirin  EC 81 MG tablet Take 1 tablet (81 mg total) by mouth daily. Swallow whole. 90 tablet 3    atorvastatin  (LIPITOR) 40 MG tablet Take 1 tablet (40 mg total) by mouth at bedtime. 90 tablet 3    carvedilol  (COREG ) 3.125 MG tablet Take 1 tablet (3.125 mg total) by mouth 2 (two) times daily with a meal. 180 tablet 3    empagliflozin  (JARDIANCE ) 10 MG TABS tablet Take 1 tablet (10 mg total) by mouth daily. 90 tablet 3    ferrous sulfate  325 (65 FE) MG tablet Take 1 tablet (325 mg total) by mouth daily with breakfast. 100 tablet 0    furosemide  (LASIX ) 40 MG tablet Take 80mg  in the morning, and 40mg  in the evening 180 tablet 3    OLANZapine  (ZYPREXA ) 5 MG tablet Take 5 mg by mouth at bedtime.      potassium chloride  (KLOR-CON  M) 10  MEQ tablet Take 3 tablets (30 mEq total) by mouth daily. 90 tablet 5    sacubitril-valsartan (ENTRESTO) 49-51 MG Take 1 tablet by mouth 2 (two) times daily. 60 tablet 5     Patient Stressors: Health problems    Patient Strengths: Communication skills   Treatment Modalities: Medication Management, Group therapy, Case management,  1 to 1 session with clinician, Psychoeducation, Recreational therapy.   Physician Treatment Plan for Primary Diagnosis: Paranoid schizophrenia (HCC) Long Term Goal(s): Improvement in symptoms so as ready for discharge   Short Term Goals: Ability to identify changes in lifestyle to reduce recurrence of condition will improve Ability to verbalize feelings will improve Ability to disclose and discuss suicidal ideas Ability to demonstrate self-control will improve Ability to identify and develop effective coping behaviors will improve  Medication Management: Evaluate patient's response, side effects, and tolerance of medication regimen.  Therapeutic Interventions: 1 to 1 sessions, Unit Group sessions and Medication administration.  Evaluation of Outcomes: Not Met  Physician Treatment Plan for Secondary Diagnosis: Principal Problem:   Paranoid schizophrenia (HCC)  Long Term Goal(s): Improvement in symptoms so as ready for discharge   Short Term Goals: Ability to identify changes in lifestyle to reduce recurrence of condition will improve Ability  to verbalize feelings will improve Ability to disclose and discuss suicidal ideas Ability to demonstrate self-control will improve Ability to identify and develop effective coping behaviors will improve     Medication Management: Evaluate patient's response, side effects, and tolerance of medication regimen.  Therapeutic Interventions: 1 to 1 sessions, Unit Group sessions and Medication administration.  Evaluation of Outcomes: Not Met   RN Treatment Plan for Primary Diagnosis: Paranoid schizophrenia (HCC) Long  Term Goal(s): Knowledge of disease and therapeutic regimen to maintain health will improve  Short Term Goals: Ability to verbalize frustration and anger appropriately will improve, Ability to demonstrate self-control, Ability to participate in decision making will improve, Ability to verbalize feelings will improve, Ability to disclose and discuss suicidal ideas, and Ability to identify and develop effective coping behaviors will improve  Medication Management: RN will administer medications as ordered by provider, will assess and evaluate patient's response and provide education to patient for prescribed medication. RN will report any adverse and/or side effects to prescribing provider.  Therapeutic Interventions: 1 on 1 counseling sessions, Psychoeducation, Medication administration, Evaluate responses to treatment, Monitor vital signs and CBGs as ordered, Perform/monitor CIWA, COWS, AIMS and Fall Risk screenings as ordered, Perform wound care treatments as ordered.  Evaluation of Outcomes: Not Met   LCSW Treatment Plan for Primary Diagnosis: Paranoid schizophrenia (HCC) Long Term Goal(s): Safe transition to appropriate next level of care at discharge, Engage patient in therapeutic group addressing interpersonal concerns.  Short Term Goals: Engage patient in aftercare planning with referrals and resources, Increase social support, Increase ability to appropriately verbalize feelings, Increase emotional regulation, Facilitate acceptance of mental health diagnosis and concerns, Facilitate patient progression through stages of change regarding substance use diagnoses and concerns, Identify triggers associated with mental health/substance abuse issues, and Increase skills for wellness and recovery  Therapeutic Interventions: Assess for all discharge needs, 1 to 1 time with Social worker, Explore available resources and support systems, Assess for adequacy in community support network, Educate family  and significant other(s) on suicide prevention, Complete Psychosocial Assessment, Interpersonal group therapy.  Evaluation of Outcomes: Not Met   Progress in Treatment: Attending groups: Yes. and No. Participating in groups: Yes. and No. Taking medication as prescribed: Yes. Toleration medication: Yes. Family/Significant other contact made: No, will contact:  CSW to contact once permission is granted.  Patient understands diagnosis: Yes. Discussing patient identified problems/goals with staff: Yes. Medical problems stabilized or resolved: Yes. Denies suicidal/homicidal ideation: Yes. Issues/concerns per patient self-inventory: No. Other: None  New problem(s) identified: No, Describe:  None  New Short Term/Long Term Goal(s):detox, elimination of symptoms of psychosis, medication management for mood stabilization; elimination of SI thoughts; development of comprehensive mental wellness/sobriety plan.    Patient Goals:  Work on my self-esteem.  Discharge Plan or Barriers: CSW to assist with the development of appropriate discharge plan.    Reason for Continuation of Hospitalization: Anxiety Depression Suicidal ideation  Estimated Length of Stay:  Last 3 [place] Suicide Severity Risk Score: Flowsheet Row Admission (Current) from 12/19/2023 in [facility] BE[facility] Most recent reading at 12/20/2023  1:00 AM ED from 12/19/2023 in [facility] Emergency Department at [facility] Most recent reading at 12/19/2023 10:01 AM ED to Hosp-Admission (Discharged) from 06/12/2023 in [facility] 3E HF PCU Most recent reading at 06/12/2023  6:50 PM  C-SSRS RISK CATEGORY No Risk No Risk No Risk    Last PHQ 2/9 Scores:    01/26/2021   10:37 AM 01/09/2018  4:02 PM 11/13/2017    9:11 AM  Depression screen PHQ 2/9  Decreased Interest 0 0 0  Down, Depressed, Hopeless 0 0 0  PHQ - 2 Score 0 0 0  Altered sleeping 0    Tired, decreased energy 0    Change in appetite  0    Feeling bad or failure about yourself  0    Trouble concentrating 0    Moving slowly or fidgety/restless 0    Suicidal thoughts 0    PHQ-9 Score 0    Difficult doing work/chores Not difficult at all      Scribe for Treatment Team: PALMYRA ROGACKI, LCSW 12/21/2023 10:57 AM

## 2023-12-21 NOTE — Group Note (Signed)
 Date:  12/21/2023 Time:  11:23 AM  Group Topic/Focus:  Diagnosis Education:   The focus of this group is to discuss the major disorders that patients maybe diagnosed with.  Group discusses the importance of knowing what one's diagnosis is so that one can understand treatment and better advocate for oneself. Emotional Education:   The focus of this group is to discuss what feelings/emotions are, and how they are experienced. Managing Feelings:   The focus of this group is to identify what feelings patients have difficulty handling and develop a plan to handle them in a healthier way upon discharge.    Participation Level:  Minimal  Participation Quality:  Appropriate  Affect:  Appropriate  Cognitive:  Alert  Insight: Good  Engagement in Group:  Engaged  Modes of Intervention:  Activity and Discussion  Additional Comments:  N/A  Harlene LITTIE Gavel 12/21/2023, 11:23 AM

## 2023-12-21 NOTE — BHH Counselor (Signed)
 CSW contacted [facility] [employer] to verify pt's legal guardian.   They report there is NO legal guardianship paperwork on file for pt at this time and there is NO legal guardian.   CSW will inform care team.   Lum Croft, MSW, [facility] 12/21/2023 9:00 AM

## 2023-12-21 NOTE — Progress Notes (Signed)
 Mood/Behavior:  Pleasant.  Cooperative with VS and treatment team.     Psych assessment:  Denies SI/HI and AVH.    Interaction / Group attendance:  Present in the milieu.  Appropriate interaction with peers. Minimal interaction with staff.   Attended one group.   Medication/ PRNs: Compliant with all scheduled medications throughout shift.   Pain: Denies  15 min checks in place for safety.    BP has been elevated.  Dr. JINNY made aware.

## 2023-12-21 NOTE — Progress Notes (Unsigned)
 The patient attended a screening event on _______, where her blood pressure was measured at ______ mmHg, and her A1C was _____%, placing her in the diabetic range. During the event, the patient reported that he/she does/does not smoke, has insurance, is/is'n't established with a primary care provider (PCP), and does/does not have identified social determinants of health (SDOH) concerns. A chart review confirmed that the patient has/does not have an active PCP.  The patient's most recent office visit was on ______, where she was referred by her PCP for _____.  ________ is appropriately listed in her medical record, and a follow-up appointment with her PCP is scheduled for _______.  The patient demonstrates consistent engagement in her care. At this time, no additional support from the Health [facility] Team is indicated./An additional follow up will be done at a later date per the [facility] Teams protocol.

## 2023-12-21 NOTE — Plan of Care (Signed)
  Problem: Education: Goal: Emotional status will improve Outcome: Progressing   Problem: Activity: Goal: Interest or engagement in activities will improve Outcome: Progressing   Problem: Coping: Goal: Ability to demonstrate self-control will improve Outcome: Progressing

## 2023-12-21 NOTE — Group Note (Signed)
 Physical/Occupational Therapy Group Note  Group Topic: UE Therex   Group Date: 12/21/2023 Start Time: 1305 End Time: 1350 Facilitators: Clive Warren CROME, OT    Group Description: Group instructed in series of upper extremities exercises, aimed to promote strength, flexibility, range of motion and functional endurance.  Patients provided cuing for proper mechanics and proper pace of exercise; exercises adjusted as necessary for individualized patient needs.  Patient also engaged in cognitive components throughout session, working to integrate attention to task, command following, turn-taking and appropriate social interaction throughout session.  Allowed to ask questions as appropriate, and encouraged to identify specific exercises that they could complete independently outside of group sessions.   Therapeutic Goal(s): Demonstrate appropriate performance of upper extremity exercises to promote strength, flexibility, range of motion and functional endurance Identify 2-3 specific upper extremity exercises to complete as home exercise program outside of group session   Individual Participation: Pt did not attend.  Participation Level: Did not attend   Participation Quality:   Behavior:   Speech/Thought Process:   Affect/Mood:   Insight:   Judgement:   Modes of Intervention:   Patient Response to Interventions:    Plan: Continue to engage patient in PT/OT groups 1 - 2x/week.  Hercules Hasler R., MPH, MS, OTR/L ascom [phone] 12/21/23, 2:15 PM

## 2023-12-21 NOTE — Progress Notes (Signed)
(  Sleep Hours) - 9  (Any PRNs that were needed, meds refused, or side effects to meds)- n/a  (Any disturbances and when (visitation, over night)- n/a  (Concerns raised by the patient)- n/a  (SI/HI/AVH)- denies

## 2023-12-21 NOTE — Group Note (Signed)
 Recreation Therapy Group Note   Group Topic:Leisure Education  Group Date: 12/21/2023 Start Time: 1500 End Time: 1600 Facilitators: Celestia Jeoffrey BRAVO, LRT, CTRS Location: Courtyard  Group Description: Music. Patients are encouraged to name their favorite song(s) for LRT to play song through speaker for group to hear, while in the courtyard getting fresh air and sunlight. Patients educated on the definition of leisure and the importance of having different leisure interests outside of the hospital. Group discussed how leisure activities can often be used as Pharmacologist and that listening to music and being outside are examples.    Goal Area(s) Addressed:  Patient will identify a current leisure interest.  Patient will practice making a positive decision. Patient will have the opportunity to try a new leisure activity.   Affect/Mood: N/A   Participation Level: Did not attend    Clinical Observations/Individualized Feedback: Patient did not attend group.   Plan: Continue to engage patient in RT group sessions 2-3x/week.   Jeoffrey BRAVO Celestia, LRT, CTRS 12/21/2023 5:02 PM

## 2023-12-21 NOTE — Progress Notes (Signed)
 [facility] MD Progress Note  12/21/2023 10:01 PM Erin Riley  MRN:  [id number]  Patient is a 73 year old female presenting the ED today for concerns for hallucinations, believing that aliens are climbing trees in her backyard and have been implanting objects in her legs causing them to swell. Noted that they have also been stealing food from her. She also believes that she is 3 months pregnant. She is concerned for her leg swelling bilaterally which have been ongoing for many years. Patient is admitted to [facility] unit with Q15 min safety monitoring. Multidisciplinary team approach is offered. Medication management; group/milieu therapy is offered.   Subjective:  Chart reviewed, case discussed in multidisciplinary meeting, patient seen during rounds.  Patient met with the treatment team today.  Patient was very engaging, cooperative.  She is noted to be clear minded.  She denies auditory/visual hallucinations.  She reports taking her medications with no reported side effects.  She is not able to give details about her understanding of the current admission.  When asked about her son being the legal guardian she got upset stating she has her own guardian and that her son only helps her out.  Per nursing patient is noted to be participating in the groups all morning and is calm and cooperative.  She denies SI/HI/plan.  She has fair appetite and sleep.  She offers no complaints.  Social work team has reached out to patient's aunt to get the medications list.   Sleep: Fair  Appetite:  Fair  Past Psychiatric History: see h&P Family History:  Family History  Problem Relation Age of Onset   Hypertension Mother    Breast cancer Neg Hx    Social History:  Social History   Substance and Sexual Activity  Alcohol Use No     Social History   Substance and Sexual Activity  Drug Use No    Social History   Socioeconomic History   Marital status: Single    Spouse name: Not on file   Number of  children: 7   Years of education: Not on file   Highest education level: Not on file  Occupational History   Not on file  Tobacco Use   Smoking status: Some Days    Current packs/day: 0.00    Types: Cigarettes    Last attempt to quit: 05/14/2002    Years since quitting: 21.6   Smokeless tobacco: Never  Vaping Use   Vaping status: Never Used  Substance and Sexual Activity   Alcohol use: No   Drug use: No   Sexual activity: Yes    Birth control/protection: Post-menopausal, None  Other Topics Concern   Not on file  Social History Narrative   Now single mother of 2 with one grandchild. She quit smoking roughly 5 years ago and is not so since. She has also stopped drinking alcohol. She does try get routine exercise walking at least a mile 3-4 days a week.    She lives with her 49 year old mother. 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   Social Drivers of Corporate investment banker Strain: Not on file  Food Insecurity: No Food Insecurity (12/20/2023)   Hunger Vital Sign    Worried About Running Out of Food in the Last Year: Never true    Ran Out of Food in the Last Year: Never true  Transportation Needs: No Transportation Needs (12/20/2023)   PRAPARE - Transportation    Lack of Transportation (Medical): No    Lack of  Transportation (Non-Medical): No  Physical Activity: Not on file  Stress: Not on file  Social Connections: Unknown (12/20/2023)   Social Connection and Isolation Panel    Frequency of Communication with Friends and Family: Patient unable to answer    Frequency of Social Gatherings with Friends and Family: Patient unable to answer    Attends Religious Services: Patient unable to answer    Active Member of Clubs or Organizations: No    Attends Banker Meetings: Patient unable to answer    Marital Status: Patient unable to answer   Past Medical History:  Past Medical History:  Diagnosis Date   Chronic back pain    Chronic kidney disease (CKD), stage II  (mild)    Class I-II   Coronary artery disease 04/2009   50% stenosis in the perforator of LAD; catheterization was for an abnormal Myoview in January 2000 showing anterior and inferolateral ischemia.   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 > 3 subsequent Echos show improved/normal EF with moderate to severe concentric LVH and diastolic dysfunction with LVOT/intracavitary gradient --> 06/2016: Severe LVH.  Vigorous EF, 65-70%.?? Gr 1 DD. Mild AS.   Hyperlipidemia    Hypertension    Hypertensive hypertrophic cardiomyopathy: NYHA class II:  Echo: Severe concentric LVH with LV OT gradient; essentially preserved EF with diastolic dysfunction 02/15/2013   Echo 06/2016: Severe Concentric LVH. Vigorous EF 65-70%. ~ Gr I DD.    Mild aortic stenosis by prior echocardiography    Echo 06/2016: Mild AS (Mean Gradient 15 mmHg); has had prior Mod-Severe MR (not seen on current echo)   PAD (peripheral artery disease) March 2013   Lower extremity Dopplers: R. SFA 50-60%, R. PTA proximally occluded with distal reconstitution;; L. common iliac ~50%, L. SFA 50-70% stenosis, L. PTA < 50%   Schizophrenia (HCC)     Past Surgical History:  Procedure Laterality Date   BUNIONECTOMY     carotid doppler  05/29/2011   left bulb/prox ICA moderate amtfibrous plaque with no evidence significant reduction.,right bulb /proximal ICA normal patency   lower extremity doppler  05/29/2011   right SFA 50% to 59% diameter reduction,right posterior tibal atreery occlusive disease,reconstituting distally, left common illiac<50%,left SFA 50 to70%,left post. tibial <50%   NM MYOCAR PERF WALL MOTION  03/2009   Persantine; EF 51%-both anterior and inferolateral ischemia   TRANSTHORACIC ECHOCARDIOGRAM  06/2016   Severe LVH.  Vigorous EF of 65-70%.  No RWMA. ~Only grade 1 diastolic dysfunction.  Mild aortic stenosis (mean  gradient 15 mmHg)   TRANSTHORACIC ECHOCARDIOGRAM  07/2012   EF 50-55%; severe concentric LVH; only grade 1 diastolic dysfunction. Mild aortic sclerosis - with LVOT /intracavitary gradient of roughly 20 mmHg mean. Mild to moderately dilated LA;; previously reported MR not seen    Current Medications: Current Facility-Administered Medications  Medication Dose Route Frequency Provider Last Rate Last Admin   acetaminophen  (TYLENOL ) tablet 650 mg  650 mg Oral Q6H PRN Coleman, Carolyn H, NP       alum & mag hydroxide-simeth (MAALOX/MYLANTA) 200-200-20 MG/5ML suspension 30 mL  30 mL Oral Q4H PRN Coleman, Carolyn H, NP       aspirin  EC tablet 81 mg  81 mg Oral Daily Evamae Rowen, MD   81 mg at 12/21/23 1347   atorvastatin  (LIPITOR) tablet 40 mg  40 mg Oral QHS Dudley Cooley, MD   40  mg at 12/21/23 2114   carvedilol  (COREG ) tablet 3.125 mg  3.125 mg Oral BID WC Mariavictoria Nottingham, MD   3.125 mg at 12/21/23 1640   empagliflozin  (JARDIANCE ) tablet 10 mg  10 mg Oral Daily Emori Mumme, MD   10 mg at 12/21/23 1347   [START ON 12/22/2023] ferrous sulfate  tablet 325 mg  325 mg Oral Q breakfast Adilson Grafton, MD       furosemide  (LASIX ) tablet 40 mg  40 mg Oral BID Adilene Areola, MD   40 mg at 12/21/23 1640   hydrOXYzine  (ATARAX ) tablet 25 mg  25 mg Oral TID PRN Coleman, Carolyn H, NP       magnesium  hydroxide (MILK OF MAGNESIA) suspension 30 mL  30 mL Oral Daily PRN Coleman, Carolyn H, NP       OLANZapine  (ZYPREXA ) injection 5 mg  5 mg Intramuscular TID PRN Coleman, Carolyn H, NP       OLANZapine  (ZYPREXA ) tablet 5 mg  5 mg Oral QHS Coleman, Carolyn H, NP   5 mg at 12/21/23 2114   OLANZapine  zydis (ZYPREXA ) disintegrating tablet 5 mg  5 mg Oral TID PRN Mardy Elveria DEL, NP        Lab Results: No results found for this or any previous visit (from the past 48 hours).  Blood Alcohol level:  Lab Results  Component Value Date   [facility] <15 12/19/2023   ETH <10 01/03/2023    Metabolic Disorder  Labs: Lab Results  Component Value Date   HGBA1C 5.8 (H) 06/10/2022   MPG 120 06/10/2022   MPG 102.54 05/13/2020   Lab Results  Component Value Date   PROLACTIN 27.3 (H) 12/18/2019   PROLACTIN 16.5 06/09/2016   Lab Results  Component Value Date   CHOL 147 03/27/2022   TRIG 89 03/27/2022   HDL 37 (L) 03/27/2022   CHOLHDL 4.0 03/27/2022   VLDL 18 03/27/2022   LDLCALC 92 03/27/2022   LDLCALC 101 (H) 02/15/2021    Physical Findings: AIMS:  , ,  ,  ,    CIWA:    COWS:      Psychiatric Specialty Exam:  Presentation  General Appearance:  Casual  Eye Contact: Fair  Speech: Normal Rate  Speech Volume: Normal    Mood and Affect  Mood: Anxious  Affect: Flat   Thought Process  Thought Processes: Coherent  Descriptions of Associations:Intact  Orientation:Full (Time, Place and Person)  Thought Content:-- (impoverished)  Hallucinations:Hallucinations: None  Ideas of Reference:Paranoia  Suicidal Thoughts:Suicidal Thoughts: No  Homicidal Thoughts:Homicidal Thoughts: No   Sensorium  Memory: Immediate Fair; Recent Fair  Judgment: Impaired  Insight: Shallow   Executive Functions  Concentration: Fair  Attention Span: Fair  Recall: Poor  Fund of Knowledge: Poor  Language: Poor   Psychomotor Activity  Psychomotor Activity: Psychomotor Activity: Normal  Musculoskeletal: Strength & Muscle Tone: within normal limits Gait & Station: normal Assets  Assets: Manufacturing systems engineer; Desire for Improvement; Housing; Resilience    Physical Exam: Physical Exam Vitals and nursing note reviewed.    ROS Blood pressure (!) 156/85, pulse 77, temperature (!) 96.6 F (35.9 C), resp. rate 20, height 5' 3 (1.6 m), weight 96.2 kg, last menstrual period 05/10/2013, SpO2 100%. Body mass index is 37.55 kg/m.  Diagnosis: Principal Problem:   Paranoid schizophrenia (HCC)  Clinical Decision Making: Patient with history of schizophrenia  paranoid type is currently admitted for worsening psychosis, paranoia, physical and verbal aggression in the context of noncompliance with her medications.  Patient lacks insight into her mental health problems and continue to refuse assessments and medications.  Son reports being her legal guardian and approves nonemergent forced medication.  Patient needs ongoing monitoring on the inpatient unit for further stabilization  Treatment Plan Summary:  Safety and Monitoring:             -- Voluntary admission to inpatient psychiatric unit for safety, stabilization and treatment             -- Daily contact with patient to assess and evaluate symptoms and progress in treatment             -- Patient's case to be discussed in multi-disciplinary team meeting             -- Observation Level: q15 minute checks             -- Vital signs:  q12 hours             -- Precautions: suicide, elopement, and assault   2. Psychiatric Diagnoses and Treatment:                Zyprexa  5 mg BID oral and if she refuses will initiate none emergent medication of zyprexa  5 mg bID IM once we verify the legal guardian ship papers.   -- The risks/benefits/side-effects/alternatives to this medication were discussed in detail with the patient and time was given for questions. The patient consents to medication trial.                -- Metabolic profile and EKG monitoring obtained while on an atypical antipsychotic (BMI: Lipid Panel: HbgA1c: QTc:)              -- Encouraged patient to participate in unit milieu and in scheduled group therapies                            3. Medical Issues Being Addressed:     4. Discharge Planning:   -- Social work and case management to assist with discharge planning and identification of hospital follow-up needs prior to discharge  -- Estimated LOS: 3-4 days  Allyn Foil, MD 12/21/2023, 10:01 PM

## 2023-12-22 DIAGNOSIS — F200 Paranoid schizophrenia: Secondary | ICD-10-CM | POA: Diagnosis not present

## 2023-12-22 MED ORDER — AMLODIPINE BESYLATE 5 MG PO TABS
10.0000 mg | ORAL_TABLET | Freq: Every day | ORAL | Status: DC
  Administered 2023-12-22 – 2023-12-27 (×5): 10 mg via ORAL
  Filled 2023-12-22 (×5): qty 2

## 2023-12-22 MED ORDER — CLONIDINE HCL 0.1 MG PO TABS
0.1000 mg | ORAL_TABLET | Freq: Once | ORAL | Status: AC
  Administered 2023-12-22: 0.1 mg via ORAL
  Filled 2023-12-22: qty 1

## 2023-12-22 NOTE — Progress Notes (Addendum)
   12/22/23 1612  Vital Signs  Pulse Rate 95  BP (!) 183/98   Gave scheduled coreg  and notified MD of BP Verbal order to give 1x does 0.1 clonidine  once and amlodipine  10 mg daily at night

## 2023-12-22 NOTE — Progress Notes (Signed)
   12/22/23 1006  [place] Violence Checklist (Document every shift between 4 & 7)  Confusion 1  Irritable 1  Boisterous 1  Physical threats 0  Verbal threats 1  Attacking objects 0  Total Score 4  Violence Risk Category High Risk  Violence Prevention Guidelines *See Row Information* High Violence Risk interventions implemented  Violence Risk Additional Interventions Supportive listening;Decreased stimulation   Pt began cursing and screaming at staff. Stating do not fucking touch me none of you touch me pt was observed siting alone in a chair in the hallway. Pt was de-escalated and agreed to go to her room. Will continue to monitor.

## 2023-12-22 NOTE — Plan of Care (Signed)
   Problem: Education: Goal: Emotional status will improve Outcome: Progressing

## 2023-12-22 NOTE — Progress Notes (Addendum)
   12/22/23 0822  Vital Signs  Temp 98 F (36.7 C)  Pulse Rate 92  Pulse Rate Source Monitor  Resp 18  BP (!) 158/103  BP Location Left Arm  BP Method Automatic   Notified MD Madaram of elevated BP no new orders at this time. Pt got schedule BO meds. 1054 Pt refused to let RN recheck Bp

## 2023-12-22 NOTE — Group Note (Signed)
 Date:  12/22/2023 Time:  12:53 PM  Group Topic/Focus:  Conflict Resolution:   The focus of this group is to discuss the conflict resolution process and how it may be used upon discharge. Personal Choices and Values:   The focus of this group is to help patients assess and explore the importance of values in their lives, how their values affect their decisions, how they express their values and what opposes their expression. Stages of Change:   The focus of this group is to explain the stages of change and help patients identify changes they want to make upon discharge.    Participation Level:  None  Participation Quality:  None  Affect: None  Cognitive:  Oriented  Insight: None  Engagement in Group:  None  Modes of Intervention:  None  Additional Comments:  Came but did not Participate   Erin Riley Gavel 12/22/2023, 12:53 PM

## 2023-12-22 NOTE — Progress Notes (Signed)
   12/22/23 0828  Psych Admission Type (Psych Patients Only)  Admission Status Voluntary  Psychosocial Assessment  Patient Complaints None (Pt is irritable during assessment, Pt responded with short curt answers and had hostile tone.)  Eye Contact Brief  Facial Expression Other (Comment) (tense)  Affect Irritable  Speech Logical/coherent;Argumentative  Interaction Defensive  Motor Activity Other (Comment) (WNL)  Appearance/Hygiene In scrubs  Behavior Characteristics Irritable  Mood Suspicious  Thought Process  Coherency Disorganized  Content Preoccupation  Delusions None reported or observed  Perception WDL  Hallucination None reported or observed  Judgment Impaired  Confusion Moderate  Danger to Self  Current suicidal ideation? Denies

## 2023-12-22 NOTE — Progress Notes (Signed)
 [facility] MD Progress Note  12/22/2023 7:50 PM Erin Riley  MRN:  [id number]  Patient is a 73 year old female presenting the ED today for concerns for hallucinations, believing that aliens are climbing trees in her backyard and have been implanting objects in her legs causing them to swell. Noted that they have also been stealing food from her. She also believes that she is 3 months pregnant. She is concerned for her leg swelling bilaterally which have been ongoing for many years. Patient is admitted to [facility] unit with Q15 min safety monitoring. Multidisciplinary team approach is offered. Medication management; group/milieu therapy is offered.   Subjective:  Chart reviewed, case discussed in multidisciplinary meeting, patient seen during rounds.  Patient met with the treatment team today.  Patient was very engaging, cooperative.  She is noted to be clear minded.  She denies auditory/visual hallucinations.  She reports taking her medications with no reported side effects.  She is not able to give details about her understanding of the current admission.  When asked about her son being the legal guardian she got upset stating she has her own guardian and that her son only helps her out.  Per nursing patient is noted to be participating in the groups all morning and is calm and cooperative.  She denies SI/HI/plan.  She has fair appetite and sleep.  She offers no complaints.  Social work team has reached out to patient's aunt to get the medications list. Patient continues to focus that she is pregnant and she was made pregnant by aliens she talks about a lot of trauma.  Her blood pressure was reportedly high she was started on amlodipine  and also was given clonidine .  Sleep: Fair  Appetite:  Fair  Past Psychiatric History: see h&P Family History:  Family History  Problem Relation Age of Onset   Hypertension Mother    Breast cancer Neg Hx    Social History:  Social History   Substance and Sexual  Activity  Alcohol Use No     Social History   Substance and Sexual Activity  Drug Use No    Social History   Socioeconomic History   Marital status: Single    Spouse name: Not on file   Number of children: 7   Years of education: Not on file   Highest education level: Not on file  Occupational History   Not on file  Tobacco Use   Smoking status: Some Days    Current packs/day: 0.00    Types: Cigarettes    Last attempt to quit: 05/14/2002    Years since quitting: 21.6   Smokeless tobacco: Never  Vaping Use   Vaping status: Never Used  Substance and Sexual Activity   Alcohol use: No   Drug use: No   Sexual activity: Yes    Birth control/protection: Post-menopausal, None  Other Topics Concern   Not on file  Social History Narrative   Now single mother of 2 with one grandchild. She quit smoking roughly 5 years ago and is not so since. She has also stopped drinking alcohol. She does try get routine exercise walking at least a mile 3-4 days a week.    She lives with her 15 year old mother. 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   Social Drivers of Corporate investment banker Strain: Not on file  Food Insecurity: No Food Insecurity (12/20/2023)   Hunger Vital Sign    Worried About Running Out of Food in the Last Year: Never true  Ran Out of Food in the Last Year: Never true  Transportation Needs: No Transportation Needs (12/20/2023)   PRAPARE - Administrator, Civil Service (Medical): No    Lack of Transportation (Non-Medical): No  Physical Activity: Not on file  Stress: Not on file  Social Connections: Unknown (12/20/2023)   Social Connection and Isolation Panel    Frequency of Communication with Friends and Family: Patient unable to answer    Frequency of Social Gatherings with Friends and Family: Patient unable to answer    Attends Religious Services: Patient unable to answer    Active Member of Clubs or Organizations: No    Attends Banker  Meetings: Patient unable to answer    Marital Status: Patient unable to answer   Past Medical History:  Past Medical History:  Diagnosis Date   Chronic back pain    Chronic kidney disease (CKD), stage II (mild)    Class I-II   Coronary artery disease 04/2009   50% stenosis in the perforator of LAD; catheterization was for an abnormal Myoview in January 2000 showing anterior and inferolateral ischemia.   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 > 3 subsequent Echos show improved/normal EF with moderate to severe concentric LVH and diastolic dysfunction with LVOT/intracavitary gradient --> 06/2016: Severe LVH.  Vigorous EF, 65-70%.?? Gr 1 DD. Mild AS.   Hyperlipidemia    Hypertension    Hypertensive hypertrophic cardiomyopathy: NYHA class II:  Echo: Severe concentric LVH with LV OT gradient; essentially preserved EF with diastolic dysfunction 02/15/2013   Echo 06/2016: Severe Concentric LVH. Vigorous EF 65-70%. ~ Gr I DD.    Mild aortic stenosis by prior echocardiography    Echo 06/2016: Mild AS (Mean Gradient 15 mmHg); has had prior Mod-Severe MR (not seen on current echo)   PAD (peripheral artery disease) March 2013   Lower extremity Dopplers: R. SFA 50-60%, R. PTA proximally occluded with distal reconstitution;; L. common iliac ~50%, L. SFA 50-70% stenosis, L. PTA < 50%   Schizophrenia (HCC)     Past Surgical History:  Procedure Laterality Date   BUNIONECTOMY     carotid doppler  05/29/2011   left bulb/prox ICA moderate amtfibrous plaque with no evidence significant reduction.,right bulb /proximal ICA normal patency   lower extremity doppler  05/29/2011   right SFA 50% to 59% diameter reduction,right posterior tibal atreery occlusive disease,reconstituting distally, left common illiac<50%,left SFA 50 to70%,left post. tibial <50%   NM MYOCAR PERF WALL MOTION  03/2009    Persantine; EF 51%-both anterior and inferolateral ischemia   TRANSTHORACIC ECHOCARDIOGRAM  06/2016   Severe LVH.  Vigorous EF of 65-70%.  No RWMA. ~Only grade 1 diastolic dysfunction.  Mild aortic stenosis (mean gradient 15 mmHg)   TRANSTHORACIC ECHOCARDIOGRAM  07/2012   EF 50-55%; severe concentric LVH; only grade 1 diastolic dysfunction. Mild aortic sclerosis - with LVOT /intracavitary gradient of roughly 20 mmHg mean. Mild to moderately dilated LA;; previously reported MR not seen    Current Medications: Current Facility-Administered Medications  Medication Dose Route Frequency Provider Last Rate Last Admin   acetaminophen  (TYLENOL ) tablet 650 mg  650 mg Oral Q6H PRN Coleman, Carolyn H, NP       alum & mag hydroxide-simeth (MAALOX/MYLANTA) 200-200-20 MG/5ML suspension 30 mL  30 mL Oral Q4H PRN Coleman, Carolyn H, NP       amLODipine  (NORVASC ) tablet  10 mg  10 mg Oral Daily Aamira Bischoff R, MD       aspirin  EC tablet 81 mg  81 mg Oral Daily Jadapalle, Sree, MD   81 mg at 12/22/23 0817   atorvastatin  (LIPITOR) tablet 40 mg  40 mg Oral QHS Jadapalle, Sree, MD   40 mg at 12/21/23 2114   carvedilol  (COREG ) tablet 3.125 mg  3.125 mg Oral BID WC Jadapalle, Sree, MD   3.125 mg at 12/22/23 1610   empagliflozin  (JARDIANCE ) tablet 10 mg  10 mg Oral Daily Jadapalle, Sree, MD   10 mg at 12/22/23 9182   ferrous sulfate  tablet 325 mg  325 mg Oral Q breakfast Jadapalle, Sree, MD       furosemide  (LASIX ) tablet 40 mg  40 mg Oral BID Jadapalle, Sree, MD   40 mg at 12/22/23 1641   hydrOXYzine  (ATARAX ) tablet 25 mg  25 mg Oral TID PRN Mardy Elveria DEL, NP       magnesium  hydroxide (MILK OF MAGNESIA) suspension 30 mL  30 mL Oral Daily PRN Coleman, Carolyn H, NP       OLANZapine  (ZYPREXA ) injection 5 mg  5 mg Intramuscular TID PRN Mardy Elveria DEL, NP       OLANZapine  (ZYPREXA ) tablet 5 mg  5 mg Oral QHS Coleman, Carolyn H, NP   5 mg at 12/21/23 2114   OLANZapine  zydis (ZYPREXA ) disintegrating tablet 5 mg   5 mg Oral TID PRN Mardy Elveria DEL, NP        Lab Results: No results found for this or any previous visit (from the past 48 hours).  Blood Alcohol level:  Lab Results  Component Value Date   [facility] <15 12/19/2023   ETH <10 01/03/2023    Metabolic Disorder Labs: Lab Results  Component Value Date   HGBA1C 5.8 (H) 06/10/2022   MPG 120 06/10/2022   MPG 102.54 05/13/2020   Lab Results  Component Value Date   PROLACTIN 27.3 (H) 12/18/2019   PROLACTIN 16.5 06/09/2016   Lab Results  Component Value Date   CHOL 147 03/27/2022   TRIG 89 03/27/2022   HDL 37 (L) 03/27/2022   CHOLHDL 4.0 03/27/2022   VLDL 18 03/27/2022   LDLCALC 92 03/27/2022   LDLCALC 101 (H) 02/15/2021    Physical Findings: AIMS:  , ,  ,  ,    CIWA:    COWS:      Psychiatric Specialty Exam:  Presentation  General Appearance:  Casual  Eye Contact: Fair  Speech: Normal Rate  Speech Volume: Normal    Mood and Affect  Mood: Anxious  Affect: Flat   Thought Process  Thought Processes: Coherent  Descriptions of Associations:Intact  Orientation:Full (Time, Place and Person)  Thought Content:-- (impoverished)  Hallucinations:Hallucinations: None  Ideas of Reference:Paranoia  Suicidal Thoughts:Suicidal Thoughts: No  Homicidal Thoughts:Homicidal Thoughts: No   Sensorium  Memory: Immediate Fair; Recent Fair  Judgment: Impaired  Insight: Shallow   Executive Functions  Concentration: Fair  Attention Span: Fair  Recall: Poor  Fund of Knowledge: Poor  Language: Poor   Psychomotor Activity  Psychomotor Activity: Psychomotor Activity: Normal  Musculoskeletal: Strength & Muscle Tone: within normal limits Gait & Station: normal Assets  Assets: Manufacturing systems engineer; Desire for Improvement; Housing; Resilience    Physical Exam: Physical Exam Vitals and nursing note reviewed.    ROS Blood pressure (!) 159/75, pulse 95, temperature 98 F (36.7 C),  resp. rate 17, height 5' 3 (1.6 m), weight 96.2 kg, last  menstrual period 05/10/2013, SpO2 99%. Body mass index is 37.55 kg/m.  Diagnosis: Principal Problem:   Paranoid schizophrenia (HCC)  Clinical Decision Making: Patient with history of schizophrenia paranoid type is currently admitted for worsening psychosis, paranoia, physical and verbal aggression in the context of noncompliance with her medications.  Patient lacks insight into her mental health problems and continue to refuse assessments and medications.  Son reports being her legal guardian and approves nonemergent forced medication.  Patient needs ongoing monitoring on the inpatient unit for further stabilization  Treatment Plan Summary:  Safety and Monitoring:             -- Voluntary admission to inpatient psychiatric unit for safety, stabilization and treatment             -- Daily contact with patient to assess and evaluate symptoms and progress in treatment             -- Patient's case to be discussed in multi-disciplinary team meeting             -- Observation Level: q15 minute checks             -- Vital signs:  q12 hours             -- Precautions: suicide, elopement, and assault   2. Psychiatric Diagnoses and Treatment:                Zyprexa  5 mg BID oral and if she refuses will initiate none emergent medication of zyprexa  5 mg bID IM once we verify the legal guardian ship papers.   -- The risks/benefits/side-effects/alternatives to this medication were discussed in detail with the patient and time was given for questions. The patient consents to medication trial.                -- Metabolic profile and EKG monitoring obtained while on an atypical antipsychotic (BMI: Lipid Panel: HbgA1c: QTc:)              -- Encouraged patient to participate in unit milieu and in scheduled group therapies                            3. Medical Issues Being Addressed:     4. Discharge Planning:   -- Social work and case management  to assist with discharge planning and identification of hospital follow-up needs prior to discharge  -- Estimated LOS: 3-4 days  Millie JONELLE Manners, MD 12/22/2023, 7:50 PM

## 2023-12-22 NOTE — Group Note (Signed)
 Date:  12/22/2023 Time:  4:22 PM  Group Topic/Focus:  Personal Choices and Values:   The focus of this group is to help patients assess and explore the importance of values in their lives, how their values affect their decisions, how they express their values and what opposes their expression.    Participation Level:  Active  Participation Quality:  Appropriate  Affect:  Appropriate  Cognitive:  Appropriate  Insight: Appropriate  Engagement in Group:  Engaged  Modes of Intervention:  Discussion   Erin Riley 12/22/2023, 4:22 PM

## 2023-12-22 NOTE — Group Note (Unsigned)
 Date:  12/22/2023 Time:  11:20 AM  Group Topic/Focus:  Managing Feelings:   The focus of this group is to identify what feelings patients have difficulty handling and develop a plan to handle them in a healthier way upon discharge. Rediscovering Joy:   The focus of this group is to explore various ways to relieve stress in a positive manner.     Participation Level:  {BHH PARTICIPATION OZCZO:77735}  Participation Quality:  {BHH PARTICIPATION QUALITY:22265}  Affect:  {BHH AFFECT:22266}  Cognitive:  {BHH COGNITIVE:22267}  Insight: {BHH Insight2:20797}  Engagement in Group:  {BHH ENGAGEMENT IN HMNLE:77731}  Modes of Intervention:  {BHH MODES OF INTERVENTION:22269}  Additional Comments:  ***  Brandelyn Henne L Melady Chow 12/22/2023, 11:20 AM

## 2023-12-23 DIAGNOSIS — F200 Paranoid schizophrenia: Secondary | ICD-10-CM | POA: Diagnosis not present

## 2023-12-23 NOTE — Group Note (Signed)
 Date:  12/23/2023 Time:  9:53 PM  Group Topic/Focus:  Wrap-Up Group:   The focus of this group is to help patients review their daily goal of treatment and discuss progress on daily workbooks.    Participation Level:  Active  Participation Quality:  Appropriate  Affect:  Appropriate  Cognitive:  Appropriate  Insight: Appropriate  Engagement in Group:  Engaged  Modes of Intervention:  Discussion  Additional Comments:    Cayla Wiegand K 12/23/2023, 9:53 PM

## 2023-12-23 NOTE — Progress Notes (Signed)
 [facility] MD Progress Note  12/23/2023 12:22 PM Erin Riley  MRN:  [id number]  Patient: 73 year old female Reason for Admission: Psychosis with fixed delusional beliefs; concern for hallucinations and somatic delusions. Current Setting: Geriatric Psychiatry Unit - Q15-minute safety monitoring in place. Treatment Modalities: Medication management, group therapy, milieu therapy, and multidisciplinary treatment approach.  Subjective:  Chart reviewed and case discussed in the multidisciplinary treatment meeting. Patient was evaluated during rounds and met with the treatment team today. She was pleasant, cooperative, and engaging throughout the interview. Her thought process appeared organized, and she was able to respond appropriately.  Patient denies auditory or visual hallucinations at this time. She reports good medication adherence and denies any medication-related side effects. However, she demonstrates limited insight into her psychiatric condition and the reason for hospitalization. When asked about her son's role as her legal guardian, she became irritable, insisting that she has her "own guardian" and that her son only assists her as needed.  Patient continues to express fixed delusional beliefs, stating that she is three months pregnant due to alien impregnation and that aliens have implanted objects in her legs, causing swelling. She also reports a history of significant trauma, though details remain vague.  Per nursing, the patient has been participating in group activities, presenting as calm and cooperative with no behavioral disturbances. She denies suicidal ideation, homicidal ideation, or any active plan. Appetite and sleep are reported as fair.  The social work team has reached out to the patient's aunt to obtain an updated medication list and collateral information.  Patient's blood pressure was elevated; she was initiated on amlodipine  and received clonidine  as needed for blood  pressure control.  Assessment:  Persistent delusional disorder / psychosis, unspecified with somatic and bizarre content (pregnancy by aliens, implanted devices).  Limited insight and judgment into current psychiatric and medical condition.  Compliant with medications and treatment plan; no acute safety concerns at this time.  Medically stable; hypertension managed with antihypertensive agents.  Plan:  Continue inpatient psychiatric care with Q15-minute safety checks.  Continue psychotropic medication regimen - monitor for efficacy and side effects.  Continue antihypertensive management (amlodipine , clonidine  PRN).  Encourage participation in group and milieu therapy for socialization and reality orientation.  Ongoing social work involvement for collateral information and discharge planning.  Monitor delusional content and assess for changes in insight and thought organization.  Provide psychoeducation regarding medication adherence and psychiatric stabilization.  Continue multidisciplinary care coordination with nursing, social work, and medical teams. Sleep: Fair  Appetite:  Fair  Past Psychiatric History: see h&P Family History:  Family History  Problem Relation Age of Onset   Hypertension Mother    Breast cancer Neg Hx    Social History:  Social History   Substance and Sexual Activity  Alcohol Use No     Social History   Substance and Sexual Activity  Drug Use No    Social History   Socioeconomic History   Marital status: Single    Spouse name: Not on file   Number of children: 7   Years of education: Not on file   Highest education level: Not on file  Occupational History   Not on file  Tobacco Use   Smoking status: Some Days    Current packs/day: 0.00    Types: Cigarettes    Last attempt to quit: 05/14/2002    Years since quitting: 21.6   Smokeless tobacco: Never  Vaping Use   Vaping status: Never Used  Substance and Sexual  Activity    Alcohol use: No   Drug use: No   Sexual activity: Yes    Birth control/protection: Post-menopausal, None  Other Topics Concern   Not on file  Social History Narrative   Now single mother of 2 with one grandchild. She quit smoking roughly 5 years ago and is not so since. She has also stopped drinking alcohol. She does try get routine exercise walking at least a mile 3-4 days a week.    She lives with her 40 year old mother. 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   Social Drivers of Corporate investment banker Strain: Not on file  Food Insecurity: No Food Insecurity (12/20/2023)   Hunger Vital Sign    Worried About Running Out of Food in the Last Year: Never true    Ran Out of Food in the Last Year: Never true  Transportation Needs: No Transportation Needs (12/20/2023)   PRAPARE - Administrator, Civil Service (Medical): No    Lack of Transportation (Non-Medical): No  Physical Activity: Not on file  Stress: Not on file  Social Connections: Unknown (12/20/2023)   Social Connection and Isolation Panel    Frequency of Communication with Friends and Family: Patient unable to answer    Frequency of Social Gatherings with Friends and Family: Patient unable to answer    Attends Religious Services: Patient unable to answer    Active Member of Clubs or Organizations: No    Attends Banker Meetings: Patient unable to answer    Marital Status: Patient unable to answer   Past Medical History:  Past Medical History:  Diagnosis Date   Chronic back pain    Chronic kidney disease (CKD), stage II (mild)    Class I-II   Coronary artery disease 04/2009   50% stenosis in the perforator of LAD; catheterization was for an abnormal Myoview in January 2000 showing anterior and inferolateral ischemia.   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 > 3 subsequent Echos show  improved/normal EF with moderate to severe concentric LVH and diastolic dysfunction with LVOT/intracavitary gradient --> 06/2016: Severe LVH.  Vigorous EF, 65-70%.?? Gr 1 DD. Mild AS.   Hyperlipidemia    Hypertension    Hypertensive hypertrophic cardiomyopathy: NYHA class II:  Echo: Severe concentric LVH with LV OT gradient; essentially preserved EF with diastolic dysfunction 02/15/2013   Echo 06/2016: Severe Concentric LVH. Vigorous EF 65-70%. ~ Gr I DD.    Mild aortic stenosis by prior echocardiography    Echo 06/2016: Mild AS (Mean Gradient 15 mmHg); has had prior Mod-Severe MR (not seen on current echo)   PAD (peripheral artery disease) March 2013   Lower extremity Dopplers: R. SFA 50-60%, R. PTA proximally occluded with distal reconstitution;; L. common iliac ~50%, L. SFA 50-70% stenosis, L. PTA < 50%   Schizophrenia (HCC)     Past Surgical History:  Procedure Laterality Date   BUNIONECTOMY     carotid doppler  05/29/2011   left bulb/prox ICA moderate amtfibrous plaque with no evidence significant reduction.,right bulb /proximal ICA normal patency   lower extremity doppler  05/29/2011   right SFA 50% to 59% diameter reduction,right posterior tibal atreery occlusive disease,reconstituting distally, left common illiac<50%,left SFA 50 to70%,left post. tibial <50%   NM MYOCAR PERF WALL MOTION  03/2009   Persantine; EF 51%-both anterior and inferolateral ischemia   TRANSTHORACIC ECHOCARDIOGRAM  06/2016   Severe LVH.  Vigorous EF of 65-70%.  No RWMA. ~Only grade 1 diastolic dysfunction.  Mild aortic stenosis (mean gradient 15 mmHg)   TRANSTHORACIC ECHOCARDIOGRAM  07/2012   EF 50-55%; severe concentric LVH; only grade 1 diastolic dysfunction. Mild aortic sclerosis - with LVOT /intracavitary gradient of roughly 20 mmHg mean. Mild to moderately dilated LA;; previously reported MR not seen    Current Medications: Current Facility-Administered Medications  Medication Dose Route Frequency Provider  Last Rate Last Admin   acetaminophen  (TYLENOL ) tablet 650 mg  650 mg Oral Q6H PRN Coleman, Carolyn H, NP       alum & mag hydroxide-simeth (MAALOX/MYLANTA) 200-200-20 MG/5ML suspension 30 mL  30 mL Oral Q4H PRN Coleman, Carolyn H, NP       amLODipine  (NORVASC ) tablet 10 mg  10 mg Oral Daily Shinika Estelle R, MD   10 mg at 12/22/23 2041   aspirin  EC tablet 81 mg  81 mg Oral Daily Jadapalle, Sree, MD   81 mg at 12/23/23 9185   atorvastatin  (LIPITOR) tablet 40 mg  40 mg Oral QHS Jadapalle, Sree, MD   40 mg at 12/22/23 2111   carvedilol  (COREG ) tablet 3.125 mg  3.125 mg Oral BID WC Jadapalle, Sree, MD   3.125 mg at 12/23/23 9185   empagliflozin  (JARDIANCE ) tablet 10 mg  10 mg Oral Daily Jadapalle, Sree, MD   10 mg at 12/23/23 9185   ferrous sulfate  tablet 325 mg  325 mg Oral Q breakfast Jadapalle, Sree, MD       furosemide  (LASIX ) tablet 40 mg  40 mg Oral BID Jadapalle, Sree, MD   40 mg at 12/23/23 9185   hydrOXYzine  (ATARAX ) tablet 25 mg  25 mg Oral TID PRN Coleman, Carolyn H, NP       magnesium  hydroxide (MILK OF MAGNESIA) suspension 30 mL  30 mL Oral Daily PRN Coleman, Carolyn H, NP       OLANZapine  (ZYPREXA ) injection 5 mg  5 mg Intramuscular TID PRN Coleman, Carolyn H, NP       OLANZapine  (ZYPREXA ) tablet 5 mg  5 mg Oral QHS Coleman, Carolyn H, NP   5 mg at 12/22/23 2112   OLANZapine  zydis (ZYPREXA ) disintegrating tablet 5 mg  5 mg Oral TID PRN Mardy Elveria DEL, NP        Lab Results: No results found for this or any previous visit (from the past 48 hours).  Blood Alcohol level:  Lab Results  Component Value Date   [facility] <15 12/19/2023   ETH <10 01/03/2023    Metabolic Disorder Labs: Lab Results  Component Value Date   HGBA1C 5.8 (H) 06/10/2022   MPG 120 06/10/2022   MPG 102.54 05/13/2020   Lab Results  Component Value Date   PROLACTIN 27.3 (H) 12/18/2019   PROLACTIN 16.5 06/09/2016   Lab Results  Component Value Date   CHOL 147 03/27/2022   TRIG 89 03/27/2022   HDL 37 (L)  03/27/2022   CHOLHDL 4.0 03/27/2022   VLDL 18 03/27/2022   LDLCALC 92 03/27/2022   LDLCALC 101 (H) 02/15/2021    Physical Findings: AIMS:  , ,  ,  ,    CIWA:    COWS:      Psychiatric Specialty Exam:  Presentation  General Appearance:  Casual  Eye Contact: Fair  Speech: Normal Rate  Speech Volume: Normal    Mood and Affect  Mood: Anxious  Affect: Flat   Thought Process  Thought Processes: Coherent  Descriptions of Associations:Intact  Orientation:Full (Time, Place and Person)  Thought Content:-- (impoverished)  Hallucinations:No data recorded  Ideas of Reference:Paranoia  Suicidal Thoughts:No data recorded  Homicidal Thoughts:No data recorded   Sensorium  Memory: Immediate Fair; Recent Fair  Judgment: Impaired  Insight: Shallow   Executive Functions  Concentration: Fair  Attention Span: Fair  Recall: Poor  Fund of Knowledge: Poor  Language: Poor   Psychomotor Activity  Psychomotor Activity: No data recorded  Musculoskeletal: Strength & Muscle Tone: within normal limits Gait & Station: normal Assets  Assets: Manufacturing systems engineer; Desire for Improvement; Housing; Resilience    Physical Exam: Physical Exam Vitals and nursing note reviewed.    ROS Blood pressure (!) 157/94, pulse 83, temperature 98 F (36.7 C), temperature source Skin, resp. rate 16, height 5' 3 (1.6 m), weight 96.2 kg, last menstrual period 05/10/2013, SpO2 98%. Body mass index is 37.55 kg/m.  Diagnosis: Principal Problem:   Paranoid schizophrenia (HCC)  Clinical Decision Making: Patient with history of schizophrenia paranoid type is currently admitted for worsening psychosis, paranoia, physical and verbal aggression in the context of noncompliance with her medications.  Patient lacks insight into her mental health problems and continue to refuse assessments and medications.  Son reports being her legal guardian and approves nonemergent  forced medication.  Patient needs ongoing monitoring on the inpatient unit for further stabilization  Treatment Plan Summary:  Safety and Monitoring:             -- Voluntary admission to inpatient psychiatric unit for safety, stabilization and treatment             -- Daily contact with patient to assess and evaluate symptoms and progress in treatment             -- Patient's case to be discussed in multi-disciplinary team meeting             -- Observation Level: q15 minute checks             -- Vital signs:  q12 hours             -- Precautions: suicide, elopement, and assault   2. Psychiatric Diagnoses and Treatment:                Zyprexa  5 mg BID oral and if she refuses will initiate none emergent medication of zyprexa  5 mg bID IM once we verify the legal guardian ship papers.   -- The risks/benefits/side-effects/alternatives to this medication were discussed in detail with the patient and time was given for questions. The patient consents to medication trial.                -- Metabolic profile and EKG monitoring obtained while on an atypical antipsychotic (BMI: Lipid Panel: HbgA1c: QTc:)              -- Encouraged patient to participate in unit milieu and in scheduled group therapies                            3. Medical Issues Being Addressed:     4. Discharge Planning:   -- Social work and case management to assist with discharge planning and identification of hospital follow-up needs prior to discharge  -- Estimated LOS: 3-4 days  Erin JONELLE Manners, MD 12/23/2023, 12:22 PM

## 2023-12-23 NOTE — Plan of Care (Signed)
  Problem: Activity: Goal: Sleeping patterns will improve Outcome: Progressing   Problem: Coping: Goal: Coping ability will improve Outcome: Progressing

## 2023-12-23 NOTE — Plan of Care (Signed)
  Problem: Health Behavior/Discharge Planning: Goal: Compliance with treatment plan for underlying cause of condition will improve Outcome: Progressing   Problem: Safety: Goal: Ability to remain free from injury will improve Outcome: Progressing

## 2023-12-23 NOTE — Group Note (Signed)
 [facility] LCSW Group Therapy Note   Group Date: 12/23/2023 Start Time: 1430 End Time: 1530   Type of Therapy and Topic: Group Therapy: Avoiding Self-Sabotaging and Enabling Behaviors  Participation Level: Did Not Attend  Mood: X  Description of Group:  In this group, patients will learn how to identify obstacles, self-sabotaging and enabling behaviors, as well as: what are they, why do we do them and what needs these behaviors meet. Discuss unhealthy relationships and how to have positive healthy boundaries with those that sabotage and enable. Explore aspects of self-sabotage and enabling in yourself and how to limit these self-destructive behaviors in everyday life.   Therapeutic Goals: 1. Patient will identify one obstacle that relates to self-sabotage and enabling behaviors 2. Patient will identify one personal self-sabotaging or enabling behavior they did prior to admission 3. Patient will state a plan to change the above identified behavior 4. Patient will demonstrate ability to communicate their needs through discussion and/or role play.    Summary of Patient Progress:   X   Therapeutic Modalities:  Cognitive Behavioral Therapy Person-Centered Therapy Motivational Interviewing    [name], LCSWA
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
## 2023-12-23 NOTE — [facility] Suicide Risk Assessment (Signed)
 [facility] INPATIENT:  Family/Significant Other Suicide Prevention Education  Suicide Prevention Education:  Patient Refusal for Family/Significant Other Suicide Prevention Education: The patient Erin Riley has refused to provide written consent for family/significant other to be provided Family/Significant Other Suicide Prevention Education during admission and/or prior to discharge.  Physician notified.  Lum JONETTA Croft 12/23/2023, 1:47 PM

## 2023-12-23 NOTE — Progress Notes (Signed)
 Patient presents with animated mood this morning and pleasant at approach. Patient denies SI,HI ,and A/V/H this morning. Patient is med compliant with the exception of her iron  pill despite providing education. Patient reports no concerns thus far. Patient remains cooperative in milieu.

## 2023-12-23 NOTE — Group Note (Signed)
 Date:  12/23/2023 Time:  10:36 AM  Group Topic/Focus:  Coping With Mental Health Crisis:   The purpose of this group is to help patients identify strategies for coping with mental health crisis.  Group discusses possible causes of crisis and ways to manage them effectively.    Participation Level:  Did Not Attend  Leigh VEAR Pais 12/23/2023, 10:36 AM

## 2023-12-23 NOTE — Progress Notes (Signed)
   12/22/23 2245  Psych Admission Type (Psych Patients Only)  Admission Status Voluntary  Psychosocial Assessment  Patient Complaints None  Eye Contact Brief  Facial Expression Animated  Affect Apprehensive  Speech Incoherent;Soft  Interaction Guarded;Assertive  Motor Activity Other (Comment) (wnl)  Appearance/Hygiene In scrubs;Disheveled  Behavior Characteristics Cooperative;Guarded  Mood Suspicious  Thought Process  Coherency Disorganized;Circumstantial  Content Preoccupation  Delusions None reported or observed  Perception Hallucinations  Hallucination Auditory  Judgment Impaired  Confusion Moderate  Danger to Self  Current suicidal ideation? Denies  Self-Injurious Behavior No self-injurious ideation or behavior indicators observed or expressed   Agreement Not to Harm Self Yes  Description of Agreement verbal  Danger to Others  Danger to Others None reported or observed    Estimated Sleeping Duration (Last 24 Hours): 6.00-8.00 hours

## 2023-12-24 DIAGNOSIS — F200 Paranoid schizophrenia: Secondary | ICD-10-CM | POA: Diagnosis not present

## 2023-12-24 MED ORDER — OLANZAPINE 5 MG PO TABS
5.0000 mg | ORAL_TABLET | Freq: Two times a day (BID) | ORAL | Status: DC
Start: 2023-12-25 — End: 2023-12-26
  Administered 2023-12-25 – 2023-12-26 (×3): 5 mg via ORAL
  Filled 2023-12-24 (×3): qty 1

## 2023-12-24 NOTE — Group Note (Signed)
 Date:  12/24/2023 Time:  11:33 AM  Group Topic/Focus:  Managing Feelings:   The focus of this group is to identify what feelings patients have difficulty handling and develop a plan to handle them in a healthier way upon discharge.    Participation Level:  Did Not Attend  Harlene LITTIE Gavel 12/24/2023, 11:33 AM

## 2023-12-24 NOTE — Group Note (Signed)
 Date:  12/24/2023 Time:  5:45 PM  Group Topic/Focus:  Self Care:   The focus of this group is to help patients understand the importance of self-care in order to improve or restore emotional, physical, spiritual, interpersonal, and financial health.    Participation Level:  Minimal  Participation Quality:  Sharing  Affect:  Blunted  Cognitive:  Lacking  Insight: Improving  Engagement in Group:  Improving  Modes of Intervention:  Discussion  Additional Comments:    Garen CINDERELLA Daring 12/24/2023, 5:45 PM

## 2023-12-24 NOTE — Progress Notes (Signed)
 Behavior:  Cooperative.   Irritable with MHT early this morning about her door being left open.    Psych assessment:  Denies SI/HI and AVH.  Observed to be laughing inappropriately by herself. Appears irritated by assessment questions.   Interaction / Group attendance:  Isolates to room with the exception of meals. Minimal interaction with peers and staff.    Medication/ PRNs: Only declined ferrous sulfate .    Pain: Denies  15 min checks in place for safety.

## 2023-12-24 NOTE — Progress Notes (Signed)
 [facility] MD Progress Note  12/24/2023 11:38 PM Erin Riley  MRN:  [id number]  Patient is a 73 year old female presenting the ED  for concerns for hallucinations, believing that aliens are climbing trees in her backyard and have been implanting objects in her legs causing them to swell. Noted that they have also been stealing food from her. She also believes that she is 3 months pregnant. She is concerned for her leg swelling bilaterally which have been ongoing for many years. Patient is admitted to [facility] unit with Q15 min safety monitoring. Multidisciplinary team approach is offered. Medication management; group/milieu therapy is offered.   Subjective:  Chart reviewed, case discussed in multidisciplinary meeting, patient seen during rounds.  Per nursing patient was irritable during the morning time and had difficulty taking her medications.  Later in the afternoon patient is noted to be in the milieu having her lunch.  She offers no complaints.  She reports she is in a good mood.  She is agreeable for the team to talk to her son.  She is not endorsing auditory or visual hallucinations.  She is not endorsing SI/HI/plan. Sleep: Fair  Appetite:  Fair  Past Psychiatric History: see h&P Family History:  Family History  Problem Relation Age of Onset  . Hypertension Mother   . Breast cancer Neg Hx    Social History:  Social History   Substance and Sexual Activity  Alcohol Use No     Social History   Substance and Sexual Activity  Drug Use No    Social History   Socioeconomic History  . Marital status: Single    Spouse name: Not on file  . Number of children: 7  . Years of education: Not on file  . Highest education level: Not on file  Occupational History  . Not on file  Tobacco Use  . Smoking status: Some Days    Current packs/day: 0.00    Types: Cigarettes    Last attempt to quit: 05/14/2002    Years since quitting: 21.6  . Smokeless tobacco: Never  Vaping Use  . Vaping  status: Never Used  Substance and Sexual Activity  . Alcohol use: No  . Drug use: No  . Sexual activity: Yes    Birth control/protection: Post-menopausal, None  Other Topics Concern  . Not on file  Social History Narrative   Now single mother of 2 with one grandchild. She quit smoking roughly 5 years ago and is not so since. She has also stopped drinking alcohol. She does try get routine exercise walking at least a mile 3-4 days a week.    She lives with her 54 year old mother. 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   Social Drivers of Corporate investment banker Strain: Not on file  Food Insecurity: No Food Insecurity (12/20/2023)   Hunger Vital Sign   . Worried About Programme researcher, broadcasting/film/video in the Last Year: Never true   . Ran Out of Food in the Last Year: Never true  Transportation Needs: No Transportation Needs (12/20/2023)   PRAPARE - Transportation   . Lack of Transportation (Medical): No   . Lack of Transportation (Non-Medical): No  Physical Activity: Not on file  Stress: Not on file  Social Connections: Unknown (12/20/2023)   Social Connection and Isolation Panel   . Frequency of Communication with Friends and Family: Patient unable to answer   . Frequency of Social Gatherings with Friends and Family: Patient unable to answer   . Attends  Religious Services: Patient unable to answer   . Active Member of Clubs or Organizations: No   . Attends Banker Meetings: Patient unable to answer   . Marital Status: Patient unable to answer   Past Medical History:  Past Medical History:  Diagnosis Date  . Chronic back pain   . Chronic kidney disease (CKD), stage II (mild)    Class I-II  . Coronary artery disease 04/2009   50% stenosis in the perforator of LAD; catheterization was for an abnormal Myoview in January 2000 showing anterior and inferolateral ischemia.  . Diverticulitis   . 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 > 3 subsequent Echos show improved/normal EF with moderate to severe concentric LVH and diastolic dysfunction with LVOT/intracavitary gradient --> 06/2016: Severe LVH.  Vigorous EF, 65-70%.?? Gr 1 DD. Mild AS.  SABRA Hyperlipidemia   . Hypertension   . Hypertensive hypertrophic cardiomyopathy: NYHA class II:  Echo: Severe concentric LVH with LV OT gradient; essentially preserved EF with diastolic dysfunction 02/15/2013   Echo 06/2016: Severe Concentric LVH. Vigorous EF 65-70%. ~ Gr I DD.   . Mild aortic stenosis by prior echocardiography    Echo 06/2016: Mild AS (Mean Gradient 15 mmHg); has had prior Mod-Severe MR (not seen on current echo)  . PAD (peripheral artery disease) March 2013   Lower extremity Dopplers: R. SFA 50-60%, R. PTA proximally occluded with distal reconstitution;; L. common iliac ~50%, L. SFA 50-70% stenosis, L. PTA < 50%  . Schizophrenia [facility])     Past Surgical History:  Procedure Laterality Date  . BUNIONECTOMY    . carotid doppler  05/29/2011   left bulb/prox ICA moderate amtfibrous plaque with no evidence significant reduction.,right bulb /proximal ICA normal patency  . lower extremity doppler  05/29/2011   right SFA 50% to 59% diameter reduction,right posterior tibal atreery occlusive disease,reconstituting distally, left common illiac<50%,left SFA 50 to70%,left post. tibial <50%  . NM MYOCAR PERF WALL MOTION  03/2009   Persantine; EF 51%-both anterior and inferolateral ischemia  . TRANSTHORACIC ECHOCARDIOGRAM  06/2016   Severe LVH.  Vigorous EF of 65-70%.  No RWMA. ~Only grade 1 diastolic dysfunction.  Mild aortic stenosis (mean gradient 15 mmHg)  . TRANSTHORACIC ECHOCARDIOGRAM  07/2012   EF 50-55%; severe concentric LVH; only grade 1 diastolic dysfunction. Mild aortic sclerosis - with LVOT /intracavitary gradient of roughly 20 mmHg mean. Mild to moderately dilated LA;; previously reported MR not seen    Current  Medications: Current Facility-Administered Medications  Medication Dose Route Frequency Provider Last Rate Last Admin  . acetaminophen  (TYLENOL ) tablet 650 mg  650 mg Oral Q6H PRN Coleman, Carolyn H, NP      . alum & mag hydroxide-simeth (MAALOX/MYLANTA) 200-200-20 MG/5ML suspension 30 mL  30 mL Oral Q4H PRN Coleman, Carolyn H, NP      . amLODipine  (NORVASC ) tablet 10 mg  10 mg Oral Daily Madaram, Kondal R, MD   10 mg at 12/24/23 1950  . aspirin  EC tablet 81 mg  81 mg Oral Daily Micaylah Bertucci, MD   81 mg at 12/24/23 0840  . atorvastatin  (LIPITOR) tablet 40 mg  40 mg Oral QHS Varnell Donate, MD   40 mg at 12/24/23 2106  . carvedilol  (COREG ) tablet 3.125 mg  3.125 mg Oral BID WC Marlaine Arey, MD   3.125 mg at 12/24/23 1728  . empagliflozin  (JARDIANCE ) tablet 10 mg  10  mg Oral Daily Samani Deal, MD   10 mg at 12/24/23 9157  . ferrous sulfate  tablet 325 mg  325 mg Oral Q breakfast Breannah Kratt, MD   325 mg at 12/24/23 0840  . furosemide  (LASIX ) tablet 40 mg  40 mg Oral BID Salvador Bigbee, MD   40 mg at 12/24/23 1728  . hydrOXYzine  (ATARAX ) tablet 25 mg  25 mg Oral TID PRN Mardy Elveria DEL, NP      . magnesium  hydroxide (MILK OF MAGNESIA) suspension 30 mL  30 mL Oral Daily PRN Mardy Elveria DEL, NP      . OLANZapine  (ZYPREXA ) injection 5 mg  5 mg Intramuscular TID PRN Mardy Elveria DEL, NP      . NOREEN ON 12/25/2023] OLANZapine  (ZYPREXA ) tablet 5 mg  5 mg Oral BID Marselino Slayton, MD      . OLANZapine  zydis (ZYPREXA ) disintegrating tablet 5 mg  5 mg Oral TID PRN Mardy Elveria DEL, NP        Lab Results: No results found for this or any previous visit (from the past 48 hours).  Blood Alcohol level:  Lab Results  Component Value Date   [facility] <15 12/19/2023   ETH <10 01/03/2023    Metabolic Disorder Labs: Lab Results  Component Value Date   HGBA1C 5.8 (H) 06/10/2022   MPG 120 06/10/2022   MPG 102.54 05/13/2020   Lab Results  Component Value Date   PROLACTIN 27.3 (H)  12/18/2019   PROLACTIN 16.5 06/09/2016   Lab Results  Component Value Date   CHOL 147 03/27/2022   TRIG 89 03/27/2022   HDL 37 (L) 03/27/2022   CHOLHDL 4.0 03/27/2022   VLDL 18 03/27/2022   LDLCALC 92 03/27/2022   LDLCALC 101 (H) 02/15/2021    Physical Findings: AIMS:  , ,  ,  ,    CIWA:    COWS:      Psychiatric Specialty Exam:  Presentation  General Appearance:  Casual  Eye Contact: Fair  Speech: Normal Rate  Speech Volume: Normal    Mood and Affect  Mood: Anxious  Affect: Flat   Thought Process  Thought Processes: Coherent  Descriptions of Associations:Intact  Orientation:Full (Time, Place and Person)  Thought Content:-- (impoverished)  Hallucinations: Denies  Ideas of Reference:Paranoia  Suicidal Thoughts: Denies  Homicidal Thoughts: Denies   Sensorium  Memory: Immediate Fair; Recent Fair  Judgment: Impaired  Insight: Shallow   Executive Functions  Concentration: Fair  Attention Span: Fair  Recall: Poor  Fund of Knowledge: Poor  Language: Poor   Psychomotor Activity  Psychomotor Activity: No data recorded  Musculoskeletal: Strength & Muscle Tone: within normal limits Gait & Station: normal Assets  Assets: Manufacturing systems engineer; Desire for Improvement; Housing; Resilience    Physical Exam: Physical Exam Vitals and nursing note reviewed.    ROS Blood pressure (!) 114/102, pulse 76, temperature (!) 97 F (36.1 C), resp. rate 16, height 5' 3 (1.6 m), weight 96.2 kg, last menstrual period 05/10/2013, SpO2 100%. Body mass index is 37.55 kg/m.  Diagnosis: Principal Problem:   Paranoid schizophrenia (HCC)  Clinical Decision Making: Patient with history of schizophrenia paranoid type is currently admitted for worsening psychosis, paranoia, physical and verbal aggression in the context of noncompliance with her medications.    Treatment Plan Summary:  Safety and Monitoring:             -- Voluntary  admission to inpatient psychiatric unit for safety, stabilization and treatment             --  Daily contact with patient to assess and evaluate symptoms and progress in treatment             -- Patient's case to be discussed in multi-disciplinary team meeting             -- Observation Level: q15 minute checks             -- Vital signs:  q12 hours             -- Precautions: suicide, elopement, and assault   2. Psychiatric Diagnoses and Treatment:                Zyprexa  5 mg BID oral and if she refuses will initiate none emergent medication of zyprexa  5 mg bID IM once we verify the legal guardian ship papers.   -- The risks/benefits/side-effects/alternatives to this medication were discussed in detail with the patient and time was given for questions. The patient consents to medication trial.                -- Metabolic profile and EKG monitoring obtained while on an atypical antipsychotic (BMI: Lipid Panel: HbgA1c: QTc:)              -- Encouraged patient to participate in unit milieu and in scheduled group therapies                            3. Medical Issues Being Addressed:     4. Discharge Planning:   -- Social work and case management to assist with discharge planning and identification of hospital follow-up needs prior to discharge  -- Estimated LOS: 3-4 days  Allyn Foil, MD 12/24/2023, 11:38 PM

## 2023-12-24 NOTE — Plan of Care (Signed)
  Problem: Education: Goal: Mental status will improve Outcome: Not Progressing Goal: Verbalization of understanding the information provided will improve Outcome: Not Progressing   Problem: Activity: Goal: Interest or engagement in activities will improve Outcome: Not Progressing

## 2023-12-24 NOTE — Group Note (Unsigned)
 Date:  12/24/2023 Time:  5:35 PM  Group Topic/Focus:  Self Care:   The focus of this group is to help patients understand the importance of self-care in order to improve or restore emotional, physical, spiritual, interpersonal, and financial health.     Participation Level:  {BHH PARTICIPATION OZCZO:77735}  Participation Quality:  {BHH PARTICIPATION QUALITY:22265}  Affect:  {BHH AFFECT:22266}  Cognitive:  {BHH COGNITIVE:22267}  Insight: {BHH Insight2:20797}  Engagement in Group:  {BHH ENGAGEMENT IN HMNLE:77731}  Modes of Intervention:  {BHH MODES OF INTERVENTION:22269}  Additional Comments:  ***  Erin Riley 12/24/2023, 5:35 PM

## 2023-12-24 NOTE — Progress Notes (Signed)
   12/23/23 2230  Psych Admission Type (Psych Patients Only)  Admission Status Voluntary  Psychosocial Assessment  Patient Complaints None  Eye Contact Brief  Facial Expression Animated  Affect Appropriate to circumstance  Speech Soft  Interaction Guarded;Isolative  Motor Activity Slow  Appearance/Hygiene In scrubs  Behavior Characteristics Cooperative  Mood Pleasant  Thought Process  Coherency Circumstantial  Content WDL  Delusions None reported or observed  Perception WDL  Hallucination None reported or observed  Judgment Impaired  Confusion Moderate  Danger to Self  Current suicidal ideation? Denies  Self-Injurious Behavior No self-injurious ideation or behavior indicators observed or expressed   Agreement Not to Harm Self Yes  Description of Agreement Verbal  Danger to Others  Danger to Others None reported or observed

## 2023-12-24 NOTE — Progress Notes (Signed)
   12/24/23 1940  Psych Admission Type (Psych Patients Only)  Admission Status Voluntary  Psychosocial Assessment  Patient Complaints None  Eye Contact Fair  Facial Expression Animated  Affect Inconsistent with thought content  Speech Soft  Interaction Assertive  Motor Activity Slow  Appearance/Hygiene In scrubs  Behavior Characteristics Cooperative  Mood Pleasant  Aggressive Behavior  Effect No apparent injury  Thought Process  Coherency Disorganized  Content WDL  Delusions None reported or observed  Perception WDL  Hallucination None reported or observed  Judgment Impaired  Confusion Moderate  Danger to Self  Current suicidal ideation? Denies  Self-Injurious Behavior No self-injurious ideation or behavior indicators observed or expressed   Agreement Not to Harm Self Yes  Description of Agreement Verbal  Danger to Others  Danger to Others None reported or observed

## 2023-12-24 NOTE — Group Note (Signed)
 Recreation Therapy Group Note   Group Topic:Other  Group Date: 12/24/2023 Start Time: 1400 End Time: 1440 Facilitators: Celestia Jeoffrey BRAVO, LRT, CTRS Location: Dayroom  Activity Description/Intervention: Therapeutic Drumming. Patients with peers and staff were given the opportunity to engage in a leader facilitated [facility] Empowerment Drumming Circle with staff from the [employer], in partnership with [employer]. Teaching laboratory technician and trained [employer], Norleen Mon leading with LRT observing and documenting intervention and pt response. This evidenced-based practice targets 7 areas of health and wellbeing in the human experience including: stress-reduction, exercise, self-expression, camaraderie/support, nurturing, spirituality, and music-making (leisure).    Goal Area(s) Addresses:  Patient will engage in pro-social way in music group.  Patient will follow directions of drum leader on the first prompt. Patient will demonstrate no behavioral issues during group.  Patient will identify if a reduction in stress level occurs as a result of participation in therapeutic drum circle.      Affect/Mood: Full range   Participation Level: Minimal    Clinical Observations/Individualized Feedback: Erin Riley was present in group. Pt was talking to herself and laughing while in group. Pt sat away from the drumming circle.   Plan: Continue to engage patient in RT group sessions 2-3x/week.   Jeoffrey BRAVO Celestia, LRT, CTRS 12/24/2023 4:32 PM

## 2023-12-24 NOTE — Plan of Care (Signed)
  Problem: Activity: Goal: Interest or engagement in activities will improve Outcome: Progressing   Problem: Safety: Goal: Periods of time without injury will increase Outcome: Progressing

## 2023-12-25 DIAGNOSIS — F200 Paranoid schizophrenia: Secondary | ICD-10-CM | POA: Diagnosis not present

## 2023-12-25 NOTE — Group Note (Signed)
 Date:  12/25/2023 Time:  3:48 PM  Group Topic/Focus:  Pet Therapy:  Rollo the Pet Therapist arrived on our unit and decided to visit our patients today!  Rollo benefits all the patients with stress reduction and overall better mental health status.    Participation Level:  Did Not Attend   Camellia [name] Sharah Finnell 12/25/2023, 3:48 PM

## 2023-12-25 NOTE — Group Note (Signed)
 Date:  12/25/2023 Time:  10:39 AM  Group Topic/Focus:  Early Warning Signs:   The focus of this group is to help patients identify signs or symptoms they exhibit before slipping into an unhealthy state or crisis.    Participation Level:  Did Not Attend   Erin Riley Erin Riley 12/25/2023, 10:39 AM

## 2023-12-25 NOTE — Group Note (Signed)
 Date:  12/25/2023 Time:  11:25 PM  Group Topic/Focus:  Wrap-Up Group:   The focus of this group is to help patients review their daily goal of treatment and discuss progress on daily workbooks.    Participation Level:  Did Not Attend  Participation Quality:     Affect:     Cognitive:     Insight: None  Engagement in Group:  None  Modes of Intervention:     Additional Comments:    Tommas CHRISTELLA Bunker 12/25/2023, 11:25 PM

## 2023-12-25 NOTE — Plan of Care (Signed)
  Problem: Activity: Goal: Interest or engagement in activities will improve Outcome: Progressing   Problem: Coping: Goal: Ability to verbalize frustrations and anger appropriately will improve Outcome: Progressing Goal: Ability to demonstrate self-control will improve Outcome: Progressing   Problem: Education: Goal: Mental status will improve Outcome: Not Progressing

## 2023-12-25 NOTE — BHH Counselor (Signed)
 12/21/23- CSW contacted [place] [facility] [employer] office of special proceedings, as pt's son alleges that he is the legal guardian.  Special Proceedings of [place] reports there is NO legal guardian on file for pt.   CSW contacted pt's son via phone and asked about guardianship documentation which pt's son says he can provide a copy of.   12/21/23- CSW sent email to pt's son per his request to remind him to send the documentation after he had gotten off work.   CSW did not receive documentation.   12/24/23- CSW sent a follow-up email to pt's son to request the documentation again.   CSW has not received a response at this time.   Lum Croft, MSW, [place] 12/25/2023 10:25 AM

## 2023-12-25 NOTE — Group Note (Signed)
 Recreation Therapy Group Note   Group Topic:Animal Assisted Therapy   Group Date: 12/25/2023 Start Time: 1400 End Time: 1450 Facilitators: Celestia Jeoffrey BRAVO, LRT, CTRS Location: Courtyard  Group Description: AAA. Animal-Assisted Activity provides opportunities for motivational, educational, therapeutic and/or recreational benefits to enhance quality of life. Selinda and Rollo visited the unit to interact with patients.   Goal Areas Addressed:  Reduced anxiety and stress Improved mood Increased social interaction Enhanced communication skills Reduced loneliness and isolation Improved emotional regulation  Affect/Mood: N/A   Participation Level: Did not attend    Clinical Observations/Individualized Feedback: Patient did not attend group.   Plan: Continue to engage patient in RT group sessions 2-3x/week.   Jeoffrey BRAVO Celestia, LRT, CTRS 12/25/2023 4:12 PM

## 2023-12-25 NOTE — Progress Notes (Signed)
 [facility] MD Progress Note  12/25/2023 8:47 PM Erin Riley  MRN:  [id number]  Patient is a 73 year old female presenting the ED  for concerns for hallucinations, believing that aliens are climbing trees in her backyard and have been implanting objects in her legs causing them to swell. Noted that they have also been stealing food from her. She also believes that she is 3 months pregnant. She is concerned for her leg swelling bilaterally which have been ongoing for many years. Patient is admitted to [facility] unit with Q15 min safety monitoring. Multidisciplinary team approach is offered. Medication management; group/milieu therapy is offered.   Subjective:  Chart reviewed, case discussed in multidisciplinary meeting, patient seen during rounds.   Patient is noted to be resting in bed.  She reports that she is feeling better and started walking outside her room to go to the breakfast area.  Per nursing patient took her morning medications today.  Patient is known to be irritable when questions regarding her mental health are being asked.  She denies having any safety concerns.  She denies having any episodes of confusion or hallucinations.  When asked about her family member she denies anybody reaching out to her. Provider and patient social worker called patient's son twice and it went to voicemail.  Left a HIPAA call.  Voicemail.  Appetite:  Fair  Past Psychiatric History: see h&P Family History:  Family History  Problem Relation Age of Onset   Hypertension Mother    Breast cancer Neg Hx    Social History:  Social History   Substance and Sexual Activity  Alcohol Use No     Social History   Substance and Sexual Activity  Drug Use No    Social History   Socioeconomic History   Marital status: Single    Spouse name: Not on file   Number of children: 7   Years of education: Not on file   Highest education level: Not on file  Occupational History   Not on file  Tobacco Use   Smoking  status: Some Days    Current packs/day: 0.00    Types: Cigarettes    Last attempt to quit: 05/14/2002    Years since quitting: 21.6   Smokeless tobacco: Never  Vaping Use   Vaping status: Never Used  Substance and Sexual Activity   Alcohol use: No   Drug use: No   Sexual activity: Yes    Birth control/protection: Post-menopausal, None  Other Topics Concern   Not on file  Social History Narrative   Now single mother of 2 with one grandchild. She quit smoking roughly 5 years ago and is not so since. She has also stopped drinking alcohol. She does try get routine exercise walking at least a mile 3-4 days a week.    She lives with her 76 year old mother. 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   Social Drivers of Corporate investment banker Strain: Not on file  Food Insecurity: No Food Insecurity (12/20/2023)   Hunger Vital Sign    Worried About Running Out of Food in the Last Year: Never true    Ran Out of Food in the Last Year: Never true  Transportation Needs: No Transportation Needs (12/20/2023)   PRAPARE - Administrator, Civil Service (Medical): No    Lack of Transportation (Non-Medical): No  Physical Activity: Not on file  Stress: Not on file  Social Connections: Unknown (12/20/2023)   Social Connection and Isolation Panel  Frequency of Communication with Friends and Family: Patient unable to answer    Frequency of Social Gatherings with Friends and Family: Patient unable to answer    Attends Religious Services: Patient unable to answer    Active Member of Clubs or Organizations: No    Attends Banker Meetings: Patient unable to answer    Marital Status: Patient unable to answer   Past Medical History:  Past Medical History:  Diagnosis Date   Chronic back pain    Chronic kidney disease (CKD), stage II (mild)    Class I-II   Coronary artery disease 04/2009   50% stenosis in the perforator of LAD; catheterization was for an abnormal Myoview in  January 2000 showing anterior and inferolateral ischemia.   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 > 3 subsequent Echos show improved/normal EF with moderate to severe concentric LVH and diastolic dysfunction with LVOT/intracavitary gradient --> 06/2016: Severe LVH.  Vigorous EF, 65-70%.?? Gr 1 DD. Mild AS.   Hyperlipidemia    Hypertension    Hypertensive hypertrophic cardiomyopathy: NYHA class II:  Echo: Severe concentric LVH with LV OT gradient; essentially preserved EF with diastolic dysfunction 02/15/2013   Echo 06/2016: Severe Concentric LVH. Vigorous EF 65-70%. ~ Gr I DD.    Mild aortic stenosis by prior echocardiography    Echo 06/2016: Mild AS (Mean Gradient 15 mmHg); has had prior Mod-Severe MR (not seen on current echo)   PAD (peripheral artery disease) March 2013   Lower extremity Dopplers: R. SFA 50-60%, R. PTA proximally occluded with distal reconstitution;; L. common iliac ~50%, L. SFA 50-70% stenosis, L. PTA < 50%   Schizophrenia (HCC)     Past Surgical History:  Procedure Laterality Date   BUNIONECTOMY     carotid doppler  05/29/2011   left bulb/prox ICA moderate amtfibrous plaque with no evidence significant reduction.,right bulb /proximal ICA normal patency   lower extremity doppler  05/29/2011   right SFA 50% to 59% diameter reduction,right posterior tibal atreery occlusive disease,reconstituting distally, left common illiac<50%,left SFA 50 to70%,left post. tibial <50%   NM MYOCAR PERF WALL MOTION  03/2009   Persantine; EF 51%-both anterior and inferolateral ischemia   TRANSTHORACIC ECHOCARDIOGRAM  06/2016   Severe LVH.  Vigorous EF of 65-70%.  No RWMA. ~Only grade 1 diastolic dysfunction.  Mild aortic stenosis (mean gradient 15 mmHg)   TRANSTHORACIC ECHOCARDIOGRAM  07/2012   EF 50-55%; severe concentric LVH; only grade 1 diastolic dysfunction. Mild aortic  sclerosis - with LVOT /intracavitary gradient of roughly 20 mmHg mean. Mild to moderately dilated LA;; previously reported MR not seen    Current Medications: Current Facility-Administered Medications  Medication Dose Route Frequency Provider Last Rate Last Admin   acetaminophen  (TYLENOL ) tablet 650 mg  650 mg Oral Q6H PRN Coleman, Carolyn H, NP       alum & mag hydroxide-simeth (MAALOX/MYLANTA) 200-200-20 MG/5ML suspension 30 mL  30 mL Oral Q4H PRN Coleman, Carolyn H, NP       amLODipine  (NORVASC ) tablet 10 mg  10 mg Oral Daily Madaram, Kondal R, MD   10 mg at 12/24/23 1950   aspirin  EC tablet 81 mg  81 mg Oral Daily Dolph Tavano, MD   81 mg at 12/25/23 0847   atorvastatin  (LIPITOR) tablet 40 mg  40 mg Oral QHS Annaliyah Willig, MD   40 mg at 12/24/23 2106  carvedilol  (COREG ) tablet 3.125 mg  3.125 mg Oral BID WC Ader Fritze, MD   3.125 mg at 12/25/23 1657   empagliflozin  (JARDIANCE ) tablet 10 mg  10 mg Oral Daily Mayana Irigoyen, MD   10 mg at 12/25/23 0848   ferrous sulfate  tablet 325 mg  325 mg Oral Q breakfast Donnelly Mellow, MD   325 mg at 12/24/23 0840   furosemide  (LASIX ) tablet 40 mg  40 mg Oral BID Illyanna Petillo, MD   40 mg at 12/25/23 1658   hydrOXYzine  (ATARAX ) tablet 25 mg  25 mg Oral TID PRN Mardy Elveria DEL, NP       magnesium  hydroxide (MILK OF MAGNESIA) suspension 30 mL  30 mL Oral Daily PRN Coleman, Carolyn H, NP       OLANZapine  (ZYPREXA ) injection 5 mg  5 mg Intramuscular TID PRN Coleman, Carolyn H, NP       OLANZapine  (ZYPREXA ) tablet 5 mg  5 mg Oral BID Charliee Krenz, MD   5 mg at 12/25/23 0848   OLANZapine  zydis (ZYPREXA ) disintegrating tablet 5 mg  5 mg Oral TID PRN Mardy Elveria DEL, NP        Lab Results: No results found for this or any previous visit (from the past 48 hours).  Blood Alcohol level:  Lab Results  Component Value Date   [facility] <15 12/19/2023   ETH <10 01/03/2023    Metabolic Disorder Labs: Lab Results  Component Value Date    HGBA1C 5.8 (H) 06/10/2022   MPG 120 06/10/2022   MPG 102.54 05/13/2020   Lab Results  Component Value Date   PROLACTIN 27.3 (H) 12/18/2019   PROLACTIN 16.5 06/09/2016   Lab Results  Component Value Date   CHOL 147 03/27/2022   TRIG 89 03/27/2022   HDL 37 (L) 03/27/2022   CHOLHDL 4.0 03/27/2022   VLDL 18 03/27/2022   LDLCALC 92 03/27/2022   LDLCALC 101 (H) 02/15/2021    Physical Findings: AIMS:  , ,  ,  ,    CIWA:    COWS:      Psychiatric Specialty Exam:  Presentation  General Appearance:  Casual  Eye Contact: Fair  Speech: Normal Rate  Speech Volume: Normal    Mood and Affect  Mood: Anxious  Affect: Flat   Thought Process  Thought Processes: Coherent  Descriptions of Associations:Intact  Orientation:Full (Time, Place and Person)  Thought Content:-- (impoverished)  Hallucinations: Denies  Ideas of Reference:Paranoia  Suicidal Thoughts: Denies  Homicidal Thoughts: Denies   Sensorium  Memory: Immediate Fair; Recent Fair  Judgment: Impaired  Insight: Shallow   Executive Functions  Concentration: Fair  Attention Span: Fair  Recall: Poor  Fund of Knowledge: Poor  Language: Poor   Psychomotor Activity  Psychomotor Activity: No data recorded  Musculoskeletal: Strength & Muscle Tone: within normal limits Gait & Station: normal Assets  Assets: Manufacturing systems engineer; Desire for Improvement; Housing; Resilience    Physical Exam: Physical Exam Vitals and nursing note reviewed.    ROS Blood pressure (!) 137/91, pulse 77, temperature (!) 97.5 F (36.4 C), resp. rate 14, height 5' 3 (1.6 m), weight 96.2 kg, last menstrual period 05/10/2013, SpO2 100%. Body mass index is 37.55 kg/m.  Diagnosis: Principal Problem:   Paranoid schizophrenia (HCC)  Clinical Decision Making: Patient with history of schizophrenia paranoid type is currently admitted for worsening psychosis, paranoia, physical and verbal aggression in  the context of noncompliance with her medications.    Treatment Plan Summary:  Safety and Monitoring:             --  Voluntary admission to inpatient psychiatric unit for safety, stabilization and treatment             -- Daily contact with patient to assess and evaluate symptoms and progress in treatment             -- Patient's case to be discussed in multi-disciplinary team meeting             -- Observation Level: q15 minute checks             -- Vital signs:  q12 hours             -- Precautions: suicide, elopement, and assault   2. Psychiatric Diagnoses and Treatment:                Zyprexa  5 mg BID oral    -- The risks/benefits/side-effects/alternatives to this medication were discussed in detail with the patient and time was given for questions. The patient consents to medication trial.                -- Metabolic profile and EKG monitoring obtained while on an atypical antipsychotic (BMI: Lipid Panel: HbgA1c: QTc:)              -- Encouraged patient to participate in unit milieu and in scheduled group therapies                            3. Medical Issues Being Addressed:     4. Discharge Planning:   -- Social work and case management to assist with discharge planning and identification of hospital follow-up needs prior to discharge  -- Estimated LOS: 3-4 days  Allyn Foil, MD 12/25/2023, 8:47 PM

## 2023-12-25 NOTE — Progress Notes (Signed)
 Behavior:  Cooperative, but irritable.  They kept me up all night. You knew that.   Psych assessment:  Denies SI/HI and AVH.    Interaction / Group attendance:  Present in milieu for meals.  Minimal interaction with peers and staff.   Medication/ PRNs:  Declined ferrous sulfate .   Pain: Denies  15 min checks in place for safety.

## 2023-12-25 NOTE — Group Note (Signed)
 Date:  12/25/2023 Time:  11:10 PM  Group Topic/Focus:  Wrap-Up Group:   The focus of this group is to help patients review their daily goal of treatment and discuss progress on daily workbooks.    Participation Level:  Minimal  Participation Quality:  Attentive  Affect:  Appropriate  Cognitive:  Oriented  Insight: Limited  Engagement in Group:  Limited  Modes of Intervention:  Discussion  Additional Comments:    Erin Riley CHRISTELLA Bunker 12/25/2023, 11:10 PM

## 2023-12-25 NOTE — Group Note (Signed)
 [facility] LCSW Group Therapy Note   Group Date: 12/25/2023 Start Time: 1315 End Time: 1400   Type of Therapy/Topic:  Group Therapy:  Emotion Regulation  Participation Level:  Did Not Attend   Mood:  Description of Group:    The purpose of this group is to assist patients in learning to regulate negative emotions and experience positive emotions. Patients will be guided to discuss ways in which they have been vulnerable to their negative emotions. These vulnerabilities will be juxtaposed with experiences of positive emotions or situations, and patients challenged to use positive emotions to combat negative ones. Special emphasis will be placed on coping with negative emotions in conflict situations, and patients will process healthy conflict resolution skills.  Therapeutic Goals: Patient will identify two positive emotions or experiences to reflect on in order to balance out negative emotions:  Patient will label two or more emotions that they find the most difficult to experience:  Patient will be able to demonstrate positive conflict resolution skills through discussion or role plays:   Summary of Patient Progress:   X    Therapeutic Modalities:   Cognitive Behavioral Therapy Feelings Identification Dialectical Behavioral Therapy   Lum JONETTA Croft, LCSWA

## 2023-12-26 DIAGNOSIS — F200 Paranoid schizophrenia: Secondary | ICD-10-CM | POA: Diagnosis not present

## 2023-12-26 MED ORDER — OLANZAPINE 5 MG PO TABS
7.5000 mg | ORAL_TABLET | Freq: Every day | ORAL | Status: DC
  Administered 2023-12-27: 7.5 mg via ORAL
  Filled 2023-12-26: qty 2

## 2023-12-26 NOTE — Group Note (Signed)
 Date:  12/26/2023 Time:  4:25 PM  Group Topic/Focus:  Goals Group:   The focus of this group is to help patients establish daily goals to achieve during treatment and discuss how the patient can incorporate goal setting into their daily lives to aide in recovery.    Participation Level:  Active  Participation Quality:  Appropriate  Affect:  Appropriate  Cognitive:  Appropriate  Insight: Appropriate  Engagement in Group:  Engaged  Modes of Intervention:  Discussion   Erin Riley 12/26/2023, 4:25 PM

## 2023-12-26 NOTE — Progress Notes (Signed)
   12/26/23 0000  Psych Admission Type (Psych Patients Only)  Admission Status Voluntary  Psychosocial Assessment  Patient Complaints None  Eye Contact Fair  Facial Expression Animated  Affect Appropriate to circumstance  Speech Soft  Interaction Minimal  Motor Activity Slow  Appearance/Hygiene In scrubs  Behavior Characteristics Cooperative  Mood Pleasant  Thought Process  Coherency Disorganized  Content WDL  Delusions None reported or observed  Perception WDL  Hallucination None reported or observed  Judgment Impaired  Confusion Moderate  Danger to Self  Current suicidal ideation? Denies  Agreement Not to Harm Self Yes  Description of Agreement Verbalized  Danger to Others  Danger to Others None reported or observed

## 2023-12-26 NOTE — BH IP Treatment Plan (Signed)
 Interdisciplinary Treatment and Diagnostic Plan Update  12/26/2023 Time of Session: 2:00 PM  Erin Riley MRN: [id number]  Principal Diagnosis: Paranoid schizophrenia [facility])  Secondary Diagnoses: Principal Problem:   Paranoid schizophrenia (HCC)   Current Medications:  Current Facility-Administered Medications  Medication Dose Route Frequency Provider Last Rate Last Admin   acetaminophen  (TYLENOL ) tablet 650 mg  650 mg Oral Q6H PRN Coleman, Carolyn H, NP   650 mg at 12/26/23 0207   alum & mag hydroxide-simeth (MAALOX/MYLANTA) 200-200-20 MG/5ML suspension 30 mL  30 mL Oral Q4H PRN Coleman, Carolyn H, NP       amLODipine  (NORVASC ) tablet 10 mg  10 mg Oral Daily Madaram, Kondal R, MD   10 mg at 12/25/23 2057   aspirin  EC tablet 81 mg  81 mg Oral Daily Jadapalle, Sree, MD   81 mg at 12/26/23 0913   atorvastatin  (LIPITOR) tablet 40 mg  40 mg Oral QHS Jadapalle, Sree, MD   40 mg at 12/25/23 2057   carvedilol  (COREG ) tablet 3.125 mg  3.125 mg Oral BID WC Jadapalle, Sree, MD   3.125 mg at 12/26/23 9087   empagliflozin  (JARDIANCE ) tablet 10 mg  10 mg Oral Daily Jadapalle, Sree, MD   10 mg at 12/26/23 9087   ferrous sulfate  tablet 325 mg  325 mg Oral Q breakfast Jadapalle, Sree, MD   325 mg at 12/24/23 0840   furosemide  (LASIX ) tablet 40 mg  40 mg Oral BID Jadapalle, Sree, MD   40 mg at 12/26/23 9087   hydrOXYzine  (ATARAX ) tablet 25 mg  25 mg Oral TID PRN Coleman, Carolyn H, NP       magnesium  hydroxide (MILK OF MAGNESIA) suspension 30 mL  30 mL Oral Daily PRN Coleman, Carolyn H, NP       OLANZapine  (ZYPREXA ) injection 5 mg  5 mg Intramuscular TID PRN Mardy Elveria VEAR, NP       [START ON 12/27/2023] OLANZapine  (ZYPREXA ) tablet 7.5 mg  7.5 mg Oral QHS Jadapalle, Sree, MD       OLANZapine  zydis (ZYPREXA ) disintegrating tablet 5 mg  5 mg Oral TID PRN Coleman, Carolyn H, NP       PTA Medications: Medications Prior to Admission  Medication Sig Dispense Refill Last Dose/Taking   aspirin  EC 81 MG  tablet Take 1 tablet (81 mg total) by mouth daily. Swallow whole. 90 tablet 3    atorvastatin  (LIPITOR) 40 MG tablet Take 1 tablet (40 mg total) by mouth at bedtime. 90 tablet 3    carvedilol  (COREG ) 3.125 MG tablet Take 1 tablet (3.125 mg total) by mouth 2 (two) times daily with a meal. 180 tablet 3    empagliflozin  (JARDIANCE ) 10 MG TABS tablet Take 1 tablet (10 mg total) by mouth daily. 90 tablet 3    ferrous sulfate  325 (65 FE) MG tablet Take 1 tablet (325 mg total) by mouth daily with breakfast. 100 tablet 0    furosemide  (LASIX ) 40 MG tablet Take 80mg  in the morning, and 40mg  in the evening 180 tablet 3    OLANZapine  (ZYPREXA ) 5 MG tablet Take 5 mg by mouth at bedtime.      potassium chloride  (KLOR-CON  M) 10 MEQ tablet Take 3 tablets (30 mEq total) by mouth daily. 90 tablet 5    sacubitril-valsartan (ENTRESTO) 49-51 MG Take 1 tablet by mouth 2 (two) times daily. 60 tablet 5     Patient Stressors: Health problems    Patient Strengths: Communication skills   Treatment Modalities: Medication Management,  Group therapy, Case management,  1 to 1 session with clinician, Psychoeducation, Recreational therapy.   Physician Treatment Plan for Primary Diagnosis: Paranoid schizophrenia (HCC) Long Term Goal(s): Improvement in symptoms so as ready for discharge   Short Term Goals: Ability to identify changes in lifestyle to reduce recurrence of condition will improve Ability to verbalize feelings will improve Ability to disclose and discuss suicidal ideas Ability to demonstrate self-control will improve Ability to identify and develop effective coping behaviors will improve  Medication Management: Evaluate patient's response, side effects, and tolerance of medication regimen.  Therapeutic Interventions: 1 to 1 sessions, Unit Group sessions and Medication administration.  Evaluation of Outcomes: Progressing  Physician Treatment Plan for Secondary Diagnosis: Principal Problem:   Paranoid  schizophrenia (HCC)  Long Term Goal(s): Improvement in symptoms so as ready for discharge   Short Term Goals: Ability to identify changes in lifestyle to reduce recurrence of condition will improve Ability to verbalize feelings will improve Ability to disclose and discuss suicidal ideas Ability to demonstrate self-control will improve Ability to identify and develop effective coping behaviors will improve     Medication Management: Evaluate patient's response, side effects, and tolerance of medication regimen.  Therapeutic Interventions: 1 to 1 sessions, Unit Group sessions and Medication administration.  Evaluation of Outcomes: Progressing   RN Treatment Plan for Primary Diagnosis: Paranoid schizophrenia (HCC) Long Term Goal(s): Knowledge of disease and therapeutic regimen to maintain health will improve  Short Term Goals: Ability to remain free from injury will improve, Ability to verbalize frustration and anger appropriately will improve, Ability to demonstrate self-control, Ability to participate in decision making will improve, Ability to verbalize feelings will improve, Ability to disclose and discuss suicidal ideas, Ability to identify and develop effective coping behaviors will improve, and Compliance with prescribed medications will improve  Medication Management: RN will administer medications as ordered by provider, will assess and evaluate patient's response and provide education to patient for prescribed medication. RN will report any adverse and/or side effects to prescribing provider.  Therapeutic Interventions: 1 on 1 counseling sessions, Psychoeducation, Medication administration, Evaluate responses to treatment, Monitor vital signs and CBGs as ordered, Perform/monitor CIWA, COWS, AIMS and Fall Risk screenings as ordered, Perform wound care treatments as ordered.  Evaluation of Outcomes: Progressing   LCSW Treatment Plan for Primary Diagnosis: Paranoid schizophrenia  (HCC) Long Term Goal(s): Safe transition to appropriate next level of care at discharge, Engage patient in therapeutic group addressing interpersonal concerns.  Short Term Goals: Engage patient in aftercare planning with referrals and resources, Increase social support, Increase ability to appropriately verbalize feelings, Increase emotional regulation, Facilitate acceptance of mental health diagnosis and concerns, Facilitate patient progression through stages of change regarding substance use diagnoses and concerns, Identify triggers associated with mental health/substance abuse issues, and Increase skills for wellness and recovery  Therapeutic Interventions: Assess for all discharge needs, 1 to 1 time with Social worker, Explore available resources and support systems, Assess for adequacy in community support network, Educate family and significant other(s) on suicide prevention, Complete Psychosocial Assessment, Interpersonal group therapy.  Evaluation of Outcomes: Progressing   Progress in Treatment: Attending groups: Yes. and No. Participating in groups: Yes. and No. Taking medication as prescribed: Yes. Toleration medication: Yes. Family/Significant other contact made: No, will contact:  CSW to contact once permission is granted.  Patient understands diagnosis: Yes. Discussing patient identified problems/goals with staff: Yes. Medical problems stabilized or resolved: Yes. Denies suicidal/homicidal ideation: Yes. Issues/concerns per patient self-inventory: No. Other: None  New problem(s) identified: No, Describe:  None Update 12/26/23: No changes at this time    New Short Term/Long Term Goal(s):detox, elimination of symptoms of psychosis, medication management for mood stabilization; elimination of SI thoughts; development of comprehensive mental wellness/sobriety plan.  Update 12/26/23: No changes at this time    Patient Goals:  Work on my self-esteem. Update 12/26/23: No changes  at this time    Discharge Plan or Barriers: CSW to assist with the development of appropriate discharge plan.  Update 12/26/23: No changes at this time    Reason for Continuation of Hospitalization: Anxiety Depression Suicidal ideation   Estimated Length of Stay: 1 to 7 days. Update 12/26/23: TBD    Last 3 [place] Suicide Severity Risk Score: Flowsheet Row Admission (Current) from 12/19/2023 in [facility] BEHAVIORAL MEDICINE Most recent reading at 12/20/2023  1:00 AM ED from 12/19/2023 in [facility] Emergency Department at [facility] Most recent reading at 12/19/2023 10:01 AM ED to Hosp-Admission (Discharged) from 06/12/2023 in [facility] 3E HF PCU Most recent reading at 06/12/2023  6:50 PM  C-SSRS RISK CATEGORY No Risk No Risk No Risk    Last PHQ 2/9 Scores:    01/26/2021   10:37 AM 01/09/2018    4:02 PM 11/13/2017    9:11 AM  Depression screen PHQ 2/9  Decreased Interest 0 0 0  Down, Depressed, Hopeless 0 0 0  PHQ - 2 Score 0 0 0  Altered sleeping 0    Tired, decreased energy 0    Change in appetite 0    Feeling bad or failure about yourself  0    Trouble concentrating 0    Moving slowly or fidgety/restless 0    Suicidal thoughts 0    PHQ-9 Score 0    Difficult doing work/chores Not difficult at all      Scribe for Treatment Team: Lum JONETTA Croft, [place] 12/26/2023 3:42 PM

## 2023-12-26 NOTE — Group Note (Signed)
 Date:  12/26/2023 Time:  11:05 AM  Group Topic/Focus:  Making Healthy Choices:   The focus of this group is to help patients identify negative/unhealthy choices they were using prior to admission and identify positive/healthier coping strategies to replace them upon discharge.    Participation Level:  Did Not Attend  Arland Nutting 12/26/2023, 11:05 AM

## 2023-12-26 NOTE — Group Note (Signed)
 Date:  12/26/2023 Time:  8:39 PM  Group Topic/Focus:  Goals Group:   The focus of this group is to help patients establish daily goals to achieve during treatment and discuss how the patient can incorporate goal setting into their daily lives to aide in recovery.     Participation Level:  None  Participation Quality:    Affect:    Cognitive:     Insight: None  Engagement in Group:  None  Modes of Intervention:     Additional Comments:  did not attend  Sherrilyn JAYSON Redman 12/26/2023, 8:39 PM

## 2023-12-26 NOTE — Progress Notes (Signed)
   12/26/23 1100  Psych Admission Type (Psych Patients Only)  Admission Status Voluntary  Psychosocial Assessment  Patient Complaints None  Eye Contact Brief  Facial Expression Sad  Affect Sad  Speech Soft  Interaction Minimal  Motor Activity Slow  Appearance/Hygiene In scrubs  Behavior Characteristics Cooperative  Mood Pleasant  Thought Process  Coherency Disorganized  Content WDL  Delusions None reported or observed  Perception WDL  Hallucination None reported or observed  Judgment Impaired  Confusion Moderate  Danger to Self  Current suicidal ideation? Denies  Agreement Not to Harm Self Yes  Description of Agreement verbalized  Danger to Others  Danger to Others None reported or observed

## 2023-12-26 NOTE — Plan of Care (Signed)
  Problem: Activity: Goal: Interest or engagement in activities will improve Outcome: Not Progressing   Problem: Health Behavior/Discharge Planning: Goal: Compliance with treatment plan for underlying cause of condition will improve Outcome: Progressing   Problem: Health Behavior/Discharge Planning: Goal: Compliance with prescribed medication regimen will improve Outcome: Progressing   Problem: Role Relationship: Goal: Ability to interact with others will improve Outcome: Progressing   Problem: Safety: Goal: Ability to remain free from injury will improve Outcome: Progressing

## 2023-12-26 NOTE — Progress Notes (Signed)
 [facility] MD Progress Note  12/26/2023 10:55 AM Erin Riley  MRN:  [id number]  Patient is a 73 year old female presenting the ED  for concerns for hallucinations, believing that aliens are climbing trees in her backyard and have been implanting objects in her legs causing them to swell. Noted that they have also been stealing food from her. She also believes that she is 3 months pregnant. She is concerned for her leg swelling bilaterally which have been ongoing for many years. Patient is admitted to [facility] unit with Q15 min safety monitoring. Multidisciplinary team approach is offered. Medication management; group/milieu therapy is offered.   Subjective:  Chart reviewed, case discussed in multidisciplinary meeting, patient seen during rounds.  Today on interview patient is noted to be resting in bed.  She reports having pain in her legs and feeling tired.  Patient reports having a headache and taking Tylenol  later in the night.  Provider discussed switching Zyprexa  to night at the dose of 7.5 mg.  Patient denies auditory/visual hallucinations.  Patient denies feeling depressed or anxious.  Patient denies SI/HI/plan.  Per nursing patient is taking her medications with no problems and is intermittently participating in some groups.   Appetite:  Fair  Past Psychiatric History: see h&P Family History:  Family History  Problem Relation Age of Onset   Hypertension Mother    Breast cancer Neg Hx    Social History:  Social History   Substance and Sexual Activity  Alcohol Use No     Social History   Substance and Sexual Activity  Drug Use No    Social History   Socioeconomic History   Marital status: Single    Spouse name: Not on file   Number of children: 7   Years of education: Not on file   Highest education level: Not on file  Occupational History   Not on file  Tobacco Use   Smoking status: Some Days    Current packs/day: 0.00    Types: Cigarettes    Last attempt to quit:  05/14/2002    Years since quitting: 21.6   Smokeless tobacco: Never  Vaping Use   Vaping status: Never Used  Substance and Sexual Activity   Alcohol use: No   Drug use: No   Sexual activity: Yes    Birth control/protection: Post-menopausal, None  Other Topics Concern   Not on file  Social History Narrative   Now single mother of 2 with one grandchild. She quit smoking roughly 5 years ago and is not so since. She has also stopped drinking alcohol. She does try get routine exercise walking at least a mile 3-4 days a week.    She lives with her 72 year old mother. 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   Social Drivers of Corporate investment banker Strain: Not on file  Food Insecurity: No Food Insecurity (12/20/2023)   Hunger Vital Sign    Worried About Running Out of Food in the Last Year: Never true    Ran Out of Food in the Last Year: Never true  Transportation Needs: No Transportation Needs (12/20/2023)   PRAPARE - Administrator, Civil Service (Medical): No    Lack of Transportation (Non-Medical): No  Physical Activity: Not on file  Stress: Not on file  Social Connections: Unknown (12/20/2023)   Social Connection and Isolation Panel    Frequency of Communication with Friends and Family: Patient unable to answer    Frequency of Social Gatherings with Friends and  Family: Patient unable to answer    Attends Religious Services: Patient unable to answer    Active Member of Clubs or Organizations: No    Attends Banker Meetings: Patient unable to answer    Marital Status: Patient unable to answer   Past Medical History:  Past Medical History:  Diagnosis Date   Chronic back pain    Chronic kidney disease (CKD), stage II (mild)    Class I-II   Coronary artery disease 04/2009   50% stenosis in the perforator of LAD; catheterization was for an abnormal Myoview in January 2000 showing anterior and inferolateral ischemia.   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 > 3 subsequent Echos show improved/normal EF with moderate to severe concentric LVH and diastolic dysfunction with LVOT/intracavitary gradient --> 06/2016: Severe LVH.  Vigorous EF, 65-70%.?? Gr 1 DD. Mild AS.   Hyperlipidemia    Hypertension    Hypertensive hypertrophic cardiomyopathy: NYHA class II:  Echo: Severe concentric LVH with LV OT gradient; essentially preserved EF with diastolic dysfunction 02/15/2013   Echo 06/2016: Severe Concentric LVH. Vigorous EF 65-70%. ~ Gr I DD.    Mild aortic stenosis by prior echocardiography    Echo 06/2016: Mild AS (Mean Gradient 15 mmHg); has had prior Mod-Severe MR (not seen on current echo)   PAD (peripheral artery disease) March 2013   Lower extremity Dopplers: R. SFA 50-60%, R. PTA proximally occluded with distal reconstitution;; L. common iliac ~50%, L. SFA 50-70% stenosis, L. PTA < 50%   Schizophrenia (HCC)     Past Surgical History:  Procedure Laterality Date   BUNIONECTOMY     carotid doppler  05/29/2011   left bulb/prox ICA moderate amtfibrous plaque with no evidence significant reduction.,right bulb /proximal ICA normal patency   lower extremity doppler  05/29/2011   right SFA 50% to 59% diameter reduction,right posterior tibal atreery occlusive disease,reconstituting distally, left common illiac<50%,left SFA 50 to70%,left post. tibial <50%   NM MYOCAR PERF WALL MOTION  03/2009   Persantine; EF 51%-both anterior and inferolateral ischemia   TRANSTHORACIC ECHOCARDIOGRAM  06/2016   Severe LVH.  Vigorous EF of 65-70%.  No RWMA. ~Only grade 1 diastolic dysfunction.  Mild aortic stenosis (mean gradient 15 mmHg)   TRANSTHORACIC ECHOCARDIOGRAM  07/2012   EF 50-55%; severe concentric LVH; only grade 1 diastolic dysfunction. Mild aortic sclerosis - with LVOT /intracavitary gradient of roughly 20 mmHg mean. Mild to moderately dilated  LA;; previously reported MR not seen    Current Medications: Current Facility-Administered Medications  Medication Dose Route Frequency Provider Last Rate Last Admin   acetaminophen  (TYLENOL ) tablet 650 mg  650 mg Oral Q6H PRN Coleman, Carolyn H, NP   650 mg at 12/26/23 0207   alum & mag hydroxide-simeth (MAALOX/MYLANTA) 200-200-20 MG/5ML suspension 30 mL  30 mL Oral Q4H PRN Coleman, Carolyn H, NP       amLODipine  (NORVASC ) tablet 10 mg  10 mg Oral Daily Madaram, Kondal R, MD   10 mg at 12/25/23 2057   aspirin  EC tablet 81 mg  81 mg Oral Daily Makailyn Mccormick, MD   81 mg at 12/26/23 0913   atorvastatin  (LIPITOR) tablet 40 mg  40 mg Oral QHS Corrin Hingle, MD   40 mg at 12/25/23 2057   carvedilol  (COREG ) tablet 3.125 mg  3.125 mg Oral BID WC Tyeesha Riker, MD   3.125 mg  at 12/26/23 0912   empagliflozin  (JARDIANCE ) tablet 10 mg  10 mg Oral Daily Linc Renne, MD   10 mg at 12/26/23 9087   ferrous sulfate  tablet 325 mg  325 mg Oral Q breakfast Eilish Mcdaniel, MD   325 mg at 12/24/23 0840   furosemide  (LASIX ) tablet 40 mg  40 mg Oral BID Marin Wisner, MD   40 mg at 12/26/23 9087   hydrOXYzine  (ATARAX ) tablet 25 mg  25 mg Oral TID PRN Coleman, Carolyn H, NP       magnesium  hydroxide (MILK OF MAGNESIA) suspension 30 mL  30 mL Oral Daily PRN Mardy Elveria DEL, NP       OLANZapine  (ZYPREXA ) injection 5 mg  5 mg Intramuscular TID PRN Mardy Elveria DEL, NP       NOREEN ON 12/27/2023] OLANZapine  (ZYPREXA ) tablet 7.5 mg  7.5 mg Oral QHS Braidan Ricciardi, MD       OLANZapine  zydis (ZYPREXA ) disintegrating tablet 5 mg  5 mg Oral TID PRN Mardy Elveria DEL, NP        Lab Results: No results found for this or any previous visit (from the past 48 hours).  Blood Alcohol level:  Lab Results  Component Value Date   [facility] <15 12/19/2023   ETH <10 01/03/2023    Metabolic Disorder Labs: Lab Results  Component Value Date   HGBA1C 5.8 (H) 06/10/2022   MPG 120 06/10/2022   MPG 102.54 05/13/2020    Lab Results  Component Value Date   PROLACTIN 27.3 (H) 12/18/2019   PROLACTIN 16.5 06/09/2016   Lab Results  Component Value Date   CHOL 147 03/27/2022   TRIG 89 03/27/2022   HDL 37 (L) 03/27/2022   CHOLHDL 4.0 03/27/2022   VLDL 18 03/27/2022   LDLCALC 92 03/27/2022   LDLCALC 101 (H) 02/15/2021    Physical Findings: AIMS:  , ,  ,  ,    CIWA:    COWS:      Psychiatric Specialty Exam:  Presentation  General Appearance:  Casual  Eye Contact: Fair  Speech: Normal Rate  Speech Volume: Normal    Mood and Affect  Mood: Anxious  Affect: Flat   Thought Process  Thought Processes: Coherent  Descriptions of Associations:Intact  Orientation:Full (Time, Place and Person)  Thought Content:-- (impoverished)  Hallucinations: Denies  Ideas of Reference:Paranoia  Suicidal Thoughts: Denies  Homicidal Thoughts: Denies   Sensorium  Memory: Immediate Fair; Recent Fair  Judgment: Impaired  Insight: Shallow   Art therapist  Concentration: Fair  Attention Span: Fair  Recall: Improved and  Fund of Knowledge: Improved  Language: Fair   Lexicographer Activity: Normal  Musculoskeletal: Strength & Muscle Tone: within normal limits Gait & Station: normal Assets  Assets: Manufacturing systems engineer; Desire for Improvement; Housing; Resilience    Physical Exam: Physical Exam Vitals and nursing note reviewed.    ROS Blood pressure 100/74, pulse 75, temperature 98.3 F (36.8 C), resp. rate 17, height 5' 3 (1.6 m), weight 96.2 kg, last menstrual period 05/10/2013, SpO2 100%. Body mass index is 37.55 kg/m.  Diagnosis: Principal Problem:   Paranoid schizophrenia (HCC)  Clinical Decision Making: Patient with history of schizophrenia paranoid type is currently admitted for worsening psychosis, paranoia, physical and verbal aggression in the context of noncompliance with her medications.    Treatment Plan  Summary:  Safety and Monitoring:             -- Voluntary admission to inpatient psychiatric unit for safety,  stabilization and treatment             -- Daily contact with patient to assess and evaluate symptoms and progress in treatment             -- Patient's case to be discussed in multi-disciplinary team meeting             -- Observation Level: q15 minute checks             -- Vital signs:  q12 hours             -- Precautions: suicide, elopement, and assault   2. Psychiatric Diagnoses and Treatment:               Change Zyprexa  to 7.5 mg nightly as patient was reporting tiredness and muscle aches with 5 mg twice daily and also to minimize daytime sedation   -- The risks/benefits/side-effects/alternatives to this medication were discussed in detail with the patient and time was given for questions. The patient consents to medication trial.                -- Metabolic profile and EKG monitoring obtained while on an atypical antipsychotic (BMI: Lipid Panel: HbgA1c: QTc:)              -- Encouraged patient to participate in unit milieu and in scheduled group therapies                            3. Medical Issues Being Addressed:     4. Discharge Planning:   -- Social work and case management to assist with discharge planning and identification of hospital follow-up needs prior to discharge  -- Estimated LOS: 3-4 days  Allyn Foil, MD 12/26/2023, 10:55 AM

## 2023-12-26 NOTE — Plan of Care (Signed)
   Problem: Activity: Goal: Interest or engagement in activities will improve Outcome: Progressing Goal: Sleeping patterns will improve Outcome: Progressing

## 2023-12-26 NOTE — [facility] Group Notes (Signed)
 Spirituality Group   Description: Participant directed exploration of values, beliefs and meaning   **Focus on Gratitude: Invite reflection on sources of gratitude (external/internal); goal to invite internal gratitude to foster 1) reconnection with life-giving activities 2) self-compassion.   Following a brief framework of chaplain's role and ground rules of group behavior, participants are invited to share concerns or questions that engage spiritual life. Emphasis placed on common themes and shared experiences and ways to make meaning and clarify living into one's values.   Theory/Process/Goal: Utilize the theoretical framework of group therapy established by Celena Kite, Relational Cultural Theory and Rogerian approaches to facilitate relational empathy and use of the "here and now" to foster reflection, self-awareness, and sharing.   Observations: Erin Riley was an active participant in the group discussion. She shared that she enjoys connecting with others and hearing their stories.  Wiley Magan L. Delores [name].Div

## 2023-12-27 DIAGNOSIS — F200 Paranoid schizophrenia: Secondary | ICD-10-CM | POA: Diagnosis not present

## 2023-12-27 NOTE — Progress Notes (Signed)
   12/26/23 2134  Psych Admission Type (Psych Patients Only)  Admission Status Voluntary  Psychosocial Assessment  Patient Complaints None  Eye Contact Brief  Facial Expression Sad  Affect Sad  Speech Soft  Interaction Minimal  Motor Activity Slow  Appearance/Hygiene In scrubs  Behavior Characteristics Cooperative  Mood Pleasant  Thought Process  Coherency Disorganized  Content WDL  Delusions None reported or observed  Perception WDL  Hallucination None reported or observed  Judgment Impaired  Confusion Moderate  Danger to Self  Current suicidal ideation? Denies  Agreement Not to Harm Self Yes  Description of Agreement verbalized  Danger to Others  Danger to Others None reported or observed    Estimated Sleeping Duration (Last 24 Hours): 9.75-11.50 hours

## 2023-12-27 NOTE — Plan of Care (Signed)
   Problem: Education: Goal: Knowledge of [facility] Education information/materials will improve Outcome: Progressing Goal: Emotional status will improve Outcome: Progressing Goal: Mental status will improve Outcome: Progressing Goal: Verbalization of understanding the information provided will improve Outcome: Progressing

## 2023-12-27 NOTE — Group Note (Signed)
 Recreation Therapy Group Note   Group Topic:General Recreation  Group Date: 12/27/2023 Start Time: 1400 End Time: 1455 Facilitators: Celestia Jeoffrey BRAVO, LRT, CTRS Location: Courtyard  Group Description: Outdoor Recreation. Patients had the option to play corn hole, ring toss, UNO, or listening to music while outside in the courtyard getting fresh air and sunlight. Patients helped water  and prune the raised garden beds. LRT and patients discussed things that they enjoy doing in their free time outside of the hospital. LRT encouraged patients to drink water  after being active and getting their heart rate up.  Goal Area(s) Addressed: Patient will identify leisure interests.  Patient will practice healthy decision making. Patient will engage in recreation activity.   Affect/Mood: N/A   Participation Level: Did not attend    Clinical Observations/Individualized Feedback: Patient did not attend group.   Plan: Continue to engage patient in RT group sessions 2-3x/week.   Jeoffrey BRAVO Celestia, LRT, CTRS 12/27/2023 4:48 PM

## 2023-12-27 NOTE — Progress Notes (Signed)
 [facility] MD Progress Note  12/27/2023 12:16 PM Erin Riley  MRN:  [id number]  Patient is a 73 year old female presenting the ED  for concerns for hallucinations, believing that aliens are climbing trees in her backyard and have been implanting objects in her legs causing them to swell. Noted that they have also been stealing food from her. She also believes that she is 3 months pregnant. She is concerned for her leg swelling bilaterally which have been ongoing for many years. Patient is admitted to [facility] unit with Q15 min safety monitoring. Multidisciplinary team approach is offered. Medication management; group/milieu therapy is offered.   Subjective:  Chart reviewed, case discussed in multidisciplinary meeting, patient seen during rounds.   Patient is noted to be resting in her room.  She reports having fair appetite and sleeping the night.  Per nursing patient is more visible on the unit, participating in groups, coming out for her meals.  Patient is taking her medications and denies any side effects including EPS.  Patient denies any symptoms of depression or anxiety.  When asked about her family she reports that her children work Monday through Friday and she does not like to bother them.  When asked about transportation to home on discharge she informs the provider that sheriff brought her to the hospital and sheriff can transport her back to the home.  She does acknowledge having good support from her family.  She denies SI/HI/plan and denies auditory/visual hallucinations.  Appetite:  Fair  Past Psychiatric History: see h&P Family History:  Family History  Problem Relation Age of Onset   Hypertension Mother    Breast cancer Neg Hx    Social History:  Social History   Substance and Sexual Activity  Alcohol Use No     Social History   Substance and Sexual Activity  Drug Use No    Social History   Socioeconomic History   Marital status: Single    Spouse name: Not on file    Number of children: 7   Years of education: Not on file   Highest education level: Not on file  Occupational History   Not on file  Tobacco Use   Smoking status: Some Days    Current packs/day: 0.00    Types: Cigarettes    Last attempt to quit: 05/14/2002    Years since quitting: 21.6   Smokeless tobacco: Never  Vaping Use   Vaping status: Never Used  Substance and Sexual Activity   Alcohol use: No   Drug use: No   Sexual activity: Yes    Birth control/protection: Post-menopausal, None  Other Topics Concern   Not on file  Social History Narrative   Now single mother of 2 with one grandchild. She quit smoking roughly 5 years ago and is not so since. She has also stopped drinking alcohol. She does try get routine exercise walking at least a mile 3-4 days a week.    She lives with her 70 year old mother. 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   Social Drivers of Corporate investment banker Strain: Not on file  Food Insecurity: No Food Insecurity (12/20/2023)   Hunger Vital Sign    Worried About Running Out of Food in the Last Year: Never true    Ran Out of Food in the Last Year: Never true  Transportation Needs: No Transportation Needs (12/20/2023)   PRAPARE - Administrator, Civil Service (Medical): No    Lack of Transportation (Non-Medical): No  Physical Activity: Not on file  Stress: Not on file  Social Connections: Unknown (12/20/2023)   Social Connection and Isolation Panel    Frequency of Communication with Friends and Family: Patient unable to answer    Frequency of Social Gatherings with Friends and Family: Patient unable to answer    Attends Religious Services: Patient unable to answer    Active Member of Clubs or Organizations: No    Attends Banker Meetings: Patient unable to answer    Marital Status: Patient unable to answer   Past Medical History:  Past Medical History:  Diagnosis Date   Chronic back pain    Chronic kidney disease (CKD),  stage II (mild)    Class I-II   Coronary artery disease 04/2009   50% stenosis in the perforator of LAD; catheterization was for an abnormal Myoview in January 2000 showing anterior and inferolateral ischemia.   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 > 3 subsequent Echos show improved/normal EF with moderate to severe concentric LVH and diastolic dysfunction with LVOT/intracavitary gradient --> 06/2016: Severe LVH.  Vigorous EF, 65-70%.?? Gr 1 DD. Mild AS.   Hyperlipidemia    Hypertension    Hypertensive hypertrophic cardiomyopathy: NYHA class II:  Echo: Severe concentric LVH with LV OT gradient; essentially preserved EF with diastolic dysfunction 02/15/2013   Echo 06/2016: Severe Concentric LVH. Vigorous EF 65-70%. ~ Gr I DD.    Mild aortic stenosis by prior echocardiography    Echo 06/2016: Mild AS (Mean Gradient 15 mmHg); has had prior Mod-Severe MR (not seen on current echo)   PAD (peripheral artery disease) March 2013   Lower extremity Dopplers: R. SFA 50-60%, R. PTA proximally occluded with distal reconstitution;; L. common iliac ~50%, L. SFA 50-70% stenosis, L. PTA < 50%   Schizophrenia (HCC)     Past Surgical History:  Procedure Laterality Date   BUNIONECTOMY     carotid doppler  05/29/2011   left bulb/prox ICA moderate amtfibrous plaque with no evidence significant reduction.,right bulb /proximal ICA normal patency   lower extremity doppler  05/29/2011   right SFA 50% to 59% diameter reduction,right posterior tibal atreery occlusive disease,reconstituting distally, left common illiac<50%,left SFA 50 to70%,left post. tibial <50%   NM MYOCAR PERF WALL MOTION  03/2009   Persantine; EF 51%-both anterior and inferolateral ischemia   TRANSTHORACIC ECHOCARDIOGRAM  06/2016   Severe LVH.  Vigorous EF of 65-70%.  No RWMA. ~Only grade 1 diastolic dysfunction.  Mild aortic stenosis  (mean gradient 15 mmHg)   TRANSTHORACIC ECHOCARDIOGRAM  07/2012   EF 50-55%; severe concentric LVH; only grade 1 diastolic dysfunction. Mild aortic sclerosis - with LVOT /intracavitary gradient of roughly 20 mmHg mean. Mild to moderately dilated LA;; previously reported MR not seen    Current Medications: Current Facility-Administered Medications  Medication Dose Route Frequency Provider Last Rate Last Admin   acetaminophen  (TYLENOL ) tablet 650 mg  650 mg Oral Q6H PRN Coleman, Carolyn H, NP   650 mg at 12/27/23 0010   alum & mag hydroxide-simeth (MAALOX/MYLANTA) 200-200-20 MG/5ML suspension 30 mL  30 mL Oral Q4H PRN Coleman, Carolyn H, NP       amLODipine  (NORVASC ) tablet 10 mg  10 mg Oral Daily Madaram, Kondal R, MD   10 mg at 12/25/23 2057   aspirin  EC tablet 81 mg  81 mg Oral Daily Fidencio Duddy, MD   [phone]  mg at 12/27/23 0932   atorvastatin  (LIPITOR) tablet 40 mg  40 mg Oral QHS Emary Zalar, MD   40 mg at 12/26/23 2107   carvedilol  (COREG ) tablet 3.125 mg  3.125 mg Oral BID WC Vernice Mannina, MD   3.125 mg at 12/27/23 9066   empagliflozin  (JARDIANCE ) tablet 10 mg  10 mg Oral Daily Jorah Hua, MD   10 mg at 12/27/23 9066   ferrous sulfate  tablet 325 mg  325 mg Oral Q breakfast Tighe Gitto, MD   325 mg at 12/24/23 0840   furosemide  (LASIX ) tablet 40 mg  40 mg Oral BID Moody Robben, MD   40 mg at 12/27/23 0933   hydrOXYzine  (ATARAX ) tablet 25 mg  25 mg Oral TID PRN Coleman, Carolyn H, NP   25 mg at 12/26/23 2107   magnesium  hydroxide (MILK OF MAGNESIA) suspension 30 mL  30 mL Oral Daily PRN Coleman, Carolyn H, NP       OLANZapine  (ZYPREXA ) injection 5 mg  5 mg Intramuscular TID PRN Coleman, Carolyn H, NP       OLANZapine  (ZYPREXA ) tablet 7.5 mg  7.5 mg Oral QHS Ahyana Skillin, MD       OLANZapine  zydis (ZYPREXA ) disintegrating tablet 5 mg  5 mg Oral TID PRN Coleman, Carolyn H, NP   5 mg at 12/27/23 0022    Lab Results: No results found for this or any previous visit (from  the past 48 hours).  Blood Alcohol level:  Lab Results  Component Value Date   [facility] <15 12/19/2023   ETH <10 01/03/2023    Metabolic Disorder Labs: Lab Results  Component Value Date   HGBA1C 5.8 (H) 06/10/2022   MPG 120 06/10/2022   MPG 102.54 05/13/2020   Lab Results  Component Value Date   PROLACTIN 27.3 (H) 12/18/2019   PROLACTIN 16.5 06/09/2016   Lab Results  Component Value Date   CHOL 147 03/27/2022   TRIG 89 03/27/2022   HDL 37 (L) 03/27/2022   CHOLHDL 4.0 03/27/2022   VLDL 18 03/27/2022   LDLCALC 92 03/27/2022   LDLCALC 101 (H) 02/15/2021    Physical Findings: AIMS:  , ,  ,  ,    CIWA:    COWS:      Psychiatric Specialty Exam:  Presentation  General Appearance:  Casual  Eye Contact: Fair  Speech: Normal Rate  Speech Volume: Normal    Mood and Affect  Mood: Anxious  Affect: Flat   Thought Process  Thought Processes: Coherent  Descriptions of Associations:Intact  Orientation:Full (Time, Place and Person)  Thought Content:-- (impoverished)  Hallucinations: Denies  Ideas of Reference:Paranoia  Suicidal Thoughts: Denies  Homicidal Thoughts: Denies   Sensorium  Memory: Immediate Fair; Recent Fair  Judgment: Impaired  Insight: Shallow   Art therapist  Concentration: Fair  Attention Span: Fair  Recall: Improved and  Fund of Knowledge: Improved  Language: Fair   Lexicographer Activity: Normal  Musculoskeletal: Strength & Muscle Tone: within normal limits Gait & Station: normal Assets  Assets: Manufacturing systems engineer; Desire for Improvement; Housing; Resilience    Physical Exam: Physical Exam Vitals and nursing note reviewed.    ROS Blood pressure 130/82, pulse 72, temperature 98.2 F (36.8 C), resp. rate 17, height 5' 3 (1.6 m), weight 96.2 kg, last menstrual period 05/10/2013, SpO2 98%. Body mass index is 37.55 kg/m.  Diagnosis: Principal Problem:   Paranoid  schizophrenia (HCC)  Clinical Decision Making: Patient with history of schizophrenia paranoid type is  currently admitted for worsening psychosis, paranoia, physical and verbal aggression in the context of noncompliance with her medications.    Treatment Plan Summary:  Safety and Monitoring:             -- Voluntary admission to inpatient psychiatric unit for safety, stabilization and treatment             -- Daily contact with patient to assess and evaluate symptoms and progress in treatment             -- Patient's case to be discussed in multi-disciplinary team meeting             -- Observation Level: q15 minute checks             -- Vital signs:  q12 hours             -- Precautions: suicide, elopement, and assault   2. Psychiatric Diagnoses and Treatment:                Zyprexa   7.5 mg nightly as patient was reporting tiredness and muscle aches with 5 mg twice daily and also to minimize daytime sedation   -- The risks/benefits/side-effects/alternatives to this medication were discussed in detail with the patient and time was given for questions. The patient consents to medication trial.                -- Metabolic profile and EKG monitoring obtained while on an atypical antipsychotic (BMI: Lipid Panel: HbgA1c: QTc:)              -- Encouraged patient to participate in unit milieu and in scheduled group therapies                            3. Medical Issues Being Addressed:     4. Discharge Planning:   -- Social work and case management to assist with discharge planning and identification of hospital follow-up needs prior to discharge  -- Estimated LOS: 3-4 days  Allyn Foil, MD 12/27/2023, 12:16 PM

## 2023-12-27 NOTE — Progress Notes (Signed)
   12/27/23 1240  Spiritual Encounters  Type of Visit Initial  Care provided to: Patient  Referral source Chaplain assessment  Reason for visit Routine spiritual support   I met Ms Erin Riley in group on 10/22 but this was first one on one visit.  [name] welcomed my visit and we spoke in open area in front of nurse's station. Her affect was bright and she smiled, partly because she stated that I reminded her of someone that had been kind to her when she was young.  I greeted [name] and shared my appreciation for her smile. When I reminded her of my chaplain role she asked for prayer so with her consent I offered prayer.  Will recommend for ongoing support by Chaplain Rana as able.  Travis Purk L. Delores [name].Div

## 2023-12-27 NOTE — Group Note (Signed)
 Date:  12/27/2023 Time:  10:03 PM  Group Topic/Focus:  Identifying Needs:   The focus of this group is to help patients identify their personal needs that have been historically problematic and identify healthy behaviors to address their needs.    Participation Level:  Did Not Attend  Participation Quality:  Did Not Attend  Affect:  Did Not Attend  Cognitive:  Did Not Attend  Insight: None  Engagement in Group:  None  Modes of Intervention:  Did Not Attend  Additional Comments:    Laymon ONEIDA Finder 12/27/2023, 10:03 PM

## 2023-12-27 NOTE — Group Note (Signed)
 Date:  12/27/2023 Time:  11:07 AM  Group Topic/Focus:  Healthy Communication:   The focus of this group is to discuss communication, barriers to communication, as well as healthy ways to communicate with others.    Participation Level:  Did Not Attend   Arland Nutting 12/27/2023, 11:07 AM

## 2023-12-27 NOTE — Plan of Care (Signed)
  Problem: Activity: Goal: Sleeping patterns will improve Outcome: Progressing   Problem: Health Behavior/Discharge Planning: Goal: Compliance with treatment plan for underlying cause of condition will improve Outcome: Progressing   Problem: Coping: Goal: Coping ability will improve Outcome: Progressing

## 2023-12-27 NOTE — Progress Notes (Signed)
   12/27/23 1300  Psych Admission Type (Psych Patients Only)  Admission Status Voluntary  Psychosocial Assessment  Patient Complaints None  Eye Contact Brief  Facial Expression Sad  Affect Sad  Speech Soft  Interaction Minimal  Motor Activity Slow  Appearance/Hygiene In scrubs  Behavior Characteristics Cooperative  Mood Pleasant  Thought Process  Coherency Disorganized  Content WDL  Delusions None reported or observed  Perception WDL  Hallucination None reported or observed  Judgment Impaired  Confusion Moderate  Danger to Self  Current suicidal ideation? Denies  Agreement Not to Harm Self Yes  Description of Agreement verbalized  Danger to Others  Danger to Others None reported or observed

## 2023-12-27 NOTE — Group Note (Signed)
 Date:  12/27/2023 Time:  3:41 PM  Group Topic/Focus:  Overcoming Stress:   The focus of this group is to define stress and help patients assess their triggers.    Participation Level:  Did Not Attend    Erin Riley 12/27/2023, 3:41 PM

## 2023-12-28 DIAGNOSIS — F200 Paranoid schizophrenia: Secondary | ICD-10-CM | POA: Diagnosis not present

## 2023-12-28 MED ORDER — AMLODIPINE BESYLATE 10 MG PO TABS: 10.0000 mg | ORAL_TABLET | Freq: Every day | ORAL | 0 refills | Status: AC

## 2023-12-28 MED ORDER — OLANZAPINE 7.5 MG PO TABS: 7.5000 mg | ORAL_TABLET | Freq: Every day | ORAL | 0 refills | Status: DC

## 2023-12-28 MED ORDER — FUROSEMIDE 40 MG PO TABS: 40.0000 mg | ORAL_TABLET | Freq: Two times a day (BID) | ORAL | 0 refills | Status: DC

## 2023-12-28 NOTE — Care Management Important Message (Signed)
 Important Message  Patient Details  Name: Erin Riley MRN: [id number] Date of Birth: 1950-04-29   Important Message Given:  Yes - Medicare IM  Pt given blank IM as pt refused to sign IM upon admission.    [address], LCSWA 12/28/2023, 9:23 AM

## 2023-12-28 NOTE — BHH Suicide Risk Assessment (Signed)
 [facility] Discharge Suicide Risk Assessment   Principal Problem: Paranoid schizophrenia (HCC) Discharge Diagnoses: Principal Problem:   Paranoid schizophrenia (HCC)   Total Time spent with patient: 30 minutes  Musculoskeletal: Strength & Muscle Tone: within normal limits Gait & Station: normal Patient leans: N/A  Psychiatric Specialty Exam  Presentation  General Appearance:  Appropriate for Environment  Eye Contact: Fair  Speech: Clear and Coherent  Speech Volume: Normal  Handedness: Right   Mood and Affect  Mood: Anxious  Duration of Depression Symptoms: No data recorded Affect: Appropriate   Thought Process  Thought Processes: Coherent  Descriptions of Associations:Intact  Orientation:Full (Time, Place and Person)  Thought Content:Logical  History of Schizophrenia/Schizoaffective disorder:Yes  Duration of Psychotic Symptoms:Greater than six months  Hallucinations:Hallucinations: None  Ideas of Reference:None  Suicidal Thoughts:Suicidal Thoughts: No  Homicidal Thoughts:Homicidal Thoughts: No   Sensorium  Memory: Immediate Fair; Remote Fair  Judgment: Fair  Insight: Fair   Art therapist  Concentration: Fair  Attention Span: Fair  Recall: [employer] of Knowledge: Fair  Language: Fair   Psychomotor Activity  Psychomotor Activity: Psychomotor Activity: Normal   Assets  Assets: Communication Skills; Physical Health; Resilience   Sleep  Sleep: Sleep: Fair  Estimated Sleeping Duration (Last 24 Hours): 12.75-14.75 hours  Physical Exam: Physical Exam ROS Blood pressure 135/85, pulse 66, temperature (!) 97.1 F (36.2 C), resp. rate 18, height 5' 3 (1.6 m), weight 96.2 kg, last menstrual period 05/10/2013, SpO2 98%. Body mass index is 37.55 kg/m.  Mental Status Per Nursing Assessment::   On Admission:  NA  Demographic Factors:  Low socioeconomic status  Loss Factors: Decrease in vocational  status  Historical Factors: Impulsivity  Risk Reduction Factors:   Living with another person, especially a relative, Positive social support, Positive therapeutic relationship, and Positive coping skills or problem solving skills  Continued Clinical Symptoms:  Schizophrenia:   Paranoid or undifferentiated type  Cognitive Features That Contribute To Risk:  None    Suicide Risk:  Minimal: No identifiable suicidal ideation.  Patients presenting with no risk factors but with morbid ruminations; may be classified as minimal risk based on the severity of the depressive symptoms   Follow-up Information     [facility] Go to.   Why: Although you declined scheduled follow-up, I am putting walk-in hours for [facility] in [place] should you feel you need further assistance.   Monday-Friday, 8 a.m. - 5 p.m. Office closed for lunch, 12 p.m. - 1 p.m. The last [facility] walk-in will be taken at 3 p.m. each day Contact information: [address]  [address] [place] [address] [phone]                 Plan Of Care/Follow-up recommendations:  Activity:  AS tolerated  Allyn Foil, MD 12/28/2023, 9:32 AM

## 2023-12-28 NOTE — Group Note (Signed)
 Date:  12/28/2023 Time:  11:06 AM  Group Topic/Focus:  Self Care:   The focus of this group is to help patients understand the importance of self-care in order to improve or restore emotional, physical, spiritual, interpersonal, and financial health.    Participation Level:  Did Not Attend     Erin Riley Gavel 12/28/2023, 11:06 AM

## 2023-12-28 NOTE — Discharge Summary (Signed)
 Physician Discharge Summary Note  Patient:  Erin Riley is an 73 y.o., female MRN:  [id number] DOB:  04-28-1950 Patient phone:  [phone] (home)  Patient address:   [address] [place] [address],   Total time spent: 40 min Date of Admission:  12/19/2023 Date of Discharge: 12/28/23  Reason for Admission:  Patient is a 73 year old female presenting the ED  for concerns for hallucinations, believing that aliens are climbing trees in her backyard and have been implanting objects in her legs causing them to swell. Noted that they have also been stealing food from her. She also believes that she is 3 months pregnant. She is concerned for her leg swelling bilaterally which have been ongoing for many years. Patient is admitted to [facility] unit with Q15 min safety monitoring. Multidisciplinary team approach is offered. Medication management; group/milieu therapy is offered.   Principal Problem: Paranoid schizophrenia (HCC) Discharge Diagnoses: Principal Problem:   Paranoid schizophrenia (HCC)   Past Psychiatric History: see h&p  Family Psychiatric  History: see h&p Social History:  Social History   Substance and Sexual Activity  Alcohol Use No     Social History   Substance and Sexual Activity  Drug Use No    Social History   Socioeconomic History   Marital status: Single    Spouse name: Not on file   Number of children: 7   Years of education: Not on file   Highest education level: Not on file  Occupational History   Not on file  Tobacco Use   Smoking status: Some Days    Current packs/day: 0.00    Types: Cigarettes    Last attempt to quit: 05/14/2002    Years since quitting: 21.6   Smokeless tobacco: Never  Vaping Use   Vaping status: Never Used  Substance and Sexual Activity   Alcohol use: No   Drug use: No   Sexual activity: Yes    Birth control/protection: Post-menopausal, None  Other Topics Concern   Not on file  Social History Narrative   Now single  mother of 2 with one grandchild. She quit smoking roughly 5 years ago and is not so since. She has also stopped drinking alcohol. She does try get routine exercise walking at least a mile 3-4 days a week.    She lives with her 62 year old mother. 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   Social Drivers of Corporate investment banker Strain: Not on file  Food Insecurity: No Food Insecurity (12/20/2023)   Hunger Vital Sign    Worried About Running Out of Food in the Last Year: Never true    Ran Out of Food in the Last Year: Never true  Transportation Needs: No Transportation Needs (12/20/2023)   PRAPARE - Administrator, Civil Service (Medical): No    Lack of Transportation (Non-Medical): No  Physical Activity: Not on file  Stress: Not on file  Social Connections: Unknown (12/20/2023)   Social Connection and Isolation Panel    Frequency of Communication with Friends and Family: Patient unable to answer    Frequency of Social Gatherings with Friends and Family: Patient unable to answer    Attends Religious Services: Patient unable to answer    Active Member of Clubs or Organizations: No    Attends Banker Meetings: Patient unable to answer    Marital Status: Patient unable to answer   Past Medical History:  Past Medical History:  Diagnosis Date   Chronic back  pain    Chronic kidney disease (CKD), stage II (mild)    Class I-II   Coronary artery disease 04/2009   50% stenosis in the perforator of LAD; catheterization was for an abnormal Myoview in January 2000 showing anterior and inferolateral ischemia.   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 > 3 subsequent Echos show improved/normal EF with moderate to severe concentric LVH and diastolic dysfunction with LVOT/intracavitary gradient --> 06/2016: Severe LVH.  Vigorous EF, 65-70%.?? Gr 1 DD. Mild AS.    Hyperlipidemia    Hypertension    Hypertensive hypertrophic cardiomyopathy: NYHA class II:  Echo: Severe concentric LVH with LV OT gradient; essentially preserved EF with diastolic dysfunction 02/15/2013   Echo 06/2016: Severe Concentric LVH. Vigorous EF 65-70%. ~ Gr I DD.    Mild aortic stenosis by prior echocardiography    Echo 06/2016: Mild AS (Mean Gradient 15 mmHg); has had prior Mod-Severe MR (not seen on current echo)   PAD (peripheral artery disease) March 2013   Lower extremity Dopplers: R. SFA 50-60%, R. PTA proximally occluded with distal reconstitution;; L. common iliac ~50%, L. SFA 50-70% stenosis, L. PTA < 50%   Schizophrenia (HCC)     Past Surgical History:  Procedure Laterality Date   BUNIONECTOMY     carotid doppler  05/29/2011   left bulb/prox ICA moderate amtfibrous plaque with no evidence significant reduction.,right bulb /proximal ICA normal patency   lower extremity doppler  05/29/2011   right SFA 50% to 59% diameter reduction,right posterior tibal atreery occlusive disease,reconstituting distally, left common illiac<50%,left SFA 50 to70%,left post. tibial <50%   NM MYOCAR PERF WALL MOTION  03/2009   Persantine; EF 51%-both anterior and inferolateral ischemia   TRANSTHORACIC ECHOCARDIOGRAM  06/2016   Severe LVH.  Vigorous EF of 65-70%.  No RWMA. ~Only grade 1 diastolic dysfunction.  Mild aortic stenosis (mean gradient 15 mmHg)   TRANSTHORACIC ECHOCARDIOGRAM  07/2012   EF 50-55%; severe concentric LVH; only grade 1 diastolic dysfunction. Mild aortic sclerosis - with LVOT /intracavitary gradient of roughly 20 mmHg mean. Mild to moderately dilated LA;; previously reported MR not seen   Family History:  Family History  Problem Relation Age of Onset   Hypertension Mother    Breast cancer Neg Hx     Hospital Course:  Patient is a 73 year old female presenting the ED  for concerns for hallucinations, believing that aliens are climbing trees in her backyard and have been  implanting objects in her legs causing them to swell. Noted that they have also been stealing food from her. She also believes that she is 3 months pregnant. She is concerned for her leg swelling bilaterally which have been ongoing for many years. Patient is admitted to [facility] unit with Q15 min safety monitoring. Multidisciplinary team approach is offered. Medication management; group/milieu therapy is offered.  Detailed risk assessment is complete based on clinical exam and individual risk factors and acute suicide risk is low and acute violence risk is low.    On admission, patient was started on Zyprexa  5 mg nightly.  Patient was initially psychotic and hostile but eventually calmed down and started taking her medications.  It was clarified that patient is her own guardian and son is not her guardian.  Zyprexa  was eventually titrated to 5 mg twice daily which made her more drowsy during the daytime.  Dosage of Zyprexa  was finally  adjusted to 7.5 mg nightly.  Patient tolerated the dosage very well with significant improvement in psychosis and mood.  She was participating in her ADLs and also treatment team/plan.  She is noted to be more visible on the unit and participating in groups.  On the day of discharge she denies SI/HI/plan and consistently denies auditory/visual hallucinations. Currently, all modifiable risk of harm to self/harm to others have been addressed and patient is no longer appropriate for the acute inpatient setting and is able to continue treatment for mental health needs in the community with the supports as indicated below.  Patient is educated and verbalized understanding of discharge plan of care including medications, follow-up appointments, mental health resources and further crisis services in the community.  He is instructed to call 911 or present to the nearest emergency room should he experience any decompensation in mood, disturbance of bowel or return of suicidal/homicidal  ideations.  Patient verbalizes understanding of this education and agrees to this plan of care  Physical Findings: AIMS:  , ,  ,  ,    CIWA:    COWS:        Psychiatric Specialty Exam:  Presentation  General Appearance:  Appropriate for Environment  Eye Contact: Fair  Speech: Clear and Coherent  Speech Volume: Normal    Mood and Affect  Mood: Anxious  Affect: Appropriate   Thought Process  Thought Processes: Coherent  Descriptions of Associations:Intact  Orientation:Full (Time, Place and Person)  Thought Content:Logical  Hallucinations:Hallucinations: None  Ideas of Reference:None  Suicidal Thoughts:Suicidal Thoughts: No  Homicidal Thoughts:Homicidal Thoughts: No   Sensorium  Memory: Immediate Fair; Remote Fair  Judgment: Fair  Insight: Fair   Art therapist  Concentration: Fair  Attention Span: Fair  Recall: [employer] of Knowledge: Fair  Language: Fair   Psychomotor Activity  Psychomotor Activity: Psychomotor Activity: Normal  Musculoskeletal: Strength & Muscle Tone: within normal limits Gait & Station: normal Assets  Assets: Manufacturing systems engineer; Physical Health; Resilience   Sleep  Sleep: Sleep: Fair    Physical Exam: Physical Exam Vitals and nursing note reviewed.    ROS Blood pressure 135/85, pulse 66, temperature (!) 97.1 F (36.2 C), resp. rate 18, height 5' 3 (1.6 m), weight 96.2 kg, last menstrual period 05/10/2013, SpO2 98%. Body mass index is 37.55 kg/m.   Social History   Tobacco Use  Smoking Status Some Days   Current packs/day: 0.00   Types: Cigarettes   Last attempt to quit: 05/14/2002   Years since quitting: 21.6  Smokeless Tobacco Never   Tobacco Cessation:  N/A, patient does not currently use tobacco products   Blood Alcohol level:  Lab Results  Component Value Date   [facility] <15 12/19/2023   ETH <10 01/03/2023    Metabolic Disorder Labs:  Lab Results  Component  Value Date   HGBA1C 5.8 (H) 06/10/2022   MPG 120 06/10/2022   MPG 102.54 05/13/2020   Lab Results  Component Value Date   PROLACTIN 27.3 (H) 12/18/2019   PROLACTIN 16.5 06/09/2016   Lab Results  Component Value Date   CHOL 147 03/27/2022   TRIG 89 03/27/2022   HDL 37 (L) 03/27/2022   CHOLHDL 4.0 03/27/2022   VLDL 18 03/27/2022   LDLCALC 92 03/27/2022   LDLCALC 101 (H) 02/15/2021    See Psychiatric Specialty Exam and Suicide Risk Assessment completed by Attending Physician prior to discharge.  Discharge destination:  Home  Is patient on multiple antipsychotic therapies at discharge:  No  Has Patient had three or more failed trials of antipsychotic monotherapy by history:  No  Recommended Plan for Multiple Antipsychotic Therapies: NA   Allergies as of 12/28/2023   No Known Allergies      Medication List     TAKE these medications      Indication  amLODipine  10 MG tablet Commonly known as: NORVASC  Take 1 tablet (10 mg total) by mouth daily.  Indication: High Blood Pressure   aspirin  EC 81 MG tablet Take 1 tablet (81 mg total) by mouth daily. Swallow whole.    atorvastatin  40 MG tablet Commonly known as: LIPITOR Take 1 tablet (40 mg total) by mouth at bedtime.  Indication: High Amount of Fats in the Blood   carvedilol  3.125 MG tablet Commonly known as: COREG  Take 1 tablet (3.125 mg total) by mouth 2 (two) times daily with a meal.    empagliflozin  10 MG Tabs tablet Commonly known as: JARDIANCE  Take 1 tablet (10 mg total) by mouth daily.    FeroSul 325 (65 Fe) MG tablet Generic drug: ferrous sulfate  Take 1 tablet (325 mg total) by mouth daily with breakfast.    furosemide  40 MG tablet Commonly known as: LASIX  Take 1 tablet (40 mg total) by mouth 2 (two) times daily. What changed:  how much to take how to take this when to take this additional instructions  Indication: Heart Failure, High Blood Pressure   OLANZapine  7.5 MG tablet Commonly known  as: ZYPREXA  Take 1 tablet (7.5 mg total) by mouth at bedtime. What changed:  medication strength how much to take    potassium chloride  10 MEQ tablet Commonly known as: KLOR-CON  M Take 3 tablets (30 mEq total) by mouth daily.  Indication: Low Amount of Potassium in the Blood   sacubitril-valsartan 49-51 MG Commonly known as: Entresto Take 1 tablet by mouth 2 (two) times daily.         Follow-up Information     [facility]. Go to.   Why: Although you declined scheduled follow-up, I am putting walk-in hours for [facility] in [place] should you feel you need further assistance.   Monday-Friday, 8 a.m. - 5 p.m. Office closed for lunch, 12 p.m. - 1 p.m. The last Open Access walk-in will be taken at 3 p.m. each day Contact information: [address]  [address] [place] [address] [phone]                 Follow-up recommendations:  Activity:  As tolerated    Signed: Efton Thomley, MD 12/28/2023, 9:33 AM

## 2023-12-28 NOTE — Plan of Care (Signed)
  Problem: Education: Goal: Emotional status will improve Outcome: Progressing Goal: Mental status will improve Outcome: Progressing   Problem: Activity: Goal: Sleeping patterns will improve Outcome: Progressing   Problem: Coping: Goal: Ability to verbalize frustrations and anger appropriately will improve Outcome: Progressing Goal: Ability to demonstrate self-control will improve Outcome: Progressing

## 2023-12-28 NOTE — Progress Notes (Signed)
   12/28/23 0128  Psych Admission Type (Psych Patients Only)  Admission Status Voluntary  Psychosocial Assessment  Patient Complaints None  Eye Contact Brief  Facial Expression Animated  Affect Sad  Speech Soft  Interaction Minimal  Motor Activity Slow  Appearance/Hygiene In scrubs  Behavior Characteristics Cooperative  Mood Pleasant  Thought Process  Coherency Disorganized  Content WDL  Delusions None reported or observed  Perception WDL  Hallucination None reported or observed  Judgment Impaired  Confusion Moderate  Danger to Self  Current suicidal ideation? Denies  Self-Injurious Behavior No self-injurious ideation or behavior indicators observed or expressed   Agreement Not to Harm Self Yes  Description of Agreement verbal  Danger to Others  Danger to Others None reported or observed    Estimated Sleeping Duration (Last 24 Hours): 11.75-14.50 hours

## 2023-12-28 NOTE — Plan of Care (Addendum)
 Patient to be discharged today back to home.  Belongings from locker obtained and patient changed into her own clothes. Long sleeve shirt obtained and provided to patient.  Will continue to monitor.  Patient escorted to the exit by [facility]HT to the awaiting cab with belongings and discharge paperwork.

## 2023-12-28 NOTE — Progress Notes (Signed)
  [facility] Adult Case Management Discharge Plan :  Will you be returning to the same living situation after discharge:  Yes,  pt will return home  At discharge, do you have transportation home?: Yes,  CSW will assist with transportation  Do you have the ability to pay for your medications:  [employer] MEDICARE / DREMA DUAL COMPLETE   Release of information consent forms completed and in the chart;  Patient's signature needed at discharge.  Patient to Follow up at:  Follow-up Information     [facility]. Go to.   Why: Although you declined scheduled follow-up, I am putting walk-in hours for [facility] in [place] should you feel you need further assistance.   Monday-Friday, 8 a.m. - 5 p.m. Office closed for lunch, 12 p.m. - 1 p.m. The last Open Access walk-in will be taken at 3 p.m. each day Contact information: [address]  [address] [place] [address] [phone]                 Next level of care provider has access to [facility] Link:no  Safety Planning and Suicide Prevention discussed: Yes,  SPE gone over with pt      Has patient been referred to the Quitline?: Patient refused referral for treatment  Patient has been referred for addiction treatment: No known substance use disorder.  [address] LCSWA 12/28/2023, 9:21 AM

## 2023-12-31 ENCOUNTER — Telehealth: Payer: Self-pay | Admitting: *Deleted

## 2023-12-31 NOTE — Transitions of Care (Post Inpatient/ED Visit) (Signed)
   12/31/2023  Name: Erin Riley MRN: [id number] DOB: October 31, 1950  Today's TOC FU Call Status: Today's TOC FU Call Status:: Unsuccessful Call (1st Attempt) Unsuccessful Call (1st Attempt) Date: 12/31/23  Attempted to reach the patient regarding the most recent Inpatient/ED visit.  Follow Up Plan: Additional outreach attempts will be made to reach the patient to complete the Transitions of Care (Post Inpatient/ED visit) call.   Andrea Dimes RN, BSN [place]  [facility] Health RN Care Manager [phone]

## 2024-01-01 ENCOUNTER — Telehealth: Payer: Self-pay | Admitting: *Deleted

## 2024-01-01 NOTE — Transitions of Care (Post Inpatient/ED Visit) (Signed)
   01/01/2024  Name: Erin Riley MRN: [id number] DOB: 30-Apr-1950  Today's TOC FU Call Status: Today's TOC FU Call Status:: Unsuccessful Call (2nd Attempt) Unsuccessful Call (2nd Attempt) Date: 01/01/24  Attempted to reach the patient regarding the most recent Inpatient/ED visit.  Follow Up Plan: Additional outreach attempts will be made to reach the patient to complete the Transitions of Care (Post Inpatient/ED visit) call.   Andrea Dimes RN, BSN [place]  [facility]Based [facility] Health RN Care Manager [phone]

## 2024-01-01 NOTE — Transitions of Care (Post Inpatient/ED Visit) (Signed)
   01/01/2024  Name: Erin Riley MRN: [id number] DOB: Sep 23, 1950  RNCM returning call to patient's DPR/[name]. RNCM will attempt to reach DPR on 01/02/24.  Andrea Dimes RN, BSN [place]  Value[facility]Based Care Institute [facility] Health RN Care Manager [phone]

## 2024-01-02 ENCOUNTER — Telehealth: Payer: Self-pay | Admitting: *Deleted

## 2024-01-02 NOTE — Transitions of Care (Post Inpatient/ED Visit) (Signed)
   01/02/2024  Name: Erin Riley MRN: [id number] DOB: [birth year]  Today's TOC FU Call Status: Today's TOC FU Call Status:: Unsuccessful Call (3rd Attempt) Unsuccessful Call (3rd Attempt) Date: 01/02/24  Attempted to reach the patient regarding the most recent Inpatient/ED visit.  Follow Up Plan: No further outreach attempts will be made at this time. We have been unable to contact the patient.  Andrea Dimes RN, BSN [place]  Value[facility]Based Care Institute [facility] Health RN Care Manager [phone]

## 2024-01-07 NOTE — Progress Notes (Unsigned)
 Patient ID: Erin Riley MRN: [id number] DOB/AGE: June 13, 1950 73 y.o.  Primary Care Physician:Newlin, Corrina, MD Primary Cardiologist: Rolan  CC:  Aortic valvular disease management     FOCUSED PROBLEM LIST:   Aortic stenosis AVA 0.95, MG 33, V-max 3.8, EF 25 to 30% TTE April 2025 EKG normal sinus rhythm with IVCD Nonischemic cardiomyopathy EF 25 to 30% TTE April 2025 Nonobstructive disease coronary angiography 2011 Hypertension Hyperlipidemia Aortic atherosclerosis Chest CT 2024 CKD stage IIIb Peripheral arterial disease Thoracoabdominal aortic aneurysm Followed by vascular surgery Schizophrenia BMI 34  November 2025:  Patient consents to use of AI scribe. The patient is a 73 year old female followed closely by the advanced heart failure section.  She has a long history of a nonischemic cardiomyopathy and underwent coronary angiography in 2011 which showed mild nonobstructive disease.  She admitted in April of this year with acute systolic heart failure due to uncontrolled blood pressure.  Of note a cardiac MRI was not pursued due to her psychiatric issues.  Her goal-directed medical therapy was optimized and she was discharged home.  She was seen in follow-up in October.  She was doing well and reported NYHA class II symptoms.  At that visit her Jardiance  was restarted and her losartan  was switched to Entresto.  The patient is here with her son.  It is clear the patient has little insight into her cardiovascular issues.  She denies that she has a history of paranoid schizophrenia.  In terms of cardiovascular symptoms, she denies any shortness of breath, chest pain, palpitations, paroxysmal atrial dyspnea, orthopnea.  Her son tells me that she has not been taking her medications and continues to smoke.  When I asked her about her diagnosis of schizophrenia, she became very angry and defensive.         Past Medical History:  Diagnosis Date   Chronic back pain    Chronic  kidney disease (CKD), stage II (mild)    Class I-II   Coronary artery disease 04/2009   50% stenosis in the perforator of LAD; catheterization was for an abnormal Myoview in January 2000 showing anterior and inferolateral ischemia.   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 > 3 subsequent Echos show improved/normal EF with moderate to severe concentric LVH and diastolic dysfunction with LVOT/intracavitary gradient --> 06/2016: Severe LVH.  Vigorous EF, 65-70%.?? Gr 1 DD. Mild AS.   Hyperlipidemia    Hypertension    Hypertensive hypertrophic cardiomyopathy: NYHA class II:  Echo: Severe concentric LVH with LV OT gradient; essentially preserved EF with diastolic dysfunction 02/15/2013   Echo 06/2016: Severe Concentric LVH. Vigorous EF 65-70%. ~ Gr I DD.    Mild aortic stenosis by prior echocardiography    Echo 06/2016: Mild AS (Mean Gradient 15 mmHg); has had prior Mod-Severe MR (not seen on current echo)   PAD (peripheral artery disease) March 2013   Lower extremity Dopplers: R. SFA 50-60%, R. PTA proximally occluded with distal reconstitution;; L. common iliac ~50%, L. SFA 50-70% stenosis, L. PTA < 50%   Schizophrenia (HCC)     Past Surgical History:  Procedure Laterality Date   BUNIONECTOMY     carotid doppler  05/29/2011   left bulb/prox ICA moderate amtfibrous plaque with no evidence significant reduction.,right bulb /proximal ICA normal patency   lower extremity doppler  05/29/2011   right SFA 50% to 59% diameter reduction,right posterior tibal  atreery occlusive disease,reconstituting distally, left common illiac<50%,left SFA 50 to70%,left post. tibial <50%   NM MYOCAR PERF WALL MOTION  03/2009   Persantine; EF 51%-both anterior and inferolateral ischemia   TRANSTHORACIC ECHOCARDIOGRAM  06/2016   Severe LVH.  Vigorous EF of 65-70%.  No RWMA. ~Only grade 1 diastolic dysfunction.   Mild aortic stenosis (mean gradient 15 mmHg)   TRANSTHORACIC ECHOCARDIOGRAM  07/2012   EF 50-55%; severe concentric LVH; only grade 1 diastolic dysfunction. Mild aortic sclerosis - with LVOT /intracavitary gradient of roughly 20 mmHg mean. Mild to moderately dilated LA;; previously reported MR not seen    Family History  Problem Relation Age of Onset   Hypertension Mother    Breast cancer Neg Hx     Social History   Socioeconomic History   Marital status: Single    Spouse name: Not on file   Number of children: 7   Years of education: Not on file   Highest education level: Not on file  Occupational History   Not on file  Tobacco Use   Smoking status: Some Days    Current packs/day: 0.00    Types: Cigarettes    Last attempt to quit: 05/14/2002    Years since quitting: 21.6   Smokeless tobacco: Never  Vaping Use   Vaping status: Never Used  Substance and Sexual Activity   Alcohol use: No   Drug use: No   Sexual activity: Yes    Birth control/protection: Post-menopausal, None  Other Topics Concern   Not on file  Social History Narrative   Now single mother of 2 with one grandchild. She quit smoking roughly 5 years ago and is not so since. She has also stopped drinking alcohol. She does try get routine exercise walking at least a mile 3-4 days a week.    She lives with her 50 year old mother. 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   Social Drivers of Corporate Investment Banker Strain: Not on file  Food Insecurity: No Food Insecurity (12/20/2023)   Hunger Vital Sign    Worried About Running Out of Food in the Last Year: Never true    Ran Out of Food in the Last Year: Never true  Transportation Needs: No Transportation Needs (12/20/2023)   PRAPARE - Administrator, Civil Service (Medical): No    Lack of Transportation (Non-Medical): No  Physical Activity: Not on file  Stress: Not on file  Social Connections: Unknown (12/20/2023)   Social Connection and Isolation  Panel    Frequency of Communication with Friends and Family: Patient unable to answer    Frequency of Social Gatherings with Friends and Family: Patient unable to answer    Attends Religious Services: Patient unable to answer    Active Member of Clubs or Organizations: No    Attends Banker Meetings: Patient unable to answer    Marital Status: Patient unable to answer  Intimate Partner Violence: Not At Risk (12/20/2023)   Humiliation, Afraid, Rape, and Kick questionnaire    Fear of Current or Ex-Partner: No    Emotionally Abused: No    Physically Abused: No    Sexually Abused: No     Prior to Admission medications   Medication Sig Start Date End Date Taking? Authorizing Provider  amLODipine  (NORVASC ) 10 MG tablet Take 1 tablet (10 mg total) by mouth daily. 12/28/23   Donnelly Mellow, MD  aspirin  EC 81 MG tablet Take 1 tablet (81 mg total) by mouth daily. Swallow  whole. 08/02/23   Milford, Harlene [name], FNP  atorvastatin  (LIPITOR) 40 MG tablet Take 1 tablet (40 mg total) by mouth at bedtime. 08/02/23   Glena Harlene [name], FNP  carvedilol  (COREG ) 3.125 MG tablet Take 1 tablet (3.125 mg total) by mouth 2 (two) times daily with a meal. 08/02/23   Milford, Harlene [name], FNP  empagliflozin  (JARDIANCE ) 10 MG TABS tablet Take 1 tablet (10 mg total) by mouth daily. 08/02/23   Glena Harlene [name], FNP  ferrous sulfate  325 (65 FE) MG tablet Take 1 tablet (325 mg total) by mouth daily with breakfast. 09/14/22   Singh, Prashant K, MD  furosemide  (LASIX ) 40 MG tablet Take 1 tablet (40 mg total) by mouth 2 (two) times daily. 12/28/23   Donnelly Mellow, MD  OLANZapine  (ZYPREXA ) 7.5 MG tablet Take 1 tablet (7.5 mg total) by mouth at bedtime. 12/28/23   Donnelly Mellow, MD  potassium chloride  (KLOR-CON  M) 10 MEQ tablet Take 3 tablets (30 mEq total) by mouth daily. 08/21/23   Marcine Catalan M, PA-C  sacubitril-valsartan (ENTRESTO) 49-51 MG Take 1 tablet by mouth 2 (two) times daily. 12/17/23   Lee,  Jordan, NP    No Known Allergies  REVIEW OF SYSTEMS:  General: no fevers/chills/night sweats Eyes: no blurry vision, diplopia, or amaurosis ENT: no sore throat or hearing loss Resp: no cough, wheezing, or hemoptysis CV: no edema or palpitations GI: no abdominal pain, nausea, vomiting, diarrhea, or constipation GU: no dysuria, frequency, or hematuria Skin: no rash Neuro: no headache, numbness, tingling, or weakness of extremities Musculoskeletal: no joint pain or swelling Heme: no bleeding, DVT, or easy bruising Endo: no polydipsia or polyuria  LMP 05/10/2013   PHYSICAL EXAM: GEN:  AO x 3 in no acute distress HEENT: normal Dentition: Normal Neck: JVP normal. +2carotid upstrokes without bruits. No thyromegaly. Lungs: equal expansion, clear bilaterally CV: Apex is discrete and nondisplaced, RRR with very soft systolic murmur Abd: soft, non-tender, non-distended; no bruit; positive bowel sounds Ext: no edema, ecchymoses, or cyanosis Vascular: 2+ femoral pulses, 2+ radial pulses       Skin: warm and dry without rash Neuro: CN II-XII grossly intact; motor and sensory grossly intact    DATA AND STUDIES:  EKG: 2025 normal sinus rhythm with IVCD  EKG Interpretation Date/Time:    Ventricular Rate:    PR Interval:    QRS Duration:    QT Interval:    QTC Calculation:   R Axis:      Text Interpretation:          CARDIAC STUDIES: Refer to CV Procedures and Imaging Tabs  06/19/2023: Magnesium  2.2 11/13/2023: B Natriuretic Peptide 1,625.5 12/19/2023: ALT 13; BUN 25; Creatinine, Ser 1.71; Hemoglobin 10.8; Platelets 271; Potassium 3.7; Sodium 136   STS RISK CALCULATOR: Pending  NYHA CLASS: 1    ASSESSMENT AND PLAN:   1. Nonrheumatic aortic valve stenosis   2. NICM (nonischemic cardiomyopathy) (HCC)   3. Essential hypertension   4. Hyperlipidemia LDL goal <70   5. Aortic atherosclerosis   6. Stage 3b chronic kidney disease (HCC)   7. PAD (peripheral artery  disease) (HCC)   8. Schizophrenia, disorganized, chronic with acute exacerbation (HCC)   9. BMI 34.0-34.9,adult     Aortic stenosis: The patient has developed low-flow low gradient aortic stenosis.  She denies any symptoms.  Given her dense schizophrenia I do not think she is a candidate for transcatheter aortic valve replacement as she is unable to comply with medical recommendations.  The  patient will follow-up with heart failure division in January. Nonischemic cardiomyopathy: Coronary angiography in 2000 Levan with mild nonobstructive disease.  Currently managed by the advanced heart failure division.  Continue Coreg  3.125 mg twice daily, Jardiance  10 mg daily, Entresto 49 x 51 mg twice daily, Lasix  40 mg twice daily. Hypertension: Continue amlodipine  10 mg, Coreg  3.125 mg twice daily, Entresto 49 x 51 twice daily. 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  Followed by vascular surgery. Schizophrenia: This seems to me to be a limitation to intervention of any kind including SAVR or TAVR. Elevated BMI: Continue diet and exercise modification.   I have personally reviewed the patients imaging data as summarized above.  I have reviewed the natural history of aortic stenosis with the patient and family members who are present today. We have discussed the limitations of medical therapy and the poor prognosis associated with symptomatic aortic stenosis. We have also reviewed potential treatment options, including palliative medical therapy, conventional surgical aortic valve replacement, and transcatheter aortic valve replacement. We discussed treatment options in the context of this patient's specific comorbid medical conditions.   All of the  patient's questions were answered today. Will make further recommendations based on the results of studies outlined above.   I spent 45 minutes reviewing all clinical data during and prior to this visit including all relevant imaging studies, laboratories, clinical information from other health systems and prior notes from both Cardiology and other specialties, interviewing the patient, conducting a complete physical examination, and coordinating care in order to formulate a comprehensive and personalized evaluation and treatment plan.   Sandrine Bloodsworth K Dash Cardarelli, MD  01/07/2024 6:19 PM    [facility] [address] [place], [place], [place]  [address] Phone: [phone]; Fax: [phone]

## 2024-01-09 ENCOUNTER — Ambulatory Visit: Attending: Internal Medicine | Admitting: Internal Medicine

## 2024-01-09 ENCOUNTER — Encounter: Payer: Self-pay | Admitting: Internal Medicine

## 2024-01-09 VITALS — BP 138/82 | HR 87 | Ht 67.0 in | Wt 217.2 lb

## 2024-01-09 DIAGNOSIS — I350 Nonrheumatic aortic (valve) stenosis: Secondary | ICD-10-CM

## 2024-01-09 DIAGNOSIS — Z6834 Body mass index (BMI) 34.0-34.9, adult: Secondary | ICD-10-CM

## 2024-01-09 DIAGNOSIS — E785 Hyperlipidemia, unspecified: Secondary | ICD-10-CM

## 2024-01-09 DIAGNOSIS — F201 Disorganized schizophrenia: Secondary | ICD-10-CM

## 2024-01-09 DIAGNOSIS — I428 Other cardiomyopathies: Secondary | ICD-10-CM | POA: Diagnosis not present

## 2024-01-09 DIAGNOSIS — I10 Essential (primary) hypertension: Secondary | ICD-10-CM | POA: Diagnosis not present

## 2024-01-09 DIAGNOSIS — I700 Atherosclerosis of aorta: Secondary | ICD-10-CM

## 2024-01-09 DIAGNOSIS — N1832 Chronic kidney disease, stage 3b: Secondary | ICD-10-CM

## 2024-01-09 DIAGNOSIS — I739 Peripheral vascular disease, unspecified: Secondary | ICD-10-CM

## 2024-01-09 NOTE — Patient Instructions (Signed)
 Medication Instructions:  No medication changes were made at this visit. Continue current regimen.   *If you need a refill on your cardiac medications before your next appointment, please call your pharmacy*  Lab Work: None ordered today. If you have labs (blood work) drawn today and your tests are completely normal, you will receive your results only by: MyChart Message (if you have MyChart) OR A paper copy in the mail If you have any lab test that is abnormal or we need to change your treatment, we will call you to review the results.  Testing/Procedures: None ordered today.  Follow-Up: At [facility], you and your health needs are our priority.  As part of our continuing mission to provide you with exceptional heart care, our providers are all part of one team.  This team includes your primary Cardiologist (physician) and Advanced Practice Providers or APPs (Physician Assistants and Nurse Practitioners) who all work together to provide you with the care you need, when you need it.  Your next appointment:   Follow up with Heart Failure Clinic in January 2026

## 2024-03-01 ENCOUNTER — Emergency Department (HOSPITAL_COMMUNITY)

## 2024-03-01 ENCOUNTER — Encounter (HOSPITAL_COMMUNITY): Payer: Self-pay

## 2024-03-01 ENCOUNTER — Other Ambulatory Visit: Payer: Self-pay

## 2024-03-01 ENCOUNTER — Inpatient Hospital Stay (HOSPITAL_COMMUNITY)

## 2024-03-01 ENCOUNTER — Inpatient Hospital Stay (HOSPITAL_COMMUNITY)
Admission: EM | Admit: 2024-03-01 | Discharge: 2024-03-21 | DRG: 064 | Disposition: A | Discharge status: Skilled Nursing Facility | Attending: Internal Medicine | Admitting: Internal Medicine

## 2024-03-01 DIAGNOSIS — E8729 Other acidosis: Secondary | ICD-10-CM | POA: Diagnosis not present

## 2024-03-01 DIAGNOSIS — I422 Other hypertrophic cardiomyopathy: Secondary | ICD-10-CM | POA: Diagnosis present

## 2024-03-01 DIAGNOSIS — I2510 Atherosclerotic heart disease of native coronary artery without angina pectoris: Secondary | ICD-10-CM | POA: Diagnosis present

## 2024-03-01 DIAGNOSIS — R414 Neurologic neglect syndrome: Secondary | ICD-10-CM | POA: Diagnosis present

## 2024-03-01 DIAGNOSIS — E1122 Type 2 diabetes mellitus with diabetic chronic kidney disease: Secondary | ICD-10-CM | POA: Diagnosis present

## 2024-03-01 DIAGNOSIS — E870 Hyperosmolality and hypernatremia: Secondary | ICD-10-CM | POA: Diagnosis not present

## 2024-03-01 DIAGNOSIS — E43 Unspecified severe protein-calorie malnutrition: Secondary | ICD-10-CM | POA: Diagnosis present

## 2024-03-01 DIAGNOSIS — I3139 Other pericardial effusion (noninflammatory): Secondary | ICD-10-CM | POA: Diagnosis present

## 2024-03-01 DIAGNOSIS — E669 Obesity, unspecified: Secondary | ICD-10-CM

## 2024-03-01 DIAGNOSIS — S40012A Contusion of left shoulder, initial encounter: Secondary | ICD-10-CM | POA: Diagnosis present

## 2024-03-01 DIAGNOSIS — I7123 Aneurysm of the descending thoracic aorta, without rupture: Secondary | ICD-10-CM | POA: Diagnosis not present

## 2024-03-01 DIAGNOSIS — E66811 Obesity, class 1: Secondary | ICD-10-CM | POA: Diagnosis present

## 2024-03-01 DIAGNOSIS — I350 Nonrheumatic aortic (valve) stenosis: Secondary | ICD-10-CM | POA: Diagnosis present

## 2024-03-01 DIAGNOSIS — G9340 Encephalopathy, unspecified: Secondary | ICD-10-CM | POA: Diagnosis present

## 2024-03-01 DIAGNOSIS — D72829 Elevated white blood cell count, unspecified: Secondary | ICD-10-CM | POA: Diagnosis present

## 2024-03-01 DIAGNOSIS — N179 Acute kidney failure, unspecified: Secondary | ICD-10-CM | POA: Diagnosis not present

## 2024-03-01 DIAGNOSIS — G8194 Hemiplegia, unspecified affecting left nondominant side: Secondary | ICD-10-CM | POA: Diagnosis present

## 2024-03-01 DIAGNOSIS — N1832 Chronic kidney disease, stage 3b: Secondary | ICD-10-CM | POA: Diagnosis present

## 2024-03-01 DIAGNOSIS — I10 Essential (primary) hypertension: Secondary | ICD-10-CM | POA: Diagnosis not present

## 2024-03-01 DIAGNOSIS — I639 Cerebral infarction, unspecified: Secondary | ICD-10-CM | POA: Diagnosis present

## 2024-03-01 DIAGNOSIS — E871 Hypo-osmolality and hyponatremia: Secondary | ICD-10-CM | POA: Diagnosis not present

## 2024-03-01 DIAGNOSIS — I716 Thoracoabdominal aortic aneurysm, without rupture, unspecified: Secondary | ICD-10-CM | POA: Diagnosis present

## 2024-03-01 DIAGNOSIS — I63511 Cerebral infarction due to unspecified occlusion or stenosis of right middle cerebral artery: Principal | ICD-10-CM | POA: Diagnosis present

## 2024-03-01 DIAGNOSIS — F200 Paranoid schizophrenia: Secondary | ICD-10-CM | POA: Diagnosis present

## 2024-03-01 DIAGNOSIS — E785 Hyperlipidemia, unspecified: Secondary | ICD-10-CM | POA: Diagnosis not present

## 2024-03-01 DIAGNOSIS — J9601 Acute respiratory failure with hypoxia: Secondary | ICD-10-CM | POA: Diagnosis present

## 2024-03-01 DIAGNOSIS — I6389 Other cerebral infarction: Secondary | ICD-10-CM | POA: Diagnosis not present

## 2024-03-01 DIAGNOSIS — R1310 Dysphagia, unspecified: Secondary | ICD-10-CM | POA: Diagnosis present

## 2024-03-01 DIAGNOSIS — F17210 Nicotine dependence, cigarettes, uncomplicated: Secondary | ICD-10-CM | POA: Diagnosis not present

## 2024-03-01 DIAGNOSIS — E440 Moderate protein-calorie malnutrition: Secondary | ICD-10-CM | POA: Insufficient documentation

## 2024-03-01 DIAGNOSIS — Z7901 Long term (current) use of anticoagulants: Secondary | ICD-10-CM

## 2024-03-01 DIAGNOSIS — Z9911 Dependence on respirator [ventilator] status: Secondary | ICD-10-CM

## 2024-03-01 DIAGNOSIS — W1830XA Fall on same level, unspecified, initial encounter: Secondary | ICD-10-CM | POA: Diagnosis present

## 2024-03-01 DIAGNOSIS — Z7982 Long term (current) use of aspirin: Secondary | ICD-10-CM

## 2024-03-01 DIAGNOSIS — I7121 Aneurysm of the ascending aorta, without rupture: Secondary | ICD-10-CM | POA: Diagnosis present

## 2024-03-01 DIAGNOSIS — I5020 Unspecified systolic (congestive) heart failure: Secondary | ICD-10-CM | POA: Diagnosis not present

## 2024-03-01 DIAGNOSIS — I5022 Chronic systolic (congestive) heart failure: Secondary | ICD-10-CM | POA: Diagnosis present

## 2024-03-01 DIAGNOSIS — R531 Weakness: Secondary | ICD-10-CM | POA: Diagnosis present

## 2024-03-01 DIAGNOSIS — E1151 Type 2 diabetes mellitus with diabetic peripheral angiopathy without gangrene: Secondary | ICD-10-CM | POA: Diagnosis present

## 2024-03-01 DIAGNOSIS — R471 Dysarthria and anarthria: Secondary | ICD-10-CM | POA: Diagnosis present

## 2024-03-01 DIAGNOSIS — Z79899 Other long term (current) drug therapy: Secondary | ICD-10-CM

## 2024-03-01 DIAGNOSIS — E1165 Type 2 diabetes mellitus with hyperglycemia: Secondary | ICD-10-CM | POA: Diagnosis not present

## 2024-03-01 DIAGNOSIS — E8809 Other disorders of plasma-protein metabolism, not elsewhere classified: Secondary | ICD-10-CM | POA: Diagnosis present

## 2024-03-01 DIAGNOSIS — Z781 Physical restraint status: Secondary | ICD-10-CM

## 2024-03-01 DIAGNOSIS — I130 Hypertensive heart and chronic kidney disease with heart failure and stage 1 through stage 4 chronic kidney disease, or unspecified chronic kidney disease: Secondary | ICD-10-CM | POA: Diagnosis present

## 2024-03-01 DIAGNOSIS — D75839 Thrombocytosis, unspecified: Secondary | ICD-10-CM | POA: Diagnosis present

## 2024-03-01 DIAGNOSIS — R0603 Acute respiratory distress: Secondary | ICD-10-CM

## 2024-03-01 DIAGNOSIS — R4701 Aphasia: Secondary | ICD-10-CM | POA: Diagnosis present

## 2024-03-01 DIAGNOSIS — Y92008 Other place in unspecified non-institutional (private) residence as the place of occurrence of the external cause: Secondary | ICD-10-CM | POA: Diagnosis not present

## 2024-03-01 DIAGNOSIS — R29810 Facial weakness: Secondary | ICD-10-CM | POA: Diagnosis present

## 2024-03-01 DIAGNOSIS — I7143 Infrarenal abdominal aortic aneurysm, without rupture: Secondary | ICD-10-CM | POA: Diagnosis present

## 2024-03-01 DIAGNOSIS — Z6833 Body mass index (BMI) 33.0-33.9, adult: Secondary | ICD-10-CM

## 2024-03-01 DIAGNOSIS — I7090 Unspecified atherosclerosis: Secondary | ICD-10-CM | POA: Diagnosis not present

## 2024-03-01 DIAGNOSIS — E872 Acidosis, unspecified: Secondary | ICD-10-CM | POA: Diagnosis present

## 2024-03-01 DIAGNOSIS — I129 Hypertensive chronic kidney disease with stage 1 through stage 4 chronic kidney disease, or unspecified chronic kidney disease: Secondary | ICD-10-CM | POA: Diagnosis not present

## 2024-03-01 DIAGNOSIS — E119 Type 2 diabetes mellitus without complications: Secondary | ICD-10-CM

## 2024-03-01 DIAGNOSIS — I959 Hypotension, unspecified: Secondary | ICD-10-CM | POA: Diagnosis present

## 2024-03-01 DIAGNOSIS — D631 Anemia in chronic kidney disease: Secondary | ICD-10-CM | POA: Diagnosis present

## 2024-03-01 DIAGNOSIS — I739 Peripheral vascular disease, unspecified: Secondary | ICD-10-CM | POA: Diagnosis not present

## 2024-03-01 DIAGNOSIS — F0390 Unspecified dementia without behavioral disturbance: Secondary | ICD-10-CM | POA: Diagnosis present

## 2024-03-01 DIAGNOSIS — I420 Dilated cardiomyopathy: Secondary | ICD-10-CM | POA: Diagnosis present

## 2024-03-01 DIAGNOSIS — Z7984 Long term (current) use of oral hypoglycemic drugs: Secondary | ICD-10-CM

## 2024-03-01 DIAGNOSIS — Z8249 Family history of ischemic heart disease and other diseases of the circulatory system: Secondary | ICD-10-CM

## 2024-03-01 LAB — I-STAT VENOUS BLOOD GAS, ED
Acid-Base Excess: 1 mmol/L (ref 0.0–2.0)
Bicarbonate: 26.3 mmol/L (ref 20.0–28.0)
Calcium, Ion: 1.16 mmol/L (ref 1.15–1.40)
HCT: 41 % (ref 36.0–46.0)
Hemoglobin: 13.9 g/dL (ref 12.0–15.0)
O2 Saturation: 72 %
Potassium: 3.8 mmol/L (ref 3.5–5.1)
Sodium: 144 mmol/L (ref 135–145)
TCO2: 28 mmol/L (ref 22–32)
pCO2, Ven: 42.8 mmHg — ABNORMAL LOW (ref 44–60)
pH, Ven: 7.396 (ref 7.25–7.43)
pO2, Ven: 38 mmHg (ref 32–45)

## 2024-03-01 LAB — I-STAT ARTERIAL BLOOD GAS, ED
Acid-Base Excess: 0 mmol/L (ref 0.0–2.0)
Bicarbonate: 25.7 mmol/L (ref 20.0–28.0)
Calcium, Ion: 1.22 mmol/L (ref 1.15–1.40)
HCT: 34 % — ABNORMAL LOW (ref 36.0–46.0)
Hemoglobin: 11.6 g/dL — ABNORMAL LOW (ref 12.0–15.0)
O2 Saturation: 100 %
Patient temperature: 98.5
Potassium: 3.5 mmol/L (ref 3.5–5.1)
Sodium: 141 mmol/L (ref 135–145)
TCO2: 27 mmol/L (ref 22–32)
pCO2 arterial: 45.7 mmHg (ref 32–48)
pH, Arterial: 7.358 (ref 7.35–7.45)
pO2, Arterial: 420 mmHg — ABNORMAL HIGH (ref 83–108)

## 2024-03-01 LAB — COMPREHENSIVE METABOLIC PANEL WITH GFR
ALT: 16 U/L (ref 0–44)
AST: 34 U/L (ref 15–41)
Albumin: 4 g/dL (ref 3.5–5.0)
Alkaline Phosphatase: 115 U/L (ref 38–126)
Anion gap: 16 — ABNORMAL HIGH (ref 5–15)
BUN: 49 mg/dL — ABNORMAL HIGH (ref 8–23)
CO2: 23 mmol/L (ref 22–32)
Calcium: 10.1 mg/dL (ref 8.9–10.3)
Chloride: 102 mmol/L (ref 98–111)
Creatinine, Ser: 2.51 mg/dL — ABNORMAL HIGH (ref 0.44–1.00)
GFR, Estimated: 20 mL/min — ABNORMAL LOW
Glucose, Bld: 148 mg/dL — ABNORMAL HIGH (ref 70–99)
Potassium: 3.8 mmol/L (ref 3.5–5.1)
Sodium: 142 mmol/L (ref 135–145)
Total Bilirubin: 1.2 mg/dL (ref 0.0–1.2)
Total Protein: 8.9 g/dL — ABNORMAL HIGH (ref 6.5–8.1)

## 2024-03-01 LAB — CBC WITH DIFFERENTIAL/PLATELET
Abs Immature Granulocytes: 0.07 K/uL (ref 0.00–0.07)
Basophils Absolute: 0 K/uL (ref 0.0–0.1)
Basophils Relative: 0 %
Eosinophils Absolute: 0 K/uL (ref 0.0–0.5)
Eosinophils Relative: 0 %
HCT: 39.9 % (ref 36.0–46.0)
Hemoglobin: 12.5 g/dL (ref 12.0–15.0)
Immature Granulocytes: 1 %
Lymphocytes Relative: 10 %
Lymphs Abs: 1.2 K/uL (ref 0.7–4.0)
MCH: 28 pg (ref 26.0–34.0)
MCHC: 31.3 g/dL (ref 30.0–36.0)
MCV: 89.5 fL (ref 80.0–100.0)
Monocytes Absolute: 1.1 K/uL — ABNORMAL HIGH (ref 0.1–1.0)
Monocytes Relative: 9 %
Neutro Abs: 9.7 K/uL — ABNORMAL HIGH (ref 1.7–7.7)
Neutrophils Relative %: 80 %
Platelets: 254 K/uL (ref 150–400)
RBC: 4.46 MIL/uL (ref 3.87–5.11)
RDW: 16 % — ABNORMAL HIGH (ref 11.5–15.5)
WBC: 12 K/uL — ABNORMAL HIGH (ref 4.0–10.5)
nRBC: 0 % (ref 0.0–0.2)

## 2024-03-01 LAB — I-STAT CHEM 8, ED
BUN: 50 mg/dL — ABNORMAL HIGH (ref 8–23)
Calcium, Ion: 1.16 mmol/L (ref 1.15–1.40)
Chloride: 106 mmol/L (ref 98–111)
Creatinine, Ser: 2.7 mg/dL — ABNORMAL HIGH (ref 0.44–1.00)
Glucose, Bld: 140 mg/dL — ABNORMAL HIGH (ref 70–99)
HCT: 41 % (ref 36.0–46.0)
Hemoglobin: 13.9 g/dL (ref 12.0–15.0)
Potassium: 3.8 mmol/L (ref 3.5–5.1)
Sodium: 144 mmol/L (ref 135–145)
TCO2: 24 mmol/L (ref 22–32)

## 2024-03-01 LAB — CBG MONITORING, ED
Glucose-Capillary: 167 mg/dL — ABNORMAL HIGH (ref 70–99)
Glucose-Capillary: 152 mg/dL — ABNORMAL HIGH (ref 70–99)

## 2024-03-01 LAB — LIPID PANEL
Cholesterol: 168 mg/dL (ref 0–200)
HDL: 36 mg/dL — ABNORMAL LOW
LDL Cholesterol: 106 mg/dL — ABNORMAL HIGH (ref 0–99)
Total CHOL/HDL Ratio: 4.7 ratio
Triglycerides: 131 mg/dL
VLDL: 26 mg/dL (ref 0–40)

## 2024-03-01 LAB — HEMOGLOBIN A1C
Hgb A1c MFr Bld: 5.8 % — ABNORMAL HIGH (ref 4.8–5.6)
Mean Plasma Glucose: 119.76 mg/dL

## 2024-03-01 LAB — CBC
HCT: 36.2 % (ref 36.0–46.0)
Hemoglobin: 11.5 g/dL — ABNORMAL LOW (ref 12.0–15.0)
MCH: 28.7 pg (ref 26.0–34.0)
MCHC: 31.8 g/dL (ref 30.0–36.0)
MCV: 90.3 fL (ref 80.0–100.0)
Platelets: 222 K/uL (ref 150–400)
RBC: 4.01 MIL/uL (ref 3.87–5.11)
RDW: 15.9 % — ABNORMAL HIGH (ref 11.5–15.5)
WBC: 10.8 K/uL — ABNORMAL HIGH (ref 4.0–10.5)
nRBC: 0 % (ref 0.0–0.2)

## 2024-03-01 LAB — TYPE AND SCREEN
ABO/RH(D): B POS
Antibody Screen: NEGATIVE

## 2024-03-01 LAB — I-STAT CG4 LACTIC ACID, ED: Lactic Acid, Venous: 2.8 mmol/L (ref 0.5–1.9)

## 2024-03-01 LAB — APTT: aPTT: 24 s (ref 24–36)

## 2024-03-01 LAB — CREATININE, SERUM
Creatinine, Ser: 2.87 mg/dL — ABNORMAL HIGH (ref 0.44–1.00)
GFR, Estimated: 17 mL/min — ABNORMAL LOW

## 2024-03-01 LAB — CK TOTAL AND CKMB (NOT AT ARMC)
CK, MB: 10.1 ng/mL — ABNORMAL HIGH (ref 0.5–5.0)
Total CK: 998 U/L — ABNORMAL HIGH (ref 38–234)

## 2024-03-01 LAB — GLUCOSE, CAPILLARY: Glucose-Capillary: 156 mg/dL — ABNORMAL HIGH (ref 70–99)

## 2024-03-01 LAB — TROPONIN T, HIGH SENSITIVITY: Troponin T High Sensitivity: 81 ng/L — ABNORMAL HIGH (ref 0–19)

## 2024-03-01 LAB — MRSA NEXT GEN BY PCR, NASAL: MRSA by PCR Next Gen: NOT DETECTED

## 2024-03-01 MED ORDER — DOCUSATE SODIUM 100 MG PO CAPS: 100.0000 mg | ORAL_CAPSULE | Freq: Two times a day (BID) | ORAL | Status: DC | PRN

## 2024-03-01 MED ORDER — DOCUSATE SODIUM 50 MG/5ML PO LIQD
100.0000 mg | Freq: Two times a day (BID) | ORAL | Status: DC | PRN
  Administered 2024-03-20: 100 mg
  Filled 2024-03-01 (×2): qty 10

## 2024-03-01 MED ORDER — ORAL CARE MOUTH RINSE
15.0000 mL | OROMUCOSAL | Status: DC
  Administered 2024-03-01 – 2024-03-02 (×11): 15 mL via OROMUCOSAL

## 2024-03-01 MED ORDER — HEPARIN SODIUM (PORCINE) 5000 UNIT/ML IJ SOLN
5000.0000 [IU] | Freq: Three times a day (TID) | INTRAMUSCULAR | Status: DC
  Administered 2024-03-01 – 2024-03-03 (×5): 5000 [IU] via SUBCUTANEOUS
  Filled 2024-03-01 (×4): qty 1

## 2024-03-01 MED ORDER — PROPOFOL 1000 MG/100ML IV EMUL
0.0000 ug/kg/min | INTRAVENOUS | Status: DC
  Administered 2024-03-01: 10 ug/kg/min via INTRAVENOUS
  Administered 2024-03-01: 30 ug/kg/min via INTRAVENOUS
  Filled 2024-03-01 (×2): qty 100

## 2024-03-01 MED ORDER — ASPIRIN 300 MG RE SUPP
300.0000 mg | RECTAL | Status: AC
  Administered 2024-03-01: 300 mg via RECTAL
  Filled 2024-03-01: qty 1

## 2024-03-01 MED ORDER — ESMOLOL HCL-SODIUM CHLORIDE 2000 MG/100ML IV SOLN
25.0000 ug/kg/min | INTRAVENOUS | Status: DC
  Administered 2024-03-01: 50 ug/kg/min via INTRAVENOUS

## 2024-03-01 MED ORDER — ROCURONIUM BROMIDE 10 MG/ML (PF) SYRINGE
PREFILLED_SYRINGE | INTRAVENOUS | Status: AC | PRN
  Administered 2024-03-01: 100 mg via INTRAVENOUS

## 2024-03-01 MED ORDER — ASPIRIN 81 MG PO CHEW: 81.0000 mg | CHEWABLE_TABLET | ORAL | Status: AC

## 2024-03-01 MED ORDER — ETOMIDATE 2 MG/ML IV SOLN
INTRAVENOUS | Status: AC | PRN
  Administered 2024-03-01: 20 mg via INTRAVENOUS

## 2024-03-01 MED ORDER — ESMOLOL HCL-SODIUM CHLORIDE 2000 MG/100ML IV SOLN
25.0000 ug/kg/min | INTRAVENOUS | Status: DC
  Administered 2024-03-01: 100 ug/kg/min via INTRAVENOUS
  Filled 2024-03-01: qty 100

## 2024-03-01 MED ORDER — STROKE: EARLY STAGES OF RECOVERY BOOK
Freq: Once | Status: AC
  Administered 2024-03-02: 1
  Filled 2024-03-01: qty 1

## 2024-03-01 MED ORDER — FENTANYL 2500MCG IN NS 250ML (10MCG/ML) PREMIX INFUSION
0.0000 ug/h | INTRAVENOUS | Status: DC
  Administered 2024-03-01: 25 ug/h via INTRAVENOUS
  Filled 2024-03-01: qty 250

## 2024-03-01 MED ORDER — CHLORHEXIDINE GLUCONATE CLOTH 2 % EX PADS
6.0000 | MEDICATED_PAD | Freq: Every day | CUTANEOUS | Status: DC
  Administered 2024-03-01 – 2024-03-03 (×3): 6 via TOPICAL

## 2024-03-01 MED ORDER — INSULIN ASPART 100 UNIT/ML IJ SOLN
0.0000 [IU] | INTRAMUSCULAR | Status: DC
  Administered 2024-03-01: 3 [IU] via SUBCUTANEOUS
  Administered 2024-03-02 – 2024-03-04 (×4): 2 [IU] via SUBCUTANEOUS
  Administered 2024-03-04: 3 [IU] via SUBCUTANEOUS
  Administered 2024-03-04 – 2024-03-05 (×3): 2 [IU] via SUBCUTANEOUS
  Administered 2024-03-05: 3 [IU] via SUBCUTANEOUS
  Administered 2024-03-05 – 2024-03-06 (×5): 2 [IU] via SUBCUTANEOUS
  Administered 2024-03-06: 3 [IU] via SUBCUTANEOUS
  Administered 2024-03-06 – 2024-03-07 (×4): 2 [IU] via SUBCUTANEOUS
  Administered 2024-03-07: 3 [IU] via SUBCUTANEOUS
  Administered 2024-03-07 – 2024-03-08 (×2): 2 [IU] via SUBCUTANEOUS
  Administered 2024-03-08: 3 [IU] via SUBCUTANEOUS
  Administered 2024-03-08 – 2024-03-10 (×11): 2 [IU] via SUBCUTANEOUS
  Administered 2024-03-10: 1 [IU] via SUBCUTANEOUS
  Administered 2024-03-11 – 2024-03-17 (×21): 2 [IU] via SUBCUTANEOUS
  Filled 2024-03-01: qty 1
  Filled 2024-03-01 (×2): qty 2
  Filled 2024-03-01: qty 1
  Filled 2024-03-01 (×7): qty 2
  Filled 2024-03-01: qty 1
  Filled 2024-03-01 (×2): qty 2
  Filled 2024-03-01 (×3): qty 1
  Filled 2024-03-01 (×3): qty 2
  Filled 2024-03-01: qty 3
  Filled 2024-03-01 (×8): qty 2

## 2024-03-01 MED ORDER — FENTANYL CITRATE (PF) 50 MCG/ML IJ SOSY
25.0000 ug | PREFILLED_SYRINGE | INTRAMUSCULAR | Status: DC | PRN
  Administered 2024-03-02: 25 ug via INTRAVENOUS

## 2024-03-01 MED ORDER — LACTATED RINGERS IV BOLUS
1000.0000 mL | Freq: Once | INTRAVENOUS | Status: AC
  Administered 2024-03-01: 1000 mL via INTRAVENOUS

## 2024-03-01 MED ORDER — FENTANYL CITRATE (PF) 50 MCG/ML IJ SOSY
25.0000 ug | PREFILLED_SYRINGE | Freq: Once | INTRAMUSCULAR | Status: AC
  Administered 2024-03-01: 25 ug via INTRAVENOUS

## 2024-03-01 MED ORDER — DOCUSATE SODIUM 50 MG/5ML PO LIQD
100.0000 mg | Freq: Two times a day (BID) | ORAL | Status: DC
  Administered 2024-03-01 – 2024-03-17 (×20): 100 mg
  Filled 2024-03-01 (×15): qty 10

## 2024-03-01 MED ORDER — POLYETHYLENE GLYCOL 3350 17 G PO PACK
17.0000 g | PACK | Freq: Every day | ORAL | Status: DC
  Administered 2024-03-02 – 2024-03-17 (×9): 17 g
  Filled 2024-03-01 (×8): qty 1

## 2024-03-01 MED ORDER — ORAL CARE MOUTH RINSE: 15.0000 mL | OROMUCOSAL | Status: DC | PRN

## 2024-03-01 MED ORDER — POLYETHYLENE GLYCOL 3350 17 G PO PACK: 17.0000 g | PACK | Freq: Every day | ORAL | Status: DC | PRN

## 2024-03-01 MED ORDER — ASPIRIN 81 MG PO CHEW
81.0000 mg | CHEWABLE_TABLET | Freq: Every day | ORAL | Status: DC
  Administered 2024-03-02 – 2024-03-11 (×10): 81 mg via NASOGASTRIC
  Filled 2024-03-01 (×3): qty 1

## 2024-03-01 MED ORDER — FENTANYL BOLUS VIA INFUSION
25.0000 ug | INTRAVENOUS | Status: DC | PRN
  Administered 2024-03-01: 25 ug via INTRAVENOUS

## 2024-03-01 MED ORDER — IOHEXOL 350 MG/ML SOLN
75.0000 mL | Freq: Once | INTRAVENOUS | Status: AC | PRN
  Administered 2024-03-01: 75 mL via INTRAVENOUS

## 2024-03-01 MED ORDER — SODIUM CHLORIDE 0.9 % IV SOLN: INTRAVENOUS | Status: AC

## 2024-03-01 NOTE — ED Triage Notes (Addendum)
 Patient was found by son today on the floor in her home where she lives alone. Left sided arm and leg weakness noted on EMS arrival. Son had gotten patient to edge of bed. . Left facial droop and slurted speech. CBG  159 Experienced apnea enroute and being bagged pta-this proceeded with a shallow, rapid respiratory effort. LKW christmas night On arrival periods of ongoing apnea and decision made to intubate. Yao and RT at bedside PIV x 2 pta

## 2024-03-01 NOTE — Progress Notes (Addendum)
 Case discussed in detail with ER staff.  Patient brought to [facility] ER for evaluation after being found unresponsive. L sided weakness / aphasia per EMS at scene. Intubated for airway protection by ER. Workup initiated. Enlargement of known thoracoabdominal aortic aneurysm which is now beyond the size threshold for repair on CT. CT angiogram personally reviewed in detail. No evidence of rupture.   Would prioritize stroke care as this is most imminently life threatening problem.   Her aneurysm would require open thoracoabdominal repair or highly complex physician modified endovascular repair. Both are highly morbid procedures with risk of death, stroke, MI, renal failure, pneumonia, etc.   Suspect she is too sick to undergo either of these therapies. Formal consult to follow.   Debby SAILOR. Magda, MD [facility] Phone Number: [phone] 03/01/2024 5:07 PM
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
## 2024-03-01 NOTE — H&P (Addendum)
 "  NAME:  Erin Riley, MRN:  [id number], DOB:  [birth year], LOS: 0 ADMISSION DATE:  03/01/2024, CONSULTATION DATE: 12/27 REFERRING MD: Patt, CHIEF COMPLAINT: Stroke  History of Present Illness:  73 year old female patient with known history of hypertension, severe aortic stenosis, nonischemic cardiomyopathy, stage IIIb CKD was at her last known normal state of health on 12/25.  Found by son at home today on 12/27 with left sided weakness, slurred speech and facial droop, also intermittent episodes of apnea.  Initial glucose 159. She was intubated upon arrival to the emergency room  ER evaluation: Underwent trauma evaluation given altered mental status including multiple diagnostic imaging.  Portable chest x-ray without acute abnormality, CT pelvis negative film of left arm and hand negative left wrist negative, CT angiogram of chest showed interval enlargement of a descending thoracic aortic aneurysm measured at 7.3 x 5 cm, this was bigger than previously recorded at 5.5 x 4.1 cm it also demonstrated suspected penetrating arthrosclerotic ulcer versus saccular aneurysm.  There was also interval enlargement of a suprarenal and infrarenal abdominal aortic aneurysm and a stable ascending thoracic aortic aneurysm.  Neurology was called in regards to the new neurological deficit, because of uncertain timeframe she was felt not a candidate for any neuroendovascular or emergent IV thrombolysis intervention. Initial blood pressure was in the 150s over 90s to 100s.  She was started on antihypertensives by emergency room. Preliminary CT of head shows large right sided temporoparietal infarct  Pertinent  Medical History  Hypertension, CKD stage IIIb, chronic back pain, coronary artery disease, diverticulitis, nonischemic dilated cardiomyopathy with NYHA class II symptoms.  Has severe LVH with history of LV outflow gradient and diastolic dysfunction.  History of mild aortic stenosis, peripheral arterial disease,  schizophrenia, obesity   Current laboratory data white blood cell count 12, total CK9 198, glucose 148 BUN 49 creatinine 2.5 up from 1.6 range, lactic acid 2.8 troponin 81 CT of head   Last cardiology November 2025, given history of schizophrenia thought not a candidate for TAVR Significant Hospital Events: Including procedures, antibiotic start and stop dates in addition to other pertinent events   73 year old female with admitted with new left-sided hemiparesis, dysphagia, and facial droop initially brought in as trauma code, trauma imaging negative however CT chest showing enlarging descending thoracic aortic aneurysm with possible penetrating arterial sclerotic ulcer versus saccular aneurysm, increasing suprarenal and infrarenal aortic aneurysm and stable ascending thoracic aortic aneurysm.  CT head showed large right hemisphere stroke  Interim History / Subjective:  Currently unresponsive post intubation, received fentanyl  and etomidate , as well as rocuronium   Objective    Blood pressure (!) 145/89, pulse 89, temperature 98.7 F (37.1 C), resp. rate (!) 24, height 5' 7 (1.702 m), weight 96 kg, last menstrual period 05/10/2013, SpO2 100%.    Vent Mode: PRVC FiO2 (%):  [100 %] 100 % Set Rate:  [16 bmp] 16 bmp Vt Set:  [490 mL] 490 mL PEEP:  [5 cmH20] 5 cmH20  No intake or output data in the 24 hours ending 03/01/24 1649 Filed Weights   03/01/24 1512  Weight: 96 kg    Examination: General: Sedated 73 year old female currently on full ventilator support HENT: Normocephalic atraumatic orally intubated pupils equal reactive Lungs: Clear bilaterally currently on full ventilator support Cardiovascular: Regular rate and rhythm without murmur rub or gallop Abdomen: Soft nontender Extremities: Warm dry Neuro: Sedated, status post neuromuscular blockade GU: Due to void  Resolved problem list   Assessment and Plan   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 and dysphagia - Appears to have completed, outside window for any sort of pharmacological or neurointerventional procedure Plan Admit to the intensive care Serial neurochecks Neuro protective interventions including: Head of bed elevated, avoid hypotension, keep euglycemic, avoid fever Given enlarging aortic aneurysms and history of aortic stenosis need to avoid significant hypertension, unfortunately we have Dueling goals, aneurysmal rupture would be fatal, deemed not a surgical candidate even for TAVR previously given her history of schizophrenia For now our goal will be to treat systolic blood pressure greater than 140, and avoid hypotension.  We have discussed this challenge with her sons Additional neuro prognostication per stroke team  Ventilator management Intubated for airway protection Plan Follow-up a.m. chest x-ray VAP bundle RASS goal -1 AM chest x-ray  Mild lactic acidosis. I do not think this is sepsis likely secondary to work of breathing Plan Follow-up lactate  Severe peripheral vascular disease with enlarging descending aortic aneurysm, enlarging suprarenal and infrarenal aortic aneurysm, and stable ascending aortic aneurysm - Not a candidate for intervention Plan For now we will avoid systolic blood pressure greater than 140 Formal vascular consult pending  History of nonischemic cardiomyopathy with NYHA class II symptoms, complicated by mild aortic stenosis Previously deemed not a candidate for TAVR given history of schizophrenia Plan Blood pressure control Avoid aggressive diuresis Avoid hypertension Telemetry monitoring  Acute on chronic renal failure with CKD stage IIIb Plan Blood pressure control Keep euvolemic Renal dose medicines Strict intake output  Diabetes type 2 with hyperglycemia Plan Sliding scale insulin   History of schizophrenia Plan Continue home meds  Leukocytosis Likely reactive Plan A.m. CBC  GOC Recommended  DNR should she arrest will encouraged her sons to talk about this   Labs   CBC: Recent Labs  Lab 03/01/24 1517 03/01/24 1531 03/01/24 1637  WBC 12.0*  --   --   NEUTROABS 9.7*  --   --   HGB 12.5 13.9  13.9 11.6*  HCT 39.9 41.0  41.0 34.0*  MCV 89.5  --   --   PLT 254  --   --     Basic Metabolic Panel: Recent Labs  Lab 03/01/24 1517 03/01/24 1531 03/01/24 1637  NA 142 144  144 141  K 3.8 3.8  3.8 3.5  CL 102 106  --   CO2 23  --   --   GLUCOSE 148* 140*  --   BUN 49* 50*  --   CREATININE 2.51* 2.70*  --   CALCIUM  10.1  --   --    GFR: Estimated Creatinine Clearance: 22.4 mL/min (A) (by C-G formula based on SCr of 2.7 mg/dL (H)). Recent Labs  Lab 03/01/24 1517 03/01/24 1531  WBC 12.0*  --   LATICACIDVEN  --  2.8*    Liver Function Tests: Recent Labs  Lab 03/01/24 1517  AST 34  ALT 16  ALKPHOS 115  BILITOT 1.2  PROT 8.9*  ALBUMIN  4.0   No results for input(s): LIPASE, AMYLASE in the last 168 hours. No results for input(s): AMMONIA in the last 168 hours.  ABG    Component Value Date/Time   PHART 7.358 03/01/2024 1637   PCO2ART 45.7 03/01/2024 1637   PO2ART 420 (H) 03/01/2024 1637   HCO3 25.7 03/01/2024 1637   TCO2 27 03/01/2024 1637   O2SAT 100 03/01/2024 1637     Coagulation Profile: No results for input(s): INR, PROTIME in the last 168 hours.  Cardiac  Enzymes: Recent Labs  Lab 03/01/24 1517  CKTOTAL 998*  CKMB 10.1*    HbA1C: Hgb A1c MFr Bld  Date/Time Value Ref Range Status  06/10/2022 06:41 AM 5.8 (H) 4.8 - 5.6 % Final    Comment:    (NOTE)         Prediabetes: 5.7 - 6.4         Diabetes: >6.4         Glycemic control for adults with diabetes: <7.0   02/15/2021 10:48 AM 5.5 4.8 - 5.6 % Final    Comment:             Prediabetes: 5.7 - 6.4          Diabetes: >6.4          Glycemic control for adults with diabetes: <7.0     CBG: Recent Labs  Lab 03/01/24 1508  GLUCAP 167*    Review of Systems:   Not  able  Past Medical History:  She,  has a past medical history of Chronic back pain, Chronic kidney disease (CKD), stage II (mild), Coronary artery disease (04/2009), Diverticulitis, History of (now resolved) Nonischemic dilated cardiomyopathy (01/2009), Hyperlipidemia, Hypertension, Hypertensive hypertrophic cardiomyopathy: NYHA class II:  Echo: Severe concentric LVH with LV OT gradient; essentially preserved EF with diastolic dysfunction (02/15/2013), Mild aortic stenosis by prior echocardiography, PAD (peripheral artery disease) (March 2013), and Schizophrenia [facility]).   Surgical History:   Past Surgical History:  Procedure Laterality Date   BUNIONECTOMY     carotid doppler  05/29/2011   left bulb/prox ICA moderate amtfibrous plaque with no evidence significant reduction.,right bulb /proximal ICA normal patency   lower extremity doppler  05/29/2011   right SFA 50% to 59% diameter reduction,right posterior tibal atreery occlusive disease,reconstituting distally, left common illiac<50%,left SFA 50 to70%,left post. tibial <50%   NM MYOCAR PERF WALL MOTION  03/2009   Persantine; EF 51%-both anterior and inferolateral ischemia   TRANSTHORACIC ECHOCARDIOGRAM  06/2016   Severe LVH.  Vigorous EF of 65-70%.  No RWMA. ~Only grade 1 diastolic dysfunction.  Mild aortic stenosis (mean gradient 15 mmHg)   TRANSTHORACIC ECHOCARDIOGRAM  07/2012   EF 50-55%; severe concentric LVH; only grade 1 diastolic dysfunction. Mild aortic sclerosis - with LVOT /intracavitary gradient of roughly 20 mmHg mean. Mild to moderately dilated LA;; previously reported MR not seen     Social History:   reports that she has been smoking cigarettes. She has never used smokeless tobacco. She reports that she does not drink alcohol and does not use drugs.   Family History:  Her family history includes Hypertension in her mother. There is no history of Breast cancer.   Allergies Allergies[1]   Home Medications  Prior to  Admission medications  Medication Sig Start Date End Date Taking? Authorizing Provider  amLODipine  (NORVASC ) 10 MG tablet Take 1 tablet (10 mg total) by mouth daily. 12/28/23   Donnelly Mellow, MD  aspirin  EC 81 MG tablet Take 1 tablet (81 mg total) by mouth daily. Swallow whole. 08/02/23   Milford, Harlene [name], FNP  atorvastatin  (LIPITOR) 40 MG tablet Take 1 tablet (40 mg total) by mouth at bedtime. 08/02/23   Glena Harlene [name], FNP  carvedilol  (COREG ) 3.125 MG tablet Take 1 tablet (3.125 mg total) by mouth 2 (two) times daily with a meal. 08/02/23   [place]ford, Harlene [name], FNP  empagliflozin  (JARDIANCE ) 10 MG TABS tablet Take 1 tablet (10 mg total) by mouth daily. 08/02/23   Glena Harlene [name],  FNP  ferrous sulfate  325 (65 FE) MG tablet Take 1 tablet (325 mg total) by mouth daily with breakfast. 09/14/22   Singh, Prashant K, MD  furosemide  (LASIX ) 40 MG tablet Take 1 tablet (40 mg total) by mouth 2 (two) times daily. 12/28/23   Donnelly Mellow, MD  OLANZapine  (ZYPREXA ) 7.5 MG tablet Take 1 tablet (7.5 mg total) by mouth at bedtime. 12/28/23   Donnelly Mellow, MD  potassium chloride  (KLOR-CON  M) 10 MEQ tablet Take 3 tablets (30 mEq total) by mouth daily. 08/21/23   Marcine Catalan M, PA-C  sacubitril -valsartan  (ENTRESTO ) 49-51 MG Take 1 tablet by mouth 2 (two) times daily. 12/17/23   Lee, Jordan, NP     Critical care time:  I personally  spent 39 minutes  on this patient which included: review of medical records, nursing notes, progress notes, evaluation, interpretation of lab data and diagnostic studies, taking independent history, performing exam, documenting plan, ordering diagnostics and interventions for the following critical care issues: Acute respiratory failure, Acute toxic and or metabolic encephalopathy, Stroke with the following interventions which included: titration of hemodynamic drips to desired MAP, evaluation and management of acute neurological insult                [1] No  Known Allergies  "

## 2024-03-01 NOTE — ED Provider Notes (Signed)
 " [place] [facility] Provider Note   HPI/ROS    History obtained from EMS.  Erin Riley is a 73 y.o. female who presents for No chief complaint on file. 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  Patient presents today found down in her house with last known well of Christmas Day.  She was found by her son today with left-sided arm and leg weakness as well as facial droop and slurred speech.  When EMS got there the patient had similar neurodeficits.  En route she had periodic apnea and was requiring bagging.  Glucose 159 with EMS.  Patient not answering any questions appropriately at this time or interactive on exam.  MDM   I have reviewed the nursing documentation, vital signs, as well as the past medical history, surgical history, family history, and social history.  Initial Assessment:  Patient ill-appearing with periodic apnea on arrival.  Does still have what appears to be possible right-sided gaze deviation with left-sided weakness.  Intermittently will not require bagging, but then will become apneic and required bagging again.  Patient ultimately intubated with rocuronium  and etomidate .  Out of the window for TNK or thrombectomy for LVO at this time.  But will still obtain CTA head and neck.  Also obtain full trauma scans as well as labs and CK given she was down for unknown amount of time.  Bedside echo with poor LV function and very large descending aorta.   Concern for possible aortic dissection given transient symptoms and neurodeficits.  Troponin added onto labs and broad labs obtained including gas, lactate, CBC, CMP.  Patient placed on prep and fentanyl  for intubation and sedation.  Ridging with worsening thoracic and abdominal aortic aneurysms.  Patient was placed on esmolol  for a brief period for blood pressure management.  Spoke with vascular surgery and CT surgery regarding aortic aneurysm.  Both will plan to follow the patient, but without acute intervention at this time as patient also has a large stroke on head CT.  Neurology consulted at this time.  Very difficult to assess what proper blood pressure goals would be at this time given acute stroke and worsening bleeding aortic aneurysm.  CBC here with mild leukocytosis without anemia.  CMP with AKI without significant electrolyte abnormality.  Lactate elevated 2.8.  Troponin elevated 81 and gas within normal limits.  CK mildly elevated 998.  Spoke with critical care regarding admission and they agree.  CT surgery, vascular surgery, and neurology planning to follow the patient.  Disposition:  I discussed the case with [name] who graciously agreed to admit the patient to their service for continued care.   This patient was staffed with Dr. Patt who supervised the visit and agreed with the plan of care.   Due to the patients current presenting symptoms, physical exam findings, and the workup stated above, it is thought that the etiology of the patients current presentation is:  1. Aneurysm of descending thoracic aorta, unspecified whether ruptured   2. Cerebrovascular accident (CVA), unspecified mechanism (HCC)   3. Respiratory distress    Clinical Complexity A  medically appropriate history, review of systems, and physical exam was performed.  Factors that affect the complexity of this encounter: assessment of correct protocol, laboratory work from this visit, notes from other physicians  (cardiology), and review of echocardiogram/EKG results  My independent interpretations of diagnostic studies are documented in the ED course above.   If decision rules were used in this patient's evaluation, they are listed below.   Click here for ABCD2, HEART and other calculators  Patient's presentation is most consistent with acute presentation with potential threat to life or bodily function.  MDM generated using voice dictation software and may contain dictation errors. Please contact me for any clarification or with any questions.    Physical Exam, PMH, PSH, Family History, and Social Hsitory   Vitals:   03/01/24 1730 03/01/24 1815 03/01/24 1820 03/01/24 1835  BP: 105/74 105/79 (!) 107/94 99/73  Pulse: 75 88    Resp: (!) 26 16 18 19   Temp: 98.2 F (36.8 C) 97.8 F (36.6 C) 97.8 F (36.6 C) 97.8 F (36.6 C)  TempSrc:      SpO2: 100% 99%    Weight:      Height:        Physical Exam Constitutional:      Appearance: She is ill-appearing. She is not toxic-appearing.  HENT:     Head: Normocephalic and atraumatic.  Eyes:     Pupils: Pupils are equal, round, and reactive to light.  Cardiovascular:     Rate and Rhythm: Normal rate and regular rhythm.  Pulmonary:     Effort: Pulmonary effort is normal.     Breath sounds: No rhonchi or rales.  Abdominal:     General: Abdomen is flat.     Palpations: Abdomen is soft.  Musculoskeletal:     Comments: Swelling of left wrist/forearm.  Bruising over left shoulder.  Unable to assess tenderness given patient is currently intubated.  Skin:    General: Skin is warm and dry.     Capillary Refill: Capillary refill takes less than 2 seconds.  Neurological:     General: No focal deficit present.     Mental Status: She is oriented to person, place, and time.     Past Medical History:  Diagnosis Date   Chronic back pain    Chronic kidney disease (CKD), stage II (mild)    Class I-II   Coronary artery disease 04/2009   50%  stenosis in the perforator of LAD; catheterization was for an abnormal Myoview in January 2000 showing anterior and inferolateral ischemia.   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 > 3 subsequent Echos show improved/normal EF with moderate to severe concentric LVH and diastolic dysfunction with LVOT/intracavitary gradient --> 06/2016: Severe LVH.  Vigorous EF, 65-70%.?? Gr 1 DD. Mild AS.   Hyperlipidemia    Hypertension    Hypertensive hypertrophic cardiomyopathy: NYHA class II:  Echo: Severe concentric LVH with LV OT gradient; essentially preserved EF with diastolic dysfunction 02/15/2013   Echo 06/2016: Severe Concentric LVH. Vigorous EF 65-70%. ~ Gr I DD.    Mild aortic stenosis by prior echocardiography    Echo 06/2016: Mild AS (Mean Gradient 15 mmHg); has had prior Mod-Severe MR (not seen on current echo)   PAD (peripheral artery disease) March 2013   Lower extremity Dopplers: R. SFA 50-60%, R. PTA proximally occluded with distal reconstitution;; L. common iliac ~50%,  L. SFA 50-70% stenosis, L. PTA < 50%   Schizophrenia (HCC)      Past Surgical History:  Procedure Laterality Date   BUNIONECTOMY     carotid doppler  05/29/2011   left bulb/prox ICA moderate amtfibrous plaque with no evidence significant reduction.,right bulb /proximal ICA normal patency   lower extremity doppler  05/29/2011   right SFA 50% to 59% diameter reduction,right posterior tibal atreery occlusive disease,reconstituting distally, left common illiac<50%,left SFA 50 to70%,left post. tibial <50%   NM MYOCAR PERF WALL MOTION  03/2009   Persantine; EF 51%-both anterior and inferolateral ischemia   TRANSTHORACIC ECHOCARDIOGRAM  06/2016   Severe LVH.  Vigorous EF of 65-70%.  No RWMA. ~Only grade 1 diastolic dysfunction.  Mild aortic stenosis (mean gradient 15 mmHg)   TRANSTHORACIC ECHOCARDIOGRAM  07/2012   EF  50-55%; severe concentric LVH; only grade 1 diastolic dysfunction. Mild aortic sclerosis - with LVOT /intracavitary gradient of roughly 20 mmHg mean. Mild to moderately dilated LA;; previously reported MR not seen     Family History  Problem Relation Age of Onset   Hypertension Mother    Breast cancer Neg Hx     Social History   Tobacco Use   Smoking status: Some Days    Current packs/day: 0.00    Types: Cigarettes    Last attempt to quit: 05/14/2002    Years since quitting: 21.8   Smokeless tobacco: Never  Substance Use Topics   Alcohol use: No     Procedures   If procedures were preformed on this patient, they are listed below:  Procedure Name: Intubation Date/Time: 03/01/2024 5:57 PM  Performed by: Guillermina Hamilton, MDPre-anesthesia Checklist: Patient identified, Emergency Drugs available, Timeout performed, Suction available and Patient being monitored Oxygen Delivery Method: Ambu bag Preoxygenation: Pre-oxygenation with 100% oxygen Induction Type: Rapid sequence Ventilation: Mask ventilation without difficulty Laryngoscope Size: Glidescope, Mac and 4 Grade View: Grade I Tube size: 7.5 mm Number of attempts: 1 Airway Equipment and Method: Video-laryngoscopy and Rigid stylet Placement Confirmation: ETT inserted through vocal cords under direct vision, Positive ETCO2 and CO2 detector Secured at: 23 cm Tube secured with: ETT holder Dental Injury: Teeth and Oropharynx as per pre-operative assessment     Ultrasound ED Echo  Date/Time: 03/01/2024 5:58 PM  Performed by: Guillermina Hamilton, MD Authorized by: Patt Alm Macho, MD   Procedure details:    Indications comment:  Strokelike symptoms   Views: subxiphoid, parasternal short axis view, apical 4 chamber view and IVC view     Images: archived   Findings:    Pericardium: small pericardial effusion     LV Function: severly depressed (<30%)     RV Diameter: normal     IVC: normal and collapsed   Impression:     Impression comment:  Small pericardial effusion with some fairly depressed LVEF.  Large descending aortic aneurysm size.    Electronically signed by:   Hamilton Carlin Guillermina, M.D. PGY-2, Emergency Medicine   Please note that this documentation was produced with the assistance of voice-to-text technology and may contain errors.    Guillermina Hamilton, MD 03/01/24 1857    Patt Alm Macho, MD 03/01/24 2322  "

## 2024-03-01 NOTE — ED Notes (Signed)
 CBG was 158

## 2024-03-01 NOTE — ED Notes (Signed)
 Unable to draw repeat labs, will have phlebotomy obtain labs.

## 2024-03-01 NOTE — Progress Notes (Signed)
 Pt transported from ED-2H,via ventilator, w/o complications. RT and RN at bedside.

## 2024-03-02 ENCOUNTER — Inpatient Hospital Stay (HOSPITAL_COMMUNITY)

## 2024-03-02 DIAGNOSIS — I5020 Unspecified systolic (congestive) heart failure: Secondary | ICD-10-CM

## 2024-03-02 DIAGNOSIS — I6389 Other cerebral infarction: Secondary | ICD-10-CM | POA: Diagnosis not present

## 2024-03-02 DIAGNOSIS — I7090 Unspecified atherosclerosis: Secondary | ICD-10-CM | POA: Diagnosis not present

## 2024-03-02 DIAGNOSIS — R471 Dysarthria and anarthria: Secondary | ICD-10-CM | POA: Diagnosis not present

## 2024-03-02 DIAGNOSIS — I63511 Cerebral infarction due to unspecified occlusion or stenosis of right middle cerebral artery: Secondary | ICD-10-CM | POA: Diagnosis not present

## 2024-03-02 DIAGNOSIS — R1310 Dysphagia, unspecified: Secondary | ICD-10-CM | POA: Diagnosis not present

## 2024-03-02 DIAGNOSIS — I130 Hypertensive heart and chronic kidney disease with heart failure and stage 1 through stage 4 chronic kidney disease, or unspecified chronic kidney disease: Secondary | ICD-10-CM | POA: Diagnosis not present

## 2024-03-02 DIAGNOSIS — Z9911 Dependence on respirator [ventilator] status: Secondary | ICD-10-CM

## 2024-03-02 DIAGNOSIS — I639 Cerebral infarction, unspecified: Secondary | ICD-10-CM | POA: Diagnosis not present

## 2024-03-02 DIAGNOSIS — E8729 Other acidosis: Secondary | ICD-10-CM | POA: Diagnosis not present

## 2024-03-02 DIAGNOSIS — N1832 Chronic kidney disease, stage 3b: Secondary | ICD-10-CM | POA: Diagnosis not present

## 2024-03-02 LAB — GLUCOSE, CAPILLARY
Glucose-Capillary: 100 mg/dL — ABNORMAL HIGH (ref 70–99)
Glucose-Capillary: 130 mg/dL — ABNORMAL HIGH (ref 70–99)
Glucose-Capillary: 78 mg/dL (ref 70–99)
Glucose-Capillary: 86 mg/dL (ref 70–99)
Glucose-Capillary: 90 mg/dL (ref 70–99)
Glucose-Capillary: 91 mg/dL (ref 70–99)
Glucose-Capillary: 95 mg/dL (ref 70–99)

## 2024-03-02 LAB — ECHOCARDIOGRAM COMPLETE
AR max vel: 0.78 cm2
AV Area VTI: 0.81 cm2
AV Area mean vel: 0.78 cm2
AV Mean grad: 33 mmHg
AV Peak grad: 58.2 mmHg
Ao pk vel: 3.82 m/s
Area-P 1/2: 3.63 cm2
Height: 67 in
MV M vel: 5.23 m/s
MV Peak grad: 109.4 mmHg
P 1/2 time: 425 ms
S' Lateral: 6.1 cm
Weight: 3432.12 [oz_av]

## 2024-03-02 LAB — CBC
HCT: 38.4 % (ref 36.0–46.0)
Hemoglobin: 11.9 g/dL — ABNORMAL LOW (ref 12.0–15.0)
MCH: 28.3 pg (ref 26.0–34.0)
MCHC: 31 g/dL (ref 30.0–36.0)
MCV: 91.4 fL (ref 80.0–100.0)
Platelets: 208 K/uL (ref 150–400)
RBC: 4.2 MIL/uL (ref 3.87–5.11)
RDW: 16.3 % — ABNORMAL HIGH (ref 11.5–15.5)
WBC: 12.4 K/uL — ABNORMAL HIGH (ref 4.0–10.5)
nRBC: 0 % (ref 0.0–0.2)

## 2024-03-02 LAB — TRIGLYCERIDES: Triglycerides: 113 mg/dL

## 2024-03-02 LAB — POCT I-STAT 7, (LYTES, BLD GAS, ICA,H+H)
Acid-base deficit: 5 mmol/L — ABNORMAL HIGH (ref 0.0–2.0)
Bicarbonate: 19.9 mmol/L — ABNORMAL LOW (ref 20.0–28.0)
Calcium, Ion: 1.11 mmol/L — ABNORMAL LOW (ref 1.15–1.40)
HCT: 29 % — ABNORMAL LOW (ref 36.0–46.0)
Hemoglobin: 9.9 g/dL — ABNORMAL LOW (ref 12.0–15.0)
O2 Saturation: 47 %
Potassium: 4.1 mmol/L (ref 3.5–5.1)
Sodium: 139 mmol/L (ref 135–145)
TCO2: 21 mmol/L — ABNORMAL LOW (ref 22–32)
pCO2 arterial: 35.2 mmHg (ref 32–48)
pH, Arterial: 7.361 (ref 7.35–7.45)
pO2, Arterial: 26 mmHg — CL (ref 83–108)

## 2024-03-02 LAB — BASIC METABOLIC PANEL WITH GFR
Anion gap: 18 — ABNORMAL HIGH (ref 5–15)
BUN: 57 mg/dL — ABNORMAL HIGH (ref 8–23)
CO2: 19 mmol/L — ABNORMAL LOW (ref 22–32)
Calcium: 9.1 mg/dL (ref 8.9–10.3)
Chloride: 103 mmol/L (ref 98–111)
Creatinine, Ser: 3.32 mg/dL — ABNORMAL HIGH (ref 0.44–1.00)
GFR, Estimated: 14 mL/min — ABNORMAL LOW
Glucose, Bld: 79 mg/dL (ref 70–99)
Potassium: 4.8 mmol/L (ref 3.5–5.1)
Sodium: 139 mmol/L (ref 135–145)

## 2024-03-02 LAB — LACTIC ACID, PLASMA: Lactic Acid, Venous: 2.3 mmol/L (ref 0.5–1.9)

## 2024-03-02 LAB — CK: Total CK: 659 U/L — ABNORMAL HIGH (ref 38–234)

## 2024-03-02 LAB — MAGNESIUM: Magnesium: 2.5 mg/dL — ABNORMAL HIGH (ref 1.7–2.4)

## 2024-03-02 MED ORDER — NOREPINEPHRINE 4 MG/250ML-% IV SOLN
INTRAVENOUS | Status: AC
  Filled 2024-03-02: qty 250

## 2024-03-02 MED ORDER — PERFLUTREN LIPID MICROSPHERE
1.0000 mL | INTRAVENOUS | Status: AC | PRN
  Administered 2024-03-02: 3 mL via INTRAVENOUS

## 2024-03-02 MED ORDER — SODIUM CHLORIDE 0.9 % IV BOLUS
500.0000 mL | Freq: Once | INTRAVENOUS | Status: AC
  Administered 2024-03-02: 500 mL via INTRAVENOUS

## 2024-03-02 MED ORDER — ORAL CARE MOUTH RINSE
15.0000 mL | OROMUCOSAL | Status: DC
  Administered 2024-03-02 – 2024-03-21 (×71): 15 mL via OROMUCOSAL

## 2024-03-02 MED ORDER — ORAL CARE MOUTH RINSE: 15.0000 mL | OROMUCOSAL | Status: DC | PRN

## 2024-03-02 MED ORDER — NOREPINEPHRINE 4 MG/250ML-% IV SOLN: 0.0000 ug/min | INTRAVENOUS | Status: DC

## 2024-03-02 MED ORDER — SODIUM CHLORIDE 0.9 % IV SOLN
250.0000 mL | INTRAVENOUS | Status: AC
  Administered 2024-03-02: 250 mL via INTRAVENOUS

## 2024-03-02 MED ORDER — LACTATED RINGERS IV SOLN: INTRAVENOUS | Status: DC

## 2024-03-02 MED ORDER — OLANZAPINE 10 MG IM SOLR
2.5000 mg | Freq: Once | INTRAMUSCULAR | Status: AC
  Administered 2024-03-02: 2.5 mg via INTRAVENOUS
  Filled 2024-03-02: qty 10

## 2024-03-02 MED ORDER — STERILE WATER FOR INJECTION IJ SOLN
INTRAMUSCULAR | Status: AC
  Administered 2024-03-02: 2.1 mL
  Filled 2024-03-02: qty 10

## 2024-03-02 MED ORDER — SODIUM CHLORIDE 0.9 % IV BOLUS: 1000.0000 mL | Freq: Once | INTRAVENOUS | Status: DC

## 2024-03-02 MED ORDER — LACTATED RINGERS IV BOLUS
500.0000 mL | Freq: Once | INTRAVENOUS | Status: AC
  Administered 2024-03-02: 500 mL via INTRAVENOUS

## 2024-03-02 NOTE — Consult Note (Signed)
 [facility]  ASSESSMENT / PLAN: 73 y.o. 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  She suffered a dense stroke the day prior to presentation.  Patient is not a candidate for open or endovascular repair of the aneurysm at this time. Any repair would require heparinization, which would put her at significant risk for hemorrhagic conversion and death.   She has a very high risk of annual rupture from this aneurysm.  Repair cannot be considered until her brain is recovered from stroke.  Typically we wait 6 months before considering repair in this situation.  Palliative care consultation may be helpful.  CHIEF COMPLAINT: Aneurysm discovered on workup  HISTORY OF PRESENT ILLNESS: Erin Riley is a 73 y.o. female who presents to the hospital for evaluation of stroke.  The patient was found down by her family and taken to the emergency room with fairly dense left-sided weakness and aphasia.  Workup was initiated.  CT angiogram was performed given history of known aneurysm.  This showed enlargement of known thoracoabdominal aneurysm.  Patient was previously seen in 2024 for similar issue when she was in better health.  At that time she was encouraged to follow-up with our office to discuss endovascular repair.  She did not return for follow-up despite significant effort from our office staff.  Past Medical History:  Diagnosis Date   Chronic back pain    Chronic kidney disease (CKD), stage II (mild)    Class I-II   Coronary artery disease 04/2009   50% stenosis in the perforator of LAD; catheterization was for an abnormal Myoview in January 2000 showing anterior and inferolateral ischemia.   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 > 3 subsequent Echos show improved/normal EF with moderate  to severe concentric LVH and diastolic dysfunction with LVOT/intracavitary gradient --> 06/2016: Severe LVH.  Vigorous EF, 65-70%.?? Gr 1 DD. Mild AS.   Hyperlipidemia    Hypertension    Hypertensive hypertrophic cardiomyopathy: NYHA class II:  Echo: Severe concentric LVH with LV OT gradient; essentially preserved EF with diastolic dysfunction 02/15/2013   Echo 06/2016: Severe Concentric LVH. Vigorous EF 65-70%. ~ Gr I DD.    Mild aortic stenosis by prior echocardiography    Echo 06/2016: Mild AS (Mean Gradient 15 mmHg); has had prior Mod-Severe MR (not seen on current echo)   PAD (peripheral artery disease) March 2013   Lower extremity Dopplers: R. SFA 50-60%, R. PTA proximally occluded with distal reconstitution;; L. common iliac ~50%, L. SFA 50-70% stenosis, L. PTA < 50%   Schizophrenia (HCC)     Past Surgical History:  Procedure Laterality Date   BUNIONECTOMY     carotid doppler  05/29/2011   left bulb/prox ICA moderate amtfibrous plaque with no evidence significant reduction.,right bulb /proximal ICA normal patency   lower extremity doppler  05/29/2011   right SFA 50% to 59% diameter reduction,right posterior tibal atreery occlusive disease,reconstituting distally, left common illiac<50%,left SFA 50 to70%,left post. tibial <50%   NM MYOCAR PERF WALL MOTION  03/2009   Persantine; EF 51%-both anterior and inferolateral ischemia   TRANSTHORACIC ECHOCARDIOGRAM  06/2016   Severe LVH.  Vigorous EF of 65-70%.  No RWMA. ~Only grade 1 diastolic dysfunction.  Mild aortic stenosis (mean gradient 15 mmHg)   TRANSTHORACIC ECHOCARDIOGRAM  07/2012   EF 50-55%; severe concentric  LVH; only grade 1 diastolic dysfunction. Mild aortic sclerosis - with LVOT /intracavitary gradient of roughly 20 mmHg mean. Mild to moderately dilated LA;; previously reported MR not seen    Family History  Problem Relation Age of Onset   Hypertension Mother    Breast cancer Neg Hx     Social History   Socioeconomic  History   Marital status: Single    Spouse name: Not on file   Number of children: 7   Years of education: Not on file   Highest education level: Not on file  Occupational History   Not on file  Tobacco Use   Smoking status: Some Days    Current packs/day: 0.00    Types: Cigarettes    Last attempt to quit: 05/14/2002    Years since quitting: 21.8   Smokeless tobacco: Never  Vaping Use   Vaping status: Never Used  Substance and Sexual Activity   Alcohol use: No   Drug use: No   Sexual activity: Yes    Birth control/protection: Post-menopausal, None  Other Topics Concern   Not on file  Social History Narrative   Now single mother of 2 with one grandchild. She quit smoking roughly 5 years ago and is not so since. She has also stopped drinking alcohol. She does try get routine exercise walking at least a mile 3-4 days a week.    She lives with her 61 year old mother. 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   Social Drivers of Health   Tobacco Use: High Risk (03/01/2024)   Patient History    Smoking Tobacco Use: Some Days    Smokeless Tobacco Use: Never    Passive Exposure: Not on file  Financial Resource Strain: Not on file  Food Insecurity: No Food Insecurity (03/02/2024)   Epic    Worried About Programme Researcher, Broadcasting/film/video in the Last Year: Never true    Ran Out of Food in the Last Year: Never true  Transportation Needs: No Transportation Needs (03/02/2024)   Epic    Lack of Transportation (Medical): No    Lack of Transportation (Non-Medical): No  Physical Activity: Not on file  Stress: Not on file  Social Connections: Unknown (03/02/2024)   Social Connection and Isolation Panel    Frequency of Communication with Friends and Family: Three times a week    Frequency of Social Gatherings with Friends and Family: Twice a week    Attends Religious Services: 1 to 4 times per year    Active Member of [employer] or Organizations: No    Attends Banker Meetings: Never    Marital  Status: Not on file  Intimate Partner Violence: Not At Risk (03/02/2024)   Epic    Fear of Current or Ex-Partner: No    Emotionally Abused: No    Physically Abused: No    Sexually Abused: No  Depression (PHQ2-9): Not on file  Alcohol Screen: Low Risk (12/20/2023)   Alcohol Screen    Last Alcohol Screening Score (AUDIT): 0  Housing: Low Risk (03/02/2024)   Epic    Unable to Pay for Housing in the Last Year: No    Number of Times Moved in the Last Year: 0    Homeless in the Last Year: No  Utilities: Not At Risk (03/02/2024)   Epic    Threatened with loss of utilities: No  Health Literacy: Not on file    Allergies[1]  Current Facility-Administered Medications  Medication Dose Route Frequency Provider Last Rate Last Admin  stroke: early stages of recovery book   Does not apply Once Jenna Maude BRAVO, NP       0.9 %  sodium chloride  infusion   Intravenous Continuous Jenna Maude BRAVO, NP   Paused at 03/02/24 0059   0.9 %  sodium chloride  infusion  250 mL Intravenous Continuous Gleason, Leita SAUNDERS, PA-C       aspirin  chewable tablet 81 mg  81 mg Per NG tube Daily Lindzen, Eric, MD       Chlorhexidine  Gluconate Cloth 2 % PADS 6 each  6 each Topical Daily Jenna Maude BRAVO, NP   6 each at 03/01/24 2033   docusate (COLACE) 50 MG/5ML liquid 100 mg  100 mg Per Tube BID Babcock, Peter E, NP   100 mg at 03/01/24 2150   docusate (COLACE) 50 MG/5ML liquid 100 mg  100 mg Per Tube BID PRN [name], Donnice SAUNDERS, MD       fentaNYL  (SUBLIMAZE ) bolus via infusion 25-100 mcg  25-100 mcg Intravenous Q15 min PRN Patt Alm Macho, MD   25 mcg at 03/01/24 1950   fentaNYL  (SUBLIMAZE ) injection 25 mcg  25 mcg Intravenous Q30 min PRN Jenna Maude BRAVO, NP       fentaNYL  in NS 250mL (83mcg/ml) infusion-PREMIX  0-400 mcg/hr Intravenous Continuous Patt Alm Macho, MD 3.5 mL/hr at 03/02/24 0800 35 mcg/hr at 03/02/24 0800   heparin  injection 5,000 Units  5,000 Units Subcutaneous Q8H Babcock, Peter E, NP    5,000 Units at 03/02/24 9465   insulin  aspart (novoLOG ) injection 0-15 Units  0-15 Units Subcutaneous Q4H Babcock, Peter E, NP   2 Units at 03/02/24 9970   lactated ringers  infusion   Intravenous Continuous Marion Damien DASEN, MD 100 mL/hr at 03/02/24 0800 Infusion Verify at 03/02/24 0800   norepinephrine  (LEVOPHED ) 4mg  in (0.016 mg/mL) premix infusion  0-10 mcg/min Intravenous Titrated Gleason, Leita SAUNDERS, PA-C       Oral care mouth rinse  15 mL Mouth Rinse Q2H [name], Donnice SAUNDERS, MD   15 mL at 03/02/24 0745   Oral care mouth rinse  15 mL Mouth Rinse PRN [name], Donnice SAUNDERS, MD       polyethylene glycol (MIRALAX  / GLYCOLAX ) packet 17 g  17 g Per Tube Daily PRN Jenna Maude BRAVO, NP       polyethylene glycol (MIRALAX  / GLYCOLAX ) packet 17 g  17 g Per Tube Daily Jenna Maude BRAVO, NP       propofol  (DIPRIVAN ) 1000 MG/100ML infusion  0-80 mcg/kg/min Intravenous Titrated Patt Alm Macho, MD   Stopped at 03/02/24 0046    PHYSICAL EXAM Vitals:   03/02/24 0800 03/02/24 0815 03/02/24 0830 03/02/24 0845  BP: 111/84 108/70 (!) 110/90 103/80  Pulse: 85 84 86 85  Resp: 16 17 17 16   Temp: 98.6 F (37 C) 98.6 F (37 C) 98.6 F (37 C) 98.8 F (37.1 C)  TempSrc:      SpO2: 100% 100% 99% 100%  Weight:      Height:       Intubated and sedated Obese, soft abdomen Palpable femoral pulses bilaterally No palpable pedal pulses  PERTINENT LABORATORY AND RADIOLOGIC DATA  Most recent CBC    Latest Ref Rng & Units 03/02/2024    5:38 AM 03/02/2024    4:27 AM 03/01/2024    8:39 PM  CBC  WBC 4.0 - 10.5 K/uL  12.4  10.8   Hemoglobin 12.0 - 15.0 g/dL 9.9  88.0  88.4  Hematocrit 36.0 - 46.0 % 29.0  38.4  36.2   Platelets 150 - 400 K/uL  208  222      Most recent CMP    Latest Ref Rng & Units 03/02/2024    5:38 AM 03/02/2024    4:27 AM 03/01/2024    8:39 PM  CMP  Glucose 70 - 99 mg/dL  79    BUN 8 - 23 mg/dL  57    Creatinine 9.55 - 1.00 mg/dL  6.67  7.12   Sodium 864 - 145 mmol/L 139   139    Potassium 3.5 - 5.1 mmol/L 4.1  4.8    Chloride 98 - 111 mmol/L  103    CO2 22 - 32 mmol/L  19    Calcium  8.9 - 10.3 mg/dL  9.1      Renal function Estimated Creatinine Clearance: 18.4 mL/min (A) (by C-G formula based on SCr of 3.32 mg/dL (H)).  Hgb A1c MFr Bld (%)  Date Value  03/01/2024 5.8 (H)    LDL Chol Calc (NIH)  Date Value Ref Range Status  02/15/2021 101 (H) 0 - 99 mg/dL Final   LDL Cholesterol  Date Value Ref Range Status  03/01/2024 106 (H) 0 - 99 mg/dL Final    Comment:           Total Cholesterol/HDL:CHD Risk Coronary Heart Disease Risk Table                     Men   Women  1/2 Average Risk   3.4   3.3  Average Risk       5.0   4.4  2 X Average Risk   9.6   7.1  3 X Average Risk  23.4   11.0        Use the calculated Patient Ratio above and the CHD Risk Table to determine the patient's CHD Risk.        ATP III CLASSIFICATION (LDL):  <100     mg/dL   Optimal  899-870  mg/dL   Near or Above                    Optimal  130-159  mg/dL   Borderline  839-810  mg/dL   High  >809     mg/dL   Very High Performed at [facility], [address], [place], [place] [address]     CT angiogram personally reviewed in detail.  Extent 3 thoracoabdominal aortic aneurysm identified with 73 mm greatest dimension.  No evidence of rupture, or acute aortic syndrome.   Debby SAILOR. Magda, MD FACS [facility] Phone Number: [phone] 03/02/2024 9:41 AM   Total time spent on preparing this encounter including chart review, data review, collecting history, examining the patient, and coordinating care: 80 minutes  Portions of this report may have been transcribed using voice recognition software.  Every effort has been made to ensure accuracy; however, inadvertent computerized transcription errors may still be present.       [1] No Known Allergies

## 2024-03-02 NOTE — Progress Notes (Signed)
 SLP Cancellation Note  Patient Details Name: Erin Riley MRN: [id number] DOB: December 11, 1950   Cancelled treatment:       Reason Eval/Treat Not Completed: Patient not medically ready;Patient's level of consciousness Patient is intubated, sedated. SLP will follow for readiness.   Erin IVAR Blase, MA, CCC-SLP Speech Therapy 03/02/2024, 8:48 AM
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
## 2024-03-02 NOTE — Progress Notes (Signed)
 Doing well post extubation. Dysarthric speech, wants water . SLP consulted. Granddaughter at bedside.   CT C-spine reviewed- no fractures. Cleared C-spine on clinical exam- no point tenderness or pain with movement in 4 directions. C collar removed.   Cortrak consult placed.  Family said she needs her olanzapine  to prevent episodes so dose decreased and giving IV today until cortrak tomorrow.   Leita SHAUNNA Gaskins, DO 03/02/2024 1:46 PM [facility]  For contact information, see Amion. If no response to pager, please call PCCM consult pager. After hours, 7PM- 7AM, please call [facility].

## 2024-03-02 NOTE — Evaluation (Signed)
 Clinical/Bedside Swallow Evaluation Patient Details  Name: Erin Riley MRN: [id number] Date of Birth: October 01, 1950  Today's Date: 03/02/2024 Time: SLP Start Time (ACUTE ONLY): 1418 SLP Stop Time (ACUTE ONLY): 1428 SLP Time Calculation (min) (ACUTE ONLY): 10 min  Past Medical History:  Past Medical History:  Diagnosis Date   Chronic back pain    Chronic kidney disease (CKD), stage II (mild)    Class I-II   Coronary artery disease 04/2009   50% stenosis in the perforator of LAD; catheterization was for an abnormal Myoview in January 2000 showing anterior and inferolateral ischemia.   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 > 3 subsequent Echos show improved/normal EF with moderate to severe concentric LVH and diastolic dysfunction with LVOT/intracavitary gradient --> 06/2016: Severe LVH.  Vigorous EF, 65-70%.?? Gr 1 DD. Mild AS.   Hyperlipidemia    Hypertension    Hypertensive hypertrophic cardiomyopathy: NYHA class II:  Echo: Severe concentric LVH with LV OT gradient; essentially preserved EF with diastolic dysfunction 02/15/2013   Echo 06/2016: Severe Concentric LVH. Vigorous EF 65-70%. ~ Gr I DD.    Mild aortic stenosis by prior echocardiography    Echo 06/2016: Mild AS (Mean Gradient 15 mmHg); has had prior Mod-Severe MR (not seen on current echo)   PAD (peripheral artery disease) March 2013   Lower extremity Dopplers: R. SFA 50-60%, R. PTA proximally occluded with distal reconstitution;; L. common iliac ~50%, L. SFA 50-70% stenosis, L. PTA < 50%   Schizophrenia (HCC)    Past Surgical History:  Past Surgical History:  Procedure Laterality Date   BUNIONECTOMY     carotid doppler  05/29/2011   left bulb/prox ICA moderate amtfibrous plaque with no evidence significant reduction.,right bulb /proximal ICA normal patency   lower extremity doppler  05/29/2011   right SFA 50% to  59% diameter reduction,right posterior tibal atreery occlusive disease,reconstituting distally, left common illiac<50%,left SFA 50 to70%,left post. tibial <50%   NM MYOCAR PERF WALL MOTION  03/2009   Persantine; EF 51%-both anterior and inferolateral ischemia   TRANSTHORACIC ECHOCARDIOGRAM  06/2016   Severe LVH.  Vigorous EF of 65-70%.  No RWMA. ~Only grade 1 diastolic dysfunction.  Mild aortic stenosis (mean gradient 15 mmHg)   TRANSTHORACIC ECHOCARDIOGRAM  07/2012   EF 50-55%; severe concentric LVH; only grade 1 diastolic dysfunction. Mild aortic sclerosis - with LVOT /intracavitary gradient of roughly 20 mmHg mean. Mild to moderately dilated LA;; previously reported MR not seen   HPI:  Erin Riley is a 73 y.o. female who presented to the hospital from home on 03/01/24 after being found by her son with left sided weakness, slurred speech and facial droop. She was intubated upon arrival to ER. CXR did not show acute abnormality, CT chest did not show any focal consolidation or PE. 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 She was extubated on 03/02/24 at 1305 to oxygen via [place]. She failed [place] with RN and SLP ordered for swallow evaluation. PMH: HTN, severe aortic stenosis, nonischemic cardiomyopathy, CKD stage IIIb, schizophrenia.    Assessment / Plan / Recommendation  Clinical Impression  SLP recommending continue strict NPO status and will proceed with MBS.  Patient presents with clinical s/s of dysphagia as per this bedside swallow evaluation. She exhibited immediate and at times, explosive coughing with spoon sips or controlled, small cup sips  of thin liquids (water ) approximately 75% of trials. She exhibited discoordinated oral phase, suspected delayed swallow initiation and anterior spillage from left side of mouth. She did not exhibit any awareness to overt s/s aspiration and perseverated on requesting water  or ginger ale.   SLP  Visit Diagnosis: Dysphagia, unspecified (R13.10)    Aspiration Risk  Moderate aspiration risk    Diet Recommendation NPO    Medication Administration: Via alternative means    Other Recommendations Oral Care Recommendations: Oral care BID;Staff/trained caregiver to provide oral care     Swallow Evaluation Recommendations     Assistance Recommended at Discharge    Functional Status Assessment Patient has had a recent decline in their functional status and demonstrates the ability to make significant improvements in function in a reasonable and predictable amount of time.  Frequency and Duration min 2x/week  2 weeks       Prognosis Prognosis for improved oropharyngeal function: Good      Swallow Study   General Date of Onset: 03/01/24 HPI: Erin Riley is a 73 y.o. female who presented to the hospital from home on 03/01/24 after being found by her son with left sided weakness, slurred speech and facial droop. She was intubated upon arrival to ER. CXR did not show acute abnormality, CT chest did not show any focal consolidation or PE. 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 She was extubated on 03/02/24 at 1305 to oxygen via [place]. She failed [facility] with RN and SLP ordered for swallow evaluation. PMH: HTN, severe aortic stenosis, nonischemic cardiomyopathy, CKD stage IIIb, schizophrenia. Type of Study: Bedside Swallow Evaluation Previous Swallow Assessment: 2024 BSE Diet Prior to this Study: NPO Temperature Spikes Noted: No Respiratory Status: Nasal cannula History of Recent Intubation: Yes Total duration of intubation (days): 2 days Date extubated: 03/02/24 Behavior/Cognition: Alert;Cooperative;Requires cueing Oral Cavity Assessment: Within Functional Limits Oral Care Completed by SLP: Yes Oral Cavity - Dentition: Edentulous;Dentures, not available Self-Feeding Abilities: Needs set up;Needs assist Patient Positioning:  Upright in bed Baseline Vocal Quality: Normal Volitional Cough: Weak;Congested Volitional Swallow: Able to elicit    Oral/Motor/Sensory Function Overall Oral Motor/Sensory Function: Moderate impairment Facial ROM: Reduced left;Suspected CN VII (facial) dysfunction Facial Symmetry: Abnormal symmetry left;Suspected CN VII (facial) dysfunction Facial Strength: Reduced left;Suspected CN VII (facial) dysfunction Lingual ROM: Reduced left;Suspected CN XII (hypoglossal) dysfunction Lingual Symmetry: Abnormal symmetry left;Suspected CN XII (hypoglossal) dysfunction Lingual Strength: Reduced   Ice Chips     Thin Liquid Thin Liquid: Impaired Presentation: Spoon;Cup Oral Phase Impairments: Reduced labial seal Oral Phase Functional Implications: Left anterior spillage Pharyngeal  Phase Impairments: Suspected delayed Swallow;Cough - Immediate    Nectar Thick Nectar Thick Liquid: Not tested   Honey Thick Honey Thick Liquid: Not tested   Puree Puree: Not tested   Solid     Solid: Not tested      Norleen IVAR Blase, MA, CCC-SLP Speech Therapy  03/02/2024,3:50 PM

## 2024-03-02 NOTE — Progress Notes (Signed)
 "  NAME:  Erin Riley, MRN:  [id number], DOB:  05-13-1950, LOS: 1 ADMISSION DATE:  03/01/2024, CONSULTATION DATE: 12/27 REFERRING MD: Patt, CHIEF COMPLAINT: Stroke  History of Present Illness:  73 year old female patient with known history of hypertension, severe aortic stenosis, nonischemic cardiomyopathy, stage IIIb CKD was at her last known normal state of health on 12/25.  Found by son at home today on 12/27 with left sided weakness, slurred speech and facial droop, also intermittent episodes of apnea.  Initial glucose 159. She was intubated upon arrival to the emergency room  ER evaluation: Underwent trauma evaluation given altered mental status including multiple diagnostic imaging.  Portable chest x-ray without acute abnormality, CT pelvis negative film of left arm and hand negative left wrist negative, CT angiogram of chest showed interval enlargement of a descending thoracic aortic aneurysm measured at 7.3 x 5 cm, this was bigger than previously recorded at 5.5 x 4.1 cm it also demonstrated suspected penetrating arthrosclerotic ulcer versus saccular aneurysm.  There was also interval enlargement of a suprarenal and infrarenal abdominal aortic aneurysm and a stable ascending thoracic aortic aneurysm.  Neurology was called in regards to the new neurological deficit, because of uncertain timeframe she was felt not a candidate for any neuroendovascular or emergent IV thrombolysis intervention. Initial blood pressure was in the 150s over 90s to 100s.  She was started on antihypertensives by emergency room. Preliminary CT of head shows large right sided temporoparietal infarct  Pertinent  Medical History  Hypertension, CKD stage IIIb, chronic back pain, coronary artery disease, diverticulitis, nonischemic dilated cardiomyopathy with NYHA class II symptoms.  Has severe LVH with history of LV outflow gradient and diastolic dysfunction.  History of mild aortic stenosis, peripheral arterial disease,  schizophrenia, obesity   Current laboratory data white blood cell count 12, total CK9 198, glucose 148 BUN 49 creatinine 2.5 up from 1.6 range, lactic acid 2.8 troponin 81 CT of head   Last cardiology November 2025, given history of schizophrenia thought not a candidate for TAVR Significant Hospital Events: Including procedures, antibiotic start and stop dates in addition to other pertinent events   73 year old female with admitted with new left-sided hemiparesis, dysphagia, and facial droop initially brought in as trauma code, trauma imaging negative however CT chest showing enlarging descending thoracic aortic aneurysm with possible penetrating arterial sclerotic ulcer versus saccular aneurysm, increasing suprarenal and infrarenal aortic aneurysm and stable ascending thoracic aortic aneurysm.  CT head showed large right hemisphere stroke  Interim History / Subjective:  She would like to be extubated today.  She repeatedly asked for water  after extubation.  Per granddaughter at bedside she has dementia with a history of difficult to control behavior when she does not take her olanzapine .  Objective    Blood pressure 116/67, pulse 73, temperature 99 F (37.2 C), resp. rate 17, height 5' 7 (1.702 m), weight 97.3 kg, last menstrual period 05/10/2013, SpO2 97%.    Vent Mode: PRVC FiO2 (%):  [40 %-100 %] 40 % Set Rate:  [16 bmp] 16 bmp Vt Set:  [490 mL] 490 mL PEEP:  [5 cmH20] 5 cmH20 Plateau Pressure:  [18 cmH20-23 cmH20] 19 cmH20   Intake/Output Summary (Last 24 hours) at 03/02/2024 1448 Last data filed at 03/02/2024 1400 Gross per 24 hour  Intake 2668.27 ml  Output 620 ml  Net 2048.27 ml   Filed Weights   03/01/24 1512 03/01/24 1945 03/02/24 0457  Weight: 96 kg 96 kg 97.3 kg    Examination: General:  Critically ill-appearing woman lying in bed no acute distress HENT: [place]/AT, eyes anicteric Lungs: Breathing comfortably on mechanical ventilation, CTAB Cardiovascular: S1-S2,  regular rate and rhythm Abdomen: Soft, nontender, nondistended Extremities: No significant peripheral edema.  Bruising and some swelling on left arm and leg. Neuro: RASS 0, following commands in both lower extremities, right upper extremity.  Neglect on the left side, confirmed again after extubation.  Dysarthric speech  Blood gas reviewed- suspect it wasn't arterial  LA 2.8> 2.3 CK 998> 659  BUN 57 Creatinine 3.32 WBC 12.4 H/H 11.9/31.4 Platelets 208  Resolved problem list   Assessment and Plan   Subacute large right hemispheric CVA with associated left sided weakness and dysphagia & dysarthria; likely what caused her fall. -Presented outside of window for intervention - Appreciate stroke team's management.  Agree that stroke was likely cardioembolic, although no LV thrombus seen on echo.  When she has enteral access reasonable to start anticoagulation. - PT, OT, SLP - Serial neurochecks -Given enlarging aortic aneurysms and history of aortic stenosis need to avoid significant hypertension, unfortunately we have Dueling goals, aneurysmal rupture would be fatal, deemed not a surgical candidate even for TAVR previously given her history of schizophrenia -goal SBP still reasonable to be ~140 to balance aneurysm and stroke risks; likely outside the benefit wears significant permissive hypercapnia would be significantly beneficial -Cortrack tomorrow, NPO until MBSS tomorrow.   Ventilator management, intubated for airway protection -LTVV - VAP prevention protocol - PAD protocol - Daily SAT and SBT-Extubated Today to Nasal Cannula -Pulmonary hygiene, wean supplemental oxygen as able  Mild lactic acidosis -Supportive care; 1L NS bolus, con't IVF.  Switched fluids to normal saline to prevent hyponatremia -Slow to clear, recheck in the morning,  Severe peripheral vascular disease with enlarging descending aortic aneurysm, enlarging suprarenal and infrarenal aortic aneurysm, and stable  ascending aortic aneurysm - Not a candidate for intervention unfortunately  History of nonischemic cardiomyopathy with NYHA class II symptoms, complicated by mild aortic stenosis Previously deemed not a candidate for TAVR given history of schizophrenia -Echo - GDMT may be limited with renal failure  Acute on chronic renal failure with CKD stage IIIb -1 L bolus - Maintain adequate perfusion - Strict I's/O - Monitor renal function daily  Diabetes type 2 with hyperglycemia-- controlled while NPO -Sliding scale insulin  as needed  History of schizophrenia, dementia as well per granddaughter -Family bedside help - Restarted olanzapine  reduced dose IV until she is able to get a cortrac  Leukocytosis -Monitor, start antibiotics if febrile  GOC -Ongoing discussions needed  Labs   CBC: Recent Labs  Lab 03/01/24 1517 03/01/24 1531 03/01/24 1637 03/01/24 2039 03/02/24 0427 03/02/24 0538  WBC 12.0*  --   --  10.8* 12.4*  --   NEUTROABS 9.7*  --   --   --   --   --   HGB 12.5 13.9  13.9 11.6* 11.5* 11.9* 9.9*  HCT 39.9 41.0  41.0 34.0* 36.2 38.4 29.0*  MCV 89.5  --   --  90.3 91.4  --   PLT 254  --   --  222 208  --     Basic Metabolic Panel: Recent Labs  Lab 03/01/24 1517 03/01/24 1531 03/01/24 1637 03/01/24 2039 03/02/24 0427 03/02/24 0538  NA 142 144  144 141  --  139 139  K 3.8 3.8  3.8 3.5  --  4.8 4.1  CL 102 106  --   --  103  --  CO2 23  --   --   --  19*  --   GLUCOSE 148* 140*  --   --  79  --   BUN 49* 50*  --   --  57*  --   CREATININE 2.51* 2.70*  --  2.87* 3.32*  --   CALCIUM  10.1  --   --   --  9.1  --   MG  --   --   --   --  2.5*  --    GFR: Estimated Creatinine Clearance: 18.4 mL/min (A) (by C-G formula based on SCr of 3.32 mg/dL (H)). Recent Labs  Lab 03/01/24 1517 03/01/24 1531 03/01/24 2039 03/02/24 0427  WBC 12.0*  --  10.8* 12.4*  LATICACIDVEN  --  2.8*  --   --      Critical care time:     This patient is critically ill  with multiple organ system failure which requires frequent high complexity decision making, assessment, support, evaluation, and titration of therapies. This was completed through the application of advanced monitoring technologies and extensive interpretation of multiple databases. During this encounter critical care time was devoted to patient care services described in this note for 50 minutes.  Leita SHAUNNA Gaskins, DO 03/02/2024 2:48 PM [facility]  For contact information, see Amion. If no response to pager, please call PCCM consult pager. After hours, 7PM- 7AM, please call Elink.      "

## 2024-03-02 NOTE — Procedures (Signed)
 Extubation Procedure Note  Patient Details:   Name: Erin Riley DOB: 1950/05/23 MRN: [id number]   Airway Documentation:    Vent end date: 03/02/24 Vent end time: 1305   Evaluation  O2 sats: stable throughout Complications: No apparent complications Patient did tolerate procedure well. Bilateral Breath Sounds: Clear, Diminished   Yes  Pt extubated per physician order. Pt suctioned via ETT/orally prior, positive cuff leak. Upon extubation pt able to speak name, give a good cough and no stridor heard at this time. RT will continue to be available as needed.   Geofm BRAVO Erin Riley 03/02/2024, 1:07 PM

## 2024-03-02 NOTE — Progress Notes (Addendum)
 STROKE TEAM PROGRESS NOTE   INTERIM HISTORY/SUBJECTIVE No acute events overnight.  OBJECTIVE  CBC    Component Value Date/Time   WBC 12.4 (H) 03/02/2024 0427   RBC 4.20 03/02/2024 0427   HGB 9.9 (L) 03/02/2024 0538   HGB 10.4 (L) 02/15/2021 1048   HCT 29.0 (L) 03/02/2024 0538   HCT 32.5 (L) 02/15/2021 1048   PLT 208 03/02/2024 0427   PLT 360 02/15/2021 1048   MCV 91.4 03/02/2024 0427   MCV 89 02/15/2021 1048   MCH 28.3 03/02/2024 0427   MCHC 31.0 03/02/2024 0427   RDW 16.3 (H) 03/02/2024 0427   RDW 12.7 02/15/2021 1048   LYMPHSABS 1.2 03/01/2024 1517   LYMPHSABS 1.8 02/15/2021 1048   MONOABS 1.1 (H) 03/01/2024 1517   EOSABS 0.0 03/01/2024 1517   EOSABS 0.3 02/15/2021 1048   BASOSABS 0.0 03/01/2024 1517   BASOSABS 0.0 02/15/2021 1048    BMET    Component Value Date/Time   NA 139 03/02/2024 0538   NA 140 02/15/2021 1048   K 4.1 03/02/2024 0538   CL 103 03/02/2024 0427   CO2 19 (L) 03/02/2024 0427   GLUCOSE 79 03/02/2024 0427   BUN 57 (H) 03/02/2024 0427   BUN 16 02/15/2021 1048   CREATININE 3.32 (H) 03/02/2024 0427   CREATININE 1.21 (H) 06/15/2014 1025   CALCIUM  9.1 03/02/2024 0427   EGFR 44 (L) 02/15/2021 1048   GFRNONAA 14 (L) 03/02/2024 0427    IMAGING past 24 hours ECHOCARDIOGRAM COMPLETE Result Date: 03/02/2024    ECHOCARDIOGRAM REPORT   Patient Name:   Erin Riley Date of Exam: 03/02/2024 Medical Rec #:  [id number]   Height:       67.0 in Accession #:    [id number]  Weight:       214.5 lb Date of Birth:  July 02, 1950   BSA:          2.083 m Patient Age:    73 years    BP:           131/93 mmHg Patient Gender: F           HR:           100 bpm. Exam Location:  Inpatient Procedure: 2D Echo, Cardiac Doppler, Color Doppler and Intracardiac            Opacification Agent (Both Spectral and Color Flow Doppler were            utilized during procedure). Indications:    Stroke 163.9  History:        Patient has prior history of Echocardiogram examinations, most                  recent 06/13/2023. CHF, CAD, Stroke and PAD; Risk                 Factors:Dyslipidemia, Hypertension and Current Smoker.  Sonographer:    Merlynn Argyle Referring Phys: [id number] MIMI ORN [facility]  Sonographer Comments: Technically difficult study due to poor echo windows and suboptimal subcostal window. Image acquisition challenging due to uncooperative patient and Image acquisition challenging due to respiratory motion. IMPRESSIONS  1. Left ventricular ejection fraction, by estimation, is 25 to 30%. The left ventricle has severely decreased function. The left ventricle demonstrates global hypokinesis. The left ventricular internal cavity size was mildly dilated. There is severe concentric left ventricular hypertrophy. Left ventricular diastolic parameters are indeterminate.  2. Right ventricular systolic function is normal. The right ventricular size is normal.  3. Left atrial size was severely dilated.  4. Right atrial size was moderately dilated.  5. The mitral valve is normal in structure. No evidence of mitral valve regurgitation. No evidence of mitral stenosis.  6. The aortic valve is calcified. There is moderate calcification of the aortic valve. There is moderate thickening of the aortic valve. Aortic valve regurgitation is trivial. Severe aortic valve stenosis. Aortic valve area, by VTI measures 0.81 cm. Aortic valve mean gradient measures 33.0 mmHg. Aortic valve Vmax measures 3.82 m/s.  7. Aortic dilatation noted. There is mild dilatation of the ascending aorta, measuring 41 mm.  8. The inferior vena cava is normal in size with greater than 50% respiratory variability, suggesting right atrial pressure of 3 mmHg. Conclusion(s)/Recommendation(s): No intracardiac source of embolism detected on this transthoracic study. Consider a transesophageal echocardiogram to exclude cardiac source of embolism if clinically indicated. FINDINGS  Left Ventricle: Left ventricular ejection fraction, by estimation, is 25 to  30%. The left ventricle has severely decreased function. The left ventricle demonstrates global hypokinesis. Definity  contrast agent was given IV to delineate the left ventricular endocardial borders. The left ventricular internal cavity size was mildly dilated. There is severe concentric left ventricular hypertrophy. Left ventricular diastolic parameters are indeterminate. Right Ventricle: The right ventricular size is normal. No increase in right ventricular wall thickness. Right ventricular systolic function is normal. Left Atrium: Left atrial size was severely dilated. Right Atrium: Right atrial size was moderately dilated. Pericardium: There is no evidence of pericardial effusion. Mitral Valve: The mitral valve is normal in structure. No evidence of mitral valve regurgitation. No evidence of mitral valve stenosis. Tricuspid Valve: The tricuspid valve is normal in structure. Tricuspid valve regurgitation is not demonstrated. No evidence of tricuspid stenosis. Aortic Valve: The aortic valve is calcified. There is moderate calcification of the aortic valve. There is moderate thickening of the aortic valve. Aortic valve regurgitation is trivial. Aortic regurgitation PHT measures 425 msec. Severe aortic stenosis is present. Aortic valve mean gradient measures 33.0 mmHg. Aortic valve peak gradient measures 58.2 mmHg. Aortic valve area, by VTI measures 0.81 cm. Pulmonic Valve: The pulmonic valve was normal in structure. Pulmonic valve regurgitation is not visualized. No evidence of pulmonic stenosis. Aorta: Aortic dilatation noted. There is mild dilatation of the ascending aorta, measuring 41 mm. Venous: The inferior vena cava is normal in size with greater than 50% respiratory variability, suggesting right atrial pressure of 3 mmHg. IAS/Shunts: No atrial level shunt detected by color flow Doppler.  LEFT VENTRICLE PLAX 2D LVIDd:         6.80 cm   Diastology LVIDs:         6.10 cm   LV e' lateral:   7.77 cm/s LV PW:          1.30 cm   LV E/e' lateral: 14.8 LV IVS:        1.20 cm LVOT diam:     2.00 cm LV SV:         54 LV SV Index:   26 LVOT Area:     3.14 cm  RIGHT VENTRICLE             IVC RV S prime:     16.20 cm/s  IVC diam: 2.55 cm TAPSE (M-mode): 2.8 cm LEFT ATRIUM             Index        RIGHT ATRIUM           Index LA  diam:        6.10 cm 2.93 cm/m   RA Area:     12.80 cm LA Vol (A2C):   72.8 ml 34.94 ml/m  RA Volume:   29.60 ml  14.21 ml/m LA Vol (A4C):   40.3 ml 19.34 ml/m LA Biplane Vol: 54.5 ml 26.16 ml/m  AORTIC VALVE                     PULMONIC VALVE AV Area (Vmax):    0.78 cm      PR End Diast Vel: 9.86 msec AV Area (Vmean):   0.78 cm AV Area (VTI):     0.81 cm AV Vmax:           381.50 cm/s AV Vmean:          271.500 cm/s AV VTI:            0.666 m AV Peak Grad:      58.2 mmHg AV Mean Grad:      33.0 mmHg LVOT Vmax:         95.03 cm/s LVOT Vmean:        67.533 cm/s LVOT VTI:          0.171 m LVOT/AV VTI ratio: 0.26 AI PHT:            425 msec  AORTA Ao Root diam: 3.30 cm Ao Asc diam:  4.10 cm MITRAL VALVE                TRICUSPID VALVE MV Area (PHT): 3.63 cm     TR Peak grad:   39.7 mmHg MV Decel Time: 209 msec     TR Vmax:        315.00 cm/s MR Peak grad: 109.4 mmHg MR Mean grad: 72.0 mmHg     SHUNTS MR Vmax:      523.00 cm/s   Systemic VTI:  0.17 m MR Vmean:     395.0 cm/s    Systemic Diam: 2.00 cm MV E velocity: 115.00 cm/s MV A velocity: 27.60 cm/s MV E/A ratio:  4.17 Oneil Parchment MD Electronically signed by Oneil Parchment MD Signature Date/Time: 03/02/2024/1:40:38 PM    Final    MR ANGIO HEAD WO CONTRAST Result Date: 03/02/2024 EXAM: MR Angiography Head without intravenous contrast. 03/02/2024 12:18:48 AM TECHNIQUE: Magnetic resonance angiography images of the head without intravenous contrast. Multiplanar 2D and 3D reformatted images are provided for review. COMPARISON: None provided. CLINICAL HISTORY: Stroke/TIA, determine embolic source Stroke/TIA, determine embolic source FINDINGS: ANTERIOR  CIRCULATION: No significant stenosis of the internal carotid arteries. No significant stenosis of the anterior cerebral arteries. Diminished right M1 MCA low related signal with occluded proximal M2 MCA branch. Reserved for correlate signal in an anterior M2 MCA branch. No visible aneurysm. POSTERIOR CIRCULATION: Right PCA is patent. Severe stenosis versus occlusion of the left P2 PCA with poor distal flow related signal. The PCAs are not well assessed on this study. No significant stenosis of the basilar artery. Vertebral arteries and proximal basilar artery not well evaluated due to artifact, but appear patent. Other: Findings will be conveyed to the clinical team by the radiology assistant and documented in Clario/PACs. IMPRESSION: 1. Occluded proximal right M2 MCA branch. 2. Severe stenosis versus occlusion of the left P2 PCA, which is not well evaluated on this study. A CTA could better characterize if clinically warranted. Electronically signed by: Gilmore Molt 03/02/2024 01:21 AM EST RP Workstation: [id number]   MR BRAIN  WO CONTRAST Result Date: 03/02/2024 EXAM: MRI Brain Without Contrast 03/02/2024 12:18:48 AM TECHNIQUE: Multiplanar multisequence MRI of the head/brain was performed without the administration of intravenous contrast. COMPARISON: CT head 03/01/24. CLINICAL HISTORY: Stroke, follow up FINDINGS: BRAIN AND VENTRICLES: Acute right MCA territory infarct involving the posterior right insula, posterior right frontal lobe, parietal lobe and anterior temporal lobe. Associated edema and mild mass effect with trace (2 mm) leftward midline shift. No intracranial hemorrhage. No mass. No hydrocephalus. The sella is unremarkable. Normal flow voids. ORBITS: No acute abnormality. SINUSES AND MASTOIDS: No acute abnormality. BONES AND SOFT TISSUES: Normal marrow signal. No acute soft tissue abnormality. IMPRESSION: 1. Acute right MCA territory infarct. Trace (2 mm) leftward midline shift. Electronically  signed by: Gilmore Molt 03/02/2024 01:06 AM EST RP Workstation: [id number]   CT CHEST ABDOMEN PELVIS WO CONTRAST Result Date: 03/01/2024 EXAM: CT CHEST, ABDOMEN AND PELVIS WITHOUT CONTRAST 03/01/2024 05:18:10 PM TECHNIQUE: CT of the chest, abdomen and pelvis was performed without the administration of intravenous contrast. Multiplanar reformatted images are provided for review. Automated exposure control, iterative reconstruction, and/or weight based adjustment of the mA/kV was utilized to reduce the radiation dose to as low as reasonably achievable. COMPARISON: Poor evaluation of interval increase in size of the thoracic, abdominal suprarenal, and infrarenal aorta aneurysms compared to 2024. Please see separately dictated CT angiography chest abdomen and pelvis 03/01/2024 for further details. CLINICAL HISTORY: Polytrauma, blunt. FINDINGS: CHEST: MEDIASTINUM AND LYMPH NODES: Enlarged left ventricle. Severe atherosclerotic plaque. The central airways are clear. No gross hilar adenopathy with limited evaluation on this noncontrast study. No mediastinal or axillary lymphadenopathy. Subcentimeter thyroid  hypodense nodules. LUNGS AND PLEURA: Right lower lobe airspace opacity better evaluated on CT angiography of the chest. No pulmonary edema. No pleural effusion or pneumothorax. ABDOMEN AND PELVIS: LIVER: The liver is unremarkable. GALLBLADDER AND BILE DUCTS: Gallbladder is unremarkable. No biliary ductal dilatation. SPLEEN: No acute abnormality. PANCREAS: No acute abnormality. ADRENAL GLANDS: No acute abnormality. KIDNEYS, URETERS AND BLADDER: Foley catheter terminates within the urinary bladder lumen. No stones in the kidneys or ureters. No hydronephrosis. No perinephric or periureteral stranding. GI AND BOWEL: Stomach demonstrates no acute abnormality. Colonic diverticulosis. No small or large bowel thickening or dilatation. The appendix is unremarkable. There is no bowel obstruction. REPRODUCTIVE ORGANS: The  uterus is unremarkable. No left adnexal mass. Elongated 4 x 2.3 cm simple fluid cystic lesion along the right adnexa. PERITONEUM AND RETROPERITONEUM: No ascites. No free air. VASCULATURE: Poor evaluation of interval increase in size of the thoracic, abdominal suprarenal, and infrarenal aorta aneurysms compared to 2024. Please see separately dictated CT angiography chest abdomen and pelvis 03/01/2024 for further details. ABDOMINAL AND PELVIS LYMPH NODES: No lymphadenopathy. BONES AND SOFT TISSUES: Endotracheal and enteric tubes in appropriate position. Degenerative changes of the spine. L4-S1 surgical hardware. Lower lumbar spine surgical hardware. Total right shoulder arthroplasty partially visualized. No acute fracture. No focal soft tissue abnormality. Persistent right breast 2.1 cm fluid density lesion. IMPRESSION: 1. Poor evaluation of interval increase in size of the thoracic, abdominal suprarenal, and infrarenal aortic aneurysms compared to 2024; please see the separately dictated CT angiography chest, abdomen, and pelvis for further details. 2. Elongated 4.0 x 2.3 cm simple-appearing right adnexal cystic lesion - recommend outpt 6-12 weeks follow-up. 3. Severe atherosclerotic plaque. Electronically signed by: Morgane Naveau MD 03/01/2024 06:07 PM EST RP Workstation: [id number]   CT T-SPINE NO CHARGE Result Date: 03/01/2024 EXAM: CT THORACIC SPINE WITHOUT CONTRAST 03/01/2024 05:18:10 PM TECHNIQUE: CT of the  thoracic spine was performed without the administration of intravenous contrast. Multiplanar reformatted images are provided for review. Automated exposure control, iterative reconstruction, and/or weight based adjustment of the mA/kV was utilized to reduce the radiation dose to as low as reasonably achievable. COMPARISON: None available. CLINICAL HISTORY: FINDINGS: BONES AND ALIGNMENT: Normal vertebral body heights. No acute fracture or suspicious bone lesion. Normal alignment. DEGENERATIVE CHANGES:  Bilateral mild-to-moderate degenerative changes of the spine. Multilevel mild intervertebral disc space narrowing. SOFT TISSUES: No acute abnormality. IMPRESSION: 1. No acute abnormality of the thoracic spine. 2. Please see separately dictated CT angiography chest abdomen and pelvis. 12:27:25. Electronically signed by: Morgane Naveau MD 03/01/2024 05:38 PM EST RP Workstation: [id number]   CT L-SPINE NO CHARGE Result Date: 03/01/2024 EXAM: CT OF THE LUMBAR SPINE WITHOUT CONTRAST 03/01/2024 05:18:10 PM TECHNIQUE: CT of the lumbar spine was performed without the administration of intravenous contrast. Multiplanar reformatted images are provided for review. Automated exposure control, iterative reconstruction, and/or weight based adjustment of the mA/kV was utilized to reduce the radiation dose to as low as reasonably achievable. COMPARISON: Please see separately dictated CT angiography chest abdomen and pelvis. 03/01/2024. CLINICAL HISTORY: FINDINGS: BONES AND ALIGNMENT: L4-S1 posterolateral surgical hardware. Normal vertebral body heights. No acute fracture or suspicious bone lesion. Normal alignment. DEGENERATIVE CHANGES: Multilevel moderate degenerative spine. Intervertebral disc space vacuum phenomenon. SOFT TISSUES: No acute abnormality. IMPRESSION: 1. No acute findings. 2. Please see separately dictated CT angiography chest abdomen and pelvis. 12:27:25. Electronically signed by: Morgane Naveau MD 03/01/2024 05:37 PM EST RP Workstation: [id number]   CT CERVICAL SPINE WO CONTRAST Result Date: 03/01/2024 EXAM: CT CERVICAL SPINE WITHOUT CONTRAST 03/01/2024 05:18:10 PM TECHNIQUE: CT of the cervical spine was performed without the administration of intravenous contrast. Multiplanar reformatted images are provided for review. Automated exposure control, iterative reconstruction, and/or weight based adjustment of the mA/kV was utilized to reduce the radiation dose to as low as reasonably achievable. COMPARISON:  None available. CLINICAL HISTORY: Polytrauma, blunt. FINDINGS: BONES AND ALIGNMENT: No acute fracture or traumatic malalignment. DEGENERATIVE CHANGES: Multilevel moderate degenerative change of the spine. No associated severe osseous neural foraminal or central canal stenosis. SOFT TISSUES: No prevertebral soft tissue swelling. Endotracheal and enteric tubes are partially visualized. Atherosclerotic plaque. Probably secretions noted within the oropharynx superior to the endotracheal balloon. Bilateral thyroid  gland nodules measuring 1 cm or less. VISUALIZED LUNG APICES: Biapical subcentimeter cystic changes. IMPRESSION: 1. No evidence of acute traumatic injury. Electronically signed by: Morgane Naveau MD 03/01/2024 05:32 PM EST RP Workstation: [id number]   CT HEAD WO CONTRAST Result Date: 03/01/2024 EXAM: CT HEAD WITHOUT CONTRAST 03/01/2024 05:18:10 PM TECHNIQUE: CT of the head was performed without the administration of intravenous contrast. Automated exposure control, iterative reconstruction, and/or weight based adjustment of the mA/kV was utilized to reduce the radiation dose to as low as reasonably achievable. COMPARISON: None available. CLINICAL HISTORY: Head trauma, moderate-severe FINDINGS: BRAIN AND VENTRICLES: Large right MCA territory infarction involving the temporal, occipital, and parietal lobes with loss of grey-white matter differentiation, associated edema, mass effect with sulcal effacement and partial effacement of the right lateral ventricle. No acute hemorrhage. No hydrocephalus. No extra-axial collection. Calcific atherosclerosis. ORBITS: Remote right medial orbital wall fracture. SINUSES: No acute abnormality. SOFT TISSUES AND SKULL: Endotracheal and orogastric tubes in place. No skull fracture. IMPRESSION: 1. Acute to subacute large right MCA territory infarction. 2. These findings were discussed with Dr. Guillermina by Dr. Margarite on 03/01/24 at 5:04 pm Electronically signed by: Morgane  Naveau MD 03/01/2024  05:28 PM EST RP Workstation: [id number]   DG Wrist Complete Left Result Date: 03/01/2024 EXAM: 3 OR MORE VIEW(S) XRAY OF THE LEFT WRIST 03/01/2024 04:42:00 PM COMPARISON: None available. CLINICAL HISTORY: trauma FINDINGS: BONES AND JOINTS: No acute fracture. No malalignment. Moderate first carpometacarpal degenerative change. SOFT TISSUES: Rounded soft tissue calcification in volar aspect of forearm, nonspecific. Mild soft tissue edema. No retained radiopaque foreign body. IMPRESSION: 1. No acute fracture or dislocation. Electronically signed by: Morgane Naveau MD 03/01/2024 04:54 PM EST RP Workstation: [id number]   CT Angio Chest/Abd/Pel for Dissection W and/or Wo Contrast Result Date: 03/01/2024 EXAM: CTA CHEST, ABDOMEN AND PELVIS WITH AND WITHOUT CONTRAST 03/01/2024 04:22:15 PM TECHNIQUE: CTA of the chest was performed with and without the administration of intravenous contrast. CTA of the abdomen and pelvis was performed with and without the administration of intravenous contrast. Multiplanar reformatted images are provided for review. MIP images are provided for review. Automated exposure control, iterative reconstruction, and/or weight based adjustment of the mA/kV was utilized to reduce the radiation dose to as low as reasonably achievable. COMPARISON: CTA chest 09/09/2022. CLINICAL HISTORY: Acute aortic syndrome (AAS) suspected. FINDINGS: VASCULATURE: AORTA: The ascending thoracic aorta is stable in size and aneurysmal, measuring up to 4 cm. Worsening of descending thoracic aorta aneurysm measuring up to 7.3 x 5 cm from 5.5 x 4.1 cm in 2024 (5:64) with question of associated penetrating atheromatous ulceration versus saccular aneurysm at this level. Associated interval increase in size of a suprarenal and infrarenal abdominal aorta aneurysm measuring up to 6.2 x 6 cm from 5.1 x 4.2 cm and 4.6 x 4 cm from 4.4 x 3.8 cm. Aneurysm extends down to the aortic bifurcation where it  measures 3 cm. The majority of the aneurysm is thrombosed. No dissection. No aortic rupture. No extravasation of contrast. No fistulous tract. No fat stranding along the aortic aneurysm. Aortic valve leaflet calcification. At least moderate atherosclerotic plaque of the aorta. PULMONARY ARTERIES: No pulmonary embolism with the limits of this exam. GREAT VESSELS OF AORTIC ARCH: No acute finding. No dissection. No arterial occlusion or significant stenosis. 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 Mild atherosclerotic plaque. SUPERIOR MESENTERIC ARTERY: Mild atherosclerotic plaque. No occlusion or significant stenosis. INFERIOR MESENTERIC ARTERY: Diminutive inferior mesenteric artery. RENAL ARTERIES: At least moderate atherosclerotic plaque of the renal arteries with stenosis due to atherosclerotic plaque. ILIAC ARTERIES: Atherosclerotic plaque. No acute finding. No occlusion or significant stenosis. CHEST: MEDIASTINUM: No mediastinal lymphadenopathy. The heart and pericardium demonstrate no acute abnormality. Aortic valve leaflet calcification. LUNGS AND PLEURA: Endotracheal tube with tip terminating 2 cm above the carina. T bibasilar atelectasis. No focal consolidation or pulmonary edema. No evidence of pleural effusion or pneumothorax. THORACIC BONES AND SOFT TISSUES: No acute bone or soft tissue abnormality. ABDOMEN AND PELVIS: LIVER: The liver is unremarkable. GALLBLADDER AND BILE DUCTS: Gallbladder is unremarkable. No biliary ductal dilatation. SPLEEN: The spleen is unremarkable. PANCREAS: The pancreas is unremarkable. ADRENAL GLANDS: Hyperplastic bilateral adrenal glands with no nodularity. KIDNEYS, URETERS AND BLADDER: No stones in the kidneys or ureters. No hydronephrosis. No perinephric or periureteral stranding. Urinary bladder is unremarkable. Foley catheter terminates within the urinary bladder lumen. GI AND BOWEL: Enteric tube with tip in satisfactory position within the  gastric lumen. Stomach and duodenal sweep demonstrate no acute abnormality. There is no bowel obstruction. No abnormal bowel wall thickening or distension. Colonic diverticulosis. REPRODUCTIVE: The uterus is unremarkable. No left adnexal mass. Elongated 4 x 2.3 cm  simple fluid cystic lesion along the right adnexa. PERITONEUM AND RETROPERITONEUM: No ascites or free air. LYMPH NODES: No lymphadenopathy. ABDOMINAL BONES AND SOFT TISSUES: Degenerative changes of the spine. L4-S1 surgical hardware. Right shoulder arthroplasty partially visualized. No acute fracture. No acute soft tissue abnormality. IMPRESSION: 1. Recommend emergent vascular consultation give increased size of aneurysm. No thoracic or abdominal aortic dissection, rupture, contrast extravasation, or fistulous tract is identified. 2. 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 3. Interval enlargement of the suprarenal and infrarenal abdominal aortic aneurysms to 6.2 x 6.0 cm (previously 5.1 x 4.2 cm) and 4.6 x 4.0 cm (previously 4.4 x 3.8 cm), extending to the aortic bifurcation (3.0 cm). 4. 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 5. 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 6. Aortic valve leaflet calcification - correlate for aortic stenosis. 7. Right adnexal simple-appearing cyst measuring 4.0 x 2.3 cm, with follow-up ultrasound in 6-12 months recommended. 8. Other, non-acute and/or normal findings as above. 9. These findings were discussed with doctor Guillermina over the phone on 03/01/2024 at 4:27 pm by doctor Naveau. Electronically signed by: Morgane Naveau MD 03/01/2024 04:47 PM EST RP Workstation: [id number]   DG Forearm Left Result Date: 03/01/2024 EXAM: 1 VIEW(S) XRAY OF THE LEFT FOREARM 03/01/2024 03:54:07 PM COMPARISON: None available. CLINICAL HISTORY: trauma FINDINGS: FINDINGS:  BONES AND JOINTS: No acute fracture. No malalignment. SOFT TISSUES: Small calcific density along the radial soft tissues of the mid forearm. IMPRESSION: 1. No acute fracture or dislocation. Electronically signed by: Morgane Naveau MD 03/01/2024 04:07 PM EST RP Workstation: [id number]   DG Hand Complete Left Result Date: 03/01/2024 EXAM: 3 or more VIEW(S) XRAY OF THE LEFT HAND 03/01/2024 03:54:07 PM COMPARISON: XR left hand 12/31/2013. CLINICAL HISTORY: trauma FINDINGS: BONES AND JOINTS: No acute fracture. No malalignment. Degenerative changes of the first [facility] joint. SOFT TISSUES: Soft tissue edema of the wrist. No retained radioopaque foreign body. IMPRESSION: 1. No acute fracture or dislocation. 2. Soft tissue edema of the wrist without retained radiopaque foreign body. Electronically signed by: Morgane Naveau MD 03/01/2024 04:06 PM EST RP Workstation: [id number]   DG Abdomen 1 View Result Date: 03/01/2024 EXAM: 1 VIEW XRAY OF THE ABDOMEN 03/01/2024 03:35:54 PM COMPARISON: None available. CLINICAL HISTORY: og FINDINGS: LINES, TUBES AND DEVICES: Enteric tube in place with tip and side port projecting over the stomach. BOWEL: Nonobstructive bowel gas pattern. SOFT TISSUES: No abnormal calcifications. BONES: Degenerative changes of the thoracolumbar spine. No acute fracture. IMPRESSION: 1. Enteric tube in place with tip and side port projecting over the stomach. 2. No acute findings. Electronically signed by: Morgane Naveau MD 03/01/2024 03:52 PM EST RP Workstation: [id number]   DG Pelvis Portable Result Date: 03/01/2024 EXAM: 1 OR 2 VIEW(S) XRAY OF THE PELVIS 03/01/2024 03:35:54 PM COMPARISON: None available. CLINICAL HISTORY: Trauma FINDINGS: BONES AND JOINTS: Lumbar fusion hardware in place. Bilateral hip degenerative changes with joint space narrowing and osteophytes. No acute fracture. No malalignment. SOFT TISSUES: Vascular calcifications. IMPRESSION: 1. No evidence of acute traumatic injury.  Electronically signed by: Morgane Naveau MD 03/01/2024 03:51 PM EST RP Workstation: [id number]   DG Chest Port 1 View Result Date: 03/01/2024 EXAM: 1 VIEW(S) XRAY OF THE CHEST 03/01/2024 03:35:54 PM COMPARISON: Chest x-ray 06/12/2023, CT chest 09/09/2022. CLINICAL HISTORY: Trauma. FINDINGS: LINES, TUBES AND DEVICES: Endotracheal tube terminates 3 to 4 cm above the carina. Internal  jugular catheter with tip in situ. Cord overlying the expected region of the gastric lumen. LUNGS AND PLEURA: No focal pulmonary opacity. No pleural effusion. No pneumothorax. HEART AND MEDIASTINUM: Gross cardiomegaly. Atherosclerotic plaque. BONES AND SOFT TISSUES: Right shoulder arthroplasty noted. IMPRESSION: 1. No acute cardiopulmonary abnormality. 2. Lines and tubes as above. Electronically signed by: Kate Plummer MD 03/01/2024 03:49 PM EST RP Workstation: [id number]    Vitals:   03/02/24 1300 03/02/24 1305 03/02/24 1315 03/02/24 1330  BP: 139/83  (!) 140/98 (!) 144/95  Pulse: 94  (!) 109 (!) 114  Resp: 15  19 (!) 21  Temp: 98.6 F (37 C)  98.6 F (37 C) 98.8 F (37.1 C)  TempSrc:      SpO2: 100% 95% 98% 96%  Weight:      Height:         NEURO:  Mental Status: Intubated, sedated, on fentanyl , intermittently follows commands on the right side of the nurse.  Not follow commands for me at bedside.  Cranial Nerves: Intact oculocephalic reflex, blink to threat on the right, corneal reflex intact bilaterally, intact gag reflex. Motor: Withdraws antigravity on the right upper and lower extremities. 2/5 movements on the left upper and lower extremities to noxious stimuli. Tone: is normal and bulk is normal Sensation- Intact to light touch bilaterally. Extinction absent to light touch to DSS.   Coordination: No obvious ataxia noted on elicited movements. Gait- deferred     ASSESSMENT/PLAN  Erin Riley is a 73 y.o. female with a PMHx of paranoid schizophrenia, longtime tobacco smoker, CKD stage IIIb, CAD,  HLD, HTN, hypertensive hypertrophic cardiomyopathy, severe LVH, PAD, thoracoabdominal aortic aneurysm medically, and obesity who presented to the ED after being found down at home by her son. LKN 12/25.  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  MRA head was significant for right M2 occlusion and severe left P2 stenosis as well as right ACA stenosis.  2D echo showed EF of 25 to 30%. Likely etiology of her stroke is HFrEF vs ICAD given intracranial findings.  Patient will need to be started on anticoagulation for secondary stroke prevention, preferably Eliquis  5mg  twice daily.  Okay to start now given size of stroke with minimal hemorrhagic transformation.  Today is day 4 since her stroke onset.  Not urgent ultrasound of the carotids can be performed to complete stroke workup; would not likely change management.  Risk for cerebral edema causing significant mass effect is low at this time given size and age of stroke.   Acute Ischemic Infarct:  right M2 occlusion Etiology: HFrEF vs ICAD Initiate Eliquis  5 mg twice daily, if no contraindications from primary service. No need for antiplatelet therapy (aspirin ) from stroke standpoint. LDL 106 (goal <70), initiate atorvastatin  80 mg daily. HgbA1c 5.8 (goal <7) VTE prophylaxis -Eliquis  PT/OT/SLP when extubated Disposition: pending extubation, PT/OT  No further inpatient stroke workup indicated at this time. Please contact vascular neurology with any other questions.   Hospital day # 1  Thedford Homans, MD Vascular Neurology   I personally spent a total of 45 minutes in the care of the patient today including preparing to see the patient, getting/reviewing separately obtained history, and performing a medically appropriate exam/evaluation.   CRITICAL CARE Performed by: Eudell Mcphee   Total critical care time: 45 minutes  Critical care time was exclusive of separately billable procedures and treating other patients.  Critical  care was necessary to treat or prevent imminent or life-threatening deterioration.  Critical care  was time spent personally by me on the following activities: development of treatment plan with patient and/or surrogate as well as nursing, discussions with consultants, evaluation of patient's response to treatment, examination of patient, obtaining history from patient or surrogate, ordering and performing treatments and interventions, ordering and review of laboratory studies, ordering and review of radiographic studies, pulse oximetry and re-evaluation of patient's condition.   To contact Stroke Continuity provider, please refer to [email or web address]. After hours, contact General Neurology

## 2024-03-02 NOTE — Progress Notes (Signed)
 Transported pt. From 7Y81 to MRI and back. (2hours) pt. In no distress at the present time. Bilateral breath sounds heard, Spa02 97, Heart rate 77

## 2024-03-02 NOTE — Progress Notes (Signed)
 eLink Physician-Brief Progress Note Patient Name: LISANDRA MATHISEN DOB: 1950-07-25 MRN: [id number]   Date of Service  03/02/2024  HPI/Events of Note  Notified of hypotension as low as 51/39  HR 70s Oligoanuric as well On NS at 40/hr Sedated on Fentanyl  35 and propofol  35  Avoiding extremes in BP due to large cerebral infarct and enlarging aortic aneurysm  eICU Interventions  Propofol  placed on hold and BP now 99/58 Ordered a 500 cc LR bolus then 100 cc/hr maintenance Discussed with BSRN     Intervention Category Intermediate Interventions: Hypotension - evaluation and management  Damien ONEIDA Grout 03/02/2024, 12:56 AM

## 2024-03-02 NOTE — Progress Notes (Signed)
 Notified of ongoing hypotension despite IVF bolus with systolics in the 70's, goal SBP 100, started peripheral levophed . Repeat lactic acid is pending   Erin SAUNDERS Talley Casco, PA-C

## 2024-03-02 NOTE — Progress Notes (Signed)
" °  Echocardiogram 2D Echocardiogram has been performed.  Merlynn Argyle 03/02/2024, 12:42 PM "

## 2024-03-02 NOTE — Plan of Care (Signed)
   Problem: Metabolic: Goal: Ability to maintain appropriate glucose levels will improve Outcome: Progressing   Problem: Skin Integrity: Goal: Risk for impaired skin integrity will decrease Outcome: Progressing

## 2024-03-03 ENCOUNTER — Other Ambulatory Visit (HOSPITAL_COMMUNITY): Payer: Self-pay

## 2024-03-03 ENCOUNTER — Inpatient Hospital Stay (HOSPITAL_COMMUNITY)

## 2024-03-03 ENCOUNTER — Telehealth (HOSPITAL_COMMUNITY): Payer: Self-pay

## 2024-03-03 DIAGNOSIS — E43 Unspecified severe protein-calorie malnutrition: Secondary | ICD-10-CM | POA: Diagnosis not present

## 2024-03-03 DIAGNOSIS — E440 Moderate protein-calorie malnutrition: Secondary | ICD-10-CM | POA: Insufficient documentation

## 2024-03-03 DIAGNOSIS — E8729 Other acidosis: Secondary | ICD-10-CM | POA: Diagnosis not present

## 2024-03-03 DIAGNOSIS — R1310 Dysphagia, unspecified: Secondary | ICD-10-CM | POA: Diagnosis not present

## 2024-03-03 DIAGNOSIS — N179 Acute kidney failure, unspecified: Secondary | ICD-10-CM | POA: Diagnosis not present

## 2024-03-03 DIAGNOSIS — I7090 Unspecified atherosclerosis: Secondary | ICD-10-CM | POA: Diagnosis not present

## 2024-03-03 DIAGNOSIS — I639 Cerebral infarction, unspecified: Secondary | ICD-10-CM

## 2024-03-03 DIAGNOSIS — D72829 Elevated white blood cell count, unspecified: Secondary | ICD-10-CM | POA: Diagnosis not present

## 2024-03-03 DIAGNOSIS — I7123 Aneurysm of the descending thoracic aorta, without rupture: Secondary | ICD-10-CM | POA: Diagnosis not present

## 2024-03-03 DIAGNOSIS — N1832 Chronic kidney disease, stage 3b: Secondary | ICD-10-CM | POA: Diagnosis not present

## 2024-03-03 DIAGNOSIS — E1165 Type 2 diabetes mellitus with hyperglycemia: Secondary | ICD-10-CM | POA: Diagnosis not present

## 2024-03-03 DIAGNOSIS — R471 Dysarthria and anarthria: Secondary | ICD-10-CM | POA: Diagnosis not present

## 2024-03-03 LAB — GLUCOSE, CAPILLARY
Glucose-Capillary: 101 mg/dL — ABNORMAL HIGH (ref 70–99)
Glucose-Capillary: 116 mg/dL — ABNORMAL HIGH (ref 70–99)
Glucose-Capillary: 118 mg/dL — ABNORMAL HIGH (ref 70–99)
Glucose-Capillary: 123 mg/dL — ABNORMAL HIGH (ref 70–99)
Glucose-Capillary: 93 mg/dL (ref 70–99)
Glucose-Capillary: 97 mg/dL (ref 70–99)

## 2024-03-03 LAB — BASIC METABOLIC PANEL WITH GFR
Anion gap: 13 (ref 5–15)
BUN: 46 mg/dL — ABNORMAL HIGH (ref 8–23)
CO2: 22 mmol/L (ref 22–32)
Calcium: 9 mg/dL (ref 8.9–10.3)
Chloride: 110 mmol/L (ref 98–111)
Creatinine, Ser: 2.14 mg/dL — ABNORMAL HIGH (ref 0.44–1.00)
GFR, Estimated: 24 mL/min — ABNORMAL LOW
Glucose, Bld: 104 mg/dL — ABNORMAL HIGH (ref 70–99)
Potassium: 3.9 mmol/L (ref 3.5–5.1)
Sodium: 145 mmol/L (ref 135–145)

## 2024-03-03 LAB — CBC
HCT: 33 % — ABNORMAL LOW (ref 36.0–46.0)
Hemoglobin: 10.3 g/dL — ABNORMAL LOW (ref 12.0–15.0)
MCH: 28.5 pg (ref 26.0–34.0)
MCHC: 31.2 g/dL (ref 30.0–36.0)
MCV: 91.2 fL (ref 80.0–100.0)
Platelets: 183 K/uL (ref 150–400)
RBC: 3.62 MIL/uL — ABNORMAL LOW (ref 3.87–5.11)
RDW: 16.2 % — ABNORMAL HIGH (ref 11.5–15.5)
WBC: 17.2 K/uL — ABNORMAL HIGH (ref 4.0–10.5)
nRBC: 0 % (ref 0.0–0.2)

## 2024-03-03 LAB — CK: Total CK: 462 U/L — ABNORMAL HIGH (ref 38–234)

## 2024-03-03 LAB — LACTIC ACID, PLASMA: Lactic Acid, Venous: 1.2 mmol/L (ref 0.5–1.9)

## 2024-03-03 LAB — PHOSPHORUS: Phosphorus: 3 mg/dL (ref 2.5–4.6)

## 2024-03-03 MED ORDER — OSMOLITE 1.2 CAL PO LIQD
1000.0000 mL | ORAL | Status: DC
  Administered 2024-03-03 – 2024-03-14 (×7): 1000 mL
  Filled 2024-03-03 (×16): qty 1000

## 2024-03-03 MED ORDER — THIAMINE MONONITRATE 100 MG PO TABS
100.0000 mg | ORAL_TABLET | Freq: Every day | ORAL | Status: AC
  Administered 2024-03-03 – 2024-03-09 (×7): 100 mg
  Filled 2024-03-03 (×7): qty 1

## 2024-03-03 MED ORDER — ATORVASTATIN CALCIUM 80 MG PO TABS
80.0000 mg | ORAL_TABLET | Freq: Every day | ORAL | Status: DC
  Administered 2024-03-03 – 2024-03-17 (×15): 80 mg
  Filled 2024-03-03 (×15): qty 1

## 2024-03-03 MED ORDER — SODIUM CHLORIDE 0.9 % IV SOLN
2.0000 g | INTRAVENOUS | Status: DC
  Administered 2024-03-03 – 2024-03-04 (×2): 2 g via INTRAVENOUS
  Filled 2024-03-03 (×2): qty 20

## 2024-03-03 MED ORDER — POTASSIUM CHLORIDE 20 MEQ PO PACK
20.0000 meq | PACK | Freq: Once | ORAL | Status: AC
  Administered 2024-03-03: 20 meq
  Filled 2024-03-03: qty 1

## 2024-03-03 MED ORDER — OLANZAPINE 5 MG PO TABS
7.5000 mg | ORAL_TABLET | Freq: Every day | ORAL | Status: DC
  Administered 2024-03-03 – 2024-03-13 (×11): 7.5 mg
  Filled 2024-03-03 (×12): qty 1

## 2024-03-03 MED ORDER — FREE WATER
100.0000 mL | Status: DC
  Administered 2024-03-03 – 2024-03-05 (×11): 100 mL

## 2024-03-03 MED ORDER — OLANZAPINE 5 MG PO TBDP: 7.5000 mg | ORAL_TABLET | Freq: Every day | ORAL | Status: DC

## 2024-03-03 MED ORDER — APIXABAN 5 MG PO TABS
5.0000 mg | ORAL_TABLET | Freq: Two times a day (BID) | ORAL | Status: AC
  Administered 2024-03-03 – 2024-03-14 (×24): 5 mg
  Filled 2024-03-03 (×2): qty 2
  Filled 2024-03-03 (×2): qty 1
  Filled 2024-03-03: qty 2
  Filled 2024-03-03 (×3): qty 1
  Filled 2024-03-03: qty 2
  Filled 2024-03-03 (×4): qty 1
  Filled 2024-03-03 (×2): qty 2
  Filled 2024-03-03 (×3): qty 1
  Filled 2024-03-03: qty 2
  Filled 2024-03-03 (×2): qty 1
  Filled 2024-03-03: qty 2
  Filled 2024-03-03 (×2): qty 1

## 2024-03-03 NOTE — Evaluation (Signed)
 Occupational Therapy Evaluation Patient Details Name: Erin Riley MRN: [id number] DOB: 1950/10/17 Today's Date: 03/03/2024   History of Present Illness   Pt is a 73 y/o F admitted 03/01/24 after being found down by her son with left sided weakness, slurred speech and facial droop, also intermittent episodes of apnea. 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 She was intubated 12/27-28/25. PMH includes HTN, hypertrophic cardiomyopathy, PAD, CKD III, HLD, schizophrenia, AAA.     Clinical Impressions Pt is from an apt where she lives alone. Her son does grocery shopping for her, and assists her with MD appointments etc. She typically does her own light cooking and cleaning in addition to her ADL, and while she has a rollator she mostly furniture surfs around her apt. Today she is limited by cognition, visual deficits, L inattention, LUE flaccid. She is max A to min A for UB ADL and max A to total A for LB ADL. She is max A +2 for sit<>stand x2 from EOB today. L inattention - did not respond to nail bed pressure at all in LUE, did reach past midline with RUE with max A to interact with LUE. Very strong R gaze preference, did not come to midline throughout evaluation - even to see son. At this time recommending post-acute OT at the rehab of >3 hours daily level in addition to continued acute rehab to maximize safety and independence in ADL and functional transfers. VSS throughout session on RA - however 2L O2 donned as well as R mitt and bed alarm prior to therapy team leaving room. Next session continue to work on seated balance, engaging L side.      If plan is discharge home, recommend the following:   Two people to help with walking and/or transfers;Two people to help with bathing/dressing/bathroom;Assist for transportation;Supervision due to cognitive status     Functional Status Assessment    Patient has had a recent decline in their functional status and demonstrates the ability to make significant improvements in function in a reasonable and predictable amount of time.     Equipment Recommendations   Other (comment) (defer to next venue)     Recommendations for Other Services   Rehab consult;PT consult;Speech consult     Precautions/Restrictions   Precautions Precautions: Fall Recall of Precautions/Restrictions: Impaired Precaution/Restrictions Comments: L inattention, R gaze preference     Mobility Bed Mobility Overal bed mobility: Needs Assistance Bed Mobility: Supine to Sit, Sit to Supine     Supine to sit: Max assist, +2 for physical assistance, +2 for safety/equipment, HOB elevated Sit to supine: Total assist, +2 for safety/equipment, +2 for physical assistance (helicopter technique)   General bed mobility comments: max cues for sequencing and ultimately needing at least max A +2 for all aspects of bed mobility, especially with L side (attention)    Transfers Overall transfer level: Needs assistance Equipment used: 2 person hand held assist Transfers: Sit to/from Stand (x2) Sit to Stand: Max assist, +2 physical assistance, +2 safety/equipment (with use of bed pad to assist with scoop)           General transfer comment: heavy max A +2 with assist from bed pad to scoop Pt      Balance Overall balance assessment: Needs assistance Sitting-balance support: Single extremity supported, Feet supported Sitting balance-Leahy Scale: Poor Sitting balance - Comments: min A at least, max A at most, inconsistent throughout session Postural  control: Right lateral lean Standing balance support: Bilateral upper extremity supported, Single extremity supported (does not acknowledge LUE) Standing balance-Leahy Scale: Zero Standing balance comment: completely dependent on therapist assist                           ADL either performed or assessed  with clinical judgement   ADL Overall ADL's : Needs assistance/impaired Eating/Feeding: NPO   Grooming: Wash/dry face;Moderate assistance;Sitting Grooming Details (indicate cue type and reason): EOB, initially HOH to assist, used RUE Upper Body Bathing: Maximal assistance   Lower Body Bathing: Total assistance   Upper Body Dressing : Maximal assistance   Lower Body Dressing: Total assistance   Toilet Transfer: Maximal assistance;+2 for physical assistance;+2 for safety/equipment   Toileting- Clothing Manipulation and Hygiene: Total assistance;Bed level       Functional mobility during ADLs: Maximal assistance;+2 for physical assistance;+2 for safety/equipment;Cueing for safety;Cueing for sequencing (2 person HHA) General ADL Comments: overall max A for ADL due to cognition, L inattention, r gaze preference, deficits in balance and communication     Vision Ability to See in Adequate Light: 1 Impaired Vision Assessment?: Yes;Vision impaired- to be further tested in functional context Ocular Range of Motion: Impaired-to be further tested in functional context Alignment/Gaze Preference: Gaze right Tracking/Visual Pursuits: Requires cues, head turns, or add eye shifts to track;Impaired - to be further tested in functional context Additional Comments: cognition impacting Pt's ability to participate in visual assessment     Perception         Praxis         Pertinent Vitals/Pain Pain Assessment Pain Assessment: Faces Faces Pain Scale: No hurt Pain Intervention(s): Monitored during session, Repositioned     Extremity/Trunk Assessment Upper Extremity Assessment Upper Extremity Assessment: LUE deficits/detail LUE Deficits / Details: inattention; flaccid with no withdrawel or grimace to nail bed pain LUE Sensation: decreased light touch;decreased proprioception LUE Coordination: decreased fine motor;decreased gross motor   Lower Extremity Assessment Lower Extremity  Assessment: Defer to PT evaluation       Communication Communication Communication: Impaired Factors Affecting Communication: Difficulty expressing self;Reduced clarity of speech   Cognition Arousal: Lethargic Behavior During Therapy: Flat affect, Restless Cognition: Cognition impaired   Orientation impairments: Situation, Time Awareness: Intellectual awareness impaired, Online awareness impaired Memory impairment (select all impairments): Short-term memory, Working civil service fast streamer, Non-declarative long-term memory, Geneticist, Molecular long-term memory Attention impairment (select first level of impairment): Focused attention Executive functioning impairment (select all impairments): Initiation, Organization, Sequencing, Reasoning, Problem solving OT - Cognition Comments: dysarthric, goes in and out of sleep, restless and slightly impulsive, overall pleasant when alert enough                 Following commands: Impaired Following commands impaired: Follows one step commands with increased time, Follows one step commands inconsistently     Cueing  General Comments   Cueing Techniques: Verbal cues;Gestural cues;Tactile cues;Visual cues  Son present and supportive during session, Pt perfomed session on RA with SPO2 >91% throughout . BP within limits set by Neurology.   Exercises Exercises: Other exercises Other Exercises Other Exercises: PROM of LUE   Shoulder Instructions      Home Living Family/patient expects to be discharged to:: Private residence Living Arrangements: Alone Available Help at Discharge: Family (son lives 7 min away, but works) Type of Home: Apartment Home Access: Level entry     Home Layout: One level     Bathroom Shower/Tub: Tub/shower unit  Bathroom Toilet: Standard     Home Equipment: Rollator (4 wheels);BSC/3in1   Additional Comments: pt unable to provide all info and son was only here briefly then had to leave for work, carried over info from  4/20205      Prior Functioning/Environment Prior Level of Function : Needs assist             Mobility Comments: Holds onto furniture but intermittenty will use rollator ADLs Comments: reports Independent with ADLs, household iADls. Son assists with transportation and picking up groceries. pt reports using pill box for med mgmt though unsure of med compliance at home    OT Problem List: Decreased strength;Decreased range of motion;Decreased activity tolerance;Impaired balance (sitting and/or standing);Impaired vision/perception;Decreased coordination;Decreased cognition;Decreased safety awareness;Decreased knowledge of use of DME or AE;Decreased knowledge of precautions;Impaired sensation;Obesity;Impaired UE functional use;Increased edema   OT Treatment/Interventions: Self-care/ADL training;Therapeutic exercise;Neuromuscular education;DME and/or AE instruction;Manual therapy;Therapeutic activities;Cognitive remediation/compensation;Visual/perceptual remediation/compensation;Patient/family education;Balance training      OT Goals(Current goals can be found in the care plan section)   Acute Rehab OT Goals Patient Stated Goal: maximize independence OT Goal Formulation: With family Time For Goal Achievement: 03/17/24 Potential to Achieve Goals: Good ADL Goals Pt Will Perform Eating: with min assist;sitting Pt Will Perform Grooming: with supervision;sitting Pt Will Perform Upper Body Dressing: with mod assist;sitting Pt Will Perform Lower Body Dressing: with max assist;sit to/from stand Pt Will Transfer to Toilet: with max assist;squat pivot transfer;bedside commode Pt Will Perform Toileting - Clothing Manipulation and hygiene: with max assist;sitting/lateral leans Pt/caregiver will Perform Home Exercise Program: Left upper extremity;With Supervision;With written HEP provided   OT Frequency:  Min 2X/week    Co-evaluation PT/OT/SLP Co-Evaluation/Treatment: Yes Reason for  Co-Treatment: Necessary to address cognition/behavior during functional activity;For patient/therapist safety;To address functional/ADL transfers PT goals addressed during session: Mobility/safety with mobility;Balance;Strengthening/ROM OT goals addressed during session: ADL's and self-care;Strengthening/ROM      AM-PAC OT 6 Clicks Daily Activity     Outcome Measure Help from another person eating meals?: Total (NPO) Help from another person taking care of personal grooming?: A Lot Help from another person toileting, which includes using toliet, bedpan, or urinal?: Total Help from another person bathing (including washing, rinsing, drying)?: A Lot Help from another person to put on and taking off regular upper body clothing?: A Lot Help from another person to put on and taking off regular lower body clothing?: Total 6 Click Score: 9   End of Session Equipment Utilized During Treatment: Gait belt Nurse Communication: Mobility status;Precautions;Need for lift equipment  Activity Tolerance: Patient tolerated treatment well (impacted by lethargy) Patient left: in bed;with call bell/phone within reach;with bed alarm set (all 4 rails up)  OT Visit Diagnosis: Unsteadiness on feet (R26.81);Other abnormalities of gait and mobility (R26.89);Muscle weakness (generalized) (M62.81);Low vision, both eyes (H54.2);Other symptoms and signs involving the nervous system (R29.898);Other symptoms and signs involving cognitive function;Hemiplegia and hemiparesis Hemiplegia - Right/Left: Left Hemiplegia - dominant/non-dominant: Non-Dominant Hemiplegia - caused by: Cerebral infarction                Time: 8769-8745 OT Time Calculation (min): 24 min Charges:  OT General Charges $OT Visit: 1 Visit OT Evaluation $OT Eval Moderate Complexity: 1 Mod  Leita DEL OTR/L Acute Rehabilitation Services Office: [phone]  Leita JINNY Odea 03/03/2024, 2:19 PM

## 2024-03-03 NOTE — Plan of Care (Signed)
  Problem: SLP Dysphagia Goals Goal: Patient will demonstrate readiness for PO's Description: Patient will demonstrate readiness for PO's and/or instrumental swallow study as evidenced by: Outcome: Completed/Met

## 2024-03-03 NOTE — Telephone Encounter (Signed)
 Pharmacy Patient Advocate Encounter  Insurance verification completed.    The patient is insured through [facility]. Patient has Medicare and is not eligible for a copay card, but may be able to apply for patient assistance or Medicare RX Payment Plan (Patient Must reach out to their plan, if eligible for payment plan), if available.    Ran test claim for Eliquis  5mg  tablet and the current 30 day co-pay is $0.   This test claim was processed through [employer]- copay amounts may vary at other pharmacies due to [employer], or as the patient moves through the different stages of their insurance plan.

## 2024-03-03 NOTE — Progress Notes (Signed)
 [facility] [place] PROGRESS NOTE  ASSESSMENT / PLAN: Erin Riley is a 73 y.o. 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   She suffered a dense stroke the day prior to presentation.   Patient is not a candidate for open or endovascular repair of the aneurysm at this time. Any repair would require heparinization, which would put her at significant risk for hemorrhagic conversion and death.    She has a very high risk of annual rupture from this aneurysm. Rupture would likely result in her death.   Repair cannot be considered until her brain is recovered from stroke.  Typically we wait 6 months before considering repair in this situation.   Palliative care consultation may be helpful. Please call for any questions.  SUBJECTIVE: Extubated.  Aphasic speech.  Asking for water .  OBJECTIVE: BP 133/81   Pulse 87   Temp (!) 100.4 F (38 C)   Resp 19   Ht 5' 7 (1.702 m)   Wt 97.2 kg   LMP 05/10/2013   SpO2 (!) 88%   BMI 33.56 kg/m   Intake/Output Summary (Last 24 hours) at 03/03/2024 0959 Last data filed at 03/03/2024 0914 Gross per 24 hour  Intake 1379.97 ml  Output 1280 ml  Net 99.97 ml    No distress.  Extubated.  Breathing room air.  In restraints.     Latest Ref Rng & Units 03/03/2024    4:48 AM 03/02/2024    5:38 AM 03/02/2024    4:27 AM  CBC  WBC 4.0 - 10.5 K/uL 17.2   12.4   Hemoglobin 12.0 - 15.0 g/dL 89.6  9.9  88.0   Hematocrit 36.0 - 46.0 % 33.0  29.0  38.4   Platelets 150 - 400 K/uL 183   208         Latest Ref Rng & Units 03/03/2024    4:48 AM 03/02/2024    5:38 AM 03/02/2024    4:27 AM  CMP  Glucose 70 - 99 mg/dL 895   79   BUN 8 - 23 mg/dL 46   57   Creatinine 9.55 - 1.00 mg/dL 7.85   6.67   Sodium 864 - 145 mmol/L 145  139  139   Potassium 3.5 - 5.1 mmol/L 3.9  4.1  4.8   Chloride 98 - 111 mmol/L 110   103   CO2 22 - 32 mmol/L 22   19   Calcium  8.9 - 10.3  mg/dL 9.0   9.1     Estimated Creatinine Clearance: 28.4 mL/min (A) (by C-G formula based on SCr of 2.14 mg/dL (H)).  Debby SAILOR. Magda, MD [facility] Phone Number: [phone] 03/03/2024 9:59 AM

## 2024-03-03 NOTE — Procedures (Signed)
 Modified Barium Swallow Study  Patient Details  Name: Erin Riley MRN: [id number] Date of Birth: June 21, 1950  Today's Date: 03/03/2024  Modified Barium Swallow completed.  Full report located under Chart Review in the Imaging Section.  History of Present Illness Erin Riley is a 73 y.o. female who presented to the hospital from home on 03/01/24 after being found by her son with left sided weakness, slurred speech and facial droop. She was intubated upon arrival to ER. CXR did not show acute abnormality, CT chest did not show any focal consolidation or PE. 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 She was extubated on 03/02/24 at 1305 to oxygen via [place]. PMH: HTN, severe aortic stenosis, nonischemic cardiomyopathy, CKD stage IIIb, schizophrenia. MBSS completed on 12/29 with recommendation to continue NPO with the exception of puree snacks.   Clinical Impression  Pt presents with a moderate-severe oropharyngeal dysphagia per results of MBSS completed today. Recommend continue NPO with the exception of puree snacks from nursing unit including, applesauce, yogurt, or pudding. Ice chips are not recommended at this time due to oral deficits and aspiration risk.   Oral deficits characterized by poor oral and lingual strength and coordination, which resulted in anterior spillage, poor labial seal, prolonged oral transit, lingual pumping, and significant posterior spillage of liquids into the airway.   Pharyngeal deficits included reduced hyolaryngeal elevation/excursion, mistiming of epiglottic inversion, poor laryngeal vestibule closure (though may have been more of a timing issue), and reduced base of tongue retraction.   Findings:  -There was frank aspiration of thin liquids, nectar-thick liquids, and honey-thick liquids before/during the swallow. Aspiration occurrences were inconsistent, though pt did aspirate large volumes when aspiration  occurred.  -Aspiration appeared to occur as a result of significant oral deficits + lack of vallecular space given that liquid trials spilled nearly directly into the airway before/during the swallow.  -Pt exhibited an immediate or delayed cough response to majority of aspiration events with x1 event of silent aspiration of a small amount of honey-thick liquids.  -Presenting smaller or larger volumes of liquids did not appear to reduce aspiration risk.   Plan: SLP will follow for ongoing dysphagia and cognitive/communication therapy. Recommend focusing on oral motor exercises and oral bolus control in effort to reduce pt's aspiration risk with PO intake and possibly advance diet. Pt will need a repeat MBSS prior to diet advancement. SLP can try puree solids and honey-thick liquids by spoon at bedside as these appeared to be the safest consistencies.   Factors that may increase risk of adverse event in presence of aspiration Noe & Lianne 2021): Aspiration of thick, dense, and/or acidic materials;Frequent aspiration of large volumes;Dependence for feeding and/or oral hygiene;Frail or deconditioned  Swallow Evaluation Recommendations Recommendations: NPO (with exception of puree snacks from nursing floor) Liquid Administration via: Spoon Medication Administration: Crushed with puree Supervision: Full assist for feeding Swallowing strategies  : Slow rate;Small bites/sips;Minimize environmental distractions Postural changes: Stay upright 30-60 min after meals Oral care recommendations: Oral care QID (4x/day) Caregiver Recommendations: Avoid jello, ice cream, thin soups, popsicles;Remove water  pitcher;Have oral suction available      [name] 03/03/2024,12:39 PM

## 2024-03-03 NOTE — Progress Notes (Signed)
 Carotid duplex has been completed.   Results can be found under chart review under CV PROC. 03/03/2024 5:52 PM Ciarrah Rae RVT, RDMS

## 2024-03-03 NOTE — Progress Notes (Signed)
 Received patient from 2H, alert to self. Vitals stable. CHG bath completed, connected to tele and CCMD notified. Bed alarm on, call bell within reach. Plan of care continues.

## 2024-03-03 NOTE — Progress Notes (Signed)
 Inpatient Rehab Admissions Coordinator:   Per therapy recommendations, patient was screened for CIR candidacy by Megan Salon, MS, CCC-SLP.  At this time, Pt. is not at a level to tolerate the intensity of CIR. However,  Pt. may have potential to progress to becoming a potential CIR candidate, so CIR admissions team will follow and monitor for progress and participation with therapies and place consult order if Pt. appears to be an appropriate candidate. Please contact me with any questions.   Megan Salon, MS, CCC-SLP Rehab Admissions Coordinator  [phone] (celll) [phone] (office)

## 2024-03-03 NOTE — Evaluation (Signed)
 Physical Therapy Evaluation Patient Details Name: Erin Riley MRN: [id number] DOB: 1950-12-10 Today's Date: 03/03/2024  History of Present Illness  Pt is a 73 y/o F admitted 03/01/24 after being found down by her son with left sided weakness, slurred speech and facial droop, also intermittent episodes of apnea. 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 She was intubated 12/27-28/25. PMH includes HTN, hypertrophic cardiomyopathy, PAD, CKD III, HLD, schizophrenia, AAA.   Clinical Impression  Pt presents with condition above and deficits mentioned below, see PT Problem List. PTA, she was mod I holding onto furniture and intermittently using a rollator. She lives alone but her son lives very close by. Currently, the pt displays deficits in L attention as she typically keeps her head rotated to her R and cannot track to midline. She also displays deficits in L-sided strength (worse in UE than leg), balance, awareness, initiation, sequencing, memory, problem-solving, power, and activity tolerance. She is currently needing max-total assist x2 for all functional mobility. She has had a drastic functional decline and could benefit from intensive inpatient rehab, > 3 hours/day. Will continue to follow acutely.        If plan is discharge home, recommend the following: Two people to help with walking and/or transfers;Two people to help with bathing/dressing/bathroom;Assistance with cooking/housework;Assistance with feeding;Direct supervision/assist for medications management;Assist for transportation;Direct supervision/assist for financial management;Supervision due to cognitive status   Can travel by Doctor, Hospital (measurements PT);Wheelchair cushion (measurements PT);Hospital bed;Hoyer lift  Recommendations for Other Services  Rehab consult    Functional Status  Assessment Patient has had a recent decline in their functional status and demonstrates the ability to make significant improvements in function in a reasonable and predictable amount of time.     Precautions / Restrictions Precautions Precautions: Fall Recall of Precautions/Restrictions: Impaired Precaution/Restrictions Comments: Goal SBP < 140, L inattention, R gaze preference, cortrak, R mitt Restrictions Weight Bearing Restrictions Per Provider Order: No      Mobility  Bed Mobility Overal bed mobility: Needs Assistance Bed Mobility: Supine to Sit, Sit to Supine     Supine to sit: Max assist, +2 for physical assistance, +2 for safety/equipment, HOB elevated Sit to supine: Total assist, +2 for safety/equipment, +2 for physical assistance (helicopter technique)   General bed mobility comments: max cues for sequencing and ultimately needing at least max A +2 for all aspects of bed mobility, especially with L side (attention)    Transfers Overall transfer level: Needs assistance Equipment used: 2 person hand held assist Transfers: Sit to/from Stand, Bed to chair/wheelchair/BSC (x2) Sit to Stand: Max assist, +2 physical assistance, +2 safety/equipment (with use of bed pad to assist with scoop) Stand pivot transfers: Total assist, +2 physical assistance, +2 safety/equipment         General transfer comment: heavy max A +2 with assist from bed pad to scoop Pt up to stand then total assist to shift hips to her R towards HOB, x2 reps. L knee blocked each rep with noted little to no activation noted to use L leg    Ambulation/Gait               General Gait Details: unable at this time  Stairs            Wheelchair Mobility     Tilt Bed    Modified Rankin (Stroke Patients Only)  Balance Overall balance assessment: Needs assistance Sitting-balance support: Single extremity supported, Feet supported Sitting balance-Leahy Scale: Poor Sitting balance -  Comments: min A at least, max A at most, inconsistent throughout session Postural control: Right lateral lean Standing balance support: Bilateral upper extremity supported, Single extremity supported (does not acknowledge LUE) Standing balance-Leahy Scale: Zero Standing balance comment: completely dependent on therapist assist                             Pertinent Vitals/Pain Pain Assessment Pain Assessment: Faces Faces Pain Scale: No hurt Pain Intervention(s): Monitored during session    Home Living Family/patient expects to be discharged to:: Private residence Living Arrangements: Alone Available Help at Discharge: Family (son lives 7 min away, but works) Type of Home: Apartment Home Access: Level entry       Home Layout: One level Home Equipment: Rollator (4 wheels);BSC/3in1 Additional Comments: pt unable to provide all info and son was only here briefly then had to leave for work, carried over info from 4/20205    Prior Function Prior Level of Function : Needs assist             Mobility Comments: Holds onto furniture but intermittenty will use rollator ADLs Comments: reports Independent with ADLs, household iADls. Son assists with transportation and picking up groceries. pt reports using pill box for med mgmt though unsure of med compliance at home     Extremity/Trunk Assessment   Upper Extremity Assessment Upper Extremity Assessment: Defer to OT evaluation LUE Deficits / Details: inattention; flaccid with no withdrawel or grimace to nail bed pain LUE Sensation: decreased light touch;decreased proprioception LUE Coordination: decreased fine motor;decreased gross motor    Lower Extremity Assessment Lower Extremity Assessment: RLE deficits/detail;LLE deficits/detail RLE Deficits / Details: clonus noted LLE Deficits / Details: spontaneously externally/internally rotates hips, quads strength of 3- MMT, 2+ ankle dorsiflexion; withdraws to noxious light  stimuli; no clonus noted       Communication   Communication Communication: Impaired Factors Affecting Communication: Difficulty expressing self;Reduced clarity of speech    Cognition Arousal: Lethargic Behavior During Therapy: Flat affect, Restless   PT - Cognitive impairments: Awareness, Memory, Attention, Initiation, Sequencing, Problem solving, Safety/Judgement                       PT - Cognition Comments: Pt with strong R gaze and poor L attention. Does not track to midline. Poor awareness of her L side, placing it at risk for injury. Delayed processing and difficulty redirecting at times. poor recall of why she cannot have anything to drink yet Following commands: Impaired Following commands impaired: Follows one step commands with increased time, Follows one step commands inconsistently     Cueing Cueing Techniques: Verbal cues, Gestural cues, Tactile cues, Visual cues     General Comments General comments (skin integrity, edema, etc.): Son present and supportive during part of session (briefly before heading to work), Pt perfomed session on RA with SPO2 >91% throughout . SBP up to 130s    Exercises     Assessment/Plan    PT Assessment Patient needs continued PT services  PT Problem List Decreased strength;Decreased activity tolerance;Decreased balance;Decreased mobility;Decreased cognition;Decreased knowledge of use of DME;Decreased safety awareness;Decreased knowledge of precautions;Cardiopulmonary status limiting activity;Impaired sensation       PT Treatment Interventions DME instruction;Gait training;Functional mobility training;Therapeutic activities;Therapeutic exercise;Balance training;Neuromuscular re-education;Cognitive remediation;Patient/family education;Wheelchair mobility training    PT Goals (Current  goals can be found in the Care Plan section)  Acute Rehab PT Goals Patient Stated Goal: pt reported desire to drink water ; son expressed desire for  pt to improve PT Goal Formulation: With patient/family Time For Goal Achievement: 03/17/24 Potential to Achieve Goals: Good    Frequency Min 3X/week     Co-evaluation   Reason for Co-Treatment: Necessary to address cognition/behavior during functional activity;For patient/therapist safety;To address functional/ADL transfers PT goals addressed during session: Mobility/safety with mobility;Balance;Strengthening/ROM OT goals addressed during session: ADL's and self-care;Strengthening/ROM       AM-PAC PT 6 Clicks Mobility  Outcome Measure Help needed turning from your back to your side while in a flat bed without using bedrails?: Total Help needed moving from lying on your back to sitting on the side of a flat bed without using bedrails?: Total Help needed moving to and from a bed to a chair (including a wheelchair)?: Total Help needed standing up from a chair using your arms (e.g., wheelchair or bedside chair)?: Total Help needed to walk in hospital room?: Total Help needed climbing 3-5 steps with a railing? : Total 6 Click Score: 6    End of Session   Activity Tolerance: Patient tolerated treatment well Patient left: in bed;with call bell/phone within reach;with bed alarm set Nurse Communication: Mobility status;Need for lift equipment PT Visit Diagnosis: Unsteadiness on feet (R26.81);Other abnormalities of gait and mobility (R26.89);Muscle weakness (generalized) (M62.81);Difficulty in walking, not elsewhere classified (R26.2);Other symptoms and signs involving the nervous system (R29.898);Hemiplegia and hemiparesis Hemiplegia - Right/Left: Left Hemiplegia - caused by: Cerebral infarction    Time: 1229-1251 PT Time Calculation (min) (ACUTE ONLY): 22 min   Charges:   PT Evaluation $PT Eval Moderate Complexity: 1 Mod   PT General Charges $$ ACUTE PT VISIT: 1 Visit         Theo Ferretti, PT, DPT Acute Rehabilitation Services  Office: [phone]   Theo CHRISTELLA Ferretti 03/03/2024, 2:51 PM

## 2024-03-03 NOTE — Progress Notes (Signed)
 "  NAME:  Erin Riley, MRN:  [id number], DOB:  1950-08-31, LOS: 2 ADMISSION DATE:  03/01/2024, CONSULTATION DATE: 12/27 REFERRING MD: Patt, CHIEF COMPLAINT: Stroke  History of Present Illness:  73 year old female patient with known history of hypertension, severe aortic stenosis, nonischemic cardiomyopathy, stage IIIb CKD was at her last known normal state of health on 12/25.  Found by son at home today on 12/27 with left sided weakness, slurred speech and facial droop, also intermittent episodes of apnea.  Initial glucose 159. She was intubated upon arrival to the emergency room  ER evaluation: Underwent trauma evaluation given altered mental status including multiple diagnostic imaging.  Portable chest x-ray without acute abnormality, CT pelvis negative film of left arm and hand negative left wrist negative, CT angiogram of chest showed interval enlargement of a descending thoracic aortic aneurysm measured at 7.3 x 5 cm, this was bigger than previously recorded at 5.5 x 4.1 cm it also demonstrated suspected penetrating arthrosclerotic ulcer versus saccular aneurysm.  There was also interval enlargement of a suprarenal and infrarenal abdominal aortic aneurysm and a stable ascending thoracic aortic aneurysm.  Neurology was called in regards to the new neurological deficit, because of uncertain timeframe she was felt not a candidate for any neuroendovascular or emergent IV thrombolysis intervention. Initial blood pressure was in the 150s over 90s to 100s.  She was started on antihypertensives by emergency room. Preliminary CT of head shows large right sided temporoparietal infarct  Pertinent  Medical History  Hypertension, CKD stage IIIb, chronic back pain, coronary artery disease, diverticulitis, nonischemic dilated cardiomyopathy with NYHA class II symptoms.  Has severe LVH with history of LV outflow gradient and diastolic dysfunction.  History of mild aortic stenosis, peripheral arterial disease,  schizophrenia, obesity   Current laboratory data white blood cell count 12, total CK9 198, glucose 148 BUN 49 creatinine 2.5 up from 1.6 range, lactic acid 2.8 troponin 81 CT of head   Last cardiology November 2025, given history of schizophrenia thought not a candidate for TAVR Significant Hospital Events: Including procedures, antibiotic start and stop dates in addition to other pertinent events   73 year old female with admitted with new left-sided hemiparesis, dysphagia, and facial droop initially brought in as trauma code, trauma imaging negative however CT chest showing enlarging descending thoracic aortic aneurysm with possible penetrating arterial sclerotic ulcer versus saccular aneurysm, increasing suprarenal and infrarenal aortic aneurysm and stable ascending thoracic aortic aneurysm.  CT head showed large right hemisphere stroke 12/29 txf out of ICU.  Cortrak   Interim History / Subjective:   Asking for water  Off pressors   Objective    Blood pressure (!) 134/94, pulse (!) 102, temperature (!) 100.4 F (38 C), resp. rate (!) 29, height 5' 7 (1.702 m), weight 97.2 kg, last menstrual period 05/10/2013, SpO2 92%.        Intake/Output Summary (Last 24 hours) at 03/03/2024 1012 Last data filed at 03/03/2024 9085 Gross per 24 hour  Intake 1274.09 ml  Output 1205 ml  Net 69.09 ml   Filed Weights   03/01/24 1945 03/02/24 0457 03/03/24 0500  Weight: 96 kg 97.3 kg 97.2 kg    Examination: General: chronically and acutely ill older adult F  Neuro: dysarthria L facial droop L sided weakness  HENT:  NCAT pink mm edentulous  Lungs:  symmetrical chest expansion, scattered upper rhonchi  Cardiovascular: rr s1s2 cap refill < 3 sec  Abdomen: soft round  Extremities: no acute joint deformity  Skin: scattered ecchymosis LUE  LLE    Resolved problem list  Lactic acidosis   Assessment and Plan   Subacute R MCA CVA  Dysphagia Dysarthria  L sided deficits  Hx schizophrenia,  dementia  -Presented outside of window for intervention. Etiology felt most likely cardioembolic  - Appreciate stroke team's management.  Agree that stroke was likely cardioembolic, although no LV thrombus seen on echo.   P -SBP goal is complex:  enlarging aortic aneurysms, hx AS -- need to avoid significant HTN with rupture risk. Goal 100-140  -cortrak -when enteral access est, to start eliquis  5 BID and atorvastatin  80 (CK from her down time OOH is coming down nicely, think fine to start statin)  -PT/OT/SLP  -home zyprexa  ordered as ODT-- change to per tube when cortrak placed  -EN per RDN   Enlarging thoracoabdominal aortic aneurysm  -not a candidate for open or endovascular repair at this time. See [facility]VS note 12/28 for details  Hx NICM  HFrEF -- LVEF 25-30%  Severe AS  -not a TAVR candidate  -GDMT limited at this time w renal dysfxn   AKI on CKD3b, improving  Elevated CK, improving  -follow renal indices uop -dc foley  -as above, statin to start 12/29 when enteral access established. Will repeat CT in AM, from there would consider following every couple of days  DM2 w hyperglycemia  -SSI  Leukocytosis  + low grade temp -some upper lobe rhonchi, plausible aspirations  -MRSA PCR neg -has a foley P -start rocephin  12/29 - suspect pulm source most likely  -Bcx  -defer urine; foley to be discontinued 12/29 & rocephin  would be good uti coverage    Dispo: txf out of ICU 12/29   Labs   CBC: Recent Labs  Lab 03/01/24 1517 03/01/24 1531 03/01/24 1637 03/01/24 2039 03/02/24 0427 03/02/24 0538 03/03/24 0448  WBC 12.0*  --   --  10.8* 12.4*  --  17.2*  NEUTROABS 9.7*  --   --   --   --   --   --   HGB 12.5   < > 11.6* 11.5* 11.9* 9.9* 10.3*  HCT 39.9   < > 34.0* 36.2 38.4 29.0* 33.0*  MCV 89.5  --   --  90.3 91.4  --  91.2  PLT 254  --   --  222 208  --  183   < > = values in this interval not displayed.    Basic Metabolic Panel: Recent Labs  Lab 03/01/24 1517  03/01/24 1531 03/01/24 1637 03/01/24 2039 03/02/24 0427 03/02/24 0538 03/03/24 0448  NA 142 144  144 141  --  139 139 145  K 3.8 3.8  3.8 3.5  --  4.8 4.1 3.9  CL 102 106  --   --  103  --  110  CO2 23  --   --   --  19*  --  22  GLUCOSE 148* 140*  --   --  79  --  104*  BUN 49* 50*  --   --  57*  --  46*  CREATININE 2.51* 2.70*  --  2.87* 3.32*  --  2.14*  CALCIUM  10.1  --   --   --  9.1  --  9.0  MG  --   --   --   --  2.5*  --   --    GFR: Estimated Creatinine Clearance: 28.4 mL/min (A) (by C-G formula based on SCr of 2.14 mg/dL (H)). Recent Labs  Lab 03/01/24 1517 03/01/24 1531 03/01/24 2039 03/02/24 0427 03/02/24 1302 03/03/24 0448  WBC 12.0*  --  10.8* 12.4*  --  17.2*  LATICACIDVEN  --  2.8*  --   --  2.3* 1.2     High mdm  Ronnald Gave MSN, AGACNP-BC [facility] Pulmonary/Critical Care Medicine Amion for pager  03/03/2024, 10:12 AM      "

## 2024-03-03 NOTE — Progress Notes (Addendum)
 Initial Nutrition Assessment  DOCUMENTATION CODES:   Non-severe (moderate) malnutrition in context of chronic illness  INTERVENTION:   Tube Feeding via Cortrak:  Osmolite 1.2 at 60 ml/hr Begin TF at rate of 20 ml/hr, titrate by 10 mL q 8 hours until goal rate of 60 ml/hr TF at goal provides 1728 kcals, 80 g of protein and 1166 mL of free water   Add Free Water  Flush of 100 mL q 4 hours to meet hydration needs. Provides additional 600 mL in 24 hours  Pt with hx of DM, currently on sliding scale, CBGs acceptable range. Pt may require additional insulin  coverage with initiation of TF.   Check phosphorus as add-on to AM labs; recommend monitoring potassium, magnesium , phosphorus for at least 3 days as possible refeeding risk  Add Thiamine  100 mg daily x 7 days  NUTRITION DIAGNOSIS:   Moderate Malnutrition related to chronic illness as evidenced by mild fat depletion, mild muscle depletion.  GOAL:   Patient will meet greater than or equal to 90% of their needs  MONITOR:   Diet advancement, TF tolerance, Labs, Weight trends, I & O's  REASON FOR ASSESSMENT:   Consult Enteral/tube feeding initiation and management  ASSESSMENT:   73 yo female admitted with subacute R MCA CVA with dysphagia, dysarthria and L sided deficits, +enlarging thoracoabdominal aortic aneurysm, AKI on CKD. PMH includes HTN, severe aortic stenosis, nonischemic CM, schizophrenia, dementia, CKD 3, DM  12/27 Admitted. Large MCA infarct with L sided deficits, dysphagia, dysarthria 12/29 Cortrak placed, TF initiated  Pt arousable with stimulation but mostly sleeps through exam on visit today. Unable to obtain diet and weight history from patient at this time.   NPO, Cortrak placed today. MBS performed this AM, SLP recommends  Current wt 97.2 kg; wt yesterday 87.3 kg. Admission wt: 96 kg.   Abdomen soft, obese. BS present, no BM yet  Labs: Sodium 145 (wdl) BUN 46 Creatinine 2.14 Potassium 3.9 Phosphorus  not checked CBGs 78-118  Meds: Colace SS novolog  Miralax   KCL  NUTRITION - FOCUSED PHYSICAL EXAM:  Flowsheet Row Most Recent Value  Orbital Region Mild depletion  Upper Arm Region Mild depletion  Thoracic and Lumbar Region Mild depletion  Buccal Region Mild depletion  Temple Region Mild depletion  Clavicle Bone Region Mild depletion  Clavicle and Acromion Bone Region Mild depletion  Scapular Bone Region Mild depletion  Dorsal Hand Unable to assess  Patellar Region Mild depletion  Anterior Thigh Region Mild depletion  Posterior Calf Region Mild depletion  Edema (RD Assessment) Mild  Hair Reviewed  Eyes Unable to assess  Mouth --  [edentulous]  Skin Reviewed  Nails Reviewed    Diet Order:   Diet Order             Diet NPO time specified Except for: Other (See Comments)  Diet effective now                   EDUCATION NEEDS:   Not appropriate for education at this time  Skin:  Skin Assessment: Reviewed RN Assessment  Last BM:  PTA  Height:   Ht Readings from Last 1 Encounters:  03/01/24 5' 7 (1.702 m)    Weight:   Wt Readings from Last 1 Encounters:  03/03/24 97.2 kg     BMI:  Body mass index is 33.56 kg/m.  Estimated Nutritional Needs:   Kcal:  1650-1800  Protein:  70-80 g  Fluid:  >/= 1.7L   Betsey Finger MS, RDN, LDN,  CNSC Registered Dietitian 3 Clinical Nutrition RD Inpatient Contact Info in Amion

## 2024-03-03 NOTE — Procedures (Signed)
 Cortrak  Person Inserting Tube:  Merilee, Amy Belloso L, RD Tube Type:  Cortrak - 43 inches Tube Size:  10 Tube Location:  Left nare Secured by: Bridle Initial Placement:  Gastric Technique Used to Measure Tube Placement:  Marking at nare/corner of mouth Cortrak Secured At:  65 cm Initial Placement Verification:  Cortrak device (Registered Dieticians Only)  Cortrak Tube Team Note:  Consult received to place a Cortrak feeding tube.   No x-ray is required. RN may begin using tube.   If the tube becomes dislodged please keep the tube and contact the Cortrak team at [email or web address] for replacement.  If after hours and replacement cannot be delayed, place a NG tube and confirm placement with an abdominal x-ray.    Nestora Merilee RD, LDN Registered Dietitian I Please see AMION for contact information

## 2024-03-04 DIAGNOSIS — R471 Dysarthria and anarthria: Secondary | ICD-10-CM

## 2024-03-04 DIAGNOSIS — R1310 Dysphagia, unspecified: Secondary | ICD-10-CM | POA: Diagnosis not present

## 2024-03-04 DIAGNOSIS — I639 Cerebral infarction, unspecified: Secondary | ICD-10-CM | POA: Diagnosis not present

## 2024-03-04 LAB — GLUCOSE, CAPILLARY
Glucose-Capillary: 127 mg/dL — ABNORMAL HIGH (ref 70–99)
Glucose-Capillary: 137 mg/dL — ABNORMAL HIGH (ref 70–99)
Glucose-Capillary: 139 mg/dL — ABNORMAL HIGH (ref 70–99)
Glucose-Capillary: 145 mg/dL — ABNORMAL HIGH (ref 70–99)
Glucose-Capillary: 147 mg/dL — ABNORMAL HIGH (ref 70–99)
Glucose-Capillary: 160 mg/dL — ABNORMAL HIGH (ref 70–99)

## 2024-03-04 LAB — BASIC METABOLIC PANEL WITH GFR
Anion gap: 10 (ref 5–15)
BUN: 39 mg/dL — ABNORMAL HIGH (ref 8–23)
CO2: 26 mmol/L (ref 22–32)
Calcium: 9.4 mg/dL (ref 8.9–10.3)
Chloride: 113 mmol/L — ABNORMAL HIGH (ref 98–111)
Creatinine, Ser: 1.72 mg/dL — ABNORMAL HIGH (ref 0.44–1.00)
GFR, Estimated: 31 mL/min — ABNORMAL LOW
Glucose, Bld: 135 mg/dL — ABNORMAL HIGH (ref 70–99)
Potassium: 3.9 mmol/L (ref 3.5–5.1)
Sodium: 148 mmol/L — ABNORMAL HIGH (ref 135–145)

## 2024-03-04 LAB — CBC
HCT: 33.4 % — ABNORMAL LOW (ref 36.0–46.0)
Hemoglobin: 10.3 g/dL — ABNORMAL LOW (ref 12.0–15.0)
MCH: 28.5 pg (ref 26.0–34.0)
MCHC: 30.8 g/dL (ref 30.0–36.0)
MCV: 92.3 fL (ref 80.0–100.0)
Platelets: 189 K/uL (ref 150–400)
RBC: 3.62 MIL/uL — ABNORMAL LOW (ref 3.87–5.11)
RDW: 16.3 % — ABNORMAL HIGH (ref 11.5–15.5)
WBC: 14.1 K/uL — ABNORMAL HIGH (ref 4.0–10.5)
nRBC: 0 % (ref 0.0–0.2)

## 2024-03-04 LAB — MAGNESIUM: Magnesium: 2.8 mg/dL — ABNORMAL HIGH (ref 1.7–2.4)

## 2024-03-04 LAB — PHOSPHORUS: Phosphorus: 2.6 mg/dL (ref 2.5–4.6)

## 2024-03-04 LAB — CK: Total CK: 246 U/L — ABNORMAL HIGH (ref 38–234)

## 2024-03-04 NOTE — Progress Notes (Signed)

## 2024-03-04 NOTE — Hospital Course (Addendum)
 Erin Riley is a 73 yo female with PMH CAD, CKD, HLD, HTN, PAD, schizophrenia, severe AS who was found on the floor at home.  She had dense left-sided weakness, slurred speech, facial droop.  She required intubation upon arrival to the ER.  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  MRI brain showed acute right MCA territory infarct with 2 mm leftward midline shift.  She was able to be weaned off pressors and extubated.  Also evaluated by vascular surgery.  Subsequently she was not able to take any oral intake and Cortrak was placed.  Given her inability to advance her diet initially PEG tube was recommended with tentative plan of placement on 03/17/2024 per the IR team. MBS done and now on D1 Honey Thick Liquid Diet and did extremely well with her calorie count and oral intake so PEG has been discontinued and Cortrak is being removed.   PT/OT recommending CIR but may not be a candidate and may need to pursue SNF and other venues.  Assessment and Plan:   Subacute R MCA CVA  Dysphagia as a result of CVA, improved  -Presented outside of window for intervention. Etiology felt most likely cardioembolic  -Appreciate stroke team's management.  Agree that stroke was likely cardioembolic, although no LV thrombus seen on echo -SBP goal is complex:  enlarging aortic aneurysms, hx AS -- need to avoid significant HTN with rupture risk. Goal SBP 100-140  -Now on ASA 81 mg po Daily and Apixaban  5 mg po BID -Has significant dense left hemiplegia with neglect; CIR declined and now PT/OT recommending SNF, will need SNF pursuit and TOC involved and Faxed out SNF Referrals -Had poor intake and Cortrak Placement with TF with plans to transition to PEG given mentation and status; IR was  requesting repeat SLP eval prior to PEG.  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  -Not a candidate for open or endovascular repair at this time. See VVS note 12/28 for details   Hx NICM  HFrEF -- LVEF 25-30%  Severe AS -Not a TAVR candidate  -GDMT limited at this time w renal dysfxn; C/w ASA 81 mg po Daily, Atorvastatin  80 mg Daily, and Carvedilol  12.5 mg BID    AKI on CKD3b, improving  Elevated CK, improving  -D/C'd Foley;  Renal function improved and back to baseline. BUN/Cr Trend fairly stable and is now 35/1.04 on the last check. No longer getting IVF and TF stopped. Avoid Nephrotoxic Medications, Contrast Dyes, Hypotension and Dehydration to Ensure Adequate Renal Perfusion and will need to Renally Adjust Meds. CTM & Trend Renal Function carefully & repeat CMP in the AM   Abnormal LFTs: Resolved. AST and ALT Trend mildly  elevated and now normalized. AST went from 34 -> 71 -> 60 -> 53 -> 43 -> 39 x2. ALT went from 16 -> 81 -> 69 -> 56 -> 46 -> 38 -> 35. Likely reactive. CTM and Trend intermittently   Hypernatremia: Improved. Na+ is now 140. CTM and Trend and repeat CMP in the AM    DM2 w Hyperglycemia: C/w Moderate Novolog  SSI q4h. CTM CBGs per Protocol. CBG Trend ranging from 91-134 on the last 7 checks  Leukocytosis + Low grade  temp: Resolved. WBC is 7.5. Some upper lobe rhonchi, plausible aspirations. MRSA PCR neg. Had a foley but removed. Augmentin  course completed. CTM for S/Sx of Infection. Repeat CBC in the AM  Hypercalcemia: Mild. CO2 is now 10.4 but corrected for Albumin  it is 11.0. Start IVF w/ NS @ 75 mL/hr x16 hours. CTM and Trend and repeat CMP in the AM   Normocytic Anemia: Hgb/Hct Trend fairly stable with last check being 10.2/32.3. Checked Anemia Panel- Fe level of 27, UIBC of 230, TIBC 58, saturation of 10%, ferritin of 344, folate level 8.7 and vitamin B12 1503.  CTM for S/Sx of Bleeding; No overt bleeding noted. Repeat CBC in the AM   Thrombocytosis: Mild and in the setting of above. Plt Count has trended up to 581. CTM and Trend and repeat CBC In the AM  Non-Severe (Moderate) Malnutrition in the context of chronic illness: Nutrition Status: Nutrition Problem: Moderate Malnutrition Etiology: chronic illness Signs/Symptoms: mild fat depletion, mild muscle depletion Interventions: Refer to RD note for recommendations -TF via Cortrak Discontinued given   Hypoalbuminemia: Albumin  Level is now 3.2 on the last check. CTM & Trend & repeat CMP in the AM  Class I Obesity: Complicates overall prognosis and care. Estimated body mass index is 33.18 kg/m as calculated from the following:   Height as of this encounter: 5' 7 (1.702 m).   Weight as of this encounter: 96.1 kg. Weight Loss and Dietary Counseling given
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
## 2024-03-04 NOTE — Progress Notes (Addendum)
 Physical Therapy Treatment Patient Details Name: Erin Riley MRN: [id number] DOB: 1950-06-05 Today's Date: 03/04/2024   History of Present Illness Pt is a 73 y/o F admitted 03/01/24 after being found down by her son with left sided weakness, slurred speech and facial droop, also intermittent episodes of apnea. 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 She was intubated 12/27-28/25. PMH includes HTN, hypertrophic cardiomyopathy, PAD, CKD III, HLD, schizophrenia, AAA.    PT Comments  Pt supine in bed on arrival, notably restless, constantly moving RUE/LE, however pt with eyes closed. Pt able to be roused and agreeable to session with nodding head yes when asked if she would like to sit up EOB. Pt requiring total A to initiate RLE to EOB however pt quickly pulling RLE back into bed and resistant to more attempts with initiating sequencing with RUE/RLE. Pt requiring total A to reposition to [facility] with pt unable to follow commands to assist with RUE. Pt positioned to midline with head neutral and LUE elevated on pillows. Pt continues to benefit from skilled PT services to progress toward functional mobility goals.     If plan is discharge home, recommend the following: Two people to help with walking and/or transfers;Two people to help with bathing/dressing/bathroom;Assistance with cooking/housework;Assistance with feeding;Direct supervision/assist for medications management;Assist for transportation;Direct supervision/assist for financial management;Supervision due to cognitive status   Can travel by Doctor, Hospital (measurements PT);Wheelchair cushion (measurements PT);Hospital bed;Hoyer lift    Recommendations for Other Services       Precautions / Restrictions Precautions Precautions: Fall Recall of Precautions/Restrictions:  Impaired Precaution/Restrictions Comments: Goal SBP < 140, L inattention, R gaze preference, cortrak, R mitt Restrictions Weight Bearing Restrictions Per Provider Order: No     Mobility  Bed Mobility Overal bed mobility: Needs Assistance Bed Mobility: Supine to Sit     Supine to sit: Total assist, HOB elevated     General bed mobility comments: total A to attempt to initiate bed mobility to with RLE however pt resisting and bring RLE back into bed and very restless with RUE, total A to reposition at [facility] and midline with HOB elevated to 40degrees    Transfers                        Ambulation/Gait               General Gait Details: unable at this time   Stairs             Wheelchair Mobility     Tilt Bed    Modified Rankin (Stroke Patients Only)       Balance Overall balance assessment: Needs assistance                                          Communication Communication Communication: Impaired Factors Affecting Communication: Difficulty expressing self;Reduced clarity of speech  Cognition Arousal: Lethargic Behavior During Therapy: Restless, Agitated                           PT - Cognition Comments: pt restless throughout session however keeping eyes closed, very limited verbalizations, nodding yes to coming to sit EOB but then resisting and pulling away  with assist Following commands: Impaired Following commands impaired: Follows one step commands inconsistently    Cueing Cueing Techniques: Verbal cues, Gestural cues, Tactile cues, Visual cues  Exercises Other Exercises Other Exercises: PROM of LUE Other Exercises: pt unable to follow commands this session for other exercises.    General Comments General comments (skin integrity, edema, etc.): VSS on RA      Pertinent Vitals/Pain Pain Assessment Pain Assessment: Faces Faces Pain Scale: Hurts a little bit Pain Location: generalized with  mobility Pain Descriptors / Indicators: Grimacing Pain Intervention(s): Monitored during session, Limited activity within patient's tolerance, Repositioned    Home Living                          Prior Function            PT Goals (current goals can now be found in the care plan section) Acute Rehab PT Goals Patient Stated Goal: none stated this session PT Goal Formulation: With patient/family Time For Goal Achievement: 03/17/24 Progress towards PT goals: Not progressing toward goals - comment    Frequency    Min 3X/week      PT Plan      Co-evaluation              AM-PAC PT 6 Clicks Mobility   Outcome Measure  Help needed turning from your back to your side while in a flat bed without using bedrails?: Total Help needed moving from lying on your back to sitting on the side of a flat bed without using bedrails?: Total Help needed moving to and from a bed to a chair (including a wheelchair)?: Total Help needed standing up from a chair using your arms (e.g., wheelchair or bedside chair)?: Total Help needed to walk in hospital room?: Total Help needed climbing 3-5 steps with a railing? : Total 6 Click Score: 6    End of Session   Activity Tolerance: Patient tolerated treatment well Patient left: in bed;with call bell/phone within reach;with bed alarm set;Other (comment) (with R mitt in place) Nurse Communication: Mobility status;Need for lift equipment PT Visit Diagnosis: Unsteadiness on feet (R26.81);Other abnormalities of gait and mobility (R26.89);Muscle weakness (generalized) (M62.81);Difficulty in walking, not elsewhere classified (R26.2);Other symptoms and signs involving the nervous system (R29.898);Hemiplegia and hemiparesis Hemiplegia - Right/Left: Left Hemiplegia - caused by: Cerebral infarction     Time: 9096-9081 PT Time Calculation (min) (ACUTE ONLY): 15 min  Charges:    $Therapeutic Activity: 8-22 mins PT General Charges $$ ACUTE  PT VISIT: 1 Visit                     Tyshika Baldridge R. PTA Acute Rehabilitation Services Office: [phone]   Therisa CHRISTELLA Boor 03/04/2024, 12:17 PM

## 2024-03-04 NOTE — Progress Notes (Signed)
 " Progress Note    [name]   FMW:[id number]  DOB: [birth year]  DOA: 03/01/2024     3 PCP: [name], MD  Initial CC: Found down at home  Hospital Course: Erin Riley is a 73 yo female with PMH CAD, CKD, HLD, HTN, PAD, schizophrenia, severe AS who was found on the floor at home.  She had dense left-sided weakness, slurred speech, facial droop.  She required intubation upon arrival to the ER.  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  MRI brain showed acute right MCA territory infarct with 2 mm leftward midline shift.  She was able to be weaned off pressors and extubated.  Also evaluated by vascular surgery.    Assessment and Plan    Subacute R MCA CVA  -Presented outside of window for intervention. Etiology felt most likely cardioembolic  - Appreciate stroke team's management.  Agree that stroke was likely cardioembolic, although no LV thrombus seen on echo -SBP goal is complex:  enlarging aortic aneurysms, hx AS -- need to avoid significant HTN with rupture risk. 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  -not a candidate for open or endovascular repair at this time. See VVS note 12/28 for  details   Hx NICM  HFrEF -- LVEF 25-30%  Severe AS  -not a TAVR candidate  -GDMT limited at this time w renal dysfxn    AKI on CKD3b, improving  Elevated CK, improving  -follow renal indices uop -dc foley    DM2 w hyperglycemia  -SSI   Leukocytosis  + low grade temp -some upper lobe rhonchi, plausible aspirations  -MRSA PCR neg -has a foley -start rocephin  12/29 - suspect pulm source most likely  -Bcx  -defer urine; foley to be discontinued 12/29 & rocephin  would be good uti coverage   Interval History:  Resting comfortably when seen this morning.  No events overnight.  Essentially awaiting either CIR placement or SNF at this time.  Does have cortrak in place however  Antimicrobials:   DVT prophylaxis:  SCDs Start: 03/01/24 1745 apixaban  (ELIQUIS ) tablet 5 mg   Code Status:   Code Status: Full Code  Mobility Assessment (Last 72 Hours)     Mobility Assessment     Row Name 03/04/24 1200 03/04/24 0800 03/04/24 0422 03/04/24 0002 03/03/24 2004   Does the patient have exclusion criteria? -- No- Perform mobility assessment No- Perform mobility assessment No- Perform mobility assessment No- Perform mobility assessment   What is the highest level of mobility based on the mobility assessment? Level 1 (Bedfast) - Unable to balance while sitting on edge of bed Level 1 (Bedfast) - Unable to balance while sitting on edge of bed Level 1 (Bedfast) - Unable to balance while sitting on edge of bed Level 1 (Bedfast) - Unable to balance while sitting on edge of bed Level  1 (Bedfast) - Unable to balance while sitting on edge of bed   Is the above level different from baseline mobility prior to current illness? -- -- Yes - Recommend PT order Yes - Recommend PT order Yes - Recommend PT order    Row Name 03/03/24 1400 03/03/24 1200 03/03/24 1117 03/03/24 0800 03/02/24 2000   Does the patient have exclusion criteria? -- -- No- Perform mobility assessment No- Perform mobility assessment Yes-  Bedfast (Level 1) - Select exclusion criteria in next row   Mobility Assessment Exclusion Criteria -- -- No exclusion criteria present, perform mobility assessment No exclusion criteria present, perform mobility assessment No exclusion criteria present, perform mobility assessment   What is the highest level of mobility based on the mobility assessment? Level 1 (Bedfast) - Unable to balance while sitting on edge of bed Level 1 (Bedfast) - Unable to balance while sitting on edge of bed Level 1 (Bedfast) - Unable to balance while sitting on edge of bed Level 1 (Bedfast) - Unable to balance while sitting on edge of bed Level 1 (Bedfast) - Unable to balance while sitting on edge of bed   Is the above level different from baseline mobility prior to current illness? -- -- Yes - Recommend PT order Yes - Recommend PT order Yes - Recommend PT order    Row Name 03/02/24 0800 03/01/24 2000         Does the patient have exclusion criteria? Yes- Hold (Level 0) - Assessment complete Yes- Hold (Level 0) - Assessment complete      What is the highest level of mobility based on the mobility assessment? Level 0 (Hold) - At risk for rapid decline or arrest with movement or actively dying Level 0 (Hold) - At risk for rapid decline or arrest with movement or actively dying         Diet: Diet Orders (From admission, onward)     Start     Ordered   03/03/24 1231  Diet NPO time specified Except for: Other (See Comments)  Diet effective now       Comments: Okay for puree snacks from RN floor including applesauce, pudding, and yogurt  Question:  Except for  Answer:  Other (See Comments)   03/03/24 1230            Barriers to discharge: None Disposition Plan: CIR versus SNF HH orders placed: TBD Status is: Inpatient  Objective: Blood pressure 115/70, pulse (!) 104, temperature (!) 100.5 F (38.1 C), temperature source Axillary, resp. rate (!) 21, height 5' 7 (1.702 m), weight 93.6 kg, last menstrual period  05/10/2013, SpO2 92%.  Examination:  Physical Exam Constitutional:      Appearance: Normal appearance.  HENT:     Head: Normocephalic and atraumatic.     Comments: Cortrak in place    Mouth/Throat:     Mouth: Mucous membranes are moist.  Eyes:     Extraocular Movements: Extraocular movements intact.  Cardiovascular:     Rate and Rhythm: Normal rate and regular rhythm.  Pulmonary:     Effort: Pulmonary effort is normal. No respiratory distress.     Breath sounds: Normal breath sounds. No wheezing.  Abdominal:     General: Bowel sounds are normal. There is no distension.     Palpations: Abdomen is soft.     Tenderness: There is no abdominal tenderness.  Musculoskeletal:        General: Normal range of motion.     Cervical back:  Normal range of motion and neck supple.  Skin:    General: Skin is warm and dry.  Neurological:     Mental Status: She is alert.     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  Able to move left lower extremity a little bit to command.  Severe dysarthria.  Able to state her name  Psychiatric:        Mood and Affect: Mood normal.      Consultants:  Neurology Vascular surgery Pulmonology  Procedures:    Data Reviewed: Results for orders placed or performed during the hospital encounter of 03/01/24 (from the past 24 hours)  Glucose, capillary     Status: Abnormal   Collection Time: 03/03/24  4:22 PM  Result Value Ref Range   Glucose-Capillary 116 (H) 70 - 99 mg/dL  Glucose, capillary     Status: None   Collection Time: 03/03/24  8:12 PM  Result Value Ref Range   Glucose-Capillary 97 70 - 99 mg/dL  Phosphorus     Status: None   Collection Time: 03/03/24  8:33 PM  Result Value Ref Range   Phosphorus 3.0 2.5 - 4.6 mg/dL  Glucose, capillary     Status: Abnormal   Collection Time: 03/03/24 11:51 PM  Result Value Ref Range   Glucose-Capillary 123 (H) 70 - 99 mg/dL  Glucose, capillary     Status: Abnormal   Collection Time:  03/04/24  4:11 AM  Result Value Ref Range   Glucose-Capillary 127 (H) 70 - 99 mg/dL  Basic metabolic panel with GFR     Status: Abnormal   Collection Time: 03/04/24  4:58 AM  Result Value Ref Range   Sodium 148 (H) 135 - 145 mmol/L   Potassium 3.9 3.5 - 5.1 mmol/L   Chloride 113 (H) 98 - 111 mmol/L   CO2 26 22 - 32 mmol/L   Glucose, Bld 135 (H) 70 - 99 mg/dL   BUN 39 (H) 8 - 23 mg/dL   Creatinine, Ser 8.27 (H) 0.44 - 1.00 mg/dL   Calcium  9.4 8.9 - 10.3 mg/dL   GFR, Estimated 31 (L) >60 mL/min   Anion gap 10 5 - 15  CBC     Status: Abnormal   Collection Time: 03/04/24  4:58 AM  Result Value Ref Range   WBC 14.1 (H) 4.0 - 10.5 K/uL   RBC 3.62 (L) 3.87 - 5.11 MIL/uL   Hemoglobin 10.3 (L) 12.0 - 15.0 g/dL   HCT 66.5 (L) 63.9 - 53.9 %   MCV 92.3 80.0 - 100.0 fL   MCH 28.5 26.0 - 34.0 pg   MCHC 30.8 30.0 - 36.0 g/dL   RDW 83.6 (H) 88.4 - 84.4 %   Platelets 189 150 - 400 K/uL   nRBC 0.0 0.0 - 0.2 %  CK     Status: Abnormal   Collection Time: 03/04/24  4:58 AM  Result Value Ref Range   Total CK 246 (H) 38 - 234 U/L  Magnesium      Status: Abnormal   Collection Time: 03/04/24  4:58 AM  Result Value Ref Range   Magnesium  2.8 (H) 1.7 - 2.4 mg/dL  Phosphorus     Status: None   Collection Time: 03/04/24  4:58 AM  Result Value Ref Range   Phosphorus 2.6 2.5 - 4.6 mg/dL  Glucose, capillary     Status: Abnormal   Collection Time: 03/04/24  8:12 AM  Result Value Ref Range   Glucose-Capillary 139 (H) 70 -  99 mg/dL  Glucose, capillary     Status: Abnormal   Collection Time: 03/04/24 11:42 AM  Result Value Ref Range   Glucose-Capillary 145 (H) 70 - 99 mg/dL    I have reviewed pertinent nursing notes, vitals, labs, and images as necessary. I have ordered labwork to follow up on as indicated.  I have reviewed the last notes from staff over past 24 hours. I have discussed patient's care plan and test results with nursing staff, CM/SW, and other staff as appropriate.  Old records  reviewed in assessment of this patient  Time spent: Greater than 50% of the 55 minute visit was spent in counseling/coordination of care for the patient as laid out in the A&P.   LOS: 3 days   Alm Apo, MD [facility] 03/04/2024, 2:34 PM "

## 2024-03-04 NOTE — Progress Notes (Signed)
 Speech Language Pathology Treatment: Dysphagia  Patient Details Name: Erin Riley MRN: [id number] DOB: 1950/06/09 Today's Date: 03/04/2024 Time: 9083-9074 SLP Time Calculation (min) (ACUTE ONLY): 9 min  Assessment / Plan / Recommendation Clinical Impression  Pt was seen for skilled ST targeting dysphagia.  Pt was encountered asleep in bed and roused to moderate verbal and tactile stimulation.  She required frequent stimulation to maintain her level of alertness throughout this session.  SLP completed oral care prior to PO trials.  Pt was seen with tsp of puree x4.  Pt exhibited good labial closure around the spoon with suspected prolonged bolus formation and delayed swallow initiation.  A delayed throat clear was observed x1 with no additional s/sx of aspiration noted.  Pt demonstrated good oral clearance.  No additional trials were attempted on this date secondary to lethargy.    PLAN: Recommend continuation of NPO with allowance for intermittent floor snacks of puree with strict adherence to aspiration precautions and full RN supervision.  Please complete oral care prior to and following PO intake.  Pt will require a repeat instrumental swallow study prior to diet initiation.  SLP will continue to f/u per POC.     HPI HPI: Erin Riley is a 73 y.o. female who presented to the hospital from home on 03/01/24 after being found by her son with left sided weakness, slurred speech and facial droop. She was intubated upon arrival to ER. CXR did not show acute abnormality, CT chest did not show any focal consolidation or PE. 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 She was extubated on 03/02/24 at 1305 to oxygen via [place]. PMH: HTN, severe aortic stenosis, nonischemic cardiomyopathy, CKD stage IIIb, schizophrenia. MBSS completed on 12/29 with recommendation to continue NPO with the exception of puree snacks.      SLP Plan  Continue with current  plan of care        Swallow Evaluation Recommendations   Recommendations: NPO (except floor snacks of puree with strict supervision) Medication Administration: Via alternative means Oral care recommendations: Oral care QID (4x/day);Oral care before PO Caregiver Recommendations: Have oral suction available     Recommendations                     Oral care QID;Staff/trained caregiver to provide oral care   Frequent or constant Supervision/Assistance Dysphagia, oropharyngeal phase (R13.12)     Continue with current plan of care    Erin Cable, M.S., CCC-SLP Acute Rehabilitation Services Office: [phone]  Erin Riley [facility]  03/04/2024, 9:31 AM

## 2024-03-05 DIAGNOSIS — I639 Cerebral infarction, unspecified: Secondary | ICD-10-CM | POA: Diagnosis not present

## 2024-03-05 LAB — CBC WITH DIFFERENTIAL/PLATELET
Abs Immature Granulocytes: 0.08 K/uL — ABNORMAL HIGH (ref 0.00–0.07)
Basophils Absolute: 0 K/uL (ref 0.0–0.1)
Basophils Relative: 0 %
Eosinophils Absolute: 0.2 K/uL (ref 0.0–0.5)
Eosinophils Relative: 1 %
HCT: 33.5 % — ABNORMAL LOW (ref 36.0–46.0)
Hemoglobin: 10.5 g/dL — ABNORMAL LOW (ref 12.0–15.0)
Immature Granulocytes: 1 %
Lymphocytes Relative: 10 %
Lymphs Abs: 1.4 K/uL (ref 0.7–4.0)
MCH: 29.3 pg (ref 26.0–34.0)
MCHC: 31.3 g/dL (ref 30.0–36.0)
MCV: 93.6 fL (ref 80.0–100.0)
Monocytes Absolute: 1.3 K/uL — ABNORMAL HIGH (ref 0.1–1.0)
Monocytes Relative: 9 %
Neutro Abs: 11.2 K/uL — ABNORMAL HIGH (ref 1.7–7.7)
Neutrophils Relative %: 79 %
Platelets: 231 K/uL (ref 150–400)
RBC: 3.58 MIL/uL — ABNORMAL LOW (ref 3.87–5.11)
RDW: 16.3 % — ABNORMAL HIGH (ref 11.5–15.5)
WBC: 14.2 K/uL — ABNORMAL HIGH (ref 4.0–10.5)
nRBC: 0 % (ref 0.0–0.2)

## 2024-03-05 LAB — MAGNESIUM: Magnesium: 2.9 mg/dL — ABNORMAL HIGH (ref 1.7–2.4)

## 2024-03-05 LAB — BASIC METABOLIC PANEL WITH GFR
Anion gap: 11 (ref 5–15)
BUN: 36 mg/dL — ABNORMAL HIGH (ref 8–23)
CO2: 25 mmol/L (ref 22–32)
Calcium: 9.9 mg/dL (ref 8.9–10.3)
Chloride: 115 mmol/L — ABNORMAL HIGH (ref 98–111)
Creatinine, Ser: 1.63 mg/dL — ABNORMAL HIGH (ref 0.44–1.00)
GFR, Estimated: 33 mL/min — ABNORMAL LOW
Glucose, Bld: 151 mg/dL — ABNORMAL HIGH (ref 70–99)
Potassium: 3.9 mmol/L (ref 3.5–5.1)
Sodium: 150 mmol/L — ABNORMAL HIGH (ref 135–145)

## 2024-03-05 LAB — GLUCOSE, CAPILLARY
Glucose-Capillary: 132 mg/dL — ABNORMAL HIGH (ref 70–99)
Glucose-Capillary: 136 mg/dL — ABNORMAL HIGH (ref 70–99)
Glucose-Capillary: 138 mg/dL — ABNORMAL HIGH (ref 70–99)
Glucose-Capillary: 149 mg/dL — ABNORMAL HIGH (ref 70–99)
Glucose-Capillary: 151 mg/dL — ABNORMAL HIGH (ref 70–99)
Glucose-Capillary: 154 mg/dL — ABNORMAL HIGH (ref 70–99)

## 2024-03-05 LAB — PHOSPHORUS: Phosphorus: 2.8 mg/dL (ref 2.5–4.6)

## 2024-03-05 MED ORDER — AMLODIPINE BESYLATE 5 MG PO TABS: 5.0000 mg | ORAL_TABLET | Freq: Every day | ORAL | Status: DC

## 2024-03-05 MED ORDER — AMLODIPINE BESYLATE 5 MG PO TABS
5.0000 mg | ORAL_TABLET | Freq: Every day | ORAL | Status: DC
  Administered 2024-03-05 – 2024-03-06 (×2): 5 mg
  Filled 2024-03-05 (×2): qty 1

## 2024-03-05 MED ORDER — AMLODIPINE BESYLATE 10 MG PO TABS: 10.0000 mg | ORAL_TABLET | Freq: Every day | ORAL | Status: DC

## 2024-03-05 MED ORDER — FREE WATER
200.0000 mL | Status: DC
  Administered 2024-03-05 – 2024-03-17 (×68): 200 mL

## 2024-03-05 MED ORDER — AMOXICILLIN-POT CLAVULANATE 875-125 MG PO TABS
1.0000 | ORAL_TABLET | Freq: Two times a day (BID) | ORAL | Status: AC
  Administered 2024-03-05 – 2024-03-07 (×6): 1
  Filled 2024-03-05 (×6): qty 1

## 2024-03-05 NOTE — TOC Progression Note (Signed)
 Transition of Care [facility]) - Progression Note    Patient Details  Name: Erin Riley MRN: [id number] Date of Birth: 30-Mar-1950  [facility] CM/SW Contact  Montie LOISE Louder, [place] Phone Number: 03/05/2024, 9:54 AM  Clinical Narrative:     Per chart review - recommendation for CIR- per CIR, Patient is not at a level to tolerate the intensity of CIR at this time but they will continue to follow for progress. TOC will continue to follow and assist with discharge planning.    Montie Louder, MSW, LCSW Clinical Social Worker    Expected Discharge Plan: IP Rehab Facility Barriers to Discharge: Continued Medical Work up               Expected Discharge Plan and Services   Discharge Planning Services: CM Consult Post Acute Care Choice: IP Rehab Living arrangements for the past 2 months: Single Family Home                                       Social Drivers of Health (SDOH) Interventions SDOH Screenings   Food Insecurity: No Food Insecurity (03/02/2024)  Housing: Low Risk (03/02/2024)  Transportation Needs: No Transportation Needs (03/02/2024)  Utilities: Not At Risk (03/02/2024)  Alcohol Screen: Low Risk (12/20/2023)  Social Connections: Unknown (03/02/2024)  Tobacco Use: High Risk (03/01/2024)    Readmission Risk Interventions    06/14/2023   11:56 AM 09/14/2022   10:20 AM  Readmission Risk Prevention Plan  Transportation Screening Complete Complete  PCP or Specialist Appt within 3-5 Days Complete Complete  HRI or Home Care Consult Complete Complete  Social Work Consult for Recovery Care Planning/Counseling  Complete  Palliative Care Screening Not Applicable Not Applicable  Medication Review Oceanographer) Complete Referral to Pharmacy

## 2024-03-05 NOTE — Progress Notes (Signed)
 Physical Therapy Treatment Patient Details Name: Erin Riley MRN: [id number] DOB: 08-10-1950 Today's Date: 03/05/2024   History of Present Illness Pt is a 73 y/o F admitted 03/01/24 after being found down by her son with left sided weakness, slurred speech and facial droop, also intermittent episodes of apnea. 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 She was intubated 12/27-28/25. PMH includes HTN, hypertrophic cardiomyopathy, PAD, CKD III, HLD, schizophrenia, AAA.    PT Comments  Patient eager for apple sauce and used to encourage to find midline and to use L UE and for slow pace and managing bolus fully.  She was able to finally achieve S for sitting balance end of session and was not eager to return to supine though did not want to attempt to stand.  She will continue to benefit from skilled PT In the acute setting and from post-acute inpatient rehab at d/c.     If plan is discharge home, recommend the following: Two people to help with walking and/or transfers;Two people to help with bathing/dressing/bathroom;Assistance with cooking/housework;Assistance with feeding;Direct supervision/assist for medications management;Assist for transportation;Direct supervision/assist for financial management;Supervision due to cognitive status   Can travel by Doctor, Hospital (measurements PT);Wheelchair cushion (measurements PT);Hospital bed;Hoyer lift    Recommendations for Other Services       Precautions / Restrictions Precautions Precautions: Fall Recall of Precautions/Restrictions: Impaired Precaution/Restrictions Comments: Goal SBP 100-140, L inattention, R gaze preference, cortrak, R mitt     Mobility  Bed Mobility Overal bed mobility: Needs Assistance Bed Mobility: Supine to Sit, Sit to Supine     Supine to sit: HOB elevated, Max assist Sit to  supine: Total assist, +2 for safety/equipment, +2 for physical assistance   General bed mobility comments: assist for legs off EOB and to lift trunk; pt keeping R leg on bed till assisted off for balance in sitting; to supine assist for legs and trunk as pt resisitant    Transfers Overall transfer level: Needs assistance Equipment used: 2 person hand held assist Transfers: Sit to/from Stand Sit to Stand: From elevated surface, +2 physical assistance, Total assist           General transfer comment: initiated attempt to stand though pt not assisting then refusing to attempt    Ambulation/Gait                   Stairs             Wheelchair Mobility     Tilt Bed    Modified Rankin (Stroke Patients Only) Modified Rankin (Stroke Patients Only) Pre-Morbid Rankin Score: No symptoms Modified Rankin: Severe disability     Balance Overall balance assessment: Needs assistance   Sitting balance-Leahy Scale: Poor Sitting balance - Comments: mod A occasional max A, able to sit with close S end of session; L lateral lean much of session, able to correct at times with some cues                                    Communication Communication Communication: Impaired Factors Affecting Communication: Difficulty expressing self;Reduced clarity of speech  Cognition Arousal: Alert Behavior During Therapy: Restless, Agitated   PT - Cognitive impairments: Awareness, Memory, Attention, Initiation, Sequencing, Problem solving, Safety/Judgement  Following commands: Impaired Following commands impaired: Follows one step commands inconsistently    Cueing Cueing Techniques: Verbal cues, Gestural cues, Tactile cues, Visual cues  Exercises      General Comments General comments (skin integrity, edema, etc.): seated EOB to eat apple sauce with R hand with max cues for finding target in midling to R of midline and for slowing and  taking time to swallow, twice coughed though noted cleared bolus and SpO2 remained 93% or higher on RA; cues throughout to keep from pulling on cortrak as pt blowing her nose and wiping her face; SBP initially 150's and then RN delivered meds, was 113 systolic after about 30 minutes rest      Pertinent Vitals/Pain Pain Assessment Faces Pain Scale: Hurts little more Pain Location: generalized with mobility Pain Descriptors / Indicators: Grimacing, Moaning Pain Intervention(s): Monitored during session, Repositioned, Patient requesting pain meds-RN notified    Home Living                          Prior Function            PT Goals (current goals can now be found in the care plan section) Progress towards PT goals: Progressing toward goals    Frequency    Min 3X/week      PT Plan      Co-evaluation              AM-PAC PT 6 Clicks Mobility   Outcome Measure  Help needed turning from your back to your side while in a flat bed without using bedrails?: Total Help needed moving from lying on your back to sitting on the side of a flat bed without using bedrails?: Total Help needed moving to and from a bed to a chair (including a wheelchair)?: Total Help needed standing up from a chair using your arms (e.g., wheelchair or bedside chair)?: Total Help needed to walk in hospital room?: Total Help needed climbing 3-5 steps with a railing? : Total 6 Click Score: 6    End of Session Equipment Utilized During Treatment: Gait belt Activity Tolerance: Patient limited by fatigue Patient left: in bed;with call bell/phone within reach;with bed alarm set;with restraints reapplied   PT Visit Diagnosis: Other abnormalities of gait and mobility (R26.89);Muscle weakness (generalized) (M62.81);Other symptoms and signs involving the nervous system (R29.898);Hemiplegia and hemiparesis Hemiplegia - Right/Left: Left Hemiplegia - dominant/non-dominant: Non-dominant Hemiplegia -  caused by: Cerebral infarction     Time: 1200-1226 PT Time Calculation (min) (ACUTE ONLY): 26 min  Charges:    $Therapeutic Activity: 23-37 mins PT General Charges $$ ACUTE PT VISIT: 1 Visit                     Micheline Portal, PT Acute Rehabilitation Services [phone] 03/05/2024    Montie Portal 03/05/2024, 2:28 PM

## 2024-03-05 NOTE — Care Management Important Message (Signed)
 Important Message  Patient Details  Name: Erin Riley MRN: [id number] Date of Birth: Apr 17, 1950   Important Message Given:  Yes - Medicare IM     Vonzell Arrie Sharps 03/05/2024, 10:53 AM

## 2024-03-05 NOTE — Plan of Care (Signed)
   Problem: Coping: Goal: Ability to adjust to condition or change in health will improve Outcome: Progressing

## 2024-03-05 NOTE — Progress Notes (Signed)
 " Progress Note    Erin Riley   FMW:[id number]  DOB: February 22, 1951  DOA: 03/01/2024     4 PCP: [name], MD  Initial CC: Found down at home  Hospital Course: Erin Riley is a 73 yo female with PMH CAD, CKD, HLD, HTN, PAD, schizophrenia, severe AS who was found on the floor at home.  She had dense left-sided weakness, slurred speech, facial droop.  She required intubation upon arrival to the ER.  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  MRI brain showed acute right MCA territory infarct with 2 mm leftward midline shift.  She was able to be weaned off pressors and extubated.  Also evaluated by vascular surgery.    Assessment and Plan    Subacute R MCA CVA  -Presented outside of window for intervention. Etiology felt most likely cardioembolic  - Appreciate stroke team's management.  Agree that stroke was likely cardioembolic, although no LV thrombus seen on echo -SBP goal is complex:  enlarging aortic aneurysms, hx AS -- need to avoid significant HTN with rupture risk. 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  -not a candidate for open or endovascular repair at this time. See [facility] note 12/28 for  details   Hx NICM  HFrEF -- LVEF 25-30%  Severe AS -not a TAVR candidate  -GDMT limited at this time w renal dysfxn    AKI on CKD3b, improving  Elevated CK, improving  - dc foley  - Renal function improving and back to baseline   DM2 w hyperglycemia  -SSI   Leukocytosis + low grade temp -some upper lobe rhonchi, plausible aspirations  -MRSA PCR neg -has a foley -Transition antibiotics to Augmentin  to complete course  Interval History:  Resting comfortably when seen this morning.  No events overnight.  Still dense left-sided weakness.  Antimicrobials:   DVT prophylaxis:  SCDs Start: 03/01/24 1745 apixaban  (ELIQUIS ) tablet 5 mg   Code Status:   Code Status: Full Code  Mobility Assessment (Last 72 Hours)     Mobility Assessment     Row Name 03/05/24 1426 03/05/24 0842 03/04/24 2134 03/04/24 1200 03/04/24 0800   Does the patient have exclusion criteria? -- No- Perform mobility assessment No- Perform mobility assessment -- No- Perform mobility assessment   Mobility Assessment Exclusion Criteria -- -- No exclusion criteria present, perform mobility assessment -- --   What is the highest level of mobility based on the mobility assessment? Level 1 (Bedfast) - Unable to balance while sitting on edge of bed Level 1 (Bedfast) - Unable to balance while sitting on edge of bed Level 1 (Bedfast) - Unable to balance while sitting on edge of bed Level 1 (Bedfast) - Unable to balance while sitting on edge of bed Level 1 (Bedfast) - Unable to balance while sitting on edge of bed   Is the above  level different from baseline mobility prior to current illness? -- Yes - Recommend PT order Yes - Recommend PT order -- --    Row Name 03/04/24 0422 03/04/24 0002 03/03/24 2004 03/03/24 1400 03/03/24 1200   Does the patient have exclusion criteria? No- Perform mobility assessment No- Perform mobility assessment No- Perform mobility assessment -- --   What is the highest level of mobility based on  the mobility assessment? Level 1 (Bedfast) - Unable to balance while sitting on edge of bed Level 1 (Bedfast) - Unable to balance while sitting on edge of bed Level 1 (Bedfast) - Unable to balance while sitting on edge of bed Level 1 (Bedfast) - Unable to balance while sitting on edge of bed Level 1 (Bedfast) - Unable to balance while sitting on edge of bed   Is the above level different from baseline mobility prior to current illness? Yes - Recommend PT order Yes - Recommend PT order Yes - Recommend PT order -- --    Row Name 03/03/24 1117 03/03/24 0800 03/02/24 2000       Does the patient have exclusion criteria? No- Perform mobility assessment No- Perform mobility assessment Yes- Bedfast (Level 1) - Select exclusion criteria in next row     Mobility Assessment Exclusion Criteria No exclusion criteria present, perform mobility assessment No exclusion criteria present, perform mobility assessment No exclusion criteria present, perform mobility assessment     What is the highest level of mobility based on the mobility assessment? Level 1 (Bedfast) - Unable to balance while sitting on edge of bed Level 1 (Bedfast) - Unable to balance while sitting on edge of bed Level 1 (Bedfast) - Unable to balance while sitting on edge of bed     Is the above level different from baseline mobility prior to current illness? Yes - Recommend PT order Yes - Recommend PT order Yes - Recommend PT order        Diet: Diet Orders (From admission, onward)     Start     Ordered   03/03/24 1231  Diet NPO time specified Except for: Other (See Comments)  Diet effective now       Comments: Okay for puree snacks from RN floor including applesauce, pudding, and yogurt  Question:  Except for  Answer:  Other (See Comments)   03/03/24 1230            Barriers to discharge: None Disposition Plan: CIR versus SNF HH orders placed: TBD Status is: Inpatient  Objective: Blood pressure (!) 137/90, pulse (!) 102, temperature  98.6 F (37 C), resp. rate (!) 26, height 5' 7 (1.702 m), weight 94.1 kg, last menstrual period 05/10/2013, SpO2 94%.  Examination:  Physical Exam Constitutional:      Appearance: Normal appearance.  HENT:     Head: Normocephalic and atraumatic.     Comments: Cortrak in place    Mouth/Throat:     Mouth: Mucous membranes are moist.  Eyes:     Extraocular Movements: Extraocular movements intact.  Cardiovascular:     Rate and Rhythm: Normal rate and regular rhythm.  Pulmonary:     Effort: Pulmonary effort is normal. No respiratory distress.     Breath sounds: Normal breath sounds. No wheezing.  Abdominal:     General: Bowel sounds are normal. There is no distension.     Palpations: Abdomen is soft.     Tenderness: There is no abdominal tenderness.  Musculoskeletal:        General:  Normal range of motion.     Cervical back: Normal range of motion and neck supple.  Skin:    General: Skin is warm and dry.  Neurological:     Mental Status: She is alert.     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  Able to move left lower extremity a little bit to command.  Severe dysarthria.  Able to state her name  Psychiatric:        Mood and Affect: Mood normal.      Consultants:  Neurology Vascular surgery Pulmonology  Procedures:    Data Reviewed: Results for orders placed or performed during the hospital encounter of 03/01/24 (from the past 24 hours)  Glucose, capillary     Status: Abnormal   Collection Time: 03/04/24  4:55 PM  Result Value Ref Range   Glucose-Capillary 160 (H) 70 - 99 mg/dL  Glucose, capillary     Status: Abnormal   Collection Time: 03/04/24  7:52 PM  Result Value Ref Range   Glucose-Capillary 147 (H) 70 - 99 mg/dL  Glucose, capillary     Status: Abnormal   Collection Time: 03/04/24 11:52 PM  Result Value Ref Range   Glucose-Capillary 137 (H) 70 - 99 mg/dL  Glucose, capillary     Status: Abnormal   Collection Time: 03/05/24  3:36 AM   Result Value Ref Range   Glucose-Capillary 149 (H) 70 - 99 mg/dL  Magnesium      Status: Abnormal   Collection Time: 03/05/24  4:07 AM  Result Value Ref Range   Magnesium  2.9 (H) 1.7 - 2.4 mg/dL  Phosphorus     Status: None   Collection Time: 03/05/24  4:07 AM  Result Value Ref Range   Phosphorus 2.8 2.5 - 4.6 mg/dL  Basic metabolic panel with GFR     Status: Abnormal   Collection Time: 03/05/24  4:07 AM  Result Value Ref Range   Sodium 150 (H) 135 - 145 mmol/L   Potassium 3.9 3.5 - 5.1 mmol/L   Chloride 115 (H) 98 - 111 mmol/L   CO2 25 22 - 32 mmol/L   Glucose, Bld 151 (H) 70 - 99 mg/dL   BUN 36 (H) 8 - 23 mg/dL   Creatinine, Ser 8.36 (H) 0.44 - 1.00 mg/dL   Calcium  9.9 8.9 - 10.3 mg/dL   GFR, Estimated 33 (L) >60 mL/min   Anion gap 11 5 - 15  CBC with Differential/Platelet     Status: Abnormal   Collection Time: 03/05/24  4:07 AM  Result Value Ref Range   WBC 14.2 (H) 4.0 - 10.5 K/uL   RBC 3.58 (L) 3.87 - 5.11 MIL/uL   Hemoglobin 10.5 (L) 12.0 - 15.0 g/dL   HCT 66.4 (L) 63.9 - 53.9 %   MCV 93.6 80.0 - 100.0 fL   MCH 29.3 26.0 - 34.0 pg   MCHC 31.3 30.0 - 36.0 g/dL   RDW 83.6 (H) 88.4 - 84.4 %   Platelets 231 150 - 400 K/uL   nRBC 0.0 0.0 - 0.2 %   Neutrophils Relative % 79 %   Neutro Abs 11.2 (H) 1.7 - 7.7 K/uL   Lymphocytes Relative 10 %   Lymphs Abs 1.4 0.7 - 4.0 K/uL   Monocytes Relative 9 %   Monocytes Absolute 1.3 (H) 0.1 - 1.0 K/uL   Eosinophils Relative 1 %   Eosinophils Absolute 0.2 0.0 - 0.5 K/uL   Basophils Relative 0 %   Basophils Absolute 0.0 0.0 -  0.1 K/uL   Immature Granulocytes 1 %   Abs Immature Granulocytes 0.08 (H) 0.00 - 0.07 K/uL  Glucose, capillary     Status: Abnormal   Collection Time: 03/05/24  8:22 AM  Result Value Ref Range   Glucose-Capillary 136 (H) 70 - 99 mg/dL  Glucose, capillary     Status: Abnormal   Collection Time: 03/05/24 11:49 AM  Result Value Ref Range   Glucose-Capillary 154 (H) 70 - 99 mg/dL    I have reviewed  pertinent nursing notes, vitals, labs, and images as necessary. I have ordered labwork to follow up on as indicated.  I have reviewed the last notes from staff over past 24 hours. I have discussed patient's care plan and test results with nursing staff, CM/SW, and other staff as appropriate.  Old records reviewed in assessment of this patient  Time spent: Greater than 50% of the 55 minute visit was spent in counseling/coordination of care for the patient as laid out in the A&P.   LOS: 4 days   Alm Apo, MD [facility] 03/05/2024, 4:10 PM "

## 2024-03-05 NOTE — Plan of Care (Signed)
  Problem: Fluid Volume: Goal: Ability to maintain a balanced intake and output will improve Outcome: Progressing   Problem: Metabolic: Goal: Ability to maintain appropriate glucose levels will improve Outcome: Progressing   Problem: Nutritional: Goal: Maintenance of adequate nutrition will improve Outcome: Progressing   Problem: Tissue Perfusion: Goal: Adequacy of tissue perfusion will improve Outcome: Progressing

## 2024-03-06 DIAGNOSIS — I639 Cerebral infarction, unspecified: Secondary | ICD-10-CM | POA: Diagnosis not present

## 2024-03-06 DIAGNOSIS — R1310 Dysphagia, unspecified: Secondary | ICD-10-CM | POA: Diagnosis not present

## 2024-03-06 DIAGNOSIS — R471 Dysarthria and anarthria: Secondary | ICD-10-CM | POA: Diagnosis not present

## 2024-03-06 LAB — CBC WITH DIFFERENTIAL/PLATELET
Abs Immature Granulocytes: 0.05 K/uL (ref 0.00–0.07)
Basophils Absolute: 0 K/uL (ref 0.0–0.1)
Basophils Relative: 0 %
Eosinophils Absolute: 0.4 K/uL (ref 0.0–0.5)
Eosinophils Relative: 3 %
HCT: 34.8 % — ABNORMAL LOW (ref 36.0–46.0)
Hemoglobin: 10.5 g/dL — ABNORMAL LOW (ref 12.0–15.0)
Immature Granulocytes: 0 %
Lymphocytes Relative: 12 %
Lymphs Abs: 1.5 K/uL (ref 0.7–4.0)
MCH: 28.4 pg (ref 26.0–34.0)
MCHC: 30.2 g/dL (ref 30.0–36.0)
MCV: 94.1 fL (ref 80.0–100.0)
Monocytes Absolute: 1.1 K/uL — ABNORMAL HIGH (ref 0.1–1.0)
Monocytes Relative: 10 %
Neutro Abs: 8.7 K/uL — ABNORMAL HIGH (ref 1.7–7.7)
Neutrophils Relative %: 75 %
Platelets: 280 K/uL (ref 150–400)
RBC: 3.7 MIL/uL — ABNORMAL LOW (ref 3.87–5.11)
RDW: 16.1 % — ABNORMAL HIGH (ref 11.5–15.5)
WBC: 11.7 K/uL — ABNORMAL HIGH (ref 4.0–10.5)
nRBC: 0 % (ref 0.0–0.2)

## 2024-03-06 LAB — BASIC METABOLIC PANEL WITH GFR
Anion gap: 11 (ref 5–15)
BUN: 33 mg/dL — ABNORMAL HIGH (ref 8–23)
CO2: 25 mmol/L (ref 22–32)
Calcium: 9.8 mg/dL (ref 8.9–10.3)
Chloride: 115 mmol/L — ABNORMAL HIGH (ref 98–111)
Creatinine, Ser: 1.41 mg/dL — ABNORMAL HIGH (ref 0.44–1.00)
GFR, Estimated: 39 mL/min — ABNORMAL LOW
Glucose, Bld: 133 mg/dL — ABNORMAL HIGH (ref 70–99)
Potassium: 4.2 mmol/L (ref 3.5–5.1)
Sodium: 150 mmol/L — ABNORMAL HIGH (ref 135–145)

## 2024-03-06 LAB — GLUCOSE, CAPILLARY
Glucose-Capillary: 101 mg/dL — ABNORMAL HIGH (ref 70–99)
Glucose-Capillary: 124 mg/dL — ABNORMAL HIGH (ref 70–99)
Glucose-Capillary: 125 mg/dL — ABNORMAL HIGH (ref 70–99)
Glucose-Capillary: 145 mg/dL — ABNORMAL HIGH (ref 70–99)
Glucose-Capillary: 148 mg/dL — ABNORMAL HIGH (ref 70–99)
Glucose-Capillary: 148 mg/dL — ABNORMAL HIGH (ref 70–99)

## 2024-03-06 LAB — MAGNESIUM: Magnesium: 2.6 mg/dL — ABNORMAL HIGH (ref 1.7–2.4)

## 2024-03-06 MED ORDER — CARVEDILOL 3.125 MG PO TABS
3.1250 mg | ORAL_TABLET | Freq: Two times a day (BID) | ORAL | Status: DC
  Administered 2024-03-06 (×2): 3.125 mg
  Filled 2024-03-06 (×2): qty 1

## 2024-03-06 MED ORDER — NICOTINE 21 MG/24HR TD PT24
21.0000 mg | MEDICATED_PATCH | Freq: Every day | TRANSDERMAL | Status: AC
  Administered 2024-03-06 – 2024-03-12 (×7): 21 mg via TRANSDERMAL
  Filled 2024-03-06 (×7): qty 1

## 2024-03-06 MED ORDER — AMLODIPINE BESYLATE 10 MG PO TABS
10.0000 mg | ORAL_TABLET | Freq: Every day | ORAL | Status: DC
  Administered 2024-03-07 – 2024-03-17 (×11): 10 mg
  Filled 2024-03-06 (×11): qty 1

## 2024-03-06 MED ORDER — AMLODIPINE BESYLATE 5 MG PO TABS
5.0000 mg | ORAL_TABLET | Freq: Once | ORAL | Status: AC
  Administered 2024-03-06: 5 mg
  Filled 2024-03-06: qty 1

## 2024-03-06 NOTE — Progress Notes (Signed)
 " Progress Note    Erin Riley   FMW:[id number]  DOB: 25-Dec-1950  DOA: 03/01/2024     5 PCP: [name], MD  Initial CC: Found down at home  Hospital Course: Erin Riley is a 74 yo female with PMH CAD, CKD, HLD, HTN, PAD, schizophrenia, severe AS who was found on the floor at home.  She had dense left-sided weakness, slurred speech, facial droop.  She required intubation upon arrival to the ER.  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  MRI brain showed acute right MCA territory infarct with 2 mm leftward midline shift.  She was able to be weaned off pressors and extubated.  Also evaluated by vascular surgery.   Assessment and Plan    Subacute R MCA CVA  -Presented outside of window for intervention. Etiology felt most likely cardioembolic  - Appreciate stroke team's management.  Agree that stroke was likely cardioembolic, although no LV thrombus seen on echo -SBP goal is complex:  enlarging aortic aneurysms, hx AS -- need to avoid significant HTN with rupture risk. 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  -not a candidate for open or endovascular repair at this time. See [facility]VS note 12/28 for  details   Hx NICM  HFrEF -- LVEF 25-30%  Severe AS -not a TAVR candidate  -GDMT limited at this time w renal dysfxn    AKI on CKD3b, improving  Elevated CK, improving  - dc foley  - Renal function improving and back to baseline   DM2 w hyperglycemia  -SSI   Leukocytosis + low grade temp -some upper lobe rhonchi, plausible aspirations  -MRSA PCR neg -has a foley -Transition antibiotics to Augmentin  to complete course  Interval History:  Resting comfortably when seen this morning.  No events overnight.  Still dense left-sided weakness.  Antimicrobials:   DVT prophylaxis:  SCDs Start: 03/01/24 1745 apixaban  (ELIQUIS ) tablet 5 mg   Code Status:   Code Status: Full Code  Mobility Assessment (Last 72 Hours)     Mobility Assessment     Row Name 03/05/24 2200 03/05/24 1426 03/05/24 0842 03/04/24 2134 03/04/24 1200   Does the patient have exclusion criteria? No- Perform mobility assessment -- No- Perform mobility assessment No- Perform mobility assessment --   Mobility Assessment Exclusion Criteria No exclusion criteria present, perform mobility assessment -- -- No exclusion criteria present, perform mobility assessment --   What is the highest level of mobility based on the mobility assessment? Level 1 (Bedfast) - Unable to balance while sitting on edge of bed Level 1 (Bedfast) - Unable to balance while sitting on edge of bed Level 1 (Bedfast) - Unable to balance while sitting on edge of bed Level 1 (Bedfast) - Unable to balance while sitting on edge of bed Level 1 (Bedfast) - Unable to balance while sitting on edge of bed  Is the above level different from baseline mobility prior to current illness? Yes - Recommend PT order -- Yes - Recommend PT order Yes - Recommend PT order --    Row Name 03/04/24 0800 03/04/24 0422 03/04/24 0002 03/03/24 2004     Does the patient have exclusion criteria? No- Perform mobility assessment No- Perform mobility assessment No- Perform mobility  assessment No- Perform mobility assessment    What is the highest level of mobility based on the mobility assessment? Level 1 (Bedfast) - Unable to balance while sitting on edge of bed Level 1 (Bedfast) - Unable to balance while sitting on edge of bed Level 1 (Bedfast) - Unable to balance while sitting on edge of bed Level 1 (Bedfast) - Unable to balance while sitting on edge of bed    Is the above level different from baseline mobility prior to current illness? -- Yes - Recommend PT order Yes - Recommend PT order Yes - Recommend PT order       Diet: Diet Orders (From admission, onward)     Start     Ordered   03/03/24 1231  Diet NPO time specified Except for: Other (See Comments)  Diet effective now       Comments: Okay for puree snacks from RN floor including applesauce, pudding, and yogurt  Question:  Except for  Answer:  Other (See Comments)   03/03/24 1230            Barriers to discharge: None Disposition Plan: CIR versus SNF HH orders placed: TBD Status is: Inpatient  Objective: Blood pressure (!) 152/104, pulse (!) 104, temperature 98.8 F (37.1 C), temperature source Oral, resp. rate 20, height 5' 7 (1.702 m), weight 96.1 kg, last menstrual period 05/10/2013, SpO2 95%.  Examination:  Physical Exam Constitutional:      Appearance: Normal appearance.  HENT:     Head: Normocephalic and atraumatic.     Comments: Cortrak in place    Mouth/Throat:     Mouth: Mucous membranes are moist.  Eyes:     Extraocular Movements: Extraocular movements intact.  Cardiovascular:     Rate and Rhythm: Normal rate and regular rhythm.  Pulmonary:     Effort: Pulmonary effort is normal. No respiratory distress.     Breath sounds: Normal breath sounds. No wheezing.  Abdominal:     General: Bowel sounds are normal. There is no distension.     Palpations: Abdomen is soft.     Tenderness: There is no abdominal tenderness.  Musculoskeletal:        General: Normal range of motion.      Cervical back: Normal range of motion and neck supple.  Skin:    General: Skin is warm and dry.  Neurological:     Mental Status: She is alert.     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  Able to move left lower extremity a little bit to command.  Severe dysarthria.  Generalized discoordination with movable extremities  Psychiatric:        Mood and Affect: Mood normal.      Consultants:  Neurology Vascular surgery Pulmonology  Procedures:    Data Reviewed: Results for orders placed or performed during the hospital encounter of 03/01/24 (from the past 24 hours)  Glucose, capillary     Status: Abnormal   Collection Time: 03/05/24  4:04 PM  Result Value Ref Range   Glucose-Capillary 132 (H) 70 - 99 mg/dL  Glucose, capillary  Status: Abnormal   Collection Time: 03/05/24  8:03 PM  Result Value Ref Range   Glucose-Capillary 138 (H) 70 - 99 mg/dL  Glucose, capillary     Status: Abnormal   Collection Time: 03/05/24 11:54 PM  Result Value Ref Range   Glucose-Capillary 151 (H) 70 - 99 mg/dL  Glucose, capillary     Status: Abnormal   Collection Time: 03/06/24  4:13 AM  Result Value Ref Range   Glucose-Capillary 125 (H) 70 - 99 mg/dL  Magnesium      Status: Abnormal   Collection Time: 03/06/24  4:50 AM  Result Value Ref Range   Magnesium  2.6 (H) 1.7 - 2.4 mg/dL  Basic metabolic panel with GFR     Status: Abnormal   Collection Time: 03/06/24  4:50 AM  Result Value Ref Range   Sodium 150 (H) 135 - 145 mmol/L   Potassium 4.2 3.5 - 5.1 mmol/L   Chloride 115 (H) 98 - 111 mmol/L   CO2 25 22 - 32 mmol/L   Glucose, Bld 133 (H) 70 - 99 mg/dL   BUN 33 (H) 8 - 23 mg/dL   Creatinine, Ser 8.58 (H) 0.44 - 1.00 mg/dL   Calcium  9.8 8.9 - 10.3 mg/dL   GFR, Estimated 39 (L) >60 mL/min   Anion gap 11 5 - 15  CBC with Differential/Platelet     Status: Abnormal   Collection Time: 03/06/24  4:50 AM  Result Value Ref Range   WBC 11.7 (H) 4.0 - 10.5 K/uL   RBC 3.70 (L)  3.87 - 5.11 MIL/uL   Hemoglobin 10.5 (L) 12.0 - 15.0 g/dL   HCT 65.1 (L) 63.9 - 53.9 %   MCV 94.1 80.0 - 100.0 fL   MCH 28.4 26.0 - 34.0 pg   MCHC 30.2 30.0 - 36.0 g/dL   RDW 83.8 (H) 88.4 - 84.4 %   Platelets 280 150 - 400 K/uL   nRBC 0.0 0.0 - 0.2 %   Neutrophils Relative % 75 %   Neutro Abs 8.7 (H) 1.7 - 7.7 K/uL   Lymphocytes Relative 12 %   Lymphs Abs 1.5 0.7 - 4.0 K/uL   Monocytes Relative 10 %   Monocytes Absolute 1.1 (H) 0.1 - 1.0 K/uL   Eosinophils Relative 3 %   Eosinophils Absolute 0.4 0.0 - 0.5 K/uL   Basophils Relative 0 %   Basophils Absolute 0.0 0.0 - 0.1 K/uL   Immature Granulocytes 0 %   Abs Immature Granulocytes 0.05 0.00 - 0.07 K/uL  Glucose, capillary     Status: Abnormal   Collection Time: 03/06/24  8:31 AM  Result Value Ref Range   Glucose-Capillary 148 (H) 70 - 99 mg/dL  Glucose, capillary     Status: Abnormal   Collection Time: 03/06/24  1:17 PM  Result Value Ref Range   Glucose-Capillary 124 (H) 70 - 99 mg/dL    I have reviewed pertinent nursing notes, vitals, labs, and images as necessary. I have ordered labwork to follow up on as indicated.  I have reviewed the last notes from staff over past 24 hours. I have discussed patient's care plan and test results with nursing staff, CM/SW, and other staff as appropriate.  Old records reviewed in assessment of this patient  Time spent: Greater than 50% of the 55 minute visit was spent in counseling/coordination of care for the patient as laid out in the A&P.   LOS: 5 days   Alm Apo, MD [facility] 03/06/2024, 3:54 PM "

## 2024-03-07 DIAGNOSIS — I639 Cerebral infarction, unspecified: Secondary | ICD-10-CM | POA: Diagnosis not present

## 2024-03-07 LAB — GLUCOSE, CAPILLARY
Glucose-Capillary: 150 mg/dL — ABNORMAL HIGH (ref 70–99)
Glucose-Capillary: 118 mg/dL — ABNORMAL HIGH (ref 70–99)
Glucose-Capillary: 124 mg/dL — ABNORMAL HIGH (ref 70–99)
Glucose-Capillary: 122 mg/dL — ABNORMAL HIGH (ref 70–99)
Glucose-Capillary: 154 mg/dL — ABNORMAL HIGH (ref 70–99)

## 2024-03-07 MED ORDER — CARVEDILOL 6.25 MG PO TABS
6.2500 mg | ORAL_TABLET | Freq: Two times a day (BID) | ORAL | Status: DC
  Administered 2024-03-07 – 2024-03-11 (×9): 6.25 mg
  Filled 2024-03-07 (×9): qty 1

## 2024-03-07 NOTE — Progress Notes (Signed)
 Physical Therapy Treatment Patient Details Name: Erin Riley MRN: [id number] DOB: [birth year] Today's Date: 03/07/2024   History of Present Illness Pt is a 74 y/o F admitted 03/01/24 after being found down by her son with left sided weakness, slurred speech and facial droop, also intermittent episodes of apnea. 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 She was intubated 12/27-28/25. PMH includes HTN, hypertrophic cardiomyopathy, PAD, CKD III, HLD, schizophrenia, AAA.    PT Comments  Patient demonstrating improvement in sitting balance and less assist initially for bed mobility.  Feel with +2 A she could potentially be able to transition OOB though concern for SBP out of range even in supine and pt somewhat agitated with mobility and therapy efforts (again with concern for elevating BP).  She continues to be limited with L side awareness including body and spatial.  She needs help for L arm positioning and frequently dangles L leg off bed with pressure against bed rail.  NT and RN in to assist to supine as pt resistant at end of session.  PT will continue to follow.     If plan is discharge home, recommend the following: Two people to help with walking and/or transfers;Two people to help with bathing/dressing/bathroom;Assistance with cooking/housework;Assistance with feeding;Direct supervision/assist for medications management;Assist for transportation;Direct supervision/assist for financial management;Supervision due to cognitive status   Can travel by Doctor, Hospital (measurements PT);Wheelchair cushion (measurements PT);Hospital bed;Hoyer lift    Recommendations for Other Services       Precautions / Restrictions Precautions Precautions: Fall Precaution/Restrictions Comments: Goal SBP 100-140, L inattention, R gaze preference, cortrak     Mobility   Bed Mobility Overal bed mobility: Needs Assistance Bed Mobility: Supine to Sit     Supine to sit: HOB elevated, Mod assist Sit to supine: Total assist, +2 for safety/equipment, +2 for physical assistance   General bed mobility comments: assist for trunk upright and scooting R hip out, though pt pushing PT away when trying to help with balance then pt able to balance herself; to supine pt resistance so A of 2 for to supine and positioning    Transfers                   General transfer comment: deferred OOB due to concern for SBP >140 (148)    Ambulation/Gait                   Stairs             Wheelchair Mobility     Tilt Bed    Modified Rankin (Stroke Patients Only) Modified Rankin (Stroke Patients Only) Pre-Morbid Rankin Score: No symptoms Modified Rankin: Severe disability     Balance Overall balance assessment: Needs assistance   Sitting balance-Leahy Scale: Fair Sitting balance - Comments: able to sit EOB with S and A For L hand placement in lap; pt refusing activities in sitting including eating pudding, placing lotion on her extremities, etc                                    Communication Communication Communication: Impaired Factors Affecting Communication: Difficulty expressing self;Reduced clarity of speech  Cognition Arousal: Alert Behavior During Therapy: Agitated, Restless   PT - Cognitive impairments: Awareness, Memory, Attention, Initiation, Sequencing, Problem  solving, Safety/Judgement                       PT - Cognition Comments: frustrated easily with mobility efforts and eventually trying to stay sitting at EOB despite concern for safety, RN to redirect and assist for return to supine Following commands: Impaired Following commands impaired: Follows one step commands inconsistently    Cueing    Exercises Other Exercises Other Exercises: A with PROM L UE in supine including scapular mobs and  placing L hand on her face for attention and attempt to have pt activate Other Exercises: supine AAROM lateral trunk rotation, and hooklying march, attempt to facilitate for bridging though pt not following    General Comments General comments (skin integrity, edema, etc.): pt agitated with PT attempting to assist with multiple tasks and eventually to return to supine pt pushing PT away so assist of RN and NT to position in bed,  BP initially 148/87, she refused for me to take it in sitting (attempting to avoid further agitation for increasing BP)      Pertinent Vitals/Pain Pain Assessment Pain Assessment: Faces Faces Pain Scale: Hurts little more Pain Location: R shoulder and generalized with mobility Pain Descriptors / Indicators: Guarding, Grimacing, Moaning Pain Intervention(s): Monitored during session, Limited activity within patient's tolerance, Repositioned    Home Living                          Prior Function            PT Goals (current goals can now be found in the care plan section) Progress towards PT goals: Progressing toward goals    Frequency    Min 3X/week      PT Plan      Co-evaluation              AM-PAC PT 6 Clicks Mobility   Outcome Measure  Help needed turning from your back to your side while in a flat bed without using bedrails?: A Lot Help needed moving from lying on your back to sitting on the side of a flat bed without using bedrails?: A Lot Help needed moving to and from a bed to a chair (including a wheelchair)?: Total Help needed standing up from a chair using your arms (e.g., wheelchair or bedside chair)?: Total Help needed to walk in hospital room?: Total Help needed climbing 3-5 steps with a railing? : Total 6 Click Score: 8    End of Session   Activity Tolerance: Treatment limited secondary to agitation Patient left: in bed;with call bell/phone within reach;with bed alarm set;with nursing/sitter in room   PT  Visit Diagnosis: Other abnormalities of gait and mobility (R26.89);Muscle weakness (generalized) (M62.81);Other symptoms and signs involving the nervous system (R29.898);Hemiplegia and hemiparesis Hemiplegia - Right/Left: Left Hemiplegia - dominant/non-dominant: Non-dominant Hemiplegia - caused by: Cerebral infarction     Time: 1119-1202 PT Time Calculation (min) (ACUTE ONLY): 43 min  Charges:    $Therapeutic Activity: 23-37 mins $Neuromuscular Re-education: 8-22 mins PT General Charges $$ ACUTE PT VISIT: 1 Visit                     Micheline Portal, PT Acute Rehabilitation Services [phone] 03/07/2024    Montie Portal 03/07/2024, 12:33 PM

## 2024-03-07 NOTE — Progress Notes (Signed)
 " Progress Note    Erin Riley   FMW:[id number]  DOB: [birth year]  DOA: 03/01/2024     6 PCP: [name], MD  Initial CC: Found down at home  Hospital Course: Ms. Colvin is a 74 yo female with PMH CAD, CKD, HLD, HTN, PAD, schizophrenia, severe AS who was found on the floor at home.  She had dense left-sided weakness, slurred speech, facial droop.  She required intubation upon arrival to the ER.  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  MRI brain showed acute right MCA territory infarct with 2 mm leftward midline shift.  She was able to be weaned off pressors and extubated.  Also evaluated by vascular surgery.   Assessment and Plan    Subacute R MCA CVA  -Presented outside of window for intervention. Etiology felt most likely cardioembolic  - Appreciate stroke team's management.  Agree that stroke was likely cardioembolic, although no LV thrombus seen on echo -SBP goal is complex:  enlarging aortic aneurysms, hx AS -- need to avoid significant HTN with rupture risk. 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  -not a candidate for open or endovascular repair at this time. See VVS note 12/28 for  details   Hx NICM  HFrEF -- LVEF 25-30%  Severe AS -not a TAVR candidate  -GDMT limited at this time w renal dysfxn    AKI on CKD3b, improving  Elevated CK, improving  - dc foley  - Renal function improving and back to baseline   DM2 w hyperglycemia  -SSI   Leukocytosis + low grade temp -some upper lobe rhonchi, plausible aspirations  -MRSA PCR neg -has a foley -Transition antibiotics to Augmentin  to complete course  Interval History:  Resting comfortably when seen this morning.  No events overnight.  Still dense left-sided weakness. Follows commands easily.  Antimicrobials:   DVT prophylaxis:  SCDs Start: 03/01/24 1745 apixaban  (ELIQUIS ) tablet 5 mg   Code Status:   Code Status: Full Code  Mobility Assessment (Last 72 Hours)     Mobility Assessment     Row Name 03/07/24 1230 03/06/24 2000 03/06/24 0800 03/05/24 2200 03/05/24 1426   Does the patient have exclusion criteria? -- No- Perform mobility assessment No- Perform mobility assessment No- Perform mobility assessment --   Mobility Assessment Exclusion Criteria -- -- No exclusion criteria present, perform mobility assessment No exclusion criteria present, perform mobility assessment --   What is the highest level of mobility based on the mobility assessment? Level 2 (Chairfast) - Balance while sitting on edge of bed and cannot stand Level 1 (Bedfast) - Unable to balance while sitting on edge of bed Level 1 (Bedfast) - Unable to balance while sitting on edge of bed Level 1 (Bedfast) - Unable to balance while sitting on edge of bed Level 1 (Bedfast) - Unable to balance while sitting  on edge of bed   Is the above level different from baseline mobility prior to current illness? -- Yes - Recommend PT order Yes - Recommend PT order Yes - Recommend PT order --    Row Name 03/05/24 9157 03/04/24 2134         Does the patient have exclusion criteria? No- Perform mobility assessment No- Perform mobility assessment       Mobility Assessment Exclusion Criteria -- No exclusion criteria present, perform mobility assessment      What is the highest level of mobility based on the mobility assessment? Level 1 (Bedfast) - Unable to balance while sitting on edge of bed Level 1 (Bedfast) - Unable to balance while sitting on edge of bed      Is the above level different from baseline mobility prior to current illness? Yes - Recommend PT order Yes - Recommend PT order         Diet: Diet Orders (From admission, onward)     Start     Ordered   03/03/24 1231  Diet NPO time specified Except for: Other (See Comments)  Diet effective now       Comments: Okay for puree snacks from RN floor including applesauce, pudding, and yogurt  Question:  Except for  Answer:  Other (See Comments)   03/03/24 1230            Barriers to discharge: None Disposition Plan: CIR versus SNF HH orders placed: TBD Status is: Inpatient  Objective: Blood pressure (!) 145/85, pulse (!) 40, temperature 97.9 F (36.6 C), temperature source Axillary, resp. rate (!) 36, height 5' 7 (1.702 m), weight 95 kg, last menstrual period 05/10/2013, SpO2 95%.  Examination:  Physical Exam Constitutional:      Appearance: Normal appearance.  HENT:     Head: Normocephalic and atraumatic.     Comments: Cortrak in place    Mouth/Throat:     Mouth: Mucous membranes are moist.  Eyes:     Extraocular Movements: Extraocular movements intact.  Cardiovascular:     Rate and Rhythm: Normal rate and regular rhythm.  Pulmonary:     Effort: Pulmonary effort is normal. No respiratory distress.     Breath sounds: Normal breath sounds. No wheezing.  Abdominal:     General: Bowel sounds are normal. There is no distension.     Palpations: Abdomen is soft.     Tenderness: There is no abdominal tenderness.  Musculoskeletal:        General: Normal range of motion.     Cervical back: Normal range of motion and neck supple.  Skin:    General: Skin is warm and  dry.  Neurological:     Mental Status: She is alert.     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  Able to move left lower extremity a little bit to command but still some neglect with LLE as well.  Severe dysarthria.  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      Consultants:  Neurology Vascular surgery Pulmonology  Procedures:    Data Reviewed: Results for orders placed or performed during the hospital encounter of 03/01/24 (from the past 24 hours)  Glucose, capillary     Status: Abnormal   Collection Time: 03/06/24  4:43 PM  Result Value Ref Range   Glucose-Capillary 148 (H) 70 - 99 mg/dL  Glucose, capillary     Status: Abnormal  Collection Time: 03/06/24  7:58 PM  Result Value Ref Range   Glucose-Capillary 101 (H) 70 - 99 mg/dL  Glucose, capillary     Status: Abnormal   Collection Time: 03/06/24 11:41 PM  Result Value Ref Range   Glucose-Capillary 145 (H) 70 - 99 mg/dL  Glucose, capillary     Status: Abnormal   Collection Time: 03/07/24  3:32 AM  Result Value Ref Range   Glucose-Capillary 150 (H) 70 - 99 mg/dL  Glucose, capillary     Status: Abnormal   Collection Time: 03/07/24  8:07 AM  Result Value Ref Range   Glucose-Capillary 118 (H) 70 - 99 mg/dL  Glucose, capillary     Status: Abnormal   Collection Time: 03/07/24 11:52 AM  Result Value Ref Range   Glucose-Capillary 124 (H) 70 - 99 mg/dL    I have reviewed pertinent nursing notes, vitals, labs, and images as necessary. I have ordered labwork to follow up on as indicated.  I have reviewed the last notes from staff over past 24 hours. I have discussed patient's care plan and test results with nursing staff, CM/SW, and other staff as appropriate.  Old records reviewed in assessment of this patient    LOS: 6 days   Alm Apo, MD [facility] 03/07/2024, 3:44 PM "

## 2024-03-07 NOTE — Progress Notes (Signed)
 Nutrition Follow-up  DOCUMENTATION CODES:  Non-severe (moderate) malnutrition in context of chronic illness  INTERVENTION:  Continue TF via Cortrak:  Osmolite 1.2 at 60 ml/h Free water  flushes 200 ml q4h Provides 1728 kcal, 80 gm protein, 1166 ml free water  daily (2366 ml total free water  with flushes). Diet advancement as able per SLP.   NUTRITION DIAGNOSIS:  Moderate Malnutrition related to chronic illness as evidenced by mild fat depletion, mild muscle depletion; ongoing.  GOAL:  Patient will meet greater than or equal to 90% of their needs; met with TF.  MONITOR:  Diet advancement, TF tolerance, Labs, Weight trends, I & O's  REASON FOR ASSESSMENT:  Consult Enteral/tube feeding initiation and management  ASSESSMENT:  74 yo female admitted with subacute R MCA CVA with dysphagia, dysarthria and L sided deficits, +enlarging thoracoabdominal aortic aneurysm, AKI on CKD. PMH includes HTN, severe aortic stenosis, nonischemic CM, schizophrenia, dementia, CKD 3, DM  12/27 Admitted. Large MCA infarct with L sided deficits, dysphagia, dysarthria. 12/29 Cortrak placed, TF initiated. 12/30 S/P SLP evaluation, recommended to continue NPO status with allowance of floor snacks of puree with strict aspiration precautions and full RN supervision.    Patient requesting cold water  to drink, but she is NPO. Nurse tech has swabbed her mouth multiple times today.   Patient remains NPO. Receiving Osmolite 1.2 at 60 ml/h with free water  flushes 200 ml q4h via Cortrak.  Labs reviewed.  Na 150 (1/1) Free water  has been increased.  Phos and K have remained WNL. Mag 2.6 (1/1) CBG: 148-101-145-150-118  Medications reviewed and include colace, novolog  SSI q4h, miralax , thiamine .  Diet Order:   Diet Order             Diet NPO time specified Except for: Other (See Comments)  Diet effective now                  EDUCATION NEEDS:  Not appropriate for education at this time  Skin:  Skin  Assessment: Reviewed RN Assessment  Last BM:  1/1 type 6  Height:  Ht Readings from Last 1 Encounters:  03/01/24 5' 7 (1.702 m)   Weight:  Wt Readings from Last 1 Encounters:  03/07/24 95 kg   BMI:  Body mass index is 32.8 kg/m.  Estimated Nutritional Needs:  Kcal:  1650-1800 Protein:  70-80 g Fluid:  >/= 1.7L   Suzen HUNT RD, LDN, CNSC Contact via secure chat. If unavailable, use group chat RD Inpatient.

## 2024-03-07 NOTE — Plan of Care (Signed)
" °  Problem: Education: Goal: Ability to describe self-care measures that may prevent or decrease complications (Diabetes Survival Skills Education) will improve Outcome: Not Progressing   Problem: Coping: Goal: Ability to adjust to condition or change in health will improve Outcome: Not Progressing   Problem: Health Behavior/Discharge Planning: Goal: Ability to manage health-related needs will improve Outcome: Not Progressing   Problem: Nutritional: Goal: Maintenance of adequate nutrition will improve Outcome: Not Progressing Goal: Progress toward achieving an optimal weight will improve Outcome: Not Progressing   Problem: Education: Goal: Knowledge of disease or condition will improve Outcome: Not Progressing   "

## 2024-03-07 NOTE — Progress Notes (Signed)
 Speech Language Pathology Treatment: Dysphagia  Patient Details Name: Erin Riley MRN: [id number] DOB: [birth year] Today's Date: 03/07/2024 Time: 9074-9059 SLP Time Calculation (min) (ACUTE ONLY): 15 min  Assessment / Plan / Recommendation Clinical Impression  Pt seen for dysphagia therapy to trial therapeutic PO trials of honey-thick liquids by spoon. Pt consumed 4 oz without any anterior loss or immediate overt s/s of aspiration. Did observe delayed coughing >26mins post PO trials that could be related to airway violation, though unable to confirm this at bedside. Session ended early due to accident in bed. Nursing notified and pt left with nursing staff for clean up.   Recommend continue current NPO status with allowance of puree snacks from RN floor. Plan for repeat MBSS on Monday, 1/3, to see if [name] can safely advance to a oral diet.    HPI HPI: Erin Riley is a 74 y.o. female who presented to the hospital from home on 03/01/24 after being found by her son with left sided weakness, slurred speech and facial droop. She was intubated upon arrival to ER. CXR did not show acute abnormality, CT chest did not show any focal consolidation or PE. 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 She was extubated on 03/02/24 at 1305 to oxygen via [place]. PMH: HTN, severe aortic stenosis, nonischemic cardiomyopathy, CKD stage IIIb, schizophrenia. MBSS completed on 12/29 with recommendation to continue NPO with the exception of puree snacks.      SLP Plan  Continue with current plan of care;MBS (repeat MBSS)         Recommendations  Diet recommendations: NPO (with exception of puree snacks) Liquids provided via:  (no liquids) Medication Administration: Via alternative means (can crush in applesauce or pudding if Cortrak is clogged) Supervision: Full supervision/cueing for compensatory strategies;Staff to assist with self  feeding Compensations: Slow rate;Small sips/bites Postural Changes and/or Swallow Maneuvers: Seated upright 90 degrees;Upright 30-60 min after meal                  Oral care QID;Staff/trained caregiver to provide oral care   Frequent or constant Supervision/Assistance Dysphagia, oropharyngeal phase (R13.12)     Continue with current plan of care;MBS (repeat MBSS)     [name]  03/07/2024, 9:47 AM

## 2024-03-08 DIAGNOSIS — I639 Cerebral infarction, unspecified: Secondary | ICD-10-CM | POA: Diagnosis not present

## 2024-03-08 DIAGNOSIS — R471 Dysarthria and anarthria: Secondary | ICD-10-CM | POA: Diagnosis not present

## 2024-03-08 DIAGNOSIS — R1310 Dysphagia, unspecified: Secondary | ICD-10-CM | POA: Diagnosis not present

## 2024-03-08 LAB — CULTURE, BLOOD (ROUTINE X 2)
Culture: NO GROWTH
Culture: NO GROWTH
Special Requests: ADEQUATE
Special Requests: ADEQUATE

## 2024-03-08 LAB — GLUCOSE, CAPILLARY
Glucose-Capillary: 126 mg/dL — ABNORMAL HIGH (ref 70–99)
Glucose-Capillary: 128 mg/dL — ABNORMAL HIGH (ref 70–99)
Glucose-Capillary: 129 mg/dL — ABNORMAL HIGH (ref 70–99)
Glucose-Capillary: 131 mg/dL — ABNORMAL HIGH (ref 70–99)
Glucose-Capillary: 133 mg/dL — ABNORMAL HIGH (ref 70–99)
Glucose-Capillary: 161 mg/dL — ABNORMAL HIGH (ref 70–99)

## 2024-03-08 MED ORDER — HYDRALAZINE HCL 20 MG/ML IJ SOLN
10.0000 mg | Freq: Four times a day (QID) | INTRAMUSCULAR | Status: DC | PRN
  Administered 2024-03-08: 10 mg via INTRAVENOUS
  Filled 2024-03-08: qty 1

## 2024-03-08 NOTE — Progress Notes (Signed)
 " Progress Note    LARITA DEREMER   FMW:[id number]  DOB: 1950/10/22  DOA: 03/01/2024     7 PCP: [name], MD  Initial CC: Found down at home  Hospital Course: Ms. Gasser is a 74 yo female with PMH CAD, CKD, HLD, HTN, PAD, schizophrenia, severe AS who was found on the floor at home.  She had dense left-sided weakness, slurred speech, facial droop.  She required intubation upon arrival to the ER.  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  MRI brain showed acute right MCA territory infarct with 2 mm leftward midline shift.  She was able to be weaned off pressors and extubated.  Also evaluated by vascular surgery.   Assessment and Plan    Subacute R MCA CVA  -Presented outside of window for intervention. Etiology felt most likely cardioembolic  - Appreciate stroke team's management.  Agree that stroke was likely cardioembolic, although no LV thrombus seen on echo -SBP goal is complex:  enlarging aortic aneurysms, hx AS -- need to avoid significant HTN with rupture risk. 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  -not a candidate for open or endovascular repair at this time. See VVS note 12/28 for details   Hx NICM  HFrEF -- LVEF 25-30%  Severe AS -not a TAVR candidate  -GDMT limited at this time w renal dysfxn    AKI on CKD3b, improving  Elevated CK, improving  - dc foley  - Renal function improving and back to baseline   DM2 w hyperglycemia  -SSI   Leukocytosis + low grade temp -some upper lobe rhonchi, plausible aspirations  -MRSA PCR neg -has a foley -Transition antibiotics to Augmentin  to complete course  Interval History:  Resting comfortably when seen this morning.  No events overnight.  Still dense left-sided weakness.  Antimicrobials:   DVT prophylaxis:  SCDs Start: 03/01/24 1745 apixaban  (ELIQUIS ) tablet 5 mg   Code Status:   Code Status: Full Code  Mobility Assessment (Last 72 Hours)     Mobility Assessment     Row Name 03/08/24 0800 03/07/24 2053 03/07/24 1230 03/07/24 0800 03/06/24 2000   Does the patient have exclusion criteria? No- Perform mobility assessment No- Perform mobility assessment -- No- Perform mobility assessment No- Perform mobility assessment   What is the highest level of mobility based on the mobility assessment? Level 1 (Bedfast) - Unable to balance while sitting on edge of bed Level 1 (Bedfast) - Unable to balance while sitting on edge of bed Level 2 (Chairfast) - Balance while sitting on edge of bed and cannot stand Level 1 (Bedfast) - Unable to balance while sitting on edge of bed  Level 1 (Bedfast) - Unable to balance while sitting on edge of bed   Is the above level different from baseline mobility prior to current illness? Yes - Recommend PT order Yes - Recommend PT order -- Yes - Recommend PT order Yes - Recommend PT order    Row Name 03/06/24 0800 03/05/24 2200         Does the patient have exclusion criteria? No- Perform mobility assessment No- Perform mobility  assessment      Mobility Assessment Exclusion Criteria No exclusion criteria present, perform mobility assessment No exclusion criteria present, perform mobility assessment      What is the highest level of mobility based on the mobility assessment? Level 1 (Bedfast) - Unable to balance while sitting on edge of bed Level 1 (Bedfast) - Unable to balance while sitting on edge of bed      Is the above level different from baseline mobility prior to current illness? Yes - Recommend PT order Yes - Recommend PT order         Diet: Diet Orders (From admission, onward)     Start     Ordered   03/03/24 1231  Diet NPO time specified Except for: Other (See Comments)  Diet effective now       Comments: Okay for puree snacks from RN floor including applesauce, pudding, and yogurt  Question:  Except for  Answer:  Other (See Comments)   03/03/24 1230            Barriers to discharge: None Disposition Plan: CIR versus SNF HH orders placed: TBD Status is: Inpatient  Objective: Blood pressure (!) 125/94, pulse 93, temperature 98.9 F (37.2 C), temperature source Axillary, resp. rate 20, height 5' 7 (1.702 m), weight 96.3 kg, last menstrual period 05/10/2013, SpO2 99%.  Examination:  Physical Exam Constitutional:      Appearance: Normal appearance.  HENT:     Head: Normocephalic and atraumatic.     Comments: Cortrak in place    Mouth/Throat:     Mouth: Mucous membranes are moist.  Eyes:     Extraocular Movements: Extraocular movements intact.  Cardiovascular:     Rate and Rhythm: Normal rate and regular rhythm.  Pulmonary:     Effort: Pulmonary effort is normal. No respiratory distress.     Breath sounds: Normal breath sounds. No wheezing.  Abdominal:     General: Bowel sounds are normal. There is no distension.     Palpations: Abdomen is soft.     Tenderness: There is no abdominal tenderness.  Musculoskeletal:        General: Normal range of motion.     Cervical back: Normal range  of motion and neck supple.  Skin:    General: Skin is warm and dry.  Neurological:     Mental Status: She is alert.     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  Able to move left lower extremity a little bit to command but still some neglect with LLE as well.  Severe dysarthria.  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      Consultants:  Neurology Vascular surgery Pulmonology  Procedures:    Data Reviewed: Results for orders placed or performed during the hospital encounter of 03/01/24 (from the past 24 hours)  Glucose, capillary     Status: Abnormal   Collection Time: 03/07/24  5:20 PM  Result Value Ref Range  Glucose-Capillary 122 (H) 70 - 99 mg/dL  Glucose, capillary     Status: Abnormal   Collection Time: 03/07/24  7:58 PM  Result Value Ref Range   Glucose-Capillary 154 (H) 70 - 99 mg/dL  Glucose, capillary     Status: Abnormal   Collection Time: 03/08/24 12:09 AM  Result Value Ref Range   Glucose-Capillary 129 (H) 70 - 99 mg/dL  Glucose, capillary     Status: Abnormal   Collection Time: 03/08/24  4:18 AM  Result Value Ref Range   Glucose-Capillary 131 (H) 70 - 99 mg/dL  Glucose, capillary     Status: Abnormal   Collection Time: 03/08/24  8:38 AM  Result Value Ref Range   Glucose-Capillary 126 (H) 70 - 99 mg/dL  Glucose, capillary     Status: Abnormal   Collection Time: 03/08/24 12:26 PM  Result Value Ref Range   Glucose-Capillary 128 (H) 70 - 99 mg/dL    I have reviewed pertinent nursing notes, vitals, labs, and images as necessary. I have ordered labwork to follow up on as indicated.  I have reviewed the last notes from staff over past 24 hours. I have discussed patient's care plan and test results with nursing staff, CM/SW, and other staff as appropriate.  Old records reviewed in assessment of this patient    LOS: 7 days   Alm Apo, MD [facility] 03/08/2024, 3:00 PM "

## 2024-03-09 DIAGNOSIS — R471 Dysarthria and anarthria: Secondary | ICD-10-CM | POA: Diagnosis not present

## 2024-03-09 DIAGNOSIS — I639 Cerebral infarction, unspecified: Secondary | ICD-10-CM | POA: Diagnosis not present

## 2024-03-09 LAB — GLUCOSE, CAPILLARY
Glucose-Capillary: 120 mg/dL — ABNORMAL HIGH (ref 70–99)
Glucose-Capillary: 121 mg/dL — ABNORMAL HIGH (ref 70–99)
Glucose-Capillary: 122 mg/dL — ABNORMAL HIGH (ref 70–99)
Glucose-Capillary: 122 mg/dL — ABNORMAL HIGH (ref 70–99)
Glucose-Capillary: 124 mg/dL — ABNORMAL HIGH (ref 70–99)
Glucose-Capillary: 127 mg/dL — ABNORMAL HIGH (ref 70–99)
Glucose-Capillary: 137 mg/dL — ABNORMAL HIGH (ref 70–99)

## 2024-03-09 NOTE — Progress Notes (Signed)
 " Progress Note    Erin Riley   FMW:[id number]  DOB: Jul 02, 1950  DOA: 03/01/2024     8 PCP: [name], MD  Initial CC: Found down at home  Hospital Course: Erin Riley is a 74 yo female with PMH CAD, CKD, HLD, HTN, PAD, schizophrenia, severe AS who was found on the floor at home.  She had dense left-sided weakness, slurred speech, facial droop.  She required intubation upon arrival to the ER.  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  MRI brain showed acute right MCA territory infarct with 2 mm leftward midline shift.  She was able to be weaned off pressors and extubated.  Also evaluated by vascular surgery.   Assessment and Plan    Subacute R MCA CVA  -Presented outside of window for intervention. Etiology felt most likely cardioembolic  - Appreciate stroke team's management.  Agree that stroke was likely cardioembolic, although no LV thrombus seen on echo -SBP goal is complex:  enlarging aortic aneurysms, hx AS -- need to avoid significant HTN with rupture risk. 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  -not a candidate for open or endovascular repair at this time. See VVS note 12/28 for details   Hx NICM  HFrEF -- LVEF 25-30%  Severe AS -not a TAVR candidate  -GDMT limited at this time w renal dysfxn    AKI on CKD3b, improving  Elevated CK, improving  - dc foley  - Renal function improving and back to baseline   DM2 w hyperglycemia  -SSI   Leukocytosis + low grade temp -some upper lobe rhonchi, plausible aspirations  -MRSA PCR neg -has a foley -Transition antibiotics to Augmentin  to complete course  Interval History:  Very dysarthric speech and unable to understand much of anything this morning.  Otherwise still moving all her limbs the same as usual.  Continues to have dense left upper extremity weakness with neglect.  Antimicrobials:   DVT prophylaxis:  SCDs Start: 03/01/24 1745 apixaban  (ELIQUIS ) tablet 5 mg   Code Status:   Code Status: Full Code  Mobility Assessment (Last 72 Hours)     Mobility Assessment     Row Name 03/09/24 0732 03/08/24 2015 03/08/24 0800 03/07/24 2053 03/07/24 1230   Does the patient have exclusion criteria? No- Perform mobility assessment No- Perform mobility assessment No- Perform mobility assessment No- Perform mobility assessment --   What is the highest level of mobility based on the mobility assessment? Level 1 (Bedfast) - Unable to balance while sitting on edge of bed Level 1 (Bedfast) - Unable to balance while sitting on edge of bed Level 1 (Bedfast) - Unable to balance while  sitting on edge of bed Level 1 (Bedfast) - Unable to balance while sitting on edge of bed Level 2 (Chairfast) - Balance while sitting on edge of bed and cannot stand   Is the above level different from baseline mobility prior to current illness? Yes - Recommend PT order Yes - Recommend PT order Yes - Recommend PT order Yes - Recommend PT order --    Row Name 03/07/24 0800 03/06/24 2000          Does the patient have exclusion criteria? No- Perform mobility assessment No- Perform mobility assessment      What is the highest level of mobility based on the mobility assessment? Level 1 (Bedfast) - Unable to balance while sitting on edge of bed Level 1 (Bedfast) - Unable to balance while sitting on edge of bed      Is the above level different from baseline mobility prior to current illness? Yes - Recommend PT order Yes - Recommend PT order         Diet: Diet Orders (From admission, onward)     Start     Ordered   03/03/24 1231  Diet NPO time specified Except for: Other (See Comments)  Diet effective now       Comments: Okay for puree snacks from RN floor including applesauce, pudding, and yogurt  Question:  Except for  Answer:  Other (See Comments)   03/03/24 1230            Barriers to discharge: None Disposition Plan: CIR versus SNF HH orders placed: TBD Status is: Inpatient  Objective: Blood pressure (!) 156/94, pulse 93, temperature 98 F (36.7 C), temperature source Oral, resp. rate 20, height 5' 7 (1.702 m), weight 96.1 kg, last menstrual period 05/10/2013, SpO2 98%.  Examination:  Physical Exam Constitutional:      Appearance: Normal appearance.  HENT:     Head: Normocephalic and atraumatic.     Comments: Cortrak in place    Mouth/Throat:     Mouth: Mucous membranes are moist.  Eyes:     Extraocular Movements: Extraocular movements intact.  Cardiovascular:     Rate and Rhythm: Normal rate and regular rhythm.  Pulmonary:     Effort: Pulmonary effort is normal. No respiratory distress.     Breath sounds: Normal breath sounds. No wheezing.  Abdominal:     General: Bowel sounds are normal. There is no distension.     Palpations: Abdomen is soft.     Tenderness: There is no abdominal tenderness.  Musculoskeletal:        General: Normal range of motion.     Cervical back: Normal range of motion and neck supple.  Skin:    General: Skin is warm and  dry.  Neurological:     Mental Status: She is alert.     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  Able to move left lower extremity a little bit to command but still some neglect with LLE as well.  Severe dysarthria.  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      Consultants:  Neurology Vascular surgery Pulmonology  Procedures:    Data Reviewed: Results for orders placed or performed during the hospital encounter of 03/01/24 (from the past 24 hours)  Glucose, capillary     Status: Abnormal   Collection Time: 03/08/24  4:01 PM  Result Value Ref Range   Glucose-Capillary 161 (H)  70 - 99 mg/dL  Glucose, capillary     Status: Abnormal   Collection Time: 03/08/24  7:54 PM  Result Value Ref Range   Glucose-Capillary 133 (H) 70 - 99 mg/dL  Glucose, capillary     Status: Abnormal   Collection Time: 03/09/24 12:05 AM  Result Value Ref Range   Glucose-Capillary 120 (H) 70 - 99 mg/dL  Glucose, capillary     Status: Abnormal   Collection Time: 03/09/24  3:54 AM  Result Value Ref Range   Glucose-Capillary 122 (H) 70 - 99 mg/dL  Glucose, capillary     Status: Abnormal   Collection Time: 03/09/24  8:26 AM  Result Value Ref Range   Glucose-Capillary 137 (H) 70 - 99 mg/dL    I have reviewed pertinent nursing notes, vitals, labs, and images as necessary. I have ordered labwork to follow up on as indicated.  I have reviewed the last notes from staff over past 24 hours. I have discussed patient's care plan and test results with nursing staff, CM/SW, and other staff as appropriate.  Old records reviewed in assessment of this patient    LOS: 8 days   Alm Apo, MD [facility] 03/09/2024, 1:01 PM "

## 2024-03-10 ENCOUNTER — Inpatient Hospital Stay (HOSPITAL_COMMUNITY)

## 2024-03-10 LAB — GLUCOSE, CAPILLARY
Glucose-Capillary: 113 mg/dL — ABNORMAL HIGH (ref 70–99)
Glucose-Capillary: 117 mg/dL — ABNORMAL HIGH (ref 70–99)
Glucose-Capillary: 122 mg/dL — ABNORMAL HIGH (ref 70–99)
Glucose-Capillary: 143 mg/dL — ABNORMAL HIGH (ref 70–99)
Glucose-Capillary: 145 mg/dL — ABNORMAL HIGH (ref 70–99)
Glucose-Capillary: 93 mg/dL (ref 70–99)

## 2024-03-10 MED ORDER — JUVEN PO PACK
1.0000 | PACK | Freq: Two times a day (BID) | ORAL | Status: DC
  Administered 2024-03-10 – 2024-03-18 (×16): 1
  Filled 2024-03-10 (×17): qty 1

## 2024-03-10 NOTE — Progress Notes (Signed)
 " Progress Note    Erin Riley   FMW:[id number]  DOB: March 22, 1950  DOA: 03/01/2024     9 PCP: [name], MD  Initial CC: Found down at home  Hospital Course: Erin Riley is a 74 yo female with PMH CAD, CKD, HLD, HTN, PAD, schizophrenia, severe AS who was found on the floor at home.  She had dense left-sided weakness, slurred speech, facial droop.  She required intubation upon arrival to the ER.  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  MRI brain showed acute right MCA territory infarct with 2 mm leftward midline shift.  She was able to be weaned off pressors and extubated.  Also evaluated by vascular surgery.   Assessment and Plan    Subacute R MCA CVA  -Presented outside of window for intervention. Etiology felt most likely cardioembolic  - Appreciate stroke team's management.  Agree that stroke was likely cardioembolic, although no LV thrombus seen on echo -SBP goal is complex:  enlarging aortic aneurysms, hx AS -- need to avoid significant HTN with rupture risk. 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  -not a candidate for open or endovascular repair at this time. See VVS note 12/28 for details   Hx NICM  HFrEF -- LVEF 25-30%  Severe AS -not a TAVR candidate  -GDMT limited at this time w renal dysfxn    AKI on CKD3b, improving  Elevated CK, improving  - dc foley  - Renal function improving and back to baseline   DM2 w hyperglycemia  -SSI   Leukocytosis + low grade temp -some upper lobe rhonchi, plausible aspirations  -MRSA PCR neg -has a foley -Transition antibiotics to Augmentin  to complete course  Interval History:  Sleeping heavily this morning; seen after swallow study attempt. Very agitated and refused barium swallow study this morning.  Antimicrobials:   DVT prophylaxis:  SCDs Start: 03/01/24 1745 apixaban  (ELIQUIS ) tablet 5 mg   Code Status:   Code Status: Full Code  Mobility Assessment (Last 72 Hours)     Mobility Assessment     Row Name 03/10/24 0800 03/09/24 2000 03/09/24 0732 03/08/24 2015 03/08/24 0800   Does the patient have exclusion criteria? No- Perform mobility assessment No- Perform mobility assessment No- Perform mobility assessment No- Perform mobility assessment No- Perform mobility assessment   What is the highest level of mobility based on the mobility assessment? Level 1 (Bedfast) - Unable to balance while sitting on edge of bed Level 1 (Bedfast) - Unable to balance while sitting on edge of bed Level 1 (Bedfast) - Unable to balance while sitting on edge of bed Level 1 (Bedfast) - Unable to balance while  sitting on edge of bed Level 1 (Bedfast) - Unable to balance while sitting on edge of bed   Is the above level different from baseline mobility prior to current illness? Yes - Recommend PT order Yes - Recommend PT order Yes - Recommend PT order Yes - Recommend PT order Yes - Recommend PT order    Row Name 03/07/24 2053           Does the patient have exclusion  criteria? No- Perform mobility assessment       What is the highest level of mobility based on the mobility assessment? Level 1 (Bedfast) - Unable to balance while sitting on edge of bed       Is the above level different from baseline mobility prior to current illness? Yes - Recommend PT order          Diet: Diet Orders (From admission, onward)     Start     Ordered   03/03/24 1231  Diet NPO time specified Except for: Other (See Comments)  Diet effective now       Comments: Okay for puree snacks from RN floor including applesauce, pudding, and yogurt  Question:  Except for  Answer:  Other (See Comments)   03/03/24 1230            Barriers to discharge: None Disposition Plan: CIR versus SNF HH orders placed: TBD Status is: Inpatient  Objective: Blood pressure (!) 121/90, pulse 85, temperature 98 F (36.7 C), temperature source Axillary, resp. rate 20, height 5' 7 (1.702 m), weight 95.8 kg, last menstrual period 05/10/2013, SpO2 98%.  Examination:  Physical Exam Constitutional:      Appearance: Normal appearance.  HENT:     Head: Normocephalic and atraumatic.     Comments: Cortrak in place    Mouth/Throat:     Mouth: Mucous membranes are moist.  Eyes:     Extraocular Movements: Extraocular movements intact.  Cardiovascular:     Rate and Rhythm: Normal rate and regular rhythm.  Pulmonary:     Effort: Pulmonary effort is normal. No respiratory distress.     Breath sounds: Normal breath sounds. No wheezing.  Abdominal:     General: Bowel sounds are normal. There is no distension.     Palpations: Abdomen is soft.     Tenderness: There is no abdominal tenderness.  Musculoskeletal:        General: Normal range of motion.     Cervical back: Normal range of motion and neck supple.  Skin:    General: Skin is warm and dry.  Neurological:     Mental Status: She is alert.     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  Able to move left lower extremity a  little bit to command but still some neglect with LLE as well.  Severe dysarthria.  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      Consultants:  Neurology Vascular surgery Pulmonology  Procedures:    Data Reviewed: Results for orders placed or performed during the hospital encounter of 03/01/24 (from the past 24 hours)  Glucose, capillary     Status: Abnormal   Collection Time: 03/09/24  5:14 PM  Result Value Ref Range   Glucose-Capillary 122 (H) 70 - 99 mg/dL  Glucose, capillary     Status: Abnormal   Collection Time: 03/09/24  7:36 PM  Result Value Ref Range   Glucose-Capillary  124 (H) 70 - 99 mg/dL   Comment 1 Notify RN    Comment 2 Document in Chart   Glucose, capillary     Status: Abnormal   Collection Time: 03/09/24 11:22 PM  Result Value Ref Range   Glucose-Capillary 121 (H) 70 - 99 mg/dL   Comment 1 Notify RN    Comment 2 Document in Chart   Glucose, capillary     Status: Abnormal   Collection Time: 03/10/24  3:40 AM  Result Value Ref Range   Glucose-Capillary 143 (H) 70 - 99 mg/dL   Comment 1 Notify RN    Comment 2 Document in Chart   Glucose, capillary     Status: Abnormal   Collection Time: 03/10/24  8:56 AM  Result Value Ref Range   Glucose-Capillary 122 (H) 70 - 99 mg/dL  Glucose, capillary     Status: Abnormal   Collection Time: 03/10/24 11:54 AM  Result Value Ref Range   Glucose-Capillary 145 (H) 70 - 99 mg/dL    I have reviewed pertinent nursing notes, vitals, labs, and images as necessary. I have ordered labwork to follow up on as indicated.  I have reviewed the last notes from staff over past 24 hours. I have discussed patient's care plan and test results with nursing staff, CM/SW, and other staff as appropriate.  Old records reviewed in assessment of this patient    LOS: 9 days   Alm Apo, MD [facility] 03/10/2024, 1:28 PM "

## 2024-03-10 NOTE — Progress Notes (Signed)
 Physical Therapy Treatment Patient Details Name: Erin Riley MRN: [id number] DOB: [birth year] Today's Date: 03/10/2024   History of Present Illness Pt is a 74 y/o F admitted 03/01/24 after being found down by her son with left sided weakness, slurred speech and facial droop, also intermittent episodes of apnea. 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 She was intubated 12/27-28/25. PMH includes HTN, hypertrophic cardiomyopathy, PAD, CKD III, HLD, schizophrenia, AAA.    PT Comments  Pt resting in bed on arrival, eager for mobility and demonstrating steady progress towards acute goals. Session limited as pt BP increasing up to SBP parameters as set by MD (see general comments below). Pt able to come to sitting EOB with min A to manage trunk and LUE as pt with poor attention to L throughout often leaving LUE behind. Pt with poor motor planning to attempt standing with total A needed to elevate hips from sitting surface. Pt able to scoot along EOB to [facility] with mod A to maintain balance. BP stable in parameters at end of session. Pt continues to benefit from skilled PT services to progress toward functional mobility goals.     If plan is discharge home, recommend the following: Two people to help with walking and/or transfers;Two people to help with bathing/dressing/bathroom;Assistance with cooking/housework;Assistance with feeding;Direct supervision/assist for medications management;Assist for transportation;Direct supervision/assist for financial management;Supervision due to cognitive status   Can travel by Doctor, Hospital (measurements PT);Wheelchair cushion (measurements PT);Hospital bed;Hoyer lift    Recommendations for Other Services       Precautions / Restrictions Precautions Precautions: Fall Recall of Precautions/Restrictions:  Impaired Precaution/Restrictions Comments: Goal SBP 100-140, L inattention, R gaze preference, cortrak Restrictions Weight Bearing Restrictions Per Provider Order: No     Mobility  Bed Mobility Overal bed mobility: Needs Assistance Bed Mobility: Supine to Sit, Sit to Supine     Supine to sit: Min assist, HOB elevated Sit to supine: Min assist, HOB elevated   General bed mobility comments: pt bringing bil LEs to EOB quickly, min A to elevate trunk to sitting    Transfers Overall transfer level: Needs assistance Equipment used: 1 person hand held assist Transfers: Bed to chair/wheelchair/BSC, Sit to/from Stand Sit to Stand: Total assist          Lateral/Scoot Transfers: Mod assist General transfer comment: total A to attempt to stand x2 with poor motor planning, able to scoot laterally to [facility] with mod A for cues    Ambulation/Gait               General Gait Details: unable at this time   Stairs             Wheelchair Mobility     Tilt Bed    Modified Rankin (Stroke Patients Only) Modified Rankin (Stroke Patients Only) Pre-Morbid Rankin Score: No symptoms Modified Rankin: Severe disability     Balance Overall balance assessment: Needs assistance Sitting-balance support: Single extremity supported, Feet supported Sitting balance-Leahy Scale: Fair Sitting balance - Comments: able to sit EOB with S and A For L hand placement in lap; pt refusing activities in sitting including eating pudding, placing lotion on her extremities, etc  Communication Communication Communication: Impaired Factors Affecting Communication: Difficulty expressing self;Reduced clarity of speech  Cognition Arousal: Alert Behavior During Therapy: Restless   PT - Cognitive impairments: Awareness, Memory, Attention, Initiation, Sequencing, Problem solving, Safety/Judgement                         Following commands:  Impaired      Cueing Cueing Techniques: Verbal cues, Gestural cues, Tactile cues, Visual cues  Exercises      General Comments General comments (skin integrity, edema, etc.): BP 135/101 seated EOB initially, 143/111 seated EOB post lateral scoot, 129/104 supine with HOB at 31 degrees at end of session      Pertinent Vitals/Pain Pain Assessment Pain Assessment: Faces Faces Pain Scale: Hurts a little bit Pain Location: generalized with mobility Pain Descriptors / Indicators: Guarding, Grimacing, Moaning Pain Intervention(s): Monitored during session, Limited activity within patient's tolerance    Home Living                          Prior Function            PT Goals (current goals can now be found in the care plan section) Acute Rehab PT Goals Patient Stated Goal: none stated this session PT Goal Formulation: With patient/family Time For Goal Achievement: 03/17/24 Progress towards PT goals: Progressing toward goals    Frequency    Min 3X/week      PT Plan      Co-evaluation              AM-PAC PT 6 Clicks Mobility   Outcome Measure  Help needed turning from your back to your side while in a flat bed without using bedrails?: A Lot Help needed moving from lying on your back to sitting on the side of a flat bed without using bedrails?: A Lot Help needed moving to and from a bed to a chair (including a wheelchair)?: Total Help needed standing up from a chair using your arms (e.g., wheelchair or bedside chair)?: Total Help needed to walk in hospital room?: Total Help needed climbing 3-5 steps with a railing? : Total 6 Click Score: 8    End of Session Equipment Utilized During Treatment: Gait belt Activity Tolerance: Treatment limited secondary to agitation;Patient tolerated treatment well Patient left: in bed;with call bell/phone within reach;with bed alarm set Nurse Communication: Mobility status PT Visit Diagnosis: Other abnormalities of gait  and mobility (R26.89);Muscle weakness (generalized) (M62.81);Other symptoms and signs involving the nervous system (R29.898);Hemiplegia and hemiparesis Hemiplegia - Right/Left: Left Hemiplegia - dominant/non-dominant: Non-dominant Hemiplegia - caused by: Cerebral infarction     Time: 1406-1430 PT Time Calculation (min) (ACUTE ONLY): 24 min  Charges:    $Therapeutic Activity: 23-37 mins PT General Charges $$ ACUTE PT VISIT: 1 Visit                     Ryane Canavan R. PTA Acute Rehabilitation Services Office: [phone]   Therisa CHRISTELLA Boor 03/10/2024, 3:18 PM

## 2024-03-10 NOTE — Progress Notes (Signed)
 Speech Language Pathology  Patient Details Name: LAMONT GLASSCOCK MRN: [id number] DOB: 09/23/1950 Today's Date: 03/10/2024 Time:  -     Attempted MBS 0815 this morning and unfortunately she refused all trials with max encouragement. Will re- attempt another day at bedside to determine when/if appropriate. Afternoon or mid day likely better than earlier.                                Dustin Olam Bull  03/10/2024, 9:04 AM

## 2024-03-10 NOTE — Progress Notes (Signed)
 Nutrition Follow-up  DOCUMENTATION CODES:  Non-severe (moderate) malnutrition in context of chronic illness  INTERVENTION:  Continue TF via Cortrak: Osmolite 1.2 at 60 ml/h Free water  flushes 200 ml q4h Provides 1728 kcal, 80 gm protein, 1166 ml free water  daily (2366 ml total free water  with flushes). Add juven BID to support wound healing Recommend PEG tube placement if within goals of care for suspected long term need given inability to advance diet   NUTRITION DIAGNOSIS:  Moderate Malnutrition related to chronic illness as evidenced by mild fat depletion, mild muscle depletion. - remains applicable  GOAL:  Patient will meet greater than or equal to 90% of their needs - goal met via TF  MONITOR:  Diet advancement, TF tolerance, Labs, Weight trends, I & O's  REASON FOR ASSESSMENT:  Consult Enteral/tube feeding initiation and management  ASSESSMENT:  74 yo female admitted with subacute R MCA CVA with dysphagia, dysarthria and L sided deficits, +enlarging thoracoabdominal aortic aneurysm, AKI on CKD. PMH includes HTN, severe aortic stenosis, nonischemic CM, schizophrenia, dementia, CKD 3, DM  12/27 Admitted. Large MCA infarct with L sided deficits, dysphagia, dysarthria. 12/29 Cortrak placed, TF initiated. 12/30 s/p SLP evaluation, recommended to continue NPO status with allowance of floor snacks of puree with strict aspiration precautions and full RN supervision.  1/5 MBS- pt refused all trials   Pt resting soundly at time of visit. Tube feeds infusing at goal rate via Cortrak. RN at bedside. Denies any signs of intolerance.   Unfortunately diet unable to be advanced today d/t pt refusal of PO trials for MBS. Suspect pt will need PEG tube for more long term nutrition support. Will monitor SLP assessment and ongoing plan of care.   Admit weight: 96 kg Current weight: 95.8 kg  Medications: colace BID, SSI 0-15 units q4h, miralax  daily  Labs last updated 1/1 CBG's 121-145  x24 hours  Diet Order:   Diet Order             Diet NPO time specified Except for: Other (See Comments)  Diet effective now                   EDUCATION NEEDS:  Not appropriate for education at this time  Skin:  Skin Assessment: Skin Integrity Issues: Skin Integrity Issues:: Stage II Stage II: left buttock  Last BM:  1/4 type 6 large  Height:  Ht Readings from Last 1 Encounters:  03/01/24 5' 7 (1.702 m)    Weight:  Wt Readings from Last 1 Encounters:  03/10/24 95.8 kg   BMI:  Body mass index is 33.08 kg/m.  Estimated Nutritional Needs:   Kcal:  1650-1800  Protein:  70-80 g  Fluid:  >/= 1.7L  Royce Maris, RDN, LDN Clinical Nutrition See [facility] for contact information.

## 2024-03-11 ENCOUNTER — Encounter (HOSPITAL_COMMUNITY): Payer: Self-pay | Admitting: Pulmonary Disease

## 2024-03-11 LAB — GLUCOSE, CAPILLARY
Glucose-Capillary: 96 mg/dL (ref 70–99)
Glucose-Capillary: 134 mg/dL — ABNORMAL HIGH (ref 70–99)
Glucose-Capillary: 117 mg/dL — ABNORMAL HIGH (ref 70–99)
Glucose-Capillary: 125 mg/dL — ABNORMAL HIGH (ref 70–99)
Glucose-Capillary: 138 mg/dL — ABNORMAL HIGH (ref 70–99)

## 2024-03-11 LAB — CBC
HCT: 34.8 % — ABNORMAL LOW (ref 36.0–46.0)
Hemoglobin: 10.7 g/dL — ABNORMAL LOW (ref 12.0–15.0)
MCH: 28 pg (ref 26.0–34.0)
MCHC: 30.7 g/dL (ref 30.0–36.0)
MCV: 91.1 fL (ref 80.0–100.0)
Platelets: 484 K/uL — ABNORMAL HIGH (ref 150–400)
RBC: 3.82 MIL/uL — ABNORMAL LOW (ref 3.87–5.11)
RDW: 14.8 % (ref 11.5–15.5)
WBC: 10.2 K/uL (ref 4.0–10.5)
nRBC: 0 % (ref 0.0–0.2)

## 2024-03-11 MED ORDER — CEFAZOLIN SODIUM-DEXTROSE 2-4 GM/100ML-% IV SOLN
2.0000 g | INTRAVENOUS | Status: DC
  Filled 2024-03-11: qty 100

## 2024-03-11 MED ORDER — APIXABAN 5 MG PO TABS: 5.0000 mg | ORAL_TABLET | Freq: Two times a day (BID) | ORAL | Status: DC

## 2024-03-11 MED ORDER — CARVEDILOL 12.5 MG PO TABS
12.5000 mg | ORAL_TABLET | Freq: Two times a day (BID) | ORAL | Status: DC
  Administered 2024-03-11 – 2024-03-17 (×12): 12.5 mg
  Filled 2024-03-11 (×12): qty 1

## 2024-03-11 MED ORDER — ASPIRIN 81 MG PO CHEW: 81.0000 mg | CHEWABLE_TABLET | Freq: Every day | ORAL | Status: DC

## 2024-03-11 NOTE — Plan of Care (Signed)
 " Problem: Education: Goal: Ability to describe self-care measures that may prevent or decrease complications (Diabetes Survival Skills Education) will improve 03/11/2024 1720 by Gary Michaelis, RN Outcome: Progressing 03/11/2024 1036 by Gary Michaelis, RN Outcome: Progressing Goal: Individualized Educational Video(s) 03/11/2024 1720 by Gary Michaelis, RN Outcome: Progressing 03/11/2024 1036 by Gary Michaelis, RN Outcome: Progressing   Problem: Coping: Goal: Ability to adjust to condition or change in health will improve 03/11/2024 1720 by Gary Michaelis, RN Outcome: Progressing 03/11/2024 1036 by Gary Michaelis, RN Outcome: Progressing   Problem: Fluid Volume: Goal: Ability to maintain a balanced intake and output will improve 03/11/2024 1720 by Gary Michaelis, RN Outcome: Progressing 03/11/2024 1036 by Gary Michaelis, RN Outcome: Progressing   Problem: Health Behavior/Discharge Planning: Goal: Ability to identify and utilize available resources and services will improve 03/11/2024 1720 by Gary Michaelis, RN Outcome: Progressing 03/11/2024 1036 by Gary Michaelis, RN Outcome: Progressing Goal: Ability to manage health-related needs will improve 03/11/2024 1720 by Gary Michaelis, RN Outcome: Progressing 03/11/2024 1036 by Gary Michaelis, RN Outcome: Progressing   Problem: Metabolic: Goal: Ability to maintain appropriate glucose levels will improve 03/11/2024 1720 by Gary Michaelis, RN Outcome: Progressing 03/11/2024 1036 by Gary Michaelis, RN Outcome: Progressing   Problem: Nutritional: Goal: Maintenance of adequate nutrition will improve 03/11/2024 1720 by Gary Michaelis, RN Outcome: Progressing 03/11/2024 1036 by Gary Michaelis, RN Outcome: Progressing Goal: Progress toward achieving an optimal weight will improve 03/11/2024 1720 by Gary Michaelis, RN Outcome: Progressing 03/11/2024 1036 by Gary Michaelis, RN Outcome: Progressing   Problem:  Skin Integrity: Goal: Risk for impaired skin integrity will decrease 03/11/2024 1720 by Gary Michaelis, RN Outcome: Progressing 03/11/2024 1036 by Gary Michaelis, RN Outcome: Progressing   Problem: Tissue Perfusion: Goal: Adequacy of tissue perfusion will improve 03/11/2024 1720 by Gary Michaelis, RN Outcome: Progressing 03/11/2024 1036 by Gary Michaelis, RN Outcome: Progressing   Problem: Education: Goal: Knowledge of disease or condition will improve 03/11/2024 1720 by Gary Michaelis, RN Outcome: Progressing 03/11/2024 1036 by Gary Michaelis, RN Outcome: Progressing Goal: Knowledge of secondary prevention will improve (MUST DOCUMENT ALL) 03/11/2024 1720 by Gary Michaelis, RN Outcome: Progressing 03/11/2024 1036 by Gary Michaelis, RN Outcome: Progressing Goal: Knowledge of patient specific risk factors will improve (DELETE if not current risk factor) 03/11/2024 1720 by Gary Michaelis, RN Outcome: Progressing 03/11/2024 1036 by Gary Michaelis, RN Outcome: Progressing   Problem: Ischemic Stroke/TIA Tissue Perfusion: Goal: Complications of ischemic stroke/TIA will be minimized 03/11/2024 1720 by Gary Michaelis, RN Outcome: Progressing 03/11/2024 1036 by Gary Michaelis, RN Outcome: Progressing   Problem: Coping: Goal: Will verbalize positive feelings about self 03/11/2024 1720 by Gary Michaelis, RN Outcome: Progressing 03/11/2024 1036 by Gary Michaelis, RN Outcome: Progressing Goal: Will identify appropriate support needs 03/11/2024 1720 by Gary Michaelis, RN Outcome: Progressing 03/11/2024 1036 by Gary Michaelis, RN Outcome: Progressing   Problem: Health Behavior/Discharge Planning: Goal: Ability to manage health-related needs will improve 03/11/2024 1720 by Gary Michaelis, RN Outcome: Progressing 03/11/2024 1036 by Gary Michaelis, RN Outcome: Progressing Goal: Goals will be collaboratively established with patient/family 03/11/2024 1720 by  Gary Michaelis, RN Outcome: Progressing 03/11/2024 1036 by Gary Michaelis, RN Outcome: Progressing   Problem: Self-Care: Goal: Ability to participate in self-care as condition permits will improve 03/11/2024 1720 by Gary Michaelis, RN Outcome: Progressing 03/11/2024 1036 by Gary Michaelis, RN Outcome: Progressing Goal: Verbalization of feelings and concerns over difficulty with self-care will improve 03/11/2024 1720 by Gary Michaelis, RN Outcome: Progressing 03/11/2024 1036 by Gary Michaelis, RN Outcome: Progressing Goal: Ability to  communicate needs accurately will improve 03/11/2024 1720 by Gary Michaelis, RN Outcome: Progressing 03/11/2024 1036 by Gary Michaelis, RN Outcome: Progressing   Problem: Nutrition: Goal: Risk of aspiration will decrease 03/11/2024 1720 by Gary Michaelis, RN Outcome: Progressing 03/11/2024 1036 by Gary Michaelis, RN Outcome: Progressing Goal: Dietary intake will improve 03/11/2024 1720 by Gary Michaelis, RN Outcome: Progressing 03/11/2024 1036 by Gary Michaelis, RN Outcome: Progressing   Problem: Education: Goal: Knowledge of General Education information will improve Description: Including pain rating scale, medication(s)/side effects and non-pharmacologic comfort measures 03/11/2024 1720 by Gary Michaelis, RN Outcome: Progressing 03/11/2024 1036 by Gary Michaelis, RN Outcome: Progressing   Problem: Health Behavior/Discharge Planning: Goal: Ability to manage health-related needs will improve 03/11/2024 1720 by Gary Michaelis, RN Outcome: Progressing 03/11/2024 1036 by Gary Michaelis, RN Outcome: Progressing   Problem: Clinical Measurements: Goal: Ability to maintain clinical measurements within normal limits will improve 03/11/2024 1720 by Gary Michaelis, RN Outcome: Progressing 03/11/2024 1036 by Gary Michaelis, RN Outcome: Progressing Goal: Will remain free from infection 03/11/2024 1720 by Gary Michaelis,  RN Outcome: Progressing 03/11/2024 1036 by Gary Michaelis, RN Outcome: Progressing Goal: Diagnostic test results will improve 03/11/2024 1720 by Gary Michaelis, RN Outcome: Progressing 03/11/2024 1036 by Gary Michaelis, RN Outcome: Progressing Goal: Respiratory complications will improve 03/11/2024 1720 by Gary Michaelis, RN Outcome: Progressing 03/11/2024 1036 by Gary Michaelis, RN Outcome: Progressing Goal: Cardiovascular complication will be avoided 03/11/2024 1720 by Gary Michaelis, RN Outcome: Progressing 03/11/2024 1036 by Gary Michaelis, RN Outcome: Progressing   Problem: Activity: Goal: Risk for activity intolerance will decrease 03/11/2024 1720 by Gary Michaelis, RN Outcome: Progressing 03/11/2024 1036 by Gary Michaelis, RN Outcome: Progressing   Problem: Nutrition: Goal: Adequate nutrition will be maintained 03/11/2024 1720 by Gary Michaelis, RN Outcome: Progressing 03/11/2024 1036 by Gary Michaelis, RN Outcome: Progressing   Problem: Coping: Goal: Level of anxiety will decrease 03/11/2024 1720 by Gary Michaelis, RN Outcome: Progressing 03/11/2024 1036 by Gary Michaelis, RN Outcome: Progressing   Problem: Elimination: Goal: Will not experience complications related to bowel motility 03/11/2024 1720 by Gary Michaelis, RN Outcome: Progressing 03/11/2024 1036 by Gary Michaelis, RN Outcome: Progressing Goal: Will not experience complications related to urinary retention 03/11/2024 1720 by Gary Michaelis, RN Outcome: Progressing 03/11/2024 1036 by Gary Michaelis, RN Outcome: Progressing   Problem: Pain Managment: Goal: General experience of comfort will improve and/or be controlled 03/11/2024 1720 by Gary Michaelis, RN Outcome: Progressing 03/11/2024 1036 by Gary Michaelis, RN Outcome: Progressing   Problem: Safety: Goal: Ability to remain free from injury will improve 03/11/2024 1720 by Gary Michaelis, RN Outcome:  Progressing 03/11/2024 1036 by Gary Michaelis, RN Outcome: Progressing   Problem: Skin Integrity: Goal: Risk for impaired skin integrity will decrease 03/11/2024 1720 by Gary Michaelis, RN Outcome: Progressing 03/11/2024 1036 by Gary Michaelis, RN Outcome: Progressing   "

## 2024-03-11 NOTE — Progress Notes (Signed)
 Physical Therapy Treatment Patient Details Name: Erin Riley MRN: [id number] DOB: 1950/07/27 Today's Date: 03/11/2024   History of Present Illness Pt is a 74 y/o F admitted 03/01/24 after being found down by her son with left sided weakness, slurred speech and facial droop, also intermittent episodes of apnea. 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 She was intubated 12/27-28/25. PMH includes HTN, hypertrophic cardiomyopathy, PAD, CKD III, HLD, schizophrenia, AAA.    PT Comments  Pt resting in bed on arrival, agreeable to session however limited by increased fatigue this session. Pt keeping eyes closed throughout session, however with fair initiation of mobility to EOB. Pt coming to L sidelying with CGA (per pt request), and requiring max A to elevate trunk to sitting. Pt requiring max A to maintain sitting balance this session due to L lateral lean, and returning self to sidelying prior to initiation of transfer training. Pt continues to benefit from skilled PT services to progress toward functional mobility goals.     If plan is discharge home, recommend the following: Two people to help with walking and/or transfers;Two people to help with bathing/dressing/bathroom;Assistance with cooking/housework;Assistance with feeding;Direct supervision/assist for medications management;Assist for transportation;Direct supervision/assist for financial management;Supervision due to cognitive status   Can travel by Doctor, Hospital (measurements PT);Wheelchair cushion (measurements PT);Hospital bed;Hoyer lift    Recommendations for Other Services       Precautions / Restrictions Precautions Precautions: Fall Recall of Precautions/Restrictions: Impaired Precaution/Restrictions Comments: Goal SBP 100-140, L inattention, R gaze preference,  cortrak Restrictions Weight Bearing Restrictions Per Provider Order: No     Mobility  Bed Mobility Overal bed mobility: Needs Assistance Bed Mobility: Sidelying to Sit, Sit to Sidelying   Sidelying to sit: Max assist, HOB elevated     Sit to sidelying: Min assist, HOB elevated General bed mobility comments: pt exiting L EOB this session, able to bring LLE to and off EOB needing assist for RLE to follow, max A to elevate trunk from L sidelying, pt returning self to sidelying and brining RLE up into bed, asssit for LLE    Transfers Overall transfer level: Needs assistance                 General transfer comment: pt returning self to sidelying prior to attempt    Ambulation/Gait               General Gait Details: unable at this time   Stairs             Wheelchair Mobility     Tilt Bed    Modified Rankin (Stroke Patients Only) Modified Rankin (Stroke Patients Only) Pre-Morbid Rankin Score: No symptoms Modified Rankin: Severe disability     Balance Overall balance assessment: Needs assistance Sitting-balance support: Single extremity supported, Feet supported Sitting balance-Leahy Scale: Fair Sitting balance - Comments: able to sit EOB with S and A For L hand placement in lap; pt refusing activities in sitting including eating pudding, placing lotion on her extremities, etc                                    Communication Communication Communication: Impaired Factors Affecting Communication: Difficulty expressing self;Reduced clarity of speech  Cognition Arousal: Lethargic Behavior During Therapy: Restless   PT - Cognitive impairments:  Awareness, Memory, Attention, Initiation, Sequencing, Problem solving, Safety/Judgement                       PT - Cognition Comments: more lethargic this session, keeping eyes closed throughout but initiating mobility Following commands: Impaired Following commands impaired: Follows  one step commands inconsistently    Cueing Cueing Techniques: Verbal cues, Gestural cues, Tactile cues, Visual cues  Exercises      General Comments        Pertinent Vitals/Pain Pain Assessment Pain Assessment: Faces Faces Pain Scale: Hurts a little bit Pain Location: generalized with mobility Pain Descriptors / Indicators: Guarding, Grimacing, Moaning Pain Intervention(s): Monitored during session, Limited activity within patient's tolerance    Home Living                          Prior Function            PT Goals (current goals can now be found in the care plan section) Acute Rehab PT Goals Patient Stated Goal: none stated this session PT Goal Formulation: With patient/family Time For Goal Achievement: 03/17/24 Progress towards PT goals: Progressing toward goals    Frequency    Min 3X/week      PT Plan      Co-evaluation              AM-PAC PT 6 Clicks Mobility   Outcome Measure  Help needed turning from your back to your side while in a flat bed without using bedrails?: A Lot Help needed moving from lying on your back to sitting on the side of a flat bed without using bedrails?: A Lot Help needed moving to and from a bed to a chair (including a wheelchair)?: Total Help needed standing up from a chair using your arms (e.g., wheelchair or bedside chair)?: Total Help needed to walk in hospital room?: Total Help needed climbing 3-5 steps with a railing? : Total 6 Click Score: 8    End of Session   Activity Tolerance: Patient limited by fatigue;Patient limited by lethargy Patient left: in bed;with call bell/phone within reach;with bed alarm set Nurse Communication: Mobility status PT Visit Diagnosis: Other abnormalities of gait and mobility (R26.89);Muscle weakness (generalized) (M62.81);Other symptoms and signs involving the nervous system (R29.898);Hemiplegia and hemiparesis Hemiplegia - Right/Left: Left Hemiplegia -  dominant/non-dominant: Non-dominant Hemiplegia - caused by: Cerebral infarction     Time: 8483-8468 PT Time Calculation (min) (ACUTE ONLY): 15 min  Charges:    $Therapeutic Activity: 8-22 mins PT General Charges $$ ACUTE PT VISIT: 1 Visit                     Erin Donn R. PTA Acute Rehabilitation Services Office: [phone]   Erin Riley 03/11/2024, 4:08 PM

## 2024-03-11 NOTE — Progress Notes (Addendum)
 " Progress Note    Erin Riley   FMW:[id number]  DOB: May 07, 1950  DOA: 03/01/2024     10 PCP: [name], MD  Initial CC: Found down at home  Hospital Course: Ms. Winstanley is a 74 yo female with PMH CAD, CKD, HLD, HTN, PAD, schizophrenia, severe AS who was found on the floor at home.  She had dense left-sided weakness, slurred speech, facial droop.  She required intubation upon arrival to the ER.  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  MRI brain showed acute right MCA territory infarct with 2 mm leftward midline shift.  She was able to be weaned off pressors and extubated.  Also evaluated by vascular surgery.   Assessment and Plan    Subacute R MCA CVA  -Presented outside of window for intervention. Etiology felt most likely cardioembolic  - Appreciate stroke team's management.  Agree that stroke was likely cardioembolic, although no LV thrombus seen on echo -SBP goal is complex:  enlarging aortic aneurysms, hx AS -- need to avoid significant HTN with rupture risk. Goal SBP 100-140  -cortrak; continue TF for now; SLP will continue following - Unable to perform MBS on 1/5 due to her agitation and unable to follow prompts; will discuss with family about placing PEG tube at this point as she is likely to need and if does happen to progress in the future, can be removed; do not think family wants to be anything other than aggressive at this point - potential CIR candidate; has significant dense left hemiplegia with neglect; if declined, will need SNF pursuit after PEG placement  - Continue aspirin , Eliquis , statin - asa on hold 1/6 - 1/12 in anticipation of PEG on 1/12, then resume asa 1/13 - continue Eliquis  thru 1/9; hold 48 hrs for PEG on 1/12  and resume on 1/13 - IR requesting repeat SLP eval prior to PEG. Low suspicion that mentation will allow to participate in MBS. 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  -not a candidate for open or endovascular repair at this time. See VVS note 12/28 for details   Hx NICM  HFrEF -- LVEF 25-30%  Severe AS -not a TAVR candidate  -GDMT limited at this time w renal dysfxn    AKI on CKD3b, improving  Elevated CK, improving  - dc foley  - Renal function improving and back to baseline   DM2 w hyperglycemia  -SSI   Leukocytosis + low grade temp -some upper lobe rhonchi, plausible aspirations  -MRSA PCR neg -has a foley - Augmentin  course completed  Interval History:  Awake in her usual state.  Difficulty with following commands some and still with severe dysarthria. Continues to have dense hemiplegia of left upper extremity.  Antimicrobials:   DVT prophylaxis:  SCDs Start: 03/01/24 1745 apixaban  (ELIQUIS ) tablet 5 mg   Code Status:   Code Status: Full Code  Mobility Assessment (Last 72 Hours)     Mobility Assessment  Row Name 03/11/24 0800 03/10/24 2200 03/10/24 1500 03/10/24 0800 03/09/24 2000   Does the patient have exclusion criteria? No- Perform mobility assessment No- Perform mobility assessment -- No- Perform mobility assessment No- Perform mobility assessment   What is the highest level of mobility based on the mobility assessment? Level 1 (Bedfast) - Unable to balance while sitting on edge of bed Level 1 (Bedfast) - Unable to balance while sitting on  edge of bed Level 1 (Bedfast) - Unable to balance while sitting on edge of bed Level 1 (Bedfast) - Unable to balance while sitting on edge of bed Level 1 (Bedfast) - Unable to balance while sitting on edge of bed   Is the above level different from baseline mobility prior to current illness? Yes - Recommend PT order Yes - Recommend PT order -- Yes - Recommend PT order Yes - Recommend PT order    Row Name 03/09/24 0732 03/08/24 2015         Does the patient have exclusion criteria? No- Perform mobility assessment No- Perform mobility assessment      What is the highest level of mobility based on the mobility assessment? Level 1 (Bedfast) - Unable to balance while sitting on edge of bed Level 1 (Bedfast) - Unable to balance while sitting on edge of bed      Is the above level different from baseline mobility prior to current illness? Yes - Recommend PT order Yes - Recommend PT order         Diet: Diet Orders (From admission, onward)     Start     Ordered   03/03/24 1231  Diet NPO time specified Except for: Other (See Comments)  Diet effective now       Comments: Okay for puree snacks from RN floor including applesauce, pudding, and yogurt  Question:  Except for  Answer:  Other (See Comments)   03/03/24 1230            Barriers to discharge: None Disposition Plan: CIR versus SNF HH orders placed: TBD Status is: Inpatient  Objective: Blood pressure 124/68, pulse 80, temperature 98.1 F (36.7 C), temperature source Axillary, resp. rate 20, height 5' 7 (1.702 m), weight 95.8 kg, last menstrual period 05/10/2013, SpO2 97%.  Examination:  Physical Exam Constitutional:      Appearance: Normal appearance.  HENT:     Head: Normocephalic and atraumatic.     Comments: Cortrak in place    Mouth/Throat:     Mouth: Mucous membranes are moist.  Eyes:     Extraocular Movements: Extraocular movements intact.  Cardiovascular:     Rate and Rhythm: Normal rate and regular rhythm.   Pulmonary:     Effort: Pulmonary effort is normal. No respiratory distress.     Breath sounds: Normal breath sounds. No wheezing.  Abdominal:     General: Bowel sounds are normal. There is no distension.     Palpations: Abdomen is soft.     Tenderness: There is no abdominal tenderness.  Musculoskeletal:        General: Normal range of motion.     Cervical back: Normal range of motion and neck supple.  Skin:    General: Skin is warm and dry.  Neurological:     Mental Status: She is alert.     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  Able to move left lower extremity a little bit to command but still some neglect with LLE as well.  Severe  dysarthria.  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      Consultants:  Neurology Vascular surgery Pulmonology  Procedures:    Data Reviewed: Results for orders placed or performed during the hospital encounter of 03/01/24 (from the past 24 hours)  Glucose, capillary     Status: Abnormal   Collection Time: 03/10/24  4:09 PM  Result Value Ref Range   Glucose-Capillary 113 (H) 70 - 99 mg/dL  Glucose, capillary     Status: Abnormal   Collection Time: 03/10/24  7:42 PM  Result Value Ref Range   Glucose-Capillary 117 (H) 70 - 99 mg/dL   Comment 1 Notify RN    Comment 2 Document in Chart   Glucose, capillary     Status: None   Collection Time: 03/10/24 11:57 PM  Result Value Ref Range   Glucose-Capillary 93 70 - 99 mg/dL   Comment 1 Notify RN    Comment 2 Document in Chart   CBC     Status: Abnormal   Collection Time: 03/11/24  3:22 AM  Result Value Ref Range   WBC 10.2 4.0 - 10.5 K/uL   RBC 3.82 (L) 3.87 - 5.11 MIL/uL   Hemoglobin 10.7 (L) 12.0 - 15.0 g/dL   HCT 65.1 (L) 63.9 - 53.9 %   MCV 91.1 80.0 - 100.0 fL   MCH 28.0 26.0 - 34.0 pg   MCHC 30.7 30.0 - 36.0 g/dL   RDW 85.1 88.4 - 84.4 %   Platelets 484 (H) 150 - 400 K/uL   nRBC 0.0  0.0 - 0.2 %  Glucose, capillary     Status: None   Collection Time: 03/11/24  4:08 AM  Result Value Ref Range   Glucose-Capillary 96 70 - 99 mg/dL   Comment 1 Notify RN    Comment 2 Document in Chart   Glucose, capillary     Status: Abnormal   Collection Time: 03/11/24  8:55 AM  Result Value Ref Range   Glucose-Capillary 134 (H) 70 - 99 mg/dL  Glucose, capillary     Status: Abnormal   Collection Time: 03/11/24 11:40 AM  Result Value Ref Range   Glucose-Capillary 117 (H) 70 - 99 mg/dL    I have reviewed pertinent nursing notes, vitals, labs, and images as necessary. I have ordered labwork to follow up on as indicated.  I have reviewed the last notes from staff over past 24 hours. I have discussed patient's care plan and test results with nursing staff, CM/SW, and other staff as appropriate.  Old records reviewed in assessment of this patient    LOS: 10 days   Alm Apo, MD [facility] 03/11/2024, 12:16 PM "

## 2024-03-11 NOTE — Consult Note (Signed)
 "     Chief Complaint: Nutritional Access. Request is for gastrostomy tube placement  Referring Physician(s): Dr. Alm Boxer  Supervising Physician: Jenna Hacker  Patient Status: [facility] - In-pt  History of Present Illness: Erin Riley is a 74 y.o. female 74 y.o. female inpatient. History of schizophrenia, PAD, cardiomyopathy, CAD, CK, HTN, HLD. Presented to the ED at [facility] on 03/01/24 after being found down.  Found to have  left sided hemiparesis, left sided facial droop and a compromised airway requiring intubation. 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 Currently extubated. Team is requesting a gastrostomy tube placement for nutritional access. Case reviewed and approved by IR Attending Dr. Ester Sides  Currently without any significant complaints. Patient arouses when prompted but quickly falls back asleep. Little participation in exam/conversation past pleasantries.    Last dose of ASA 81 mg and Eliquis  on 03/11/24, aspirin  now on hold per hospital team for tube placement.     Past Medical History:  Diagnosis Date   Chronic back pain    Chronic kidney disease (CKD), stage II (mild)    Class I-II   Coronary artery disease 04/2009   50% stenosis in the perforator of LAD; catheterization was for an abnormal Myoview in January 2000 showing anterior and inferolateral ischemia.   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 > 3 subsequent Echos show improved/normal EF with moderate to severe concentric LVH and diastolic dysfunction with LVOT/intracavitary gradient --> 06/2016: Severe LVH.  Vigorous EF, 65-70%.?? Gr 1 DD. Mild AS.   Hyperlipidemia    Hypertension    Hypertensive hypertrophic cardiomyopathy: NYHA class II:  Echo: Severe concentric LVH with LV OT gradient; essentially preserved EF with diastolic dysfunction 02/15/2013   Echo  06/2016: Severe Concentric LVH. Vigorous EF 65-70%. ~ Gr I DD.    Mild aortic stenosis by prior echocardiography    Echo 06/2016: Mild AS (Mean Gradient 15 mmHg); has had prior Mod-Severe MR (not seen on current echo)   PAD (peripheral artery disease) March 2013   Lower extremity Dopplers: R. SFA 50-60%, R. PTA proximally occluded with distal reconstitution;; L. common iliac ~50%, L. SFA 50-70% stenosis, L. PTA < 50%   Schizophrenia (HCC)     Past Surgical History:  Procedure Laterality Date   BUNIONECTOMY     carotid doppler  05/29/2011   left bulb/prox ICA moderate amtfibrous plaque with no evidence significant reduction.,right bulb /proximal ICA normal patency   lower extremity doppler  05/29/2011   right SFA 50% to 59% diameter reduction,right posterior tibal atreery occlusive disease,reconstituting distally, left common illiac<50%,left SFA 50 to70%,left post. tibial <50%   NM MYOCAR PERF WALL MOTION  03/2009   Persantine; EF 51%-both anterior and inferolateral ischemia   TRANSTHORACIC ECHOCARDIOGRAM  06/2016   Severe LVH.  Vigorous EF of 65-70%.  No RWMA. ~Only grade 1 diastolic dysfunction.  Mild aortic stenosis (mean gradient 15 mmHg)   TRANSTHORACIC ECHOCARDIOGRAM  07/2012   EF 50-55%; severe concentric LVH; only grade 1 diastolic dysfunction. Mild aortic sclerosis - with LVOT /intracavitary gradient of roughly 20 mmHg mean. Mild to moderately dilated LA;; previously reported MR not seen    Allergies: Patient has no known allergies.  Medications: Prior to Admission medications  Medication Sig Start Date End Date Taking? Authorizing Provider  amLODipine  (NORVASC ) 10 MG tablet Take 1 tablet (10 mg total)  by mouth daily. 12/28/23  Yes Donnelly Mellow, MD  aspirin  EC 81 MG tablet Take 1 tablet (81 mg total) by mouth daily. Swallow whole. 08/02/23  Yes Milford, Harlene [name], FNP  atorvastatin  (LIPITOR ) 40 MG tablet Take 1 tablet (40 mg total) by mouth at bedtime. 08/02/23  Yes Milford,  Harlene [name], FNP  carvedilol  (COREG ) 3.125 MG tablet Take 1 tablet (3.125 mg total) by mouth 2 (two) times daily with a meal. 08/02/23  Yes Milford, Harlene [name], FNP  empagliflozin  (JARDIANCE ) 10 MG TABS tablet Take 1 tablet (10 mg total) by mouth daily. 08/02/23  Yes Milford, Harlene [name], FNP  ferrous sulfate  325 (65 FE) MG tablet Take 1 tablet (325 mg total) by mouth daily with breakfast. 09/14/22  Yes Singh, Prashant K, MD  furosemide  (LASIX ) 40 MG tablet Take 1 tablet (40 mg total) by mouth 2 (two) times daily. 12/28/23  Yes Jadapalle, Sree, MD  OLANZapine  (ZYPREXA ) 7.5 MG tablet Take 1 tablet (7.5 mg total) by mouth at bedtime. 12/28/23  Yes Jadapalle, Sree, MD  potassium chloride  (KLOR-CON  M) 10 MEQ tablet Take 3 tablets (30 mEq total) by mouth daily. 08/21/23  Yes Marcine Catalan M, PA-C  sacubitril -valsartan  (ENTRESTO ) 49-51 MG Take 1 tablet by mouth 2 (two) times daily. 12/17/23  Yes Lee, Jordan, NP     Family History  Problem Relation Age of Onset   Hypertension Mother    Breast cancer Neg Hx     Social History   Socioeconomic History   Marital status: Single    Spouse name: Not on file   Number of children: 7   Years of education: Not on file   Highest education level: Not on file  Occupational History   Not on file  Tobacco Use   Smoking status: Some Days    Current packs/day: 0.00    Types: Cigarettes    Last attempt to quit: 05/14/2002    Years since quitting: 21.8   Smokeless tobacco: Never  Vaping Use   Vaping status: Never Used  Substance and Sexual Activity   Alcohol use: No   Drug use: No   Sexual activity: Yes    Birth control/protection: Post-menopausal, None  Other Topics Concern   Not on file  Social History Narrative   Now single mother of 2 with one grandchild. She quit smoking roughly 5 years ago and is not so since. She has also stopped drinking alcohol. She does try get routine exercise walking at least a mile 3-4 days a week.    She lives with her  80 year old mother. 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   Social Drivers of Health   Tobacco Use: High Risk (03/01/2024)   Patient History    Smoking Tobacco Use: Some Days    Smokeless Tobacco Use: Never    Passive Exposure: Not on file  Financial Resource Strain: Not on file  Food Insecurity: No Food Insecurity (03/02/2024)   Epic    Worried About Programme Researcher, Broadcasting/film/video in the Last Year: Never true    Ran Out of Food in the Last Year: Never true  Transportation Needs: No Transportation Needs (03/02/2024)   Epic    Lack of Transportation (Medical): No    Lack of Transportation (Non-Medical): No  Physical Activity: Not on file  Stress: Not on file  Social Connections: Unknown (03/02/2024)   Social Connection and Isolation Panel    Frequency of Communication with Friends and Family: Three times a week    Frequency of  Social Gatherings with Friends and Family: Twice a week    Attends Religious Services: 1 to 4 times per year    Active Member of [employer] or Organizations: No    Attends Banker Meetings: Never    Marital Status: Not on file  Depression (PHQ2-9): Not on file  Alcohol Screen: Low Risk (12/20/2023)   Alcohol Screen    Last Alcohol Screening Score (AUDIT): 0  Housing: Low Risk (03/02/2024)   Epic    Unable to Pay for Housing in the Last Year: No    Number of Times Moved in the Last Year: 0    Homeless in the Last Year: No  Utilities: Not At Risk (03/02/2024)   Epic    Threatened with loss of utilities: No  Health Literacy: Not on file    Review of Systems: A 12 point ROS discussed and pertinent positives are indicated in the HPI above.  All other systems are negative.  Review of Systems  Unable to perform ROS: Mental status change    Vital Signs: BP 124/68 (BP Location: Left Arm)   Pulse 80   Temp 98.1 F (36.7 C) (Axillary)   Resp 20   Ht 5' 7 (1.702 m)   Wt 211 lb 3.6 oz (95.8 kg)   LMP 05/10/2013   SpO2 97%   BMI 33.08 kg/m    Physical  Exam Vitals and nursing note reviewed.  Constitutional:      General: She is not in acute distress.    Appearance: Normal appearance. She is not ill-appearing.  HENT:     Nose:     Comments: NGT in place Cardiovascular:     Rate and Rhythm: Normal rate.  Pulmonary:     Effort: Pulmonary effort is normal.  Abdominal:     Palpations: Abdomen is soft.  Skin:    General: Skin is warm and dry.  Neurological:     General: No focal deficit present.     Mental Status: She is alert and oriented to person, place, and time. Mental status is at baseline.  Psychiatric:        Mood and Affect: Mood normal.        Behavior: Behavior normal.        Thought Content: Thought content normal.        Judgment: Judgment normal.     Imaging: VAS US  CAROTID Result Date: 03/04/2024 Carotid Arterial Duplex Study Patient Name:  Erin Riley  Date of Exam:   03/03/2024 Medical Rec #: [id number]    Accession #:    [id number] Date of Birth: August 28, 1950    Patient Gender: F Patient Age:   19 years Exam Location:  [facility] Procedure:      VAS US  CAROTID Referring Phys: MIMI [place] --------------------------------------------------------------------------------  Indications:       CVA. Risk Factors:      Hypertension, hyperlipidemia, current smoker, coronary artery                    disease, PAD. Other Factors:     CKD, CHF, aortic stenosis, thoracoabdominal aortic aneurysm. Comparison Study:  Previous exam was on 12/20/2017 showed bilateral 1-39% ICA                    stenosis. Performing Technologist: Ezzie Potters RVT, RDMS  Examination Guidelines: A complete evaluation includes B-mode imaging, spectral Doppler, color Doppler, and power Doppler as needed of all accessible portions of each vessel. Bilateral  testing is considered an integral part of a complete examination. Limited examinations for reoccurring indications may be performed as noted.  Right Carotid Findings:  +----------+--------+--------+--------+------------------+------------------+           PSV cm/sEDV cm/sStenosisPlaque DescriptionComments           +----------+--------+--------+--------+------------------+------------------+ CCA Prox  35      12                                intimal thickening +----------+--------+--------+--------+------------------+------------------+ CCA Distal36      12              heterogenous      intimal thickening +----------+--------+--------+--------+------------------+------------------+ ICA Prox  30      13      1-39%   calcific                             +----------+--------+--------+--------+------------------+------------------+ ICA Distal63      29                                                   +----------+--------+--------+--------+------------------+------------------+ ECA       69      16                                                   +----------+--------+--------+--------+------------------+------------------+ +----------+--------+-------+---------+-------------------+           PSV cm/sEDV cmsDescribe Arm Pressure (mmHG) +----------+--------+-------+---------+-------------------+ Subclavian60             Turbulent                    +----------+--------+-------+---------+-------------------+ +---------+--------+--+--------+--+---------+ VertebralPSV cm/s23EDV cm/s10Antegrade +---------+--------+--+--------+--+---------+  Left Carotid Findings: +----------+--------+--------+--------+-------------------------+--------+           PSV cm/sEDV cm/sStenosisPlaque Description       Comments +----------+--------+--------+--------+-------------------------+--------+ CCA Prox  45      15              heterogenous and smooth           +----------+--------+--------+--------+-------------------------+--------+ CCA Distal51      15              heterogenous and smooth            +----------+--------+--------+--------+-------------------------+--------+ ICA Prox  57      26      1-39%   heterogenous and calcific         +----------+--------+--------+--------+-------------------------+--------+ ICA Distal50      24                                                +----------+--------+--------+--------+-------------------------+--------+ ECA       53      10                                                +----------+--------+--------+--------+-------------------------+--------+ +----------+--------+--------+----------------+-------------------+  PSV cm/sEDV cm/sDescribe        Arm Pressure (mmHG) +----------+--------+--------+----------------+-------------------+ [id number]              Multiphasic, WNL                    +----------+--------+--------+----------------+-------------------+ +---------+--------+--+--------+-+---------+ VertebralPSV cm/s24EDV cm/s7Antegrade +---------+--------+--+--------+-+---------+   Summary: Right Carotid: Velocities in the right ICA are consistent with a 1-39% stenosis. Left Carotid: Velocities in the left ICA are consistent with a 1-39% stenosis. Vertebrals:  Bilateral vertebral arteries demonstrate antegrade flow. Subclavians: Right subclavian artery flow was disturbed. Normal flow              hemodynamics were seen in the left subclavian artery. *See table(s) above for measurements and observations.  Electronically signed by Eather Popp MD on 03/04/2024 at 11:36:12 AM.    Final    DG Swallowing Func-Speech Pathology Result Date: 03/03/2024 Table formatting from the original result was not included. Modified Barium Swallow Study Patient Details Name: Erin Riley MRN: [id number] Date of Birth: 05/11/1950 Today's Date: 03/03/2024 HPI/PMH: HPI: Erin Riley is a 74 y.o. female who presented to the hospital from home on 03/01/24 after being found by her son with left sided weakness, slurred speech and facial  droop. She was intubated upon arrival to ER. CXR did not show acute abnormality, CT chest did not show any focal consolidation or PE. 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 She was extubated on 03/02/24 at 1305 to oxygen via [place]. PMH: HTN, severe aortic stenosis, nonischemic cardiomyopathy, CKD stage IIIb, schizophrenia. MBSS completed on 12/29 with recommendation to continue NPO with the exception of puree snacks. Clinical Impression: Pt presents with a moderate-severe oropharyngeal dysphagia per results of MBSS completed today. Recommend continue NPO with the exception of puree snacks from nursing unit including, applesauce, yogurt, or pudding. Ice chips are not recommended at this time due to oral deficits and aspiration risk.  Oral deficits characterized by poor oral and lingual strength and coordination, which resulted in anterior spillage, poor labial seal, prolonged oral transit, lingual pumping, and significant posterior spillage of liquids into the airway.  Pharyngeal deficits included reduced hyolaryngeal elevation/excursion, mistiming of epiglottic inversion, poor laryngeal vestibule closure (though may have been more of a timing issue), and reduced base of tongue retraction.  Findings: -There was frank aspiration of thin liquids, nectar-thick liquids, and honey-thick liquids before/during the swallow. Aspiration occurrences were inconsistent, though pt did aspirate large volumes when aspiration occurred. -Aspiration appeared to occur as a result of significant oral deficits + lack of vallecular space given that liquid trials spilled nearly directly into the airway before/during the swallow. -Pt exhibited an immediate or delayed cough response to majority of aspiration events with x1 event of silent aspiration of a small amount of honey-thick liquids. -Presenting smaller or larger volumes of liquids did not appear to reduce aspiration risk.   Plan: SLP will follow for ongoing dysphagia and cognitive/communication therapy. Recommend focusing on oral motor exercises and oral bolus control in effort to reduce pt's aspiration risk with PO intake and possibly advance diet. Pt will need a repeat MBSS prior to diet advancement. SLP can try puree solids and honey-thick liquids by spoon at bedside as these appeared to be the safest consistencies. Factors that may increase risk of adverse event in presence of aspiration Noe & Lianne 2021): Factors that may increase risk of adverse event in presence of aspiration Noe & Lianne 2021):  Aspiration of thick, dense, and/or acidic materials; Frequent aspiration of large volumes; Dependence for feeding and/or oral hygiene; Frail or deconditioned Recommendations/Plan: Swallowing Evaluation Recommendations Swallowing Evaluation Recommendations Recommendations: NPO (with exception of puree snacks from nursing floor) Liquid Administration via: Spoon Medication Administration: Crushed with puree Supervision: Full assist for feeding Swallowing strategies  : Slow rate; Small bites/sips; Minimize environmental distractions Postural changes: Stay upright 30-60 min after meals Oral care recommendations: Oral care QID (4x/day) Caregiver Recommendations: Avoid jello, ice cream, thin soups, popsicles; Remove water  pitcher; Have oral suction available Treatment Plan Treatment Plan Treatment recommendations: Therapy as outlined in treatment plan below Follow-up recommendations: Follow physicians's recommendations for discharge plan and follow up therapies Functional status assessment: Patient has had a recent decline in their functional status and demonstrates the ability to make significant improvements in function in a reasonable and predictable amount of time. Treatment frequency: Min 2x/week Treatment duration: 4 weeks Interventions: Diet toleration management by SLP; Patient/family education; Trials of upgraded  texture/liquids Recommendations Recommendations for follow up therapy are one component of a multi-disciplinary discharge planning process, led by the attending physician.  Recommendations may be updated based on patient status, additional functional criteria and insurance authorization. Assessment: Orofacial Exam: Orofacial Exam Oral Cavity - Dentition: Edentulous; Dentures, not available Orofacial Anatomy: -- (see clinical swallow assessmeny) Anatomy: No data recorded Boluses Administered: Boluses Administered Boluses Administered: Thin liquids (Level 0); Mildly thick liquids (Level 2, nectar thick); Moderately thick liquids (Level 3, honey thick); Puree  Oral Impairment Domain: Oral Impairment Domain Lip Closure: Escape progressing to mid-chin Tongue control during bolus hold: Posterior escape of less than half of bolus Bolus preparation/mastication: -- (not tested) Bolus transport/lingual motion: Repetitive/disorganized tongue motion Oral residue: Residue collection on oral structures Location of oral residue : Floor of mouth; Tongue Initiation of pharyngeal swallow : Valleculae  Pharyngeal Impairment Domain: Pharyngeal Impairment Domain Soft palate elevation: No bolus between soft palate (SP)/pharyngeal wall (PW) Laryngeal elevation: Partial superior movement of thyroid  cartilage/partial approximation of arytenoids to epiglottic petiole Anterior hyoid excursion: Partial anterior movement Epiglottic movement: Complete inversion Laryngeal vestibule closure: None, wide column air/contrast in laryngeal vestibule Pharyngeal stripping wave : Present - diminished Pharyngeal contraction (A/P view only): N/A Pharyngoesophageal segment opening: Complete distension and complete duration, no obstruction of flow Tongue base retraction: Narrow column of contrast or air between tongue base and PPW Pharyngeal residue: Complete pharyngeal clearance  Esophageal Impairment Domain: Esophageal Impairment Domain Esophageal clearance  upright position: -- (not assessed) Pill: No data recorded Penetration/Aspiration Scale Score: Penetration/Aspiration Scale Score 2.  Material enters airway, remains ABOVE vocal cords then ejected out: Puree; Mildly thick liquids (Level 2, nectar thick); Moderately thick liquids (Level 3, honey thick) 3.  Material enters airway, remains ABOVE vocal cords and not ejected out: Puree 5.  Material enters airway, CONTACTS cords and not ejected out: Moderately thick liquids (Level 3, honey thick) 7.  Material enters airway, passes BELOW cords and not ejected out despite cough attempt by patient: Thin liquids (Level 0); Mildly thick liquids (Level 2, nectar thick); Moderately thick liquids (Level 3, honey thick) 8.  Material enters airway, passes BELOW cords without attempt by patient to eject out (silent aspiration) : Moderately thick liquids (Level 3, honey thick) Compensatory Strategies: Compensatory Strategies Compensatory strategies: -- (unable to follow directions to attempt strategies)   General Information: Caregiver present: No  Diet Prior to this Study: NPO   Temperature : Febrile   No data recorded  Supplemental O2: Nasal cannula (1L)  History of Recent Intubation: Yes  Behavior/Cognition: Alert; Cooperative; Requires cueing Self-Feeding Abilities: Dependent for feeding No data recorded Volitional Cough: Unable to elicit Volitional Swallow: Unable to elicit Exam Limitations: Excessive movement Goal Planning: Prognosis for improved oropharyngeal function: Fair Barriers to Reach Goals: Severity of deficits; Cognitive deficits No data recorded Patient/Family Stated Goal: asking for water , ginger ale Consulted and agree with results and recommendations: Patient; Nurse Pain: Pain Assessment Pain Assessment: No/denies pain Facial Expression: 0 Body Movements: 0 Muscle Tension: 0 Compliance with ventilator (intubated pts.): 0 Vocalization (extubated pts.): 0 CPOT Total: 0 End of Session: Start Time:SLP Start Time  (ACUTE ONLY): 1035 Stop Time: SLP Stop Time (ACUTE ONLY): 1055 Time Calculation:SLP Time Calculation (min) (ACUTE ONLY): 20 min Charges: SLP Evaluations $ SLP Speech Visit: 1 Visit SLP Evaluations $BSS Swallow: 1 Procedure $MBS Swallow: 1 Procedure $ SLP EVAL LANGUAGE/SOUND PRODUCTION: 1 Procedure SLP visit diagnosis: SLP Visit Diagnosis: Dysarthria and anarthria (R47.1); Cognitive communication deficit (R41.841); Aphasia (R47.01) Past Medical History: Past Medical History: Diagnosis Date  Chronic back pain   Chronic kidney disease (CKD), stage II (mild)   Class I-II  Coronary artery disease 04/2009  50% stenosis in the perforator of LAD; catheterization was for an abnormal Myoview in January 2000 showing anterior and inferolateral ischemia.  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 > 3 subsequent Echos show improved/normal EF with moderate to severe concentric LVH and diastolic dysfunction with LVOT/intracavitary gradient --> 06/2016: Severe LVH.  Vigorous EF, 65-70%.?? Gr 1 DD. Mild AS.  Hyperlipidemia   Hypertension   Hypertensive hypertrophic cardiomyopathy: NYHA class II:  Echo: Severe concentric LVH with LV OT gradient; essentially preserved EF with diastolic dysfunction 02/15/2013  Echo 06/2016: Severe Concentric LVH. Vigorous EF 65-70%. ~ Gr I DD.   Mild aortic stenosis by prior echocardiography   Echo 06/2016: Mild AS (Mean Gradient 15 mmHg); has had prior Mod-Severe MR (not seen on current echo)  PAD (peripheral artery disease) March 2013  Lower extremity Dopplers: R. SFA 50-60%, R. PTA proximally occluded with distal reconstitution;; L. common iliac ~50%, L. SFA 50-70% stenosis, L. PTA < 50%  Schizophrenia (HCC)  Past Surgical History: Past Surgical History: Procedure Laterality Date  BUNIONECTOMY    carotid doppler  05/29/2011  left bulb/prox ICA moderate amtfibrous plaque with no evidence  significant reduction.,right bulb /proximal ICA normal patency  lower extremity doppler  05/29/2011  right SFA 50% to 59% diameter reduction,right posterior tibal atreery occlusive disease,reconstituting distally, left common illiac<50%,left SFA 50 to70%,left post. tibial <50%  NM MYOCAR PERF WALL MOTION  03/2009  Persantine; EF 51%-both anterior and inferolateral ischemia  TRANSTHORACIC ECHOCARDIOGRAM  06/2016  Severe LVH.  Vigorous EF of 65-70%.  No RWMA. ~Only grade 1 diastolic dysfunction.  Mild aortic stenosis (mean gradient 15 mmHg)  TRANSTHORACIC ECHOCARDIOGRAM  07/2012  EF 50-55%; severe concentric LVH; only grade 1 diastolic dysfunction. Mild aortic sclerosis - with LVOT /intracavitary gradient of roughly 20 mmHg mean. Mild to moderately dilated LA;; previously reported MR not seen Erin Riley 03/03/2024, 12:50 PM  ECHOCARDIOGRAM COMPLETE Result Date: 03/02/2024    ECHOCARDIOGRAM REPORT   Patient Name:   Erin Riley Date of Exam: 03/02/2024 Medical Rec #:  [id number]   Height:       67.0 in Accession #:    [id number]  Weight:       214.5 lb Date of Birth:  10/23/1950   BSA:          2.083 m Patient Age:    73 years    BP:           131/93 mmHg Patient Gender: F           HR:           100 bpm. Exam Location:  Inpatient Procedure: 2D Echo, Cardiac Doppler, Color Doppler and Intracardiac            Opacification Agent (Both Spectral and Color Flow Doppler were            utilized during procedure). Indications:    Stroke 163.9  History:        Patient has prior history of Echocardiogram examinations, most                 recent 06/13/2023. CHF, CAD, Stroke and PAD; Risk                 Factors:Dyslipidemia, Hypertension and Current Smoker.  Sonographer:    Merlynn Argyle Referring Phys: [id number] [facility] ORN [facility]  Sonographer Comments: Technically difficult study due to poor echo windows and suboptimal subcostal window. Image acquisition challenging due to uncooperative patient and Image acquisition  challenging due to respiratory motion. IMPRESSIONS  1. Left ventricular ejection fraction, by estimation, is 25 to 30%. The left ventricle has severely decreased function. The left ventricle demonstrates global hypokinesis. The left ventricular internal cavity size was mildly dilated. There is severe concentric left ventricular hypertrophy. Left ventricular diastolic parameters are indeterminate.  2. Right ventricular systolic function is normal. The right ventricular size is normal.  3. Left atrial size was severely dilated.  4. Right atrial size was moderately dilated.  5. The mitral valve is normal in structure. No evidence of mitral valve regurgitation. No evidence of mitral stenosis.  6. The aortic valve is calcified. There is moderate calcification of the aortic valve. There is moderate thickening of the aortic valve. Aortic valve regurgitation is trivial. Severe aortic valve stenosis. Aortic valve area, by VTI measures 0.81 cm. Aortic valve mean gradient measures 33.0 mmHg. Aortic valve Vmax measures 3.82 m/s.  7. Aortic dilatation noted. There is mild dilatation of the ascending aorta, measuring 41 mm.  8. The inferior vena cava is normal in size with greater than 50% respiratory variability, suggesting right atrial pressure of 3 mmHg. Conclusion(s)/Recommendation(s): No intracardiac source of embolism detected on this transthoracic study. Consider a transesophageal echocardiogram to exclude cardiac source of embolism if clinically indicated. FINDINGS  Left Ventricle: Left ventricular ejection fraction, by estimation, is 25 to 30%. The left ventricle has severely decreased function. The left ventricle demonstrates global hypokinesis. Definity  contrast agent was given IV to delineate the left ventricular endocardial borders. The left ventricular internal cavity size was mildly dilated. There is severe concentric left ventricular hypertrophy. Left ventricular diastolic parameters are indeterminate. Right  Ventricle: The right ventricular size is normal. No increase in right ventricular wall thickness. Right ventricular systolic function is normal. Left Atrium: Left atrial size was severely dilated. Right Atrium: Right atrial size was moderately dilated. Pericardium: There is no evidence of pericardial effusion. Mitral Valve: The mitral valve is normal in structure. No evidence of mitral valve regurgitation. No evidence of mitral valve stenosis. Tricuspid Valve: The tricuspid valve is normal in structure. Tricuspid valve regurgitation is not demonstrated. No evidence of tricuspid stenosis. Aortic Valve: The aortic valve is calcified. There is moderate calcification of the aortic  valve. There is moderate thickening of the aortic valve. Aortic valve regurgitation is trivial. Aortic regurgitation PHT measures 425 msec. Severe aortic stenosis is present. Aortic valve mean gradient measures 33.0 mmHg. Aortic valve peak gradient measures 58.2 mmHg. Aortic valve area, by VTI measures 0.81 cm. Pulmonic Valve: The pulmonic valve was normal in structure. Pulmonic valve regurgitation is not visualized. No evidence of pulmonic stenosis. Aorta: Aortic dilatation noted. There is mild dilatation of the ascending aorta, measuring 41 mm. Venous: The inferior vena cava is normal in size with greater than 50% respiratory variability, suggesting right atrial pressure of 3 mmHg. IAS/Shunts: No atrial level shunt detected by color flow Doppler.  LEFT VENTRICLE PLAX 2D LVIDd:         6.80 cm   Diastology LVIDs:         6.10 cm   LV e' lateral:   7.77 cm/s LV PW:         1.30 cm   LV E/e' lateral: 14.8 LV IVS:        1.20 cm LVOT diam:     2.00 cm LV SV:         54 LV SV Index:   26 LVOT Area:     3.14 cm  RIGHT VENTRICLE             IVC RV S prime:     16.20 cm/s  IVC diam: 2.55 cm TAPSE (M-mode): 2.8 cm LEFT ATRIUM             Index        RIGHT ATRIUM           Index LA diam:        6.10 cm 2.93 cm/m   RA Area:     12.80 cm LA Vol  (A2C):   72.8 ml 34.94 ml/m  RA Volume:   29.60 ml  14.21 ml/m LA Vol (A4C):   40.3 ml 19.34 ml/m LA Biplane Vol: 54.5 ml 26.16 ml/m  AORTIC VALVE                     PULMONIC VALVE AV Area (Vmax):    0.78 cm      PR End Diast Vel: 9.86 msec AV Area (Vmean):   0.78 cm AV Area (VTI):     0.81 cm AV Vmax:           381.50 cm/s AV Vmean:          271.500 cm/s AV VTI:            0.666 m AV Peak Grad:      58.2 mmHg AV Mean Grad:      33.0 mmHg LVOT Vmax:         95.03 cm/s LVOT Vmean:        67.533 cm/s LVOT VTI:          0.171 m LVOT/AV VTI ratio: 0.26 AI PHT:            425 msec  AORTA Ao Root diam: 3.30 cm Ao Asc diam:  4.10 cm MITRAL VALVE                TRICUSPID VALVE MV Area (PHT): 3.63 cm     TR Peak grad:   39.7 mmHg MV Decel Time: 209 msec     TR Vmax:        315.00 cm/s MR Peak grad: 109.4 mmHg MR Mean grad: 72.0 mmHg  SHUNTS MR Vmax:      523.00 cm/s   Systemic VTI:  0.17 m MR Vmean:     395.0 cm/s    Systemic Diam: 2.00 cm MV E velocity: 115.00 cm/s MV A velocity: 27.60 cm/s MV E/A ratio:  4.17 Oneil Parchment MD Electronically signed by Oneil Parchment MD Signature Date/Time: 03/02/2024/1:40:38 PM    Final    MR ANGIO HEAD WO CONTRAST Result Date: 03/02/2024 EXAM: MR Angiography Head without intravenous contrast. 03/02/2024 12:18:48 AM TECHNIQUE: Magnetic resonance angiography images of the head without intravenous contrast. Multiplanar 2D and 3D reformatted images are provided for review. COMPARISON: None provided. CLINICAL HISTORY: Stroke/TIA, determine embolic source Stroke/TIA, determine embolic source FINDINGS: ANTERIOR CIRCULATION: No significant stenosis of the internal carotid arteries. No significant stenosis of the anterior cerebral arteries. Diminished right M1 MCA low related signal with occluded proximal M2 MCA branch. Reserved for correlate signal in an anterior M2 MCA branch. No visible aneurysm. POSTERIOR CIRCULATION: Right PCA is patent. Severe stenosis versus occlusion of the left  P2 PCA with poor distal flow related signal. The PCAs are not well assessed on this study. No significant stenosis of the basilar artery. Vertebral arteries and proximal basilar artery not well evaluated due to artifact, but appear patent. Other: Findings will be conveyed to the clinical team by the radiology assistant and documented in Clario/PACs. IMPRESSION: 1. Occluded proximal right M2 MCA branch. 2. Severe stenosis versus occlusion of the left P2 PCA, which is not well evaluated on this study. A CTA could better characterize if clinically warranted. Electronically signed by: Gilmore Molt 03/02/2024 01:21 AM EST RP Workstation: [id number]   MR BRAIN WO CONTRAST Result Date: 03/02/2024 EXAM: MRI Brain Without Contrast 03/02/2024 12:18:48 AM TECHNIQUE: Multiplanar multisequence MRI of the head/brain was performed without the administration of intravenous contrast. COMPARISON: CT head 03/01/24. CLINICAL HISTORY: Stroke, follow up FINDINGS: BRAIN AND VENTRICLES: Acute right MCA territory infarct involving the posterior right insula, posterior right frontal lobe, parietal lobe and anterior temporal lobe. Associated edema and mild mass effect with trace (2 mm) leftward midline shift. No intracranial hemorrhage. No mass. No hydrocephalus. The sella is unremarkable. Normal flow voids. ORBITS: No acute abnormality. SINUSES AND MASTOIDS: No acute abnormality. BONES AND SOFT TISSUES: Normal marrow signal. No acute soft tissue abnormality. IMPRESSION: 1. Acute right MCA territory infarct. Trace (2 mm) leftward midline shift. Electronically signed by: Gilmore Molt 03/02/2024 01:06 AM EST RP Workstation: [id number]   CT CHEST ABDOMEN PELVIS WO CONTRAST Result Date: 03/01/2024 EXAM: CT CHEST, ABDOMEN AND PELVIS WITHOUT CONTRAST 03/01/2024 05:18:10 PM TECHNIQUE: CT of the chest, abdomen and pelvis was performed without the administration of intravenous contrast. Multiplanar reformatted images are provided for  review. Automated exposure control, iterative reconstruction, and/or weight based adjustment of the mA/kV was utilized to reduce the radiation dose to as low as reasonably achievable. COMPARISON: Poor evaluation of interval increase in size of the thoracic, abdominal suprarenal, and infrarenal aorta aneurysms compared to 2024. Please see separately dictated CT angiography chest abdomen and pelvis 03/01/2024 for further details. CLINICAL HISTORY: Polytrauma, blunt. FINDINGS: CHEST: MEDIASTINUM AND LYMPH NODES: Enlarged left ventricle. Severe atherosclerotic plaque. The central airways are clear. No gross hilar adenopathy with limited evaluation on this noncontrast study. No mediastinal or axillary lymphadenopathy. Subcentimeter thyroid  hypodense nodules. LUNGS AND PLEURA: Right lower lobe airspace opacity better evaluated on CT angiography of the chest. No pulmonary edema. No pleural effusion or pneumothorax. ABDOMEN AND PELVIS: LIVER: The liver is unremarkable. GALLBLADDER AND BILE  DUCTS: Gallbladder is unremarkable. No biliary ductal dilatation. SPLEEN: No acute abnormality. PANCREAS: No acute abnormality. ADRENAL GLANDS: No acute abnormality. KIDNEYS, URETERS AND BLADDER: Foley catheter terminates within the urinary bladder lumen. No stones in the kidneys or ureters. No hydronephrosis. No perinephric or periureteral stranding. GI AND BOWEL: Stomach demonstrates no acute abnormality. Colonic diverticulosis. No small or large bowel thickening or dilatation. The appendix is unremarkable. There is no bowel obstruction. REPRODUCTIVE ORGANS: The uterus is unremarkable. No left adnexal mass. Elongated 4 x 2.3 cm simple fluid cystic lesion along the right adnexa. PERITONEUM AND RETROPERITONEUM: No ascites. No free air. VASCULATURE: Poor evaluation of interval increase in size of the thoracic, abdominal suprarenal, and infrarenal aorta aneurysms compared to 2024. Please see separately dictated CT angiography chest abdomen  and pelvis 03/01/2024 for further details. ABDOMINAL AND PELVIS LYMPH NODES: No lymphadenopathy. BONES AND SOFT TISSUES: Endotracheal and enteric tubes in appropriate position. Degenerative changes of the spine. L4-S1 surgical hardware. Lower lumbar spine surgical hardware. Total right shoulder arthroplasty partially visualized. No acute fracture. No focal soft tissue abnormality. Persistent right breast 2.1 cm fluid density lesion. IMPRESSION: 1. Poor evaluation of interval increase in size of the thoracic, abdominal suprarenal, and infrarenal aortic aneurysms compared to 2024; please see the separately dictated CT angiography chest, abdomen, and pelvis for further details. 2. Elongated 4.0 x 2.3 cm simple-appearing right adnexal cystic lesion - recommend outpt 6-12 weeks follow-up. 3. Severe atherosclerotic plaque. Electronically signed by: Morgane Naveau MD 03/01/2024 06:07 PM EST RP Workstation: [id number]   CT T-SPINE NO CHARGE Result Date: 03/01/2024 EXAM: CT THORACIC SPINE WITHOUT CONTRAST 03/01/2024 05:18:10 PM TECHNIQUE: CT of the thoracic spine was performed without the administration of intravenous contrast. Multiplanar reformatted images are provided for review. Automated exposure control, iterative reconstruction, and/or weight based adjustment of the mA/kV was utilized to reduce the radiation dose to as low as reasonably achievable. COMPARISON: None available. CLINICAL HISTORY: FINDINGS: BONES AND ALIGNMENT: Normal vertebral body heights. No acute fracture or suspicious bone lesion. Normal alignment. DEGENERATIVE CHANGES: Bilateral mild-to-moderate degenerative changes of the spine. Multilevel mild intervertebral disc space narrowing. SOFT TISSUES: No acute abnormality. IMPRESSION: 1. No acute abnormality of the thoracic spine. 2. Please see separately dictated CT angiography chest abdomen and pelvis. 12:27:25. Electronically signed by: Morgane Naveau MD 03/01/2024 05:38 PM EST RP Workstation:  [id number]   CT L-SPINE NO CHARGE Result Date: 03/01/2024 EXAM: CT OF THE LUMBAR SPINE WITHOUT CONTRAST 03/01/2024 05:18:10 PM TECHNIQUE: CT of the lumbar spine was performed without the administration of intravenous contrast. Multiplanar reformatted images are provided for review. Automated exposure control, iterative reconstruction, and/or weight based adjustment of the mA/kV was utilized to reduce the radiation dose to as low as reasonably achievable. COMPARISON: Please see separately dictated CT angiography chest abdomen and pelvis. 03/01/2024. CLINICAL HISTORY: FINDINGS: BONES AND ALIGNMENT: L4-S1 posterolateral surgical hardware. Normal vertebral body heights. No acute fracture or suspicious bone lesion. Normal alignment. DEGENERATIVE CHANGES: Multilevel moderate degenerative spine. Intervertebral disc space vacuum phenomenon. SOFT TISSUES: No acute abnormality. IMPRESSION: 1. No acute findings. 2. Please see separately dictated CT angiography chest abdomen and pelvis. 12:27:25. Electronically signed by: Morgane Naveau MD 03/01/2024 05:37 PM EST RP Workstation: [id number]   CT CERVICAL SPINE WO CONTRAST Result Date: 03/01/2024 EXAM: CT CERVICAL SPINE WITHOUT CONTRAST 03/01/2024 05:18:10 PM TECHNIQUE: CT of the cervical spine was performed without the administration of intravenous contrast. Multiplanar reformatted images are provided for review. Automated exposure control, iterative reconstruction, and/or weight based adjustment of  the mA/kV was utilized to reduce the radiation dose to as low as reasonably achievable. COMPARISON: None available. CLINICAL HISTORY: Polytrauma, blunt. FINDINGS: BONES AND ALIGNMENT: No acute fracture or traumatic malalignment. DEGENERATIVE CHANGES: Multilevel moderate degenerative change of the spine. No associated severe osseous neural foraminal or central canal stenosis. SOFT TISSUES: No prevertebral soft tissue swelling. Endotracheal and enteric tubes are partially  visualized. Atherosclerotic plaque. Probably secretions noted within the oropharynx superior to the endotracheal balloon. Bilateral thyroid  gland nodules measuring 1 cm or less. VISUALIZED LUNG APICES: Biapical subcentimeter cystic changes. IMPRESSION: 1. No evidence of acute traumatic injury. Electronically signed by: Morgane Naveau MD 03/01/2024 05:32 PM EST RP Workstation: [id number]   CT HEAD WO CONTRAST Result Date: 03/01/2024 EXAM: CT HEAD WITHOUT CONTRAST 03/01/2024 05:18:10 PM TECHNIQUE: CT of the head was performed without the administration of intravenous contrast. Automated exposure control, iterative reconstruction, and/or weight based adjustment of the mA/kV was utilized to reduce the radiation dose to as low as reasonably achievable. COMPARISON: None available. CLINICAL HISTORY: Head trauma, moderate-severe FINDINGS: BRAIN AND VENTRICLES: Large right MCA territory infarction involving the temporal, occipital, and parietal lobes with loss of grey-white matter differentiation, associated edema, mass effect with sulcal effacement and partial effacement of the right lateral ventricle. No acute hemorrhage. No hydrocephalus. No extra-axial collection. Calcific atherosclerosis. ORBITS: Remote right medial orbital wall fracture. SINUSES: No acute abnormality. SOFT TISSUES AND SKULL: Endotracheal and orogastric tubes in place. No skull fracture. IMPRESSION: 1. Acute to subacute large right MCA territory infarction. 2. These findings were discussed with Dr. Guillermina by Dr. Margarite on 03/01/24 at 5:04 pm Electronically signed by: Morgane Naveau MD 03/01/2024 05:28 PM EST RP Workstation: [id number]   DG Wrist Complete Left Result Date: 03/01/2024 EXAM: 3 OR MORE VIEW(S) XRAY OF THE LEFT WRIST 03/01/2024 04:42:00 PM COMPARISON: None available. CLINICAL HISTORY: trauma FINDINGS: BONES AND JOINTS: No acute fracture. No malalignment. Moderate first carpometacarpal degenerative change. SOFT TISSUES: Rounded soft  tissue calcification in volar aspect of forearm, nonspecific. Mild soft tissue edema. No retained radiopaque foreign body. IMPRESSION: 1. No acute fracture or dislocation. Electronically signed by: Morgane Naveau MD 03/01/2024 04:54 PM EST RP Workstation: [id number]   CT Angio Chest/Abd/Pel for Dissection W and/or Wo Contrast Result Date: 03/01/2024 EXAM: CTA CHEST, ABDOMEN AND PELVIS WITH AND WITHOUT CONTRAST 03/01/2024 04:22:15 PM TECHNIQUE: CTA of the chest was performed with and without the administration of intravenous contrast. CTA of the abdomen and pelvis was performed with and without the administration of intravenous contrast. Multiplanar reformatted images are provided for review. MIP images are provided for review. Automated exposure control, iterative reconstruction, and/or weight based adjustment of the mA/kV was utilized to reduce the radiation dose to as low as reasonably achievable. COMPARISON: CTA chest 09/09/2022. CLINICAL HISTORY: Acute aortic syndrome (AAS) suspected. FINDINGS: VASCULATURE: AORTA: The ascending thoracic aorta is stable in size and aneurysmal, measuring up to 4 cm. Worsening of descending thoracic aorta aneurysm measuring up to 7.3 x 5 cm from 5.5 x 4.1 cm in 2024 (5:64) with question of associated penetrating atheromatous ulceration versus saccular aneurysm at this level. Associated interval increase in size of a suprarenal and infrarenal abdominal aorta aneurysm measuring up to 6.2 x 6 cm from 5.1 x 4.2 cm and 4.6 x 4 cm from 4.4 x 3.8 cm. Aneurysm extends down to the aortic bifurcation where it measures 3 cm. The majority of the aneurysm is thrombosed. No dissection. No aortic rupture. No extravasation of contrast. No fistulous tract. No fat  stranding along the aortic aneurysm. Aortic valve leaflet calcification. At least moderate atherosclerotic plaque of the aorta. PULMONARY ARTERIES: No pulmonary embolism with the limits of this exam. GREAT VESSELS OF AORTIC ARCH: No  acute finding. No dissection. No arterial occlusion or significant stenosis. 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 Mild atherosclerotic plaque. SUPERIOR MESENTERIC ARTERY: Mild atherosclerotic plaque. No occlusion or significant stenosis. INFERIOR MESENTERIC ARTERY: Diminutive inferior mesenteric artery. RENAL ARTERIES: At least moderate atherosclerotic plaque of the renal arteries with stenosis due to atherosclerotic plaque. ILIAC ARTERIES: Atherosclerotic plaque. No acute finding. No occlusion or significant stenosis. CHEST: MEDIASTINUM: No mediastinal lymphadenopathy. The heart and pericardium demonstrate no acute abnormality. Aortic valve leaflet calcification. LUNGS AND PLEURA: Endotracheal tube with tip terminating 2 cm above the carina. T bibasilar atelectasis. No focal consolidation or pulmonary edema. No evidence of pleural effusion or pneumothorax. THORACIC BONES AND SOFT TISSUES: No acute bone or soft tissue abnormality. ABDOMEN AND PELVIS: LIVER: The liver is unremarkable. GALLBLADDER AND BILE DUCTS: Gallbladder is unremarkable. No biliary ductal dilatation. SPLEEN: The spleen is unremarkable. PANCREAS: The pancreas is unremarkable. ADRENAL GLANDS: Hyperplastic bilateral adrenal glands with no nodularity. KIDNEYS, URETERS AND BLADDER: No stones in the kidneys or ureters. No hydronephrosis. No perinephric or periureteral stranding. Urinary bladder is unremarkable. Foley catheter terminates within the urinary bladder lumen. GI AND BOWEL: Enteric tube with tip in satisfactory position within the gastric lumen. Stomach and duodenal sweep demonstrate no acute abnormality. There is no bowel obstruction. No abnormal bowel wall thickening or distension. Colonic diverticulosis. REPRODUCTIVE: The uterus is unremarkable. No left adnexal mass. Elongated 4 x 2.3 cm simple fluid cystic lesion along the right adnexa. PERITONEUM AND RETROPERITONEUM: No ascites or free air. LYMPH  NODES: No lymphadenopathy. ABDOMINAL BONES AND SOFT TISSUES: Degenerative changes of the spine. L4-S1 surgical hardware. Right shoulder arthroplasty partially visualized. No acute fracture. No acute soft tissue abnormality. IMPRESSION: 1. Recommend emergent vascular consultation give increased size of aneurysm. No thoracic or abdominal aortic dissection, rupture, contrast extravasation, or fistulous tract is identified. 2. 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 3. Interval enlargement of the suprarenal and infrarenal abdominal aortic aneurysms to 6.2 x 6.0 cm (previously 5.1 x 4.2 cm) and 4.6 x 4.0 cm (previously 4.4 x 3.8 cm), extending to the aortic bifurcation (3.0 cm). 4. 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 5. 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 6. Aortic valve leaflet calcification - correlate for aortic stenosis. 7. Right adnexal simple-appearing cyst measuring 4.0 x 2.3 cm, with follow-up ultrasound in 6-12 months recommended. 8. Other, non-acute and/or normal findings as above. 9. These findings were discussed with doctor Guillermina over the phone on 03/01/2024 at 4:27 pm by doctor Naveau. Electronically signed by: Morgane Naveau MD 03/01/2024 04:47 PM EST RP Workstation: [id number]   DG Forearm Left Result Date: 03/01/2024 EXAM: 1 VIEW(S) XRAY OF THE LEFT FOREARM 03/01/2024 03:54:07 PM COMPARISON: None available. CLINICAL HISTORY: trauma FINDINGS: FINDINGS: BONES AND JOINTS: No acute fracture. No malalignment. SOFT TISSUES: Small calcific density along the radial soft tissues of the mid forearm. IMPRESSION: 1. No acute fracture or dislocation. Electronically signed by: Morgane Naveau MD 03/01/2024 04:07 PM EST RP Workstation: [id number]   DG Hand Complete Left Result Date: 03/01/2024 EXAM: 3 or more VIEW(S) XRAY OF THE LEFT HAND  03/01/2024 03:54:07 PM COMPARISON: XR  left hand 12/31/2013. CLINICAL HISTORY: trauma FINDINGS: BONES AND JOINTS: No acute fracture. No malalignment. Degenerative changes of the first [facility] joint. SOFT TISSUES: Soft tissue edema of the wrist. No retained radioopaque foreign body. IMPRESSION: 1. No acute fracture or dislocation. 2. Soft tissue edema of the wrist without retained radiopaque foreign body. Electronically signed by: Morgane Naveau MD 03/01/2024 04:06 PM EST RP Workstation: [id number]   DG Abdomen 1 View Result Date: 03/01/2024 EXAM: 1 VIEW XRAY OF THE ABDOMEN 03/01/2024 03:35:54 PM COMPARISON: None available. CLINICAL HISTORY: og FINDINGS: LINES, TUBES AND DEVICES: Enteric tube in place with tip and side port projecting over the stomach. BOWEL: Nonobstructive bowel gas pattern. SOFT TISSUES: No abnormal calcifications. BONES: Degenerative changes of the thoracolumbar spine. No acute fracture. IMPRESSION: 1. Enteric tube in place with tip and side port projecting over the stomach. 2. No acute findings. Electronically signed by: Morgane Naveau MD 03/01/2024 03:52 PM EST RP Workstation: [id number]   DG Pelvis Portable Result Date: 03/01/2024 EXAM: 1 OR 2 VIEW(S) XRAY OF THE PELVIS 03/01/2024 03:35:54 PM COMPARISON: None available. CLINICAL HISTORY: Trauma FINDINGS: BONES AND JOINTS: Lumbar fusion hardware in place. Bilateral hip degenerative changes with joint space narrowing and osteophytes. No acute fracture. No malalignment. SOFT TISSUES: Vascular calcifications. IMPRESSION: 1. No evidence of acute traumatic injury. Electronically signed by: Morgane Naveau MD 03/01/2024 03:51 PM EST RP Workstation: [id number]   DG Chest Port 1 View Result Date: 03/01/2024 EXAM: 1 VIEW(S) XRAY OF THE CHEST 03/01/2024 03:35:54 PM COMPARISON: Chest x-ray 06/12/2023, CT chest 09/09/2022. CLINICAL HISTORY: Trauma. FINDINGS: LINES, TUBES AND DEVICES: Endotracheal tube terminates 3 to 4 cm above the carina. Internal  jugular catheter with tip in situ. Cord overlying the expected region of the gastric lumen. LUNGS AND PLEURA: No focal pulmonary opacity. No pleural effusion. No pneumothorax. HEART AND MEDIASTINUM: Gross cardiomegaly. Atherosclerotic plaque. BONES AND SOFT TISSUES: Right shoulder arthroplasty noted. IMPRESSION: 1. No acute cardiopulmonary abnormality. 2. Lines and tubes as above. Electronically signed by: Kate Plummer MD 03/01/2024 03:49 PM EST RP Workstation: [id number]    Labs:  CBC: Recent Labs    03/04/24 0458 03/05/24 0407 03/06/24 0450 03/11/24 0322  WBC 14.1* 14.2* 11.7* 10.2  HGB 10.3* 10.5* 10.5* 10.7*  HCT 33.4* 33.5* 34.8* 34.8*  PLT 189 231 280 484*    COAGS: Recent Labs    03/01/24 1517  APTT 24    BMP: Recent Labs    03/03/24 0448 03/04/24 0458 03/05/24 0407 03/06/24 0450  NA 145 148* 150* 150*  K 3.9 3.9 3.9 4.2  CL 110 113* 115* 115*  CO2 22 26 25 25   GLUCOSE 104* 135* 151* 133*  BUN 46* 39* 36* 33*  CALCIUM  9.0 9.4 9.9 9.8  CREATININE 2.14* 1.72* 1.63* 1.41*  GFRNONAA 24* 31* 33* 39*    LIVER FUNCTION TESTS: Recent Labs    06/12/23 1901 12/19/23 1004 03/01/24 1517  BILITOT 1.3* 1.5* 1.2  AST 16 18 34  ALT 13 13 16   ALKPHOS 72 92 115  PROT 6.9 7.8 8.9*  ALBUMIN  3.2* 3.4* 4.0    TUMOR MARKERS: No results for input(s): AFPTM, CEA, CA199, CHROMGRNA in the last 8760 hours.  Assessment and Plan:  74 y.o. female inpatient. History of schizophrenia, PAD, cardiomyopathy, CAD, CK, HTN, HLD. Presented to the ED at [facility] on 03/01/24 after being found down with left sided hemiparesis, left sided facial droop and a compromised airway requiring intubation. Workup shows a subacute right MCA CVA and a descending thoracic aortic  aneurysm. Currently extubated. Team is requesting a gastrostomy tube placement for nutritional access.  Patient on blood thinners for recent stroke.  Per primary team, this can be held for procedure.  Patient tentatively  scheduled for 03/17/24 after aspirin  washout.  Will need to hold Eliquis  starting 03/15/24.   Currently getting TFs during NGT.  Will place NPO orders night prior to placement.   Patient arouses in visit and tells me she is agreeable to procedure, however does not sustain attention or conversation.  Will reach out to family for consent.    Thank you for this interesting consult.  I greatly enjoyed meeting Erin Riley and look forward to participating in their care.  A copy of this report was sent to the requesting provider on this date.  Electronically Signed: Delon JAYSON Beagle, NP 03/11/2024, 4:00 PM   I spent a total of 20 Minutes   in face to face in clinical consultation, greater than 50% of which was counseling/coordinating care for dysphagia.   "

## 2024-03-11 NOTE — Progress Notes (Signed)
 SLP Cancellation Note  Patient Details Name: Erin Riley MRN: [id number] DOB: [birth year]   Cancelled treatment:        SLP entered room to discuss need for repeated MBS this date. Patient ignored SLP and refused to engage in conversation. Attempted repositioning and turning on lights to promote wakefulness, however patient only appeared to directly respond to stimuli (request to lay back down, turn lights of etc.) and ignored SLPs questions re: swallow testing. Likewise declined any PO trials at bedside. SLP will continue attempts as able.   Rosina Downy, M.A., CCC-SLP   Dashiel Bergquist A Anaija Wissink 03/11/2024, 12:33 PM

## 2024-03-11 NOTE — Plan of Care (Signed)

## 2024-03-12 DIAGNOSIS — I350 Nonrheumatic aortic (valve) stenosis: Secondary | ICD-10-CM | POA: Diagnosis not present

## 2024-03-12 DIAGNOSIS — I639 Cerebral infarction, unspecified: Secondary | ICD-10-CM | POA: Diagnosis not present

## 2024-03-12 DIAGNOSIS — R471 Dysarthria and anarthria: Secondary | ICD-10-CM | POA: Diagnosis not present

## 2024-03-12 DIAGNOSIS — R1310 Dysphagia, unspecified: Secondary | ICD-10-CM | POA: Diagnosis not present

## 2024-03-12 DIAGNOSIS — E440 Moderate protein-calorie malnutrition: Secondary | ICD-10-CM

## 2024-03-12 DIAGNOSIS — I739 Peripheral vascular disease, unspecified: Secondary | ICD-10-CM | POA: Diagnosis not present

## 2024-03-12 DIAGNOSIS — I10 Essential (primary) hypertension: Secondary | ICD-10-CM | POA: Diagnosis not present

## 2024-03-12 LAB — GLUCOSE, CAPILLARY
Glucose-Capillary: 105 mg/dL — ABNORMAL HIGH (ref 70–99)
Glucose-Capillary: 110 mg/dL — ABNORMAL HIGH (ref 70–99)
Glucose-Capillary: 112 mg/dL — ABNORMAL HIGH (ref 70–99)
Glucose-Capillary: 114 mg/dL — ABNORMAL HIGH (ref 70–99)
Glucose-Capillary: 122 mg/dL — ABNORMAL HIGH (ref 70–99)
Glucose-Capillary: 132 mg/dL — ABNORMAL HIGH (ref 70–99)
Glucose-Capillary: 135 mg/dL — ABNORMAL HIGH (ref 70–99)

## 2024-03-12 LAB — BASIC METABOLIC PANEL WITH GFR
Anion gap: 8 (ref 5–15)
BUN: 35 mg/dL — ABNORMAL HIGH (ref 8–23)
CO2: 25 mmol/L (ref 22–32)
Calcium: 9.6 mg/dL (ref 8.9–10.3)
Chloride: 101 mmol/L (ref 98–111)
Creatinine, Ser: 1 mg/dL (ref 0.44–1.00)
GFR, Estimated: 60 mL/min — ABNORMAL LOW
Glucose, Bld: 129 mg/dL — ABNORMAL HIGH (ref 70–99)
Potassium: 4.7 mmol/L (ref 3.5–5.1)
Sodium: 134 mmol/L — ABNORMAL LOW (ref 135–145)

## 2024-03-12 NOTE — Progress Notes (Addendum)
 " PROGRESS NOTE    Erin Riley  FMW:[id number] DOB: 1950/12/04 DOA: 03/01/2024 PCP: [name], MD   Brief Narrative:  Ms. Blaydes is a 74 yo female with PMH CAD, CKD, HLD, HTN, PAD, schizophrenia, severe AS who was found on the floor at home.  She had dense left-sided weakness, slurred speech, facial droop.  She required intubation upon arrival to the ER.  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  MRI brain showed acute right MCA territory infarct with 2 mm leftward midline shift.  She was able to be weaned off pressors and extubated.  Also evaluated by vascular surgery.  Subsequently she is not able to take any oral intake and has a core track.  Given her inability to advance her diet PEG tube is recommended with tentative plan of placement on 03/17/2024 per the IR team.  PT OT recommending CIR but may not be a candidate and may need to pursue SNF and other venues.  Assessment and Plan:   Subacute R MCA CVA  Dysphagia as a result of CVA -Presented outside of window for intervention. Etiology felt most likely cardioembolic  - Appreciate stroke team's management.  Agree that stroke was likely cardioembolic, although no LV thrombus seen on echo -SBP goal is complex:  enlarging aortic aneurysms, hx AS -- need to avoid significant HTN with rupture risk. Goal SBP 100-140  -Cortrak; continue TF for now; SLP will continue following -Unable to perform MBS on 1/5 due to her agitation and unable to follow prompts; will discuss with family about placing PEG tube at this point as she is likely to need and if does happen to progress in the future, can be removed; do not think family wants to be anything other than aggressive at this point -potential CIR candidate; has  significant dense left hemiplegia with neglect; if declined, will need SNF pursuit after PEG placement; per to the inpatient rehabilitation coordinator she has low tolerance for and slow progression with therapy and they are likely cannot recommend other venues but we will continue to check on her in house -C/w Atorvastatin  80 mg per Tube Daily  - Asa on hold 1/6 - 1/12 in anticipation of PEG on 1/12, then resume ASA 1/13 -Continue Eliquis  thru 1/9; hold 48 hrs for PEG on 1/12 and resume on 1/13 - IR requesting repeat SLP eval prior to PEG. Low suspicion that mentation will allow to participate in MBS. 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  -not a candidate for open or endovascular repair at this time. See VVS note 12/28 for details   Hx NICM  HFrEF -- LVEF 25-30%  Severe AS -Not a TAVR candidate  -GDMT limited at this time w renal dysfxn; Holding w/ ASA 81 mg per NG Tube Daily and resuming 1/13; C/w Atrovastatin 80 mg per Tube Daily, and Carvedilol  12.5 mg per Tube BID   AKI on CKD3b, improving  Elevated CK, improving  -D/C'd Foley;  Renal function  improving and back to baseline. BUN/Cr Trend: Recent Labs  Lab 03/01/24 1531 03/01/24 2039 03/02/24 0427 03/03/24 0448 03/04/24 0458 03/05/24 0407 03/06/24 0450 03/12/24 0502  BUN 50*  --  57* 46* 39* 36* 33* 35*  CREATININE 2.70* 2.87* 3.32* 2.14* 1.72* 1.63* 1.41* 1.00  -IVF now stopped and getting TF -Avoid Nephrotoxic Medications, Contrast Dyes, Hypotension and Dehydration to Ensure Adequate Renal Perfusion and will  need to Renally Adjust Meds -Continue to Monitor and Trend Renal Function carefully and repeat CMP in the AM   Hypernatremia -> Hyponatremia: Na+ Trend:  Recent Labs  Lab 03/02/24 0427 03/02/24 0538 03/03/24 0448 03/04/24 0458 03/05/24 0407 03/06/24 0450 03/12/24 0502  NA 139 139 145 148* 150* 150* 134*  -CTM and Trend and repeat CMP in the AM    DM2 w Hyperglycemia: C/w Moderate Novolog  SSI q4h. CTM CBGs per Protocol. CBG Trend:  Recent Labs  Lab 03/11/24 1639 03/11/24 1953 03/12/24 0052 03/12/24 0430 03/12/24 0851 03/12/24 1136 03/12/24 1610  GLUCAP 125* 138* 132* 122* 105* 112* 114*   Leukocytosis + low grade temp: Resolved on last check. Some upper lobe rhonchi, plausible aspirations  -MRSA PCR neg. Had a foley but removed. Augmentin  course completed. WBC Trend:  Recent Labs  Lab 03/01/24 2039 03/02/24 0427 03/03/24 0448 03/04/24 0458 03/05/24 0407 03/06/24 0450 03/11/24 0322  WBC 10.8* 12.4* 17.2* 14.1* 14.2* 11.7* 10.2   Normocytic Anemia: Hgb/Hct Trend fairly stable:  Recent Labs  Lab 03/02/24 0427 03/02/24 0538 03/03/24 0448 03/04/24 0458 03/05/24 0407 03/06/24 0450 03/11/24 0322  HGB 11.9* 9.9* 10.3* 10.3* 10.5* 10.5* 10.7*  HCT 38.4 29.0* 33.0* 33.4* 33.5* 34.8* 34.8*  MCV 91.4  --  91.2 92.3 93.6 94.1 91.1  -Check Anemia Panel in the AM. CTM for S/Sx of Bleeding; No overt bleeding noted. Repeat CBC in the AM   Thrombocytosis: Mild and in the setting of above. Plt Count Trend:  Recent Labs  Lab 03/01/24 2039 03/02/24 0427 03/03/24 0448 03/04/24 0458 03/05/24 0407 03/06/24 0450 03/11/24 0322  PLT 222 208 183 189 231 280 484*  -CTM and Trend and repeat CBC In the AM  Non-Severe (Moderate) Malnutrition in the context of chronic illness: Nutrition Status: Nutrition Problem: Moderate Malnutrition Etiology: chronic illness Signs/Symptoms: mild fat depletion, mild muscle depletion Interventions: Refer to RD note for recommendations -  Currently getting tube feedings via core track with Osmolite 1.2 at 60 mL/h with free water  flushes 200 mL every 4 hours.  The nutritionist has added Juven twice daily and are recommending PEG tube placement given her inability to advance her diet orally  Class I Obesity: Complicates overall prognosis and care. Estimated body mass index is 33.42 kg/m as calculated from the following:   Height as of this encounter: 5' 7 (1.702 m).   Weight as of this encounter: 96.8 kg. Weight Loss and Dietary Counseling given   DVT prophylaxis: SCDs Start: 03/01/24 1745 apixaban  (ELIQUIS ) tablet 5 mg  apixaban  (ELIQUIS ) tablet 5 mg    Code Status: Full Code Family Communication: No family cardiac bedside  Disposition Plan:  Level of care: Med-Surg Status is: Inpatient Remains inpatient appropriate because: Needs further clinical improvement and safe discharge disposition as well as PEG tube placement   Consultants:  Neurology Interventional radiology Vascular surgery  Procedures:  As delineated as above  Antimicrobials:  Anti-infectives (From admission, onward)    Start     Dose/Rate Route Frequency Ordered Stop   03/17/24 0800  ceFAZolin  (ANCEF ) IVPB 2g/100  mL premix        2 g 200 mL/hr over 30 Minutes Intravenous To Radiology 03/11/24 1611 03/18/24 0800   03/05/24 1130  amoxicillin -clavulanate (AUGMENTIN ) 875-125 MG per tablet 1 tablet        1 tablet Per Tube Every 12 hours 03/05/24 1036 03/07/24 2101   03/03/24 0930  cefTRIAXone  (ROCEPHIN ) 2 g in sodium chloride  0.9 % 100 mL IVPB  Status:  Discontinued        2 g 200 mL/hr over 30 Minutes Intravenous Every 24 hours 03/03/24 0838 03/05/24 1036       Subjective: Seen and examined at bedside continues to have difficulty with following some commands and continues to have severe dysarthria but was little bit more responsive.  Has dense hemiplegia on the left upper extremity.  No nausea or vomiting.  Awaiting PEG tube placement however this  will not be done until at least 03/17/2024.  No overnight events noted.  Objective: Vitals:   03/12/24 0858 03/12/24 1247 03/12/24 1600 03/12/24 1616  BP: (!) 125/106 127/85  97/75  Pulse: 71 71  74  Resp: 15 20  16   Temp: 98.3 F (36.8 C) 98.3 F (36.8 C) 98.2 F (36.8 C) 98.2 F (36.8 C)  TempSrc: Oral Oral Oral Oral  SpO2: 100% 95%  99%  Weight:      Height:        Intake/Output Summary (Last 24 hours) at 03/12/2024 1727 Last data filed at 03/12/2024 1612 Gross per 24 hour  Intake 3316 ml  Output 1900 ml  Net 1416 ml   Filed Weights   03/10/24 0513 03/11/24 0500 03/12/24 0500  Weight: 95.8 kg 95.8 kg 96.8 kg   Examination: Physical Exam:  Constitutional: WN/WD obese chronically ill-appearing elderly African-American female in no acute distress but is extremely dysarthric and has dense hemiplegia Respiratory: Diminished to auscultation bilaterally, no wheezing, rales, rhonchi or crackles. Normal respiratory effort and patient is not tachypenic. No accessory muscle use.  Unlabored breathing Cardiovascular: RRR, no murmurs / rubs / gallops. S1 and S2 auscultated.  A little lower extremity edema Abdomen: Soft, non-tender, distended secondary body habitus. Bowel sounds positive.  GU: Deferred. Musculoskeletal: No clubbing / cyanosis of digits/nails. No joint deformity upper and lower extremities.  Skin: No rashes, lesions, ulcers on limited skin evaluation. No induration; Warm and dry.  Neurologic: Severely dysarthric and has dense hemiplegia with neglect as well as on the left lower extremity Psychiatric: Impaired judgment and insight but is awake  Data Reviewed: I have personally reviewed following labs and imaging studies  CBC: Recent Labs  Lab 03/06/24 0450 03/11/24 0322  WBC 11.7* 10.2  NEUTROABS 8.7*  --   HGB 10.5* 10.7*  HCT 34.8* 34.8*  MCV 94.1 91.1  PLT 280 484*   Basic Metabolic Panel: Recent Labs  Lab 03/06/24 0450 03/12/24 0502  NA 150* 134*  K  4.2 4.7  CL 115* 101  CO2 25 25  GLUCOSE 133* 129*  BUN 33* 35*  CREATININE 1.41* 1.00  CALCIUM  9.8 9.6  MG 2.6*  --    GFR: Estimated Creatinine Clearance: 60.8 mL/min (by C-G formula based on SCr of 1 mg/dL). Liver Function Tests: No results for input(s): AST, ALT, ALKPHOS, BILITOT, PROT, ALBUMIN  in the last 168 hours. No results for input(s): LIPASE, AMYLASE in the last 168 hours. No results for input(s): AMMONIA in the last 168 hours. Coagulation Profile: No results for input(s): INR, PROTIME in the last 168 hours. Cardiac Enzymes:  No results for input(s): CKTOTAL, CKMB, CKMBINDEX, TROPONINI in the last 168 hours. BNP (last 3 results) No results for input(s): PROBNP in the last 8760 hours. HbA1C: No results for input(s): HGBA1C in the last 72 hours. CBG: Recent Labs  Lab 03/12/24 0052 03/12/24 0430 03/12/24 0851 03/12/24 1136 03/12/24 1610  GLUCAP 132* 122* 105* 112* 114*   Lipid Profile: No results for input(s): CHOL, HDL, LDLCALC, TRIG, CHOLHDL, LDLDIRECT in the last 72 hours. Thyroid  Function Tests: No results for input(s): TSH, T4TOTAL, FREET4, T3FREE, THYROIDAB in the last 72 hours. Anemia Panel: No results for input(s): VITAMINB12, FOLATE, FERRITIN, TIBC, IRON , RETICCTPCT in the last 72 hours. Sepsis Labs: No results for input(s): PROCALCITON, LATICACIDVEN in the last 168 hours.  Recent Results (from the past 240 hours)  Culture, blood (Routine X 2) w Reflex to ID Panel     Status: None   Collection Time: 03/03/24  9:31 AM   Specimen: BLOOD  Result Value Ref Range Status   Specimen Description BLOOD BLOOD LEFT HAND  Final   Special Requests   Final    BOTTLES DRAWN AEROBIC AND ANAEROBIC Blood Culture adequate volume   Culture   Final    NO GROWTH 5 DAYS Performed at [facility], [address] [place], [place] [address]    Report Status 03/08/2024 FINAL  Final  Culture,  blood (Routine X 2) w Reflex to ID Panel     Status: None   Collection Time: 03/03/24  9:34 AM   Specimen: BLOOD  Result Value Ref Range Status   Specimen Description BLOOD BLOOD LEFT ARM  Final   Special Requests   Final    BOTTLES DRAWN AEROBIC AND ANAEROBIC Blood Culture adequate volume   Culture   Final    NO GROWTH 5 DAYS Performed at [facility], [address], [place], [place] [address]    Report Status 03/08/2024 FINAL  Final    Radiology Studies: No results found.  Scheduled Meds:  amLODipine   10 mg Per Tube Daily   apixaban   5 mg Per Tube BID   [START ON 03/18/2024] apixaban   5 mg Per Tube BID   [START ON 03/18/2024] aspirin   81 mg Per NG tube Daily   atorvastatin   80 mg Per Tube Daily   carvedilol   12.5 mg Per Tube BID   docusate  100 mg Per Tube BID   free water   200 mL Per Tube Q4H   insulin  aspart  0-15 Units Subcutaneous Q4H   nicotine   21 mg Transdermal Daily   nutrition supplement (JUVEN)  1 packet Per Tube BID BM   OLANZapine   7.5 mg Per Tube QHS   mouth rinse  15 mL Mouth Rinse 4 times per day   polyethylene glycol  17 g Per Tube Daily   Continuous Infusions:  [START ON 03/17/2024]  ceFAZolin  (ANCEF ) IV     feeding supplement (OSMOLITE 1.2 CAL) 1,000 mL (03/12/24 1021)    LOS: 11 days   Alejandro Marker, DO [facility] Available via Epic secure chat 7am-7pm After these hours, please refer to coverage provider listed on [email or web address] 03/12/2024, 5:27 PM  "

## 2024-03-12 NOTE — Progress Notes (Signed)
 Inpatient Rehab Admissions Coordinator:   Rescreened for CIR.  Pt continues to have low tolerance for and slow progression with therapy.  If she is at or near ready to discharge would recommend [facility] pursue other rehab venues.  We can continue to check in periodically while pt remains in house but will not pursue CIR at this time.   Reche Lowers, PT, DPT Admissions Coordinator [phone] 03/12/2024 9:41 AM

## 2024-03-12 NOTE — Plan of Care (Signed)
" °  Problem: Education: Goal: Ability to describe self-care measures that may prevent or decrease complications (Diabetes Survival Skills Education) will improve Outcome: Progressing Goal: Individualized Educational Video(s) Outcome: Progressing   Problem: Coping: Goal: Ability to adjust to condition or change in health will improve Outcome: Progressing   Problem: Fluid Volume: Goal: Ability to maintain a balanced intake and output will improve Outcome: Progressing   Problem: Health Behavior/Discharge Planning: Goal: Ability to identify and utilize available resources and services will improve Outcome: Progressing Goal: Ability to manage health-related needs will improve Outcome: Progressing   Problem: Nutritional: Goal: Maintenance of adequate nutrition will improve Outcome: Progressing Goal: Progress toward achieving an optimal weight will improve Outcome: Progressing   Problem: Skin Integrity: Goal: Risk for impaired skin integrity will decrease Outcome: Progressing   Problem: Education: Goal: Knowledge of disease or condition will improve Outcome: Progressing Goal: Knowledge of secondary prevention will improve (MUST DOCUMENT ALL) Outcome: Progressing Goal: Knowledge of patient specific risk factors will improve (DELETE if not current risk factor) Outcome: Progressing   Problem: Tissue Perfusion: Goal: Adequacy of tissue perfusion will improve Outcome: Progressing   "

## 2024-03-12 NOTE — Progress Notes (Signed)
 Occupational Therapy Treatment Patient Details Name: Erin Riley MRN: [id number] DOB: [birth year] Today's Date: 03/12/2024   History of present illness Pt is a 74 y/o F admitted 03/01/24 after being found down by her son with left sided weakness, slurred speech and facial droop, also intermittent episodes of apnea. 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 She was intubated 12/27-28/25. PMH includes HTN, hypertrophic cardiomyopathy, PAD, CKD III, HLD, schizophrenia, AAA.   OT comments  Pt making slow progress with functional goals. Pt lethargic initially then became more alert with verbal,tactile and physical cues/prompts. Pt sat EOB min A. Pt sat EOB x ~15 minutes with CGA/min A to maintain balance during grooming/hygiene, UB ADLs and L UE PROM. Throughout pt required max multimodal cues due to cognition, L inattention, R gaze preference with cues to attend to L side with therapist to L side of pt during activity.  OT will continue to follow acutely to maximize level of function and safety      If plan is discharge home, recommend the following:  Two people to help with walking and/or transfers;Assist for transportation;Supervision due to cognitive status;A lot of help with bathing/dressing/bathroom   Equipment Recommendations  Other (comment) (defer)    Recommendations for Other Services      Precautions / Restrictions Precautions Precautions: Fall Recall of Precautions/Restrictions: Impaired Precaution/Restrictions Comments: L inattention, cortrak Restrictions Weight Bearing Restrictions Per Provider Order: No       Mobility Bed Mobility Overal bed mobility: Needs Assistance         Sit to supine: Min assist, HOB elevated   General bed mobility comments: mod A to initiate to L side, min A to elevate trunk and min A to scoot to EOB. Pt able to manage LEs back onto bed without assist  and move hips over in bed    Transfers                         Balance Overall balance assessment: Needs assistance Sitting-balance support: Single extremity supported, Feet supported Sitting balance-Leahy Scale: Fair Sitting balance - Comments: verbal and physical cues for R hand placement. Pt sat EOB x ~15 minutes with CGA/min A to maintain balance during grooming/hygiene, UB ADLs and L UE PROM Postural control: Right lateral lean                                 ADL either performed or assessed with clinical judgement   ADL Overall ADL's : Needs assistance/impaired     Grooming: Wash/dry hands;Wash/dry face;Minimal assistance;Sitting   Upper Body Bathing: Moderate assistance;Sitting   Lower Body Bathing: Maximal assistance;Sitting/lateral leans   Upper Body Dressing : Moderate assistance;Sitting                     General ADL Comments: max multimodal cues due to cognition, L inattention, R gaze preference with cues to attend to L side with therapist to L side of pt during activity. Pt sat EOB x ~15 minutes with CGA/min A to maintain balance during grooming/hygiene, UB ADLs and L UE PROM    Extremity/Trunk Assessment Upper Extremity Assessment Upper Extremity Assessment: Generalized weakness;Right hand dominant;LUE deficits/detail LUE Deficits / Details: edematous, flaccid LUE Sensation: decreased light touch;decreased proprioception LUE Coordination: decreased fine motor;decreased gross motor   Lower Extremity Assessment Lower Extremity  Assessment: Defer to PT evaluation        Vision Patient Visual Report: No change from baseline     Perception     Praxis     Communication Communication Communication: Impaired Factors Affecting Communication: Difficulty expressing self;Reduced clarity of speech   Cognition Arousal: Lethargic, Alert (fluctuating) Behavior During Therapy: Impulsive Cognition: Cognition impaired              OT - Cognition Comments: pt asking therapist take this out of my nose. OT expalined to pt that cortrak was necessary for recovery and MD would let her know when it could be removed, pt nodded in agreement and said ok and pt did not mention it again during session                 Following commands: Impaired Following commands impaired: Follows one step commands inconsistently      Cueing   Cueing Techniques: Verbal cues, Gestural cues, Tactile cues, Visual cues  Exercises Other Exercises Other Exercises: Pt tolerated PROM of L UE sitting EOB    Shoulder Instructions       General Comments      Pertinent Vitals/ Pain       Pain Assessment Pain Assessment: No/denies pain Faces Pain Scale: No hurt  Home Living Family/patient expects to be discharged to:: Private residence                                        Prior Functioning/Environment              Frequency  Min 2X/week        Progress Toward Goals  OT Goals(current goals can now be found in the care plan section)  Progress towards OT goals: Progressing toward goals     Plan      Co-evaluation                 AM-PAC OT 6 Clicks Daily Activity     Outcome Measure   Help from another person eating meals?: Total Help from another person taking care of personal grooming?: A Little Help from another person toileting, which includes using toliet, bedpan, or urinal?: Total Help from another person bathing (including washing, rinsing, drying)?: A Lot Help from another person to put on and taking off regular upper body clothing?: A Lot Help from another person to put on and taking off regular lower body clothing?: Total 6 Click Score: 10    End of Session    OT Visit Diagnosis: Other abnormalities of gait and mobility (R26.89);Muscle weakness (generalized) (M62.81);Low vision, both eyes (H54.2);Other symptoms and signs involving the nervous system (R29.898);Other  symptoms and signs involving cognitive function;Hemiplegia and hemiparesis Hemiplegia - Right/Left: Left Hemiplegia - dominant/non-dominant: Non-Dominant Hemiplegia - caused by: Cerebral infarction   Activity Tolerance Patient tolerated treatment well   Patient Left in bed;with call bell/phone within reach;with bed alarm set   Nurse Communication Mobility status        Time: 8576-8550 OT Time Calculation (min): 26 min  Charges: OT General Charges $OT Visit: 1 Visit OT Treatments $Self Care/Home Management : 8-22 mins $Therapeutic Activity: 8-22 mins    Jacques Karna Loose 03/12/2024, 3:29 PM

## 2024-03-13 ENCOUNTER — Inpatient Hospital Stay (HOSPITAL_COMMUNITY)

## 2024-03-13 DIAGNOSIS — I639 Cerebral infarction, unspecified: Secondary | ICD-10-CM | POA: Diagnosis not present

## 2024-03-13 DIAGNOSIS — R1310 Dysphagia, unspecified: Secondary | ICD-10-CM | POA: Diagnosis not present

## 2024-03-13 DIAGNOSIS — I10 Essential (primary) hypertension: Secondary | ICD-10-CM | POA: Diagnosis not present

## 2024-03-13 DIAGNOSIS — R471 Dysarthria and anarthria: Secondary | ICD-10-CM | POA: Diagnosis not present

## 2024-03-13 LAB — GLUCOSE, CAPILLARY
Glucose-Capillary: 135 mg/dL — ABNORMAL HIGH (ref 70–99)
Glucose-Capillary: 107 mg/dL — ABNORMAL HIGH (ref 70–99)
Glucose-Capillary: 124 mg/dL — ABNORMAL HIGH (ref 70–99)
Glucose-Capillary: 89 mg/dL (ref 70–99)
Glucose-Capillary: 149 mg/dL — ABNORMAL HIGH (ref 70–99)

## 2024-03-13 LAB — CBC WITH DIFFERENTIAL/PLATELET
Abs Immature Granulocytes: 0.05 K/uL (ref 0.00–0.07)
Basophils Absolute: 0 K/uL (ref 0.0–0.1)
Basophils Relative: 0 %
Eosinophils Absolute: 0.2 K/uL (ref 0.0–0.5)
Eosinophils Relative: 3 %
HCT: 32.1 % — ABNORMAL LOW (ref 36.0–46.0)
Hemoglobin: 10.1 g/dL — ABNORMAL LOW (ref 12.0–15.0)
Immature Granulocytes: 1 %
Lymphocytes Relative: 15 %
Lymphs Abs: 1.4 K/uL (ref 0.7–4.0)
MCH: 28.1 pg (ref 26.0–34.0)
MCHC: 31.5 g/dL (ref 30.0–36.0)
MCV: 89.4 fL (ref 80.0–100.0)
Monocytes Absolute: 0.8 K/uL (ref 0.1–1.0)
Monocytes Relative: 8 %
Neutro Abs: 6.7 K/uL (ref 1.7–7.7)
Neutrophils Relative %: 73 %
Platelets: 531 K/uL — ABNORMAL HIGH (ref 150–400)
RBC: 3.59 MIL/uL — ABNORMAL LOW (ref 3.87–5.11)
RDW: 14.5 % (ref 11.5–15.5)
WBC: 9.2 K/uL (ref 4.0–10.5)
nRBC: 0 % (ref 0.0–0.2)

## 2024-03-13 LAB — COMPREHENSIVE METABOLIC PANEL WITH GFR
ALT: 81 U/L — ABNORMAL HIGH (ref 0–44)
AST: 71 U/L — ABNORMAL HIGH (ref 15–41)
Albumin: 3.1 g/dL — ABNORMAL LOW (ref 3.5–5.0)
Alkaline Phosphatase: 135 U/L — ABNORMAL HIGH (ref 38–126)
Anion gap: 7 (ref 5–15)
BUN: 30 mg/dL — ABNORMAL HIGH (ref 8–23)
CO2: 26 mmol/L (ref 22–32)
Calcium: 9.9 mg/dL (ref 8.9–10.3)
Chloride: 102 mmol/L (ref 98–111)
Creatinine, Ser: 1.02 mg/dL — ABNORMAL HIGH (ref 0.44–1.00)
GFR, Estimated: 58 mL/min — ABNORMAL LOW
Glucose, Bld: 129 mg/dL — ABNORMAL HIGH (ref 70–99)
Potassium: 4.7 mmol/L (ref 3.5–5.1)
Sodium: 135 mmol/L (ref 135–145)
Total Bilirubin: 0.5 mg/dL (ref 0.0–1.2)
Total Protein: 7.4 g/dL (ref 6.5–8.1)

## 2024-03-13 LAB — MAGNESIUM: Magnesium: 2.3 mg/dL (ref 1.7–2.4)

## 2024-03-13 LAB — PHOSPHORUS: Phosphorus: 3.4 mg/dL (ref 2.5–4.6)

## 2024-03-13 NOTE — Progress Notes (Signed)
 " PROGRESS NOTE    CHELSAE ZANELLA  FMW:[id number] DOB: 09-26-1950 DOA: 03/01/2024 PCP: [name], MD   Brief Narrative:  Ms. Mcmaster is a 74 yo female with PMH CAD, CKD, HLD, HTN, PAD, schizophrenia, severe AS who was found on the floor at home.  She had dense left-sided weakness, slurred speech, facial droop.  She required intubation upon arrival to the ER.  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  MRI brain showed acute right MCA territory infarct with 2 mm leftward midline shift.  She was able to be weaned off pressors and extubated.  Also evaluated by vascular surgery.  Subsequently she is not able to take any oral intake and has a core track.  Given her inability to advance her diet initially PEG tube was recommended with tentative plan of placement on 03/17/2024 per the IR team. MBS done and now on D1 Honey Thick Liquid Diet and will see if she tolerates.   PT/OT recommending CIR but may not be a candidate and may need to pursue SNF and other venues.  Assessment and Plan:   Subacute R MCA CVA  Dysphagia as a result of CVA -Presented outside of window for intervention. Etiology felt most likely cardioembolic  - Appreciate stroke team's management.  Agree that stroke was likely cardioembolic, although no LV thrombus seen on echo -SBP goal is complex:  enlarging aortic aneurysms, hx AS -- need to avoid significant HTN with rupture risk. Goal SBP 100-140  -Cortrak; continue TF for now; SLP will continue following -Unable to perform MBS on 1/5 due to her agitation and unable to follow prompts; will discuss with family about placing PEG tube at this point as she is likely to need and if does happen to progress in the future, can be removed; do not think  family wants to be anything other than aggressive at this point -potential CIR candidate; has significant dense left hemiplegia with neglect; if declined, will need SNF pursuit after PEG placement; per to the inpatient rehabilitation coordinator she has low tolerance for and slow progression with therapy and they are likely cannot recommend other venues but we will continue to check on her in house -C/w Atorvastatin  80 mg per Tube Daily for now but may need to hold given Abnormal LFTs - Asa on hold 1/6 - 1/12 in anticipation of PEG on 1/12, then resume ASA 1/13 -Continue Eliquis  thru 1/9; hold 48 hrs for PEG on 1/12 and resume on 1/13 - IR requesting repeat SLP eval prior to PEG.  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  -not a candidate for open or endovascular repair at this time. See VVS note 12/28 for details   Hx NICM  HFrEF -- LVEF 25-30%  Severe AS -Not a TAVR candidate  -GDMT limited at this time w renal dysfxn; Holding w/ ASA 81 mg per NG Tube Daily and resuming 1/13; C/w Atrovastatin 80 mg per Tube Daily, and Carvedilol  12.5 mg per Tube BID   AKI on CKD3b, improving  Elevated CK, improving  -D/C'd Foley;  Renal function improving and back to baseline. BUN/Cr Trend: Recent Labs  Lab 03/02/24 0427 03/03/24 0448 03/04/24 0458 03/05/24 0407 03/06/24 0450 03/12/24 0502  03/13/24 0617  BUN 57* 46* 39* 36* 33* 35* 30*  CREATININE 3.32* 2.14* 1.72* 1.63* 1.41* 1.00 1.02*  -IVF now stopped and getting TF -Avoid Nephrotoxic Medications, Contrast Dyes, Hypotension and Dehydration to Ensure Adequate Renal Perfusion and will need to Renally Adjust Meds -Continue to Monitor and Trend Renal Function carefully and repeat CMP in the AM   Abnormal LFTs: AST and ALT Trend:  Recent Labs  Lab 03/01/24 1517 03/13/24 0617  AST 34 71*  ALT 16 81*  -Likely reactive. CTM and Trend and may need to hold Atorvastatin  per Tube given Elevation.   Hypernatremia -> Hyponatremia: Improved. Na+ Trend:  Recent Labs  Lab 03/02/24 0538 03/03/24 0448 03/04/24 0458 03/05/24 0407 03/06/24 0450 03/12/24 0502 03/13/24 0617  NA 139 145 148* 150* 150* 134* 135  -CTM and Trend and repeat CMP in the AM    DM2 w Hyperglycemia: C/w Moderate Novolog  SSI q4h. CTM CBGs per Protocol. CBG Trend:  Recent Labs  Lab 03/12/24 1610 03/12/24 2024 03/12/24 2357 03/13/24 0411 03/13/24 0727 03/13/24 1146 03/13/24 1644  GLUCAP 114* 135* 110* 135* 107* 124* 89   Leukocytosis + low grade temp: Resolved on last check. Some upper lobe rhonchi, plausible aspirations  -MRSA PCR neg. Had a foley but removed. Augmentin  course completed. WBC Trend:  Recent Labs  Lab 03/02/24 0427 03/03/24 0448 03/04/24 0458 03/05/24 0407 03/06/24 0450 03/11/24 0322 03/13/24 0617  WBC 12.4* 17.2* 14.1* 14.2* 11.7* 10.2 9.2   Normocytic Anemia: Hgb/Hct Trend fairly stable:  Recent Labs  Lab 03/02/24 0538 03/03/24 0448 03/04/24 0458 03/05/24 0407 03/06/24 0450 03/11/24 0322 03/13/24 0617  HGB 9.9* 10.3* 10.3* 10.5* 10.5* 10.7* 10.1*  HCT 29.0* 33.0* 33.4* 33.5* 34.8* 34.8* 32.1*  MCV  --  91.2 92.3 93.6 94.1 91.1 89.4  -Check Anemia Panel in the AM. CTM for S/Sx of Bleeding; No overt bleeding noted. Repeat CBC in the AM   Thrombocytosis: Mild and in the setting of above. Plt Count Trend:  Recent  Labs  Lab 03/02/24 0427 03/03/24 0448 03/04/24 0458 03/05/24 0407 03/06/24 0450 03/11/24 0322 03/13/24 0617  PLT 208 183 189 231 280 484* 531*  -CTM and Trend and repeat CBC In the AM  Non-Severe (Moderate) Malnutrition in the context of chronic illness: Nutrition Status: Nutrition Problem: Moderate Malnutrition Etiology: chronic illness Signs/Symptoms: mild fat depletion, mild muscle depletion Interventions: Refer to RD note for recommendations - Currently getting tube feedings via core track with Osmolite 1.2 at 60 mL/h with free water  flushes 200 mL every 4 hours.  The nutritionist has added Juven twice daily and are recommending PEG tube placement given her inability to advance her diet orally  Hypoalbuminemia: Patient's Albumin  Level went from 4.0 -> 3.1. CTM & Trend & repeat CMP in the AM  Class I Obesity: Complicates overall prognosis and care. Estimated body mass index is 33.15 kg/m as calculated from the following:   Height as of this encounter: 5' 7 (1.702 m).   Weight as of this encounter: 96 kg.  Weight Loss and Dietary Counseling given   DVT prophylaxis: SCDs Start: 03/01/24 1745 apixaban  (ELIQUIS ) tablet 5 mg  apixaban  (ELIQUIS ) tablet 5 mg    Code Status: Full Code Family Communication: No family present @ bedside   Disposition Plan:  Level of care: Med-Surg Status is: Inpatient Remains inpatient appropriate because: Needs further clinical improvement and will need to determine dispositon   Consultants:  Neurology Interventional Radiology Vascular Surgery  Procedures:  As delineated as above   Antimicrobials:  Anti-infectives (From admission, onward)    Start     Dose/Rate Route Frequency Ordered Stop   03/17/24 0800  ceFAZolin  (ANCEF ) IVPB 2g/100 mL premix        2 g 200 mL/hr over 30 Minutes Intravenous To Radiology 03/11/24 1611 03/18/24 0800   03/05/24 1130  amoxicillin -clavulanate (AUGMENTIN ) 875-125 MG per tablet 1 tablet        1 tablet Per  Tube Every 12 hours 03/05/24 1036 03/07/24 2101   03/03/24 0930  cefTRIAXone  (ROCEPHIN ) 2 g in sodium chloride  0.9 % 100 mL IVPB  Status:  Discontinued        2 g 200 mL/hr over 30 Minutes Intravenous Every 24 hours 03/03/24 0838 03/05/24 1036       Subjective: Seen and examined at bedside and was more awake and alert and interactive today.  Was working with a physical therapist and I walked in and able to follow some commands.  Plan for MBS and now on a D1 honey thick liquid diet.  Denies any current pain.  No other concerns or complaints this time  Objective: Vitals:   03/13/24 0411 03/13/24 0718 03/13/24 0734 03/13/24 1117  BP: (!) 146/96  129/76 116/70  Pulse: 75  77 74  Resp: 18  16 16   Temp: 97.6 F (36.4 C)  97.6 F (36.4 C) 97.6 F (36.4 C)  TempSrc: Oral  Oral Oral  SpO2: 100%  98% 100%  Weight:  96 kg    Height:        Intake/Output Summary (Last 24 hours) at 03/13/2024 1855 Last data filed at 03/13/2024 1800 Gross per 24 hour  Intake 1440 ml  Output 650 ml  Net 790 ml   Filed Weights   03/11/24 0500 03/12/24 0500 03/13/24 0718  Weight: 95.8 kg 96.8 kg 96 kg   Examination: Physical Exam:  Constitutional: WN/WD obese African-American female in no acute distress recommended therapist and sitting at the edge of the bed Respiratory: Diminished to auscultation bilaterally, no wheezing, rales, rhonchi or crackles. Normal respiratory effort and patient is not tachypenic. No accessory muscle use.  Unlabored breathing Cardiovascular: RRR, no murmurs / rubs / gallops. S1 and S2 auscultated. No extremity edema.  Abdomen: Soft, non-tender, distended secondary to body habitus. Bowel sounds positive.  GU: Deferred. Musculoskeletal: No clubbing / cyanosis of digits/nails. No joint deformity upper and lower extremities Skin: No rashes, lesions, ulcers. No induration; Warm and dry.  Neurologic: Is severely dysarthric and has dense hemiplegia with neglect on the left lower  extremity as well as upper extremity.  Moves her left leg sporadically Psychiatric: She is awake and more alert and interactive and appears calm  Data Reviewed: I have personally reviewed following labs and imaging studies  CBC: Recent Labs  Lab 03/11/24 0322 03/13/24 0617  WBC 10.2 9.2  NEUTROABS  --  6.7  HGB 10.7* 10.1*  HCT 34.8* 32.1*  MCV 91.1 89.4  PLT 484* 531*   Basic Metabolic Panel: Recent Labs  Lab  03/12/24 0502 03/13/24 0617  NA 134* 135  K 4.7 4.7  CL 101 102  CO2 25 26  GLUCOSE 129* 129*  BUN 35* 30*  CREATININE 1.00 1.02*  CALCIUM  9.6 9.9  MG  --  2.3  PHOS  --  3.4   GFR: Estimated Creatinine Clearance: 59.3 mL/min (A) (by C-G formula based on SCr of 1.02 mg/dL (H)). Liver Function Tests: Recent Labs  Lab 03/13/24 0617  AST 71*  ALT 81*  ALKPHOS 135*  BILITOT 0.5  PROT 7.4  ALBUMIN  3.1*   No results for input(s): LIPASE, AMYLASE in the last 168 hours. No results for input(s): AMMONIA in the last 168 hours. Coagulation Profile: No results for input(s): INR, PROTIME in the last 168 hours. Cardiac Enzymes: No results for input(s): CKTOTAL, CKMB, CKMBINDEX, TROPONINI in the last 168 hours. BNP (last 3 results) No results for input(s): PROBNP in the last 8760 hours. HbA1C: No results for input(s): HGBA1C in the last 72 hours. CBG: Recent Labs  Lab 03/12/24 2357 03/13/24 0411 03/13/24 0727 03/13/24 1146 03/13/24 1644  GLUCAP 110* 135* 107* 124* 89   Lipid Profile: No results for input(s): CHOL, HDL, LDLCALC, TRIG, CHOLHDL, LDLDIRECT in the last 72 hours. Thyroid  Function Tests: No results for input(s): TSH, T4TOTAL, FREET4, T3FREE, THYROIDAB in the last 72 hours. Anemia Panel: No results for input(s): VITAMINB12, FOLATE, FERRITIN, TIBC, IRON , RETICCTPCT in the last 72 hours. Sepsis Labs: No results for input(s): PROCALCITON, LATICACIDVEN in the last 168 hours.  No results  found for this or any previous visit (from the past 240 hours).   Radiology Studies: DG Swallowing Func-Speech Pathology Result Date: 03/13/2024 Table formatting from the original result was not included. Modified Barium Swallow Study Patient Details Name: ELAINAH RHYNE MRN: [id number] Date of Birth: Aug 29, 1950 Today's Date: 03/13/2024 HPI/PMH: HPI: Noelene Gang is a 73 y.o. female who presented to the hospital from home on 03/01/24 after being found by her son with left sided weakness, slurred speech and facial droop. She was intubated upon arrival to ER. CXR did not show acute abnormality, CT chest did not show any focal consolidation or PE. 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 She was extubated on 03/02/24 at 1305 to oxygen via [place]. PMH: HTN, severe aortic stenosis, nonischemic cardiomyopathy, CKD stage IIIb, schizophrenia. MBSS completed on 12/29 with recommendation to continue NPO with the exception of puree snacks. Attempted MBS repeat MBS 1/5 however she refused. Pt accepting po at bedside and repeat MBS today for possible initiation of po. Clinical Impression: Clinical Impression: Pt's oropharyngeal swallow function has improved from prior study with significantly decreased aspiration episodes with deficits in swallow safety versus efficiency. She continues with reduced labial seal, lingual control of thin spilling to vocal cords with trace aspiration during second sip with reflexive cough unable to determine if clearance achieved. Following cough there was retrograde movement into pyriforms from PES. She has disorganized lingual propulsion and lingual movements. Pharyngeally although there is reduced anterior hyoid excursion and intermittent partial epiglottic excursion her laryngeal elevation and glottic closure appear adequate. Her primary impairment is poor timing and onset of swallow with frequent entrance of barium into vestibule prior to  swallow initiation with thin and nectar. No sensation of nectar to vocal cords although not visible on following frame. Flash penetration with honey and puree with good efficiency of swallow and no significant pharyngeal residue. Esophageal scan revealed barium in distal esophagus with mild  retrograde movement. Recommend initiate puree, honey thick liquids, pills crushed, check for pocketing, full supervision and assist due to cognitive deficits and stay upright after meals. ST to follow. Factors that may increase risk of adverse event in presence of aspiration Noe & Lianne 2021): Factors that may increase risk of adverse event in presence of aspiration Noe & Lianne 2021): Reduced cognitive function; Limited mobility Recommendations/Plan: Swallowing Evaluation Recommendations Swallowing Evaluation Recommendations Recommendations: PO diet PO Diet Recommendation: Dysphagia 1 (Pureed); Moderately thick liquids (Level 3, honey thick) Liquid Administration via: Cup Medication Administration: Crushed with puree Supervision: Staff to assist with self-feeding; Full supervision/cueing for swallowing strategies Swallowing strategies  : Slow rate; Small bites/sips; Check for pocketing or oral holding Postural changes: Position pt fully upright for meals; Stay upright 30-60 min after meals Oral care recommendations: Oral care BID (2x/day) Treatment Plan Treatment Plan Treatment recommendations: Therapy as outlined in treatment plan below Follow-up recommendations: Skilled nursing-short term rehab (<3 hours/day) Functional status assessment: Patient has had a recent decline in their functional status and demonstrates the ability to make significant improvements in function in a reasonable and predictable amount of time. Treatment frequency: Min 2x/week Treatment duration: 2 weeks Interventions: Compensatory techniques; Patient/family education; Trials of upgraded texture/liquids; Diet toleration management by SLP  Recommendations Recommendations for follow up therapy are one component of a multi-disciplinary discharge planning process, led by the attending physician.  Recommendations may be updated based on patient status, additional functional criteria and insurance authorization. Assessment: Orofacial Exam: Orofacial Exam Oral Cavity: Oral Hygiene: WFL Oral Cavity - Dentition: Edentulous; Dentures, not available Orofacial Anatomy: Other (comment) Oral Motor/Sensory Function: Suspected cranial nerve impairment CN V - Trigeminal: Left motor impairment CN VII - Facial: Left motor impairment Anatomy: Anatomy: WFL Boluses Administered: Boluses Administered Boluses Administered: Thin liquids (Level 0); Mildly thick liquids (Level 2, nectar thick); Moderately thick liquids (Level 3, honey thick); Puree  Oral Impairment Domain: Oral Impairment Domain Lip Closure: Escape progressing to mid-chin Tongue control during bolus hold: Posterior escape of less than half of bolus Bolus preparation/mastication: -- (NT) Bolus transport/lingual motion: Repetitive/disorganized tongue motion Oral residue: Residue collection on oral structures Location of oral residue : Floor of mouth; Tongue Initiation of pharyngeal swallow : Posterior laryngeal surface of the epiglottis  Pharyngeal Impairment Domain: Pharyngeal Impairment Domain Soft palate elevation: No bolus between soft palate (SP)/pharyngeal wall (PW) Laryngeal elevation: Complete superior movement of thyroid  cartilage with complete approximation of arytenoids to epiglottic petiole (material only present due to poor timing of elevation) Anterior hyoid excursion: Partial anterior movement Epiglottic movement: Partial inversion Laryngeal vestibule closure: Complete, no air/contrast in laryngeal vestibule (material present due to poor timing and onset) Pharyngeal stripping wave : Present - complete Pharyngeal contraction (A/P view only): N/A Pharyngoesophageal segment opening: Partial  distention/partial duration, partial obstruction of flow (prominent cricopharyngeus with honey, retrograde movement wtih thin into pyriforms x 1) Tongue base retraction: Trace column of contrast or air between tongue base and PPW Pharyngeal residue: Trace residue within or on pharyngeal structures Location of pharyngeal residue: Valleculae (several instances, mostly clears pharynx)  Esophageal Impairment Domain: Esophageal Impairment Domain Esophageal clearance upright position: Esophageal retention Pill: No data recorded Penetration/Aspiration Scale Score: Penetration/Aspiration Scale Score 2.  Material enters airway, remains ABOVE vocal cords then ejected out: Puree; Moderately thick liquids (Level 3, honey thick); Mildly thick liquids (Level 2, nectar thick) 5.  Material enters airway, CONTACTS cords and not ejected out: Mildly thick liquids (Level 2, nectar thick) 7.  Material enters airway,  passes BELOW cords and not ejected out despite cough attempt by patient: Thin liquids (Level 0); Mildly thick liquids (Level 2, nectar thick); Moderately thick liquids (Level 3, honey thick) Compensatory Strategies: Compensatory Strategies Compensatory strategies: No   General Information: Caregiver present: No  Diet Prior to this Study: NPO   Temperature : Normal   Respiratory Status: WFL   Supplemental O2: None (Room air)   History of Recent Intubation: Yes  Behavior/Cognition: Alert; Cooperative; Confused; Distractible; Requires cueing Self-Feeding Abilities: Needs assist with self-feeding Baseline vocal quality/speech: Normal Volitional Cough: Unable to elicit Volitional Swallow: Unable to elicit Exam Limitations: No limitations Goal Planning: Prognosis for improved oropharyngeal function: -- (fair-good) Barriers to Reach Goals: Cognitive deficits No data recorded No data recorded Consulted and agree with results and recommendations: Patient; Nurse Pain: Pain Assessment Pain Assessment: No/denies pain Faces Pain Scale: 0  Breathing: 0 Negative Vocalization: 0 Facial Expression: 0 Body Language: 0 Consolability: 0 PAINAD Score: 0 End of Session: Start Time:SLP Start Time (ACUTE ONLY): 1405 Stop Time: SLP Stop Time (ACUTE ONLY): 1417 Time Calculation:SLP Time Calculation (min) (ACUTE ONLY): 12 min Charges: SLP Evaluations $ SLP Speech Visit: 1 Visit SLP Evaluations $MBS Swallow: 1 Procedure $Swallowing Treatment: 1 Procedure SLP visit diagnosis: SLP Visit Diagnosis: Dysphagia, oropharyngeal phase (R13.12) Past Medical History: Past Medical History: Diagnosis Date  Chronic back pain   Chronic kidney disease (CKD), stage II (mild)   Class I-II  Coronary artery disease 04/2009  50% stenosis in the perforator of LAD; catheterization was for an abnormal Myoview in January 2000 showing anterior and inferolateral ischemia.  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 > 3 subsequent Echos show improved/normal EF with moderate to severe concentric LVH and diastolic dysfunction with LVOT/intracavitary gradient --> 06/2016: Severe LVH.  Vigorous EF, 65-70%.?? Gr 1 DD. Mild AS.  Hyperlipidemia   Hypertension   Hypertensive hypertrophic cardiomyopathy: NYHA class II:  Echo: Severe concentric LVH with LV OT gradient; essentially preserved EF with diastolic dysfunction 02/15/2013  Echo 06/2016: Severe Concentric LVH. Vigorous EF 65-70%. ~ Gr I DD.   Mild aortic stenosis by prior echocardiography   Echo 06/2016: Mild AS (Mean Gradient 15 mmHg); has had prior Mod-Severe MR (not seen on current echo)  PAD (peripheral artery disease) March 2013  Lower extremity Dopplers: R. SFA 50-60%, R. PTA proximally occluded with distal reconstitution;; L. common iliac ~50%, L. SFA 50-70% stenosis, L. PTA < 50%  Schizophrenia (HCC)  Past Surgical History: Past Surgical History: Procedure Laterality Date  BUNIONECTOMY    carotid doppler  05/29/2011  left bulb/prox  ICA moderate amtfibrous plaque with no evidence significant reduction.,right bulb /proximal ICA normal patency  lower extremity doppler  05/29/2011  right SFA 50% to 59% diameter reduction,right posterior tibal atreery occlusive disease,reconstituting distally, left common illiac<50%,left SFA 50 to70%,left post. tibial <50%  NM MYOCAR PERF WALL MOTION  03/2009  Persantine; EF 51%-both anterior and inferolateral ischemia  TRANSTHORACIC ECHOCARDIOGRAM  06/2016  Severe LVH.  Vigorous EF of 65-70%.  No RWMA. ~Only grade 1 diastolic dysfunction.  Mild aortic stenosis (mean gradient 15 mmHg)  TRANSTHORACIC ECHOCARDIOGRAM  07/2012  EF 50-55%; severe concentric LVH; only grade 1 diastolic dysfunction. Mild aortic sclerosis - with LVOT /intracavitary gradient of roughly 20 mmHg mean. Mild to moderately dilated LA;; previously reported MR not seen Dustin Olam Bull 03/13/2024, 6:42 PM  Scheduled Meds:  amLODipine   10 mg  Per Tube Daily   apixaban   5 mg Per Tube BID   [START ON 03/18/2024] apixaban   5 mg Per Tube BID   [START ON 03/18/2024] aspirin   81 mg Per NG tube Daily   atorvastatin   80 mg Per Tube Daily   carvedilol   12.5 mg Per Tube BID   docusate  100 mg Per Tube BID   free water   200 mL Per Tube Q4H   insulin  aspart  0-15 Units Subcutaneous Q4H   nutrition supplement (JUVEN)  1 packet Per Tube BID BM   OLANZapine   7.5 mg Per Tube QHS   mouth rinse  15 mL Mouth Rinse 4 times per day   polyethylene glycol  17 g Per Tube Daily   Continuous Infusions:  [START ON 03/17/2024]  ceFAZolin  (ANCEF ) IV     feeding supplement (OSMOLITE 1.2 CAL) 1,000 mL (03/12/24 1021)    LOS: 12 days   Alejandro Marker, DO [facility] Available via Epic secure chat 7am-7pm After these hours, please refer to coverage provider listed on [email or web address] 03/13/2024, 6:55 PM  "

## 2024-03-13 NOTE — Plan of Care (Signed)
" °  Problem: Fluid Volume: Goal: Ability to maintain a balanced intake and output will improve 03/13/2024 1741 by Bluford Cree R, LPN Outcome: Progressing 03/13/2024 1740 by Bluford Cree R, LPN Outcome: Progressing   Problem: Metabolic: Goal: Ability to maintain appropriate glucose levels will improve 03/13/2024 1741 by Bluford Cree SAUNDERS, LPN Outcome: Progressing 03/13/2024 1740 by Bluford Cree R, LPN Outcome: Progressing   Problem: Nutritional: Goal: Maintenance of adequate nutrition will improve 03/13/2024 1741 by Bluford Cree SAUNDERS, LPN Outcome: Progressing 03/13/2024 1740 by Bluford Cree SAUNDERS, LPN Outcome: Progressing Goal: Progress toward achieving an optimal weight will improve 03/13/2024 1741 by Bluford Cree SAUNDERS, LPN Outcome: Progressing 03/13/2024 1740 by Bluford Cree R, LPN Outcome: Progressing   Problem: Skin Integrity: Goal: Risk for impaired skin integrity will decrease 03/13/2024 1741 by Bluford Cree SAUNDERS, LPN Outcome: Progressing 03/13/2024 1740 by Bluford Cree SAUNDERS, LPN Outcome: Progressing   "

## 2024-03-13 NOTE — Progress Notes (Signed)
 Modified Barium Swallow Study  Patient Details  Name: Erin Riley MRN: [id number] Date of Birth: 06-22-1950  Today's Date: 03/13/2024  Modified Barium Swallow completed.  Full report located under Chart Review in the Imaging Section.  History of Present Illness Erin Riley is a 74 y.o. female who presented to the hospital from home on 03/01/24 after being found by her son with left sided weakness, slurred speech and facial droop. She was intubated upon arrival to ER. CXR did not show acute abnormality, CT chest did not show any focal consolidation or PE. 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 She was extubated on 03/02/24 at 1305 to oxygen via [place]. PMH: HTN, severe aortic stenosis, nonischemic cardiomyopathy, CKD stage IIIb, schizophrenia. MBSS completed on 12/29 with recommendation to continue NPO with the exception of puree snacks. Attempted MBS repeat MBS 1/5 however she refused. Pt accepting po at bedside and repeat MBS today for possible initiation of po.   Clinical Impression Pt's oropharyngeal swallow function has improved from prior study with significantly decreased aspiration episodes with deficits in swallow safety versus efficiency. She continues with reduced labial seal, lingual control of thin spilling to vocal cords with trace aspiration during second sip with reflexive cough unable to determine if clearance achieved. Following cough there was retrograde movement into pyriforms from PES. She has disorganized lingual propulsion and lingual movements. Pharyngeally although there is reduced anterior hyoid excursion and intermittent partial epiglottic excursion her laryngeal elevation and glottic closure appear adequate. Her primary impairment is poor timing and onset of swallow with frequent entrance of barium into vestibule prior to swallow initiation with thin and nectar. No sensation of nectar to vocal cords although not  visible on following frame. Flash penetration with honey and puree with good efficiency of swallow and no significant pharyngeal residue. Esophageal scan revealed barium in distal esophagus with mild retrograde movement. Recommend initiate puree, honey thick liquids, pills crushed, check for pocketing, full supervision and assist due to cognitive deficits and stay upright after meals. ST to follow. Factors that may increase risk of adverse event in presence of aspiration Noe & Lianne 2021): Reduced cognitive function;Limited mobility  Swallow Evaluation Recommendations Recommendations: PO diet PO Diet Recommendation: Dysphagia 1 (Pureed);Moderately thick liquids (Level 3, honey thick) Liquid Administration via: Cup Medication Administration: Crushed with puree Supervision: Staff to assist with self-feeding;Full supervision/cueing for swallowing strategies Swallowing strategies  : Slow rate;Small bites/sips;Check for pocketing or oral holding Postural changes: Position pt fully upright for meals;Stay upright 30-60 min after meals Oral care recommendations: Oral care BID (2x/day)      Dustin Olam Bull 03/13/2024,6:42 PM

## 2024-03-13 NOTE — Plan of Care (Signed)
   Problem: Coping: Goal: Ability to adjust to condition or change in health will improve Outcome: Progressing   Problem: Fluid Volume: Goal: Ability to maintain a balanced intake and output will improve Outcome: Progressing   Problem: Nutritional: Goal: Maintenance of adequate nutrition will improve Outcome: Progressing Goal: Progress toward achieving an optimal weight will improve Outcome: Progressing

## 2024-03-13 NOTE — Progress Notes (Signed)
 Physical Therapy Treatment Patient Details Name: Erin Riley MRN: [id number] DOB: 31-Aug-1950 Today's Date: 03/13/2024   History of Present Illness Pt is a 73 y/o F admitted 03/01/24 after being found down by her son with left sided weakness, slurred speech and facial droop, also intermittent episodes of apnea. 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 She was intubated 12/27-28/25. PMH includes HTN, hypertrophic cardiomyopathy, PAD, CKD III, HLD, schizophrenia, AAA.    PT Comments  Pt resting in bed on arrival, restless and eager for mobility. Pt able to come to sitting EOB with min A to manage Les and trunk and max multimodal cues to scoot around to square hips at EOB. Pt unable to come to standing this session despite dense cues for initiation and sequencing and multiple options for UE support. Pt becoming frustrated and returning to trunk supine after ~59mins. Bed placed in modified chair position at end of session. Pt continues to benefit from skilled PT services to progress toward functional mobility goals.     If plan is discharge home, recommend the following: Two people to help with walking and/or transfers;Two people to help with bathing/dressing/bathroom;Assistance with cooking/housework;Assistance with feeding;Direct supervision/assist for medications management;Assist for transportation;Direct supervision/assist for financial management;Supervision due to cognitive status   Can travel by Doctor, Hospital (measurements PT);Wheelchair cushion (measurements PT);Hospital bed;Hoyer lift    Recommendations for Other Services       Precautions / Restrictions Precautions Precautions: Fall Recall of Precautions/Restrictions: Impaired Precaution/Restrictions Comments: L inattention, cortrak Restrictions Weight Bearing Restrictions Per Provider Order:  No     Mobility  Bed Mobility Overal bed mobility: Needs Assistance Bed Mobility: Supine to Sit, Sit to Supine     Supine to sit: Min assist, HOB elevated Sit to supine: Min assist, HOB elevated   General bed mobility comments: min A to being LEs to and off EOB and to elevate trunk, dense cues to scoot around to square hips at EOB    Transfers Overall transfer level: Needs assistance Equipment used: 1 person hand held assist Transfers: Sit to/from Stand             General transfer comment: increased time spent attempting to have pt initaite to stand with max multimodal cues with HHA, RW, table back of chair placed in front of pt for UE assist, pt unable to motor plan to perform, pt becoming frustrated and returning to supine    Ambulation/Gait               General Gait Details: unable at this time   Stairs             Wheelchair Mobility     Tilt Bed    Modified Rankin (Stroke Patients Only) Modified Rankin (Stroke Patients Only) Pre-Morbid Rankin Score: No symptoms Modified Rankin: Severe disability     Balance Overall balance assessment: Needs assistance Sitting-balance support: Single extremity supported, Feet supported Sitting balance-Leahy Scale: Fair Sitting balance - Comments: verbal and physical cues for R hand placement. Pt sat EOB x ~15 minutes with CGA/min A to maintain balance during grooming/hygiene, UB ADLs and L UE PROM Postural control: Right lateral lean                                  Communication Communication Communication:  Impaired Factors Affecting Communication: Difficulty expressing self;Reduced clarity of speech  Cognition Arousal: Alert Behavior During Therapy: Impulsive, Restless   PT - Cognitive impairments: Awareness, Memory, Attention, Initiation, Sequencing, Problem solving, Safety/Judgement                       PT - Cognition Comments: restless throughout, needing cues for  safety Following commands: Impaired Following commands impaired: Follows one step commands inconsistently    Cueing Cueing Techniques: Verbal cues, Gestural cues, Tactile cues, Visual cues  Exercises Other Exercises Other Exercises: Pt tolerated PROM of L UE sitting EOB    General Comments        Pertinent Vitals/Pain      Home Living                          Prior Function            PT Goals (current goals can now be found in the care plan section) Acute Rehab PT Goals Patient Stated Goal: none stated this session PT Goal Formulation: With patient/family Time For Goal Achievement: 03/17/24 Progress towards PT goals: Progressing toward goals    Frequency    Min 3X/week      PT Plan      Co-evaluation              AM-PAC PT 6 Clicks Mobility   Outcome Measure  Help needed turning from your back to your side while in a flat bed without using bedrails?: A Lot Help needed moving from lying on your back to sitting on the side of a flat bed without using bedrails?: A Lot Help needed moving to and from a bed to a chair (including a wheelchair)?: Total Help needed standing up from a chair using your arms (e.g., wheelchair or bedside chair)?: Total Help needed to walk in hospital room?: Total Help needed climbing 3-5 steps with a railing? : Total 6 Click Score: 8    End of Session   Activity Tolerance: Patient tolerated treatment well Patient left: in bed;with call bell/phone within reach;with bed alarm set;Other (comment) (with bed in parital chair position) Nurse Communication: Mobility status PT Visit Diagnosis: Other abnormalities of gait and mobility (R26.89);Muscle weakness (generalized) (M62.81);Other symptoms and signs involving the nervous system (R29.898);Hemiplegia and hemiparesis Hemiplegia - Right/Left: Left Hemiplegia - dominant/non-dominant: Non-dominant Hemiplegia - caused by: Cerebral infarction     Time: 1030-1048 PT Time  Calculation (min) (ACUTE ONLY): 18 min  Charges:    $Therapeutic Activity: 8-22 mins PT General Charges $$ ACUTE PT VISIT: 1 Visit                     Adrien Dietzman R. PTA Acute Rehabilitation Services Office: [phone]   Erin Riley 03/13/2024, 12:53 PM

## 2024-03-13 NOTE — Progress Notes (Signed)
 Speech Language Pathology Treatment: Dysphagia  Patient Details Name: Erin Riley MRN: [id number] DOB: [birth year] Today's Date: 03/13/2024 Time: 8842-8792 SLP Time Calculation (min) (ACUTE ONLY): 10 min  Assessment / Plan / Recommendation Clinical Impression  MBS scheduled for 2:00 today.   Pt confused, distractible but redirectable and eager to accept applesauce and multiple sips thin via straw. Oral transit appeared timely from observation. No s/s aspiration however s/p stroke and unable to assess pharyngeal phase without instrumental assessment. Attempted MBS last week and pt refused to accept po. MBS today at 2:00 with hopeful improved cooperation and ability to assess swallow function.    HPI HPI: Erin Riley is a 74 y.o. female who presented to the hospital from home on 03/01/24 after being found by her son with left sided weakness, slurred speech and facial droop. She was intubated upon arrival to ER. CXR did not show acute abnormality, CT chest did not show any focal consolidation or PE. 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 She was extubated on 03/02/24 at 1305 to oxygen via [facility]. PMH: HTN, severe aortic stenosis, nonischemic cardiomyopathy, CKD stage IIIb, schizophrenia. MBSS completed on 12/29 with recommendation to continue NPO with the exception of puree snacks.      SLP Plan  MBS        Swallow Evaluation Recommendations   Recommendations: NPO     Recommendations                     Oral care QID;Staff/trained caregiver to provide oral care   Frequent or constant Supervision/Assistance Dysphagia, oropharyngeal phase (R13.12)     MBS     Dustin Olam Bull  03/13/2024, 12:14 PM

## 2024-03-14 DIAGNOSIS — R1310 Dysphagia, unspecified: Secondary | ICD-10-CM | POA: Diagnosis not present

## 2024-03-14 DIAGNOSIS — I639 Cerebral infarction, unspecified: Secondary | ICD-10-CM | POA: Diagnosis not present

## 2024-03-14 DIAGNOSIS — I10 Essential (primary) hypertension: Secondary | ICD-10-CM | POA: Diagnosis not present

## 2024-03-14 DIAGNOSIS — R471 Dysarthria and anarthria: Secondary | ICD-10-CM | POA: Diagnosis not present

## 2024-03-14 LAB — COMPREHENSIVE METABOLIC PANEL WITH GFR
ALT: 69 U/L — ABNORMAL HIGH (ref 0–44)
AST: 60 U/L — ABNORMAL HIGH (ref 15–41)
Albumin: 3.1 g/dL — ABNORMAL LOW (ref 3.5–5.0)
Alkaline Phosphatase: 140 U/L — ABNORMAL HIGH (ref 38–126)
Anion gap: 10 (ref 5–15)
BUN: 36 mg/dL — ABNORMAL HIGH (ref 8–23)
CO2: 24 mmol/L (ref 22–32)
Calcium: 9.7 mg/dL (ref 8.9–10.3)
Chloride: 102 mmol/L (ref 98–111)
Creatinine, Ser: 1.06 mg/dL — ABNORMAL HIGH (ref 0.44–1.00)
GFR, Estimated: 56 mL/min — ABNORMAL LOW
Glucose, Bld: 102 mg/dL — ABNORMAL HIGH (ref 70–99)
Potassium: 4.7 mmol/L (ref 3.5–5.1)
Sodium: 136 mmol/L (ref 135–145)
Total Bilirubin: 0.5 mg/dL (ref 0.0–1.2)
Total Protein: 7.6 g/dL (ref 6.5–8.1)

## 2024-03-14 LAB — CBC WITH DIFFERENTIAL/PLATELET
Abs Immature Granulocytes: 0.05 K/uL (ref 0.00–0.07)
Basophils Absolute: 0 K/uL (ref 0.0–0.1)
Basophils Relative: 0 %
Eosinophils Absolute: 0.2 K/uL (ref 0.0–0.5)
Eosinophils Relative: 3 %
HCT: 32.6 % — ABNORMAL LOW (ref 36.0–46.0)
Hemoglobin: 10.2 g/dL — ABNORMAL LOW (ref 12.0–15.0)
Immature Granulocytes: 1 %
Lymphocytes Relative: 14 %
Lymphs Abs: 1.4 K/uL (ref 0.7–4.0)
MCH: 28.3 pg (ref 26.0–34.0)
MCHC: 31.3 g/dL (ref 30.0–36.0)
MCV: 90.6 fL (ref 80.0–100.0)
Monocytes Absolute: 0.8 K/uL (ref 0.1–1.0)
Monocytes Relative: 8 %
Neutro Abs: 7 K/uL (ref 1.7–7.7)
Neutrophils Relative %: 74 %
Platelets: 559 K/uL — ABNORMAL HIGH (ref 150–400)
RBC: 3.6 MIL/uL — ABNORMAL LOW (ref 3.87–5.11)
RDW: 14.6 % (ref 11.5–15.5)
WBC: 9.4 K/uL (ref 4.0–10.5)
nRBC: 0 % (ref 0.0–0.2)

## 2024-03-14 LAB — IRON AND TIBC
Iron: 27 ug/dL — ABNORMAL LOW (ref 28–170)
Saturation Ratios: 10 % — ABNORMAL LOW (ref 10.4–31.8)
TIBC: 258 ug/dL (ref 250–450)
UIBC: 231 ug/dL

## 2024-03-14 LAB — GLUCOSE, CAPILLARY
Glucose-Capillary: 107 mg/dL — ABNORMAL HIGH (ref 70–99)
Glucose-Capillary: 112 mg/dL — ABNORMAL HIGH (ref 70–99)
Glucose-Capillary: 128 mg/dL — ABNORMAL HIGH (ref 70–99)
Glucose-Capillary: 131 mg/dL — ABNORMAL HIGH (ref 70–99)
Glucose-Capillary: 136 mg/dL — ABNORMAL HIGH (ref 70–99)
Glucose-Capillary: 83 mg/dL (ref 70–99)

## 2024-03-14 LAB — RETICULOCYTES
Immature Retic Fract: 21.5 % — ABNORMAL HIGH (ref 2.3–15.9)
RBC.: 3.6 MIL/uL — ABNORMAL LOW (ref 3.87–5.11)
Retic Count, Absolute: 79.9 K/uL (ref 19.0–186.0)
Retic Ct Pct: 2.2 % (ref 0.4–3.1)

## 2024-03-14 LAB — PHOSPHORUS: Phosphorus: 3.5 mg/dL (ref 2.5–4.6)

## 2024-03-14 LAB — MAGNESIUM: Magnesium: 2.2 mg/dL (ref 1.7–2.4)

## 2024-03-14 LAB — FERRITIN: Ferritin: 344 ng/mL — ABNORMAL HIGH (ref 11–307)

## 2024-03-14 LAB — FOLATE: Folate: 8.7 ng/mL

## 2024-03-14 LAB — VITAMIN B12: Vitamin B-12: 1503 pg/mL — ABNORMAL HIGH (ref 180–914)

## 2024-03-14 MED ORDER — OLANZAPINE 5 MG PO TABS
5.0000 mg | ORAL_TABLET | Freq: Every day | ORAL | Status: DC
  Administered 2024-03-14 – 2024-03-16 (×3): 5 mg
  Filled 2024-03-14 (×4): qty 1

## 2024-03-14 MED ORDER — OSMOLITE 1.5 CAL PO LIQD
1000.0000 mL | ORAL | Status: DC
  Administered 2024-03-14 – 2024-03-16 (×3): 1000 mL
  Filled 2024-03-14 (×5): qty 1000

## 2024-03-14 NOTE — Progress Notes (Incomplete)
 " PROGRESS NOTE    Erin Riley  FMW:[id number] DOB: 1951/01/28 DOA: 03/01/2024 PCP: [name], MD   Brief Narrative:  Erin Riley is a 74 yo female with PMH CAD, CKD, HLD, HTN, PAD, schizophrenia, severe AS who was found on the floor at home.  She had dense left-sided weakness, slurred speech, facial droop.  She required intubation upon arrival to the ER.  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  MRI brain showed acute right MCA territory infarct with 2 mm leftward midline shift.  She was able to be weaned off pressors and extubated.  Also evaluated by vascular surgery.  Subsequently she is not able to take any oral intake and has a core track.  Given her inability to advance her diet initially PEG tube was recommended with tentative plan of placement on 03/17/2024 per the IR team. MBS done and now on D1 Honey Thick Liquid Diet and will see if she tolerates.   PT/OT recommending CIR but may not be a candidate and may need to pursue SNF and other venues.  Assessment and Plan:   Subacute R MCA CVA  Dysphagia as a result of CVA -Presented outside of window for intervention. Etiology felt most likely cardioembolic  - Appreciate stroke team's management.  Agree that stroke was likely cardioembolic, although no LV thrombus seen on echo -SBP goal is complex:  enlarging aortic aneurysms, hx AS -- need to avoid significant HTN with rupture risk. Goal SBP 100-140  -Cortrak; continue TF for now; SLP will continue following -Unable to perform MBS on 1/5 due to her agitation and unable to follow prompts; will discuss with family about placing PEG tube at this point as she is likely to need and if does happen to progress in the future, can be removed; do not think  family wants to be anything other than aggressive at this point -potential CIR candidate; has significant dense left hemiplegia with neglect; if declined, will need SNF pursuit after PEG placement; per to the inpatient rehabilitation coordinator she has low tolerance for and slow progression with therapy and they are likely cannot recommend other venues but we will continue to check on her in house -C/w Atorvastatin  80 mg per Tube Daily for now but may need to hold given Abnormal LFTs - Asa on hold 1/6 - 1/12 in anticipation of PEG on 1/12, then resume ASA 1/13 -Continue Eliquis  thru 1/9; hold 48 hrs for PEG on 1/12 and resume on 1/13 - IR requesting repeat SLP eval prior to PEG.  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  -not a candidate for open or endovascular repair at this time. See VVS note 12/28 for details   Hx NICM  HFrEF -- LVEF 25-30%  Severe AS -Not a TAVR candidate  -GDMT limited at this time w renal dysfxn; Holding w/ ASA 81 mg per NG Tube Daily and resuming 1/13; C/w Atrovastatin 80 mg per Tube Daily, and Carvedilol  12.5 mg per Tube BID   AKI on CKD3b, improving  Elevated CK, improving  -D/C'd Foley;  Renal function improving and back to baseline. BUN/Cr Trend: Recent Labs  Lab 03/02/24 0427 03/03/24 0448 03/04/24 0458 03/05/24 0407 03/06/24 0450 03/12/24 0502  03/13/24 0617  BUN 57* 46* 39* 36* 33* 35* 30*  CREATININE 3.32* 2.14* 1.72* 1.63* 1.41* 1.00 1.02*  -IVF now stopped and getting TF -Avoid Nephrotoxic Medications, Contrast Dyes, Hypotension and Dehydration to Ensure Adequate Renal Perfusion and will need to Renally Adjust Meds -Continue to Monitor and Trend Renal Function carefully and repeat CMP in the AM   Abnormal LFTs: AST and ALT Trend:  Recent Labs  Lab 03/01/24 1517 03/13/24 0617  AST 34 71*  ALT 16 81*  -Likely reactive. CTM and Trend and may need to hold Atorvastatin  per Tube given Elevation.   Hypernatremia -> Hyponatremia: Improved. Na+ Trend:  Recent Labs  Lab 03/02/24 0538 03/03/24 0448 03/04/24 0458 03/05/24 0407 03/06/24 0450 03/12/24 0502 03/13/24 0617  NA 139 145 148* 150* 150* 134* 135  -CTM and Trend and repeat CMP in the AM    DM2 w Hyperglycemia: C/w Moderate Novolog  SSI q4h. CTM CBGs per Protocol. CBG Trend:  Recent Labs  Lab 03/12/24 1610 03/12/24 2024 03/12/24 2357 03/13/24 0411 03/13/24 0727 03/13/24 1146 03/13/24 1644  GLUCAP 114* 135* 110* 135* 107* 124* 89   Leukocytosis + low grade temp: Resolved on last check. Some upper lobe rhonchi, plausible aspirations  -MRSA PCR neg. Had a foley but removed. Augmentin  course completed. WBC Trend:  Recent Labs  Lab 03/02/24 0427 03/03/24 0448 03/04/24 0458 03/05/24 0407 03/06/24 0450 03/11/24 0322 03/13/24 0617  WBC 12.4* 17.2* 14.1* 14.2* 11.7* 10.2 9.2   Normocytic Anemia: Hgb/Hct Trend fairly stable:  Recent Labs  Lab 03/02/24 0538 03/03/24 0448 03/04/24 0458 03/05/24 0407 03/06/24 0450 03/11/24 0322 03/13/24 0617  HGB 9.9* 10.3* 10.3* 10.5* 10.5* 10.7* 10.1*  HCT 29.0* 33.0* 33.4* 33.5* 34.8* 34.8* 32.1*  MCV  --  91.2 92.3 93.6 94.1 91.1 89.4  -Check Anemia Panel in the AM. CTM for S/Sx of Bleeding; No overt bleeding noted. Repeat CBC in the AM   Thrombocytosis: Mild and in the setting of above. Plt Count Trend:  Recent  Labs  Lab 03/02/24 0427 03/03/24 0448 03/04/24 0458 03/05/24 0407 03/06/24 0450 03/11/24 0322 03/13/24 0617  PLT 208 183 189 231 280 484* 531*  -CTM and Trend and repeat CBC In the AM  Non-Severe (Moderate) Malnutrition in the context of chronic illness: Nutrition Status: Nutrition Problem: Moderate Malnutrition Etiology: chronic illness Signs/Symptoms: mild fat depletion, mild muscle depletion Interventions: Refer to RD note for recommendations - Currently getting tube feedings via core track with Osmolite 1.2 at 60 mL/h with free water  flushes 200 mL every 4 hours.  The nutritionist has added Juven twice daily and are recommending PEG tube placement given her inability to advance her diet orally  Hypoalbuminemia: Patient's Albumin  Level went from 4.0 -> 3.1. CTM & Trend & repeat CMP in the AM  Class I Obesity: Complicates overall prognosis and care. Estimated body mass index is 33.15 kg/m as calculated from the following:   Height as of this encounter: 5' 7 (1.702 m).   Weight as of this encounter: 96 kg.  Weight Loss and Dietary Counseling given     DVT prophylaxis: SCDs Start: 03/01/24 1745 apixaban  (ELIQUIS ) tablet 5 mg  apixaban  (ELIQUIS ) tablet 5 mg    Code Status: Full Code Family Communication:   Disposition Plan:  Level of care: Med-Surg Status is: Inpatient {Inpatient:23812}    Consultants:  ***  Procedures:  ***  Antimicrobials:  Anti-infectives (From admission, onward)    Start     Dose/Rate Route Frequency Ordered Stop   03/17/24 0800  ceFAZolin  (ANCEF ) IVPB 2g/100 mL premix        2 g 200 mL/hr over 30 Minutes Intravenous To Radiology 03/11/24 1611 03/18/24 0800   03/05/24 1130  amoxicillin -clavulanate (AUGMENTIN ) 875-125 MG per tablet 1 tablet        1 tablet Per Tube Every 12 hours 03/05/24 1036 03/07/24 2101   03/03/24 0930  cefTRIAXone  (ROCEPHIN ) 2 g in sodium chloride  0.9 % 100 mL IVPB  Status:  Discontinued        2 g 200 mL/hr over 30  Minutes Intravenous Every 24 hours 03/03/24 0838 03/05/24 1036        Subjective: ***  Objective: Vitals:   03/14/24 0323 03/14/24 0546 03/14/24 0741 03/14/24 1231  BP: (!) 139/98  128/84 114/69  Pulse: 72  72 71  Resp: 19  20 18   Temp: 97.8 F (36.6 C)  98.4 F (36.9 C)   TempSrc: Oral     SpO2: 95%  95% 97%  Weight:  95.8 kg    Height:        Intake/Output Summary (Last 24 hours) at 03/14/2024 1627 Last data filed at 03/14/2024 1400 Gross per 24 hour  Intake 3358 ml  Output 250 ml  Net 3108 ml   Filed Weights   03/12/24 0500 03/13/24 0718 03/14/24 0546  Weight: 96.8 kg 96 kg 95.8 kg    Examination: Physical Exam:  Constitutional: WN/WD, NAD and appears calm and comfortable Eyes: PERRL, lids and conjunctivae normal, sclerae anicteric  ENMT: External Ears, Nose appear normal. Grossly normal hearing. Mucous membranes are moist. Posterior pharynx clear of any exudate or lesions. Normal dentition.  Neck: Appears normal, supple, no cervical masses, normal ROM, no appreciable thyromegaly Respiratory: Clear to auscultation bilaterally, no wheezing, rales, rhonchi or crackles. Normal respiratory effort and patient is not tachypenic. No accessory muscle use.  Cardiovascular: RRR, no murmurs / rubs / gallops. S1 and S2 auscultated. No extremity edema. 2+ pedal pulses. No carotid bruits.  Abdomen: Soft, non-tender, non-distended. No masses palpated. No appreciable hepatosplenomegaly. Bowel sounds positive.  GU: Deferred. Musculoskeletal: No clubbing / cyanosis of digits/nails. No joint deformity upper and lower extremities. Good ROM, no contractures. Normal strength and muscle tone.  Skin: No rashes, lesions, ulcers. No induration; Warm and dry.  Neurologic: CN 2-12 grossly intact with no focal deficits. Sensation intact in all 4 Extremities, DTR normal. Strength 5/5 in all 4. Romberg sign cerebellar reflexes not assessed.  Psychiatric: Normal judgment and insight. Alert and  oriented x 3. Normal mood and appropriate affect.   Data Reviewed: I have personally reviewed following labs and imaging studies  CBC: Recent Labs  Lab 03/11/24 0322 03/13/24 0617 03/14/24 0409  WBC 10.2 9.2 9.4  NEUTROABS  --  6.7 7.0  HGB 10.7* 10.1* 10.2*  HCT 34.8* 32.1* 32.6*  MCV 91.1 89.4 90.6  PLT 484* 531* 559*   Basic Metabolic Panel: Recent Labs  Lab 03/12/24 0502 03/13/24 0617 03/14/24 0409  NA 134* 135 136  K 4.7 4.7  4.7  CL 101 102 102  CO2 25 26 24   GLUCOSE 129* 129* 102*  BUN 35* 30* 36*  CREATININE 1.00 1.02* 1.06*  CALCIUM  9.6 9.9 9.7  MG  --  2.3 2.2  PHOS  --  3.4 3.5   GFR: Estimated Creatinine Clearance: 57 mL/min (A) (by C-G formula based on SCr of 1.06 mg/dL (H)). Liver Function Tests: Recent Labs  Lab 03/13/24 0617 03/14/24 0409  AST 71* 60*  ALT 81* 69*  ALKPHOS 135* 140*  BILITOT 0.5 0.5  PROT 7.4 7.6  ALBUMIN  3.1* 3.1*   No results for input(s): LIPASE, AMYLASE in the last 168 hours. No results for input(s): AMMONIA in the last 168 hours. Coagulation Profile: No results for input(s): INR, PROTIME in the last 168 hours. Cardiac Enzymes: No results for input(s): CKTOTAL, CKMB, CKMBINDEX, TROPONINI in the last 168 hours. BNP (last 3 results) No results for input(s): PROBNP in the last 8760 hours. HbA1C: No results for input(s): HGBA1C in the last 72 hours. CBG: Recent Labs  Lab 03/13/24 1932 03/14/24 0015 03/14/24 0322 03/14/24 0742 03/14/24 1128  GLUCAP 149* 128* 107* 136* 83   Lipid Profile: No results for input(s): CHOL, HDL, LDLCALC, TRIG, CHOLHDL, LDLDIRECT in the last 72 hours. Thyroid  Function Tests: No results for input(s): TSH, T4TOTAL, FREET4, T3FREE, THYROIDAB in the last 72 hours. Anemia Panel: Recent Labs    03/14/24 0409  VITAMINB12 1,503*  FOLATE 8.7  FERRITIN 344*  TIBC 258  IRON  27*  RETICCTPCT 2.2   Sepsis Labs: No results for input(s):  PROCALCITON, LATICACIDVEN in the last 168 hours.  No results found for this or any previous visit (from the past 240 hours).   Radiology Studies: DG Swallowing Func-Speech Pathology Result Date: 03/13/2024 Table formatting from the original result was not included. Modified Barium Swallow Study Patient Details Name: Erin Riley MRN: [id number] Date of Birth: 1950/04/15 Today's Date: 03/13/2024 HPI/PMH: HPI: Erin Riley is a 74 y.o. female who presented to the hospital from home on 03/01/24 after being found by her son with left sided weakness, slurred speech and facial droop. She was intubated upon arrival to ER. CXR did not show acute abnormality, CT chest did not show any focal consolidation or PE. 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 She was extubated on 03/02/24 at 1305 to oxygen via [place]. PMH: HTN, severe aortic stenosis, nonischemic cardiomyopathy, CKD stage IIIb, schizophrenia. MBSS completed on 12/29 with recommendation to continue NPO with the exception of puree snacks. Attempted MBS repeat MBS 1/5 however she refused. Pt accepting po at bedside and repeat MBS today for possible initiation of po. Clinical Impression: Clinical Impression: Pt's oropharyngeal swallow function has improved from prior study with significantly decreased aspiration episodes with deficits in swallow safety versus efficiency. She continues with reduced labial seal, lingual control of thin spilling to vocal cords with trace aspiration during second sip with reflexive cough unable to determine if clearance achieved. Following cough there was retrograde movement into pyriforms from PES. She has disorganized lingual propulsion and lingual movements. Pharyngeally although there is reduced anterior hyoid excursion and intermittent partial epiglottic excursion her laryngeal elevation and glottic closure appear adequate. Her primary impairment is poor timing and onset of  swallow with frequent entrance of barium into vestibule prior to swallow initiation with thin and nectar. No sensation of nectar to vocal cords although not visible on following frame. Flash penetration with honey and puree with good  efficiency of swallow and no significant pharyngeal residue. Esophageal scan revealed barium in distal esophagus with mild retrograde movement. Recommend initiate puree, honey thick liquids, pills crushed, check for pocketing, full supervision and assist due to cognitive deficits and stay upright after meals. ST to follow. Factors that may increase risk of adverse event in presence of aspiration Noe & Lianne 2021): Factors that may increase risk of adverse event in presence of aspiration Noe & Lianne 2021): Reduced cognitive function; Limited mobility Recommendations/Plan: Swallowing Evaluation Recommendations Swallowing Evaluation Recommendations Recommendations: PO diet PO Diet Recommendation: Dysphagia 1 (Pureed); Moderately thick liquids (Level 3, honey thick) Liquid Administration via: Cup Medication Administration: Crushed with puree Supervision: Staff to assist with self-feeding; Full supervision/cueing for swallowing strategies Swallowing strategies  : Slow rate; Small bites/sips; Check for pocketing or oral holding Postural changes: Position pt fully upright for meals; Stay upright 30-60 min after meals Oral care recommendations: Oral care BID (2x/day) Treatment Plan Treatment Plan Treatment recommendations: Therapy as outlined in treatment plan below Follow-up recommendations: Skilled nursing-short term rehab (<3 hours/day) Functional status assessment: Patient has had a recent decline in their functional status and demonstrates the ability to make significant improvements in function in a reasonable and predictable amount of time. Treatment frequency: Min 2x/week Treatment duration: 2 weeks Interventions: Compensatory techniques; Patient/family education; Trials of  upgraded texture/liquids; Diet toleration management by SLP Recommendations Recommendations for follow up therapy are one component of a multi-disciplinary discharge planning process, led by the attending physician.  Recommendations may be updated based on patient status, additional functional criteria and insurance authorization. Assessment: Orofacial Exam: Orofacial Exam Oral Cavity: Oral Hygiene: WFL Oral Cavity - Dentition: Edentulous; Dentures, not available Orofacial Anatomy: Other (comment) Oral Motor/Sensory Function: Suspected cranial nerve impairment CN V - Trigeminal: Left motor impairment CN VII - Facial: Left motor impairment Anatomy: Anatomy: WFL Boluses Administered: Boluses Administered Boluses Administered: Thin liquids (Level 0); Mildly thick liquids (Level 2, nectar thick); Moderately thick liquids (Level 3, honey thick); Puree  Oral Impairment Domain: Oral Impairment Domain Lip Closure: Escape progressing to mid-chin Tongue control during bolus hold: Posterior escape of less than half of bolus Bolus preparation/mastication: -- (NT) Bolus transport/lingual motion: Repetitive/disorganized tongue motion Oral residue: Residue collection on oral structures Location of oral residue : Floor of mouth; Tongue Initiation of pharyngeal swallow : Posterior laryngeal surface of the epiglottis  Pharyngeal Impairment Domain: Pharyngeal Impairment Domain Soft palate elevation: No bolus between soft palate (SP)/pharyngeal wall (PW) Laryngeal elevation: Complete superior movement of thyroid  cartilage with complete approximation of arytenoids to epiglottic petiole (material only present due to poor timing of elevation) Anterior hyoid excursion: Partial anterior movement Epiglottic movement: Partial inversion Laryngeal vestibule closure: Complete, no air/contrast in laryngeal vestibule (material present due to poor timing and onset) Pharyngeal stripping wave : Present - complete Pharyngeal contraction (A/P view  only): N/A Pharyngoesophageal segment opening: Partial distention/partial duration, partial obstruction of flow (prominent cricopharyngeus with honey, retrograde movement wtih thin into pyriforms x 1) Tongue base retraction: Trace column of contrast or air between tongue base and PPW Pharyngeal residue: Trace residue within or on pharyngeal structures Location of pharyngeal residue: Valleculae (several instances, mostly clears pharynx)  Esophageal Impairment Domain: Esophageal Impairment Domain Esophageal clearance upright position: Esophageal retention Pill: No data recorded Penetration/Aspiration Scale Score: Penetration/Aspiration Scale Score 2.  Material enters airway, remains ABOVE vocal cords then ejected out: Puree; Moderately thick liquids (Level 3, honey thick); Mildly thick liquids (Level 2, nectar thick) 5.  Material enters airway, CONTACTS  cords and not ejected out: Mildly thick liquids (Level 2, nectar thick) 7.  Material enters airway, passes BELOW cords and not ejected out despite cough attempt by patient: Thin liquids (Level 0); Mildly thick liquids (Level 2, nectar thick); Moderately thick liquids (Level 3, honey thick) Compensatory Strategies: Compensatory Strategies Compensatory strategies: No   General Information: Caregiver present: No  Diet Prior to this Study: NPO   Temperature : Normal   Respiratory Status: WFL   Supplemental O2: None (Room air)   History of Recent Intubation: Yes  Behavior/Cognition: Alert; Cooperative; Confused; Distractible; Requires cueing Self-Feeding Abilities: Needs assist with self-feeding Baseline vocal quality/speech: Normal Volitional Cough: Unable to elicit Volitional Swallow: Unable to elicit Exam Limitations: No limitations Goal Planning: Prognosis for improved oropharyngeal function: -- (fair-good) Barriers to Reach Goals: Cognitive deficits No data recorded No data recorded Consulted and agree with results and recommendations: Patient; Nurse Pain: Pain  Assessment Pain Assessment: No/denies pain Faces Pain Scale: 0 Breathing: 0 Negative Vocalization: 0 Facial Expression: 0 Body Language: 0 Consolability: 0 PAINAD Score: 0 End of Session: Start Time:SLP Start Time (ACUTE ONLY): 1405 Stop Time: SLP Stop Time (ACUTE ONLY): 1417 Time Calculation:SLP Time Calculation (min) (ACUTE ONLY): 12 min Charges: SLP Evaluations $ SLP Speech Visit: 1 Visit SLP Evaluations $MBS Swallow: 1 Procedure $Swallowing Treatment: 1 Procedure SLP visit diagnosis: SLP Visit Diagnosis: Dysphagia, oropharyngeal phase (R13.12) Past Medical History: Past Medical History: Diagnosis Date  Chronic back pain   Chronic kidney disease (CKD), stage II (mild)   Class I-II  Coronary artery disease 04/2009  50% stenosis in the perforator of LAD; catheterization was for an abnormal Myoview in January 2000 showing anterior and inferolateral ischemia.  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 > 3 subsequent Echos show improved/normal EF with moderate to severe concentric LVH and diastolic dysfunction with LVOT/intracavitary gradient --> 06/2016: Severe LVH.  Vigorous EF, 65-70%.?? Gr 1 DD. Mild AS.  Hyperlipidemia   Hypertension   Hypertensive hypertrophic cardiomyopathy: NYHA class II:  Echo: Severe concentric LVH with LV OT gradient; essentially preserved EF with diastolic dysfunction 02/15/2013  Echo 06/2016: Severe Concentric LVH. Vigorous EF 65-70%. ~ Gr I DD.   Mild aortic stenosis by prior echocardiography   Echo 06/2016: Mild AS (Mean Gradient 15 mmHg); has had prior Mod-Severe MR (not seen on current echo)  PAD (peripheral artery disease) March 2013  Lower extremity Dopplers: R. SFA 50-60%, R. PTA proximally occluded with distal reconstitution;; L. common iliac ~50%, L. SFA 50-70% stenosis, L. PTA < 50%  Schizophrenia (HCC)  Past Surgical History: Past Surgical History: Procedure Laterality  Date  BUNIONECTOMY    carotid doppler  05/29/2011  left bulb/prox ICA moderate amtfibrous plaque with no evidence significant reduction.,right bulb /proximal ICA normal patency  lower extremity doppler  05/29/2011  right SFA 50% to 59% diameter reduction,right posterior tibal atreery occlusive disease,reconstituting distally, left common illiac<50%,left SFA 50 to70%,left post. tibial <50%  NM MYOCAR PERF WALL MOTION  03/2009  Persantine; EF 51%-both anterior and inferolateral ischemia  TRANSTHORACIC ECHOCARDIOGRAM  06/2016  Severe LVH.  Vigorous EF of 65-70%.  No RWMA. ~Only grade 1 diastolic dysfunction.  Mild aortic stenosis (mean gradient 15 mmHg)  TRANSTHORACIC ECHOCARDIOGRAM  07/2012  EF 50-55%; severe concentric LVH; only grade 1 diastolic dysfunction. Mild aortic sclerosis - with LVOT /intracavitary gradient of roughly 20 mmHg mean. Mild to moderately dilated LA;; previously reported  MR not seen Dustin Olam Bull 03/13/2024, 6:42 PM    Scheduled Meds:  amLODipine   10 mg Per Tube Daily   apixaban   5 mg Per Tube BID   [START ON 03/18/2024] apixaban   5 mg Per Tube BID   [START ON 03/18/2024] aspirin   81 mg Per NG tube Daily   atorvastatin   80 mg Per Tube Daily   carvedilol   12.5 mg Per Tube BID   docusate  100 mg Per Tube BID   feeding supplement (OSMOLITE 1.5 CAL)  1,000 mL Per Tube Q24H   free water   200 mL Per Tube Q4H   insulin  aspart  0-15 Units Subcutaneous Q4H   nutrition supplement (JUVEN)  1 packet Per Tube BID BM   OLANZapine   7.5 mg Per Tube QHS   mouth rinse  15 mL Mouth Rinse 4 times per day   polyethylene glycol  17 g Per Tube Daily   Continuous Infusions:  [START ON 03/17/2024]  ceFAZolin  (ANCEF ) IV       LOS: 13 days    Time spent: *** minutes spent on chart review, discussion with nursing staff, consultants, updating family and interview/physical exam; more than 50% of that time was spent in counseling and/or coordination of care.   Alejandro Lazarus Marker, DO [facility] Available via Epic secure chat 7am-7pm After these hours, please refer to coverage provider listed on [email or web address] 03/14/2024, 4:27 PM   "

## 2024-03-14 NOTE — Progress Notes (Signed)
 Nutrition Follow-up  DOCUMENTATION CODES:  Non-severe (moderate) malnutrition in context of chronic illness  INTERVENTION:  Change to nocturnal TF via Cortrak: Osmolite 1.5 at 50 ml/hr x 12 hours from 1800 to 0600 (600 mL per day) Free water  flushes 200 ml q4h Provides 900 kcal (meet 55% of caloric needs), 38 gm protein (meets 54% of protein needs), 1657 ml free water  daily. Juven BID to support wound healing Calorie Count to assess need for PEG tube placement if within goals of care  Feeding assist with all meals  NUTRITION DIAGNOSIS:  Moderate Malnutrition related to chronic illness as evidenced by mild fat depletion, mild muscle depletion.- remains applicable  GOAL:  Patient will meet greater than or equal to 90% of their needs- goal met via TF  MONITOR:  Diet advancement, TF tolerance, Labs, Weight trends, I & O's  REASON FOR ASSESSMENT:  Consult Enteral/tube feeding initiation and management  ASSESSMENT:  74 yo female admitted with subacute R MCA CVA with dysphagia, dysarthria and L sided deficits, +enlarging thoracoabdominal aortic aneurysm, AKI on CKD. PMH includes HTN, severe aortic stenosis, nonischemic CM, schizophrenia, dementia, CKD 3, DM  12/27 - Admitted. Large MCA infarct with L sided deficits, dysphagia, dysarthria. 12/29 - Cortrak placed, TF initiated. 12/30 - s/p SLP evaluation, recommended to continue NPO status with allowance of floor snacks of puree with strict aspiration precautions and full RN supervision.  01/05 - MBS- pt refused all trials  01/08 - MBS, diet advanced to Dysphagia 1/Honey Thick Liquids  Team with ongoing discussion of need for PEG tube, plan to start calorie count after diet was advanced to assess pt PO intake. Will transition to nocturnal feeds as well to help increase appetite during the day for meal intakes.   Patient laying in bet at time of RD visit. RD observed breakfast tray with few bites took. Calorie count envelope hung on door.    Admit weight: 96 kg Current weight: 95.8 kg  Nutrition Related Medications: Colace, NovoLog  0-15 units q4h, Miralax  Labs: Sodium 136, Potassium 4.7, BUN 36, Creatinine 1.06, Phosphorus 3.5, Magnesium  2.2, Folate 8.7, Vitamin B12 1503  CBG: 83-149 mg/dL x 24 hrs   UOP: 349 mL + 2 occurrences x 24 hrs   Diet Order:   Diet Order             Diet NPO time specified  Diet effective now           DIET - DYS 1 Room service appropriate? No; Fluid consistency: Honey Thick  Diet effective now                   EDUCATION NEEDS:  Not appropriate for education at this time  Skin:  Skin Assessment: Skin Integrity Issues: Skin Integrity Issues:: Stage II Stage II: left buttock  Last BM:  1/4 type 6 large  Height:  Ht Readings from Last 1 Encounters:  03/01/24 5' 7 (1.702 m)   Weight:  Wt Readings from Last 1 Encounters:  03/14/24 95.8 kg   BMI:  Body mass index is 33.08 kg/m.  Estimated Nutritional Needs:  Kcal:  1650-1800 Protein:  70-80 g Fluid:  >/= 1.7L   Nestora Glatter RD, LDN Registered Dietitian I Please see AMION for contact information
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
## 2024-03-14 NOTE — Plan of Care (Signed)
" °  Problem: Metabolic: Goal: Ability to maintain appropriate glucose levels will improve Outcome: Not Progressing   Problem: Nutritional: Goal: Maintenance of adequate nutrition will improve Outcome: Not Progressing Goal: Progress toward achieving an optimal weight will improve Outcome: Not Progressing   Problem: Skin Integrity: Goal: Risk for impaired skin integrity will decrease Outcome: Not Progressing   Problem: Ischemic Stroke/TIA Tissue Perfusion: Goal: Complications of ischemic stroke/TIA will be minimized Outcome: Not Progressing   Problem: Coping: Goal: Will verbalize positive feelings about self Outcome: Not Progressing Goal: Will identify appropriate support needs Outcome: Not Progressing   Problem: Activity: Goal: Risk for activity intolerance will decrease Outcome: Not Progressing   "

## 2024-03-14 NOTE — Progress Notes (Signed)
" ° °  Inpatient Rehabilitation Admissions Coordinator   Patient not at a level to pursue CIR. Recommend SNF.  Heron Leavell, RN, MSN Rehab Admissions Coordinator [phone] 03/14/2024 6:11 PM  "

## 2024-03-14 NOTE — Progress Notes (Signed)
 Pt will be needing SNF once PEG tube placed on Monday.  FL2 done and needs to be faxed out once PEG tube placed. TOC to continue to follow.  PASRR pending

## 2024-03-14 NOTE — Progress Notes (Signed)
 " PROGRESS NOTE    Erin Riley  FMW:[id number] DOB: 1950/03/22 DOA: 03/01/2024 PCP: [name], MD   Brief Narrative:  Ms. Figge is a 74 yo female with PMH CAD, CKD, HLD, HTN, PAD, schizophrenia, severe AS who was found on the floor at home.  She had dense left-sided weakness, slurred speech, facial droop.  She required intubation upon arrival to the ER.  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  MRI brain showed acute right MCA territory infarct with 2 mm leftward midline shift.  She was able to be weaned off pressors and extubated.  Also evaluated by vascular surgery.  Subsequently she is not able to take any oral intake and has a core track.  Given her inability to advance her diet initially PEG tube was recommended with tentative plan of placement on 03/17/2024 per the IR team. MBS done and now on D1 Honey Thick Liquid Diet and will see if she tolerates and calorie count initiated. TF being adjusted by Dietary team.    PT/OT recommending CIR but may not be a candidate and may need to pursue SNF and other venues.  Assessment and Plan:   Subacute R MCA CVA  Dysphagia as a result of CVA -Presented outside of window for intervention. Etiology felt most likely cardioembolic  - Appreciate stroke team's management.  Agree that stroke was likely cardioembolic, although no LV thrombus seen on echo -SBP goal is complex:  enlarging aortic aneurysms, hx AS -- need to avoid significant HTN with rupture risk. Goal SBP 100-140  -Cortrak; continue TF for now; SLP will continue following -Unable to perform MBS on 1/5 due to her agitation and unable to follow prompts; will discuss with family about placing PEG tube at this point as she is likely to need and if does  happen to progress in the future, can be removed; do not think family wants to be anything other than aggressive at this point -potential CIR candidate; has significant dense left hemiplegia with neglect; if declined, will need SNF pursuit after PEG placement; per to the inpatient rehabilitation coordinator she has low tolerance for and slow progression with therapy and they are likely cannot recommend other venues but we will continue to check on her in house -C/w Atorvastatin  80 mg per Tube Daily for now but may need to hold given Abnormal LFTs - Asa on hold 1/6 - 1/12 in anticipation of PEG on 1/12, then resume ASA 1/13 -Continue Eliquis  thru 1/9; hold 48 hrs for PEG on 1/12 and resume on 1/13 - IR requesting repeat SLP eval prior to PEG.  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  -not a candidate for open or endovascular repair at this time. See VVS note 12/28 for details   Hx NICM  HFrEF -- LVEF 25-30%  Severe AS -Not a TAVR candidate  -GDMT limited at this time w renal dysfxn; Holding w/ ASA 81 mg per NG Tube Daily and resuming 1/13; C/w Atrovastatin 80 mg per Tube Daily, and Carvedilol  12.5 mg per Tube BID   AKI on CKD3b, improving  Elevated CK, improving  -D/C'd Foley;  Renal function  improving and back to baseline. BUN/Cr Trend: Recent Labs  Lab 03/03/24 0448 03/04/24 0458 03/05/24 0407 03/06/24 0450 03/12/24 0502 03/13/24 0617 03/14/24 0409  BUN 46* 39* 36* 33* 35* 30* 36*  CREATININE 2.14* 1.72* 1.63* 1.41* 1.00 1.02* 1.06*  -IVF now stopped and getting TF as below -Avoid Nephrotoxic Medications, Contrast Dyes, Hypotension and Dehydration to Ensure Adequate Renal Perfusion and will need to Renally Adjust Meds -Continue to Monitor and Trend Renal Function carefully and repeat CMP in the AM   Abnormal LFTs: AST and ALT Trend mildly elevated and slightly improved:  Recent Labs  Lab 03/01/24 1517 03/13/24 0617 03/14/24 0409  AST 34 71* 60*  ALT 16 81* 69*  -Likely reactive. CTM and Trend and may need to hold Atorvastatin  per Tube given Elevation but will continue for now.   Hypernatremia -> Hyponatremia: Improved. Na+ Trend:  Recent Labs  Lab 03/03/24 0448 03/04/24 0458 03/05/24 0407 03/06/24 0450 03/12/24 0502 03/13/24 0617 03/14/24 0409  NA 145 148* 150* 150* 134* 135 136  -CTM and Trend and repeat CMP in the AM    DM2 w Hyperglycemia: C/w Moderate Novolog  SSI q4h. CTM CBGs per Protocol. CBG Trend:  Recent Labs  Lab 03/13/24 1644 03/13/24 1932 03/14/24 0015 03/14/24 0322 03/14/24 0742 03/14/24 1128 03/14/24 1631  GLUCAP 89 149* 128* 107* 136* 83 112*   Leukocytosis + low grade temp: Resolved on last check is WBC is 9.4. Some upper lobe rhonchi, plausible aspirations  -MRSA PCR neg. Had a foley but removed. Augmentin  course completed. WBC Trend:  Recent Labs  Lab 03/03/24 0448 03/04/24 0458 03/05/24 0407 03/06/24 0450 03/11/24 0322 03/13/24 0617 03/14/24 0409  WBC 17.2* 14.1* 14.2* 11.7* 10.2 9.2 9.4  -Continue to monitor for signs and symptoms of infection  Normocytic Anemia: Hgb/Hct Trend fairly stable:  Recent Labs  Lab 03/03/24 0448 03/04/24 0458 03/05/24 0407 03/06/24 0450 03/11/24 0322 03/13/24 0617 03/14/24 0409   HGB 10.3* 10.3* 10.5* 10.5* 10.7* 10.1* 10.2*  HCT 33.0* 33.4* 33.5* 34.8* 34.8* 32.1* 32.6*  MCV 91.2 92.3 93.6 94.1 91.1 89.4 90.6  -Checked Anemia Panel and showed an iron  level of 27, UIBC of 230, TIBC 58, saturation of 10%, ferritin of 344, folate level 8.7 and vitamin B12 1503.  CTM for S/Sx of Bleeding; No overt bleeding noted. Repeat CBC in the AM   Thrombocytosis: Mild and in the setting of above. Plt Count Trend:  Recent Labs  Lab 03/03/24 0448 03/04/24 0458 03/05/24 0407 03/06/24 0450 03/11/24 0322 03/13/24 0617 03/14/24 0409  PLT 183 189 231 280 484* 531* 559*  -CTM and Trend and repeat CBC In the AM  Non-Severe (Moderate) Malnutrition in the context of chronic illness: Nutrition Status: Nutrition Problem: Moderate Malnutrition Etiology: chronic illness Signs/Symptoms: mild fat depletion, mild muscle depletion Interventions: Refer to RD note for recommendations - Currently getting tube feedings via core track with Osmolite 1.5 at 50 mL/h for 12 hours from 1800 -> 0600 and continuing free  water  flushes 200 mL every 4 scheduled.  She is also getting Juven twice daily to support wound healing and calorie count has been placed to assess the need for PEG tube placement and currently has not really eaten anything today  Hypoalbuminemia: Patient's Albumin  Level went from 4.0 -> 3.1 on the last check. CTM & Trend & repeat CMP in the AM  Class I Obesity: Complicates overall prognosis and care. Estimated body mass index is 33.08 kg/m as calculated from the following:   Height as of this encounter: 5' 7 (1.702 m).   Weight as of this encounter: 95.8 kg. Weight Loss and Dietary Counseling given   DVT prophylaxis: SCDs Start: 03/01/24 1745 apixaban  (ELIQUIS ) tablet 5 mg  apixaban  (ELIQUIS ) tablet 5 mg    Code Status: Full Code Family Communication: No family present @ bedside   Disposition Plan:  Level of care: Med-Surg Status is: Inpatient Remains inpatient appropriate  because: Needs to have safe discharge disposition and will need to evaluate her Nutrition   Consultants:  Neurology Interventional Radiology Vascular Surgery  Procedures:  As delineated as above   Antimicrobials:  Anti-infectives (From admission, onward)    Start     Dose/Rate Route Frequency Ordered Stop   03/17/24 0800  ceFAZolin  (ANCEF ) IVPB 2g/100 mL premix        2 g 200 mL/hr over 30 Minutes Intravenous To Radiology 03/11/24 1611 03/18/24 0800   03/05/24 1130  amoxicillin -clavulanate (AUGMENTIN ) 875-125 MG per tablet 1 tablet        1 tablet Per Tube Every 12 hours 03/05/24 1036 03/07/24 2101   03/03/24 0930  cefTRIAXone  (ROCEPHIN ) 2 g in sodium chloride  0.9 % 100 mL IVPB  Status:  Discontinued        2 g 200 mL/hr over 30 Minutes Intravenous Every 24 hours 03/03/24 0838 03/05/24 1036       Subjective: Seen and examined at bedside and was somnolent and drowsy and nursing states that she did not eat any breakfast this morning.  Wanting to sleep and rest.  Subsequently she did eat about 50% of her lunch.  Denied current pain.  No other concerns or complaints at this time and calorie count is underway.  Objective: Vitals:   03/14/24 0546 03/14/24 0741 03/14/24 1231 03/14/24 1631  BP:  128/84 114/69 114/64  Pulse:  72 71 73  Resp:  20 18 16   Temp:  98.4 F (36.9 C)    TempSrc:      SpO2:  95% 97% 100%  Weight: 95.8 kg     Height:        Intake/Output Summary (Last 24 hours) at 03/14/2024 1727 Last data filed at 03/14/2024 1400 Gross per 24 hour  Intake 3158 ml  Output 250 ml  Net 2908 ml   Filed Weights   03/12/24 0500 03/13/24 0718 03/14/24 0546  Weight: 96.8 kg 96 kg 95.8 kg   Examination: Physical Exam:  Constitutional: WN/WD obese African-American female who is resting in no acute distress Respiratory: Diminished to auscultation bilaterally, no wheezing, rales, rhonchi or crackles. Normal respiratory effort and patient is not tachypenic. No accessory muscle  use.  Unlabored breathing Cardiovascular: RRR, no murmurs / rubs / gallops. S1 and S2 auscultated. No extremity edema.  Abdomen: Soft, non-tender, distended secondary but habitus. Bowel sounds positive.  GU: Deferred. Musculoskeletal: No clubbing / cyanosis of digits/nails. No joint deformity upper and lower extremities. Skin: No rashes, lesions, ulcers. No induration; Warm and dry.  Neurologic: Patient was  somnolent drowsy and severely dysarthric with hemiplegia on the left Psychiatric: A little somnolent and drowsy and appears calm  Data Reviewed: I have personally reviewed following labs and imaging studies  CBC: Recent Labs  Lab 03/11/24 0322 03/13/24 0617 03/14/24 0409  WBC 10.2 9.2 9.4  NEUTROABS  --  6.7 7.0  HGB 10.7* 10.1* 10.2*  HCT 34.8* 32.1* 32.6*  MCV 91.1 89.4 90.6  PLT 484* 531* 559*   Basic Metabolic Panel: Recent Labs  Lab 03/12/24 0502 03/13/24 0617 03/14/24 0409  NA 134* 135 136  K 4.7 4.7 4.7  CL 101 102 102  CO2 25 26 24   GLUCOSE 129* 129* 102*  BUN 35* 30* 36*  CREATININE 1.00 1.02* 1.06*  CALCIUM  9.6 9.9 9.7  MG  --  2.3 2.2  PHOS  --  3.4 3.5   GFR: Estimated Creatinine Clearance: 57 mL/min (A) (by C-G formula based on SCr of 1.06 mg/dL (H)). Liver Function Tests: Recent Labs  Lab 03/13/24 0617 03/14/24 0409  AST 71* 60*  ALT 81* 69*  ALKPHOS 135* 140*  BILITOT 0.5 0.5  PROT 7.4 7.6  ALBUMIN  3.1* 3.1*   No results for input(s): LIPASE, AMYLASE in the last 168 hours. No results for input(s): AMMONIA in the last 168 hours. Coagulation Profile: No results for input(s): INR, PROTIME in the last 168 hours. Cardiac Enzymes: No results for input(s): CKTOTAL, CKMB, CKMBINDEX, TROPONINI in the last 168 hours. BNP (last 3 results) No results for input(s): PROBNP in the last 8760 hours. HbA1C: No results for input(s): HGBA1C in the last 72 hours. CBG: Recent Labs  Lab 03/14/24 0015 03/14/24 0322 03/14/24 0742  03/14/24 1128 03/14/24 1631  GLUCAP 128* 107* 136* 83 112*   Lipid Profile: No results for input(s): CHOL, HDL, LDLCALC, TRIG, CHOLHDL, LDLDIRECT in the last 72 hours. Thyroid  Function Tests: No results for input(s): TSH, T4TOTAL, FREET4, T3FREE, THYROIDAB in the last 72 hours. Anemia Panel: Recent Labs    03/14/24 0409  VITAMINB12 1,503*  FOLATE 8.7  FERRITIN 344*  TIBC 258  IRON  27*  RETICCTPCT 2.2   Sepsis Labs: No results for input(s): PROCALCITON, LATICACIDVEN in the last 168 hours.  No results found for this or any previous visit (from the past 240 hours).   Radiology Studies: DG Swallowing Func-Speech Pathology Result Date: 03/13/2024 Table formatting from the original result was not included. Modified Barium Swallow Study Patient Details Name: Erin Riley MRN: [id number] Date of Birth: 10/22/1950 Today's Date: 03/13/2024 HPI/PMH: HPI: Auna Mikkelsen is a 74 y.o. female who presented to the hospital from home on 03/01/24 after being found by her son with left sided weakness, slurred speech and facial droop. She was intubated upon arrival to ER. CXR did not show acute abnormality, CT chest did not show any focal consolidation or PE. 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 She was extubated on 03/02/24 at 1305 to oxygen via [place]. PMH: HTN, severe aortic stenosis, nonischemic cardiomyopathy, CKD stage IIIb, schizophrenia. MBSS completed on 12/29 with recommendation to continue NPO with the exception of puree snacks. Attempted MBS repeat MBS 1/5 however she refused. Pt accepting po at bedside and repeat MBS today for possible initiation of po. Clinical Impression: Clinical Impression: Pt's oropharyngeal swallow function has improved from prior study with significantly decreased aspiration episodes with deficits in swallow safety versus efficiency. She continues with reduced labial seal, lingual control of  thin spilling to vocal cords with  trace aspiration during second sip with reflexive cough unable to determine if clearance achieved. Following cough there was retrograde movement into pyriforms from PES. She has disorganized lingual propulsion and lingual movements. Pharyngeally although there is reduced anterior hyoid excursion and intermittent partial epiglottic excursion her laryngeal elevation and glottic closure appear adequate. Her primary impairment is poor timing and onset of swallow with frequent entrance of barium into vestibule prior to swallow initiation with thin and nectar. No sensation of nectar to vocal cords although not visible on following frame. Flash penetration with honey and puree with good efficiency of swallow and no significant pharyngeal residue. Esophageal scan revealed barium in distal esophagus with mild retrograde movement. Recommend initiate puree, honey thick liquids, pills crushed, check for pocketing, full supervision and assist due to cognitive deficits and stay upright after meals. ST to follow. Factors that may increase risk of adverse event in presence of aspiration Noe & Lianne 2021): Factors that may increase risk of adverse event in presence of aspiration Noe & Lianne 2021): Reduced cognitive function; Limited mobility Recommendations/Plan: Swallowing Evaluation Recommendations Swallowing Evaluation Recommendations Recommendations: PO diet PO Diet Recommendation: Dysphagia 1 (Pureed); Moderately thick liquids (Level 3, honey thick) Liquid Administration via: Cup Medication Administration: Crushed with puree Supervision: Staff to assist with self-feeding; Full supervision/cueing for swallowing strategies Swallowing strategies  : Slow rate; Small bites/sips; Check for pocketing or oral holding Postural changes: Position pt fully upright for meals; Stay upright 30-60 min after meals Oral care recommendations: Oral care BID (2x/day) Treatment Plan Treatment Plan  Treatment recommendations: Therapy as outlined in treatment plan below Follow-up recommendations: Skilled nursing-short term rehab (<3 hours/day) Functional status assessment: Patient has had a recent decline in their functional status and demonstrates the ability to make significant improvements in function in a reasonable and predictable amount of time. Treatment frequency: Min 2x/week Treatment duration: 2 weeks Interventions: Compensatory techniques; Patient/family education; Trials of upgraded texture/liquids; Diet toleration management by SLP Recommendations Recommendations for follow up therapy are one component of a multi-disciplinary discharge planning process, led by the attending physician.  Recommendations may be updated based on patient status, additional functional criteria and insurance authorization. Assessment: Orofacial Exam: Orofacial Exam Oral Cavity: Oral Hygiene: WFL Oral Cavity - Dentition: Edentulous; Dentures, not available Orofacial Anatomy: Other (comment) Oral Motor/Sensory Function: Suspected cranial nerve impairment CN V - Trigeminal: Left motor impairment CN VII - Facial: Left motor impairment Anatomy: Anatomy: WFL Boluses Administered: Boluses Administered Boluses Administered: Thin liquids (Level 0); Mildly thick liquids (Level 2, nectar thick); Moderately thick liquids (Level 3, honey thick); Puree  Oral Impairment Domain: Oral Impairment Domain Lip Closure: Escape progressing to mid-chin Tongue control during bolus hold: Posterior escape of less than half of bolus Bolus preparation/mastication: -- (NT) Bolus transport/lingual motion: Repetitive/disorganized tongue motion Oral residue: Residue collection on oral structures Location of oral residue : Floor of mouth; Tongue Initiation of pharyngeal swallow : Posterior laryngeal surface of the epiglottis  Pharyngeal Impairment Domain: Pharyngeal Impairment Domain Soft palate elevation: No bolus between soft palate (SP)/pharyngeal wall  (PW) Laryngeal elevation: Complete superior movement of thyroid  cartilage with complete approximation of arytenoids to epiglottic petiole (material only present due to poor timing of elevation) Anterior hyoid excursion: Partial anterior movement Epiglottic movement: Partial inversion Laryngeal vestibule closure: Complete, no air/contrast in laryngeal vestibule (material present due to poor timing and onset) Pharyngeal stripping wave : Present - complete Pharyngeal contraction (A/P view only): N/A Pharyngoesophageal segment opening: Partial distention/partial duration, partial obstruction of flow (prominent cricopharyngeus  with honey, retrograde movement wtih thin into pyriforms x 1) Tongue base retraction: Trace column of contrast or air between tongue base and PPW Pharyngeal residue: Trace residue within or on pharyngeal structures Location of pharyngeal residue: Valleculae (several instances, mostly clears pharynx)  Esophageal Impairment Domain: Esophageal Impairment Domain Esophageal clearance upright position: Esophageal retention Pill: No data recorded Penetration/Aspiration Scale Score: Penetration/Aspiration Scale Score 2.  Material enters airway, remains ABOVE vocal cords then ejected out: Puree; Moderately thick liquids (Level 3, honey thick); Mildly thick liquids (Level 2, nectar thick) 5.  Material enters airway, CONTACTS cords and not ejected out: Mildly thick liquids (Level 2, nectar thick) 7.  Material enters airway, passes BELOW cords and not ejected out despite cough attempt by patient: Thin liquids (Level 0); Mildly thick liquids (Level 2, nectar thick); Moderately thick liquids (Level 3, honey thick) Compensatory Strategies: Compensatory Strategies Compensatory strategies: No   General Information: Caregiver present: No  Diet Prior to this Study: NPO   Temperature : Normal   Respiratory Status: WFL   Supplemental O2: None (Room air)   History of Recent Intubation: Yes  Behavior/Cognition: Alert;  Cooperative; Confused; Distractible; Requires cueing Self-Feeding Abilities: Needs assist with self-feeding Baseline vocal quality/speech: Normal Volitional Cough: Unable to elicit Volitional Swallow: Unable to elicit Exam Limitations: No limitations Goal Planning: Prognosis for improved oropharyngeal function: -- (fair-good) Barriers to Reach Goals: Cognitive deficits No data recorded No data recorded Consulted and agree with results and recommendations: Patient; Nurse Pain: Pain Assessment Pain Assessment: No/denies pain Faces Pain Scale: 0 Breathing: 0 Negative Vocalization: 0 Facial Expression: 0 Body Language: 0 Consolability: 0 PAINAD Score: 0 End of Session: Start Time:SLP Start Time (ACUTE ONLY): 1405 Stop Time: SLP Stop Time (ACUTE ONLY): 1417 Time Calculation:SLP Time Calculation (min) (ACUTE ONLY): 12 min Charges: SLP Evaluations $ SLP Speech Visit: 1 Visit SLP Evaluations $MBS Swallow: 1 Procedure $Swallowing Treatment: 1 Procedure SLP visit diagnosis: SLP Visit Diagnosis: Dysphagia, oropharyngeal phase (R13.12) Past Medical History: Past Medical History: Diagnosis Date  Chronic back pain   Chronic kidney disease (CKD), stage II (mild)   Class I-II  Coronary artery disease 04/2009  50% stenosis in the perforator of LAD; catheterization was for an abnormal Myoview in January 2000 showing anterior and inferolateral ischemia.  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 > 3 subsequent Echos show improved/normal EF with moderate to severe concentric LVH and diastolic dysfunction with LVOT/intracavitary gradient --> 06/2016: Severe LVH.  Vigorous EF, 65-70%.?? Gr 1 DD. Mild AS.  Hyperlipidemia   Hypertension   Hypertensive hypertrophic cardiomyopathy: NYHA class II:  Echo: Severe concentric LVH with LV OT gradient; essentially preserved EF with diastolic dysfunction 02/15/2013  Echo 06/2016: Severe  Concentric LVH. Vigorous EF 65-70%. ~ Gr I DD.   Mild aortic stenosis by prior echocardiography   Echo 06/2016: Mild AS (Mean Gradient 15 mmHg); has had prior Mod-Severe MR (not seen on current echo)  PAD (peripheral artery disease) March 2013  Lower extremity Dopplers: R. SFA 50-60%, R. PTA proximally occluded with distal reconstitution;; L. common iliac ~50%, L. SFA 50-70% stenosis, L. PTA < 50%  Schizophrenia (HCC)  Past Surgical History: Past Surgical History: Procedure Laterality Date  BUNIONECTOMY    carotid doppler  05/29/2011  left bulb/prox ICA moderate amtfibrous plaque with no evidence significant reduction.,right bulb /proximal ICA normal patency  lower extremity doppler  05/29/2011  right SFA 50% to  59% diameter reduction,right posterior tibal atreery occlusive disease,reconstituting distally, left common illiac<50%,left SFA 50 to70%,left post. tibial <50%  NM MYOCAR PERF WALL MOTION  03/2009  Persantine; EF 51%-both anterior and inferolateral ischemia  TRANSTHORACIC ECHOCARDIOGRAM  06/2016  Severe LVH.  Vigorous EF of 65-70%.  No RWMA. ~Only grade 1 diastolic dysfunction.  Mild aortic stenosis (mean gradient 15 mmHg)  TRANSTHORACIC ECHOCARDIOGRAM  07/2012  EF 50-55%; severe concentric LVH; only grade 1 diastolic dysfunction. Mild aortic sclerosis - with LVOT /intracavitary gradient of roughly 20 mmHg mean. Mild to moderately dilated LA;; previously reported MR not seen Dustin Olam Bull 03/13/2024, 6:42 PM  Scheduled Meds:  amLODipine   10 mg Per Tube Daily   apixaban   5 mg Per Tube BID   [START ON 03/18/2024] apixaban   5 mg Per Tube BID   [START ON 03/18/2024] aspirin   81 mg Per NG tube Daily   atorvastatin   80 mg Per Tube Daily   carvedilol   12.5 mg Per Tube BID   docusate  100 mg Per Tube BID   feeding supplement (OSMOLITE 1.5 CAL)  1,000 mL Per Tube Q24H   free water   200 mL Per Tube Q4H   insulin  aspart  0-15 Units Subcutaneous Q4H   nutrition supplement (JUVEN)  1 packet Per Tube BID BM    OLANZapine   7.5 mg Per Tube QHS   mouth rinse  15 mL Mouth Rinse 4 times per day   polyethylene glycol  17 g Per Tube Daily   Continuous Infusions:  [START ON 03/17/2024]  ceFAZolin  (ANCEF ) IV      LOS: 13 days   Alejandro Marker, DO [facility] Available via Epic secure chat 7am-7pm After these hours, please refer to coverage provider listed on [email or web address] 03/14/2024, 5:27 PM  "

## 2024-03-14 NOTE — Progress Notes (Signed)
 Physical Therapy Treatment Patient Details Name: Erin Riley MRN: [id number] DOB: Dec 14, 1950 Today's Date: 03/14/2024   History of Present Illness 74 y.o. female admitted 03/01/24 after being found down by son with left sided weakness, slurred speech, facial droop, apnea. MRI showed acute R MCA territory infarct with 2mm leftward midline shift. Chest CTA showed interval enlargement of descending thoracic aortic aneurysm. ETT 12/27-12/28. Cotrak placed due to dysphagia; plan for peg tube placement 1/12. PMH includes HTN, hypertrophic cardiomyopathy, PAD, CKD III, HLD, schizophrenia, AAA.   PT Comments  Pt with improved alertness this afternoon, stating name and some words fairly clearly, but decreased participation with attempts to progress to seated mobility, requiring min-modA for bed mobility and briefly sitting EOB before laying self down. Pt remains limited by LUE hemiplegia, L inattention, decreased activity tolerance, poor balance strategies/postural reactions and impaired cognition. Recommend post-acute rehab (< 3 hrs/day) to maximize functional mobility and independence.     If plan is discharge home, recommend the following: Two people to help with walking and/or transfers;Two people to help with bathing/dressing/bathroom;Assistance with cooking/housework;Assistance with feeding;Direct supervision/assist for medications management;Assist for transportation;Direct supervision/assist for financial management;Supervision due to cognitive status   Can travel by private vehicle     No  Equipment Recommendations  Wheelchair (measurements PT);Wheelchair cushion (measurements PT);Hospital bed;Hoyer lift    Recommendations for Other Services       Precautions / Restrictions Precautions Precautions: Fall;Other (comment) Recall of Precautions/Restrictions: Impaired Precaution/Restrictions Comments: L inattention, LUE hemiplegia, cortrak Restrictions Weight Bearing Restrictions Per Provider  Order: No     Mobility  Bed Mobility Overal bed mobility: Needs Assistance Bed Mobility: Sidelying to Sit, Sit to Sidelying, Rolling   Sidelying to sit: Mod assist, HOB elevated     Sit to sidelying: Min assist General bed mobility comments: modA to initiate BLE movement and trunk elevation, pt rolling onto LUE without awareness requiring max cues, pt then assisting LUE with RUE when cued; pt returning to sidelying despite multiple attempts to stay sitting, minA for LLE management. pt rolling self to supine when asked in order to allow for LUE elevation and repositioning to prevent pt from laying on it    Transfers                   General transfer comment:  (unable to progress this session)    Ambulation/Gait                   Stairs             Wheelchair Mobility     Tilt Bed    Modified Rankin (Stroke Patients Only)       Balance Overall balance assessment: Needs assistance Sitting-balance support: Single extremity supported, Feet supported Sitting balance-Leahy Scale: Fair                                      Hotel Manager: Impaired Factors Affecting Communication: Difficulty expressing self;Reduced clarity of speech  Cognition Arousal: Alert Behavior During Therapy: Restless   PT - Cognitive impairments: Awareness, Memory, Attention, Initiation, Sequencing, Problem solving, Safety/Judgement                       PT - Cognition Comments: pt initially alert, conversant, able to state name; once time to sit up, pt keeping eyes closed with decreased interaction, laying self back down and  seemingly not wanting to interact despite multiple attempts Following commands: Impaired Following commands impaired: Follows one step commands inconsistently    Cueing Cueing Techniques: Verbal cues, Gestural cues, Tactile cues  Exercises      General Comments General comments (skin integrity,  edema, etc.): notable L wrist swelling, painful to palpation of wrist and elbow, no grimacing with shoulder AROM; LUE repositioned and elevated in hopes to keep pt from laying on it (pt preference for L sidelying)      Pertinent Vitals/Pain Pain Assessment Pain Assessment: Faces Pain Location: LUE Pain Descriptors / Indicators: Guarding, Grimacing, Moaning Pain Intervention(s): Monitored during session, Repositioned    Home Living                          Prior Function            PT Goals (current goals can now be found in the care plan section) Acute Rehab PT Goals Time For Goal Achievement: 03/28/24 Potential to Achieve Goals: Fair Progress towards PT goals: Goals downgraded-see care plan    Frequency    Min 2X/week      PT Plan      Co-evaluation              AM-PAC PT 6 Clicks Mobility   Outcome Measure  Help needed turning from your back to your side while in a flat bed without using bedrails?: A Lot Help needed moving from lying on your back to sitting on the side of a flat bed without using bedrails?: A Lot Help needed moving to and from a bed to a chair (including a wheelchair)?: Total Help needed standing up from a chair using your arms (e.g., wheelchair or bedside chair)?: Total Help needed to walk in hospital room?: Total Help needed climbing 3-5 steps with a railing? : Total 6 Click Score: 8    End of Session   Activity Tolerance: Other (comment) (treatment limited secondary to impaired cognition) Patient left: in bed;with call bell/phone within reach;with bed alarm set Nurse Communication: Mobility status PT Visit Diagnosis: Other abnormalities of gait and mobility (R26.89);Muscle weakness (generalized) (M62.81);Other symptoms and signs involving the nervous system (R29.898);Hemiplegia and hemiparesis Hemiplegia - Right/Left: Left Hemiplegia - dominant/non-dominant: Non-dominant Hemiplegia - caused by: Cerebral infarction      Time: 1502-1520 PT Time Calculation (min) (ACUTE ONLY): 18 min  Charges:    $Therapeutic Activity: 8-22 mins PT General Charges $$ ACUTE PT VISIT: 1 Visit                      Darice Almas, PT, DPT Acute Rehabilitation Services  Personal: Secure Chat Rehab Office: [phone]  Darice LITTIE Almas 03/14/2024, 4:25 PM

## 2024-03-14 NOTE — Progress Notes (Signed)
 Speech Language Pathology Treatment: Dysphagia  Patient Details Name: Erin Riley MRN: [id number] DOB: 03/27/1950 Today's Date: 03/14/2024 Time: 8883-8875 SLP Time Calculation (min) (ACUTE ONLY): 8 min  Assessment / Plan / Recommendation Clinical Impression  Pt sleepy and RN stated had a restless night and did not eat breakfast. She was able to wake adequately with verbal/tactile cues to attempt honey thick juice and pudding. Left leakage due to CN VII impairment and immediate cough with juice and pudding trial likely invading airway however reflexive cough was strong. RN reported she ate dinner well last night and observations this session likely due to inadequate alertness. No further trials given and RN will hold off on po's until safely alert. Continue puree/honey when alert, check for left oral pocketing, ask pt to clear throat intermittently and full supervision/assist for strategies. ST will follow for dysphagia, dysarthria and cognition. RD initiated calorie count and change to nocturnal feeds (?), possible PEG Mon if decrease intake    HPI HPI: Erin Riley is a 74 y.o. female who presented to the hospital from home on 03/01/24 after being found by her son with left sided weakness, slurred speech and facial droop. She was intubated upon arrival to ER. CXR did not show acute abnormality, CT chest did not show any focal consolidation or PE. 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 She was extubated on 03/02/24 at 1305 to oxygen via [place]. PMH: HTN, severe aortic stenosis, nonischemic cardiomyopathy, CKD stage IIIb, schizophrenia. MBSS completed on 12/29 with recommendation to continue NPO with the exception of puree snacks. Attempted MBS repeat MBS 1/5 however she refused. Pt accepting po at bedside and repeat MBS today for possible initiation of po.      SLP Plan  Continue with current plan of care        Swallow Evaluation  Recommendations   Recommendations: PO diet PO Diet Recommendation: Dysphagia 1 (Pureed);Moderately thick liquids (Level 3, honey thick) Liquid Administration via: Cup Medication Administration: Crushed with puree Supervision: Staff to assist with self-feeding;Full supervision/cueing for swallowing strategies Postural changes: Position pt fully upright for meals;Stay upright 30-60 min after meals Oral care recommendations: Oral care BID (2x/day)     Recommendations                     Oral care BID   Frequent or constant Supervision/Assistance Dysphagia, oropharyngeal phase (R13.12)     Continue with current plan of care     Dustin Olam Bull  03/14/2024, 11:43 AM

## 2024-03-14 NOTE — Progress Notes (Addendum)
 Occupational Therapy Treatment Patient Details Name: Erin Riley MRN: [id number] DOB: January 17, 1951 Today's Date: 03/14/2024   History of present illness Pt is a 74 y/o F admitted 03/01/24 after being found down by her son with left sided weakness, slurred speech and facial droop, also intermittent episodes of apnea. 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 She was intubated 12/27-28/25. PMH includes HTN, hypertrophic cardiomyopathy, PAD, CKD III, HLD, schizophrenia, AAA.   OT comments  Patient initially lethargic, did close eyes throughout, but attended to tasks with cues.  Was able to sit EOB with Mod A, but then CGA for EOB balance.  Worked on turning head left and locating items to the left of her visual field.  Able to locate washcloth to left of tray table and wash her face seated.  Will probably need +2 for OOB stand pivot transfer when LOA improves.  OT to continue efforts in the acute setting, and Patient will benefit from intensive inpatient follow-up therapy, >3 hours/day.      If plan is discharge home, recommend the following:  Two people to help with walking and/or transfers;Assist for transportation;Supervision due to cognitive status;A lot of help with bathing/dressing/bathroom   Equipment Recommendations       Recommendations for Other Services      Precautions / Restrictions Precautions Precautions: Fall Recall of Precautions/Restrictions: Impaired Precaution/Restrictions Comments: L inattention, cortrak Restrictions Weight Bearing Restrictions Per Provider Order: No       Mobility Bed Mobility Overal bed mobility: Needs Assistance Bed Mobility: Sidelying to Sit, Sit to Sidelying   Sidelying to sit: Mod assist, Used rails     Sit to sidelying: Min assist General bed mobility comments: min A to being LEs to and off EOB and to elevate trunk, dense cues to scoot around to  square hips at EOB    Transfers                         Balance Overall balance assessment: Needs assistance Sitting-balance support: Single extremity supported, Feet supported Sitting balance-Leahy Scale: Fair   Postural control: Left lateral lean                                 ADL either performed or assessed with clinical judgement   ADL Overall ADL's : Needs assistance/impaired Eating/Feeding: NPO   Grooming: Wash/dry hands;Wash/dry face;Minimal assistance;Sitting           Upper Body Dressing : Moderate assistance;Sitting;Maximal assistance   Lower Body Dressing: Bed level;Maximal assistance Lower Body Dressing Details (indicate cue type and reason): able to lift legs     Toileting- Clothing Manipulation and Hygiene: Total assistance;Bed level              Extremity/Trunk Assessment Upper Extremity Assessment Upper Extremity Assessment: Right hand dominant;LUE deficits/detail LUE Deficits / Details: edematous, flaccid LUE Sensation: decreased light touch;decreased proprioception LUE Coordination: decreased fine motor;decreased gross motor   Lower Extremity Assessment Lower Extremity Assessment: Defer to PT evaluation   Cervical / Trunk Assessment Cervical / Trunk Assessment: Kyphotic    Vision Ability to See in Adequate Light: 1 Impaired Alignment/Gaze Preference: Gaze right Tracking/Visual Pursuits: Requires cues, head turns, or add eye shifts to track;Impaired - to be further tested in functional context   Perception Perception Perception: Impaired Preception Impairment Details: Inattention/Neglect  Praxis Praxis Praxis: Impaired Praxis Impairment Details: Initiation   Communication Communication Communication: Impaired Factors Affecting Communication: Difficulty expressing self;Reduced clarity of speech   Cognition Arousal: Lethargic Behavior During Therapy: Restless Cognition: Difficult to assess Difficult to  assess due to: Level of arousal, Impaired communication                             Following commands: Intact Following commands impaired: Only follows one step commands consistently      Cueing   Cueing Techniques: Verbal cues, Gestural cues, Tactile cues                     Pertinent Vitals/ Pain       Pain Assessment Pain Assessment: Faces Faces Pain Scale: No hurt Pain Intervention(s): Monitored during session                                                          Frequency  Min 2X/week        Progress Toward Goals  OT Goals(current goals can now be found in the care plan section)  Progress towards OT goals: OT to reassess next treatment  Acute Rehab OT Goals OT Goal Formulation: Patient unable to participate in goal setting Time For Goal Achievement: 03/28/24 Potential to Achieve Goals: Good  Plan      Co-evaluation                 AM-PAC OT 6 Clicks Daily Activity     Outcome Measure   Help from another person eating meals?: Total Help from another person taking care of personal grooming?: A Little Help from another person toileting, which includes using toliet, bedpan, or urinal?: Total Help from another person bathing (including washing, rinsing, drying)?: A Lot Help from another person to put on and taking off regular upper body clothing?: A Lot Help from another person to put on and taking off regular lower body clothing?: A Lot 6 Click Score: 11    End of Session Equipment Utilized During Treatment: Gait belt  OT Visit Diagnosis: Other abnormalities of gait and mobility (R26.89);Muscle weakness (generalized) (M62.81);Low vision, both eyes (H54.2);Other symptoms and signs involving the nervous system (R29.898);Other symptoms and signs involving cognitive function;Hemiplegia and hemiparesis Hemiplegia - Right/Left: Left Hemiplegia - dominant/non-dominant: Non-Dominant Hemiplegia - caused by:  Cerebral infarction   Activity Tolerance Patient tolerated treatment well   Patient Left in bed;with call bell/phone within reach;with bed alarm set   Nurse Communication Other (comment)        Time: 8895-8878 OT Time Calculation (min): 17 min  Charges: OT General Charges $OT Visit: 1 Visit OT Treatments $Therapeutic Activity: 8-22 mins  03/14/2024  RP, OTR/L  Acute Rehabilitation Services  Office:  [phone]   Erin Riley 03/14/2024, 11:33 AM

## 2024-03-14 NOTE — Progress Notes (Signed)
"  ° °   ° °  RE:  Erin Riley  Date of Birth:  1950-11-28  Date:   03/14/2024    To Whom It May Concern:  Please be advised that the above-named patient will require a short-term nursing home stay - anticipated 30 days or less for rehabilitation and strengthening.  The plan is for return home.    "

## 2024-03-15 DIAGNOSIS — I639 Cerebral infarction, unspecified: Secondary | ICD-10-CM | POA: Diagnosis not present

## 2024-03-15 DIAGNOSIS — R471 Dysarthria and anarthria: Secondary | ICD-10-CM | POA: Diagnosis not present

## 2024-03-15 DIAGNOSIS — R1310 Dysphagia, unspecified: Secondary | ICD-10-CM | POA: Diagnosis not present

## 2024-03-15 DIAGNOSIS — I10 Essential (primary) hypertension: Secondary | ICD-10-CM | POA: Diagnosis not present

## 2024-03-15 LAB — MAGNESIUM: Magnesium: 2.2 mg/dL (ref 1.7–2.4)

## 2024-03-15 LAB — CBC WITH DIFFERENTIAL/PLATELET
Abs Immature Granulocytes: 0.04 K/uL (ref 0.00–0.07)
Basophils Absolute: 0 K/uL (ref 0.0–0.1)
Basophils Relative: 0 %
Eosinophils Absolute: 0.3 K/uL (ref 0.0–0.5)
Eosinophils Relative: 3 %
HCT: 31 % — ABNORMAL LOW (ref 36.0–46.0)
Hemoglobin: 9.7 g/dL — ABNORMAL LOW (ref 12.0–15.0)
Immature Granulocytes: 0 %
Lymphocytes Relative: 16 %
Lymphs Abs: 1.5 K/uL (ref 0.7–4.0)
MCH: 28.3 pg (ref 26.0–34.0)
MCHC: 31.3 g/dL (ref 30.0–36.0)
MCV: 90.4 fL (ref 80.0–100.0)
Monocytes Absolute: 0.7 K/uL (ref 0.1–1.0)
Monocytes Relative: 8 %
Neutro Abs: 6.9 K/uL (ref 1.7–7.7)
Neutrophils Relative %: 73 %
Platelets: 547 K/uL — ABNORMAL HIGH (ref 150–400)
RBC: 3.43 MIL/uL — ABNORMAL LOW (ref 3.87–5.11)
RDW: 14.8 % (ref 11.5–15.5)
WBC: 9.4 K/uL (ref 4.0–10.5)
nRBC: 0 % (ref 0.0–0.2)

## 2024-03-15 LAB — GLUCOSE, CAPILLARY
Glucose-Capillary: 101 mg/dL — ABNORMAL HIGH (ref 70–99)
Glucose-Capillary: 110 mg/dL — ABNORMAL HIGH (ref 70–99)
Glucose-Capillary: 120 mg/dL — ABNORMAL HIGH (ref 70–99)
Glucose-Capillary: 122 mg/dL — ABNORMAL HIGH (ref 70–99)
Glucose-Capillary: 127 mg/dL — ABNORMAL HIGH (ref 70–99)
Glucose-Capillary: 128 mg/dL — ABNORMAL HIGH (ref 70–99)

## 2024-03-15 LAB — COMPREHENSIVE METABOLIC PANEL WITH GFR
ALT: 56 U/L — ABNORMAL HIGH (ref 0–44)
AST: 53 U/L — ABNORMAL HIGH (ref 15–41)
Albumin: 2.9 g/dL — ABNORMAL LOW (ref 3.5–5.0)
Alkaline Phosphatase: 126 U/L (ref 38–126)
Anion gap: 9 (ref 5–15)
BUN: 35 mg/dL — ABNORMAL HIGH (ref 8–23)
CO2: 24 mmol/L (ref 22–32)
Calcium: 9.6 mg/dL (ref 8.9–10.3)
Chloride: 103 mmol/L (ref 98–111)
Creatinine, Ser: 1.05 mg/dL — ABNORMAL HIGH (ref 0.44–1.00)
GFR, Estimated: 56 mL/min — ABNORMAL LOW
Glucose, Bld: 119 mg/dL — ABNORMAL HIGH (ref 70–99)
Potassium: 4.7 mmol/L (ref 3.5–5.1)
Sodium: 136 mmol/L (ref 135–145)
Total Bilirubin: 0.4 mg/dL (ref 0.0–1.2)
Total Protein: 7.2 g/dL (ref 6.5–8.1)

## 2024-03-15 LAB — PHOSPHORUS: Phosphorus: 3.2 mg/dL (ref 2.5–4.6)

## 2024-03-15 NOTE — Progress Notes (Signed)
 " PROGRESS NOTE    Erin Riley  FMW:[id number] DOB: 06-28-1950 DOA: 03/01/2024 PCP: [name], MD   Brief Narrative:  [name] is a 74 yo female with PMH CAD, CKD, HLD, HTN, PAD, schizophrenia, severe AS who was found on the floor at home.  She had dense left-sided weakness, slurred speech, facial droop.  She required intubation upon arrival to the ER.  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  MRI brain showed acute right MCA territory infarct with 2 mm leftward midline shift.  She was able to be weaned off pressors and extubated.  Also evaluated by vascular surgery.  Subsequently she is not able to take any oral intake and has a core track.  Given her inability to advance her diet initially PEG tube was recommended with tentative plan of placement on 03/17/2024 per the IR team. MBS done and now on D1 Honey Thick Liquid Diet and will see if she tolerates and calorie count initiated. TF being adjusted by Dietary team.    PT/OT recommending CIR but may not be a candidate and may need to pursue SNF and other venues.  Assessment and Plan:   Subacute R MCA CVA  Dysphagia as a result of CVA -Presented outside of window for intervention. Etiology felt most likely cardioembolic  - Appreciate stroke team's management.  Agree that stroke was likely cardioembolic, although no LV thrombus seen on echo -SBP goal is complex:  enlarging aortic aneurysms, hx AS -- need to avoid significant HTN with rupture risk. Goal SBP 100-140  -Cortrak; continue TF for now; SLP will continue following -Unable to perform MBS on 1/5 due to her agitation and unable to follow prompts; will discuss with family about placing PEG tube at this point as she is likely to need and if does  happen to progress in the future, can be removed; do not think family wants to be anything other than aggressive at this point -potential CIR candidate; has significant dense left hemiplegia with neglect; if declined, will need SNF pursuit after PEG placement; per to the inpatient rehabilitation coordinator she has low tolerance for and slow progression with therapy and they are likely cannot recommend other venues but we will continue to check on her in house -C/w Atorvastatin  80 mg per Tube Daily for now but may need to hold given Abnormal LFTs - Asa on hold 1/6 - 1/12 in anticipation of PEG on 1/12, then resume ASA 1/13 -Continue Eliquis  thru 1/9; hold 48 hrs for PEG on 1/12 and resume on 1/13 - IR requesting repeat SLP eval prior to PEG.  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  -not a candidate for open or endovascular repair at this time. See VVS note 12/28 for details   Hx NICM  HFrEF -- LVEF 25-30%  Severe AS -Not a TAVR candidate  -GDMT limited at this time w renal dysfxn; Holding w/ ASA 81 mg per NG Tube Daily and resuming 1/13; C/w Atrovastatin 80 mg per Tube Daily, and Carvedilol  12.5 mg per Tube BID   AKI on CKD3b,  improving  Elevated CK, improving  -D/C'd Foley;  Renal function improving and back to baseline. BUN/Cr Trend: Recent Labs  Lab 03/04/24 0458 03/05/24 0407 03/06/24 0450 03/12/24 0502 03/13/24 0617 03/14/24 0409 03/15/24 0312  BUN 39* 36* 33* 35* 30* 36* 35*  CREATININE 1.72* 1.63* 1.41* 1.00 1.02* 1.06* 1.05*  -IVF now stopped and getting TF as below -Avoid Nephrotoxic Medications, Contrast Dyes, Hypotension and Dehydration to Ensure Adequate Renal Perfusion and will need to Renally Adjust Meds -Continue to Monitor and Trend Renal Function carefully and repeat CMP in the AM   Abnormal LFTs: AST and ALT Trend mildly elevated and now improving. AST went from 34 -> 71 -> 60 -> 53. ALT went from 16 -> 81 -> 69 -> 56. Likely reactive. CTM and Trend and may need to hold Atorvastatin  per Tube given Elevation but will continue for now.   Hypernatremia -> Hyponatremia: Improved. Na+ Trend improved and went from 150 -> 136. CTM and Trend and repeat CMP in the AM    DM2 w Hyperglycemia: C/w Moderate Novolog  SSI q4h. CTM CBGs per Protocol. CBG Trend:  Recent Labs  Lab 03/14/24 1128 03/14/24 1631 03/14/24 2008 03/15/24 0013 03/15/24 0439 03/15/24 0729 03/15/24 1225  GLUCAP 83 112* 131* 120* 127* 101* 128*   Leukocytosis + low grade temp: Resolved on last check is WBC is 9.4. Some upper lobe rhonchi, plausible aspirations  -MRSA PCR neg. Had a foley but removed. Augmentin  course completed. WBC Trend:  Recent Labs  Lab 03/04/24 0458 03/05/24 0407 03/06/24 0450 03/11/24 0322 03/13/24 0617 03/14/24 0409 03/15/24 0312  WBC 14.1* 14.2* 11.7* 10.2 9.2 9.4 9.4  -Continue to monitor for signs and symptoms of infection  Normocytic Anemia: Hgb/Hct Trend fairly stable:  Recent Labs  Lab 03/04/24 0458 03/05/24 0407 03/06/24 0450 03/11/24 0322 03/13/24 0617 03/14/24 0409 03/15/24 0312  HGB 10.3* 10.5* 10.5* 10.7* 10.1* 10.2* 9.7*  HCT 33.4* 33.5* 34.8* 34.8* 32.1* 32.6* 31.0*  MCV  92.3 93.6 94.1 91.1 89.4 90.6 90.4  -Checked Anemia Panel and showed an iron  level of 27, UIBC of 230, TIBC 58, saturation of 10%, ferritin of 344, folate level 8.7 and vitamin B12 1503.  CTM for S/Sx of Bleeding; No overt bleeding noted. Repeat CBC in the AM   Thrombocytosis: Mild and in the setting of above. Plt Count Trend:  Recent Labs  Lab 03/04/24 0458 03/05/24 0407 03/06/24 0450 03/11/24 0322 03/13/24 0617 03/14/24 0409 03/15/24 0312  PLT 189 231 280 484* 531* 559* 547*  -CTM and Trend and repeat CBC In the AM  Non-Severe (Moderate) Malnutrition in the context of chronic illness: Nutrition Status: Nutrition Problem: Moderate Malnutrition Etiology: chronic illness Signs/Symptoms: mild fat depletion, mild muscle depletion Interventions: Refer to RD note for recommendations - Currently getting tube feedings via core track with Osmolite 1.5 at 50 mL/h for 12 hours from 1800 -> 0600 and continuing free water  flushes 200 mL every 4 scheduled.  She is also getting  Juven twice daily to support wound healing and calorie count has been placed to assess the need for PEG tube placement and currently has not really eaten anything today  Hypoalbuminemia: Patient's Albumin  Level went from 4.0 -> 3.1 on the last check. CTM & Trend & repeat CMP in the AM  Class I Obesity: Complicates overall prognosis and care. Estimated body mass index is 32.84 kg/m as calculated from the following:   Height as of this encounter: 5' 7 (1.702 m).   Weight as of this encounter: 95.1 kg. Weight Loss and Dietary Counseling given   DVT prophylaxis: SCDs Start: 03/01/24 1745 apixaban  (ELIQUIS ) tablet 5 mg    Code Status: Full Code Family Communication: No family present @ bedside   Disposition Plan:  Level of care: Med-Surg Status is: Inpatient Remains inpatient appropriate because: Needs further clinical improvement and evaluation to see if she needs a PEG or not.  PT OT recommending CIR but not a candidate  so we will recommending SNF   Consultants:  Neurology Interventional Radiology Vascular Surgery  Procedures:  As delineated as above  Antimicrobials:  Anti-infectives (From admission, onward)    Start     Dose/Rate Route Frequency Ordered Stop   03/17/24 0800  ceFAZolin  (ANCEF ) IVPB 2g/100 mL premix        2 g 200 mL/hr over 30 Minutes Intravenous To Radiology 03/11/24 1611 03/18/24 0800   03/05/24 1130  amoxicillin -clavulanate (AUGMENTIN ) 875-125 MG per tablet 1 tablet        1 tablet Per Tube Every 12 hours 03/05/24 1036 03/07/24 2101   03/03/24 0930  cefTRIAXone  (ROCEPHIN ) 2 g in sodium chloride  0.9 % 100 mL IVPB  Status:  Discontinued        2 g 200 mL/hr over 30 Minutes Intravenous Every 24 hours 03/03/24 0838 03/05/24 1036       Subjective: Seen and examined at bedside and she is resting and somnolent drowsy.  Nursing states that she ate everything this morning for her breakfast.  No nausea vomiting.  No complaints at this time.  Objective: Vitals:   03/14/24 2005 03/15/24 0442 03/15/24 0500 03/15/24 0734  BP: 118/80 113/83  (!) 127/96  Pulse: 72 66  69  Resp: 17 17  16   Temp: 98.7 F (37.1 C) 98.8 F (37.1 C)  98.6 F (37 C)  TempSrc: Oral Oral  Oral  SpO2: 100% 99%  99%  Weight:   95.1 kg   Height:        Intake/Output Summary (Last 24 hours) at 03/15/2024 1448 Last data filed at 03/15/2024 1251 Gross per 24 hour  Intake 1306.67 ml  Output 700 ml  Net 606.67 ml   Filed Weights   03/13/24 0718 03/14/24 0546 03/15/24 0500  Weight: 96 kg 95.8 kg 95.1 kg   Examination: Physical Exam:  Constitutional: WN/WD obese African-American female who is resting in no acute distress and a little drowsy Respiratory: Diminished to auscultation bilaterally, no wheezing, rales, rhonchi or crackles. Normal respiratory effort and patient is not tachypenic. No accessory muscle use.  Unlabored breathing Cardiovascular: RRR, no murmurs / rubs / gallops. S1 and S2 auscultated.  No extremity edema.  Abdomen: Soft, non-tender, distended secondary body habitus. Bowel sounds positive.  GU: Deferred. Musculoskeletal: No clubbing / cyanosis of digits/nails. No joint deformity upper and lower extremities.  Skin: No rashes, lesions, ulcers limited skin evaluation. No induration; Warm and dry.  Neurologic: She is somnolent and drowsy and continues to have hemiplegia on the  left. Psychiatric: Appears calm and little drowsy   Data Reviewed: I have personally reviewed following labs and imaging studies  CBC: Recent Labs  Lab 03/11/24 0322 03/13/24 0617 03/14/24 0409 03/15/24 0312  WBC 10.2 9.2 9.4 9.4  NEUTROABS  --  6.7 7.0 6.9  HGB 10.7* 10.1* 10.2* 9.7*  HCT 34.8* 32.1* 32.6* 31.0*  MCV 91.1 89.4 90.6 90.4  PLT 484* 531* 559* 547*   Basic Metabolic Panel: Recent Labs  Lab 03/12/24 0502 03/13/24 0617 03/14/24 0409 03/15/24 0312  NA 134* 135 136 136  K 4.7 4.7 4.7 4.7  CL 101 102 102 103  CO2 25 26 24 24   GLUCOSE 129* 129* 102* 119*  BUN 35* 30* 36* 35*  CREATININE 1.00 1.02* 1.06* 1.05*  CALCIUM  9.6 9.9 9.7 9.6  MG  --  2.3 2.2 2.2  PHOS  --  3.4 3.5 3.2   GFR: Estimated Creatinine Clearance: 57.3 mL/min (A) (by C-G formula based on SCr of 1.05 mg/dL (H)). Liver Function Tests: Recent Labs  Lab 03/13/24 0617 03/14/24 0409 03/15/24 0312  AST 71* 60* 53*  ALT 81* 69* 56*  ALKPHOS 135* 140* 126  BILITOT 0.5 0.5 0.4  PROT 7.4 7.6 7.2  ALBUMIN  3.1* 3.1* 2.9*   No results for input(s): LIPASE, AMYLASE in the last 168 hours. No results for input(s): AMMONIA in the last 168 hours. Coagulation Profile: No results for input(s): INR, PROTIME in the last 168 hours. Cardiac Enzymes: No results for input(s): CKTOTAL, CKMB, CKMBINDEX, TROPONINI in the last 168 hours. BNP (last 3 results) No results for input(s): PROBNP in the last 8760 hours. HbA1C: No results for input(s): HGBA1C in the last 72 hours. CBG: Recent Labs  Lab  03/14/24 2008 03/15/24 0013 03/15/24 0439 03/15/24 0729 03/15/24 1225  GLUCAP 131* 120* 127* 101* 128*   Lipid Profile: No results for input(s): CHOL, HDL, LDLCALC, TRIG, CHOLHDL, LDLDIRECT in the last 72 hours. Thyroid  Function Tests: No results for input(s): TSH, T4TOTAL, FREET4, T3FREE, THYROIDAB in the last 72 hours. Anemia Panel: Recent Labs    03/14/24 0409  VITAMINB12 1,503*  FOLATE 8.7  FERRITIN 344*  TIBC 258  IRON  27*  RETICCTPCT 2.2   Sepsis Labs: No results for input(s): PROCALCITON, LATICACIDVEN in the last 168 hours.  No results found for this or any previous visit (from the past 240 hours).   Radiology Studies: No results found.  Scheduled Meds:  amLODipine   10 mg Per Tube Daily   [START ON 03/18/2024] apixaban   5 mg Per Tube BID   [START ON 03/18/2024] aspirin   81 mg Per NG tube Daily   atorvastatin   80 mg Per Tube Daily   carvedilol   12.5 mg Per Tube BID   docusate  100 mg Per Tube BID   feeding supplement (OSMOLITE 1.5 CAL)  1,000 mL Per Tube Q24H   free water   200 mL Per Tube Q4H   insulin  aspart  0-15 Units Subcutaneous Q4H   nutrition supplement (JUVEN)  1 packet Per Tube BID BM   OLANZapine   5 mg Per Tube QHS   mouth rinse  15 mL Mouth Rinse 4 times per day   polyethylene glycol  17 g Per Tube Daily   Continuous Infusions:  [START ON 03/17/2024]  ceFAZolin  (ANCEF ) IV      LOS: 14 days   Alejandro Marker, DO [facility] Available via Epic secure chat 7am-7pm After these hours, please refer to coverage provider listed on [email or web address] 03/15/2024, 2:48 PM  "

## 2024-03-15 NOTE — Plan of Care (Signed)

## 2024-03-16 DIAGNOSIS — I739 Peripheral vascular disease, unspecified: Secondary | ICD-10-CM | POA: Diagnosis not present

## 2024-03-16 DIAGNOSIS — I10 Essential (primary) hypertension: Secondary | ICD-10-CM | POA: Diagnosis not present

## 2024-03-16 DIAGNOSIS — R1310 Dysphagia, unspecified: Secondary | ICD-10-CM | POA: Diagnosis not present

## 2024-03-16 DIAGNOSIS — I639 Cerebral infarction, unspecified: Secondary | ICD-10-CM | POA: Diagnosis not present

## 2024-03-16 DIAGNOSIS — E440 Moderate protein-calorie malnutrition: Secondary | ICD-10-CM | POA: Diagnosis not present

## 2024-03-16 DIAGNOSIS — R471 Dysarthria and anarthria: Secondary | ICD-10-CM | POA: Diagnosis not present

## 2024-03-16 DIAGNOSIS — I350 Nonrheumatic aortic (valve) stenosis: Secondary | ICD-10-CM | POA: Diagnosis not present

## 2024-03-16 LAB — CBC WITH DIFFERENTIAL/PLATELET
Abs Immature Granulocytes: 0.03 K/uL (ref 0.00–0.07)
Basophils Absolute: 0 K/uL (ref 0.0–0.1)
Basophils Relative: 0 %
Eosinophils Absolute: 0.3 K/uL (ref 0.0–0.5)
Eosinophils Relative: 3 %
HCT: 31.7 % — ABNORMAL LOW (ref 36.0–46.0)
Hemoglobin: 9.8 g/dL — ABNORMAL LOW (ref 12.0–15.0)
Immature Granulocytes: 0 %
Lymphocytes Relative: 13 %
Lymphs Abs: 1.1 K/uL (ref 0.7–4.0)
MCH: 28 pg (ref 26.0–34.0)
MCHC: 30.9 g/dL (ref 30.0–36.0)
MCV: 90.6 fL (ref 80.0–100.0)
Monocytes Absolute: 0.6 K/uL (ref 0.1–1.0)
Monocytes Relative: 7 %
Neutro Abs: 6.5 K/uL (ref 1.7–7.7)
Neutrophils Relative %: 77 %
Platelets: 514 K/uL — ABNORMAL HIGH (ref 150–400)
RBC: 3.5 MIL/uL — ABNORMAL LOW (ref 3.87–5.11)
RDW: 14.8 % (ref 11.5–15.5)
WBC: 8.6 K/uL (ref 4.0–10.5)
nRBC: 0 % (ref 0.0–0.2)

## 2024-03-16 LAB — GLUCOSE, CAPILLARY
Glucose-Capillary: 109 mg/dL — ABNORMAL HIGH (ref 70–99)
Glucose-Capillary: 123 mg/dL — ABNORMAL HIGH (ref 70–99)
Glucose-Capillary: 128 mg/dL — ABNORMAL HIGH (ref 70–99)
Glucose-Capillary: 134 mg/dL — ABNORMAL HIGH (ref 70–99)
Glucose-Capillary: 136 mg/dL — ABNORMAL HIGH (ref 70–99)
Glucose-Capillary: 142 mg/dL — ABNORMAL HIGH (ref 70–99)

## 2024-03-16 LAB — COMPREHENSIVE METABOLIC PANEL WITH GFR
ALT: 46 U/L — ABNORMAL HIGH (ref 0–44)
AST: 43 U/L — ABNORMAL HIGH (ref 15–41)
Albumin: 3 g/dL — ABNORMAL LOW (ref 3.5–5.0)
Alkaline Phosphatase: 124 U/L (ref 38–126)
Anion gap: 7 (ref 5–15)
BUN: 34 mg/dL — ABNORMAL HIGH (ref 8–23)
CO2: 27 mmol/L (ref 22–32)
Calcium: 10 mg/dL (ref 8.9–10.3)
Chloride: 104 mmol/L (ref 98–111)
Creatinine, Ser: 1.05 mg/dL — ABNORMAL HIGH (ref 0.44–1.00)
GFR, Estimated: 56 mL/min — ABNORMAL LOW
Glucose, Bld: 126 mg/dL — ABNORMAL HIGH (ref 70–99)
Potassium: 4.5 mmol/L (ref 3.5–5.1)
Sodium: 138 mmol/L (ref 135–145)
Total Bilirubin: 0.4 mg/dL (ref 0.0–1.2)
Total Protein: 7.3 g/dL (ref 6.5–8.1)

## 2024-03-16 LAB — PHOSPHORUS: Phosphorus: 2.9 mg/dL (ref 2.5–4.6)

## 2024-03-16 LAB — MAGNESIUM: Magnesium: 2.1 mg/dL (ref 1.7–2.4)

## 2024-03-16 MED ORDER — NICOTINE 14 MG/24HR TD PT24
14.0000 mg | MEDICATED_PATCH | Freq: Every day | TRANSDERMAL | Status: DC
  Administered 2024-03-16 – 2024-03-21 (×6): 14 mg via TRANSDERMAL
  Filled 2024-03-16 (×6): qty 1

## 2024-03-16 NOTE — Progress Notes (Signed)
 " PROGRESS NOTE    Erin Riley  FMW:[id number] DOB: [birth year] DOA: 03/01/2024 PCP: [name], MD   Brief Narrative:  [name] is a 74 yo female with PMH CAD, CKD, HLD, HTN, PAD, schizophrenia, severe AS who was found on the floor at home.  She had dense left-sided weakness, slurred speech, facial droop.  She required intubation upon arrival to the ER.  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  MRI brain showed acute right MCA territory infarct with 2 mm leftward midline shift.  She was able to be weaned off pressors and extubated.  Also evaluated by vascular surgery.  Subsequently she is not able to take any oral intake and has a core track.  Given her inability to advance her diet initially PEG tube was recommended with tentative plan of placement on 03/17/2024 per the IR team. MBS done and now on D1 Honey Thick Liquid Diet and will see if she tolerates and calorie count initiated and she seems to be doing well with oral intake. TF being adjusted by Dietary team.    PT/OT recommending CIR but may not be a candidate and may need to pursue SNF and other venues.  Assessment and Plan:   Subacute R MCA CVA  Dysphagia as a result of CVA -Presented outside of window for intervention. Etiology felt most likely cardioembolic  - Appreciate stroke team's management.  Agree that stroke was likely cardioembolic, although no LV thrombus seen on echo -SBP goal is complex:  enlarging aortic aneurysms, hx AS -- need to avoid significant HTN with rupture risk. Goal SBP 100-140  -Cortrak; continue TF for now; SLP will continue following -Unable to perform MBS on 1/5 due to her agitation and unable to follow prompts; will discuss with family about placing PEG tube at  this point as she is likely to need and if does happen to progress in the future, can be removed; do not think family wants to be anything other than aggressive at this point -potential CIR candidate; has significant dense left hemiplegia with neglect; if declined, will need SNF pursuit after PEG placement; per to the inpatient rehabilitation coordinator she has low tolerance for and slow progression with therapy and they are likely cannot recommend other venues but we will continue to check on her in house -C/w Atorvastatin  80 mg per Tube Daily for now but may need to hold given Abnormal LFTs - Asa on hold 1/6 - 1/12 in anticipation of PEG on 1/12, then resume ASA 1/13 -Continued Eliquis  thru 1/9; held 48 hrs for possibl PEG on 1/12 and resume on 1/13 but she is tolerating a diet quite well -IR was requesting repeat SLP eval prior to PEG.  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  -not a candidate for open or endovascular repair at this time. See VVS note 12/28 for details   Hx NICM  HFrEF -- LVEF 25-30%  Severe AS -Not a TAVR candidate  -GDMT limited at this time w renal dysfxn; Holding w/ ASA 81 mg per NG Tube Daily and resuming 1/13; C/w Atrovastatin 80 mg per Tube Daily, and Carvedilol  12.5 mg per Tube BID   AKI  on CKD3b, improving  Elevated CK, improving  -D/C'd Foley;  Renal function improving and back to baseline. BUN/Cr Trend: Recent Labs  Lab 03/05/24 0407 03/06/24 0450 03/12/24 0502 03/13/24 0617 03/14/24 0409 03/15/24 0312 03/16/24 0921  BUN 36* 33* 35* 30* 36* 35* 34*  CREATININE 1.63* 1.41* 1.00 1.02* 1.06* 1.05* 1.05*  -IVF now stopped and getting TF as below -Avoid Nephrotoxic Medications, Contrast Dyes, Hypotension and Dehydration to Ensure Adequate Renal Perfusion and will need to Renally Adjust Meds. CTM & Trend Renal Function carefully & repeat CMP in the AM   Abnormal LFTs: AST and ALT Trend mildly elevated and now improving. AST went from 34 -> 71 -> 60 -> 53 -> 43. ALT went from 16 -> 81 -> 69 -> 56 -> 46. Likely reactive. CTM and Trend and may need to hold Atorvastatin  per Tube given Elevation but will continue for now.   Hypernatremia -> Hyponatremia: Improved. Na+ Trend improved and went from 150 -> 136. CTM and Trend and repeat CMP in the AM    DM2 w Hyperglycemia: C/w Moderate Novolog  SSI q4h. CTM CBGs per Protocol. CBG Trend ranging from 109-136  Leukocytosis + low grade temp: Resolved on last check is WBC is 9.4. Some upper lobe rhonchi, plausible aspirations  -MRSA PCR neg. Had a foley but removed. Augmentin  course completed. WBC Trend:  Recent Labs  Lab 03/05/24 0407 03/06/24 0450 03/11/24 0322 03/13/24 0617 03/14/24 0409 03/15/24 0312 03/16/24 0921  WBC 14.2* 11.7* 10.2 9.2 9.4 9.4 8.6  -CTM for S/Sx of Infection. Repeat CBC in the AM  Normocytic Anemia: Hgb/Hct Trend fairly stable but slightly lower:  Recent Labs  Lab 03/05/24 0407 03/06/24 0450 03/11/24 0322 03/13/24 0617 03/14/24 0409 03/15/24 0312 03/16/24 0921  HGB 10.5* 10.5* 10.7* 10.1* 10.2* 9.7* 9.8*  HCT 33.5* 34.8* 34.8* 32.1* 32.6* 31.0* 31.7*  MCV 93.6 94.1 91.1 89.4 90.6 90.4 90.6  -Checked Anemia Panel and showed an iron  level of 27, UIBC of 230, TIBC 58, saturation of 10%, ferritin  of 344, folate level 8.7 and vitamin B12 1503.  CTM for S/Sx of Bleeding; No overt bleeding noted. Repeat CBC in the AM   Thrombocytosis: Mild and in the setting of above. Plt Count Trend peaked at 559 and is now 514. CTM and Trend and repeat CBC In the AM  Non-Severe (Moderate) Malnutrition in the context of chronic illness: Nutrition Status: Nutrition Problem: Moderate Malnutrition Etiology: chronic illness Signs/Symptoms: mild fat depletion, mild muscle depletion Interventions: Refer to RD note for recommendations - Currently getting tube feedings via core track with Osmolite 1.5 at 50 mL/h for 12 hours from 1800 -> 0600 and continuing free water  flushes 200 mL every 4 scheduled.  She is also getting Juven twice daily to support wound healing and calorie count has been placed to assess the need for PEG tube placement and currently has not really eaten anything today  Hypoalbuminemia: Patient's Albumin  Level is now 3.0 on the last check. CTM & Trend &  repeat CMP in the AM  Class I Obesity: Complicates overall prognosis and care. Estimated body mass index is 33.22 kg/m as calculated from the following:   Height as of this encounter: 5' 7 (1.702 m).   Weight as of this encounter: 96.2 kg. Weight Loss and Dietary Counseling given   DVT prophylaxis: SCDs Start: 03/01/24 1745 apixaban  (ELIQUIS ) tablet 5 mg    Code Status: Full Code Family Communication: No family present @ bedside   Disposition Plan:  Level of care: Med-Surg Status is: Inpatient Remains inpatient appropriate because: Needs further clinical improvement and evaluation to see if she needs a PEG or not. PT OT recommending CIR but not a candidate so we will recommending SNF    Consultants:  Neurology Interventional Radiology Vascular Surgery  Procedures:  As delineated as above  Antimicrobials:  Anti-infectives (From admission, onward)    Start     Dose/Rate Route Frequency Ordered Stop   03/17/24 0800  ceFAZolin   (ANCEF ) IVPB 2g/100 mL premix        2 g 200 mL/hr over 30 Minutes Intravenous To Radiology 03/11/24 1611 03/18/24 0800   03/05/24 1130  amoxicillin -clavulanate (AUGMENTIN ) 875-125 MG per tablet 1 tablet        1 tablet Per Tube Every 12 hours 03/05/24 1036 03/07/24 2101   03/03/24 0930  cefTRIAXone  (ROCEPHIN ) 2 g in sodium chloride  0.9 % 100 mL IVPB  Status:  Discontinued        2 g 200 mL/hr over 30 Minutes Intravenous Every 24 hours 03/03/24 0838 03/05/24 1036       Subjective: Seen and examined at bedside and she is resting and awoken from her sleep and feels okay.  Wanting to go to the restroom.  No nausea or vomiting.  Nursing states that she is eating quite well and ate basically all of her food.  No other concerns or complaints at this time  Objective: Vitals:   03/16/24 0404 03/16/24 0459 03/16/24 0752 03/16/24 1113  BP: (!) 163/85  136/83 128/85  Pulse: 68  73 69  Resp: 17     Temp: 98.3 F (36.8 C)  98.2 F (36.8 C) 98.2 F (36.8 C)  TempSrc: Skin     SpO2: 99%  98% 100%  Weight:  96.2 kg    Height:        Intake/Output Summary (Last 24 hours) at 03/16/2024 1550 Last data filed at 03/16/2024 1417 Gross per 24 hour  Intake 1898 ml  Output --  Net 1898 ml   Filed Weights   03/14/24 0546 03/15/24 0500 03/16/24 0459  Weight: 95.8 kg 95.1 kg 96.2 kg   Examination: Physical Exam:  Constitutional: WN/WD obese African-American female is resting but wanting to go to the bathroom Respiratory: Diminished to auscultation bilaterally with some coarse breath sounds no wheezing, rales, rhonchi or crackles. Normal respiratory effort and patient is not tachypenic. No accessory muscle use.  Unlabored breathing Cardiovascular: RRR, no murmurs / rubs / gallops. S1 and S2 auscultated. No extremity edema.  Abdomen: Soft, non-tender, non-distended. Distended secondary to body habitus. Bowel sounds positive.  GU: Deferred. Musculoskeletal: No clubbing / cyanosis of digits/nails. No  joint deformity upper and lower extremities. Skin: No rashes, lesions, ulcers limited on a detailed skin evaluation. No induration; Warm and dry.  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 and severe dysarthria Psychiatric: Awake and alert and appears calm  Data Reviewed: I have personally reviewed following labs and imaging studies  CBC: Recent Labs  Lab 03/11/24 0322 03/13/24 0617 03/14/24 0409 03/15/24 0312 03/16/24 0921  WBC 10.2 9.2 9.4 9.4 8.6  NEUTROABS  --  6.7 7.0 6.9 6.5  HGB 10.7* 10.1* 10.2* 9.7* 9.8*  HCT 34.8* 32.1* 32.6* 31.0* 31.7*  MCV 91.1 89.4 90.6 90.4 90.6  PLT 484* 531* 559* 547* 514*   Basic Metabolic Panel: Recent Labs  Lab 03/12/24 0502 03/13/24 0617 03/14/24 0409 03/15/24 0312 03/16/24 0921  NA 134* 135 136 136 138  K 4.7 4.7 4.7 4.7 4.5  CL 101 102 102 103 104  CO2 25 26 24 24 27   GLUCOSE 129* 129* 102* 119* 126*  BUN 35* 30* 36* 35* 34*  CREATININE 1.00 1.02* 1.06* 1.05* 1.05*  CALCIUM  9.6 9.9 9.7 9.6 10.0  MG  --  2.3 2.2 2.2 2.1  PHOS  --  3.4 3.5 3.2 2.9   GFR: Estimated Creatinine Clearance: 57.6 mL/min (A) (by C-G formula based on SCr of 1.05 mg/dL (H)). Liver Function Tests: Recent Labs  Lab 03/13/24 0617 03/14/24 0409 03/15/24 0312 03/16/24 0921  AST 71* 60* 53* 43*  ALT 81* 69* 56* 46*  ALKPHOS 135* 140* 126 124  BILITOT 0.5 0.5 0.4 0.4  PROT 7.4 7.6 7.2 7.3  ALBUMIN  3.1* 3.1* 2.9* 3.0*   No results for input(s): LIPASE, AMYLASE in the last 168 hours. No results for input(s): AMMONIA in the last 168 hours. Coagulation Profile: No results for input(s): INR, PROTIME in the last 168 hours. Cardiac Enzymes: No results for input(s): CKTOTAL, CKMB, CKMBINDEX, TROPONINI in the last 168 hours. BNP (last 3 results) No results for input(s): PROBNP in the last 8760 hours. HbA1C: No results for input(s): HGBA1C in the last 72 hours. CBG: Recent Labs  Lab 03/15/24 2025 03/16/24 0050  03/16/24 0407 03/16/24 0750 03/16/24 1113  GLUCAP 110* 136* 123* 128* 109*   Lipid Profile: No results for input(s): CHOL, HDL, LDLCALC, TRIG, CHOLHDL, LDLDIRECT in the last 72 hours. Thyroid  Function Tests: No results for input(s): TSH, T4TOTAL, FREET4, T3FREE, THYROIDAB in the last 72 hours. Anemia Panel: Recent Labs    03/14/24 0409  VITAMINB12 1,503*  FOLATE 8.7  FERRITIN 344*  TIBC 258  IRON  27*  RETICCTPCT 2.2   Sepsis Labs: No results for input(s): PROCALCITON, LATICACIDVEN in the last 168 hours.  No results found for this or any previous visit (from the past 240 hours).   Radiology Studies: No results found.  Scheduled Meds:  amLODipine   10 mg Per Tube Daily   [START ON 03/18/2024] apixaban   5 mg Per Tube BID   [START ON 03/18/2024] aspirin   81 mg Per NG tube Daily   atorvastatin   80 mg Per Tube Daily   carvedilol   12.5 mg Per Tube BID   docusate  100 mg Per Tube BID   feeding supplement (OSMOLITE 1.5 CAL)  1,000 mL Per Tube Q24H   free water   200 mL Per Tube Q4H   insulin  aspart  0-15 Units Subcutaneous Q4H   nutrition supplement (JUVEN)  1 packet Per Tube BID BM   OLANZapine   5 mg Per Tube QHS   mouth rinse  15 mL Mouth Rinse 4 times per day   polyethylene glycol  17 g Per Tube Daily   Continuous Infusions:  [START ON 03/17/2024]  ceFAZolin  (ANCEF ) IV      LOS: 15 days   Alejandro Marker, DO [facility] Available via Epic secure chat 7am-7pm After these hours, please refer to coverage provider listed on [email or web address]  03/16/2024, 3:50 PM  "

## 2024-03-16 NOTE — Plan of Care (Signed)
" °  Problem: Education: Goal: Ability to describe self-care measures that may prevent or decrease complications (Diabetes Survival Skills Education) will improve Outcome: Not Applicable Goal: Individualized Educational Video(s) Outcome: Not Applicable   Problem: Coping: Goal: Ability to adjust to condition or change in health will improve Outcome: Not Applicable   Problem: Fluid Volume: Goal: Ability to maintain a balanced intake and output will improve Outcome: Not Applicable   Problem: Health Behavior/Discharge Planning: Goal: Ability to identify and utilize available resources and services will improve Outcome: Not Applicable Goal: Ability to manage health-related needs will improve Outcome: Not Applicable   Problem: Metabolic: Goal: Ability to maintain appropriate glucose levels will improve Outcome: Not Applicable   Problem: Nutritional: Goal: Maintenance of adequate nutrition will improve Outcome: Not Applicable Goal: Progress toward achieving an optimal weight will improve Outcome: Not Applicable   Problem: Skin Integrity: Goal: Risk for impaired skin integrity will decrease Outcome: Not Applicable   Problem: Tissue Perfusion: Goal: Adequacy of tissue perfusion will improve Outcome: Not Applicable   Problem: Education: Goal: Knowledge of disease or condition will improve Outcome: Not Applicable Goal: Knowledge of secondary prevention will improve (MUST DOCUMENT ALL) Outcome: Not Applicable Goal: Knowledge of patient specific risk factors will improve (DELETE if not current risk factor) Outcome: Not Applicable   Problem: Ischemic Stroke/TIA Tissue Perfusion: Goal: Complications of ischemic stroke/TIA will be minimized Outcome: Not Applicable   Problem: Coping: Goal: Will verbalize positive feelings about self Outcome: Not Applicable Goal: Will identify appropriate support needs Outcome: Not Applicable   Problem: Health Behavior/Discharge Planning: Goal:  Ability to manage health-related needs will improve Outcome: Not Applicable Goal: Goals will be collaboratively established with patient/family Outcome: Not Applicable   Problem: Self-Care: Goal: Ability to participate in self-care as condition permits will improve Outcome: Not Applicable Goal: Verbalization of feelings and concerns over difficulty with self-care will improve Outcome: Not Applicable Goal: Ability to communicate needs accurately will improve Outcome: Not Applicable   Problem: Nutrition: Goal: Risk of aspiration will decrease Outcome: Not Applicable Goal: Dietary intake will improve Outcome: Not Applicable   Problem: Education: Goal: Knowledge of General Education information will improve Description: Including pain rating scale, medication(s)/side effects and non-pharmacologic comfort measures Outcome: Not Applicable   Problem: Health Behavior/Discharge Planning: Goal: Ability to manage health-related needs will improve Outcome: Not Applicable   Problem: Clinical Measurements: Goal: Ability to maintain clinical measurements within normal limits will improve Outcome: Not Applicable Goal: Will remain free from infection Outcome: Not Applicable Goal: Diagnostic test results will improve Outcome: Not Applicable Goal: Respiratory complications will improve Outcome: Not Applicable Goal: Cardiovascular complication will be avoided Outcome: Not Applicable   Problem: Activity: Goal: Risk for activity intolerance will decrease Outcome: Not Applicable   Problem: Nutrition: Goal: Adequate nutrition will be maintained Outcome: Not Applicable   Problem: Coping: Goal: Level of anxiety will decrease Outcome: Not Applicable   Problem: Elimination: Goal: Will not experience complications related to bowel motility Outcome: Not Applicable Goal: Will not experience complications related to urinary retention Outcome: Not Applicable   Problem: Pain Managment: Goal:  General experience of comfort will improve and/or be controlled Outcome: Not Applicable   Problem: Safety: Goal: Ability to remain free from injury will improve Outcome: Not Applicable   Problem: Skin Integrity: Goal: Risk for impaired skin integrity will decrease Outcome: Not Applicable   "

## 2024-03-16 NOTE — Progress Notes (Signed)
 IR Procedure Request G Tube Placement  Per Team Patient currently able to tolerate advanced diet. Procedure request for gastrostomy tube placement will be deleted at this time. Should Team determine that this or any addition IR procedure be warranted please re consult IR.

## 2024-03-17 DIAGNOSIS — I639 Cerebral infarction, unspecified: Secondary | ICD-10-CM | POA: Diagnosis not present

## 2024-03-17 DIAGNOSIS — I10 Essential (primary) hypertension: Secondary | ICD-10-CM | POA: Diagnosis not present

## 2024-03-17 DIAGNOSIS — R1310 Dysphagia, unspecified: Secondary | ICD-10-CM | POA: Diagnosis not present

## 2024-03-17 DIAGNOSIS — R471 Dysarthria and anarthria: Secondary | ICD-10-CM | POA: Diagnosis not present

## 2024-03-17 LAB — GLUCOSE, CAPILLARY
Glucose-Capillary: 103 mg/dL — ABNORMAL HIGH (ref 70–99)
Glucose-Capillary: 108 mg/dL — ABNORMAL HIGH (ref 70–99)
Glucose-Capillary: 128 mg/dL — ABNORMAL HIGH (ref 70–99)
Glucose-Capillary: 91 mg/dL (ref 70–99)
Glucose-Capillary: 97 mg/dL (ref 70–99)
Glucose-Capillary: 99 mg/dL (ref 70–99)

## 2024-03-17 LAB — CBC WITH DIFFERENTIAL/PLATELET
Abs Immature Granulocytes: 0.02 K/uL (ref 0.00–0.07)
Basophils Absolute: 0 K/uL (ref 0.0–0.1)
Basophils Relative: 0 %
Eosinophils Absolute: 0.2 K/uL (ref 0.0–0.5)
Eosinophils Relative: 2 %
HCT: 32.1 % — ABNORMAL LOW (ref 36.0–46.0)
Hemoglobin: 10 g/dL — ABNORMAL LOW (ref 12.0–15.0)
Immature Granulocytes: 0 %
Lymphocytes Relative: 16 %
Lymphs Abs: 1.3 K/uL (ref 0.7–4.0)
MCH: 28.2 pg (ref 26.0–34.0)
MCHC: 31.2 g/dL (ref 30.0–36.0)
MCV: 90.7 fL (ref 80.0–100.0)
Monocytes Absolute: 0.6 K/uL (ref 0.1–1.0)
Monocytes Relative: 7 %
Neutro Abs: 6 K/uL (ref 1.7–7.7)
Neutrophils Relative %: 75 %
Platelets: 560 K/uL — ABNORMAL HIGH (ref 150–400)
RBC: 3.54 MIL/uL — ABNORMAL LOW (ref 3.87–5.11)
RDW: 14.7 % (ref 11.5–15.5)
WBC: 8.2 K/uL (ref 4.0–10.5)
nRBC: 0 % (ref 0.0–0.2)

## 2024-03-17 LAB — COMPREHENSIVE METABOLIC PANEL WITH GFR
ALT: 38 U/L (ref 0–44)
AST: 39 U/L (ref 15–41)
Albumin: 3 g/dL — ABNORMAL LOW (ref 3.5–5.0)
Alkaline Phosphatase: 123 U/L (ref 38–126)
Anion gap: 8 (ref 5–15)
BUN: 32 mg/dL — ABNORMAL HIGH (ref 8–23)
CO2: 26 mmol/L (ref 22–32)
Calcium: 9.9 mg/dL (ref 8.9–10.3)
Chloride: 105 mmol/L (ref 98–111)
Creatinine, Ser: 1 mg/dL (ref 0.44–1.00)
GFR, Estimated: 60 mL/min — ABNORMAL LOW
Glucose, Bld: 97 mg/dL (ref 70–99)
Potassium: 4.6 mmol/L (ref 3.5–5.1)
Sodium: 139 mmol/L (ref 135–145)
Total Bilirubin: 0.5 mg/dL (ref 0.0–1.2)
Total Protein: 7.4 g/dL (ref 6.5–8.1)

## 2024-03-17 LAB — PHOSPHORUS: Phosphorus: 3 mg/dL (ref 2.5–4.6)

## 2024-03-17 LAB — PROTIME-INR
INR: 1.3 — ABNORMAL HIGH (ref 0.8–1.2)
Prothrombin Time: 16.7 s — ABNORMAL HIGH (ref 11.4–15.2)

## 2024-03-17 LAB — MAGNESIUM: Magnesium: 2.1 mg/dL (ref 1.7–2.4)

## 2024-03-17 MED ORDER — APIXABAN 5 MG PO TABS
5.0000 mg | ORAL_TABLET | Freq: Two times a day (BID) | ORAL | Status: DC
  Administered 2024-03-17 – 2024-03-21 (×9): 5 mg via ORAL
  Filled 2024-03-17 (×9): qty 1

## 2024-03-17 MED ORDER — CARVEDILOL 12.5 MG PO TABS
12.5000 mg | ORAL_TABLET | Freq: Two times a day (BID) | ORAL | Status: DC
  Administered 2024-03-17 – 2024-03-21 (×8): 12.5 mg via ORAL
  Filled 2024-03-17 (×8): qty 1

## 2024-03-17 MED ORDER — AMLODIPINE BESYLATE 10 MG PO TABS
10.0000 mg | ORAL_TABLET | Freq: Every day | ORAL | Status: DC
  Administered 2024-03-18 – 2024-03-21 (×4): 10 mg via ORAL
  Filled 2024-03-17 (×4): qty 1

## 2024-03-17 MED ORDER — OLANZAPINE 5 MG PO TABS
5.0000 mg | ORAL_TABLET | Freq: Every day | ORAL | Status: DC
  Administered 2024-03-17 – 2024-03-20 (×4): 5 mg via ORAL
  Filled 2024-03-17 (×5): qty 1

## 2024-03-17 MED ORDER — POLYETHYLENE GLYCOL 3350 17 G PO PACK
17.0000 g | PACK | Freq: Every day | ORAL | Status: DC
  Administered 2024-03-18 – 2024-03-21 (×4): 17 g via ORAL
  Filled 2024-03-17 (×4): qty 1

## 2024-03-17 MED ORDER — INSULIN ASPART 100 UNIT/ML IJ SOLN
0.0000 [IU] | Freq: Three times a day (TID) | INTRAMUSCULAR | Status: DC
  Administered 2024-03-18 – 2024-03-20 (×2): 1 [IU] via SUBCUTANEOUS
  Filled 2024-03-17 (×2): qty 1
  Filled 2024-03-17: qty 2

## 2024-03-17 MED ORDER — INSULIN ASPART 100 UNIT/ML IJ SOLN: 0.0000 [IU] | Freq: Every day | INTRAMUSCULAR | Status: DC

## 2024-03-17 MED ORDER — ASPIRIN 81 MG PO CHEW
81.0000 mg | CHEWABLE_TABLET | Freq: Every day | ORAL | Status: DC
  Administered 2024-03-17 – 2024-03-21 (×5): 81 mg via ORAL
  Filled 2024-03-17 (×5): qty 1

## 2024-03-17 MED ORDER — DOCUSATE SODIUM 100 MG PO CAPS
100.0000 mg | ORAL_CAPSULE | Freq: Two times a day (BID) | ORAL | Status: DC
  Administered 2024-03-17 – 2024-03-21 (×6): 100 mg via ORAL
  Filled 2024-03-17 (×7): qty 1

## 2024-03-17 MED ORDER — ATORVASTATIN CALCIUM 80 MG PO TABS
80.0000 mg | ORAL_TABLET | Freq: Every day | ORAL | Status: DC
  Administered 2024-03-18 – 2024-03-21 (×4): 80 mg via ORAL
  Filled 2024-03-17 (×4): qty 1

## 2024-03-17 NOTE — Plan of Care (Signed)

## 2024-03-17 NOTE — Progress Notes (Signed)
 Calorie Count Note  48 hour calorie count ordered.  Diet: Dysphagia 1, Honey Thick Liquids Supplements: none  Estimated Nutritional Needs:  Kcal:  1650-1800 Protein:  70-80 g Fluid:  >/= 1.7L  Day 1 Results 1/9 Lunch: 50% documented (435 calories, 23 gm protein) 1/9 Dinner: no ticket 1/10 Breakfast: 100% documented (521 calories, 30 gm protein) Supplements: none  Total intake: 956 kcal (58% of minimum estimated needs)  53 protein (76% of minimum estimated needs)  Day 2 Results 1/10 Lunch: 100% documented (892 calories, 43 gm protein) 1/10 Dinner: 25% documented (184 calories, 9 gm protein) 1/11 Breakfast: 75% documented (418 calories, 14 gm protein) Supplements: none  Total intake: 1494 kcal (91% of minimum estimated needs)  66 protein (94% of minimum estimated needs)  Day 3 Results 1/11 Lunch: 100% documented (852 calories, 44 gm protein) 1/11 Dinner: 50% documented (451 calories, 18 gm protein) Supplements: none  Total intake: 1303 kcal (79% of minimum estimated needs)  62 protein (86% of minimum estimated needs)  Nutrition Diagnosis: Moderate Malnutrition related to chronic illness as evidenced by mild fat depletion, mild muscle depletion   Goal: Patient will meet greater than or equal to 90% of their needs   Intervention:  Nocturnal TF via Cortrak: Osmolite 1.5 at 50 ml/hr x 12 hours from 1800 to 0600 (600 mL per day) Free water  flushes 200 ml q4h Provides 900 kcal (meet 55% of caloric needs), 38 gm protein (meets 54% of protein needs), 1657 ml free water  daily. Juven BID to support wound healing Feeding assist with all meals   Nestora Glatter RD, LDN Registered Dietitian I Please see AMION for contact information

## 2024-03-17 NOTE — Progress Notes (Signed)
 Physical Therapy Treatment Patient Details Name: Erin Riley MRN: [id number] DOB: 04-Feb-1951 Today's Date: 03/17/2024   History of Present Illness 74 y.o. female admitted 03/01/24 after being found down by son with left sided weakness, slurred speech, facial droop, apnea. MRI showed acute R MCA territory infarct with 2mm leftward midline shift. Chest CTA showed interval enlargement of descending thoracic aortic aneurysm. ETT 12/27-12/28. Cotrak placed due to dysphagia; plan for peg tube placement 1/12. PMH includes HTN, hypertrophic cardiomyopathy, PAD, CKD III, HLD, schizophrenia, AAA.    PT Comments  Pt admitted with above diagnosis. Pt unable to progress much more than last treatment due to significant inattention to left hemibody and pt was perseverating on staying in bed and drinking water . Did position in chair position at end of session and thickened water  and gave to pt.  Pt with significant cognitive deficits as well as left inattention affecting pts progress. Agree with post acute rehab < 3 hours day.  Pt currently with functional limitations due to the deficits listed below (see PT Problem List). Pt will benefit from acute skilled PT to increase their independence and safety with mobility to allow discharge.       If plan is discharge home, recommend the following: Two people to help with walking and/or transfers;Two people to help with bathing/dressing/bathroom;Assistance with cooking/housework;Assistance with feeding;Direct supervision/assist for medications management;Assist for transportation;Direct supervision/assist for financial management;Supervision due to cognitive status   Can travel by Doctor, Hospital (measurements PT);Wheelchair cushion (measurements PT);Hospital bed;Hoyer lift    Recommendations for Other Services       Precautions / Restrictions Precautions Precautions: Fall;Other (comment) Recall of  Precautions/Restrictions: Impaired Precaution/Restrictions Comments: L inattention, LUE hemiplegia, cortrak Restrictions Weight Bearing Restrictions Per Provider Order: No     Mobility  Bed Mobility Overal bed mobility: Needs Assistance Bed Mobility: Rolling, Sidelying to Sit Rolling: Min assist, Used rails Sidelying to sit: Min assist, HOB elevated, +2 for physical assistance, Used rails       General bed mobility comments: minA to initiate BLE movement and trunk elevation, pt needing cues to attend to left UE;   pt rolling self to supine when asked in order to allow for LUE elevation and repositioning to prevent pt from laying on it    Transfers Overall transfer level: Needs assistance Equipment used: 2 person hand held assist, Rolling walker (2 wheels) Transfers: Sit to/from Stand, Bed to chair/wheelchair/BSC Sit to Stand: Total assist, +2 physical assistance, From elevated surface           General transfer comment:  (unable to progress this session as pt not assisting at all even with multiple attempts and multiple techniques offered. Had to lay pt back down.)    Ambulation/Gait               General Gait Details: unable at this time   Stairs             Wheelchair Mobility     Tilt Bed    Modified Rankin (Stroke Patients Only) Modified Rankin (Stroke Patients Only) Pre-Morbid Rankin Score: No symptoms Modified Rankin: Severe disability     Balance Overall balance assessment: Needs assistance Sitting-balance support: No upper extremity supported, Feet supported Sitting balance-Leahy Scale: Good     Standing balance support: Bilateral upper extremity supported, During functional activity Standing balance-Leahy Scale: Zero Standing balance comment: completely dependent on therapist assist  Communication Communication Communication: Impaired Factors Affecting Communication: Difficulty expressing  self;Reduced clarity of speech  Cognition Arousal: Alert Behavior During Therapy: Restless   PT - Cognitive impairments: Awareness, Memory, Attention, Initiation, Sequencing, Problem solving, Safety/Judgement                       PT - Cognition Comments: pt initially alert, conversant, able to state name; Pt able to sit for some time EOB however not following commands with left hemibody due to inattention and pt  laying self back down and seemingly not wanting to interact at times. Following commands: Impaired Following commands impaired: Follows one step commands inconsistently    Cueing Cueing Techniques: Verbal cues, Gestural cues, Tactile cues  Exercises Other Exercises Other Exercises: Pt tolerated PROM of L UE sitting EOB    General Comments General comments (skin integrity, edema, etc.): Pt unable to attend to left hemibody at all with unaware of left UE and left LE even with cues      Pertinent Vitals/Pain Pain Assessment Pain Assessment: No/denies pain Breathing: normal Negative Vocalization: none Facial Expression: smiling or inexpressive Body Language: relaxed Consolability: no need to console PAINAD Score: 0    Home Living                          Prior Function            PT Goals (current goals can now be found in the care plan section) Progress towards PT goals: Progressing toward goals    Frequency    Min 2X/week      PT Plan      Co-evaluation              AM-PAC PT 6 Clicks Mobility   Outcome Measure  Help needed turning from your back to your side while in a flat bed without using bedrails?: A Lot Help needed moving from lying on your back to sitting on the side of a flat bed without using bedrails?: A Lot Help needed moving to and from a bed to a chair (including a wheelchair)?: Total Help needed standing up from a chair using your arms (e.g., wheelchair or bedside chair)?: Total Help needed to walk in hospital  room?: Total Help needed climbing 3-5 steps with a railing? : Total 6 Click Score: 8    End of Session Equipment Utilized During Treatment: Gait belt Activity Tolerance: Other (comment);Patient limited by fatigue (treatment limited secondary to impaired cognition) Patient left: in bed;with call bell/phone within reach;with bed alarm set Nurse Communication: Mobility status;Need for lift equipment PT Visit Diagnosis: Other abnormalities of gait and mobility (R26.89);Muscle weakness (generalized) (M62.81);Other symptoms and signs involving the nervous system (R29.898);Hemiplegia and hemiparesis     Time: 1218-1246 PT Time Calculation (min) (ACUTE ONLY): 28 min  Charges:    $Therapeutic Exercise: 8-22 mins $Therapeutic Activity: 8-22 mins PT General Charges $$ ACUTE PT VISIT: 1 Visit                     Luster Hechler M,PT Acute Rehab Services [phone]    Stephane JULIANNA Bevel 03/17/2024, 4:30 PM

## 2024-03-17 NOTE — Plan of Care (Signed)
" °  Problem: Education: Goal: Knowledge of disease or condition will improve 03/17/2024 0335 by Lyle Maryann [name], RN Outcome: Progressing 03/17/2024 0335 by Lyle Maryann [name], RN Outcome: Progressing Goal: Knowledge of secondary prevention will improve (MUST DOCUMENT ALL) 03/17/2024 0335 by Lyle Maryann [name], RN Outcome: Progressing 03/17/2024 0335 by Lyle Maryann [name], RN Outcome: Progressing Goal: Knowledge of patient specific risk factors will improve (DELETE if not current risk factor) 03/17/2024 0335 by Lyle Maryann [name], RN Outcome: Progressing 03/17/2024 0335 by Lyle Maryann [name], RN Outcome: Progressing   Problem: Ischemic Stroke/TIA Tissue Perfusion: Goal: Complications of ischemic stroke/TIA will be minimized 03/17/2024 0335 by Lyle Maryann [name], RN Outcome: Progressing 03/17/2024 0335 by Lyle Maryann [name], RN Outcome: Progressing   Problem: Coping: Goal: Will verbalize positive feelings about self 03/17/2024 0335 by Lyle Maryann [name], RN Outcome: Progressing 03/17/2024 0335 by Lyle Maryann [name], RN Outcome: Progressing Goal: Will identify appropriate support needs 03/17/2024 0335 by Lyle Maryann [name], RN Outcome: Progressing 03/17/2024 0335 by Lyle Maryann [name], RN Outcome: Progressing   Problem: Health Behavior/Discharge Planning: Goal: Ability to manage health-related needs will improve 03/17/2024 0335 by Lyle Maryann [name], RN Outcome: Progressing 03/17/2024 0335 by Lyle Maryann [name], RN Outcome: Progressing Goal: Goals will be collaboratively established with patient/family 03/17/2024 0335 by Lyle Maryann [name], RN Outcome: Progressing 03/17/2024 0335 by Lyle Maryann [name], RN Outcome: Progressing   Problem: Self-Care: Goal: Ability to participate in self-care as condition permits will improve 03/17/2024 0335 by Lyle Maryann [name], RN Outcome: Progressing 03/17/2024 0335 by Lyle Maryann [name], RN Outcome: Progressing Goal: Verbalization  of feelings and concerns over difficulty with self-care will improve 03/17/2024 0335 by Lyle Maryann [name], RN Outcome: Progressing 03/17/2024 0335 by Lyle Maryann [name], RN Outcome: Progressing Goal: Ability to communicate needs accurately will improve 03/17/2024 0335 by Lyle Maryann [name], RN Outcome: Progressing 03/17/2024 0335 by Lyle Maryann [name], RN Outcome: Progressing   Problem: Nutrition: Goal: Risk of aspiration will decrease 03/17/2024 0335 by Lyle Maryann [name], RN Outcome: Progressing 03/17/2024 0335 by Lyle Maryann [name], RN Outcome: Progressing Goal: Dietary intake will improve 03/17/2024 0335 by Lyle Maryann [name], RN Outcome: Progressing 03/17/2024 0335 by Lyle Maryann [name], RN Outcome: Progressing   "

## 2024-03-17 NOTE — Progress Notes (Signed)
 " PROGRESS NOTE    Erin Riley  FMW:[id number] DOB: [birth year] DOA: 03/01/2024 PCP: [name], MD   Brief Narrative:  Erin Riley is a 74 yo female with PMH CAD, CKD, HLD, HTN, PAD, schizophrenia, severe AS who was found on the floor at home.  She had dense left-sided weakness, slurred speech, facial droop.  She required intubation upon arrival to the ER.  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  MRI brain showed acute right MCA territory infarct with 2 mm leftward midline shift.  She was able to be weaned off pressors and extubated.  Also evaluated by vascular surgery.  Subsequently she was not able to take any oral intake and Cortrak was placed.  Given her inability to advance her diet initially PEG tube was recommended with tentative plan of placement on 03/17/2024 per the IR team. MBS done and now on D1 Honey Thick Liquid Diet and did extremely well with her calorie count and oral intake so PEG has been discontinued and Cortrak is being removed.   PT/OT recommending CIR but may not be a candidate and may need to pursue SNF and other venues.  Assessment and Plan:   Subacute R MCA CVA  Dysphagia as a result of CVA -Presented outside of window for intervention. Etiology felt most likely cardioembolic  - Appreciate stroke team's management.  Agree that stroke was likely cardioembolic, although no LV thrombus seen on echo -SBP goal is complex:  enlarging aortic aneurysms, hx AS -- need to avoid significant HTN with rupture risk. Goal SBP 100-140  -Will discontinue the core track and tube feed for now and have SLP to continue p.o. -Unable to perform MBS on 1/5 due to her agitation and unable to follow prompts; but in the interim is improved and tolerating  her diet very well with max assist -Potential CIR candidate; has significant dense left hemiplegia with neglect; if declined, will need SNF pursuit after PEG placement; per to the inpatient rehabilitation coordinator she has low tolerance for and slow progression with therapy and they are likely cannot recommend other venues but we will continue to check on her in house -C/w Atorvastatin  80 mg per Tube Daily for now but may need to hold given Abnormal LFTs - Asa on Hold 1/6 - 1/12 in anticipation of PEG on 1/12, but given that the PEG is being canceled we will start back on aspirin  -Eliquis  has been held for the last 48 hours as well for possible PEG today however given that she has done quite well in the past her calorie count will resume her anticoagulation with apixaban  -IR was requesting repeat SLP eval prior to PEG.  This was done and Diet has been advanced to D1 w/ Honey Thick Liquids w/ FULL Supervision; she underwent calorie count and did very well so we will cancel and hold off PEG insertion at this time.  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  -not a candidate for open or endovascular repair at this time. See VVS note 12/28 for details   Hx NICM  HFrEF -- LVEF 25-30%  Severe AS -Not a TAVR candidate  -GDMT limited at this time w renal dysfxn; Was Holding w/ ASA 81 mg per NG Tube Daily but will now renew po; Atorvastatin  80 mg per Tube Daily and Carvedilol  12.5 mg per Tube BID now being changed to po Meds   AKI on CKD3b, improving  Elevated  CK, improving  -D/C'd Foley;  Renal function improved and back to baseline. BUN/Cr Trend: Recent Labs  Lab 03/06/24 0450 03/12/24 0502 03/13/24 0617 03/14/24 0409 03/15/24 0312 03/16/24 0921 03/17/24 0511  BUN 33* 35* 30* 36* 35* 34* 32*  CREATININE 1.41* 1.00 1.02* 1.06* 1.05* 1.05* 1.00  -IVF now stopped and getting TF as below -Avoid Nephrotoxic Medications, Contrast Dyes, Hypotension and Dehydration to Ensure Adequate Renal Perfusion and will need to Renally Adjust Meds. CTM & Trend Renal Function carefully & repeat CMP in the AM   Abnormal LFTs: Resolved. AST and ALT Trend mildly elevated and now normalized. AST went from 34 -> 71 -> 60 -> 53 -> 43 -> 39. ALT went from 16 -> 81 -> 69 -> 56 -> 46 -> 38. Likely reactive. CTM and Trend and may need to hold Atorvastatin  per Tube given Elevation but will continue for now.   Hypernatremia -> Hyponatremia: Improved. Na+ is now 139. CTM and Trend and repeat CMP in the AM    DM2 w Hyperglycemia: C/w Moderate Novolog  SSI q4h. CTM CBGs per Protocol. CBG Trend ranging from 97-128  Leukocytosis + low grade temp: Resolved on last check is WBC is 9.4. Some upper lobe rhonchi, plausible aspirations  -MRSA PCR neg. Had a foley but removed. Augmentin  course completed. WBC Trend:  Recent Labs  Lab 03/06/24 0450 03/11/24 0322 03/13/24 0617 03/14/24 0409 03/15/24 0312 03/16/24 0921 03/17/24 0511  WBC 11.7* 10.2 9.2 9.4 9.4 8.6 8.2  -CTM for S/Sx of Infection. Repeat CBC in the AM  Normocytic Anemia: Hgb/Hct Trend fairly stable but slightly lower:  Recent Labs  Lab 03/06/24 0450 03/11/24 0322 03/13/24 0617 03/14/24 0409 03/15/24 0312 03/16/24 0921 03/17/24 0511  HGB 10.5* 10.7* 10.1* 10.2* 9.7* 9.8* 10.0*  HCT 34.8* 34.8* 32.1* 32.6* 31.0* 31.7* 32.1*  MCV 94.1 91.1 89.4 90.6 90.4 90.6 90.7  -Checked Anemia Panel and showed an iron  level of 27, UIBC of 230, TIBC 58, saturation of 10%, ferritin of 344, folate level 8.7 and vitamin B12  1503.  CTM for S/Sx of Bleeding; No overt bleeding noted. Repeat CBC in the AM   Thrombocytosis: Mild and in the setting of above. Plt Count Trend is now 560. CTM and Trend and repeat CBC In the AM  Non-Severe (Moderate) Malnutrition in the context of chronic illness: Nutrition Status: Nutrition Problem: Moderate Malnutrition Etiology: chronic illness Signs/Symptoms: mild fat depletion, mild muscle depletion Interventions: Refer to RD note for recommendations - Currently getting tube feedings via core track with Osmolite 1.5 at 50 mL/h for 12 hours from 1800 -> 0600 and continuing free water  flushes 200 mL every 4 scheduled.  She is also getting Juven twice daily to support wound healing and calorie count has been placed to assess the need for PEG tube placement and currently has not really eaten anything today  Hypoalbuminemia: Patient's Albumin  Level is now 3.0 on the last check. CTM & Trend & repeat CMP in the AM  Class  I Obesity: Complicates overall prognosis and care. Estimated body mass index is 32.6 kg/m as calculated from the following:   Height as of this encounter: 5' 7 (1.702 m).   Weight as of this encounter: 94.4 kg. Weight Loss and Dietary Counseling given   DVT prophylaxis: SCDs Start: 03/01/24 1745 apixaban  (ELIQUIS ) tablet 5 mg    Code Status: Full Code Family Communication: No family present @ bedside   Disposition Plan:  Level of care: Med-Surg Status is: Inpatient Remains inpatient appropriate because: Needs SNF;    Consultants:  Neurology Interventional Radiology Vascular Surgery  Procedures:  As delineated as above   Antimicrobials:  Anti-infectives (From admission, onward)    Start     Dose/Rate Route Frequency Ordered Stop   03/17/24 0800  ceFAZolin  (ANCEF ) IVPB 2g/100 mL premix  Status:  Discontinued        2 g 200 mL/hr over 30 Minutes Intravenous To Radiology 03/11/24 1611 03/17/24 1509   03/05/24 1130  amoxicillin -clavulanate (AUGMENTIN ) 875-125  MG per tablet 1 tablet        1 tablet Per Tube Every 12 hours 03/05/24 1036 03/07/24 2101   03/03/24 0930  cefTRIAXone  (ROCEPHIN ) 2 g in sodium chloride  0.9 % 100 mL IVPB  Status:  Discontinued        2 g 200 mL/hr over 30 Minutes Intravenous Every 24 hours 03/03/24 0838 03/05/24 1036       Subjective: Seen and examined at bedside and she is resting and asleep and nursing states that she ate very well.  No nausea or vomiting.  No acute events overnight  Objective: Vitals:   03/17/24 0422 03/17/24 0500 03/17/24 0920 03/17/24 1459  BP: (!) 142/77  (!) 148/78 108/71  Pulse: 78  71 64  Resp: 18  18 16   Temp: 98.5 F (36.9 C)  98.3 F (36.8 C) 98.1 F (36.7 C)  TempSrc: Oral  Axillary Oral  SpO2: 99%  99% 97%  Weight:  94.4 kg    Height:        Intake/Output Summary (Last 24 hours) at 03/17/2024 1632 Last data filed at 03/17/2024 1225 Gross per 24 hour  Intake 840 ml  Output 1400 ml  Net -560 ml   Filed Weights   03/15/24 0500 03/16/24 0459 03/17/24 0500  Weight: 95.1 kg 96.2 kg 94.4 kg   Examination: Physical Exam:  Constitutional: WN/WD obese African-American female who is asleep and somnolent and drowsy Respiratory: Diminished to auscultation bilaterally, no wheezing, rales, rhonchi or crackles. Normal respiratory effort and patient is not tachypenic. No accessory muscle use.  Unlabored breathing Cardiovascular: RRR, no murmurs / rubs / gallops. S1 and S2 auscultated. No extremity edema. Abdomen: Soft, non-tender, slightly distended secondary body habitus bowel sounds positive.  GU: Deferred. Musculoskeletal: No clubbing / cyanosis of digits/nails. No joint deformity upper and lower extremities. Skin: No rashes, lesions, ulcers, to skin evaluation. No induration; Warm and dry.  Neurologic: Somnolent and drowsy and did not wake during the encounter; not awake enough to participate in or examination Psychiatric: Appears calm  Data Reviewed: I have personally reviewed  following labs and imaging studies  CBC: Recent Labs  Lab 03/13/24 0617 03/14/24 0409 03/15/24 0312 03/16/24 0921 03/17/24 0511  WBC 9.2 9.4 9.4 8.6 8.2  NEUTROABS 6.7 7.0 6.9 6.5 6.0  HGB 10.1* 10.2* 9.7* 9.8* 10.0*  HCT 32.1* 32.6* 31.0* 31.7* 32.1*  MCV 89.4 90.6 90.4 90.6 90.7  PLT 531* 559* 547* 514* 560*   Basic Metabolic Panel:  Recent Labs  Lab 03/13/24 0617 03/14/24 0409 03/15/24 0312 03/16/24 0921 03/17/24 0511  NA 135 136 136 138 139  K 4.7 4.7 4.7 4.5 4.6  CL 102 102 103 104 105  CO2 26 24 24 27 26   GLUCOSE 129* 102* 119* 126* 97  BUN 30* 36* 35* 34* 32*  CREATININE 1.02* 1.06* 1.05* 1.05* 1.00  CALCIUM  9.9 9.7 9.6 10.0 9.9  MG 2.3 2.2 2.2 2.1 2.1  PHOS 3.4 3.5 3.2 2.9 3.0   GFR: Estimated Creatinine Clearance: 60 mL/min (by C-G formula based on SCr of 1 mg/dL). Liver Function Tests: Recent Labs  Lab 03/13/24 0617 03/14/24 0409 03/15/24 0312 03/16/24 0921 03/17/24 0511  AST 71* 60* 53* 43* 39  ALT 81* 69* 56* 46* 38  ALKPHOS 135* 140* 126 124 123  BILITOT 0.5 0.5 0.4 0.4 0.5  PROT 7.4 7.6 7.2 7.3 7.4  ALBUMIN  3.1* 3.1* 2.9* 3.0* 3.0*   No results for input(s): LIPASE, AMYLASE in the last 168 hours. No results for input(s): AMMONIA in the last 168 hours. Coagulation Profile: Recent Labs  Lab 03/17/24 0511  INR 1.3*   Cardiac Enzymes: No results for input(s): CKTOTAL, CKMB, CKMBINDEX, TROPONINI in the last 168 hours. BNP (last 3 results) No results for input(s): PROBNP in the last 8760 hours. HbA1C: No results for input(s): HGBA1C in the last 72 hours. CBG: Recent Labs  Lab 03/16/24 2138 03/17/24 0016 03/17/24 0534 03/17/24 0805 03/17/24 1156  GLUCAP 142* 128* 97 99 108*   Lipid Profile: No results for input(s): CHOL, HDL, LDLCALC, TRIG, CHOLHDL, LDLDIRECT in the last 72 hours. Thyroid  Function Tests: No results for input(s): TSH, T4TOTAL, FREET4, T3FREE, THYROIDAB in the last 72  hours. Anemia Panel: No results for input(s): VITAMINB12, FOLATE, FERRITIN, TIBC, IRON , RETICCTPCT in the last 72 hours. Sepsis Labs: No results for input(s): PROCALCITON, LATICACIDVEN in the last 168 hours.  No results found for this or any previous visit (from the past 240 hours).   Radiology Studies: No results found.  Scheduled Meds:  [START ON 03/18/2024] amLODipine   10 mg Oral Daily   apixaban   5 mg Oral BID   aspirin   81 mg Oral Daily   [START ON 03/18/2024] atorvastatin   80 mg Oral Daily   carvedilol   12.5 mg Oral BID WC   docusate sodium   100 mg Oral BID   feeding supplement (OSMOLITE 1.5 CAL)  1,000 mL Per Tube Q24H   free water   200 mL Per Tube Q4H   insulin  aspart  0-15 Units Subcutaneous Q4H   nicotine   14 mg Transdermal Daily   nutrition supplement (JUVEN)  1 packet Per Tube BID BM   OLANZapine   5 mg Oral QHS   mouth rinse  15 mL Mouth Rinse 4 times per day   [START ON 03/18/2024] polyethylene glycol  17 g Oral Daily   Continuous Infusions:   LOS: 16 days   Alejandro Marker, DO [facility] Available via Epic secure chat 7am-7pm After these hours, please refer to coverage provider listed on [email or web address] 03/17/2024, 4:32 PM  "

## 2024-03-18 DIAGNOSIS — R471 Dysarthria and anarthria: Secondary | ICD-10-CM | POA: Diagnosis not present

## 2024-03-18 DIAGNOSIS — R1310 Dysphagia, unspecified: Secondary | ICD-10-CM | POA: Diagnosis not present

## 2024-03-18 DIAGNOSIS — I639 Cerebral infarction, unspecified: Secondary | ICD-10-CM | POA: Diagnosis not present

## 2024-03-18 DIAGNOSIS — I10 Essential (primary) hypertension: Secondary | ICD-10-CM | POA: Diagnosis not present

## 2024-03-18 LAB — CBC WITH DIFFERENTIAL/PLATELET
Abs Immature Granulocytes: 0.03 K/uL (ref 0.00–0.07)
Basophils Absolute: 0 K/uL (ref 0.0–0.1)
Basophils Relative: 0 %
Eosinophils Absolute: 0.2 K/uL (ref 0.0–0.5)
Eosinophils Relative: 3 %
HCT: 32.3 % — ABNORMAL LOW (ref 36.0–46.0)
Hemoglobin: 10.2 g/dL — ABNORMAL LOW (ref 12.0–15.0)
Immature Granulocytes: 0 %
Lymphocytes Relative: 18 %
Lymphs Abs: 1.3 K/uL (ref 0.7–4.0)
MCH: 28.4 pg (ref 26.0–34.0)
MCHC: 31.6 g/dL (ref 30.0–36.0)
MCV: 90 fL (ref 80.0–100.0)
Monocytes Absolute: 0.6 K/uL (ref 0.1–1.0)
Monocytes Relative: 7 %
Neutro Abs: 5.4 K/uL (ref 1.7–7.7)
Neutrophils Relative %: 72 %
Platelets: 581 K/uL — ABNORMAL HIGH (ref 150–400)
RBC: 3.59 MIL/uL — ABNORMAL LOW (ref 3.87–5.11)
RDW: 14.7 % (ref 11.5–15.5)
WBC: 7.5 K/uL (ref 4.0–10.5)
nRBC: 0 % (ref 0.0–0.2)

## 2024-03-18 LAB — COMPREHENSIVE METABOLIC PANEL WITH GFR
ALT: 35 U/L (ref 0–44)
AST: 39 U/L (ref 15–41)
Albumin: 3.2 g/dL — ABNORMAL LOW (ref 3.5–5.0)
Alkaline Phosphatase: 125 U/L (ref 38–126)
Anion gap: 8 (ref 5–15)
BUN: 35 mg/dL — ABNORMAL HIGH (ref 8–23)
CO2: 26 mmol/L (ref 22–32)
Calcium: 10.4 mg/dL — ABNORMAL HIGH (ref 8.9–10.3)
Chloride: 105 mmol/L (ref 98–111)
Creatinine, Ser: 1.04 mg/dL — ABNORMAL HIGH (ref 0.44–1.00)
GFR, Estimated: 57 mL/min — ABNORMAL LOW
Glucose, Bld: 97 mg/dL (ref 70–99)
Potassium: 4.2 mmol/L (ref 3.5–5.1)
Sodium: 140 mmol/L (ref 135–145)
Total Bilirubin: 0.7 mg/dL (ref 0.0–1.2)
Total Protein: 7.7 g/dL (ref 6.5–8.1)

## 2024-03-18 LAB — GLUCOSE, CAPILLARY
Glucose-Capillary: 96 mg/dL (ref 70–99)
Glucose-Capillary: 134 mg/dL — ABNORMAL HIGH (ref 70–99)
Glucose-Capillary: 101 mg/dL — ABNORMAL HIGH (ref 70–99)
Glucose-Capillary: 126 mg/dL — ABNORMAL HIGH (ref 70–99)

## 2024-03-18 LAB — PHOSPHORUS: Phosphorus: 3.2 mg/dL (ref 2.5–4.6)

## 2024-03-18 LAB — MAGNESIUM: Magnesium: 2.3 mg/dL (ref 1.7–2.4)

## 2024-03-18 MED ORDER — JUVEN PO PACK
1.0000 | PACK | Freq: Two times a day (BID) | ORAL | Status: DC
  Administered 2024-03-19 – 2024-03-21 (×5): 1 via ORAL
  Filled 2024-03-18 (×5): qty 1

## 2024-03-18 MED ORDER — SODIUM CHLORIDE 0.9 % IV SOLN: INTRAVENOUS | Status: AC

## 2024-03-18 NOTE — Plan of Care (Signed)

## 2024-03-18 NOTE — Progress Notes (Signed)
 " PROGRESS NOTE    Erin Riley  FMW:[id number] DOB: 02-Jan-1951 DOA: 03/01/2024 PCP: [name], MD   Brief Narrative:  [name] is a 74 yo female with PMH CAD, CKD, HLD, HTN, PAD, schizophrenia, severe AS who was found on the floor at home.  She had dense left-sided weakness, slurred speech, facial droop.  She required intubation upon arrival to the ER.  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  MRI brain showed acute right MCA territory infarct with 2 mm leftward midline shift.  She was able to be weaned off pressors and extubated.  Also evaluated by vascular surgery.  Subsequently she was not able to take any oral intake and Cortrak was placed.  Given her inability to advance her diet initially PEG tube was recommended with tentative plan of placement on 03/17/2024 per the IR team. MBS done and now on D1 Honey Thick Liquid Diet and did extremely well with her calorie count and oral intake so PEG has been discontinued and Cortrak is being removed.   PT/OT recommending CIR but may not be a candidate and may need to pursue SNF and other venues.  Assessment and Plan:   Subacute R MCA CVA  Dysphagia as a result of CVA, improved  -Presented outside of window for intervention. Etiology felt most likely cardioembolic  -Appreciate stroke team's management.  Agree that stroke was likely cardioembolic, although no LV thrombus seen on echo -SBP goal is complex:  enlarging aortic aneurysms, hx AS -- need to avoid significant HTN with rupture risk. Goal SBP 100-140  -Has significant dense left hemiplegia with neglect; CIR declined and now PT/OT recommending SNF, will need SNF pursuit and TOC involved and Faxed out SNF Referrals -Had poor intake and Cortrak  Placement with TF with plans to transition to PEG given mentation and status; IR was requesting repeat SLP eval prior to PEG.  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  -Not a candidate for open or endovascular repair at this time. See VVS note 12/28 for details   Hx NICM  HFrEF -- LVEF 25-30%  Severe AS -Not a TAVR candidate  -GDMT limited at this time w renal dysfxn; C/w ASA 81 mg po Daily, Atorvastatin  80 mg Daily, and Carvedilol  12.5 mg BID    AKI on CKD3b, improving  Elevated CK, improving  -D/C'd Foley;  Renal function improved and back to baseline. BUN/Cr Trend fairly stable and is now 35/1.04 on the last check. No longer getting IVF and TF stopped. Avoid Nephrotoxic Medications, Contrast Dyes, Hypotension and Dehydration to Ensure Adequate Renal Perfusion and will need to Renally Adjust Meds. CTM & Trend Renal Function carefully & repeat CMP in the  AM   Abnormal LFTs: Resolved. AST and ALT Trend mildly elevated and now normalized. AST went from 34 -> 71 -> 60 -> 53 -> 43 -> 39 x2. ALT went from 16 -> 81 -> 69 -> 56 -> 46 -> 38 -> 35. Likely reactive. CTM and Trend intermittently   Hypernatremia: Improved. Na+ is now 140. CTM and Trend and repeat CMP in the AM    DM2 w Hyperglycemia: C/w Moderate Novolog  SSI q4h. CTM CBGs per  Protocol. CBG Trend ranging from 91-134 on the last 7 checks  Leukocytosis + Low grade temp: Resolved. WBC is 7.5. Some upper lobe rhonchi, plausible aspirations. MRSA PCR neg. Had a foley but removed. Augmentin  course completed. CTM for S/Sx of Infection. Repeat CBC in the AM  Normocytic Anemia: Hgb/Hct Trend fairly stable with last check being 10.2/32.3. Checked Anemia Panel- Fe level of 27, UIBC of 230, TIBC 58, saturation of 10%, ferritin of 344, folate level 8.7 and vitamin B12 1503.  CTM for S/Sx of Bleeding; No overt bleeding noted. Repeat CBC in the AM   Thrombocytosis: Mild and in the setting of above. Plt Count has trended up to 581. CTM and Trend and repeat CBC In the AM  Non-Severe (Moderate) Malnutrition in the context of chronic illness: Nutrition Status: Nutrition Problem: Moderate Malnutrition Etiology: chronic illness Signs/Symptoms: mild fat depletion, mild muscle depletion Interventions: Refer to RD note for recommendations -TF via Cortrak Discontinued given   Hypoalbuminemia: Albumin  Level is now 3.2 on the last check. CTM & Trend & repeat CMP in the AM  Class I Obesity: Complicates overall prognosis and care. Estimated body mass index is 33.18 kg/m as calculated from the following:   Height as of this encounter: 5' 7 (1.702 m).   Weight as of this encounter: 96.1 kg. Weight Loss and Dietary Counseling given   DVT prophylaxis: SCDs Start: 03/01/24 1745 apixaban  (ELIQUIS ) tablet 5 mg    Code Status: Full Code Family Communication: No family present @ bedside   Disposition Plan:  Level of care: Med-Surg Status is: Inpatient Remains inpatient appropriate because: Needs a bed at SNF given that CIR has declined  Consultants:  Neurology Interventional Radiology Vascular Surgery  Procedures:  As delineated as above   Antimicrobials:  Anti-infectives (From admission, onward)    Start     Dose/Rate Route Frequency Ordered Stop   03/17/24 0800  ceFAZolin  (ANCEF )  IVPB 2g/100 mL premix  Status:  Discontinued        2 g 200 mL/hr over 30 Minutes Intravenous To Radiology 03/11/24 1611 03/17/24 1509   03/05/24 1130  amoxicillin -clavulanate (AUGMENTIN ) 875-125 MG per tablet 1 tablet        1 tablet Per Tube Every 12 hours 03/05/24 1036 03/07/24 2101   03/03/24 0930  cefTRIAXone  (ROCEPHIN ) 2 g in sodium chloride  0.9 % 100 mL IVPB  Status:  Discontinued        2 g 200 mL/hr over 30 Minutes Intravenous Every 24 hours 03/03/24 0838 03/05/24 1036       Subjective: Seen and examined at bedside and she is resting but wanting something to drink.  No nausea or vomiting.  Nurse states that she is eating fairly well.  Little bit more awake and alert and attentive.  Objective: Vitals:   03/18/24 0500 03/18/24 0517 03/18/24 0805 03/18/24 1454  BP:  (!) 154/93 (!) 141/88 (!) 147/79  Pulse:  78 76 66  Resp:  18    Temp:  98 F (36.7  C) 98.4 F (36.9 C) 98 F (36.7 C)  TempSrc:   Oral   SpO2:  95% 99% 97%  Weight: 96.1 kg     Height:        Intake/Output Summary (Last 24 hours) at 03/18/2024 1730 Last data filed at 03/18/2024 0518 Gross per 24 hour  Intake 220 ml  Output 500 ml  Net -280 ml   Filed Weights   03/16/24 0459 03/17/24 0500 03/18/24 0500  Weight: 96.2 kg 94.4 kg 96.1 kg   Examination: Physical Exam:  Constitutional: WN/WD obese African-American female who is calm and in no acute distress Respiratory: Diminished to auscultation bilaterally, no wheezing, rales, rhonchi or crackles. Normal respiratory effort and patient is not tachypenic. No accessory muscle use.  Unlabored breathing Cardiovascular: RRR, no murmurs / rubs / gallops. S1 and S2 auscultated. No extremity edema.  Abdomen: Soft, non-tender, distended secondary to body habitus. Bowel sounds positive.  GU: Deferred. Musculoskeletal: No clubbing / cyanosis of digits/nails. No joint deformity upper and lower extremities.  Skin: Left-sided hemiplegia noted Neurologic: Has  dysarthria and left slight facial droop.  Able to follow commands in the right and is weak on the left.. Romberg sign cerebellar reflexes not assessed.  Psychiatric: Awake and alert and appears calm  Data Reviewed: I have personally reviewed following labs and imaging studies  CBC: Recent Labs  Lab 03/14/24 0409 03/15/24 0312 03/16/24 0921 03/17/24 0511 03/18/24 0538  WBC 9.4 9.4 8.6 8.2 7.5  NEUTROABS 7.0 6.9 6.5 6.0 5.4  HGB 10.2* 9.7* 9.8* 10.0* 10.2*  HCT 32.6* 31.0* 31.7* 32.1* 32.3*  MCV 90.6 90.4 90.6 90.7 90.0  PLT 559* 547* 514* 560* 581*   Basic Metabolic Panel: Recent Labs  Lab 03/14/24 0409 03/15/24 0312 03/16/24 0921 03/17/24 0511 03/18/24 0538  NA 136 136 138 139 140  K 4.7 4.7 4.5 4.6 4.2  CL 102 103 104 105 105  CO2 24 24 27 26 26   GLUCOSE 102* 119* 126* 97 97  BUN 36* 35* 34* 32* 35*  CREATININE 1.06* 1.05* 1.05* 1.00 1.04*  CALCIUM  9.7 9.6 10.0 9.9 10.4*  MG 2.2 2.2 2.1 2.1 2.3  PHOS 3.5 3.2 2.9 3.0 3.2   GFR: Estimated Creatinine Clearance: 58.2 mL/min (A) (by C-G formula based on SCr of 1.04 mg/dL (H)). Liver Function Tests: Recent Labs  Lab 03/14/24 0409 03/15/24 0312 03/16/24 0921 03/17/24 0511 03/18/24 0538  AST 60* 53* 43* 39 39  ALT 69* 56* 46* 38 35  ALKPHOS 140* 126 124 123 125  BILITOT 0.5 0.4 0.4 0.5 0.7  PROT 7.6 7.2 7.3 7.4 7.7  ALBUMIN  3.1* 2.9* 3.0* 3.0* 3.2*   No results for input(s): LIPASE, AMYLASE in the last 168 hours. No results for input(s): AMMONIA in the last 168 hours. Coagulation Profile: Recent Labs  Lab 03/17/24 0511  INR 1.3*   Cardiac Enzymes: No results for input(s): CKTOTAL, CKMB, CKMBINDEX, TROPONINI in the last 168 hours. BNP (last 3 results) No results for input(s): PROBNP in the last 8760 hours. HbA1C: No results for input(s): HGBA1C in the last 72 hours. CBG: Recent Labs  Lab 03/17/24 1156 03/17/24 1702 03/17/24 2329 03/18/24 0805 03/18/24 1126  GLUCAP 108* 91 103* 96  134*   Lipid Profile: No results for input(s): CHOL, HDL, LDLCALC, TRIG, CHOLHDL, LDLDIRECT in the last 72 hours. Thyroid  Function Tests: No results for input(s): TSH, T4TOTAL, FREET4, T3FREE, THYROIDAB in the last 72 hours. Anemia Panel: No results for input(s): VITAMINB12, FOLATE, FERRITIN, TIBC,  IRON , RETICCTPCT in the last 72 hours. Sepsis Labs: No results for input(s): PROCALCITON, LATICACIDVEN in the last 168 hours.  No results found for this or any previous visit (from the past 240 hours).   Radiology Studies: No results found.  Scheduled Meds:  amLODipine   10 mg Oral Daily   apixaban   5 mg Oral BID   aspirin   81 mg Oral Daily   atorvastatin   80 mg Oral Daily   carvedilol   12.5 mg Oral BID WC   docusate sodium   100 mg Oral BID   insulin  aspart  0-5 Units Subcutaneous QHS   insulin  aspart  0-9 Units Subcutaneous TID WC   nicotine   14 mg Transdermal Daily   [START ON 03/19/2024] nutrition supplement (JUVEN)  1 packet Oral BID BM   OLANZapine   5 mg Oral QHS   mouth rinse  15 mL Mouth Rinse 4 times per day   polyethylene glycol  17 g Oral Daily   Continuous Infusions:  sodium chloride       LOS: 17 days   Alejandro Marker, DO [facility] Available via Epic secure chat 7am-7pm After these hours, please refer to coverage provider listed on [email or web address] 03/18/2024, 5:30 PM  "

## 2024-03-18 NOTE — TOC Progression Note (Signed)
 Transition of Care [facility]) - Progression Note    Patient Details  Name: Erin Riley MRN: [id number] Date of Birth: 11-03-1950  [facility] CM/SW Contact  Gerhard Rappaport LITTIE Moose, [place] Phone Number: 03/18/2024, 9:44 AM  Clinical Narrative:    Per MD note 1/12, pt no longer needs PEG and cortrak has been removed. CSW added PASRR to pt Fl2 and sent out SNF referrals. CSW will follow up to provide bed offers.    Expected Discharge Plan: IP Rehab Facility Barriers to Discharge: Continued Medical Work up               Expected Discharge Plan and Services   Discharge Planning Services: CM Consult Post Acute Care Choice: IP Rehab Living arrangements for the past 2 months: Single Family Home                                       Social Drivers of Health (SDOH) Interventions SDOH Screenings   Food Insecurity: No Food Insecurity (03/02/2024)  Housing: Low Risk (03/02/2024)  Transportation Needs: No Transportation Needs (03/02/2024)  Utilities: Not At Risk (03/02/2024)  Alcohol Screen: Low Risk (12/20/2023)  Social Connections: Unknown (03/02/2024)  Tobacco Use: High Risk (03/01/2024)    Readmission Risk Interventions    06/14/2023   11:56 AM 09/14/2022   10:20 AM  Readmission Risk Prevention Plan  Transportation Screening Complete Complete  PCP or Specialist Appt within 3-5 Days Complete Complete  HRI or Home Care Consult Complete Complete  Social Work Consult for Recovery Care Planning/Counseling  Complete  Palliative Care Screening Not Applicable Not Applicable  Medication Review Oceanographer) Complete Referral to Pharmacy

## 2024-03-18 NOTE — Progress Notes (Signed)
 Speech Language Pathology Treatment: Dysphagia  Patient Details Name: Erin Riley MRN: [id number] DOB: 09/17/1950 Today's Date: 03/18/2024 Time: 8584-8575 SLP Time Calculation (min) (ACUTE ONLY): 9 min  Assessment / Plan / Recommendation Clinical Impression  Plan: continue with Dys 1 (Puree) solids, honey thick liquids. SLP will continue to follow.  Patient was lethargic and required frequent verbal and tactile cues for adequate alertness. Lunch tray in room with approximately less than 25% consumed. She required SLP to assist to keep her head at neutral position for cup sips of honey thick liquids, however she tolerated best with spoon sips honey thick liquids as there was less anterior spillage. No overt s/s observed.     HPI HPI: Erin Riley is a 74 y.o. female who presented to the hospital from home on 03/01/24 after being found by her son with left sided weakness, slurred speech and facial droop. She was intubated upon arrival to ER. CXR did not show acute abnormality, CT chest did not show any focal consolidation or PE. 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 She was extubated on 03/02/24 at 1305 to oxygen via [place]. PMH: HTN, severe aortic stenosis, nonischemic cardiomyopathy, CKD stage IIIb, schizophrenia. MBSS completed on 12/29 with recommendation to continue NPO with the exception of puree snacks. Attempted MBS repeat MBS 1/5 however she refused. Pt accepting po at bedside and repeat MBS today for possible initiation of po.      SLP Plan  Continue with current plan of care        Swallow Evaluation Recommendations         Recommendations  Diet recommendations: Honey-thick liquid;Dysphagia 1 (puree) Liquids provided via: Cup;Teaspoon Medication Administration: Crushed with puree Supervision: Full supervision/cueing for compensatory strategies;Staff to assist with self feeding Compensations: Slow rate;Small  sips/bites Postural Changes and/or Swallow Maneuvers: Seated upright 90 degrees;Upright 30-60 min after meal                  Oral care BID;Staff/trained caregiver to provide oral care   Frequent or constant Supervision/Assistance Dysphagia, oropharyngeal phase (R13.12)     Continue with current plan of care    Norleen IVAR Blase, MA, CCC-SLP Speech Therapy   03/18/2024, 3:38 PM

## 2024-03-18 NOTE — Plan of Care (Signed)
" °  Problem: Education: Goal: Knowledge of disease or condition will improve 03/18/2024 1756 by Raymond Tedi CROME, RN Outcome: Progressing 03/18/2024 1718 by Raymond Tedi CROME, RN Outcome: Progressing Goal: Knowledge of secondary prevention will improve (MUST DOCUMENT ALL) 03/18/2024 1756 by Raymond Tedi CROME, RN Outcome: Progressing 03/18/2024 1718 by Raymond Tedi CROME, RN Outcome: Progressing Goal: Knowledge of patient specific risk factors will improve (DELETE if not current risk factor) 03/18/2024 1756 by Raymond Tedi CROME, RN Outcome: Progressing 03/18/2024 1718 by Raymond Tedi CROME, RN Outcome: Progressing   Problem: Ischemic Stroke/TIA Tissue Perfusion: Goal: Complications of ischemic stroke/TIA will be minimized 03/18/2024 1756 by Raymond Tedi CROME, RN Outcome: Progressing 03/18/2024 1718 by Raymond Tedi CROME, RN Outcome: Progressing   Problem: Coping: Goal: Will verbalize positive feelings about self 03/18/2024 1756 by Raymond Tedi CROME, RN Outcome: Progressing 03/18/2024 1718 by Raymond Tedi CROME, RN Outcome: Progressing Goal: Will identify appropriate support needs 03/18/2024 1756 by Raymond Tedi CROME, RN Outcome: Progressing 03/18/2024 1718 by Raymond Tedi CROME, RN Outcome: Progressing   Problem: Health Behavior/Discharge Planning: Goal: Ability to manage health-related needs will improve 03/18/2024 1756 by Raymond Tedi CROME, RN Outcome: Progressing 03/18/2024 1718 by Raymond Tedi CROME, RN Outcome: Progressing Goal: Goals will be collaboratively established with patient/family 03/18/2024 1756 by Raymond Tedi CROME, RN Outcome: Progressing 03/18/2024 1718 by Raymond Tedi CROME, RN Outcome: Progressing   Problem: Self-Care: Goal: Ability to participate in self-care as condition permits will improve 03/18/2024 1756 by Raymond Tedi CROME, RN Outcome: Progressing 03/18/2024 1718 by Raymond Tedi CROME,  RN Outcome: Progressing Goal: Verbalization of feelings and concerns over difficulty with self-care will improve 03/18/2024 1756 by Raymond Tedi CROME, RN Outcome: Progressing 03/18/2024 1718 by Raymond Tedi CROME, RN Outcome: Progressing Goal: Ability to communicate needs accurately will improve 03/18/2024 1756 by Raymond Tedi CROME, RN Outcome: Progressing 03/18/2024 1718 by Raymond Tedi CROME, RN Outcome: Progressing   Problem: Nutrition: Goal: Risk of aspiration will decrease 03/18/2024 1756 by Raymond Tedi CROME, RN Outcome: Progressing 03/18/2024 1718 by Raymond Tedi CROME, RN Outcome: Progressing Goal: Dietary intake will improve 03/18/2024 1756 by Raymond Tedi CROME, RN Outcome: Progressing 03/18/2024 1718 by Raymond Tedi CROME, RN Outcome: Progressing   "

## 2024-03-18 NOTE — Progress Notes (Signed)
 Occupational Therapy Treatment Patient Details Name: Erin Riley MRN: [id number] DOB: 03/26/1950 Today's Date: 03/18/2024   History of present illness 74 y.o. female admitted 03/01/24 after being found down by son with left sided weakness, slurred speech, facial droop, apnea. MRI showed acute R MCA territory infarct with 2mm leftward midline shift. Chest CTA showed interval enlargement of descending thoracic aortic aneurysm. ETT 12/27-12/28. Cotrak placed due to dysphagia; plan for peg tube placement 1/12. PMH includes HTN, hypertrophic cardiomyopathy, PAD, CKD III, HLD, schizophrenia, AAA.   OT comments  Pt making slow progress with functional goals. Pt in bed upon arrival and agreeable to EOB sitting activities. Pt required min A to initiate B LE movement to EOB and trunk elevation, pt needing cues for sequencing and to attend to L side and L UE.SABRA Throughout required  mod multimodal cues due to cognition, L inattention, R gaze preference with cues to attend to L side with therapist to L side of pt during activity. Pt sat EOB x ~13-15 minutes with CGA/min A to maintain balance during grooming/hygiene, UB ADLs and L UE PROM. Updating d/c recommendations to post acute rehab < 3 hours day. OT will continue to follow acutely to maximize level of function and safety      If plan is discharge home, recommend the following:  Two people to help with walking and/or transfers;Assist for transportation;Supervision due to cognitive status;A lot of help with bathing/dressing/bathroom   Equipment Recommendations  Other (comment) (defer)    Recommendations for Other Services      Precautions / Restrictions Precautions Precautions: Fall;Other (comment) Recall of Precautions/Restrictions: Impaired Precaution/Restrictions Comments: L inattention, LUE hemiplegia Restrictions Weight Bearing Restrictions Per Provider Order: No       Mobility Bed Mobility Overal bed mobility: Needs Assistance Bed  Mobility: Rolling, Supine to Sit, Sit to Supine   Sidelying to sit: Min assist Supine to sit: Min assist, HOB elevated Sit to supine: Min assist, HOB elevated   General bed mobility comments: min A to initiate B LE movement to EOB and trunk elevation, pt needing cues to attend to L side and L UE    Transfers Overall transfer level: Needs assistance Equipment used: 2 person hand held assist, Rolling walker (2 wheels) Transfers: Sit to/from Stand Sit to Stand: Max assist           General transfer comment: max A STS x 3 trials at EOB, pt with Poor sequencing and balance, L side inattention with R gaze, unable to achieve upright and safe stance     Balance Overall balance assessment: Needs assistance Sitting-balance support: No upper extremity supported, Feet supported Sitting balance-Leahy Scale: Fair Sitting balance - Comments: verbal and physical cues for R hand placement. Pt sat EOB x ~15 minutes with CGA/min A to maintain balance during grooming/hygiene, UB ADLs and L UE PROM   Standing balance support: Bilateral upper extremity supported, During functional activity Standing balance-Leahy Scale: Zero                             ADL either performed or assessed with clinical judgement   ADL Overall ADL's : Needs assistance/impaired   Eating/Feeding Details (indicate cue type and reason): pt now off cortrak, did not address self feeding this session Grooming: Wash/dry hands;Wash/dry face;Minimal assistance;Sitting Grooming Details (indicate cue type and reason): with mod multimodal cues to initiate due to L side inattention and R gaze Upper Body Bathing: Moderate assistance;Sitting  Upper Body Dressing : Maximal assistance;Moderate assistance;Sitting           Toileting- Clothing Manipulation and Hygiene: Total assistance;Bed level         General ADL Comments: max multimodal cues due to cognition, L inattention, R gaze preference with cues to  attend to L side with therapist to L side of pt during activity. Pt sat EOB x ~15 minutes with CGA/min A to maintain balance during grooming/hygiene, UB ADLs and L UE PROM w moderate multimomdal cues for sequencing and to attend to L side    Extremity/Trunk Assessment Upper Extremity Assessment Upper Extremity Assessment: Generalized weakness LUE Deficits / Details: edematous, flaccid LUE Sensation: decreased light touch;decreased proprioception LUE Coordination: decreased fine motor;decreased gross motor   Lower Extremity Assessment Lower Extremity Assessment: Defer to PT evaluation        Vision Ability to See in Adequate Light: 1 Impaired Patient Visual Report: No change from baseline Alignment/Gaze Preference: Gaze right Additional Comments: L inattention   Perception     Praxis     Communication Communication Communication: Impaired Factors Affecting Communication: Difficulty expressing self;Reduced clarity of speech   Cognition Arousal: Alert Behavior During Therapy: Restless                                 Following commands: Impaired Following commands impaired: Follows one step commands inconsistently      Cueing   Cueing Techniques: Verbal cues, Gestural cues, Tactile cues, Visual cues  Exercises Other Exercises Other Exercises: Pt tolerated PROM of L UE sitting EOB    Shoulder Instructions       General Comments      Pertinent Vitals/ Pain       Pain Assessment Pain Assessment: No/denies pain Pain Score: 0-No pain Faces Pain Scale: No hurt Pain Intervention(s): Monitored during session, Repositioned  Home Living                                          Prior Functioning/Environment              Frequency  Min 2X/week        Progress Toward Goals  OT Goals(current goals can now be found in the care plan section)  Progress towards OT goals: Progressing toward goals     Plan      Co-evaluation                  AM-PAC OT 6 Clicks Daily Activity     Outcome Measure   Help from another person eating meals?: Total (did not address this session, but pt off Cortrak) Help from another person taking care of personal grooming?: A Little Help from another person toileting, which includes using toliet, bedpan, or urinal?: Total Help from another person bathing (including washing, rinsing, drying)?: A Lot Help from another person to put on and taking off regular upper body clothing?: A Lot Help from another person to put on and taking off regular lower body clothing?: Total 6 Click Score: 10    End of Session    OT Visit Diagnosis: Other abnormalities of gait and mobility (R26.89);Muscle weakness (generalized) (M62.81);Low vision, both eyes (H54.2);Other symptoms and signs involving the nervous system (R29.898);Other symptoms and signs involving cognitive function;Hemiplegia and hemiparesis Hemiplegia - Right/Left: Left Hemiplegia - dominant/non-dominant: Non-Dominant  Hemiplegia - caused by: Cerebral infarction   Activity Tolerance Patient tolerated treatment well   Patient Left in bed;with call bell/phone within reach;with bed alarm set   Nurse Communication Mobility status        Time: 8753-8686 OT Time Calculation (min): 27 min  Charges: OT General Charges $OT Visit: 1 Visit OT Treatments $Self Care/Home Management : 8-22 mins $Therapeutic Activity: 8-22 mins    Jacques Karna Loose 03/18/2024, 2:17 PM

## 2024-03-19 DIAGNOSIS — I639 Cerebral infarction, unspecified: Secondary | ICD-10-CM | POA: Diagnosis not present

## 2024-03-19 LAB — GLUCOSE, CAPILLARY
Glucose-Capillary: 89 mg/dL (ref 70–99)
Glucose-Capillary: 113 mg/dL — ABNORMAL HIGH (ref 70–99)
Glucose-Capillary: 127 mg/dL — ABNORMAL HIGH (ref 70–99)
Glucose-Capillary: 95 mg/dL (ref 70–99)

## 2024-03-19 NOTE — TOC Progression Note (Signed)
 Transition of Care [facility]) - Progression Note    Patient Details  Name: Erin Riley MRN: [id number] Date of Birth: 1951/01/24  [facility]) CM/SW Contact  [name], [place] Phone Number: 03/19/2024, 10:15 AM  Clinical Narrative:     CSW called pt's son [name] and discussed SNF bed offers. CSW left bed offer list with medicare star ratings bedside for son when he visits. Also texted list to son's phone number. He is currently at work; he will review list when able.   Expected Discharge Plan: Skilled Nursing Facility Barriers to Discharge: Continued Medical Work up, English As A Second Language Teacher               Expected Discharge Plan and Services   Discharge Planning Services: CM Consult Post Acute Care Choice: IP Rehab Living arrangements for the past 2 months: Single Family Home                                       Social Drivers of Health (SDOH) Interventions SDOH Screenings   Food Insecurity: No Food Insecurity (03/02/2024)  Housing: Low Risk (03/02/2024)  Transportation Needs: No Transportation Needs (03/02/2024)  Utilities: Not At Risk (03/02/2024)  Alcohol Screen: Low Risk (12/20/2023)  Social Connections: Unknown (03/02/2024)  Tobacco Use: High Risk (03/01/2024)    Readmission Risk Interventions    06/14/2023   11:56 AM 09/14/2022   10:20 AM  Readmission Risk Prevention Plan  Transportation Screening Complete Complete  PCP or Specialist Appt within 3-5 Days Complete Complete  HRI or Home Care Consult Complete Complete  Social Work Consult for Recovery Care Planning/Counseling  Complete  Palliative Care Screening Not Applicable Not Applicable  Medication Review Oceanographer) Complete Referral to Pharmacy

## 2024-03-19 NOTE — Progress Notes (Signed)
 Physical Therapy Treatment Patient Details Name: Erin Riley MRN: [id number] DOB: 10/25/1950 Today's Date: 03/19/2024   History of Present Illness 74 y.o. female admitted 03/01/24 after being found down by son with left sided weakness, slurred speech, facial droop, apnea. MRI showed acute R MCA territory infarct with 2mm leftward midline shift. Chest CTA showed interval enlargement of descending thoracic aortic aneurysm. ETT 12/27-12/28. Cotrak placed due to dysphagia; plan for peg tube placement 1/12. PMH includes HTN, hypertrophic cardiomyopathy, PAD, CKD III, HLD, schizophrenia, AAA.    PT Comments  Pt admitted with above diagnosis. Pt was able to sit EOB x 10 min with CGA. Pt needing max encouragement to participate as she was trying to lie back down periodically.  Difficult to redirect pt and pt still not attending to left hemibody.  Pt currently with functional limitations due to the deficits listed below (see PT Problem List). Pt will benefit from acute skilled PT to increase their independence and safety with mobility to allow discharge.       If plan is discharge home, recommend the following: Two people to help with walking and/or transfers;Two people to help with bathing/dressing/bathroom;Assistance with cooking/housework;Assistance with feeding;Direct supervision/assist for medications management;Assist for transportation;Direct supervision/assist for financial management;Supervision due to cognitive status   Can travel by private vehicle     No  Equipment Recommendations  Wheelchair (measurements PT);Wheelchair cushion (measurements PT);Hospital bed;Hoyer lift    Recommendations for Other Services       Precautions / Restrictions Precautions Precautions: Fall;Other (comment) Recall of Precautions/Restrictions: Impaired Precaution/Restrictions Comments: L inattention, LUE hemiplegia Restrictions Weight Bearing Restrictions Per Provider Order: No     Mobility  Bed  Mobility Overal bed mobility: Needs Assistance Bed Mobility: Rolling, Supine to Sit, Sit to Supine Rolling: Min assist, Used rails Sidelying to sit: Min assist Supine to sit: Min assist, HOB elevated Sit to supine: Min assist, HOB elevated   General bed mobility comments: min A to initiate B LE movement to EOB and trunk elevation, pt needing cues to attend to L side and L UE    Transfers                   General transfer comment: Pt only agreeable to sit EOB and this took incr time to get pt to do that. Pt did sit EOB 10 min and washed her face.    Ambulation/Gait               General Gait Details: unable at this time   Stairs             Wheelchair Mobility     Tilt Bed    Modified Rankin (Stroke Patients Only) Modified Rankin (Stroke Patients Only) Pre-Morbid Rankin Score: No symptoms Modified Rankin: Severe disability     Balance Overall balance assessment: Needs assistance Sitting-balance support: No upper extremity supported, Feet supported Sitting balance-Leahy Scale: Fair Sitting balance - Comments: verbal and physical cues for R hand placement. Pt sat EOB x ~10 minutes with CGA/min A to maintain balance during grooming/hygiene Postural control: Left lateral lean                                  Communication Communication Communication: Impaired Factors Affecting Communication: Difficulty expressing self;Reduced clarity of speech  Cognition Arousal: Alert Behavior During Therapy: Restless   PT - Cognitive impairments: Awareness, Memory, Attention, Initiation, Sequencing, Problem solving, Safety/Judgement  PT - Cognition Comments: Pt able to sit for some time EOB however not following commands with left hemibody due to inattention and pt  laying self back down and seemingly not wanting to interact at times. Has to have max encouragement to participate. Following commands: Impaired Following  commands impaired: Follows one step commands inconsistently    Cueing Cueing Techniques: Verbal cues, Gestural cues, Tactile cues, Visual cues  Exercises Other Exercises Other Exercises: supine AAROM lateral trunk rotation, and hooklying march, attempt to facilitate for bridging though pt not following    General Comments General comments (skin integrity, edema, etc.): Pt unable to attend to left hemibody at all with unaware of left UE and left LE even with cues      Pertinent Vitals/Pain Pain Assessment Pain Assessment: Faces Faces Pain Scale: Hurts even more Breathing: normal Negative Vocalization: none Facial Expression: smiling or inexpressive Body Language: relaxed Consolability: no need to console PAINAD Score: 0 Pain Location: LUE Pain Descriptors / Indicators: Guarding, Grimacing, Moaning Pain Intervention(s): Limited activity within patient's tolerance, Monitored during session, Repositioned    Home Living                          Prior Function            PT Goals (current goals can now be found in the care plan section) Acute Rehab PT Goals Patient Stated Goal: none stated this session Progress towards PT goals: Progressing toward goals    Frequency    Min 2X/week      PT Plan      Co-evaluation              AM-PAC PT 6 Clicks Mobility   Outcome Measure  Help needed turning from your back to your side while in a flat bed without using bedrails?: A Lot Help needed moving from lying on your back to sitting on the side of a flat bed without using bedrails?: A Lot Help needed moving to and from a bed to a chair (including a wheelchair)?: Total Help needed standing up from a chair using your arms (e.g., wheelchair or bedside chair)?: Total Help needed to walk in hospital room?: Total Help needed climbing 3-5 steps with a railing? : Total 6 Click Score: 8    End of Session Equipment Utilized During Treatment: Gait belt Activity  Tolerance: Other (comment);Patient limited by fatigue (treatment limited secondary to impaired cognition) Patient left: in bed;with call bell/phone within reach;with bed alarm set Nurse Communication: Mobility status;Need for lift equipment PT Visit Diagnosis: Other abnormalities of gait and mobility (R26.89);Muscle weakness (generalized) (M62.81);Other symptoms and signs involving the nervous system (R29.898);Hemiplegia and hemiparesis Hemiplegia - Right/Left: Left Hemiplegia - dominant/non-dominant: Non-dominant Hemiplegia - caused by: Cerebral infarction     Time: 8965-8946 PT Time Calculation (min) (ACUTE ONLY): 19 min  Charges:    $Therapeutic Activity: 8-22 mins PT General Charges $$ ACUTE PT VISIT: 1 Visit                     Illiana Losurdo M,PT Acute Rehab Services [phone]    Stephane JULIANNA Bevel 03/19/2024, 2:25 PM

## 2024-03-19 NOTE — Plan of Care (Signed)
" °  Problem: Nutrition: Goal: Risk of aspiration will decrease Outcome: Progressing   Problem: Education: Goal: Knowledge of disease or condition will improve Outcome: Not Progressing Goal: Knowledge of secondary prevention will improve (MUST DOCUMENT ALL) Outcome: Not Progressing   Problem: Health Behavior/Discharge Planning: Goal: Ability to manage health-related needs will improve Outcome: Not Progressing   Problem: Self-Care: Goal: Ability to participate in self-care as condition permits will improve Outcome: Not Progressing   "

## 2024-03-19 NOTE — Progress Notes (Signed)
 " PROGRESS NOTE  Erin Riley  FMW:[id number] DOB: [birth year] DOA: 03/01/2024 PCP: [name], MD   Brief Narrative: Patient is a 74 year old female with history of coronary artery disease, CKD stage 3b, hyperlipidemia, hypertension, peripheral artery disease, schizophrenia, severe aortic stenosis who was brought to the emergency department after she was found on the floor at home.  She was found to have left-sided weakness, slurred speech, facial droop.  Intubated on presentation for airway protection.  Workup also showed 7.3 X 5 cm descending thoracic aortic embolism.  Brain MRI showed acute right MCA territory infarct with 2 mm leftward midline shift .  Neurology consulted.  Stroke workup completed.  Eventually weaned off pressors and extubated.currently tolerating dysphagia 1 diet.  PT/OT recommending SNF on discharge.  Currently waiting for placement.  Assessment & Plan:  Principal Problem:   Acute CVA (cerebrovascular accident) (HCC) Active Problems:   Essential hypertension   PAD (peripheral artery disease) (HCC)   Severe aortic stenosis   Dysarthria   Dysphagia   Malnutrition of moderate degree  Subacute right MCA CVA/dysphagia/acute encephalopathy: Found on the floor at home.  Presented with  left-sided weakness, slurred speech, facial droop.  Brain MRI showed acute right MCA territory infarct with 2 mm leftward midline shift .  Neurology consulted.  Stroke workup completed.  Has significant dense left hemiplegia with neglect.  Needs SNF on discharge.  Currently on dysphagia 1 diet.  Started on Eliquis  as per neurology recommendation.  Etiology for stroke was suspected to be from HFrEF versus intracranial stenosis.  Also on aspirin , Lipitor . Remains confused, mildly agitated.  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  Not a candidate for open or  endovascular repair at this time.  Continue to monitor as an outpatient  HFrEF/nonischemic cardiomyopathy, severe aortic stenosis: Not a candidate for TAVR.  On aspirin , Lipitor , carvedilol   AKI in CKD stage IIIb: Currently kidney function is stable.  Foley discontinued  Diabetes type 2 with hyperglycemia: Continue sliding scale.  Monitor blood sugars.  Normocytic anemia: Currently hemoglobin stable  Hypertension: Currently blood pressure stable.  On amlodipine , Coreg   Dysphagia/malnutrition: Initially PEG placed was placed.  Tolerating dysphagia 1 diet.  Obesity: BMI 33.1  Debility/deconditioning: Physically debilitated due to stroke.  Needs SNF on discharge.    Nutrition Problem: Moderate Malnutrition Etiology: chronic illness Wound 03/06/24 2000 Pressure Injury Buttocks Left Stage 2 -  Partial thickness loss of dermis presenting as a shallow open injury with a red, pink wound bed without slough. (Active)    DVT prophylaxis:SCDs Start: 03/01/24 1745 apixaban  (ELIQUIS ) tablet 5 mg     Code Status: Full Code  Family Communication: None at bedside  Patient status:Inpatient  Patient is from :home  Anticipated discharge to:SNF  Estimated DC date:whenever possible   Consultants: Neurology, PCCM  Procedures: Intubation  Antimicrobials:  Anti-infectives (From admission, onward)    Start     Dose/Rate Route Frequency Ordered Stop   03/17/24 0800  ceFAZolin  (ANCEF ) IVPB 2g/100 mL premix  Status:  Discontinued        2 g 200 mL/hr over 30 Minutes Intravenous To Radiology 03/11/24 1611 03/17/24 1509   03/05/24 1130  amoxicillin -clavulanate (AUGMENTIN ) 875-125 MG per tablet 1 tablet        1 tablet Per Tube Every 12 hours 03/05/24 1036 03/07/24 2101   03/03/24 0930  cefTRIAXone  (ROCEPHIN ) 2 g in sodium chloride  0.9 % 100 mL IVPB  Status:  Discontinued        2 g 200 mL/hr over 30 Minutes Intravenous Every 24 hours 03/03/24 0838 03/05/24 1036        Subjective: Patient seen and examined at bedside today.  She was lying in bed.  She was moving her upper and lower extremities.  Does not look volume.  Confused.  Does not obey commands.  Eyes open.  Appears to be mildly agitated.  Objective: Vitals:   03/18/24 1947 03/19/24 0500 03/19/24 0546 03/19/24 0741  BP: (!) 140/89  132/86 (!) 147/88  Pulse: 74  75 73  Resp: 18  18 16   Temp: 97.8 F (36.6 C)  98.6 F (37 C) 98.2 F (36.8 C)  TempSrc: Oral  Axillary Oral  SpO2: 95%  97% 100%  Weight:  95.9 kg    Height:        Intake/Output Summary (Last 24 hours) at 03/19/2024 0756 Last data filed at 03/19/2024 0523 Gross per 24 hour  Intake 2765.56 ml  Output 850 ml  Net 1915.56 ml   Filed Weights   03/17/24 0500 03/18/24 0500 03/19/24 0500  Weight: 94.4 kg 96.1 kg 95.9 kg    Examination:  General exam: Lying on bed, obese, appears weak and deconditioned, confused HEENT: PERRL Respiratory system:  no wheezes or crackles  Cardiovascular system: Pansystolic murmur Gastrointestinal system: Abdomen is nondistended, soft and nontender. Central nervous system: Confused, dense left upper extremity hemiplegia with neglect, dysarthria, mildly agitated Extremities: No edema, no clubbing ,no cyanosis Skin: No rashes, no ulcers,no icterus     Data Reviewed: I have personally reviewed following labs and imaging studies  CBC: Recent Labs  Lab 03/14/24 0409 03/15/24 0312 03/16/24 0921 03/17/24 0511 03/18/24 0538  WBC 9.4 9.4 8.6 8.2 7.5  NEUTROABS 7.0 6.9 6.5 6.0 5.4  HGB 10.2* 9.7* 9.8* 10.0* 10.2*  HCT 32.6* 31.0* 31.7* 32.1* 32.3*  MCV 90.6 90.4 90.6 90.7 90.0  PLT 559* 547* 514* 560* 581*   Basic Metabolic Panel: Recent Labs  Lab 03/14/24 0409 03/15/24 0312 03/16/24 0921 03/17/24 0511 03/18/24 0538  NA 136 136 138 139 140  K 4.7 4.7 4.5 4.6 4.2  CL 102 103 104 105 105  CO2 24 24 27 26 26   GLUCOSE 102* 119* 126* 97 97  BUN 36* 35* 34* 32* 35*  CREATININE  1.06* 1.05* 1.05* 1.00 1.04*  CALCIUM  9.7 9.6 10.0 9.9 10.4*  MG 2.2 2.2 2.1 2.1 2.3  PHOS 3.5 3.2 2.9 3.0 3.2     No results found for this or any previous visit (from the past 240 hours).   Radiology Studies: No results found.  Scheduled Meds:  amLODipine   10 mg Oral Daily   apixaban   5 mg Oral BID   aspirin   81 mg Oral Daily   atorvastatin   80 mg Oral Daily   carvedilol   12.5 mg Oral BID WC   docusate sodium   100 mg Oral BID   insulin  aspart  0-5 Units Subcutaneous QHS   insulin  aspart  0-9 Units Subcutaneous TID WC   nicotine   14 mg Transdermal Daily   nutrition supplement (JUVEN)  1 packet Oral BID BM   OLANZapine   5 mg Oral QHS   mouth rinse  15 mL Mouth Rinse 4 times per day   polyethylene glycol  17 g Oral Daily   Continuous Infusions:  sodium chloride  Stopped (03/19/24 0523)     LOS: 18 days   Ivonne Mustache, MD [facility] P1/14/2026,  7:56 AM  "

## 2024-03-20 DIAGNOSIS — I639 Cerebral infarction, unspecified: Secondary | ICD-10-CM | POA: Diagnosis not present

## 2024-03-20 LAB — GLUCOSE, CAPILLARY
Glucose-Capillary: 97 mg/dL (ref 70–99)
Glucose-Capillary: 115 mg/dL — ABNORMAL HIGH (ref 70–99)
Glucose-Capillary: 138 mg/dL — ABNORMAL HIGH (ref 70–99)
Glucose-Capillary: 114 mg/dL — ABNORMAL HIGH (ref 70–99)

## 2024-03-20 NOTE — Progress Notes (Signed)
 " PROGRESS NOTE  Erin Riley  FMW:[id number] DOB: 1950/03/25 DOA: 03/01/2024 PCP: [name], MD   Brief Narrative: Patient is a 74 year old female with history of coronary artery disease, CKD stage 3b, hyperlipidemia, hypertension, peripheral artery disease, schizophrenia, severe aortic stenosis who was brought to the emergency department after she was found on the floor at home.  She was found to have left-sided weakness, slurred speech, facial droop.  Intubated on presentation for airway protection.  Workup also showed 7.3 X 5 cm descending thoracic aortic embolism.  Brain MRI showed acute right MCA territory infarct with 2 mm leftward midline shift .  Neurology consulted.  Stroke workup completed.  Eventually weaned off pressors and extubated.currently tolerating dysphagia 1 diet.  PT/OT recommending SNF on discharge.  Currently waiting for placement.  Assessment & Plan:  Principal Problem:   Acute CVA (cerebrovascular accident) (HCC) Active Problems:   Essential hypertension   PAD (peripheral artery disease) (HCC)   Severe aortic stenosis   Dysarthria   Dysphagia   Malnutrition of moderate degree  Subacute right MCA CVA/dysphagia/acute encephalopathy: Found on the floor at home.  Presented with  left-sided weakness, slurred speech, facial droop.  Brain MRI showed acute right MCA territory infarct with 2 mm leftward midline shift .  Neurology consulted.  Stroke workup completed.  Has significant dense left hemiplegia with neglect.  Needs SNF on discharge.  Currently on dysphagia 1 diet.  Started on Eliquis  as per neurology recommendation.  Etiology for stroke was suspected to be from HFrEF versus intracranial stenosis.  Also on aspirin , Lipitor . Remains confused,has  dysarthria.  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  Not a  candidate for open or endovascular repair at this time.  Continue to monitor as an outpatient  HFrEF/nonischemic cardiomyopathy, severe aortic stenosis: Not a candidate for TAVR.  On aspirin , Lipitor , carvedilol   AKI in CKD stage IIIb: Currently kidney function is stable.  Foley discontinued  Diabetes type 2 with hyperglycemia: Continue sliding scale.  Monitor blood sugars.  Normocytic anemia: Currently hemoglobin stable  Hypertension: Currently blood pressure stable.  On amlodipine , Coreg   Dysphagia/malnutrition:   Tolerating dysphagia 1 diet.  Obesity: BMI 33.1  Debility/deconditioning: Physically debilitated due to stroke.  Needs SNF on discharge.    Nutrition Problem: Moderate Malnutrition Etiology: chronic illness Wound 03/06/24 2000 Pressure Injury Buttocks Left Stage 2 -  Partial thickness loss of dermis presenting as a shallow open injury with a red, pink wound bed without slough. (Active)    DVT prophylaxis:SCDs Start: 03/01/24 1745 apixaban  (ELIQUIS ) tablet 5 mg     Code Status: Full Code  Family Communication: None at bedside  Patient status:Inpatient  Patient is from :home  Anticipated discharge to:SNF  Estimated DC date:whenever possible   Consultants: Neurology, PCCM  Procedures: Intubation  Antimicrobials:  Anti-infectives (From admission, onward)    Start     Dose/Rate Route Frequency Ordered Stop   03/17/24 0800  ceFAZolin  (ANCEF ) IVPB 2g/100 mL premix  Status:  Discontinued        2 g 200 mL/hr over 30 Minutes Intravenous To Radiology 03/11/24 1611 03/17/24 1509   03/05/24 1130  amoxicillin -clavulanate (AUGMENTIN ) 875-125 MG per tablet 1 tablet        1 tablet Per Tube Every 12 hours 03/05/24 1036 03/07/24 2101   03/03/24 0930  cefTRIAXone  (ROCEPHIN ) 2 g in sodium chloride  0.9 % 100 mL IVPB  Status:  Discontinued        2 g 200 mL/hr over 30 Minutes Intravenous Every 24 hours 03/03/24 0838 03/05/24 1036       Subjective: Patient seen and  examined at bedside today.  Lying in bed.  She appears better than yesterday.  More comfortable today.  She is alert and awake.  Has dense left hemiplegia.  Has dysarthria.  CT toxin overdose commands.  Not in acute distress.  Remains confused  Objective: Vitals:   03/19/24 1607 03/19/24 2005 03/20/24 0412 03/20/24 0753  BP: 105/83 139/88  (!) 161/95  Pulse: 70 66  77  Resp: 17 19  16   Temp: (!) 97.4 F (36.3 C) 97.9 F (36.6 C)  98.5 F (36.9 C)  TempSrc: Oral Oral  Oral  SpO2: 97% 98%  96%  Weight:   95.9 kg   Height:        Intake/Output Summary (Last 24 hours) at 03/20/2024 1112 Last data filed at 03/20/2024 0127 Gross per 24 hour  Intake 240 ml  Output 500 ml  Net -260 ml   Filed Weights   03/18/24 0500 03/19/24 0500 03/20/24 0412  Weight: 96.1 kg 95.9 kg 95.9 kg    Examination:  General exam: Lying on bed, obese, weak and deconditioned, confused HEENT: PERRL Respiratory system:  no wheezes or crackles  Cardiovascular system: Pansystolic murmur Gastrointestinal system: Abdomen is nondistended, soft and nontender. Central nervous system: Confused but alert and awake, obeys commands, dense left upper extremity hemiplegia with neglect Extremities: No edema, no clubbing ,no cyanosis Skin: No rashes, no ulcers,no icterus     Data Reviewed: I have personally reviewed following labs and imaging studies  CBC: Recent Labs  Lab 03/14/24 0409 03/15/24 0312 03/16/24 0921 03/17/24 0511 03/18/24 0538  WBC 9.4 9.4 8.6 8.2 7.5  NEUTROABS 7.0 6.9 6.5 6.0 5.4  HGB 10.2* 9.7* 9.8* 10.0* 10.2*  HCT 32.6* 31.0* 31.7* 32.1* 32.3*  MCV 90.6 90.4 90.6 90.7 90.0  PLT 559* 547* 514* 560* 581*   Basic Metabolic Panel: Recent Labs  Lab 03/14/24 0409 03/15/24 0312 03/16/24 0921 03/17/24 0511 03/18/24 0538  NA 136 136 138 139 140  K 4.7 4.7 4.5 4.6 4.2  CL 102 103 104 105 105  CO2 24 24 27 26 26   GLUCOSE 102* 119* 126* 97 97  BUN 36* 35* 34* 32* 35*  CREATININE 1.06*  1.05* 1.05* 1.00 1.04*  CALCIUM  9.7 9.6 10.0 9.9 10.4*  MG 2.2 2.2 2.1 2.1 2.3  PHOS 3.5 3.2 2.9 3.0 3.2     No results found for this or any previous visit (from the past 240 hours).   Radiology Studies: No results found.  Scheduled Meds:  amLODipine   10 mg Oral Daily   apixaban   5 mg Oral BID   aspirin   81 mg Oral Daily   atorvastatin   80 mg Oral Daily   carvedilol   12.5 mg Oral BID WC   docusate sodium   100 mg Oral BID   insulin  aspart  0-5 Units Subcutaneous QHS   insulin  aspart  0-9 Units Subcutaneous TID WC   nicotine   14 mg Transdermal Daily   nutrition supplement (JUVEN)  1 packet Oral BID BM   OLANZapine   5 mg Oral QHS   mouth rinse  15 mL Mouth Rinse 4 times per day   polyethylene glycol  17 g Oral Daily   Continuous Infusions:     LOS: 19 days   Ivonne Mustache, MD [facility]  P1/15/2026, 11:12 AM  "

## 2024-03-20 NOTE — Plan of Care (Signed)
" °  Problem: Education: Goal: Knowledge of disease or condition will improve Outcome: Progressing Goal: Knowledge of secondary prevention will improve (MUST DOCUMENT ALL) Outcome: Progressing Goal: Knowledge of patient specific risk factors will improve (DELETE if not current risk factor) Outcome: Progressing   Problem: Nutrition: Goal: Risk of aspiration will decrease Outcome: Progressing Goal: Dietary intake will improve Outcome: Progressing   "

## 2024-03-20 NOTE — TOC Progression Note (Addendum)
 Transition of Care [facility]) - Progression Note    Patient Details  Name: Erin Riley MRN: [id number] Date of Birth: 03/29/1950  Transition of [facility]) CM/SW Contact  Evelynne Spiers SHAUNNA [place], [place] Phone Number: 03/20/2024, 9:00 AM  Clinical Narrative:     CSW called pt's son, Erin Riley,  and left voicemail requesting return call.   1220: CSW spoke with pt's son over the phone. Son requested CSW contact him in a few more hours and he should have a decision on SNF.   1300: Son called CSW back and provided choice for [facility].   Confirmed bed with [facility]. SNF auth request sent in hub. [id number]  [facility] is pending  Expected Discharge Plan: Skilled Nursing Facility Barriers to Discharge: Continued Medical Work up, English As A Second Language Teacher               Expected Discharge Plan and Services   Discharge Planning Services: CM Consult Post Acute Care Choice: IP Rehab Living arrangements for the past 2 months: Single Family Home                                       Social Drivers of Health (SDOH) Interventions SDOH Screenings   Food Insecurity: No Food Insecurity (03/02/2024)  Housing: Low Risk (03/02/2024)  Transportation Needs: No Transportation Needs (03/02/2024)  Utilities: Not At Risk (03/02/2024)  Alcohol Screen: Low Risk (12/20/2023)  Social Connections: Unknown (03/02/2024)  Tobacco Use: High Risk (03/01/2024)    Readmission Risk Interventions    06/14/2023   11:56 AM 09/14/2022   10:20 AM  Readmission Risk Prevention Plan  Transportation Screening Complete Complete  PCP or Specialist Appt within 3-5 Days Complete Complete  HRI or Home Care Consult Complete Complete  Social Work Consult for Recovery Care Planning/Counseling  Complete  Palliative Care Screening Not Applicable Not Applicable  Medication Review Oceanographer) Complete Referral to Pharmacy

## 2024-03-21 ENCOUNTER — Inpatient Hospital Stay (HOSPITAL_COMMUNITY)

## 2024-03-21 DIAGNOSIS — I639 Cerebral infarction, unspecified: Secondary | ICD-10-CM | POA: Diagnosis not present

## 2024-03-21 LAB — BASIC METABOLIC PANEL WITH GFR
Anion gap: 8 (ref 5–15)
BUN: 28 mg/dL — ABNORMAL HIGH (ref 8–23)
CO2: 27 mmol/L (ref 22–32)
Calcium: 10.2 mg/dL (ref 8.9–10.3)
Chloride: 109 mmol/L (ref 98–111)
Creatinine, Ser: 0.98 mg/dL (ref 0.44–1.00)
GFR, Estimated: >60 mL/min
Glucose, Bld: 108 mg/dL — ABNORMAL HIGH (ref 70–99)
Potassium: 3.8 mmol/L (ref 3.5–5.1)
Sodium: 144 mmol/L (ref 135–145)

## 2024-03-21 LAB — CBC
HCT: 33.4 % — ABNORMAL LOW (ref 36.0–46.0)
Hemoglobin: 10.3 g/dL — ABNORMAL LOW (ref 12.0–15.0)
MCH: 28 pg (ref 26.0–34.0)
MCHC: 30.8 g/dL (ref 30.0–36.0)
MCV: 90.8 fL (ref 80.0–100.0)
Platelets: 540 K/uL — ABNORMAL HIGH (ref 150–400)
RBC: 3.68 MIL/uL — ABNORMAL LOW (ref 3.87–5.11)
RDW: 14.6 % (ref 11.5–15.5)
WBC: 6.2 K/uL (ref 4.0–10.5)
nRBC: 0 % (ref 0.0–0.2)

## 2024-03-21 LAB — GLUCOSE, CAPILLARY
Glucose-Capillary: 101 mg/dL — ABNORMAL HIGH (ref 70–99)
Glucose-Capillary: 144 mg/dL — ABNORMAL HIGH (ref 70–99)

## 2024-03-21 MED ORDER — NICOTINE 14 MG/24HR TD PT24: 14.0000 mg | MEDICATED_PATCH | Freq: Every day | TRANSDERMAL | Status: AC

## 2024-03-21 MED ORDER — OLANZAPINE 5 MG PO TABS: 5.0000 mg | ORAL_TABLET | Freq: Every day | ORAL | Status: AC

## 2024-03-21 MED ORDER — ATORVASTATIN CALCIUM 80 MG PO TABS: 80.0000 mg | ORAL_TABLET | Freq: Every day | ORAL | Status: AC

## 2024-03-21 MED ORDER — CARVEDILOL 12.5 MG PO TABS: 12.5000 mg | ORAL_TABLET | Freq: Two times a day (BID) | ORAL | Status: AC

## 2024-03-21 MED ORDER — POLYETHYLENE GLYCOL 3350 17 G PO PACK: 17.0000 g | PACK | Freq: Every day | ORAL | Status: AC

## 2024-03-21 MED ORDER — DOCUSATE SODIUM 100 MG PO CAPS: 100.0000 mg | ORAL_CAPSULE | Freq: Two times a day (BID) | ORAL | Status: AC

## 2024-03-21 NOTE — Plan of Care (Signed)
" °  Problem: Education: Goal: Knowledge of disease or condition will improve Outcome: Not Progressing   Problem: Ischemic Stroke/TIA Tissue Perfusion: Goal: Complications of ischemic stroke/TIA will be minimized Outcome: Progressing   Problem: Coping: Goal: Will verbalize positive feelings about self Outcome: Progressing Goal: Will identify appropriate support needs Outcome: Progressing   Problem: Health Behavior/Discharge Planning: Goal: Ability to manage health-related needs will improve Outcome: Progressing Goal: Goals will be collaboratively established with patient/family Outcome: Progressing   Problem: Self-Care: Goal: Ability to participate in self-care as condition permits will improve Outcome: Not Progressing Goal: Verbalization of feelings and concerns over difficulty with self-care will improve Outcome: Progressing Goal: Ability to communicate needs accurately will improve Outcome: Progressing   Problem: Nutrition: Goal: Risk of aspiration will decrease Outcome: Progressing Goal: Dietary intake will improve Outcome: Progressing   "

## 2024-03-21 NOTE — Plan of Care (Signed)
" °  Problem: Education: Goal: Knowledge of disease or condition will improve 03/21/2024 0204 by Bular Hickok, RN Outcome: Not Progressing 03/21/2024 0202 by Jamielee Mchale, RN Outcome: Not Progressing   Problem: Ischemic Stroke/TIA Tissue Perfusion: Goal: Complications of ischemic stroke/TIA will be minimized 03/21/2024 0204 by Aleeyah Bensen, RN Outcome: Progressing 03/21/2024 0202 by Summer Parthasarathy, RN Outcome: Progressing   Problem: Coping: Goal: Will verbalize positive feelings about self Outcome: Progressing Goal: Will identify appropriate support needs Outcome: Progressing   Problem: Health Behavior/Discharge Planning: Goal: Ability to manage health-related needs will improve Outcome: Progressing Goal: Goals will be collaboratively established with patient/family Outcome: Progressing   Problem: Self-Care: Goal: Ability to participate in self-care as condition permits will improve 03/21/2024 0204 by Javaun Dimperio, RN Outcome: Not Progressing 03/21/2024 0202 by Cassidee Deats, RN Outcome: Not Progressing Goal: Verbalization of feelings and concerns over difficulty with self-care will improve Outcome: Progressing Goal: Ability to communicate needs accurately will improve Outcome: Progressing   Problem: Nutrition: Goal: Risk of aspiration will decrease 03/21/2024 0204 by Clary Meeker, RN Outcome: Progressing 03/21/2024 0202 by Jayon Matton, RN Outcome: Progressing Goal: Dietary intake will improve Outcome: Progressing   "

## 2024-03-21 NOTE — Progress Notes (Signed)
 Physical Therapy Treatment Patient Details Name: Erin Riley MRN: [id number] DOB: [birth year] Today's Date: 03/21/2024   History of Present Illness 74 y.o. female admitted 03/01/24 after being found down by son with left sided weakness, slurred speech, facial droop, apnea. MRI showed acute R MCA territory infarct with 2mm leftward midline shift. Chest CTA showed interval enlargement of descending thoracic aortic aneurysm. ETT 12/27-12/28. Cotrak placed due to dysphagia; plan for peg tube placement 1/12. PMH includes HTN, hypertrophic cardiomyopathy, PAD, CKD III, HLD, schizophrenia, AAA.    PT Comments  Pt admitted with above diagnosis. Co rx with OT with pt tolerating sitting EOB with CGA to min assist. Attempted to engage pt in standing activity without success even with total assist +2.   Pt continues to be internally distracted and perseverating today on cigarettes.  Pt continues to be able to sit up with max encouragement intiailly to participate and assist to come to EOB, however cannot progress pt further than sitting due to cognitive deficits and left inattention. Will continue to progress pt as able. Pt currently with functional limitations due to the deficits listed below (see PT Problem List). Pt will benefit from acute skilled PT to increase their independence and safety with mobility to allow discharge.       If plan is discharge home, recommend the following: Two people to help with walking and/or transfers;Two people to help with bathing/dressing/bathroom;Assistance with cooking/housework;Assistance with feeding;Direct supervision/assist for medications management;Assist for transportation;Direct supervision/assist for financial management;Supervision due to cognitive status   Can travel by Doctor, Hospital (measurements PT);Wheelchair cushion (measurements PT);Hospital bed;Hoyer lift    Recommendations for Other Services        Precautions / Restrictions Precautions Precautions: Fall;Other (comment) Recall of Precautions/Restrictions: Impaired Precaution/Restrictions Comments: L inattention, LUE hemiplegia Restrictions Weight Bearing Restrictions Per Provider Order: No     Mobility  Bed Mobility Overal bed mobility: Needs Assistance Bed Mobility: Supine to Sit, Sit to Supine     Supine to sit: Total assist, +2 for physical assistance, +2 for safety/equipment Sit to supine: Total assist, +2 for physical assistance, +2 for safety/equipment   General bed mobility comments: Total A x 2 to sit EOB due to initial lethargy, Total A x 2 to lay back in bed d/t pt resistance and reaching to pull/hold onto footboard    Transfers Overall transfer level: Needs assistance Equipment used: None Transfers: Sit to/from Stand             General transfer comment: Attempted various strategies to facilitate standing w/ pt resistant to handheld assist, pulling on gait belt. pt did briefly put R hand on back of chair to initiate lifting bottom during session with Mod A x2 for partial/very limited stand from bedside when pt was trying to get blanket out from under her.  Difficult to find a functional task that pt wants to do.  Tried to see if she would get onto potty chair however pt just kept asking for a cigarette and was perseverating on this most of session.  Gave pt a straw and for a bit pt did put it in her mouth as if it was a cigarette.    Ambulation/Gait               General Gait Details: unable at this time   Comptroller  Bed    Modified Rankin (Stroke Patients Only) Modified Rankin (Stroke Patients Only) Pre-Morbid Rankin Score: No symptoms Modified Rankin: Severe disability     Balance Overall balance assessment: Needs assistance Sitting-balance support: No upper extremity supported, Feet supported Sitting balance-Leahy Scale: Fair Sitting balance  - Comments: verbal and physical cues for R hand placement. Pt sat EOB x 18 minutes with CGA/min A to maintain balance while PT and OT trying to engage pt to participate. Postural control: Left lateral lean Standing balance support: Bilateral upper extremity supported, During functional activity Standing balance-Leahy Scale: Zero Standing balance comment: completely dependent on therapist assist                            Communication Communication Communication: Impaired Factors Affecting Communication: Difficulty expressing self;Reduced clarity of speech  Cognition Arousal: Alert, Lethargic Behavior During Therapy: Restless   PT - Cognitive impairments: Awareness, Memory, Attention, Initiation, Sequencing, Problem solving, Safety/Judgement                       PT - Cognition Comments: Pt able to sit for some time EOB however not following commands with left hemibody due to inattention. Has to have max encouragement to participate as pt perseverating today on cigarettes. Following commands: Impaired Following commands impaired: Follows one step commands inconsistently    Cueing Cueing Techniques: Verbal cues, Gestural cues, Tactile cues, Visual cues  Exercises Other Exercises Other Exercises: pt kicking right LE to command but no movement of left LE to command    General Comments General comments (skin integrity, edema, etc.): Pt not able to attend to left hemibody with pt unaware of left UE and left LE even with cues  Only tracked therapist to midline once.      Pertinent Vitals/Pain Pain Assessment Pain Assessment: Faces Faces Pain Scale: Hurts even more Breathing: normal Negative Vocalization: none Facial Expression: smiling or inexpressive Body Language: relaxed Consolability: no need to console PAINAD Score: 0 Pain Location: left UE Pain Descriptors / Indicators: Guarding, Grimacing, Moaning Pain Intervention(s): Limited activity within patient's  tolerance, Monitored during session, Repositioned    Home Living                          Prior Function            PT Goals (current goals can now be found in the care plan section) Progress towards PT goals: Progressing toward goals    Frequency    Min 2X/week      PT Plan      Co-evaluation PT/OT/SLP Co-Evaluation/Treatment: Yes Reason for Co-Treatment: Necessary to address cognition/behavior during functional activity;Complexity of the patient's impairments (multi-system involvement);For patient/therapist safety PT goals addressed during session: Balance;Strengthening/ROM;Mobility/safety with mobility OT goals addressed during session: ADL's and self-care;Strengthening/ROM      AM-PAC PT 6 Clicks Mobility   Outcome Measure  Help needed turning from your back to your side while in a flat bed without using bedrails?: A Lot Help needed moving from lying on your back to sitting on the side of a flat bed without using bedrails?: A Lot Help needed moving to and from a bed to a chair (including a wheelchair)?: Total Help needed standing up from a chair using your arms (e.g., wheelchair or bedside chair)?: Total Help needed to walk in hospital room?: Total Help needed climbing 3-5 steps with a railing? : Total 6  Click Score: 8    End of Session Equipment Utilized During Treatment: Gait belt Activity Tolerance: Other (comment);Patient limited by fatigue (treatment limited secondary to impaired cognition) Patient left: in bed;with call bell/phone within reach;with bed alarm set Nurse Communication: Mobility status;Need for lift equipment PT Visit Diagnosis: Other abnormalities of gait and mobility (R26.89);Muscle weakness (generalized) (M62.81);Other symptoms and signs involving the nervous system (R29.898);Hemiplegia and hemiparesis     Time: 8666-8590 PT Time Calculation (min) (ACUTE ONLY): 36 min  Charges:    $Therapeutic Activity: 8-22 mins PT General  Charges $$ ACUTE PT VISIT: 1 Visit                     Jashley Yellin M,PT Acute Rehab Services [phone]    Stephane JULIANNA Bevel 03/21/2024, 4:35 PM

## 2024-03-21 NOTE — Progress Notes (Signed)
 Report called to receiving facility and discharge packet printed out for [facility]. PIV discontinued without complication, catheter in tact, and purewick removed, skin in tact. Patient off unit and transported via ground ambulance with personal belongings to [facility].

## 2024-03-21 NOTE — Progress Notes (Signed)
 Occupational Therapy Treatment Patient Details Name: Erin Riley MRN: [id number] DOB: 09-Nov-1950 Today's Date: 03/21/2024   History of present illness 74 y.o. female admitted 03/01/24 after being found down by son with left sided weakness, slurred speech, facial droop, apnea. MRI showed acute R MCA territory infarct with 2mm leftward midline shift. Chest CTA showed interval enlargement of descending thoracic aortic aneurysm. ETT 12/27-12/28. Cotrak placed due to dysphagia; plan for peg tube placement 1/12. PMH includes HTN, hypertrophic cardiomyopathy, PAD, CKD III, HLD, schizophrenia, AAA.   OT comments  Pt with slow progress towards OT goals due to L inattention and cognitive deficits (attention deficits most impactful on function). Pt initially lethargic and requiring +2 for bed mobility but once alert, able to sit EOB without LOB. Pt perseverating on need for cigarette and difficult to redirect during session. Attempted various methods in attempts to progress standing w/ pt able to briefly initiate lifting bottom from bed to straighten bed linens. Pt with R gaze preference throughout session with brief eye contact made at midline. Patient will benefit from continued inpatient follow up therapy, <3 hours/day at DC.      If plan is discharge home, recommend the following:  Two people to help with walking and/or transfers;Assist for transportation;Supervision due to cognitive status;A lot of help with bathing/dressing/bathroom;Assistance with feeding;Direct supervision/assist for medications management   Equipment Recommendations  Other (comment) (TBD)    Recommendations for Other Services      Precautions / Restrictions Precautions Precautions: Fall;Other (comment) Recall of Precautions/Restrictions: Impaired Precaution/Restrictions Comments: L inattention, LUE hemiplegia Restrictions Weight Bearing Restrictions Per Provider Order: No       Mobility Bed Mobility Overal bed mobility:  Needs Assistance Bed Mobility: Supine to Sit, Sit to Supine     Supine to sit: Total assist, +2 for physical assistance, +2 for safety/equipment Sit to supine: Total assist, +2 for physical assistance, +2 for safety/equipment   General bed mobility comments: Total A x 2 to sit EOB due to initial lethargy, Total A x 2 to lay back in bed d/t pt resistance and reaching to pull/hold onto footboard    Transfers Overall transfer level: Needs assistance Equipment used: None Transfers: Sit to/from Stand             General transfer comment: Attempted various strategies to facilitate standing w/ pt resistant to handheld assist, pulling on gait belt. pt did briefly put R hand on back of chair to initiate lifting bottom during session with Mod A x2 for partial/very limited stand from bedside     Balance Overall balance assessment: Needs assistance Sitting-balance support: No upper extremity supported, Feet supported Sitting balance-Leahy Scale: Fair                                     ADL either performed or assessed with clinical judgement   ADL Overall ADL's : Needs assistance/impaired     Grooming: Maximal assistance;Bed level;Wash/dry face               Lower Body Dressing: Total assistance;Bed level                 General ADL Comments: Limited by poor attention. Attempting to get to participate in basic ADLs and to initiate standing today    Extremity/Trunk Assessment Upper Extremity Assessment Upper Extremity Assessment: Right hand dominant;LUE deficits/detail LUE Deficits / Details: L UE flaccid, some edema noted. impaired  proprioception LUE Sensation: decreased light touch;decreased proprioception LUE Coordination: decreased fine motor;decreased gross motor   Lower Extremity Assessment Lower Extremity Assessment: Defer to PT evaluation        Vision   Vision Assessment?: Vision impaired- to be further tested in functional  context Additional Comments: L inattention, preference for R visual field gaze, able to briefly look to make eye contact in midline but difficult to get to look to L side (potentially also due to impaired attention)   Perception Perception Perception: Impaired Preception Impairment Details: Inattention/Neglect   Praxis Praxis Praxis: Impaired Praxis Impairment Details: Initiation;Motor planning;Limb apraxia   Communication Communication Communication: Impaired Factors Affecting Communication: Difficulty expressing self;Reduced clarity of speech   Cognition Arousal: Alert, Lethargic Behavior During Therapy: Restless Cognition: Cognition impaired, Difficult to assess Difficult to assess due to: Impaired communication   Awareness: Intellectual awareness impaired, Online awareness impaired Memory impairment (select all impairments): Short-term memory, Working memory, Non-declarative long-term memory, Geneticist, Molecular long-term memory Attention impairment (select first level of impairment): Focused attention Executive functioning impairment (select all impairments): Initiation, Organization, Sequencing, Reasoning, Problem solving OT - Cognition Comments: Pt perseverating on wanting a cigarette, very easily distracted often looking over shoulder to R side. Inconsistent command following. poor initiation in cues to attempt to stand but when attempting to pull blanket from under bottom, pt able to then initiate lifting bottom from bed. initially lethargic but once awaken, remained alert during session                 Following commands: Impaired Following commands impaired: Follows one step commands inconsistently      Cueing   Cueing Techniques: Verbal cues, Gestural cues, Tactile cues, Visual cues  Exercises      Shoulder Instructions       General Comments      Pertinent Vitals/ Pain       Pain Assessment Pain Assessment: No/denies pain  Home Living                                           Prior Functioning/Environment              Frequency  Min 2X/week        Progress Toward Goals  OT Goals(current goals can now be found in the care plan section)  Progress towards OT goals: Not progressing toward goals - comment  Acute Rehab OT Goals Patient Stated Goal: get a cigarette OT Goal Formulation: Patient unable to participate in goal setting Time For Goal Achievement: 03/28/24 Potential to Achieve Goals: Good ADL Goals Pt Will Perform Eating: with min assist;sitting Pt Will Perform Grooming: with supervision;sitting Pt Will Perform Upper Body Dressing: with mod assist;sitting Pt Will Perform Lower Body Dressing: with max assist;sit to/from stand Pt Will Transfer to Toilet: with max assist;squat pivot transfer;bedside commode Pt Will Perform Toileting - Clothing Manipulation and hygiene: with max assist;sitting/lateral leans Pt/caregiver will Perform Home Exercise Program: Left upper extremity;With Supervision;With written HEP provided  Plan      Co-evaluation    PT/OT/SLP Co-Evaluation/Treatment: Yes Reason for Co-Treatment: Necessary to address cognition/behavior during functional activity;Complexity of the patient's impairments (multi-system involvement);For patient/therapist safety PT goals addressed during session: Balance;Strengthening/ROM OT goals addressed during session: ADL's and self-care;Strengthening/ROM      AM-PAC OT 6 Clicks Daily Activity     Outcome Measure   Help from another person eating meals?:  A Lot Help from another person taking care of personal grooming?: A Lot Help from another person toileting, which includes using toliet, bedpan, or urinal?: Total Help from another person bathing (including washing, rinsing, drying)?: A Lot Help from another person to put on and taking off regular upper body clothing?: A Lot Help from another person to put on and taking off regular lower body clothing?:  Total 6 Click Score: 10    End of Session Equipment Utilized During Treatment: Gait belt  OT Visit Diagnosis: Other abnormalities of gait and mobility (R26.89);Muscle weakness (generalized) (M62.81);Low vision, both eyes (H54.2);Other symptoms and signs involving the nervous system (R29.898);Other symptoms and signs involving cognitive function;Hemiplegia and hemiparesis Hemiplegia - Right/Left: Left Hemiplegia - dominant/non-dominant: Non-Dominant Hemiplegia - caused by: Cerebral infarction   Activity Tolerance Other (comment) (limited by cognition)   Patient Left in bed;with call bell/phone within reach;with bed alarm set   Nurse Communication          Time: 8667-8591 OT Time Calculation (min): 36 min  Charges: OT General Charges $OT Visit: 1 Visit OT Treatments $Self Care/Home Management : 8-22 mins  Mliss NOVAK, OTR/L Acute Rehab Services Office: [phone]   Mliss Fish 03/21/2024, 2:21 PM

## 2024-03-21 NOTE — Discharge Summary (Addendum)
 Physician Discharge Summary  Erin Riley FMW:[id number] DOB: November 07, 1950 DOA: 03/01/2024  PCP: [name], MD  Admit date: 03/01/2024 Discharge date: 03/21/2024  Admitted From: Home Disposition: SNF  Discharge Condition:Stable CODE STATUS:FULL Diet recommendation: Dysphagia 1,nectar thick  Brief/Interim Summary: Patient is a 74 year old female with history of coronary artery disease, CKD stage 3b, hyperlipidemia, hypertension, peripheral artery disease, schizophrenia, severe aortic stenosis who was brought to the emergency department after she was found on the floor at home.  She was found to have left-sided weakness, slurred speech, facial droop.  Intubated on presentation for airway protection.  Workup also showed 7.3 X 5 cm descending thoracic aortic embolism.  Brain MRI showed acute right MCA territory infarct with 2 mm leftward midline shift .  Neurology consulted.  Stroke workup completed.  Eventually weaned off pressors and extubated.currently tolerating dysphagia 1 diet.  PT/OT recommending SNF on discharge.  Clinically stable for discharge to SNF today  Following problems were addressed during the hospitalization:  Subacute right MCA CVA/dysphagia/acute encephalopathy: Found on the floor at home.  Presented with  left-sided weakness, slurred speech, facial droop.  Brain MRI showed acute right MCA territory infarct with 2 mm leftward midline shift .  Neurology consulted.  Stroke workup completed.  Has significant dense left hemiplegia with neglect.  Needs SNF on discharge.  Currently on dysphagia 1 diet.  Started on Eliquis  as per neurology recommendation.  Etiology for stroke was suspected to be from HFrEF versus intracranial stenosis.  Also on aspirin , Lipitor . Remains confused,has  dysarthria.  Obeys commands.  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  Not a candidate for open or endovascular repair at this time.  Continue to monitor as an outpatient   HFrEF/nonischemic cardiomyopathy, severe aortic stenosis: Not a candidate for TAVR.  On aspirin , Lipitor , carvedilol    AKI in CKD stage IIIb: Currently kidney function is stable.  Foley discontinued   Normocytic anemia: Currently hemoglobin stable   Hypertension: Currently blood pressure stable.  On amlodipine , Coreg    Dysphagia/malnutrition:   Tolerating dysphagia 1 diet.   Obesity: BMI 33.1   Debility/deconditioning: Physically debilitated due to stroke.  Needs SNF on discharge.     Discharge Diagnoses:  Principal Problem:   Acute CVA (cerebrovascular accident) [facility]) Active Problems:   Essential hypertension   PAD (peripheral artery disease) (HCC)   Severe aortic stenosis   Dysarthria   Dysphagia   Malnutrition of moderate degree    Discharge Instructions  Discharge Instructions     Ambulatory referral to Neurology   Complete by: As directed    An appointment is requested in approximately: 2 weeks   Diet general   Complete by: As directed    Dysphagia 1,nectar thick   Discharge instructions   Complete by: As directed    1)Please take your medications as instructed 2)Follow up with vascular surgery, neurology as an outpatient.  Name and number the provider has been attached.   Increase activity slowly   Complete by: As directed    No wound care   Complete by: As directed       Allergies as of 03/21/2024   No Known Allergies      Medication List     STOP taking these medications    empagliflozin  10 MG Tabs tablet Commonly known as: JARDIANCE    furosemide  40 MG tablet Commonly  known as: LASIX    potassium chloride  10 MEQ tablet Commonly known as: KLOR-CON  M   sacubitril -valsartan  49-51 MG Commonly known as: Entresto        TAKE these medications    amLODipine  10  MG tablet Commonly known as: NORVASC  Take 1 tablet (10 mg total) by mouth daily.   aspirin  EC 81 MG tablet Take 1 tablet (81 mg total) by mouth daily. Swallow whole.   atorvastatin  80 MG tablet Commonly known as: LIPITOR  Take 1 tablet (80 mg total) by mouth daily. What changed:  medication strength how much to take when to take this   carvedilol  12.5 MG tablet Commonly known as: COREG  Take 1 tablet (12.5 mg total) by mouth 2 (two) times daily with a meal. What changed:  medication strength how much to take   docusate sodium  100 MG capsule Commonly known as: COLACE Take 1 capsule (100 mg total) by mouth 2 (two) times daily.   FeroSul 325 (65 Fe) MG tablet Generic drug: ferrous sulfate  Take 1 tablet (325 mg total) by mouth daily with breakfast.   nicotine  14 mg/24hr patch Commonly known as: NICODERM CQ  - dosed in mg/24 hours Place 1 patch (14 mg total) onto the skin daily.   OLANZapine  5 MG tablet Commonly known as: ZYPREXA  Take 1 tablet (5 mg total) by mouth at bedtime. What changed:  medication strength how much to take   polyethylene glycol 17 g packet Commonly known as: MIRALAX  / GLYCOLAX  Take 17 g by mouth daily.        Contact information for follow-up providers     [facility]. Schedule an appointment as soon as possible for a visit in 2 week(s).   Specialty: Neurology Contact information: [address] [place]  [address] [phone]        Magda Debby SAILOR, MD. Schedule an appointment as soon as possible for a visit in 2 week(s).   Specialties: Vascular Surgery, Interventional Cardiology Contact information: [address] [place] [address] [phone]              Contact information for after-discharge care     Destination     [facility] .   Service: Skilled Nursing Contact information: [address] [place]  [address] [phone]                     Allergies[1]  Consultations: Neurology, vascular surgery, critical care   Procedures/Studies: DG Swallowing Func-Speech Pathology Result Date: 03/13/2024 Table formatting from the original result was not included. Modified Barium Swallow Study Patient Details Name: Erin Riley MRN: [id number] Date of Birth: 1950-04-06 Today's Date: 03/13/2024 HPI/PMH: HPI: Erin Riley is a 74 y.o. female who presented to the hospital from home on 03/01/24 after being found by her son with left sided weakness, slurred speech and facial droop. She was intubated upon arrival to ER. CXR did not show acute abnormality, CT chest did not show any focal consolidation or PE. 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 She was extubated on 03/02/24 at 1305 to oxygen via [place]. PMH: HTN, severe aortic stenosis, nonischemic cardiomyopathy, CKD stage IIIb, schizophrenia. MBSS completed on 12/29 with recommendation to continue NPO with the exception of puree snacks. Attempted MBS repeat MBS 1/5 however she refused. Pt accepting po at bedside and repeat MBS today for possible initiation of po. Clinical Impression: Clinical Impression: Pt's oropharyngeal swallow function has improved from prior  study with significantly decreased aspiration episodes with deficits in swallow safety versus efficiency. She continues with reduced labial seal, lingual control of thin spilling to vocal cords with trace aspiration during second sip with reflexive cough unable to determine if clearance achieved. Following cough there was retrograde movement into pyriforms from PES. She has disorganized lingual propulsion and lingual movements. Pharyngeally although there is reduced anterior hyoid excursion and intermittent partial epiglottic excursion her laryngeal elevation and glottic closure appear adequate. Her primary impairment is poor timing and onset of swallow with frequent  entrance of barium into vestibule prior to swallow initiation with thin and nectar. No sensation of nectar to vocal cords although not visible on following frame. Flash penetration with honey and puree with good efficiency of swallow and no significant pharyngeal residue. Esophageal scan revealed barium in distal esophagus with mild retrograde movement. Recommend initiate puree, honey thick liquids, pills crushed, check for pocketing, full supervision and assist due to cognitive deficits and stay upright after meals. ST to follow. Factors that may increase risk of adverse event in presence of aspiration Noe & Lianne 2021): Factors that may increase risk of adverse event in presence of aspiration Noe & Lianne 2021): Reduced cognitive function; Limited mobility Recommendations/Plan: Swallowing Evaluation Recommendations Swallowing Evaluation Recommendations Recommendations: PO diet PO Diet Recommendation: Dysphagia 1 (Pureed); Moderately thick liquids (Level 3, honey thick) Liquid Administration via: Cup Medication Administration: Crushed with puree Supervision: Staff to assist with self-feeding; Full supervision/cueing for swallowing strategies Swallowing strategies  : Slow rate; Small bites/sips; Check for pocketing or oral holding Postural changes: Position pt fully upright for meals; Stay upright 30-60 min after meals Oral care recommendations: Oral care BID (2x/day) Treatment Plan Treatment Plan Treatment recommendations: Therapy as outlined in treatment plan below Follow-up recommendations: Skilled nursing-short term rehab (<3 hours/day) Functional status assessment: Patient has had a recent decline in their functional status and demonstrates the ability to make significant improvements in function in a reasonable and predictable amount of time. Treatment frequency: Min 2x/week Treatment duration: 2 weeks Interventions: Compensatory techniques; Patient/family education; Trials of upgraded  texture/liquids; Diet toleration management by SLP Recommendations Recommendations for follow up therapy are one component of a multi-disciplinary discharge planning process, led by the attending physician.  Recommendations may be updated based on patient status, additional functional criteria and insurance authorization. Assessment: Orofacial Exam: Orofacial Exam Oral Cavity: Oral Hygiene: WFL Oral Cavity - Dentition: Edentulous; Dentures, not available Orofacial Anatomy: Other (comment) Oral Motor/Sensory Function: Suspected cranial nerve impairment CN V - Trigeminal: Left motor impairment CN VII - Facial: Left motor impairment Anatomy: Anatomy: WFL Boluses Administered: Boluses Administered Boluses Administered: Thin liquids (Level 0); Mildly thick liquids (Level 2, nectar thick); Moderately thick liquids (Level 3, honey thick); Puree  Oral Impairment Domain: Oral Impairment Domain Lip Closure: Escape progressing to mid-chin Tongue control during bolus hold: Posterior escape of less than half of bolus Bolus preparation/mastication: -- (NT) Bolus transport/lingual motion: Repetitive/disorganized tongue motion Oral residue: Residue collection on oral structures Location of oral residue : Floor of mouth; Tongue Initiation of pharyngeal swallow : Posterior laryngeal surface of the epiglottis  Pharyngeal Impairment Domain: Pharyngeal Impairment Domain Soft palate elevation: No bolus between soft palate (SP)/pharyngeal wall (PW) Laryngeal elevation: Complete superior movement of thyroid  cartilage with complete approximation of arytenoids to epiglottic petiole (material only present due to poor timing of elevation) Anterior hyoid excursion: Partial anterior movement Epiglottic movement: Partial inversion Laryngeal vestibule closure: Complete, no air/contrast in laryngeal vestibule (material present due to poor  timing and onset) Pharyngeal stripping wave : Present - complete Pharyngeal contraction (A/P view only): N/A  Pharyngoesophageal segment opening: Partial distention/partial duration, partial obstruction of flow (prominent cricopharyngeus with honey, retrograde movement wtih thin into pyriforms x 1) Tongue base retraction: Trace column of contrast or air between tongue base and PPW Pharyngeal residue: Trace residue within or on pharyngeal structures Location of pharyngeal residue: Valleculae (several instances, mostly clears pharynx)  Esophageal Impairment Domain: Esophageal Impairment Domain Esophageal clearance upright position: Esophageal retention Pill: No data recorded Penetration/Aspiration Scale Score: Penetration/Aspiration Scale Score 2.  Material enters airway, remains ABOVE vocal cords then ejected out: Puree; Moderately thick liquids (Level 3, honey thick); Mildly thick liquids (Level 2, nectar thick) 5.  Material enters airway, CONTACTS cords and not ejected out: Mildly thick liquids (Level 2, nectar thick) 7.  Material enters airway, passes BELOW cords and not ejected out despite cough attempt by patient: Thin liquids (Level 0); Mildly thick liquids (Level 2, nectar thick); Moderately thick liquids (Level 3, honey thick) Compensatory Strategies: Compensatory Strategies Compensatory strategies: No   General Information: Caregiver present: No  Diet Prior to this Study: NPO   Temperature : Normal   Respiratory Status: WFL   Supplemental O2: None (Room air)   History of Recent Intubation: Yes  Behavior/Cognition: Alert; Cooperative; Confused; Distractible; Requires cueing Self-Feeding Abilities: Needs assist with self-feeding Baseline vocal quality/speech: Normal Volitional Cough: Unable to elicit Volitional Swallow: Unable to elicit Exam Limitations: No limitations Goal Planning: Prognosis for improved oropharyngeal function: -- (fair-good) Barriers to Reach Goals: Cognitive deficits No data recorded No data recorded Consulted and agree with results and recommendations: Patient; Nurse Pain: Pain Assessment Pain  Assessment: No/denies pain Faces Pain Scale: 0 Breathing: 0 Negative Vocalization: 0 Facial Expression: 0 Body Language: 0 Consolability: 0 PAINAD Score: 0 End of Session: Start Time:SLP Start Time (ACUTE ONLY): 1405 Stop Time: SLP Stop Time (ACUTE ONLY): 1417 Time Calculation:SLP Time Calculation (min) (ACUTE ONLY): 12 min Charges: SLP Evaluations $ SLP Speech Visit: 1 Visit SLP Evaluations $MBS Swallow: 1 Procedure $Swallowing Treatment: 1 Procedure SLP visit diagnosis: SLP Visit Diagnosis: Dysphagia, oropharyngeal phase (R13.12) Past Medical History: Past Medical History: Diagnosis Date  Chronic back pain   Chronic kidney disease (CKD), stage II (mild)   Class I-II  Coronary artery disease 04/2009  50% stenosis in the perforator of LAD; catheterization was for an abnormal Myoview in January 2000 showing anterior and inferolateral ischemia.  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 > 3 subsequent Echos show improved/normal EF with moderate to severe concentric LVH and diastolic dysfunction with LVOT/intracavitary gradient --> 06/2016: Severe LVH.  Vigorous EF, 65-70%.?? Gr 1 DD. Mild AS.  Hyperlipidemia   Hypertension   Hypertensive hypertrophic cardiomyopathy: NYHA class II:  Echo: Severe concentric LVH with LV OT gradient; essentially preserved EF with diastolic dysfunction 02/15/2013  Echo 06/2016: Severe Concentric LVH. Vigorous EF 65-70%. ~ Gr I DD.   Mild aortic stenosis by prior echocardiography   Echo 06/2016: Mild AS (Mean Gradient 15 mmHg); has had prior Mod-Severe MR (not seen on current echo)  PAD (peripheral artery disease) March 2013  Lower extremity Dopplers: R. SFA 50-60%, R. PTA proximally occluded with distal reconstitution;; L. common iliac ~50%, L. SFA 50-70% stenosis, L. PTA < 50%  Schizophrenia (HCC)  Past Surgical History: Past Surgical History: Procedure Laterality Date  BUNIONECTOMY     carotid doppler  05/29/2011  left bulb/prox ICA moderate amtfibrous plaque with no evidence significant reduction.,right bulb /proximal ICA normal patency  lower extremity doppler  05/29/2011  right SFA 50% to 59% diameter reduction,right posterior tibal atreery occlusive disease,reconstituting distally, left common illiac<50%,left SFA 50 to70%,left post. tibial <50%  NM MYOCAR PERF WALL MOTION  03/2009  Persantine; EF 51%-both anterior and inferolateral ischemia  TRANSTHORACIC ECHOCARDIOGRAM  06/2016  Severe LVH.  Vigorous EF of 65-70%.  No RWMA. ~Only grade 1 diastolic dysfunction.  Mild aortic stenosis (mean gradient 15 mmHg)  TRANSTHORACIC ECHOCARDIOGRAM  07/2012  EF 50-55%; severe concentric LVH; only grade 1 diastolic dysfunction. Mild aortic sclerosis - with LVOT /intracavitary gradient of roughly 20 mmHg mean. Mild to moderately dilated LA;; previously reported MR not seen Dustin Olam Bull 03/13/2024, 6:42 PM  VAS US  CAROTID Result Date: 03/04/2024 Carotid Arterial Duplex Study Patient Name:  Erin Riley  Date of Exam:   03/03/2024 Medical Rec #: [id number]    Accession #:    [id number] Date of Birth: Apr 28, 1950    Patient Gender: F Patient Age:   25 years Exam Location:  [facility] Procedure:      VAS US  CAROTID Referring Phys: MIMI [place] --------------------------------------------------------------------------------  Indications:       CVA. Risk Factors:      Hypertension, hyperlipidemia, current smoker, coronary artery                    disease, PAD. Other Factors:     CKD, CHF, aortic stenosis, thoracoabdominal aortic aneurysm. Comparison Study:  Previous exam was on 12/20/2017 showed bilateral 1-39% ICA                    stenosis. Performing Technologist: Ezzie Potters RVT, RDMS  Examination Guidelines: A complete evaluation includes B-mode imaging, spectral Doppler, color Doppler, and power Doppler as needed of all accessible portions of each vessel. Bilateral testing is considered an  integral part of a complete examination. Limited examinations for reoccurring indications may be performed as noted.  Right Carotid Findings: +----------+--------+--------+--------+------------------+------------------+           PSV cm/sEDV cm/sStenosisPlaque DescriptionComments           +----------+--------+--------+--------+------------------+------------------+ CCA Prox  35      12                                intimal thickening +----------+--------+--------+--------+------------------+------------------+ CCA Distal36      12              heterogenous      intimal thickening +----------+--------+--------+--------+------------------+------------------+ ICA Prox  30      13      1-39%   calcific                             +----------+--------+--------+--------+------------------+------------------+ ICA Distal63      29                                                   +----------+--------+--------+--------+------------------+------------------+ ECA       69      16                                                   +----------+--------+--------+--------+------------------+------------------+ +----------+--------+-------+---------+-------------------+  PSV cm/sEDV cmsDescribe Arm Pressure (mmHG) +----------+--------+-------+---------+-------------------+ Subclavian60             Turbulent                    +----------+--------+-------+---------+-------------------+ +---------+--------+--+--------+--+---------+ VertebralPSV cm/s23EDV cm/s10Antegrade +---------+--------+--+--------+--+---------+  Left Carotid Findings: +----------+--------+--------+--------+-------------------------+--------+           PSV cm/sEDV cm/sStenosisPlaque Description       Comments +----------+--------+--------+--------+-------------------------+--------+ CCA Prox  45      15              heterogenous and smooth            +----------+--------+--------+--------+-------------------------+--------+ CCA Distal51      15              heterogenous and smooth           +----------+--------+--------+--------+-------------------------+--------+ ICA Prox  57      26      1-39%   heterogenous and calcific         +----------+--------+--------+--------+-------------------------+--------+ ICA Distal50      24                                                +----------+--------+--------+--------+-------------------------+--------+ ECA       53      10                                                +----------+--------+--------+--------+-------------------------+--------+ +----------+--------+--------+----------------+-------------------+           PSV cm/sEDV cm/sDescribe        Arm Pressure (mmHG) +----------+--------+--------+----------------+-------------------+ [id number]              Multiphasic, WNL                    +----------+--------+--------+----------------+-------------------+ +---------+--------+--+--------+-+---------+ VertebralPSV cm/s24EDV cm/s7Antegrade +---------+--------+--+--------+-+---------+   Summary: Right Carotid: Velocities in the right ICA are consistent with a 1-39% stenosis. Left Carotid: Velocities in the left ICA are consistent with a 1-39% stenosis. Vertebrals:  Bilateral vertebral arteries demonstrate antegrade flow. Subclavians: Right subclavian artery flow was disturbed. Normal flow              hemodynamics were seen in the left subclavian artery. *See table(s) above for measurements and observations.  Electronically signed by Eather Popp MD on 03/04/2024 at 11:36:12 AM.    Final    DG Swallowing Func-Speech Pathology Result Date: 03/03/2024 Table formatting from the original result was not included. Modified Barium Swallow Study Patient Details Name: Erin Riley MRN: [id number] Date of Birth: [birth year] Today's Date: 03/03/2024 HPI/PMH: HPI: Erin Riley is a  74 y.o. female who presented to the hospital from home on 03/01/24 after being found by her son with left sided weakness, slurred speech and facial droop. She was intubated upon arrival to ER. CXR did not show acute abnormality, CT chest did not show any focal consolidation or PE. 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 She was extubated on 03/02/24 at 1305 to oxygen via [place]. PMH: HTN, severe aortic stenosis, nonischemic cardiomyopathy, CKD stage IIIb, schizophrenia. MBSS completed on 12/29 with recommendation to continue NPO with the exception of puree snacks. Clinical Impression: Pt  presents with a moderate-severe oropharyngeal dysphagia per results of MBSS completed today. Recommend continue NPO with the exception of puree snacks from nursing unit including, applesauce, yogurt, or pudding. Ice chips are not recommended at this time due to oral deficits and aspiration risk.  Oral deficits characterized by poor oral and lingual strength and coordination, which resulted in anterior spillage, poor labial seal, prolonged oral transit, lingual pumping, and significant posterior spillage of liquids into the airway.  Pharyngeal deficits included reduced hyolaryngeal elevation/excursion, mistiming of epiglottic inversion, poor laryngeal vestibule closure (though may have been more of a timing issue), and reduced base of tongue retraction.  Findings: -There was frank aspiration of thin liquids, nectar-thick liquids, and honey-thick liquids before/during the swallow. Aspiration occurrences were inconsistent, though pt did aspirate large volumes when aspiration occurred. -Aspiration appeared to occur as a result of significant oral deficits + lack of vallecular space given that liquid trials spilled nearly directly into the airway before/during the swallow. -Pt exhibited an immediate or delayed cough response to majority of aspiration events with x1 event of  silent aspiration of a small amount of honey-thick liquids. -Presenting smaller or larger volumes of liquids did not appear to reduce aspiration risk.  Plan: SLP will follow for ongoing dysphagia and cognitive/communication therapy. Recommend focusing on oral motor exercises and oral bolus control in effort to reduce pt's aspiration risk with PO intake and possibly advance diet. Pt will need a repeat MBSS prior to diet advancement. SLP can try puree solids and honey-thick liquids by spoon at bedside as these appeared to be the safest consistencies. Factors that may increase risk of adverse event in presence of aspiration Noe & Lianne 2021): Factors that may increase risk of adverse event in presence of aspiration Noe & Lianne 2021): Aspiration of thick, dense, and/or acidic materials; Frequent aspiration of large volumes; Dependence for feeding and/or oral hygiene; Frail or deconditioned Recommendations/Plan: Swallowing Evaluation Recommendations Swallowing Evaluation Recommendations Recommendations: NPO (with exception of puree snacks from nursing floor) Liquid Administration via: Spoon Medication Administration: Crushed with puree Supervision: Full assist for feeding Swallowing strategies  : Slow rate; Small bites/sips; Minimize environmental distractions Postural changes: Stay upright 30-60 min after meals Oral care recommendations: Oral care QID (4x/day) Caregiver Recommendations: Avoid jello, ice cream, thin soups, popsicles; Remove water  pitcher; Have oral suction available Treatment Plan Treatment Plan Treatment recommendations: Therapy as outlined in treatment plan below Follow-up recommendations: Follow physicians's recommendations for discharge plan and follow up therapies Functional status assessment: Patient has had a recent decline in their functional status and demonstrates the ability to make significant improvements in function in a reasonable and predictable amount of time. Treatment  frequency: Min 2x/week Treatment duration: 4 weeks Interventions: Diet toleration management by SLP; Patient/family education; Trials of upgraded texture/liquids Recommendations Recommendations for follow up therapy are one component of a multi-disciplinary discharge planning process, led by the attending physician.  Recommendations may be updated based on patient status, additional functional criteria and insurance authorization. Assessment: Orofacial Exam: Orofacial Exam Oral Cavity - Dentition: Edentulous; Dentures, not available Orofacial Anatomy: -- (see clinical swallow assessmeny) Anatomy: No data recorded Boluses Administered: Boluses Administered Boluses Administered: Thin liquids (Level 0); Mildly thick liquids (Level 2, nectar thick); Moderately thick liquids (Level 3, honey thick); Puree  Oral Impairment Domain: Oral Impairment Domain Lip Closure: Escape progressing to mid-chin Tongue control during bolus hold: Posterior escape of less than half of bolus Bolus preparation/mastication: -- (not tested) Bolus transport/lingual motion: Repetitive/disorganized tongue motion Oral residue: Residue  collection on oral structures Location of oral residue : Floor of mouth; Tongue Initiation of pharyngeal swallow : Valleculae  Pharyngeal Impairment Domain: Pharyngeal Impairment Domain Soft palate elevation: No bolus between soft palate (SP)/pharyngeal wall (PW) Laryngeal elevation: Partial superior movement of thyroid  cartilage/partial approximation of arytenoids to epiglottic petiole Anterior hyoid excursion: Partial anterior movement Epiglottic movement: Complete inversion Laryngeal vestibule closure: None, wide column air/contrast in laryngeal vestibule Pharyngeal stripping wave : Present - diminished Pharyngeal contraction (A/P view only): N/A Pharyngoesophageal segment opening: Complete distension and complete duration, no obstruction of flow Tongue base retraction: Narrow column of contrast or air between  tongue base and PPW Pharyngeal residue: Complete pharyngeal clearance  Esophageal Impairment Domain: Esophageal Impairment Domain Esophageal clearance upright position: -- (not assessed) Pill: No data recorded Penetration/Aspiration Scale Score: Penetration/Aspiration Scale Score 2.  Material enters airway, remains ABOVE vocal cords then ejected out: Puree; Mildly thick liquids (Level 2, nectar thick); Moderately thick liquids (Level 3, honey thick) 3.  Material enters airway, remains ABOVE vocal cords and not ejected out: Puree 5.  Material enters airway, CONTACTS cords and not ejected out: Moderately thick liquids (Level 3, honey thick) 7.  Material enters airway, passes BELOW cords and not ejected out despite cough attempt by patient: Thin liquids (Level 0); Mildly thick liquids (Level 2, nectar thick); Moderately thick liquids (Level 3, honey thick) 8.  Material enters airway, passes BELOW cords without attempt by patient to eject out (silent aspiration) : Moderately thick liquids (Level 3, honey thick) Compensatory Strategies: Compensatory Strategies Compensatory strategies: -- (unable to follow directions to attempt strategies)   General Information: Caregiver present: No  Diet Prior to this Study: NPO   Temperature : Febrile   No data recorded  Supplemental O2: Nasal cannula (1L)   History of Recent Intubation: Yes  Behavior/Cognition: Alert; Cooperative; Requires cueing Self-Feeding Abilities: Dependent for feeding No data recorded Volitional Cough: Unable to elicit Volitional Swallow: Unable to elicit Exam Limitations: Excessive movement Goal Planning: Prognosis for improved oropharyngeal function: Fair Barriers to Reach Goals: Severity of deficits; Cognitive deficits No data recorded Patient/Family Stated Goal: asking for water , ginger ale Consulted and agree with results and recommendations: Patient; Nurse Pain: Pain Assessment Pain Assessment: No/denies pain Facial Expression: 0 Body Movements: 0 Muscle  Tension: 0 Compliance with ventilator (intubated pts.): 0 Vocalization (extubated pts.): 0 CPOT Total: 0 End of Session: Start Time:SLP Start Time (ACUTE ONLY): 1035 Stop Time: SLP Stop Time (ACUTE ONLY): 1055 Time Calculation:SLP Time Calculation (min) (ACUTE ONLY): 20 min Charges: SLP Evaluations $ SLP Speech Visit: 1 Visit SLP Evaluations $BSS Swallow: 1 Procedure $MBS Swallow: 1 Procedure $ SLP EVAL LANGUAGE/SOUND PRODUCTION: 1 Procedure SLP visit diagnosis: SLP Visit Diagnosis: Dysarthria and anarthria (R47.1); Cognitive communication deficit (R41.841); Aphasia (R47.01) Past Medical History: Past Medical History: Diagnosis Date  Chronic back pain   Chronic kidney disease (CKD), stage II (mild)   Class I-II  Coronary artery disease 04/2009  50% stenosis in the perforator of LAD; catheterization was for an abnormal Myoview in January 2000 showing anterior and inferolateral ischemia.  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 > 3 subsequent Echos show improved/normal EF with moderate to severe concentric LVH and diastolic dysfunction with LVOT/intracavitary gradient --> 06/2016: Severe LVH.  Vigorous EF, 65-70%.?? Gr 1 DD. Mild AS.  Hyperlipidemia   Hypertension   Hypertensive hypertrophic cardiomyopathy: NYHA class II:  Echo: Severe concentric  LVH with LV OT gradient; essentially preserved EF with diastolic dysfunction 02/15/2013  Echo 06/2016: Severe Concentric LVH. Vigorous EF 65-70%. ~ Gr I DD.   Mild aortic stenosis by prior echocardiography   Echo 06/2016: Mild AS (Mean Gradient 15 mmHg); has had prior Mod-Severe MR (not seen on current echo)  PAD (peripheral artery disease) March 2013  Lower extremity Dopplers: R. SFA 50-60%, R. PTA proximally occluded with distal reconstitution;; L. common iliac ~50%, L. SFA 50-70% stenosis, L. PTA < 50%  Schizophrenia (HCC)  Past Surgical History: Past Surgical  History: Procedure Laterality Date  BUNIONECTOMY    carotid doppler  05/29/2011  left bulb/prox ICA moderate amtfibrous plaque with no evidence significant reduction.,right bulb /proximal ICA normal patency  lower extremity doppler  05/29/2011  right SFA 50% to 59% diameter reduction,right posterior tibal atreery occlusive disease,reconstituting distally, left common illiac<50%,left SFA 50 to70%,left post. tibial <50%  NM MYOCAR PERF WALL MOTION  03/2009  Persantine; EF 51%-both anterior and inferolateral ischemia  TRANSTHORACIC ECHOCARDIOGRAM  06/2016  Severe LVH.  Vigorous EF of 65-70%.  No RWMA. ~Only grade 1 diastolic dysfunction.  Mild aortic stenosis (mean gradient 15 mmHg)  TRANSTHORACIC ECHOCARDIOGRAM  07/2012  EF 50-55%; severe concentric LVH; only grade 1 diastolic dysfunction. Mild aortic sclerosis - with LVOT /intracavitary gradient of roughly 20 mmHg mean. Mild to moderately dilated LA;; previously reported MR not seen Erin Riley 03/03/2024, 12:50 PM  ECHOCARDIOGRAM COMPLETE Result Date: 03/02/2024    ECHOCARDIOGRAM REPORT   Patient Name:   Erin Riley Date of Exam: 03/02/2024 Medical Rec #:  [id number]   Height:       67.0 in Accession #:    [id number]  Weight:       214.5 lb Date of Birth:  [birth year]   BSA:          2.083 m Patient Age:    73 years    BP:           131/93 mmHg Patient Gender: F           HR:           100 bpm. Exam Location:  Inpatient Procedure: 2D Echo, Cardiac Doppler, Color Doppler and Intracardiac            Opacification Agent (Both Spectral and Color Flow Doppler were            utilized during procedure). Indications:    Stroke 163.9  History:        Patient has prior history of Echocardiogram examinations, most                 recent 06/13/2023. CHF, CAD, Stroke and PAD; Risk                 Factors:Dyslipidemia, Hypertension and Current Smoker.  Sonographer:    Merlynn Argyle Referring Phys: [id number] [facility]  Sonographer Comments: Technically difficult study due  to poor echo windows and suboptimal subcostal window. Image acquisition challenging due to uncooperative patient and Image acquisition challenging due to respiratory motion. IMPRESSIONS  1. Left ventricular ejection fraction, by estimation, is 25 to 30%. The left ventricle has severely decreased function. The left ventricle demonstrates global hypokinesis. The left ventricular internal cavity size was mildly dilated. There is severe concentric left ventricular hypertrophy. Left ventricular diastolic parameters are indeterminate.  2. Right ventricular systolic function is normal. The right ventricular size is normal.  3. Left atrial size was severely dilated.  4.  Right atrial size was moderately dilated.  5. The mitral valve is normal in structure. No evidence of mitral valve regurgitation. No evidence of mitral stenosis.  6. The aortic valve is calcified. There is moderate calcification of the aortic valve. There is moderate thickening of the aortic valve. Aortic valve regurgitation is trivial. Severe aortic valve stenosis. Aortic valve area, by VTI measures 0.81 cm. Aortic valve mean gradient measures 33.0 mmHg. Aortic valve Vmax measures 3.82 m/s.  7. Aortic dilatation noted. There is mild dilatation of the ascending aorta, measuring 41 mm.  8. The inferior vena cava is normal in size with greater than 50% respiratory variability, suggesting right atrial pressure of 3 mmHg. Conclusion(s)/Recommendation(s): No intracardiac source of embolism detected on this transthoracic study. Consider a transesophageal echocardiogram to exclude cardiac source of embolism if clinically indicated. FINDINGS  Left Ventricle: Left ventricular ejection fraction, by estimation, is 25 to 30%. The left ventricle has severely decreased function. The left ventricle demonstrates global hypokinesis. Definity  contrast agent was given IV to delineate the left ventricular endocardial borders. The left ventricular internal cavity size was  mildly dilated. There is severe concentric left ventricular hypertrophy. Left ventricular diastolic parameters are indeterminate. Right Ventricle: The right ventricular size is normal. No increase in right ventricular wall thickness. Right ventricular systolic function is normal. Left Atrium: Left atrial size was severely dilated. Right Atrium: Right atrial size was moderately dilated. Pericardium: There is no evidence of pericardial effusion. Mitral Valve: The mitral valve is normal in structure. No evidence of mitral valve regurgitation. No evidence of mitral valve stenosis. Tricuspid Valve: The tricuspid valve is normal in structure. Tricuspid valve regurgitation is not demonstrated. No evidence of tricuspid stenosis. Aortic Valve: The aortic valve is calcified. There is moderate calcification of the aortic valve. There is moderate thickening of the aortic valve. Aortic valve regurgitation is trivial. Aortic regurgitation PHT measures 425 msec. Severe aortic stenosis is present. Aortic valve mean gradient measures 33.0 mmHg. Aortic valve peak gradient measures 58.2 mmHg. Aortic valve area, by VTI measures 0.81 cm. Pulmonic Valve: The pulmonic valve was normal in structure. Pulmonic valve regurgitation is not visualized. No evidence of pulmonic stenosis. Aorta: Aortic dilatation noted. There is mild dilatation of the ascending aorta, measuring 41 mm. Venous: The inferior vena cava is normal in size with greater than 50% respiratory variability, suggesting right atrial pressure of 3 mmHg. IAS/Shunts: No atrial level shunt detected by color flow Doppler.  LEFT VENTRICLE PLAX 2D LVIDd:         6.80 cm   Diastology LVIDs:         6.10 cm   LV e' lateral:   7.77 cm/s LV PW:         1.30 cm   LV E/e' lateral: 14.8 LV IVS:        1.20 cm LVOT diam:     2.00 cm LV SV:         54 LV SV Index:   26 LVOT Area:     3.14 cm  RIGHT VENTRICLE             IVC RV S prime:     16.20 cm/s  IVC diam: 2.55 cm TAPSE (M-mode): 2.8 cm  LEFT ATRIUM             Index        RIGHT ATRIUM           Index LA diam:        6.10  cm 2.93 cm/m   RA Area:     12.80 cm LA Vol (A2C):   72.8 ml 34.94 ml/m  RA Volume:   29.60 ml  14.21 ml/m LA Vol (A4C):   40.3 ml 19.34 ml/m LA Biplane Vol: 54.5 ml 26.16 ml/m  AORTIC VALVE                     PULMONIC VALVE AV Area (Vmax):    0.78 cm      PR End Diast Vel: 9.86 msec AV Area (Vmean):   0.78 cm AV Area (VTI):     0.81 cm AV Vmax:           381.50 cm/s AV Vmean:          271.500 cm/s AV VTI:            0.666 m AV Peak Grad:      58.2 mmHg AV Mean Grad:      33.0 mmHg LVOT Vmax:         95.03 cm/s LVOT Vmean:        67.533 cm/s LVOT VTI:          0.171 m LVOT/AV VTI ratio: 0.26 AI PHT:            425 msec  AORTA Ao Root diam: 3.30 cm Ao Asc diam:  4.10 cm MITRAL VALVE                TRICUSPID VALVE MV Area (PHT): 3.63 cm     TR Peak grad:   39.7 mmHg MV Decel Time: 209 msec     TR Vmax:        315.00 cm/s MR Peak grad: 109.4 mmHg MR Mean grad: 72.0 mmHg     SHUNTS MR Vmax:      523.00 cm/s   Systemic VTI:  0.17 m MR Vmean:     395.0 cm/s    Systemic Diam: 2.00 cm MV E velocity: 115.00 cm/s MV A velocity: 27.60 cm/s MV E/A ratio:  4.17 Oneil Parchment MD Electronically signed by Oneil Parchment MD Signature Date/Time: 03/02/2024/1:40:38 PM    Final    MR ANGIO HEAD WO CONTRAST Result Date: 03/02/2024 EXAM: MR Angiography Head without intravenous contrast. 03/02/2024 12:18:48 AM TECHNIQUE: Magnetic resonance angiography images of the head without intravenous contrast. Multiplanar 2D and 3D reformatted images are provided for review. COMPARISON: None provided. CLINICAL HISTORY: Stroke/TIA, determine embolic source Stroke/TIA, determine embolic source FINDINGS: ANTERIOR CIRCULATION: No significant stenosis of the internal carotid arteries. No significant stenosis of the anterior cerebral arteries. Diminished right M1 MCA low related signal with occluded proximal M2 MCA branch. Reserved for correlate signal in an  anterior M2 MCA branch. No visible aneurysm. POSTERIOR CIRCULATION: Right PCA is patent. Severe stenosis versus occlusion of the left P2 PCA with poor distal flow related signal. The PCAs are not well assessed on this study. No significant stenosis of the basilar artery. Vertebral arteries and proximal basilar artery not well evaluated due to artifact, but appear patent. Other: Findings will be conveyed to the clinical team by the radiology assistant and documented in Clario/PACs. IMPRESSION: 1. Occluded proximal right M2 MCA branch. 2. Severe stenosis versus occlusion of the left P2 PCA, which is not well evaluated on this study. A CTA could better characterize if clinically warranted. Electronically signed by: Gilmore Molt 03/02/2024 01:21 AM EST RP Workstation: [id number]   MR BRAIN WO CONTRAST Result Date: 03/02/2024 EXAM: MRI Brain Without  Contrast 03/02/2024 12:18:48 AM TECHNIQUE: Multiplanar multisequence MRI of the head/brain was performed without the administration of intravenous contrast. COMPARISON: CT head 03/01/24. CLINICAL HISTORY: Stroke, follow up FINDINGS: BRAIN AND VENTRICLES: Acute right MCA territory infarct involving the posterior right insula, posterior right frontal lobe, parietal lobe and anterior temporal lobe. Associated edema and mild mass effect with trace (2 mm) leftward midline shift. No intracranial hemorrhage. No mass. No hydrocephalus. The sella is unremarkable. Normal flow voids. ORBITS: No acute abnormality. SINUSES AND MASTOIDS: No acute abnormality. BONES AND SOFT TISSUES: Normal marrow signal. No acute soft tissue abnormality. IMPRESSION: 1. Acute right MCA territory infarct. Trace (2 mm) leftward midline shift. Electronically signed by: Gilmore Molt 03/02/2024 01:06 AM EST RP Workstation: [id number]   CT CHEST ABDOMEN PELVIS WO CONTRAST Result Date: 03/01/2024 EXAM: CT CHEST, ABDOMEN AND PELVIS WITHOUT CONTRAST 03/01/2024 05:18:10 PM TECHNIQUE: CT of the chest,  abdomen and pelvis was performed without the administration of intravenous contrast. Multiplanar reformatted images are provided for review. Automated exposure control, iterative reconstruction, and/or weight based adjustment of the mA/kV was utilized to reduce the radiation dose to as low as reasonably achievable. COMPARISON: Poor evaluation of interval increase in size of the thoracic, abdominal suprarenal, and infrarenal aorta aneurysms compared to 2024. Please see separately dictated CT angiography chest abdomen and pelvis 03/01/2024 for further details. CLINICAL HISTORY: Polytrauma, blunt. FINDINGS: CHEST: MEDIASTINUM AND LYMPH NODES: Enlarged left ventricle. Severe atherosclerotic plaque. The central airways are clear. No gross hilar adenopathy with limited evaluation on this noncontrast study. No mediastinal or axillary lymphadenopathy. Subcentimeter thyroid  hypodense nodules. LUNGS AND PLEURA: Right lower lobe airspace opacity better evaluated on CT angiography of the chest. No pulmonary edema. No pleural effusion or pneumothorax. ABDOMEN AND PELVIS: LIVER: The liver is unremarkable. GALLBLADDER AND BILE DUCTS: Gallbladder is unremarkable. No biliary ductal dilatation. SPLEEN: No acute abnormality. PANCREAS: No acute abnormality. ADRENAL GLANDS: No acute abnormality. KIDNEYS, URETERS AND BLADDER: Foley catheter terminates within the urinary bladder lumen. No stones in the kidneys or ureters. No hydronephrosis. No perinephric or periureteral stranding. GI AND BOWEL: Stomach demonstrates no acute abnormality. Colonic diverticulosis. No small or large bowel thickening or dilatation. The appendix is unremarkable. There is no bowel obstruction. REPRODUCTIVE ORGANS: The uterus is unremarkable. No left adnexal mass. Elongated 4 x 2.3 cm simple fluid cystic lesion along the right adnexa. PERITONEUM AND RETROPERITONEUM: No ascites. No free air. VASCULATURE: Poor evaluation of interval increase in size of the  thoracic, abdominal suprarenal, and infrarenal aorta aneurysms compared to 2024. Please see separately dictated CT angiography chest abdomen and pelvis 03/01/2024 for further details. ABDOMINAL AND PELVIS LYMPH NODES: No lymphadenopathy. BONES AND SOFT TISSUES: Endotracheal and enteric tubes in appropriate position. Degenerative changes of the spine. L4-S1 surgical hardware. Lower lumbar spine surgical hardware. Total right shoulder arthroplasty partially visualized. No acute fracture. No focal soft tissue abnormality. Persistent right breast 2.1 cm fluid density lesion. IMPRESSION: 1. Poor evaluation of interval increase in size of the thoracic, abdominal suprarenal, and infrarenal aortic aneurysms compared to 2024; please see the separately dictated CT angiography chest, abdomen, and pelvis for further details. 2. Elongated 4.0 x 2.3 cm simple-appearing right adnexal cystic lesion - recommend outpt 6-12 weeks follow-up. 3. Severe atherosclerotic plaque. Electronically signed by: Morgane Naveau MD 03/01/2024 06:07 PM EST RP Workstation: [id number]   CT T-SPINE NO CHARGE Result Date: 03/01/2024 EXAM: CT THORACIC SPINE WITHOUT CONTRAST 03/01/2024 05:18:10 PM TECHNIQUE: CT of the thoracic spine was performed without the administration of intravenous  contrast. Multiplanar reformatted images are provided for review. Automated exposure control, iterative reconstruction, and/or weight based adjustment of the mA/kV was utilized to reduce the radiation dose to as low as reasonably achievable. COMPARISON: None available. CLINICAL HISTORY: FINDINGS: BONES AND ALIGNMENT: Normal vertebral body heights. No acute fracture or suspicious bone lesion. Normal alignment. DEGENERATIVE CHANGES: Bilateral mild-to-moderate degenerative changes of the spine. Multilevel mild intervertebral disc space narrowing. SOFT TISSUES: No acute abnormality. IMPRESSION: 1. No acute abnormality of the thoracic spine. 2. Please see separately  dictated CT angiography chest abdomen and pelvis. 12:27:25. Electronically signed by: Morgane Naveau MD 03/01/2024 05:38 PM EST RP Workstation: [id number]   CT L-SPINE NO CHARGE Result Date: 03/01/2024 EXAM: CT OF THE LUMBAR SPINE WITHOUT CONTRAST 03/01/2024 05:18:10 PM TECHNIQUE: CT of the lumbar spine was performed without the administration of intravenous contrast. Multiplanar reformatted images are provided for review. Automated exposure control, iterative reconstruction, and/or weight based adjustment of the mA/kV was utilized to reduce the radiation dose to as low as reasonably achievable. COMPARISON: Please see separately dictated CT angiography chest abdomen and pelvis. 03/01/2024. CLINICAL HISTORY: FINDINGS: BONES AND ALIGNMENT: L4-S1 posterolateral surgical hardware. Normal vertebral body heights. No acute fracture or suspicious bone lesion. Normal alignment. DEGENERATIVE CHANGES: Multilevel moderate degenerative spine. Intervertebral disc space vacuum phenomenon. SOFT TISSUES: No acute abnormality. IMPRESSION: 1. No acute findings. 2. Please see separately dictated CT angiography chest abdomen and pelvis. 12:27:25. Electronically signed by: Morgane Naveau MD 03/01/2024 05:37 PM EST RP Workstation: [id number]   CT CERVICAL SPINE WO CONTRAST Result Date: 03/01/2024 EXAM: CT CERVICAL SPINE WITHOUT CONTRAST 03/01/2024 05:18:10 PM TECHNIQUE: CT of the cervical spine was performed without the administration of intravenous contrast. Multiplanar reformatted images are provided for review. Automated exposure control, iterative reconstruction, and/or weight based adjustment of the mA/kV was utilized to reduce the radiation dose to as low as reasonably achievable. COMPARISON: None available. CLINICAL HISTORY: Polytrauma, blunt. FINDINGS: BONES AND ALIGNMENT: No acute fracture or traumatic malalignment. DEGENERATIVE CHANGES: Multilevel moderate degenerative change of the spine. No associated severe osseous  neural foraminal or central canal stenosis. SOFT TISSUES: No prevertebral soft tissue swelling. Endotracheal and enteric tubes are partially visualized. Atherosclerotic plaque. Probably secretions noted within the oropharynx superior to the endotracheal balloon. Bilateral thyroid  gland nodules measuring 1 cm or less. VISUALIZED LUNG APICES: Biapical subcentimeter cystic changes. IMPRESSION: 1. No evidence of acute traumatic injury. Electronically signed by: Morgane Naveau MD 03/01/2024 05:32 PM EST RP Workstation: [id number]   CT HEAD WO CONTRAST Result Date: 03/01/2024 EXAM: CT HEAD WITHOUT CONTRAST 03/01/2024 05:18:10 PM TECHNIQUE: CT of the head was performed without the administration of intravenous contrast. Automated exposure control, iterative reconstruction, and/or weight based adjustment of the mA/kV was utilized to reduce the radiation dose to as low as reasonably achievable. COMPARISON: None available. CLINICAL HISTORY: Head trauma, moderate-severe FINDINGS: BRAIN AND VENTRICLES: Large right MCA territory infarction involving the temporal, occipital, and parietal lobes with loss of grey-white matter differentiation, associated edema, mass effect with sulcal effacement and partial effacement of the right lateral ventricle. No acute hemorrhage. No hydrocephalus. No extra-axial collection. Calcific atherosclerosis. ORBITS: Remote right medial orbital wall fracture. SINUSES: No acute abnormality. SOFT TISSUES AND SKULL: Endotracheal and orogastric tubes in place. No skull fracture. IMPRESSION: 1. Acute to subacute large right MCA territory infarction. 2. These findings were discussed with Dr. Guillermina by Dr. Margarite on 03/01/24 at 5:04 pm Electronically signed by: Morgane Naveau MD 03/01/2024 05:28 PM EST RP Workstation: [id number]   DG Wrist  Complete Left Result Date: 03/01/2024 EXAM: 3 OR MORE VIEW(S) XRAY OF THE LEFT WRIST 03/01/2024 04:42:00 PM COMPARISON: None available. CLINICAL HISTORY: trauma  FINDINGS: BONES AND JOINTS: No acute fracture. No malalignment. Moderate first carpometacarpal degenerative change. SOFT TISSUES: Rounded soft tissue calcification in volar aspect of forearm, nonspecific. Mild soft tissue edema. No retained radiopaque foreign body. IMPRESSION: 1. No acute fracture or dislocation. Electronically signed by: Morgane Naveau MD 03/01/2024 04:54 PM EST RP Workstation: [id number]   CT Angio Chest/Abd/Pel for Dissection W and/or Wo Contrast Result Date: 03/01/2024 EXAM: CTA CHEST, ABDOMEN AND PELVIS WITH AND WITHOUT CONTRAST 03/01/2024 04:22:15 PM TECHNIQUE: CTA of the chest was performed with and without the administration of intravenous contrast. CTA of the abdomen and pelvis was performed with and without the administration of intravenous contrast. Multiplanar reformatted images are provided for review. MIP images are provided for review. Automated exposure control, iterative reconstruction, and/or weight based adjustment of the mA/kV was utilized to reduce the radiation dose to as low as reasonably achievable. COMPARISON: CTA chest 09/09/2022. CLINICAL HISTORY: Acute aortic syndrome (AAS) suspected. FINDINGS: VASCULATURE: AORTA: The ascending thoracic aorta is stable in size and aneurysmal, measuring up to 4 cm. Worsening of descending thoracic aorta aneurysm measuring up to 7.3 x 5 cm from 5.5 x 4.1 cm in 2024 (5:64) with question of associated penetrating atheromatous ulceration versus saccular aneurysm at this level. Associated interval increase in size of a suprarenal and infrarenal abdominal aorta aneurysm measuring up to 6.2 x 6 cm from 5.1 x 4.2 cm and 4.6 x 4 cm from 4.4 x 3.8 cm. Aneurysm extends down to the aortic bifurcation where it measures 3 cm. The majority of the aneurysm is thrombosed. No dissection. No aortic rupture. No extravasation of contrast. No fistulous tract. No fat stranding along the aortic aneurysm. Aortic valve leaflet calcification. At least moderate  atherosclerotic plaque of the aorta. PULMONARY ARTERIES: No pulmonary embolism with the limits of this exam. GREAT VESSELS OF AORTIC ARCH: No acute finding. No dissection. No arterial occlusion or significant stenosis. 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 Mild atherosclerotic plaque. SUPERIOR MESENTERIC ARTERY: Mild atherosclerotic plaque. No occlusion or significant stenosis. INFERIOR MESENTERIC ARTERY: Diminutive inferior mesenteric artery. RENAL ARTERIES: At least moderate atherosclerotic plaque of the renal arteries with stenosis due to atherosclerotic plaque. ILIAC ARTERIES: Atherosclerotic plaque. No acute finding. No occlusion or significant stenosis. CHEST: MEDIASTINUM: No mediastinal lymphadenopathy. The heart and pericardium demonstrate no acute abnormality. Aortic valve leaflet calcification. LUNGS AND PLEURA: Endotracheal tube with tip terminating 2 cm above the carina. T bibasilar atelectasis. No focal consolidation or pulmonary edema. No evidence of pleural effusion or pneumothorax. THORACIC BONES AND SOFT TISSUES: No acute bone or soft tissue abnormality. ABDOMEN AND PELVIS: LIVER: The liver is unremarkable. GALLBLADDER AND BILE DUCTS: Gallbladder is unremarkable. No biliary ductal dilatation. SPLEEN: The spleen is unremarkable. PANCREAS: The pancreas is unremarkable. ADRENAL GLANDS: Hyperplastic bilateral adrenal glands with no nodularity. KIDNEYS, URETERS AND BLADDER: No stones in the kidneys or ureters. No hydronephrosis. No perinephric or periureteral stranding. Urinary bladder is unremarkable. Foley catheter terminates within the urinary bladder lumen. GI AND BOWEL: Enteric tube with tip in satisfactory position within the gastric lumen. Stomach and duodenal sweep demonstrate no acute abnormality. There is no bowel obstruction. No abnormal bowel wall thickening or distension. Colonic diverticulosis. REPRODUCTIVE: The uterus is unremarkable. No left adnexal  mass. Elongated 4 x 2.3 cm simple fluid cystic lesion along the right adnexa. PERITONEUM  AND RETROPERITONEUM: No ascites or free air. LYMPH NODES: No lymphadenopathy. ABDOMINAL BONES AND SOFT TISSUES: Degenerative changes of the spine. L4-S1 surgical hardware. Right shoulder arthroplasty partially visualized. No acute fracture. No acute soft tissue abnormality. IMPRESSION: 1. Recommend emergent vascular consultation give increased size of aneurysm. No thoracic or abdominal aortic dissection, rupture, contrast extravasation, or fistulous tract is identified. 2. 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 3. Interval enlargement of the suprarenal and infrarenal abdominal aortic aneurysms to 6.2 x 6.0 cm (previously 5.1 x 4.2 cm) and 4.6 x 4.0 cm (previously 4.4 x 3.8 cm), extending to the aortic bifurcation (3.0 cm). 4. 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 5. 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 6. Aortic valve leaflet calcification - correlate for aortic stenosis. 7. Right adnexal simple-appearing cyst measuring 4.0 x 2.3 cm, with follow-up ultrasound in 6-12 months recommended. 8. Other, non-acute and/or normal findings as above. 9. These findings were discussed with doctor Guillermina over the phone on 03/01/2024 at 4:27 pm by doctor Naveau. Electronically signed by: Morgane Naveau MD 03/01/2024 04:47 PM EST RP Workstation: [id number]   DG Forearm Left Result Date: 03/01/2024 EXAM: 1 VIEW(S) XRAY OF THE LEFT FOREARM 03/01/2024 03:54:07 PM COMPARISON: None available. CLINICAL HISTORY: trauma FINDINGS: FINDINGS: BONES AND JOINTS: No acute fracture. No malalignment. SOFT TISSUES: Small calcific density along the radial soft tissues of the mid forearm. IMPRESSION: 1. No acute fracture or dislocation. Electronically signed by: Morgane Naveau MD  03/01/2024 04:07 PM EST RP Workstation: [id number]   DG Hand Complete Left Result Date: 03/01/2024 EXAM: 3 or more VIEW(S) XRAY OF THE LEFT HAND 03/01/2024 03:54:07 PM COMPARISON: XR left hand 12/31/2013. CLINICAL HISTORY: trauma FINDINGS: BONES AND JOINTS: No acute fracture. No malalignment. Degenerative changes of the first [facility] joint. SOFT TISSUES: Soft tissue edema of the wrist. No retained radioopaque foreign body. IMPRESSION: 1. No acute fracture or dislocation. 2. Soft tissue edema of the wrist without retained radiopaque foreign body. Electronically signed by: Morgane Naveau MD 03/01/2024 04:06 PM EST RP Workstation: [id number]   DG Abdomen 1 View Result Date: 03/01/2024 EXAM: 1 VIEW XRAY OF THE ABDOMEN 03/01/2024 03:35:54 PM COMPARISON: None available. CLINICAL HISTORY: og FINDINGS: LINES, TUBES AND DEVICES: Enteric tube in place with tip and side port projecting over the stomach. BOWEL: Nonobstructive bowel gas pattern. SOFT TISSUES: No abnormal calcifications. BONES: Degenerative changes of the thoracolumbar spine. No acute fracture. IMPRESSION: 1. Enteric tube in place with tip and side port projecting over the stomach. 2. No acute findings. Electronically signed by: Morgane Naveau MD 03/01/2024 03:52 PM EST RP Workstation: [id number]   DG Pelvis Portable Result Date: 03/01/2024 EXAM: 1 OR 2 VIEW(S) XRAY OF THE PELVIS 03/01/2024 03:35:54 PM COMPARISON: None available. CLINICAL HISTORY: Trauma FINDINGS: BONES AND JOINTS: Lumbar fusion hardware in place. Bilateral hip degenerative changes with joint space narrowing and osteophytes. No acute fracture. No malalignment. SOFT TISSUES: Vascular calcifications. IMPRESSION: 1. No evidence of acute traumatic injury. Electronically signed by: Morgane Naveau MD 03/01/2024 03:51 PM EST RP Workstation: [id number]   DG Chest Port 1 View Result Date: 03/01/2024 EXAM: 1 VIEW(S) XRAY OF THE CHEST 03/01/2024 03:35:54 PM COMPARISON: Chest x-ray 06/12/2023, CT  chest 09/09/2022. CLINICAL HISTORY: Trauma. FINDINGS: LINES, TUBES AND DEVICES: Endotracheal tube terminates 3 to 4 cm above the carina. Internal jugular catheter with tip in situ. Cord overlying the  expected region of the gastric lumen. LUNGS AND PLEURA: No focal pulmonary opacity. No pleural effusion. No pneumothorax. HEART AND MEDIASTINUM: Gross cardiomegaly. Atherosclerotic plaque. BONES AND SOFT TISSUES: Right shoulder arthroplasty noted. IMPRESSION: 1. No acute cardiopulmonary abnormality. 2. Lines and tubes as above. Electronically signed by: Morgane Naveau MD 03/01/2024 03:49 PM EST RP Workstation: [id number]      Subjective: Patient seen and examined at bedside today.  Hemodynamically stable.  Lying on bed.  Remains confused.  Has left hemiplegia.  Overall comfortable.  Obeys commands.  Has a bed at SNF today.  Medically stable for discharge  Discharge Exam: Vitals:   03/21/24 0535 03/21/24 1021  BP: 118/86 110/70  Pulse: 78 68  Resp: 20 18  Temp: 98.6 F (37 C) 98.1 F (36.7 C)  SpO2: 97% 99%   Vitals:   03/20/24 2308 03/21/24 0458 03/21/24 0535 03/21/24 1021  BP: (!) 145/87  118/86 110/70  Pulse: 71  78 68  Resp: 20  20 18   Temp: 98.5 F (36.9 C)  98.6 F (37 C) 98.1 F (36.7 C)  TempSrc: Oral  Oral Oral  SpO2: 92%  97% 99%  Weight:  92.7 kg    Height:        General: Pt is alert, awake, not in acute distress, obese Cardiovascular: RRR, S1/S2 +, no rubs, no gallops Respiratory: CTA bilaterally, no wheezing, no rhonchi Abdominal: Soft, NT, ND, bowel sounds + Extremities: no edema, no cyanosis, left hemiparesis    The results of significant diagnostics from this hospitalization (including imaging, microbiology, ancillary and laboratory) are listed below for reference.     Microbiology: No results found for this or any previous visit (from the past 240 hours).   Labs: BNP (last 3 results) Recent Labs    08/02/23 0919 08/20/23 1427 11/13/23 1448  BNP  950.5* 617.3* 1,625.5*   Basic Metabolic Panel: Recent Labs  Lab 03/15/24 0312 03/16/24 0921 03/17/24 0511 03/18/24 0538 03/21/24 0509  NA 136 138 139 140 144  K 4.7 4.5 4.6 4.2 3.8  CL 103 104 105 105 109  CO2 24 27 26 26 27   GLUCOSE 119* 126* 97 97 108*  BUN 35* 34* 32* 35* 28*  CREATININE 1.05* 1.05* 1.00 1.04* 0.98  CALCIUM  9.6 10.0 9.9 10.4* 10.2  MG 2.2 2.1 2.1 2.3  --   PHOS 3.2 2.9 3.0 3.2  --    Liver Function Tests: Recent Labs  Lab 03/15/24 0312 03/16/24 0921 03/17/24 0511 03/18/24 0538  AST 53* 43* 39 39  ALT 56* 46* 38 35  ALKPHOS 126 124 123 125  BILITOT 0.4 0.4 0.5 0.7  PROT 7.2 7.3 7.4 7.7  ALBUMIN  2.9* 3.0* 3.0* 3.2*   No results for input(s): LIPASE, AMYLASE in the last 168 hours. No results for input(s): AMMONIA in the last 168 hours. CBC: Recent Labs  Lab 03/15/24 0312 03/16/24 0921 03/17/24 0511 03/18/24 0538 03/21/24 0509  WBC 9.4 8.6 8.2 7.5 6.2  NEUTROABS 6.9 6.5 6.0 5.4  --   HGB 9.7* 9.8* 10.0* 10.2* 10.3*  HCT 31.0* 31.7* 32.1* 32.3* 33.4*  MCV 90.4 90.6 90.7 90.0 90.8  PLT 547* 514* 560* 581* 540*   Cardiac Enzymes: No results for input(s): CKTOTAL, CKMB, CKMBINDEX, TROPONINI in the last 168 hours. BNP: Invalid input(s): POCBNP CBG: Recent Labs  Lab 03/20/24 1202 03/20/24 1547 03/20/24 2002 03/21/24 0728 03/21/24 1227  GLUCAP 115* 138* 114* 101* 144*   D-Dimer No results for input(s): DDIMER in the last  72 hours. Hgb A1c No results for input(s): HGBA1C in the last 72 hours. Lipid Profile No results for input(s): CHOL, HDL, LDLCALC, TRIG, CHOLHDL, LDLDIRECT in the last 72 hours. Thyroid  function studies No results for input(s): TSH, T4TOTAL, T3FREE, THYROIDAB in the last 72 hours.  Invalid input(s): FREET3 Anemia work up No results for input(s): VITAMINB12, FOLATE, FERRITIN, TIBC, IRON , RETICCTPCT in the last 72 hours. Urinalysis    Component Value Date/Time    COLORURINE YELLOW 09/09/2022 1449   APPEARANCEUR HAZY (A) 09/09/2022 1449   APPEARANCEUR Clear 08/31/2017 1043   LABSPEC 1.020 09/09/2022 1449   PHURINE 5.0 09/09/2022 1449   GLUCOSEU NEGATIVE 09/09/2022 1449   HGBUR NEGATIVE 09/09/2022 1449   BILIRUBINUR NEGATIVE 09/09/2022 1449   BILIRUBINUR Negative 08/31/2017 1043   KETONESUR NEGATIVE 09/09/2022 1449   PROTEINUR 100 (A) 09/09/2022 1449   UROBILINOGEN 0.2 01/15/2017 0812   NITRITE NEGATIVE 09/09/2022 1449   LEUKOCYTESUR NEGATIVE 09/09/2022 1449   Sepsis Labs Recent Labs  Lab 03/16/24 0921 03/17/24 0511 03/18/24 0538 03/21/24 0509  WBC 8.6 8.2 7.5 6.2   Microbiology No results found for this or any previous visit (from the past 240 hours).  Please note: You were cared for by a hospitalist during your hospital stay. Once you are discharged, your primary care physician will handle any further medical issues. Please note that NO REFILLS for any discharge medications will be authorized once you are discharged, as it is imperative that you return to your primary care physician (or establish a relationship with a primary care physician if you do not have one) for your post hospital discharge needs so that they can reassess your need for medications and monitor your lab values.    Time coordinating discharge: 40 minutes  SIGNED:   Ivonne Mustache, MD  [facility] 03/21/2024, 12:47 PM Pager [phone]  If 7PM-7AM, please contact night-coverage [email or web address] Password [email or web address]     [1] No Known Allergies

## 2024-03-21 NOTE — Procedures (Signed)
 Modified Barium Swallow Study  Patient Details  Name: Erin Riley MRN: [id number] Date of Birth: 09-25-1950  Today's Date: 03/21/2024  Modified Barium Swallow completed.  Full report located under Chart Review in the Imaging Section.  History of Present Illness Erin Riley is a 73 y.o. female who presented to the hospital from home on 03/01/24 after being found by her son with left sided weakness, slurred speech and facial droop. She was intubated upon arrival to ER. CXR did not show acute abnormality, CT chest did not show any focal consolidation or PE. 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 She was extubated on 03/02/24 at 1305 to oxygen via [place]. PMH: HTN, severe aortic stenosis, nonischemic cardiomyopathy, CKD stage IIIb, schizophrenia. MBSS completed on 12/29 with recommendation to continue NPO with the exception of puree snacks. Attempted MBS repeat MBS 1/5 however she refused. Pt accepting po at bedside and repeat MBS today for possible initiation of po.   Clinical Impression Pt presents with a moderate oropharyngeal dysphagia c/b delayed swallow initiation, decreased base of tongue retraction, reduced hyolarygneal elevation and excusion, decreased UES opening diameter, and diminished sensation.  These deficits resulted in silent aspiration of thin and nectar thick liquids.  Aspiration of nectar was largely transient and occured on initial trial of nectar thick liquid only while pt was distracted.  Pt also appeared to exhibit better control of bolus flow when siphoning from straw, than when SLP administered cup sips.  Pt exhibited good airway protection on all other trials.  There was trace aspiration of thin liquid which was sensed but reflexive cough was very weak.  Penetration above the vocal folds ocurred more consistently with thin liquids and was inconsistly cleared.  There was trace, transient penetration of puree.     Recommend continuing puree diet and advancing liquid consistency to nectar/mildly thick.  DIGEST Swallow Severity Rating*  Safety: 2  Efficiency: 0  Overall Pharyngeal Swallow Severity: 2 1: mild; 2: moderate; 3: severe; 4: profound  *The Dynamic Imaging Grade of Swallowing Toxicity is standardized for the head and neck cancer population, however, demonstrates promising clinical applications across populations to standardize the clinical rating of pharyngeal swallow safety and severity.   Factors that may increase risk of adverse event in presence of aspiration Noe & Lianne 2021): Reduced cognitive function;Limited mobility  Swallow Evaluation Recommendations Recommendations: PO diet PO Diet Recommendation: Dysphagia 1 (Pureed);Mildly thick liquids (Level 2, nectar thick) Liquid Administration via: Cup;Straw Medication Administration: Crushed with puree Supervision: Staff to assist with self-feeding;Full supervision/cueing for swallowing strategies Swallowing strategies  : Slow rate;Small bites/sips;Follow solids with liquids Postural changes: Position pt fully upright for meals;Stay upright 30-60 min after meals Oral care recommendations: Oral care BID (2x/day)      Anette FORBES Grippe , MA, CCC-SLP Acute Rehabilitation Services Office: [phone]  03/21/2024,12:53 PM

## 2024-03-21 NOTE — Plan of Care (Signed)
" °  Problem: Education: Goal: Knowledge of disease or condition will improve Outcome: Adequate for Discharge Goal: Knowledge of secondary prevention will improve (MUST DOCUMENT ALL) Outcome: Adequate for Discharge Goal: Knowledge of patient specific risk factors will improve (DELETE if not current risk factor) Outcome: Adequate for Discharge   Problem: Ischemic Stroke/TIA Tissue Perfusion: Goal: Complications of ischemic stroke/TIA will be minimized Outcome: Adequate for Discharge   Problem: Coping: Goal: Will verbalize positive feelings about self Outcome: Adequate for Discharge Goal: Will identify appropriate support needs Outcome: Adequate for Discharge   Problem: Health Behavior/Discharge Planning: Goal: Ability to manage health-related needs will improve Outcome: Adequate for Discharge Goal: Goals will be collaboratively established with patient/family Outcome: Adequate for Discharge   Problem: Nutrition: Goal: Risk of aspiration will decrease Outcome: Adequate for Discharge Goal: Dietary intake will improve Outcome: Adequate for Discharge   "

## 2024-03-21 NOTE — TOC Transition Note (Signed)
 Transition of Care [facility]) - Discharge Note   Patient Details  Name: Erin Riley MRN: [id number] Date of Birth: 1951/01/27  [facility] CM/SW Contact:  [name], LCSW Phone Number: 03/21/2024, 1:28 PM   Clinical Narrative:     Shara approved 1/15-1/19 #[id number]  Per MD patient ready for DC to [facility]. RN, patient, patient's family, and facility notified of DC. Discharge Summary and FL2 sent to facility. RN to call report prior to discharge [phone]). DC packet on chart. Ambulance transport requested for patient.   CSW will sign off for now as social work intervention is no longer needed. Please consult us  again if new needs arise.   Final next level of care: Skilled Nursing Facility Barriers to Discharge: No Barriers Identified   Patient Goals and CMS Choice            Discharge Placement              Patient chooses bed at: [facility] Patient to be transferred to facility by: [facility] Name of family member notified: [name] Patient and family notified of of transfer: 03/21/24  Discharge Plan and Services Additional resources added to the After Visit Summary for     Discharge Planning Services: CM Consult Post Acute Care Choice: IP Rehab                               Social Drivers of Health (SDOH) Interventions SDOH Screenings   Food Insecurity: No Food Insecurity (03/02/2024)  Housing: Low Risk (03/02/2024)  Transportation Needs: No Transportation Needs (03/02/2024)  Utilities: Not At Risk (03/02/2024)  Alcohol Screen: Low Risk (12/20/2023)  Social Connections: Unknown (03/02/2024)  Tobacco Use: High Risk (03/01/2024)     Readmission Risk Interventions    06/14/2023   11:56 AM 09/14/2022   10:20 AM  Readmission Risk Prevention Plan  Transportation Screening Complete Complete  PCP or Specialist Appt within 3-5 Days Complete Complete  HRI or Home Care Consult Complete Complete  Social Work Consult for Recovery Care  Planning/Counseling  Complete  Palliative Care Screening Not Applicable Not Applicable  Medication Review Oceanographer) Complete Referral to Pharmacy

## 2024-03-25 ENCOUNTER — Ambulatory Visit: Admitting: Neurology

## 2024-03-25 ENCOUNTER — Encounter: Payer: Self-pay | Admitting: Neurology

## 2024-03-25 DIAGNOSIS — I6601 Occlusion and stenosis of right middle cerebral artery: Secondary | ICD-10-CM

## 2024-03-25 DIAGNOSIS — I255 Ischemic cardiomyopathy: Secondary | ICD-10-CM | POA: Diagnosis not present

## 2024-03-25 DIAGNOSIS — G8194 Hemiplegia, unspecified affecting left nondominant side: Secondary | ICD-10-CM | POA: Diagnosis not present

## 2024-03-25 NOTE — Patient Instructions (Signed)
 I had a long discussion with the patient and her caregiver from nursing facility regarding her recent embolic stroke and residual left hemiplegia and cardiomyopathy and answered questions.  The patient unfortunately cannot afford Eliquis  hence continue aspirin  for stroke prevention and maintain aggressive risk factor modification with strict control of hypertension with blood pressure goal below 130/90, LDL cholesterol goal below 70 mg percent and diabetes with hemoglobin A1c goal below 6.5%.  Continue ongoing physical occupational and speech therapy.  Return for follow-up in the future only as needed.  Stroke Prevention Some medical conditions and behaviors can lead to a higher chance of having a stroke. You can help prevent a stroke by eating healthy, exercising, not smoking, and managing any medical conditions you have. Stroke is a leading cause of functional impairment. Primary prevention is particularly important because a majority of strokes are first-time events. Stroke changes the lives of not only those who experience a stroke but also their family and other caregivers. How can this condition affect me? A stroke is a medical emergency and should be treated right away. A stroke can lead to brain damage and can sometimes be life-threatening. If a person gets medical treatment right away, there is a better chance of surviving and recovering from a stroke. What can increase my risk? The following medical conditions may increase your risk of a stroke: Cardiovascular disease. High blood pressure (hypertension). Diabetes. High cholesterol. Sickle cell disease. Blood clotting disorders (hypercoagulable state). Obesity. Sleep disorders (obstructive sleep apnea). Other risk factors include: Being older than age 40. Having a history of blood clots, stroke, or mini-stroke (transient ischemic attack, TIA). Genetic factors, such as race, ethnicity, or a family history of stroke. Smoking cigarettes or  using other tobacco products. Taking birth control pills, especially if you also use tobacco. Heavy use of alcohol or drugs, especially cocaine and methamphetamine. Physical inactivity. What actions can I take to prevent this? Manage your health conditions High cholesterol levels. Eating a healthy diet is important for preventing high cholesterol. If cholesterol cannot be managed through diet alone, you may need to take medicines. Take any prescribed medicines to control your cholesterol as told by your health care provider. Hypertension. To reduce your risk of stroke, try to keep your blood pressure below 130/80. Eating a healthy diet and exercising regularly are important for controlling blood pressure. If these steps are not enough to manage your blood pressure, you may need to take medicines. Take any prescribed medicines to control hypertension as told by your health care provider. Ask your health care provider if you should monitor your blood pressure at home. Have your blood pressure checked every year, even if your blood pressure is normal. Blood pressure increases with age and some medical conditions. Diabetes. Eating a healthy diet and exercising regularly are important parts of managing your blood sugar (glucose). If your blood sugar cannot be managed through diet and exercise, you may need to take medicines. Take any prescribed medicines to control your diabetes as told by your health care provider. Get evaluated for obstructive sleep apnea. Talk to your health care provider about getting a sleep evaluation if you snore a lot or have excessive sleepiness. Make sure that any other medical conditions you have, such as atrial fibrillation or atherosclerosis, are managed. Nutrition Follow instructions from your health care provider about what to eat or drink to help manage your health condition. These instructions may include: Reducing your daily calorie intake. Limiting how much salt  (sodium) you use  to 1,500 milligrams (mg) each day. Using only healthy fats for cooking, such as olive oil, canola oil, or sunflower oil. Eating healthy foods. You can do this by: Choosing foods that are high in fiber, such as whole grains, and fresh fruits and vegetables. Eating at least 5 servings of fruits and vegetables a day. Try to fill one-half of your plate with fruits and vegetables at each meal. Choosing lean protein foods, such as lean cuts of meat, poultry without skin, fish, tofu, beans, and nuts. Eating low-fat dairy products. Avoiding foods that are high in sodium. This can help lower blood pressure. Avoiding foods that have saturated fat, trans fat, and cholesterol. This can help prevent high cholesterol. Avoiding processed and prepared foods. Counting your daily carbohydrate intake.  Lifestyle If you drink alcohol: Limit how much you have to: 0-1 drink a day for women who are not pregnant. 0-2 drinks a day for men. Know how much alcohol is in your drink. In the [place] one drink equals one 12 oz bottle of beer ( ), one 5 oz glass of wine ( ), or one 1 oz glass of hard liquor (44mL). Do not use any products that contain nicotine  or tobacco. These products include cigarettes, chewing tobacco, and vaping devices, such as e-cigarettes. If you need help quitting, ask your health care provider. Avoid secondhand smoke. Do not use drugs. Activity  Try to stay at a healthy weight. Get at least 30 minutes of exercise on most days, such as: Fast walking. Biking. Swimming. Medicines Take over-the-counter and prescription medicines only as told by your health care provider. Aspirin  or blood thinners (antiplatelets or anticoagulants) may be recommended to reduce your risk of forming blood clots that can lead to stroke. Avoid taking birth control pills. Talk to your health care provider about the risks of taking birth control pills if: You are over 74 years old. You  smoke. You get very bad headaches. You have had a blood clot. Where to find more information American Stroke Association: www.strokeassociation.org Get help right away if: You or a loved one has any symptoms of a stroke. BE FAST is an easy way to remember the main warning signs of a stroke: B - Balance. Signs are dizziness, sudden trouble walking, or loss of balance. E - Eyes. Signs are trouble seeing or a sudden change in vision. F - Face. Signs are sudden weakness or numbness of the face, or the face or eyelid drooping on one side. A - Arms. Signs are weakness or numbness in an arm. This happens suddenly and usually on one side of the body. S - Speech. Signs are sudden trouble speaking, slurred speech, or trouble understanding what people say. T - Time. Time to call emergency services. Write down what time symptoms started. You or a loved one has other signs of a stroke, such as: A sudden, severe headache with no known cause. Nausea or vomiting. Seizure. These symptoms may represent a serious problem that is an emergency. Do not wait to see if the symptoms will go away. Get medical help right away. Call your local emergency services (911 in the [place] Do not drive yourself to the hospital. Summary You can help to prevent a stroke by eating healthy, exercising, not smoking, limiting alcohol intake, and managing any medical conditions you may have. Do not use any products that contain nicotine  or tobacco. These include cigarettes, chewing tobacco, and vaping devices, such as e-cigarettes. If you need help quitting, ask your health care provider.  Remember BE FAST for warning signs of a stroke. Get help right away if you or a loved one has any of these signs. This information is not intended to replace advice given to you by your health care provider. Make sure you discuss any questions you have with your health care provider. Document Revised: 01/23/2022 Document Reviewed:  01/23/2022 Elsevier Patient Education  2024 [employer].

## 2024-03-25 NOTE — Progress Notes (Signed)
 " [facility] [address] [place]. [place] [address] [phone]       OFFICE CONSULT NOTE  Ms. DAILEY ALBERSON Date of Birth:  09-12-1950 Medical Record Number:  [id number]   Referring MD: Ivonne Mustache  Reason for Referral: Stroke  HPI: Mr. Calame is a 74 year old African-American lady seen today for initial office consultation visit for stroke.  She is accompanied by caretaker from skilled nursing facility.  History is obtained from them and review of electronic medical record.  I personally reviewed pertinent imaging films in PACS.  She is a 74 year old African-American lady with history of coronary artery disease, chronic kidney disease stage IIIb, hypertension, hyperlipidemia, peripheral arterial disease, schizophrenia, severe aortic stenosis who presented on 03/01/2024 after being found with left-sided weakness, slurred speech and facial droop.  She was intubated for airway protection for short time and then extubated.  MRI of the brain showed a large posterior division right MCA approximately 2 mm right-to-left.MRA of the brain showed right M2 occlusion with severe left P2 and right PCA stenosis.  2D echo showed ejection fraction of 25 to 30% with severe aortic stenosis.  LDL cholesterol 106 mg percent. HbA1c 5.8.  She was transferred for ongoing therapy needs to skilled nursing facility on 03/21/2024.  Patient is at present two-person assist to stand related to his bed.  During ambulation therapy.  Speech is improved still quite weak leg appears to be improving.  Patient was recommended to take Eliquis  but co-pay was too high and she has been switched to aspirin  81 mg daily instead.  ROS:   14 system review of systems is positive for weakness, slurred speech, difficulty walking.  All other the systems negative  PMH:  Past Medical History:  Diagnosis Date   Chronic back pain    Chronic kidney disease (CKD), stage II (mild)    Class I-II   Coronary artery disease 04/2009    50% stenosis in the perforator of LAD; catheterization was for an abnormal Myoview in January 2000 showing anterior and inferolateral ischemia.   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 > 3 subsequent Echos show improved/normal EF with moderate to severe concentric LVH and diastolic dysfunction with LVOT/intracavitary gradient --> 06/2016: Severe LVH.  Vigorous EF, 65-70%.?? Gr 1 DD. Mild AS.   Hyperlipidemia    Hypertension    Hypertensive hypertrophic cardiomyopathy: NYHA class II:  Echo: Severe concentric LVH with LV OT gradient; essentially preserved EF with diastolic dysfunction 02/15/2013   Echo 06/2016: Severe Concentric LVH. Vigorous EF 65-70%. ~ Gr I DD.    Mild aortic stenosis by prior echocardiography    Echo 06/2016: Mild AS (Mean Gradient 15 mmHg); has had prior Mod-Severe MR (not seen on current echo)   PAD (peripheral artery disease) March 2013   Lower extremity Dopplers: R. SFA 50-60%, R. PTA proximally occluded with distal reconstitution;; L. common iliac ~50%, L. SFA 50-70% stenosis, L. PTA < 50%   Schizophrenia (HCC)     Social History:  Social History   Socioeconomic History   Marital status: Single    Spouse name: Not on file   Number of children: 7   Years of education: Not on file   Highest education level: Not on file  Occupational History   Not on file  Tobacco Use   Smoking status: Some Days    Current packs/day: 0.00  Types: Cigarettes    Last attempt to quit: 05/14/2002    Years since quitting: 21.8   Smokeless tobacco: Never  Vaping Use   Vaping status: Never Used  Substance and Sexual Activity   Alcohol use: No   Drug use: No   Sexual activity: Yes    Birth control/protection: Post-menopausal, None  Other Topics Concern   Not on file  Social History Narrative   Now single mother of 2 with one grandchild. She quit smoking roughly 5  years ago and is not so since. She has also stopped drinking alcohol. She does try get routine exercise walking at least a mile 3-4 days a week.    She lives with her 42 year old mother. 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   Social Drivers of Health   Tobacco Use: High Risk (03/25/2024)   Patient History    Smoking Tobacco Use: Some Days    Smokeless Tobacco Use: Never    Passive Exposure: Not on file  Financial Resource Strain: Not on file  Food Insecurity: No Food Insecurity (03/02/2024)   Epic    Worried About Programme Researcher, Broadcasting/film/video in the Last Year: Never true    Ran Out of Food in the Last Year: Never true  Transportation Needs: No Transportation Needs (03/02/2024)   Epic    Lack of Transportation (Medical): No    Lack of Transportation (Non-Medical): No  Physical Activity: Not on file  Stress: Not on file  Social Connections: Unknown (03/02/2024)   Social Connection and Isolation Panel    Frequency of Communication with Friends and Family: Three times a week    Frequency of Social Gatherings with Friends and Family: Twice a week    Attends Religious Services: 1 to 4 times per year    Active Member of [employer] or Organizations: No    Attends Banker Meetings: Never    Marital Status: Not on file  Intimate Partner Violence: Not At Risk (03/02/2024)   Epic    Fear of Current or Ex-Partner: No    Emotionally Abused: No    Physically Abused: No    Sexually Abused: No  Depression (PHQ2-9): Not on file  Alcohol Screen: Low Risk (12/20/2023)   Alcohol Screen    Last Alcohol Screening Score (AUDIT): 0  Housing: Low Risk (03/02/2024)   Epic    Unable to Pay for Housing in the Last Year: No    Number of Times Moved in the Last Year: 0    Homeless in the Last Year: No  Utilities: Not At Risk (03/02/2024)   Epic    Threatened with loss of utilities: No  Health Literacy: Not on file    Medications:  Medications Ordered Prior to Encounter[1]  Allergies:   Allergies[2]  Physical Exam General: well developed, well nourished pleasant elderly African-American lady, seated, in no evident distress Head: head normocephalic and atraumatic.   Neck: supple with no carotid or supraclavicular bruits Cardiovascular: regular rate and rhythm, no murmurs Musculoskeletal: no deformity Skin:  no rash/petichiae Vascular:  Normal pulses all extremities  Neurologic Exam Mental Status: Awake and fully alert. Oriented to place and time. Recent and remote memory poor attention span, concentration and fund of knowledge diminished mood and affect appropriate.  Speech is severely dysarthric but can be understood with some difficulty.  No aphasia. Cranial Nerves: Fundoscopic exam reveals sharp disc margins. Pupils equal, briskly reactive to light. Extraocular movements full without nystagmus. Visual fields show dense left homonymous hemianopsia to confrontation.  Hearing intact. Facial sensation intact.  Moderate left lower facial weakness., tongue, palate moves normally and symmetrically.  Motor: Spastic left hemiplegia with left upper extremity 0/5 strength with left lower extremity 3/5 strength with weakness of left hip flexors and ankle dorsiflexors.  Tone is increased on the left. Sensory.: intact to touch , pinprick , position and vibratory sensation.  Coordination: Normal on the right.  Impaired proportionate to weakness in the left.   Gait and Station:  Deferred as patient is two-person assist to even stand Reflexes: 1+ and asymmetric and brisker on the left. Toes downgoing.   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  Patient has significant residual left hemiplegia.  Vascular risk factors of hypertension, hyperlipidemia, coronary artery disease, cigarette smoking and cardiomyopathy     PLAN:I had a long discussion with  the patient and her caregiver from nursing facility regarding her recent embolic stroke and residual left hemiplegia and cardiomyopathy and answered questions.  The patient unfortunately cannot afford Eliquis  hence continue aspirin  for stroke prevention and maintain aggressive risk factor modification with strict control of hypertension with blood pressure goal below 130/90, LDL cholesterol goal below 70 mg percent and diabetes with hemoglobin A1c goal below 6.5%.  Continue ongoing physical occupational and speech therapy.  Return for follow-up in the future only as needed.   I personally spent a total of 50 minutes in the care of the patient today including getting/reviewing separately obtained history, performing a medically appropriate exam/evaluation, counseling and educating, placing orders, referring and communicating with other health care professionals, documenting clinical information in the EHR, independently interpreting results, and coordinating care.       Eather Popp, MD  Note: This document was prepared with digital dictation and possible smart phrase technology. Any transcriptional errors that result from this process are unintentional.      [1]  Current Outpatient Medications on File Prior to Visit  Medication Sig Dispense Refill   amLODipine  (NORVASC ) 10 MG tablet Take 1 tablet (10 mg total) by mouth daily. 30 tablet 0   aspirin  EC 81 MG tablet Take 1 tablet (81 mg total) by mouth daily. Swallow whole. 90 tablet 3   atorvastatin  (LIPITOR ) 80 MG tablet Take 1 tablet (80 mg total) by mouth daily.     carvedilol  (COREG ) 12.5 MG tablet Take 1 tablet (12.5 mg total) by mouth 2 (two) times daily with a meal.     docusate sodium  (COLACE) 100 MG capsule Take 1 capsule (100 mg total) by mouth 2 (two) times daily.     ferrous sulfate  325 (65 FE) MG tablet Take 1 tablet (325 mg total) by mouth daily with breakfast. 100 tablet 0   losartan  (COZAAR ) 25 MG tablet Take 25 mg by mouth daily.      nicotine  (NICODERM CQ  - DOSED IN MG/24 HOURS) 14 mg/24hr patch Place 1 patch (14 mg total) onto the skin daily.     OLANZapine  (ZYPREXA ) 5 MG tablet Take 1 tablet (5 mg total) by mouth at bedtime.     polyethylene glycol (MIRALAX  / GLYCOLAX ) 17 g packet Take 17 g by mouth daily.     No current facility-administered medications on file prior to visit.  [2] No Known Allergies  "

## 2024-04-10 ENCOUNTER — Encounter: Payer: Self-pay | Admitting: *Deleted

## 2024-04-10 NOTE — Progress Notes (Signed)
 Received MAR from [facility] dated 03/25/24 (date pt was seen at [facility]). I abstracted chart to add health history. MAR at facility does not have Losartan  on it.
